# Patient Record
Sex: Male | Born: 1965
Health system: Southern US, Community
[De-identification: ages and names within clinical notes are randomized; demographics above are authoritative.]

## PROBLEM LIST (undated history)

## (undated) ENCOUNTER — Inpatient Hospital Stay: Admission: EM | Payer: Self-pay | Source: Home / Self Care

## (undated) ENCOUNTER — Emergency Department (HOSPITAL_COMMUNITY): Discharge status: Absent without leave | Payer: Medicaid Other

## (undated) DIAGNOSIS — E782 Mixed hyperlipidemia: Secondary | ICD-10-CM

## (undated) DIAGNOSIS — R233 Spontaneous ecchymoses: Secondary | ICD-10-CM

## (undated) DIAGNOSIS — E119 Type 2 diabetes mellitus without complications: Secondary | ICD-10-CM

## (undated) DIAGNOSIS — R202 Paresthesia of skin: Secondary | ICD-10-CM

## (undated) DIAGNOSIS — I5022 Chronic systolic (congestive) heart failure: Secondary | ICD-10-CM

## (undated) DIAGNOSIS — K602 Anal fissure, unspecified: Secondary | ICD-10-CM

## (undated) DIAGNOSIS — I219 Acute myocardial infarction, unspecified: Secondary | ICD-10-CM

## (undated) DIAGNOSIS — I739 Peripheral vascular disease, unspecified: Secondary | ICD-10-CM

## (undated) DIAGNOSIS — I10 Essential (primary) hypertension: Secondary | ICD-10-CM

## (undated) DIAGNOSIS — Z972 Presence of dental prosthetic device (complete) (partial): Secondary | ICD-10-CM

## (undated) DIAGNOSIS — I255 Ischemic cardiomyopathy: Secondary | ICD-10-CM

## (undated) DIAGNOSIS — I2510 Atherosclerotic heart disease of native coronary artery without angina pectoris: Secondary | ICD-10-CM

## (undated) DIAGNOSIS — K219 Gastro-esophageal reflux disease without esophagitis: Secondary | ICD-10-CM

## (undated) DIAGNOSIS — R200 Anesthesia of skin: Secondary | ICD-10-CM

## (undated) DIAGNOSIS — M1990 Unspecified osteoarthritis, unspecified site: Secondary | ICD-10-CM

## (undated) DIAGNOSIS — I429 Cardiomyopathy, unspecified: Secondary | ICD-10-CM

## (undated) DIAGNOSIS — Z9289 Personal history of other medical treatment: Secondary | ICD-10-CM

## (undated) DIAGNOSIS — K621 Rectal polyp: Secondary | ICD-10-CM

## (undated) DIAGNOSIS — I70213 Atherosclerosis of native arteries of extremities with intermittent claudication, bilateral legs: Secondary | ICD-10-CM

## (undated) DIAGNOSIS — I2109 ST elevation (STEMI) myocardial infarction involving other coronary artery of anterior wall: Secondary | ICD-10-CM

## (undated) DIAGNOSIS — I7092 Chronic total occlusion of artery of the extremities: Secondary | ICD-10-CM

## (undated) DIAGNOSIS — Z87442 Personal history of urinary calculi: Secondary | ICD-10-CM

## (undated) DIAGNOSIS — E1165 Type 2 diabetes mellitus with hyperglycemia: Secondary | ICD-10-CM

## (undated) DIAGNOSIS — R238 Other skin changes: Secondary | ICD-10-CM

## (undated) DIAGNOSIS — G458 Other transient cerebral ischemic attacks and related syndromes: Secondary | ICD-10-CM

## (undated) DIAGNOSIS — I6529 Occlusion and stenosis of unspecified carotid artery: Secondary | ICD-10-CM

## (undated) DIAGNOSIS — I200 Unstable angina: Secondary | ICD-10-CM

## (undated) HISTORY — DX: Chronic systolic (congestive) heart failure: I50.22

## (undated) HISTORY — DX: Atherosclerotic heart disease of native coronary artery without angina pectoris: I25.10

## (undated) HISTORY — DX: Essential (primary) hypertension: I10

## (undated) HISTORY — DX: Paresthesia of skin: R20.2

## (undated) HISTORY — DX: Rectal polyp: K62.1

## (undated) HISTORY — PX: APPENDECTOMY: SHX54

## (undated) HISTORY — DX: Acute myocardial infarction, unspecified: I21.9

## (undated) HISTORY — DX: Chronic total occlusion of artery of the extremities: I70.92

## (undated) HISTORY — DX: Mixed hyperlipidemia: E78.2

## (undated) HISTORY — DX: Cardiomyopathy, unspecified: I42.9

## (undated) HISTORY — DX: ST elevation (STEMI) myocardial infarction involving other coronary artery of anterior wall: I21.09

## (undated) HISTORY — DX: Anesthesia of skin: R20.0

## (undated) HISTORY — DX: Peripheral vascular disease, unspecified: I73.9

## (undated) HISTORY — PX: ANAL FISSURECTOMY: SUR608

## (undated) HISTORY — DX: Anal fissure, unspecified: K60.2

## (undated) HISTORY — PX: DIAGNOSTIC LAPAROSCOPY: SUR761

## (undated) HISTORY — DX: Occlusion and stenosis of unspecified carotid artery: I65.29

## (undated) HISTORY — DX: Atherosclerosis of native arteries of extremities with intermittent claudication, bilateral legs: I70.213

## (undated) HISTORY — PX: COLONOSCOPY: SHX174

## (undated) HISTORY — DX: Other transient cerebral ischemic attacks and related syndromes: G45.8

## (undated) HISTORY — PX: CORONARY ANGIOPLASTY: SHX604

## (undated) HISTORY — DX: Type 2 diabetes mellitus with hyperglycemia: E11.65

---

## 2002-02-19 ENCOUNTER — Emergency Department (HOSPITAL_COMMUNITY): Admission: EM | Admit: 2002-02-19 | Discharge: 2002-02-19 | Payer: Self-pay | Admitting: Internal Medicine

## 2003-06-21 ENCOUNTER — Emergency Department (HOSPITAL_COMMUNITY): Admission: EM | Admit: 2003-06-21 | Discharge: 2003-06-21 | Payer: Self-pay | Admitting: Emergency Medicine

## 2003-06-21 ENCOUNTER — Encounter: Payer: Self-pay | Admitting: Emergency Medicine

## 2007-01-06 ENCOUNTER — Emergency Department (HOSPITAL_COMMUNITY): Admission: EM | Admit: 2007-01-06 | Discharge: 2007-01-06 | Payer: Self-pay | Admitting: Emergency Medicine

## 2007-01-11 ENCOUNTER — Emergency Department (HOSPITAL_COMMUNITY): Admission: EM | Admit: 2007-01-11 | Discharge: 2007-01-12 | Payer: Self-pay | Admitting: Emergency Medicine

## 2007-01-20 ENCOUNTER — Emergency Department (HOSPITAL_COMMUNITY): Admission: EM | Admit: 2007-01-20 | Discharge: 2007-01-20 | Payer: Self-pay | Admitting: Emergency Medicine

## 2008-07-12 ENCOUNTER — Emergency Department (HOSPITAL_COMMUNITY): Admission: EM | Admit: 2008-07-12 | Discharge: 2008-07-13 | Payer: Self-pay | Admitting: Emergency Medicine

## 2008-10-06 ENCOUNTER — Emergency Department (HOSPITAL_COMMUNITY): Admission: EM | Admit: 2008-10-06 | Discharge: 2008-10-06 | Payer: Self-pay | Admitting: Emergency Medicine

## 2010-05-24 ENCOUNTER — Emergency Department (HOSPITAL_COMMUNITY): Admission: EM | Admit: 2010-05-24 | Discharge: 2010-05-24 | Payer: Self-pay | Admitting: Emergency Medicine

## 2010-07-08 ENCOUNTER — Emergency Department (HOSPITAL_COMMUNITY): Admission: EM | Admit: 2010-07-08 | Discharge: 2010-07-08 | Payer: Self-pay | Admitting: Emergency Medicine

## 2010-07-21 ENCOUNTER — Ambulatory Visit: Payer: Self-pay | Admitting: Internal Medicine

## 2010-07-22 ENCOUNTER — Encounter: Payer: Self-pay | Admitting: Internal Medicine

## 2010-07-25 ENCOUNTER — Ambulatory Visit (HOSPITAL_COMMUNITY): Admission: RE | Admit: 2010-07-25 | Discharge: 2010-07-25 | Payer: Self-pay | Admitting: Internal Medicine

## 2010-07-25 ENCOUNTER — Ambulatory Visit: Payer: Self-pay | Admitting: Internal Medicine

## 2010-07-27 ENCOUNTER — Telehealth (INDEPENDENT_AMBULATORY_CARE_PROVIDER_SITE_OTHER): Payer: Self-pay

## 2010-07-29 ENCOUNTER — Encounter: Payer: Self-pay | Admitting: Internal Medicine

## 2010-08-03 ENCOUNTER — Encounter: Payer: Self-pay | Admitting: Internal Medicine

## 2010-08-05 ENCOUNTER — Ambulatory Visit (HOSPITAL_COMMUNITY): Admission: RE | Admit: 2010-08-05 | Discharge: 2010-08-05 | Payer: Self-pay | Admitting: General Surgery

## 2010-08-24 ENCOUNTER — Encounter: Payer: Self-pay | Admitting: Internal Medicine

## 2011-01-03 NOTE — Letter (Signed)
Summary: HISTORY & PHYSICAL  HISTORY & PHYSICAL   Imported By: Rexene Alberts 08/03/2010 15:05:09  _____________________________________________________________________  External Attachment:    Type:   Image     Comment:   External Document

## 2011-01-03 NOTE — Assessment & Plan Note (Signed)
Summary: RECTAL BLEEDING & HEMORRHOIDS/LAW   Visit Type:  Initial Consult Referring Provider:  Dimas Aguas Primary Care Provider:  [facility]  Chief Complaint:  rectal bleeding/hemorrhoids.  History of Present Illness: Mr. Ryan [name] is a pleasant 45 year old Caucasian gentleman, who presents today at request of [facility]. [facility], for further evaluation of rectal bleeding and hemorrhoids.  C/O severe anorectal pain for 6 months. Starts daily after have BM. Pain lasts all day long. Hard to sit down. Been to ED. Tried several creams that don't help. Has taken stool softners. Never has constipation. BM every AM. C/O tenesmus. No abd pain. Pain so severe feels nauseated. No vomiting. No heartburn. No dysphagia. Appetite good. Has been on prednisone for back. On diclofenac for awhile. Was on vicodin for pain but now out. Tried lidocaine/hydrocortisone without improvement. Takes 3-4 Sitz baths daily without relief.  DRE at HD showed hemorrhoids.  Current Medications (verified): 1)  Prednisone .... As Directed 2)  Diclofenac Sodium 75 Mg Tbec (Diclofenac Sodium) .... Take 1 Tablet By Mouth Two Times A Day 3)  Tramadol Hcl 50 Mg Tabs (Tramadol Hcl) .... Two Tablets Two To Three Times A Day 4)  Zanaflex 4 Mg Tabs (Tizanidine Hcl) .... Take 1 Tablet By Mouth Two Times A Day  Allergies (verified): No Known Drug Allergies  Past History:  Past Medical History: Chronic back pain  Past Surgical History: Appendectomy  Family History: Father, age 63, deceased lung cancer Maternal uncle, colon cancer, in 102s No FH liver disease.  Social History: Divorced. One child. Unemployed. 3/4ppd. Occasional alcohol. No drugs.   Review of Systems General:  Denies fever, chills, sweats, anorexia, fatigue, weakness, and weight loss. Eyes:  Denies vision loss. ENT:  Denies nasal congestion, sore throat, hoarseness, and difficulty swallowing. CV:  Denies  chest pains, angina, palpitations, dyspnea on exertion, and peripheral edema. Resp:  Denies dyspnea at rest, dyspnea with exercise, cough, sputum, and wheezing. GI:  See HPI. GU:  Denies urinary burning and blood in urine. MS:  Complains of low back pain. Derm:  Denies rash and itching. Neuro:  Denies weakness, frequent headaches, memory loss, and confusion. Psych:  Denies depression and anxiety. Endo:  Denies unusual weight change. Heme:  Denies bruising and bleeding. Allergy:  Denies hives and rash.  Vital Signs:  Patient profile:   45 year old male Height:      69 inches Weight:      200 pounds BMI:     29.64 Temp:     98.8 degrees F oral Pulse rate:   72 / minute BP sitting:   122 / 80  (left arm) Cuff size:   regular  Vitals Entered By: Cloria Spring LPN (July 21, 2010 2:00 PM)  Physical Exam  General:  Well developed, well nourished, no acute distress. Head:  Normocephalic and atraumatic. Eyes:  Conjunctivae pink, no scleral icterus.  Mouth:  Oropharyngeal mucosa moist, pink.  No lesions, erythema or exudate.    Neck:  Supple; no masses or thyromegaly. Lungs:  Clear throughout to auscultation. Heart:  Regular rate and rhythm; no murmurs, rubs,  or bruits. Abdomen:  Bowel sounds normal.  Abdomen is soft, nontender, nondistended.  No rebound or guarding.  No hepatosplenomegaly, masses or hernias.  No abdominal bruits.  Rectal:  deferred until time of colonoscopy.   Extremities:  No clubbing, cyanosis, edema or deformities noted. Neurologic:  Alert and  oriented x4;  grossly normal neurologically. Skin:  Intact without significant  lesions or rashes. Cervical Nodes:  No significant cervical adenopathy. Psych:  Alert and cooperative. Normal mood and affect.  Impression & Recommendations:  Problem # 1:  RECTAL PAIN (UVO-536.64)  Rectal pain associated with hematachezia for last six months. Suspect anorectal fissure. Other etiologies need to be excluded. Colonoscopy to  be performed in near future.  Risks, alternatives, and benefits including but not limited to the risk of reaction to medication, bleeding, infection, and perforation were addressed.  Patient voiced understanding and provided verbal consent.   Orders: Consultation Level III (40347) I would like to thank Dr. [facility] for allowing [place] to take part in the care of this nice patient.

## 2011-01-03 NOTE — Letter (Signed)
Summary: OFFICE NOTES FROM [facility]  OFFICE NOTES FROM [facility]   Imported By: Rexene Alberts 08/24/2010 15:33:54  _____________________________________________________________________  External Attachment:    Type:   Image     Comment:   External Document

## 2011-01-03 NOTE — Letter (Signed)
Summary: Internal Other /Orders for TCS  Internal Other /Orders for TCS   Imported By: Cloria Spring LPN 98/10/9146 82:95:62  _____________________________________________________________________  External Attachment:    Type:   Image     Comment:   External Document

## 2011-01-03 NOTE — Letter (Signed)
Summary: Patient Notice, Colon Biopsy Results  [facility]  [address]   [place], [place] [address]   Phone: [phone]  Fax: [phone]       July 29, 2010   Ryan [name] [address] [place], [place]  [address] 01-Jun-1966    Dear [name],  I am pleased to inform you that the biopsies taken during your recent colonoscopy did not show any evidence of cancer upon pathologic examination.  Additional information/recommendations:  Continue with the treatment plan as outlined on the day of your exam.  You should have a repeat colonoscopy examination  in 10 years.  Please call us if you are having persistent problems or have questions about your condition that have not been fully answered at this time.  Sincerely,    R. Roetta Sessions MD, FACP [facility] Ph: [phone]    Fax: [phone]   Appended Document: Patient Notice, Colon Biopsy Results letter mailed to pt  Appended Document: Patient Notice, Colon Biopsy Results reminder in computer

## 2011-01-03 NOTE — Progress Notes (Signed)
Summary: change ana-mantle  Phone Note From Pharmacy   Caller: [facility] Summary of Call: pharmacy called- they are not making ana-mantle and they need an alternative. please advise Initial call taken by: Hendricks Limes LPN,  July 27, 2010 1:02 PM     Appended Document: change ana-mantle any generic lidocaine /hydrocortisone cream will be sufficient.  Appended Document: change ana-mantle left message for pharmacy

## 2011-02-17 LAB — CBC
HCT: 43.2 % (ref 39.0–52.0)
Hemoglobin: 14.8 g/dL (ref 13.0–17.0)
MCHC: 34.3 g/dL (ref 30.0–36.0)
MCV: 96.2 fL (ref 78.0–100.0)
RBC: 4.49 MIL/uL (ref 4.22–5.81)
WBC: 8.3 10*3/uL (ref 4.0–10.5)

## 2011-02-17 LAB — SURGICAL PCR SCREEN
MRSA, PCR: NEGATIVE
Staphylococcus aureus: NEGATIVE

## 2011-02-17 LAB — BASIC METABOLIC PANEL
GFR calc Af Amer: >60 mL/min (ref >60)
GFR calc non Af Amer: >60 mL/min (ref >60)
Glucose, Bld: 146 mg/dL — ABNORMAL HIGH (ref 70–99)
Potassium: 4 mEq/L (ref 3.5–5.1)
Sodium: 136 mEq/L (ref 135–145)

## 2011-12-12 ENCOUNTER — Inpatient Hospital Stay (HOSPITAL_COMMUNITY)
Admission: EM | Admit: 2011-12-12 | Discharge: 2011-12-16 | DRG: 246 | Disposition: A | Discharge status: Home / Self Care | Payer: Medicaid Other | Source: Ambulatory Visit | Attending: Cardiovascular Disease | Admitting: Cardiovascular Disease

## 2011-12-12 ENCOUNTER — Other Ambulatory Visit: Payer: Self-pay

## 2011-12-12 ENCOUNTER — Encounter (HOSPITAL_COMMUNITY)
Admission: EM | Disposition: A | Discharge status: Home / Self Care | Payer: Self-pay | Source: Ambulatory Visit | Attending: Cardiovascular Disease

## 2011-12-12 DIAGNOSIS — I214 Non-ST elevation (NSTEMI) myocardial infarction: Principal | ICD-10-CM | POA: Diagnosis present

## 2011-12-12 DIAGNOSIS — I4901 Ventricular fibrillation: Secondary | ICD-10-CM | POA: Diagnosis present

## 2011-12-12 DIAGNOSIS — I2510 Atherosclerotic heart disease of native coronary artery without angina pectoris: Secondary | ICD-10-CM | POA: Insufficient documentation

## 2011-12-12 DIAGNOSIS — Z720 Tobacco use: Secondary | ICD-10-CM | POA: Insufficient documentation

## 2011-12-12 DIAGNOSIS — I2589 Other forms of chronic ischemic heart disease: Secondary | ICD-10-CM | POA: Diagnosis present

## 2011-12-12 DIAGNOSIS — I472 Ventricular tachycardia, unspecified: Secondary | ICD-10-CM | POA: Diagnosis present

## 2011-12-12 DIAGNOSIS — F17200 Nicotine dependence, unspecified, uncomplicated: Secondary | ICD-10-CM | POA: Diagnosis present

## 2011-12-12 DIAGNOSIS — E782 Mixed hyperlipidemia: Secondary | ICD-10-CM

## 2011-12-12 DIAGNOSIS — I2582 Chronic total occlusion of coronary artery: Secondary | ICD-10-CM | POA: Diagnosis present

## 2011-12-12 DIAGNOSIS — I2109 ST elevation (STEMI) myocardial infarction involving other coronary artery of anterior wall: Secondary | ICD-10-CM

## 2011-12-12 DIAGNOSIS — E785 Hyperlipidemia, unspecified: Secondary | ICD-10-CM | POA: Diagnosis present

## 2011-12-12 DIAGNOSIS — E118 Type 2 diabetes mellitus with unspecified complications: Secondary | ICD-10-CM

## 2011-12-12 DIAGNOSIS — I4729 Other ventricular tachycardia: Secondary | ICD-10-CM | POA: Diagnosis present

## 2011-12-12 DIAGNOSIS — I469 Cardiac arrest, cause unspecified: Secondary | ICD-10-CM | POA: Diagnosis present

## 2011-12-12 DIAGNOSIS — E876 Hypokalemia: Secondary | ICD-10-CM | POA: Diagnosis present

## 2011-12-12 DIAGNOSIS — I213 ST elevation (STEMI) myocardial infarction of unspecified site: Secondary | ICD-10-CM

## 2011-12-12 DIAGNOSIS — E1169 Type 2 diabetes mellitus with other specified complication: Secondary | ICD-10-CM

## 2011-12-12 DIAGNOSIS — I519 Heart disease, unspecified: Secondary | ICD-10-CM

## 2011-12-12 HISTORY — PX: LEFT HEART CATHETERIZATION WITH CORONARY ANGIOGRAM: SHX5451

## 2011-12-12 HISTORY — DX: Atherosclerotic heart disease of native coronary artery without angina pectoris: I25.10

## 2011-12-12 HISTORY — DX: Gastro-esophageal reflux disease without esophagitis: K21.9

## 2011-12-12 HISTORY — DX: ST elevation (STEMI) myocardial infarction involving other coronary artery of anterior wall: I21.09

## 2011-12-12 HISTORY — PX: PERCUTANEOUS CORONARY STENT INTERVENTION (PCI-S): SHX5485

## 2011-12-12 LAB — CBC
MCH: 33.8 pg (ref 26.0–34.0)
MCHC: 35.5 g/dL (ref 30.0–36.0)
MCV: 95.2 fL (ref 78.0–100.0)
Platelets: 277 10*3/uL (ref 150–400)
RDW: 12.9 % (ref 11.5–15.5)

## 2011-12-12 LAB — CARDIAC PANEL(CRET KIN+CKTOT+MB+TROPI)
CK, MB: 156.9 ng/mL (ref 0.3–4.0)
Troponin I: >25 ng/mL (ref <0.30)

## 2011-12-12 LAB — POCT I-STAT, CHEM 8
Calcium, Ion: 1.21 mmol/L (ref 1.12–1.32)
HCT: 50 % (ref 39.0–52.0)
TCO2: 28 mmol/L (ref 0–100)

## 2011-12-12 LAB — TROPONIN I: Troponin I: <0.3 ng/mL (ref <0.30)

## 2011-12-12 LAB — BASIC METABOLIC PANEL
BUN: 13 mg/dL (ref 6–23)
CO2: 25 mEq/L (ref 19–32)
Calcium: 11.4 mg/dL — ABNORMAL HIGH (ref 8.4–10.5)
Chloride: 101 mEq/L (ref 96–112)
Creatinine, Ser: 1.08 mg/dL (ref 0.50–1.35)
GFR calc Af Amer: >90 mL/min (ref >90)
Glucose, Bld: 119 mg/dL — ABNORMAL HIGH (ref 70–99)
Potassium: 3.9 mEq/L (ref 3.5–5.1)
Sodium: 138 mEq/L (ref 135–145)

## 2011-12-12 LAB — DIFFERENTIAL
Eosinophils Absolute: 0.4 10*3/uL (ref 0.0–0.7)
Lymphs Abs: 4.5 10*3/uL — ABNORMAL HIGH (ref 0.7–4.0)
Monocytes Absolute: 0.8 10*3/uL (ref 0.1–1.0)
Neutrophils Relative %: 45 % (ref 43–77)

## 2011-12-12 LAB — POCT ACTIVATED CLOTTING TIME: Activated Clotting Time: 303 seconds

## 2011-12-12 LAB — POCT I-STAT TROPONIN I: Troponin i, poc: 0.06 ng/mL (ref 0.00–0.08)

## 2011-12-12 LAB — APTT: aPTT: 26 seconds (ref 24–37)

## 2011-12-12 LAB — GLUCOSE, CAPILLARY: Glucose-Capillary: 210 mg/dL — ABNORMAL HIGH (ref 70–99)

## 2011-12-12 LAB — MAGNESIUM: Magnesium: 1.7 mg/dL (ref 1.5–2.5)

## 2011-12-12 SURGERY — LEFT HEART CATHETERIZATION WITH CORONARY ANGIOGRAM
Anesthesia: LOCAL | Site: Hand | Laterality: Right

## 2011-12-12 MED ORDER — SODIUM CHLORIDE 0.9 % IV SOLN: INTRAVENOUS | Status: AC

## 2011-12-12 MED ORDER — PANTOPRAZOLE SODIUM 40 MG PO TBEC: 40.0000 mg | DELAYED_RELEASE_TABLET | Freq: Every day | ORAL | Status: DC

## 2011-12-12 MED ORDER — ONDANSETRON HCL 4 MG/2ML IJ SOLN
4.0000 mg | Freq: Four times a day (QID) | INTRAMUSCULAR | Status: DC | PRN
  Administered 2011-12-12 – 2011-12-13 (×2): 4 mg via INTRAVENOUS
  Filled 2011-12-12 (×2): qty 2

## 2011-12-12 MED ORDER — MORPHINE SULFATE 4 MG/ML IJ SOLN
INTRAMUSCULAR | Status: AC
  Administered 2011-12-12: 14:00:00
  Filled 2011-12-12: qty 1

## 2011-12-12 MED ORDER — ACETAMINOPHEN 325 MG PO TABS: 650.0000 mg | ORAL_TABLET | ORAL | Status: DC | PRN

## 2011-12-12 MED ORDER — EPINEPHRINE HCL 0.1 MG/ML IJ SOLN
INTRAMUSCULAR | Status: AC | PRN
  Administered 2011-12-12: 1 mg via INTRAVENOUS

## 2011-12-12 MED ORDER — NITROGLYCERIN 0.4 MG SL SUBL: 0.4000 mg | SUBLINGUAL_TABLET | SUBLINGUAL | Status: DC | PRN

## 2011-12-12 MED ORDER — POTASSIUM CHLORIDE CRYS ER 20 MEQ PO TBCR
40.0000 meq | EXTENDED_RELEASE_TABLET | Freq: Once | ORAL | Status: AC
  Administered 2011-12-12: 40 meq via ORAL
  Filled 2011-12-12: qty 2

## 2011-12-12 MED ORDER — METOPROLOL TARTRATE 25 MG PO TABS
25.0000 mg | ORAL_TABLET | Freq: Two times a day (BID) | ORAL | Status: DC
  Administered 2011-12-12: 25 mg via ORAL
  Filled 2011-12-12 (×3): qty 1

## 2011-12-12 MED ORDER — SODIUM CHLORIDE 0.9 % IV SOLN: 250.0000 mL | INTRAVENOUS | Status: DC | PRN

## 2011-12-12 MED ORDER — PANTOPRAZOLE SODIUM 40 MG PO TBEC
40.0000 mg | DELAYED_RELEASE_TABLET | Freq: Every day | ORAL | Status: AC
  Administered 2011-12-12: 40 mg via ORAL
  Filled 2011-12-12: qty 1

## 2011-12-12 MED ORDER — CARVEDILOL 3.125 MG PO TABS
3.1250 mg | ORAL_TABLET | Freq: Two times a day (BID) | ORAL | Status: DC
  Filled 2011-12-12: qty 1

## 2011-12-12 MED ORDER — PRASUGREL HCL 10 MG PO TABS
10.0000 mg | ORAL_TABLET | Freq: Every day | ORAL | Status: DC
  Administered 2011-12-13 – 2011-12-14 (×2): 10 mg via ORAL
  Filled 2011-12-12 (×2): qty 1

## 2011-12-12 MED ORDER — ACETAMINOPHEN 325 MG PO TABS
650.0000 mg | ORAL_TABLET | ORAL | Status: DC | PRN
  Administered 2011-12-15: 650 mg via ORAL
  Filled 2011-12-12: qty 2

## 2011-12-12 MED ORDER — ZOLPIDEM TARTRATE 5 MG PO TABS: 10.0000 mg | ORAL_TABLET | Freq: Every evening | ORAL | Status: DC | PRN

## 2011-12-12 MED ORDER — ZOLPIDEM TARTRATE 5 MG PO TABS
10.0000 mg | ORAL_TABLET | Freq: Every evening | ORAL | Status: DC | PRN
Start: 2011-12-12 — End: 2011-12-16

## 2011-12-12 MED ORDER — ASPIRIN 81 MG PO CHEW
CHEWABLE_TABLET | ORAL | Status: AC
  Administered 2011-12-12: 162 mg
  Filled 2011-12-12: qty 2

## 2011-12-12 MED ORDER — OXYCODONE-ACETAMINOPHEN 5-325 MG PO TABS
1.0000 | ORAL_TABLET | ORAL | Status: DC | PRN
  Administered 2011-12-12 – 2011-12-14 (×8): 2 via ORAL
  Filled 2011-12-12 (×8): qty 2

## 2011-12-12 MED ORDER — SODIUM CHLORIDE 0.9 % IJ SOLN: 3.0000 mL | INTRAMUSCULAR | Status: DC | PRN

## 2011-12-12 MED ORDER — CAPTOPRIL 6.25 MG HALF TABLET
6.2500 mg | ORAL_TABLET | Freq: Three times a day (TID) | ORAL | Status: DC
  Administered 2011-12-12: 6.25 mg via ORAL
  Filled 2011-12-12 (×4): qty 1

## 2011-12-12 MED ORDER — NITROGLYCERIN IN D5W 200-5 MCG/ML-% IV SOLN
INTRAVENOUS | Status: AC
  Administered 2011-12-12: 16:00:00
  Filled 2011-12-12: qty 250

## 2011-12-12 MED ORDER — ALPRAZOLAM 0.25 MG PO TABS
0.2500 mg | ORAL_TABLET | Freq: Two times a day (BID) | ORAL | Status: DC | PRN
  Administered 2011-12-13 (×2): 0.25 mg via ORAL
  Filled 2011-12-12 (×2): qty 1

## 2011-12-12 MED ORDER — DEXTROSE 5 % IV SOLN
200.0000 mg | INTRAVENOUS | Status: AC | PRN
  Administered 2011-12-12: 200 mg via INTRAVENOUS

## 2011-12-12 MED ORDER — SODIUM CHLORIDE 0.9 % IJ SOLN
3.0000 mL | Freq: Two times a day (BID) | INTRAMUSCULAR | Status: DC
  Administered 2011-12-12 – 2011-12-16 (×8): 3 mL via INTRAVENOUS

## 2011-12-12 MED ORDER — SODIUM CHLORIDE 0.9 % IV SOLN
0.2500 mg/kg/h | INTRAVENOUS | Status: AC
  Filled 2011-12-12: qty 250

## 2011-12-12 MED ORDER — ROSUVASTATIN CALCIUM 40 MG PO TABS
40.0000 mg | ORAL_TABLET | Freq: Every day | ORAL | Status: DC
  Administered 2011-12-13 – 2011-12-14 (×2): 40 mg via ORAL
  Filled 2011-12-12 (×2): qty 1

## 2011-12-12 MED ORDER — LISINOPRIL 5 MG PO TABS
5.0000 mg | ORAL_TABLET | Freq: Every day | ORAL | Status: DC
  Administered 2011-12-12: 5 mg via ORAL
  Filled 2011-12-12 (×2): qty 1

## 2011-12-12 MED ORDER — ASPIRIN 81 MG PO CHEW
81.0000 mg | CHEWABLE_TABLET | Freq: Every day | ORAL | Status: DC
  Administered 2011-12-12 – 2011-12-14 (×3): 81 mg via ORAL
  Filled 2011-12-12 (×3): qty 1

## 2011-12-12 MED ORDER — ASPIRIN EC 81 MG PO TBEC: 81.0000 mg | DELAYED_RELEASE_TABLET | Freq: Every day | ORAL | Status: DC

## 2011-12-12 MED ORDER — ROSUVASTATIN CALCIUM 40 MG PO TABS
40.0000 mg | ORAL_TABLET | Freq: Every day | ORAL | Status: DC
  Filled 2011-12-12: qty 1

## 2011-12-12 MED ORDER — ONDANSETRON HCL 4 MG/2ML IJ SOLN: 4.0000 mg | Freq: Four times a day (QID) | INTRAMUSCULAR | Status: DC | PRN

## 2011-12-12 NOTE — H&P (Signed)
Patient ID: Ryan [name] MRN: [id number], DOB/AGE: 46-Dec-1967   Admit date: 12/12/2011   Primary Cardiologist: New  Pt. Profile:   46 y/o male w/o prior cardiac hx who presented on transfer from [facility] with acute Ant STEMI.  Problem List: Past Medical History  Diagnosis Date  . CAD (coronary artery disease)     A. Acute Ant STEMI 12/12/2011  . Tobacco abuse     approx 25-50 pack years  . Torsade de pointes     A. 12/17/2011 in setting of Acute Ant Stemi     Allergies: No Known Allergies  HPI:   46 y/o w/o prior medical hx who over the past month has been experiencing exertional c/p, most days of the week, lasting 10-15 mins, and resolving with rest.  Pt works night shift and awoke from sleep this afternoon at 3pm with severe sscp and left arm pain with sob and diaphoresis.  His girlfriend took him to [facility] ER, where he was found to have Ant ST elevation.  Code STEMI was called and arrangements were made for transfer to [facility] for emergent cath.  While still in ER, pt had VT/Torsades, and was treated 200mg  of IV amiodarone and defibrillated, back into sinus rhythm.  Pt was then taken via carelink to the [facility] cath lab where he cont to have 10/10 c/p and sob.   Home Medications Medications Prior to Admission  Medication Dose Route Frequency Provider Last Rate Last Dose  . amiodarone (CORDARONE) 200 mg in dextrose 5 % 100 mL bolus  200 mg Intravenous PRN Flint Melter, MD   200 mg at 12/12/11 1543  . aspirin 81 MG chewable tablet        162 mg at 12/12/11 1601  . EPINEPHrine (ADRENALIN) 0.1 MG/ML injection   Intravenous PRN Flint Melter, MD   1 mg at 12/12/11 1540  . morphine 4 MG/ML injection           . nitroGLYCERIN 0.2 mg/mL in dextrose 5 % infusion            No current outpatient prescriptions on file as of 12/12/2011.     FH Unable to obtain complete family history 2/2 acuity.  History   Social History  . Marital Status: Divorced    Spouse Name: N/A    Number of  Children: N/A  . Years of Education: N/A   Occupational History  .  Other   Social History Main Topics  . Smoking status: Current Everyday Smoker -- 1.5 packs/day for 25 years    Types: Cigarettes  . Smokeless tobacco: Never Used  . Alcohol Use: Yes     drinks a few beers on weekends.  . Drug Use: No  . Sexually Active: Not on file   Other Topics Concern  . Not on file   Social History Narrative   [employer] - physical labor  - works night shift     Review of Systems: General: negative for chills, fever, night sweats or weight changes.  Cardiovascular+++ for chest pain, dyspnea on exertion, and diaphoresis.  No edema, orthopnea, palpitations, paroxysmal nocturnal dyspnea. Dermatological: negative for rash Respiratory: negative for cough or wheezing Urologic: negative for hematuria Abdominal: negative for nausea, vomiting, diarrhea, bright red blood per rectum, melena, or hematemesis Neurologic: negative for visual changes, syncope, or dizziness All other systems reviewed and are otherwise negative except as noted above.  Physical Exam: Blood pressure 197/102, pulse 27, temperature 98.6 F (37  C), temperature source Oral, resp. rate 15, SpO2 93.00%.  General: Well developed, well nourished, in no acute distress. Head: Normocephalic, atraumatic, sclera non-icteric, no xanthomas, nares are without discharge.  Neck: Supple without bruits or JVD. Lungs:  Resp regular and unlabored, CTA. Heart: RRR no s3, s4, or murmurs. Abdomen: Soft, non-tender, non-distended, BS + x 4.  Msk:  Strength and tone appears normal for age. Extremities: No clubbing, cyanosis or edema. DP/PT/Radials 1+ and equal bilaterally. Neuro: Alert and oriented X 3. Moves all extremities spontaneously. Psych: Normal affect.   Labs:   Results for orders placed during the hospital encounter of 12/12/11 (from the past 72 hour(s))  POCT I-STAT TROPONIN I     Status: Normal   Collection Time    12/12/11  3:48 PM      Component Value Range Comment   Troponin i, poc 0.06  0.00 - 0.08 (ng/mL)    Comment 3            CBC     Status: Abnormal   Collection Time   12/12/11  3:50 PM      Component Value Range Comment   WBC 10.6 (*) 4.0 - 10.5 (K/uL)    RBC 5.18  4.22 - 5.81 (MIL/uL)    Hemoglobin 17.5 (*) 13.0 - 17.0 (g/dL)    HCT 16.1  09.6 - 04.5 (%)    MCV 95.2  78.0 - 100.0 (fL)    MCH 33.8  26.0 - 34.0 (pg)    MCHC 35.5  30.0 - 36.0 (g/dL)    RDW 40.9  81.1 - 91.4 (%)    Platelets 277  150 - 400 (K/uL)   DIFFERENTIAL     Status: Normal (Preliminary result)   Collection Time   12/12/11  3:50 PM      Component Value Range Comment   Neutrophils Relative PENDING  43 - 77 (%)    Neutro Abs PENDING  1.7 - 7.7 (K/uL)    Band Neutrophils PENDING  0 - 10 (%)    Lymphocytes Relative PENDING  12 - 46 (%)    Lymphs Abs PENDING  0.7 - 4.0 (K/uL)    Monocytes Relative PENDING  3 - 12 (%)    Monocytes Absolute PENDING  0.1 - 1.0 (K/uL)    Eosinophils Relative PENDING  0 - 5 (%)    Eosinophils Absolute PENDING  0.0 - 0.7 (K/uL)    Basophils Relative PENDING  0 - 1 (%)    Basophils Absolute PENDING  0.0 - 0.1 (K/uL)    WBC Morphology PENDING      RBC Morphology PENDING      Smear Review PENDING      nRBC PENDING  0 (/100 WBC)    Metamyelocytes Relative PENDING      Myelocytes PENDING      Promyelocytes Absolute PENDING      Blasts PENDING     BASIC METABOLIC PANEL     Status: Abnormal   Collection Time   12/12/11  3:50 PM      Component Value Range Comment   Sodium 142  135 - 145 (mEq/L)    Potassium 3.3 (*) 3.5 - 5.1 (mEq/L)    Chloride 99  96 - 112 (mEq/L)    CO2 30  19 - 32 (mEq/L)    Glucose, Bld 136 (*) 70 - 99 (mg/dL)    BUN 13  6 - 23 (mg/dL)    Creatinine, Ser 7.82  0.50 - 1.35 (mg/dL)  Calcium 11.4 (*) 8.4 - 10.5 (mg/dL)    GFR calc non Af Amer 81 (*) >90 (mL/min)    GFR calc Af Amer >90  >90 (mL/min)   TROPONIN I     Status: Normal   Collection Time   12/12/11  3:50 PM       Component Value Range Comment   Troponin I <0.30  <0.30 (ng/mL)   PROTIME-INR     Status: Normal   Collection Time   12/12/11  3:50 PM      Component Value Range Comment   Prothrombin Time 12.4  11.6 - 15.2 (seconds)    INR 0.91  0.00 - 1.49    APTT     Status: Normal   Collection Time   12/12/11  3:50 PM      Component Value Range Comment   aPTT 26  24 - 37 (seconds)      Radiology/Studies: No results found.  EKG:  Sinus Tach with 3-77mm ant st elevation and inferolateral st depression.  ASSESSMENT AND PLAN:   1.  Acute Ant STEMI/CAD:  Emergent cath for ongoing chest pain and ST elevation.  Plan to add asa, bb, acei, p2y12 inhibitor, and high dose statin.  Eventual cardiac rehab.  2.  Tobacco Abuse:  Eventual Cessation counseling.  3.  Torsade [place]:  In setting of above.  Revascularization ongoing.  Pt hypokalemic - supp.  Check Mg.  4.  Hypokalemia:  Supp.  Check Mg.   Signed, Nicolasa Ducking, NP 12/12/2011, 4:56 PM  I have personally seen and examined this patient. I agree with the assessment and plan as outlined above. Pt presented as acute anterior STEMI. Emergent cath. Further plans to follow.   Sehaj Mcenroe 6:04 PM 12/12/2011

## 2011-12-12 NOTE — Progress Notes (Signed)
eLink Physician-Brief Progress Note Patient Name: Ryan [name] DOB: 10-12-1966 MRN: [id number]  Date of Service  12/12/2011   HPI/Events of Note     eICU Interventions  Protonix for indigestion , takes tums at home   Intervention Category Minor Interventions: Routine modifications to care plan (e.g. PRN medications for pain, fever)  Ryan [name] V. 12/12/2011, 9:37 PM

## 2011-12-12 NOTE — ED Provider Notes (Signed)
History     CSN: [id number]  Arrival date & time 12/12/11  1524   None     Chief Complaint  Patient presents with  . Chest Pain   Level 5 Caveat- Pt coded during the ED history   (Consider location/radiation/quality/duration/timing/severity/associated sxs/prior treatment) Patient is a 46 y.o. male presenting with chest pain. The history is provided by the patient.  Chest Pain    7:44 PM Pt doesn't have htn. No allergies. Has had pain since 3:00, pain is rated a 6 out of 10. Sharp pain in right arm and chest. Took 2 baby aspirin pta. Had intermittent pain for the past week. At this point pt became unresponsive and a code was done. Code STEMI called at the bedside Past Medical History  Diagnosis Date  . CAD (coronary artery disease)     A. Acute Ant STEMI 12/12/2011  . Tobacco abuse     approx 25-50 pack years  . Torsade de pointes     A. 12/17/2011 in setting of Acute Ant Stemi    History reviewed. No pertinent past surgical history.  No family history on file.  History  Substance Use Topics  . Smoking status: Current Everyday Smoker -- 1.5 packs/day for 25 years    Types: Cigarettes  . Smokeless tobacco: Never Used  . Alcohol Use: Yes     drinks a few beers on weekends.      Review of Systems  Unable to perform ROS Cardiovascular: Positive for chest pain.    Allergies  Review of patient's allergies indicates no known allergies.  Home Medications  No current outpatient prescriptions on file.  BP 197/102  Pulse 120  Temp(Src) 98.6 F (37 C) (Oral)  Resp 15  Wt 198 lb 6.6 oz (90 kg)  SpO2 93%  Physical Exam  Constitutional: He is oriented to person, place, and time. He appears well-developed and well-nourished.  HENT:  Head: Normocephalic and atraumatic.  Eyes: Conjunctivae and EOM are normal. Pupils are equal, round, and reactive to light.  Cardiovascular:       tachycardic  Pulmonary/Chest: Effort normal.  Abdominal: Soft. There is no tenderness.   Musculoskeletal: Normal range of motion.  Neurological: He is alert and oriented to person, place, and time. No cranial nerve deficit. Coordination normal.  Skin: Skin is warm and dry.  Psychiatric: He has a normal mood and affect. His behavior is normal. Judgment and thought content normal.    ED Course  Procedures (including critical care time) seized while giving pt history went into vtach CPR started 3:38 3:40 PM first shock 3:42 PM second shock needs intubation Sinus tach Care link here arranging transport to [facility] Cath Lab 3:58 PM CRITICAL CARE Performed by: Flint Melter   Total critical care time: 45 min  Critical care time was exclusive of separately billable procedures and treating other patients.  Critical care was necessary to treat or prevent imminent or life-threatening deterioration.  Critical care was time spent personally by me on the following activities: development of treatment plan with patient and/or surrogate as well as nursing, discussions with consultants, evaluation of patient's response to treatment, examination of patient, obtaining history from patient or surrogate, ordering and performing treatments and interventions, ordering and review of laboratory studies, ordering and review of radiographic studies, pulse oximetry and re-evaluation of patient's condition.  CODE BLUE: During history patient became unresponsive, and appeared to be seizing by drawing his extremities toward his face. He did not have a pulse at that  time. Cardiac monitor showed ventricular tachycardia, which, which degenerated into torsades. Chest compressions begun by me. Preparations made to give oxygen by face mask. External pacing./Shock pads applied. Patient given unsynchronized defibrillation. Subsequent to that he appeared to have a tachycardic response. Pulse could not be obtained but the patient began vomiting and controlled his airway with his eyes open. He then became unresponsive  again and was seen to be back in torsades. A second defibrillation was given. His IV was lost during the vomiting. A second IV was obtained and he was given amiodarone 200 mg IV push. After about 90 seconds the patient became alert and responsive and was noted to have normal mental status. Patient related that he had taken 2 aspirin before leaving home. He was given 2 more 81 mg aspirins to chew. During the code he also received epinephrine 1 mg. Code. STEMI was called because of chest pain with cardiac arrhythmia. Preparations made to send the patient to come cardiac catheterization lab. I discussed the case with the on-call cardiologist there.   Date: 12/12/2011  Rate: 112  Rhythm: sinus tachycardia  QRS Axis: normal  Intervals: normal  ST/T Wave abnormalities: ST elevations anteriorly and ST depressions inferiorly  Conduction Disutrbances:none  Narrative Interpretation:   Old EKG Reviewed: none available      Labs Reviewed  CBC - Abnormal; Notable for the following:    WBC 10.6 (*)    Hemoglobin 17.5 (*)    All other components within normal limits  DIFFERENTIAL - Abnormal; Notable for the following:    Lymphs Abs 4.5 (*)    All other components within normal limits  BASIC METABOLIC PANEL - Abnormal; Notable for the following:    Potassium 3.3 (*)    Glucose, Bld 136 (*)    Calcium 11.4 (*)    GFR calc non Af Amer 81 (*)    All other components within normal limits  POCT I-STAT, CHEM 8 - Abnormal; Notable for the following:    Glucose, Bld 195 (*)    All other components within normal limits  GLUCOSE, CAPILLARY - Abnormal; Notable for the following:    Glucose-Capillary 210 (*)    All other components within normal limits  POCT I-STAT TROPONIN I  TROPONIN I  PROTIME-INR  APTT  POCT ACTIVATED CLOTTING TIME   Prior to discharge , aspirin and an IV nitroglycerin drip were ordered.  1. STEMI (ST elevation myocardial infarction)   2. Cardiac arrest - ventricular  fibrillation   3. Chest pain       MDM  STEMI with cardiac arrest, and unstable ventricular rhythm. Patient treated urgently in the ED and stabilized for transfer to the catheterization lab. He left the emergency department, and hypertensive status, alert, and conversant.        Flint Melter, MD 12/12/11 1949

## 2011-12-12 NOTE — ED Notes (Addendum)
Pt reports central chest pain that began when he woke up this afternoon.  Pt reports mid-sternal cp that radiates to his rt arm.  Pt also reports n/v.

## 2011-12-12 NOTE — Procedures (Signed)
Cardiac Catheterization Operative Report  Ryan [name] [id number] 1/8/20135:40 PM No primary provider on file.  Procedure Performed:  1. Left Heart Catheterization 2. Selective Coronary Angiography 3. Left ventricular angiogram  Operator: [name], MD  Arterial access site:  Right radial artery. Failed attempt at access in right femoral artery.  Indication:   Anterior STEMI, cardiac arrest Ryan [name] ED.                                      Procedure Details: The risks, benefits, complications, treatment options, and expected outcomes were discussed with the patient. The patient and/or family concurred with the proposed plan, giving informed verbal emergency consent. The patient was brought to the cath lab via EMS. The patient was further sedated with Versed and Fentanyl. A 6 French sheath was placed in the right femoral artery but we could not pass the wire beyond the right iliac. The right wrist was assessed with an Allens test which was positive. The right wrist was prepped and draped in a sterile fashion. 1% lidocaine was used for local anesthesia. Using the modified Seldinger access technique, a 6 French sheath was placed in the right radial artery. 3 mg Verapamil was given through the sheath. The patient was given a bolus of Angiomax and a drip was started.  Standard diagnostic catheters were used to perform selective coronary angiography. I imaged the left system with a XB LAD 23.5 guiding catheter. The LAD was subtotally occluded in the mid segment. We proceeded to emergent PCI. The XB LAD 3.5 giuiding catheter was in place. A Cougar IC wire was advanced down the vessel after the ACT was >200. A 2.5 x 15 mm balloon was inflated twice in the mid LAD. A 2.75 x 32 mm Promus Element DES was deployed in the mid LAD. A 3.0 x 20 mm  balloon was inflated twice in the stent. There was excellent flow into the distal vessel.    A pigtail catheter was used to perform a left  ventricular angiogram. The sheath was removed from the right radial artery and a Terumo hemostasis band was applied at the arteriotomy site on the right wrist. There was a sheath present in the right femoral artery.    There were no immediate complications. The patient was taken to the recovery area in stable condition.   Hemodynamic Findings: Central aortic pressure: 135/91 Left ventricular pressure: 134/18/29  Angiographic Findings:  Left main:  NO disease.   Left Anterior Descending Artery: Large vessel that courses to the apex. The mid vessel beyond the diagonal has a 99% subtotal occlusion with TIMI 1 flow. The diagonal branch has no obstructive disease.   Circumflex Artery: Moderate sized vessel with two small OM branches. The third OM branch is totally occluded and fills from left to left collaterals. (chronic)  Right Coronary Artery: Large dominant vessel with mild plaque.   Left Ventricular Angiogram: LVEF 35%. Hypokinesis of the anterior wall and apex.    Impression: 1. Acute anterior STEMI secondary to subtotally occluded mid LAD 2. Successful PTCA with placement of a DES in the mid LAD 3. Chronically occluded third OM branch with collateral filling 4. Moderate LV systolic dysfunction 5. S/p cardiac arrest  Recommendations: ASA/Effient/beta blocker/statin/Ace-inh. Echo in am. He will be watched closely in the CCU tonight. If stable, he can be transferred out to telemetry tomorrow afternoon.  Complications:  None. The patient tolerated the procedure well.

## 2011-12-12 NOTE — ED Notes (Signed)
EKG completed and given to Dr Wentz.  

## 2011-12-13 ENCOUNTER — Encounter (HOSPITAL_COMMUNITY): Payer: Self-pay

## 2011-12-13 DIAGNOSIS — I219 Acute myocardial infarction, unspecified: Secondary | ICD-10-CM

## 2011-12-13 LAB — COMPREHENSIVE METABOLIC PANEL
ALT: 83 U/L — ABNORMAL HIGH (ref 0–53)
Alkaline Phosphatase: 76 U/L (ref 39–117)
CO2: 23 mEq/L (ref 19–32)
Chloride: 103 mEq/L (ref 96–112)
GFR calc Af Amer: >90 mL/min (ref >90)
GFR calc non Af Amer: >90 mL/min (ref >90)
Glucose, Bld: 132 mg/dL — ABNORMAL HIGH (ref 70–99)
Potassium: 3.9 mEq/L (ref 3.5–5.1)
Sodium: 138 mEq/L (ref 135–145)
Total Bilirubin: 0.5 mg/dL (ref 0.3–1.2)
Total Protein: 6.6 g/dL (ref 6.0–8.3)

## 2011-12-13 LAB — CBC
HCT: 45 % (ref 39.0–52.0)
MCH: 32.7 pg (ref 26.0–34.0)
MCHC: 33.6 g/dL (ref 30.0–36.0)
RDW: 13.3 % (ref 11.5–15.5)

## 2011-12-13 LAB — CARDIAC PANEL(CRET KIN+CKTOT+MB+TROPI)
CK, MB: 190.3 ng/mL (ref 0.3–4.0)
Total CK: 1773 U/L — ABNORMAL HIGH (ref 7–232)
Troponin I: >25 ng/mL (ref <0.30)

## 2011-12-13 LAB — LIPID PANEL
LDL Cholesterol: 141 mg/dL — ABNORMAL HIGH (ref 0–99)
Triglycerides: 329 mg/dL — ABNORMAL HIGH (ref <150)
VLDL: 66 mg/dL — ABNORMAL HIGH (ref 0–40)

## 2011-12-13 MED ORDER — PNEUMOCOCCAL VAC POLYVALENT 25 MCG/0.5ML IJ INJ
0.5000 mL | INJECTION | INTRAMUSCULAR | Status: AC
  Administered 2011-12-14: 0.5 mL via INTRAMUSCULAR
  Filled 2011-12-13: qty 0.5

## 2011-12-13 MED ORDER — INFLUENZA VIRUS VACC SPLIT PF IM SUSP
0.5000 mL | INTRAMUSCULAR | Status: AC
  Administered 2011-12-13: 0.5 mL via INTRAMUSCULAR
  Filled 2011-12-13: qty 0.5

## 2011-12-13 MED ORDER — SODIUM CHLORIDE 0.9 % IV SOLN
Freq: Once | INTRAVENOUS | Status: AC
  Administered 2011-12-13: 02:00:00 via INTRAVENOUS

## 2011-12-13 MED ORDER — SODIUM CHLORIDE 0.9 % IV SOLN
INTRAVENOUS | Status: AC
  Administered 2011-12-13: 1000 mL via INTRAVENOUS

## 2011-12-13 MED ORDER — NICOTINE 14 MG/24HR TD PT24
14.0000 mg | MEDICATED_PATCH | Freq: Every day | TRANSDERMAL | Status: DC
  Administered 2011-12-13 – 2011-12-16 (×4): 14 mg via TRANSDERMAL
  Filled 2011-12-13 (×5): qty 1

## 2011-12-13 MED ORDER — ALPRAZOLAM 0.25 MG PO TABS
0.2500 mg | ORAL_TABLET | Freq: Four times a day (QID) | ORAL | Status: AC | PRN
  Administered 2011-12-13 – 2011-12-15 (×6): 0.25 mg via ORAL
  Filled 2011-12-13 (×6): qty 1

## 2011-12-13 MED FILL — Fentanyl Citrate Inj 0.05 MG/ML: INTRAMUSCULAR | Qty: 2 | Status: AC

## 2011-12-13 MED FILL — Medication: Qty: 1 | Status: AC

## 2011-12-13 MED FILL — Verapamil HCl IV Soln 2.5 MG/ML: INTRAVENOUS | Qty: 2 | Status: AC

## 2011-12-13 MED FILL — Bivalirudin Trifluoroacetate For IV Soln 250 MG (Base Equiv): INTRAVENOUS | Qty: 250 | Status: AC

## 2011-12-13 MED FILL — Midazolam HCl Inj 2 MG/2ML (Base Equivalent): INTRAMUSCULAR | Qty: 2 | Status: AC

## 2011-12-13 MED FILL — Heparin Sodium (Porcine) 2 Unit/ML in Sodium Chloride 0.9%: INTRAMUSCULAR | Qty: 1000 | Status: AC

## 2011-12-13 MED FILL — Lidocaine HCl Local Preservative Free (PF) Inj 1%: INTRAMUSCULAR | Qty: 30 | Status: AC

## 2011-12-13 MED FILL — Nitroglycerin IV Soln 200 MCG/ML in D5W: INTRAVENOUS | Qty: 1 | Status: AC

## 2011-12-13 MED FILL — Prasugrel HCl Tab 10 MG (Base Equiv): ORAL | Qty: 6 | Status: AC

## 2011-12-13 MED FILL — Dextrose Inj 5%: INTRAVENOUS | Qty: 50 | Status: AC

## 2011-12-13 MED FILL — Metoprolol Tartrate IV Soln 5 MG/5ML: INTRAVENOUS | Qty: 5 | Status: AC

## 2011-12-13 NOTE — Consult Note (Signed)
Pt smokes 1 1/2 ppd and is in action stage eager to quit. Recommended 35 mg patch using 2 patches to start with. Discussed and wrote down patch use instructions for pt including dosage and duration. Referred to 1-800 quit now for f/u and support. Discussed oral fixation substitutes, second hand smoke and in home smoking policy. Reviewed and gave pt Written education/contact information.

## 2011-12-13 NOTE — Progress Notes (Signed)
Sheath pulled at 2155 without problems. Pressure held for 20 minutes and pressure dressing applied. Right femoral site is a level 0. Pedal pulse felt in right foot. Vital signs as high as 117/88 and as low as 72/40. IV nitro gtt turned off. BP 118/66 and metoprolol was given around 2240. BP dropped again in the early am and Dr. Shirlee Latch was notified.  0145  BP 77/60 (64) NS bolus of 250 given. Pt. Is alert and oriented x3.   0415 Update given to Dr. Shirlee Latch. BP 80/66. Order received to d/c metoprolol.

## 2011-12-13 NOTE — Progress Notes (Signed)
SUBJECTIVE: Mild chest soreness on right chest post CPR in [facility] ED. No SOB. Hypotensive overnight.   BP 90/69  Pulse 71  Temp(Src) 98.7 F (37.1 C) (Oral)  Resp 19  Ht 5\' 9"  (1.753 m)  Wt 204 lb 9.4 oz (92.8 kg)  BMI 30.21 kg/m2  SpO2 97%  Intake/Output Summary (Last 24 hours) at 12/13/11 0960 Last data filed at 12/13/11 0145  Gross per 24 hour  Intake   1139 ml  Output    600 ml  Net    539 ml    PHYSICAL EXAM General: Well developed, well nourished, in no acute distress. Alert and oriented x 3.  Psych:  Good affect, responds appropriately Neck: No JVD. No masses noted.  Lungs: Clear bilaterally with no wheezes or rhonci noted.  Heart: RRR with no murmurs noted. Abdomen: Bowel sounds are present. Soft, non-tender.  Extremities: No lower extremity edema. Right groin cath site and right wrist cath site ok. No hematoma.   LABS: Basic Metabolic Panel:  Basename 12/12/11 2220 12/12/11 1706 12/12/11 1550  NA 138 142 --  K 3.9 3.6 --  CL 101 104 --  CO2 25 -- 30  GLUCOSE 119* 195* --  BUN 14 15 --  CREATININE 0.99 1.10 --  CALCIUM 9.1 -- 11.4*  MG 1.7 -- --  PHOS -- -- --   CBC:  Basename 12/12/11 1706 12/12/11 1550  WBC -- 10.6*  NEUTROABS -- 4.8  HGB 17.0 17.5*  HCT 50.0 49.3  MCV -- 95.2  PLT -- 277   Cardiac Enzymes:  Basename 12/13/11 0005 12/12/11 2220 12/12/11 1550  CKTOTAL 1773* 1450* --  CKMB 190.3* 156.9* --  CKMBINDEX -- -- --  TROPONINI >25.00* >25.00* <0.30     Current Meds:    . sodium chloride   Intravenous Once  . aspirin      . aspirin  81 mg Oral Daily  . captopril  6.25 mg Oral TID  . influenza  inactive virus vaccine  0.5 mL Intramuscular Tomorrow-1000  . lisinopril  5 mg Oral Daily  . morphine      . nitroGLYCERIN      . pantoprazole  40 mg Oral Q1200  . pneumococcal 23 valent vaccine  0.5 mL Intramuscular Tomorrow-1000  . potassium chloride  40 mEq Oral Once  . prasugrel  10 mg Oral Daily  . rosuvastatin  40 mg Oral q1800    . sodium chloride  3 mL Intravenous Q12H  . DISCONTD: aspirin EC  81 mg Oral Daily  . DISCONTD: carvedilol  3.125 mg Oral BID WC  . DISCONTD: metoprolol tartrate  25 mg Oral BID  . DISCONTD: pantoprazole  40 mg Oral Q1200  . DISCONTD: rosuvastatin  40 mg Oral Daily     ASSESSMENT AND PLAN:  1. Acute anterior STEMI: Pt presented to [facility] with chest pain, occurring for over a week, worsened yesterday afternoon awakening from sleep. Emergent cath with subtotally occluded mid LAD. Now s/p DES x 1 LAD yesterday. Chronically occluded  OM3 with collaterals. Doing well. Hypotensive overnight. Will hold beta blocker and Ace-inhibitor this am. Continue statin, ASA, Effient. Check Echo today. LV function depressed on LV gram. Troponin greater than 25 so large area of myocardium was affected. Ambulate with cardiac rehab today.   2. Tobacco abuse:  Smoking cessation counseling today.   3. Dispo: Pt will need 24 more hours monitoring in CCU because of hypotension after large anterior wall MI. If BP stable in am,  could be transferred out to telemetry.   Johnda Billiot  1/9/20136:14 AM

## 2011-12-13 NOTE — Progress Notes (Signed)
   CARE MANAGEMENT NOTE 12/13/2011  Patient:  Ryan [name], Ryan [name]   Account Number:  [id number]  Date Initiated:  12/13/2011  Documentation initiated by:  GRAVES-BIGELOW,Kashari Chalmers  Subjective/Objective Assessment:   Pt admitted with cp. S/p cath. From home with girlfriend. Working part time no inusrance.  Plan for home on effient. CM will place effient forms in shadow chart in d/c section. Please fill out and give to pt at d/c.     Action/Plan:   CM discussed with pt that meds can be purchased cheap at [employer]. CM will make pt a f/u PCP appointment at [phone]) .   Anticipated DC Date:  12/15/2011   Anticipated DC Plan:  HOME/SELF CARE      DC Planning Services  CM consult  Medication Assistance      Choice offered to / List presented to:             Status of service:  Completed, signed off Medicare Important Message given?   (If response is "NO", the following Medicare IM given date fields will be blank) Date Medicare IM given:   Date Additional Medicare IM given:    Discharge Disposition:  HOME/SELF CARE  Per UR Regulation:    Comments:  12-13-11 [address] [name], RN,BSN [phone] CM made pt an appointment @ [facility] for Dec 18, 2011 at 10:00 am with NP [facility]. Pt to see Jerene Bears after f/u appointment for [facility] assistance. MD please write rx for effient 30 day free no refills and then original rx with refills. Thanks. No other needs assessed by CM at this time.

## 2011-12-13 NOTE — Progress Notes (Signed)
CARDIAC REHAB PHASE I   PRE:  Rate/Rhythm: 73 SR    BP: sitting 133/77    SaO2:   MODE:  Ambulation: 350 ft   POST:  Rate/Rhythm: 101 ST    BP: sitting 133/86     SaO2:   Tolerated well without c/o. Sts he sat in recliner this am. Now resting in bed. Began ed. Enforced need for restrictions, Effient, smoking cessation, diet change. Will f/u. [phone]  Harriet Masson CES, ACSM

## 2011-12-13 NOTE — Progress Notes (Signed)
  Echocardiogram 2D Echocardiogram has been performed.  [name] 12/13/2011, 5:36 PM

## 2011-12-14 ENCOUNTER — Other Ambulatory Visit: Payer: Self-pay

## 2011-12-14 DIAGNOSIS — I519 Heart disease, unspecified: Secondary | ICD-10-CM

## 2011-12-14 DIAGNOSIS — E1169 Type 2 diabetes mellitus with other specified complication: Secondary | ICD-10-CM

## 2011-12-14 DIAGNOSIS — E782 Mixed hyperlipidemia: Secondary | ICD-10-CM

## 2011-12-14 DIAGNOSIS — E118 Type 2 diabetes mellitus with unspecified complications: Secondary | ICD-10-CM

## 2011-12-14 LAB — BASIC METABOLIC PANEL
BUN: 12 mg/dL (ref 6–23)
GFR calc Af Amer: >90 mL/min (ref >90)
GFR calc non Af Amer: 89 mL/min — ABNORMAL LOW (ref >90)
GFR calc non Af Amer: 83 mL/min — ABNORMAL LOW (ref >90)
Glucose, Bld: 126 mg/dL — ABNORMAL HIGH (ref 70–99)
Potassium: 3.8 mEq/L (ref 3.5–5.1)
Potassium: 3.8 mEq/L (ref 3.5–5.1)
Sodium: 136 mEq/L (ref 135–145)
Sodium: 133 mEq/L — ABNORMAL LOW (ref 135–145)

## 2011-12-14 LAB — CBC
Hemoglobin: 15.1 g/dL (ref 13.0–17.0)
MCHC: 34 g/dL (ref 30.0–36.0)
Platelets: 212 10*3/uL (ref 150–400)
RBC: 4.57 MIL/uL (ref 4.22–5.81)

## 2011-12-14 MED ORDER — DIGOXIN 250 MCG PO TABS
0.2500 mg | ORAL_TABLET | Freq: Every day | ORAL | Status: DC
  Administered 2011-12-15: 0.25 mg via ORAL
  Filled 2011-12-14 (×2): qty 1

## 2011-12-14 MED ORDER — SPIRONOLACTONE 12.5 MG HALF TABLET
12.5000 mg | ORAL_TABLET | Freq: Every day | ORAL | Status: DC
  Administered 2011-12-15 – 2011-12-16 (×2): 12.5 mg via ORAL
  Filled 2011-12-14 (×2): qty 1

## 2011-12-14 MED ORDER — ENALAPRIL MALEATE 2.5 MG PO TABS
2.5000 mg | ORAL_TABLET | Freq: Two times a day (BID) | ORAL | Status: DC
  Administered 2011-12-14: 2.5 mg via ORAL
  Filled 2011-12-14 (×3): qty 1

## 2011-12-14 MED ORDER — CARVEDILOL 3.125 MG PO TABS
3.1250 mg | ORAL_TABLET | Freq: Two times a day (BID) | ORAL | Status: DC
  Filled 2011-12-14 (×3): qty 1

## 2011-12-14 NOTE — Progress Notes (Signed)
Pt walking independently all morning. Denies CP or SOB. Sts he feels great. Discussed ed with pt including watching carbs/sugar. Has a plan to quit smoking. Requests his name be sent to [facility]. Will send referral. [phone] Ethelda Chick CES, ACSM

## 2011-12-14 NOTE — Progress Notes (Signed)
Patient Name: Ryan [name] Date of Encounter: 12/14/2011  Principal Problem:  *ST elevation myocardial infarction (STEMI) of anterior wall    SUBJECTIVE:No CP/SOB. Has walked multiple laps around the CCU without any symptoms. BP is lower in left arm than right. PTA had frequent leg cramps but doesn't know if K+ was low then or not.   OBJECTIVE  Filed Vitals:   12/14/11 0800 12/14/11 1204 12/14/11 1600 12/14/11 1637  BP: 123/79 141/73 100/69   Pulse: 98 86  107  Temp: 97.3 F (36.3 C) 97.3 F (36.3 C)  98.5 F (36.9 C)  TempSrc: Oral Oral  Oral  Resp: 16  16   Height:      Weight:      SpO2: 97% 96%  96%    Intake/Output Summary (Last 24 hours) at 12/14/11 1727 Last data filed at 12/14/11 1300  Gross per 24 hour  Intake   1320 ml  Output      1 ml  Net   1319 ml   Weight change: 1 lb 15.8 oz (0.9 kg)  PHYSICAL EXAM  General: Well developed, well nourished, in no acute distress. Head: Normocephalic, atraumatic.  Neck: Supple without bruits, JVD normal. JVP 9-10 Lungs:  Resp regular and unlabored, CTA except for a few rales. Heart: RRR no s3, s4, or murmurs. Tachy regular Abdomen: Soft, non-tender, non-distended, BS present  Extremities: No clubbing, cyanosis, no edema.  Neuro: Alert and oriented X 3. Moves all extremities spontaneously. Psych: Normal affect.  LABS:  CBC: Basename 12/14/11 0537 12/13/11 0607 12/12/11 1550  WBC 11.8* 14.1* --  NEUTROABS -- -- 4.8  HGB 15.1 15.1 --  HCT 44.4 45.0 --  MCV 97.2 97.4 --  PLT 212 226 --   Basic Metabolic Panel: Basename 12/14/11 0537 12/13/11 0607 12/12/11 2220  NA 136 138 --  K 3.8 3.9 --  CL 100 103 --  CO2 27 23 --  GLUCOSE 135* 132* --  BUN 12 13 --  CREATININE 1.00 0.96 --  CALCIUM 9.0 9.1 --  MG -- -- 1.7  PHOS -- -- --   Liver Function Tests: [facility] 12/13/11 0607  AST 174*  ALT 83*  ALKPHOS 76  BILITOT 0.5  PROT 6.6  ALBUMIN 3.4*   Cardiac Enzymes: Basename 12/13/11 0607 12/13/11  0005 12/12/11 2220  CKTOTAL 1937* 1773* 1450*  CKMB 194.7* 190.3* 156.9*  CKMBINDEX -- -- --  TROPONINI >25.00* >25.00* >25.00*   Hemoglobin A1C: Basename 12/12/11 2220  HGBA1C 6.5*   Fasting Lipid Panel: Basename 12/13/11 0607  CHOL 237*  HDL 30*  LDLCALC 141*  TRIG 329*  CHOLHDL 7.9  LDLDIRECT --   TELE: SR/ST, </= 5 bt runs NSVT, none in over 12 hours.  Echo:Study Conclusions - Left ventricle: The cavity size was normal. Wall thickness was increased in a pattern of mild LVH. Systolic function was moderately reduced. The estimated ejection fraction was 35%. The mid to apical anteroseptal and anterior walls, the apical inferior wall, and the true apex were severely hypokinetic. Features are consistent with a pseudonormal left ventricular filling pattern, with concomitant abnormal relaxation and increased filling pressure (grade 2 diastolic dysfunction). - Aortic valve: There was no stenosis. - Mitral valve: No significant regurgitation. - Right ventricle: The cavity size was normal. Systolic function was normal. - Pulmonary arteries: No complete TR doppler jet so unable to estimate PA systolic pressure. - Inferior vena cava: The vessel was normal in size; the respirophasic diameter changes were in the  normal range (= 50%); findings are consistent with normal central venous pressure. Impressions: - Normal LV size with moderate systolic dysfunction, EF 35%. See above for wall motion abnormalities, pattern suggestive of LAD disease. Moderate diastolic dysfunction. Normal RV size and systolic function. No significant valvular dysfunction.   Radiology/Studies:  No results found.  Cath results:Left main: NO disease.  Left Anterior Descending Artery: Large vessel that courses to the apex. The mid vessel beyond the diagonal has a 99% subtotal occlusion with TIMI 1 flow. The diagonal branch has no obstructive disease.  Circumflex Artery: Moderate sized vessel with two  small OM branches. The third OM branch is totally occluded and fills from left to left collaterals. (chronic)  Right Coronary Artery: Large dominant vessel with mild plaque.  Left Ventricular Angiogram: LVEF 35%. Hypokinesis of the anterior wall and apex.     Current Medications:     . aspirin  81 mg Oral Daily  . nicotine  14 mg Transdermal Daily  . pneumococcal 23 valent vaccine  0.5 mL Intramuscular Tomorrow-1000  . prasugrel  10 mg Oral Daily  . rosuvastatin  40 mg Oral q1800  . sodium chloride  3 mL Intravenous Q12H    ASSESSMENT AND PLAN: Patient Active Problem List  Diagnoses  . Tobacco abuse - continue cessation and patch   . VT/Torsades arrest in ER - s/p 2 defib and CPR, K+ 3.9 today, will keep >4.0, no BB because of borderline BP  . ST elevation myocardial infarction (STEMI) of anterior wall - s/p 2.75 x 32 mm Promus Element DES in the mid LAD  - no ischemic symptoms, OK to transfer to telemetry.    LVD EF 35% at echo - No ACE/BB at this time because of low BP but would add BB first if BP will tolerate.  . Hyperlipidemia - continue statin   Plan: d/c when medically stable - discuss in am.   Signed, Theodore Demark , PA-C  ATTENDING. Pt seen and examined. Discussed with Theodore Demark, He is doing well post MI. Has significant LV dysfunction and remains tachycardic. He is at high risk for the development of significant HF and will need aggressive medical therapy. Will start digoxin, spironolactone, low dose ace-i and b-block. Ok to transfer to tele. Will need cardiac rehab. Given apical AK may need to cosnider short course of coumadin though would be at high risk for bleeding with triple therapy. Need for smoking cessation discussed.   Truman Hayward 7:44 PM

## 2011-12-15 ENCOUNTER — Inpatient Hospital Stay (HOSPITAL_COMMUNITY): Payer: Medicaid Other

## 2011-12-15 DIAGNOSIS — I2109 ST elevation (STEMI) myocardial infarction involving other coronary artery of anterior wall: Secondary | ICD-10-CM

## 2011-12-15 MED ORDER — CARVEDILOL 6.25 MG PO TABS
6.2500 mg | ORAL_TABLET | Freq: Two times a day (BID) | ORAL | Status: DC
  Administered 2011-12-15 – 2011-12-16 (×3): 6.25 mg via ORAL
  Filled 2011-12-15 (×5): qty 1

## 2011-12-15 MED ORDER — HYDROCODONE-ACETAMINOPHEN 5-325 MG PO TABS
1.0000 | ORAL_TABLET | ORAL | Status: DC | PRN
  Administered 2011-12-15 (×2): 2 via ORAL
  Filled 2011-12-15 (×2): qty 2

## 2011-12-15 MED ORDER — ENALAPRIL MALEATE 5 MG PO TABS
5.0000 mg | ORAL_TABLET | Freq: Two times a day (BID) | ORAL | Status: DC
  Administered 2011-12-15 – 2011-12-16 (×3): 5 mg via ORAL
  Filled 2011-12-15 (×4): qty 1

## 2011-12-15 MED ORDER — ALPRAZOLAM 0.25 MG PO TABS
0.2500 mg | ORAL_TABLET | Freq: Four times a day (QID) | ORAL | Status: DC | PRN
  Administered 2011-12-15 – 2011-12-16 (×2): 0.25 mg via ORAL
  Filled 2011-12-15 (×2): qty 1

## 2011-12-15 NOTE — Progress Notes (Signed)
Patient transferred to unit at approx 2100 hrs 12/14/11.  S/p STEMI with VT arrest and cardiac cath with stent placement.  He arrived to the unit alert and oriented x4, ambulatory, and in no apparent distress.  He denies shortness of breath but endorses chest pain, states "from where they did CPR."  Patient placed on telemetry and oriented to room and floor procedures.  Vital signs stable.  Reinforced falls precautions and the need for monitoring intake and output.  Pt and spouse voiced understanding.  Continue with current plan of care.  Staff will continue to monitor.  At present patient is resting comfortably. Bunnie Lederman T. Claudette Laws, RN

## 2011-12-15 NOTE — Progress Notes (Signed)
Subjective:  NO chest pain or shortness of breath. No complaints today.  Objective:  Vital Signs in the last 24 hours: Temp:  [97.3 F (36.3 C)-99.7 F (37.6 C)] 98.8 F (37.1 C) (01/11 0624) Pulse Rate:  [86-107] 104  (01/11 0443) Resp:  [16-18] 18  (01/11 0443) BP: (100-141)/(61-77) 122/61 mmHg (01/11 0443) SpO2:  [95 %-99 %] 99 % (01/11 0443) Weight:  [89.7 kg (197 lb 12 oz)-90.8 kg (200 lb 2.8 oz)] 89.7 kg (197 lb 12 oz) (01/11 0443)  Intake/Output from previous day: 01/10 0701 - 01/11 0700 In: 1720 [P.O.:1720] Out: 955 [Urine:955]  Physical Exam: Pt is alert and oriented, NAD HEENT: normal Neck: JVP - normal, carotids 2+= without bruits Lungs: CTA bilaterally CV: RRR without murmur or gallop Abd: soft, NT, Positive BS, no hepatomegaly Ext: no C/C/E, distal pulses intact and equal Skin: warm/dry no rash   Lab Results:  Basename 12/14/11 0537 12/13/11 0607  WBC 11.8* 14.1*  HGB 15.1 15.1  PLT 212 226    Basename 12/14/11 2117 12/14/11 0537  NA 133* 136  K 3.8 3.8  CL 99 100  CO2 25 27  GLUCOSE 126* 135*  BUN 13 12  CREATININE 1.06 1.00    Basename 12/13/11 0607 12/13/11 0005  TROPONINI >25.00* >25.00*    Cardiac Studies: 2D echo: Study Conclusions  - Left ventricle: The cavity size was normal. Wall thickness was increased in a pattern of mild LVH. Systolic function was moderately reduced. The estimated ejection fraction was 35%. The mid to apical anteroseptal and anterior walls, the apical inferior wall, and the true apex were severely hypokinetic. Features are consistent with a pseudonormal left ventricular filling pattern, with concomitant abnormal relaxation and increased filling pressure (grade 2 diastolic dysfunction).  Tele: Sinus rhythm, sinus tachycardia.  Assessment/Plan:  1. Anterolateral MI 2. Severe LV dysfunction with LVEF 35% post-infarct 3. Tobacco abuse 4. Dyslipidemia - LDL 141, trig 329  Pt will benefit from another day  of med titration. Heart rate is still hovering at 100 bpm. Recommend increase coreg to 6.25 mg bid and increase enalapril to 5 mg bid. Continue aldactone.  Tonny Bollman, M.D. 12/15/2011, 8:51 AM

## 2011-12-15 NOTE — Progress Notes (Signed)
   CARE MANAGEMENT NOTE 12/15/2011  Patient:  Ryan [name], Ryan [name]   Account Number:  [id number]  Date Initiated:  12/13/2011  Documentation initiated by:  GRAVES-BIGELOW,Sweet Jarvis  Subjective/Objective Assessment:   Pt admitted with cp. S/p cath. From home with girlfriend. Working part time no inusrance.  Plan for home on effient. CM will place effient forms in shadow chart in d/c section. Please fill out and give to pt at d/c.     Action/Plan:   CM discussed with pt that meds can be purchased cheap at [employer]. CM will make pt a f/u PCP appointment at [phone]) .   Anticipated DC Date:  12/15/2011   Anticipated DC Plan:  HOME/SELF CARE      DC Planning Services  CM consult  Medication Assistance      Choice offered to / List presented to:             Status of service:  Completed, signed off Medicare Important Message given?   (If response is "NO", the following Medicare IM given date fields will be blank) Date Medicare IM given:   Date Additional Medicare IM given:    Discharge Disposition:  HOME/SELF CARE  Per UR Regulation:    Comments:  12-15-11 1639 [name], RN,BSN [phone] F/U appointment rescheduled from Dec 18, 2011 at 10:00 am to Dec 27, 2011 at 0830 with NP [facility].  12-13-11 1551 [name], [place] [phone] CM made pt an appointment @ [facility] for Dec 18, 2011 at 10:00 am with NP [facility]. Pt to see Jerene Bears after f/u appointment for [facility] assistance. MD please write rx for effient 30 day free no refills and then original rx with refilss. Thanks. No other needs assessed by CM at this time.

## 2011-12-15 NOTE — Plan of Care (Signed)
Problem: Phase III Progression Outcomes Goal: Vascular site scale level 0 - I Vascular Site Scale Level 0: No bruising/bleeding/hematoma Level I (Mild): Bruising/Ecchymosis, minimal bleeding/ooozing, palpable hematoma < 3 cm Level II (Moderate): Bleeding not affecting hemodynamic parameters, pseudoaneurysm, palpable hematoma > 3 cm  Outcome: Completed/Met Date Met:  12/15/11 Right femoral and right radial sites are level 0.

## 2011-12-16 DIAGNOSIS — I2109 ST elevation (STEMI) myocardial infarction involving other coronary artery of anterior wall: Secondary | ICD-10-CM

## 2011-12-16 LAB — BASIC METABOLIC PANEL
BUN: 19 mg/dL (ref 6–23)
Chloride: 99 mEq/L (ref 96–112)
GFR calc Af Amer: >90 mL/min (ref >90)
Potassium: 3.9 mEq/L (ref 3.5–5.1)
Sodium: 134 mEq/L — ABNORMAL LOW (ref 135–145)

## 2011-12-16 MED ORDER — ALPRAZOLAM 0.25 MG PO TABS: 0.2500 mg | ORAL_TABLET | Freq: Four times a day (QID) | ORAL | Status: AC | PRN

## 2011-12-16 MED ORDER — HYDROCODONE-ACETAMINOPHEN 5-325 MG PO TABS: 1.0000 | ORAL_TABLET | ORAL | Status: AC | PRN

## 2011-12-16 MED ORDER — NITROGLYCERIN 0.4 MG SL SUBL: 0.4000 mg | SUBLINGUAL_TABLET | SUBLINGUAL | Status: DC | PRN

## 2011-12-16 MED ORDER — PRASUGREL HCL 10 MG PO TABS: 10.0000 mg | ORAL_TABLET | Freq: Every day | ORAL | Status: DC

## 2011-12-16 MED ORDER — ROSUVASTATIN CALCIUM 40 MG PO TABS
40.0000 mg | ORAL_TABLET | Freq: Every day | ORAL | Status: DC
  Administered 2011-12-16: 40 mg via ORAL
  Filled 2011-12-16: qty 1

## 2011-12-16 MED ORDER — SPIRONOLACTONE 12.5 MG HALF TABLET: 12.5000 mg | ORAL_TABLET | Freq: Every day | ORAL | Status: DC

## 2011-12-16 MED ORDER — ASPIRIN EC 81 MG PO TBEC
81.0000 mg | DELAYED_RELEASE_TABLET | Freq: Every day | ORAL | Status: DC
  Administered 2011-12-16: 81 mg via ORAL
  Filled 2011-12-16: qty 1

## 2011-12-16 MED ORDER — NICOTINE 14 MG/24HR TD PT24: 30.0000 | MEDICATED_PATCH | Freq: Every day | TRANSDERMAL | Status: AC

## 2011-12-16 MED ORDER — ENALAPRIL MALEATE 5 MG PO TABS: 5.0000 mg | ORAL_TABLET | Freq: Two times a day (BID) | ORAL | Status: DC

## 2011-12-16 MED ORDER — ROSUVASTATIN CALCIUM 40 MG PO TABS: 40.0000 mg | ORAL_TABLET | Freq: Every day | ORAL | Status: DC

## 2011-12-16 MED ORDER — PRASUGREL HCL 10 MG PO TABS
10.0000 mg | ORAL_TABLET | Freq: Every day | ORAL | Status: DC
  Administered 2011-12-16: 10 mg via ORAL
  Filled 2011-12-16: qty 1

## 2011-12-16 MED ORDER — NICOTINE 14 MG/24HR TD PT24: 30.0000 | MEDICATED_PATCH | Freq: Every day | TRANSDERMAL | Status: DC

## 2011-12-16 MED ORDER — CARVEDILOL 6.25 MG PO TABS: 6.2500 mg | ORAL_TABLET | Freq: Two times a day (BID) | ORAL | Status: DC

## 2011-12-16 NOTE — Progress Notes (Signed)
   CARE MANAGEMENT NOTE 12/16/2011  Patient:  Ryan [name], Ryan [name]   Account Number:  [id number]  Date Initiated:  12/13/2011  Documentation initiated by:  GRAVES-BIGELOW,BRENDA  Subjective/Objective Assessment:   Pt admitted with cp. S/p cath. From home with girlfriend. Working part time no inusrance.  Plan for home on effient. CM will place effient forms in shadow chart in d/c section. Please fill out and give to pt at d/c.     Action/Plan:   CM discussed with pt that meds can be purchased cheap at [employer]. CM will make pt a f/u PCP appointment at [phone]) .   Anticipated DC Date:  12/15/2011   Anticipated DC Plan:  HOME/SELF CARE      DC Planning Services  CM consult  Medication Assistance      Choice offered to / List presented to:             Status of service:  Completed, signed off Medicare Important Message given?   (If response is "NO", the following Medicare IM given date fields will be blank) Date Medicare IM given:   Date Additional Medicare IM given:    Discharge Disposition:  HOME/SELF CARE  Per UR Regulation:    Comments:  12/16/2011 1300 Provided pt with Effient copay card and 30 day free sample card. Friend states she has been online and found other programs that may assist with his meds. [name] RN CCM Case Mgmt phone [phone]  12-15-11 1639 [name], [place] [phone] F/U appointment rescheduled from Dec 18, 2011 at 10:00 am to Dec 27, 2011 at 0830 with NP [facility].  12-13-11 1551 [name], [place] [phone] CM made pt an appointment @ [facility] for Dec 18, 2011 at 10:00 am with NP [facility]. Pt to see Jerene Bears after f/u appointment for [facility] assistance. MD please write rx for effient 30 day free no refills and then original rx with refilss. Thanks. No other needs assessed by CM at this time.

## 2011-12-16 NOTE — Progress Notes (Signed)
Client verbalized understanding of discharge instructions.  Case manager was made aware of expiration date of 12/04/2011 on Effient med.  Client okayed to be released.   Thanks

## 2011-12-16 NOTE — Progress Notes (Addendum)
Subjective: No chest pain  Breathing is OK Objective: Filed Vitals:   12/15/11 1500 12/15/11 1814 12/15/11 2109 12/16/11 0500  BP: 122/60 119/63 110/69 109/73  Pulse: 88 83 78 82  Temp: 98 F (36.7 C)  98 F (36.7 C) 98.4 F (36.9 C)  TempSrc:   Oral Oral  Resp: 20  18 19   Height:      Weight:    197 lb 8.5 oz (89.6 kg)  SpO2: 99%  97% 98%   Weight change: -2 lb 10.3 oz (-1.2 kg)  Intake/Output Summary (Last 24 hours) at 12/16/11 0840 Last data filed at 12/16/11 0830  Gross per 24 hour  Intake   1213 ml  Output    700 ml  Net    513 ml    General: Alert, awake, oriented x3, in no acute distress Neck:  JVP is normal Heart: Regular rate and rhythm, without murmurs, rubs, gallops.  Lungs: Clear to auscultation.  No rales or wheezes. Exemities:  No edema.   Neuro: Grossly intact, nonfocal.   Lab Results: Results for orders placed during the hospital encounter of 12/12/11 (from the past 24 hour(s))  BASIC METABOLIC PANEL     Status: Abnormal   Collection Time   12/16/11  6:15 AM      Component Value Range   Sodium 134 (*) 135 - 145 (mEq/L)   Potassium 3.9  3.5 - 5.1 (mEq/L)   Chloride 99  96 - 112 (mEq/L)   CO2 23  19 - 32 (mEq/L)   Glucose, Bld 115 (*) 70 - 99 (mg/dL)   BUN 19  6 - 23 (mg/dL)   Creatinine, Ser 1.61  0.50 - 1.35 (mg/dL)   Calcium 9.4  8.4 - 09.6 (mg/dL)   GFR calc non Af Amer >90  >90 (mL/min)   GFR calc Af Amer >90  >90 (mL/min)    Studies/Results: Dg Chest 2 View  12/15/2011  *RADIOLOGY REPORT*  Clinical Data: STEMI. Post code with chest pain  CHEST - 2 VIEW  Comparison: None.  Findings: Heart and mediastinal contours are within normal limits. The lung fields appear clear with no signs of focal infiltrate or congestive failure.  No pleural fluid or significant peribronchial cuffing is seen.  Bony structures appear intact with the lateral view of the sternum demonstrate no focal bony abnormality.  IMPRESSION: Normal chest with no worrisome focal or  acute abnormality seen.  Original Report Authenticated By: Bertha Stakes, M.D.    Medications: I have reviewed the patient's current medications.   Patient Active Hospital Problem List: ST elevation myocardial infarction (STEMI) of anterior wall (12/12/2011)   Assessment: s/p PTCA/DES to mid LAD.  LV dysfunction with LVEF of 35%.  COntinue meds.  WIll see about life vest.  Contine coreg,   WIll D/C digoxin  Continue vasotec and aldactone.   Plan:  Hyperlipidemia (12/14/2011)   Assessment: crestor for now   Plan:  LV dysfunction (12/14/2011)   Assessment: Volume status looks good.   Continue meds.   Plan:  Tobacco:  Continue nicotine patch as he has on. Can take short course of Xanax at home   Will need LE artierial dopplers as outpatient.  LOS: 4 days   Ryan [name] 12/16/2011, 8:40 AM

## 2011-12-16 NOTE — Discharge Summary (Signed)
Physician Discharge Summary  Patient ID: Ryan [name] MRN: [id number] DOB/AGE: 05-Dec-1965 46 y.o.  Admit date: 12/30/2011 Discharge date: 12/16/2011  Primary Discharge Diagnosis: 1.STEMI-Anterior s/p Cardiac arrest.  Secondary Discharge Diagnosis: 1. CAD-s/p PTCA using DES to the mid LAD 12-30-2011 2. Hyperlipidemia 3. Ischemic CM with EF 35% 4. Tobacco Abuse   Significant Diagnostic Studies: Cardiac Catheterization 2010-12-29 Impression:  1. Acute anterior STEMI secondary to subtotally occluded mid LAD  2. Successful PTCA with placement of a DES in the mid LAD  3. Chronically occluded third OM branch with collateral filling  4. Moderate LV systolic dysfunction  5. S/p cardiac arrest  ECHO 12/13/2011 Left ventricle: The cavity size was normal. Wall thickness was increased in a pattern of mild LVH. Systolic function was moderately reduced. The estimated ejection fraction was 35%. The mid to apical anteroseptal and anterior walls, the apical inferior wall, and the true apex were severely hypokinetic. Features are consistent with a pseudonormal left ventricular filling pattern, with concomitant abnormal relaxation and increased filling pressure (grade 2 diastolic dysfunction). - Aortic valve: There was no stenosis. - Mitral valve: No significant regurgitation. - Right ventricle: The cavity size was normal. Systolic function was normal. - Pulmonary arteries: No complete TR doppler jet so unable to estimate PA systolic pressure. - Inferior vena cava: The vessel was normal in size; the respirophasic diameter changes were in the normal range (= 50%); findings are consistent with normal central venous pressure.   Hospital Course: Ryan [name] is a 46 year old patient who has no prior cardiac history who presented[facility] ER with acute anterior ST elevation MI, transferred emergently to [facility] tone where he had emergent cardiac catheterization per Dr. [name]. The patient had  been experiencing chest pain exertionally most days a week lasting 10-15 minutes at a time resolving with rest on day of admission he awoke from sleep around 3 PM with severe subscapular chest pain and left arm pain with associated shortness of breath and dizziness (the patient works nights and sleeps during the day) while in hospital the patient had VT and torsades and was treated with 20 mg of IV amiodarone defibrillated and brought back to normal sinus rhythm. He had a cardiac catheterization revealed revealing a 99% subtotal occlusion of the large LAD with no diagonal branch obstructive disease. The circumflex artery was found to be moderate with 2 small OM branches the third OM branch was totally occluded and fills from left to left collaterals. The right coronary artery was large dominant vessel with mild plaquing he was found to have systolic dysfunction with an LV EF of 35%. The LAD was treated with a drug-eluting stent he was placed on ACE inhibitor,Effient, beta blocker, statin and had a followup echocardiogram.  Echocardiogram as discussed above confirmed systolic dysfunction with mid to apical anterior septal and anterior wall apical inferior wall and the true apex were found to be severely hypokinetic he also had evidence of grade 2 diastolic dysfunction. Liver studies were completed he was found to have an LDL 141 with triglycerides of 329 and started on Crestor 40 mg by mouth daily. Coreg was also started at 6.25 mg twice a day Vasotec at 5 mg by mouth twice a day he was also started on small lactone 12.5 mg daily. Digoxin was started temporarily but this was discontinued. He was also given drug assistance forms and prescriptions for EFFIENT 30 days for free secondary to lack of insurance. Paperwork was filled out by me along with extra prescriptions to  allow for free dosing of FEN. There was also discussion to have a Life Vest placed with known history of V. tach prior to stenting. Secondary to  lack of insurance it was not able to be addressed over the weekend but should be addressed as an outpatient if high suspicion for recurrence of V. tach is warranted. He will have a quick followup with Dr. Diona Browner in the office in refill where he will be established in the practice for titration of Coreg, and monitoring of continued cardiac risk factors.  Discharge Exam: Blood pressure 109/73, pulse 82, temperature 98.4 F (36.9 C), temperature source Oral, resp. rate 19, height 5\' 9"  (1.753 m), weight 197 lb 8.5 oz (89.6 kg), SpO2 98.00%.    Labs:   Lab Results  Component Value Date   WBC 11.8* 12/14/2011   HGB 15.1 12/14/2011   HCT 44.4 12/14/2011   MCV 97.2 12/14/2011   PLT 212 12/14/2011    Lab 12/16/11 0615 12/13/11 0607  NA 134* --  K 3.9 --  CL 99 --  CO2 23 --  BUN 19 --  CREATININE 0.97 --  CALCIUM 9.4 --  PROT -- 6.6  BILITOT -- 0.5  ALKPHOS -- 76  ALT -- 83*  AST -- 174*  GLUCOSE 115* --   Lab Results  Component Value Date   CKTOTAL 1937* 12/13/2011   CKMB 194.7* 12/13/2011   TROPONINI >25.00* 12/13/2011    Lab Results  Component Value Date   CHOL 237* 12/13/2011   Lab Results  Component Value Date   HDL 30* 12/13/2011   Lab Results  Component Value Date   LDLCALC 141* 12/13/2011   Lab Results  Component Value Date   TRIG 329* 12/13/2011   Lab Results  Component Value Date   CHOLHDL 7.9 12/13/2011   No results found for this basename: LDLDIRECT      Radiology: Dg Chest 2 View  12/15/2011  *RADIOLOGY REPORT*  Clinical Data: STEMI. Post code with chest pain  CHEST - 2 VIEW  Comparison: None.  Findings: Heart and mediastinal contours are within normal limits. The lung fields appear clear with no signs of focal infiltrate or congestive failure.  No pleural fluid or significant peribronchial cuffing is seen.  Bony structures appear intact with the lateral view of the sternum demonstrate no focal bony abnormality.  IMPRESSION: Normal chest with no worrisome focal or  acute abnormality seen.  Original Report Authenticated By: Bertha Stakes, M.D.    EKG:NSR Anteroseptal inforct with marked lateral T-wave inversion. Rate of 77 bpm  FOLLOW UP PLANS AND APPOINTMENTS  Current Discharge Medication List    START taking these medications   Details  ALPRAZolam (XANAX) 0.25 MG tablet Take 1 tablet (0.25 mg total) by mouth 4 (four) times daily as needed for anxiety. Qty: 30 tablet, Refills: 1    carvedilol (COREG) 6.25 MG tablet Take 1 tablet (6.25 mg total) by mouth 2 (two) times daily with a meal. Qty: 60 tablet, Refills: 6    enalapril (VASOTEC) 5 MG tablet Take 1 tablet (5 mg total) by mouth 2 (two) times daily. Qty: 60 tablet, Refills: 6    HYDROcodone-acetaminophen (NORCO) 5-325 MG per tablet Take 1-2 tablets by mouth every 4 (four) hours as needed. Qty: 30 tablet, Refills: 1    nicotine (NICODERM CQ - DOSED IN MG/24 HOURS) 14 mg/24hr patch Place 30 patches onto the skin daily. Qty: 28 patch, Refills: 0    nitroGLYCERIN (NITROSTAT) 0.4 MG SL tablet  Place 1 tablet (0.4 mg total) under the tongue every 5 (five) minutes as needed for chest pain. Qty: 30 tablet, Refills: 6    prasugrel (EFFIENT) 10 MG TABS Take 1 tablet (10 mg total) by mouth daily. Qty: 30 tablet, Refills: 0    rosuvastatin (CRESTOR) 40 MG tablet Take 1 tablet (40 mg total) by mouth daily at 6 PM. Qty: 30 tablet, Refills: 6    spironolactone (ALDACTONE) 12.5 mg TABS Take 0.5 tablets (12.5 mg total) by mouth daily. Qty: 30 tablet, Refills: 6      CONTINUE these medications which have NOT CHANGED   Details  acetaminophen (TYLENOL) 500 MG tablet Take 1,000 mg by mouth every 6 (six) hours as needed. For pain     aspirin 81 MG tablet Take 160 mg by mouth daily.      ibuprofen (ADVIL,MOTRIN) 200 MG tablet Take 400 mg by mouth every 6 (six) hours as needed. For pain        Follow-up Information    Follow up on 12/16/2011.      Follow up with Nona Dell, MD. (Our  office will call for appointment)    Contact information:   [address] [place] [address] [phone]            Time spent with patient to include physician time:50 minutes Signed: Joni Reining 12/16/2011, 10:50 AM Co-Sign MD

## 2011-12-25 ENCOUNTER — Encounter: Payer: Self-pay | Admitting: Cardiology

## 2011-12-26 ENCOUNTER — Ambulatory Visit (INDEPENDENT_AMBULATORY_CARE_PROVIDER_SITE_OTHER): Payer: Self-pay | Admitting: Cardiology

## 2011-12-26 ENCOUNTER — Encounter: Payer: Self-pay | Admitting: Cardiology

## 2011-12-26 ENCOUNTER — Other Ambulatory Visit: Payer: Self-pay | Admitting: Cardiology

## 2011-12-26 VITALS — BP 126/77 | HR 76 | Ht 68.0 in | Wt 206.0 lb

## 2011-12-26 DIAGNOSIS — F17200 Nicotine dependence, unspecified, uncomplicated: Secondary | ICD-10-CM

## 2011-12-26 DIAGNOSIS — E782 Mixed hyperlipidemia: Secondary | ICD-10-CM

## 2011-12-26 DIAGNOSIS — I2109 ST elevation (STEMI) myocardial infarction involving other coronary artery of anterior wall: Secondary | ICD-10-CM

## 2011-12-26 DIAGNOSIS — I739 Peripheral vascular disease, unspecified: Secondary | ICD-10-CM | POA: Insufficient documentation

## 2011-12-26 DIAGNOSIS — I429 Cardiomyopathy, unspecified: Secondary | ICD-10-CM

## 2011-12-26 DIAGNOSIS — Z720 Tobacco use: Secondary | ICD-10-CM

## 2011-12-26 DIAGNOSIS — I2510 Atherosclerotic heart disease of native coronary artery without angina pectoris: Secondary | ICD-10-CM

## 2011-12-26 HISTORY — DX: Cardiomyopathy, unspecified: I42.9

## 2011-12-26 HISTORY — DX: Peripheral vascular disease, unspecified: I73.9

## 2011-12-26 NOTE — Assessment & Plan Note (Signed)
Importance of smoking cessation discussed. 

## 2011-12-26 NOTE — Assessment & Plan Note (Signed)
Continue medical therapy and observation. 

## 2011-12-26 NOTE — Assessment & Plan Note (Signed)
Patient symptomatically stable at this time. To continue present medical therapy. Importance of DAPT discussed. Encouraged walking regimen. Followup arranged.

## 2011-12-26 NOTE — Progress Notes (Signed)
Clinical Summary Ryan [name] is a 46 y.o.male presenting for post hosptial followup. This is my first meeting with him. History is reviewed below. He was managed by Dr. [name] with acute AMI, LVEF was 35% in acute setting, also treated for ventricular arrhythmia. He is now wearing a LifeVest.  He reports no chest pain, no palpitations, no shocks from device. [facility]   We reviewed his medications and their indications, discussed the importance of uninterrupted DAPT. He reports no bleeding problems.  He describes claudication symptoms bilaterally. States that he is working hard not to resume smoking.  No Known Allergies  Current Outpatient Prescriptions  Medication Sig Dispense Refill  . ALPRAZolam (XANAX) 0.25 MG tablet Take 1 tablet (0.25 mg total) by mouth 4 (four) times daily as needed for anxiety.  30 tablet  1  . aspirin 81 MG tablet Take 160 mg by mouth daily.        . carvedilol (COREG) 6.25 MG tablet Take 1 tablet (6.25 mg total) by mouth 2 (two) times daily with a meal.  60 tablet  6  . enalapril (VASOTEC) 5 MG tablet Take 1 tablet (5 mg total) by mouth 2 (two) times daily.  60 tablet  6  . HYDROcodone-acetaminophen (NORCO) 5-325 MG per tablet Take 1-2 tablets by mouth every 4 (four) hours as needed.  30 tablet  1  . ibuprofen (ADVIL,MOTRIN) 200 MG tablet Take 400 mg by mouth every 6 (six) hours as needed. For pain       . nicotine (NICODERM CQ - DOSED IN MG/24 HOURS) 14 mg/24hr patch Place 30 patches onto the skin daily.  28 patch  0  . nitroGLYCERIN (NITROSTAT) 0.4 MG SL tablet Place 1 tablet (0.4 mg total) under the tongue every 5 (five) minutes as needed for chest pain.  30 tablet  6  . prasugrel (EFFIENT) 10 MG TABS Take 1 tablet (10 mg total) by mouth daily.  30 tablet  0  . rosuvastatin (CRESTOR) 40 MG tablet Take 1 tablet (40 mg total) by mouth daily at 6 PM.  30 tablet  6  .  spironolactone (ALDACTONE) 12.5 mg TABS Take 0.5 tablets (12.5 mg total) by mouth daily.  30 tablet  6    Past Medical History  Diagnosis Date  . Coronary atherosclerosis of native coronary artery     DES LAD 1/13, LVEF 35%  . MI (myocardial infarction)     AMI 1/13 - complicated by VT/Tosades  . Chronic back pain   . Anal fissure   . Rectal polyp   . Mixed hyperlipidemia     Past Surgical History  Procedure Date  . Appendectomy   . Diagnostic laparoscopy   . Anal fissurectomy     Family History  Problem Relation Age of Onset  . Cancer Father     Lung  . Cancer Maternal Uncle     Colon    Social History Mr. Harrower reports that he has been smoking Cigarettes.  He has a 37.5 pack-year smoking history. He has never used smokeless tobacco. Mr. Los reports that he drinks alcohol.  Review of Systems Negative except as outlined above.  Physical Examination Filed Vitals:   12/26/11 0838  BP: 126/77  Pulse: 76   Overweight male in no acute distress. HEENT: Conjunctiva and lids normal, oropharynx clear. Neck: Supple, no elevated JVP or carotid bruits, no thyromegaly. Lungs: Clear to  auscultation, nonlabored breathing at rest. Cardiac: Regular rate and rhythm, no S3, no pericardial rub. Abdomen: Soft, nontender, bowel sounds present, no guarding or rebound. Extremities: No pitting edema, distal pulses 1+. Mild stasis. Skin: Warm and dry. Musculoskeletal: No kyphosis. Neuropsychiatric: Alert and oriented x3, affect grossly appropriate.    Problem List and Plan

## 2011-12-26 NOTE — Assessment & Plan Note (Signed)
Will arrange LE arterial Dopplers and ABI's. Walking regimen and smoking cessation discussed.

## 2011-12-26 NOTE — Patient Instructions (Signed)
**Note De-Identified  Obfuscation** Your physician has requested that you have an echocardiogram. Echocardiography is a painless test that uses sound waves to create images of your heart. It provides your doctor with information about the size and shape of your heart and how well your heart's chambers and valves are working. This procedure takes approximately one hour. There are no restrictions for this procedure.  Your physician has requested that you have a lower or upper extremity arterial duplex. This test is an ultrasound of the arteries in the legs or arms. It looks at arterial blood flow in the legs and arms. Allow one hour for Lower and Upper Arterial scans. There are no restrictions or special instructions  Your physician recommends that you return for lab work in: 5 weeks, just before next visit.  Your physician recommends that you schedule a follow-up appointment in: 6 weeks

## 2011-12-26 NOTE — Assessment & Plan Note (Signed)
LVEF was 35% in acute setting. Continue LifeVest (arranged by Dr. [name]) for now. Will reassess EF with echocardiogram in 6 weeks with visit.

## 2011-12-26 NOTE — Assessment & Plan Note (Signed)
Newly on statin. Recheck FLP and LFT in 6 weeks.

## 2011-12-28 ENCOUNTER — Ambulatory Visit (HOSPITAL_COMMUNITY)
Admission: RE | Admit: 2011-12-28 | Discharge: 2011-12-28 | Disposition: A | Discharge status: Home / Self Care | Payer: Medicaid Other | Source: Ambulatory Visit | Attending: Cardiology | Admitting: Cardiology

## 2011-12-28 DIAGNOSIS — I739 Peripheral vascular disease, unspecified: Secondary | ICD-10-CM | POA: Insufficient documentation

## 2011-12-28 DIAGNOSIS — E119 Type 2 diabetes mellitus without complications: Secondary | ICD-10-CM | POA: Insufficient documentation

## 2011-12-29 ENCOUNTER — Other Ambulatory Visit: Payer: Self-pay

## 2011-12-29 DIAGNOSIS — I739 Peripheral vascular disease, unspecified: Secondary | ICD-10-CM

## 2012-01-15 ENCOUNTER — Telehealth: Payer: Self-pay | Admitting: Cardiovascular Disease

## 2012-01-15 NOTE — Telephone Encounter (Signed)
New Msg: Pt calling wanting to know if he can get RX's switched over to generic so they are not so costly. Please return pt call to discuss further.

## 2012-01-15 NOTE — Telephone Encounter (Signed)
Call today to discuss 2 items. 1. Crestor too expensive.  Pharmacy suggested pravastatin, or simvastatin.     I will leave Crestor samples at desk.     Pt will discuss this with Dr. [name] at 01/24/12 appt.  2.  2 Cardiologists      Pt would like to see Dr. [name] for all cardiology needs. Instead of seeing 2. ( recently saw Dr. Diona Browner in [place])      Will discuss with Dr. [name] at office visit   Mylo Red RN

## 2012-01-15 NOTE — Telephone Encounter (Signed)
I will discuss at his f/u visit. cdm

## 2012-01-24 ENCOUNTER — Ambulatory Visit (INDEPENDENT_AMBULATORY_CARE_PROVIDER_SITE_OTHER): Payer: Self-pay | Admitting: Cardiovascular Disease

## 2012-01-24 ENCOUNTER — Encounter: Payer: Self-pay | Admitting: Cardiovascular Disease

## 2012-01-24 ENCOUNTER — Encounter: Payer: Self-pay | Admitting: *Deleted

## 2012-01-24 VITALS — BP 120/70 | HR 65 | Ht 68.0 in | Wt 207.0 lb

## 2012-01-24 DIAGNOSIS — E782 Mixed hyperlipidemia: Secondary | ICD-10-CM

## 2012-01-24 DIAGNOSIS — I255 Ischemic cardiomyopathy: Secondary | ICD-10-CM | POA: Insufficient documentation

## 2012-01-24 DIAGNOSIS — Z720 Tobacco use: Secondary | ICD-10-CM

## 2012-01-24 DIAGNOSIS — I2589 Other forms of chronic ischemic heart disease: Secondary | ICD-10-CM

## 2012-01-24 DIAGNOSIS — I739 Peripheral vascular disease, unspecified: Secondary | ICD-10-CM | POA: Insufficient documentation

## 2012-01-24 DIAGNOSIS — F17200 Nicotine dependence, unspecified, uncomplicated: Secondary | ICD-10-CM

## 2012-01-24 DIAGNOSIS — I779 Disorder of arteries and arterioles, unspecified: Secondary | ICD-10-CM

## 2012-01-24 DIAGNOSIS — I2510 Atherosclerotic heart disease of native coronary artery without angina pectoris: Secondary | ICD-10-CM

## 2012-01-24 LAB — CBC WITH DIFFERENTIAL/PLATELET
Basophils Relative: 0.7 % (ref 0.0–3.0)
Eosinophils Relative: 3 % (ref 0.0–5.0)
HCT: 42.7 % (ref 39.0–52.0)
Hemoglobin: 14.5 g/dL (ref 13.0–17.0)
Lymphs Abs: 3.2 10*3/uL (ref 0.7–4.0)
MCV: 95.4 fl (ref 78.0–100.0)
Monocytes Absolute: 0.8 10*3/uL (ref 0.1–1.0)
Neutrophils Relative %: 59.5 % (ref 43.0–77.0)
RBC: 4.48 Mil/uL (ref 4.22–5.81)
WBC: 11.1 10*3/uL — ABNORMAL HIGH (ref 4.5–10.5)

## 2012-01-24 LAB — BASIC METABOLIC PANEL
Chloride: 103 mEq/L (ref 96–112)
Potassium: 3.8 mEq/L (ref 3.5–5.1)
Sodium: 138 mEq/L (ref 135–145)

## 2012-01-24 LAB — PROTIME-INR: INR: 0.9 ratio (ref 0.8–1.0)

## 2012-01-24 MED ORDER — SIMVASTATIN 40 MG PO TABS: 40.0000 mg | ORAL_TABLET | Freq: Every evening | ORAL | Status: DC

## 2012-01-24 MED ORDER — ALPRAZOLAM 0.25 MG PO TABS: 0.2500 mg | ORAL_TABLET | Freq: Three times a day (TID) | ORAL | Status: DC | PRN

## 2012-01-24 NOTE — Assessment & Plan Note (Signed)
Stable. Continue current medical therapy. 

## 2012-01-24 NOTE — Assessment & Plan Note (Signed)
Stable. Continue Lifevest. Repeat echo 3 weeks. May need ICD if LvEF has not recovered. Continue current meds.

## 2012-01-24 NOTE — Assessment & Plan Note (Signed)
He likely has severe LE disease and possible left subclavian stenosis. Will arrange distal aortogram with bilateral LE runoff on 01/31/12 at [facility]. Risks and benefits reviewed with pt. He agrees to proceed. Pre cath labs today.

## 2012-01-24 NOTE — Progress Notes (Signed)
History of Present Illness: 46 yo WM with history of ongoing tobacco abuse, recent anterior STEMI January 2013 s/p DES LAD x 1, HLD here today for PV consult. He had presented to the [facility] ED on 12/12/11 with c/o chest pain. He was found to have anterior ST segment elevation and had cardiac arrest in the [facility] ED. He was transported emergently to [facility] where I met him in the cath lab. Cath showed subtotal occlusion of the mid LAD. This was treated with a PTCA and placement of a 2.75 x 32 mm Promus Element DES and post-dilated with a 3.0 mm balloon. The third OM branch was chronically occluded and the RCA had mild disease. His post MI LVEF was 35%. He did well post MI and was discharged home with a LifeVest. He has seen Dr. Diona Browner in our [place] office for post-hospital f/u and has been doing well from a cardiac standpoint.   In regards to his PV disease, he has had pain in both legs with walking. Non-invasive imaging with ABI of 0.53 on the right and 0.45 on the left with suggestion of aortoiliac inflow disease. There also appears to be left subclavian artery stenosis on non-invasive imaging. He tellls me that he has been feeling well with occasional chest pains. His breathing has been ok. He describes pain in both legs with walking. He can only walk 50 feet before his calf muscles cramp. This has been occurring for years. No skin changes on lower extremities or rest pain.   Primary Care Physician: None   Past Medical History  Diagnosis Date  . Coronary atherosclerosis of native coronary artery     DES LAD 1/13, LVEF 35%  . MI (myocardial infarction)     AMI 1/13 - complicated by VT/Tosades  . Chronic back pain   . Anal fissure   . Rectal polyp   . Mixed hyperlipidemia   . PAD (peripheral artery disease)     Past Surgical History  Procedure Date  . Appendectomy   . Diagnostic laparoscopy   . Anal fissurectomy     Current Outpatient Prescriptions  Medication Sig Dispense  Refill  . aspirin 81 MG tablet Take 160 mg by mouth daily.        . carvedilol (COREG) 6.25 MG tablet Take 1 tablet (6.25 mg total) by mouth 2 (two) times daily with a meal.  60 tablet  6  . enalapril (VASOTEC) 5 MG tablet Take 1 tablet (5 mg total) by mouth 2 (two) times daily.  60 tablet  6  . ibuprofen (ADVIL,MOTRIN) 200 MG tablet Take 400 mg by mouth every 6 (six) hours as needed. For pain       . nitroGLYCERIN (NITROSTAT) 0.4 MG SL tablet Place 1 tablet (0.4 mg total) under the tongue every 5 (five) minutes as needed for chest pain.  30 tablet  6  . prasugrel (EFFIENT) 10 MG TABS Take 1 tablet (10 mg total) by mouth daily.  30 tablet  0  . rosuvastatin (CRESTOR) 40 MG tablet Take 1 tablet (40 mg total) by mouth daily at 6 PM.  30 tablet  6  . spironolactone (ALDACTONE) 12.5 mg TABS Take 0.5 tablets (12.5 mg total) by mouth daily.  30 tablet  6    No Known Allergies  History   Social History  . Marital Status: Divorced    Spouse Name: N/A    Number of Children: N/A  . Years of Education: N/A   Occupational  History  .  Other   Social History Main Topics  . Smoking status: Current Everyday Smoker -- 1.5 packs/day for 25 years    Types: Cigarettes  . Smokeless tobacco: Never Used  . Alcohol Use: 0.0 oz/week     Weekends  . Drug Use: No  . Sexually Active: Not on file   Other Topics Concern  . Not on file   Social History Narrative   [employer] - physical labor  - works night shift    Family History  Problem Relation Age of Onset  . Cancer Father     Lung  . Cancer Maternal Uncle     Colon    Review of Systems:  As stated in the HPI and otherwise negative.   BP 120/70  Pulse 65  Ht 5\' 8"  (1.727 m)  Wt 207 lb (93.895 kg)  BMI 31.47 kg/m2  Physical Examination: General: Well developed, well nourished, NAD HEENT: OP clear, mucus membranes moist SKIN: warm, dry. No rashes. Neuro: No focal deficits Musculoskeletal: Muscle strength 5/5 all  ext Psychiatric: Mood and affect normal Neck: No JVD, no carotid bruits, no thyromegaly, no lymphadenopathy. Lungs:Clear bilaterally, no wheezes, rhonci, crackles Cardiovascular: Regular rate and rhythm. No murmurs, gallops or rubs. Abdomen:Soft. Bowel sounds present. Non-tender.  Extremities: No lower extremity edema. Pulses are non-palpable in the bilateral DP/PT.  EKG: NSR, rate 65 bpm. Septal infarct.   Cardiac Cath: 12/12/11:   Left main: NO disease.  Left Anterior Descending Artery: Large vessel that courses to the apex. The mid vessel beyond the diagonal has a 99% subtotal occlusion with TIMI 1 flow. The diagonal branch has no obstructive disease.  Circumflex Artery: Moderate sized vessel with two small OM branches. The third OM branch is totally occluded and fills from left to left collaterals. (chronic)  Right Coronary Artery: Large dominant vessel with mild plaque.  Left Ventricular Angiogram: LVEF 35%. Hypokinesis of the anterior wall and apex.  Impression:  1. Acute anterior STEMI secondary to subtotally occluded mid LAD  2. Successful PTCA with placement of a DES in the mid LAD  3. Chronically occluded third OM branch with collateral filling  4. Moderate LV systolic dysfunction  5. S/p cardiac arrest  Echo: 12/12/10:  Left ventricle: The cavity size was normal. Wall thickness was increased in a pattern of mild LVH. Systolic function was moderately reduced. The estimated ejection fraction was 35%. The mid to apical anteroseptal and anterior walls, the apical inferior wall, and the true apex were severely hypokinetic. Features are consistent with a pseudonormal left ventricular filling pattern, with concomitant abnormal relaxation and increased filling pressure (grade 2 diastolic dysfunction). - Aortic valve: There was no stenosis. - Mitral valve: No significant regurgitation. - Right ventricle: The cavity size was normal. Systolic function was normal. - Pulmonary  arteries: No complete TR doppler jet so unable to estimate PA systolic pressure. - Inferior vena cava: The vessel was normal in size; the respirophasic diameter changes were in the normal range (= 50%); findings are consistent with normal central venous pressure. Impressions:  - Normal LV size with moderate systolic dysfunction, EF 35%. See above for wall motion abnormalities, pattern suggestive of LAD disease. Moderate diastolic dysfunction. Normal RV size and systolic function. No significant valvular dysfunction.  Arterial dopplers 12/28/11:   Right ABI: 0.53  Left ABI: 0.45  Right Lower Extremity: Doppler wave forms are essentially monophasic throughout the right lower extremity at the femoral, superficial femoral, popliteal, posterior tibial and  dorsalis pedis levels. All segmental pressures are moderately depressed throughout the lower extremity. First toe pressure is 80 mmHg. Pulse volume curves are diffusely depressed.  Left Lower Extremity: Similar monophasic Doppler wave form pattern throughout the left lower extremity with moderately depressed segmental pressures throughout. First toe pressure could not be obtained. Pulse volume cores are diffusely depressed. Digital wave forms are also diffusely depressed.  Note was made of brachial artery pressure discrepancy with measured systolic pressure of 134 mmHg on the right and 79 mmHg on the left. This would imply proximal inflow disease to the left upper extremity. Correlation suggested with any symptoms suggestive of subclavian steal.  IMPRESSION:  1. Significantly depressed bilateral resting ankle brachial indices of 0.53 on the right and 0.45 on the left. Pattern of disease by segmental evaluation suggests primarily aorto-iliac inflow pattern of occlusive disease. 2. Significant blood pressure discrepancy in the upper extremities suggestive of left upper extremity arterial inflow disease, potentially at the  level of the left subclavian artery. Correlation is suggested with any clinical signs or symptoms of subclavian steal.

## 2012-01-24 NOTE — Assessment & Plan Note (Signed)
Smoking cessation encouraged. Will refill Xanax short term for assistance with agitation while stopping smoking.

## 2012-01-24 NOTE — Patient Instructions (Signed)
Your physician recommends that you schedule a follow-up appointment in: 1 month  Your physician has requested that you have an echocardiogram. Echocardiography is a painless test that uses sound waves to create images of your heart. It provides your doctor with information about the size and shape of your heart and how well your heart's chambers and valves are working. This procedure takes approximately one hour. There are no restrictions for this procedure. To be done in 4 weeks--few days prior to appt with Dr. [name]   Your physician recommends that you return for fasting lab work on day of appt with Dr. [name] in one month--Lipid and Liver profile.   Your physician has recommended you make the following change in your medication: Stop Crestor.  Start simvastatin 40 mg by mouth daily.   Your physician has requested that you have a peripheral vascular angiogram. This exam is performed at the hospital. During this exam IV contrast is used to look at arterial blood flow. Please review the information sheet given for details. Scheduled for Feb. 27, 2013.

## 2012-01-24 NOTE — Assessment & Plan Note (Signed)
He cannot afford Crestor. Will changed to simvastatin 40 mg po QHS. Recheck lipids and LFTs in 4 weeks.

## 2012-01-26 ENCOUNTER — Encounter (HOSPITAL_COMMUNITY): Payer: Self-pay | Admitting: Pharmacy Technician

## 2012-01-29 ENCOUNTER — Other Ambulatory Visit (HOSPITAL_COMMUNITY): Payer: Self-pay

## 2012-01-31 ENCOUNTER — Ambulatory Visit (HOSPITAL_COMMUNITY)
Admission: RE | Admit: 2012-01-31 | Discharge: 2012-01-31 | Disposition: A | Discharge status: Home / Self Care | Payer: Medicaid Other | Source: Ambulatory Visit | Attending: Cardiovascular Disease | Admitting: Cardiovascular Disease

## 2012-01-31 ENCOUNTER — Encounter (HOSPITAL_COMMUNITY)
Admission: RE | Disposition: A | Discharge status: Home / Self Care | Payer: Self-pay | Source: Ambulatory Visit | Attending: Cardiovascular Disease

## 2012-01-31 DIAGNOSIS — I70219 Atherosclerosis of native arteries of extremities with intermittent claudication, unspecified extremity: Secondary | ICD-10-CM

## 2012-01-31 DIAGNOSIS — I771 Stricture of artery: Secondary | ICD-10-CM | POA: Insufficient documentation

## 2012-01-31 DIAGNOSIS — I739 Peripheral vascular disease, unspecified: Secondary | ICD-10-CM

## 2012-01-31 DIAGNOSIS — I748 Embolism and thrombosis of other arteries: Secondary | ICD-10-CM | POA: Insufficient documentation

## 2012-01-31 DIAGNOSIS — I7409 Other arterial embolism and thrombosis of abdominal aorta: Secondary | ICD-10-CM | POA: Insufficient documentation

## 2012-01-31 DIAGNOSIS — I708 Atherosclerosis of other arteries: Secondary | ICD-10-CM

## 2012-01-31 HISTORY — PX: LOWER EXTREMITY ANGIOGRAM: SHX5508

## 2012-01-31 SURGERY — ANGIOGRAM, LOWER EXTREMITY
Anesthesia: LOCAL

## 2012-01-31 MED ORDER — SODIUM CHLORIDE 0.9 % IJ SOLN: 3.0000 mL | Freq: Two times a day (BID) | INTRAMUSCULAR | Status: DC

## 2012-01-31 MED ORDER — SODIUM CHLORIDE 0.9 % IV SOLN
INTRAVENOUS | Status: DC
  Administered 2012-01-31: 08:00:00 via INTRAVENOUS

## 2012-01-31 MED ORDER — FENTANYL CITRATE 0.05 MG/ML IJ SOLN
INTRAMUSCULAR | Status: AC
  Filled 2012-01-31: qty 2

## 2012-01-31 MED ORDER — SODIUM CHLORIDE 0.9 % IV SOLN: 250.0000 mL | INTRAVENOUS | Status: DC | PRN

## 2012-01-31 MED ORDER — HEPARIN (PORCINE) IN NACL 2-0.9 UNIT/ML-% IJ SOLN
INTRAMUSCULAR | Status: AC
  Filled 2012-01-31: qty 1000

## 2012-01-31 MED ORDER — MIDAZOLAM HCL 2 MG/2ML IJ SOLN
INTRAMUSCULAR | Status: AC
  Filled 2012-01-31: qty 2

## 2012-01-31 MED ORDER — LIDOCAINE HCL (PF) 1 % IJ SOLN
INTRAMUSCULAR | Status: AC
  Filled 2012-01-31: qty 30

## 2012-01-31 MED ORDER — ASPIRIN 81 MG PO CHEW
324.0000 mg | CHEWABLE_TABLET | ORAL | Status: AC
  Administered 2012-01-31: 324 mg via ORAL
  Filled 2012-01-31: qty 4

## 2012-01-31 MED ORDER — DIAZEPAM 5 MG PO TABS
5.0000 mg | ORAL_TABLET | ORAL | Status: AC
  Administered 2012-01-31: 5 mg via ORAL
  Filled 2012-01-31: qty 1

## 2012-01-31 MED ORDER — SODIUM CHLORIDE 0.9 % IJ SOLN: 3.0000 mL | INTRAMUSCULAR | Status: DC | PRN

## 2012-01-31 NOTE — Interval H&P Note (Signed)
History and Physical Interval Note:  01/31/2012 10:28 AM  Ryan [name]  has presented today for peripheral angiogram with distal aortogram with bilateral LE runoff as well as left subclavian artery stenosis  with the diagnosis of claudication  The various methods of treatment have been discussed with the patient and family. After consideration of risks, benefits and other options for treatment, the patient has consented to  Procedure(s) (LRB): LOWER EXTREMITY ANGIOGRAM (N/A) as a surgical intervention .  The patients' history has been reviewed, patient examined, no change in status, stable for surgery.  I have reviewed the patients' chart and labs.  Questions were answered to the patient's satisfaction.     [name]

## 2012-01-31 NOTE — Discharge Instructions (Signed)
Radial Site Care Refer to this sheet in the next few weeks. These instructions provide you with information on caring for yourself after your procedure. Your caregiver may also give you more specific instructions. Your treatment has been planned according to current medical practices, but problems sometimes occur. Call your caregiver if you have any problems or questions after your procedure. HOME CARE INSTRUCTIONS  You may shower the day after the procedure.Remove the bandage (dressing) and gently wash the site with plain soap and water.Gently pat the site dry.   Do not apply powder or lotion to the site.   Do not submerge the affected site in water for 3 to 5 days.   Inspect the site at least twice daily.   Do not flex or bend the affected arm for 24 hours.   No lifting over 5 pounds (2.3 kg) for 5 days after your procedure.   Do not drive home if you are discharged the same day of the procedure. Have someone else drive you.   You may drive 24 hours after the procedure unless otherwise instructed by your caregiver.  What to expect:  Any bruising will usually fade within 1 to 2 weeks.   Blood that collects in the tissue (hematoma) may be painful to the touch. It should usually decrease in size and tenderness within 1 to 2 weeks.  SEEK IMMEDIATE MEDICAL CARE IF:  You have unusual pain at the radial site.   You have redness, warmth, swelling, or pain at the radial site.   You have drainage (other than a small amount of blood on the dressing).   You have chills.   You have a fever or persistent symptoms for more than 72 hours.   You have a fever and your symptoms suddenly get worse.   Your arm becomes pale, cool, tingly, or numb.   You have heavy bleeding from the site. Hold pressure on the site.  Document Released: 12/23/2010 Document Revised: 08/02/2011 Document Reviewed: 12/23/2010 [employer] Patient Information 2012 [employer]. 

## 2012-01-31 NOTE — Op Note (Signed)
Cardiac Catheterization Operative Report  Ryan [name] [id number] 2/27/201311:20 AM No primary provider on file.  Procedure Performed:  1. Distal aortogram with visualization of the iliac arteries.  2. Aortic arch angiogram 3. Access right radial artery  Operator: [name], MD  Indication:  Severe claudication bilateral lower extremities with ABI moderately reduced bilateral lower extremities, severe pain and numbness left arm with possible left subclavian artery stenosis.                                       Procedure Details: The risks, benefits, complications, treatment options, and expected outcomes were discussed with the patient. The patient and/or family concurred with the proposed plan, giving informed consent. The patient was brought to the [facility] lab after IV hydration was begun and oral premedication was given. The patient was further sedated with Versed and Fentanyl. The patient had no pulses on the bilateral femoral arteries or the left radial artery. An Allens test was performed on the right radial artery and was positive. The right wrist was prepped and draped in the usual manner. 1% lidocaine was used for local anesthesia. Using the modified Seldinger access technique, a 5 French sheath was placed in the right radial artery. 3 mg IV Verapamil was given through the sheath. 5000 units IV heparin was given through the peripheral IV.  A pigtail catheter was used to perform a distal aortogram. The aorta was found to be totally occluded beyond the renal arteries. Another image was taken to visualize the iliac vessels which filled slowly from collateral flow and appeared to be patent. I then used a wire to pull the pigtail catheter back into the aortic arch and performed an aortic arch angiogram. The left subclavian artery was totally occluded. The catheter was removed. The sheath was removed. A terumo hemostasis band was applied to the right wrist arteriotomy site.   There  were no immediate complications. The patient was taken to the recovery area in stable condition.   Hemodynamic Findings: Central aortic pressure: 136/60  Angiographic Findings:  The bilateral renal arteries are patent.   The distal aorta is totally occluded just beyond the takeoff of the renal arteries. The iliac vessel are seen to fill slowly from collaterals. The legs were not imaged as there was not sufficient contrast flow to visualize.   Aortic arch angiogram demonstrating complete occlusion of the left subclavian artery proximally.   Impression: 1. Total occlusion of the distal aorta. 2. Total occlusion of the left subclavian artery. 3. Severe claudication in both legs and left arm.   Recommendations: Will continue current therapy. I have reviewed his films with one of my PV colleagues. He will need aorto-bifemoral bypass as well as subclavian artery bypass. We will have to delay this for now as he is recovering from his acute coronary syndrome and has a newly placed drug eluting stent in the LAD. He will need one year of dual antiplatelet therapy before it is interrupted. I am hopeful that we can manage his PAD conservatively for 10 more months. If he has ischemia in his legs/feet/arm then we may have to move toward surgery sooner. We will arrange follow up with Dr. Myra Gianotti in the [facility] office over the next month.        Complications:  None. The patient tolerated the procedure well.

## 2012-01-31 NOTE — H&P (View-Only) (Signed)
 History of Present Illness: 46 yo WM with history of ongoing tobacco abuse, recent anterior STEMI January 2013 s/p DES LAD x 1, HLD here today for PV consult. He had presented to the [facility] ED on 12/12/11 with c/o chest pain. He was found to have anterior ST segment elevation and had cardiac arrest in the [facility] ED. He was transported emergently to [place] where I met him in the cath lab. Cath showed subtotal occlusion of the mid LAD. This was treated with a PTCA and placement of a 2.75 x 32 mm Promus Element DES and post-dilated with a 3.0 mm balloon. The third OM branch was chronically occluded and the RCA had mild disease. His post MI LVEF was 35%. He did well post MI and was discharged home with a LifeVest. He has seen Dr. McDowell in our [place] office for post-hospital f/u and has been doing well from a cardiac standpoint.   In regards to his PV disease, he has had pain in both legs with walking. Non-invasive imaging with ABI of 0.53 on the right and 0.45 on the left with suggestion of aortoiliac inflow disease. There also appears to be left subclavian artery stenosis on non-invasive imaging. He tellls me that he has been feeling well with occasional chest pains. His breathing has been ok. He describes pain in both legs with walking. He can only walk 50 feet before his calf muscles cramp. This has been occurring for years. No skin changes on lower extremities or rest pain.   Primary Care Physician: None   Past Medical History  Diagnosis Date  . Coronary atherosclerosis of native coronary artery     DES LAD 1/13, LVEF 35%  . MI (myocardial infarction)     AMI 1/13 - complicated by VT/Tosades  . Chronic back pain   . Anal fissure   . Rectal polyp   . Mixed hyperlipidemia   . PAD (peripheral artery disease)     Past Surgical History  Procedure Date  . Appendectomy   . Diagnostic laparoscopy   . Anal fissurectomy     Current Outpatient Prescriptions  Medication Sig Dispense  Refill  . aspirin 81 MG tablet Take 160 mg by mouth daily.        . carvedilol (COREG) 6.25 MG tablet Take 1 tablet (6.25 mg total) by mouth 2 (two) times daily with a meal.  60 tablet  6  . enalapril (VASOTEC) 5 MG tablet Take 1 tablet (5 mg total) by mouth 2 (two) times daily.  60 tablet  6  . ibuprofen (ADVIL,MOTRIN) 200 MG tablet Take 400 mg by mouth every 6 (six) hours as needed. For pain       . nitroGLYCERIN (NITROSTAT) 0.4 MG SL tablet Place 1 tablet (0.4 mg total) under the tongue every 5 (five) minutes as needed for chest pain.  30 tablet  6  . prasugrel (EFFIENT) 10 MG TABS Take 1 tablet (10 mg total) by mouth daily.  30 tablet  0  . rosuvastatin (CRESTOR) 40 MG tablet Take 1 tablet (40 mg total) by mouth daily at 6 PM.  30 tablet  6  . spironolactone (ALDACTONE) 12.5 mg TABS Take 0.5 tablets (12.5 mg total) by mouth daily.  30 tablet  6    No Known Allergies  History   Social History  . Marital Status: Divorced    Spouse Name: N/A    Number of Children: N/A  . Years of Education: N/A   Occupational   History  .  Other   Social History Main Topics  . Smoking status: Current Everyday Smoker -- 1.5 packs/day for 25 years    Types: Cigarettes  . Smokeless tobacco: Never Used  . Alcohol Use: 0.0 oz/week     Weekends  . Drug Use: No  . Sexually Active: Not on file   Other Topics Concern  . Not on file   Social History Narrative   [employer] - physical labor  - works night shift    Family History  Problem Relation Age of Onset  . Cancer Father     Lung  . Cancer Maternal Uncle     Colon    Review of Systems:  As stated in the HPI and otherwise negative.   BP 120/70  Pulse 65  Ht 5' 8" (1.727 m)  Wt 207 lb (93.895 kg)  BMI 31.47 kg/m2  Physical Examination: General: Well developed, well nourished, NAD HEENT: OP clear, mucus membranes moist SKIN: warm, dry. No rashes. Neuro: No focal deficits Musculoskeletal: Muscle strength 5/5 all  ext Psychiatric: Mood and affect normal Neck: No JVD, no carotid bruits, no thyromegaly, no lymphadenopathy. Lungs:Clear bilaterally, no wheezes, rhonci, crackles Cardiovascular: Regular rate and rhythm. No murmurs, gallops or rubs. Abdomen:Soft. Bowel sounds present. Non-tender.  Extremities: No lower extremity edema. Pulses are non-palpable in the bilateral DP/PT.  EKG: NSR, rate 65 bpm. Septal infarct.   Cardiac Cath: 12/12/11:   Left main: NO disease.  Left Anterior Descending Artery: Large vessel that courses to the apex. The mid vessel beyond the diagonal has a 99% subtotal occlusion with TIMI 1 flow. The diagonal branch has no obstructive disease.  Circumflex Artery: Moderate sized vessel with two small OM branches. The third OM branch is totally occluded and fills from left to left collaterals. (chronic)  Right Coronary Artery: Large dominant vessel with mild plaque.  Left Ventricular Angiogram: LVEF 35%. Hypokinesis of the anterior wall and apex.  Impression:  1. Acute anterior STEMI secondary to subtotally occluded mid LAD  2. Successful PTCA with placement of a DES in the mid LAD  3. Chronically occluded third OM branch with collateral filling  4. Moderate LV systolic dysfunction  5. S/p cardiac arrest  Echo: 12/12/10:  Left ventricle: The cavity size was normal. Wall thickness was increased in a pattern of mild LVH. Systolic function was moderately reduced. The estimated ejection fraction was 35%. The mid to apical anteroseptal and anterior walls, the apical inferior wall, and the true apex were severely hypokinetic. Features are consistent with a pseudonormal left ventricular filling pattern, with concomitant abnormal relaxation and increased filling pressure (grade 2 diastolic dysfunction). - Aortic valve: There was no stenosis. - Mitral valve: No significant regurgitation. - Right ventricle: The cavity size was normal. Systolic function was normal. - Pulmonary  arteries: No complete TR doppler jet so unable to estimate PA systolic pressure. - Inferior vena cava: The vessel was normal in size; the respirophasic diameter changes were in the normal range (= 50%); findings are consistent with normal central venous pressure. Impressions:  - Normal LV size with moderate systolic dysfunction, EF 35%. See above for wall motion abnormalities, pattern suggestive of LAD disease. Moderate diastolic dysfunction. Normal RV size and systolic function. No significant valvular dysfunction.  Arterial dopplers 12/28/11:   Right ABI: 0.53  Left ABI: 0.45  Right Lower Extremity: Doppler wave forms are essentially monophasic throughout the right lower extremity at the femoral, superficial femoral, popliteal, posterior tibial and   dorsalis pedis levels. All segmental pressures are moderately depressed throughout the lower extremity. First toe pressure is 80 mmHg. Pulse volume curves are diffusely depressed.  Left Lower Extremity: Similar monophasic Doppler wave form pattern throughout the left lower extremity with moderately depressed segmental pressures throughout. First toe pressure could not be obtained. Pulse volume cores are diffusely depressed. Digital wave forms are also diffusely depressed.  Note was made of brachial artery pressure discrepancy with measured systolic pressure of 134 mmHg on the right and 79 mmHg on the left. This would imply proximal inflow disease to the left upper extremity. Correlation suggested with any symptoms suggestive of subclavian steal.  IMPRESSION:  1. Significantly depressed bilateral resting ankle brachial indices of 0.53 on the right and 0.45 on the left. Pattern of disease by segmental evaluation suggests primarily aorto-iliac inflow pattern of occlusive disease. 2. Significant blood pressure discrepancy in the upper extremities suggestive of left upper extremity arterial inflow disease, potentially at the  level of the left subclavian artery. Correlation is suggested with any clinical signs or symptoms of subclavian steal.        

## 2012-02-05 ENCOUNTER — Ambulatory Visit: Payer: Self-pay | Admitting: Cardiology

## 2012-02-15 ENCOUNTER — Encounter: Payer: Self-pay | Admitting: Cardiovascular Disease

## 2012-02-16 ENCOUNTER — Ambulatory Visit (HOSPITAL_COMMUNITY): Payer: Medicaid Other | Attending: Internal Medicine

## 2012-02-16 ENCOUNTER — Other Ambulatory Visit: Payer: Self-pay

## 2012-02-16 DIAGNOSIS — I2510 Atherosclerotic heart disease of native coronary artery without angina pectoris: Secondary | ICD-10-CM

## 2012-02-16 DIAGNOSIS — E785 Hyperlipidemia, unspecified: Secondary | ICD-10-CM | POA: Insufficient documentation

## 2012-02-16 DIAGNOSIS — I2589 Other forms of chronic ischemic heart disease: Secondary | ICD-10-CM | POA: Insufficient documentation

## 2012-02-20 ENCOUNTER — Other Ambulatory Visit: Payer: Self-pay

## 2012-02-20 ENCOUNTER — Ambulatory Visit (INDEPENDENT_AMBULATORY_CARE_PROVIDER_SITE_OTHER): Payer: Self-pay | Admitting: Cardiovascular Disease

## 2012-02-20 ENCOUNTER — Encounter: Payer: Self-pay | Admitting: Cardiovascular Disease

## 2012-02-20 VITALS — BP 125/74 | HR 85 | Ht 68.0 in | Wt 207.0 lb

## 2012-02-20 DIAGNOSIS — I779 Disorder of arteries and arterioles, unspecified: Secondary | ICD-10-CM

## 2012-02-20 DIAGNOSIS — I2510 Atherosclerotic heart disease of native coronary artery without angina pectoris: Secondary | ICD-10-CM

## 2012-02-20 DIAGNOSIS — I2589 Other forms of chronic ischemic heart disease: Secondary | ICD-10-CM

## 2012-02-20 DIAGNOSIS — I255 Ischemic cardiomyopathy: Secondary | ICD-10-CM

## 2012-02-20 DIAGNOSIS — I739 Peripheral vascular disease, unspecified: Secondary | ICD-10-CM

## 2012-02-20 MED ORDER — OXYCODONE-ACETAMINOPHEN 5-325 MG PO TABS: ORAL_TABLET | ORAL | Status: DC

## 2012-02-20 MED ORDER — CILOSTAZOL 100 MG PO TABS: 100.0000 mg | ORAL_TABLET | Freq: Two times a day (BID) | ORAL | Status: DC

## 2012-02-20 NOTE — Patient Instructions (Signed)
Your physician wants you to follow-up in :6 months.  You will receive a reminder letter in the mail two months in advance. If you don't receive a letter, please call our office to schedule the follow-up appointment.  Your physician has recommended you make the following change in your medication: Start Pletal 100 mg by mouth twice daily.  You may remove the life vest you are wearing.

## 2012-02-20 NOTE — Progress Notes (Addendum)
History of Present Illness: 46 yo WM with history of ongoing tobacco abuse, recent anterior STEMI January 2013 s/p DES LAD x 1, HLD, PAD here today for PV and cardiac f/u. He had presented to the [facility] ED on 12/12/11 with c/o chest pain. He was found to have anterior ST segment elevation and had cardiac arrest in the [facility] ED. He was transported emergently to [facility] where I met him in the cath lab. Cath showed subtotal occlusion of the mid LAD. This was treated with a PTCA and placement of a 2.75 x 32 mm Promus Element DES and post-dilated with a 3.0 mm balloon. The third OM branch was chronically occluded and the RCA had mild disease. His post MI LVEF was 35%. He did well post MI and was discharged home with a LifeVest. He has seen Dr. Diona Browner in our [place] office for post-hospital f/u and has been doing well from a cardiac standpoint. I saw him 01/24/12 for PV visit and he had c/o pain in both legs with walking. Non-invasive imaging with ABI of 0.53 on the right and 0.45 on the left with suggestion of aortoiliac inflow disease. There also appeared to be left subclavian artery stenosis on non-invasive imaging. He had no rest pain. I arranged a distal aortogram with bilateral lower ext runoff and left subclavian angiography on 01/31/12. His distal aorta was occluded just distal to the renals. His left subclavian is occluded at the ostium. I reviewed his films with vascular surgery but he is not a good candidate for aorto-bifemoral bypass at this time since he is on Effient. Recent echo with normal LV function. He is still wearing his Lifevest.   He has been doing well. He has had no chest pain or SOB. He has cut back to 5 cigarettes per day. His legs are hurting with minimal walking. He can walk twenty feet both legs and calf muscles ache. NO rest pain or ulcerations. His left arm aches with activity. Taking all meds.   Primary Care Physician: None  Last Lipid Profile: Total chol: 182   HDL 34    LDL 105. 02/15/12  Past Medical History  Diagnosis Date  . Coronary atherosclerosis of native coronary artery     DES LAD 1/13, LVEF 35%  . MI (myocardial infarction)     AMI 1/13 - complicated by VT/Tosades  . Chronic back pain   . Anal fissure   . Rectal polyp   . Mixed hyperlipidemia   . PAD (peripheral artery disease)     Past Surgical History  Procedure Date  . Appendectomy   . Diagnostic laparoscopy   . Anal fissurectomy     Current Outpatient Prescriptions  Medication Sig Dispense Refill  . ALPRAZolam (XANAX) 0.25 MG tablet Take 0.25 mg by mouth 4 (four) times daily as needed. For nerves      . aspirin 81 MG tablet Take 160 mg by mouth daily.       . carvedilol (COREG) 6.25 MG tablet Take 6.25 mg by mouth 2 (two) times daily with a meal.      . enalapril (VASOTEC) 5 MG tablet Take 5 mg by mouth 2 (two) times daily.      Marland Kitchen ibuprofen (ADVIL,MOTRIN) 200 MG tablet Take 200 mg by mouth every 6 (six) hours as needed. For pain      . nitroGLYCERIN (NITROSTAT) 0.4 MG SL tablet Place 0.4 mg under the tongue every 5 (five) minutes as needed. For chest pain      .  prasugrel (EFFIENT) 10 MG TABS Take 10 mg by mouth daily.      . simvastatin (ZOCOR) 40 MG tablet Take 40 mg by mouth every evening.      Marland Kitchen spironolactone (ALDACTONE) 25 MG tablet Take 6.25 mg by mouth daily.      . cilostazol (PLETAL) 100 MG tablet Take 1 tablet (100 mg total) by mouth 2 (two) times daily.  60 tablet  6  . oxyCODONE-acetaminophen (ROXICET) 5-325 MG per tablet Take 1-2 tablets by mouth every 6 hours as needed for pain  60 tablet  0    No Known Allergies  History   Social History  . Marital Status: Divorced    Spouse Name: N/A    Number of Children: N/A  . Years of Education: N/A   Occupational History  .  Other   Social History Main Topics  . Smoking status: Current Everyday Smoker -- 1.5 packs/day for 25 years    Types: Cigarettes  . Smokeless tobacco: Never Used  . Alcohol Use: 0.0 oz/week      Weekends  . Drug Use: No  . Sexually Active: Not on file   Other Topics Concern  . Not on file   Social History Narrative   [employer] - physical labor  - works night shift    Family History  Problem Relation Age of Onset  . Cancer Father     Lung  . Cancer Maternal Uncle     Colon    Review of Systems:  As stated in the HPI and otherwise negative.   BP 125/74  Pulse 85  Ht 5\' 8"  (1.727 m)  Wt 207 lb (93.895 kg)  BMI 31.47 kg/m2  Physical Examination: General: Well developed, well nourished, NAD HEENT: OP clear, mucus membranes moist SKIN: warm, dry. No rashes. Neuro: No focal deficits Musculoskeletal: Muscle strength 5/5 all ext Psychiatric: Mood and affect normal Neck: No JVD, no carotid bruits, no thyromegaly, no lymphadenopathy. Lungs:Clear bilaterally, no wheezes, rhonci, crackles Cardiovascular: Regular rate and rhythm. No murmurs, gallops or rubs. Abdomen:Soft. Bowel sounds present. Non-tender.  Extremities: No lower extremity edema. Pulses are non-palpable in the bilateral DP/PT.  EKG:  Distal aortogram 01/31/12:  Angiographic Findings:  The bilateral renal arteries are patent.  The distal aorta is totally occluded just beyond the takeoff of the renal arteries. The iliac vessel are seen to fill slowly from collaterals. The legs were not imaged as there was not sufficient contrast flow to visualize.  Aortic arch angiogram demonstrating complete occlusion of the left subclavian artery proximally.  Impression:  1. Total occlusion of the distal aorta.  2. Total occlusion of the left subclavian artery.  3. Severe claudication in both legs and left arm.   Echo: 02/16/12: Left ventricle: The cavity size was normal. Wall thickness was increased in a pattern of mild LVH. The estimated ejection fraction was 55%. - Left atrium: The atrium was mildly dilated. - Atrial septum: No defect or patent foramen ovale was identified.

## 2012-02-20 NOTE — Assessment & Plan Note (Addendum)
Stable post PCI with DES x 1 LAD. He is on good therapy. He will need at least one year of ASA and Effient. (samples given today). Continue beta blocker, Ace-inh, statin.

## 2012-02-20 NOTE — Assessment & Plan Note (Signed)
He has severe PAD with occluded distal aorta and occluded left subclavian artery. He will need aorto-bifemoral bypass and left subclavian bypass but this will have to be delayed until January  of 2014 unless he develops limb threatening ischemia in his extremities. Will start Pletal. Will give him Percocet for his leg pain to use prn. He will see Dr. Myra Gianotti next week for surgical opinion.

## 2012-02-20 NOTE — Assessment & Plan Note (Signed)
His LVEF is now normal. Will d/o LifeVest. He will not need an ICD at this time. Continue current therapy. Will d/c aldactone at next visit.

## 2012-02-23 ENCOUNTER — Encounter: Payer: Self-pay | Admitting: Surgery

## 2012-02-26 ENCOUNTER — Other Ambulatory Visit (INDEPENDENT_AMBULATORY_CARE_PROVIDER_SITE_OTHER): Payer: Self-pay | Admitting: *Deleted

## 2012-02-26 ENCOUNTER — Ambulatory Visit (INDEPENDENT_AMBULATORY_CARE_PROVIDER_SITE_OTHER): Payer: Self-pay | Admitting: Surgery

## 2012-02-26 ENCOUNTER — Encounter: Payer: Self-pay | Admitting: Surgery

## 2012-02-26 VITALS — BP 113/68 | HR 94 | Resp 18 | Ht 69.0 in | Wt 207.0 lb

## 2012-02-26 DIAGNOSIS — Z01810 Encounter for preprocedural cardiovascular examination: Secondary | ICD-10-CM

## 2012-02-26 DIAGNOSIS — I6529 Occlusion and stenosis of unspecified carotid artery: Secondary | ICD-10-CM

## 2012-02-26 DIAGNOSIS — I7092 Chronic total occlusion of artery of the extremities: Secondary | ICD-10-CM

## 2012-02-26 HISTORY — DX: Chronic total occlusion of artery of the extremities: I70.92

## 2012-02-26 NOTE — Progress Notes (Signed)
[facility] [place]   Patient name: Ryan [name] MRN: [id number] DOB: 08-Dec-1965 Sex: male   Referred by: Dr. [name]  Reason for referral:  Chief Complaint  Patient presents with  . PAD    REF-->> Dr. [name] ,    had Cardiac Cath in January.     HISTORY OF PRESENT ILLNESS: This is a very pleasant gentleman I am seeing for evaluation of peripheral vascular disease. He is status post anterior MI in January of 2013. He underwent drug-eluting stent placement to his LAD. From a cardiac perspective he has been doing well. The patient in February was complaining of bilateral lower extremity leg pain. Noninvasive imaging shows an ABI of 0.53 on the right and 0.45 on the left. There is also concerns of her left subclavian artery stenosis. He underwent abdominal aortogram and subclavian angiogram on 01/31/2012. This revealed aortic occlusion at the level of the renal arteries. He was also found to have a left subclavian artery occlusion at its origin. The patient denies having rest pain although he does have some numbness occasionally at night in his feet. He states that he can walk approximately 20-40 feet before his legs start affecting him and he has to stop. His symptoms are alleviated with rest. He did not have any ulceration. He does complain that his left arm won't go numb occasionally in his first 3 fingers. He does not have any open wounds on his left hand. He has no neurologic symptoms.  The patient is also medically managed for his hyperlipidemia. He continues to be a smoker but has significantly cut down and is trying to quit.  Past Medical History  Diagnosis Date  . Coronary atherosclerosis of native coronary artery     DES LAD 1/13, LVEF 35%  . MI (myocardial infarction)     AMI 1/13 - complicated by VT/Tosades  . Chronic back pain   . Anal fissure   . Rectal polyp   . Mixed hyperlipidemia   . PAD (peripheral artery disease)     Past Surgical History    Procedure Date  . Appendectomy   . Diagnostic laparoscopy   . Anal fissurectomy     History   Social History  . Marital Status: Divorced    Spouse Name: N/A    Number of Children: N/A  . Years of Education: N/A   Occupational History  .  Other   Social History Main Topics  . Smoking status: Current Everyday Smoker -- 0.2 packs/day for 25 years    Types: Cigarettes  . Smokeless tobacco: Never Used  . Alcohol Use: 0.0 oz/week     Weekends  . Drug Use: No  . Sexually Active: Not on file   Other Topics Concern  . Not on file   Social History Narrative   [employer] - physical labor  - works night shift    Family History  Problem Relation Age of Onset  . Cancer Father     Lung  . Cancer Maternal Uncle     Colon  . Diabetes Mother   . COPD Mother     Allergies as of 02/26/2012  . (No Known Allergies)    Current Outpatient Prescriptions on File Prior to Visit  Medication Sig Dispense Refill  . aspirin 81 MG tablet Take 160 mg by mouth daily.       . carvedilol (COREG) 6.25 MG tablet Take 6.25 mg by mouth 2 (two) times daily with a  meal.      . cilostazol (PLETAL) 100 MG tablet Take 1 tablet (100 mg total) by mouth 2 (two) times daily.  60 tablet  6  . enalapril (VASOTEC) 5 MG tablet Take 5 mg by mouth 2 (two) times daily.      Marland Kitchen ibuprofen (ADVIL,MOTRIN) 200 MG tablet Take 200 mg by mouth every 6 (six) hours as needed. For pain      . nitroGLYCERIN (NITROSTAT) 0.4 MG SL tablet Place 0.4 mg under the tongue every 5 (five) minutes as needed. For chest pain      . oxyCODONE-acetaminophen (ROXICET) 5-325 MG per tablet Take 1-2 tablets by mouth every 6 hours as needed for pain  60 tablet  0  . prasugrel (EFFIENT) 10 MG TABS Take 10 mg by mouth daily.      . simvastatin (ZOCOR) 40 MG tablet Take 40 mg by mouth every evening.      Marland Kitchen spironolactone (ALDACTONE) 25 MG tablet Take 6.25 mg by mouth daily.         REVIEW OF SYSTEMS: Cardiovascular: See history  of present illness Pulmonary: No productive cough, asthma or wheezing. Neurologic: No weakness, paresthesias, aphasia, or amaurosis. No dizziness. Hematologic: No bleeding problems or clotting disorders. Musculoskeletal: No joint pain or joint swelling. Gastrointestinal: No blood in stool or hematemesis Genitourinary: No dysuria or hematuria. Psychiatric:: No history of major depression. Integumentary: No rashes or ulcers. Constitutional: No fever or chills.  PHYSICAL EXAMINATION: General: The patient appears their stated age.  Vital signs are BP 113/68  Pulse 94  Resp 18  Ht 5\' 9"  (1.753 m)  Wt 207 lb (93.895 kg)  BMI 30.57 kg/m2  SpO2 99% HEENT:  No gross abnormalities Pulmonary: Respirations are non-labored Abdomen: Soft and non-tender  Musculoskeletal: There are no major deformities.  Appendectomy scar well healed Neurologic: No focal weakness or paresthesias are detected, Skin: There are no ulcer or rashes noted. Psychiatric: The patient has normal affect. Cardiovascular: There is a regular rate and rhythm without significant murmur appreciated. No carotid bruits. Femoral popliteal and pedal pulses are not palpable  Diagnostic Studies: Carotid Dopplers:    Assessment:  Bilateral claudication Plan: The patient suffers from lifestyle limiting claudication in both lower extremities. However, he is recently status post myocardial infarction with drug-eluting stent placement. He is maintained on anti-platelet therapy which cannot be stopped due to risk of stent thrombosis. For that reason we are left and manage the patient medically for his claudication until he gets to a point where his antiplatelet therapy can be stopped. At that time he will require aortobifemoral bypass grafting. The patient has been on Pletal for 1 week. He has not had any significant change in his symptoms. I have encouraged him to continue this for one month. If at that time he is not getting any benefit it  would be okay to stop this. He states that his symptoms in his legs far exceed the symptoms in his left arm therefore we would proceed with lower extremity revascularization first. Because of the patient's significant atherosclerotic disease I'm going to obtain carotid Dopplers today. I will plan on seeing the patient back in 6 months to update me on his progress. I have encouraged him to continue to try to stop smoking. We also discussed the advantage of 8 exercise program and continuing to person his activity level to improve his collateral circulation.     Jorge Ny, M.D. [facility] [place] Office: [phone]  Pager:  [phone]

## 2012-02-29 ENCOUNTER — Ambulatory Visit (HOSPITAL_COMMUNITY): Payer: Self-pay

## 2012-03-04 ENCOUNTER — Ambulatory Visit (HOSPITAL_COMMUNITY): Payer: Self-pay

## 2012-03-06 ENCOUNTER — Ambulatory Visit (HOSPITAL_COMMUNITY): Payer: Self-pay

## 2012-03-08 ENCOUNTER — Ambulatory Visit (HOSPITAL_COMMUNITY): Payer: Self-pay

## 2012-03-08 ENCOUNTER — Telehealth: Payer: Self-pay | Admitting: Cardiovascular Disease

## 2012-03-08 NOTE — Telephone Encounter (Signed)
New msg Pt called and had some questions about study he is doing? I didn't understand what he wanted. Please call him back

## 2012-03-08 NOTE — Telephone Encounter (Signed)
Called patient back and lmom for him to call us back

## 2012-03-11 ENCOUNTER — Ambulatory Visit (HOSPITAL_COMMUNITY): Payer: Self-pay

## 2012-03-11 NOTE — Procedures (Unsigned)
CAROTID DUPLEX EXAM  INDICATION:  Carotid stenosis  HISTORY: Diabetes:  No Cardiac:  Stent 12/12/2011, MI Hypertension:  No Smoking:  Yes Previous Surgery:  No CV History:  Currently asymptomatic Amaurosis Fugax No, Paresthesias No, Hemiparesis No                                      RIGHT             LEFT Brachial systolic pressure:         118               85 Brachial Doppler waveforms:         Within normal limits                Biphasic Vertebral direction of flow:        Antegrade         Antegrade DUPLEX VELOCITIES (cm/sec) CCA peak systolic                   116               120 ECA peak systolic                   127               147 ICA peak systolic                   95                69 ICA end diastolic                   36                35 PLAQUE MORPHOLOGY:                  Mixed             Heterogeneous PLAQUE AMOUNT:                      Minimal           Minimal PLAQUE LOCATION:                    ICA, bifurcation  ICA, bifurcation  IMPRESSION: 1. Bilateral internal carotid artery velocities suggest 1%-39%     stenosis. 2. Antegrade vertebral arteries bilaterally. 3. Brachial pressure difference noted.  ___________________________________________ V. Charlena Cross, MD  EM/MEDQ  D:  02/26/2012  T:  02/26/2012  Job:  [id number]

## 2012-03-12 NOTE — Telephone Encounter (Signed)
Spoke with Darl Pikes in [employer]. Pt is in accelerate study. Will route note to Cherrie Distance, RN to follow up with pt

## 2012-03-12 NOTE — Telephone Encounter (Signed)
Called and spoke with pt's significant other.

## 2012-03-13 ENCOUNTER — Ambulatory Visit (HOSPITAL_COMMUNITY): Payer: Self-pay

## 2012-03-15 ENCOUNTER — Ambulatory Visit (HOSPITAL_COMMUNITY): Payer: Self-pay

## 2012-03-18 ENCOUNTER — Ambulatory Visit (HOSPITAL_COMMUNITY): Payer: Self-pay

## 2012-03-18 ENCOUNTER — Telehealth: Payer: Self-pay | Admitting: Cardiovascular Disease

## 2012-03-18 DIAGNOSIS — I739 Peripheral vascular disease, unspecified: Secondary | ICD-10-CM

## 2012-03-18 MED ORDER — OXYCODONE-ACETAMINOPHEN 5-325 MG PO TABS: ORAL_TABLET | ORAL | Status: DC

## 2012-03-18 NOTE — Telephone Encounter (Signed)
Spoke with pt. He is taking 1-2 oxycodone tablets daily for leg pain and needs refill.  I reviewed with Dr. [name] and will refill for 60 tablets. Effient 10 mg samples - 21 tablets--Exp 09/13--Lot [id number] left at front desk. Pt is in process of getting Medicaid which he states will cover Effient cost. He will contact me about filling out assistance program paperwork for Effient if he is not able to get Medicaid or other assistance. Pt aware prescription and samples will be at front desk for pick up.

## 2012-03-18 NOTE — Telephone Encounter (Signed)
Follow-up:    Patient called back saying that the prescription would need to be written out and he needs to bring it in person.  Please call back once the prescription is ready for pickup.

## 2012-03-18 NOTE — Telephone Encounter (Signed)
Please return call to patient at [phone], concerning Hydrocodone Meds, and Effient samples

## 2012-03-20 ENCOUNTER — Ambulatory Visit (HOSPITAL_COMMUNITY): Payer: Self-pay

## 2012-03-22 ENCOUNTER — Ambulatory Visit (HOSPITAL_COMMUNITY): Payer: Self-pay

## 2012-03-25 ENCOUNTER — Ambulatory Visit (HOSPITAL_COMMUNITY): Payer: Self-pay

## 2012-03-27 ENCOUNTER — Ambulatory Visit (HOSPITAL_COMMUNITY): Payer: Self-pay

## 2012-03-29 ENCOUNTER — Ambulatory Visit (HOSPITAL_COMMUNITY): Payer: Self-pay

## 2012-04-01 ENCOUNTER — Ambulatory Visit (HOSPITAL_COMMUNITY): Payer: Self-pay

## 2012-04-03 ENCOUNTER — Ambulatory Visit (HOSPITAL_COMMUNITY): Payer: Self-pay

## 2012-04-05 ENCOUNTER — Ambulatory Visit (HOSPITAL_COMMUNITY): Payer: Self-pay

## 2012-04-08 ENCOUNTER — Ambulatory Visit (HOSPITAL_COMMUNITY): Payer: Self-pay

## 2012-04-10 ENCOUNTER — Ambulatory Visit (HOSPITAL_COMMUNITY): Payer: Self-pay

## 2012-04-12 ENCOUNTER — Ambulatory Visit (HOSPITAL_COMMUNITY): Payer: Self-pay

## 2012-04-15 ENCOUNTER — Telehealth: Payer: Self-pay | Admitting: Cardiovascular Disease

## 2012-04-15 ENCOUNTER — Ambulatory Visit (HOSPITAL_COMMUNITY): Payer: Self-pay

## 2012-04-15 DIAGNOSIS — I739 Peripheral vascular disease, unspecified: Secondary | ICD-10-CM

## 2012-04-15 NOTE — Telephone Encounter (Signed)
Pt requesting written rx for  percocet [phone]

## 2012-04-16 MED ORDER — OXYCODONE-ACETAMINOPHEN 5-325 MG PO TABS: ORAL_TABLET | ORAL | Status: DC

## 2012-04-16 NOTE — Telephone Encounter (Signed)
Reviewed with Dr. [name] and OK to refill percocet for 60 tablets. Pt notified prescription will be at front desk for him to pick up today.

## 2012-04-16 NOTE — Telephone Encounter (Signed)
Fu call Pt was calling about status of written rx

## 2012-04-17 ENCOUNTER — Ambulatory Visit (HOSPITAL_COMMUNITY): Payer: Self-pay

## 2012-04-19 ENCOUNTER — Ambulatory Visit (HOSPITAL_COMMUNITY): Payer: Self-pay

## 2012-04-22 ENCOUNTER — Ambulatory Visit (HOSPITAL_COMMUNITY): Payer: Self-pay

## 2012-04-24 ENCOUNTER — Ambulatory Visit (HOSPITAL_COMMUNITY): Payer: Self-pay

## 2012-04-26 ENCOUNTER — Ambulatory Visit (HOSPITAL_COMMUNITY): Payer: Self-pay

## 2012-05-01 ENCOUNTER — Ambulatory Visit (HOSPITAL_COMMUNITY): Payer: Self-pay

## 2012-05-03 ENCOUNTER — Ambulatory Visit (HOSPITAL_COMMUNITY): Payer: Self-pay

## 2012-05-06 ENCOUNTER — Ambulatory Visit (HOSPITAL_COMMUNITY): Payer: Self-pay

## 2012-05-08 ENCOUNTER — Telehealth: Payer: Self-pay | Admitting: Cardiovascular Disease

## 2012-05-08 ENCOUNTER — Ambulatory Visit (HOSPITAL_COMMUNITY): Payer: Self-pay

## 2012-05-08 DIAGNOSIS — I739 Peripheral vascular disease, unspecified: Secondary | ICD-10-CM

## 2012-05-08 NOTE — Telephone Encounter (Signed)
Patient called to see when he can stop taken the Effient to have a tooth extraction. Patient does not have an appointment to have the tooth pull nor he has a regular dentist. Patient also said he is running out of his pain medication Oxycodone ( a prescription was given 04/16/12 for 60 pills) and he would like to have a refill.

## 2012-05-08 NOTE — Telephone Encounter (Signed)
Pt needs tooth pulled and wants to know what to do re blood thinner? Also pt has been taking pain med for tooth and would like another rx for oxycodone?

## 2012-05-09 MED ORDER — OXYCODONE-ACETAMINOPHEN 5-325 MG PO TABS: ORAL_TABLET | ORAL | Status: DC

## 2012-05-09 NOTE — Telephone Encounter (Signed)
I spoke with the pt's wife and made her aware that the pt cannot hold Effient until January of 2014.  She also said that the pt is completely out of oxycodone at this time.  I will forward this information back to Dr [name] for further orders.

## 2012-05-09 NOTE — Telephone Encounter (Signed)
He is on ASA and Effient for a drug eluting stent placed in January 2013. He should not stop his Effient until after January of 2014 for a tooth extraction. We can refill his oxycodone but I am not in the office today to sign the prescription. Can we find out when he is going to run out of the oxycodone? He is given this by our office because of his severe PAD that cannot be surgically fixed until February 2014. Thanks, chris

## 2012-05-09 NOTE — Telephone Encounter (Signed)
Thanks

## 2012-05-09 NOTE — Telephone Encounter (Signed)
Lauren, I have no problem filling it for him. I am in the cath lab and may have time to run over this afternoon if you wouldn't mind printing it out. Thanks, chris

## 2012-05-09 NOTE — Telephone Encounter (Signed)
Prescription printed

## 2012-05-10 ENCOUNTER — Ambulatory Visit (HOSPITAL_COMMUNITY): Payer: Self-pay

## 2012-05-10 NOTE — Telephone Encounter (Signed)
I spoke with the pt and made him aware that he cannot stop Effient until January 2014.  I also made the pt aware that Dr [name] signed Rx for pain medication and this has been placed at the front desk for pick-up.

## 2012-05-13 ENCOUNTER — Ambulatory Visit (HOSPITAL_COMMUNITY): Payer: Self-pay

## 2012-05-15 ENCOUNTER — Ambulatory Visit (HOSPITAL_COMMUNITY): Payer: Self-pay

## 2012-05-17 ENCOUNTER — Ambulatory Visit (HOSPITAL_COMMUNITY): Payer: Self-pay

## 2012-05-20 ENCOUNTER — Ambulatory Visit (HOSPITAL_COMMUNITY): Payer: Self-pay

## 2012-05-21 ENCOUNTER — Ambulatory Visit (INDEPENDENT_AMBULATORY_CARE_PROVIDER_SITE_OTHER): Payer: Medicaid Other | Admitting: Cardiovascular Disease

## 2012-05-21 ENCOUNTER — Encounter: Payer: Self-pay | Admitting: Cardiovascular Disease

## 2012-05-21 VITALS — BP 117/75 | HR 80 | Ht 68.0 in | Wt 212.0 lb

## 2012-05-21 DIAGNOSIS — F17200 Nicotine dependence, unspecified, uncomplicated: Secondary | ICD-10-CM

## 2012-05-21 DIAGNOSIS — Z720 Tobacco use: Secondary | ICD-10-CM

## 2012-05-21 DIAGNOSIS — I739 Peripheral vascular disease, unspecified: Secondary | ICD-10-CM

## 2012-05-21 DIAGNOSIS — I2510 Atherosclerotic heart disease of native coronary artery without angina pectoris: Secondary | ICD-10-CM

## 2012-05-21 MED ORDER — OXYCODONE-ACETAMINOPHEN 5-325 MG PO TABS: ORAL_TABLET | ORAL | Status: DC

## 2012-05-21 NOTE — Progress Notes (Signed)
History of Present Illness: 46 yo WM with history of ongoing tobacco abuse, anterior STEMI January 2013 s/p DES LAD x 1, HLD, PAD here today for PV and cardiac f/u. He had presented to the [facility] ED on 12/12/11 with c/o chest pain. He was found to have anterior ST segment elevation and had cardiac arrest in the [facility] ED. He was transported emergently to [facility] where I met him in the cath lab. Cath showed subtotal occlusion of the mid LAD. This was treated with a PTCA and placement of a 2.75 x 32 mm Promus Element DES and post-dilated with a 3.0 mm balloon. The third OM branch was chronically occluded and the RCA had mild disease. His post MI LVEF was 35%. He did well post MI and was discharged home with a LifeVest.  I saw him 01/24/12 for follow up and he had c/o pain in both legs with walking. Non-invasive imaging with ABI of 0.53 on the right and 0.45 on the left with suggestion of aortoiliac inflow disease. There also appeared to be left subclavian artery stenosis on non-invasive imaging. He had no rest pain. I arranged a distal aortogram with bilateral lower ext runoff and left subclavian angiography on 01/31/12. His distal aorta was occluded just distal to the renals. His left subclavian is occluded at the ostium. I reviewed his films with vascular surgery but he is not a good candidate for aorto-bifemoral bypass at this time since he is on Effient. Follow up echo 02/16/12 with normal LV function. His Lifevest was d/c'ed. He was seen by Durene Cal in [facility] clinic 02/26/12 and plans were made to wait for aorto-bifemoral bypass until after January 2014.   He has been doing well. He has had no chest pain or SOB. He has cut back to 5 cigarettes per day. His legs are hurting with minimal walking. He can walk twenty feet both legs and calf muscles ache. NO rest pain or ulcerations. His left arm aches with activity. He stopped Pletal.    Primary Care Physician: None  Past Medical History  Diagnosis Date    . Coronary atherosclerosis of native coronary artery     DES LAD 1/13, LVEF 35%  . MI (myocardial infarction)     AMI 1/13 - complicated by VT/Tosades  . Chronic back pain   . Anal fissure   . Rectal polyp   . Mixed hyperlipidemia   . PAD (peripheral artery disease)     Past Surgical History  Procedure Date  . Appendectomy   . Diagnostic laparoscopy   . Anal fissurectomy     Current Outpatient Prescriptions  Medication Sig Dispense Refill  . aspirin 81 MG tablet Take 160 mg by mouth daily.       . carvedilol (COREG) 6.25 MG tablet Take 6.25 mg by mouth 2 (two) times daily with a meal.      . enalapril (VASOTEC) 5 MG tablet Take 5 mg by mouth 2 (two) times daily.      Marland Kitchen ibuprofen (ADVIL,MOTRIN) 200 MG tablet Take 200 mg by mouth every 6 (six) hours as needed. For pain      . nitroGLYCERIN (NITROSTAT) 0.4 MG SL tablet Place 0.4 mg under the tongue every 5 (five) minutes as needed. For chest pain      . oxyCODONE-acetaminophen (ROXICET) 5-325 MG per tablet Take 1-2 tablets by mouth every 6 hours as needed for pain  60 tablet  0  . prasugrel (EFFIENT) 10 MG TABS Take  10 mg by mouth daily.      . simvastatin (ZOCOR) 40 MG tablet Take 40 mg by mouth every evening.      Marland Kitchen spironolactone (ALDACTONE) 25 MG tablet Take 6.25 mg by mouth daily.      . cilostazol (PLETAL) 100 MG tablet Take 1 tablet (100 mg total) by mouth 2 (two) times daily.  60 tablet  6    No Known Allergies  History   Social History  . Marital Status: Divorced    Spouse Name: N/A    Number of Children: N/A  . Years of Education: N/A   Occupational History  .  Other   Social History Main Topics  . Smoking status: Current Everyday Smoker -- 0.2 packs/day for 25 years    Types: Cigarettes  . Smokeless tobacco: Never Used  . Alcohol Use: 0.0 oz/week     Weekends  . Drug Use: No  . Sexually Active: Not on file   Other Topics Concern  . Not on file   Social History Narrative   [employer] -  physical labor  - works night shift    Family History  Problem Relation Age of Onset  . Cancer Father     Lung  . Cancer Maternal Uncle     Colon  . Diabetes Mother   . COPD Mother     Review of Systems:  As stated in the HPI and otherwise negative.   BP 117/75  Pulse 80  Ht 5\' 8"  (1.727 m)  Wt 212 lb (96.163 kg)  BMI 32.23 kg/m2  Physical Examination: General: Well developed, well nourished, NAD HEENT: OP clear, mucus membranes moist SKIN: warm, dry. No rashes. Neuro: No focal deficits Musculoskeletal: Muscle strength 5/5 all ext Psychiatric: Mood and affect normal Neck: No JVD, no carotid bruits, no thyromegaly, no lymphadenopathy. Lungs:Clear bilaterally, no wheezes, rhonci, crackles Cardiovascular: Regular rate and rhythm. No murmurs, gallops or rubs. Abdomen:Soft. Bowel sounds present. Non-tender.  Extremities: No lower extremity edema. Pulses are non-palpable  in the bilateral DP/PT.

## 2012-05-21 NOTE — Patient Instructions (Signed)
Your physician wants you to follow-up in: 6 months.  You will receive a reminder letter in the mail two months in advance. If you don't receive a letter, please call our office to schedule the follow-up appointment.  Your physician has recommended you make the following change in your medication: Stop Pletal

## 2012-05-21 NOTE — Assessment & Plan Note (Signed)
Stable. He still has severe pain in legs with walking and pain in left arm but no rest pain or ulcerations. Will d/c Pletal. Will have f/u with Dr. Myra Gianotti in 3 months. Plans for lower ext revascularization and left arm revascularization after January 2014. Will refill narcotics today for pain control.

## 2012-05-21 NOTE — Assessment & Plan Note (Signed)
Complete cessation encouraged. 

## 2012-05-21 NOTE — Assessment & Plan Note (Signed)
Stable. Continue current therapy. He will need dual antiplatelet therapy with ASA and Effient until January 2014. He is given samples of Effient today.

## 2012-05-22 ENCOUNTER — Ambulatory Visit (HOSPITAL_COMMUNITY): Payer: Self-pay

## 2012-05-24 ENCOUNTER — Ambulatory Visit (HOSPITAL_COMMUNITY): Payer: Self-pay

## 2012-05-27 ENCOUNTER — Ambulatory Visit (HOSPITAL_COMMUNITY): Payer: Self-pay

## 2012-05-29 ENCOUNTER — Ambulatory Visit (HOSPITAL_COMMUNITY): Payer: Self-pay

## 2012-05-31 ENCOUNTER — Ambulatory Visit (HOSPITAL_COMMUNITY): Payer: Self-pay

## 2012-06-03 ENCOUNTER — Telehealth: Payer: Self-pay | Admitting: Cardiovascular Disease

## 2012-06-03 ENCOUNTER — Ambulatory Visit (HOSPITAL_COMMUNITY): Payer: Self-pay

## 2012-06-03 DIAGNOSIS — I739 Peripheral vascular disease, unspecified: Secondary | ICD-10-CM

## 2012-06-03 MED ORDER — PRASUGREL HCL 10 MG PO TABS: 10.0000 mg | ORAL_TABLET | Freq: Every day | ORAL | Status: DC

## 2012-06-03 NOTE — Telephone Encounter (Signed)
New Problem:    Patient called in needing refills of his prasugrel (EFFIENT) 10 MG TABS and oxyCODONE-acetaminophen (ROXICET) 5-325 MG per tablet to pick up form our office.  Please call once the orders have been placed.

## 2012-06-03 NOTE — Telephone Encounter (Signed)
Spoke with pt. He would like printed prescription for Effient.  He also would like printed prescription for oxycodone. He states he usually takes 2 tablets by mouth three times daily.  He is going out of town soon and would like new prescription prior to leaving. I told pt Dr. [name] would be back in office on June 05, 2012 and I would review with him and he would sign prescriptions at that time if indicated.

## 2012-06-03 NOTE — Telephone Encounter (Signed)
Pat, I am ok with this if you would print out and I will sign Wednesday. Thanks, chris

## 2012-06-05 ENCOUNTER — Ambulatory Visit (HOSPITAL_COMMUNITY): Payer: Self-pay

## 2012-06-05 MED ORDER — OXYCODONE-ACETAMINOPHEN 5-325 MG PO TABS: ORAL_TABLET | ORAL | Status: DC

## 2012-06-05 NOTE — Telephone Encounter (Signed)
Spoke with pt and told him prescriptions would be at front desk later this morning for him to pick up

## 2012-06-05 NOTE — Telephone Encounter (Signed)
Fu call Pt calling back about meds

## 2012-06-07 ENCOUNTER — Ambulatory Visit (HOSPITAL_COMMUNITY): Payer: Self-pay

## 2012-06-14 ENCOUNTER — Other Ambulatory Visit: Payer: Self-pay | Admitting: Cardiovascular Disease

## 2012-06-14 DIAGNOSIS — I739 Peripheral vascular disease, unspecified: Secondary | ICD-10-CM

## 2012-06-14 MED ORDER — CARVEDILOL 6.25 MG PO TABS: 6.2500 mg | ORAL_TABLET | Freq: Two times a day (BID) | ORAL | Status: DC

## 2012-06-14 MED ORDER — ENALAPRIL MALEATE 5 MG PO TABS: 5.0000 mg | ORAL_TABLET | Freq: Two times a day (BID) | ORAL | Status: DC

## 2012-06-14 MED ORDER — OXYCODONE-ACETAMINOPHEN 5-325 MG PO TABS: ORAL_TABLET | ORAL | Status: DC

## 2012-06-14 NOTE — Telephone Encounter (Signed)
Spoke with pt and told him prescription was refilled on June 05, 2012 and that it is too soon to refill at this time.  Pt would like to know when it can be refilled.  He states he takes 6 tablets daily.  I told him I would check with Dr. [name]

## 2012-06-14 NOTE — Telephone Encounter (Signed)
Patient would like hardcopy of his prescription for OXYCODONE left at the front desk. Please return call to discuss this matter [phone].

## 2012-06-14 NOTE — Telephone Encounter (Signed)
Spoke with Dr. [name] and prescription can be refilled today for 120 tablets. Prescription written and left at front desk for pt to pick up. Message left on number given below and on home phone for pt to call back

## 2012-06-18 NOTE — Telephone Encounter (Signed)
Pt aware.

## 2012-07-08 ENCOUNTER — Other Ambulatory Visit: Payer: Self-pay | Admitting: Cardiovascular Disease

## 2012-07-08 DIAGNOSIS — I739 Peripheral vascular disease, unspecified: Secondary | ICD-10-CM

## 2012-07-08 MED ORDER — SPIRONOLACTONE 25 MG PO TABS: 6.2500 mg | ORAL_TABLET | Freq: Every day | ORAL | Status: DC

## 2012-07-08 MED ORDER — OXYCODONE-ACETAMINOPHEN 5-325 MG PO TABS: ORAL_TABLET | ORAL | Status: DC

## 2012-07-08 MED ORDER — CARVEDILOL 6.25 MG PO TABS: 6.2500 mg | ORAL_TABLET | Freq: Two times a day (BID) | ORAL | Status: DC

## 2012-07-08 MED ORDER — ENALAPRIL MALEATE 5 MG PO TABS: 5.0000 mg | ORAL_TABLET | Freq: Two times a day (BID) | ORAL | Status: DC

## 2012-07-08 NOTE — Telephone Encounter (Signed)
Pt needs samples of effient and a new Rx for pain medicine

## 2012-07-09 ENCOUNTER — Telehealth: Payer: Self-pay | Admitting: Cardiovascular Disease

## 2012-07-09 MED ORDER — SIMVASTATIN 40 MG PO TABS: 40.0000 mg | ORAL_TABLET | Freq: Every evening | ORAL | Status: DC

## 2012-07-09 NOTE — Telephone Encounter (Signed)
Fax Received. Refill Completed. Mar Zettler Chowoe (R.M.A)   

## 2012-07-09 NOTE — Telephone Encounter (Signed)
New msg Pt wants samples of effient and rx for oxycodone. He said he called yesterday. Please let him know

## 2012-07-09 NOTE — Telephone Encounter (Signed)
Prescription for oxycodone refill signed and left at front desk for pt to pick up. He is aware. Pt told we do not have Effient samples in office at this time but that he should contact us later this week to see if we have received any. He is also going to contact Health Department as he has received samples from them in the past. He states his pharmacy is going to send [place] information about getting approval for Medicaid to cover Effient. I told pt I would watch for this paperwork

## 2012-07-11 ENCOUNTER — Telehealth: Payer: Self-pay | Admitting: *Deleted

## 2012-07-11 DIAGNOSIS — I739 Peripheral vascular disease, unspecified: Secondary | ICD-10-CM

## 2012-07-11 NOTE — Telephone Encounter (Signed)
Spoke with [facility] and they will fax prior auth form to our office today

## 2012-07-11 NOTE — Telephone Encounter (Signed)
Pt calling asking about samples of Effient.  I spoke with pt and told him I would leave samples at front desk for him to pick up.  Effient 10 mg--28 tablets--exp-7/14, Lot [id number] left at front desk.  Pt states pharmacy he took prescription to for Effient is [facility] in [place].  I told pt that we have not received prior authorization request but that I would contact pharmacy to request.

## 2012-07-19 NOTE — Telephone Encounter (Signed)
Prior auth form completed and faxed.

## 2012-07-25 NOTE — Telephone Encounter (Signed)
Dennie Bible, We can refill this for him with 120 pills. chris

## 2012-07-25 NOTE — Telephone Encounter (Signed)
Fu call Pt wants ask questions about effient and pain med. Please call

## 2012-07-25 NOTE — Telephone Encounter (Signed)
Pt is requesting refill on his Roxicet.  States he takes it for leg pain.  Dr. [name] gave him 120 pills on 07/08/2012.  Pt states he takes about 6 pills per day.  Will forward to Dr. [name] and Charlotte Crumb for review.  Pt states if approved he would like to p/u a written RX on Friday 07/26/2012.

## 2012-07-26 MED ORDER — OXYCODONE-ACETAMINOPHEN 5-325 MG PO TABS: ORAL_TABLET | ORAL | Status: DC

## 2012-07-26 NOTE — Telephone Encounter (Signed)
Spoke with pt and told him prescription would be at front desk.  I have also spoken with Medicaid ([place]) regarding prior authorization for Effient.  Information was faxed on July 19, 2012 and entered into [place] system today. I was told I could call back in 48 hours for status of prior authorization.  Number to call is [phone]. I told pt I would call him back with prior authorization status when available.

## 2012-08-02 NOTE — Telephone Encounter (Signed)
I spoke with Medicaid and Effient has been approved. Pt's pharmacy notified. I called to give pt this information. Left message to call back

## 2012-08-06 ENCOUNTER — Encounter: Payer: Self-pay | Admitting: Cardiovascular Disease

## 2012-08-06 ENCOUNTER — Ambulatory Visit (INDEPENDENT_AMBULATORY_CARE_PROVIDER_SITE_OTHER): Payer: Medicaid Other | Admitting: Cardiovascular Disease

## 2012-08-06 VITALS — BP 130/76 | HR 80 | Wt 215.0 lb

## 2012-08-06 DIAGNOSIS — Z720 Tobacco use: Secondary | ICD-10-CM

## 2012-08-06 DIAGNOSIS — I25810 Atherosclerosis of coronary artery bypass graft(s) without angina pectoris: Secondary | ICD-10-CM

## 2012-08-06 DIAGNOSIS — F17200 Nicotine dependence, unspecified, uncomplicated: Secondary | ICD-10-CM

## 2012-08-06 DIAGNOSIS — I739 Peripheral vascular disease, unspecified: Secondary | ICD-10-CM

## 2012-08-06 DIAGNOSIS — R079 Chest pain, unspecified: Secondary | ICD-10-CM

## 2012-08-06 MED ORDER — PRASUGREL HCL 10 MG PO TABS: 10.0000 mg | ORAL_TABLET | Freq: Every day | ORAL | Status: DC

## 2012-08-06 MED ORDER — OXYCODONE-ACETAMINOPHEN 5-325 MG PO TABS: ORAL_TABLET | ORAL | Status: DC

## 2012-08-06 NOTE — Patient Instructions (Signed)
Your physician recommends that you schedule a follow-up appointment in: 4 months.   Your physician has requested that you have a lexiscan myoview. For further information please visit [email or web address] Please follow instruction sheet, as given.

## 2012-08-06 NOTE — Progress Notes (Signed)
History of Present Illness: 46 yo WM with history of ongoing tobacco abuse, anterior STEMI January 2013 s/p DES LAD x 1, HLD, PAD here today for PV and cardiac f/u. He had presented to the [facility] ED on 12/12/11 with c/o chest pain. He was found to have anterior ST segment elevation and had cardiac arrest in [facility]. He was transported emergently to [facility] where I met him in the cath lab. Cath showed subtotal occlusion of the mid LAD. This was treated with a PTCA and placement of a 2.75 x 32 mm Promus Element DES and post-dilated with a 3.0 mm balloon. The third OM branch was chronically occluded and the RCA had mild disease. His post MI LVEF was 35%. He did well post MI and was discharged home with a LifeVest. I saw him 01/24/12 for follow up and he had c/o pain in both legs with walking. Non-invasive imaging with ABI of 0.53 on the right and 0.45 on the left with suggestion of aortoiliac inflow disease. There also appeared to be left subclavian artery stenosis on non-invasive imaging. I arranged a distal aortogram with bilateral lower ext runoff and left subclavian angiography on 01/31/12. His distal aorta was occluded just distal to the renals. His left subclavian is occluded at the ostium. I reviewed his films with vascular surgery but he is not a good candidate for aorto-bifemoral bypass at this time since he is on Effient. Follow up echo 02/16/12 with normal LV function. His Lifevest was d/c'ed. He was seen by Dr. Durene Cal in [facility] clinic 02/26/12 and plans were made to wait for aorto-bifemoral bypass until after January 2014.   He has been doing well. He has had no chest pain. He has cut back to 6 cigarettes per day. His legs are hurting with minimal walking. He can walk twenty feet both legs and calf muscles ache. No changes. No rest pain or ulcerations. His left arm aches with activity. He does have some pain in his right arm with exertion, resolves with rest. Also exertional SOB.   Primary  Care Physician: None  Last Lipid Profile:  Lipid Panel     Component Value Date/Time   CHOL 237* 12/13/2011 0607   TRIG 329* 12/13/2011 0607   HDL 30* 12/13/2011 0607   CHOLHDL 7.9 12/13/2011 0607   VLDL 66* 12/13/2011 0607   LDLCALC 141* 12/13/2011 1610     Past Medical History  Diagnosis Date  . Coronary atherosclerosis of native coronary artery     DES LAD 1/13, LVEF 35%  . MI (myocardial infarction)     AMI 1/13 - complicated by VT/Tosades  . Chronic back pain   . Anal fissure   . Rectal polyp   . Mixed hyperlipidemia   . PAD (peripheral artery disease)     Past Surgical History  Procedure Date  . Appendectomy   . Diagnostic laparoscopy   . Anal fissurectomy     Current Outpatient Prescriptions  Medication Sig Dispense Refill  . aspirin 81 MG tablet Take 160 mg by mouth daily.       . carvedilol (COREG) 6.25 MG tablet Take 1 tablet (6.25 mg total) by mouth 2 (two) times daily with a meal.  60 tablet  6  . enalapril (VASOTEC) 5 MG tablet Take 1 tablet (5 mg total) by mouth 2 (two) times daily.  60 tablet  6  . ibuprofen (ADVIL,MOTRIN) 200 MG tablet Take 200 mg by mouth every 6 (six) hours  as needed. For pain      . nitroGLYCERIN (NITROSTAT) 0.4 MG SL tablet Place 0.4 mg under the tongue every 5 (five) minutes as needed. For chest pain      . oxyCODONE-acetaminophen (ROXICET) 5-325 MG per tablet Take 1-2 tablets by mouth every 6 hours as needed for pain  120 tablet  0  . prasugrel (EFFIENT) 10 MG TABS Take 1 tablet (10 mg total) by mouth daily.  30 tablet  6  . simvastatin (ZOCOR) 40 MG tablet Take 1 tablet (40 mg total) by mouth every evening.  30 tablet  5  . spironolactone (ALDACTONE) 25 MG tablet Take 0.5 tablets (12.5 mg total) by mouth daily.  30 tablet  6    No Known Allergies  History   Social History  . Marital Status: Divorced    Spouse Name: N/A    Number of Children: N/A  . Years of Education: N/A   Occupational History  .  Other   Social History Main  Topics  . Smoking status: Current Everyday Smoker -- 0.2 packs/day for 25 years    Types: Cigarettes  . Smokeless tobacco: Never Used  . Alcohol Use: 0.0 oz/week     Weekends  . Drug Use: No  . Sexually Active: Not on file   Other Topics Concern  . Not on file   Social History Narrative   [employer] - physical labor  - works night shift    Family History  Problem Relation Age of Onset  . Cancer Father     Lung  . Cancer Maternal Uncle     Colon  . Diabetes Mother   . COPD Mother     Review of Systems:  As stated in the HPI and otherwise negative.   BP 130/76  Pulse 80  Wt 215 lb (97.523 kg)  Physical Examination: General: Well developed, well nourished, NAD HEENT: OP clear, mucus membranes moist SKIN: warm, dry. No rashes. Neuro: No focal deficits Musculoskeletal: Muscle strength 5/5 all ext Psychiatric: Mood and affect normal Neck: No JVD, no carotid bruits, no thyromegaly, no lymphadenopathy. Lungs:Clear bilaterally, no wheezes, rhonci, crackles Cardiovascular: Regular rate and rhythm. No murmurs, gallops or rubs. Abdomen:Soft. Bowel sounds present. Non-tender.  Extremities: No lower extremity edema. Pulses are trace to 1 + in the bilateral DP/PT.  Assessment and Plan:   1. PAD: Stable. He still has severe pain in legs with walking and pain in left arm but no rest pain or ulcerations. Will d/c Pletal. Will have f/u with Dr. Myra Gianotti in 3 months. Plans for lower ext revascularization and left arm revascularization after January 2014. Will refill narcotics today for pain control.   2. Coronary atherosclerosis of native coronary artery: Recent exertional right arm pain and SOB. Will arrange Steffanie Dunn to exclude ischemia He will need dual antiplatelet therapy with ASA and Effient until January 2014. Continue statin and beta blocker.   3. Tobacco abuse: Complete cessation encouraged.   4. Chest pain: See above.

## 2012-08-06 NOTE — Telephone Encounter (Signed)
Pt.notified

## 2012-08-15 ENCOUNTER — Ambulatory Visit (HOSPITAL_COMMUNITY): Payer: Medicaid Other | Attending: Internal Medicine | Admitting: Radiology

## 2012-08-15 VITALS — BP 101/65 | Ht 69.0 in | Wt 215.0 lb

## 2012-08-15 DIAGNOSIS — I999 Unspecified disorder of circulatory system: Secondary | ICD-10-CM | POA: Insufficient documentation

## 2012-08-15 DIAGNOSIS — I739 Peripheral vascular disease, unspecified: Secondary | ICD-10-CM | POA: Insufficient documentation

## 2012-08-15 DIAGNOSIS — R0989 Other specified symptoms and signs involving the circulatory and respiratory systems: Secondary | ICD-10-CM | POA: Insufficient documentation

## 2012-08-15 DIAGNOSIS — R0609 Other forms of dyspnea: Secondary | ICD-10-CM | POA: Insufficient documentation

## 2012-08-15 DIAGNOSIS — R079 Chest pain, unspecified: Secondary | ICD-10-CM | POA: Insufficient documentation

## 2012-08-15 DIAGNOSIS — I2510 Atherosclerotic heart disease of native coronary artery without angina pectoris: Secondary | ICD-10-CM

## 2012-08-15 DIAGNOSIS — F17200 Nicotine dependence, unspecified, uncomplicated: Secondary | ICD-10-CM | POA: Insufficient documentation

## 2012-08-15 DIAGNOSIS — I25810 Atherosclerosis of coronary artery bypass graft(s) without angina pectoris: Secondary | ICD-10-CM

## 2012-08-15 MED ORDER — TECHNETIUM TC 99M SESTAMIBI GENERIC - CARDIOLITE
30.0000 | Freq: Once | INTRAVENOUS | Status: AC | PRN
  Administered 2012-08-15: 30 via INTRAVENOUS

## 2012-08-15 MED ORDER — REGADENOSON 0.4 MG/5ML IV SOLN
0.4000 mg | Freq: Once | INTRAVENOUS | Status: AC
  Administered 2012-08-15: 0.4 mg via INTRAVENOUS

## 2012-08-15 MED ORDER — TECHNETIUM TC 99M SESTAMIBI GENERIC - CARDIOLITE
10.0000 | Freq: Once | INTRAVENOUS | Status: AC | PRN
  Administered 2012-08-15: 10 via INTRAVENOUS

## 2012-08-15 NOTE — Progress Notes (Signed)
[facility] SITE 3 NUCLEAR MED [address] [place] [address] [phone]  Cardiology Nuclear Med Study  Ryan [name] is a 46 y.o. male     MRN : [id number]     DOB: 09/23/1966  Procedure Date: 08/15/2012  Nuclear Med Background Indication for Stress Test:  Evaluation for Ischemia and Stent Patency History:  12/2011: MI-AWSTEMI with Cardiac Arrest, 12/2011 Heart Cath: LAD subtotal occlusion 3rd OM chronically occludded RCA  mild Dz EF: 35% 12/2011 Stents: LADx1 02/2012 ECHO: EF: 55%  Cardiac Risk Factors: Lipids, PVD and Smoker  Symptoms:  Chest Pain and DOE   Nuclear Pre-Procedure Caffeine/Decaff Intake:  None in 12 hours NPO After: 8:00pm   Lungs:  clear O2 Sat: 95% on room air. IV 0.9% NS with Angio Cath:  20g  IV Site: R Hand  IV Started by:  Doyne Keel, CNMT  Chest Size (in):  46in Cup Size: n/a  Height: 5\' 9"  (1.753 m)  Weight:  215 lb (97.523 kg)  BMI:  Body mass index is 31.75 kg/(m^2). Tech Comments:  Patient took all meds as directed    Nuclear Med Study 1 or 2 day study: 1 day  Stress Test Type:  Eugenie Birks  Reading MD: Dietrich Pates, MD  Order Authorizing Provider:  C. McAlhany, MD  Resting Radionuclide: Technetium 78m Sestamibi  Resting Radionuclide Dose: 11.0 mCi   Stress Radionuclide:  Technetium 110m Sestamibi  Stress Radionuclide Dose: 33.0 mCi           Stress Protocol Rest HR: 101 Stress HR: 101  Rest BP: 101/65 Stress BP: 108/60  Exercise Time (min): n/a METS: n/a   Predicted Max HR: 175 bpm % Max HR: 57.71 bpm Rate Pressure Product: 86578   Dose of Adenosine (mg):  n/a Dose of Lexiscan: 0.4 mg  Dose of Atropine (mg): n/a Dose of Dobutamine: n/a mcg/kg/min (at max HR)  Stress Test Technologist: Milana Na, EMT-P  Nuclear Technologist:  Domenic Polite, CNMT     Rest Procedure:  Myocardial perfusion imaging was performed at rest 45 minutes following the intravenous administration of Technetium 70m Sestamibi. Rest ECG: NSR  - Normal EKG  Stress Procedure:  The patient received IV Lexiscan 0.4 mg over 15-seconds.  Technetium 75m Tetrofosmin injected at 30-seconds.  There were no significant changes, sob, and nausea with Lexiscan.  Quantitative spect images were obtained after a 45 minute delay. Stress ECG: No significant change from baseline ECG  QPS Raw Data Images:  Images were motion corrected.  Soft tissue (diaphragm, subcutaneous fat) surround heart. Stress Images:  Small defect at apex  Otherwise normal perfusion. Rest Images:  Normal homogeneous uptake in all areas of the myocardium. Subtraction (SDS):  Small region of ischemia. Transient Ischemic Dilatation (Normal <1.22):  1.10 Lung/Heart Ratio (Normal <0.45):  0.42  Quantitative Gated Spect Images QGS EDV:  113 ml QGS ESV:  56 ml  Impression Exercise Capacity:  Lexiscan with no exercise. BP Response:  Normal blood pressure response. Clinical Symptoms:  No significant symptoms noted. ECG Impression:  No significant ST segment change suggestive of ischemia. Comparison with Prior Nuclear Study: no prior study  Overall Impression:  Small region of apical ischemia.  Otherwise normal perfusion.  Overall low risk scan  LV Ejection Fraction: 50%.  LV Wall Motion:  Apical hypokinesis   Dietrich Pates

## 2012-08-22 ENCOUNTER — Other Ambulatory Visit: Payer: Self-pay | Admitting: Cardiovascular Disease

## 2012-08-22 DIAGNOSIS — I739 Peripheral vascular disease, unspecified: Secondary | ICD-10-CM

## 2012-08-23 ENCOUNTER — Encounter: Payer: Self-pay | Admitting: Surgery

## 2012-08-23 NOTE — Telephone Encounter (Signed)
Spoke with pt and told him I would check with Dr.McAlhany regarding refill. Pt made aware Dr. [name] would be back in office next Wednesday and I would call him at that time

## 2012-08-26 ENCOUNTER — Encounter: Payer: Self-pay | Admitting: Surgery

## 2012-08-26 ENCOUNTER — Ambulatory Visit (INDEPENDENT_AMBULATORY_CARE_PROVIDER_SITE_OTHER): Payer: Medicaid Other | Admitting: Surgery

## 2012-08-26 VITALS — BP 84/43 | HR 77 | Resp 16 | Ht 69.0 in | Wt 217.5 lb

## 2012-08-26 DIAGNOSIS — I6529 Occlusion and stenosis of unspecified carotid artery: Secondary | ICD-10-CM | POA: Insufficient documentation

## 2012-08-26 DIAGNOSIS — I779 Disorder of arteries and arterioles, unspecified: Secondary | ICD-10-CM | POA: Insufficient documentation

## 2012-08-26 DIAGNOSIS — I7092 Chronic total occlusion of artery of the extremities: Secondary | ICD-10-CM

## 2012-08-26 DIAGNOSIS — Z01818 Encounter for other preprocedural examination: Secondary | ICD-10-CM

## 2012-08-26 HISTORY — DX: Occlusion and stenosis of unspecified carotid artery: I65.29

## 2012-08-26 NOTE — Addendum Note (Signed)
Addended by: Melodye Ped C on: 08/26/2012 10:44 AM   Modules accepted: Orders

## 2012-08-26 NOTE — Progress Notes (Signed)
[facility] [place]   Patient name: Ryan [name] MRN: [id number] DOB: March 14, 1966 Sex: male     Chief Complaint  Patient presents with  . Carotid    6 month f/u no labs  . PVD    6 month f/u    HISTORY OF PRESENT ILLNESS:  the patient is back today for followup. I initially met him in March for evaluation of peripheral vascular disease. In January of 2013. At that time he had a drug-eluting stent placed to his LAD on the right is 0.53 and 0.45 on the left. Abdominal aortogram revealed aortic occlusion at the level of the renal arteries. He also has a left subclavian artery occlusion. He states that he can walk approximately 30 feet before his legs started to bother him and he has to stop. His symptoms continue to be alleviated with rest. He denies rest pain. He denies nonhealing wounds.  The patient continues to be medically managed for his hyperlipidemia. He continues to be a smoker.  Past Medical History  Diagnosis Date  . Coronary atherosclerosis of native coronary artery     DES LAD 1/13, LVEF 35%  . MI (myocardial infarction)     AMI 1/13 - complicated by VT/Tosades  . Chronic back pain   . Anal fissure   . Rectal polyp   . Mixed hyperlipidemia   . PAD (peripheral artery disease)     Past Surgical History  Procedure Date  . Appendectomy   . Diagnostic laparoscopy   . Anal fissurectomy     History   Social History  . Marital Status: Divorced    Spouse Name: N/A    Number of Children: N/A  . Years of Education: N/A   Occupational History  .  Other   Social History Main Topics  . Smoking status: Current Every Day Smoker -- 0.5 packs/day for 25 years    Types: Cigarettes  . Smokeless tobacco: Never Used   Comment: pt states that he has cut back  . Alcohol Use: 0.0 oz/week     Weekends  . Drug Use: No  . Sexually Active: Not on file   Other Topics Concern  . Not on file   Social History Narrative   [employer] -  physical labor  - works night shift    Family History  Problem Relation Age of Onset  . Cancer Father     Lung  . Cancer Maternal Uncle     Colon  . Diabetes Mother   . COPD Mother     Allergies as of 08/26/2012  . (No Known Allergies)    Current Outpatient Prescriptions on File Prior to Visit  Medication Sig Dispense Refill  . aspirin 81 MG tablet Take 160 mg by mouth daily.       . carvedilol (COREG) 6.25 MG tablet Take 1 tablet (6.25 mg total) by mouth 2 (two) times daily with a meal.  60 tablet  6  . enalapril (VASOTEC) 5 MG tablet Take 1 tablet (5 mg total) by mouth 2 (two) times daily.  60 tablet  6  . nitroGLYCERIN (NITROSTAT) 0.4 MG SL tablet Place 0.4 mg under the tongue every 5 (five) minutes as needed. For chest pain      . oxyCODONE-acetaminophen (ROXICET) 5-325 MG per tablet Take 1-2 tablets by mouth every 6 hours as needed for pain  120 tablet  0  . prasugrel (EFFIENT) 10 MG TABS Take 1 tablet (10 mg  total) by mouth daily.  90 tablet  2  . simvastatin (ZOCOR) 40 MG tablet Take 1 tablet (40 mg total) by mouth every evening.  30 tablet  5  . spironolactone (ALDACTONE) 25 MG tablet Take 0.5 tablets (12.5 mg total) by mouth daily.  30 tablet  6     REVIEW OF SYSTEMS: No changes from prior visit  PHYSICAL EXAMINATION:   Vital signs are BP 84/43  Pulse 77  Resp 16  Ht 5\' 9"  (1.753 m)  Wt 217 lb 8 oz (98.657 kg)  BMI 32.12 kg/m2  SpO2 100% General: The patient appears their stated age. HEENT:  No gross abnormalities Pulmonary:  Non labored breathing Musculoskeletal: There are no major deformities. Neurologic: No focal weakness or paresthesias are detected, Skin: There are no ulcer or rashes noted. Psychiatric: The patient has normal affect. Cardiovascular: There is a regular rate and rhythm without significant murmur appreciated.   Diagnostic Studies Carotid ultrasound was ordered and reviewed today this shows 1-39% stenosis  bilaterally.  Assessment: Aortic occlusion Plan: The patient's symptoms have been stable for the past 6 months. We are awaiting the ability to discontinue his Plavix for 5 days in order to proceed with aortobifemoral bypass graft. Hopefully, this will be in January which will be the one-year anniversary of his stent placement. He is going to see me in January with a CT scan prior.  Jorge Ny, M.D. [facility] [place] Office: [phone] Pager:  [phone]

## 2012-08-28 MED ORDER — OXYCODONE-ACETAMINOPHEN 5-325 MG PO TABS: ORAL_TABLET | ORAL | Status: DC

## 2012-08-28 NOTE — Telephone Encounter (Signed)
F/U  Patient will be by the office today to pick up his written rx.

## 2012-08-28 NOTE — Telephone Encounter (Signed)
Spoke with pt and let him know prescription written today is for one month supply and that it will be at front desk for him to pick up.

## 2012-08-29 ENCOUNTER — Emergency Department (HOSPITAL_COMMUNITY): Payer: No Typology Code available for payment source

## 2012-08-29 ENCOUNTER — Encounter (HOSPITAL_COMMUNITY): Payer: Self-pay | Admitting: Emergency Medicine

## 2012-08-29 ENCOUNTER — Telehealth: Payer: Self-pay | Admitting: Cardiovascular Disease

## 2012-08-29 ENCOUNTER — Emergency Department (HOSPITAL_COMMUNITY)
Admission: EM | Admit: 2012-08-29 | Discharge: 2012-08-29 | Disposition: A | Discharge status: Home / Self Care | Payer: No Typology Code available for payment source | Attending: Emergency Medicine | Admitting: Emergency Medicine

## 2012-08-29 DIAGNOSIS — G8929 Other chronic pain: Secondary | ICD-10-CM | POA: Insufficient documentation

## 2012-08-29 DIAGNOSIS — E782 Mixed hyperlipidemia: Secondary | ICD-10-CM

## 2012-08-29 DIAGNOSIS — Y92410 Unspecified street and highway as the place of occurrence of the external cause: Secondary | ICD-10-CM | POA: Insufficient documentation

## 2012-08-29 DIAGNOSIS — I2510 Atherosclerotic heart disease of native coronary artery without angina pectoris: Secondary | ICD-10-CM

## 2012-08-29 DIAGNOSIS — Z9089 Acquired absence of other organs: Secondary | ICD-10-CM | POA: Insufficient documentation

## 2012-08-29 DIAGNOSIS — M79609 Pain in unspecified limb: Secondary | ICD-10-CM

## 2012-08-29 DIAGNOSIS — Z7982 Long term (current) use of aspirin: Secondary | ICD-10-CM | POA: Insufficient documentation

## 2012-08-29 DIAGNOSIS — I252 Old myocardial infarction: Secondary | ICD-10-CM | POA: Insufficient documentation

## 2012-08-29 DIAGNOSIS — I739 Peripheral vascular disease, unspecified: Secondary | ICD-10-CM | POA: Insufficient documentation

## 2012-08-29 DIAGNOSIS — M542 Cervicalgia: Secondary | ICD-10-CM | POA: Insufficient documentation

## 2012-08-29 LAB — CBC WITH DIFFERENTIAL/PLATELET
Basophils Relative: 1 % (ref 0–1)
Eosinophils Absolute: 0.3 10*3/uL (ref 0.0–0.7)
Eosinophils Relative: 2 % (ref 0–5)
Hemoglobin: 15.3 g/dL (ref 13.0–17.0)
Lymphocytes Relative: 27 % (ref 12–46)
Neutrophils Relative %: 64 % (ref 43–77)
Platelets: 214 10*3/uL (ref 150–400)
RBC: 4.55 MIL/uL (ref 4.22–5.81)

## 2012-08-29 LAB — COMPREHENSIVE METABOLIC PANEL
ALT: 74 U/L — ABNORMAL HIGH (ref 0–53)
AST: 39 U/L — ABNORMAL HIGH (ref 0–37)
Alkaline Phosphatase: 81 U/L (ref 39–117)
CO2: 22 mEq/L (ref 19–32)
Calcium: 9.6 mg/dL (ref 8.4–10.5)
GFR calc Af Amer: >90 mL/min (ref >90)
Glucose, Bld: 223 mg/dL — ABNORMAL HIGH (ref 70–99)
Potassium: 3.8 mEq/L (ref 3.5–5.1)
Sodium: 135 mEq/L (ref 135–145)
Total Protein: 7.3 g/dL (ref 6.0–8.3)

## 2012-08-29 MED ORDER — OXYCODONE-ACETAMINOPHEN 5-325 MG PO TABS
2.0000 | ORAL_TABLET | Freq: Once | ORAL | Status: AC
  Administered 2012-08-29: 2 via ORAL
  Filled 2012-08-29: qty 2

## 2012-08-29 MED ORDER — ASPIRIN 81 MG PO CHEW: 324.0000 mg | CHEWABLE_TABLET | Freq: Once | ORAL | Status: DC

## 2012-08-29 MED ORDER — CYCLOBENZAPRINE HCL 10 MG PO TABS: 10.0000 mg | ORAL_TABLET | Freq: Two times a day (BID) | ORAL | Status: DC | PRN

## 2012-08-29 NOTE — ED Notes (Signed)
Prescriptions given with discharge instructions.  

## 2012-08-29 NOTE — ED Provider Notes (Signed)
Medical screening examination/treatment/procedure(s) were conducted as a shared visit with non-physician practitioner(s) and myself.  I personally evaluated the patient during the encounter   Glynn Octave, MD 08/29/12 2003
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
## 2012-08-29 NOTE — Telephone Encounter (Signed)
Left messages on cell number listed above as call back number and home number for pt to call back.
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
## 2012-09-19 ENCOUNTER — Other Ambulatory Visit: Payer: Self-pay | Admitting: Cardiovascular Disease

## 2012-09-19 DIAGNOSIS — I739 Peripheral vascular disease, unspecified: Secondary | ICD-10-CM

## 2012-09-19 NOTE — Telephone Encounter (Signed)
plz return call to pt [phone] regarding oxycodone RX

## 2012-09-20 MED ORDER — OXYCODONE-ACETAMINOPHEN 5-325 MG PO TABS: ORAL_TABLET | ORAL | Status: DC

## 2012-09-20 NOTE — Telephone Encounter (Signed)
Reviewed with Dr. [name] and OK to refill --one month supply. Prescription left at front desk
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
## 2012-09-20 NOTE — Telephone Encounter (Signed)
F/u   Pt calling for status on oxy RX refill, he can be reached at hM#

## 2012-09-20 NOTE — Telephone Encounter (Signed)
Left message to call back  

## 2012-09-20 NOTE — Telephone Encounter (Signed)
PT RTN Ryan [name] PLS CALL [phone]

## 2012-10-15 ENCOUNTER — Telehealth: Payer: Self-pay | Admitting: Cardiovascular Disease

## 2012-10-15 DIAGNOSIS — I739 Peripheral vascular disease, unspecified: Secondary | ICD-10-CM

## 2012-10-15 MED ORDER — OXYCODONE-ACETAMINOPHEN 5-325 MG PO TABS: ORAL_TABLET | ORAL | Status: DC

## 2012-10-15 NOTE — Telephone Encounter (Signed)
Last refilled on October 18,2013--180 tablets.  Will check with Dr. [name] to see if OK to refill same amount at this time.

## 2012-10-15 NOTE — Telephone Encounter (Signed)
New Problem:    Patient called in needing a prescription for his oxyCODONE-acetaminophen (ROXICET) 5-325 MG per tablet written out so he can pick it up from our office.  Please call back once the prescription is ready.

## 2012-10-15 NOTE — Telephone Encounter (Signed)
Ok by me. cdm

## 2012-10-15 NOTE — Telephone Encounter (Signed)
Message left for pt that prescription will be at front desk to be picked up after noon today.

## 2012-11-05 ENCOUNTER — Telehealth: Payer: Self-pay | Admitting: Cardiovascular Disease

## 2012-11-05 DIAGNOSIS — I739 Peripheral vascular disease, unspecified: Secondary | ICD-10-CM

## 2012-11-05 NOTE — Telephone Encounter (Signed)
Sheila Oats, We can write and prescription and I can sign on Thursday when I am in the office. Dennie Bible, can you help with this? Thanks, chris

## 2012-11-05 NOTE — Telephone Encounter (Signed)
Patient is aware that we can write a prescription for Oxycodone, and  MD will sign the prescription  on Thursday. Pt  States will come Thursday morning to peak it up.

## 2012-11-05 NOTE — Telephone Encounter (Signed)
plz return call to pt at hm# [phone] regarding Oxycodone RX.  Pt will be leaving to go out of state.

## 2012-11-05 NOTE — Telephone Encounter (Signed)
Pt called to have Oxycodone prescription written. Pt  is going out of town to [place] this coming Friday; and he needs the prescription before leaving. Pt had a prescription for this medication #180 tablets on 10/15/12.

## 2012-11-07 MED ORDER — OXYCODONE-ACETAMINOPHEN 5-325 MG PO TABS: ORAL_TABLET | ORAL | Status: DC

## 2012-11-07 NOTE — Telephone Encounter (Signed)
Prescription written and left at front desk for pt to pick up.

## 2012-12-05 ENCOUNTER — Ambulatory Visit (INDEPENDENT_AMBULATORY_CARE_PROVIDER_SITE_OTHER): Payer: Medicaid Other | Admitting: Cardiovascular Disease

## 2012-12-05 ENCOUNTER — Encounter: Payer: Self-pay | Admitting: Cardiovascular Disease

## 2012-12-05 VITALS — BP 123/76 | HR 74 | Ht 69.0 in | Wt 213.0 lb

## 2012-12-05 DIAGNOSIS — I2510 Atherosclerotic heart disease of native coronary artery without angina pectoris: Secondary | ICD-10-CM

## 2012-12-05 DIAGNOSIS — I739 Peripheral vascular disease, unspecified: Secondary | ICD-10-CM

## 2012-12-05 DIAGNOSIS — Z720 Tobacco use: Secondary | ICD-10-CM

## 2012-12-05 DIAGNOSIS — F17200 Nicotine dependence, unspecified, uncomplicated: Secondary | ICD-10-CM

## 2012-12-05 MED ORDER — OXYCODONE-ACETAMINOPHEN 5-325 MG PO TABS: ORAL_TABLET | ORAL | Status: DC

## 2012-12-05 NOTE — Progress Notes (Signed)
History of Present Illness: 47 yo WM with history of ongoing tobacco abuse, anterior STEMI January 2013 s/p DES LAD x 1, HLD, PAD here today for PV and cardiac f/u. He had presented to the [facility] ED on 12/12/11 with c/o chest pain. He was found to have anterior ST segment elevation and had cardiac arrest in the [facility] ED. He was transported emergently to [facility] where I met him in the cath lab. Cath showed subtotal occlusion of the mid LAD. This was treated with a PTCA and placement of a 2.75 x 32 mm Promus Element DES and post-dilated with a 3.0 mm balloon. The third OM branch was chronically occluded and the RCA had mild disease. His post MI LVEF was 35%. He did well post MI and was discharged home with a LifeVest. I saw him 01/24/12 for follow up and he had c/o pain in both legs with walking. Non-invasive imaging with ABI of 0.53 on the right and 0.45 on the left with suggestion of aortoiliac inflow disease. There also appeared to be left subclavian artery stenosis on non-invasive imaging. I arranged a distal aortogram with bilateral lower ext runoff and left subclavian angiography on 01/31/12. His distal aorta was occluded just distal to the renals. His left subclavian is occluded at the ostium. I reviewed his films with vascular surgery but he was not a good candidate for aorto-bifemoral bypass at that time since he is on Effient. Follow up echo 02/16/12 with normal LV function. His Lifevest was d/c'ed. He was seen by Dr. Durene Cal in [facility] clinic 02/26/12 and most recently September 2013 and plans were made to wait for aorto-bifemoral bypass until after January 2014. At his last visit here in September 2013, he c/o SOB and left arm pain. Lexiscan myoview with normal LV function, low risk study without any large areas of ischemia.   He has been doing well. He has had no chest pain. No change in breathing. He is smoking 6 cigarettes per day. His legs are hurting with minimal walking. He can walk twenty  feet both legs and calf muscles ache. No changes. No rest pain or ulcerations. His left arm aches with activity.   Primary Care Physician: None  Last Lipid Profile:Lipid Panel     Component Value Date/Time   CHOL 237* 12/13/2011 0607   TRIG 329* 12/13/2011 0607   HDL 30* 12/13/2011 0607   CHOLHDL 7.9 12/13/2011 0607   VLDL 66* 12/13/2011 0607   LDLCALC 141* 12/13/2011 1610     Past Medical History  Diagnosis Date  . Coronary atherosclerosis of native coronary artery     DES LAD 1/13, LVEF 35%  . MI (myocardial infarction)     AMI 1/13 - complicated by VT/Tosades  . Chronic back pain   . Anal fissure   . Rectal polyp   . Mixed hyperlipidemia   . PAD (peripheral artery disease)     Past Surgical History  Procedure Date  . Appendectomy   . Diagnostic laparoscopy   . Anal fissurectomy     Current Outpatient Prescriptions  Medication Sig Dispense Refill  . aspirin 81 MG tablet Take 162 mg by mouth daily.       . carvedilol (COREG) 6.25 MG tablet Take 1 tablet (6.25 mg total) by mouth 2 (two) times daily with a meal.  60 tablet  6  . cyclobenzaprine (FLEXERIL) 10 MG tablet Take 1 tablet (10 mg total) by mouth 2 (two) times daily as needed for  muscle spasms.  20 tablet  0  . enalapril (VASOTEC) 5 MG tablet Take 1 tablet (5 mg total) by mouth 2 (two) times daily.  60 tablet  6  . nitroGLYCERIN (NITROSTAT) 0.4 MG SL tablet Place 0.4 mg under the tongue every 5 (five) minutes as needed. For chest pain      . oxyCODONE-acetaminophen (ROXICET) 5-325 MG per tablet Take 1-2 tablets by mouth every 6 hours as needed for pain  180 tablet  0  . prasugrel (EFFIENT) 10 MG TABS Take 1 tablet (10 mg total) by mouth daily.  90 tablet  2  . simvastatin (ZOCOR) 40 MG tablet Take 1 tablet (40 mg total) by mouth every evening.  30 tablet  5  . spironolactone (ALDACTONE) 25 MG tablet Take 0.5 tablets (12.5 mg total) by mouth daily.  30 tablet  6    Allergies  Allergen Reactions  . Hydrocodone Nausea And  Vomiting    History   Social History  . Marital Status: Divorced    Spouse Name: N/A    Number of Children: N/A  . Years of Education: N/A   Occupational History  .  Other   Social History Main Topics  . Smoking status: Current Every Day Smoker -- 0.5 packs/day for 25 years    Types: Cigarettes  . Smokeless tobacco: Never Used     Comment: pt states that he has cut back  . Alcohol Use: 0.0 oz/week     Comment: Weekends  . Drug Use: No  . Sexually Active: Not on file   Other Topics Concern  . Not on file   Social History Narrative   [employer] - physical labor  - works night shift    Family History  Problem Relation Age of Onset  . Cancer Father     Lung  . Cancer Maternal Uncle     Colon  . Diabetes Mother   . COPD Mother     Review of Systems:  As stated in the HPI and otherwise negative.   BP 123/76  Pulse 74  Ht 5\' 9"  (1.753 m)  Wt 213 lb (96.616 kg)  BMI 31.45 kg/m2  Physical Examination: General: Well developed, well nourished, NAD HEENT: OP clear, mucus membranes moist SKIN: warm, dry. No rashes. Neuro: No focal deficits Musculoskeletal: Muscle strength 5/5 all ext Psychiatric: Mood and affect normal Neck: No JVD, no carotid bruits, no thyromegaly, no lymphadenopathy. Lungs:Clear bilaterally, no wheezes, rhonci, crackles Cardiovascular: Regular rate and rhythm. No murmurs, gallops or rubs. Abdomen:Soft. Bowel sounds present. Non-tender.  Extremities: No lower extremity edema. Pulses are non-palpable in the bilateral DP/PT.  Lexiscan myoview 08/15/12: Stress Procedure: The patient received IV Lexiscan 0.4 mg over 15-seconds. Technetium 57m Tetrofosmin injected at 30-seconds. There were no significant changes, sob, and nausea with Lexiscan. Quantitative spect images were obtained after a 45 minute delay.  Stress ECG: No significant change from baseline ECG  QPS  Raw Data Images: Images were motion corrected. Soft tissue (diaphragm,  subcutaneous fat) surround heart.  Stress Images: Small defect at apex Otherwise normal perfusion.  Rest Images: Normal homogeneous uptake in all areas of the myocardium.  Subtraction (SDS): Small region of ischemia.  Transient Ischemic Dilatation (Normal <1.22): 1.10  Lung/Heart Ratio (Normal <0.45): 0.42  Quantitative Gated Spect Images  QGS EDV: 113 ml  QGS ESV: 56 ml  Impression  Exercise Capacity: Lexiscan with no exercise.  BP Response: Normal blood pressure response.  Clinical Symptoms: No significant  symptoms noted.  ECG Impression: No significant ST segment change suggestive of ischemia.  Comparison with Prior Nuclear Study: no prior study  Overall Impression: Small region of apical ischemia. Otherwise normal perfusion. Overall low risk scan  LV Ejection Fraction: 50%. LV Wall Motion: Apical hypokinesis    Assessment and Plan:   1. PAD: Stable. He still has severe pain in legs with walking and pain in left arm but no rest pain or ulcerations.  Follow up with Dr. Myra Gianotti. Plans for lower ext revascularization and left arm revascularization after 12/11/12. We will try to move his f/u in [facility] up to this month if possible with CTA aorta/bifemoral before his visit in [facility].  Will also need consideration for left subclavian artery bypass in future. Will refill narcotics today for pain control.   2. Coronary atherosclerosis of native coronary artery: Lexiscan myoview low risk with no large areas of ischemia September 2013. He has been on dual antiplatelet therapy with ASA and Effient. He can stop his Effient in one more week. Continue statin and beta blocker. He does not need further ischemic workup before his planned surgical procedure.   3. Tobacco abuse: Complete cessation encouraged. He is trying to stop.

## 2012-12-05 NOTE — Patient Instructions (Signed)
Your physician wants you to follow-up in:  6 months. You will receive a reminder letter in the mail two months in advance. If you don't receive a letter, please call our office to schedule the follow-up appointment.   

## 2012-12-12 ENCOUNTER — Other Ambulatory Visit: Payer: Self-pay | Admitting: *Deleted

## 2012-12-12 DIAGNOSIS — I739 Peripheral vascular disease, unspecified: Secondary | ICD-10-CM

## 2012-12-12 DIAGNOSIS — Z48812 Encounter for surgical aftercare following surgery on the circulatory system: Secondary | ICD-10-CM

## 2012-12-13 ENCOUNTER — Encounter: Payer: Self-pay | Admitting: Surgery

## 2012-12-16 ENCOUNTER — Encounter (INDEPENDENT_AMBULATORY_CARE_PROVIDER_SITE_OTHER): Payer: Medicaid Other | Admitting: *Deleted

## 2012-12-16 ENCOUNTER — Other Ambulatory Visit: Payer: Medicaid Other

## 2012-12-16 ENCOUNTER — Ambulatory Visit (INDEPENDENT_AMBULATORY_CARE_PROVIDER_SITE_OTHER): Payer: Medicaid Other | Admitting: Surgery

## 2012-12-16 ENCOUNTER — Ambulatory Visit: Payer: Medicaid Other | Admitting: Surgery

## 2012-12-16 ENCOUNTER — Encounter: Payer: Self-pay | Admitting: Surgery

## 2012-12-16 VITALS — BP 133/83 | HR 45 | Ht 69.0 in | Wt 212.0 lb

## 2012-12-16 DIAGNOSIS — I739 Peripheral vascular disease, unspecified: Secondary | ICD-10-CM

## 2012-12-16 DIAGNOSIS — Z48812 Encounter for surgical aftercare following surgery on the circulatory system: Secondary | ICD-10-CM

## 2012-12-16 DIAGNOSIS — Z01810 Encounter for preprocedural cardiovascular examination: Secondary | ICD-10-CM

## 2012-12-16 HISTORY — DX: Peripheral vascular disease, unspecified: I73.9

## 2012-12-16 NOTE — Progress Notes (Signed)
[facility] [place]     Patient name: Ryan [name]            MRN: [id number]        DOB: 11/15/1966        Sex: male      Chief Complaint   Patient presents with   .  PVD       4 month f/u  NO CTA due to medicaid        HISTORY OF PRESENT ILLNESS: The patient is back today for followup. I initially met him in March of 2013. He suffered a MI in January 2013, and at that time he had a drug-eluting stent placed to his LAD. During this process, an abdominal aortogram revealed aortic occlusion at the level of the renal arteries. He also has a left subclavian artery occlusion. Because of his drug-eluting stent, I have not been able to proceed with surgical revascularization, because he has not been able to come off of his antiplatelet medication. He most recently saw Dr. McAlhaney who stopped his Effient.  The patient reports left arm numbness and weakness with activity. He does not have any neurologic symptoms. He suffers left greater than right leg claudication at 20-30 feet. He denies nonhealing wounds or rest pain.   He continues to be medically managed for his hypertension. He is on a statin for his hypercholesterolemia. He continues to smoke but has cut back to 6 cigarettes per day.      Past Medical History   Diagnosis  Date   .  Coronary atherosclerosis of native coronary artery         DES LAD 1/13, LVEF 35%   .  MI (myocardial infarction)         AMI 1/13 - complicated by VT/Tosades   .  Chronic back pain     .  Anal fissure     .  Rectal polyp     .  Mixed hyperlipidemia     .  PAD (peripheral artery disease)           Past Surgical History   Procedure  Date   .  Appendectomy     .  Diagnostic laparoscopy     .  Anal fissurectomy           History       Social History   .  Marital Status:  Divorced       Spouse Name:  N/A       Number of Children:  N/A   .  Years of Education:  N/A       Occupational History   .    Other        Social History Main Topics   .  Smoking status:  Current Every Day Smoker -- 1.0 packs/day for 25 years       Types:  Cigarettes   .  Smokeless tobacco:  Never Used   .  Alcohol Use:  0.0 oz/week         Comment: Weekends   .  Drug Use:  No   .  Sexually Active:  Not on file       Other Topics  Concern   .  Not on file       Social History Narrative     [employer] - physical labor  - works night shift         Family History     Problem  Relation  Age of Onset   .  Cancer  Father         Lung   .  Cancer  Maternal Uncle         Colon   .  Diabetes  Mother     .  COPD  Mother           Allergies as of 12/16/2012 - Review Complete 12/16/2012   Allergen  Reaction  Noted   .  Hydrocodone  Nausea And Vomiting  08/29/2012         Current Outpatient Prescriptions on File Prior to Visit   Medication  Sig  Dispense  Refill   .  aspirin 81 MG tablet  Take 162 mg by mouth daily.          .  carvedilol (COREG) 6.25 MG tablet  Take 1 tablet (6.25 mg total) by mouth 2 (two) times daily with a meal.   60 tablet   6   .  cyclobenzaprine (FLEXERIL) 10 MG tablet  Take 1 tablet (10 mg total) by mouth 2 (two) times daily as needed for muscle spasms.   20 tablet   0   .  enalapril (VASOTEC) 5 MG tablet  Take 1 tablet (5 mg total) by mouth 2 (two) times daily.   60 tablet   6   .  nitroGLYCERIN (NITROSTAT) 0.4 MG SL tablet  Place 0.4 mg under the tongue every 5 (five) minutes as needed. For chest pain         .  oxyCODONE-acetaminophen (ROXICET) 5-325 MG per tablet  Take 1-2 tablets by mouth every 6 hours as needed for pain   180 tablet   0   .  simvastatin (ZOCOR) 40 MG tablet  Take 1 tablet (40 mg total) by mouth every evening.   30 tablet   5   .  spironolactone (ALDACTONE) 25 MG tablet  Take 0.5 tablets (12.5 mg total) by mouth daily.   30 tablet   6   .  prasugrel (EFFIENT) 10 MG TABS  Take 1 tablet (10 mg total) by mouth daily.   90 tablet   2           REVIEW OF SYSTEMS: Cardiovascular: No chest pain, chest pressure, palpitations. Positive for pain in legs when walking Pulmonary: No productive cough, asthma or wheezing. Neurologic: Positive for numbness and weakness in his left arm with activity. No dizziness. Hematologic: No bleeding problems or clotting disorders. Musculoskeletal: No joint pain or joint swelling. Gastrointestinal: No blood in stool or hematemesis Genitourinary: No dysuria or hematuria. Psychiatric:: No history of major depression. Integumentary: No rashes or ulcers. Constitutional: No fever or chills.   PHYSICAL EXAMINATION:   Vital signs are BP 133/83  Pulse 45  Ht 5' 9" (1.753 m)  Wt 212 lb (96.163 kg)  BMI 31.31 kg/m2  SpO2 98% General: The patient appears their stated age. HEENT:  No gross abnormalities Pulmonary:  Non labored breathing Abdomen: Soft and non-tender right lower quadrant appendectomy incision Musculoskeletal: There are no major deformities. Neurologic: No focal weakness or paresthesias are detected, Skin: There are no ulcer or rashes noted. Psychiatric: The patient has normal affect. Cardiovascular: There is a regular rate and rhythm without significant murmur appreciated. No carotid bruits. Femoral pulses are not palpable     Diagnostic Studies I have reviewed his old carotid Dopplers which show minimal carotid disease. He has asymmetric brachial pressures Duplex was obtained today which

## 2012-12-16 NOTE — Addendum Note (Signed)
Addended by: Dannielle Karvonen on: 12/16/2012 02:29 PM   Modules accepted: Orders

## 2012-12-17 ENCOUNTER — Other Ambulatory Visit: Payer: Self-pay

## 2012-12-25 ENCOUNTER — Telehealth: Payer: Self-pay | Admitting: Cardiology

## 2012-12-25 DIAGNOSIS — I739 Peripheral vascular disease, unspecified: Secondary | ICD-10-CM

## 2012-12-25 NOTE — Telephone Encounter (Signed)
This is Dr McAlhany's patient. 

## 2012-12-25 NOTE — Telephone Encounter (Signed)
Pt requesting refill written rx for oxycodone [phone] will pick up when/if ready pls call

## 2012-12-26 MED ORDER — OXYCODONE-ACETAMINOPHEN 5-325 MG PO TABS: ORAL_TABLET | ORAL | Status: DC

## 2012-12-26 NOTE — Telephone Encounter (Signed)
F/u   Patient stating Dr. [name] has been filling his oxyconde 5-325 mg .

## 2012-12-26 NOTE — Telephone Encounter (Addendum)
Pt calling back wanting to know about pain med, said to tell you he is having surgery next month so went off blood thinner and he is taking more pain med because his blood is thicker

## 2012-12-26 NOTE — Telephone Encounter (Signed)
Spoke with pt and told him prescription would be at front desk for him to pick up.

## 2012-12-26 NOTE — Telephone Encounter (Signed)
Last filled December 05, 2012. Will check with Dr. [name] as to timing of refill

## 2012-12-26 NOTE — Telephone Encounter (Signed)
We can refill for him. Thanks, chris

## 2013-01-03 ENCOUNTER — Encounter (HOSPITAL_COMMUNITY): Payer: Self-pay | Admitting: Pharmacy Technician

## 2013-01-06 ENCOUNTER — Ambulatory Visit
Admission: RE | Admit: 2013-01-06 | Discharge: 2013-01-06 | Disposition: A | Discharge status: Home / Self Care | Payer: Medicaid Other | Source: Ambulatory Visit | Attending: Surgery | Admitting: Surgery

## 2013-01-06 ENCOUNTER — Other Ambulatory Visit: Payer: Self-pay | Admitting: Adult Health

## 2013-01-06 DIAGNOSIS — Z01810 Encounter for preprocedural cardiovascular examination: Secondary | ICD-10-CM

## 2013-01-06 DIAGNOSIS — I739 Peripheral vascular disease, unspecified: Secondary | ICD-10-CM

## 2013-01-06 MED ORDER — IOHEXOL 350 MG/ML SOLN
165.0000 mL | Freq: Once | INTRAVENOUS | Status: AC | PRN
  Administered 2013-01-06: 165 mL via INTRAVENOUS

## 2013-01-08 ENCOUNTER — Encounter (HOSPITAL_COMMUNITY): Payer: Self-pay

## 2013-01-08 ENCOUNTER — Encounter (HOSPITAL_COMMUNITY)
Admission: RE | Admit: 2013-01-08 | Discharge: 2013-01-08 | Disposition: A | Discharge status: Home / Self Care | Payer: Medicaid Other | Source: Ambulatory Visit | Attending: Surgery | Admitting: Surgery

## 2013-01-08 HISTORY — DX: Essential (primary) hypertension: I10

## 2013-01-08 HISTORY — DX: Type 2 diabetes mellitus without complications: E11.9

## 2013-01-08 LAB — COMPREHENSIVE METABOLIC PANEL
ALT: 100 U/L — ABNORMAL HIGH (ref 0–53)
Calcium: 9.4 mg/dL (ref 8.4–10.5)
Creatinine, Ser: 0.76 mg/dL (ref 0.50–1.35)
GFR calc Af Amer: >90 mL/min (ref >90)
Glucose, Bld: 266 mg/dL — ABNORMAL HIGH (ref 70–99)
Sodium: 136 mEq/L (ref 135–145)
Total Protein: 7.3 g/dL (ref 6.0–8.3)

## 2013-01-08 LAB — CBC
Hemoglobin: 15.5 g/dL (ref 13.0–17.0)
MCH: 32.6 pg (ref 26.0–34.0)
MCHC: 35.5 g/dL (ref 30.0–36.0)

## 2013-01-08 LAB — URINALYSIS, ROUTINE W REFLEX MICROSCOPIC
Glucose, UA: 100 mg/dL — AB
Hgb urine dipstick: NEGATIVE
Ketones, ur: NEGATIVE mg/dL
Protein, ur: NEGATIVE mg/dL
pH: 6 (ref 5.0–8.0)

## 2013-01-08 LAB — BLOOD GAS, ARTERIAL
Bicarbonate: 24.5 mEq/L — ABNORMAL HIGH (ref 20.0–24.0)
Drawn by: 181601
FIO2: 0.21 %
O2 Saturation: 97.5 %
Patient temperature: 98.6

## 2013-01-08 LAB — APTT: aPTT: 25 seconds (ref 24–37)

## 2013-01-08 LAB — ABO/RH: ABO/RH(D): O POS

## 2013-01-08 LAB — PROTIME-INR: Prothrombin Time: 12.2 seconds (ref 11.6–15.2)

## 2013-01-08 LAB — SURGICAL PCR SCREEN: MRSA, PCR: NEGATIVE

## 2013-01-08 NOTE — Pre-Procedure Instructions (Signed)
Ryan [name]  01/08/2013   Your procedure is scheduled on:  Thursday January 16, 2013  Report to [facility] at 5:30 AM.  Call this number if you have problems the morning of surgery: [phone]   Remember:   Do not eat food or drink liquids after midnight.   Take these medicines the morning of surgery with A SIP OF WATER: carvedilol, nitroglycerin(if needed), oxycodone, aspirin (as instructed by doctors office)   Do not wear jewelry, make-up or nail polish.  Do not wear lotions, powders, or perfumes.  Do not shave 48 hours prior to surgery. Men may shave face and neck.  Do not bring valuables to the hospital.  Contacts, dentures or bridgework may not be worn into surgery.  Leave suitcase in the car. After surgery it may be brought to your room.     Patients discharged the day of surgery will not be allowed to drive home.  Name and phone number of your driver: family / friend  Special Instructions: Shower using CHG 2 nights before surgery and the night before surgery.  If you shower the day of surgery use CHG.  Use special wash - you have one bottle of CHG for all showers.  You should use approximately 1/3 of the bottle for each shower.   Please read over the following fact sheets that you were given: Pain Booklet, Coughing and Deep Breathing, Blood Transfusion Information, MRSA Information and Surgical Site Infection Prevention

## 2013-01-08 NOTE — Progress Notes (Addendum)
Fowarded chart to anesthesia to review cardiac records and abnormal labs.   Echo in epic 02/16/12 Stress test in epic 08/15/12 Cardiac cath 2013 Patient's cardiologist Dr. Sanjuana Kava Patient positive for sleep apnea tool, does not have a pcp.

## 2013-01-09 NOTE — Consult Note (Signed)
Anesthesia Chart Review:  Patient is a 47 year old male scheduled for AFBG on 01/16/13 by Dr. Myra Gianotti.  History includes PAD with aortic occlusion at the level of the renal arteries and left subclavian artery occlusion, smoking, obesity, CAD, anterior STEMI complicated by VT/cardiac arrest 12/2011 s/p DES LAD, HLD, HTN, GERD, nephrolithiasis, "borderline" DM2.  He could not undergo surgical revascularization until he had been on Effient for at least one year following his stent.  Dr. Myra Gianotti plans to address his left SCA occlusion [facility].  Cardiologist is Dr. [name], see his last note from 12/25/12.  He does not have a PCP.  Nuclear stress test on 08/15/12 showed: Small region of apical ischemia. Otherwise normal perfusion. Overall low risk scan LV Ejection Fraction: 50%. LV Wall Motion: Apical hypokinesis.    Echo on 02/16/12 showed: - Left ventricle: The cavity size was normal. Wall thickness was increased in a pattern of mild LVH. The estimated ejection fraction was 55%. - Left atrium: The atrium was mildly dilated. - Atrial septum: No defect or patent foramen ovale was identified. - Trivial mitral and pulmonic regurgitation.  Mild tricuspid regurgitation.    Cardiac cath on 12/11/10 showed: 1. Acute anterior STEMI secondary to subtotally occluded mid LAD.  2. Successful PTCA with placement of a DES in the mid LAD.  3. Chronically occluded third OM branch with collateral filling.  4. Moderate LV systolic dysfunction, EF 35%.  Hypokinesis of the anterior wall and apex.  5. S/p cardiac arrest.  EKG on 08/29/12 showed NSR, cannot rule out anterior infarct (age undetermined), inferolateral T wave abnormality, consider ischemia.  T wave abnormality is more evident when compared to his previous EKG on 01/24/12.  CXR on 08/29/12 showed no active disease.  Preoperative labs showed a non-fasting glucose of 266.  He reported history of borderline DM, but has not been started on a DM  medication regimen.  AST/ALT 47/100 (up from 39/74 on 08/29/12).  His PLT count and PT/PTT are WNL.  CMET results routed to Dr. Myra Gianotti and VVS nurses Darel Hong and Okey Regal noting elevated LFTs and glucose results.  Patient will need a CBG on arrival and treatment of hyperglycemia as needed during the perioperative period.      Patient is now s/p > 1 year following his MI.  He had a low risk stress test less than 6 months ago and has had close cardiology follow-up.  His cardiologist is aware of need for vascular surgery intervention.  Patient will be evaluated by his assigned anesthesiologist on the day of surgery.  If no new/acute CV symptoms then would anticipate he could proceed as planned.  Shonna Chock, PA-C 01/10/13 1306

## 2013-01-10 NOTE — Anesthesia Preprocedure Evaluation (Addendum)
Anesthesia Evaluation  Patient identified by MRN, date of birth, ID band Patient awake    Reviewed: Allergy & Precautions, H&P , NPO status , Patient's Chart, lab work & pertinent test results, reviewed documented beta blocker date and time   Airway Mallampati: II TM Distance: >3 FB Neck ROM: Full    Dental  (+) Partial Upper, Dental Advisory Given and Poor Dentition   Pulmonary Current Smoker,    Pulmonary exam normal       Cardiovascular hypertension, Pt. on medications and Pt. on home beta blockers + CAD, + Past MI, + Cardiac Stents and + Peripheral Vascular Disease  left subclavian artery occlusion   Neuro/Psych    GI/Hepatic GERD-  ,  Endo/Other  diabetes, Poorly Controlled, Type 2  Renal/GU      Musculoskeletal   Abdominal Normal abdominal exam  (+)   Peds  Hematology   Anesthesia Other Findings   Reproductive/Obstetrics                          Anesthesia Physical Anesthesia Plan  ASA: III  Anesthesia Plan: General   Post-op Pain Management:    Induction: Intravenous  Airway Management Planned: Oral ETT  Additional Equipment: Arterial line, CVP, PA Cath and Ultrasound Guidance Line Placement  Intra-op Plan:   Post-operative Plan: Extubation in OR  Informed Consent: I have reviewed the patients History and Physical, chart, labs and discussed the procedure including the risks, benefits and alternatives for the proposed anesthesia with the patient or authorized representative who has indicated his/her understanding and acceptance.   Dental advisory given  Plan Discussed with: CRNA, Anesthesiologist and Surgeon  Anesthesia Plan Comments: (No tylenol)       Anesthesia Quick Evaluation

## 2013-01-13 ENCOUNTER — Other Ambulatory Visit: Payer: Self-pay | Admitting: *Deleted

## 2013-01-14 ENCOUNTER — Other Ambulatory Visit: Payer: Self-pay | Admitting: *Deleted

## 2013-01-14 MED ORDER — NITROGLYCERIN 0.4 MG SL SUBL: 0.4000 mg | SUBLINGUAL_TABLET | SUBLINGUAL | Status: DC | PRN

## 2013-01-15 ENCOUNTER — Encounter: Payer: Self-pay | Admitting: Surgery

## 2013-01-15 MED ORDER — DEXTROSE 5 % IV SOLN
1.5000 g | INTRAVENOUS | Status: AC
  Administered 2013-01-16 (×2): 1.5 g via INTRAVENOUS
  Filled 2013-01-15: qty 1.5

## 2013-01-16 ENCOUNTER — Encounter (HOSPITAL_COMMUNITY): Payer: Self-pay | Admitting: Vascular Surgery

## 2013-01-16 ENCOUNTER — Inpatient Hospital Stay (HOSPITAL_COMMUNITY): Payer: Medicaid Other

## 2013-01-16 ENCOUNTER — Inpatient Hospital Stay (HOSPITAL_COMMUNITY)
Admission: RE | Admit: 2013-01-16 | Discharge: 2013-01-22 | DRG: 237 | Disposition: A | Discharge status: Home Health | Payer: Medicaid Other | Source: Ambulatory Visit | Attending: Surgery | Admitting: Surgery

## 2013-01-16 ENCOUNTER — Encounter (HOSPITAL_COMMUNITY)
Admission: RE | Disposition: A | Discharge status: Home / Self Care | Payer: Self-pay | Source: Ambulatory Visit | Attending: Surgery

## 2013-01-16 ENCOUNTER — Encounter (HOSPITAL_COMMUNITY): Payer: Self-pay | Admitting: *Deleted

## 2013-01-16 ENCOUNTER — Inpatient Hospital Stay (HOSPITAL_COMMUNITY): Payer: Medicaid Other | Admitting: Vascular Surgery

## 2013-01-16 DIAGNOSIS — I252 Old myocardial infarction: Secondary | ICD-10-CM

## 2013-01-16 DIAGNOSIS — I10 Essential (primary) hypertension: Secondary | ICD-10-CM | POA: Diagnosis present

## 2013-01-16 DIAGNOSIS — E7800 Pure hypercholesterolemia, unspecified: Secondary | ICD-10-CM | POA: Diagnosis present

## 2013-01-16 DIAGNOSIS — I2510 Atherosclerotic heart disease of native coronary artery without angina pectoris: Secondary | ICD-10-CM

## 2013-01-16 DIAGNOSIS — J95821 Acute postprocedural respiratory failure: Secondary | ICD-10-CM

## 2013-01-16 DIAGNOSIS — E782 Mixed hyperlipidemia: Secondary | ICD-10-CM

## 2013-01-16 DIAGNOSIS — G8929 Other chronic pain: Secondary | ICD-10-CM | POA: Diagnosis present

## 2013-01-16 DIAGNOSIS — R000 Tachycardia, unspecified: Secondary | ICD-10-CM | POA: Diagnosis not present

## 2013-01-16 DIAGNOSIS — M549 Dorsalgia, unspecified: Secondary | ICD-10-CM | POA: Diagnosis present

## 2013-01-16 DIAGNOSIS — G8918 Other acute postprocedural pain: Secondary | ICD-10-CM

## 2013-01-16 DIAGNOSIS — Y832 Surgical operation with anastomosis, bypass or graft as the cause of abnormal reaction of the patient, or of later complication, without mention of misadventure at the time of the procedure: Secondary | ICD-10-CM | POA: Diagnosis not present

## 2013-01-16 DIAGNOSIS — Z7902 Long term (current) use of antithrombotics/antiplatelets: Secondary | ICD-10-CM

## 2013-01-16 DIAGNOSIS — K929 Disease of digestive system, unspecified: Secondary | ICD-10-CM | POA: Diagnosis not present

## 2013-01-16 DIAGNOSIS — Z9861 Coronary angioplasty status: Secondary | ICD-10-CM

## 2013-01-16 DIAGNOSIS — K560 Paralytic ileus: Secondary | ICD-10-CM | POA: Diagnosis not present

## 2013-01-16 DIAGNOSIS — K219 Gastro-esophageal reflux disease without esophagitis: Secondary | ICD-10-CM | POA: Diagnosis present

## 2013-01-16 DIAGNOSIS — Z860100 Personal history of colon polyps, unspecified: Secondary | ICD-10-CM

## 2013-01-16 DIAGNOSIS — I9589 Other hypotension: Secondary | ICD-10-CM | POA: Diagnosis not present

## 2013-01-16 DIAGNOSIS — I959 Hypotension, unspecified: Secondary | ICD-10-CM

## 2013-01-16 DIAGNOSIS — I7409 Other arterial embolism and thrombosis of abdominal aorta: Principal | ICD-10-CM | POA: Diagnosis present

## 2013-01-16 DIAGNOSIS — Z7982 Long term (current) use of aspirin: Secondary | ICD-10-CM

## 2013-01-16 DIAGNOSIS — E119 Type 2 diabetes mellitus without complications: Secondary | ICD-10-CM

## 2013-01-16 DIAGNOSIS — I739 Peripheral vascular disease, unspecified: Secondary | ICD-10-CM

## 2013-01-16 DIAGNOSIS — I70219 Atherosclerosis of native arteries of extremities with intermittent claudication, unspecified extremity: Secondary | ICD-10-CM

## 2013-01-16 DIAGNOSIS — I771 Stricture of artery: Secondary | ICD-10-CM | POA: Diagnosis present

## 2013-01-16 DIAGNOSIS — F17200 Nicotine dependence, unspecified, uncomplicated: Secondary | ICD-10-CM | POA: Diagnosis present

## 2013-01-16 DIAGNOSIS — I700 Atherosclerosis of aorta: Secondary | ICD-10-CM | POA: Diagnosis present

## 2013-01-16 DIAGNOSIS — E876 Hypokalemia: Secondary | ICD-10-CM | POA: Diagnosis not present

## 2013-01-16 DIAGNOSIS — Z79899 Other long term (current) drug therapy: Secondary | ICD-10-CM

## 2013-01-16 HISTORY — PX: AORTA - BILATERAL FEMORAL ARTERY BYPASS GRAFT: SHX1175

## 2013-01-16 LAB — BASIC METABOLIC PANEL
BUN: 20 mg/dL (ref 6–23)
CO2: 21 mEq/L (ref 19–32)
Calcium: 6.6 mg/dL — ABNORMAL LOW (ref 8.4–10.5)
Chloride: 105 mEq/L (ref 96–112)
Creatinine, Ser: 1.45 mg/dL — ABNORMAL HIGH (ref 0.50–1.35)
GFR calc Af Amer: 65 mL/min — ABNORMAL LOW (ref >90)
GFR calc non Af Amer: 56 mL/min — ABNORMAL LOW (ref >90)
Glucose, Bld: 293 mg/dL — ABNORMAL HIGH (ref 70–99)
Potassium: 5.7 mEq/L — ABNORMAL HIGH (ref 3.5–5.1)
Sodium: 137 mEq/L (ref 135–145)

## 2013-01-16 LAB — POCT I-STAT 3, ART BLOOD GAS (G3+)
Acid-base deficit: 5 mmol/L — ABNORMAL HIGH (ref 0.0–2.0)
Bicarbonate: 22.9 mEq/L (ref 20.0–24.0)
Bicarbonate: 18.8 mEq/L — ABNORMAL LOW (ref 20.0–24.0)
O2 Saturation: 96 %
O2 Saturation: 98 %
Patient temperature: 37.3
Patient temperature: 38.1
TCO2: 24 mmol/L (ref 0–100)
TCO2: 20 mmol/L (ref 0–100)
pCO2 arterial: 52.8 mmHg — ABNORMAL HIGH (ref 35.0–45.0)
pCO2 arterial: 36.3 mmHg (ref 35.0–45.0)
pH, Arterial: 7.247 — ABNORMAL LOW (ref 7.350–7.450)
pH, Arterial: 7.328 — ABNORMAL LOW (ref 7.350–7.450)
pO2, Arterial: 94 mmHg (ref 80.0–100.0)

## 2013-01-16 LAB — POCT I-STAT 7, (LYTES, BLD GAS, ICA,H+H)
Calcium, Ion: 1.1 mmol/L — ABNORMAL LOW (ref 1.12–1.23)
Calcium, Ion: 0.99 mmol/L — ABNORMAL LOW (ref 1.12–1.23)
HCT: 39 % (ref 39.0–52.0)
Hemoglobin: 13.9 g/dL (ref 13.0–17.0)
Hemoglobin: 13.3 g/dL (ref 13.0–17.0)
O2 Saturation: 100 %
Potassium: 6.2 mEq/L — ABNORMAL HIGH (ref 3.5–5.1)
TCO2: 27 mmol/L (ref 0–100)
TCO2: 23 mmol/L (ref 0–100)
pCO2 arterial: 48.9 mmHg — ABNORMAL HIGH (ref 35.0–45.0)
pCO2 arterial: 40.8 mmHg (ref 35.0–45.0)
pH, Arterial: 7.337 — ABNORMAL LOW (ref 7.350–7.450)
pO2, Arterial: 349 mmHg — ABNORMAL HIGH (ref 80.0–100.0)

## 2013-01-16 LAB — POCT I-STAT 4, (NA,K, GLUC, HGB,HCT)
Glucose, Bld: 243 mg/dL — ABNORMAL HIGH (ref 70–99)
Glucose, Bld: 260 mg/dL — ABNORMAL HIGH (ref 70–99)
Glucose, Bld: 269 mg/dL — ABNORMAL HIGH (ref 70–99)
HCT: 41 % (ref 39.0–52.0)
HCT: 40 % (ref 39.0–52.0)
Hemoglobin: 12.9 g/dL — ABNORMAL LOW (ref 13.0–17.0)
Hemoglobin: 13.6 g/dL (ref 13.0–17.0)
Potassium: 4.7 mEq/L (ref 3.5–5.1)
Sodium: 135 mEq/L (ref 135–145)
Sodium: 139 mEq/L (ref 135–145)

## 2013-01-16 LAB — POCT I-STAT, CHEM 8
BUN: 21 mg/dL (ref 6–23)
Calcium, Ion: 0.95 mmol/L — ABNORMAL LOW (ref 1.12–1.23)
Chloride: 109 mEq/L (ref 96–112)
Creatinine, Ser: 1.5 mg/dL — ABNORMAL HIGH (ref 0.50–1.35)
TCO2: 24 mmol/L (ref 0–100)

## 2013-01-16 LAB — GLUCOSE, CAPILLARY
Glucose-Capillary: 253 mg/dL — ABNORMAL HIGH (ref 70–99)
Glucose-Capillary: 267 mg/dL — ABNORMAL HIGH (ref 70–99)

## 2013-01-16 LAB — PROTIME-INR: Prothrombin Time: 14.5 seconds (ref 11.6–15.2)

## 2013-01-16 LAB — CBC
HCT: 41.1 % (ref 39.0–52.0)
Hemoglobin: 14.2 g/dL (ref 13.0–17.0)
MCH: 32.4 pg (ref 26.0–34.0)
MCHC: 34.5 g/dL (ref 30.0–36.0)
MCV: 93.8 fL (ref 78.0–100.0)
Platelets: 130 10*3/uL — ABNORMAL LOW (ref 150–400)
RBC: 4.38 MIL/uL (ref 4.22–5.81)
RDW: 13.4 % (ref 11.5–15.5)
WBC: 25 10*3/uL — ABNORMAL HIGH (ref 4.0–10.5)

## 2013-01-16 LAB — MAGNESIUM: Magnesium: 1.4 mg/dL — ABNORMAL LOW (ref 1.5–2.5)

## 2013-01-16 LAB — APTT: aPTT: 26 seconds (ref 24–37)

## 2013-01-16 SURGERY — CREATION, BYPASS, ARTERIAL, AORTA TO FEMORAL, BILATERAL, USING GRAFT
Anesthesia: General | Site: Abdomen | Wound class: Clean

## 2013-01-16 MED ORDER — DEXMEDETOMIDINE HCL IN NACL 400 MCG/100ML IV SOLN
0.4000 ug/kg/h | Freq: Once | INTRAVENOUS | Status: DC
  Filled 2013-01-16: qty 100

## 2013-01-16 MED ORDER — PHENYLEPHRINE HCL 10 MG/ML IJ SOLN
10.0000 mg | INTRAMUSCULAR | Status: DC | PRN
  Administered 2013-01-16: 10 ug/min via INTRAVENOUS

## 2013-01-16 MED ORDER — DEXTROSE 5 % IV SOLN
1.5000 g | Freq: Two times a day (BID) | INTRAVENOUS | Status: AC
  Administered 2013-01-16 – 2013-01-17 (×2): 1.5 g via INTRAVENOUS
  Filled 2013-01-16 (×2): qty 1.5

## 2013-01-16 MED ORDER — PHENOL 1.4 % MT LIQD: 1.0000 | OROMUCOSAL | Status: DC | PRN

## 2013-01-16 MED ORDER — HYDRALAZINE HCL 20 MG/ML IJ SOLN
10.0000 mg | INTRAMUSCULAR | Status: DC | PRN
  Filled 2013-01-16: qty 0.5

## 2013-01-16 MED ORDER — FENTANYL CITRATE 0.05 MG/ML IJ SOLN
100.0000 ug | INTRAMUSCULAR | Status: DC | PRN
  Administered 2013-01-16: 100 ug via INTRAVENOUS
  Administered 2013-01-16: 50 ug via INTRAVENOUS
  Administered 2013-01-17 (×2): 100 ug via INTRAVENOUS
  Filled 2013-01-16 (×4): qty 2

## 2013-01-16 MED ORDER — CEFUROXIME SODIUM 750 MG IJ SOLR
INTRAMUSCULAR | Status: AC
  Filled 2013-01-16: qty 750

## 2013-01-16 MED ORDER — NITROGLYCERIN 0.4 MG SL SUBL: 0.4000 mg | SUBLINGUAL_TABLET | SUBLINGUAL | Status: DC | PRN

## 2013-01-16 MED ORDER — LIDOCAINE HCL (CARDIAC) 20 MG/ML IV SOLN
INTRAVENOUS | Status: DC | PRN
  Administered 2013-01-16: 80 mg via INTRAVENOUS

## 2013-01-16 MED ORDER — MIDAZOLAM HCL 5 MG/5ML IJ SOLN
INTRAMUSCULAR | Status: DC | PRN
  Administered 2013-01-16: 2 mg via INTRAVENOUS

## 2013-01-16 MED ORDER — SODIUM CHLORIDE 0.9 % IV SOLN: 500.0000 mL | Freq: Once | INTRAVENOUS | Status: AC | PRN

## 2013-01-16 MED ORDER — ACETAMINOPHEN 325 MG RE SUPP
325.0000 mg | RECTAL | Status: DC | PRN
  Filled 2013-01-16: qty 2

## 2013-01-16 MED ORDER — VECURONIUM BROMIDE 10 MG IV SOLR
INTRAVENOUS | Status: DC | PRN
  Administered 2013-01-16 (×2): 2 mg via INTRAVENOUS
  Administered 2013-01-16 (×2): 1 mg via INTRAVENOUS
  Administered 2013-01-16: 3 mg via INTRAVENOUS
  Administered 2013-01-16 (×2): 2 mg via INTRAVENOUS
  Administered 2013-01-16: 5 mg via INTRAVENOUS
  Administered 2013-01-16: 2 mg via INTRAVENOUS

## 2013-01-16 MED ORDER — HYDROMORPHONE HCL PF 1 MG/ML IJ SOLN: 0.5000 mg | INTRAMUSCULAR | Status: DC | PRN

## 2013-01-16 MED ORDER — LABETALOL HCL 5 MG/ML IV SOLN: 10.0000 mg | INTRAVENOUS | Status: DC | PRN

## 2013-01-16 MED ORDER — SODIUM CHLORIDE 0.9 % IV BOLUS (SEPSIS)
500.0000 mL | Freq: Once | INTRAVENOUS | Status: AC
Start: 2013-01-16 — End: 2013-01-16
  Administered 2013-01-16: 500 mL via INTRAVENOUS

## 2013-01-16 MED ORDER — EPHEDRINE SULFATE 50 MG/ML IJ SOLN
INTRAMUSCULAR | Status: DC | PRN
  Administered 2013-01-16: 5 mg via INTRAVENOUS

## 2013-01-16 MED ORDER — HEMOSTATIC AGENTS (NO CHARGE) OPTIME
TOPICAL | Status: DC | PRN
  Administered 2013-01-16 (×3): 1 via TOPICAL

## 2013-01-16 MED ORDER — MANNITOL 25 % IV SOLN
INTRAVENOUS | Status: DC | PRN
  Administered 2013-01-16: 25 g via INTRAVENOUS

## 2013-01-16 MED ORDER — DEXMEDETOMIDINE HCL IN NACL 200 MCG/50ML IV SOLN
INTRAVENOUS | Status: DC | PRN
  Administered 2013-01-16: 0.4 ug/kg/h via INTRAVENOUS

## 2013-01-16 MED ORDER — METOPROLOL TARTRATE 1 MG/ML IV SOLN
2.0000 mg | INTRAVENOUS | Status: DC | PRN
  Administered 2013-01-17: 2.5 mg via INTRAVENOUS

## 2013-01-16 MED ORDER — FENTANYL CITRATE 0.05 MG/ML IJ SOLN
INTRAMUSCULAR | Status: AC
  Filled 2013-01-16: qty 2

## 2013-01-16 MED ORDER — SODIUM CHLORIDE 0.9 % IV SOLN
INTRAVENOUS | Status: DC | PRN
  Administered 2013-01-16: 13:00:00 via INTRAVENOUS

## 2013-01-16 MED ORDER — SODIUM CHLORIDE 0.9 % IV SOLN
INTRAVENOUS | Status: DC
  Administered 2013-01-16: 2.1 [IU]/h via INTRAVENOUS
  Filled 2013-01-16 (×3): qty 1

## 2013-01-16 MED ORDER — 0.9 % SODIUM CHLORIDE (POUR BTL) OPTIME
TOPICAL | Status: DC | PRN
  Administered 2013-01-16: 2000 mL
  Administered 2013-01-16: 1000 mL
  Administered 2013-01-16: 2000 mL

## 2013-01-16 MED ORDER — PANTOPRAZOLE SODIUM 40 MG IV SOLR
40.0000 mg | Freq: Every day | INTRAVENOUS | Status: DC
  Administered 2013-01-16 – 2013-01-19 (×4): 40 mg via INTRAVENOUS
  Filled 2013-01-16 (×5): qty 40

## 2013-01-16 MED ORDER — DOPAMINE-DEXTROSE 3.2-5 MG/ML-% IV SOLN: 3.0000 ug/kg/min | INTRAVENOUS | Status: DC | PRN

## 2013-01-16 MED ORDER — INSULIN REGULAR HUMAN 100 UNIT/ML IJ SOLN
10.0000 [IU] | Freq: Once | INTRAMUSCULAR | Status: DC
  Filled 2013-01-16: qty 0.1

## 2013-01-16 MED ORDER — LIDOCAINE HCL 4 % MT SOLN
OROMUCOSAL | Status: DC | PRN
  Administered 2013-01-16: 4 mL via TOPICAL

## 2013-01-16 MED ORDER — METOPROLOL TARTRATE 1 MG/ML IV SOLN: 5.0000 mg | Freq: Four times a day (QID) | INTRAVENOUS | Status: DC

## 2013-01-16 MED ORDER — MAGNESIUM SULFATE 40 MG/ML IJ SOLN
2.0000 g | Freq: Every day | INTRAMUSCULAR | Status: DC | PRN
  Filled 2013-01-16: qty 50

## 2013-01-16 MED ORDER — DEXTROSE 50 % IV SOLN: 1.0000 | Freq: Once | INTRAVENOUS | Status: DC

## 2013-01-16 MED ORDER — FENTANYL CITRATE 0.05 MG/ML IJ SOLN
INTRAMUSCULAR | Status: DC | PRN
  Administered 2013-01-16 (×4): 50 ug via INTRAVENOUS
  Administered 2013-01-16 (×2): 100 ug via INTRAVENOUS
  Administered 2013-01-16 (×10): 50 ug via INTRAVENOUS
  Administered 2013-01-16: 100 ug via INTRAVENOUS

## 2013-01-16 MED ORDER — INSULIN ASPART 100 UNIT/ML IV SOLN
INTRAVENOUS | Status: DC | PRN
  Administered 2013-01-16: 10 [IU] via INTRAVENOUS

## 2013-01-16 MED ORDER — ALBUMIN HUMAN 5 % IV SOLN
INTRAVENOUS | Status: DC | PRN
  Administered 2013-01-16 (×4): via INTRAVENOUS

## 2013-01-16 MED ORDER — METOPROLOL TARTRATE 1 MG/ML IV SOLN
2.5000 mg | Freq: Four times a day (QID) | INTRAVENOUS | Status: DC
  Administered 2013-01-17: via INTRAVENOUS
  Administered 2013-01-17 – 2013-01-19 (×7): 2.5 mg via INTRAVENOUS
  Filled 2013-01-16 (×15): qty 5

## 2013-01-16 MED ORDER — PHENYLEPHRINE HCL 10 MG/ML IJ SOLN
30.0000 ug/min | Freq: Once | INTRAVENOUS | Status: DC
  Filled 2013-01-16: qty 1

## 2013-01-16 MED ORDER — KCL IN DEXTROSE-NACL 20-5-0.9 MEQ/L-%-% IV SOLN
INTRAVENOUS | Status: DC
  Filled 2013-01-16 (×2): qty 1000

## 2013-01-16 MED ORDER — ACETAMINOPHEN 325 MG PO TABS
325.0000 mg | ORAL_TABLET | ORAL | Status: DC | PRN
Start: 2013-01-16 — End: 2013-01-22

## 2013-01-16 MED ORDER — ALBUTEROL SULFATE HFA 108 (90 BASE) MCG/ACT IN AERS
INHALATION_SPRAY | RESPIRATORY_TRACT | Status: DC | PRN
  Administered 2013-01-16: 2 via RESPIRATORY_TRACT
  Administered 2013-01-16: 4 via RESPIRATORY_TRACT

## 2013-01-16 MED ORDER — NOREPINEPHRINE BITARTRATE 1 MG/ML IJ SOLN
2.0000 ug/min | INTRAVENOUS | Status: DC
  Administered 2013-01-16: 5 ug/min via INTRAVENOUS
  Filled 2013-01-16: qty 4

## 2013-01-16 MED ORDER — SODIUM CHLORIDE 0.9 % IV SOLN: INTRAVENOUS | Status: DC

## 2013-01-16 MED ORDER — ONDANSETRON HCL 4 MG/2ML IJ SOLN: 4.0000 mg | Freq: Four times a day (QID) | INTRAMUSCULAR | Status: DC | PRN

## 2013-01-16 MED ORDER — SODIUM BICARBONATE 8.4 % IV SOLN: 50.0000 meq | Freq: Once | INTRAVENOUS | Status: DC

## 2013-01-16 MED ORDER — LACTATED RINGERS IV SOLN
INTRAVENOUS | Status: DC | PRN
  Administered 2013-01-16 (×5): via INTRAVENOUS

## 2013-01-16 MED ORDER — POTASSIUM CHLORIDE CRYS ER 20 MEQ PO TBCR: 20.0000 meq | EXTENDED_RELEASE_TABLET | Freq: Every day | ORAL | Status: DC | PRN

## 2013-01-16 MED ORDER — ROCURONIUM BROMIDE 100 MG/10ML IV SOLN
INTRAVENOUS | Status: DC | PRN
  Administered 2013-01-16: 30 mg via INTRAVENOUS
  Administered 2013-01-16: 50 mg via INTRAVENOUS
  Administered 2013-01-16: 10 mg via INTRAVENOUS
  Administered 2013-01-16 (×2): 20 mg via INTRAVENOUS
  Administered 2013-01-16: 10 mg via INTRAVENOUS
  Administered 2013-01-16 (×3): 20 mg via INTRAVENOUS

## 2013-01-16 MED ORDER — SODIUM CHLORIDE 0.9 % IR SOLN
Status: DC | PRN
  Administered 2013-01-16: 08:00:00

## 2013-01-16 MED ORDER — PROPOFOL 10 MG/ML IV EMUL
INTRAVENOUS | Status: DC | PRN
  Administered 2013-01-16: 180 mg via INTRAVENOUS

## 2013-01-16 MED ORDER — MIDAZOLAM HCL 2 MG/2ML IJ SOLN
INTRAMUSCULAR | Status: AC
  Filled 2013-01-16: qty 2

## 2013-01-16 MED ORDER — SODIUM CHLORIDE 0.9 % IV SOLN
INTRAVENOUS | Status: DC | PRN
  Administered 2013-01-16 (×4): via INTRAVENOUS

## 2013-01-16 MED ORDER — SODIUM CHLORIDE 0.9 % IV SOLN
INTRAVENOUS | Status: DC
  Administered 2013-01-16 – 2013-01-18 (×5): via INTRAVENOUS
  Administered 2013-01-19: 100 mL/h via INTRAVENOUS
  Administered 2013-01-19: 05:00:00 via INTRAVENOUS

## 2013-01-16 MED ORDER — PROTAMINE SULFATE 10 MG/ML IV SOLN
INTRAVENOUS | Status: DC | PRN
  Administered 2013-01-16: 50 mg via INTRAVENOUS

## 2013-01-16 MED ORDER — HEPARIN SODIUM (PORCINE) 1000 UNIT/ML IJ SOLN
INTRAMUSCULAR | Status: DC | PRN
  Administered 2013-01-16: 2000 [IU] via INTRAVENOUS
  Administered 2013-01-16: 8000 [IU] via INTRAVENOUS
  Administered 2013-01-16 (×2): 2000 [IU] via INTRAVENOUS

## 2013-01-16 MED ORDER — SODIUM BICARBONATE 8.4 % IV SOLN
INTRAVENOUS | Status: DC | PRN
  Administered 2013-01-16 (×2): 50 meq via INTRAVENOUS

## 2013-01-16 MED ORDER — DEXTROSE 50 % IV SOLN
INTRAVENOUS | Status: DC | PRN
  Administered 2013-01-16: 25 g via INTRAVENOUS

## 2013-01-16 MED ORDER — DEXMEDETOMIDINE HCL IN NACL 200 MCG/50ML IV SOLN
0.2000 ug/kg/h | INTRAVENOUS | Status: DC
  Administered 2013-01-16 (×2): 0.7 ug/kg/h via INTRAVENOUS
  Filled 2013-01-16 (×3): qty 50

## 2013-01-16 SURGICAL SUPPLY — 65 items
CANISTER SUCTION 2500CC (MISCELLANEOUS) ×2 IMPLANT
CLIP TI MEDIUM 24 (CLIP) ×2 IMPLANT
CLIP TI WIDE RED SMALL 24 (CLIP) ×2 IMPLANT
CLOTH BEACON ORANGE TIMEOUT ST (SAFETY) ×2 IMPLANT
CONT SPEC 4OZ CLIKSEAL STRL BL (MISCELLANEOUS) ×2 IMPLANT
COVER MAYO STAND STRL (DRAPES) ×2 IMPLANT
COVER SURGICAL LIGHT HANDLE (MISCELLANEOUS) ×2 IMPLANT
DERMABOND ADVANCED (GAUZE/BANDAGES/DRESSINGS) ×5
DERMABOND ADVANCED .7 DNX12 (GAUZE/BANDAGES/DRESSINGS) ×5 IMPLANT
DRAPE WARM FLUID 44X44 (DRAPE) ×2 IMPLANT
ELECT BLADE 4.0 EZ CLEAN MEGAD (MISCELLANEOUS) ×4
ELECT BLADE 6.5 EXT (BLADE) IMPLANT
ELECT REM PT RETURN 9FT ADLT (ELECTROSURGICAL) ×4
ELECTRODE BLDE 4.0 EZ CLN MEGD (MISCELLANEOUS) ×2 IMPLANT
ELECTRODE REM PT RTRN 9FT ADLT (ELECTROSURGICAL) ×2 IMPLANT
GLOVE BIOGEL M 6.5 STRL (GLOVE) ×2 IMPLANT
GLOVE BIOGEL M 7.0 STRL (GLOVE) ×2 IMPLANT
GLOVE BIOGEL PI IND STRL 6.5 (GLOVE) ×2 IMPLANT
GLOVE BIOGEL PI IND STRL 7.0 (GLOVE) ×6 IMPLANT
GLOVE BIOGEL PI IND STRL 7.5 (GLOVE) ×2 IMPLANT
GLOVE BIOGEL PI INDICATOR 6.5 (GLOVE) ×2
GLOVE BIOGEL PI INDICATOR 7.0 (GLOVE) ×6
GLOVE BIOGEL PI INDICATOR 7.5 (GLOVE) ×2
GLOVE ECLIPSE 7.0 STRL STRAW (GLOVE) ×6 IMPLANT
GLOVE SURG SS PI 7.0 STRL IVOR (GLOVE) ×2 IMPLANT
GLOVE SURG SS PI 7.5 STRL IVOR (GLOVE) ×2 IMPLANT
GOWN PREVENTION PLUS XXLARGE (GOWN DISPOSABLE) ×2 IMPLANT
GOWN STRL NON-REIN LRG LVL3 (GOWN DISPOSABLE) ×16 IMPLANT
GOWN STRL REIN XL XLG (GOWN DISPOSABLE) ×2 IMPLANT
GRAFT HEMASHIELD 14X8MM (Vascular Products) ×2 IMPLANT
GRAFT HEMASHIELD 16X8MM (Vascular Products) ×2 IMPLANT
HEMOSTAT SNOW SURGICEL 2X4 (HEMOSTASIS) IMPLANT
HEMOSTAT SURGICEL 2X14 (HEMOSTASIS) IMPLANT
INSERT FOGARTY 61MM (MISCELLANEOUS) ×2 IMPLANT
INSERT FOGARTY SM (MISCELLANEOUS) ×4 IMPLANT
KIT BASIN OR (CUSTOM PROCEDURE TRAY) ×2 IMPLANT
KIT ROOM TURNOVER OR (KITS) ×2 IMPLANT
LOOP VESSEL MINI RED (MISCELLANEOUS) ×2 IMPLANT
NS IRRIG 1000ML POUR BTL (IV SOLUTION) ×10 IMPLANT
PACK AORTA (CUSTOM PROCEDURE TRAY) ×2 IMPLANT
PAD ARMBOARD 7.5X6 YLW CONV (MISCELLANEOUS) ×4 IMPLANT
RETAINER VISCERA MED (MISCELLANEOUS) ×2 IMPLANT
SPECIMEN JAR MEDIUM (MISCELLANEOUS) ×2 IMPLANT
SPONGE LAP 18X18 X RAY DECT (DISPOSABLE) ×4 IMPLANT
SUT ETHIBOND 5 LR DA (SUTURE) IMPLANT
SUT PDS AB 1 TP1 54 (SUTURE) ×4 IMPLANT
SUT PROLENE 3 0 SH 48 (SUTURE) ×16 IMPLANT
SUT PROLENE 3 0 SH1 36 (SUTURE) ×2 IMPLANT
SUT PROLENE 5 0 C 1 24 (SUTURE) IMPLANT
SUT PROLENE 5 0 C 1 36 (SUTURE) ×6 IMPLANT
SUT SILK 2 0 TIES 17X18 (SUTURE) ×1
SUT SILK 2 0SH CR/8 30 (SUTURE) ×2 IMPLANT
SUT SILK 2-0 18XBRD TIE BLK (SUTURE) ×1 IMPLANT
SUT SILK 3 0 TIES 17X18 (SUTURE) ×1
SUT SILK 3-0 18XBRD TIE BLK (SUTURE) ×1 IMPLANT
SUT VIC AB 2-0 CT1 27 (SUTURE) ×5
SUT VIC AB 2-0 CT1 TAPERPNT 27 (SUTURE) ×5 IMPLANT
SUT VIC AB 3-0 SH 27 (SUTURE) ×4
SUT VIC AB 3-0 SH 27X BRD (SUTURE) ×4 IMPLANT
SUT VICRYL 4-0 PS2 18IN ABS (SUTURE) ×6 IMPLANT
TOWEL BLUE STERILE X RAY DET (MISCELLANEOUS) ×4 IMPLANT
TOWEL OR 17X24 6PK STRL BLUE (TOWEL DISPOSABLE) ×4 IMPLANT
TOWEL OR 17X26 10 PK STRL BLUE (TOWEL DISPOSABLE) ×4 IMPLANT
TRAY FOLEY CATH 14FRSI W/METER (CATHETERS) ×2 IMPLANT
WATER STERILE IRR 1000ML POUR (IV SOLUTION) ×2 IMPLANT

## 2013-01-16 NOTE — Progress Notes (Signed)
Utilization review completed.  P.J. Secilia Apps,RN,BSN Case Manager [phone]  

## 2013-01-16 NOTE — Preoperative (Signed)
Beta Blockers   Reason not to administer Beta Blockers:Not Applicable 

## 2013-01-16 NOTE — Consult Note (Signed)
PULMONARY  / CRITICAL CARE MEDICINE  Name: Ryan [name] MRN: [id number] DOB: 05-28-1966    ADMISSION DATE:  01/16/2013 CONSULTATION DATE:  01/16/2013  REFERRING MD :  Myra Gianotti, Vascular  CHIEF COMPLAINT:  Post op vent management  BRIEF PATIENT DESCRIPTION: 47 y/o male with CAD and aortic occlusion underwent an aortobifemoral bypass graft on 2/13 and PCCM was consulted for post op vent management.   SIGNIFICANT EVENTS / STUDIES:  2/13 aorto-bifem  LINES / TUBES: 2/13 R IJ introducer and SGC >> 2/13 R radial arterial line >>  CULTURES:   ANTIBIOTICS: 2/13 cefuroxime (peri-op) >>  HISTORY OF PRESENT ILLNESS:  47 y/o male with CAD and aortic occlusion underwent an aortobifemoral bypass graft on 2/13 and PCCM was consulted for post op vent management. He underwent PCI to his LAD in Jan 2013 for an STEMI and had been followed by vascular and cardiology for claudication symptoms due to aortic stenosis.  He also has subclavian stenosis.  He was able to stop his anti-platelet therapy in January 2014 so that he could undergo an elective aorto-bifem on 2/13.  He underwent the procedure without complication.  Hs aorta was cross clamped above the renal arteries for approximately 35 minutes.  In the OR his EBL was 1700, he received 6.5 L crystalloid, 1 L albumi, 360 mL of cell saver, and 2 U PRBC.     PAST MEDICAL HISTORY :  Past Medical History  Diagnosis Date  . MI (myocardial infarction)     AMI 1/13 - complicated by VT/Tosades  . Chronic back pain   . Anal fissure   . Rectal polyp   . Mixed hyperlipidemia   . PAD (peripheral artery disease)   . Hypertension     "patient states does not have primary doctor"  . Kidney stones   . GERD (gastroesophageal reflux disease)     "takes tums"  . Chronic low back pain     hx of  . Coronary atherosclerosis of native coronary artery     DES LAD 1/13, LVEF 35%, sees Dr. Sanjuana Kava  . Diabetes mellitus without complication     "borderline  diabetic"   Past Surgical History  Procedure Laterality Date  . Appendectomy    . Diagnostic laparoscopy    . Anal fissurectomy    . Coronary angioplasty      stent placed Dec 12, 2011   Prior to Admission medications   Medication Sig Start Date End Date Taking? Authorizing Provider  aspirin 81 MG tablet Take 162 mg by mouth daily.    Yes Historical Provider, MD  carvedilol (COREG) 6.25 MG tablet Take 1 tablet (6.25 mg total) by mouth 2 (two) times daily with a meal. 07/08/12  Yes Kathleene Hazel, MD  cyclobenzaprine (FLEXERIL) 10 MG tablet Take 1 tablet (10 mg total) by mouth 2 (two) times daily as needed for muscle spasms. 08/29/12  Yes Tiffany Irine Seal, PA  enalapril (VASOTEC) 5 MG tablet Take 1 tablet (5 mg total) by mouth 2 (two) times daily. 07/08/12  Yes Kathleene Hazel, MD  oxyCODONE-acetaminophen (ROXICET) 5-325 MG per tablet Take 1-2 tablets by mouth every 6 hours as needed for pain 12/26/12  Yes Kathleene Hazel, MD  simvastatin (ZOCOR) 40 MG tablet Take 1 tablet (40 mg total) by mouth every evening. 07/09/12  Yes Kathleene Hazel, MD  spironolactone (ALDACTONE) 25 MG tablet Take 0.5 tablets (12.5 mg total) by mouth daily. 07/08/12  Yes Kathleene Hazel, MD  nitroGLYCERIN (  NITROSTAT) 0.4 MG SL tablet Place 1 tablet (0.4 mg total) under the tongue every 5 (five) minutes as needed. For chest pain 01/14/13   Jodelle Gross, NP   Allergies  Allergen Reactions  . Hydrocodone Nausea And Vomiting    FAMILY HISTORY:  Family History  Problem Relation Age of Onset  . Cancer Father     Lung  . Cancer Maternal Uncle     Colon  . Diabetes Mother   . COPD Mother    SOCIAL HISTORY:  reports that he has been smoking Cigarettes.  He has a 25 pack-year smoking history. He has never used smokeless tobacco. He reports that  drinks alcohol. He reports that he does not use illicit drugs.  REVIEW OF SYSTEMS:  Cannot obtain due to intubation  SUBJECTIVE:   VITAL  SIGNS: Temp:  [98.4 F (36.9 C)-99.7 F (37.6 C)] 99.5 F (37.5 C) (02/13 1730) Pulse Rate:  [85-106] 86 (02/13 1730) Resp:  [20-29] 22 (02/13 1730) BP: (109)/(75) 109/75 mmHg (02/13 0555) SpO2:  [99 %-100 %] 99 % (02/13 1730) Arterial Line BP: (81-126)/(50-72) 83/51 mmHg (02/13 1730) FiO2 (%):  [50 %] 50 % (02/13 1615) Weight:  [100.2 kg (220 lb 14.4 oz)-100.245 kg (221 lb)] 100.2 kg (220 lb 14.4 oz) (02/13 1630) HEMODYNAMICS: PAP: (24-31)/(14-17) 31/17 mmHg CO:  [7.9 L/min] 7.9 L/min CI:  [3.7 L/min/m2] 3.7 L/min/m2 VENTILATOR SETTINGS: Vent Mode:  [-] PRVC FiO2 (%):  [50 %] 50 % Set Rate:  [12 bmp-16 bmp] 16 bmp Vt Set:  [650 mL] 650 mL PEEP:  [5 cmH20] 5 cmH20 Pressure Support:  [10 cmH20] 10 cmH20 Plateau Pressure:  [26 cmH20] 26 cmH20 INTAKE / OUTPUT: Intake/Output     02/13 0701 - 02/14 0700   I.V. (mL/kg) 7026 (70.1)   Blood 1060   NG/GT 30   IV Piggyback 1000   Total Intake(mL/kg) 9116 (91)   Urine (mL/kg/hr) 535 (0.4)   Blood 1700 (1.3)   Total Output 2235   Net +6881       Urine Occurrence 160 x     PHYSICAL EXAMINATION: Gen: chronically ill appearing, sedated on vent HEENT: NCAT, PERRL, EOMi, ETT in place PULM: rhonchi R base otherwise clear CV: RRR, distant heart sounds, no mgr, no JVD AB: BS infrequent, midline scar without redness or drainage Ext: warm, no edema, no clubbing, no cyanosis Derm: no rash or skin breakdown, see abdomen Neuro: arouses easily, nods head to question, follows commands bilaterally   LABS:  Recent Labs Lab 01/16/13 0922  01/16/13 1119 01/16/13 1505 01/16/13 1605 01/16/13 1618 01/16/13 1634  HGB 13.9  < > 13.3 13.6 14.2  --  14.3  WBC  --   --   --   --  25.0*  --   --   PLT  --   --   --   --  130*  --   --   NA 134*  < > 131* 139 137  --  136  K 4.9  < > 6.2* 4.7 5.7*  --  5.8*  CL  --   --   --   --  105  --  109  CO2  --   --   --   --  21  --   --   GLUCOSE  --   < >  --  269* 293*  --  290*  BUN  --   --    --   --  20  --  21  CREATININE  --   --   --   --  1.45*  --  1.50*  CALCIUM  --   --   --   --  6.6*  --   --   MG  --   --   --   --  1.4*  --   --   APTT  --   --   --   --  26  --   --   INR  --   --   --   --  1.15  --   --   PHART 7.328*  --  7.337*  --   --  7.247*  --   PCO2ART 48.9*  --  40.8  --   --  52.8*  --   PO2ART 349.0*  --  161.0*  --   --  94.0  --   < > = values in this interval not displayed.  Recent Labs Lab 01/16/13 0557 01/16/13 1739 01/16/13 1857  GLUCAP 253* 267* 274*    2/13 CXR: PA cath in place, lungs clear, poor inspiration 2/13 EKG : NSR, peaked T's in V2-3 compared to older studies but otherwise no change  ASSESSMENT / PLAN:  PULMONARY A: Post op respiratory failure > ABG OK on PRVC P:   -full vent support overnight -likely extubation 2/14 AM  CARDIOVASCULAR A:  1) Aortic stenosis >> s/p aorto-bifem 2/13; no post op complications 2) CAD >> s/p PCI (DES) to LAD 12/2011, no evidence of ischemia 3) Mild hypotension post op, likely sedation related and volume (CVP 10) 4) Subclavian stenosis 5) Tobacco abuse P:  -500cc saline bolus now -levophed as needed for MAP > 65 -hold metoprolol while hypotensive, restart ASAP -otherwise per Cardiology/Vascular -counsel to quit smoking   RENAL A:  35 minutes of renal ischemic time during surgery, currently making good urine P:   -monitor UOP -BMET in AM  GASTROINTESTINAL A:  No acute issues P:   -npo overnight 2/13  HEMATOLOGIC A:  EBL 1700cc in OR, post op Hgb 14 P:  -monitor for bleeding -f/u AM cbc 2/14  INFECTIOUS A:  No active issues P:     ENDOCRINE A:  DM2 (was labeled as glucose intolerance)   P:   -add Hgb A1c 2/14 AM labs  NEUROLOGIC A:  Post op pain and vent synchrony P:   -precedex overnight -fentanyl as needed for pain  TODAY'S SUMMARY:   I have personally obtained a history, examined the patient, evaluated laboratory and imaging results, formulated the  assessment and plan and placed orders. CRITICAL CARE: The patient is critically ill with multiple organ systems failure and requires high complexity decision making for assessment and support, frequent evaluation and titration of therapies, application of advanced monitoring technologies and extensive interpretation of multiple databases. Critical Care Time devoted to patient care services described in this note is 45 minutes.   Fonnie Jarvis Pulmonary and Critical Care Medicine [facility] Pager: [phone]  01/16/2013, 7:40 PM

## 2013-01-16 NOTE — Op Note (Signed)
[facility] [place]  Patient name: Ryan [name] MRN: [id number] DOB: [birth year] Sex: male  01/16/2013 Pre-operative Diagnosis: bilateral claudication, aortic occlusion Post-operative diagnosis:  Same Surgeon:  Jorge Ny Assistants:  Toni Amend Procedure:   Aortobifemoral bypass graft with 14 x 7 dacryon Anesthesia:  Gen. Blood Loss:  See anesthesia record Specimens:  Aortic plaque  Findings:  Aortic occlusion and extended up to the top of the renal arteries, requiring suprarenal clamping. Renal ischemia was approximately 35 minutes  Indications:  The patient presented with an acute MI one year ago. A drug-eluting stent was placed. During that setting he was found to have aortic occlusion. He was not allowed to come off of his antiplatelet therapy for one year. He is now eclipsed that time. He comes in for his operation.preoperative CT scan shows aortic occlusion up to the level of the renal arteries.  Procedure:  The patient was identified in the holding area and taken to [facility] OR ROOM 11  The patient was then placed supine on the table. general anesthesia was administered.  The patient was prepped and draped in the usual sterile fashion.  A time out was called and antibiotics were administered.  I initially began by making a longitudinal femoral incisions. Cautery was used to divide the subcutaneous tissue down to the femoral sheath bilaterally. The femoral sheaths were opened bilaterally. The common femoral artery was exposed from the inguinal ligament down to the bifurcation. The profunda and superficial femoral arteries were each individually isolated. There was very minimal plaque within each femoral vessel. The crossing iliac veins were divided on each side under the inguinal ligament. Gauze was then placed back into each groin and attention was turned towards the abdomen.  A midline abdominal incision was made from the xiphoid down to below the  umbilicus. The subcutaneous tissue was divided down to the fascia which was then opened. The abdomen was entered sharply. The incision was then opened with cautery throughout it's length. There was an adhesion from the omentum down to the sigmoid colon which was taken down sharply. The small bowel was rather distended. There was no transition point to reveal etiology. The distended bowel made it very difficult to get adequate exposure. A Balfour was placed. A Omni track was then put into position. The transverse colon was reflected cephalad. The small bowel was mobilized to the patient's right. Had a very difficult time packing the small intestine out of the way. The ligament of Treitz was taken down sharply. The patient had a significant amount of periaortic fat which was divided with cautery. Ultimately I was able to identify the left renal vein. I did have to divide the gonadal and lumbar vein to reflect this left renal vein cephalad. The inferior mesenteric vein was not divided. There was a significant amount of inflammatory tissue around the aorta. I was able to obtain circumferential infrarenal aortic control. I then exposed the suprarenal aorta. Next, I created a tunnel between the aorta and each groin and passed the umbilical tape. 25 mg of mannitol and full heparinization was administered. Once the heparin had circulated, I placed a Zanger clamp on the suprarenal aorta. I transected the infrarenal aorta just above the inferior mesenteric artery. The artery was occluded. I removed the thrombus within the aorta. Once I was able to evacuate all of the laminated thrombus, I moved a aortic DeBakey clamp to below the renal arteries. I then oversewed the aortic stump  with 3-0 Prolene using a felt strip. This was done in 2 layers. Next, a 16 x 8 bifurcated dacryon graft was selected. The aortic clamp was difficult to get in the appropriate position making the posterior wall very difficult to visualize. I elected to  proceed. I placed 3 interrupted posterior back wall sutures. These were done in a horizontal mattress fashion using a felt strip. The lateral 2 sutures were run around the aorta and the graft, incorporating a felt strip. Once the anastomosis was completed the aortic clamp was removed. The posterior wall was not well incorporated and I could not repair it with pledgeted sutures. I therefore had Dr. Hart Rochester to assist. We obtained better exposure by extending the midline incision further inferiorly. I then replaced the suprarenal clamp. Both renal arteries were occluded. The previous anastomosis was taken down. This time with better exposure the aortic posterior wall was better visualized. A 14 x 7 graft was selected. The proximal anastomosis was sewn with a 3-0 Prolene and incorporating a felt strip. Total renal ischemic time was approximately 35 minutes. The proximal clamp was released. The anastomosis was hemostatic. Each limb of the graft was then brought through the respective groin. The graft was then flushed. Dr. Hart Rochester performed the right femoral anastomosis, and I performed a left. These were done bifemoral artery occlusion, proximally and distally. An arteriotomy was made bilaterally with a #11 blade in the distal common femoral artery. The graft limbs were then cut to the appropriate length. The distal anastomosis was sewn with 5-0 Prolene in a running fashion. Prior to completion the appropriate flushing maneuvers were performed. Sequential return of blood flow was performed to each leg. Hand-held Doppler was used to evaluate the signal in the femoral vessels. At multiphasic signals. I also confirmed that there was appropriate signals in each renal artery.  At this 0.50 mg of protamine was administered. Once I was satisfied with hemostasis, the retroperitoneum was reapproximated with a running 2-0 Vicryl. I then reinspected the entire small bowel. The entire bowel was without defect. It was somewhat  difficult placing the small intestines back into the abdomen however this was able to be done. I closed the abdomen with 2 running #1 PDS. There was minimal tension on the closure.the subcutaneous tissue was then closed with a layer of 20 and a layer of 3-0 Vicryl. Skin was closed with 4-0 Vicryl. Next, attention was turned to each groin. Once the groins were hemostatic, the femoral sheath was reapproximated with 2-0 Vicryl. The subcutaneous tissue was then reapproximated with multiple layers of 20 and 3-0 Vicryl. The skin was closed with 4-0 Vicryl. Dermabond placed on all incisions. The patient had brisk multiphasic posterior tibial artery signals bilaterally. Because of the duration of the operation it was felt that the patient should remain intubated and be taken to the intensive care unit for control extubation. He remained hemodynamically stable.   Disposition:  To PACU in stable condition.   Juleen China, M.D. [facility] [place] Office: [phone] Pager:  [phone]

## 2013-01-16 NOTE — Transfer of Care (Signed)
Immediate Anesthesia Transfer of Care Note  Patient: Ryan [name]  Procedure(s) Performed: Procedure(s): AORTA BIFEMORAL BYPASS GRAFT (N/A)  Patient Location: ICU  Anesthesia Type:General  Level of Consciousness: sedated and Patient remains intubated per anesthesia plan  Airway & Oxygen Therapy: Patient remains intubated per anesthesia plan and Patient placed on Ventilator (see vital sign flow sheet for setting)  Post-op Assessment: Report given to PACU RN  Post vital signs: Reviewed and stable  Complications: No apparent anesthesia complications

## 2013-01-16 NOTE — Progress Notes (Signed)
Dr. Marin Shutter notified of continued SBP in 80-90's, PAD 15. CI >2.0, CHEM 8 results and G3 results. Orders received and insulin drip ordered.

## 2013-01-16 NOTE — H&P (Signed)
[facility] [place]     Patient name: Ryan [name]            MRN: [id number]        DOB: 12-Jan-1966        Sex: male      Chief Complaint   Patient presents with   .  PVD       4 month f/u  NO CTA due to medicaid        HISTORY OF PRESENT ILLNESS: The patient is back today for followup. I initially met him in March of 2013. He suffered a MI in January 2013, and at that time he had a drug-eluting stent placed to his LAD. During this process, an abdominal aortogram revealed aortic occlusion at the level of the renal arteries. He also has a left subclavian artery occlusion. Because of his drug-eluting stent, I have not been able to proceed with surgical revascularization, because he has not been able to come off of his antiplatelet medication. He most recently saw Dr. Sanjuana Kava who stopped his Effient.  The patient reports left arm numbness and weakness with activity. He does not have any neurologic symptoms. He suffers left greater than right leg claudication at 20-30 feet. He denies nonhealing wounds or rest pain.   He continues to be medically managed for his hypertension. He is on a statin for his hypercholesterolemia. He continues to smoke but has cut back to 6 cigarettes per day.      Past Medical History   Diagnosis  Date   .  Coronary atherosclerosis of native coronary artery         DES LAD 1/13, LVEF 35%   .  MI (myocardial infarction)         AMI 1/13 - complicated by VT/Tosades   .  Chronic back pain     .  Anal fissure     .  Rectal polyp     .  Mixed hyperlipidemia     .  PAD (peripheral artery disease)           Past Surgical History   Procedure  Date   .  Appendectomy     .  Diagnostic laparoscopy     .  Anal fissurectomy           History       Social History   .  Marital Status:  Divorced       Spouse Name:  N/A       Number of Children:  N/A   .  Years of Education:  N/A       Occupational History   .    Other        Social History Main Topics   .  Smoking status:  Current Every Day Smoker -- 1.0 packs/day for 25 years       Types:  Cigarettes   .  Smokeless tobacco:  Never Used   .  Alcohol Use:  0.0 oz/week         Comment: Weekends   .  Drug Use:  No   .  Sexually Active:  Not on file       Other Topics  Concern   .  Not on file       Social History Narrative     [employer] - physical labor  - works night shift         Family History  Problem  Relation  Age of Onset   .  Cancer  Father         Lung   .  Cancer  Maternal Uncle         Colon   .  Diabetes  Mother     .  COPD  Mother           Allergies as of 12/16/2012 - Review Complete 12/16/2012   Allergen  Reaction  Noted   .  Hydrocodone  Nausea And Vomiting  08/29/2012         Current Outpatient Prescriptions on File Prior to Visit   Medication  Sig  Dispense  Refill   .  aspirin 81 MG tablet  Take 162 mg by mouth daily.          .  carvedilol (COREG) 6.25 MG tablet  Take 1 tablet (6.25 mg total) by mouth 2 (two) times daily with a meal.   60 tablet   6   .  cyclobenzaprine (FLEXERIL) 10 MG tablet  Take 1 tablet (10 mg total) by mouth 2 (two) times daily as needed for muscle spasms.   20 tablet   0   .  enalapril (VASOTEC) 5 MG tablet  Take 1 tablet (5 mg total) by mouth 2 (two) times daily.   60 tablet   6   .  nitroGLYCERIN (NITROSTAT) 0.4 MG SL tablet  Place 0.4 mg under the tongue every 5 (five) minutes as needed. For chest pain         .  oxyCODONE-acetaminophen (ROXICET) 5-325 MG per tablet  Take 1-2 tablets by mouth every 6 hours as needed for pain   180 tablet   0   .  simvastatin (ZOCOR) 40 MG tablet  Take 1 tablet (40 mg total) by mouth every evening.   30 tablet   5   .  spironolactone (ALDACTONE) 25 MG tablet  Take 0.5 tablets (12.5 mg total) by mouth daily.   30 tablet   6   .  prasugrel (EFFIENT) 10 MG TABS  Take 1 tablet (10 mg total) by mouth daily.   90 tablet   2           REVIEW OF SYSTEMS: Cardiovascular: No chest pain, chest pressure, palpitations. Positive for pain in legs when walking Pulmonary: No productive cough, asthma or wheezing. Neurologic: Positive for numbness and weakness in his left arm with activity. No dizziness. Hematologic: No bleeding problems or clotting disorders. Musculoskeletal: No joint pain or joint swelling. Gastrointestinal: No blood in stool or hematemesis Genitourinary: No dysuria or hematuria. Psychiatric:: No history of major depression. Integumentary: No rashes or ulcers. Constitutional: No fever or chills.   PHYSICAL EXAMINATION:   Vital signs are BP 133/83  Pulse 45  Ht 5\' 9"  (1.753 m)  Wt 212 lb (96.163 kg)  BMI 31.31 kg/m2  SpO2 98% General: The patient appears their stated age. HEENT:  No gross abnormalities Pulmonary:  Non labored breathing Abdomen: Soft and non-tender right lower quadrant appendectomy incision Musculoskeletal: There are no major deformities. Neurologic: No focal weakness or paresthesias are detected, Skin: There are no ulcer or rashes noted. Psychiatric: The patient has normal affect. Cardiovascular: There is a regular rate and rhythm without significant murmur appreciated. No carotid bruits. Femoral pulses are not palpable     Diagnostic Studies I have reviewed his old carotid Dopplers which show minimal carotid disease. He has asymmetric brachial pressures Duplex was obtained today which  shows an ABI of 0.65 on the right and 0.62 on the left femoral-popliteal disease was not identified.   Assessment: #1 aortic occlusion #2 left subclavian occlusion Plan: #1: The patient is eager to proceed with aortobifemoral bypass graft. I have described the operation in detail. He understands the risks and benefits which include but are not limited to death, cardiopulmonary complications, pneumonia, wound complications, hernia, intestinal problems. I have scheduled his operation for  Thursday, February 13. He has been cleared from a cardiology standpoint for this operation. I am going to order a CT angiogram of the abdomen and pelvis with runoff for preoperative planning   #2: I will address his left subclavian occlusion once he has recovered from his aortobifemoral bypass graft. He is without neurologic symptoms from this currently   V. Charlena Cross, M.D. [facility] [place] Office: [phone] Pager:  [phone]

## 2013-01-17 LAB — COMPREHENSIVE METABOLIC PANEL
Albumin: 2.7 g/dL — ABNORMAL LOW (ref 3.5–5.2)
BUN: 21 mg/dL (ref 6–23)
CO2: 20 mEq/L (ref 19–32)
Calcium: 6.7 mg/dL — ABNORMAL LOW (ref 8.4–10.5)
Chloride: 110 mEq/L (ref 96–112)
Creatinine, Ser: 1.59 mg/dL — ABNORMAL HIGH (ref 0.50–1.35)
GFR calc non Af Amer: 51 mL/min — ABNORMAL LOW (ref >90)
Total Bilirubin: 0.7 mg/dL (ref 0.3–1.2)

## 2013-01-17 LAB — TYPE AND SCREEN
ABO/RH(D): O POS
Antibody Screen: NEGATIVE
Unit division: 0
Unit division: 0
Unit division: 0

## 2013-01-17 LAB — POCT I-STAT 3, ART BLOOD GAS (G3+)
Acid-base deficit: 5 mmol/L — ABNORMAL HIGH (ref 0.0–2.0)
TCO2: 21 mmol/L (ref 0–100)
pCO2 arterial: 33.5 mmHg — ABNORMAL LOW (ref 35.0–45.0)
pCO2 arterial: 33.1 mmHg — ABNORMAL LOW (ref 35.0–45.0)
pH, Arterial: 7.372 (ref 7.350–7.450)
pH, Arterial: 7.399 (ref 7.350–7.450)
pO2, Arterial: 100 mmHg (ref 80.0–100.0)

## 2013-01-17 LAB — CBC
HCT: 37 % — ABNORMAL LOW (ref 39.0–52.0)
MCH: 31.5 pg (ref 26.0–34.0)
MCV: 92.5 fL (ref 78.0–100.0)
Platelets: 109 10*3/uL — ABNORMAL LOW (ref 150–400)
RDW: 13.6 % (ref 11.5–15.5)
WBC: 19 10*3/uL — ABNORMAL HIGH (ref 4.0–10.5)

## 2013-01-17 LAB — GLUCOSE, CAPILLARY
Glucose-Capillary: 186 mg/dL — ABNORMAL HIGH (ref 70–99)
Glucose-Capillary: 170 mg/dL — ABNORMAL HIGH (ref 70–99)
Glucose-Capillary: 141 mg/dL — ABNORMAL HIGH (ref 70–99)
Glucose-Capillary: 128 mg/dL — ABNORMAL HIGH (ref 70–99)
Glucose-Capillary: 122 mg/dL — ABNORMAL HIGH (ref 70–99)
Glucose-Capillary: 148 mg/dL — ABNORMAL HIGH (ref 70–99)
Glucose-Capillary: 162 mg/dL — ABNORMAL HIGH (ref 70–99)

## 2013-01-17 LAB — AMYLASE: Amylase: 42 U/L (ref 0–105)

## 2013-01-17 LAB — MAGNESIUM: Magnesium: 1.3 mg/dL — ABNORMAL LOW (ref 1.5–2.5)

## 2013-01-17 MED ORDER — NALOXONE HCL 0.4 MG/ML IJ SOLN: 0.4000 mg | INTRAMUSCULAR | Status: DC | PRN

## 2013-01-17 MED ORDER — MIDAZOLAM HCL 2 MG/2ML IJ SOLN
INTRAMUSCULAR | Status: AC
  Administered 2013-01-17: 2 mg
  Filled 2013-01-17: qty 2

## 2013-01-17 MED ORDER — BACITRACIN-NEOMYCIN-POLYMYXIN OINTMENT TUBE
TOPICAL_OINTMENT | Freq: Three times a day (TID) | CUTANEOUS | Status: DC
  Administered 2013-01-17 – 2013-01-21 (×15): via TOPICAL
  Filled 2013-01-17 (×3): qty 15

## 2013-01-17 MED ORDER — DEXMEDETOMIDINE HCL IN NACL 400 MCG/100ML IV SOLN
0.4000 ug/kg/h | INTRAVENOUS | Status: DC
  Administered 2013-01-17: 0.7 ug/kg/h via INTRAVENOUS
  Filled 2013-01-17 (×2): qty 100

## 2013-01-17 MED ORDER — MIDAZOLAM HCL 5 MG/ML IJ SOLN: 2.0000 mg | INTRAMUSCULAR | Status: DC | PRN

## 2013-01-17 MED ORDER — SODIUM CHLORIDE 0.9 % IJ SOLN: 9.0000 mL | INTRAMUSCULAR | Status: DC | PRN

## 2013-01-17 MED ORDER — DIPHENHYDRAMINE HCL 12.5 MG/5ML PO ELIX
12.5000 mg | ORAL_SOLUTION | Freq: Four times a day (QID) | ORAL | Status: DC | PRN
  Filled 2013-01-17: qty 5

## 2013-01-17 MED ORDER — DIPHENHYDRAMINE HCL 50 MG/ML IJ SOLN: 12.5000 mg | Freq: Four times a day (QID) | INTRAMUSCULAR | Status: DC | PRN

## 2013-01-17 MED ORDER — LORAZEPAM 2 MG/ML IJ SOLN
0.5000 mg | Freq: Once | INTRAMUSCULAR | Status: AC
  Administered 2013-01-17: 0.5 mg via INTRAVENOUS
  Filled 2013-01-17: qty 1

## 2013-01-17 MED ORDER — FENTANYL 10 MCG/ML IV SOLN
INTRAVENOUS | Status: DC
  Administered 2013-01-17: 147.5 ug via INTRAVENOUS
  Administered 2013-01-17: 09:00:00 via INTRAVENOUS
  Administered 2013-01-17: 265 ug via INTRAVENOUS
  Administered 2013-01-17: via INTRAVENOUS
  Administered 2013-01-17: 330 ug via INTRAVENOUS
  Administered 2013-01-17: 240 ug via INTRAVENOUS
  Administered 2013-01-18: 225 ug via INTRAVENOUS
  Administered 2013-01-18: 540 ug via INTRAVENOUS
  Administered 2013-01-18: 300 ug/h via INTRAVENOUS
  Administered 2013-01-18: 15:00:00 via INTRAVENOUS
  Administered 2013-01-18: 210 ug via INTRAVENOUS
  Administered 2013-01-18: 21:00:00 via INTRAVENOUS
  Administered 2013-01-18: 240 ug via INTRAVENOUS
  Administered 2013-01-19: 13:00:00 via INTRAVENOUS
  Administered 2013-01-19: 233.7 ug via INTRAVENOUS
  Administered 2013-01-19: 240 ug via INTRAVENOUS
  Administered 2013-01-19: 05:00:00 via INTRAVENOUS
  Administered 2013-01-19: 304 ug via INTRAVENOUS
  Administered 2013-01-19: 279 ug via INTRAVENOUS
  Administered 2013-01-20: 225 ug via INTRAVENOUS
  Administered 2013-01-20: 13:00:00 via INTRAVENOUS
  Administered 2013-01-20: 195 ug via INTRAVENOUS
  Administered 2013-01-21: 02:00:00 via INTRAVENOUS
  Filled 2013-01-17 (×14): qty 50

## 2013-01-17 NOTE — Progress Notes (Signed)
PULMONARY  / CRITICAL CARE MEDICINE  Name: Ryan [name] MRN: [id number] DOB: 1966-01-04    ADMISSION DATE:  01/16/2013 CONSULTATION DATE:  01/16/2013  REFERRING MD :  Myra Gianotti, Vascular  CHIEF COMPLAINT:  Post op vent management  BRIEF PATIENT DESCRIPTION: 47 y/o male with CAD and aortic occlusion underwent an aortobifemoral bypass graft on 2/13 and PCCM was consulted for post op vent management.   SIGNIFICANT EVENTS / STUDIES:  2/13 aorto-bifem  LINES / TUBES: 2/13 R IJ introducer and SGC >> 2/13 R radial arterial line >>  CULTURES:  ANTIBIOTICS: 2/13 cefuroxime (peri-op) >>  HISTORY OF PRESENT ILLNESS:  47 y/o male with CAD and aortic occlusion underwent an aortobifemoral bypass graft on 2/13 and PCCM was consulted for post op vent management. He underwent PCI to his LAD in Jan 2013 for an STEMI and had been followed by vascular and cardiology for claudication symptoms due to aortic stenosis.  He also has subclavian stenosis.  He was able to stop his anti-platelet therapy in January 2014 so that he could undergo an elective aorto-bifem on 2/13.  He underwent the procedure without complication.  Hs aorta was cross clamped above the renal arteries for approximately 35 minutes.  In the OR his EBL was 1700, he received 6.5 L crystalloid, 1 L albumi, 360 mL of cell saver, and 2 U PRBC.    SUBJECTIVE:  Extubated successfully 2/14 am  VITAL SIGNS: Temp:  [99.1 F (37.3 C)-100.8 F (38.2 C)] 99.9 F (37.7 C) (02/14 0900) Pulse Rate:  [80-106] 85 (02/14 0900) Resp:  [16-32] 23 (02/14 0900) BP: (88-154)/(51-77) 115/70 mmHg (02/14 0900) SpO2:  [96 %-100 %] 99 % (02/14 0900) Arterial Line BP: (81-153)/(50-111) 129/62 mmHg (02/14 0900) FiO2 (%):  [50 %] 50 % (02/14 0005) Weight:  [100.2 kg (220 lb 14.4 oz)-100.245 kg (221 lb)] 100.2 kg (220 lb 14.4 oz) (02/13 1630) HEMODYNAMICS: PAP: (24-37)/(14-24) 33/23 mmHg CVP:  [10 mmHg-13 mmHg] 12 mmHg CO:  [7.9 L/min] 7.9 L/min CI:  [3.7  L/min/m2] 3.7 L/min/m2 VENTILATOR SETTINGS: Vent Mode:  [-] CPAP FiO2 (%):  [50 %] 50 % Set Rate:  [12 bmp-16 bmp] 16 bmp Vt Set:  [650 mL] 650 mL PEEP:  [5 cmH20] 5 cmH20 Pressure Support:  [10 cmH20] 10 cmH20 Plateau Pressure:  [11 cmH20-26 cmH20] 11 cmH20 INTAKE / OUTPUT: Intake/Output     02/13 0701 - 02/14 0700 02/14 0701 - 02/15 0700   I.V. (mL/kg) 8362.1 (83.5) 215.1 (2.1)   Blood 1060    NG/GT 30    IV Piggyback 1050    Total Intake(mL/kg) 10502.1 (104.8) 215.1 (2.1)   Urine (mL/kg/hr) 1345 (0.6) 125 (0.4)   Blood 1700 (0.7)    Total Output 3045 125   Net +7457.1 +90.1        Urine Occurrence 325 x      PHYSICAL EXAMINATION: Gen: chronically ill appearing, awake in bed HEENT: NCAT, PERRL, EOMi, PULM: rhonchi R base otherwise clear CV: RRR, distant heart sounds, no mgr, no JVD AB: BS infrequent, midline scar without redness or drainage Ext: warm, no edema, no clubbing, no cyanosis Derm: no rash or skin breakdown, see abdomen Neuro: awake and oriented, follows commands bilaterally   LABS:  Recent Labs Lab 01/16/13 1605  01/16/13 1634 01/16/13 1949 01/17/13 0404 01/17/13 0410 01/17/13 0558  HGB 14.2  --  14.3  --   --  12.6*  --   WBC 25.0*  --   --   --   --  19.0*  --   PLT 130*  --   --   --   --  109*  --   NA 137  --  136  --   --  141  --   K 5.7*  --  5.8*  --   --  3.6  --   CL 105  --  109  --   --  110  --   CO2 21  --   --   --   --  20  --   GLUCOSE 293*  --  290*  --   --  140*  --   BUN 20  --  21  --   --  21  --   CREATININE 1.45*  --  1.50*  --   --  1.59*  --   CALCIUM 6.6*  --   --   --   --  6.7*  --   MG 1.4*  --   --   --   --  1.3*  --   AST  --   --   --   --   --  67*  --   ALT  --   --   --   --   --  57*  --   ALKPHOS  --   --   --   --   --  36*  --   BILITOT  --   --   --   --   --  0.7  --   PROT  --   --   --   --   --  4.8*  --   ALBUMIN  --   --   --   --   --  2.7*  --   APTT 26  --   --   --   --   --   --   INR  1.15  --   --   --   --   --   --   PHART  --   < >  --  7.328* 7.372  --  7.399  PCO2ART  --   < >  --  36.3 33.5*  --  33.1*  PO2ART  --   < >  --  120.0* 100.0  --  107.0*  < > = values in this interval not displayed.  Recent Labs Lab 01/16/13 0557 01/16/13 1739 01/16/13 1857  GLUCAP 253* 267* 274*    2/13 CXR: PA cath in place, lungs clear, poor inspiration 2/13 EKG : NSR, peaked T's in V2-3 compared to older studies but otherwise no change  ASSESSMENT / PLAN:  PULMONARY A: Post op respiratory failure > ABG OK on PRVC P:   -extubated successfully 2/14  CARDIOVASCULAR A:  1) Aortic stenosis >> s/p aorto-bifem 2/13; no post op complications 2) CAD >> s/p PCI (DES) to LAD 12/2011, no evidence of ischemia 3) Mild hypotension post op, likely sedation related and volume (CVP 10) 4) Subclavian stenosis 5) Tobacco abuse P:  -norepi weaned to off 2/14 am -hold metoprolol while hypotensive, restart ASAP -otherwise per Cardiology/Vascular -counsel to quit smoking   RENAL A:  35 minutes of renal ischemic time during surgery, currently making good urine, renal insufficiency last S Cr 1.59 P:   -monitor UOP -follow BMP  GASTROINTESTINAL A:  No acute issues P:   -npo overnight 2/13  HEMATOLOGIC A:  EBL 1700cc in OR, post op Hgb 14  P:  -monitor for bleeding -f/u CBC  INFECTIOUS A:  No active issues P:     ENDOCRINE A:  DM2 (was labeled as glucose intolerance)   P:   -Hgb A1c 2/14   NEUROLOGIC A:  Post op pain and vent synchrony, now off sedation P:   -fentanyl as needed for pain  TODAY'S SUMMARY:   I have personally obtained a history, examined the patient, evaluated laboratory and imaging results, formulated the assessment and plan and placed orders.  PCCM will sign off, please call if we can help  Levy Pupa, MD, PhD 01/17/2013, 10:18 AM [facility] [phone] or if no answer [phone]

## 2013-01-17 NOTE — Progress Notes (Signed)
OT Cancellation Note  Patient Details Name: Ryan [name] MRN: [id number] DOB: Oct 31, 1966   Cancelled Treatment:    Reason Eval/Treat Not Completed: Patient not medically ready (s/p surg aortobifemoral bypass graft today). Ot to evaluate patient at more appropriate date/ time. Ot appreciates order  Lucile Shutters Pager: [phone]  01/17/2013, 3:43 PM

## 2013-01-17 NOTE — Procedures (Signed)
Extubation Procedure Note  Patient Details:   Name: Ryan [name] DOB: 1966/03/31 MRN: [id number]   Airway Documentation:     Evaluation  O2 sats: stable throughout Complications: No apparent complications Patient did tolerate procedure well. Bilateral Breath Sounds: Diminished Suctioning: Airway Yes  Order to extubate, pt positive for cuff leak. Pt extubated to 4lpm Flushing, no strider or dyspnea noted after extubation. Pt resting more comfortably at this time, vitals are within normal limits. RT will continue to monitor.   Beatris Si 01/17/2013, 8:01 AM

## 2013-01-17 NOTE — Progress Notes (Signed)
    SUBJECTIVE: No chest pain this am. Extubated this am.   BP 115/70  Pulse 85  Temp(Src) 99.9 F (37.7 C) (Oral)  Resp 23  Ht 5\' 8"  (1.727 m)  Wt 220 lb 14.4 oz (100.2 kg)  BMI 33.6 kg/m2  SpO2 99%  Intake/Output Summary (Last 24 hours) at 01/17/13 1029 Last data filed at 01/17/13 0900  Gross per 24 hour  Intake 7217.2 ml  Output   2895 ml  Net 4322.2 ml    PHYSICAL EXAM General: Well developed, well nourished, in no acute distress. Alert and oriented x 3.  Psych:  Good affect, responds appropriately Neck: No JVD. No masses noted.  Lungs: Clear bilaterally with no wheezes or rhonci noted.  Heart: RRR with no murmurs noted. Extremities: No lower extremity edema.   LABS: Basic Metabolic Panel:  Recent Labs  16/10/96 1605 01/16/13 1634 01/17/13 0410  NA 137 136 141  K 5.7* 5.8* 3.6  CL 105 109 110  CO2 21  --  20  GLUCOSE 293* 290* 140*  BUN 20 21 21   CREATININE 1.45* 1.50* 1.59*  CALCIUM 6.6*  --  6.7*  MG 1.4*  --  1.3*   CBC:  Recent Labs  01/16/13 1605 01/16/13 1634 01/17/13 0410  WBC 25.0*  --  19.0*  HGB 14.2 14.3 12.6*  HCT 41.1 42.0 37.0*  MCV 93.8  --  92.5  PLT 130*  --  109*   Cardiac Enzymes: No results found for this basename: CKTOTAL, CKMB, CKMBINDEX, TROPONINI,  in the last 72 hours Fasting Lipid Panel: No results found for this basename: CHOL, HDL, LDLCALC, TRIG, CHOLHDL, LDLDIRECT,  in the last 72 hours  Current Meds: . cefUROXime (ZINACEF)  IV  1.5 g Intravenous Q12H  . fentaNYL   Intravenous Q4H  . metoprolol  2.5 mg Intravenous Q6H  . neomycin-bacitracin-polymyxin   Topical TID  . pantoprazole (PROTONIX) IV  40 mg Intravenous QHS     ASSESSMENT AND PLAN: Pt is s/p aortobifemoral bypass graft yesterday per Dr. Myra Gianotti. His CAD is followed in our office. He is stable this am post procedure. Anti-platelet therapy is on hold post procedure. Mild renal dysfunction post op. Follow renal function closely. Would resume cardiac  medications as tolerated.   Mairely Foxworth  2/14/201410:29 AM   .

## 2013-01-17 NOTE — Evaluation (Signed)
Physical Therapy Evaluation Patient Details Name: DREXEL IVEY MRN: [id number] DOB: 1966/06/06 Today's Date: 01/17/2013 Time: 4098-1191 PT Time Calculation (min): 36 min  PT Assessment / Plan / Recommendation Clinical Impression  Pt s/p aortobifemoral BPG and VDRF with decr mobility secondary to decr endurance, decr strength and decr balance.  Will benefit from PT to address endurance and balance.  May need a RW and HHPT f/u.      PT Assessment  Patient needs continued PT services    Follow Up Recommendations  Home health PT;Supervision/Assistance - 24 hour                Equipment Recommendations  Rolling walker with 5" wheels         Frequency Min 3X/week    Precautions / Restrictions Precautions Precautions: Fall Restrictions Weight Bearing Restrictions: No   Pertinent Vitals/Pain Ambulated on 4LO2 to keep sats >90%.  Some pain      Mobility  Bed Mobility Bed Mobility: Not assessed Transfers Transfers: Sit to Stand;Stand to Sit Sit to Stand: 1: +2 Total assist;With upper extremity assist;With armrests;From chair/3-in-1 Sit to Stand: Patient Percentage: 60% Stand to Sit: 1: +2 Total assist;With upper extremity assist;With armrests;To chair/3-in-1 Stand to Sit: Patient Percentage: 60% Details for Transfer Assistance: Pt needed cues for hand placement and mod assist to assist leaning forward secondary to abdominal pain.  Needed incr assist for sit to stand as well secondary to assist needed for anterior lean onto anterior feet to stand up. Ambulation/Gait Ambulation/Gait Assistance: 1: +2 Total assist (second person followed with chair for safety) Ambulation/Gait: Patient Percentage: 70% Ambulation Distance (Feet): 180 Feet Assistive device: Rolling walker Ambulation/Gait Assistance Details: Pt ambulated with RW with cues for upright posture as well as steadying assist at times for postural stability.  Pt with fatigue as he ambulated needing cues at times for  pursed lip breathing and standing rest breaks occasionally.  Noted pt also widened his BOS for stability.  Pt initially taking smaller steps but steps got better as he progressed distance.   Gait Pattern: Step-to pattern;Decreased stride length;Trunk flexed;Wide base of support Gait velocity: decreased Stairs: No Wheelchair Mobility Wheelchair Mobility: No         PT Diagnosis: Generalized weakness  PT Problem List: Decreased balance;Decreased mobility;Decreased activity tolerance;Decreased strength;Decreased safety awareness;Decreased knowledge of precautions;Decreased knowledge of use of DME;Pain PT Treatment Interventions: DME instruction;Gait training;Functional mobility training;Stair training;Therapeutic activities;Therapeutic exercise   PT Goals Acute Rehab PT Goals PT Goal Formulation: With patient Time For Goal Achievement: 01/24/13 Potential to Achieve Goals: Good Pt will go Supine/Side to Sit: Independently PT Goal: Supine/Side to Sit - Progress: Goal set today Pt will go Sit to Supine/Side: Independently PT Goal: Sit to Supine/Side - Progress: Goal set today Pt will go Sit to Stand: Independently PT Goal: Sit to Stand - Progress: Goal set today Pt will Ambulate: 51 - 150 feet;with least restrictive assistive device;with modified independence PT Goal: Ambulate - Progress: Goal set today Pt will Go Up / Down Stairs: 3-5 stairs;with modified independence;with least restrictive assistive device PT Goal: Up/Down Stairs - Progress: Goal set today  Visit Information  Last PT Received On: 01/17/13 Assistance Needed: +2 (lines)    Subjective Data  Subjective: "Have you ever been cut like this?" Patient Stated Goal: To go home   Prior Functioning  Home Living Lives With: Significant other Available Help at Discharge: Family;Friend(s);Available PRN/intermittently;Other (Comment) (girlfriend and family work) Type of Home: House Home Access: Stairs to enter Entrance  Stairs-Number of Steps: 3 Entrance Stairs-Rails: Can reach both Home Layout: One level Bathroom Shower/Tub: Engineer, manufacturing systems: Standard Home Adaptive Equipment: None Additional Comments: Family states they might be able to borrow a RW and cane. Prior Function Level of Independence: Independent Able to Take Stairs?: Yes Driving: No Vocation: On disability Communication Communication: No difficulties    Cognition  Cognition Overall Cognitive Status: Appears within functional limits for tasks assessed/performed Arousal/Alertness: Awake/alert Orientation Level: Appears intact for tasks assessed Behavior During Session: [facility] for tasks performed    Extremity/Trunk Assessment Right Upper Extremity Assessment RUE ROM/Strength/Tone: [facility] for tasks assessed Left Upper Extremity Assessment LUE ROM/Strength/Tone: WFL for tasks assessed Right Lower Extremity Assessment RLE ROM/Strength/Tone: Deficits RLE ROM/Strength/Tone Deficits: grossly 3/5 RLE Sensation: Deficits RLE Sensation Deficits: Numbness to LT entire LE with R>L Left Lower Extremity Assessment LLE ROM/Strength/Tone: Deficits LLE ROM/Strength/Tone Deficits: grossly 3/5 LLE Sensation: Deficits LLE Sensation Deficits: Numbness to LT entire LE   Balance Static Standing Balance Static Standing - Balance Support: Bilateral upper extremity supported;During functional activity Static Standing - Level of Assistance: 4: Min assist Static Standing - Comment/# of Minutes: Needs min assist static stance for stability.    End of Session PT - End of Session Equipment Utilized During Treatment: Gait belt;Oxygen Activity Tolerance: Patient limited by fatigue;Patient limited by pain Patient left: in chair;with call bell/phone within reach;with family/visitor present Nurse Communication: Mobility status       INGOLD,Yaniah Thiemann 01/17/2013, 3:50 PM [facility] Acute Rehabilitation [phone] (pager)

## 2013-01-17 NOTE — Anesthesia Postprocedure Evaluation (Signed)
  Anesthesia Post-op Note  Patient: Ryan [name]  Procedure(s) Performed: Procedure(s): AORTA BIFEMORAL BYPASS GRAFT (N/A)  Patient Location: SICU  Anesthesia Type:General  Level of Consciousness: awake, alert , oriented, patient cooperative and responds to stimulation  Airway and Oxygen Therapy: Patient Spontanous Breathing and Patient connected to nasal cannula oxygen  Post-op Pain: none  Post-op Assessment: Post-op Vital signs reviewed  Post-op Vital Signs: Reviewed and stable  Complications: No apparent anesthesia complications

## 2013-01-17 NOTE — Progress Notes (Signed)
[facility] [place]  Subjective  - POD #1. S/p ABF  Remained intubated overnight Awake and alert this am C/o ET tube being uncomfortable  Physical Exam:  GEN:  NAD PULM:  Intubated Neuro:  Alert, moves extremities x4 to comand Palpable PT pulses bilaterally Incisions clean and dry abd soft       Assessment/Plan:  POD #1  Pulm:  Will extubate now.  Aggressive pulm toilet  GI:  Keep NPO with NG until passing flatus Renal:  Cr. 1.59 today.  Cr 1.04 in september Required 35 min supra-renal clamp time.  Has good UOP currently.  Will                  keep hydrated and monitor.  Keep foley in x 1 more day.  Will  Not give Toradol Endocrine:  On insulin drip.  Will try to transition to SSI.  H/O elevated GLC pre-op Activity:  OOB walking or at least to chair today.  Keep in ICU today ID:  Peri-operative Abx only Cardiology:  LeBaur to see as courtesy visit toady.  Very sensitive to Levophed, will try to wean off today Acute blood loss anemia:  HCT 37 today.  Received blood in OR.  Will monitor for now.  No need for transfusion                          curently   Ryan [name] IV, V. WELLS 01/17/2013 7:36 AM --  Filed Vitals:   01/17/13 0630  BP: 111/66  Pulse: 84  Temp: 100 F (37.8 C)  Resp: 30    Intake/Output Summary (Last 24 hours) at 01/17/13 0736 Last data filed at 01/17/13 0600  Gross per 24 hour  Intake 10502.1 ml  Output   3045 ml  Net 7457.1 ml     Laboratory CBC    Component Value Date/Time   WBC 19.0* 01/17/2013 0410   HGB 12.6* 01/17/2013 0410   HCT 37.0* 01/17/2013 0410   PLT 109* 01/17/2013 0410    BMET    Component Value Date/Time   NA 141 01/17/2013 0410   K 3.6 01/17/2013 0410   CL 110 01/17/2013 0410   CO2 20 01/17/2013 0410   GLUCOSE 140* 01/17/2013 0410   BUN 21 01/17/2013 0410   CREATININE 1.59* 01/17/2013 0410   CALCIUM 6.7* 01/17/2013 0410   GFRNONAA 51* 01/17/2013 0410   GFRAA 59* 01/17/2013 0410    COAG Lab  Results  Component Value Date   INR 1.15 01/16/2013   INR 0.91 01/08/2013   INR 0.9 01/24/2012   No results found for this basename: PTT    Antibiotics Anti-infectives   Start     Dose/Rate Route Frequency Ordered Stop   01/17/13 0015  cefUROXime (ZINACEF) 1.5 g in dextrose 5 % 50 mL IVPB     1.5 g 100 mL/hr over 30 Minutes Intravenous Every 12 hours 01/16/13 1604 01/18/13 0014   01/15/13 1426  cefUROXime (ZINACEF) 1.5 g in dextrose 5 % 50 mL IVPB     1.5 g 100 mL/hr over 30 Minutes Intravenous 30 min pre-op 01/15/13 1426 01/16/13 1216       V. Charlena Cross, M.D. [facility] [place] Office: [phone] Pager:  [phone]

## 2013-01-18 LAB — BASIC METABOLIC PANEL
BUN: 17 mg/dL (ref 6–23)
CO2: 26 mEq/L (ref 19–32)
Calcium: 7.1 mg/dL — ABNORMAL LOW (ref 8.4–10.5)
Chloride: 109 mEq/L (ref 96–112)
Creatinine, Ser: 1.22 mg/dL (ref 0.50–1.35)
GFR calc Af Amer: 81 mL/min — ABNORMAL LOW (ref >90)
GFR calc non Af Amer: 70 mL/min — ABNORMAL LOW (ref >90)
Glucose, Bld: 173 mg/dL — ABNORMAL HIGH (ref 70–99)
Potassium: 3.4 mEq/L — ABNORMAL LOW (ref 3.5–5.1)
Sodium: 146 mEq/L — ABNORMAL HIGH (ref 135–145)

## 2013-01-18 LAB — POCT I-STAT 7, (LYTES, BLD GAS, ICA,H+H)
Acid-base deficit: 5 mmol/L — ABNORMAL HIGH (ref 0.0–2.0)
Bicarbonate: 21.4 mEq/L (ref 20.0–24.0)
Calcium, Ion: 0.97 mmol/L — ABNORMAL LOW (ref 1.12–1.23)
HCT: 41 % (ref 39.0–52.0)
HCT: 36 % — ABNORMAL LOW (ref 39.0–52.0)
Hemoglobin: 13.9 g/dL (ref 13.0–17.0)
Hemoglobin: 12.2 g/dL — ABNORMAL LOW (ref 13.0–17.0)
Potassium: 4.7 mEq/L (ref 3.5–5.1)
Sodium: 134 mEq/L — ABNORMAL LOW (ref 135–145)
TCO2: 22 mmol/L (ref 0–100)
pH, Arterial: 7.357 (ref 7.350–7.450)
pH, Arterial: 7.327 — ABNORMAL LOW (ref 7.350–7.450)
pO2, Arterial: 161 mmHg — ABNORMAL HIGH (ref 80.0–100.0)

## 2013-01-18 LAB — CBC
HCT: 29.7 % — ABNORMAL LOW (ref 39.0–52.0)
Hemoglobin: 10.2 g/dL — ABNORMAL LOW (ref 13.0–17.0)
MCH: 32 pg (ref 26.0–34.0)
RBC: 3.19 MIL/uL — ABNORMAL LOW (ref 4.22–5.81)

## 2013-01-18 LAB — GLUCOSE, CAPILLARY
Glucose-Capillary: 147 mg/dL — ABNORMAL HIGH (ref 70–99)
Glucose-Capillary: 117 mg/dL — ABNORMAL HIGH (ref 70–99)
Glucose-Capillary: 169 mg/dL — ABNORMAL HIGH (ref 70–99)
Glucose-Capillary: 168 mg/dL — ABNORMAL HIGH (ref 70–99)
Glucose-Capillary: 155 mg/dL — ABNORMAL HIGH (ref 70–99)
Glucose-Capillary: 146 mg/dL — ABNORMAL HIGH (ref 70–99)
Glucose-Capillary: 134 mg/dL — ABNORMAL HIGH (ref 70–99)
Glucose-Capillary: 148 mg/dL — ABNORMAL HIGH (ref 70–99)

## 2013-01-18 LAB — HEMOGLOBIN AND HEMATOCRIT, BLOOD: Hemoglobin: 9.2 g/dL — ABNORMAL LOW (ref 13.0–17.0)

## 2013-01-18 LAB — POCT I-STAT 4, (NA,K, GLUC, HGB,HCT)
Glucose, Bld: 302 mg/dL — ABNORMAL HIGH (ref 70–99)
Hemoglobin: 12.6 g/dL — ABNORMAL LOW (ref 13.0–17.0)

## 2013-01-18 MED ORDER — INSULIN GLARGINE 100 UNIT/ML ~~LOC~~ SOLN
12.0000 [IU] | Freq: Every day | SUBCUTANEOUS | Status: DC
  Administered 2013-01-18 – 2013-01-21 (×4): 12 [IU] via SUBCUTANEOUS

## 2013-01-18 MED ORDER — INSULIN ASPART 100 UNIT/ML ~~LOC~~ SOLN
0.0000 [IU] | SUBCUTANEOUS | Status: DC
  Administered 2013-01-18 (×2): 2 [IU] via SUBCUTANEOUS
  Administered 2013-01-18 – 2013-01-19 (×2): 3 [IU] via SUBCUTANEOUS
  Administered 2013-01-19 (×3): 2 [IU] via SUBCUTANEOUS
  Administered 2013-01-19: 3 [IU] via SUBCUTANEOUS
  Administered 2013-01-20 (×3): 2 [IU] via SUBCUTANEOUS

## 2013-01-18 MED ORDER — ENOXAPARIN SODIUM 40 MG/0.4ML ~~LOC~~ SOLN
40.0000 mg | SUBCUTANEOUS | Status: DC
  Administered 2013-01-18 – 2013-01-21 (×4): 40 mg via SUBCUTANEOUS
  Filled 2013-01-18 (×5): qty 0.4

## 2013-01-18 MED ORDER — INSULIN ASPART 100 UNIT/ML ~~LOC~~ SOLN: 0.0000 [IU] | Freq: Three times a day (TID) | SUBCUTANEOUS | Status: DC

## 2013-01-18 MED ORDER — INSULIN GLARGINE 100 UNIT/ML ~~LOC~~ SOLN
12.0000 [IU] | Freq: Once | SUBCUTANEOUS | Status: AC
  Administered 2013-01-18: 12 [IU] via SUBCUTANEOUS

## 2013-01-18 NOTE — Progress Notes (Signed)
[facility] [place]  Daily Progress Note  Assessment/Planning: POD #2 s/p ABF BPG   Still waiting return of bowel function  Continue NGT  Okay to give cup of ice chips  Cr improving and UOP good so D/C foley  IS x 10 q1 hrs  Still tachycardiac and 4 L/hr oxygen -> stay in ICU  Despite H/H drop, pt isn't behaving as if a significant bleed except for tachycardia.  Recheck later today.  Subjective  - 2 Days Post-Op  Tolerable pain  Objective Filed Vitals:   01/18/13 0700 01/18/13 0717 01/18/13 0734 01/18/13 0800  BP: 122/75   127/84  Pulse: 111   117  Temp:   97.5 F (36.4 C)   TempSrc:   Oral   Resp: 25 26  29   Height:      Weight:      SpO2: 94% 96%  97%    Intake/Output Summary (Last 24 hours) at 01/18/13 0847 Last data filed at 01/18/13 0800  Gross per 24 hour  Intake 2656.22 ml  Output   3230 ml  Net -573.78 ml    PULM  CTAB CV  RRR GI  soft, NTND VASC  Both feet warm, palpable PT  Laboratory CBC    Component Value Date/Time   WBC 17.1* 01/18/2013 0430   HGB 10.2* 01/18/2013 0430   HCT 29.7* 01/18/2013 0430   PLT 97* 01/18/2013 0430    BMET    Component Value Date/Time   NA 146* 01/18/2013 0430   K 3.4* 01/18/2013 0430   CL 109 01/18/2013 0430   CO2 26 01/18/2013 0430   GLUCOSE 173* 01/18/2013 0430   BUN 17 01/18/2013 0430   CREATININE 1.22 01/18/2013 0430   CALCIUM 7.1* 01/18/2013 0430   GFRNONAA 70* 01/18/2013 0430   GFRAA 81* 01/18/2013 0430    Leonides Sake, MD [facility] [place] Office: [phone] Pager: [phone]  01/18/2013, 8:47 AM

## 2013-01-18 NOTE — Progress Notes (Signed)
Notified Dr. Imogene Burn regarding pt's blood glucose levels and insulin gtt rate over the last several hours.  He will consult internist to transition pt off insulin gtt and for diabetes management.  Will continue on insulin gtt for now.

## 2013-01-18 NOTE — Consult Note (Signed)
[facility] Medical Consultation  Ryan [name]:[id number] DOB: [birth year] DOA: 01/16/2013 PCP: Ryan Carrow, MD   Requesting physician: Dr. Imogene Burn Date of consultation: 01/18/13 Reason for consultation: Assistance with diabetes management.  Impression/Recommendations 1-PVD: per primary team. S/P ABF BPG; improving. Denies CP, significant abdominal pain or SOB.  2-Diabetes: no taking any medications at home per review of his home meds. A1C 9.1 demonstrating poor control. At this moment has required insulin drip. Will start treatment with lantus and SSI; CBG's q4hrs while NPO. Further adjustments depending on sugar fluctuation.  3-HLD: continue statins.  4-GERD: continue PPI  5-post operative ileus: passing flatus and currently w/o nausea or vomiting. Continue NPO status and NGT.   6-Tachycardia: continue iv B-blocker; will check TSH   I will followup again tomorrow. Please contact me if I can be of assistance in the meanwhile. Thank you for this consultation.  Chief Complaint: left arm numbness, claudication and PVD symptoms. TRH consulted due to Diabetes.  HPI:  47 y/o male with PVD, and history also of HTN, CAD, DM, HLD. Admitted secondary to left arm numbness and weakness with activity and complaints of left greater than right leg claudication at 20-30 feet. Patient was admitted by vascular team for ABF BPG. TRH consulted to assist with management of his diabetes.   Review of Systems:  Negative except as mentioned on HPI.  Past Medical History  Diagnosis Date  . MI (myocardial infarction)     AMI 1/13 - complicated by VT/Tosades  . Chronic back pain   . Anal fissure   . Rectal polyp   . Mixed hyperlipidemia   . PAD (peripheral artery disease)   . Hypertension     "patient states does not have primary doctor"  . Kidney stones   . GERD (gastroesophageal reflux disease)     "takes tums"  . Chronic low back pain     hx of  . Coronary atherosclerosis of  native coronary artery     DES LAD 1/13, LVEF 35%, sees Dr. Sanjuana Kava  . Diabetes mellitus without complication     "borderline diabetic"   Past Surgical History  Procedure Laterality Date  . Appendectomy    . Diagnostic laparoscopy    . Anal fissurectomy    . Coronary angioplasty      stent placed Dec 12, 2011   Social History:  reports that he has been smoking Cigarettes.  He has a 25 pack-year smoking history. He has never used smokeless tobacco. He reports that  drinks alcohol. He reports that he does not use illicit drugs.  Allergies  Allergen Reactions  . Hydrocodone Nausea And Vomiting   Family History  Problem Relation Age of Onset  . Cancer Father     Lung  . Cancer Maternal Uncle     Colon  . Diabetes Mother   . COPD Mother     Prior to Admission medications   Medication Sig Start Date End Date Taking? Authorizing Provider  aspirin 81 MG tablet Take 162 mg by mouth daily.    Yes Historical Provider, MD  carvedilol (COREG) 6.25 MG tablet Take 1 tablet (6.25 mg total) by mouth 2 (two) times daily with a meal. 07/08/12  Yes Kathleene Hazel, MD  cyclobenzaprine (FLEXERIL) 10 MG tablet Take 1 tablet (10 mg total) by mouth 2 (two) times daily as needed for muscle spasms. 08/29/12  Yes Tiffany Irine Seal, PA  enalapril (VASOTEC) 5 MG tablet Take 1 tablet (5 mg total) by mouth 2 (  two) times daily. 07/08/12  Yes Kathleene Hazel, MD  oxyCODONE-acetaminophen (ROXICET) 5-325 MG per tablet Take 1-2 tablets by mouth every 6 hours as needed for pain 12/26/12  Yes Kathleene Hazel, MD  simvastatin (ZOCOR) 40 MG tablet Take 1 tablet (40 mg total) by mouth every evening. 07/09/12  Yes Kathleene Hazel, MD  spironolactone (ALDACTONE) 25 MG tablet Take 0.5 tablets (12.5 mg total) by mouth daily. 07/08/12  Yes Kathleene Hazel, MD  nitroGLYCERIN (NITROSTAT) 0.4 MG SL tablet Place 1 tablet (0.4 mg total) under the tongue every 5 (five) minutes as needed. For chest pain  01/14/13   Jodelle Gross, NP   Physical Exam: Blood pressure 153/72, pulse 130, temperature 97.4 F (36.3 C), temperature source Oral, resp. rate 29, height 5\' 8"  (1.727 m), weight 100.2 kg (220 lb 14.4 oz), SpO2 94.00%. Filed Vitals:   01/18/13 1500 01/18/13 1600 01/18/13 1608 01/18/13 1700  BP: 129/67 140/79  153/72  Pulse: 119 121  130  Temp:   97.4 F (36.3 C)   TempSrc:   Oral   Resp: 28 27  29   Height:      Weight:      SpO2: 95% 92%  94%     General:  Complaining of pain in his abdomen and tights; afebrile  Eyes: no icterus, no nystagmus, PERRL, EOMI  ENT: dry MM, ngt in place, no drainage out or ears or nostrils; no erythema or exudates  Neck: supple, no thyromegaly  Cardiovascular: tachycardic, no rubs or gallops  Respiratory: no wheezing or crackles  Abdomen: sore in the incision area; but wound is clean and w/o drainage; no BS, mild distension  Musculoskeletal: no joint swelling or erythema; positive edema on his LE; 2++  Psychiatric: no hallucinations; normal mood  Neurologic: no focal deficit.  Labs on Admission:  Basic Metabolic Panel:  Recent Labs Lab 01/16/13 1505 01/16/13 1605 01/16/13 1634 01/17/13 0410 01/18/13 0430  NA 139 137 136 141 146*  K 4.7 5.7* 5.8* 3.6 3.4*  CL  --  105 109 110 109  CO2  --  21  --  20 26  GLUCOSE 269* 293* 290* 140* 173*  BUN  --  20 21 21 17   CREATININE  --  1.45* 1.50* 1.59* 1.22  CALCIUM  --  6.6*  --  6.7* 7.1*  MG  --  1.4*  --  1.3*  --    Liver Function Tests:  Recent Labs Lab 01/17/13 0410  AST 67*  ALT 57*  ALKPHOS 36*  BILITOT 0.7  PROT 4.8*  ALBUMIN 2.7*    Recent Labs Lab 01/17/13 0410  AMYLASE 42   CBC:  Recent Labs Lab 01/16/13 1505 01/16/13 1605 01/16/13 1634 01/17/13 0410 01/18/13 0430  WBC  --  25.0*  --  19.0* 17.1*  HGB 13.6 14.2 14.3 12.6* 10.2*  HCT 40.0 41.1 42.0 37.0* 29.7*  MCV  --  93.8  --  92.5 93.1  PLT  --  130*  --  109* 97*   CBG:  Recent  Labs Lab 01/18/13 0121 01/18/13 0223 01/18/13 0315 01/18/13 0731 01/18/13 1606  GLUCAP 147* 155* 117* 184* 148*    Radiological Exams on Admission: No results found.  Time spent: >30 minutes  [name] [facility] Pager [phone]  If 7PM-7AM, please contact night-coverage [email or web address] Password [facility] 01/18/2013, 5:55 PM

## 2013-01-19 DIAGNOSIS — I739 Peripheral vascular disease, unspecified: Secondary | ICD-10-CM

## 2013-01-19 DIAGNOSIS — E782 Mixed hyperlipidemia: Secondary | ICD-10-CM

## 2013-01-19 DIAGNOSIS — E119 Type 2 diabetes mellitus without complications: Secondary | ICD-10-CM

## 2013-01-19 LAB — GLUCOSE, CAPILLARY
Glucose-Capillary: 144 mg/dL — ABNORMAL HIGH (ref 70–99)
Glucose-Capillary: 153 mg/dL — ABNORMAL HIGH (ref 70–99)
Glucose-Capillary: 186 mg/dL — ABNORMAL HIGH (ref 70–99)
Glucose-Capillary: 158 mg/dL — ABNORMAL HIGH (ref 70–99)
Glucose-Capillary: 158 mg/dL — ABNORMAL HIGH (ref 70–99)
Glucose-Capillary: 158 mg/dL — ABNORMAL HIGH (ref 70–99)
Glucose-Capillary: 160 mg/dL — ABNORMAL HIGH (ref 70–99)
Glucose-Capillary: 181 mg/dL — ABNORMAL HIGH (ref 70–99)
Glucose-Capillary: 194 mg/dL — ABNORMAL HIGH (ref 70–99)

## 2013-01-19 LAB — BASIC METABOLIC PANEL
BUN: 14 mg/dL (ref 6–23)
CO2: 23 mEq/L (ref 19–32)
Calcium: 7.2 mg/dL — ABNORMAL LOW (ref 8.4–10.5)
Creatinine, Ser: 1.01 mg/dL (ref 0.50–1.35)
GFR calc non Af Amer: 87 mL/min — ABNORMAL LOW (ref >90)
Glucose, Bld: 167 mg/dL — ABNORMAL HIGH (ref 70–99)
Sodium: 135 mEq/L (ref 135–145)

## 2013-01-19 LAB — CBC
Hemoglobin: 8.9 g/dL — ABNORMAL LOW (ref 13.0–17.0)
MCH: 32.1 pg (ref 26.0–34.0)
MCHC: 34 g/dL (ref 30.0–36.0)
MCV: 94.6 fL (ref 78.0–100.0)
RBC: 2.77 MIL/uL — ABNORMAL LOW (ref 4.22–5.81)

## 2013-01-19 LAB — T4, FREE: Free T4: 0.86 ng/dL (ref 0.80–1.80)

## 2013-01-19 MED ORDER — ASPIRIN 81 MG PO TABS: 81.0000 mg | ORAL_TABLET | Freq: Every day | ORAL | Status: DC

## 2013-01-19 MED ORDER — SIMVASTATIN 40 MG PO TABS
40.0000 mg | ORAL_TABLET | Freq: Every evening | ORAL | Status: DC
  Administered 2013-01-19 – 2013-01-21 (×3): 40 mg via ORAL
  Filled 2013-01-19 (×5): qty 1

## 2013-01-19 MED ORDER — ENALAPRIL MALEATE 5 MG PO TABS
5.0000 mg | ORAL_TABLET | Freq: Two times a day (BID) | ORAL | Status: DC
  Administered 2013-01-19 – 2013-01-22 (×6): 5 mg via ORAL
  Filled 2013-01-19 (×9): qty 1

## 2013-01-19 MED ORDER — SPIRONOLACTONE 5 MG/ML ORAL SUSPENSION
6.2500 mg | Freq: Every day | ORAL | Status: DC
  Administered 2013-01-20 – 2013-01-22 (×3): 6.5 mg via ORAL
  Filled 2013-01-19 (×4): qty 1.3

## 2013-01-19 MED ORDER — CARVEDILOL 6.25 MG PO TABS
6.2500 mg | ORAL_TABLET | Freq: Two times a day (BID) | ORAL | Status: DC
  Administered 2013-01-19 – 2013-01-22 (×6): 6.25 mg via ORAL
  Filled 2013-01-19 (×11): qty 1

## 2013-01-19 MED ORDER — POTASSIUM CHLORIDE 10 MEQ/50ML IV SOLN
10.0000 meq | INTRAVENOUS | Status: AC
  Administered 2013-01-19 (×4): 10 meq via INTRAVENOUS
  Filled 2013-01-19 (×3): qty 50

## 2013-01-19 MED ORDER — POTASSIUM CHLORIDE 10 MEQ/50ML IV SOLN
INTRAVENOUS | Status: AC
  Administered 2013-01-19: 10 meq via INTRAVENOUS
  Filled 2013-01-19: qty 50

## 2013-01-19 MED ORDER — ASPIRIN 81 MG PO CHEW
81.0000 mg | CHEWABLE_TABLET | Freq: Every day | ORAL | Status: DC
  Administered 2013-01-20 – 2013-01-22 (×3): 81 mg via ORAL
  Filled 2013-01-19 (×3): qty 1

## 2013-01-19 MED ORDER — SPIRONOLACTONE 25 MG PO TABS: 6.2500 mg | ORAL_TABLET | Freq: Every day | ORAL | Status: DC

## 2013-01-19 MED ORDER — SODIUM CHLORIDE 0.9 % IJ SOLN
INTRAMUSCULAR | Status: AC
  Administered 2013-01-19: 10 mL
  Filled 2013-01-19: qty 10

## 2013-01-19 NOTE — Progress Notes (Signed)
NG tube and RIJ sleeve d/c'd per order. Pt tolerated well. Will continue to monitor.

## 2013-01-19 NOTE — Progress Notes (Addendum)
[facility] [place]  Daily Progress Note  Assessment/Planning: POD #3 s/p ABF BPG   NGT clamp trials  Start clears and oral rx if NGT d/c  H/H stable.    Appreciate help from IM with DM mgmt   Ok to go to 3300  Subjective  - 3 Days Post-Op  +flatus, wants NGT out  Objective Filed Vitals:   01/19/13 0500 01/19/13 0600 01/19/13 0700 01/19/13 0814  BP: 136/78 117/71 111/73   Pulse: 113 105 107   Temp:    98 F (36.7 C)  TempSrc:    Oral  Resp: 23 28 28    Height:      Weight:      SpO2: 97% 96% 96%     Intake/Output Summary (Last 24 hours) at 01/19/13 0820 Last data filed at 01/19/13 0644  Gross per 24 hour  Intake 3181.5 ml  Output   2060 ml  Net 1121.5 ml    PULM  CTAB CV  RRR GI  soft, appropriately TTP, distended, -G/R VASC  Both feet warm and well perfused  Laboratory CBC    Component Value Date/Time   WBC 12.9* 01/19/2013 0450   HGB 8.9* 01/19/2013 0450   HCT 26.2* 01/19/2013 0450   PLT 99* 01/19/2013 0450    BMET    Component Value Date/Time   NA 135 01/19/2013 0450   K 3.1* 01/19/2013 0450   CL 102 01/19/2013 0450   CO2 23 01/19/2013 0450   GLUCOSE 167* 01/19/2013 0450   BUN 14 01/19/2013 0450   CREATININE 1.01 01/19/2013 0450   CALCIUM 7.2* 01/19/2013 0450   GFRNONAA 87* 01/19/2013 0450   GFRAA >90 01/19/2013 0450    Leonides Sake, MD [facility] [place] Office: [phone] Pager: [phone]  01/19/2013, 8:20 AM

## 2013-01-19 NOTE — Progress Notes (Signed)
[facility] Medical Consultation  JAMONE GARRIDO ZOX:[id number] DOB: [birth year] DOA: 01/16/2013 PCP: [name], MD   Requesting physician: Dr. Imogene Burn Date of consultation: 01/18/13 Reason for consultation: Assistance with diabetes management.  Impression/Recommendations 1-PVD: per primary team. S/P ABF BPG; improving. Denies CP, significant abdominal pain or SOB.  2-Diabetes: no taking any medications at home per review of his home meds. A1C 9.1 demonstrating poor control. Off insulin drip; good control with current lantus and SSI. Further adjustment when diet resume. Order for diabetes educator to provide teaching and any inputs/assistance for outpatient treatment.  3-HLD: continue statins. When able to tolerate PO  4-GERD: continue PPI  5-post operative ileus: passing flatus and currently w/o nausea or vomiting. Continue NPO status and NGT. Per surgery probably will advance to CLD later today.  6-Tachycardia: continue iv B-blocker; TSH WNL  I will followup again tomorrow. Please contact me if I can be of assistance in the meanwhile. Thank you for this consultation.  Scheduled Meds: . enoxaparin (LOVENOX) injection  40 mg Subcutaneous Q24H  . fentaNYL   Intravenous Q4H  . insulin aspart  0-15 Units Subcutaneous Q4H  . insulin glargine  12 Units Subcutaneous QHS  . metoprolol  2.5 mg Intravenous Q6H  . neomycin-bacitracin-polymyxin   Topical TID  . pantoprazole (PROTONIX) IV  40 mg Intravenous QHS   Continuous Infusions: . sodium chloride 100 mL/hr at 01/19/13 0457   PRN Meds:.acetaminophen, acetaminophen, diphenhydrAMINE, diphenhydrAMINE, hydrALAZINE, magnesium sulfate 1 - 4 g bolus IVPB, metoprolol, naloxone, nitroGLYCERIN, ondansetron, phenol, potassium chloride, sodium chloride    Physical Exam: Blood pressure 140/81, pulse 109, temperature 98 F (36.7 C), temperature source Oral, resp. rate 28, height 5\' 8"  (1.727 m), weight 100.2 kg (220 lb 14.4 oz), SpO2  98.00%. Filed Vitals:   01/19/13 0700 01/19/13 0800 01/19/13 0814 01/19/13 0900  BP: 111/73 125/70  140/81  Pulse: 107 111  109  Temp:   98 F (36.7 C)   TempSrc:   Oral   Resp: 28 22  28   Height:      Weight:      SpO2: 96% 98%  98%     General:  Complaining of pain in his abdomen and tights; afebrile; hungry and will like NGT out  Eyes: no icterus, no nystagmus, PERRL, EOMI  ENT: dry MM, ngt in place, no drainage out or ears or nostrils; no erythema or exudates  Neck: supple, no thyromegaly  Cardiovascular: tachycardic, no rubs or gallops  Respiratory: no wheezing or crackles  Abdomen: sore in the incision area; but wound is clean and w/o drainage; decrease but positive BS, mild distension; passing flatus  Musculoskeletal: no joint swelling or erythema; positive edema on his LE; 2++  Psychiatric: no hallucinations; normal mood  Neurologic: no focal deficit.  Labs on Admission:  Basic Metabolic Panel:  Recent Labs Lab 01/16/13 1605 01/16/13 1634 01/17/13 0410 01/18/13 0430 01/19/13 0450  NA 137 136 141 146* 135  K 5.7* 5.8* 3.6 3.4* 3.1*  CL 105 109 110 109 102  CO2 21  --  20 26 23   GLUCOSE 293* 290* 140* 173* 167*  BUN 20 21 21 17 14   CREATININE 1.45* 1.50* 1.59* 1.22 1.01  CALCIUM 6.6*  --  6.7* 7.1* 7.2*  MG 1.4*  --  1.3*  --   --    Liver Function Tests:  Recent Labs Lab 01/17/13 0410  AST 67*  ALT 57*  ALKPHOS 36*  BILITOT 0.7  PROT 4.8*  ALBUMIN 2.7*  Recent Labs Lab 01/17/13 0410  AMYLASE 42   CBC:  Recent Labs Lab 01/16/13 1605 01/16/13 1634 01/17/13 0410 01/18/13 0430 01/18/13 1800 01/19/13 0450  WBC 25.0*  --  19.0* 17.1*  --  12.9*  HGB 14.2 14.3 12.6* 10.2* 9.2* 8.9*  HCT 41.1 42.0 37.0* 29.7* 27.0* 26.2*  MCV 93.8  --  92.5 93.1  --  94.6  PLT 130*  --  109* 97*  --  99*   CBG:  Recent Labs Lab 01/18/13 1345 01/18/13 1457 01/18/13 1606 01/19/13 0323 01/19/13 0812  GLUCAP 134* 153* 148* 146* 144*     Radiological Exams on Admission: No results found.  Time spent: < 30 minutes  Benjie Ricketson [facility] Pager [phone]  If 7PM-7AM, please contact night-coverage [email or web address] Password [facility] 01/19/2013, 11:08 AM

## 2013-01-19 NOTE — Progress Notes (Signed)
Pt transferred to 3314 via wheelchair with RN. VSS. Receiving RN at bedside. Pt placed on monitor. Pt tolerated well

## 2013-01-20 ENCOUNTER — Telehealth: Payer: Self-pay | Admitting: Surgery

## 2013-01-20 ENCOUNTER — Other Ambulatory Visit: Payer: Self-pay | Admitting: *Deleted

## 2013-01-20 ENCOUNTER — Encounter (HOSPITAL_COMMUNITY): Payer: Self-pay | Admitting: Surgery

## 2013-01-20 DIAGNOSIS — I739 Peripheral vascular disease, unspecified: Secondary | ICD-10-CM

## 2013-01-20 DIAGNOSIS — Z48812 Encounter for surgical aftercare following surgery on the circulatory system: Secondary | ICD-10-CM

## 2013-01-20 DIAGNOSIS — G8918 Other acute postprocedural pain: Secondary | ICD-10-CM

## 2013-01-20 LAB — BASIC METABOLIC PANEL
BUN: 14 mg/dL (ref 6–23)
BUN: 13 mg/dL (ref 6–23)
CO2: 27 mEq/L (ref 19–32)
Calcium: 8 mg/dL — ABNORMAL LOW (ref 8.4–10.5)
Chloride: 103 mEq/L (ref 96–112)
Creatinine, Ser: 0.92 mg/dL (ref 0.50–1.35)
GFR calc Af Amer: >90 mL/min (ref >90)
GFR calc non Af Amer: >90 mL/min (ref >90)
Glucose, Bld: 144 mg/dL — ABNORMAL HIGH (ref 70–99)
Glucose, Bld: 128 mg/dL — ABNORMAL HIGH (ref 70–99)
Potassium: 3.1 mEq/L — ABNORMAL LOW (ref 3.5–5.1)
Potassium: 2.9 mEq/L — ABNORMAL LOW (ref 3.5–5.1)
Sodium: 136 mEq/L (ref 135–145)

## 2013-01-20 LAB — GLUCOSE, CAPILLARY

## 2013-01-20 MED ORDER — OXYCODONE HCL 5 MG PO TABS
10.0000 mg | ORAL_TABLET | Freq: Four times a day (QID) | ORAL | Status: DC | PRN
  Administered 2013-01-20 – 2013-01-22 (×6): 10 mg via ORAL
  Filled 2013-01-20 (×6): qty 2

## 2013-01-20 MED ORDER — FUROSEMIDE 10 MG/ML IJ SOLN
20.0000 mg | Freq: Once | INTRAMUSCULAR | Status: AC
  Administered 2013-01-20: 20 mg via INTRAVENOUS

## 2013-01-20 MED ORDER — PANTOPRAZOLE SODIUM 40 MG PO TBEC
40.0000 mg | DELAYED_RELEASE_TABLET | Freq: Every day | ORAL | Status: DC
  Administered 2013-01-20 – 2013-01-22 (×3): 40 mg via ORAL
  Filled 2013-01-20 (×4): qty 1

## 2013-01-20 MED ORDER — INSULIN ASPART 100 UNIT/ML ~~LOC~~ SOLN
0.0000 [IU] | Freq: Three times a day (TID) | SUBCUTANEOUS | Status: DC
  Administered 2013-01-20 (×2): 2 [IU] via SUBCUTANEOUS
  Administered 2013-01-21: 3 [IU] via SUBCUTANEOUS
  Administered 2013-01-21 (×2): 2 [IU] via SUBCUTANEOUS
  Administered 2013-01-22: 3 [IU] via SUBCUTANEOUS

## 2013-01-20 MED FILL — Heparin Sodium (Porcine) Inj 1000 Unit/ML: INTRAMUSCULAR | Qty: 30 | Status: AC

## 2013-01-20 MED FILL — Sodium Chloride IV Soln 0.9%: INTRAVENOUS | Qty: 1000 | Status: AC

## 2013-01-20 MED FILL — Sodium Chloride Irrigation Soln 0.9%: Qty: 3000 | Status: AC

## 2013-01-20 NOTE — Care Management Note (Unsigned)
    Page 1 of 1   01/21/2013     3:19:29 PM   CARE MANAGEMENT NOTE 01/21/2013  Patient:  Ryan [name], Ryan [name]   Account Number:  [id number]  Date Initiated:  01/20/2013  Documentation initiated by:  Latresha Yahr  Subjective/Objective Assessment:   PT S/P AORTOBIFEM BYPASS GRAFT ON 01/16/13.  PTA, PT RESIDES AT HOME WITH SIGNIFICANT OTHER.     Action/Plan:   WILL FOLLOW FOR HOME NEEDS AS PT PROGRESSES.  PT/OT RECOMMENDING HOME HEALTH FOLLOW UP.   Anticipated DC Date:  01/22/2013   Anticipated DC Plan:  HOME W HOME HEALTH SERVICES      DC Planning Services  CM consult      Choice offered to / List presented to:             Status of service:  In process, will continue to follow Medicare Important Message given?   (If response is "NO", the following Medicare IM given date fields will be blank) Date Medicare IM given:   Date Additional Medicare IM given:    Discharge Disposition:    Per UR Regulation:  Reviewed for med. necessity/level of care/duration of stay  If discussed at Long Length of Stay Meetings, dates discussed:    Comments:  01/21/13 Rosalita Chessman [phone] PT ALSO REQUESTING RW FOR HOME.  LEFT NOTE FOR HH AND DME ORDERS.  PT HAS NO PCP.Marland KitchenMarland KitchenAGREEABLE TO [facility] FOR FOLLOW UP.  APPT MADE FOR WEDNEDSDAY, MARCH 5, AT 11:00.  APPT ON DISCHARGE INFO.

## 2013-01-20 NOTE — Progress Notes (Signed)
[facility]   Subjective  - POD #4  Feels better this am Positive BM and flatus Walked yesterday Tolerated clears   Physical Exam:  Eschar to left nares abd distended but sof, incisions clean Palpable PT pulses bilaterally       Assessment/Plan:  POD #4  Acute renal insufficiency - resolved GU:  Will give lasix today to help with diuresis F/E/N:  Check lytes after lasix today and in am, saline lock IV Acute blood loss anemia:  Currently Asx, will repeat CBC in am Dispo:  Transfer to 2000 GI:  Advance diet to regular ACT:  OOB and walking Likely home in 1-2 days Start PO pain meds.  Keep PCA today and d/c tomorrow  [facility] IV, Lala Lund 01/20/2013 7:57 AM --  Filed Vitals:   01/20/13 0400  BP:   Pulse:   Temp:   Resp: 26    Intake/Output Summary (Last 24 hours) at 01/20/13 0757 Last data filed at 01/20/13 0317  Gross per 24 hour  Intake 1551.67 ml  Output   1650 ml  Net -98.33 ml     Laboratory CBC    Component Value Date/Time   WBC 12.9* 01/19/2013 0450   HGB 8.9* 01/19/2013 0450   HCT 26.2* 01/19/2013 0450   PLT 99* 01/19/2013 0450    BMET    Component Value Date/Time   NA 135 01/19/2013 0450   K 3.1* 01/19/2013 0450   CL 102 01/19/2013 0450   CO2 23 01/19/2013 0450   GLUCOSE 167* 01/19/2013 0450   BUN 14 01/19/2013 0450   CREATININE 1.01 01/19/2013 0450   CALCIUM 7.2* 01/19/2013 0450   GFRNONAA 87* 01/19/2013 0450   GFRAA >90 01/19/2013 0450    COAG Lab Results  Component Value Date   INR 1.15 01/16/2013   INR 0.91 01/08/2013   INR 0.9 01/24/2012   No results found for this basename: PTT    Antibiotics Anti-infectives   Start     Dose/Rate Route Frequency Ordered Stop   01/17/13 0015  cefUROXime (ZINACEF) 1.5 g in dextrose 5 % 50 mL IVPB     1.5 g 100 mL/hr over 30 Minutes Intravenous Every 12 hours 01/16/13 1604 01/17/13 1320   01/15/13 1426  cefUROXime (ZINACEF) 1.5 g in dextrose 5 % 50 mL IVPB     1.5  g 100 mL/hr over 30 Minutes Intravenous 30 min pre-op 01/15/13 1426 01/16/13 1216       V. Charlena Cross, M.D. [facility] [place] Office: [phone] Pager:  [phone]

## 2013-01-20 NOTE — Consult Note (Signed)
WOC consult Note Reason for Consult: Consult requested to assess nose for possible pressure ulceroccurrence.  Pt states wound occurred when nurse was removing tape and  his skin tore.  Site is on the bridge of his nose and not in an area consistent with pressure from NG tube.  Neosporin ointment has been ordered to promote moist healing.   Wound type: Full thickness Pressure Ulcer POA: This is NOT a pressure ulcer Measurement: .6X.3X.1cm Wound bed:dry red scabbed area Drainage (amount, consistency, odor) No odor or drainage Periwound: Intact skin surrounding Dressing procedure/placement/frequency: Primary team is following for assessment and plan of care. Will not plan to follow further unless re-consulted.  [address] RN, MSN, [employer]  [phone]

## 2013-01-20 NOTE — Progress Notes (Signed)
[facility] Medical Consultation  Ryan [name] YNW:[id number] DOB: November 28, 1966 DOA: 01/16/2013 PCP: [name], MD   Requesting physician: Dr. Imogene Burn Date of consultation: 01/18/13 Reason for consultation: Assistance with diabetes management.  Impression/Recommendations 1-PVD: per primary team. S/P ABF BPG; improving. Denies CP, significant abdominal pain or SOB.  2-Diabetes: no taking any medications at home per review of his home meds. A1C 9.1 demonstrating poor control. Off insulin drip; good control with lantus 12 units qhs; will now change SSI to moderate and check CBG's QAC/QHS. Follow diabetes coordinator rec's.  3-HLD: continue statins.   4-GERD: continue PPI  5-post operative ileus: passing flatus and now having BM's; currently w/o nausea or vomiting and tolerating diet.   6-Tachycardia: continue B-blocker; TSH WNL  I will followup again tomorrow. Please contact me if I can be of assistance in the meanwhile. Thank you for this consultation.  Scheduled Meds: . aspirin  81 mg Oral Daily  . carvedilol  6.25 mg Oral BID WC  . enalapril  5 mg Oral BID  . enoxaparin (LOVENOX) injection  40 mg Subcutaneous Q24H  . fentaNYL   Intravenous Q4H  . insulin aspart  0-15 Units Subcutaneous TID WC  . insulin glargine  12 Units Subcutaneous QHS  . neomycin-bacitracin-polymyxin   Topical TID  . pantoprazole  40 mg Oral Daily  . simvastatin  40 mg Oral QPM  . spironolactone  6.5 mg Oral Daily   Continuous Infusions:   PRN Meds:.acetaminophen, acetaminophen, diphenhydrAMINE, diphenhydrAMINE, hydrALAZINE, magnesium sulfate 1 - 4 g bolus IVPB, metoprolol, naloxone, nitroGLYCERIN, ondansetron, oxyCODONE, phenol, potassium chloride, sodium chloride   Physical Exam: Blood pressure 121/69, pulse 85, temperature 98.7 F (37.1 C), temperature source Oral, resp. rate 20, height 5\' 8"  (1.727 m), weight 100.2 kg (220 lb 14.4 oz), SpO2 98.00%. Filed Vitals:   01/20/13 1200  01/20/13 1416 01/20/13 2000 01/20/13 2103  BP:  118/61  121/69  Pulse:  103  85  Temp:  97.4 F (36.3 C)  98.7 F (37.1 C)  TempSrc:  Oral  Oral  Resp: 29 20 20 20   Height:      Weight:      SpO2:  93% 99% 98%     General:  Complaining of pain in his abdomen and tights; afebrile; ngt off and tolerating diet.  Eyes: no icterus, no nystagmus, PERRL, EOMI  ENT: dry MM, ngt in place, no drainage out or ears or nostrils; no erythema or exudates  Neck: supple, no thyromegaly  Cardiovascular: tachycardic, no rubs or gallops  Respiratory: no wheezing or crackles  Abdomen: sore in the incision area; but wound is clean and w/o drainage; decrease but positive BS, mild distension; passing flatus and had multiple BM's  Musculoskeletal: no joint swelling or erythema; positive edema on his LE; 2++  Psychiatric: no hallucinations; normal mood  Neurologic: no focal deficit.  Labs on Admission:  Basic Metabolic Panel:  Recent Labs Lab 01/16/13 1605  01/17/13 0410 01/18/13 0430 01/19/13 0450 01/20/13 0827 01/20/13 1304  NA 137  < > 141 146* 135 140 136  K 5.7*  < > 3.6 3.4* 3.1* 3.1* 2.9*  CL 105  < > 110 109 102 103 100  CO2 21  --  20 26 23 24 27   GLUCOSE 293*  < > 140* 173* 167* 144* 128*  BUN 20  < > 21 17 14 14 13   CREATININE 1.45*  < > 1.59* 1.22 1.01 0.92 0.93  CALCIUM 6.6*  --  6.7* 7.1* 7.2*  8.1* 8.0*  MG 1.4*  --  1.3*  --   --   --   --   < > = values in this interval not displayed. Liver Function Tests:  Recent Labs Lab 01/17/13 0410  AST 67*  ALT 57*  ALKPHOS 36*  BILITOT 0.7  PROT 4.8*  ALBUMIN 2.7*    Recent Labs Lab 01/17/13 0410  AMYLASE 42   CBC:  Recent Labs Lab 01/16/13 1605 01/16/13 1634 01/17/13 0410 01/18/13 0430 01/18/13 1800 01/19/13 0450  WBC 25.0*  --  19.0* 17.1*  --  12.9*  HGB 14.2 14.3 12.6* 10.2* 9.2* 8.9*  HCT 41.1 42.0 37.0* 29.7* 27.0* 26.2*  MCV 93.8  --  92.5 93.1  --  94.6  PLT 130*  --  109* 97*  --  99*    CBG:  Recent Labs Lab 01/20/13 0315 01/20/13 0842 01/20/13 1250 01/20/13 1700 01/20/13 2102  GLUCAP 131* 140* 135* 122* 155*    Radiological Exams on Admission: No results found.  Time spent: < 30 minutes  Leaira Fullam [facility] Pager [phone]  If 7PM-7AM, please contact night-coverage [email or web address] Password [facility] 01/20/2013, 9:38 PM

## 2013-01-20 NOTE — Progress Notes (Signed)
Physical Therapy Treatment Patient Details Name: Ryan [name] MRN: [id number] DOB: Jan 21, 1966 Today's Date: 01/20/2013 Time: 1034-1100 PT Time Calculation (min): 26 min  PT Assessment / Plan / Recommendation Comments on Treatment Session  47 y.o. male admitted to [facility] s/p aortobifemoral BPG and post op VDRF.  He presents today progressing well with mobility, increased gait distance and activity tolerance.  He still has some audible WOB with gait, but O2 sates remained stable on RA, HR increased from 80-114 bpm during gait.      Follow Up Recommendations  Home health PT;Supervision/Assistance - 24 hour     Does the patient have the potential to tolerate intense rehabilitation   NA  Barriers to Discharge  none      Equipment Recommendations  Rolling walker with 5" wheels    Recommendations for Other Services  none  Frequency Min 3X/week   Plan Discharge plan remains appropriate;Frequency remains appropriate    Precautions / Restrictions Precautions Precautions: Fall Restrictions Weight Bearing Restrictions: No   Pertinent Vitals/Pain 7/10 incisional pain, PCA encouraged, ambulation and repositioned.  O2 sats during gait on RA in the 90s.  DOE 2/4 with gait.  HR increased from 80 bpm to 114 bpm during gait.     Mobility  Bed Mobility Bed Mobility: Not assessed Transfers Sit to Stand: 4: Min guard;With upper extremity assist;With armrests;From chair/3-in-1 Stand to Sit: 4: Min guard;With upper extremity assist;With armrests;To chair/3-in-1 Details for Transfer Assistance: min guard assist for balance and safety more due to multiple lines than actual weakness.  Verbal cues for safe hand placement and slow speed to sit.   Ambulation/Gait Ambulation/Gait Assistance: 5: Supervision Ambulation Distance (Feet): 450 Feet Assistive device: Rolling walker Ambulation/Gait Assistance Details: supervision for safety, vitals monitoring and cues for safety and posture (after walk encouraged  pt to stand tall (gently) while walking so that he can stretch his incisions as they are healing so that they will not scar down and encourage flexed posture. Gait Pattern: Step-through pattern;Trunk flexed General Gait Details: heavy relinace on upper extremity support on walker likely due more to incisional pain than leg weakness.     Exercises General Exercises - Lower Extremity Ankle Circles/Pumps: AROM;Both;20 reps;Seated Quad Sets: AROM;Both;10 reps;Seated Long Arc Quad: AROM;Both;10 reps;Seated Heel Slides: AROM;Both;10 reps;Seated Straight Leg Raises: AROM;Both;10 reps;Seated Hip Flexion/Marching: AROM;Both;10 reps;Seated Toe Raises: AROM;Both;10 reps;Seated Heel Raises: AROM;Both;10 reps;Seated   PT Diagnosis:    PT Problem List:   PT Treatment Interventions:     PT Goals Acute Rehab PT Goals PT Goal: Sit to Stand - Progress: Progressing toward goal PT Goal: Ambulate - Progress: Progressing toward goal  Visit Information  Last PT Received On: 01/20/13 Assistance Needed: +1 PT/OT Co-Evaluation/Treatment: Yes    Subjective Data  Subjective: Pt reports scrotal swelling and incisional pain.     Cognition  Cognition Overall Cognitive Status: Appears within functional limits for tasks assessed/performed Arousal/Alertness: Awake/alert Orientation Level: Appears intact for tasks assessed Behavior During Session: [facility] for tasks performed Cognition - Other Comments: quick to move with multiple lines, but I think this is baseline behavior.      Balance  Balance Balance Assessed: Yes Static Standing Balance Static Standing - Balance Support: No upper extremity supported;During functional activity Static Standing - Level of Assistance: 5: Stand by assistance Static Standing - Comment/# of Minutes: during functional activity of washing hands and face at sink pt able to maintain balance with supervision for safety and line management for >2 mins.  End of Session PT - End of  Session Activity Tolerance: Patient limited by fatigue;Patient limited by pain Patient left: in chair;with call bell/phone within reach       [facility] B. Deunte Bledsoe, PT, DPT [phone]   01/20/2013, 11:14 AM

## 2013-01-20 NOTE — Telephone Encounter (Addendum)
Message copied by Fredrich Birks on Mon Jan 20, 2013 12:01 PM ------      Message from: Melene Plan      Created: Mon Jan 20, 2013 11:25 AM                   ----- Message -----         From: [place], [place]         Sent: 01/20/2013  10:37 AM           To: Melene Plan, RN            4 weeks F/U AOBF bypass - [facility] - needs ABI's ------  01/20/13- mailed letter and lm, dpm

## 2013-01-20 NOTE — Evaluation (Signed)
Occupational Therapy Evaluation Patient Details Name: Ryan [name] MRN: [id number] DOB: Jun 20, 1966 Today's Date: 01/20/2013 Time: 4098-1191 OT Time Calculation (min): 19 min  OT Assessment / Plan / Recommendation Clinical Impression  47 yo male s/p aortiobifemoral BGP and VDRF with hx of MI. OT to follow acutely. Recommend HHOT for d/c planning    OT Assessment  Patient needs continued OT Services    Follow Up Recommendations  Home health OT    Barriers to Discharge      Equipment Recommendations       Recommendations for Other Services    Frequency  Min 2X/week    Precautions / Restrictions Precautions Precautions: Fall Restrictions Weight Bearing Restrictions: No   Pertinent Vitals/Pain stable    ADL  Eating/Feeding: Modified independent Where Assessed - Eating/Feeding: Chair Grooming: Wash/dry face;Wash/dry hands;Modified independent Where Assessed - Grooming: Unsupported standing Lower Body Dressing: +1 Total assistance Where Assessed - Lower Body Dressing: Supported sitting Toilet Transfer: Supervision/safety Statistician Method: Sit to Barista: Raised toilet seat with arms (or 3-in-1 over toilet) Equipment Used: Rolling walker Transfers/Ambulation Related to ADLs: PT ambulated >100 ft with RW. Pt with dyspnea 2 out 4 with oxgyen RA 90% or better.  ADL Comments: Pt agreeable to ambulation. pt unsupported sitting to void 275 cc into urinal unassisted. pt completed sink level task with excellend static standing. Pt progressing quickly. Pt  LB dressing/ bathing limited by incision site entire length of abdomen with incision sites bil groining. Pt could benefit from LB AE education next session (scrotal edema- declines underwear for elevatoin)    OT Diagnosis: Generalized weakness;Acute pain  OT Problem List: Decreased strength;Decreased activity tolerance;Impaired balance (sitting and/or standing);Decreased safety awareness;Decreased  knowledge of use of DME or AE;Decreased knowledge of precautions;Pain OT Treatment Interventions: Self-care/ADL training;DME and/or AE instruction;Energy conservation;Therapeutic activities;Patient/family education;Balance training   OT Goals Acute Rehab OT Goals OT Goal Formulation: With patient Time For Goal Achievement: 02/03/13 Potential to Achieve Goals: Good ADL Goals Pt Will Perform Lower Body Dressing: with modified independence;Sit to stand from chair;Unsupported;with adaptive equipment ADL Goal: Lower Body Dressing - Progress: Goal set today Miscellaneous OT Goals Miscellaneous OT Goal #1: Pt will verbalize 3 EC techniques for adls  OT Goal: Miscellaneous Goal #1 - Progress: Goal set today  Visit Information  Last OT Received On: 01/20/13 Assistance Needed: +1 PT/OT Co-Evaluation/Treatment: Yes    Subjective Data  Subjective: "I am semi retired."-  Patient Stated Goal: to return to work if I can   Prior Functioning     Home Living Lives With: Significant other Available Help at Discharge: Family;Friend(s);Available PRN/intermittently;Other (Comment) (girlfriend and family work) Type of Home: House Home Access: Stairs to enter Secretary/administrator of Steps: 3 Entrance Stairs-Rails: Can reach both Home Layout: One level Bathroom Shower/Tub: Engineer, manufacturing systems: Standard Home Adaptive Equipment: None Additional Comments: Family states they might be able to borrow a RW and cane. Prior Function Level of Independence: Independent Able to Take Stairs?: Yes Driving: No Vocation: On disability Communication Communication: No difficulties         Vision/Perception Vision - History Baseline Vision: No visual deficits Patient Visual Report: No change from baseline   Cognition  Cognition Overall Cognitive Status: Appears within functional limits for tasks assessed/performed Arousal/Alertness: Awake/alert Orientation Level: Appears intact for  tasks assessed Behavior During Session: [facility] for tasks performed    Extremity/Trunk Assessment Right Upper Extremity Assessment RUE ROM/Strength/Tone: Correct Care Of  for tasks assessed RUE Coordination: WFL - gross/fine motor  Left Upper Extremity Assessment LUE ROM/Strength/Tone: WFL for tasks assessed LUE Coordination: WFL - gross/fine motor Trunk Assessment Trunk Assessment: Normal     Mobility Bed Mobility Bed Mobility: Not assessed Transfers Transfers: Sit to Stand;Stand to Sit Sit to Stand: With upper extremity assist;4: Min guard;From chair/3-in-1 Stand to Sit: 4: Min guard;With upper extremity assist;To chair/3-in-1 Details for Transfer Assistance: Pt requires extended time and completed transfer without physcial (A)     Exercise     Balance Balance Balance Assessed: Yes Static Standing Balance Static Standing - Balance Support: No upper extremity supported;During functional activity Static Standing - Level of Assistance: 6: Modified independent (Device/Increase time) Static Standing - Comment/# of Minutes: ~1 minute   End of Session OT - End of Session Activity Tolerance: Patient tolerated treatment well Patient left: in chair;with call bell/phone within reach Nurse Communication: Mobility status;Precautions  GO     Lucile Shutters 01/20/2013, 11:03 AM Pager: [phone]

## 2013-01-21 LAB — GLUCOSE, CAPILLARY
Glucose-Capillary: 129 mg/dL — ABNORMAL HIGH (ref 70–99)
Glucose-Capillary: 189 mg/dL — ABNORMAL HIGH (ref 70–99)
Glucose-Capillary: 138 mg/dL — ABNORMAL HIGH (ref 70–99)

## 2013-01-21 LAB — CBC
HCT: 26.5 % — ABNORMAL LOW (ref 39.0–52.0)
Hemoglobin: 9.2 g/dL — ABNORMAL LOW (ref 13.0–17.0)
MCH: 32.2 pg (ref 26.0–34.0)
MCHC: 34.7 g/dL (ref 30.0–36.0)
MCV: 92.7 fL (ref 78.0–100.0)
Platelets: 186 10*3/uL (ref 150–400)
RBC: 2.86 MIL/uL — ABNORMAL LOW (ref 4.22–5.81)
RDW: 13.2 % (ref 11.5–15.5)
WBC: 10 10*3/uL (ref 4.0–10.5)

## 2013-01-21 MED ORDER — FREESTYLE LANCETS MISC: Status: DC

## 2013-01-21 MED ORDER — FREESTYLE SYSTEM KIT: 1.0000 | PACK | Status: DC | PRN

## 2013-01-21 MED ORDER — INSULIN GLARGINE 100 UNIT/ML ~~LOC~~ SOLN: 15.0000 [IU] | Freq: Every day | SUBCUTANEOUS | Status: DC

## 2013-01-21 MED ORDER — CYCLOBENZAPRINE HCL 10 MG PO TABS
10.0000 mg | ORAL_TABLET | Freq: Two times a day (BID) | ORAL | Status: DC | PRN
  Administered 2013-01-21: 10 mg via ORAL
  Filled 2013-01-21: qty 1

## 2013-01-21 MED ORDER — POTASSIUM CHLORIDE CRYS ER 20 MEQ PO TBCR
40.0000 meq | EXTENDED_RELEASE_TABLET | Freq: Two times a day (BID) | ORAL | Status: DC
  Administered 2013-01-21 (×2): 40 meq via ORAL
  Filled 2013-01-21: qty 1
  Filled 2013-01-21 (×4): qty 2

## 2013-01-21 MED ORDER — INSULIN PEN NEEDLE 31G X 8 MM MISC: Status: DC

## 2013-01-21 MED ORDER — OXYCODONE-ACETAMINOPHEN 5-325 MG PO TABS
1.0000 | ORAL_TABLET | Freq: Four times a day (QID) | ORAL | Status: DC | PRN
  Administered 2013-01-22: 2 via ORAL
  Filled 2013-01-21: qty 2

## 2013-01-21 MED ORDER — GLUCOSE BLOOD VI STRP: ORAL_STRIP | Status: DC

## 2013-01-21 NOTE — Progress Notes (Signed)
Wasted of fentanyl with RN A. Idowu in the sink, from patient's full dose PCA pump.

## 2013-01-21 NOTE — Progress Notes (Signed)
Fent PCA d/c at this time; 18ml Fent wasted with 2 RN; Janine Ores, RN verified waste; will cont. To monitor.

## 2013-01-21 NOTE — Progress Notes (Signed)
Patient used of fentanyl at his 0400 documentation check. Will continue to monitor.

## 2013-01-21 NOTE — Progress Notes (Signed)
Occupational Therapy Treatment Patient Details Name: Ryan [name] MRN: [id number] DOB: Jun 25, 1966 Today's Date: 01/21/2013 Time: 4098-1191 OT Time Calculation (min): 24 min  OT Assessment / Plan / Recommendation Comments on Treatment Session Making good progress. Issued AE to assist with LB ADL. Used teach back method for education. All further OT to be addressed by [name]. Ready to D/C in am from OT standpoint.    Follow Up Recommendations  Home health OT    Barriers to Discharge       Equipment Recommendations  None recommended by OT    Recommendations for Other Services    Frequency Min 2X/week   Plan Discharge plan remains appropriate    Precautions / Restrictions Precautions Precautions: None   Pertinent Vitals/Pain 7. Abdomen. Premedicated     ADL  Lower Body Bathing: Supervision/safety Where Assessed - Lower Body Bathing: Unsupported sit to stand Lower Body Dressing: Supervision/safety;Set up Where Assessed - Lower Body Dressing: Unsupported sit to stand Toilet Transfer: Modified independent Toilet Transfer Method: Sit to stand Toilet Transfer Equipment: Bedside commode Toileting - Clothing Manipulation and Hygiene: Modified independent Where Assessed - Engineer, mining and Hygiene: Standing Equipment Used: Rolling walker;Reacher;Long-handled sponge;Sock aid Transfers/Ambulation Related to ADLs: mod ! ADL Comments: Increaed independence with AE. Issued AE due to financial constraints    OT Diagnosis:    OT Problem List:   OT Treatment Interventions:     OT Goals Acute Rehab OT Goals OT Goal Formulation: With patient Time For Goal Achievement: 02/03/13 Potential to Achieve Goals: Good ADL Goals Pt Will Perform Lower Body Dressing: with modified independence;Sit to stand from chair;Unsupported;with adaptive equipment ADL Goal: Lower Body Dressing - Progress: Progressing toward goals Miscellaneous OT Goals Miscellaneous OT Goal #1: Pt will  verbalize 3 EC techniques for adls  OT Goal: Miscellaneous Goal #1 - Progress: Progressing toward goals  Visit Information  Last OT Received On: 01/21/13 Assistance Needed: +1    Subjective Data      Prior Functioning       Cognition       Mobility  Bed Mobility Bed Mobility: Supine to Sit;Sitting - Scoot to Edge of Bed Supine to Sit: 6: Modified independent (Device/Increase time);HOB elevated Sitting - Scoot to Edge of Bed: 6: Modified independent (Device/Increase time);With rail Transfers Transfers: Sit to Stand;Stand to Sit Sit to Stand: 6: Modified independent (Device/Increase time);With upper extremity assist;From bed Stand to Sit: 6: Modified independent (Device/Increase time);To chair/3-in-1;With upper extremity assist Details for Transfer Assistance: good safety    Exercises      Balance     End of Session OT - End of Session Activity Tolerance: Patient tolerated treatment well Patient left: Other (comment) (on toilet) Nurse Communication: Mobility status;Precautions  GO     Ryan [name],Ryan [name] 01/21/2013, 5:21 PM [facility], OTR/L  [phone] 01/21/2013

## 2013-01-21 NOTE — Progress Notes (Addendum)
[facility] [place]  Post-op  Intra-abdominal Surgery note  Date of Surgery: 01/16/2013  Surgeon(s): Nada Libman, MD Fransisco Hertz, MD Pryor Ochoa, MD  5 Days Post-Op Procedure(s): AORTA BIFEMORAL BYPASS GRAFT  History of Present Illness  Ryan [name] is a 47 y.o. male who is  up s/p Procedure(s): AORTA BIFEMORAL BYPASS GRAFT Pt is doing well. complains of incisional pain; denies nausea/vomiting; denies diarrhea. has had flatus;has had BM  IMAGING: No results found.  Significant Diagnostic Studies: CBC Lab Results  Component Value Date   WBC 10.0 01/21/2013   HGB 9.2* 01/21/2013   HCT 26.5* 01/21/2013   MCV 92.7 01/21/2013   PLT 186 01/21/2013    BMET    Component Value Date/Time   NA 136 01/20/2013 1304   K 2.9* 01/20/2013 1304   CL 100 01/20/2013 1304   CO2 27 01/20/2013 1304   GLUCOSE 128* 01/20/2013 1304   BUN 13 01/20/2013 1304   CREATININE 0.93 01/20/2013 1304   CALCIUM 8.0* 01/20/2013 1304   GFRNONAA >90 01/20/2013 1304   GFRAA >90 01/20/2013 1304    COAG Lab Results  Component Value Date   INR 1.15 01/16/2013   INR 0.91 01/08/2013   INR 0.9 01/24/2012   No results found for this basename: PTT    I/O last 3 completed shifts: In: 1210 [P.O.:800; I.V.:410] Out: 3400 [Urine:3400]    Physical Examination BP Readings from Last 3 Encounters:  01/21/13 110/69  01/21/13 110/69  01/08/13 148/89   Temp Readings from Last 3 Encounters:  01/21/13 98.2 F (36.8 C) Oral  01/21/13 98.2 F (36.8 C) Oral  01/08/13 98.1 F (36.7 C)    SpO2 Readings from Last 3 Encounters:  01/21/13 95%  01/21/13 95%  01/08/13 96%   Pulse Readings from Last 3 Encounters:  01/21/13 89  01/21/13 89  01/08/13 72    General: A&O x 3, WDWN male in NAD Pulmonary: normal non-labored breathing , without Rales, rhonchi,  wheezing Cardiac: Heart rate : regular ,  Abdomen:abdomen soft Abdominal wound:clean, dry, intact  Neurologic: A&O X 3; Appropriate  Affect ; SENSATION: normal; MOTOR FUNCTION:  moving all extremities equally. Speech is fluent/normal Scrotal swelling Vascular Exam:BLE warm and well perfused Extremities without ischemic changes, no Gangrene, no cellulitis; no open wounds;        Assessment/Plan: Ryan [name] is a 47 y.o. male who is 5 Days Post-Op Procedure(s): AORTA BIFEMORAL BYPASS GRAFT Taking PO's well, now on PO pain medication. Scrotal swelling has decreased     Clinton Gallant [facility] [phone] 01/21/2013 8:27 AM     Agree with above Will replace K+ D/C in am   WElls Poonam Woehrle

## 2013-01-21 NOTE — Progress Notes (Signed)
[facility] Medical Consultation  Ryan [name] ZOX:[id number] DOB: [birth year] DOA: 01/16/2013 PCP: [name], MD   Requesting physician: Dr. Imogene Burn Date of consultation: 01/18/13 Reason for consultation: Assistance with diabetes management.  Impression/Recommendations 1-PVD: per primary team. S/P ABF BPG; improving. Denies CP, significant abdominal pain or SOB.  2-Diabetes: no taking any medications at home per review of his home meds. A1C 9.1 demonstrating poor control. Off insulin drip and with good control with lantus and SSI. In order to avoid SSI at home will discharge on lantus 15 units QHS and he will follow with PCP for further medication adjustments.  3-HLD: continue statins.   4-GERD: continue PPI  5-post operative ileus: passing flatus and having BM's; currently w/o nausea or vomiting and tolerating diet.   6-Tachycardia: continue B-blocker; TSH WNL  Thank you for this consultation. Will now sign off; please call with questions; prescriptions for glucometer, lancets, strips and lantus pen plance inside the chart.  Scheduled Meds: . aspirin  81 mg Oral Daily  . carvedilol  6.25 mg Oral BID WC  . enalapril  5 mg Oral BID  . enoxaparin (LOVENOX) injection  40 mg Subcutaneous Q24H  . insulin aspart  0-15 Units Subcutaneous TID WC  . insulin glargine  12 Units Subcutaneous QHS  . neomycin-bacitracin-polymyxin   Topical TID  . pantoprazole  40 mg Oral Daily  . potassium chloride  40 mEq Oral BID  . simvastatin  40 mg Oral QPM  . spironolactone  6.5 mg Oral Daily   Continuous Infusions:   PRN Meds:.acetaminophen, acetaminophen, cyclobenzaprine, diphenhydrAMINE, diphenhydrAMINE, hydrALAZINE, magnesium sulfate 1 - 4 g bolus IVPB, metoprolol, naloxone, nitroGLYCERIN, ondansetron, oxyCODONE, oxyCODONE-acetaminophen, phenol, potassium chloride, sodium chloride   Physical Exam: Blood pressure 141/76, pulse 93, temperature 98.2 F (36.8 C), temperature source  Oral, resp. rate 26, height 5\' 8"  (1.727 m), weight 100.2 kg (220 lb 14.4 oz), SpO2 95.00%. Filed Vitals:   01/21/13 0148 01/21/13 0352 01/21/13 0800 01/21/13 1017  BP:  110/69  141/76  Pulse:  89  93  Temp:  98.2 F (36.8 C)    TempSrc:  Oral    Resp: 13 16 26    Height:      Weight:      SpO2: 95% 97% 95%      General:  Complaining of pain in his abdomen and tights; afebrile; ngt off and tolerating diet.  Eyes: no icterus, no nystagmus, PERRL, EOMI  ENT: dry MM, ngt in place, no drainage out or ears or nostrils; no erythema or exudates  Neck: supple, no thyromegaly  Cardiovascular: tachycardic, no rubs or gallops  Respiratory: no wheezing or crackles  Abdomen: sore in the incision area; but wound is clean and w/o drainage; decrease but positive BS, mild distension; passing flatus and had multiple BM's  Musculoskeletal: no joint swelling or erythema; positive edema on his LE; 2++  Psychiatric: no hallucinations; normal mood  Neurologic: no focal deficit.  Labs on Admission:  Basic Metabolic Panel:  Recent Labs Lab 01/16/13 1605  01/17/13 0410 01/18/13 0430 01/19/13 0450 01/20/13 0827 01/20/13 1304  NA 137  < > 141 146* 135 140 136  K 5.7*  < > 3.6 3.4* 3.1* 3.1* 2.9*  CL 105  < > 110 109 102 103 100  CO2 21  --  20 26 23 24 27   GLUCOSE 293*  < > 140* 173* 167* 144* 128*  BUN 20  < > 21 17 14 14 13   CREATININE 1.45*  < >  1.59* 1.22 1.01 0.92 0.93  CALCIUM 6.6*  --  6.7* 7.1* 7.2* 8.1* 8.0*  MG 1.4*  --  1.3*  --   --   --   --   < > = values in this interval not displayed. Liver Function Tests:  Recent Labs Lab 01/17/13 0410  AST 67*  ALT 57*  ALKPHOS 36*  BILITOT 0.7  PROT 4.8*  ALBUMIN 2.7*    Recent Labs Lab 01/17/13 0410  AMYLASE 42   CBC:  Recent Labs Lab 01/16/13 1605  01/17/13 0410 01/18/13 0430 01/18/13 1800 01/19/13 0450 01/21/13 0620  WBC 25.0*  --  19.0* 17.1*  --  12.9* 10.0  HGB 14.2  < > 12.6* 10.2* 9.2* 8.9* 9.2*  HCT  41.1  < > 37.0* 29.7* 27.0* 26.2* 26.5*  MCV 93.8  --  92.5 93.1  --  94.6 92.7  PLT 130*  --  109* 97*  --  99* 186  < > = values in this interval not displayed. CBG:  Recent Labs Lab 01/20/13 1250 01/20/13 1700 01/20/13 2102 01/21/13 0627 01/21/13 1200  GLUCAP 135* 122* 155* 129* 189*    Radiological Exams on Admission: No results found.  Time spent: < 30 minutes  Allyne Hebert [facility] Pager [phone]  If 7PM-7AM, please contact night-coverage [email or web address] Password [facility] 01/21/2013, 12:50 PM

## 2013-01-22 LAB — GLUCOSE, CAPILLARY: Glucose-Capillary: 159 mg/dL — ABNORMAL HIGH (ref 70–99)

## 2013-01-22 LAB — BASIC METABOLIC PANEL
BUN: 10 mg/dL (ref 6–23)
Calcium: 8.1 mg/dL — ABNORMAL LOW (ref 8.4–10.5)
Chloride: 102 mEq/L (ref 96–112)
Creatinine, Ser: 0.95 mg/dL (ref 0.50–1.35)
GFR calc Af Amer: >90 mL/min (ref >90)

## 2013-01-22 MED ORDER — OXYCODONE-ACETAMINOPHEN 5-325 MG PO TABS: ORAL_TABLET | ORAL | Status: DC

## 2013-01-22 MED ORDER — POTASSIUM CHLORIDE CRYS ER 20 MEQ PO TBCR
40.0000 meq | EXTENDED_RELEASE_TABLET | Freq: Two times a day (BID) | ORAL | Status: DC
  Administered 2013-01-22: 40 meq via ORAL

## 2013-01-22 MED ORDER — POTASSIUM CHLORIDE CRYS ER 20 MEQ PO TBCR
40.0000 meq | EXTENDED_RELEASE_TABLET | Freq: Once | ORAL | Status: AC
  Administered 2013-01-22: 40 meq via ORAL
  Filled 2013-01-22: qty 2

## 2013-01-22 NOTE — Discharge Summary (Signed)
[facility] Discharge Summary  Ryan [name] 04-11-1966 47 y.o. male  [id number]  Admission Date: 01/16/2013  Discharge Date: 01/22/13  Physician: Nada Libman, MD  Admission Diagnosis: Peripheral Arterial Occlusive Disease   HPI:   This is a 46 y.o. male is back today for followup. I initially met him in March of 2013. He suffered a MI in January 2013, and at that time he had a drug-eluting stent placed to his LAD. During this process, an abdominal aortogram revealed aortic occlusion at the level of the renal arteries. He also has a left subclavian artery occlusion. Because of his drug-eluting stent, I have not been able to proceed with surgical revascularization, because he has not been able to come off of his antiplatelet medication. He most recently saw Dr. Sanjuana Kava who stopped his Effient. The patient reports left arm numbness and weakness with activity. He does not have any neurologic symptoms. He suffers left greater than right leg claudication at 20-30 feet. He denies nonhealing wounds or rest pain.  He continues to be medically managed for his hypertension. He is on a statin for his hypercholesterolemia. He continues to smoke but has cut back to 6 cigarettes per day.   Hospital Course:  The patient was admitted to the hospital and taken to the operating room on 01/16/2013 and underwent  Aortobifemoral bypass graft with 14 x 7 dacryon    The pt tolerated the procedure well and was transported to the PACU in good condition. He remained intubated overnight and was extubated POD 1.  He did require levophed, but this was weaned successfully.  His creatinine was improving by POD 2 and his foley was discontinued.  He did have acute surgical blood loss anemia, but was asymptomatic with exception of tachycardia.  He remained in ICU.  IM consult was obtained to help manage his DM.  He was transferred to 3300 on POD 3.  His H/H was stable.  Pt did have eschar to left  nares from tape from the NGT.   A WOC consult was obtained and it was recommended that he continue neosporin ointment to promote moist healing.  His acute renal insufficiency resolved.  He did have hypokalemia and this was treated.  The remainder of the hospital course consisted of increasing mobilization and increasing intake of solids without difficulty.  CBC    Component Value Date/Time   WBC 10.0 01/21/2013 0620   RBC 2.86* 01/21/2013 0620   HGB 9.2* 01/21/2013 0620   HCT 26.5* 01/21/2013 0620   PLT 186 01/21/2013 0620   MCV 92.7 01/21/2013 0620   MCH 32.2 01/21/2013 0620   MCHC 34.7 01/21/2013 0620   RDW 13.2 01/21/2013 0620   LYMPHSABS 4.5* 08/29/2012 1447   MONOABS 1.0 08/29/2012 1447   EOSABS 0.3 08/29/2012 1447   BASOSABS 0.2* 08/29/2012 1447    BMET    Component Value Date/Time   NA 137 01/22/2013 0520   K 3.2* 01/22/2013 0520   CL 102 01/22/2013 0520   CO2 26 01/22/2013 0520   GLUCOSE 148* 01/22/2013 0520   BUN 10 01/22/2013 0520   CREATININE 0.95 01/22/2013 0520   CALCIUM 8.1* 01/22/2013 0520   GFRNONAA >90 01/22/2013 0520   GFRAA >90 01/22/2013 0520     Discharge Instructions:   The patient is discharged to home with extensive instructions on wound care and progressive ambulation.  They are instructed not to drive or perform any heavy lifting until returning to see the physician in  his office.  Discharge Orders   Future Appointments Provider Department Dept Phone   02/17/2013 1:00 PM [facility] [phone]   02/17/2013 1:30 PM Nada Libman, MD [facility] [phone]   02/20/2013 9:00 AM [facility] [phone]   Future Orders Complete By Expires     ABDOMINAL PROCEDURE/ANEURYSM REPAIR/AORTO-BIFEMORAL BYPASS:  Call MD for increased abdominal pain; cramping diarrhea; nausea/vomiting  As directed     Call MD for:  redness, tenderness, or signs  of infection (pain, swelling, bleeding, redness, odor or green/yellow discharge around incision site)  As directed     Call MD for:  severe or increased pain, loss or decreased feeling  in affected limb(s)  As directed     Call MD for:  temperature >100.5  As directed     Driving Restrictions  As directed     Comments:      No driving for 4 weeks    Increase activity slowly  As directed     Comments:      Walk with assistance use walker or cane as needed    Lifting restrictions  As directed     Comments:      No lifting for 6 weeks    May shower   As directed     No dressing needed  As directed     Resume previous diet  As directed     may wash over wound with mild soap and water  As directed        Discharge Diagnosis:  Peripheral Arterial Occlusive Disease  Secondary Diagnosis: Patient Active Problem List  Diagnosis  . Tobacco abuse  . Coronary atherosclerosis of native coronary artery  . Acute myocardial infarction of other anterior wall, subsequent episode of care  . Mixed hyperlipidemia  . Cardiomyopathy secondary  . Claudication  . PAD (peripheral artery disease)  . Ischemic cardiomyopathy  . Chronic total occlusion of artery of the extremities  . Occlusion and stenosis of carotid artery without mention of cerebral infarction  . Peripheral vascular disease, unspecified   Past Medical History  Diagnosis Date  . MI (myocardial infarction)     AMI 1/13 - complicated by VT/Tosades  . Chronic back pain   . Anal fissure   . Rectal polyp   . Mixed hyperlipidemia   . PAD (peripheral artery disease)   . Hypertension     "patient states does not have primary doctor"  . Kidney stones   . GERD (gastroesophageal reflux disease)     "takes tums"  . Chronic low back pain     hx of  . Coronary atherosclerosis of native coronary artery     DES LAD 1/13, LVEF 35%, sees Dr. Sanjuana Kava  . Diabetes mellitus without complication     "borderline diabetic"      Medication  List    TAKE these medications       aspirin 81 MG tablet  Take 162 mg by mouth daily.     carvedilol 6.25 MG tablet  Commonly known as:  COREG  Take 1 tablet (6.25 mg total) by mouth 2 (two) times daily with a meal.     cyclobenzaprine 10 MG tablet  Commonly known as:  FLEXERIL  Take 1 tablet (10 mg total) by mouth 2 (two) times daily as needed for muscle spasms.     enalapril 5 MG tablet  Commonly known as:  VASOTEC  Take 1 tablet (5 mg total) by mouth 2 (two) times daily.     freestyle lancets  Use as instructed     glucose blood test strip  Use to check blood sugar three time a day (fasting, at lunch time and a bedtime; before injecting lantus)     glucose monitoring kit monitoring kit  1 each by Does not apply route as needed for other.     insulin glargine 100 UNIT/ML injection  Commonly known as:  LANTUS  Inject 15 Units into the skin at bedtime.     Insulin Pen Needle 31G X 8 MM Misc  Commonly known as:  1ST CHOICE PEN NEEDLES  Use to inject lantus at bedtime as instructed     nitroGLYCERIN 0.4 MG SL tablet  Commonly known as:  NITROSTAT  Place 1 tablet (0.4 mg total) under the tongue every 5 (five) minutes as needed. For chest pain     oxyCODONE-acetaminophen 5-325 MG per tablet  Commonly known as:  ROXICET  Take 1-2 tablets by mouth every 6 hours as needed for pain     simvastatin 40 MG tablet  Commonly known as:  ZOCOR  Take 1 tablet (40 mg total) by mouth every evening.     spironolactone 25 MG tablet  Commonly known as:  ALDACTONE  Take 0.5 tablets (12.5 mg total) by mouth daily.       #30 NR of roxicet given  Disposition: home with [facility] PT/OT  Patient's condition: is Good  Follow up: 1. Dr. Myra Gianotti in 2 weeks   Doreatha Massed, PA-C [facility] [phone] 01/22/2013  7:48 AM   Agree with above Ready for d/c   Durene Cal

## 2013-01-22 NOTE — Progress Notes (Addendum)
[facility] Progress Note  01/22/2013 7:40 AM POD 6  Subjective:  States he is sore  Tm 99 now afebrile VSS  94% RA  Filed Vitals:   01/22/13 0424  BP: 126/75  Pulse: 85  Temp: 97.2 F (36.2 C)  Resp: 18     Physical Exam: Cardiac:  regular Lungs:  CTAB Abdomen:  Soft; NT/ND +BS; +BM Incisions:  Bilateral groins and laparotomy incisions are c/d/i Extremities:  + palpable DP bilaterally; bilateral feet are warm and well perfused.  CBC    Component Value Date/Time   WBC 10.0 01/21/2013 0620   RBC 2.86* 01/21/2013 0620   HGB 9.2* 01/21/2013 0620   HCT 26.5* 01/21/2013 0620   PLT 186 01/21/2013 0620   MCV 92.7 01/21/2013 0620   MCH 32.2 01/21/2013 0620   MCHC 34.7 01/21/2013 0620   RDW 13.2 01/21/2013 0620   LYMPHSABS 4.5* 08/29/2012 1447   MONOABS 1.0 08/29/2012 1447   EOSABS 0.3 08/29/2012 1447   BASOSABS 0.2* 08/29/2012 1447    BMET    Component Value Date/Time   NA 137 01/22/2013 0520   K 3.2* 01/22/2013 0520   CL 102 01/22/2013 0520   CO2 26 01/22/2013 0520   GLUCOSE 148* 01/22/2013 0520   BUN 10 01/22/2013 0520   CREATININE 0.95 01/22/2013 0520   CALCIUM 8.1* 01/22/2013 0520   GFRNONAA >90 01/22/2013 0520   GFRAA >90 01/22/2013 0520    INR    Component Value Date/Time   INR 1.15 01/16/2013 1605     Intake/Output Summary (Last 24 hours) at 01/22/13 0740 Last data filed at 01/22/13 0643  Gross per 24 hour  Intake    840 ml  Output   1750 ml  Net   -910 ml     Assessment/Plan:  47 y.o. male is s/p Aortobifemoral bypass graft with 14 x 7 dacryon  POD 6  -hypokalemia slightly improved-will give dose of K+ this am -pt doing well -discharge home today   Doreatha Massed, PA-C [facility] [phone] 01/22/2013 7:40 AM  Agree with above Ready for d/c will replace K+ prior to d/c   Durene Cal

## 2013-01-23 ENCOUNTER — Other Ambulatory Visit: Payer: Self-pay | Admitting: *Deleted

## 2013-01-23 MED ORDER — PRASUGREL HCL 10 MG PO TABS: 10.0000 mg | ORAL_TABLET | Freq: Every day | ORAL | Status: DC

## 2013-01-23 NOTE — Telephone Encounter (Signed)
Per note on file, pt is on his Effient 10mg  qd. Had to add to med list. Sent to [facility] in [place], [place]. Fax Received. Refill Completed. Azazel Franze Chowoe (R.M.A)

## 2013-01-27 ENCOUNTER — Telehealth: Payer: Self-pay

## 2013-01-27 DIAGNOSIS — G8918 Other acute postprocedural pain: Secondary | ICD-10-CM

## 2013-01-27 DIAGNOSIS — I739 Peripheral vascular disease, unspecified: Secondary | ICD-10-CM

## 2013-01-27 MED ORDER — OXYCODONE-ACETAMINOPHEN 5-325 MG PO TABS: ORAL_TABLET | ORAL | Status: DC

## 2013-01-27 NOTE — Telephone Encounter (Signed)
Phone call from pt.  Requests a refill on his pain medication.  States his incisional area look good, and the swelling in his bilat. groin has improved.  States had a slight fever a few days ago, but it has gone down, and he is continuing to monitor it.  Rates pain at 8/10.  States with pain medication in system, his pain level improves to a 4/10.  Discussed w/ Dr. Myra Gianotti.  Rec'd v.o. to give Rx for Percocet 5/325 mg 1-2 tabs q 6 hrs/ prn pain; # 50; no refills.

## 2013-02-03 ENCOUNTER — Other Ambulatory Visit: Payer: Self-pay | Admitting: *Deleted

## 2013-02-03 ENCOUNTER — Telehealth: Payer: Self-pay | Admitting: Cardiovascular Disease

## 2013-02-03 DIAGNOSIS — Z48812 Encounter for surgical aftercare following surgery on the circulatory system: Secondary | ICD-10-CM

## 2013-02-03 DIAGNOSIS — G8918 Other acute postprocedural pain: Secondary | ICD-10-CM

## 2013-02-03 DIAGNOSIS — I739 Peripheral vascular disease, unspecified: Secondary | ICD-10-CM

## 2013-02-03 MED ORDER — OXYCODONE-ACETAMINOPHEN 5-325 MG PO TABS: 2.0000 | ORAL_TABLET | ORAL | Status: DC | PRN

## 2013-02-03 NOTE — Telephone Encounter (Signed)
Spoke with Joyce Gross at Dr. Estanislado Spire office and told her our office filled prescription prior to surgery for Percocet.  Pt has now had surgery and our office will no longer refill this medication.

## 2013-02-03 NOTE — Telephone Encounter (Signed)
New problem    Regarding his pain medication between both office

## 2013-02-05 ENCOUNTER — Encounter (HOSPITAL_COMMUNITY): Payer: Self-pay | Admitting: *Deleted

## 2013-02-05 ENCOUNTER — Emergency Department (HOSPITAL_COMMUNITY)
Admission: EM | Admit: 2013-02-05 | Discharge: 2013-02-05 | Disposition: A | Discharge status: Home / Self Care | Payer: Medicaid Other | Source: Home / Self Care

## 2013-02-05 DIAGNOSIS — IMO0001 Reserved for inherently not codable concepts without codable children: Secondary | ICD-10-CM

## 2013-02-05 MED ORDER — INSULIN LISPRO 100 UNIT/ML ~~LOC~~ SOLN: SUBCUTANEOUS | Status: DC

## 2013-02-05 MED ORDER — GLUCOSE BLOOD VI STRP: ORAL_STRIP | Status: DC

## 2013-02-05 MED ORDER — VARENICLINE TARTRATE 0.5 MG PO TABS: 0.5000 mg | ORAL_TABLET | Freq: Two times a day (BID) | ORAL | Status: DC

## 2013-02-05 MED ORDER — FREESTYLE LANCETS MISC: Status: DC

## 2013-02-05 MED ORDER — OXYCODONE-ACETAMINOPHEN 5-325 MG PO TABS: 1.0000 | ORAL_TABLET | Freq: Three times a day (TID) | ORAL | Status: DC | PRN

## 2013-02-05 MED ORDER — INSULIN GLARGINE 100 UNIT/ML ~~LOC~~ SOLN
20.0000 [IU] | Freq: Every day | SUBCUTANEOUS | Status: DC
Start: 2013-02-05 — End: 2013-02-19

## 2013-02-05 MED ORDER — INSULIN PEN NEEDLE 31G X 8 MM MISC: Status: DC

## 2013-02-05 NOTE — ED Notes (Signed)
Added lab work for later date per Dr. Doristine Church orders.

## 2013-02-05 NOTE — ED Notes (Signed)
Patient presents for follow up from hospital for abdomenal aneurism repair. Was discharged on Feb. 19th.

## 2013-02-05 NOTE — ED Provider Notes (Addendum)
Patient Demographics  Ryan [name], is a 47 y.o. male  ZOX:[id number]  WJX:[id number]  DOB - [birth year]  Chief Complaint  Patient presents with  . Follow-up        Subjective:   Ryan [name] today has, No headache, No chest pain, No abdominal pain - No Nausea, No new weakness tingling or numbness, No Cough - SOB.   Objective:    Filed Vitals:   02/05/13 1049  BP: 113/58  Pulse: 93  Temp: 98.6 F (37 C)  TempSrc: Oral  Resp: 16  SpO2: 100%     Exam  Awake Alert, Oriented X 3, No new F.N deficits, Normal affect [place].AT,PERRAL Supple Neck,No JVD, No cervical lymphadenopathy appriciated.  Symmetrical Chest wall movement, Good air movement bilaterally, CTAB RRR,No Gallops,Rubs or new Murmurs, No Parasternal Heave +ve B.Sounds, Abd Soft, Non tender, No organomegaly appriciated, No rebound - guarding or rigidity. Midline postop abdominal scar and bilateral inguinal scar healing well. No Cyanosis, Clubbing or edema, No new Rash or bruise ,    Data Review   CBC No results found for this basename: WBC, HGB, HCT, PLT, MCV, MCH, MCHC, RDW, NEUTRABS, LYMPHSABS, MONOABS, EOSABS, BASOSABS, BANDABS, BANDSABD,  in the last 168 hours  Chemistries   No results found for this basename: NA, K, CL, CO2, GLUCOSE, BUN, CREATININE, GFRCGP, CALCIUM, MG, AST, ALT, ALKPHOS, BILITOT,  in the last 168 hours ------------------------------------------------------------------------------------------------------------------ No results found for this basename: HGBA1C,  in the last 72 hours ------------------------------------------------------------------------------------------------------------------ No results found for this basename: CHOL, HDL, LDLCALC, TRIG, CHOLHDL, LDLDIRECT,  in the last 72 hours ------------------------------------------------------------------------------------------------------------------ No results found for this basename: TSH, T4TOTAL, FREET3, T3FREE, THYROIDAB,   in the last 72 hours ------------------------------------------------------------------------------------------------------------------ No results found for this basename: VITAMINB12, FOLATE, FERRITIN, TIBC, IRON, RETICCTPCT,  in the last 72 hours  Coagulation profile  No results found for this basename: INR, PROTIME,  in the last 168 hours     Prior to Admission medications   Medication Sig Start Date End Date Taking? Authorizing Provider  aspirin 81 MG tablet Take 162 mg by mouth daily.     Historical Provider, MD  carvedilol (COREG) 6.25 MG tablet Take 1 tablet (6.25 mg total) by mouth 2 (two) times daily with a meal. 07/08/12   Kathleene Hazel, MD  cyclobenzaprine (FLEXERIL) 10 MG tablet Take 1 tablet (10 mg total) by mouth 2 (two) times daily as needed for muscle spasms. 08/29/12   Tiffany Irine Seal, PA-C  enalapril (VASOTEC) 5 MG tablet Take 1 tablet (5 mg total) by mouth 2 (two) times daily. 07/08/12   Kathleene Hazel, MD  glucose blood test strip Use to check blood sugar three time a day (fasting, at lunch time and a bedtime; before injecting lantus) 02/05/13   Leroy Sea, MD  glucose monitoring kit (FREESTYLE) monitoring kit 1 each by Does not apply route as needed for other. 01/21/13   Vassie Loll, MD  insulin glargine (LANTUS) 100 UNIT/ML injection Inject 20 Units into the skin at bedtime. 02/05/13   Leroy Sea, MD  insulin lispro (HUMALOG) 100 UNIT/ML injection Before each meal 3 times a day, 140-199 - 2 units, 200-250 - 4 units, 251-299 - 6 units,  300-349 - 8 units,  350 or above 10 units.  Can dispense Humalog pen, if needed dispense syringes and needles. 02/05/13   Leroy Sea, MD  Insulin Pen Needle (1ST CHOICE PEN NEEDLES) 31G X 8 MM MISC Use to inject lantus at bedtime  as instructed 02/05/13   Leroy Sea, MD  Lancets (FREESTYLE) lancets Use as instructed 02/05/13   Leroy Sea, MD  nitroGLYCERIN (NITROSTAT) 0.4 MG SL tablet Place 1 tablet (0.4 mg  total) under the tongue every 5 (five) minutes as needed. For chest pain 01/14/13   Jodelle Gross, NP  oxyCODONE-acetaminophen (PERCOCET/ROXICET) 5-325 MG per tablet Take 1 tablet by mouth every 8 (eight) hours as needed for pain. 02/05/13   Leroy Sea, MD  oxyCODONE-acetaminophen (ROXICET) 5-325 MG per tablet Take 1-2 tablets by mouth every 6 hours as needed for pain 01/27/13   Nada Libman, MD  prasugrel (EFFIENT) 10 MG TABS Take 1 tablet (10 mg total) by mouth daily. 01/23/13   Kathleene Hazel, MD  simvastatin (ZOCOR) 40 MG tablet Take 1 tablet (40 mg total) by mouth every evening. 07/09/12   Kathleene Hazel, MD  spironolactone (ALDACTONE) 25 MG tablet Take 0.5 tablets (12.5 mg total) by mouth daily. 07/08/12   Kathleene Hazel, MD     Assessment & Plan   Patient here for postop hospital discharge followup, he was recently diagnosed with type 2 diabetes mellitus, at [facility] patient was admitted for Aortobifemoral bypass graft  surgery done by Dr. Myra Gianotti.  Diabetes mellitus type 2. Sugars running between 170 fasting and pre-meal between 150-160. This was a new diagnosis of diabetes mellitus type 2 his A1c was found to be 9.1.  Have increase his Lantus from 15-20 units, instructed him on q. a.c. at bedtime Accu-Cheks and to maintain a logbook of her next visit. Have also added low-dose Humalog sliding scale 3 meal insulin with instructions.   She will come back in am 2-3 week time frame for a one-hour history and physical. Note he will require referral to ophthalmology, GI for routine colonoscopy etc. at that time.  His been counseled again to quit smoking. He has been provided Chantix prescription for a month as he requested, instructed to stop taking it immediately if he gets depressed or has any suicidal thoughts or ideations. He denies any history of the same at this time.    Follow-up Information   Follow up with Your primary care provider in 2-3  weeks,. Schedule an appointment as soon as possible for a visit in 2 weeks. ( come back for one hour history and physical)        Leroy Sea M.D on 02/05/2013 at 11:05 AM  Leroy Sea, MD 02/05/13 1109  Leroy Sea, MD 02/05/13 1118

## 2013-02-14 ENCOUNTER — Encounter: Payer: Self-pay | Admitting: Surgery

## 2013-02-17 ENCOUNTER — Ambulatory Visit (INDEPENDENT_AMBULATORY_CARE_PROVIDER_SITE_OTHER): Payer: Medicaid Other | Admitting: Surgery

## 2013-02-17 ENCOUNTER — Encounter: Payer: Self-pay | Admitting: Surgery

## 2013-02-17 ENCOUNTER — Ambulatory Visit: Payer: Medicaid Other | Admitting: Surgery

## 2013-02-17 ENCOUNTER — Encounter (INDEPENDENT_AMBULATORY_CARE_PROVIDER_SITE_OTHER): Payer: Medicaid Other | Admitting: Vascular Surgery

## 2013-02-17 ENCOUNTER — Emergency Department (HOSPITAL_COMMUNITY)
Admission: EM | Admit: 2013-02-17 | Discharge: 2013-02-17 | Disposition: A | Discharge status: Home / Self Care | Payer: Medicaid Other | Source: Home / Self Care

## 2013-02-17 ENCOUNTER — Other Ambulatory Visit: Payer: Self-pay | Admitting: *Deleted

## 2013-02-17 ENCOUNTER — Other Ambulatory Visit: Payer: Self-pay

## 2013-02-17 VITALS — BP 118/60 | HR 80 | Ht 68.0 in | Wt 203.9 lb

## 2013-02-17 DIAGNOSIS — I739 Peripheral vascular disease, unspecified: Secondary | ICD-10-CM

## 2013-02-17 DIAGNOSIS — T814XXA Infection following a procedure, initial encounter: Secondary | ICD-10-CM

## 2013-02-17 DIAGNOSIS — Z48812 Encounter for surgical aftercare following surgery on the circulatory system: Secondary | ICD-10-CM

## 2013-02-17 DIAGNOSIS — G458 Other transient cerebral ischemic attacks and related syndromes: Secondary | ICD-10-CM

## 2013-02-17 LAB — BASIC METABOLIC PANEL
Calcium: 10 mg/dL (ref 8.4–10.5)
GFR calc non Af Amer: >90 mL/min (ref >90)
Glucose, Bld: 126 mg/dL — ABNORMAL HIGH (ref 70–99)
Sodium: 139 mEq/L (ref 135–145)

## 2013-02-17 MED ORDER — OXYCODONE-ACETAMINOPHEN 5-325 MG PO TABS: 1.0000 | ORAL_TABLET | Freq: Three times a day (TID) | ORAL | Status: DC | PRN

## 2013-02-17 MED ORDER — CEPHALEXIN 500 MG PO CAPS: 500.0000 mg | ORAL_CAPSULE | Freq: Three times a day (TID) | ORAL | Status: DC

## 2013-02-17 NOTE — Progress Notes (Signed)
The patient is back today for followup. He is status post aortobifemoral bypass graft using a 14 x 7 dacryon graft. This was performed on 01/16/2013. It was done for aortic occlusion with severe bilateral claudication. The patient is done very well since he has been home. He no longer has walking limitations.  His midline incision is well-healed. He has a palpable left posterior tibial artery and a triphasic right posterior tibial artery. Is a small amount of erythema over the left femoral incision. Both incisions are intact with the exception of one spot on the right in the midportion and one spot on the proximal left. Both of these open areas are less than 1 mm deep and approximately 2 mm in length.  I'm going to place the patient on 10 days' worth of Keflex. He will followup with me in 3 weeks. At that time we will consider left subclavian revascularization. I am ordering a CT angiogram of the neck to define his anatomy.

## 2013-02-17 NOTE — ED Notes (Signed)
Patient here for blood work  BMP/a1c

## 2013-02-18 LAB — HEMOGLOBIN A1C
Hgb A1c MFr Bld: 8 % — ABNORMAL HIGH (ref <5.7)
Mean Plasma Glucose: 183 mg/dL — ABNORMAL HIGH (ref <117)

## 2013-02-19 ENCOUNTER — Emergency Department (HOSPITAL_COMMUNITY)
Admission: EM | Admit: 2013-02-19 | Discharge: 2013-02-19 | Disposition: A | Discharge status: Home / Self Care | Payer: Medicaid Other | Source: Home / Self Care

## 2013-02-19 ENCOUNTER — Encounter (HOSPITAL_COMMUNITY): Payer: Self-pay

## 2013-02-19 ENCOUNTER — Telehealth: Payer: Self-pay | Admitting: Cardiovascular Disease

## 2013-02-19 DIAGNOSIS — I739 Peripheral vascular disease, unspecified: Secondary | ICD-10-CM

## 2013-02-19 MED ORDER — INSULIN GLARGINE 100 UNIT/ML ~~LOC~~ SOLN: 25.0000 [IU] | Freq: Every day | SUBCUTANEOUS | Status: DC

## 2013-02-19 NOTE — Telephone Encounter (Signed)
New Problem:    Patient called in returning your call regarding his recent lab results.  Please call back.

## 2013-02-19 NOTE — Telephone Encounter (Signed)
Spoke with pt and reviewed lab results with him. He is following up with [facility] for management of his diabetes and states he has appointment  there today.

## 2013-02-19 NOTE — ED Notes (Signed)
Follow up.

## 2013-02-19 NOTE — ED Notes (Signed)
Patient Demographics  Ryan [name], is a 47 y.o. male  ZOX:[id number]  [id number]  DOB - 02-08-1966  Chief Complaint  Patient presents with  . Follow-up        Subjective:   Ryan [name] today has, with history of is metastatic to, hypertension, smoking, PAD, recent bypass surgery done by Dr. Myra Gianotti, here for a routine followup visit,  Glycemic control blood pressure in good control is stable he maintains a logbook, he does have a postoperative on wound for which he is following with Dr. Myra Gianotti vascular surgeon. Denies any headache chest pain cough phlegm shortness of breath no fever chills.   Objective:    Filed Vitals:   02/19/13 1037  BP: 109/74  Pulse: 77  Temp: 98 F (36.7 C)  TempSrc: Oral  SpO2: 100%     Exam  Awake Alert, Oriented X 3, No new F.N deficits, Normal affect [place].AT,PERRAL Supple Neck,No JVD, No cervical lymphadenopathy appriciated.  Symmetrical Chest wall movement, Good air movement bilaterally, CTAB RRR,No Gallops,Rubs or new Murmurs, No Parasternal Heave +ve B.Sounds, Abd Soft, Non tender, No organomegaly appriciated, No rebound - guarding or rigidity. No Cyanosis, Clubbing or edema, No new Rash or bruise   Right inguinal post incision site has 2 small openings, mild cellulitis     Data Review   CBC No results found for this basename: WBC, HGB, HCT, PLT, MCV, MCH, MCHC, RDW, NEUTRABS, LYMPHSABS, MONOABS, EOSABS, BASOSABS, BANDABS, BANDSABD,  in the last 168 hours  Chemistries    Recent Labs Lab 02/17/13 1040  NA 139  K 4.1  CL 100  CO2 28  GLUCOSE 126*  BUN 11  CREATININE 0.91  CALCIUM 10.0   ------------------------------------------------------------------------------------------------------------------  Recent Labs  02/17/13 1040  HGBA1C 8.0*   ------------------------------------------------------------------------------------------------------------------ No results found for this basename: CHOL, HDL, LDLCALC,  TRIG, CHOLHDL, LDLDIRECT,  in the last 72 hours ------------------------------------------------------------------------------------------------------------------ No results found for this basename: TSH, T4TOTAL, FREET3, T3FREE, THYROIDAB,  in the last 72 hours ------------------------------------------------------------------------------------------------------------------ No results found for this basename: VITAMINB12, FOLATE, FERRITIN, TIBC, IRON, RETICCTPCT,  in the last 72 hours  Coagulation profile  No results found for this basename: INR, PROTIME,  in the last 168 hours     Prior to Admission medications   Medication Sig Start Date End Date Taking? Authorizing Provider  aspirin 81 MG tablet Take 162 mg by mouth daily.     Historical Provider, MD  carvedilol (COREG) 6.25 MG tablet Take 1 tablet (6.25 mg total) by mouth 2 (two) times daily with a meal. 07/08/12   Kathleene Hazel, MD  cephALEXin (KEFLEX) 500 MG capsule Take 1 capsule (500 mg total) by mouth 3 (three) times daily. 02/17/13   Nada Libman, MD  cyclobenzaprine (FLEXERIL) 10 MG tablet Take 1 tablet (10 mg total) by mouth 2 (two) times daily as needed for muscle spasms. 08/29/12   Tiffany Irine Seal, PA-C  enalapril (VASOTEC) 5 MG tablet Take 1 tablet (5 mg total) by mouth 2 (two) times daily. 07/08/12   Kathleene Hazel, MD  glucose blood test strip Use to check blood sugar three time a day (fasting, at lunch time and a bedtime; before injecting lantus) 02/05/13   Leroy Sea, MD  glucose monitoring kit (FREESTYLE) monitoring kit 1 each by Does not apply route as needed for other. 01/21/13   Vassie Loll, MD  insulin glargine (LANTUS) 100 UNIT/ML injection Inject 20 Units into the skin at bedtime. 02/05/13   Leroy Sea, MD  insulin lispro (HUMALOG) 100 UNIT/ML injection Before each meal 3 times a day, 140-199 - 2 units, 200-250 - 4 units, 251-299 - 6 units,  300-349 - 8 units,  350 or above 10 units.  Can  dispense Humalog pen, if needed dispense syringes and needles. 02/05/13   Leroy Sea, MD  Insulin Pen Needle (1ST CHOICE PEN NEEDLES) 31G X 8 MM MISC Use to inject lantus at bedtime as instructed 02/05/13   Leroy Sea, MD  Lancets (FREESTYLE) lancets Use as instructed 02/05/13   Leroy Sea, MD  nitroGLYCERIN (NITROSTAT) 0.4 MG SL tablet Place 1 tablet (0.4 mg total) under the tongue every 5 (five) minutes as needed. For chest pain 01/14/13   Jodelle Gross, NP  oxyCODONE-acetaminophen (PERCOCET/ROXICET) 5-325 MG per tablet Take 1 tablet by mouth every 8 (eight) hours as needed for pain. 02/17/13   Dorothea Ogle, MD  prasugrel (EFFIENT) 10 MG TABS Take 1 tablet (10 mg total) by mouth daily. 01/23/13   Kathleene Hazel, MD  simvastatin (ZOCOR) 40 MG tablet Take 1 tablet (40 mg total) by mouth every evening. 07/09/12   Kathleene Hazel, MD  spironolactone (ALDACTONE) 25 MG tablet Take 0.5 tablets (12.5 mg total) by mouth daily. 07/08/12   Kathleene Hazel, MD  varenicline (CHANTIX) 0.5 MG tablet Take 1 tablet (0.5 mg total) by mouth 2 (two) times daily. 02/05/13   Leroy Sea, MD     Assessment & Plan   1.diabetes mellitus type 2. A1c is 8. Glycemic control is slightly elevated at times on his logbook advice to take low carb diet, we'll increase his Lantus to 25 units. Continue sliding scale insulin and maintaining logbook. he will come back in 4 weeks with his logbook.    2.hypertension stable in good control continue present medications    3. Smoking is on Chantix, he continues to smoke, recounseled to quit   4. Recent femoral surgery done by vascular surgeon Dr. Noralyn Pick inguinal postop wound, following with him, advice to follow with him in one week and continue to take his oral antibiotics given by vascular surgeon, advised to keep wound clean and dry.   5. Patient is history of colonic polyps have requested him to follow with the GI physician in  [place] in 2 weeks and check about his followup colonoscopy.   6. Have referred him outpatient  EYE physician for routine ophthalmologic exam since he is diabetic.    Follow-up Information   Schedule an appointment as soon as possible for a visit in 1 month to follow up.      Follow up with MCALHANY,CHRISTOPHER, MD.   Contact information:   [address] [place] [address] [phone]       Follow up with Basilio Cairo, Lala Lund, MD. Schedule an appointment as soon as possible for a visit in 1 week.   Contact information:   [address] [place] [address] [phone]       Follow up with this clinic . Schedule an appointment as soon as possible for a visit in 4 weeks.      Follow up with Your GI MD in 2 weeks for polyps.      Follow up with Lamarr Lulas, MD. Schedule an appointment as soon as possible for a visit in 1 month. (eye exam)    Contact information:   [address] [place] [address] [phone]  Leroy Sea M.D on 02/19/2013 at 10:52 AM   Leroy Sea, MD 02/19/13 1053

## 2013-02-20 ENCOUNTER — Encounter (INDEPENDENT_AMBULATORY_CARE_PROVIDER_SITE_OTHER): Payer: Medicaid Other

## 2013-02-20 DIAGNOSIS — R0989 Other specified symptoms and signs involving the circulatory and respiratory systems: Secondary | ICD-10-CM

## 2013-02-24 ENCOUNTER — Ambulatory Visit: Payer: Medicaid Other | Admitting: Surgery

## 2013-02-24 ENCOUNTER — Other Ambulatory Visit: Payer: Medicaid Other

## 2013-02-26 ENCOUNTER — Encounter (HOSPITAL_COMMUNITY): Payer: Self-pay | Admitting: *Deleted

## 2013-02-26 ENCOUNTER — Emergency Department (INDEPENDENT_AMBULATORY_CARE_PROVIDER_SITE_OTHER)
Admission: EM | Admit: 2013-02-26 | Discharge: 2013-02-26 | Disposition: A | Discharge status: Home / Self Care | Payer: Medicaid Other | Source: Home / Self Care

## 2013-02-26 DIAGNOSIS — G8929 Other chronic pain: Secondary | ICD-10-CM

## 2013-02-26 DIAGNOSIS — M549 Dorsalgia, unspecified: Secondary | ICD-10-CM

## 2013-02-26 DIAGNOSIS — I739 Peripheral vascular disease, unspecified: Secondary | ICD-10-CM

## 2013-02-26 MED ORDER — OXYCODONE-ACETAMINOPHEN 5-325 MG PO TABS: 1.0000 | ORAL_TABLET | Freq: Three times a day (TID) | ORAL | Status: DC | PRN

## 2013-02-26 NOTE — ED Provider Notes (Signed)
History     CSN: [id number]  Arrival date & time 02/26/13  1158   First MD Initiated Contact with Patient 02/26/13 1219      Chief Complaint  Patient presents with  . Medication Refill    (Consider location/radiation/quality/duration/timing/severity/associated sxs/prior treatment) HPI Patient is 47 year old male who presents for regular followup and needs refill on pain medicine. He explains he has extensive history of surgeries noted below and also has chronic back pain. He describes pain as dull and sharp, unpredictable in nature and located mostly in the lower back area, intermittent and 7/10 in severity when present. He describes that he experiences radiation of the pain from lower back to the bilateral lower extremities and has associated difficulty with ambulation. He also explains he cannot drive due to pain and requires assistance from family members. He denies fevers and chills, no other systemic symptoms, no numbness or tingling in lower extremities, no specific focal neurological weakness, no incontinence. Past Medical History  Diagnosis Date  . MI (myocardial infarction)     AMI 1/13 - complicated by VT/Tosades  . Chronic back pain   . Anal fissure   . Rectal polyp   . Mixed hyperlipidemia   . PAD (peripheral artery disease)   . Hypertension     "patient states does not have primary doctor"  . Kidney stones   . GERD (gastroesophageal reflux disease)     "takes tums"  . Chronic low back pain     hx of  . Coronary atherosclerosis of native coronary artery     DES LAD 1/13, LVEF 35%, sees Dr. Sanjuana Kava  . Diabetes mellitus without complication     "borderline diabetic"    Past Surgical History  Procedure Laterality Date  . Appendectomy    . Diagnostic laparoscopy    . Anal fissurectomy    . Coronary angioplasty      stent placed Dec 12, 2011  . Aorta - bilateral femoral artery bypass graft N/A 01/16/2013    Procedure: AORTA BIFEMORAL BYPASS GRAFT;  Surgeon: Nada Libman, MD;  Location: [facility] OR;  Service: Vascular;  Laterality: N/A;    Family History  Problem Relation Age of Onset  . Cancer Father     Lung  . Cancer Maternal Uncle     Colon  . Diabetes Mother   . COPD Mother     History  Substance Use Topics  . Smoking status: Current Every Day Smoker -- 0.50 packs/day for 25 years    Types: Cigarettes  . Smokeless tobacco: Never Used     Comment: pt states he is taking chantix  . Alcohol Use: 0.0 oz/week     Comment: Weekends      Review of Systems  Constitutional: Negative for fever, chills, diaphoresis, activity change, appetite change and fatigue.  HENT: Negative for ear pain, nosebleeds, congestion, facial swelling, rhinorrhea, neck pain, neck stiffness and ear discharge.   Eyes: Negative for pain, discharge, redness, itching and visual disturbance.  Respiratory: Negative for cough, choking, chest tightness, shortness of breath, wheezing and stridor.   Cardiovascular: Negative for chest pain, palpitations and leg swelling.  Gastrointestinal: Negative for abdominal distention.  Genitourinary: Negative for dysuria, urgency, frequency, hematuria, flank pain, decreased urine volume, difficulty urinating and dyspareunia.  Musculoskeletal: per HPI Neurological: Negative for dizziness, tremors, seizures, syncope, facial asymmetry, speech difficulty, weakness, light-headedness, numbness and headaches.  Hematological: Negative for adenopathy. Does not bruise/bleed easily.  Psychiatric/Behavioral: Negative for hallucinations, behavioral problems, confusion, dysphoric mood,  decreased concentration and agitation.    Allergies  Hydrocodone  Home Medications   Current Outpatient Rx  Name  Route  Sig  Dispense  Refill  . aspirin 81 MG tablet   Oral   Take 162 mg by mouth daily.          . carvedilol (COREG) 6.25 MG tablet   Oral   Take 1 tablet (6.25 mg total) by mouth 2 (two) times daily with a meal.   60 tablet   6   .  cephALEXin (KEFLEX) 500 MG capsule   Oral   Take 1 capsule (500 mg total) by mouth 3 (three) times daily.   30 capsule   0   . cyclobenzaprine (FLEXERIL) 10 MG tablet   Oral   Take 1 tablet (10 mg total) by mouth 2 (two) times daily as needed for muscle spasms.   20 tablet   0   . enalapril (VASOTEC) 5 MG tablet   Oral   Take 1 tablet (5 mg total) by mouth 2 (two) times daily.   60 tablet   6   . glucose blood test strip      Use to check blood sugar three time a day (fasting, at lunch time and a bedtime; before injecting lantus)   100 each   12   . glucose monitoring kit (FREESTYLE) monitoring kit   Does not apply   1 each by Does not apply route as needed for other.   1 each   0   . insulin glargine (LANTUS) 100 UNIT/ML injection   Subcutaneous   Inject 0.25 mLs (25 Units total) into the skin at bedtime.   3 mL   3   . insulin lispro (HUMALOG) 100 UNIT/ML injection      Before each meal 3 times a day, 140-199 - 2 units, 200-250 - 4 units, 251-299 - 6 units,  300-349 - 8 units,  350 or above 10 units.  Can dispense Humalog pen, if needed dispense syringes and needles.   10 mL   2   . Insulin Pen Needle (1ST CHOICE PEN NEEDLES) 31G X 8 MM MISC      Use to inject lantus at bedtime as instructed   100 each   1   . Lancets (FREESTYLE) lancets      Use as instructed   100 each   12   . nitroGLYCERIN (NITROSTAT) 0.4 MG SL tablet   Sublingual   Place 1 tablet (0.4 mg total) under the tongue every 5 (five) minutes as needed. For chest pain   25 tablet   3   . oxyCODONE-acetaminophen (PERCOCET/ROXICET) 5-325 MG per tablet   Oral   Take 1 tablet by mouth every 8 (eight) hours as needed for pain.   120 tablet   0   . prasugrel (EFFIENT) 10 MG TABS   Oral   Take 1 tablet (10 mg total) by mouth daily.   30 tablet   6   . simvastatin (ZOCOR) 40 MG tablet   Oral   Take 1 tablet (40 mg total) by mouth every evening.   30 tablet   5   . spironolactone  (ALDACTONE) 25 MG tablet   Oral   Take 0.5 tablets (12.5 mg total) by mouth daily.   30 tablet   6   . varenicline (CHANTIX) 0.5 MG tablet   Oral   Take 1 tablet (0.5 mg total) by mouth 2 (two) times daily.  30 tablet   0     BP 110/76  Pulse 98  Temp(Src) 97.7 F (36.5 C) (Oral)  SpO2 99%  Physical Exam  Constitutional: Appears well-developed and well-nourished. No distress.  HENT: Normocephalic. External right and left ear normal. Oropharynx is clear and moist.  Eyes: Conjunctivae and EOM are normal. PERRLA, no scleral icterus.  Neck: Normal ROM. Neck supple. No JVD. No tracheal deviation. No thyromegaly.  CVS: RRR, S1/S2 +, no murmurs, no gallops, no carotid bruit.  Pulmonary: Effort and breath sounds normal, no stridor, rhonchi, wheezes, rales.  Abdominal: Soft. BS +,  no distension, tenderness, rebound or guarding.  Musculoskeletal: Normal range of motion. Paraspinal tenderness in lumbar area noted, difficulty with extension and flexion of the back. Lymphadenopathy: No lymphadenopathy noted, cervical, inguinal. Neuro: Alert. Normal reflexes, muscle tone coordination. No cranial nerve deficit. Skin: Skin is warm and dry. No rash noted. Not diaphoretic. No erythema. No pallor.  Psychiatric: Normal mood and affect. Behavior, judgment, thought content normal.    ED Course  Procedures (including critical care time)  Labs Reviewed - No data to display No results found.   1. Claudication  - secondary to peripheral artery disease, likely combined sciatica, continue analgesia   2. PAD (peripheral artery disease) - continue aspirin, blood pressure and diabetes control, discussed smoking cessation in detail   3. Chronic back pain - symptoms consistent with spinal stenosis and potential sciatica, continue analgesia as needed       MDM  Chronic back pain, refill on pain medicine provided        Dorothea Ogle, MD 02/26/13 1229

## 2013-02-26 NOTE — ED Notes (Signed)
Pt would like refill for percocet 5/325.

## 2013-03-14 NOTE — Progress Notes (Signed)
The patient showed up at the front desk asking for another oxycodone prescription.  I looked at his chart and noted that he has just seen Dr. Lenise Arena on 3/26 and received 120 tablets of Percocet.  Instructions were to take one tablet every 8 hours when necessary pain.  That would've been a two-month supply of medication for him.  The patient reports that he's not taking it that way.  He reports that he's taking 2 tablets every 8 hours.  I explained to him that this is not a chronic pain management clinic.  He has a primary care provider.  He sees the [facility].    the patient has only been seen at this clinic twice.  he was seen here initially as a hospital followup.  I asked the patient who had been prescribing him these large doses of narcotic medications.  He says that his specialist had been doing that.  I told him that he needed to followup with the doctor who had been prescribing that medication.  I also offered to refer him to a pain management specialist.  The patient became upset and walked out.  Rodney Langton, MD, CDE, FAAFP [facility] [place], [place]

## 2013-03-14 NOTE — ED Notes (Signed)
Patient showed up in office for a refill of oxycodone- was recently prescribed to him on 3/26 take one tab every 8 hours- patientv verbalized he takes two pills every 8 hours- not following the prescribed orders.  Informed him he would need to follow up with pain management and he got up and walked out of facility

## 2013-03-24 ENCOUNTER — Emergency Department (INDEPENDENT_AMBULATORY_CARE_PROVIDER_SITE_OTHER)
Admission: EM | Admit: 2013-03-24 | Discharge: 2013-03-24 | Disposition: A | Discharge status: Home Health | Payer: Medicaid Other | Source: Home / Self Care

## 2013-03-24 ENCOUNTER — Encounter (HOSPITAL_COMMUNITY): Payer: Self-pay | Admitting: *Deleted

## 2013-03-24 DIAGNOSIS — I739 Peripheral vascular disease, unspecified: Secondary | ICD-10-CM

## 2013-03-24 DIAGNOSIS — F17200 Nicotine dependence, unspecified, uncomplicated: Secondary | ICD-10-CM

## 2013-03-24 DIAGNOSIS — Z720 Tobacco use: Secondary | ICD-10-CM

## 2013-03-24 MED ORDER — OXYCODONE-ACETAMINOPHEN 5-325 MG PO TABS: 1.0000 | ORAL_TABLET | Freq: Four times a day (QID) | ORAL | Status: DC | PRN

## 2013-03-24 NOTE — ED Provider Notes (Addendum)
History     CSN: [id number]  Arrival date & time 03/24/13  1229   First MD Initiated Contact with Patient 03/24/13 1309      Chief Complaint  Patient presents with  . Follow-up     HPI 47 year old male with history of MI in June 2013 s/p stenting, chronic back pain, diabetes mellitus with last A1c of 8, on insulin, hypertension, GERD, aortic occlusion and severe bilateral claudication status post aortofemoral bypass in 02/14. He also has a left subclavian artery occlusion for  which he is followed by Dr. Myra Gianotti and plan for CT angiogram of the neck next week. He presents today with symptoms of left ear pain and pain over his left palm with claudications that have been ongoing. He is out of his pain medications. He informs his lateral lower extremity pain that would have markedly improved but has some needles and pins sensation over his left foot. He denies any chest pain, palpitations, shortness of breath, abdominal pain, nausea, vomiting, headache, dizziness, bowel or urinary symptoms. He does complain of some blurry near vision, which seems to have resolved after using reading glasses. Denies any eye pain. Regarding his left ear pain patient denies any fever or discharge from his left ear. Denies any trauma. He continues to smoke half a pack per day and has not had much success with quitting smoking.  Past Medical History  Diagnosis Date  . MI (myocardial infarction)     AMI 1/13 - complicated by VT/Tosades  . Chronic back pain   . Anal fissure   . Rectal polyp   . Mixed hyperlipidemia   . PAD (peripheral artery disease)   . Hypertension     "patient states does not have primary doctor"  . Kidney stones   . GERD (gastroesophageal reflux disease)     "takes tums"  . Chronic low back pain     hx of  . Coronary atherosclerosis of native coronary artery     DES LAD 1/13, LVEF 35%, sees Dr. Sanjuana Kava  . Diabetes mellitus without complication     "borderline diabetic"    Past  Surgical History  Procedure Laterality Date  . Appendectomy    . Diagnostic laparoscopy    . Anal fissurectomy    . Coronary angioplasty      stent placed Dec 12, 2011  . Aorta - bilateral femoral artery bypass graft N/A 01/16/2013    Procedure: AORTA BIFEMORAL BYPASS GRAFT;  Surgeon: Nada Libman, MD;  Location: [facility] OR;  Service: Vascular;  Laterality: N/A;    Family History  Problem Relation Age of Onset  . Cancer Father     Lung  . Cancer Maternal Uncle     Colon  . Diabetes Mother   . COPD Mother     History  Substance Use Topics  . Smoking status: Current Every Day Smoker -- 0.50 packs/day for 25 years    Types: Cigarettes  . Smokeless tobacco: Never Used     Comment: pt states he is taking chantix  . Alcohol Use: 0.0 oz/week     Comment: Weekends      Review of Systems  Allergies  Hydrocodone  Home Medications   Current Outpatient Rx  Name  Route  Sig  Dispense  Refill  . aspirin 81 MG tablet   Oral   Take 162 mg by mouth daily.          . carvedilol (COREG) 6.25 MG tablet   Oral  Take 1 tablet (6.25 mg total) by mouth 2 (two) times daily with a meal.   60 tablet   6   . cephALEXin (KEFLEX) 500 MG capsule   Oral   Take 1 capsule (500 mg total) by mouth 3 (three) times daily.   30 capsule   0   . cyclobenzaprine (FLEXERIL) 10 MG tablet   Oral   Take 1 tablet (10 mg total) by mouth 2 (two) times daily as needed for muscle spasms.   20 tablet   0   . enalapril (VASOTEC) 5 MG tablet   Oral   Take 1 tablet (5 mg total) by mouth 2 (two) times daily.   60 tablet   6   . glucose blood test strip      Use to check blood sugar three time a day (fasting, at lunch time and a bedtime; before injecting lantus)   100 each   12   . glucose monitoring kit (FREESTYLE) monitoring kit   Does not apply   1 each by Does not apply route as needed for other.   1 each   0   . insulin glargine (LANTUS) 100 UNIT/ML injection   Subcutaneous   Inject  0.25 mLs (25 Units total) into the skin at bedtime.   3 mL   3   . insulin lispro (HUMALOG) 100 UNIT/ML injection      Before each meal 3 times a day, 140-199 - 2 units, 200-250 - 4 units, 251-299 - 6 units,  300-349 - 8 units,  350 or above 10 units.  Can dispense Humalog pen, if needed dispense syringes and needles.   10 mL   2   . Insulin Pen Needle (1ST CHOICE PEN NEEDLES) 31G X 8 MM MISC      Use to inject lantus at bedtime as instructed   100 each   1   . Lancets (FREESTYLE) lancets      Use as instructed   100 each   12   . nitroGLYCERIN (NITROSTAT) 0.4 MG SL tablet   Sublingual   Place 1 tablet (0.4 mg total) under the tongue every 5 (five) minutes as needed. For chest pain   25 tablet   3   . oxyCODONE-acetaminophen (PERCOCET/ROXICET) 5-325 MG per tablet   Oral   Take 1 tablet by mouth every 8 (eight) hours as needed for pain.   120 tablet   0   . prasugrel (EFFIENT) 10 MG TABS   Oral   Take 1 tablet (10 mg total) by mouth daily.   30 tablet   6   . simvastatin (ZOCOR) 40 MG tablet   Oral   Take 1 tablet (40 mg total) by mouth every evening.   30 tablet   5   . spironolactone (ALDACTONE) 25 MG tablet   Oral   Take 0.5 tablets (12.5 mg total) by mouth daily.   30 tablet   6   . varenicline (CHANTIX) 0.5 MG tablet   Oral   Take 1 tablet (0.5 mg total) by mouth 2 (two) times daily.   30 tablet   0     BP 115/73  Pulse 77  Temp(Src) 98 F (36.7 C) (Oral)  Resp 18  SpO2 100%  Physical Exam Middle aged male in no acute distress HEENT: No pallor, muscular mucosa, mild left tympanic membrane  Bulging without any discharge or perforation. Chest: Clear to auscultation bilaterally CVS: Normal S1 and S2, no murmurs Abdomen:  Soft, nontender, nondistended, recent midline scar stable Extremities warm, no edema, normal distal pulsations, normal sensations bilaterally CNS: AAOX 3   ED Course  Procedures (including critical care time)  Labs  reviewed Last hemoglobin A1c done one month back of 8. Lantus dose was  Increased.  Assessment/ plan: Chronic pain with claudication over her left arm. Patient was given narcotic prescription 1 month back but has run out of it. It appears he was getting percocet  for his left arm pain and b/l claudications for past several months prescribed by his cardiologist and vascular surgeon. His leg pain have markedly resolved.  I prescribed him a course of oxycodone for one month. He is scheduled for ct angiogram of neck next week following which Dr Myra Gianotti will decide on surgery of  left subclavian artery occlusion.  Left ear pain Mild bulging of tympanic membrane without any discharge. Continue BP medications for now which relieved his symptoms. No signs of infection requiring antibiotics.  Diabetes mellitus Hemoglobin A1c recently checked and was 8. lantus dose  Was already  increased to 20 units twice a day. He informs his fingersticks to be stable between 120-160. Will provide routine referral to opthalmology.    Remaining medical issues are stable and he will continue to take his medications. I would make a referral to ophthalmology. He informs having some bloody region which have improved after using reading glasses.  Counseled strongly on smoking cessation  No diagnosis found.    MDM  Followup in one month        Amylynn Fano, MD 03/24/13 1345  Pattiann Solanki, MD 03/24/13 1347

## 2013-03-24 NOTE — ED Notes (Signed)
Feel like pins and needles are sticking in his feet. Back Pain.

## 2013-03-24 NOTE — ED Notes (Addendum)
Patient presents for follow up on pain medication and refills.

## 2013-03-28 ENCOUNTER — Encounter: Payer: Self-pay | Admitting: Surgery

## 2013-03-31 ENCOUNTER — Ambulatory Visit (INDEPENDENT_AMBULATORY_CARE_PROVIDER_SITE_OTHER): Payer: Medicaid Other | Admitting: Surgery

## 2013-03-31 ENCOUNTER — Encounter: Payer: Self-pay | Admitting: Surgery

## 2013-03-31 ENCOUNTER — Ambulatory Visit
Admission: RE | Admit: 2013-03-31 | Discharge: 2013-03-31 | Disposition: A | Discharge status: Home / Self Care | Payer: Medicaid Other | Source: Ambulatory Visit | Attending: Surgery | Admitting: Surgery

## 2013-03-31 ENCOUNTER — Other Ambulatory Visit: Payer: Self-pay

## 2013-03-31 VITALS — BP 133/90 | HR 65 | Resp 16 | Ht 68.5 in | Wt 203.0 lb

## 2013-03-31 DIAGNOSIS — M25522 Pain in left elbow: Secondary | ICD-10-CM

## 2013-03-31 DIAGNOSIS — M25529 Pain in unspecified elbow: Secondary | ICD-10-CM | POA: Insufficient documentation

## 2013-03-31 DIAGNOSIS — R202 Paresthesia of skin: Secondary | ICD-10-CM

## 2013-03-31 DIAGNOSIS — R209 Unspecified disturbances of skin sensation: Secondary | ICD-10-CM

## 2013-03-31 DIAGNOSIS — I739 Peripheral vascular disease, unspecified: Secondary | ICD-10-CM

## 2013-03-31 DIAGNOSIS — R200 Anesthesia of skin: Secondary | ICD-10-CM

## 2013-03-31 DIAGNOSIS — G458 Other transient cerebral ischemic attacks and related syndromes: Secondary | ICD-10-CM

## 2013-03-31 HISTORY — DX: Peripheral vascular disease, unspecified: I73.9

## 2013-03-31 HISTORY — DX: Paresthesia of skin: R20.0

## 2013-03-31 HISTORY — DX: Paresthesia of skin: R20.2

## 2013-03-31 MED ORDER — IOHEXOL 350 MG/ML SOLN
100.0000 mL | Freq: Once | INTRAVENOUS | Status: AC | PRN
  Administered 2013-03-31: 100 mL via INTRAVENOUS

## 2013-03-31 NOTE — Progress Notes (Signed)
[facility] [place]   Patient name: Ryan [name] MRN: [id number] DOB: 09/29/1966 Sex: male     Chief Complaint  Patient presents with  . PAD    F/up with cta today at [place]. Img   C/O Left arm weakness, numbness and hurting,duration1 plus year.  . PVD    HISTORY OF PRESENT ILLNESS: The patient is back today for followup. I initially met him in March of 2013. He suffered a MI in January 2013, and at that time he had a drug-eluting stent placed to his LAD. During this process, an abdominal aortogram revealed aortic occlusion at the level of the renal arteries. He also has a left subclavian artery occlusion. Because of his drug-eluting stent, I have not been able to proceed with surgical revascularization, because he has not been able to come off of his antiplatelet medication. He underwent aortobifemoral bypass grafting and has nearly recovered from his operation. He has a known occluded left subclavian artery which causes him significant left arm symptoms. He states that with minimal activity his left arm gets out. He will get numbness in his fingertips. He did not have any neurologic symptoms. He has been off of his Effient for a few days because he ran out. He did not restart it, hoping that he get his operation done.   Past Medical History  Diagnosis Date  . MI (myocardial infarction)     AMI 1/13 - complicated by VT/Tosades  . Chronic back pain   . Anal fissure   . Rectal polyp   . Mixed hyperlipidemia   . PAD (peripheral artery disease)   . Hypertension     "patient states does not have primary doctor"  . Kidney stones   . GERD (gastroesophageal reflux disease)     "takes tums"  . Chronic low back pain     hx of  . Coronary atherosclerosis of native coronary artery     DES LAD 1/13, LVEF 35%, sees Dr. McAlhaney  . Diabetes mellitus without complication     "borderline diabetic"    Past Surgical History  Procedure Laterality Date  . Appendectomy    .  Diagnostic laparoscopy    . Anal fissurectomy    . Coronary angioplasty      stent placed Dec 12, 2011  . Aorta - bilateral femoral artery bypass graft N/A 01/16/2013    Procedure: AORTA BIFEMORAL BYPASS GRAFT;  Surgeon: Aggie Douse W Conan Mcmanaway, MD;  Location: [facility] OR;  Service: Vascular;  Laterality: N/A;    History   Social History  . Marital Status: Divorced    Spouse Name: N/A    Number of Children: N/A  . Years of Education: N/A   Occupational History  .  Other   Social History Main Topics  . Smoking status: Current Every Day Smoker -- 0.50 packs/day for 25 years    Types: Cigarettes  . Smokeless tobacco: Never Used     Comment: pt states he is taking chantix  . Alcohol Use: 0.0 oz/week     Comment: Weekends  . Drug Use: No  . Sexually Active: Not on file   Other Topics Concern  . Not on file   Social History Narrative   [employer] - physical labor  - works night shift          Family History  Problem Relation Age of Onset  . Cancer Father     Lung  . Cancer Maternal Uncle       Colon  . Diabetes Mother   . COPD Mother     Allergies as of 03/31/2013 - Review Complete 03/31/2013  Allergen Reaction Noted  . Hydrocodone Nausea And Vomiting 08/29/2012    Current Outpatient Prescriptions on File Prior to Visit  Medication Sig Dispense Refill  . aspirin 81 MG tablet Take 162 mg by mouth daily.       . carvedilol (COREG) 6.25 MG tablet Take 1 tablet (6.25 mg total) by mouth 2 (two) times daily with a meal.  60 tablet  6  . cephALEXin (KEFLEX) 500 MG capsule Take 1 capsule (500 mg total) by mouth 3 (three) times daily.  30 capsule  0  . cyclobenzaprine (FLEXERIL) 10 MG tablet Take 1 tablet (10 mg total) by mouth 2 (two) times daily as needed for muscle spasms.  20 tablet  0  . enalapril (VASOTEC) 5 MG tablet Take 1 tablet (5 mg total) by mouth 2 (two) times daily.  60 tablet  6  . glucose blood test strip Use to check blood sugar three time a day (fasting, at  lunch time and a bedtime; before injecting lantus)  100 each  12  . glucose monitoring kit (FREESTYLE) monitoring kit 1 each by Does not apply route as needed for other.  1 each  0  . insulin glargine (LANTUS) 100 UNIT/ML injection Inject 0.25 mLs (25 Units total) into the skin at bedtime.  3 mL  3  . insulin lispro (HUMALOG) 100 UNIT/ML injection Before each meal 3 times a day, 140-199 - 2 units, 200-250 - 4 units, 251-299 - 6 units,  300-349 - 8 units,  350 or above 10 units.  Can dispense Humalog pen, if needed dispense syringes and needles.  10 mL  2  . Insulin Pen Needle (1ST CHOICE PEN NEEDLES) 31G X 8 MM MISC Use to inject lantus at bedtime as instructed  100 each  1  . Lancets (FREESTYLE) lancets Use as instructed  100 each  12  . nitroGLYCERIN (NITROSTAT) 0.4 MG SL tablet Place 1 tablet (0.4 mg total) under the tongue every 5 (five) minutes as needed. For chest pain  25 tablet  3  . oxyCODONE-acetaminophen (PERCOCET/ROXICET) 5-325 MG per tablet Take 1 tablet by mouth every 6 (six) hours as needed for pain.  120 tablet  0  . prasugrel (EFFIENT) 10 MG TABS Take 1 tablet (10 mg total) by mouth daily.  30 tablet  6  . simvastatin (ZOCOR) 40 MG tablet Take 1 tablet (40 mg total) by mouth every evening.  30 tablet  5  . spironolactone (ALDACTONE) 25 MG tablet Take 0.5 tablets (12.5 mg total) by mouth daily.  30 tablet  6  . varenicline (CHANTIX) 0.5 MG tablet Take 1 tablet (0.5 mg total) by mouth 2 (two) times daily.  30 tablet  0   No current facility-administered medications on file prior to visit.     REVIEW OF SYSTEMS: Please see history of present illness, otherwise all systems are negative as documented by the patient in the encounter form  PHYSICAL EXAMINATION:   Vital signs are BP 133/90  Pulse 65  Resp 16  Ht 5' 8.5" (1.74 m)  Wt 203 lb (92.08 kg)  BMI 30.41 kg/m2  SpO2 100% General: The patient appears their stated age. HEENT:  No gross abnormalities Pulmonary:  Non  labored breathing Abdomen: Soft and non-tender midline incision well healed. No hernia Musculoskeletal: There are no major deformities. Neurologic: No focal weakness or   paresthesias are detected, Skin: There are no ulcer or rashes noted. Psychiatric: The patient has normal affect. Cardiovascular: There is a regular rate and rhythm without significant murmur appreciated. No carotid bruits   Diagnostic Studies I have reviewed his CT angiogram of the head and neck. This shows left subclavian artery occlusion  Assessment: Left subclavian steal  Plan: The patient will be scheduled for a left carotid to subclavian bypass graft this Thursday. He has been off of his Effient for several days. We discussed the risks benefits of the operation including injury to the vagus or phrenic nerve. We also discussed the possibility of lymphatic leak.  V. Wells Falesha Schommer IV, M.D. [facility] [place] Office: [phone] Pager:  [phone]    

## 2013-04-01 ENCOUNTER — Encounter (HOSPITAL_COMMUNITY): Payer: Self-pay | Admitting: Pharmacist

## 2013-04-01 NOTE — ED Notes (Signed)
Referral faxed to [facility] 04/08/13

## 2013-04-02 ENCOUNTER — Encounter (HOSPITAL_COMMUNITY): Payer: Self-pay

## 2013-04-02 ENCOUNTER — Encounter (HOSPITAL_COMMUNITY)
Admission: RE | Admit: 2013-04-02 | Discharge: 2013-04-02 | Disposition: A | Discharge status: Home / Self Care | Payer: Medicaid Other | Source: Ambulatory Visit | Attending: Surgery | Admitting: Surgery

## 2013-04-02 HISTORY — DX: Spontaneous ecchymoses: R23.3

## 2013-04-02 HISTORY — DX: Personal history of urinary calculi: Z87.442

## 2013-04-02 HISTORY — DX: Personal history of other medical treatment: Z92.89

## 2013-04-02 HISTORY — DX: Other skin changes: R23.8

## 2013-04-02 LAB — COMPREHENSIVE METABOLIC PANEL
AST: 22 U/L (ref 0–37)
Albumin: 3.7 g/dL (ref 3.5–5.2)
Alkaline Phosphatase: 77 U/L (ref 39–117)
BUN: 10 mg/dL (ref 6–23)
Chloride: 102 mEq/L (ref 96–112)
Potassium: 3.8 mEq/L (ref 3.5–5.1)
Sodium: 139 mEq/L (ref 135–145)
Total Protein: 7.3 g/dL (ref 6.0–8.3)

## 2013-04-02 LAB — PROTIME-INR: Prothrombin Time: 12.2 seconds (ref 11.6–15.2)

## 2013-04-02 LAB — URINALYSIS, ROUTINE W REFLEX MICROSCOPIC
Bilirubin Urine: NEGATIVE
Ketones, ur: NEGATIVE mg/dL
Leukocytes, UA: NEGATIVE
Nitrite: NEGATIVE
Specific Gravity, Urine: 1.015 (ref 1.005–1.030)
Urobilinogen, UA: 0.2 mg/dL (ref 0.0–1.0)

## 2013-04-02 LAB — CBC
HCT: 42.1 % (ref 39.0–52.0)
MCHC: 34.4 g/dL (ref 30.0–36.0)
Platelets: 255 10*3/uL (ref 150–400)
RDW: 14.2 % (ref 11.5–15.5)
WBC: 10.4 10*3/uL (ref 4.0–10.5)

## 2013-04-02 LAB — SURGICAL PCR SCREEN
MRSA, PCR: NEGATIVE
Staphylococcus aureus: NEGATIVE

## 2013-04-02 LAB — APTT: aPTT: 28 seconds (ref 24–37)

## 2013-04-02 LAB — TYPE AND SCREEN: Antibody Screen: NEGATIVE

## 2013-04-02 MED ORDER — CEFUROXIME SODIUM 1.5 G IJ SOLR
1.5000 g | INTRAMUSCULAR | Status: AC
  Administered 2013-04-03: 1.5 g via INTRAVENOUS
  Filled 2013-04-02: qty 1.5

## 2013-04-02 NOTE — Progress Notes (Signed)
Dr.McAlhaney is cardiologist and last visit was 6months ago-visit in epic   Echo/stress test/heart cath reports in epic from 12/2011  Goes to Urgent Care as Medical Md usage  CXR in epic from 08/29/13  EKG in epic from 01/06/13

## 2013-04-02 NOTE — Pre-Procedure Instructions (Signed)
Ryan [name]  04/02/2013   Your procedure is scheduled on:  Thurs, May 1 @ 7:30 AM  Report to [facility] at 5:30 AM.  Call this number if you have problems the morning of surgery: [phone]   Remember:   Do not eat food or drink liquids after midnight.   Take these medicines the morning of surgery with A SIP OF WATER: Carvedilol(Coreg) and Pain Pill(if needed)   Do not wear jewelry  Do not wear lotions, powders, or colognes. You may wear deodorant.  Men may shave face and neck.  Do not bring valuables to the hospital.  Contacts, dentures or bridgework may not be worn into surgery.  Leave suitcase in the car. After surgery it may be brought to your room.  For patients admitted to the hospital, checkout time is 11:00 AM the day of  discharge.   Patients discharged the day of surgery will not be allowed to drive  home.  Special Instructions: Shower using CHG 2 nights before surgery and the night before surgery.  If you shower the day of surgery use CHG.  Use special wash - you have one bottle of CHG for all showers.  You should use approximately 1/3 of the bottle for each shower.   Please read over the following fact sheets that you were given: Pain Booklet, Coughing and Deep Breathing, Blood Transfusion Information, MRSA Information and Surgical Site Infection Prevention

## 2013-04-03 ENCOUNTER — Encounter (HOSPITAL_COMMUNITY): Payer: Self-pay | Admitting: *Deleted

## 2013-04-03 ENCOUNTER — Inpatient Hospital Stay (HOSPITAL_COMMUNITY): Payer: Medicaid Other | Admitting: Certified Registered"

## 2013-04-03 ENCOUNTER — Encounter (HOSPITAL_COMMUNITY)
Admission: RE | Disposition: A | Discharge status: Home / Self Care | Payer: Self-pay | Source: Ambulatory Visit | Attending: Surgery

## 2013-04-03 ENCOUNTER — Inpatient Hospital Stay (HOSPITAL_COMMUNITY)
Admission: RE | Admit: 2013-04-03 | Discharge: 2013-04-05 | DRG: 238 | Disposition: A | Discharge status: Home / Self Care | Payer: Medicaid Other | Source: Ambulatory Visit | Attending: Surgery | Admitting: Surgery

## 2013-04-03 ENCOUNTER — Encounter (HOSPITAL_COMMUNITY): Payer: Self-pay | Admitting: Certified Registered"

## 2013-04-03 DIAGNOSIS — Z01812 Encounter for preprocedural laboratory examination: Secondary | ICD-10-CM

## 2013-04-03 DIAGNOSIS — I7092 Chronic total occlusion of artery of the extremities: Secondary | ICD-10-CM

## 2013-04-03 DIAGNOSIS — M545 Low back pain, unspecified: Secondary | ICD-10-CM | POA: Diagnosis present

## 2013-04-03 DIAGNOSIS — I252 Old myocardial infarction: Secondary | ICD-10-CM

## 2013-04-03 DIAGNOSIS — E119 Type 2 diabetes mellitus without complications: Secondary | ICD-10-CM | POA: Diagnosis present

## 2013-04-03 DIAGNOSIS — Z9861 Coronary angioplasty status: Secondary | ICD-10-CM

## 2013-04-03 DIAGNOSIS — K219 Gastro-esophageal reflux disease without esophagitis: Secondary | ICD-10-CM | POA: Diagnosis present

## 2013-04-03 DIAGNOSIS — I708 Atherosclerosis of other arteries: Secondary | ICD-10-CM

## 2013-04-03 DIAGNOSIS — I70219 Atherosclerosis of native arteries of extremities with intermittent claudication, unspecified extremity: Secondary | ICD-10-CM

## 2013-04-03 DIAGNOSIS — I10 Essential (primary) hypertension: Secondary | ICD-10-CM | POA: Diagnosis present

## 2013-04-03 DIAGNOSIS — Z79899 Other long term (current) drug therapy: Secondary | ICD-10-CM

## 2013-04-03 DIAGNOSIS — I2510 Atherosclerotic heart disease of native coronary artery without angina pectoris: Secondary | ICD-10-CM | POA: Diagnosis present

## 2013-04-03 DIAGNOSIS — Z7982 Long term (current) use of aspirin: Secondary | ICD-10-CM

## 2013-04-03 DIAGNOSIS — G8929 Other chronic pain: Secondary | ICD-10-CM | POA: Diagnosis present

## 2013-04-03 DIAGNOSIS — Z794 Long term (current) use of insulin: Secondary | ICD-10-CM

## 2013-04-03 DIAGNOSIS — F17200 Nicotine dependence, unspecified, uncomplicated: Secondary | ICD-10-CM | POA: Diagnosis present

## 2013-04-03 DIAGNOSIS — E782 Mixed hyperlipidemia: Secondary | ICD-10-CM | POA: Diagnosis present

## 2013-04-03 HISTORY — PX: CAROTID-SUBCLAVIAN BYPASS GRAFT: SHX910

## 2013-04-03 LAB — GLUCOSE, CAPILLARY
Glucose-Capillary: 125 mg/dL — ABNORMAL HIGH (ref 70–99)
Glucose-Capillary: 141 mg/dL — ABNORMAL HIGH (ref 70–99)

## 2013-04-03 SURGERY — CREATION, BYPASS, ARTERIAL, SUBCLAVIAN TO CAROTID, USING GRAFT
Anesthesia: General | Site: Neck | Laterality: Left | Wound class: Clean

## 2013-04-03 MED ORDER — LIDOCAINE HCL 4 % MT SOLN
OROMUCOSAL | Status: DC | PRN
  Administered 2013-04-03: 4 mL via TOPICAL

## 2013-04-03 MED ORDER — NITROGLYCERIN 0.4 MG SL SUBL: 0.4000 mg | SUBLINGUAL_TABLET | SUBLINGUAL | Status: DC | PRN

## 2013-04-03 MED ORDER — ONDANSETRON HCL 4 MG/2ML IJ SOLN
INTRAMUSCULAR | Status: DC | PRN
  Administered 2013-04-03: 4 mg via INTRAVENOUS

## 2013-04-03 MED ORDER — BISACODYL 10 MG RE SUPP: 10.0000 mg | Freq: Every day | RECTAL | Status: DC | PRN

## 2013-04-03 MED ORDER — METOPROLOL TARTRATE 1 MG/ML IV SOLN
2.0000 mg | INTRAVENOUS | Status: DC | PRN
  Administered 2013-04-03: 5 mg via INTRAVENOUS
  Filled 2013-04-03: qty 5

## 2013-04-03 MED ORDER — SPIRONOLACTONE 5 MG/ML ORAL SUSPENSION
6.2500 mg | Freq: Every day | ORAL | Status: DC
Start: 2013-04-03 — End: 2013-04-05
  Administered 2013-04-03 – 2013-04-05 (×3): 6.5 mg via ORAL
  Filled 2013-04-03 (×5): qty 1.3

## 2013-04-03 MED ORDER — HYDROMORPHONE HCL PF 1 MG/ML IJ SOLN
0.2500 mg | INTRAMUSCULAR | Status: DC | PRN
  Administered 2013-04-03 (×2): 0.5 mg via INTRAVENOUS

## 2013-04-03 MED ORDER — DOPAMINE-DEXTROSE 3.2-5 MG/ML-% IV SOLN: 3.0000 ug/kg/min | INTRAVENOUS | Status: DC

## 2013-04-03 MED ORDER — ACETAMINOPHEN 325 MG PO TABS: 325.0000 mg | ORAL_TABLET | ORAL | Status: DC | PRN

## 2013-04-03 MED ORDER — GUAIFENESIN-DM 100-10 MG/5ML PO SYRP: 15.0000 mL | ORAL_SOLUTION | ORAL | Status: DC | PRN

## 2013-04-03 MED ORDER — ONDANSETRON HCL 4 MG/2ML IJ SOLN: 4.0000 mg | Freq: Once | INTRAMUSCULAR | Status: DC | PRN

## 2013-04-03 MED ORDER — ASPIRIN EC 325 MG PO TBEC
325.0000 mg | DELAYED_RELEASE_TABLET | Freq: Every day | ORAL | Status: DC
  Administered 2013-04-03 – 2013-04-05 (×3): 325 mg via ORAL
  Filled 2013-04-03 (×3): qty 1

## 2013-04-03 MED ORDER — PANTOPRAZOLE SODIUM 40 MG PO TBEC
40.0000 mg | DELAYED_RELEASE_TABLET | Freq: Every day | ORAL | Status: DC
  Administered 2013-04-03 – 2013-04-05 (×3): 40 mg via ORAL
  Filled 2013-04-03 (×3): qty 1

## 2013-04-03 MED ORDER — ARTIFICIAL TEARS OP OINT
TOPICAL_OINTMENT | OPHTHALMIC | Status: DC | PRN
  Administered 2013-04-03: 1 via OPHTHALMIC

## 2013-04-03 MED ORDER — POTASSIUM CHLORIDE CRYS ER 20 MEQ PO TBCR: 20.0000 meq | EXTENDED_RELEASE_TABLET | Freq: Once | ORAL | Status: AC | PRN

## 2013-04-03 MED ORDER — MAGNESIUM SULFATE 40 MG/ML IJ SOLN
2.0000 g | Freq: Once | INTRAMUSCULAR | Status: AC | PRN
  Filled 2013-04-03: qty 50

## 2013-04-03 MED ORDER — MIDAZOLAM HCL 5 MG/5ML IJ SOLN
INTRAMUSCULAR | Status: DC | PRN
  Administered 2013-04-03: 2 mg via INTRAVENOUS

## 2013-04-03 MED ORDER — OXYCODONE-ACETAMINOPHEN 5-325 MG PO TABS
1.0000 | ORAL_TABLET | ORAL | Status: DC | PRN
  Administered 2013-04-03 – 2013-04-05 (×9): 2 via ORAL
  Filled 2013-04-03 (×9): qty 2

## 2013-04-03 MED ORDER — SODIUM CHLORIDE 0.9 % IR SOLN
Status: DC | PRN
  Administered 2013-04-03: 08:00:00

## 2013-04-03 MED ORDER — INSULIN GLARGINE 100 UNIT/ML ~~LOC~~ SOLN
20.0000 [IU] | Freq: Every day | SUBCUTANEOUS | Status: DC
  Administered 2013-04-03 – 2013-04-04 (×2): 20 [IU] via SUBCUTANEOUS
  Filled 2013-04-03 (×3): qty 0.2

## 2013-04-03 MED ORDER — FENTANYL CITRATE 0.05 MG/ML IJ SOLN
INTRAMUSCULAR | Status: DC | PRN
  Administered 2013-04-03 (×4): 50 ug via INTRAVENOUS
  Administered 2013-04-03: 100 ug via INTRAVENOUS
  Administered 2013-04-03 (×4): 50 ug via INTRAVENOUS

## 2013-04-03 MED ORDER — ACETAMINOPHEN 10 MG/ML IV SOLN: 1000.0000 mg | Freq: Once | INTRAVENOUS | Status: DC | PRN

## 2013-04-03 MED ORDER — LACTATED RINGERS IV SOLN
INTRAVENOUS | Status: DC | PRN
  Administered 2013-04-03: 07:00:00 via INTRAVENOUS

## 2013-04-03 MED ORDER — ROCURONIUM BROMIDE 100 MG/10ML IV SOLN
INTRAVENOUS | Status: DC | PRN
  Administered 2013-04-03: 50 mg via INTRAVENOUS

## 2013-04-03 MED ORDER — NEOSTIGMINE METHYLSULFATE 1 MG/ML IJ SOLN
INTRAMUSCULAR | Status: DC | PRN
  Administered 2013-04-03: 1 mg via INTRAVENOUS

## 2013-04-03 MED ORDER — SODIUM CHLORIDE 0.9 % IV SOLN: INTRAVENOUS | Status: DC

## 2013-04-03 MED ORDER — DEXTROSE 5 % IV SOLN
1.5000 g | Freq: Two times a day (BID) | INTRAVENOUS | Status: AC
  Administered 2013-04-03 – 2013-04-04 (×2): 1.5 g via INTRAVENOUS
  Filled 2013-04-03 (×2): qty 1.5

## 2013-04-03 MED ORDER — CARVEDILOL 6.25 MG PO TABS
6.2500 mg | ORAL_TABLET | Freq: Two times a day (BID) | ORAL | Status: DC
  Administered 2013-04-03 – 2013-04-05 (×4): 6.25 mg via ORAL
  Filled 2013-04-03 (×6): qty 1

## 2013-04-03 MED ORDER — VECURONIUM BROMIDE 10 MG IV SOLR
INTRAVENOUS | Status: DC | PRN
  Administered 2013-04-03: 1 mg via INTRAVENOUS
  Administered 2013-04-03: 2 mg via INTRAVENOUS
  Administered 2013-04-03: 1 mg via INTRAVENOUS
  Administered 2013-04-03: 2 mg via INTRAVENOUS
  Administered 2013-04-03: 1 mg via INTRAVENOUS

## 2013-04-03 MED ORDER — PHENOL 1.4 % MT LIQD: 1.0000 | OROMUCOSAL | Status: DC | PRN

## 2013-04-03 MED ORDER — LACTATED RINGERS IV SOLN
INTRAVENOUS | Status: DC | PRN
  Administered 2013-04-03 (×2): via INTRAVENOUS

## 2013-04-03 MED ORDER — SENNOSIDES-DOCUSATE SODIUM 8.6-50 MG PO TABS
1.0000 | ORAL_TABLET | Freq: Every evening | ORAL | Status: DC | PRN
  Filled 2013-04-03: qty 1

## 2013-04-03 MED ORDER — PROPOFOL 10 MG/ML IV BOLUS
INTRAVENOUS | Status: DC | PRN
  Administered 2013-04-03: 10 mg via INTRAVENOUS
  Administered 2013-04-03: 30 mg via INTRAVENOUS
  Administered 2013-04-03: 10 mg via INTRAVENOUS
  Administered 2013-04-03: 20 mg via INTRAVENOUS
  Administered 2013-04-03: 150 mg via INTRAVENOUS
  Administered 2013-04-03: 20 mg via INTRAVENOUS

## 2013-04-03 MED ORDER — CARVEDILOL 6.25 MG PO TABS
6.2500 mg | ORAL_TABLET | Freq: Two times a day (BID) | ORAL | Status: DC
  Administered 2013-04-03: 6.25 mg via ORAL
  Filled 2013-04-03 (×3): qty 1

## 2013-04-03 MED ORDER — DOCUSATE SODIUM 100 MG PO CAPS
100.0000 mg | ORAL_CAPSULE | Freq: Every day | ORAL | Status: DC
  Administered 2013-04-04 – 2013-04-05 (×2): 100 mg via ORAL
  Filled 2013-04-03 (×2): qty 1

## 2013-04-03 MED ORDER — HYDRALAZINE HCL 20 MG/ML IJ SOLN: 10.0000 mg | INTRAMUSCULAR | Status: DC | PRN

## 2013-04-03 MED ORDER — GLYCOPYRROLATE 0.2 MG/ML IJ SOLN
INTRAMUSCULAR | Status: DC | PRN
  Administered 2013-04-03: 0.2 mg via INTRAVENOUS

## 2013-04-03 MED ORDER — 0.9 % SODIUM CHLORIDE (POUR BTL) OPTIME
TOPICAL | Status: DC | PRN
  Administered 2013-04-03: 2000 mL

## 2013-04-03 MED ORDER — INSULIN ASPART 100 UNIT/ML ~~LOC~~ SOLN
0.0000 [IU] | SUBCUTANEOUS | Status: DC
  Administered 2013-04-03 (×2): 2 [IU] via SUBCUTANEOUS
  Administered 2013-04-04: 3 [IU] via SUBCUTANEOUS
  Administered 2013-04-04: 2 [IU] via SUBCUTANEOUS
  Administered 2013-04-04: 3 [IU] via SUBCUTANEOUS
  Administered 2013-04-04: 2 [IU] via SUBCUTANEOUS

## 2013-04-03 MED ORDER — LIDOCAINE HCL (CARDIAC) 20 MG/ML IV SOLN
INTRAVENOUS | Status: DC | PRN
  Administered 2013-04-03: 50 mg via INTRAVENOUS

## 2013-04-03 MED ORDER — LIDOCAINE HCL (PF) 1 % IJ SOLN
INTRAMUSCULAR | Status: AC
  Filled 2013-04-03: qty 30

## 2013-04-03 MED ORDER — LABETALOL HCL 5 MG/ML IV SOLN: 10.0000 mg | INTRAVENOUS | Status: DC | PRN

## 2013-04-03 MED ORDER — ESMOLOL HCL 10 MG/ML IV SOLN
INTRAVENOUS | Status: DC | PRN
  Administered 2013-04-03 (×2): 10 mg via INTRAVENOUS

## 2013-04-03 MED ORDER — ENALAPRIL MALEATE 5 MG PO TABS
5.0000 mg | ORAL_TABLET | Freq: Two times a day (BID) | ORAL | Status: DC
  Administered 2013-04-03 – 2013-04-05 (×5): 5 mg via ORAL
  Filled 2013-04-03 (×6): qty 1

## 2013-04-03 MED ORDER — MORPHINE SULFATE 2 MG/ML IJ SOLN
2.0000 mg | INTRAMUSCULAR | Status: DC | PRN
  Administered 2013-04-03 – 2013-04-04 (×5): 4 mg via INTRAVENOUS
  Filled 2013-04-03 (×6): qty 2

## 2013-04-03 MED ORDER — ACETAMINOPHEN 650 MG RE SUPP: 325.0000 mg | RECTAL | Status: DC | PRN

## 2013-04-03 MED ORDER — SIMVASTATIN 40 MG PO TABS
40.0000 mg | ORAL_TABLET | Freq: Every evening | ORAL | Status: DC
  Administered 2013-04-03 – 2013-04-04 (×2): 40 mg via ORAL
  Filled 2013-04-03 (×3): qty 1

## 2013-04-03 MED ORDER — SODIUM CHLORIDE 0.9 % IV SOLN
INTRAVENOUS | Status: DC
  Administered 2013-04-03 – 2013-04-04 (×2): via INTRAVENOUS

## 2013-04-03 MED ORDER — HYDROMORPHONE HCL PF 1 MG/ML IJ SOLN
INTRAMUSCULAR | Status: AC
  Filled 2013-04-03: qty 1

## 2013-04-03 MED ORDER — HEPARIN SODIUM (PORCINE) 1000 UNIT/ML IJ SOLN
INTRAMUSCULAR | Status: DC | PRN
  Administered 2013-04-03: 3000 [IU] via INTRAVENOUS
  Administered 2013-04-03: 8000 [IU] via INTRAVENOUS

## 2013-04-03 MED ORDER — HEMOSTATIC AGENTS (NO CHARGE) OPTIME
TOPICAL | Status: DC | PRN
  Administered 2013-04-03 (×2): 1 via TOPICAL

## 2013-04-03 MED ORDER — ASPIRIN 81 MG PO TABS: 162.0000 mg | ORAL_TABLET | Freq: Every day | ORAL | Status: DC

## 2013-04-03 MED ORDER — PHENYLEPHRINE HCL 10 MG/ML IJ SOLN
10.0000 mg | INTRAVENOUS | Status: DC | PRN
  Administered 2013-04-03: 25 ug/min via INTRAVENOUS
  Administered 2013-04-03: 12:00:00 via INTRAVENOUS

## 2013-04-03 MED ORDER — ONDANSETRON HCL 4 MG/2ML IJ SOLN
4.0000 mg | Freq: Four times a day (QID) | INTRAMUSCULAR | Status: DC | PRN
  Administered 2013-04-04: 4 mg via INTRAVENOUS
  Filled 2013-04-03: qty 2

## 2013-04-03 MED ORDER — SPIRONOLACTONE 25 MG PO TABS: 6.2500 mg | ORAL_TABLET | Freq: Every day | ORAL | Status: DC

## 2013-04-03 MED ORDER — SODIUM CHLORIDE 0.9 % IV SOLN: 500.0000 mL | Freq: Once | INTRAVENOUS | Status: AC | PRN

## 2013-04-03 MED ORDER — PROTAMINE SULFATE 10 MG/ML IV SOLN
INTRAVENOUS | Status: DC | PRN
  Administered 2013-04-03 (×2): 20 mg via INTRAVENOUS
  Administered 2013-04-03: 10 mg via INTRAVENOUS

## 2013-04-03 SURGICAL SUPPLY — 62 items
BAG DECANTER FOR FLEXI CONT (MISCELLANEOUS) ×2 IMPLANT
CANISTER SUCTION 2500CC (MISCELLANEOUS) ×2 IMPLANT
CATH ROBINSON RED A/P 18FR (CATHETERS) ×2 IMPLANT
CATH SUCT 10FR WHISTLE TIP (CATHETERS) IMPLANT
CLIP TI MEDIUM 24 (CLIP) ×2 IMPLANT
CLIP TI WIDE RED SMALL 24 (CLIP) ×2 IMPLANT
CLOTH BEACON ORANGE TIMEOUT ST (SAFETY) ×2 IMPLANT
COVER SURGICAL LIGHT HANDLE (MISCELLANEOUS) ×2 IMPLANT
CRADLE DONUT ADULT HEAD (MISCELLANEOUS) ×2 IMPLANT
DERMABOND ADVANCED (GAUZE/BANDAGES/DRESSINGS) ×1
DERMABOND ADVANCED .7 DNX12 (GAUZE/BANDAGES/DRESSINGS) ×1 IMPLANT
DRAIN CHANNEL 15F RND FF W/TCR (WOUND CARE) IMPLANT
DRAPE WARM FLUID 44X44 (DRAPE) ×2 IMPLANT
ELECT BLADE 4.0 EZ CLEAN MEGAD (MISCELLANEOUS) ×2
ELECT REM PT RETURN 9FT ADLT (ELECTROSURGICAL) ×2
ELECTRODE BLDE 4.0 EZ CLN MEGD (MISCELLANEOUS) ×1 IMPLANT
ELECTRODE REM PT RTRN 9FT ADLT (ELECTROSURGICAL) ×1 IMPLANT
EVACUATOR SILICONE 100CC (DRAIN) IMPLANT
GLOVE BIO SURGEON STRL SZ7.5 (GLOVE) ×4 IMPLANT
GLOVE BIOGEL PI IND STRL 6.5 (GLOVE) ×5 IMPLANT
GLOVE BIOGEL PI IND STRL 7.0 (GLOVE) ×2 IMPLANT
GLOVE BIOGEL PI IND STRL 7.5 (GLOVE) ×3 IMPLANT
GLOVE BIOGEL PI INDICATOR 6.5 (GLOVE) ×5
GLOVE BIOGEL PI INDICATOR 7.0 (GLOVE) ×2
GLOVE BIOGEL PI INDICATOR 7.5 (GLOVE) ×3
GLOVE SS BIOGEL STRL SZ 6.5 (GLOVE) ×2 IMPLANT
GLOVE SUPERSENSE BIOGEL SZ 6.5 (GLOVE) ×2
GLOVE SURG SS PI 6.5 STRL IVOR (GLOVE) ×8 IMPLANT
GLOVE SURG SS PI 7.5 STRL IVOR (GLOVE) ×4 IMPLANT
GOWN PREVENTION PLUS XXLARGE (GOWN DISPOSABLE) ×4 IMPLANT
GOWN STRL NON-REIN LRG LVL3 (GOWN DISPOSABLE) ×14 IMPLANT
GRAFT HEMASHIELD 8MM (Vascular Products) ×1 IMPLANT
GRAFT VASC STRG 30X8KNIT (Vascular Products) ×1 IMPLANT
HEMOSTAT SNOW SURGICEL 2X4 (HEMOSTASIS) ×2 IMPLANT
HEMOSTAT SURGICEL 2X14 (HEMOSTASIS) IMPLANT
KIT BASIN OR (CUSTOM PROCEDURE TRAY) ×2 IMPLANT
KIT ROOM TURNOVER OR (KITS) ×2 IMPLANT
NS IRRIG 1000ML POUR BTL (IV SOLUTION) ×4 IMPLANT
PACK CAROTID (CUSTOM PROCEDURE TRAY) ×2 IMPLANT
PAD ARMBOARD 7.5X6 YLW CONV (MISCELLANEOUS) ×4 IMPLANT
PUNCH AORTIC ROTATE 5MM 8IN (MISCELLANEOUS) ×2 IMPLANT
SEALANT SURG COSEAL 8ML (VASCULAR PRODUCTS) ×2 IMPLANT
SHUNT CAROTID BYPASS 10 (VASCULAR PRODUCTS) IMPLANT
SHUNT CAROTID BYPASS 12 (VASCULAR PRODUCTS) IMPLANT
SPECIMEN JAR SMALL (MISCELLANEOUS) IMPLANT
SUT ETHILON 3 0 PS 1 (SUTURE) IMPLANT
SUT PROLENE 5 0 CC1 (SUTURE) ×2 IMPLANT
SUT PROLENE 5 0 RB 1 DA (SUTURE) ×2 IMPLANT
SUT PROLENE 5 0 RB 2 (SUTURE) ×2 IMPLANT
SUT PROLENE 6 0 BV (SUTURE) ×6 IMPLANT
SUT PROLENE 7 0 BV 1 (SUTURE) IMPLANT
SUT SILK 3 0 (SUTURE) ×1
SUT SILK 3-0 18XBRD TIE 12 (SUTURE) ×1 IMPLANT
SUT VIC AB 2-0 CT1 27 (SUTURE) ×3
SUT VIC AB 2-0 CT1 TAPERPNT 27 (SUTURE) ×3 IMPLANT
SUT VIC AB 3-0 SH 27 (SUTURE) ×2
SUT VIC AB 3-0 SH 27X BRD (SUTURE) ×2 IMPLANT
SUT VICRYL 4-0 PS2 18IN ABS (SUTURE) ×2 IMPLANT
TOWEL OR 17X24 6PK STRL BLUE (TOWEL DISPOSABLE) ×4 IMPLANT
TOWEL OR 17X26 10 PK STRL BLUE (TOWEL DISPOSABLE) ×2 IMPLANT
TRAY FOLEY CATH 14FRSI W/METER (CATHETERS) ×2 IMPLANT
WATER STERILE IRR 1000ML POUR (IV SOLUTION) ×2 IMPLANT

## 2013-04-03 NOTE — Anesthesia Procedure Notes (Signed)
Procedure Name: Intubation Date/Time: 04/03/2013 7:52 AM Performed by: Elon Alas Pre-anesthesia Checklist: Patient identified, Emergency Drugs available, Suction available, Patient being monitored and Timeout performed Patient Re-evaluated:Patient Re-evaluated prior to inductionOxygen Delivery Method: Circle system utilized Preoxygenation: Pre-oxygenation with 100% oxygen Intubation Type: IV induction Ventilation: Mask ventilation without difficulty Laryngoscope Size: Mac and 4 Grade View: Grade II Tube type: Oral Tube size: 7.5 mm Number of attempts: 1 Airway Equipment and Method: Stylet and LTA kit utilized Placement Confirmation: breath sounds checked- equal and bilateral,  positive ETCO2 and ETT inserted through vocal cords under direct vision Secured at: 23 cm Tube secured with: Tape Dental Injury: Teeth and Oropharynx as per pre-operative assessment

## 2013-04-03 NOTE — Preoperative (Signed)
Beta Blockers   Reason not to administer Beta Blockers:Not Applicable 

## 2013-04-03 NOTE — Op Note (Signed)
[facility] [place]  Patient name: Ryan [name] MRN: [id number] DOB: 05-26-1966 Sex: male  04/03/2013 Pre-operative Diagnosis: Left arm claudication with left subclavian artery occlusion Post-operative diagnosis:  Same Surgeon:  Jorge Ny Assistants:  Della Goo Procedure:   Left carotid subclavian bypass with 8 mm dacryon Anesthesia:  Gen. Blood Loss:  See anesthesia record Specimens:  None  Findings:  Very difficult exposure due to 2 the depth of the wound.  Indications:  The patient was emergently treated for an acute MI approximately one year ago. During his hospital stay he was found to have an occluded aorta. He also was found to have an occluded left subclavian artery. He was very symptomatic from both. He was maintained on July antiplatelet therapy because of his drug-eluting stent. This had to be done for one year. Once that one year. Past, he underwent aortobifemoral bypass grafting. He has recovered from that and continues to complain about early fatigue in his left arm which affects him on daily basis. He is here today for left carotid subclavian bypass  Procedure:  The patient was identified in the holding area and taken to [facility] OR ROOM 16  The patient was then placed supine on the table. general anesthesia was administered.  The patient was prepped and draped in the usual sterile fashion.  A time out was called and antibiotics were administered.  A supraclavicular incision was made 1 fingerbreadth above the clavicle. Cautery was used to divide the subcutaneous tissue and the platysma muscle. Subplatysmal flaps were created. I then encountered the sternal and clavicular heads of the sternocleidomastoid. In order to obtain adequate exposure I had to divide the clavicular head of the sternocleidomastoid muscle. Next, I identified the internal jugular vein. This was dissected free. I also identified the vagus nerve which was anterior and medial. This  was dissected out and fully protected. The common carotid artery was then exposed and encircled with a vessel loop. This was a very soft artery. It was on the small side from a diameter perspective.  At this point attention was turned towards the dissection of the subclavian artery. I proceeded on the lateral side of the internal jugular vein. The fat pad was reflected laterally. I used 3-0 silk ties predominantly to mobilize the fat pad. The patient had a very prominent thoracic duct which was divided between 2-0 silk ties. Ultimately, I was able to visualize the anterior scalene muscle. The phrenic nerve coursed over the anterior surface of this. I felt it was best to reflect the nerve medially. I then proceeded with cautery division of the anterior scalene muscle. Once the muscle was fully divided I had some difficulty identifying the subclavian artery. The brachial plexus was visualized, however it was minimally manipulated. The subclavian artery was a identified deep within the wound, well below the clavicle. It was somewhat difficult to expose. I was able to expose it distally. I identified the thyrocervical trunk as well as the cost the cervical trunk. This was the best level for the subclavian anastomosis. At this point the patient was fully heparinized. After the heparin circulated, the subclavian artery was occluded as were its side branches. A #11 blade was used to make an arteriotomy which was opened longitudinally with Potts scissors. I selected an 8 mm dacryon graft. This was sewn end to side with a running 5-0 Prolene. One repair stitch with a pledget was required for hemostasis otherwise, this anastomosis went very well. The graft  was then brought posterior to the jugular vein. A site was selected on the posterior lateral side of the common carotid artery. The common carotid artery was occluded. A #11 blade was used to make an arteriotomy. A #5 punch was used to open the arteriotomy. The graft was  cut to the appropriate length, and a hand to side anastomosis was created with running 5-0 Prolene. After the appropriate flushing maneuvers were performed, the anastomosis was completed. Sequential at the clamping was performed. The last clamp to be released was on the distal common carotid artery. Hand-held Doppler was used to evaluate the signal within the carotid artery proximal and distal to the anastomosis as well as the subclavian artery distal to the anastomosis. All had excellent signals. There was an copiously irrigated. 50 mg of protamine were administered. Once hemostasis was satisfactory, I placed CoSeal within the wound. I then reapproximated the divided portion of the clavicular head of the sternocleidomastoid. This was done with 2-0 Vicryl. The platysma muscle was reapproximated with 3-0 Vicryl the skin was closed with 4-0 Vicryl. Dermabond placed on the skin. There were no complications. The patient was extubated and found to be neurologically intact.   Disposition:  To PACU in stable condition.   Juleen China, M.D. [facility] [place] Office: [phone] Pager:  [phone]

## 2013-04-03 NOTE — Anesthesia Postprocedure Evaluation (Signed)
  Anesthesia Post-op Note  Patient: Ryan [name]  Procedure(s) Performed: Procedure(s): BYPASS GRAFT CAROTID-SUBCLAVIAN (Left)  Patient Location: PACU  Anesthesia Type:General  Level of Consciousness: awake, alert  and oriented  Airway and Oxygen Therapy: Patient Spontanous Breathing and Patient connected to nasal cannula oxygen  Post-op Pain: none  Post-op Assessment: Post-op Vital signs reviewed, Patient's Cardiovascular Status Stable, Respiratory Function Stable, Patent Airway and Pain level controlled  Post-op Vital Signs: stable  Complications: No apparent anesthesia complications

## 2013-04-03 NOTE — Interval H&P Note (Signed)
History and Physical Interval Note:  04/03/2013 7:22 AM  Ryan [name]  has presented today for surgery, with the diagnosis of Left subclavian stenosis    The various methods of treatment have been discussed with the patient and family. After consideration of risks, benefits and other options for treatment, the patient has consented to  Procedure(s): BYPASS GRAFT CAROTID-SUBCLAVIAN (Left) as a surgical intervention .  The patient's history has been reviewed, patient examined, no change in status, stable for surgery.  I have reviewed the patient's chart and labs.  Questions were answered to the patient's satisfaction.     Sherrol Vicars IV, V. WELLS

## 2013-04-03 NOTE — Transfer of Care (Signed)
Immediate Anesthesia Transfer of Care Note  Patient: Ryan [name]  Procedure(s) Performed: Procedure(s): BYPASS GRAFT CAROTID-SUBCLAVIAN (Left)  Patient Location: PACU  Anesthesia Type:General  Level of Consciousness: awake, alert  and oriented  Airway & Oxygen Therapy: Patient Spontanous Breathing and Patient connected to nasal cannula oxygen  Post-op Assessment: Report given to PACU RN, Post -op Vital signs reviewed and stable, Patient moving all extremities X 4 and Patient able to stick tongue midline  Post vital signs: Reviewed and stable  Complications: No apparent anesthesia complications

## 2013-04-03 NOTE — Plan of Care (Signed)
Problem: Consults Goal: Diagnosis CEA/CES/AAA Stent Carotid Endarterectomy (CEA)     

## 2013-04-03 NOTE — Progress Notes (Signed)
Utilization review completed.  

## 2013-04-03 NOTE — H&P (View-Only) (Signed)
[facility] of    Patient name: Ryan [name] MRN: [id number] DOB: 1966-05-18 Sex: male     Chief Complaint  Patient presents with  . PAD    F/up with cta today at [facility]. Img   C/O Left arm weakness, numbness and hurting,duration1 plus year.  Marland Kitchen PVD    HISTORY OF PRESENT ILLNESS: The patient is back today for followup. I initially met him in March of 2013. He suffered a MI in January 2013, and at that time he had a drug-eluting stent placed to his LAD. During this process, an abdominal aortogram revealed aortic occlusion at the level of the renal arteries. He also has a left subclavian artery occlusion. Because of his drug-eluting stent, I have not been able to proceed with surgical revascularization, because he has not been able to come off of his antiplatelet medication. He underwent aortobifemoral bypass grafting and has nearly recovered from his operation. He has a known occluded left subclavian artery which causes him significant left arm symptoms. He states that with minimal activity his left arm gets out. He will get numbness in his fingertips. He did not have any neurologic symptoms. He has been off of his Effient for a few days because he ran out. He did not restart it, hoping that he get his operation done.   Past Medical History  Diagnosis Date  . MI (myocardial infarction)     AMI 1/13 - complicated by VT/Tosades  . Chronic back pain   . Anal fissure   . Rectal polyp   . Mixed hyperlipidemia   . PAD (peripheral artery disease)   . Hypertension     "patient states does not have primary doctor"  . Kidney stones   . GERD (gastroesophageal reflux disease)     "takes tums"  . Chronic low back pain     hx of  . Coronary atherosclerosis of native coronary artery     DES LAD 1/13, LVEF 35%, sees Dr. Sanjuana Kava  . Diabetes mellitus without complication     "borderline diabetic"    Past Surgical History  Procedure Laterality Date  . Appendectomy    .  Diagnostic laparoscopy    . Anal fissurectomy    . Coronary angioplasty      stent placed Dec 12, 2011  . Aorta - bilateral femoral artery bypass graft N/A 01/16/2013    Procedure: AORTA BIFEMORAL BYPASS GRAFT;  Surgeon: Nada Libman, MD;  Location: [facility] OR;  Service: Vascular;  Laterality: N/A;    History   Social History  . Marital Status: Divorced    Spouse Name: N/A    Number of Children: N/A  . Years of Education: N/A   Occupational History  .  Other   Social History Main Topics  . Smoking status: Current Every Day Smoker -- 0.50 packs/day for 25 years    Types: Cigarettes  . Smokeless tobacco: Never Used     Comment: pt states he is taking chantix  . Alcohol Use: 0.0 oz/week     Comment: Weekends  . Drug Use: No  . Sexually Active: Not on file   Other Topics Concern  . Not on file   Social History Narrative   [employer] - physical labor  - works night shift          Family History  Problem Relation Age of Onset  . Cancer Father     Lung  . Cancer Maternal Uncle  Colon  . Diabetes Mother   . COPD Mother     Allergies as of 03/31/2013 - Review Complete 03/31/2013  Allergen Reaction Noted  . Hydrocodone Nausea And Vomiting 08/29/2012    Current Outpatient Prescriptions on File Prior to Visit  Medication Sig Dispense Refill  . aspirin 81 MG tablet Take 162 mg by mouth daily.       . carvedilol (COREG) 6.25 MG tablet Take 1 tablet (6.25 mg total) by mouth 2 (two) times daily with a meal.  60 tablet  6  . cephALEXin (KEFLEX) 500 MG capsule Take 1 capsule (500 mg total) by mouth 3 (three) times daily.  30 capsule  0  . cyclobenzaprine (FLEXERIL) 10 MG tablet Take 1 tablet (10 mg total) by mouth 2 (two) times daily as needed for muscle spasms.  20 tablet  0  . enalapril (VASOTEC) 5 MG tablet Take 1 tablet (5 mg total) by mouth 2 (two) times daily.  60 tablet  6  . glucose blood test strip Use to check blood sugar three time a day (fasting, at  lunch time and a bedtime; before injecting lantus)  100 each  12  . glucose monitoring kit (FREESTYLE) monitoring kit 1 each by Does not apply route as needed for other.  1 each  0  . insulin glargine (LANTUS) 100 UNIT/ML injection Inject 0.25 mLs (25 Units total) into the skin at bedtime.  3 mL  3  . insulin lispro (HUMALOG) 100 UNIT/ML injection Before each meal 3 times a day, 140-199 - 2 units, 200-250 - 4 units, 251-299 - 6 units,  300-349 - 8 units,  350 or above 10 units.  Can dispense Humalog pen, if needed dispense syringes and needles.  10 mL  2  . Insulin Pen Needle (1ST CHOICE PEN NEEDLES) 31G X 8 MM MISC Use to inject lantus at bedtime as instructed  100 each  1  . Lancets (FREESTYLE) lancets Use as instructed  100 each  12  . nitroGLYCERIN (NITROSTAT) 0.4 MG SL tablet Place 1 tablet (0.4 mg total) under the tongue every 5 (five) minutes as needed. For chest pain  25 tablet  3  . oxyCODONE-acetaminophen (PERCOCET/ROXICET) 5-325 MG per tablet Take 1 tablet by mouth every 6 (six) hours as needed for pain.  120 tablet  0  . prasugrel (EFFIENT) 10 MG TABS Take 1 tablet (10 mg total) by mouth daily.  30 tablet  6  . simvastatin (ZOCOR) 40 MG tablet Take 1 tablet (40 mg total) by mouth every evening.  30 tablet  5  . spironolactone (ALDACTONE) 25 MG tablet Take 0.5 tablets (12.5 mg total) by mouth daily.  30 tablet  6  . varenicline (CHANTIX) 0.5 MG tablet Take 1 tablet (0.5 mg total) by mouth 2 (two) times daily.  30 tablet  0   No current facility-administered medications on file prior to visit.     REVIEW OF SYSTEMS: Please see history of present illness, otherwise all systems are negative as documented by the patient in the encounter form  PHYSICAL EXAMINATION:   Vital signs are BP 133/90  Pulse 65  Resp 16  Ht 5' 8.5" (1.74 m)  Wt 203 lb (92.08 kg)  BMI 30.41 kg/m2  SpO2 100% General: The patient appears their stated age. HEENT:  No gross abnormalities Pulmonary:  Non  labored breathing Abdomen: Soft and non-tender midline incision well healed. No hernia Musculoskeletal: There are no major deformities. Neurologic: No focal weakness or  paresthesias are detected, Skin: There are no ulcer or rashes noted. Psychiatric: The patient has normal affect. Cardiovascular: There is a regular rate and rhythm without significant murmur appreciated. No carotid bruits   Diagnostic Studies I have reviewed his CT angiogram of the head and neck. This shows left subclavian artery occlusion  Assessment: Left subclavian steal  Plan: The patient will be scheduled for a left carotid to subclavian bypass graft this Thursday. He has been off of his Effient for several days. We discussed the risks benefits of the operation including injury to the vagus or phrenic nerve. We also discussed the possibility of lymphatic leak.  Jorge Ny, M.D. [facility] [place] Office: [phone] Pager:  [phone]

## 2013-04-03 NOTE — OR Nursing (Signed)
Family updated at 1132, via the volunteer.

## 2013-04-03 NOTE — Anesthesia Preprocedure Evaluation (Addendum)
Anesthesia Evaluation  Patient identified by MRN, date of birth, ID band Patient awake    Reviewed: Allergy & Precautions, H&P , NPO status , Patient's Chart, lab work & pertinent test results, reviewed documented beta blocker date and time   Airway Mallampati: II TM Distance: >3 FB Neck ROM: Full    Dental  (+) Dental Advisory Given, Edentulous Upper and Poor Dentition   Pulmonary former smoker,  breath sounds clear to auscultation        Cardiovascular hypertension, Pt. on home beta blockers + CAD, + Past MI and + Peripheral Vascular Disease Rhythm:Regular Rate:Normal  Left subclavian stenosis   Neuro/Psych    GI/Hepatic GERD-  ,  Endo/Other  diabetes, Insulin Dependent  Renal/GU      Musculoskeletal   Abdominal   Peds  Hematology   Anesthesia Other Findings   Reproductive/Obstetrics                         Anesthesia Physical Anesthesia Plan  ASA: III  Anesthesia Plan: General   Post-op Pain Management:    Induction: Intravenous  Airway Management Planned: Oral ETT  Additional Equipment: Arterial line  Intra-op Plan:   Post-operative Plan: Extubation in OR  Informed Consent: I have reviewed the patients History and Physical, chart, labs and discussed the procedure including the risks, benefits and alternatives for the proposed anesthesia with the patient or authorized representative who has indicated his/her understanding and acceptance.   Dental advisory given  Plan Discussed with: Anesthesiologist, Surgeon and CRNA  Anesthesia Plan Comments: (L. Subclavian stenosis with steal Type 2 DM glucose 135  CAD, acute STEMI with torsades,  S/P PTCA with stent LAD No angina EF 55% mild apical ischemia by [facility] 9/13)     Anesthesia Quick Evaluation

## 2013-04-04 ENCOUNTER — Telehealth: Payer: Self-pay | Admitting: Surgery

## 2013-04-04 LAB — URINALYSIS, ROUTINE W REFLEX MICROSCOPIC
Ketones, ur: NEGATIVE mg/dL
Nitrite: NEGATIVE
Protein, ur: NEGATIVE mg/dL
Urobilinogen, UA: 0.2 mg/dL (ref 0.0–1.0)
pH: 6.5 (ref 5.0–8.0)

## 2013-04-04 LAB — CBC
HCT: 37.3 % — ABNORMAL LOW (ref 39.0–52.0)
MCHC: 34.9 g/dL (ref 30.0–36.0)
MCV: 85.6 fL (ref 78.0–100.0)
RDW: 14.3 % (ref 11.5–15.5)
WBC: 19.9 10*3/uL — ABNORMAL HIGH (ref 4.0–10.5)

## 2013-04-04 LAB — BASIC METABOLIC PANEL
BUN: 8 mg/dL (ref 6–23)
Chloride: 98 mEq/L (ref 96–112)
Creatinine, Ser: 0.9 mg/dL (ref 0.50–1.35)
GFR calc Af Amer: >90 mL/min (ref >90)

## 2013-04-04 LAB — GLUCOSE, CAPILLARY
Glucose-Capillary: 122 mg/dL — ABNORMAL HIGH (ref 70–99)
Glucose-Capillary: 149 mg/dL — ABNORMAL HIGH (ref 70–99)
Glucose-Capillary: 171 mg/dL — ABNORMAL HIGH (ref 70–99)
Glucose-Capillary: 121 mg/dL — ABNORMAL HIGH (ref 70–99)

## 2013-04-04 LAB — URINE MICROSCOPIC-ADD ON

## 2013-04-04 MED ORDER — OXYCODONE-ACETAMINOPHEN 5-325 MG PO TABS: 1.0000 | ORAL_TABLET | ORAL | Status: DC | PRN

## 2013-04-04 MED ORDER — INSULIN ASPART 100 UNIT/ML ~~LOC~~ SOLN
0.0000 [IU] | Freq: Three times a day (TID) | SUBCUTANEOUS | Status: DC
  Administered 2013-04-04: 2 [IU] via SUBCUTANEOUS

## 2013-04-04 MED ORDER — CEPHALEXIN 500 MG PO CAPS
500.0000 mg | ORAL_CAPSULE | Freq: Four times a day (QID) | ORAL | Status: DC
  Administered 2013-04-04 – 2013-04-05 (×2): 500 mg via ORAL
  Filled 2013-04-04 (×5): qty 1

## 2013-04-04 MED ORDER — CALCIUM CARBONATE ANTACID 500 MG PO CHEW
1.0000 | CHEWABLE_TABLET | ORAL | Status: DC | PRN
Start: 2013-04-04 — End: 2013-04-05
  Administered 2013-04-04: 200 mg via ORAL
  Filled 2013-04-04: qty 1

## 2013-04-04 MED ORDER — PNEUMOCOCCAL VAC POLYVALENT 25 MCG/0.5ML IJ INJ
0.5000 mL | INJECTION | INTRAMUSCULAR | Status: DC
  Filled 2013-04-04: qty 0.5

## 2013-04-04 NOTE — Telephone Encounter (Signed)
LVM, sent letter - kf °

## 2013-04-04 NOTE — Progress Notes (Signed)
VVS Progress Note: Pain Mgmnt.  Saw pt this am. Had not given pt pain meds for home as home meds RX is for 120 tabs of percocet 1 tab Q6 hours for pain with no refills.  Pt has a pain contract and receives 120 tabs of percocet per month.  Pt states this am " My pain meds are at my daughters house in [place]".   D/W Dr. Myra Gianotti - we will give pt 20(twenty) percocet to go home with today as prescribed.  We will have further pain meds Managed by MD with which he has a pain contract.

## 2013-04-04 NOTE — Progress Notes (Signed)
Patient with temp of 102.1 MD paged and made aware orders received will monitor Pt closely.

## 2013-04-04 NOTE — Telephone Encounter (Signed)
Message copied by Margaretmary Eddy on Fri Apr 04, 2013 11:25 AM ------      Message from: Marlowe Shores      Created: Fri Apr 04, 2013  9:04 AM       2 week F/U Carotid SCA bypass- [place] ------

## 2013-04-04 NOTE — Progress Notes (Addendum)
[facility] Progress Note  04/04/2013 7:21 AM 1 Day Post-Op  Subjective:  Pre op symptoms improved.  C/o soreness.  Has not ambulated or been OOB.  Won't ambulate until foley out.  Tm 102.7 now afebrile HR 80's-120's 100's-160's systolic 97% 2LO2NC  Filed Vitals:   04/04/13 0400  BP: 103/71  Pulse: 109  Temp: 98.1 F (36.7 C)  Resp: 26    Physical Exam: Incisions:  C/d/i-minimal swelling around incision Extremities:  + palpable radial and ulnar pulse left  CBC    Component Value Date/Time   WBC 19.9* 04/04/2013 0358   RBC 4.36 04/04/2013 0358   HGB 13.0 04/04/2013 0358   HCT 37.3* 04/04/2013 0358   PLT 210 04/04/2013 0358   MCV 85.6 04/04/2013 0358   MCH 29.8 04/04/2013 0358   MCHC 34.9 04/04/2013 0358   RDW 14.3 04/04/2013 0358   LYMPHSABS 4.5* 08/29/2012 1447   MONOABS 1.0 08/29/2012 1447   EOSABS 0.3 08/29/2012 1447   BASOSABS 0.2* 08/29/2012 1447    BMET    Component Value Date/Time   NA 134* 04/04/2013 0358   K 3.4* 04/04/2013 0358   CL 98 04/04/2013 0358   CO2 26 04/04/2013 0358   GLUCOSE 128* 04/04/2013 0358   BUN 8 04/04/2013 0358   CREATININE 0.90 04/04/2013 0358   CALCIUM 8.6 04/04/2013 0358   GFRNONAA >90 04/04/2013 0358   GFRAA >90 04/04/2013 0358    INR    Component Value Date/Time   INR 0.91 04/02/2013 0817     Intake/Output Summary (Last 24 hours) at 04/04/13 0721 Last data filed at 04/04/13 0400  Gross per 24 hour  Intake   2800 ml  Output   4100 ml  Net  -1300 ml     Assessment/Plan:  47 y.o. male is s/p:  Left carotid subclavian bypass with 8 mm dacryon   1 Day Post-Op  -pre op symptoms improved-pt has palpable left radial and ulnar pulse. -Fever and increased WBC-U/A negative.  Blood cultures?  Will d/w Dr. Myra Gianotti.  Possibly atelectasis-will get IS to room. -pt needs to mobilize-OOB to chair -d/c foley -d/c IVF -possibly home later today.   Doreatha Massed, PA-C [facility] [phone] 04/04/2013 7:21 AM

## 2013-04-05 LAB — GLUCOSE, CAPILLARY

## 2013-04-05 MED ORDER — CEPHALEXIN 500 MG PO CAPS: 500.0000 mg | ORAL_CAPSULE | Freq: Four times a day (QID) | ORAL | Status: DC

## 2013-04-05 NOTE — Progress Notes (Signed)
D/c instructions reviewed with pt and family member at his request. Copy of instructions and scripts given to pt. Pt was d/c'd with belongings with family, pt declined wheelchair; pt has been walking independently with steady gait on the unit this am without any difficulty, and wanted to walk verses ride out in wheelchair.

## 2013-04-05 NOTE — Progress Notes (Signed)
I agree with the above. The patient has a palpable left radial pulse. His incision is intact. The swelling in the left neck has decreased as has the erythema. I will send him home on one week of Keflex.  Durene Cal

## 2013-04-05 NOTE — Progress Notes (Signed)
[facility] Progress Note  04/05/2013 7:25 AM 2 Days Post-Op  Subjective:  C/o of soreness.  Pre-op symptoms improved.  Tm 99.3 (now) HR 60's-100 100-160 systolic 95% RA  Filed Vitals:   04/05/13 0322  BP: 161/89  Pulse: 106  Temp: 99.3 F (37.4 C)  Resp: 20    Physical Exam: Incisions:  C/d/i.  There is some swelling and erythema around incision. Extremities:  + palpable left radial and ulnar pulse.  CBC    Component Value Date/Time   WBC 19.9* 04/04/2013 0358   RBC 4.36 04/04/2013 0358   HGB 13.0 04/04/2013 0358   HCT 37.3* 04/04/2013 0358   PLT 210 04/04/2013 0358   MCV 85.6 04/04/2013 0358   MCH 29.8 04/04/2013 0358   MCHC 34.9 04/04/2013 0358   RDW 14.3 04/04/2013 0358   LYMPHSABS 4.5* 08/29/2012 1447   MONOABS 1.0 08/29/2012 1447   EOSABS 0.3 08/29/2012 1447   BASOSABS 0.2* 08/29/2012 1447    BMET    Component Value Date/Time   NA 134* 04/04/2013 0358   K 3.4* 04/04/2013 0358   CL 98 04/04/2013 0358   CO2 26 04/04/2013 0358   GLUCOSE 128* 04/04/2013 0358   BUN 8 04/04/2013 0358   CREATININE 0.90 04/04/2013 0358   CALCIUM 8.6 04/04/2013 0358   GFRNONAA >90 04/04/2013 0358   GFRAA >90 04/04/2013 0358    INR    Component Value Date/Time   INR 0.91 04/02/2013 0817     Intake/Output Summary (Last 24 hours) at 04/05/13 0725 Last data filed at 04/05/13 0530  Gross per 24 hour  Intake    890 ml  Output   2700 ml  Net  -1810 ml     Assessment/Plan:  47 y.o. male is s/p:  Left carotid subclavian bypass with 8 mm dacryon   2 Days Post-Op  -pt now on Keflex for possible infection at incision - will continue 500 mg qid x 7 days after discharge. Rx given. -pt has ambulated and voided -possibly home today per Dr. Vista Lawman, PA-C [facility] [phone] 04/05/2013 7:25 AM

## 2013-04-05 NOTE — Progress Notes (Signed)
I agree with the above, the patient is stable for discharge. He'll be continued on antibiotics for erythema around his incision.  Durene Cal

## 2013-04-07 ENCOUNTER — Encounter (HOSPITAL_COMMUNITY): Payer: Self-pay | Admitting: Surgery

## 2013-04-07 ENCOUNTER — Encounter: Payer: Self-pay | Admitting: *Deleted

## 2013-04-07 LAB — GLUCOSE, CAPILLARY: Glucose-Capillary: 100 mg/dL — ABNORMAL HIGH (ref 70–99)

## 2013-04-07 NOTE — Progress Notes (Signed)
Follow up on weekend answering service messages;  Dr. Myra Gianotti said he spoke to Mr. Ryan [name] regarding his drainage while he was at the hospital yesterday 04-06-13. His questions were answered.

## 2013-04-07 NOTE — Discharge Summary (Signed)
[facility] Discharge Summary  Ryan [name] March 01, 1966 47 y.o. male  [id number]  Admission Date: 04/03/2013  Discharge Date: 04/06/13  Physician: No att. providers found  Admission Diagnosis: Left subclavian stenosis     HPI:   This is a 47 y.o. male is back today for followup. I initially met him in March of 2013. He suffered a MI in January 2013, and at that time he had a drug-eluting stent placed to his LAD. During this process, an abdominal aortogram revealed aortic occlusion at the level of the renal arteries. He also has a left subclavian artery occlusion. Because of his drug-eluting stent, I have not been able to proceed with surgical revascularization, because he has not been able to come off of his antiplatelet medication. He underwent aortobifemoral bypass grafting and has nearly recovered from his operation. He has a known occluded left subclavian artery which causes him significant left arm symptoms. He states that with minimal activity his left arm gets out. He will get numbness in his fingertips. He did not have any neurologic symptoms. He has been off of his Effient for a few days because he ran out. He did not restart it, hoping that he get his operation done.     Hospital Course:  The patient was admitted to the hospital and taken to the operating room on 04/03/2013 and underwent: Left carotid subclavian bypass with 8 mm dacryon Findings during the procedure was very difficult to expose due to the depth of the wound.    The pt tolerated the procedure well and was transported to the PACU in good condition.   By POD 1, he c/o soreness.  His pre-op symptoms had improved.  He did have a Tm of 102.7 and his U/A was negative.  He was given an IS and more mobilization.  He was started on prophylactic ABx for redness around his incision.  His foley was removed and he was able to void without difficulty.  Pt Had not given pt pain meds for home as home meds RX is  for 120 tabs of percocet 1 tab Q6 hours for pain with no refills.  Pt has a pain contract and receives 120 tabs of percocet per month.  Pt states this am " My pain meds are at my daughters house in [place]".  D/W Dr. Myra Gianotti - we will give pt 20(twenty) percocet to go home with today as prescribed. We will have further pain meds Managed by MD with which he has a pain contract.  The remainder of the hospital course consisted of increasing mobilization and increasing intake of solids without difficulty.  CBC    Component Value Date/Time   WBC 19.9* 04/04/2013 0358   RBC 4.36 04/04/2013 0358   HGB 13.0 04/04/2013 0358   HCT 37.3* 04/04/2013 0358   PLT 210 04/04/2013 0358   MCV 85.6 04/04/2013 0358   MCH 29.8 04/04/2013 0358   MCHC 34.9 04/04/2013 0358   RDW 14.3 04/04/2013 0358   LYMPHSABS 4.5* 08/29/2012 1447   MONOABS 1.0 08/29/2012 1447   EOSABS 0.3 08/29/2012 1447   BASOSABS 0.2* 08/29/2012 1447    BMET    Component Value Date/Time   NA 134* 04/04/2013 0358   K 3.4* 04/04/2013 0358   CL 98 04/04/2013 0358   CO2 26 04/04/2013 0358   GLUCOSE 128* 04/04/2013 0358   BUN 8 04/04/2013 0358   CREATININE 0.90 04/04/2013 0358   CALCIUM 8.6 04/04/2013 0358   GFRNONAA >90 04/04/2013  4098   GFRAA >90 04/04/2013 0358     Discharge Instructions:   The patient is discharged to home with extensive instructions on wound care and progressive ambulation.  They are instructed not to drive or perform any heavy lifting until returning to see the physician in his office.  Discharge Orders   Future Appointments Provider Department Dept Phone   04/21/2013 2:15 PM Nada Libman, MD [facility] [phone]   Future Orders Complete By Expires     CAROTID Sugery: Call MD for difficulty swallowing or speaking; weakness in arms or legs that is a new symtom; severe headache.  If you have increased swelling in the neck and/or  are having difficulty breathing, CALL 911  As directed     Call MD for:  redness,  tenderness, or signs of infection (pain, swelling, bleeding, redness, odor or green/yellow discharge around incision site)  As directed     Call MD for:  severe or increased pain, loss or decreased feeling  in affected limb(s)  As directed     Call MD for:  temperature >100.5  As directed     Driving Restrictions  As directed     Comments:      No driving for 2 weeks    Increase activity slowly  As directed     Comments:      Walk with assistance use walker or cane as needed    Lifting restrictions  As directed     Comments:      No lifting for 4 weeks    May shower   As directed     Scheduling Instructions:      Saturday    No dressing needed  As directed     Resume previous diet  As directed     may wash over wound with mild soap and water  As directed        Discharge Diagnosis:  Left subclavian stenosis    Secondary Diagnosis: Patient Active Problem List   Diagnosis Date Noted  . PVD (peripheral vascular disease) 03/31/2013  . Numbness and tingling of right arm 03/31/2013  . Pain in joint, upper arm 03/31/2013  . Peripheral vascular disease, unspecified 12/16/2012  . Occlusion and stenosis of carotid artery without mention of cerebral infarction 08/26/2012  . Chronic total occlusion of artery of the extremities 02/26/2012  . PAD (peripheral artery disease) 01/24/2012  . Ischemic cardiomyopathy 01/24/2012  . Cardiomyopathy secondary 12/26/2011  . Claudication 12/26/2011  . Mixed hyperlipidemia 12/14/2011  . Acute myocardial infarction of other anterior wall, subsequent episode of care 12/12/2011  . Tobacco abuse   . Coronary atherosclerosis of native coronary artery    Past Medical History  Diagnosis Date  . Anal fissure   . Rectal polyp   . Mixed hyperlipidemia     takes Simvastatin daily  . PAD (peripheral artery disease)   . GERD (gastroesophageal reflux disease)     "takes tums"  . Hypertension     takes Enalapril and Coreg daily   . Coronary  atherosclerosis of native coronary artery     takes Effient but on hold for surgery  . MI (myocardial infarction)     AMI 1/13 - complicated by VT/Tosades  . Bruises easily     d/t being on Effient  . History of kidney stones   . Diabetes mellitus without complication     takes Lantus daily  . History of blood transfusion  no abnormal reaction noted       Medication List    TAKE these medications       aspirin 81 MG tablet  Take 162 mg by mouth daily.     carvedilol 6.25 MG tablet  Commonly known as:  COREG  Take 1 tablet (6.25 mg total) by mouth 2 (two) times daily with a meal.     cephALEXin 500 MG capsule  Commonly known as:  KEFLEX  Take 1 capsule (500 mg total) by mouth 4 (four) times daily.     enalapril 5 MG tablet  Commonly known as:  VASOTEC  Take 1 tablet (5 mg total) by mouth 2 (two) times daily.     freestyle lancets  Use as instructed     glucose blood test strip  Use to check blood sugar three time a day (fasting, at lunch time and a bedtime; before injecting lantus)     glucose monitoring kit monitoring kit  1 each by Does not apply route as needed for other.     insulin glargine 100 UNIT/ML injection  Commonly known as:  LANTUS  Inject 20 Units into the skin at bedtime.     Insulin Pen Needle 31G X 8 MM Misc  Commonly known as:  1ST CHOICE PEN NEEDLES  Use to inject lantus at bedtime as instructed     nitroGLYCERIN 0.4 MG SL tablet  Commonly known as:  NITROSTAT  Place 0.4 mg under the tongue every 5 (five) minutes as needed for chest pain.     oxyCODONE-acetaminophen 5-325 MG per tablet  Commonly known as:  PERCOCET/ROXICET  Take 1-2 tablets by mouth every 4 (four) hours as needed.     prasugrel 10 MG Tabs  Commonly known as:  EFFIENT  Take 1 tablet (10 mg total) by mouth daily.     simvastatin 40 MG tablet  Commonly known as:  ZOCOR  Take 1 tablet (40 mg total) by mouth every evening.     spironolactone 25 MG tablet  Commonly  known as:  ALDACTONE  Take 0.5 tablets (12.5 mg total) by mouth daily.        Percocet #20 No Refill  Disposition: home  Patient's condition: is Good  Follow up: 1. Dr. Myra Gianotti in 2 weeks   Doreatha Massed, PA-C [facility] [phone] 04/07/2013  1:47 PM

## 2013-04-08 ENCOUNTER — Telehealth: Payer: Self-pay

## 2013-04-08 NOTE — Telephone Encounter (Signed)
Phone call ret'd to pt. to discuss symptoms.  States he continues to have a "thin, almost clear drainage" from the left neck/upper chest incision; reports having to change his dressing about every 3-4 hrs., and when he wakes-up in the morning, the bandage is soaked and very heavy with drainage.  Denies any opening in the incision.  Denies any fever/chills.  Encouraged to monitor incision, and to keep area clean and dry, changing the bandage as often as necessary; advised to report any redness/ inflammation, opening of incision, and fever/chills.  Requesting pain medication refill.  Noted in the discharge summary, the Percocet # 20 was all that pt. Was to receive initially from the vascular surgeon, due to pt. being in a contract with pain management.  Pt. Denies that he is in a contract with pain management, and states that ended about a month ago.  States he is waiting on a call from Dr. Albertina Parr office for an appt.  Encour. Pt. To call that office and to try to schedule appt.  Presently has appt. To f/u w/ Dr. Myra Gianotti on 04/21/13.  Advised will make Dr. Myra Gianotti aware of pt's complaints.

## 2013-04-09 MED ORDER — OXYCODONE-ACETAMINOPHEN 5-325 MG PO TABS: ORAL_TABLET | ORAL | Status: DC

## 2013-04-09 NOTE — Telephone Encounter (Signed)
Rec'd return call from Dr. Myra Gianotti at 5:00 PM 04/08/13.  Advised may order Percocet 5/325mg , 1-2 tablets q 6 hrs prn; # 20; no refills.  Notified pt. of the order for Percocet; advised that this is the last Rx that we can give for his post-operative pain; per Dr. Myra Gianotti future prescriptions will need to come from his medical doctor.  Pt. verb. understanding.  Pt. knows to come to office to pick up prescription.

## 2013-04-09 NOTE — Addendum Note (Signed)
Addended by: Phillips Odor on: 04/09/2013 11:03 AM   Modules accepted: Orders

## 2013-04-10 NOTE — Discharge Summary (Signed)
I agree with the above  Wells Brooklee Michelin 

## 2013-04-11 ENCOUNTER — Emergency Department (HOSPITAL_COMMUNITY)
Admission: EM | Admit: 2013-04-11 | Discharge: 2013-04-11 | Disposition: A | Discharge status: Home / Self Care | Payer: Medicaid Other | Attending: Emergency Medicine | Admitting: Emergency Medicine

## 2013-04-11 ENCOUNTER — Telehealth: Payer: Self-pay

## 2013-04-11 ENCOUNTER — Encounter (HOSPITAL_COMMUNITY): Payer: Self-pay | Admitting: Emergency Medicine

## 2013-04-11 DIAGNOSIS — Z7902 Long term (current) use of antithrombotics/antiplatelets: Secondary | ICD-10-CM | POA: Insufficient documentation

## 2013-04-11 DIAGNOSIS — Z7982 Long term (current) use of aspirin: Secondary | ICD-10-CM | POA: Insufficient documentation

## 2013-04-11 DIAGNOSIS — M542 Cervicalgia: Secondary | ICD-10-CM

## 2013-04-11 DIAGNOSIS — E7800 Pure hypercholesterolemia, unspecified: Secondary | ICD-10-CM | POA: Insufficient documentation

## 2013-04-11 DIAGNOSIS — Z79899 Other long term (current) drug therapy: Secondary | ICD-10-CM | POA: Insufficient documentation

## 2013-04-11 DIAGNOSIS — I2510 Atherosclerotic heart disease of native coronary artery without angina pectoris: Secondary | ICD-10-CM | POA: Insufficient documentation

## 2013-04-11 DIAGNOSIS — I252 Old myocardial infarction: Secondary | ICD-10-CM | POA: Insufficient documentation

## 2013-04-11 DIAGNOSIS — Z8679 Personal history of other diseases of the circulatory system: Secondary | ICD-10-CM | POA: Insufficient documentation

## 2013-04-11 DIAGNOSIS — Z87442 Personal history of urinary calculi: Secondary | ICD-10-CM | POA: Insufficient documentation

## 2013-04-11 DIAGNOSIS — Z794 Long term (current) use of insulin: Secondary | ICD-10-CM | POA: Insufficient documentation

## 2013-04-11 DIAGNOSIS — K219 Gastro-esophageal reflux disease without esophagitis: Secondary | ICD-10-CM | POA: Insufficient documentation

## 2013-04-11 DIAGNOSIS — Z8719 Personal history of other diseases of the digestive system: Secondary | ICD-10-CM | POA: Insufficient documentation

## 2013-04-11 DIAGNOSIS — I10 Essential (primary) hypertension: Secondary | ICD-10-CM | POA: Insufficient documentation

## 2013-04-11 DIAGNOSIS — F17200 Nicotine dependence, unspecified, uncomplicated: Secondary | ICD-10-CM | POA: Insufficient documentation

## 2013-04-11 DIAGNOSIS — E119 Type 2 diabetes mellitus without complications: Secondary | ICD-10-CM | POA: Insufficient documentation

## 2013-04-11 NOTE — Progress Notes (Signed)
Pt presents to ED today with c/o pressure over where he had his surgery.  He underwent left carotid to subclavian bypass with 8 mm Dacron graft on 04/03/13.    He states that it started draining yellow, clear fluid the day after he got home.  He states that this did not look like pus.  He says that this stopped draining 1-2 days ago and now it is swollen at incision with pressure.  He denies any trouble swallowing or breathing.  His pre operative symptoms continue to be resolved.   His incision is healing nicely.  The area around the incision is swollen and is somewhat fluctuant.   He has a palpable left radial pulse.  Dr. Imogene Burn will be in to see the pt shortly.  May need I&D of area.  Will d/w Dr. Imogene Burn.  Doreatha Massed, PA-C 04/11/2013 2:25 PM  Addendum  I have independently interviewed and examined the patient, and I agree with the physician assistant's findings.  Patient has had no fever and chills and drainage has ceased from the left supraclavicular incision.  On exam, there is a slightly erythematous ballotable fluid collection beneath the supraclavicular incision.  There is no significant tenderness to palpation or streaking from this site.  I am reluctant to drainage this fluid collection given the presence of a Dacron carotid-subclavian bypass graft beneath due to risk of seeding the graft.  As precaution, I'm adding Cipro 500 mg 1 po BID x 10 day to his regimen.  He is complaining of some pain due to the pressure of the fluid collection so we're issuing him an additional Rx of Oxycodone 1-2 PO q4-6 hr #30.  The pt will follow up on Monday with Dr. Myra Gianotti.   Leonides Sake, MD [facility]: [phone] Pager: [phone]  04/11/2013, 2:53 PM

## 2013-04-11 NOTE — ED Notes (Signed)
Patient is resting comfortably. 

## 2013-04-11 NOTE — ED Provider Notes (Signed)
History     CSN: [id number]  Arrival date & time 04/11/13  1307   First MD Initiated Contact with Patient 04/11/13 1315      Chief Complaint  Patient presents with  . Neck Pain    (Consider location/radiation/quality/duration/timing/severity/associated sxs/prior treatment) Patient is a 47 y.o. male presenting with neck pain. The history is provided by the patient (the pt started with left side neck swelling yesterday.  he had neck surgery last week.). No language interpreter was used.  Neck Pain Pain location:  L side Quality:  Aching Pain radiates to:  Does not radiate Pain severity:  Mild Pain is:  Same all the time Onset quality:  Gradual Timing:  Constant Progression:  Waxing and waning Chronicity:  New Associated symptoms: no chest pain and no headaches     Past Medical History  Diagnosis Date  . Anal fissure   . Rectal polyp   . Mixed hyperlipidemia     takes Simvastatin daily  . PAD (peripheral artery disease)   . GERD (gastroesophageal reflux disease)     "takes tums"  . Hypertension     takes Enalapril and Coreg daily   . Coronary atherosclerosis of native coronary artery     takes Effient but on hold for surgery  . MI (myocardial infarction)     AMI 1/13 - complicated by VT/Tosades  . Bruises easily     d/t being on Effient  . History of kidney stones   . Diabetes mellitus without complication     takes Lantus daily  . History of blood transfusion     no abnormal reaction noted    Past Surgical History  Procedure Laterality Date  . Appendectomy    . Diagnostic laparoscopy    . Anal fissurectomy    . Coronary angioplasty      stent placed Dec 12, 2011  . Aorta - bilateral femoral artery bypass graft N/A 01/16/2013    Procedure: AORTA BIFEMORAL BYPASS GRAFT;  Surgeon: Nada Libman, MD;  Location: [facility] OR;  Service: Vascular;  Laterality: N/A;  . Colonoscopy    . Carotid-subclavian bypass graft Left 04/03/2013    Procedure: BYPASS GRAFT  CAROTID-SUBCLAVIAN;  Surgeon: Nada Libman, MD;  Location: [facility];  Service: Vascular;  Laterality: Left;    Family History  Problem Relation Age of Onset  . Cancer Father     Lung  . Cancer Maternal Uncle     Colon  . Diabetes Mother   . COPD Mother     History  Substance Use Topics  . Smoking status: Current Every Day Smoker -- 0.25 packs/day for 25 years    Types: Cigarettes  . Smokeless tobacco: Never Used  . Alcohol Use: No      Review of Systems  Constitutional: Negative for appetite change and fatigue.  HENT: Positive for neck pain. Negative for congestion, sinus pressure and ear discharge.   Eyes: Negative for discharge.  Respiratory: Negative for cough.   Cardiovascular: Negative for chest pain.  Gastrointestinal: Negative for abdominal pain and diarrhea.  Genitourinary: Negative for frequency and hematuria.  Musculoskeletal: Negative for back pain.  Skin: Negative for rash.  Neurological: Negative for seizures and headaches.  Psychiatric/Behavioral: Negative for hallucinations.    Allergies  Hydrocodone  Home Medications   Current Outpatient Rx  Name  Route  Sig  Dispense  Refill  . aspirin 81 MG tablet   Oral   Take 162 mg by mouth daily.          Marland Kitchen  carvedilol (COREG) 6.25 MG tablet   Oral   Take 1 tablet (6.25 mg total) by mouth 2 (two) times daily with a meal.   60 tablet   6   . cephALEXin (KEFLEX) 500 MG capsule   Oral   Take 500 mg by mouth 4 (four) times daily. Started on 04/05/13         . enalapril (VASOTEC) 5 MG tablet   Oral   Take 1 tablet (5 mg total) by mouth 2 (two) times daily.   60 tablet   6   . insulin glargine (LANTUS) 100 UNIT/ML injection   Subcutaneous   Inject 20 Units into the skin at bedtime.         . nitroGLYCERIN (NITROSTAT) 0.4 MG SL tablet   Sublingual   Place 0.4 mg under the tongue every 5 (five) minutes as needed for chest pain.         Marland Kitchen oxyCODONE-acetaminophen (PERCOCET/ROXICET) 5-325 MG per  tablet   Oral   Take 1-2 tablets by mouth every 6 (six) hours as needed for pain.         . prasugrel (EFFIENT) 10 MG TABS   Oral   Take 1 tablet (10 mg total) by mouth daily.   30 tablet   6   . simvastatin (ZOCOR) 40 MG tablet   Oral   Take 1 tablet (40 mg total) by mouth every evening.   30 tablet   5   . spironolactone (ALDACTONE) 25 MG tablet   Oral   Take 0.5 tablets (12.5 mg total) by mouth daily.   30 tablet   6   . glucose blood test strip      Use to check blood sugar three time a day (fasting, at lunch time and a bedtime; before injecting lantus)   100 each   12   . glucose monitoring kit (FREESTYLE) monitoring kit   Does not apply   1 each by Does not apply route as needed for other.   1 each   0   . Insulin Pen Needle (1ST CHOICE PEN NEEDLES) 31G X 8 MM MISC      Use to inject lantus at bedtime as instructed   100 each   1   . Lancets (FREESTYLE) lancets      Use as instructed   100 each   12     BP 125/80  Pulse 84  Temp(Src) 98 F (36.7 C) (Oral)  Resp 20  SpO2 97%  Physical Exam  Constitutional: He is oriented to person, place, and time. He appears well-developed.  HENT:  Head: Normocephalic.  Eyes: Conjunctivae and EOM are normal. No scleral icterus.  Neck: Neck supple. No thyromegaly present.  Swelling left side of neck with tenderness  Cardiovascular: Normal rate and regular rhythm.  Exam reveals no gallop and no friction rub.   No murmur heard. Pulmonary/Chest: No stridor. He has no wheezes. He has no rales. He exhibits no tenderness.  Abdominal: He exhibits no distension. There is no tenderness. There is no rebound.  Musculoskeletal: Normal range of motion. He exhibits no edema.  Lymphadenopathy:    He has no cervical adenopathy.  Neurological: He is oriented to person, place, and time. Coordination normal.  Skin: No rash noted. No erythema.  Psychiatric: He has a normal mood and affect. His behavior is normal.    ED  Course  Procedures (including critical care time)  Labs Reviewed - No data to display No results  found.   1. Neck pain       MDM  Pt seen by surgeon today.  Plan to put pt on cipro and follow up monday        Benny Lennert, MD 04/11/13 [phone]

## 2013-04-11 NOTE — Telephone Encounter (Signed)
Phone call from pt. To report that left upper chest incision has stopped draining, and has a "tight, swollen, warm, and red appearance."   States the drainage that was continuous stopped about Wednesday PM. (04/09/13)  Denies fever or chills.  States that the area is tender.  Discussed w/ Dr. Imogene Burn.  Advised to go to the ER, since there are no appts. after 12:00 PM today.  Phone call back to pt.  Left voice message to go to the ER for eval. Of incision.

## 2013-04-11 NOTE — ED Notes (Signed)
Swelling in neck since having surgery last week for valve in neck

## 2013-04-12 ENCOUNTER — Telehealth: Payer: Self-pay | Admitting: Vascular Surgery

## 2013-04-12 NOTE — Telephone Encounter (Signed)
Phone call was placed by the patient's pharmacy in regards to multiple recent prescriptions for oxycodone.  I felt that the medication was indicated for his post-surgical pain.  Based on the documentation per the pharmacists, this patient may potential have a chronic narcotic dependence.

## 2013-04-14 ENCOUNTER — Other Ambulatory Visit: Payer: Self-pay

## 2013-04-14 ENCOUNTER — Encounter: Payer: Self-pay | Admitting: Surgery

## 2013-04-14 ENCOUNTER — Ambulatory Visit (INDEPENDENT_AMBULATORY_CARE_PROVIDER_SITE_OTHER): Payer: Medicaid Other | Admitting: Surgery

## 2013-04-14 VITALS — BP 98/61 | HR 92 | Temp 97.6°F | Ht 68.9 in | Wt 199.0 lb

## 2013-04-14 DIAGNOSIS — I739 Peripheral vascular disease, unspecified: Secondary | ICD-10-CM

## 2013-04-14 NOTE — Progress Notes (Signed)
[facility] [place] HISTORY AND PHYSICAL   CC:  Swelling in neck at surgery site  [name], [name], MD  HPI: This is a 46 y.o. male  Who is s/p left carotid to subclavian bypass with 8 mm dacron graft on 04/03/13.  He presented to the emergency department on 04/11/13 with c/o swelling at his surgery site.  He stated that the incision had been draining yellow, clear fluid.  When this stopped draining, the incision site started swelling.  He states it feels like a lot of pressure and he does c/o some pain.  His pre operative symptoms continue to be resolved.  He was given a Rx for Cipro 500 mg bid x 10 days.  He is following up today with Dr. Brabham.  Past Medical History  Diagnosis Date  . Anal fissure   . Rectal polyp   . Mixed hyperlipidemia     takes Simvastatin daily  . PAD (peripheral artery disease)   . GERD (gastroesophageal reflux disease)     "takes tums"  . Hypertension     takes Enalapril and Coreg daily   . Coronary atherosclerosis of native coronary artery     takes Effient but on hold for surgery  . MI (myocardial infarction)     AMI 1/13 - complicated by VT/Tosades  . Bruises easily     d/t being on Effient  . History of kidney stones   . Diabetes mellitus without complication     takes Lantus daily  . History of blood transfusion     no abnormal reaction noted   Past Surgical History  Procedure Laterality Date  . Appendectomy    . Diagnostic laparoscopy    . Anal fissurectomy    . Coronary angioplasty      stent placed Dec 12, 2011  . Aorta - bilateral femoral artery bypass graft N/A 01/16/2013    Procedure: AORTA BIFEMORAL BYPASS GRAFT;  Surgeon: Ryan [name] Brabham, MD;  Location: [facility] OR;  Service: Vascular;  Laterality: N/A;  . Colonoscopy    . Carotid-subclavian bypass graft Left 04/03/2013    Procedure: BYPASS GRAFT CAROTID-SUBCLAVIAN;  Surgeon: Ryan [name] Brabham, MD;  Location: [facility] OR;  Service: Vascular;  Laterality: Left;    Allergies   Allergen Reactions  . Hydrocodone Nausea And Vomiting    Current Outpatient Prescriptions  Medication Sig Dispense Refill  . aspirin 81 MG tablet Take 162 mg by mouth daily.       . carvedilol (COREG) 6.25 MG tablet Take 1 tablet (6.25 mg total) by mouth 2 (two) times daily with a meal.  60 tablet  6  . cephALEXin (KEFLEX) 500 MG capsule Take 500 mg by mouth 4 (four) times daily. Started on 04/05/13      . enalapril (VASOTEC) 5 MG tablet Take 1 tablet (5 mg total) by mouth 2 (two) times daily.  60 tablet  6  . glucose blood test strip Use to check blood sugar three time a day (fasting, at lunch time and a bedtime; before injecting lantus)  100 each  12  . glucose monitoring kit (FREESTYLE) monitoring kit 1 each by Does not apply route as needed for other.  1 each  0  . insulin glargine (LANTUS) 100 UNIT/ML injection Inject 20 Units into the skin at bedtime.      . Insulin Pen Needle (1ST CHOICE PEN NEEDLES) 31G X 8 MM MISC Use to inject lantus at bedtime as instructed  100 each    1  . Lancets (FREESTYLE) lancets Use as instructed  100 each  12  . nitroGLYCERIN (NITROSTAT) 0.4 MG SL tablet Place 0.4 mg under the tongue every 5 (five) minutes as needed for chest pain.      . oxyCODONE-acetaminophen (PERCOCET/ROXICET) 5-325 MG per tablet Take 1-2 tablets by mouth every 6 (six) hours as needed for pain.      . prasugrel (EFFIENT) 10 MG TABS Take 1 tablet (10 mg total) by mouth daily.  30 tablet  6  . simvastatin (ZOCOR) 40 MG tablet Take 1 tablet (40 mg total) by mouth every evening.  30 tablet  5  . spironolactone (ALDACTONE) 25 MG tablet Take 0.5 tablets (12.5 mg total) by mouth daily.  30 tablet  6   No current facility-administered medications for this visit.    Family History  Problem Relation Age of Onset  . Cancer Father     Lung  . Cancer Maternal Uncle     Colon  . Diabetes Mother   . COPD Mother     History   Social History  . Marital Status: Divorced    Spouse Name: N/A     Number of Children: N/A  . Years of Education: N/A   Occupational History  .  Other   Social History Main Topics  . Smoking status: Current Every Day Smoker -- 0.50 packs/day for 25 years    Types: Cigarettes  . Smokeless tobacco: Never Used  . Alcohol Use: No  . Drug Use: No  . Sexually Active: Yes   Other Topics Concern  . Not on file   Social History Narrative   [employer] - physical labor  - works night shift           ROS: [x] Positive   [ ] Negative   [ ] All sytems reviewed and are negative  General: [ ] Weight loss, [ ] Fever, [ ] chills Neurologic: [ ] Dizziness, [ ] Blackouts, [ ] Seizure [ ] Stroke, [ ] "Mini stroke", [ ] Slurred speech, [ ] Temporary blindness; [ ] weakness in arms or legs, [ ] Hoarseness Cardiac: [ ] Chest pain/pressure, [ ] Shortness of breath at rest [ ] Shortness of breath with exertion, [ ] Atrial fibrillation or irregular heartbeat Vascular: [ ] Pain in legs with walking, [ ] Pain in legs at rest, [ ] Pain in legs at night, [x] swelling at surgical site  [ ] Non-healing ulcer, [ ] Blood clot in vein/DVT,   Pulmonary: [ ] Home oxygen, [ ] Productive cough, [ ] Coughing up blood, [ ] Asthma,  [ ] Wheezing Musculoskeletal:  [ ] Arthritis, [ ] Low back pain, [ ] Joint pain Hematologic: [ ] Easy Bruising, [ ] Anemia; [ ] Hepatitis Gastrointestinal: [ ] Blood in stool, [ ] Gastroesophageal Reflux/heartburn, [ ] Trouble swallowing Urinary: [ ] chronic Kidney disease, [ ] on HD - [ ] MWF or [ ] TTHS, [ ] Burning with urination, [ ] Difficulty urinating Endocrine: [ ] hx of diabetes, [ ] hx of thyroid disease Skin: [ ] Rashes, [ ] Wounds Psychological: [ ] Anxiety, [ ] Depression    PHYSICAL EXAMINATION:  Filed Vitals:   04/14/13 1546  BP: 98/61  Pulse: 92  Temp: 97.6 F (36.4 C)   Body mass index is 29.47 kg/(m^2).  General:  WDWN in NAD Gait: Normal HENT: WNL Eyes: PERRL Pulmonary: normal non-labored breathing ,  without   Rales, rhonchi,  wheezing Cardiac: RRR, without  Murmurs, rubs or gallops; No carotid bruits Abdomen: soft, NT, no masses Skin: no rashes, ulcers noted Vascular Exam/Pulses: erythema and swelling around incision; this is somewhat fluctuant.  Extremities: without ischemic changes, no Gangrene , no cellulitis; no open wounds;  Musculoskeletal: no muscle wasting or atrophy  Neurologic: A&O X 3; Appropriate Affect ; SENSATION: normal; MOTOR FUNCTION:  moving all extremities equally. Speech is fluent/normal   Non-Invasive Vascular Imaging:  none  CBC    Component Value Date/Time   WBC 19.9* 04/04/2013 0358   RBC 4.36 04/04/2013 0358   HGB 13.0 04/04/2013 0358   HCT 37.3* 04/04/2013 0358   PLT 210 04/04/2013 0358   MCV 85.6 04/04/2013 0358   MCH 29.8 04/04/2013 0358   MCHC 34.9 04/04/2013 0358   RDW 14.3 04/04/2013 0358   LYMPHSABS 4.5* 08/29/2012 1447   MONOABS 1.0 08/29/2012 1447   EOSABS 0.3 08/29/2012 1447   BASOSABS 0.2* 08/29/2012 1447    BMET    Component Value Date/Time   NA 134* 04/04/2013 0358   K 3.4* 04/04/2013 0358   CL 98 04/04/2013 0358   CO2 26 04/04/2013 0358   GLUCOSE 128* 04/04/2013 0358   BUN 8 04/04/2013 0358   CREATININE 0.90 04/04/2013 0358   CALCIUM 8.6 04/04/2013 0358   GFRNONAA >90 04/04/2013 0358   GFRAA >90 04/04/2013 0358     ASSESSMENT/PLAN: 46 y.o. male s/p left carotid to subclavian bypass 04/03/13 who presents with swelling at surgical site.  -will plan to take pt to the OR tomorrow, 04/15/13 to I&D wound and wash out.  Pt understands and is in agreement.   Samantha Rhyne, PA-C [facility] [phone]  Clinic MD:  Pt seen and examined in conjunction with Dr. Brabham  I agree with the above. The patient has been seen and examined. He no longer has drainage from the left supraclavicular incision, however he does have a fluctuant area with erythema. I suspect this is a lymphocele. I have recommended going ahead and getting this drained surgically. The  patient is in agreement with this. I will schedule this to be done tomorrow.  Ryan [name] 

## 2013-04-17 ENCOUNTER — Encounter (HOSPITAL_COMMUNITY): Payer: Self-pay | Admitting: *Deleted

## 2013-04-17 MED ORDER — DEXTROSE 5 % IV SOLN
1.5000 g | INTRAVENOUS | Status: AC
  Administered 2013-04-18: 1.5 g via INTRAVENOUS
  Filled 2013-04-17: qty 1.5

## 2013-04-17 NOTE — Progress Notes (Signed)
Pt denies SOB and chest pain; pt states that his cardiologist is Dr. [name] at [place].

## 2013-04-18 ENCOUNTER — Encounter (HOSPITAL_COMMUNITY): Payer: Self-pay | Admitting: *Deleted

## 2013-04-18 ENCOUNTER — Inpatient Hospital Stay (HOSPITAL_COMMUNITY)
Admission: RE | Admit: 2013-04-18 | Discharge: 2013-04-20 | DRG: 858 | Disposition: A | Discharge status: Home / Self Care | Payer: Medicaid Other | Source: Ambulatory Visit | Attending: Surgery | Admitting: Surgery

## 2013-04-18 ENCOUNTER — Encounter (HOSPITAL_COMMUNITY)
Admission: RE | Disposition: A | Discharge status: Home / Self Care | Payer: Self-pay | Source: Ambulatory Visit | Attending: Surgery

## 2013-04-18 ENCOUNTER — Ambulatory Visit (HOSPITAL_COMMUNITY): Payer: Medicaid Other | Admitting: Anesthesiology

## 2013-04-18 ENCOUNTER — Encounter (HOSPITAL_COMMUNITY): Payer: Self-pay | Admitting: Anesthesiology

## 2013-04-18 DIAGNOSIS — E782 Mixed hyperlipidemia: Secondary | ICD-10-CM | POA: Diagnosis present

## 2013-04-18 DIAGNOSIS — I10 Essential (primary) hypertension: Secondary | ICD-10-CM | POA: Diagnosis present

## 2013-04-18 DIAGNOSIS — K219 Gastro-esophageal reflux disease without esophagitis: Secondary | ICD-10-CM | POA: Diagnosis present

## 2013-04-18 DIAGNOSIS — Z794 Long term (current) use of insulin: Secondary | ICD-10-CM

## 2013-04-18 DIAGNOSIS — Z7982 Long term (current) use of aspirin: Secondary | ICD-10-CM

## 2013-04-18 DIAGNOSIS — F17200 Nicotine dependence, unspecified, uncomplicated: Secondary | ICD-10-CM | POA: Diagnosis present

## 2013-04-18 DIAGNOSIS — I739 Peripheral vascular disease, unspecified: Secondary | ICD-10-CM | POA: Diagnosis present

## 2013-04-18 DIAGNOSIS — I2510 Atherosclerotic heart disease of native coronary artery without angina pectoris: Secondary | ICD-10-CM | POA: Diagnosis present

## 2013-04-18 DIAGNOSIS — I898 Other specified noninfective disorders of lymphatic vessels and lymph nodes: Secondary | ICD-10-CM

## 2013-04-18 DIAGNOSIS — E119 Type 2 diabetes mellitus without complications: Secondary | ICD-10-CM | POA: Diagnosis present

## 2013-04-18 DIAGNOSIS — Y838 Other surgical procedures as the cause of abnormal reaction of the patient, or of later complication, without mention of misadventure at the time of the procedure: Secondary | ICD-10-CM | POA: Diagnosis present

## 2013-04-18 DIAGNOSIS — I252 Old myocardial infarction: Secondary | ICD-10-CM

## 2013-04-18 DIAGNOSIS — T8140XA Infection following a procedure, unspecified, initial encounter: Principal | ICD-10-CM | POA: Diagnosis present

## 2013-04-18 HISTORY — PX: I & D EXTREMITY: SHX5045

## 2013-04-18 LAB — POCT I-STAT 4, (NA,K, GLUC, HGB,HCT)
Glucose, Bld: 153 mg/dL — ABNORMAL HIGH (ref 70–99)
HCT: 44 % (ref 39.0–52.0)
Potassium: 4.1 mEq/L (ref 3.5–5.1)
Sodium: 138 mEq/L (ref 135–145)

## 2013-04-18 LAB — GLUCOSE, CAPILLARY: Glucose-Capillary: 107 mg/dL — ABNORMAL HIGH (ref 70–99)

## 2013-04-18 LAB — PROTIME-INR: INR: 0.93 (ref 0.00–1.49)

## 2013-04-18 LAB — HEMOGLOBIN A1C
Hgb A1c MFr Bld: 6.7 % — ABNORMAL HIGH (ref <5.7)
Mean Plasma Glucose: 146 mg/dL — ABNORMAL HIGH (ref <117)

## 2013-04-18 SURGERY — IRRIGATION AND DEBRIDEMENT EXTREMITY
Anesthesia: Choice | Site: Neck | Laterality: Left | Wound class: Clean

## 2013-04-18 MED ORDER — HYDROMORPHONE HCL PF 1 MG/ML IJ SOLN
INTRAMUSCULAR | Status: AC
  Filled 2013-04-18: qty 1

## 2013-04-18 MED ORDER — OXYCODONE HCL 5 MG PO TABS: 5.0000 mg | ORAL_TABLET | Freq: Once | ORAL | Status: DC | PRN

## 2013-04-18 MED ORDER — INSULIN ASPART 100 UNIT/ML ~~LOC~~ SOLN: 0.0000 [IU] | Freq: Every day | SUBCUTANEOUS | Status: DC

## 2013-04-18 MED ORDER — SODIUM CHLORIDE 0.9 % IV SOLN: 500.0000 mL | Freq: Once | INTRAVENOUS | Status: AC | PRN

## 2013-04-18 MED ORDER — OXYCODONE HCL 5 MG/5ML PO SOLN: 5.0000 mg | Freq: Once | ORAL | Status: DC | PRN

## 2013-04-18 MED ORDER — ONDANSETRON HCL 4 MG/2ML IJ SOLN
4.0000 mg | Freq: Four times a day (QID) | INTRAMUSCULAR | Status: DC | PRN
  Administered 2013-04-18: 4 mg via INTRAVENOUS
  Filled 2013-04-18: qty 2

## 2013-04-18 MED ORDER — PHENOL 1.4 % MT LIQD
1.0000 | OROMUCOSAL | Status: DC | PRN
  Filled 2013-04-18: qty 177

## 2013-04-18 MED ORDER — POTASSIUM CHLORIDE CRYS ER 20 MEQ PO TBCR: 20.0000 meq | EXTENDED_RELEASE_TABLET | Freq: Once | ORAL | Status: AC | PRN

## 2013-04-18 MED ORDER — METOPROLOL TARTRATE 1 MG/ML IV SOLN: 2.0000 mg | INTRAVENOUS | Status: DC | PRN

## 2013-04-18 MED ORDER — ASPIRIN 81 MG PO TABS: 162.0000 mg | ORAL_TABLET | Freq: Every day | ORAL | Status: DC

## 2013-04-18 MED ORDER — MIDAZOLAM HCL 5 MG/5ML IJ SOLN
INTRAMUSCULAR | Status: DC | PRN
  Administered 2013-04-18 (×2): 1 mg via INTRAVENOUS

## 2013-04-18 MED ORDER — HYDRALAZINE HCL 20 MG/ML IJ SOLN: 10.0000 mg | INTRAMUSCULAR | Status: DC | PRN

## 2013-04-18 MED ORDER — GLYCOPYRROLATE 0.2 MG/ML IJ SOLN
INTRAMUSCULAR | Status: DC | PRN
  Administered 2013-04-18: 0.4 mg via INTRAVENOUS

## 2013-04-18 MED ORDER — DOCUSATE SODIUM 100 MG PO CAPS
100.0000 mg | ORAL_CAPSULE | Freq: Every day | ORAL | Status: DC
  Administered 2013-04-19: 100 mg via ORAL
  Filled 2013-04-18 (×2): qty 1

## 2013-04-18 MED ORDER — SUCCINYLCHOLINE CHLORIDE 20 MG/ML IJ SOLN
INTRAMUSCULAR | Status: DC | PRN
  Administered 2013-04-18: 120 mg via INTRAVENOUS

## 2013-04-18 MED ORDER — LACTATED RINGERS IV SOLN
INTRAVENOUS | Status: DC | PRN
  Administered 2013-04-18 (×2): via INTRAVENOUS

## 2013-04-18 MED ORDER — CARVEDILOL 6.25 MG PO TABS
6.2500 mg | ORAL_TABLET | Freq: Two times a day (BID) | ORAL | Status: DC
  Administered 2013-04-18 – 2013-04-19 (×3): 6.25 mg via ORAL
  Filled 2013-04-18 (×6): qty 1

## 2013-04-18 MED ORDER — ASPIRIN EC 81 MG PO TBEC
162.0000 mg | DELAYED_RELEASE_TABLET | Freq: Every day | ORAL | Status: DC
  Administered 2013-04-19: 162 mg via ORAL
  Filled 2013-04-18 (×2): qty 2

## 2013-04-18 MED ORDER — PROPOFOL 10 MG/ML IV BOLUS
INTRAVENOUS | Status: DC | PRN
  Administered 2013-04-18: 20 mg via INTRAVENOUS
  Administered 2013-04-18: 160 mg via INTRAVENOUS

## 2013-04-18 MED ORDER — SODIUM CHLORIDE 0.9 % IV SOLN: INTRAVENOUS | Status: DC

## 2013-04-18 MED ORDER — FENTANYL CITRATE 0.05 MG/ML IJ SOLN
INTRAMUSCULAR | Status: DC | PRN
  Administered 2013-04-18: 50 ug via INTRAVENOUS
  Administered 2013-04-18: 100 ug via INTRAVENOUS
  Administered 2013-04-18 (×2): 50 ug via INTRAVENOUS

## 2013-04-18 MED ORDER — PANTOPRAZOLE SODIUM 40 MG PO TBEC
40.0000 mg | DELAYED_RELEASE_TABLET | Freq: Every day | ORAL | Status: DC
  Administered 2013-04-18 – 2013-04-19 (×2): 40 mg via ORAL
  Filled 2013-04-18: qty 1

## 2013-04-18 MED ORDER — 0.9 % SODIUM CHLORIDE (POUR BTL) OPTIME
TOPICAL | Status: DC | PRN
  Administered 2013-04-18: 1000 mL

## 2013-04-18 MED ORDER — INSULIN GLARGINE 100 UNIT/ML ~~LOC~~ SOLN
20.0000 [IU] | Freq: Every day | SUBCUTANEOUS | Status: DC
  Administered 2013-04-18 – 2013-04-19 (×2): 20 [IU] via SUBCUTANEOUS
  Filled 2013-04-18 (×3): qty 0.2

## 2013-04-18 MED ORDER — LIDOCAINE HCL (CARDIAC) 20 MG/ML IV SOLN
INTRAVENOUS | Status: DC | PRN
  Administered 2013-04-18: 80 mg via INTRAVENOUS

## 2013-04-18 MED ORDER — ONDANSETRON HCL 4 MG/2ML IJ SOLN
INTRAMUSCULAR | Status: DC | PRN
  Administered 2013-04-18: 4 mg via INTRAVENOUS

## 2013-04-18 MED ORDER — ONDANSETRON HCL 4 MG/2ML IJ SOLN: 4.0000 mg | Freq: Four times a day (QID) | INTRAMUSCULAR | Status: DC | PRN

## 2013-04-18 MED ORDER — DEXTROSE 5 % IV SOLN
INTRAVENOUS | Status: DC | PRN
  Administered 2013-04-18: 12:00:00 via INTRAVENOUS

## 2013-04-18 MED ORDER — INSULIN ASPART 100 UNIT/ML ~~LOC~~ SOLN
0.0000 [IU] | Freq: Three times a day (TID) | SUBCUTANEOUS | Status: DC
  Administered 2013-04-19 – 2013-04-20 (×2): 2 [IU] via SUBCUTANEOUS

## 2013-04-18 MED ORDER — SIMVASTATIN 40 MG PO TABS
40.0000 mg | ORAL_TABLET | Freq: Every evening | ORAL | Status: DC
  Administered 2013-04-18 – 2013-04-19 (×2): 40 mg via ORAL
  Filled 2013-04-18 (×3): qty 1

## 2013-04-18 MED ORDER — ENALAPRIL MALEATE 5 MG PO TABS
5.0000 mg | ORAL_TABLET | Freq: Two times a day (BID) | ORAL | Status: DC
  Administered 2013-04-18 – 2013-04-19 (×3): 5 mg via ORAL
  Filled 2013-04-18 (×5): qty 1

## 2013-04-18 MED ORDER — HYDROMORPHONE HCL PF 1 MG/ML IJ SOLN
0.2500 mg | INTRAMUSCULAR | Status: DC | PRN
  Administered 2013-04-18 (×4): 0.5 mg via INTRAVENOUS

## 2013-04-18 MED ORDER — GUAIFENESIN-DM 100-10 MG/5ML PO SYRP: 15.0000 mL | ORAL_SOLUTION | ORAL | Status: DC | PRN

## 2013-04-18 MED ORDER — ACETAMINOPHEN 650 MG RE SUPP: 325.0000 mg | RECTAL | Status: DC | PRN

## 2013-04-18 MED ORDER — ARTIFICIAL TEARS OP OINT
TOPICAL_OINTMENT | OPHTHALMIC | Status: DC | PRN
  Administered 2013-04-18: 1 via OPHTHALMIC

## 2013-04-18 MED ORDER — LACTATED RINGERS IV SOLN: INTRAVENOUS | Status: DC

## 2013-04-18 MED ORDER — HEMOSTATIC AGENTS (NO CHARGE) OPTIME
TOPICAL | Status: DC | PRN
  Administered 2013-04-18: 1 via TOPICAL

## 2013-04-18 MED ORDER — NEOSTIGMINE METHYLSULFATE 1 MG/ML IJ SOLN
INTRAMUSCULAR | Status: DC | PRN
Start: 2013-04-18 — End: 2013-04-18
  Administered 2013-04-18: 3 mg via INTRAVENOUS

## 2013-04-18 MED ORDER — ROCURONIUM BROMIDE 100 MG/10ML IV SOLN
INTRAVENOUS | Status: DC | PRN
  Administered 2013-04-18: 20 mg via INTRAVENOUS

## 2013-04-18 MED ORDER — MORPHINE SULFATE 2 MG/ML IJ SOLN: 2.0000 mg | INTRAMUSCULAR | Status: DC | PRN

## 2013-04-18 MED ORDER — ACETAMINOPHEN 325 MG PO TABS: 325.0000 mg | ORAL_TABLET | ORAL | Status: DC | PRN

## 2013-04-18 MED ORDER — OXYCODONE-ACETAMINOPHEN 5-325 MG PO TABS
1.0000 | ORAL_TABLET | Freq: Four times a day (QID) | ORAL | Status: DC | PRN
  Administered 2013-04-18 – 2013-04-19 (×3): 2 via ORAL
  Administered 2013-04-19: 1 via ORAL
  Administered 2013-04-19 – 2013-04-20 (×3): 2 via ORAL
  Filled 2013-04-18 (×7): qty 2

## 2013-04-18 MED ORDER — CEPHALEXIN 500 MG PO CAPS
500.0000 mg | ORAL_CAPSULE | Freq: Four times a day (QID) | ORAL | Status: DC
  Administered 2013-04-18 – 2013-04-19 (×6): 500 mg via ORAL
  Filled 2013-04-18 (×11): qty 1

## 2013-04-18 MED ORDER — HYDROMORPHONE HCL PF 1 MG/ML IJ SOLN
INTRAMUSCULAR | Status: AC
  Filled 2013-04-18: qty 2

## 2013-04-18 MED ORDER — ALUM & MAG HYDROXIDE-SIMETH 200-200-20 MG/5ML PO SUSP
15.0000 mL | ORAL | Status: DC | PRN
  Administered 2013-04-18: 30 mL via ORAL
  Filled 2013-04-18: qty 30

## 2013-04-18 MED ORDER — LABETALOL HCL 5 MG/ML IV SOLN
10.0000 mg | INTRAVENOUS | Status: DC | PRN
  Filled 2013-04-18: qty 4

## 2013-04-18 MED ORDER — NITROGLYCERIN 0.4 MG SL SUBL: 0.4000 mg | SUBLINGUAL_TABLET | SUBLINGUAL | Status: DC | PRN

## 2013-04-18 SURGICAL SUPPLY — 47 items
BANDAGE ELASTIC 4 VELCRO ST LF (GAUZE/BANDAGES/DRESSINGS) IMPLANT
BANDAGE ELASTIC 6 VELCRO ST LF (GAUZE/BANDAGES/DRESSINGS) IMPLANT
BANDAGE GAUZE ELAST BULKY 4 IN (GAUZE/BANDAGES/DRESSINGS) IMPLANT
CANISTER SUCTION 2500CC (MISCELLANEOUS) ×2 IMPLANT
CLIP TI MEDIUM 6 (CLIP) ×2 IMPLANT
CLIP TI WIDE RED SMALL 6 (CLIP) ×2 IMPLANT
CLOTH BEACON ORANGE TIMEOUT ST (SAFETY) ×2 IMPLANT
COVER SURGICAL LIGHT HANDLE (MISCELLANEOUS) ×2 IMPLANT
DERMABOND ADHESIVE PROPEN (GAUZE/BANDAGES/DRESSINGS) ×1
DERMABOND ADVANCED .7 DNX6 (GAUZE/BANDAGES/DRESSINGS) ×1 IMPLANT
DRAIN CHANNEL 15F RND FF W/TCR (WOUND CARE) ×2 IMPLANT
DRAPE EXTREMITY BILATERAL (DRAPE) IMPLANT
DRAPE EXTREMITY T 121X128X90 (DRAPE) IMPLANT
DRAPE U-SHAPE 76X120 STRL (DRAPES) IMPLANT
ELECT REM PT RETURN 9FT ADLT (ELECTROSURGICAL) ×2
ELECTRODE REM PT RTRN 9FT ADLT (ELECTROSURGICAL) ×1 IMPLANT
EVACUATOR SILICONE 100CC (DRAIN) ×2 IMPLANT
GAUZE XEROFORM 5X9 LF (GAUZE/BANDAGES/DRESSINGS) IMPLANT
GLOVE BIO SURGEON STRL SZ 6.5 (GLOVE) ×2 IMPLANT
GLOVE BIOGEL PI IND STRL 6.5 (GLOVE) ×5 IMPLANT
GLOVE BIOGEL PI IND STRL 7.0 (GLOVE) ×1 IMPLANT
GLOVE BIOGEL PI IND STRL 7.5 (GLOVE) ×1 IMPLANT
GLOVE BIOGEL PI INDICATOR 6.5 (GLOVE) ×5
GLOVE BIOGEL PI INDICATOR 7.0 (GLOVE) ×1
GLOVE BIOGEL PI INDICATOR 7.5 (GLOVE) ×1
GLOVE SURG SS PI 7.5 STRL IVOR (GLOVE) ×2 IMPLANT
GOWN PREVENTION PLUS XXLARGE (GOWN DISPOSABLE) ×2 IMPLANT
GOWN STRL NON-REIN LRG LVL3 (GOWN DISPOSABLE) ×4 IMPLANT
HEMOSTAT SNOW SURGICEL 2X4 (HEMOSTASIS) ×2 IMPLANT
KIT BASIN OR (CUSTOM PROCEDURE TRAY) ×2 IMPLANT
KIT ROOM TURNOVER OR (KITS) ×2 IMPLANT
NS IRRIG 1000ML POUR BTL (IV SOLUTION) ×2 IMPLANT
PACK CAROTID (CUSTOM PROCEDURE TRAY) ×2 IMPLANT
PACK CV ACCESS (CUSTOM PROCEDURE TRAY) IMPLANT
PACK GENERAL/GYN (CUSTOM PROCEDURE TRAY) IMPLANT
PACK UNIVERSAL I (CUSTOM PROCEDURE TRAY) IMPLANT
PAD ARMBOARD 7.5X6 YLW CONV (MISCELLANEOUS) ×4 IMPLANT
SPONGE GAUZE 4X4 12PLY (GAUZE/BANDAGES/DRESSINGS) ×2 IMPLANT
STAPLER VISISTAT 35W (STAPLE) IMPLANT
SUT ETHILON 3 0 PS 1 (SUTURE) ×2 IMPLANT
SUT VIC AB 2-0 CTX 36 (SUTURE) IMPLANT
SUT VIC AB 3-0 SH 27 (SUTURE) ×2
SUT VIC AB 3-0 SH 27X BRD (SUTURE) ×2 IMPLANT
SUT VICRYL 4-0 PS2 18IN ABS (SUTURE) ×2 IMPLANT
TOWEL OR 17X24 6PK STRL BLUE (TOWEL DISPOSABLE) ×2 IMPLANT
TOWEL OR 17X26 10 PK STRL BLUE (TOWEL DISPOSABLE) ×2 IMPLANT
WATER STERILE IRR 1000ML POUR (IV SOLUTION) ×2 IMPLANT

## 2013-04-18 NOTE — Anesthesia Postprocedure Evaluation (Signed)
Anesthesia Post Note  Patient: Ryan [name]  Procedure(s) Performed: Procedure(s) (LRB): debridement of left neck lymphocele (Left)  Anesthesia type: General  Patient location: PACU  Post pain: Pain level controlled and Adequate analgesia  Post assessment: Post-op Vital signs reviewed, Patient's Cardiovascular Status Stable, Respiratory Function Stable, Patent Airway and Pain level controlled  Last Vitals:  Filed Vitals:   04/18/13 1330  BP:   Pulse: 65  Temp:   Resp: 13    Post vital signs: Reviewed and stable  Level of consciousness: awake, alert  and oriented  Complications: No apparent anesthesia complications

## 2013-04-18 NOTE — Preoperative (Signed)
Beta Blockers   Reason not to administer Beta Blockers:Coreg 0700 today

## 2013-04-18 NOTE — Interval H&P Note (Signed)
History and Physical Interval Note:  04/18/2013 11:11 AM  Ryan [name]  has presented today for surgery, with the diagnosis of left neck wound infection  The various methods of treatment have been discussed with the patient and family. After consideration of risks, benefits and other options for treatment, the patient has consented to  Procedure(s) with comments: IRRIGATION AND DEBRIDEMENT EXTREMITY (Left) - I and D of left neck as a surgical intervention .  The patient's history has been reviewed, patient examined, no change in status, stable for surgery.  I have reviewed the patient's chart and labs.  Questions were answered to the patient's satisfaction.     BRABHAM IV, V. WELLS

## 2013-04-18 NOTE — Op Note (Signed)
[facility] [place]  Patient name: Ryan [name] MRN: [id number] DOB: Feb 16, 1966 Sex: male  04/18/2013 Pre-operative Diagnosis: Left neck swelling  Post-operative diagnosis:  Same Surgeon:  Jorge Ny Assistants:  Della Goo Procedure:   Redo left supraclavicular incision    #2: I&D left neck lymphocele   Anesthesia:  General  Blood Loss:  See anesthesia record Specimens:  Cultures were obtained both aerobic and anaerobic   Findings:  Carotid subclavian bypass graft remains widely patent by Doppler ultrasound. The predominant amount of fluid was in the superior lateral portion of the incision. No obvious source was identified. A 15 [place] Blake drain was placed. There was no evidence of infection.   Indications:  The patient recently underwent left carotid subclavian bypass graft. He has done well from the operation standpoint, however he developed swelling in his supraclavicular incision as well as erythema. He had some drainage. Ultimately the drainage has stopped but the swelling persisted. He has had decreased of erythema with antibiotics. I'm concerned about it lymphatic leak and therefore have recommended reexploration as he has failed nonoperative treatment.   Procedure:  The patient was identified in the holding area and taken to [facility] OR ROOM 12  The patient was then placed supine on the table. general anesthesia was administered.  The patient was prepped and draped in the usual sterile fashion.  A time out was called and antibiotics were administered.  The patient's previous left supraclavicular incision was opened with a 10 blade. The platysma muscle was divided with Metzenbaum scissors. Anaerobic and aerobic cultures were sent. The predominance of the fluid was in the superior lateral portion of the incision. This was extensively irrigated. No obvious source of drainage was identified. Doppler was used to confirm that the carotid subclavian bypass graft  was patent. There was a Doppler signal. I observed the wound for approximately 15 minutes and did not see any obvious source of leak. I placed multiple sutures in the superior left side of the wound where the dominance of the fluid had collected. I elected to place a 15 Blake drain which was brought out through a separate stab incision. The drain was sewn into place with a 3-0 nylon. The wound is irrigated again. The platysma muscles closed with 3-0 Vicryl and the skin was closed with 4-0 Vicryl. Dermabond was applied. There were no complications.   Disposition:  To PACU in stable condition.   Juleen China, M.D. [facility] [place] Office: [phone] Pager:  [phone]

## 2013-04-18 NOTE — Transfer of Care (Signed)
Immediate Anesthesia Transfer of Care Note  Patient: Ryan [name]  Procedure(s) Performed: Procedure(s) with comments: debridement of left neck lymphocele (Left) - I and D of left neck  Patient Location: PACU  Anesthesia Type:General  Level of Consciousness: awake, alert , oriented and patient cooperative  Airway & Oxygen Therapy: Patient Spontanous Breathing and Patient connected to nasal cannula oxygen  Post-op Assessment: Report given to PACU RN, Post -op Vital signs reviewed and stable and Patient moving all extremities  Post vital signs: Reviewed and stable  Complications: No apparent anesthesia complications

## 2013-04-18 NOTE — H&P (View-Only) (Signed)
[facility] [place] HISTORY AND PHYSICAL   CC:  Swelling in neck at surgery site  Nada Libman, MD  HPI: This is a 47 y.o. male  Who is s/p left carotid to subclavian bypass with 8 mm dacron graft on 04/03/13.  He presented to the emergency department on 04/11/13 with c/o swelling at his surgery site.  He stated that the incision had been draining yellow, clear fluid.  When this stopped draining, the incision site started swelling.  He states it feels like a lot of pressure and he does c/o some pain.  His pre operative symptoms continue to be resolved.  He was given a Rx for Cipro 500 mg bid x 10 days.  He is following up today with Dr. Myra Gianotti.  Past Medical History  Diagnosis Date  . Anal fissure   . Rectal polyp   . Mixed hyperlipidemia     takes Simvastatin daily  . PAD (peripheral artery disease)   . GERD (gastroesophageal reflux disease)     "takes tums"  . Hypertension     takes Enalapril and Coreg daily   . Coronary atherosclerosis of native coronary artery     takes Effient but on hold for surgery  . MI (myocardial infarction)     AMI 1/13 - complicated by VT/Tosades  . Bruises easily     d/t being on Effient  . History of kidney stones   . Diabetes mellitus without complication     takes Lantus daily  . History of blood transfusion     no abnormal reaction noted   Past Surgical History  Procedure Laterality Date  . Appendectomy    . Diagnostic laparoscopy    . Anal fissurectomy    . Coronary angioplasty      stent placed Dec 12, 2011  . Aorta - bilateral femoral artery bypass graft N/A 01/16/2013    Procedure: AORTA BIFEMORAL BYPASS GRAFT;  Surgeon: Nada Libman, MD;  Location: [facility] OR;  Service: Vascular;  Laterality: N/A;  . Colonoscopy    . Carotid-subclavian bypass graft Left 04/03/2013    Procedure: BYPASS GRAFT CAROTID-SUBCLAVIAN;  Surgeon: Nada Libman, MD;  [facility];  Service: Vascular;  Laterality: Left;    Allergies   Allergen Reactions  . Hydrocodone Nausea And Vomiting    Current Outpatient Prescriptions  Medication Sig Dispense Refill  . aspirin 81 MG tablet Take 162 mg by mouth daily.       . carvedilol (COREG) 6.25 MG tablet Take 1 tablet (6.25 mg total) by mouth 2 (two) times daily with a meal.  60 tablet  6  . cephALEXin (KEFLEX) 500 MG capsule Take 500 mg by mouth 4 (four) times daily. Started on 04/05/13      . enalapril (VASOTEC) 5 MG tablet Take 1 tablet (5 mg total) by mouth 2 (two) times daily.  60 tablet  6  . glucose blood test strip Use to check blood sugar three time a day (fasting, at lunch time and a bedtime; before injecting lantus)  100 each  12  . glucose monitoring kit (FREESTYLE) monitoring kit 1 each by Does not apply route as needed for other.  1 each  0  . insulin glargine (LANTUS) 100 UNIT/ML injection Inject 20 Units into the skin at bedtime.      . Insulin Pen Needle (1ST CHOICE PEN NEEDLES) 31G X 8 MM MISC Use to inject lantus at bedtime as instructed  100 each  1  . Lancets (FREESTYLE) lancets Use as instructed  100 each  12  . nitroGLYCERIN (NITROSTAT) 0.4 MG SL tablet Place 0.4 mg under the tongue every 5 (five) minutes as needed for chest pain.      Marland Kitchen oxyCODONE-acetaminophen (PERCOCET/ROXICET) 5-325 MG per tablet Take 1-2 tablets by mouth every 6 (six) hours as needed for pain.      . prasugrel (EFFIENT) 10 MG TABS Take 1 tablet (10 mg total) by mouth daily.  30 tablet  6  . simvastatin (ZOCOR) 40 MG tablet Take 1 tablet (40 mg total) by mouth every evening.  30 tablet  5  . spironolactone (ALDACTONE) 25 MG tablet Take 0.5 tablets (12.5 mg total) by mouth daily.  30 tablet  6   No current facility-administered medications for this visit.    Family History  Problem Relation Age of Onset  . Cancer Father     Lung  . Cancer Maternal Uncle     Colon  . Diabetes Mother   . COPD Mother     History   Social History  . Marital Status: Divorced    Spouse Name: N/A     Number of Children: N/A  . Years of Education: N/A   Occupational History  .  Other   Social History Main Topics  . Smoking status: Current Every Day Smoker -- 0.50 packs/day for 25 years    Types: Cigarettes  . Smokeless tobacco: Never Used  . Alcohol Use: No  . Drug Use: No  . Sexually Active: Yes   Other Topics Concern  . Not on file   Social History Narrative   [employer] - physical labor  - works night shift           ROS: [x]  Positive   [ ]  Negative   [ ]  All sytems reviewed and are negative  General: [ ]  Weight loss, [ ]  Fever, [ ]  chills Neurologic: [ ]  Dizziness, [ ]  Blackouts, [ ]  Seizure [ ]  Stroke, [ ]  "Mini stroke", [ ]  Slurred speech, [ ]  Temporary blindness; [ ]  weakness in arms or legs, [ ]  Hoarseness Cardiac: [ ]  Chest pain/pressure, [ ]  Shortness of breath at rest [ ]  Shortness of breath with exertion, [ ]  Atrial fibrillation or irregular heartbeat Vascular: [ ]  Pain in legs with walking, [ ]  Pain in legs at rest, [ ]  Pain in legs at night, [x]  swelling at surgical site  [ ]  Non-healing ulcer, [ ]  Blood clot in vein/DVT,   Pulmonary: [ ]  Home oxygen, [ ]  Productive cough, [ ]  Coughing up blood, [ ]  Asthma,  [ ]  Wheezing Musculoskeletal:  [ ]  Arthritis, [ ]  Low back pain, [ ]  Joint pain Hematologic: [ ]  Easy Bruising, [ ]  Anemia; [ ]  Hepatitis Gastrointestinal: [ ]  Blood in stool, [ ]  Gastroesophageal Reflux/heartburn, [ ]  Trouble swallowing Urinary: [ ]  chronic Kidney disease, [ ]  on HD - [ ]  MWF or [ ]  TTHS, [ ]  Burning with urination, [ ]  Difficulty urinating Endocrine: [ ]  hx of diabetes, [ ]  hx of thyroid disease Skin: [ ]  Rashes, [ ]  Wounds Psychological: [ ]  Anxiety, [ ]  Depression    PHYSICAL EXAMINATION:  Filed Vitals:   04/14/13 1546  BP: 98/61  Pulse: 92  Temp: 97.6 F (36.4 C)   Body mass index is 29.47 kg/(m^2).  General:  WDWN in NAD Gait: Normal HENT: WNL Eyes: PERRL Pulmonary: normal non-labored breathing ,  without  Rales, rhonchi,  wheezing Cardiac: RRR, without  Murmurs, rubs or gallops; No carotid bruits Abdomen: soft, NT, no masses Skin: no rashes, ulcers noted Vascular Exam/Pulses: erythema and swelling around incision; this is somewhat fluctuant.  Extremities: without ischemic changes, no Gangrene , no cellulitis; no open wounds;  Musculoskeletal: no muscle wasting or atrophy  Neurologic: A&O X 3; Appropriate Affect ; SENSATION: normal; MOTOR FUNCTION:  moving all extremities equally. Speech is fluent/normal   Non-Invasive Vascular Imaging:  none  CBC    Component Value Date/Time   WBC 19.9* 04/04/2013 0358   RBC 4.36 04/04/2013 0358   HGB 13.0 04/04/2013 0358   HCT 37.3* 04/04/2013 0358   PLT 210 04/04/2013 0358   MCV 85.6 04/04/2013 0358   MCH 29.8 04/04/2013 0358   MCHC 34.9 04/04/2013 0358   RDW 14.3 04/04/2013 0358   LYMPHSABS 4.5* 08/29/2012 1447   MONOABS 1.0 08/29/2012 1447   EOSABS 0.3 08/29/2012 1447   BASOSABS 0.2* 08/29/2012 1447    BMET    Component Value Date/Time   NA 134* 04/04/2013 0358   K 3.4* 04/04/2013 0358   CL 98 04/04/2013 0358   CO2 26 04/04/2013 0358   GLUCOSE 128* 04/04/2013 0358   BUN 8 04/04/2013 0358   CREATININE 0.90 04/04/2013 0358   CALCIUM 8.6 04/04/2013 0358   GFRNONAA >90 04/04/2013 0358   GFRAA >90 04/04/2013 0358     ASSESSMENT/PLAN: 47 y.o. male s/p left carotid to subclavian bypass 04/03/13 who presents with swelling at surgical site.  -will plan to take pt to the OR tomorrow, 04/15/13 to I&D wound and wash out.  Pt understands and is in agreement.   Doreatha Massed, PA-C [facility] [phone]  Clinic MD:  Pt seen and examined in conjunction with Dr. Myra Gianotti  I agree with the above. The patient has been seen and examined. He no longer has drainage from the left supraclavicular incision, however he does have a fluctuant area with erythema. I suspect this is a lymphocele. I have recommended going ahead and getting this drained surgically. The  patient is in agreement with this. I will schedule this to be done tomorrow.  Durene Cal

## 2013-04-18 NOTE — Progress Notes (Signed)
We plan to re-start his Effient at discharge.  Whitman Meinhardt MAUREEN PA-C

## 2013-04-18 NOTE — Anesthesia Preprocedure Evaluation (Addendum)
Anesthesia Evaluation  Patient identified by MRN, date of birth, ID band Patient awake    Reviewed: Allergy & Precautions, H&P , NPO status , Patient's Chart, lab work & pertinent test results, reviewed documented beta blocker date and time   Airway Mallampati: II TM Distance: >3 FB Neck ROM: full    Dental  (+) Edentulous Upper, Dental Advisory Given and Teeth Intact   Pulmonary Current Smoker,          Cardiovascular hypertension, Pt. on home beta blockers + CAD, + Past MI, + Cardiac Stents and + Peripheral Vascular Disease  Pt describes MI/ stenting in Jan 2013 and doing well since   Neuro/Psych    GI/Hepatic GERD-  Medicated,Prn med for reflux   Endo/Other  diabetes, Well Controlled, Type 2Diabetes discovered only a few months ago.  Lantus 1/2 dose at bedtime last night  Renal/GU      Musculoskeletal   Abdominal   Peds  Hematology   Anesthesia Other Findings Full beard  Reproductive/Obstetrics                         Anesthesia Physical Anesthesia Plan  ASA: III  Anesthesia Plan: General   Post-op Pain Management:    Induction: Intravenous  Airway Management Planned: LMA  Additional Equipment:   Intra-op Plan:   Post-operative Plan:   Informed Consent: I have reviewed the patients History and Physical, chart, labs and discussed the procedure including the risks, benefits and alternatives for the proposed anesthesia with the patient or authorized representative who has indicated his/her understanding and acceptance.     Plan Discussed with: CRNA, Anesthesiologist and Surgeon  Anesthesia Plan Comments:         Anesthesia Quick Evaluation

## 2013-04-19 LAB — CBC
Hemoglobin: 12.5 g/dL — ABNORMAL LOW (ref 13.0–17.0)
MCH: 29.1 pg (ref 26.0–34.0)
MCHC: 33.2 g/dL (ref 30.0–36.0)
MCV: 87.6 fL (ref 78.0–100.0)

## 2013-04-19 LAB — GLUCOSE, CAPILLARY
Glucose-Capillary: 86 mg/dL (ref 70–99)
Glucose-Capillary: 114 mg/dL — ABNORMAL HIGH (ref 70–99)
Glucose-Capillary: 131 mg/dL — ABNORMAL HIGH (ref 70–99)

## 2013-04-19 LAB — BASIC METABOLIC PANEL
BUN: 13 mg/dL (ref 6–23)
Calcium: 8.8 mg/dL (ref 8.4–10.5)
Creatinine, Ser: 0.97 mg/dL (ref 0.50–1.35)
GFR calc non Af Amer: >90 mL/min (ref >90)
Glucose, Bld: 178 mg/dL — ABNORMAL HIGH (ref 70–99)
Sodium: 138 mEq/L (ref 135–145)

## 2013-04-19 NOTE — Progress Notes (Signed)
Patient stated that he had some indigeston.  Patient was given 30ml maalox po.  Patient stated that it was effective. Will continue to monitor.

## 2013-04-19 NOTE — Progress Notes (Signed)
VASCULAR PROGRESS NOTE  SUBJECTIVE: Incision is a little sore. No other complaints.  PHYSICAL EXAM: Filed Vitals:   04/18/13 1514 04/18/13 1537 04/18/13 1932 04/19/13 0433  BP: 133/75 143/82 125/67 109/67  Pulse: 65 63 61 70  Temp:   97.5 F (36.4 C) 98.3 F (36.8 C)  TempSrc:   Oral Oral  Resp: 18 16 16 18   Height:      Weight:      SpO2: 99% 100% 98% 98%   Left supraclavicular incision looks good. 10 cc from drain in the left supraclavicular incision.  LABS: Lab Results  Component Value Date   WBC 8.7 04/19/2013   HGB 12.5* 04/19/2013   HCT 37.6* 04/19/2013   MCV 87.6 04/19/2013   PLT 383 04/19/2013   Lab Results  Component Value Date   CREATININE 0.97 04/19/2013   Lab Results  Component Value Date   INR 0.93 04/18/2013   CBG (last 3)   Recent Labs  04/18/13 1625 04/18/13 2108 04/19/13 0612  GLUCAP 84 153* 150*   ASSESSMENT AND PLAN: 1. 1 Day Post-Op s/p: I&D of left neck lymphocele. 2. If no increase in drainage from JP drain, discontinued drain in the a.m. And plan for discharge in a.m.  Cari Caraway Beeper: [phone] 04/19/2013

## 2013-04-20 LAB — WOUND CULTURE: Gram Stain: NONE SEEN

## 2013-04-20 MED ORDER — OXYCODONE-ACETAMINOPHEN 5-325 MG PO TABS: 1.0000 | ORAL_TABLET | Freq: Four times a day (QID) | ORAL | Status: DC | PRN

## 2013-04-20 NOTE — Progress Notes (Signed)
Patient has done well with managing his pain and ambulating in the room and in the hall over the past two days. Will continue to monitor.

## 2013-04-20 NOTE — Progress Notes (Addendum)
[facility] [place]  Subjective  - " I'm ready to go home."  No swallowing difficulty.  Objective 113/66 58 98.1 F (36.7 C) (Oral) 20 98%  Intake/Output Summary (Last 24 hours) at 04/20/13 0753 Last data filed at 04/20/13 1610  Gross per 24 hour  Intake    480 ml  Output     10 ml  Net    470 ml   Incison clean and dry.  No drainage.  No erythema, min. edema JP D/C'd patient tolerated well.  10 cc output total last 24 hours Palpable radial pulses bilateral.  Assessment/Planning: POD #2  Redo left supraclavicular incision  #2: I&D left neck lymphocele  Cultures no growth Follow up in 1 week with Dr. Diamond Nickel, EMMA [facility] 04/20/2013 7:53 AM  Agree with above. Culture negative so far. Gram stain: no organisms.  For D/C today.  Waverly Ferrari, MD, FACS Beeper [phone] 04/20/2013

## 2013-04-20 NOTE — Discharge Summary (Signed)
[facility] Discharge Summary   Patient ID:  Ryan [name] MRN: [id number] DOB/AGE: [birth year] 47 y.o.  Admit date: 04/18/2013 Discharge date: 04/20/2013 Date of Surgery: 04/18/2013 Surgeon: Surgeon(s): Nada Libman, MD  Admission Diagnosis: left neck wound infection  Discharge Diagnoses:  left neck wound infection  Secondary Diagnoses: Past Medical History  Diagnosis Date  . Anal fissure   . Rectal polyp   . Mixed hyperlipidemia     takes Simvastatin daily  . PAD (peripheral artery disease)   . GERD (gastroesophageal reflux disease)     "takes tums"  . Hypertension     takes Enalapril and Coreg daily   . Coronary atherosclerosis of native coronary artery     takes Effient but on hold for surgery  . MI (myocardial infarction)     AMI 1/13 - complicated by VT/Tosades  . Bruises easily     d/t being on Effient  . History of kidney stones   . Diabetes mellitus without complication     takes Lantus daily  . History of blood transfusion     no abnormal reaction noted    Procedure(s): debridement of left neck lymphocele  Discharged Condition: good  HPI: This is a 47 y.o. male Who is s/p left carotid to subclavian bypass with 8 mm dacron graft on 04/03/13. He presented to the emergency department on 04/11/13 with c/o swelling at his surgery site. He stated that the incision had been draining yellow, clear fluid. When this stopped draining, the incision site started swelling. He states it feels like a lot of pressure and he does c/o some pain. His pre operative symptoms continue to be resolved. He was given a Rx for Cipro 500 mg bid x 10 days. He is following up today with Dr. Myra Gianotti.  He was taken to the OR 04/18/2013 for I&D left neck lymphocele and redo incision S/P Carotid subclavian bypass graft.  A JP drain was placed in the incision and cultures were sent to micro.  The JP output was 10 cc for 24 hours.  JP D/C'd 04/20/2013.  He will be discharge today and  follow up in 1 week with Dr. Myra Gianotti.     Hospital Course:  Ryan [name] is a 47 y.o. male is S/P Left Procedure(s): debridement of left neck lymphocele Extubated: POD # 0 Physical exam: Incison clean and dry. No drainage. No erythema, min. edema  JP D/C'd patient tolerated well. 10 cc output total last 24 hours  Palpable radial pulses bilateral.  No facial asymmetry.   Post-op wounds clean, dry, intact or healing well with min. Edema. Pt. Ambulating, voiding and taking PO diet without difficulty. Pt pain controlled with PO pain meds. Labs as below Complications:none  Consults:     Significant Diagnostic Studies: CBC Lab Results  Component Value Date   WBC 8.7 04/19/2013   HGB 12.5* 04/19/2013   HCT 37.6* 04/19/2013   MCV 87.6 04/19/2013   PLT 383 04/19/2013    BMET    Component Value Date/Time   NA 138 04/19/2013 0355   K 3.7 04/19/2013 0355   CL 101 04/19/2013 0355   CO2 29 04/19/2013 0355   GLUCOSE 178* 04/19/2013 0355   BUN 13 04/19/2013 0355   CREATININE 0.97 04/19/2013 0355   CALCIUM 8.8 04/19/2013 0355   GFRNONAA >90 04/19/2013 0355   GFRAA >90 04/19/2013 0355   COAG Lab Results  Component Value Date   INR 0.93 04/18/2013   INR 0.91 04/02/2013  INR 1.15 01/16/2013     Disposition:  Discharge to :Home Discharge Orders   Future Orders Complete By Expires     Call MD for:  redness, tenderness, or signs of infection (pain, swelling, bleeding, redness, odor or green/yellow discharge around incision site)  As directed     Call MD for:  severe or increased pain, loss or decreased feeling  in affected limb(s)  As directed     Call MD for:  temperature >100.5  As directed     Driving Restrictions  As directed     Comments:      No driving for 2 weeks    Increase activity slowly  As directed     Comments:      Walk with assistance use walker or cane as needed    Lifting restrictions  As directed     Comments:      No lifting for 12 weeks    May shower   As  directed     Scheduling Instructions:      Remove dry dressing and shower tomorrow with soap and water.    Resume previous diet  As directed         Med List    Warning      Cannot display patient medications because the patient has not yet arrived.        Verbal and written Discharge instructions given to the patient. Wound care per Discharge AVS   Signed: [facility] 04/20/2013, 8:01 AM  --- For VQI Registry use --- Instructions: Press F2 to tab through selections.  Delete question if not applicable.   Modified Rankin score at D/C (0-6): Rankin Score=0  IV medication needed for:  1. Hypertension: No 2. Hypotension: No  Post-op Complications: No  1. Post-op CVA or TIA: No  If yes: Event classification (right eye, left eye, right cortical, left cortical, verterobasilar, other):   If yes: Timing of event (intra-op, <6 hrs post-op, >=6 hrs post-op, unknown):   2. CN injury: No  If yes: CN  injuried   3. Myocardial infarction: No  If yes: Dx by (EKG or clinical, Troponin):   4.  CHF: No  5.  Dysrhythmia (new): No  6. Wound infection: No  7. Reperfusion symptoms: No  8. Return to OR: No  If yes: return to OR for (bleeding, neurologic, other CEA incision, other):   Discharge medications: Statin use:  Yes ASA use:  Yes Beta blocker use:  Yes ACE-Inhibitor use:  Yes P2Y12 Antagonist use: [x ] None, [ ]  Plavix, [ ]  Plasugrel, [ ]  Ticlopinine, [ ]  Ticagrelor, [ ]  Other, [ ]  No for medical reason, [ ]  Non-compliant, [ ]  Not-indicated Anti-coagulant use:  [x ] None, [ ]  Warfarin, [ ]  Rivaroxaban, [ ]  Dabigatran, [ ]  Other, [ ]  No for medical reason, [ ]  Non-compliant, [ ]  Not-indicated  Agree with plans for D/C today. He knows to stay on his Effient.   Waverly Ferrari, MD, FACS Beeper [phone] 04/20/2013

## 2013-04-21 ENCOUNTER — Ambulatory Visit: Payer: Medicaid Other | Admitting: Surgery

## 2013-04-21 ENCOUNTER — Encounter (HOSPITAL_COMMUNITY): Payer: Self-pay | Admitting: Surgery

## 2013-04-23 LAB — ANAEROBIC CULTURE

## 2013-04-25 ENCOUNTER — Telehealth: Payer: Self-pay | Admitting: Family Medicine

## 2013-04-25 ENCOUNTER — Other Ambulatory Visit: Payer: Self-pay | Admitting: *Deleted

## 2013-04-25 MED ORDER — OXYCODONE-ACETAMINOPHEN 5-325 MG PO TABS: 1.0000 | ORAL_TABLET | Freq: Four times a day (QID) | ORAL | Status: DC | PRN

## 2013-04-25 NOTE — Telephone Encounter (Signed)
Patient called with pain rated at 7 with movement of his neck (s/p I&D neck lymphocele by Dr. Myra Gianotti on 04-18-13) Dr. Myra Gianotti has OK'd this script but there will be NO more Rx's for pain medicine for this patient. Patient was informed of this and will come in today to pick this final Rx up.

## 2013-04-25 NOTE — Telephone Encounter (Signed)
I Called Dr [facility] to check if the pt show to his appt 04-24-13 @ 9:45pm . PT NOS to his appt

## 2013-06-27 ENCOUNTER — Encounter: Payer: Self-pay | Admitting: Surgery

## 2013-06-30 ENCOUNTER — Ambulatory Visit: Payer: Medicaid Other | Admitting: Surgery

## 2013-07-03 ENCOUNTER — Other Ambulatory Visit: Payer: Self-pay | Admitting: Cardiovascular Disease

## 2013-07-11 ENCOUNTER — Other Ambulatory Visit: Payer: Self-pay | Admitting: Cardiovascular Disease

## 2013-07-28 ENCOUNTER — Telehealth: Payer: Self-pay | Admitting: Cardiovascular Disease

## 2013-07-28 NOTE — Telephone Encounter (Signed)
Returned call to patient he stated his insurance will not cover effient.Effient 10 mg samples left at 3rd floor front desk.Patient's pharmacy called pharmacist stated effient requires a prior authorization.[facility] called [phone].Patient's ID # [id number] L.Prior Authorization in process will take 24 Hrs for a decision.PA # [id number].

## 2013-07-28 NOTE — Telephone Encounter (Signed)
New prob  Pt states that his insurance will not cover the Effient.

## 2013-07-30 NOTE — Telephone Encounter (Signed)
Spoke with medicaid and Effient has been approved for coverage through 07/23/2014. I called [facility] and gave them this information

## 2013-07-31 ENCOUNTER — Other Ambulatory Visit: Payer: Self-pay | Admitting: Cardiovascular Disease

## 2013-08-07 ENCOUNTER — Ambulatory Visit (INDEPENDENT_AMBULATORY_CARE_PROVIDER_SITE_OTHER): Payer: Medicaid Other | Admitting: Cardiovascular Disease

## 2013-08-07 ENCOUNTER — Other Ambulatory Visit: Payer: Self-pay | Admitting: Cardiovascular Disease

## 2013-08-07 ENCOUNTER — Encounter: Payer: Self-pay | Admitting: Cardiovascular Disease

## 2013-08-07 VITALS — BP 93/62 | HR 80 | Ht 69.0 in | Wt 210.0 lb

## 2013-08-07 DIAGNOSIS — I2510 Atherosclerotic heart disease of native coronary artery without angina pectoris: Secondary | ICD-10-CM

## 2013-08-07 DIAGNOSIS — I739 Peripheral vascular disease, unspecified: Secondary | ICD-10-CM

## 2013-08-07 DIAGNOSIS — Z720 Tobacco use: Secondary | ICD-10-CM

## 2013-08-07 DIAGNOSIS — F17200 Nicotine dependence, unspecified, uncomplicated: Secondary | ICD-10-CM

## 2013-08-07 MED ORDER — CARVEDILOL 3.125 MG PO TABS: 3.1250 mg | ORAL_TABLET | Freq: Two times a day (BID) | ORAL | Status: DC

## 2013-08-07 MED ORDER — CLOPIDOGREL BISULFATE 75 MG PO TABS: 75.0000 mg | ORAL_TABLET | Freq: Every day | ORAL | Status: AC

## 2013-08-07 MED ORDER — ISOSORBIDE MONONITRATE ER 30 MG PO TB24: 30.0000 mg | ORAL_TABLET | Freq: Every day | ORAL | Status: DC

## 2013-08-07 NOTE — Progress Notes (Signed)
History of Present Illness: 47 yo WM with history of ongoing tobacco abuse, anterior STEMI January 2013 s/p DES LAD x 1, HLD, PAD here today for PV and cardiac f/u. He had presented to the [facility] ED on 12/12/11 with c/o chest pain. He was found to have anterior ST segment elevation and had cardiac arrest in the [facility] ED. He was transported emergently to [facility] where I met him in the cath lab. Cath showed subtotal occlusion of the mid LAD. This was treated with a PTCA and placement of a 2.75 x 32 mm Promus Element DES and post-dilated with a 3.0 mm balloon. The third OM branch was chronically occluded and the RCA had mild disease. His post MI LVEF was 35%. He did well post MI and was discharged home with a LifeVest. I saw him 01/24/12 for follow up and he had c/o pain in both legs with walking. Non-invasive imaging with ABI of 0.53 on the right and 0.45 on the left with suggestion of aortoiliac inflow disease. There also appeared to be left subclavian artery stenosis on non-invasive imaging. I arranged a distal aortogram with bilateral lower ext runoff and left subclavian angiography on 01/31/12. His distal aorta was occluded just distal to the renals. His left subclavian was occluded at the ostium. I reviewed his films with vascular surgery but he was not a good candidate for aorto-bifemoral bypass at that time since he is on Effient. Follow up echo 02/16/12 with normal LV function. His Lifevest was d/c'ed. He was seen by Dr. Durene Cal in [facility] clinic and has had aorto-bifemoral bypass in February 2014 and left carotid to subclavian artery bypass May 2014. Lexiscan myoview 2013 with normal LV function, low risk study without any large areas of ischemia.   He has been doing well. He has stable chest pain. Occurs once per week. No change in breathing. He is smoking 6 cigarettes per day. His legs feel much better. Some right arm pain with exertion.    Primary Care Physician: None  Last Lipid Profile:Lipid  Panel     Component Value Date/Time   CHOL 237* 12/13/2011 0607   TRIG 329* 12/13/2011 0607   HDL 30* 12/13/2011 0607   CHOLHDL 7.9 12/13/2011 0607   VLDL 66* 12/13/2011 0607   LDLCALC 141* 12/13/2011 1610     Past Medical History  Diagnosis Date  . Anal fissure   . Rectal polyp   . Mixed hyperlipidemia     takes Simvastatin daily  . PAD (peripheral artery disease)   . GERD (gastroesophageal reflux disease)     "takes tums"  . Hypertension     takes Enalapril and Coreg daily   . Coronary atherosclerosis of native coronary artery     takes Effient but on hold for surgery  . MI (myocardial infarction)     AMI 1/13 - complicated by VT/Tosades  . Bruises easily     d/t being on Effient  . History of kidney stones   . Diabetes mellitus without complication     takes Lantus daily  . History of blood transfusion     no abnormal reaction noted    Past Surgical History  Procedure Laterality Date  . Appendectomy    . Diagnostic laparoscopy    . Anal fissurectomy    . Coronary angioplasty      stent placed Dec 12, 2011  . Aorta - bilateral femoral artery bypass graft N/A 01/16/2013    Procedure: AORTA BIFEMORAL BYPASS  GRAFT;  Surgeon: Nada Libman, MD;  Location: [facility] OR;  Service: Vascular;  Laterality: N/A;  . Colonoscopy    . Carotid-subclavian bypass graft Left 04/03/2013    Procedure: BYPASS GRAFT CAROTID-SUBCLAVIAN;  Surgeon: Nada Libman, MD;  Location: [facility] OR;  Service: Vascular;  Laterality: Left;  . I&d extremity Left 04/18/2013    Procedure: debridement of left neck lymphocele;  Surgeon: Nada Libman, MD;  Location: [facility] OR;  Service: Vascular;  Laterality: Left;  I and D of left neck    Current Outpatient Prescriptions  Medication Sig Dispense Refill  . aspirin 81 MG tablet Take 162 mg by mouth daily.       . carvedilol (COREG) 6.25 MG tablet TAKE ONE TABLET BY MOUTH TWICE DAILY WITH MEALS  60 tablet  0  . diazepam (VALIUM) 5 MG tablet Take 5 mg by mouth every 6 (six)  hours as needed for anxiety.      . enalapril (VASOTEC) 5 MG tablet TAKE ONE TABLET BY MOUTH TWICE DAILY  60 tablet  0  . glucose blood test strip Use to check blood sugar three time a day (fasting, at lunch time and a bedtime; before injecting lantus)  100 each  12  . glucose monitoring kit (FREESTYLE) monitoring kit 1 each by Does not apply route as needed for other.  1 each  0  . insulin glargine (LANTUS) 100 UNIT/ML injection Inject 30 Units into the skin at bedtime.       . Insulin Pen Needle (1ST CHOICE PEN NEEDLES) 31G X 8 MM MISC Use to inject lantus at bedtime as instructed  100 each  1  . Lancets (FREESTYLE) lancets Use as instructed  100 each  12  . nitroGLYCERIN (NITROSTAT) 0.4 MG SL tablet Place 0.4 mg under the tongue every 5 (five) minutes as needed for chest pain.      Marland Kitchen oxyCODONE-acetaminophen (PERCOCET/ROXICET) 5-325 MG per tablet Take 1-2 tablets by mouth every 6 (six) hours as needed.  30 tablet  0  . prasugrel (EFFIENT) 10 MG TABS Take 1 tablet (10 mg total) by mouth daily.  30 tablet  6  . simvastatin (ZOCOR) 40 MG tablet TAKE ONE TABLET BY MOUTH IN THE EVENING  30 tablet  6  . spironolactone (ALDACTONE) 25 MG tablet TAKE ONE-HALF TABLET BY MOUTH EVERY DAY  30 tablet  0  . traZODone (DESYREL) 50 MG tablet Take 50 mg by mouth at bedtime.      . varenicline (CHANTIX PAK) 0.5 MG X 11 & 1 MG X 42 tablet Take by mouth 2 (two) times daily. Take one 0.5 mg tablet by mouth once daily for 3 days, then increase to one 0.5 mg tablet twice daily for 4 days, then increase to one 1 mg tablet twice daily  HAS NOT STARTED YET 08/07/2013       No current facility-administered medications for this visit.    Allergies  Allergen Reactions  . Hydrocodone Nausea And Vomiting    History   Social History  . Marital Status: Divorced    Spouse Name: N/A    Number of Children: N/A  . Years of Education: N/A   Occupational History  .  Other   Social History Main Topics  . Smoking status:  Current Every Day Smoker -- 0.25 packs/day for 25 years    Types: Cigarettes  . Smokeless tobacco: Never Used  . Alcohol Use: 0.0 oz/week     Comment: occasional   .  Drug Use: No  . Sexual Activity: Yes   Other Topics Concern  . Not on file   Social History Narrative   [employer] - physical labor  - works night shift          Family History  Problem Relation Age of Onset  . Cancer Father     Lung  . Cancer Maternal Uncle     Colon  . Diabetes Mother   . COPD Mother     Review of Systems:  As stated in the HPI and otherwise negative.   BP 93/62  Pulse 80  Ht 5\' 9"  (1.753 m)  Wt 210 lb (95.255 kg)  BMI 31 kg/m2  Physical Examination: General: Well developed, well nourished, NAD HEENT: OP clear, mucus membranes moist SKIN: warm, dry. No rashes. Neuro: No focal deficits Musculoskeletal: Muscle strength 5/5 all ext Psychiatric: Mood and affect normal Neck: No JVD, no carotid bruits, no thyromegaly, no lymphadenopathy. Lungs:Clear bilaterally, no wheezes, rhonci, crackles Cardiovascular: Regular rate and rhythm. No murmurs, gallops or rubs. Abdomen:Soft. Bowel sounds present. Non-tender.  Extremities: No lower extremity edema. Pulses are 2 + in the bilateral DP/PT.  Assessment and Plan:   1. PAD: Stable. S/p aorto-bifemoral bypass and left carotid to subclavian artery bypass. Followed in [facility]. He has plans to see Dr. Myra Gianotti next week.   2. Coronary atherosclerosis of native coronary artery: Lexiscan myoview low risk with no large areas of ischemia September 2013. He has been on dual antiplatelet therapy with ASA and Effient. He can stop his Effient but will change to Plavix and continue both ASA and Plavix long term with his extensive coronary artery disease and PAD. Continue statin and beta blocker. Will lower Coreg to 3.125 mg po BID with hypotension. Will add Imdur 30 mg po Qdaily with occasional chest pains, likely small vessel disease.   3. Tobacco  abuse: Complete cessation encouraged. He is trying to stop.

## 2013-08-07 NOTE — Patient Instructions (Addendum)
Your physician wants you to follow-up in: 6 months.  You will receive a reminder letter in the mail two months in advance. If you don't receive a letter, please call our office to schedule the follow-up appointment.  Your physician has recommended you make the following change in your medication:  Stop Effient. Start Clopidogrel 75 mg by mouth daily. Decrease Coreg to 3.125 mg by mouth twice daily. Start Imdur (isosorbide mononitrate) 30 mg by mouth daily

## 2013-08-08 ENCOUNTER — Encounter: Payer: Self-pay | Admitting: Surgery

## 2013-08-11 ENCOUNTER — Encounter: Payer: Self-pay | Admitting: Surgery

## 2013-08-11 ENCOUNTER — Ambulatory Visit (INDEPENDENT_AMBULATORY_CARE_PROVIDER_SITE_OTHER): Payer: Medicaid Other | Admitting: Surgery

## 2013-08-11 VITALS — BP 118/73 | HR 79 | Ht 69.0 in | Wt 217.0 lb

## 2013-08-11 DIAGNOSIS — G458 Other transient cerebral ischemic attacks and related syndromes: Secondary | ICD-10-CM

## 2013-08-11 HISTORY — DX: Other transient cerebral ischemic attacks and related syndromes: G45.8

## 2013-08-11 NOTE — Progress Notes (Signed)
[facility] [place]   Patient name: Ryan [name] MRN: [id number] DOB: [birth year] Sex: male     Chief Complaint  Patient presents with  . Re-evaluation    2 wk f/u s/p redo left supraclavicular incision, I&D left neck lymphocele    HISTORY OF PRESENT ILLNESS: The patient is back today for followup. I initially met him in March of 2013. He suffered a MI in January 2013, and at that time he had a drug-eluting stent placed to his LAD. During this process, an abdominal aortogram revealed aortic occlusion at the level of the renal arteries. He also has a left subclavian artery occlusion. Because of his drug-eluting stent, I could not  proceed with surgical revascularization. He underwent aortobifemoral bypass grafting on 01/16/2013. He recovered nicely from this and on 04/03/2013 he underwent left carotid subclavian bypass graft. He had to go back to the operating room on 04/18/2013 for drainage of a lymphocele. He is now recovered from his operations. He has new complaints today for right arm pain which is worse with activity as well as pain in his left hamstring region which improves with activity.   Past Medical History  Diagnosis Date  . Anal fissure   . Rectal polyp   . Mixed hyperlipidemia     takes Simvastatin daily  . PAD (peripheral artery disease)   . GERD (gastroesophageal reflux disease)     "takes tums"  . Hypertension     takes Enalapril and Coreg daily   . Coronary atherosclerosis of native coronary artery     takes Effient but on hold for surgery  . MI (myocardial infarction)     AMI 1/13 - complicated by VT/Tosades  . Bruises easily     d/t being on Effient  . History of kidney stones   . Diabetes mellitus without complication     takes Lantus daily  . History of blood transfusion     no abnormal reaction noted    Past Surgical History  Procedure Laterality Date  . Appendectomy    . Diagnostic laparoscopy    . Anal fissurectomy    .  Coronary angioplasty      stent placed Dec 12, 2011  . Aorta - bilateral femoral artery bypass graft N/A 01/16/2013    Procedure: AORTA BIFEMORAL BYPASS GRAFT;  Surgeon: Nada Libman, MD;  Location: [facility] OR;  Service: Vascular;  Laterality: N/A;  . Colonoscopy    . Carotid-subclavian bypass graft Left 04/03/2013    Procedure: BYPASS GRAFT CAROTID-SUBCLAVIAN;  Surgeon: Nada Libman, MD;  Location: [facility] OR;  Service: Vascular;  Laterality: Left;  . I&d extremity Left 04/18/2013    Procedure: debridement of left neck lymphocele;  Surgeon: Nada Libman, MD;  Location: [facility] OR;  Service: Vascular;  Laterality: Left;  I and D of left neck    History   Social History  . Marital Status: Divorced    Spouse Name: N/A    Number of Children: N/A  . Years of Education: N/A   Occupational History  .  Other   Social History Main Topics  . Smoking status: Current Every Day Smoker -- 0.25 packs/day for 25 years    Types: Cigarettes  . Smokeless tobacco: Never Used  . Alcohol Use: 0.0 oz/week     Comment: occasional   . Drug Use: No  . Sexual Activity: Yes   Other Topics Concern  . Not on file   Social History Narrative   [employer] - physical labor  - works night shift          Family History  Problem Relation Age of Onset  . Cancer Father     Lung  . Cancer Maternal Uncle     Colon  . Diabetes Mother   . COPD Mother     Allergies as of 08/11/2013 - Review Complete 08/11/2013  Allergen Reaction Noted  . Hydrocodone Nausea And Vomiting 08/29/2012    Current Outpatient Prescriptions on File Prior to Visit  Medication Sig Dispense Refill  . aspirin 81 MG tablet Take 162 mg by mouth daily.       . carvedilol (COREG) 3.125 MG tablet Take 1 tablet (3.125 mg total) by mouth 2 (two) times daily with a meal.  60 tablet  11  . clopidogrel (PLAVIX) 75 MG tablet Take 1 tablet (75 mg total) by mouth daily.  30 tablet  11  . diazepam (VALIUM) 5 MG tablet Take 5 mg by mouth  every 6 (six) hours as needed for anxiety.      . enalapril (VASOTEC) 5 MG tablet TAKE ONE TABLET BY MOUTH TWICE DAILY  180 tablet  3  . glucose blood test strip Use to check blood sugar three time a day (fasting, at lunch time and a bedtime; before injecting lantus)  100 each  12  . glucose monitoring kit (FREESTYLE) monitoring kit 1 each by Does not apply route as needed for other.  1 each  0  . insulin glargine (LANTUS) 100 UNIT/ML injection Inject 30 Units into the skin at bedtime.       . Insulin Pen Needle (1ST CHOICE PEN NEEDLES) 31G X 8 MM MISC Use to inject lantus at bedtime as instructed  100 each  1  . isosorbide mononitrate (IMDUR) 30 MG 24 hr tablet Take 1 tablet (30 mg total) by mouth daily.  30 tablet  11  . Lancets (FREESTYLE) lancets Use as instructed  100 each  12  . nitroGLYCERIN (NITROSTAT) 0.4 MG SL tablet Place 0.4 mg under the tongue every 5 (five) minutes as needed for chest pain.      Marland Kitchen oxyCODONE-acetaminophen (PERCOCET/ROXICET) 5-325 MG per tablet Take 1-2 tablets by mouth every 6 (six) hours as needed.  30 tablet  0  . simvastatin (ZOCOR) 40 MG tablet TAKE ONE TABLET BY MOUTH IN THE EVENING  30 tablet  6  . spironolactone (ALDACTONE) 25 MG tablet TAKE ONE-HALF TABLET BY MOUTH EVERY DAY  30 tablet  0  . traZODone (DESYREL) 50 MG tablet Take 50 mg by mouth at bedtime.      . varenicline (CHANTIX PAK) 0.5 MG X 11 & 1 MG X 42 tablet Take by mouth 2 (two) times daily. Take one 0.5 mg tablet by mouth once daily for 3 days, then increase to one 0.5 mg tablet twice daily for 4 days, then increase to one 1 mg tablet twice daily  HAS NOT STARTED YET 08/07/2013       No current facility-administered medications on file prior to visit.     REVIEW OF SYSTEMS: Please see history of present illness, otherwise negative  PHYSICAL EXAMINATION:   Vital signs are BP 118/73  Pulse 79  Ht 5\' 9"  (1.753 m)  Wt 217 lb (98.431 kg)  BMI 32.03 kg/m2  SpO2 100% General: The patient appears  their stated age. HEENT:  No gross abnormalities Pulmonary:  Non labored breathing Abdomen: Soft and non-tender midline incision is healed  without evidence of hernia Musculoskeletal: There are no major deformities. Neurologic: No focal weakness or paresthesias are detected, Skin: There are no ulcer or rashes noted. Psychiatric: The patient has normal affect. Cardiovascular: There is a regular rate and rhythm without significant murmur appreciated. Palpable bilateral radial pulses and bilateral pedal pulses   Diagnostic Studies None  Assessment: Status post left carotid subclavian bypass Status post aortobifemoral bypass graft Plan: By physical exam the patient's left carotid subclavian bypass graft is widely patent. In addition he has a faintly palpable pedal pulses. I have discussed with the patient that I do not feel that his complaints today and his right arm as well as his left leg are related to blood flow issues. They appear to be musculoskeletal. I will plan on seeing him back in 6 months for followup carotid ultrasound to evaluate his carotid subclavian bypass graft, as well as ABIs. I have encouraged him to meet with his primary care physician to discuss his complaints in the right arm and left leg.  V. Durene Cal IV, M.D. [facility] [place] Office: [phone] Pager:  [phone]

## 2013-08-14 ENCOUNTER — Other Ambulatory Visit: Payer: Self-pay | Admitting: *Deleted

## 2013-08-14 DIAGNOSIS — Z48812 Encounter for surgical aftercare following surgery on the circulatory system: Secondary | ICD-10-CM

## 2013-08-14 DIAGNOSIS — I739 Peripheral vascular disease, unspecified: Secondary | ICD-10-CM

## 2013-08-14 DIAGNOSIS — G458 Other transient cerebral ischemic attacks and related syndromes: Secondary | ICD-10-CM

## 2013-09-01 ENCOUNTER — Telehealth: Payer: Self-pay | Admitting: Cardiovascular Disease

## 2013-09-01 NOTE — Telephone Encounter (Signed)
Returned call to patient he stated he is scheduled to have some teeth pulled Tuesday 09/09/13, wants to know if ok to hold plavix and aspirin.Message sent to Dr.McAlhany.

## 2013-09-01 NOTE — Telephone Encounter (Signed)
New Problem:  Pt states he is having some teeth pulled and he needs a form stating it's ok him to come off of blood thinner for a few days. Pt states he needs documentation faxed to [phone]. Pt states his dental procedure appt is on 10/7. Please call pt back

## 2013-09-02 NOTE — Telephone Encounter (Signed)
OK to hold ASA and Plavix for dental extraction for one week and resume after. Thanks, chris

## 2013-09-02 NOTE — Telephone Encounter (Signed)
Pt notified. Will fax this note to [facility] at number below

## 2013-10-09 ENCOUNTER — Other Ambulatory Visit: Payer: Self-pay

## 2013-10-14 ENCOUNTER — Other Ambulatory Visit: Payer: Self-pay | Admitting: Cardiovascular Disease

## 2014-01-02 ENCOUNTER — Other Ambulatory Visit: Payer: Self-pay | Admitting: Surgery

## 2014-01-02 DIAGNOSIS — I739 Peripheral vascular disease, unspecified: Secondary | ICD-10-CM

## 2014-01-02 DIAGNOSIS — Z48812 Encounter for surgical aftercare following surgery on the circulatory system: Secondary | ICD-10-CM

## 2014-01-15 ENCOUNTER — Other Ambulatory Visit: Payer: Self-pay

## 2014-01-15 ENCOUNTER — Telehealth: Payer: Self-pay

## 2014-01-15 MED ORDER — NITROGLYCERIN 0.4 MG SL SUBL: 0.4000 mg | SUBLINGUAL_TABLET | SUBLINGUAL | Status: DC | PRN

## 2014-01-15 NOTE — Telephone Encounter (Signed)
Ok to refill 

## 2014-01-15 NOTE — Telephone Encounter (Signed)
done

## 2014-01-15 NOTE — Telephone Encounter (Signed)
Patient is requesting refill on nitro is it ok to give

## 2014-02-04 ENCOUNTER — Ambulatory Visit: Payer: Medicaid Other | Admitting: Cardiovascular Disease

## 2014-02-05 ENCOUNTER — Other Ambulatory Visit: Payer: Self-pay

## 2014-02-05 ENCOUNTER — Emergency Department (INDEPENDENT_AMBULATORY_CARE_PROVIDER_SITE_OTHER)
Admission: EM | Admit: 2014-02-05 | Discharge: 2014-02-05 | Disposition: A | Discharge status: Home / Self Care | Payer: Self-pay | Source: Home / Self Care | Attending: Family Medicine | Admitting: Family Medicine

## 2014-02-05 ENCOUNTER — Encounter (HOSPITAL_COMMUNITY): Payer: Self-pay | Admitting: Emergency Medicine

## 2014-02-05 DIAGNOSIS — M549 Dorsalgia, unspecified: Secondary | ICD-10-CM

## 2014-02-05 DIAGNOSIS — G8929 Other chronic pain: Secondary | ICD-10-CM

## 2014-02-05 MED ORDER — NITROGLYCERIN 0.4 MG SL SUBL: 0.4000 mg | SUBLINGUAL_TABLET | SUBLINGUAL | Status: DC | PRN

## 2014-02-05 NOTE — ED Notes (Signed)
C/o lower back and left leg pain.  Hx of nerve damage and blocked arteries.  Having pain.  No relief with otc meds.  Denies injury and urinary symptoms.

## 2014-02-05 NOTE — ED Provider Notes (Signed)
Skanda Doub is a 48 y.o. male who presents to Urgent Care today for left leg pain. Patient has a history of chronic left leg pain secondary to peripheral arterial disease and radicular component. His pain has previously been controlled via his primary care provider and [facility]. He typically takes 90 tablets of oxycodone 10 per month. He ran out yesterday. He has not been back to the pain clinic nor his primary care provider yet. He denies any changes pain fevers chills nausea vomiting or diarrhea. He's tried over-the-counter medications which have not helped much. He states that it has been around a month since he was seen at [facility]   Past Medical History  Diagnosis Date  . Anal fissure   . Rectal polyp   . Mixed hyperlipidemia     takes Simvastatin daily  . PAD (peripheral artery disease)   . GERD (gastroesophageal reflux disease)     "takes tums"  . Hypertension     takes Enalapril and Coreg daily   . Coronary atherosclerosis of native coronary artery     takes Effient but on hold for surgery  . MI (myocardial infarction)     AMI 1/13 - complicated by VT/Tosades  . Bruises easily     d/t being on Effient  . History of kidney stones   . Diabetes mellitus without complication     takes Lantus daily  . History of blood transfusion     no abnormal reaction noted   History  Substance Use Topics  . Smoking status: Current Every Day Smoker -- 0.25 packs/day for 25 years    Types: Cigarettes  . Smokeless tobacco: Never Used  . Alcohol Use: 0.0 oz/week     Comment: occasional    ROS as above Medications:   Exam:  BP 126/80  Pulse 79  Temp(Src) 98.6 F (37 C) (Oral)  Resp 20  SpO2 100% Gen: Well NAD BACK: Nontender to spinal midline Lower extremity strength and sensation are intact. Reflexes are equal bilateral knees and ankle Normal non-antalgic gait. Sensation is intact throughout   I called the [facility]. Patient was  dismissed to clinic this morning because he abruptly left prior to a urine drug screen. He was not forthcoming with information in the initial history and physical.   Assessment and Plan: 48 y.o. male with chronic back and leg pain. Patient was not honest with me. I explained that I cannot prescribe pain medication in the setting and that he should followup with his primary care provider. I offered NSAIDs.  Patient declined and abruptly left AMA  Discussed warning signs or symptoms. Please see discharge instructions. Patient expresses understanding.    Rodolph Bong, MD 02/05/14 [phone]

## 2014-02-05 NOTE — ED Notes (Signed)
Accessed record for security

## 2014-02-06 ENCOUNTER — Ambulatory Visit: Payer: Medicaid Other | Admitting: Cardiovascular Disease

## 2014-02-06 ENCOUNTER — Encounter: Payer: Self-pay | Admitting: Family

## 2014-02-09 ENCOUNTER — Inpatient Hospital Stay (HOSPITAL_COMMUNITY): Admission: RE | Admit: 2014-02-09 | Payer: Medicaid Other | Source: Ambulatory Visit

## 2014-02-09 ENCOUNTER — Ambulatory Visit: Payer: Medicaid Other | Admitting: Surgery

## 2014-02-09 ENCOUNTER — Ambulatory Visit: Payer: Medicaid Other | Admitting: Family

## 2014-02-09 ENCOUNTER — Encounter (HOSPITAL_COMMUNITY): Payer: Medicaid Other

## 2014-02-09 ENCOUNTER — Other Ambulatory Visit (HOSPITAL_COMMUNITY): Payer: Medicaid Other

## 2014-03-03 ENCOUNTER — Encounter: Payer: Self-pay | Admitting: Vascular Surgery

## 2014-03-04 ENCOUNTER — Inpatient Hospital Stay (HOSPITAL_COMMUNITY): Admission: RE | Admit: 2014-03-04 | Payer: Self-pay | Source: Ambulatory Visit

## 2014-03-04 ENCOUNTER — Ambulatory Visit: Payer: Self-pay | Admitting: Family

## 2014-03-12 ENCOUNTER — Encounter: Payer: Self-pay | Admitting: Cardiology

## 2014-03-16 ENCOUNTER — Encounter: Payer: Self-pay | Admitting: Cardiology

## 2014-03-26 ENCOUNTER — Ambulatory Visit (INDEPENDENT_AMBULATORY_CARE_PROVIDER_SITE_OTHER): Payer: Medicaid Other | Admitting: Cardiovascular Disease

## 2014-03-26 ENCOUNTER — Encounter: Payer: Self-pay | Admitting: Cardiovascular Disease

## 2014-03-26 VITALS — BP 120/86 | HR 96 | Ht 69.0 in | Wt 201.0 lb

## 2014-03-26 DIAGNOSIS — M79606 Pain in leg, unspecified: Secondary | ICD-10-CM

## 2014-03-26 DIAGNOSIS — I2510 Atherosclerotic heart disease of native coronary artery without angina pectoris: Secondary | ICD-10-CM

## 2014-03-26 DIAGNOSIS — M79609 Pain in unspecified limb: Secondary | ICD-10-CM

## 2014-03-26 DIAGNOSIS — I739 Peripheral vascular disease, unspecified: Secondary | ICD-10-CM

## 2014-03-26 DIAGNOSIS — F17200 Nicotine dependence, unspecified, uncomplicated: Secondary | ICD-10-CM

## 2014-03-26 DIAGNOSIS — Z720 Tobacco use: Secondary | ICD-10-CM

## 2014-03-26 MED ORDER — OXYCODONE-ACETAMINOPHEN 5-325 MG PO TABS: 1.0000 | ORAL_TABLET | Freq: Four times a day (QID) | ORAL | Status: DC | PRN

## 2014-03-26 MED ORDER — NITROGLYCERIN 0.4 MG SL SUBL: 0.4000 mg | SUBLINGUAL_TABLET | SUBLINGUAL | Status: DC | PRN

## 2014-03-26 MED ORDER — SIMVASTATIN 40 MG PO TABS: ORAL_TABLET | ORAL | Status: DC

## 2014-03-26 NOTE — Progress Notes (Signed)
History of Present Illness: 48 yo WM with history of ongoing tobacco abuse, anterior STEMI January 2013 s/p DES LAD x 1, HLD, PAD here today for cardiac f/u. He had presented to the [facility] ED on 12/12/11 with c/o chest pain. He was found to have anterior ST segment elevation and had cardiac arrest in the [facility] ED. He was transported emergently to [facility] where I met him in the cath lab. Cath showed subtotal occlusion of the mid LAD. This was treated with a PTCA and placement of a 2.75 x 32 mm Promus Element DES and post-dilated with a 3.0 mm balloon. The third OM branch was chronically occluded and the RCA had mild disease. His post MI LVEF was 35%. He did well post MI and was discharged home with a LifeVest. I saw him 01/24/12 for follow up and he had c/o pain in both legs with walking. Non-invasive imaging with ABI of 0.53 on the right and 0.45 on the left with suggestion of aortoiliac inflow disease. There also appeared to be left subclavian artery stenosis on non-invasive imaging. I arranged a distal aortogram with bilateral lower ext runoff and left subclavian angiography on 01/31/12. His distal aorta was occluded just distal to the renals. His left subclavian was occluded at the ostium. I reviewed his films with vascular surgery but he was not a good candidate for aorto-bifemoral bypass at that time since he is on Effient. Follow up echo 02/16/12 with normal LV function. His Lifevest was d/c'ed. He was seen by Dr. Annamarie Major in [facility] clinic and has had aorto-bifemoral bypass in February 2014 and left carotid to subclavian artery bypass May 2014. He is still followed in [facility] by Dr. Trula Slade. Carlton Adam myoview 2013 with normal LV function, low risk study without any large areas of ischemia.   He is here today for follow up. He has been having more frequent chest pains with exertion. He has no energy. No change in breathing. He is smoking 10 cigarettes per day. Chronic left leg pain and numbness. He has  been dismissed from the pain clinic due to refusing to take drug screen.   Primary Care Physician: Annamarie Major  Last Lipid Profile:Lipid Panel     Component Value Date/Time   CHOL 237* 12/13/2011 0607   TRIG 329* 12/13/2011 0607   HDL 30* 12/13/2011 0607   CHOLHDL 7.9 12/13/2011 0607   VLDL 66* 12/13/2011 0607   LDLCALC 141* 12/13/2011 9741     Past Medical History  Diagnosis Date  . Anal fissure   . Rectal polyp   . Mixed hyperlipidemia     takes Simvastatin daily  . PAD (peripheral artery disease)   . GERD (gastroesophageal reflux disease)     "takes tums"  . Hypertension     takes Enalapril and Coreg daily   . Coronary atherosclerosis of native coronary artery     takes Effient but on hold for surgery  . MI (myocardial infarction)     AMI 6/38 - complicated by VT/Tosades  . Bruises easily     d/t being on Effient  . History of kidney stones   . Diabetes mellitus without complication     takes Lantus daily  . History of blood transfusion     no abnormal reaction noted    Past Surgical History  Procedure Laterality Date  . Appendectomy    . Diagnostic laparoscopy    . Anal fissurectomy    . Coronary angioplasty  stent placed Dec 12, 2011  . Aorta - bilateral femoral artery bypass graft N/A 01/16/2013    Procedure: AORTA BIFEMORAL BYPASS GRAFT;  Surgeon: Serafina Mitchell, MD;  Location: [place];  Service: Vascular;  Laterality: N/A;  . Colonoscopy    . Carotid-subclavian bypass graft Left 04/03/2013    Procedure: BYPASS GRAFT CAROTID-SUBCLAVIAN;  Surgeon: Serafina Mitchell, MD;  Location: [facility] OR;  Service: Vascular;  Laterality: Left;  . I&d extremity Left 04/18/2013    Procedure: debridement of left neck lymphocele;  Surgeon: Serafina Mitchell, MD;  Location: [facility] OR;  Service: Vascular;  Laterality: Left;  I and D of left neck    Current Outpatient Prescriptions  Medication Sig Dispense Refill  . aspirin 81 MG tablet Take 162 mg by mouth daily.       . carvedilol (COREG) 3.125 MG  tablet Take 1 tablet (3.125 mg total) by mouth 2 (two) times daily with a meal.  60 tablet  11  . clopidogrel (PLAVIX) 75 MG tablet Take 1 tablet (75 mg total) by mouth daily.  30 tablet  11  . diazepam (VALIUM) 5 MG tablet Take 5 mg by mouth every 6 (six) hours as needed for anxiety.      . enalapril (VASOTEC) 5 MG tablet TAKE ONE TABLET BY MOUTH TWICE DAILY  180 tablet  3  . glucose blood test strip Use to check blood sugar three time a day (fasting, at lunch time and a bedtime; before injecting lantus)  100 each  12  . glucose monitoring kit (FREESTYLE) monitoring kit 1 each by Does not apply route as needed for other.  1 each  0  . insulin glargine (LANTUS) 100 UNIT/ML injection Inject 30 Units into the skin at bedtime.       . Insulin Pen Needle (1ST CHOICE PEN NEEDLES) 31G X 8 MM MISC Use to inject lantus at bedtime as instructed  100 each  1  . isosorbide mononitrate (IMDUR) 30 MG 24 hr tablet Take 1 tablet (30 mg total) by mouth daily.  30 tablet  11  . Lancets (FREESTYLE) lancets Use as instructed  100 each  12  . nitroGLYCERIN (NITROSTAT) 0.4 MG SL tablet Place 1 tablet (0.4 mg total) under the tongue every 5 (five) minutes as needed for chest pain.  25 tablet  3  . oxyCODONE-acetaminophen (PERCOCET/ROXICET) 5-325 MG per tablet Take 1-2 tablets by mouth every 6 (six) hours as needed.  30 tablet  0  . simvastatin (ZOCOR) 40 MG tablet TAKE ONE TABLET BY MOUTH IN THE EVENING  30 tablet  6  . spironolactone (ALDACTONE) 25 MG tablet TAKE ONE-HALF TABLET BY MOUTH ONCE DAILYPATIENT NEEDS TO SCHEDULE AN APPOINTMENT  15 tablet  5  . traZODone (DESYREL) 50 MG tablet Take 50 mg by mouth at bedtime.      . varenicline (CHANTIX PAK) 0.5 MG X 11 & 1 MG X 42 tablet Take by mouth 2 (two) times daily. Take one 0.5 mg tablet by mouth once daily for 3 days, then increase to one 0.5 mg tablet twice daily for 4 days, then increase to one 1 mg tablet twice daily  HAS NOT STARTED YET 08/07/2013       No current  facility-administered medications for this visit.    Allergies  Allergen Reactions  . Hydrocodone Nausea And Vomiting    History   Social History  . Marital Status: Divorced    Spouse Name: N/A    Number  of Children: N/A  . Years of Education: N/A   Occupational History  .  Other   Social History Main Topics  . Smoking status: Current Every Day Smoker -- 0.25 packs/day for 25 years    Types: Cigarettes  . Smokeless tobacco: Never Used  . Alcohol Use: 0.0 oz/week     Comment: occasional   . Drug Use: No  . Sexual Activity: Yes   Other Topics Concern  . Not on file   Social History Narrative   [employer] - physical labor  - works night shift          Family History  Problem Relation Age of Onset  . Cancer Father     Lung  . Cancer Maternal Uncle     Colon  . Diabetes Mother   . COPD Mother     Review of Systems:  As stated in the HPI and otherwise negative.   BP 120/86  Pulse 96  Ht _0  (1.753 m)  Wt 201 lb (91.173 kg)  BMI 29.67 kg/m2  Physical Examination: General: Well developed, well nourished, NAD HEENT: OP clear, mucus membranes moist SKIN: warm, dry. No rashes. Neuro: No focal deficits Musculoskeletal: Muscle strength 5/5 all ext Psychiatric: Mood and affect normal Neck: No JVD, no carotid bruits, no thyromegaly, no lymphadenopathy. Lungs:Clear bilaterally, no wheezes, rhonci, crackles Cardiovascular: Regular rate and rhythm. No murmurs, gallops or rubs. Abdomen:Soft. Bowel sounds present. Non-tender.  Extremities: No lower extremity edema. Pulses are 2 + in the bilateral DP/PT.  EKG: NSR, rate 96 bpm. Prior septal infarct.   Assessment and Plan:   1. PAD: Stable. S/p aorto-bifemoral bypass and left carotid to subclavian artery bypass. Followed in [facility]. Left leg going numb but distal pulses are intact. He has an appt with Brabham next month. I told him to schedule sooner if he has more issues with his leg.   2. CAD: s/p  anterior STEMI January 2013 with occluded LAD treated with bare metal stent. He has been having more chest pains and fatigue. Will arrange Lexiscan myoview to exclude ischemia.  He has been on dual antiplatelet therapy with ASA and Plavix. Continue statin and beta blocker.   3. Tobacco abuse: Complete cessation encouraged. He is trying to stop.   4. Chronic leg pain: Distal pulses are ok today. I suspect his left leg pain/numbness is referred neurological pain. He had been followed in pain clinic but was dismissed. Seen in Urgent Care 02/05/14 but left AMA when he was told he would not be given narcotics. I believe his pain is real but I cannot offer any further workup. Will give him 30 pills of Percocet today since he cannot get into primary care for several weeks but I will not write for narcotics again. He understands this.   Burnell Blanks 03/27/2014 12:48 PM

## 2014-03-26 NOTE — Patient Instructions (Signed)
Your physician wants you to follow-up in: 6 months.   You will receive a reminder letter in the mail two months in advance. If you don't receive a letter, please call our office to schedule the follow-up appointment.  Your physician has requested that you have a lexiscan myoview. For further information please visit [email or web address]. Please follow instruction sheet, as given.   

## 2014-03-29 ENCOUNTER — Emergency Department (HOSPITAL_COMMUNITY)
Admission: EM | Admit: 2014-03-29 | Discharge: 2014-03-29 | Disposition: A | Discharge status: Home / Self Care | Payer: Medicaid Other | Attending: Emergency Medicine | Admitting: Emergency Medicine

## 2014-03-29 ENCOUNTER — Encounter (HOSPITAL_COMMUNITY): Payer: Self-pay | Admitting: Emergency Medicine

## 2014-03-29 DIAGNOSIS — I252 Old myocardial infarction: Secondary | ICD-10-CM | POA: Insufficient documentation

## 2014-03-29 DIAGNOSIS — Z7902 Long term (current) use of antithrombotics/antiplatelets: Secondary | ICD-10-CM | POA: Insufficient documentation

## 2014-03-29 DIAGNOSIS — G8929 Other chronic pain: Secondary | ICD-10-CM

## 2014-03-29 DIAGNOSIS — I10 Essential (primary) hypertension: Secondary | ICD-10-CM | POA: Insufficient documentation

## 2014-03-29 DIAGNOSIS — I2510 Atherosclerotic heart disease of native coronary artery without angina pectoris: Secondary | ICD-10-CM | POA: Insufficient documentation

## 2014-03-29 DIAGNOSIS — Z87442 Personal history of urinary calculi: Secondary | ICD-10-CM | POA: Insufficient documentation

## 2014-03-29 DIAGNOSIS — F17200 Nicotine dependence, unspecified, uncomplicated: Secondary | ICD-10-CM | POA: Insufficient documentation

## 2014-03-29 DIAGNOSIS — E119 Type 2 diabetes mellitus without complications: Secondary | ICD-10-CM | POA: Insufficient documentation

## 2014-03-29 DIAGNOSIS — Z951 Presence of aortocoronary bypass graft: Secondary | ICD-10-CM | POA: Insufficient documentation

## 2014-03-29 DIAGNOSIS — Z794 Long term (current) use of insulin: Secondary | ICD-10-CM | POA: Insufficient documentation

## 2014-03-29 DIAGNOSIS — R209 Unspecified disturbances of skin sensation: Secondary | ICD-10-CM | POA: Insufficient documentation

## 2014-03-29 DIAGNOSIS — K219 Gastro-esophageal reflux disease without esophagitis: Secondary | ICD-10-CM | POA: Insufficient documentation

## 2014-03-29 DIAGNOSIS — E782 Mixed hyperlipidemia: Secondary | ICD-10-CM | POA: Insufficient documentation

## 2014-03-29 DIAGNOSIS — Z7982 Long term (current) use of aspirin: Secondary | ICD-10-CM | POA: Insufficient documentation

## 2014-03-29 DIAGNOSIS — Z9861 Coronary angioplasty status: Secondary | ICD-10-CM | POA: Insufficient documentation

## 2014-03-29 DIAGNOSIS — Z79899 Other long term (current) drug therapy: Secondary | ICD-10-CM | POA: Insufficient documentation

## 2014-03-29 MED ORDER — OXYCODONE-ACETAMINOPHEN 5-325 MG PO TABS
1.0000 | ORAL_TABLET | Freq: Once | ORAL | Status: AC
  Administered 2014-03-29: 1 via ORAL
  Filled 2014-03-29: qty 1

## 2014-03-29 MED ORDER — OXYCODONE-ACETAMINOPHEN 5-325 MG PO TABS: 1.0000 | ORAL_TABLET | ORAL | Status: DC | PRN

## 2014-03-29 NOTE — ED Notes (Addendum)
Pt reports left leg pain and intermittent left leg numbness x2 days. Pt reports history of artificial vein graphs into BLE. Pt reports not able to bear weight to left leg. Pt denies any known fevers. Pt denies any changes in vision,weakness, fevers,sob or chest pain. Pt reports has appointment with vein specialist this week. Cns intact. Full ROM noted to LLE. Pt reports LLE "gives out" when i try and stand on it. Equal warmth noted to BLE.

## 2014-03-29 NOTE — ED Provider Notes (Signed)
CSN: [id number]     Arrival date & time 03/29/14  1735 History   First MD Initiated Contact with Patient 03/29/14 1807     Chief Complaint  Patient presents with  . Leg Pain     (Consider location/radiation/quality/duration/timing/severity/associated sxs/prior Treatment) Patient is a 48 y.o. male presenting with leg pain. The history is provided by the patient.  Leg Pain  He complains of pain and numbness in his left leg. The discomfort is ongoing. He saw his cardiologist about it 3 days ago and was prescribed Percocet. He has previously been in pain management for this by did not comply with regulations, so was discharged. He denies fever, chills, nausea, vomiting, weakness, or dizziness. The pain in his leg is worse with walking. He is taking his usual medications, without relief. There are no other known modifying factors.  Past Medical History  Diagnosis Date  . Anal fissure   . Rectal polyp   . Mixed hyperlipidemia     takes Simvastatin daily  . PAD (peripheral artery disease)   . GERD (gastroesophageal reflux disease)     "takes tums"  . Hypertension     takes Enalapril and Coreg daily   . Coronary atherosclerosis of native coronary artery     takes Effient but on hold for surgery  . MI (myocardial infarction)     AMI 1/13 - complicated by VT/Tosades  . Bruises easily     d/t being on Effient  . History of kidney stones   . Diabetes mellitus without complication     takes Lantus daily  . History of blood transfusion     no abnormal reaction noted   Past Surgical History  Procedure Laterality Date  . Appendectomy    . Diagnostic laparoscopy    . Anal fissurectomy    . Coronary angioplasty      stent placed Dec 12, 2011  . Aorta - bilateral femoral artery bypass graft N/A 01/16/2013    Procedure: AORTA BIFEMORAL BYPASS GRAFT;  Surgeon: Nada LibmanVance W Brabham, MD;  Location: [facility] OR;  Service: Vascular;  Laterality: N/A;  . Colonoscopy    . Carotid-subclavian bypass graft Left  04/03/2013    Procedure: BYPASS GRAFT CAROTID-SUBCLAVIAN;  Surgeon: Nada LibmanVance W Brabham, MD;  [facility];  Service: Vascular;  Laterality: Left;  . I&d extremity Left 04/18/2013    Procedure: debridement of left neck lymphocele;  Surgeon: Nada LibmanVance W Brabham, MD;  [facility];  Service: Vascular;  Laterality: Left;  I and D of left neck   Family History  Problem Relation Age of Onset  . Cancer Father     Lung  . Cancer Maternal Uncle     Colon  . Diabetes Mother   . COPD Mother    History  Substance Use Topics  . Smoking status: Current Every Day Smoker -- 0.25 packs/day for 25 years    Types: Cigarettes  . Smokeless tobacco: Never Used  . Alcohol Use: 0.0 oz/week     Comment: occasional     Review of Systems  All other systems reviewed and are negative.     Allergies  Hydrocodone  Home Medications   Prior to Admission medications   Medication Sig Start Date End Date Taking? Authorizing Provider  aspirin 81 MG tablet Take 162 mg by mouth daily.    Yes Historical Provider, MD  carvedilol (COREG) 3.125 MG tablet Take 3.125 mg by mouth 2 (two) times daily with a meal. 08/07/13  Yes Nile Dearhristopher D  McAlhany, MD  clopidogrel (PLAVIX) 75 MG tablet Take 1 tablet (75 mg total) by mouth daily. 08/07/13 08/07/14 Yes Kathleene Hazel, MD  enalapril (VASOTEC) 5 MG tablet Take 5 mg by mouth 2 (two) times daily.   Yes Historical Provider, MD  insulin glargine (LANTUS) 100 UNIT/ML injection Inject 35 Units into the skin at bedtime.  02/19/13  Yes Leroy Sea, MD  isosorbide mononitrate (IMDUR) 30 MG 24 hr tablet Take 1 tablet (30 mg total) by mouth daily. 08/07/13 08/07/14 Yes Kathleene Hazel, MD  simvastatin (ZOCOR) 40 MG tablet Take 40 mg by mouth at bedtime.   Yes Historical Provider, MD  spironolactone (ALDACTONE) 25 MG tablet Take 12.5 mg by mouth daily.   Yes Historical Provider, MD  nitroGLYCERIN (NITROSTAT) 0.4 MG SL tablet Place 1 tablet (0.4 mg total) under the tongue every  5 (five) minutes as needed for chest pain. 03/26/14   Kathleene Hazel, MD   BP 146/80  Pulse 92  Temp(Src) 98.4 F (36.9 C) (Oral)  Resp 16  Ht 5\' 8"  (1.727 m)  Wt 201 lb (91.173 kg)  BMI 30.57 kg/m2  SpO2 99% Physical Exam  Nursing note and vitals reviewed. Constitutional: He is oriented to person, place, and time. He appears well-developed and well-nourished. No distress.  HENT:  Head: Normocephalic and atraumatic.  Right Ear: External ear normal.  Left Ear: External ear normal.  Eyes: Conjunctivae and EOM are normal. Pupils are equal, round, and reactive to light.  Neck: Normal range of motion and phonation normal. Neck supple.  Cardiovascular: Normal rate and intact distal pulses.   Pulmonary/Chest: Effort normal. He exhibits no bony tenderness.  Abdominal: Soft. There is no tenderness.  Musculoskeletal: Normal range of motion. He exhibits tenderness (left posterior thigh, mild. Left foot is warm, and has normal capillary refill.). He exhibits no edema.  No swelling or deformity of the lower legs   Neurological: He is alert and oriented to person, place, and time. No cranial nerve deficit or sensory deficit. He exhibits normal muscle tone. Coordination normal.  Skin: Skin is warm, dry and intact.  Psychiatric: He has a normal mood and affect. His behavior is normal. Judgment and thought content normal.    ED Course  Procedures (including critical care time)  Medications  oxyCODONE-acetaminophen (PERCOCET/ROXICET) 5-325 MG per tablet 1 tablet (1 tablet Oral Given 03/29/14 1838)    Patient Vitals for the past 24 hrs:  BP Temp Temp src Pulse Resp SpO2 Height Weight  03/29/14 1804 146/80 mmHg 98.4 F (36.9 C) Oral 92 16 99 % - -  03/29/14 1802 - - - - - - 5\' 8"  (1.727 m) 201 lb (91.173 kg)     Labs Review Labs Reviewed - No data to display    MDM   Final diagnoses:  Chronic pain    Evaluation today, is consistent with chronic left leg pain. There is no  sign of arterial or venous occlusions  Nursing Notes Reviewed/ Care Coordinated Applicable Imaging Reviewed Interpretation of Laboratory Data incorporated into ED treatment  The patient appears reasonably screened and/or stabilized for discharge and I doubt any other medical condition or other [facility] requiring further screening, evaluation, or treatment in the ED at this time prior to discharge.  Plan: Home Medications- Percocet; Home Treatments- rest; return here if the recommended treatment, does not improve the symptoms; Recommended follow up- PCP asap. Referral to Pain Management/Neurology- [place]    Flint Melter, MD 03/29/14 [phone]

## 2014-04-02 ENCOUNTER — Encounter: Payer: Self-pay | Admitting: Family

## 2014-04-03 ENCOUNTER — Ambulatory Visit: Payer: Self-pay | Admitting: Family

## 2014-04-03 ENCOUNTER — Inpatient Hospital Stay (HOSPITAL_COMMUNITY): Admission: RE | Admit: 2014-04-03 | Payer: Self-pay | Source: Ambulatory Visit

## 2014-04-03 ENCOUNTER — Encounter (HOSPITAL_COMMUNITY): Payer: Self-pay

## 2014-04-09 ENCOUNTER — Encounter (HOSPITAL_COMMUNITY): Payer: Medicaid Other

## 2014-04-30 ENCOUNTER — Encounter: Payer: Self-pay | Admitting: Family

## 2014-05-01 ENCOUNTER — Encounter (HOSPITAL_COMMUNITY): Payer: Self-pay

## 2014-05-01 ENCOUNTER — Other Ambulatory Visit (HOSPITAL_COMMUNITY): Payer: Self-pay

## 2014-05-01 ENCOUNTER — Ambulatory Visit: Payer: Self-pay | Admitting: Family

## 2014-05-25 ENCOUNTER — Encounter: Payer: Self-pay | Admitting: Vascular Surgery

## 2014-05-26 ENCOUNTER — Ambulatory Visit: Payer: Self-pay | Admitting: Family

## 2014-05-26 ENCOUNTER — Other Ambulatory Visit (HOSPITAL_COMMUNITY): Payer: Self-pay

## 2014-05-26 ENCOUNTER — Encounter (HOSPITAL_COMMUNITY): Payer: Self-pay

## 2014-06-15 ENCOUNTER — Encounter: Payer: Self-pay | Admitting: Family

## 2014-06-16 ENCOUNTER — Ambulatory Visit: Payer: Self-pay | Admitting: Family

## 2014-06-16 ENCOUNTER — Encounter (HOSPITAL_COMMUNITY): Payer: Self-pay

## 2014-06-16 ENCOUNTER — Other Ambulatory Visit (HOSPITAL_COMMUNITY): Payer: Self-pay

## 2014-06-16 IMAGING — CT CT ANGIO AOBIFEM WO/W CM
1 of 10 series · 9 of 33 positions shown · IV contrast ([ID] OMNI 350)
Comparison: None.

CLINICAL DATA: Bilateral leg pain, history of failed vascular
stent placement

CT ANGIOGRAPHY OF ABDOMINAL AORTA WITH ILIOFEMORAL RUNOFF
TECHNIQUE: Multidetector CT imaging of the abdomen, pelvis and
lower extremities was performed using the standard protocol during
bolus administration of intravenous contrast.  Multiplanar CT image
reconstructions including MIPs were obtained to evaluate the
vascular anatomy.
Contrast: 165mL OMNIPAQUE IOHEXOL 350 MG/ML SOLN

[Series 5: runoff · axial · 0.86mm/px · z∈[-1146,-71]mm · 9 of 519 slices shown]
[im 52/519  soft-tissue]
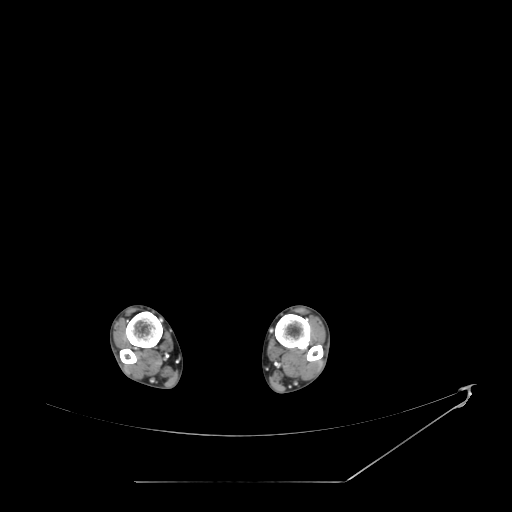
[im 104/519  bone]
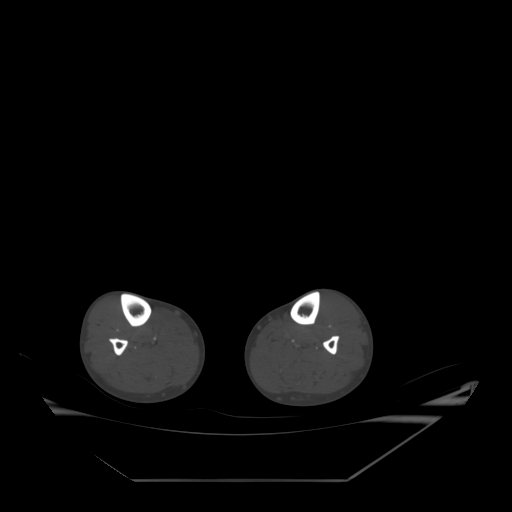
[im 156/519  soft-tissue]
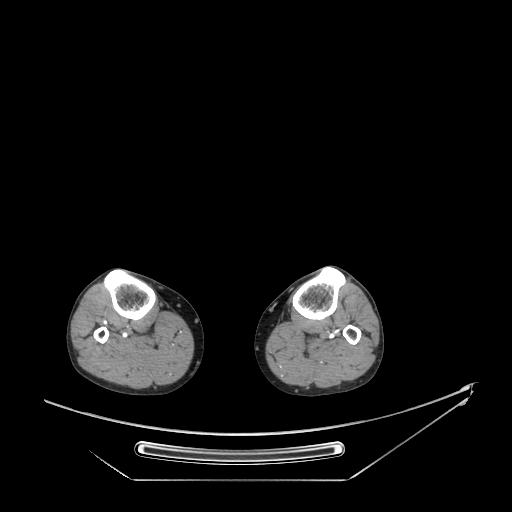
[im 208/519  bone]
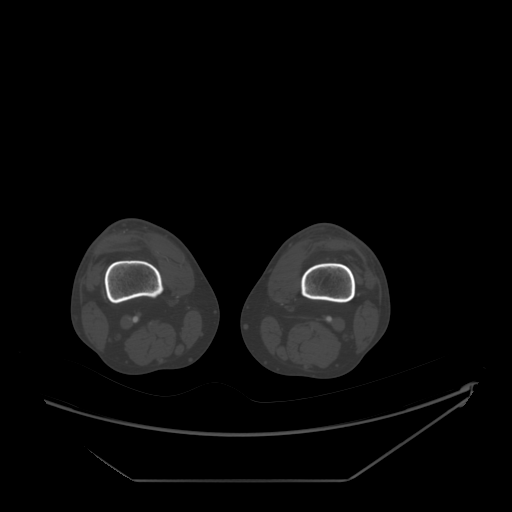
[im 260/519  soft-tissue]
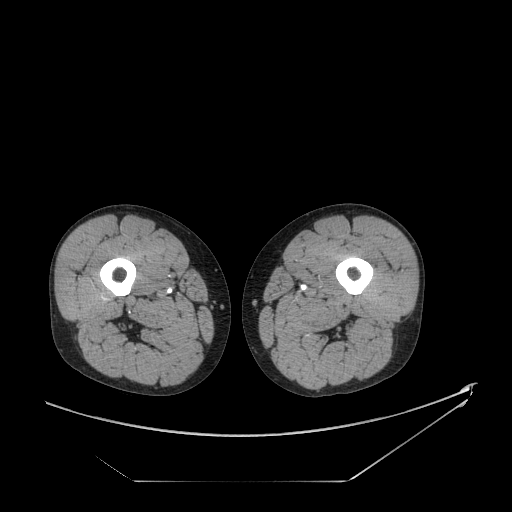
[im 311/519  bone]
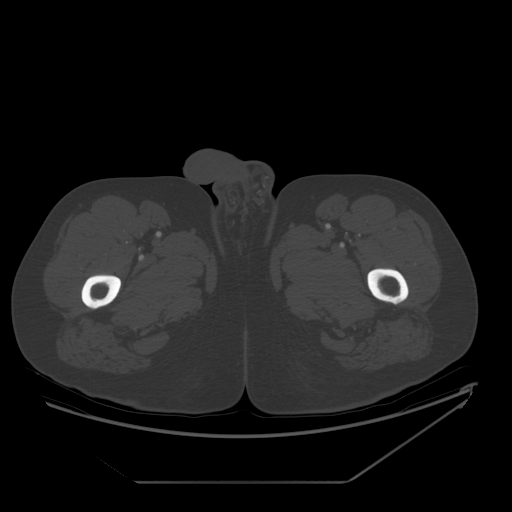
[im 363/519  soft-tissue]
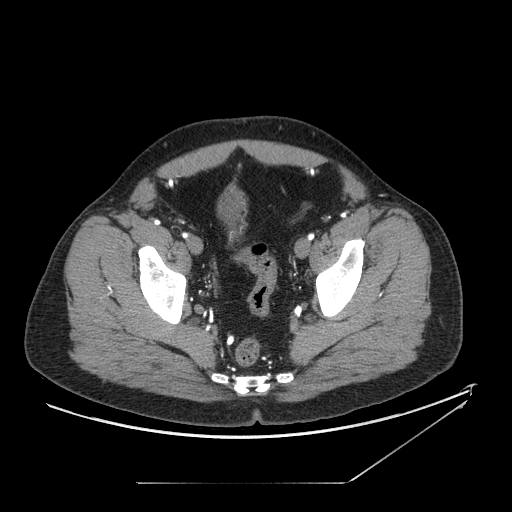
[im 415/519  bone]
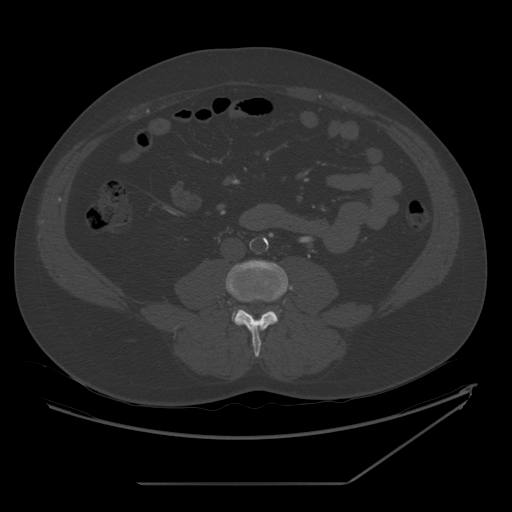
[im 467/519  soft-tissue]
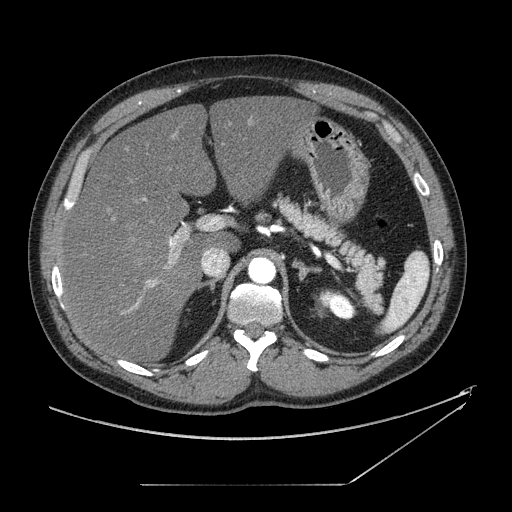

[9 of 33 positions shown; findings below may reference images not displayed]

FINDINGS: Aorta:  The celiac axis and superior mesenteric artery are well
visualized without significant narrowing.  Dual renal arteries are
noted on the left with a single renal artery on the right.  Just
below the origin of the renal arteries bilaterally there is
occlusion of the native aorta with peripheral calcification.  This
extends distally to the distal common iliac artery on the right and
to the level of the iliac bifurcation on the left.  The inferior
mesenteric artery is occluded at its origin but repeat instituted
via of multiple collaterals from the superior mesenteric artery.
Distal collaterals from the inferior mesenteric artery help to
reconstitute the internal iliac arteries bilaterally.
Additionally, mildly hypertrophied inferior epigastric arteries are
noted bilaterally.  Collaterals to the deep circumflex iliac
arteries are noted bilaterally as well.

Right Lower Extremity:  On the right, there is reconstitution of
the distal aspect of the common iliac artery with opacification of
both the external and internal iliac arteries. They are diminutive
in size due to the proximal aortic occlusion.  The common femoral
bifurcation is patent and the superficial femoral artery
demonstrates mild atherosclerotic change without focal
hemodynamically significant stenosis.  Popliteal artery and
popliteal trifurcation are patent with three-vessel runoff to the
level of the right ankle.

Left Lower Extremity:  On the left, there is reconstitution of the
iliac bifurcation.  Again the internal and external iliac arteries
are somewhat diminutive to into the proximal occlusion.  The common
femoral artery and superficial femoral artery are patent.  No focal
stenosis is seen.  Mild atherosclerotic changes are noted similar
to that seen on the right.  Popliteal artery and popliteal
trifurcation are patent with single vessel runoff to the level of
the left ankle.

The lung bases are free of acute infiltrate or sizable effusion.
The liver is diffusely fatty infiltrated.  The gallbladder, adrenal
glands, spleen and pancreas are all normal on the CT appearance.
The kidneys show a normal enhancement pattern.  There are changes
consistent with renal calculi on the left.  No other acute
abnormality is noted within the abdomen and pelvis.  The bony
structures are within normal limits.

 Review of the MIP images confirms the above findings.
IMPRESSION: Aortic occlusion just below the level of the renal arteries as
described.  There is reconstitution distally at the level of the
iliac bifurcations predominately via collaterals from the inferior
mesenteric artery as well as the apex circumflex iliac arteries
bilaterally.  Collateral flow from the inferior epigastric artery
is also noted bilaterally.

Very mild atherosclerotic changes noted below the level of the
common femoral arteries bilaterally.  Three-vessel runoff is noted
bilaterally.

Findings consistent with left renal calculi which are
nonobstructive in nature.

## 2014-07-07 ENCOUNTER — Encounter (HOSPITAL_COMMUNITY): Payer: Self-pay | Admitting: Emergency Medicine

## 2014-07-07 ENCOUNTER — Emergency Department (HOSPITAL_COMMUNITY)
Admission: EM | Admit: 2014-07-07 | Discharge: 2014-07-07 | Disposition: A | Discharge status: Home / Self Care | Payer: Medicaid Other | Attending: Emergency Medicine | Admitting: Emergency Medicine

## 2014-07-07 DIAGNOSIS — Z9861 Coronary angioplasty status: Secondary | ICD-10-CM | POA: Insufficient documentation

## 2014-07-07 DIAGNOSIS — Z7982 Long term (current) use of aspirin: Secondary | ICD-10-CM | POA: Diagnosis not present

## 2014-07-07 DIAGNOSIS — Z8719 Personal history of other diseases of the digestive system: Secondary | ICD-10-CM | POA: Diagnosis not present

## 2014-07-07 DIAGNOSIS — I252 Old myocardial infarction: Secondary | ICD-10-CM | POA: Diagnosis not present

## 2014-07-07 DIAGNOSIS — Z87442 Personal history of urinary calculi: Secondary | ICD-10-CM | POA: Insufficient documentation

## 2014-07-07 DIAGNOSIS — M5416 Radiculopathy, lumbar region: Secondary | ICD-10-CM

## 2014-07-07 DIAGNOSIS — Z794 Long term (current) use of insulin: Secondary | ICD-10-CM | POA: Diagnosis not present

## 2014-07-07 DIAGNOSIS — I10 Essential (primary) hypertension: Secondary | ICD-10-CM | POA: Insufficient documentation

## 2014-07-07 DIAGNOSIS — G8929 Other chronic pain: Secondary | ICD-10-CM | POA: Diagnosis not present

## 2014-07-07 DIAGNOSIS — E119 Type 2 diabetes mellitus without complications: Secondary | ICD-10-CM | POA: Insufficient documentation

## 2014-07-07 DIAGNOSIS — M79602 Pain in left arm: Secondary | ICD-10-CM

## 2014-07-07 DIAGNOSIS — M79605 Pain in left leg: Secondary | ICD-10-CM

## 2014-07-07 DIAGNOSIS — F17200 Nicotine dependence, unspecified, uncomplicated: Secondary | ICD-10-CM | POA: Diagnosis not present

## 2014-07-07 DIAGNOSIS — E782 Mixed hyperlipidemia: Secondary | ICD-10-CM | POA: Diagnosis not present

## 2014-07-07 DIAGNOSIS — M79609 Pain in unspecified limb: Secondary | ICD-10-CM | POA: Diagnosis not present

## 2014-07-07 DIAGNOSIS — Z79899 Other long term (current) drug therapy: Secondary | ICD-10-CM | POA: Diagnosis not present

## 2014-07-07 DIAGNOSIS — M549 Dorsalgia, unspecified: Secondary | ICD-10-CM | POA: Diagnosis present

## 2014-07-07 DIAGNOSIS — Z7902 Long term (current) use of antithrombotics/antiplatelets: Secondary | ICD-10-CM | POA: Insufficient documentation

## 2014-07-07 DIAGNOSIS — IMO0002 Reserved for concepts with insufficient information to code with codable children: Secondary | ICD-10-CM | POA: Insufficient documentation

## 2014-07-07 DIAGNOSIS — I2510 Atherosclerotic heart disease of native coronary artery without angina pectoris: Secondary | ICD-10-CM | POA: Diagnosis not present

## 2014-07-07 MED ORDER — CYCLOBENZAPRINE HCL 10 MG PO TABS: 10.0000 mg | ORAL_TABLET | Freq: Three times a day (TID) | ORAL | Status: DC | PRN

## 2014-07-07 MED ORDER — OXYCODONE-ACETAMINOPHEN 5-325 MG PO TABS: 1.0000 | ORAL_TABLET | ORAL | Status: DC | PRN

## 2014-07-07 MED ORDER — OXYCODONE-ACETAMINOPHEN 5-325 MG PO TABS
1.0000 | ORAL_TABLET | Freq: Once | ORAL | Status: AC
  Administered 2014-07-07: 1 via ORAL
  Filled 2014-07-07: qty 1

## 2014-07-07 MED ORDER — CYCLOBENZAPRINE HCL 10 MG PO TABS
10.0000 mg | ORAL_TABLET | Freq: Once | ORAL | Status: AC
  Administered 2014-07-07: 10 mg via ORAL
  Filled 2014-07-07: qty 1

## 2014-07-07 NOTE — ED Notes (Signed)
Low back pain  Chronic, but worse for last few days.No known injury

## 2014-07-07 NOTE — Discharge Instructions (Signed)
Chronic Back Pain  When back pain lasts longer than 3 months, it is called chronic back pain.People with chronic back pain often go through certain periods that are more intense (flare-ups).  CAUSES Chronic back pain can be caused by wear and tear (degeneration) on different structures in your back. These structures include:  The bones of your spine (vertebrae) and the joints surrounding your spinal cord and nerve roots (facets).  The strong, fibrous tissues that connect your vertebrae (ligaments). Degeneration of these structures may result in pressure on your nerves. This can lead to constant pain. HOME CARE INSTRUCTIONS  Avoid bending, heavy lifting, prolonged sitting, and activities which make the problem worse.  Take brief periods of rest throughout the day to reduce your pain. Lying down or standing usually is better than sitting while you are resting.  Take over-the-counter or prescription medicines only as directed by your caregiver. SEEK IMMEDIATE MEDICAL CARE IF:   You have weakness or numbness in one of your legs or feet.  You have trouble controlling your bladder or bowels.  You have nausea, vomiting, abdominal pain, shortness of breath, or fainting. Document Released: 12/28/2004 Document Revised: 02/12/2012 Document Reviewed: 11/04/2011 [facility] Patient Information 2015 [place], [place]. This information is not intended to replace advice given to you by your health care provider. Make sure you discuss any questions you have with your health care provider.  Lumbosacral Radiculopathy Lumbosacral radiculopathy is a pinched nerve or nerves in the low back (lumbosacral area). When this happens you may have weakness in your legs and may not be able to stand on your toes. You may have pain going down into your legs. There may be difficulties with walking normally. There are many causes of this problem. Sometimes this may happen from an injury, or simply from arthritis or boney  problems. It may also be caused by other illnesses such as diabetes. If there is no improvement after treatment, further studies may be done to find the exact cause. DIAGNOSIS  X-rays may be needed if the problems become long standing. Electromyograms may be done. This study is one in which the working of nerves and muscles is studied. HOME CARE INSTRUCTIONS   Applications of ice packs may be helpful. Ice can be used in a plastic bag with a towel around it to prevent frostbite to skin. This may be used every 2 hours for 20 to 30 minutes, or as needed, while awake, or as directed by your caregiver.  Only take over-the-counter or prescription medicines for pain, discomfort, or fever as directed by your caregiver.  If physical therapy was prescribed, follow your caregiver's directions. SEEK IMMEDIATE MEDICAL CARE IF:   You have pain not controlled with medications.  You seem to be getting worse rather than better.  You develop increasing weakness in your legs.  You develop loss of bowel or bladder control.  You have difficulty with walking or balance, or develop clumsiness in the use of your legs.  You have a fever. MAKE SURE YOU:   Understand these instructions.  Will watch your condition.  Will get help right away if you are not doing well or get worse. Document Released: 11/20/2005 Document Revised: 02/12/2012 Document Reviewed: 07/10/2008 [facility] Patient Information 2015 [place], [place]. This information is not intended to replace advice given to you by your health care provider. Make sure you discuss any questions you have with your health care provider.

## 2014-07-07 NOTE — ED Provider Notes (Signed)
Medical screening examination/treatment/procedure(s) were performed by non-physician practitioner and as supervising physician I was immediately available for consultation/collaboration.   EKG Interpretation None        Benny Lennert, MD 07/07/14 [phone]

## 2014-07-07 NOTE — ED Provider Notes (Signed)
CSN: [id number]     Arrival date & time 07/07/14  1205 History   None    Chief Complaint  Patient presents with  . Back Pain   Ryan [name] is a 48 y.o. male who presents to the Emergency Department complaining of left sided low back pain.  Pt reports hx chronic back and left leg pain, but reports pain has increased for several days.  Pain radiates to the left knee.  He reports left LE numbness which is similar to previous since having bilateral femoral artery bypass last year.  Has been in pain management for this in the past but was has been discharged.   (Consider location/radiation/quality/duration/timing/severity/associated sxs/prior Treatment) Patient is a 48 y.o. male presenting with back pain. The history is provided by the patient.  Back Pain Location:  Lumbar spine Quality:  Shooting (sharp) Radiates to:  L thigh, L knee and L posterior upper leg Pain is:  Same all the time Onset quality:  Gradual Duration:  3 days Timing:  Constant Progression:  Unchanged Chronicity:  Recurrent Context: twisting   Context comment:  Possible injury from lifting, but pt is unsure Relieved by:  Bed rest Worsened by:  Bending, twisting, standing and sitting Ineffective treatments:  None tried Associated symptoms: leg pain   Associated symptoms: no abdominal pain, no abdominal swelling, no bladder incontinence, no bowel incontinence, no chest pain, no dysuria, no fever, no headaches, no numbness, no paresthesias, no pelvic pain, no perianal numbness, no tingling and no weakness     Past Medical History  Diagnosis Date  . Anal fissure   . Rectal polyp   . Mixed hyperlipidemia     takes Simvastatin daily  . PAD (peripheral artery disease)   . GERD (gastroesophageal reflux disease)     "takes tums"  . Hypertension     takes Enalapril and Coreg daily   . Coronary atherosclerosis of native coronary artery     takes Effient but on hold for surgery  . MI (myocardial infarction)     AMI 1/13  - complicated by VT/Tosades  . Bruises easily     d/t being on Effient  . History of kidney stones   . Diabetes mellitus without complication     takes Lantus daily  . History of blood transfusion     no abnormal reaction noted   Past Surgical History  Procedure Laterality Date  . Appendectomy    . Diagnostic laparoscopy    . Anal fissurectomy    . Coronary angioplasty      stent placed Dec 12, 2011  . Aorta - bilateral femoral artery bypass graft N/A 01/16/2013    Procedure: AORTA BIFEMORAL BYPASS GRAFT;  Surgeon: Nada Libman, MD;  Location: [facility] OR;  Service: Vascular;  Laterality: N/A;  . Colonoscopy    . Carotid-subclavian bypass graft Left 04/03/2013    Procedure: BYPASS GRAFT CAROTID-SUBCLAVIAN;  Surgeon: Nada Libman, MD;  Location: [facility] OR;  Service: Vascular;  Laterality: Left;  . I&d extremity Left 04/18/2013    Procedure: debridement of left neck lymphocele;  Surgeon: Nada Libman, MD;  Location: [facility] OR;  Service: Vascular;  Laterality: Left;  I and D of left neck   Family History  Problem Relation Age of Onset  . Cancer Father     Lung  . Cancer Maternal Uncle     Colon  . Diabetes Mother   . COPD Mother    History  Substance Use Topics  . Smoking  status: Current Every Day Smoker -- 0.25 packs/day for 25 years    Types: Cigarettes  . Smokeless tobacco: Never Used  . Alcohol Use: 0.0 oz/week     Comment: occasional     Review of Systems  Constitutional: Negative for fever.  Respiratory: Negative for shortness of breath.   Cardiovascular: Negative for chest pain.  Gastrointestinal: Negative for vomiting, abdominal pain, constipation and bowel incontinence.  Genitourinary: Negative for bladder incontinence, dysuria, hematuria, flank pain, decreased urine volume, difficulty urinating and pelvic pain.       No perineal numbness or incontinence of urine or feces  Musculoskeletal: Positive for back pain. Negative for joint swelling.  Skin: Negative for rash.   Neurological: Negative for tingling, weakness, numbness, headaches and paresthesias.  All other systems reviewed and are negative.     Allergies  Hydrocodone  Home Medications   Prior to Admission medications   Medication Sig Start Date End Date Taking? Authorizing Provider  aspirin 81 MG tablet Take 162 mg by mouth daily.     Historical Provider, MD  carvedilol (COREG) 3.125 MG tablet Take 3.125 mg by mouth 2 (two) times daily with a meal. 08/07/13   Kathleene Hazelhristopher D McAlhany, MD  clopidogrel (PLAVIX) 75 MG tablet Take 1 tablet (75 mg total) by mouth daily. 08/07/13 08/07/14  Kathleene Hazelhristopher D McAlhany, MD  enalapril (VASOTEC) 5 MG tablet Take 5 mg by mouth 2 (two) times daily.    Historical Provider, MD  insulin glargine (LANTUS) 100 UNIT/ML injection Inject 35 Units into the skin at bedtime.  02/19/13   Leroy SeaPrashant K Singh, MD  isosorbide mononitrate (IMDUR) 30 MG 24 hr tablet Take 1 tablet (30 mg total) by mouth daily. 08/07/13 08/07/14  Kathleene Hazelhristopher D McAlhany, MD  nitroGLYCERIN (NITROSTAT) 0.4 MG SL tablet Place 1 tablet (0.4 mg total) under the tongue every 5 (five) minutes as needed for chest pain. 03/26/14   Kathleene Hazelhristopher D McAlhany, MD  oxyCODONE-acetaminophen (PERCOCET) 5-325 MG per tablet Take 1 tablet by mouth every 4 (four) hours as needed for severe pain. 03/29/14   Flint MelterElliott L Wentz, MD  simvastatin (ZOCOR) 40 MG tablet Take 40 mg by mouth at bedtime.    Historical Provider, MD  spironolactone (ALDACTONE) 25 MG tablet Take 12.5 mg by mouth daily.    Historical Provider, MD   BP 124/82  Pulse 102  Temp(Src) 98.3 F (36.8 C) (Oral)  Resp 16  Ht 5\' 9"  (1.753 m)  Wt 200 lb (90.719 kg)  BMI 29.52 kg/m2  SpO2 100% Physical Exam  Nursing note and vitals reviewed. Constitutional: He is oriented to person, place, and time. He appears well-developed and well-nourished. No distress.  HENT:  Head: Normocephalic and atraumatic.  Neck: Normal range of motion. Neck supple.  Cardiovascular: Normal rate,  regular rhythm, normal heart sounds and intact distal pulses.   No murmur heard. Pulmonary/Chest: Effort normal and breath sounds normal. No respiratory distress.  Abdominal: Soft. He exhibits no distension. There is no tenderness. There is no rebound and no guarding.  Musculoskeletal: He exhibits tenderness. He exhibits no edema.       Lumbar back: He exhibits tenderness and pain. He exhibits normal range of motion, no swelling, no deformity, no laceration and normal pulse.  ttp of the left lumbar paraspinal muscles and SI joint.  No spinal tenderness.  DP pulses are brisk and symmetrical.  Distal sensation intact.  No LE edema or discoloration.  Hip Flexors/Extensors are intact.  Pt has 5/5 strength against resistance of  bilateral lower extremities.     Neurological: He is alert and oriented to person, place, and time. He has normal strength. No sensory deficit. He exhibits normal muscle tone. Coordination and gait normal.  Reflex Scores:      Patellar reflexes are 2+ on the right side and 2+ on the left side.      Achilles reflexes are 2+ on the right side and 2+ on the left side. Skin: Skin is warm and dry. No rash noted.    ED Course  Procedures (including critical care time) Labs Review Labs Reviewed - No data to display  Imaging Review No results found.   EKG Interpretation None      MDM   Final diagnoses:  Lumbar radicular pain  Chronic leg pain, left    Patient reviewed on the [place] narcotic database.  No recent narcotics on file.  Likely sciatica.  Pt has appt with his PMD next week.  No concerning sx's for emergent neurological or infectious process.  Ambulates with a steady gait.  LLE numbness reported by pt is chronic and documented on previous ED visit in April of last year.  LE's appear well perfused.  Agrees to close f/u with his PMD or to return here if sx's worsen.  Pt is feeling better after medications and appears stable for d/c  Zaydyn Havey L. Markos Theil, PA-C 07/07/14  1302

## 2014-07-16 ENCOUNTER — Encounter (HOSPITAL_COMMUNITY): Payer: Self-pay | Admitting: Emergency Medicine

## 2014-07-16 ENCOUNTER — Emergency Department (HOSPITAL_COMMUNITY)
Admission: EM | Admit: 2014-07-16 | Discharge: 2014-07-16 | Disposition: A | Discharge status: Home / Self Care | Payer: Medicaid Other | Attending: Emergency Medicine | Admitting: Emergency Medicine

## 2014-07-16 DIAGNOSIS — Z8719 Personal history of other diseases of the digestive system: Secondary | ICD-10-CM | POA: Diagnosis not present

## 2014-07-16 DIAGNOSIS — G8929 Other chronic pain: Secondary | ICD-10-CM | POA: Diagnosis not present

## 2014-07-16 DIAGNOSIS — Z7902 Long term (current) use of antithrombotics/antiplatelets: Secondary | ICD-10-CM | POA: Diagnosis not present

## 2014-07-16 DIAGNOSIS — I2510 Atherosclerotic heart disease of native coronary artery without angina pectoris: Secondary | ICD-10-CM | POA: Insufficient documentation

## 2014-07-16 DIAGNOSIS — Z7982 Long term (current) use of aspirin: Secondary | ICD-10-CM | POA: Insufficient documentation

## 2014-07-16 DIAGNOSIS — Z9861 Coronary angioplasty status: Secondary | ICD-10-CM | POA: Insufficient documentation

## 2014-07-16 DIAGNOSIS — Z79899 Other long term (current) drug therapy: Secondary | ICD-10-CM | POA: Insufficient documentation

## 2014-07-16 DIAGNOSIS — M545 Low back pain, unspecified: Secondary | ICD-10-CM | POA: Insufficient documentation

## 2014-07-16 DIAGNOSIS — F17200 Nicotine dependence, unspecified, uncomplicated: Secondary | ICD-10-CM | POA: Diagnosis not present

## 2014-07-16 DIAGNOSIS — Z794 Long term (current) use of insulin: Secondary | ICD-10-CM | POA: Diagnosis not present

## 2014-07-16 DIAGNOSIS — M549 Dorsalgia, unspecified: Secondary | ICD-10-CM | POA: Insufficient documentation

## 2014-07-16 DIAGNOSIS — E782 Mixed hyperlipidemia: Secondary | ICD-10-CM | POA: Diagnosis not present

## 2014-07-16 DIAGNOSIS — I252 Old myocardial infarction: Secondary | ICD-10-CM | POA: Insufficient documentation

## 2014-07-16 DIAGNOSIS — E119 Type 2 diabetes mellitus without complications: Secondary | ICD-10-CM | POA: Diagnosis not present

## 2014-07-16 DIAGNOSIS — Z87442 Personal history of urinary calculi: Secondary | ICD-10-CM | POA: Diagnosis not present

## 2014-07-16 DIAGNOSIS — I10 Essential (primary) hypertension: Secondary | ICD-10-CM | POA: Insufficient documentation

## 2014-07-16 MED ORDER — ONDANSETRON HCL 4 MG PO TABS
4.0000 mg | ORAL_TABLET | Freq: Once | ORAL | Status: AC
  Administered 2014-07-16: 4 mg via ORAL
  Filled 2014-07-16: qty 1

## 2014-07-16 MED ORDER — CYCLOBENZAPRINE HCL 10 MG PO TABS
10.0000 mg | ORAL_TABLET | Freq: Once | ORAL | Status: AC
  Administered 2014-07-16: 10 mg via ORAL
  Filled 2014-07-16: qty 1

## 2014-07-16 MED ORDER — ACETAMINOPHEN-CODEINE #3 300-30 MG PO TABS
2.0000 | ORAL_TABLET | Freq: Once | ORAL | Status: DC
  Filled 2014-07-16: qty 2

## 2014-07-16 MED ORDER — OXYCODONE-ACETAMINOPHEN 5-325 MG PO TABS
1.0000 | ORAL_TABLET | Freq: Once | ORAL | Status: AC
  Administered 2014-07-16: 1 via ORAL
  Filled 2014-07-16: qty 1

## 2014-07-16 NOTE — ED Provider Notes (Signed)
CSN: [id number]     Arrival date & time 07/16/14  [id number] History   First MD Initiated Contact with Patient 07/16/14 1045     Chief Complaint  Patient presents with  . Back Pain     (Consider location/radiation/quality/duration/timing/severity/associated sxs/prior Treatment) HPI Comments: The patient is a 48 year old male home who presents to the emergency department with a complaint of back pain. Patient has a history of chronic back pain. He had states that he had major vascular surgery done several years ago and since that time he's been having some" nerve damage" involving his lower legs. The patient states he has back problems from time to time, but recently in the last few weeks it seems as though the pain is getting somewhat worse. The patient was evaluated here in the emergency department last week no acute changes were found. The patient was [facility]. The patient states that he saw his primary doctor, was given pain medication, but has run out. He states he is here in the emergency department because he would like to have additional pain medication.   Patient is a 48 y.o. male presenting with back pain. The history is provided by the patient.  Back Pain Associated symptoms: no abdominal pain, no chest pain and no dysuria     Past Medical History  Diagnosis Date  . Anal fissure   . Rectal polyp   . Mixed hyperlipidemia     takes Simvastatin daily  . PAD (peripheral artery disease)   . GERD (gastroesophageal reflux disease)     "takes tums"  . Hypertension     takes Enalapril and Coreg daily   . Coronary atherosclerosis of native coronary artery     takes Effient but on hold for surgery  . MI (myocardial infarction)     AMI 1/13 - complicated by VT/Tosades  . Bruises easily     d/t being on Effient  . History of kidney stones   . Diabetes mellitus without complication     takes Lantus daily  . History of blood  transfusion     no abnormal reaction noted   Past Surgical History  Procedure Laterality Date  . Appendectomy    . Diagnostic laparoscopy    . Anal fissurectomy    . Coronary angioplasty      stent placed Dec 12, 2011  . Aorta - bilateral femoral artery bypass graft N/A 01/16/2013    Procedure: AORTA BIFEMORAL BYPASS GRAFT;  Surgeon: Nada LibmanVance W Brabham, MD;  Location: [facility] OR;  Service: Vascular;  Laterality: N/A;  . Colonoscopy    . Carotid-subclavian bypass graft Left 04/03/2013    Procedure: BYPASS GRAFT CAROTID-SUBCLAVIAN;  Surgeon: Nada LibmanVance W Brabham, MD;  [facility];  Service: Vascular;  Laterality: Left;  . I&d extremity Left 04/18/2013    Procedure: debridement of left neck lymphocele;  Surgeon: Nada LibmanVance W Brabham, MD;  [facility];  Service: Vascular;  Laterality: Left;  I and D of left neck   Family History  Problem Relation Age of Onset  . Cancer Father     Lung  . Cancer Maternal Uncle     Colon  . Diabetes Mother   . COPD Mother    History  Substance Use Topics  . Smoking status: Current Every Day Smoker -- 0.25 packs/day for 25 years    Types: Cigarettes  . Smokeless tobacco: Never Used  . Alcohol Use: 0.0 oz/week  Comment: occasional     Review of Systems  Constitutional: Negative for activity change.       All ROS Neg except as noted in HPI  HENT: Negative for nosebleeds.   Eyes: Negative for photophobia and discharge.  Respiratory: Negative for cough, shortness of breath and wheezing.   Cardiovascular: Negative for chest pain and palpitations.  Gastrointestinal: Negative for abdominal pain and blood in stool.  Genitourinary: Negative for dysuria, frequency and hematuria.  Musculoskeletal: Positive for back pain. Negative for arthralgias and neck pain.  Skin: Negative.   Neurological: Negative for dizziness, seizures and speech difficulty.  Hematological: Bruises/bleeds easily.  Psychiatric/Behavioral: Negative for hallucinations and confusion.       Allergies  Hydrocodone  Home Medications   Prior to Admission medications   Medication Sig Start Date End Date Taking? Authorizing Provider  aspirin EC 81 MG tablet Take 162 mg by mouth daily.   Yes Historical Provider, MD  carvedilol (COREG) 3.125 MG tablet Take 3.125 mg by mouth 2 (two) times daily with a meal. 08/07/13  Yes Kathleene Hazel, MD  clopidogrel (PLAVIX) 75 MG tablet Take 1 tablet (75 mg total) by mouth daily. 08/07/13 08/07/14 Yes Kathleene Hazel, MD  cyclobenzaprine (FLEXERIL) 10 MG tablet Take 10 mg by mouth 3 (three) times daily as needed for muscle spasms.   Yes Historical Provider, MD  enalapril (VASOTEC) 5 MG tablet Take 5 mg by mouth 2 (two) times daily.   Yes Historical Provider, MD  Insulin Glargine (LANTUS SOLOSTAR) 100 UNIT/ML Solostar Pen Inject 35 Units into the skin at bedtime.   Yes Historical Provider, MD  isosorbide mononitrate (IMDUR) 30 MG 24 hr tablet Take 1 tablet (30 mg total) by mouth daily. 08/07/13 08/07/14 Yes Kathleene Hazel, MD  simvastatin (ZOCOR) 40 MG tablet Take 40 mg by mouth at bedtime.   Yes Historical Provider, MD  spironolactone (ALDACTONE) 25 MG tablet Take 12.5 mg by mouth daily.   Yes Historical Provider, MD  nitroGLYCERIN (NITROSTAT) 0.4 MG SL tablet Place 1 tablet (0.4 mg total) under the tongue every 5 (five) minutes as needed for chest pain. 03/26/14   Kathleene Hazel, MD   BP 158/103  Pulse 105  Temp(Src) 98.5 F (36.9 C) (Oral)  Resp 18  Ht 5\' 9"  (1.753 m)  Wt 201 lb (91.173 kg)  BMI 29.67 kg/m2  SpO2 100% Physical Exam  Nursing note and vitals reviewed. Constitutional: He is oriented to person, place, and time. He appears well-developed and well-nourished.  Non-toxic appearance.  HENT:  Head: Normocephalic.  Right Ear: Tympanic membrane and external ear normal.  Left Ear: Tympanic membrane and external ear normal.  Eyes: EOM and lids are normal. Pupils are equal, round, and reactive to light.   Neck: Normal range of motion. Neck supple. Carotid bruit is not present.  Cardiovascular: Normal rate, regular rhythm, normal heart sounds, intact distal pulses and normal pulses.   Pulmonary/Chest: Breath sounds normal. No respiratory distress.  Abdominal: Soft. Bowel sounds are normal. There is no tenderness. There is no guarding.  Musculoskeletal:       Lumbar back: He exhibits decreased range of motion, tenderness and pain.       Back:  Lymphadenopathy:       Head (right side): No submandibular adenopathy present.       Head (left side): No submandibular adenopathy present.    He has no cervical adenopathy.  Neurological: He is alert and oriented to person, place, and time. He  has normal strength. No cranial nerve deficit or sensory deficit.  Gait is steady but slow. No evidence of foot drop.  Skin: Skin is warm and dry.  Psychiatric: He has a normal mood and affect. His speech is normal.    ED Course  Procedures (including critical care time) Labs Review Labs Reviewed - No data to display  Imaging Review No results found.   EKG Interpretation None      MDM  Patient has history of chronic back pain. He was recently seen in the emergency department for this problem.  I spoke with Dr. Barbette Hair office. The patient was seen on August 7. The patient received 30 tablets of pain medication at that time. The patient has been referred to the pain management Center in [facility] clinic). The consultant has not contacted her primary physician with a appointment date at this point.  I've advised the patient to see his primary physician for pain management. I discussed with the patient that the emergency department is happy to assist with any and all emergent situations, but we are not set up for chronic pain issues. Patient knowledge is understanding of these discharge instructions.    Final diagnoses:  None    **I have reviewed nursing notes, vital signs, and all  appropriate lab and imaging results for this patient.Kathie Dike, PA-C 07/16/14 1149

## 2014-07-16 NOTE — ED Notes (Signed)
Pt reports lower left back pain that radiates down left leg for last 2 weeks.  Pt has hx of artificial arteries that were put in after MI in 2013 in lower abdomen and run into legs.

## 2014-07-16 NOTE — ED Notes (Signed)
Pt states left lower back pain with pain radiating down left leg. States pain has gotten worse over past couple of weeks.

## 2014-07-16 NOTE — Discharge Instructions (Signed)
Your vital signs are stable. No gross neurologic deficit appreciated on today's examination. Please see Dr.Avbuere in [facility] for management of your pain until you're seen by the pain management specialist. You were treated in the emergency department with a muscle relaxer and narcotic pain medication. Please use caution getting around. Chronic Pain Chronic pain can be defined as pain that is off and on and lasts for 3-6 months or longer. Many things cause chronic pain, which can make it difficult to make a diagnosis. There are many treatment options available for chronic pain. However, finding a treatment that works well for you may require trying various approaches until the right one is found. Many people benefit from a combination of two or more types of treatment to control their pain. SYMPTOMS  Chronic pain can occur anywhere in the body and can range from mild to very severe. Some types of chronic pain include:  Headache.  Low back pain.  Cancer pain.  Arthritis pain.  Neurogenic pain. This is pain resulting from damage to nerves. People with chronic pain may also have other symptoms such as:  Depression.  Anger.  Insomnia.  Anxiety. DIAGNOSIS  Your health care provider will help diagnose your condition over time. In many cases, the initial focus will be on excluding possible conditions that could be causing the pain. Depending on your symptoms, your health care provider may order tests to diagnose your condition. Some of these tests may include:   Blood tests.   CT scan.   MRI.   X-rays.   Ultrasounds.   Nerve conduction studies.  You may need to see a specialist.  TREATMENT  Finding treatment that works well may take time. You may be referred to a pain specialist. He or she may prescribe medicine or therapies, such as:   Mindful meditation or yoga.  Shots (injections) of numbing or pain-relieving medicines into the spine or area of pain.  Local  electrical stimulation.  Acupuncture.   Massage therapy.   Aroma, color, light, or sound therapy.   Biofeedback.   Working with a physical therapist to keep from getting stiff.   Regular, gentle exercise.   Cognitive or behavioral therapy.   Group support.  Sometimes, surgery may be recommended.  HOME CARE INSTRUCTIONS   Take all medicines as directed by your health care provider.   Lessen stress in your life by relaxing and doing things such as listening to calming music.   Exercise or be active as directed by your health care provider.   Eat a healthy diet and include things such as vegetables, fruits, fish, and lean meats in your diet.   Keep all follow-up appointments with your health care provider.   Attend a support group with others suffering from chronic pain. SEEK MEDICAL CARE IF:   Your pain gets worse.   You develop a new pain that was not there before.   You cannot tolerate medicines given to you by your health care provider.   You have new symptoms since your last visit with your health care provider.  SEEK IMMEDIATE MEDICAL CARE IF:   You feel weak.   You have decreased sensation or numbness.   You lose control of bowel or bladder function.   Your pain suddenly gets much worse.   You develop shaking.  You develop chills.  You develop confusion.  You develop chest pain.  You develop shortness of breath.  MAKE SURE YOU:  Understand these instructions.  Will watch your condition.  Will get help right away if you are not doing well or get worse. Document Released: 08/12/2002 Document Revised: 07/23/2013 Document Reviewed: 05/16/2013 [facility] Patient Information 2015 [place], [place]. This information is not intended to replace advice given to you by your health care provider. Make sure you discuss any questions you have with your health care provider.

## 2014-07-16 NOTE — ED Provider Notes (Signed)
Medical screening examination/treatment/procedure(s) were performed by non-physician practitioner and as supervising physician I was immediately available for consultation/collaboration.   EKG Interpretation None       Donnetta Hutching, MD 07/16/14 1528

## 2014-08-24 ENCOUNTER — Telehealth: Payer: Self-pay | Admitting: *Deleted

## 2014-08-24 DIAGNOSIS — E785 Hyperlipidemia, unspecified: Secondary | ICD-10-CM

## 2014-08-24 NOTE — Telephone Encounter (Signed)
Received lab results from research study pt in. LDL has gone up to 170. Was previously 143. Per Dr. [name] pt should stop Zocor and change to Atorvastatin 80 mg by mouth daily. Check lipid and liver profiles 12 weeks after change.  If he has not been taking Zocor he should resume and check lab work in 12 weeks. I placed call to pt to give him this information. Left message to call back.

## 2014-08-26 ENCOUNTER — Telehealth: Payer: Self-pay | Admitting: *Deleted

## 2014-08-26 NOTE — Telephone Encounter (Signed)
thanks

## 2014-08-26 NOTE — Telephone Encounter (Signed)
I called patient to let him know he had an abnormal lab that needed to be repeated from research labs drawn on 08/18/14. I asked patient to call me and schedule lab re-draw.

## 2014-08-27 ENCOUNTER — Telehealth: Payer: Self-pay | Admitting: *Deleted

## 2014-08-27 NOTE — Telephone Encounter (Signed)
Thanks. cdm 

## 2014-08-27 NOTE — Telephone Encounter (Signed)
I spoke to patient about GGT at 102U/L. He will come in on 08/28/14 at 9 am and we will redraw labs. I asked patient if he was taking Simvastatin and he said he had been out of medicine . His LDL was 170. I asked him to please start Simvastatin again. He agreed to do that and we can recheck his lipid in 12 weeks. Patient will follow-up with primary doctor for elevated blood sugar.I will give him copy of labs to take to primary doctor's office.

## 2014-08-31 NOTE — Telephone Encounter (Signed)
Research has spoken with pt and he has not been taking Simvastatin. He was instructed to resume and have fasting labs checked in 12 weeks. I placed call to pt to schedule lab appt. Left message to call back

## 2014-09-02 ENCOUNTER — Other Ambulatory Visit: Payer: Self-pay | Admitting: Cardiovascular Disease

## 2014-09-07 ENCOUNTER — Encounter: Payer: Self-pay | Admitting: Family

## 2014-09-07 NOTE — Telephone Encounter (Signed)
Spoke with pt (new phone number [phone]) and arranged fasting blood work for November 17, 2014.

## 2014-09-08 ENCOUNTER — Other Ambulatory Visit (HOSPITAL_COMMUNITY): Payer: Self-pay

## 2014-09-08 ENCOUNTER — Encounter (HOSPITAL_COMMUNITY): Payer: Self-pay

## 2014-09-08 ENCOUNTER — Ambulatory Visit: Payer: Self-pay | Admitting: Family

## 2014-09-08 ENCOUNTER — Telehealth: Payer: Self-pay | Admitting: *Deleted

## 2014-09-08 NOTE — Telephone Encounter (Signed)
Done in error.

## 2014-09-08 NOTE — Telephone Encounter (Signed)
Message copied by Maury Dus on Tue Sep 08, 2014  4:59 PM ------      Message from: Barbaraann Share      Created: Tue Sep 08, 2014  3:35 PM       I do not follow this patient.            ----- Message -----         From: Ennis Forts, RN         Sent: 09/08/2014  11:21 AM           To: Barbaraann Share, MD            Dr, Shelle Iron,  I follow your patient Ryan [name] in [facility]. I have scanned labs into Epic. Dr. Riley Kill asked you be notified of patient's glucose at 193.       ------

## 2014-09-10 ENCOUNTER — Encounter: Payer: Self-pay | Admitting: Family

## 2014-09-11 ENCOUNTER — Ambulatory Visit: Payer: Medicaid Other | Admitting: Family

## 2014-09-11 ENCOUNTER — Other Ambulatory Visit (HOSPITAL_COMMUNITY): Payer: Medicaid Other

## 2014-09-11 ENCOUNTER — Encounter (HOSPITAL_COMMUNITY): Payer: Medicaid Other

## 2014-10-22 ENCOUNTER — Ambulatory Visit: Payer: Medicaid Other | Admitting: Cardiovascular Disease

## 2014-10-22 ENCOUNTER — Telehealth: Payer: Self-pay | Admitting: Cardiovascular Disease

## 2014-10-22 NOTE — Telephone Encounter (Signed)
Pt did not show for appt today. cdm

## 2014-11-01 ENCOUNTER — Other Ambulatory Visit: Payer: Self-pay | Admitting: Cardiovascular Disease

## 2014-11-12 ENCOUNTER — Telehealth: Payer: Self-pay | Admitting: Cardiovascular Disease

## 2014-11-12 ENCOUNTER — Encounter (HOSPITAL_COMMUNITY): Payer: Self-pay | Admitting: Cardiovascular Disease

## 2014-11-12 NOTE — Telephone Encounter (Signed)
Request for surgical clearance from Dr. Amanda Pea received in office.  Pt will need office visit prior to being cleared. Message has been left for pt to call back

## 2014-11-12 NOTE — Telephone Encounter (Signed)
New Msg    Pt states [facility] needs to know when he can stop Coumadin because he is going to have surgery on hand. Dr. Dionne Ano. Amanda Pea, MD will be performing surgery. Please contact pt at [phone] if need to, pt states daughter may answer but its ok to leave msg with her.

## 2014-11-12 NOTE — Telephone Encounter (Signed)
Left message for pt to call back  °

## 2014-11-13 NOTE — Telephone Encounter (Signed)
Mobile number listed for pt is incorrect. Placed another call to number listed below ([phone]) and left message to call office.

## 2014-11-16 NOTE — Telephone Encounter (Signed)
Reviewed with Dr. [name] and pt will need office visit prior to being cleared. I spoke with Cordelia Pen at Dr. Carlos Levering office and gave her this information.  Appt will need to be arranged through [place] at [place] jail.  Phone number is [phone]. I spoke with Tollie Eth at [facility] jail. Pt is being transferred to [facility] jail tonight or tomorrow morning. Appt cannot be made prior to transfer. Kathie Rhodes will put note in pt's personal belongings to call our office to schedule appt.  I placed call to [facility] at Dr. Carlos Levering office to give her this information and left message to call back

## 2014-11-16 NOTE — Telephone Encounter (Signed)
Follow up Ryan [name] with Dr. Carlos Levering office calling asking not to call daughter, pt in jail and we cannot give any dates out to him or his family, surgery was rescheduled to 11-21-14 Saturday, but they still need the surgical clearance, pls call [phone]

## 2014-11-16 NOTE — Telephone Encounter (Signed)
Spoke with Cordelia Pen at Dr. Carlos Levering office. Pt is currently in [facility] and surgery has been rescheduled to November 21, 2014. Pt will be taken to hospital for surgery by sheriff's department and then taken back to detention center.  Pt has told Dr.Gramig he is taking Coumadin. Office is requesting surgical clearance and also to stop Coumadin prior to procedure.  Cordelia Pen states pt has laceration and unless surgery is done soon he could lose movement.  I told Cordelia Pen our records indicate pt is on Clopidogrel and ASA but he is not on Coumadin.  I also told her Dr. [name] wanted pt to have office visit prior to being cleared.  As this is an urgent situation Dr. Amanda Pea will contact Dr. [name] to discuss.  I gave [facility] Dr. Gibson Ramp pager number.

## 2014-11-16 NOTE — Telephone Encounter (Signed)
Follow up     Talk to the nurse.  Pt is due to have surgery tomorrow.  Clearance is needed ASAP.  Nurse has been talking to pts daughter--Macey at [phone].  Please call nurse ASAP regarding clearance.  Pt needs laceration to tenden repaired ASAP.

## 2014-11-16 NOTE — Telephone Encounter (Signed)
If he needs clearance, could we add him on to the flex schedule? Ryan [name]

## 2014-11-17 ENCOUNTER — Other Ambulatory Visit: Payer: Medicaid Other

## 2014-11-17 NOTE — Telephone Encounter (Signed)
New Message      Ryan [name] from Dr Carlos Levering office called and she was aware that Ryan [name] was not here and to not expect her call (Ryan [name]'s call) until tomorrow 11/18/14. TM

## 2014-11-18 ENCOUNTER — Encounter: Payer: Self-pay | Admitting: Physician Assistant

## 2014-11-18 ENCOUNTER — Ambulatory Visit (INDEPENDENT_AMBULATORY_CARE_PROVIDER_SITE_OTHER): Payer: Medicaid Other | Admitting: Physician Assistant

## 2014-11-18 VITALS — BP 122/62 | HR 72 | Ht 68.5 in | Wt 195.0 lb

## 2014-11-18 DIAGNOSIS — I25812 Atherosclerosis of bypass graft of coronary artery of transplanted heart without angina pectoris: Secondary | ICD-10-CM

## 2014-11-18 NOTE — Patient Instructions (Signed)
Your physician recommends that you continue on your current medications as directed. Please refer to the Current Medication list given to you today.  Your physician recommends that you return for lab work in:  TODAY BMET    Your physician has requested that you have a lexiscan myoview. For further information please visit [email or web address] Please follow instruction sheet, as given.   Your physician recommends that you schedule a follow-up appointment in:  WITH DR MCALHANY IN 6 TO 8 WEEKS

## 2014-11-18 NOTE — Progress Notes (Addendum)
Cardiology Office Note    Date:  11/18/2014   ID:  Ryan [name], DOB May 09, 1966, MRN [id number]  PCP:  Dorrene German, MD  Cardiologist:  Dr .Sanjuana Kava    History of Present Illness: Ryan [name] is a 48 y.o. male with a history of ongoing tobacco abuse, anterior STEMI January 2013 s/p DES LAD x 1, HLD, PAD and non compliance who was added on the FLEX clinic schedule today for pre-operative clearance.   He initially presented to the [facility] ED on 12/2011 with ant STEMI and had cardiac arrest. He was transported emergently to [facility] where he received PCTA/PCI with BMS to mLAD. The third OM branch was chronically occluded and the RCA had mild disease. His post MI LVEF was 35%. He did well post MI and was discharged home with a LifeVest. Seen in 01/24/12 for follow up and he had c/o pain in both legs with walking. Non-invasive imaging with ABI of 0.53 on the right and 0.45 on the left with suggestion of aortoiliac inflow disease. There also appeared to be left subclavian artery stenosis on non-invasive imaging. He subsequently underwent a distal aortogram with bilateral lower ext runoff and left subclavian angiography on 01/31/12. His distal aorta was occluded just distal to the renals. His left subclavian was occluded at the ostium. It was felt that he was not a good candidate for aorto-bifemoral bypass at that time since he was on Effient. Follow up echo 02/16/12 with normal LV function. His Lifevest was d/c'ed. He was seen by Dr. Durene Cal in [facility] and has had aorto-bifemoral bypass in 01/2013 and left carotid to subclavian artery bypass 04/2013. He is still followed in [facility] by Dr. Myra Gianotti. Eugenie Birks myoview 2013 with normal LV function, low risk study without any large areas of ischemia.   He was seen in 03/2014 by Dr. Sanjuana Kava for cardiology follow up and complained of exertional chest pain and fatigue as well as chronic left leg pain and numbness. He has been dismissed from the pain clinic  due to refusing to take drug screen. He was set up for a lexiscan myoview which he did not show up for.  Dr. Weldon Picking nurse has had multiple conversations with Cordelia Pen at Dr. Carlos Levering office. Pt was at [facility] and his long needed hand surgery has been rescheduled to November 21, 2014. Pt will be taken to hospital for surgery by sheriff's department and then taken back to detention center. Pt has told Dr.Gramig he is taking Coumadin. Office called [place] to request surgical clearance and also to stop Coumadin prior to procedure. Apprarantly the patient has a laceration and unless surgery is done soon he could lose movement.Their office was notified that he was on Clopidogrel and ASA but he is not on Coumadin and that we would see him for pre op clearance on 11/18/14.   He presents today for surgical clearance. He has no real complaints today. He had chest pain about 1 week ago but not sure if it was indigestion. No SOB, orthopnea, PND or LE edema. He says he feels very good and is very concerned about his hand.     Recent Labs/Images:  No results for input(s): NA, K, BUN, CREATININE, ALT, HGB, TSH, LDLCALC, LDLDIRECT, HDL, BNP, PROBNP in the last 8760 hours.  Invalid input(s): LDL   No results found.   Wt Readings from Last 3 Encounters:  11/18/14 195 lb (88.451 kg)  07/16/14 201 lb (91.173 kg)  07/07/14 200 lb (90.719  kg)     Past Medical History  Diagnosis Date  . Anal fissure   . Rectal polyp   . Mixed hyperlipidemia     takes Simvastatin daily  . PAD (peripheral artery disease)   . GERD (gastroesophageal reflux disease)     "takes tums"  . Hypertension     takes Enalapril and Coreg daily   . Coronary atherosclerosis of native coronary artery     takes Effient but on hold for surgery  . MI (myocardial infarction)     AMI 1/13 - complicated by VT/Tosades  . Bruises easily     d/t being on Effient  . History of kidney stones   . Diabetes mellitus  without complication     takes Lantus daily  . History of blood transfusion     no abnormal reaction noted    Current Outpatient Prescriptions  Medication Sig Dispense Refill  . aspirin EC 81 MG tablet Take 162 mg by mouth daily.    . carvedilol (COREG) 3.125 MG tablet TAKE ONE TABLET BY MOUTH TWICE A DAY WITH MEALS 30 tablet 0  . clopidogrel (PLAVIX) 75 MG tablet TAKE ONE TABLET BY MOUTH ONE TIME DAILY 15 tablet 0  . cyclobenzaprine (FLEXERIL) 10 MG tablet Take 10 mg by mouth 3 (three) times daily as needed for muscle spasms.    . enalapril (VASOTEC) 5 MG tablet TAKE ONE TABLET BY MOUTH TWICE DAILY 30 tablet 0  . Insulin Glargine (LANTUS SOLOSTAR) 100 UNIT/ML Solostar Pen Inject 35 Units into the skin at bedtime.    . isosorbide mononitrate (IMDUR) 30 MG 24 hr tablet TAKE ONE TABLET BY MOUTH ONE TIME DAILY 15 tablet 0  . nitroGLYCERIN (NITROSTAT) 0.4 MG SL tablet Place 1 tablet (0.4 mg total) under the tongue every 5 (five) minutes as needed for chest pain. 25 tablet 6  . simvastatin (ZOCOR) 40 MG tablet Take 40 mg by mouth at bedtime.    Marland Kitchen. spironolactone (ALDACTONE) 25 MG tablet Take 12.5 mg by mouth daily.     No current facility-administered medications for this visit.     Allergies:   Hydrocodone   Social History:  The patient  reports that he has been smoking Cigarettes.  He has a 6.25 pack-year smoking history. He has never used smokeless tobacco. He reports that he drinks alcohol. He reports that he does not use illicit drugs.   Family History:  The patient's family history includes COPD in his mother; Cancer in his father and maternal uncle; Diabetes in his mother.   ROS:  Please see the history of present illness. All other systems reviewed and negative.    PHYSICAL EXAM: VS:  BP 122/62 mmHg  Pulse 72  Ht 5' 8.5" (1.74 m)  Wt 195 lb (88.451 kg)  BMI 29.21 kg/m2 Well nourished, well developed, in no acute distress HEENT: normal Neck: no JVD Cardiac:  normal S1, S2;  RRR; no murmur Lungs:  clear to auscultation bilaterally, no wheezing, rhonchi or rales Abd: soft, nontender, no hepatomegaly Ext: no edema Skin: warm and dry Neuro:  CNs 2-12 intact, no focal abnormalities noted  EKG:  NSR, RAD, TW abnormality inferiorly. HR 72.   ASSESSMENT AND PLAN:  Ryan [name] is a 48 y.o. male with a history of ongoing tobacco abuse, anterior STEMI January 2013 s/p DES LAD x 1, HLD, PAD and non compliance who was added on the FLEX clinic schedule today for pre-operative clearance for urgent hand surgery next week.  CAD: s/p anterior STEMI January 2013 with occluded LAD treated with BMS.  -- Had some chest pain last week. He needs cardiac clearance for hand surgery next week. Steffanie Dunn has been arranged for tomorrow AM to rule out ischemia. This has been expedited as his surgery is planned for next Tuesday (apparantly if he waits longer he will loose function in his hand). -- He has discontinued Plavix but continues on ASA, BB and statin.   PAD: Stable. S/p aorto-bifemoral bypass and left carotid to subclavian artery bypass. Followed in [facility].  Tobacco abuse: Complete cessation encouraged. He is trying to stop.    Disposition:  FU with Dr. Sanjuana Kava in 6-8 weeks or sooner if necessary.   Signed, Eustace Pen, PA-C, MHS 11/18/2014 2:22 PM    [facility] [address] [place], [place], [place]  [address] Phone: [phone]; Fax: [phone]

## 2014-11-18 NOTE — Telephone Encounter (Signed)
Pt called back and spoke with operator. He cannot be here today until the afternoon.  Appt made for 2:20 today with K.Thompson,PA

## 2014-11-18 NOTE — Telephone Encounter (Signed)
Received staff message that pt walked into office prior to 8 AM this morning to request appt with Dr. [name]. Pt no longer in office. He left phone number [phone] as call back number.  Pt can be seen in office today by K.Janee Morn, [place].  I placed call to pt at number he gave and left message to call office as soon as possible.

## 2014-11-18 NOTE — Telephone Encounter (Signed)
Message left for Sherri at Dr. Carlos Levering office that clearance would be determined based on results of Lexiscan myoview scheduled for tomorrow.

## 2014-11-18 NOTE — Telephone Encounter (Signed)
Spoke with pt and appt made for him to see K. Janee Morn, [place] today in office at 9 AM

## 2014-11-19 ENCOUNTER — Ambulatory Visit (HOSPITAL_COMMUNITY): Payer: Medicaid Other | Attending: Cardiology | Admitting: Radiology

## 2014-11-19 DIAGNOSIS — Z6828 Body mass index (BMI) 28.0-28.9, adult: Secondary | ICD-10-CM | POA: Insufficient documentation

## 2014-11-19 DIAGNOSIS — R0789 Other chest pain: Secondary | ICD-10-CM | POA: Diagnosis not present

## 2014-11-19 DIAGNOSIS — I25812 Atherosclerosis of bypass graft of coronary artery of transplanted heart without angina pectoris: Secondary | ICD-10-CM

## 2014-11-19 DIAGNOSIS — I10 Essential (primary) hypertension: Secondary | ICD-10-CM | POA: Insufficient documentation

## 2014-11-19 DIAGNOSIS — Z959 Presence of cardiac and vascular implant and graft, unspecified: Secondary | ICD-10-CM | POA: Insufficient documentation

## 2014-11-19 DIAGNOSIS — Z794 Long term (current) use of insulin: Secondary | ICD-10-CM | POA: Insufficient documentation

## 2014-11-19 DIAGNOSIS — R0600 Dyspnea, unspecified: Secondary | ICD-10-CM | POA: Diagnosis not present

## 2014-11-19 DIAGNOSIS — Z01818 Encounter for other preprocedural examination: Secondary | ICD-10-CM | POA: Diagnosis present

## 2014-11-19 DIAGNOSIS — I2510 Atherosclerotic heart disease of native coronary artery without angina pectoris: Secondary | ICD-10-CM | POA: Diagnosis not present

## 2014-11-19 DIAGNOSIS — R079 Chest pain, unspecified: Secondary | ICD-10-CM | POA: Insufficient documentation

## 2014-11-19 DIAGNOSIS — E119 Type 2 diabetes mellitus without complications: Secondary | ICD-10-CM | POA: Insufficient documentation

## 2014-11-19 MED ORDER — TECHNETIUM TC 99M SESTAMIBI GENERIC - CARDIOLITE
30.0000 | Freq: Once | INTRAVENOUS | Status: AC | PRN
  Administered 2014-11-19: 30 via INTRAVENOUS

## 2014-11-19 MED ORDER — REGADENOSON 0.4 MG/5ML IV SOLN
0.4000 mg | Freq: Once | INTRAVENOUS | Status: AC
  Administered 2014-11-19: 0.4 mg via INTRAVENOUS

## 2014-11-19 MED ORDER — TECHNETIUM TC 99M SESTAMIBI GENERIC - CARDIOLITE
10.0000 | Freq: Once | INTRAVENOUS | Status: AC | PRN
  Administered 2014-11-19: 10 via INTRAVENOUS

## 2014-11-19 NOTE — Progress Notes (Signed)
[facility] SITE 3 NUCLEAR MED [address] [place], [place] [address] [phone]    Cardiology Nuclear Med Study  Ryan [name] is a 48 y.o. male     MRN : [id number]     DOB: 1966/11/13  Procedure Date: 11/19/2014  Nuclear Med Background Indication for Stress Test:  Evaluation for Ischemia, Stent Patency, and Surgical Clearance for (R)Hand surgery- Dominica Severin, MD on 11-24-2014 History:  CAD, Stent,and 08-15-2012 Myocardial Perfusion Imaging-Small Apical Ischemia,EF=50%, Low risk Cardiac Risk Factors: Carotid Disease, Hypertension and IDDM   Symptoms: Chest Pain with/without exertion (last occurrence couple months ago), DOE   Nuclear Pre-Procedure Caffeine/Decaff Intake:  Decaff. Coffee at 4 am NPO After: 8:00pm   Lungs:  clear O2 Sat: 98% on room air. IV 0.9% NS with Angio Cath:  22g  IV Site: L Hand  IV Started by:  Bonnita Levan, RN  Chest Size (in):  46 Cup Size: n/a  Height: 5\' 9"  (1.753 m)  Weight:  196 lb (88.905 kg)  BMI:  Body mass index is 28.93 kg/(m^2). Tech Comments:  CBG @4  am =130, Coreg held x 24 hrs    Nuclear Med Study 1 or 2 day study: 1 day  Stress Test Type:  Eugenie Birks  Reading MD: N/A  Order Authorizing Provider:  [name], MD, Cline Crock, PAC  Resting Radionuclide: Technetium 53m Sestamibi  Resting Radionuclide Dose: 11.0 mCi   Stress Radionuclide:  Technetium 42m Sestamibi  Stress Radionuclide Dose: 33.0 mCi           Stress Protocol Rest HR: 54 Stress HR: 83  Rest BP: 102/58 Stress BP: 113/64  Exercise Time (min): n/a METS: n/a   Predicted Max HR: 172 bpm % Max HR: 48.26 bpm Rate Pressure Product: 9379   Dose of Adenosine (mg):  n/a Dose of Lexiscan: 0.4 mg  Dose of Atropine (mg): n/a Dose of Dobutamine: n/a mcg/kg/min (at max HR)  Stress Test Technologist: Irean Hong, RN  Nuclear Technologist:  Kerby Nora, CNMT     Rest Procedure:  Myocardial perfusion imaging was performed at rest 45 minutes  following the intravenous administration of Technetium 13m Sestamibi. Rest ECG: NSR - Normal EKG and NSR with non-specific ST-T wave changes  Stress Procedure:  The patient received IV Lexiscan 0.4 mg over 15-seconds.  Technetium 64m Sestamibi injected at 30-seconds. The patient complained of burning in chest and lightheadedness with Lexiscan. The patient had a hypotensive response, BP 82/53, to Lexiscan that subsided after trendelenburg position. Quantitative spect images were obtained after a 45 minute delay. Stress ECG: There are scattered PVCs.  QPS Raw Data Images:  Normal; no motion artifact; normal heart/lung ratio. Stress Images:  There is decreased uptake in the apex. Rest Images:  There is decreased uptake in the apex. Subtraction (SDS):  Fixed apical perfusion defect Transient Ischemic Dilatation (Normal <1.22):  0.93 Lung/Heart Ratio (Normal <0.45):  0.36  Quantitative Gated Spect Images QGS EDV:  140 ml QGS ESV:  59 ml  Impression Exercise Capacity:  Lexiscan with no exercise. BP Response:  Normal blood pressure response. Clinical Symptoms:  Atypical chest pain. ECG Impression:  There are scattered PVCs. Comparison with Prior Nuclear Study: No significant change from previous study  Overall Impression:  Low risk stress nuclear study with small sized, moderarte intensity fixed apical defect, which was previously reported. This is likely artifact. .  LV Ejection Fraction: 58%.  LV Wall Motion:  NL LV Function; NL Wall Motion   Chrystie Nose,  MD, [facility] Specialty HospitalFACC Board Certified in Nuclear Cardiology Attending Cardiologist [facility]

## 2014-11-20 ENCOUNTER — Encounter: Payer: Self-pay | Admitting: Cardiovascular Disease

## 2014-11-20 NOTE — Telephone Encounter (Signed)
Sherri calling stating Dr. Amanda Pea needed to know clearance today regarding stress test done yesterday.  Advised that study has not been reviewed and will forward to Dr. Sanjuana Kava for recommendations. Surgery is scheduled for tomorrow 12/19.

## 2014-11-20 NOTE — Telephone Encounter (Signed)
I have reviewed the stress test earlier this am. It is ok. I sent this report to the Triage folder since Ryan Bible is off today. Can we call Ryan [name] and let him know it is ok. I also wrote a letter in epic and forwarded to Gramig but we can print it out and send to his office if they need the letter. Thanks, chris

## 2014-11-20 NOTE — Telephone Encounter (Signed)
New  Message  Sherri from Dr. Carlos Levering office requests to speak with Rn about surgical clearance. Please call back and discuss.

## 2014-11-20 NOTE — Telephone Encounter (Signed)
Lm w/Sherri in Dr. Amanda Pea office that Dr. Sanjuana Kava has reviewed his stress test and he is cleared for surgery.  He also put letter in epic to Dr. Amanda Pea that he is cleared.

## 2014-11-21 ENCOUNTER — Ambulatory Visit (HOSPITAL_COMMUNITY): Payer: Medicaid Other | Admitting: Certified Registered Nurse Anesthetist

## 2014-11-21 ENCOUNTER — Encounter (HOSPITAL_COMMUNITY)
Admission: RE | Disposition: A | Discharge status: Home / Self Care | Payer: Self-pay | Source: Ambulatory Visit | Attending: Orthopedic Surgery

## 2014-11-21 ENCOUNTER — Ambulatory Visit (HOSPITAL_COMMUNITY)
Admission: RE | Admit: 2014-11-21 | Discharge: 2014-11-21 | Disposition: A | Discharge status: Home / Self Care | Payer: Medicaid Other | Source: Ambulatory Visit | Attending: Orthopedic Surgery | Admitting: Orthopedic Surgery

## 2014-11-21 ENCOUNTER — Encounter (HOSPITAL_COMMUNITY): Payer: Self-pay | Admitting: *Deleted

## 2014-11-21 DIAGNOSIS — Z7982 Long term (current) use of aspirin: Secondary | ICD-10-CM | POA: Diagnosis not present

## 2014-11-21 DIAGNOSIS — F17210 Nicotine dependence, cigarettes, uncomplicated: Secondary | ICD-10-CM | POA: Diagnosis not present

## 2014-11-21 DIAGNOSIS — I252 Old myocardial infarction: Secondary | ICD-10-CM | POA: Insufficient documentation

## 2014-11-21 DIAGNOSIS — S61411A Laceration without foreign body of right hand, initial encounter: Secondary | ICD-10-CM | POA: Diagnosis present

## 2014-11-21 DIAGNOSIS — I10 Essential (primary) hypertension: Secondary | ICD-10-CM | POA: Diagnosis not present

## 2014-11-21 DIAGNOSIS — I255 Ischemic cardiomyopathy: Secondary | ICD-10-CM | POA: Diagnosis not present

## 2014-11-21 DIAGNOSIS — Z794 Long term (current) use of insulin: Secondary | ICD-10-CM | POA: Diagnosis not present

## 2014-11-21 DIAGNOSIS — S61511A Laceration without foreign body of right wrist, initial encounter: Secondary | ICD-10-CM | POA: Insufficient documentation

## 2014-11-21 DIAGNOSIS — K219 Gastro-esophageal reflux disease without esophagitis: Secondary | ICD-10-CM | POA: Diagnosis not present

## 2014-11-21 DIAGNOSIS — I2510 Atherosclerotic heart disease of native coronary artery without angina pectoris: Secondary | ICD-10-CM | POA: Diagnosis not present

## 2014-11-21 DIAGNOSIS — I6529 Occlusion and stenosis of unspecified carotid artery: Secondary | ICD-10-CM | POA: Diagnosis not present

## 2014-11-21 DIAGNOSIS — E782 Mixed hyperlipidemia: Secondary | ICD-10-CM | POA: Diagnosis not present

## 2014-11-21 DIAGNOSIS — E119 Type 2 diabetes mellitus without complications: Secondary | ICD-10-CM | POA: Diagnosis not present

## 2014-11-21 DIAGNOSIS — G458 Other transient cerebral ischemic attacks and related syndromes: Secondary | ICD-10-CM | POA: Insufficient documentation

## 2014-11-21 HISTORY — PX: I & D EXTREMITY: SHX5045

## 2014-11-21 HISTORY — PX: REPAIR EXTENSOR TENDON: SHX5382

## 2014-11-21 LAB — BASIC METABOLIC PANEL
Anion gap: 14 (ref 5–15)
BUN: 13 mg/dL (ref 6–23)
CO2: 24 mEq/L (ref 19–32)
Calcium: 8.9 mg/dL (ref 8.4–10.5)
Chloride: 100 mEq/L (ref 96–112)
Creatinine, Ser: 0.96 mg/dL (ref 0.50–1.35)
GFR calc Af Amer: >90 mL/min (ref >90)
GLUCOSE: 110 mg/dL — AB (ref 70–99)
Potassium: 4 mEq/L (ref 3.7–5.3)
Sodium: 138 mEq/L (ref 137–147)

## 2014-11-21 LAB — CBC
HCT: 41.9 % (ref 39.0–52.0)
HEMOGLOBIN: 14.4 g/dL (ref 13.0–17.0)
MCH: 31.6 pg (ref 26.0–34.0)
MCHC: 34.4 g/dL (ref 30.0–36.0)
MCV: 92.1 fL (ref 78.0–100.0)
Platelets: 216 10*3/uL (ref 150–400)
RBC: 4.55 MIL/uL (ref 4.22–5.81)
RDW: 13.6 % (ref 11.5–15.5)
WBC: 7.3 10*3/uL (ref 4.0–10.5)

## 2014-11-21 LAB — GLUCOSE, CAPILLARY
GLUCOSE-CAPILLARY: 136 mg/dL — AB (ref 70–99)
Glucose-Capillary: 106 mg/dL — ABNORMAL HIGH (ref 70–99)

## 2014-11-21 SURGERY — IRRIGATION AND DEBRIDEMENT EXTREMITY
Anesthesia: General | Site: Hand | Laterality: Right

## 2014-11-21 MED ORDER — PROPOFOL 10 MG/ML IV BOLUS
INTRAVENOUS | Status: AC
  Filled 2014-11-21: qty 20

## 2014-11-21 MED ORDER — MIDAZOLAM HCL 5 MG/5ML IJ SOLN
INTRAMUSCULAR | Status: DC | PRN
  Administered 2014-11-21: 2 mg via INTRAVENOUS

## 2014-11-21 MED ORDER — HYDROMORPHONE HCL 1 MG/ML IJ SOLN
0.2500 mg | INTRAMUSCULAR | Status: DC | PRN
  Administered 2014-11-21 (×2): 0.5 mg via INTRAVENOUS

## 2014-11-21 MED ORDER — CEPHALEXIN 500 MG PO CAPS: 500.0000 mg | ORAL_CAPSULE | Freq: Four times a day (QID) | ORAL | Status: DC

## 2014-11-21 MED ORDER — LIDOCAINE HCL (CARDIAC) 20 MG/ML IV SOLN
INTRAVENOUS | Status: AC
Start: 2014-11-21 — End: 2014-11-21
  Filled 2014-11-21: qty 5

## 2014-11-21 MED ORDER — MIDAZOLAM HCL 2 MG/2ML IJ SOLN
INTRAMUSCULAR | Status: AC
  Filled 2014-11-21: qty 2

## 2014-11-21 MED ORDER — ONDANSETRON HCL 4 MG/2ML IJ SOLN
INTRAMUSCULAR | Status: DC | PRN
  Administered 2014-11-21: 4 mg via INTRAVENOUS

## 2014-11-21 MED ORDER — OXYCODONE HCL 5 MG PO TABS: 5.0000 mg | ORAL_TABLET | ORAL | Status: DC | PRN

## 2014-11-21 MED ORDER — HYDROMORPHONE HCL 1 MG/ML IJ SOLN
INTRAMUSCULAR | Status: AC
  Administered 2014-11-21: 0.5 mg via INTRAVENOUS
  Filled 2014-11-21: qty 1

## 2014-11-21 MED ORDER — FENTANYL CITRATE 0.05 MG/ML IJ SOLN
INTRAMUSCULAR | Status: DC | PRN
  Administered 2014-11-21 (×4): 50 ug via INTRAVENOUS

## 2014-11-21 MED ORDER — EPHEDRINE SULFATE 50 MG/ML IJ SOLN
INTRAMUSCULAR | Status: DC | PRN
  Administered 2014-11-21: 10 mg via INTRAVENOUS

## 2014-11-21 MED ORDER — ONDANSETRON HCL 4 MG/2ML IJ SOLN
INTRAMUSCULAR | Status: AC
  Filled 2014-11-21: qty 2

## 2014-11-21 MED ORDER — FENTANYL CITRATE 0.05 MG/ML IJ SOLN
INTRAMUSCULAR | Status: AC
  Filled 2014-11-21: qty 5

## 2014-11-21 MED ORDER — CARVEDILOL 3.125 MG PO TABS
3.1250 mg | ORAL_TABLET | Freq: Once | ORAL | Status: AC
  Administered 2014-11-21: 3.125 mg via ORAL

## 2014-11-21 MED ORDER — SODIUM CHLORIDE 0.9 % IR SOLN
Status: DC | PRN
  Administered 2014-11-21: 1000 mL

## 2014-11-21 MED ORDER — CARVEDILOL 3.125 MG PO TABS
ORAL_TABLET | ORAL | Status: AC
  Administered 2014-11-21: 3.125 mg via ORAL
  Filled 2014-11-21: qty 1

## 2014-11-21 MED ORDER — OXYCODONE HCL 5 MG PO TABS
ORAL_TABLET | ORAL | Status: AC
  Filled 2014-11-21: qty 1

## 2014-11-21 MED ORDER — CEFAZOLIN SODIUM-DEXTROSE 2-3 GM-% IV SOLR
INTRAVENOUS | Status: DC | PRN
  Administered 2014-11-21: 2 g via INTRAVENOUS

## 2014-11-21 MED ORDER — OXYCODONE HCL 5 MG PO TABS
5.0000 mg | ORAL_TABLET | ORAL | Status: DC | PRN
  Administered 2014-11-21: 5 mg via ORAL

## 2014-11-21 MED ORDER — PHENYLEPHRINE HCL 10 MG/ML IJ SOLN
INTRAMUSCULAR | Status: DC | PRN
  Administered 2014-11-21: 80 ug via INTRAVENOUS

## 2014-11-21 MED ORDER — ONDANSETRON HCL 4 MG/2ML IJ SOLN: INTRAMUSCULAR | Status: DC | PRN

## 2014-11-21 MED ORDER — LACTATED RINGERS IV SOLN
INTRAVENOUS | Status: DC | PRN
  Administered 2014-11-21 (×2): via INTRAVENOUS

## 2014-11-21 MED ORDER — PROMETHAZINE HCL 25 MG/ML IJ SOLN: 6.2500 mg | INTRAMUSCULAR | Status: DC | PRN

## 2014-11-21 SURGICAL SUPPLY — 51 items
BANDAGE ELASTIC 3 VELCRO ST LF (GAUZE/BANDAGES/DRESSINGS) ×2 IMPLANT
BANDAGE ELASTIC 4 VELCRO ST LF (GAUZE/BANDAGES/DRESSINGS) ×2 IMPLANT
BNDG CONFORM 2 STRL LF (GAUZE/BANDAGES/DRESSINGS) IMPLANT
BNDG GAUZE ELAST 4 BULKY (GAUZE/BANDAGES/DRESSINGS) ×2 IMPLANT
CORDS BIPOLAR (ELECTRODE) ×2 IMPLANT
CUFF TOURNIQUET SINGLE 18IN (TOURNIQUET CUFF) ×2 IMPLANT
CUFF TOURNIQUET SINGLE 24IN (TOURNIQUET CUFF) IMPLANT
DRSG ADAPTIC 3X8 NADH LF (GAUZE/BANDAGES/DRESSINGS) ×2 IMPLANT
ELECT REM PT RETURN 9FT ADLT (ELECTROSURGICAL)
ELECTRODE REM PT RTRN 9FT ADLT (ELECTROSURGICAL) IMPLANT
GAUZE SPONGE 4X4 12PLY STRL (GAUZE/BANDAGES/DRESSINGS) ×2 IMPLANT
GAUZE XEROFORM 1X8 LF (GAUZE/BANDAGES/DRESSINGS) ×2 IMPLANT
GLOVE BIO SURGEON STRL SZ7.5 (GLOVE) ×2 IMPLANT
GLOVE BIOGEL M STRL SZ7.5 (GLOVE) ×2 IMPLANT
GLOVE BIOGEL PI IND STRL 6.5 (GLOVE) ×2 IMPLANT
GLOVE BIOGEL PI INDICATOR 6.5 (GLOVE) ×2
GLOVE ECLIPSE 6.5 STRL STRAW (GLOVE) ×4 IMPLANT
GLOVE SS BIOGEL STRL SZ 8 (GLOVE) ×1 IMPLANT
GLOVE SUPERSENSE BIOGEL SZ 8 (GLOVE) ×1
GOWN STRL REUS W/ TWL LRG LVL3 (GOWN DISPOSABLE) ×3 IMPLANT
GOWN STRL REUS W/ TWL XL LVL3 (GOWN DISPOSABLE) ×3 IMPLANT
GOWN STRL REUS W/TWL LRG LVL3 (GOWN DISPOSABLE) ×3
GOWN STRL REUS W/TWL XL LVL3 (GOWN DISPOSABLE) ×3
HANDPIECE INTERPULSE COAX TIP (DISPOSABLE)
KIT BASIN OR (CUSTOM PROCEDURE TRAY) ×2 IMPLANT
KIT ROOM TURNOVER OR (KITS) ×2 IMPLANT
MANIFOLD NEPTUNE II (INSTRUMENTS) ×2 IMPLANT
NEEDLE HYPO 25GX1X1/2 BEV (NEEDLE) IMPLANT
NS IRRIG 1000ML POUR BTL (IV SOLUTION) ×2 IMPLANT
PACK ORTHO EXTREMITY (CUSTOM PROCEDURE TRAY) ×2 IMPLANT
PAD ARMBOARD 7.5X6 YLW CONV (MISCELLANEOUS) ×4 IMPLANT
PAD CAST 3X4 CTTN HI CHSV (CAST SUPPLIES) ×1 IMPLANT
PAD CAST 4YDX4 CTTN HI CHSV (CAST SUPPLIES) ×1 IMPLANT
PADDING CAST COTTON 3X4 STRL (CAST SUPPLIES) ×1
PADDING CAST COTTON 4X4 STRL (CAST SUPPLIES) ×1
SCOTCHCAST PLUS 2X4 WHITE (CAST SUPPLIES) ×2 IMPLANT
SCOTCHCAST PLUS 3X4 WHITE (CAST SUPPLIES) ×2 IMPLANT
SET HNDPC FAN SPRY TIP SCT (DISPOSABLE) IMPLANT
SET IRRIG Y TYPE TUR BLADDER L (SET/KITS/TRAYS/PACK) ×2 IMPLANT
SPONGE GAUZE 4X4 12PLY STER LF (GAUZE/BANDAGES/DRESSINGS) ×2 IMPLANT
SPONGE LAP 18X18 X RAY DECT (DISPOSABLE) ×2 IMPLANT
SPONGE LAP 4X18 X RAY DECT (DISPOSABLE) ×2 IMPLANT
STOCKINETTE SYNTHETIC 3 UNSTER (CAST SUPPLIES) ×2 IMPLANT
SUT PROLENE 4 0 P 3 18 (SUTURE) ×2 IMPLANT
SYR CONTROL 10ML LL (SYRINGE) IMPLANT
TOWEL OR 17X24 6PK STRL BLUE (TOWEL DISPOSABLE) ×2 IMPLANT
TOWEL OR 17X26 10 PK STRL BLUE (TOWEL DISPOSABLE) ×2 IMPLANT
TUBE ANAEROBIC SPECIMEN COL (MISCELLANEOUS) IMPLANT
TUBE CONNECTING 12X1/4 (SUCTIONS) ×2 IMPLANT
WATER STERILE IRR 1000ML POUR (IV SOLUTION) ×2 IMPLANT
YANKAUER SUCT BULB TIP NO VENT (SUCTIONS) ×2 IMPLANT

## 2014-11-21 NOTE — Discharge Instructions (Signed)
Keep bandage clean and dry.  Call for any problems.  No smoking.  Criteria for driving a car: you should be off your pain medicine for 7-8 hours, able to drive one handed(confident), thinking clearly and feeling able in your judgement to drive. °Continue elevation as it will decrease swelling.  If instructed by MD move your fingers within the confines of the bandage/splint.  Use ice if instructed by your MD. Call immediately for any sudden loss of feeling in your hand/arm or change in functional abilities of the extremity. ° ° °We recommend that you to take vitamin C 1000 mg a day to promote healing we also recommend that if you require her pain medicine that he take a stool softener to prevent constipation as most pain medicines will have constipation side effects. We recommend either Peri-Colace or Senokot and recommend that you also consider adding MiraLAX to prevent the constipation affects from pain medicine if you are required to use them. These medicines are over the counter and maybe purchased at a local pharmacy. ° °

## 2014-11-21 NOTE — H&P (Signed)
Ryan [name] is an 48 y.o. male.   Chief Complaint: laceration right hand with loss of extensor tendon function HPI: The patient is a 48 year old gentleman who presented to our office setting over 10 days ago at which time he had sustained a laceration to the dorsal aspect of his right hand with loss of tenderness function about the extensor tendon of the ring finger and questionable partial laceration to the middle finger. We discussed with him our concerns and need for surgical intervention. Unfortunately his surgical endeavors been delayed secondary to incarceration. He presents today for surgical intervention. The patient has been cleared medically and from a cardiac standpoint given his past medical history. All questions are encouraged and answered.  Past Medical History  Diagnosis Date  . Anal fissure   . Rectal polyp   . Mixed hyperlipidemia     takes Simvastatin daily  . PAD (peripheral artery disease)   . GERD (gastroesophageal reflux disease)     "takes tums"  . Hypertension     takes Enalapril and Coreg daily   . Coronary atherosclerosis of native coronary artery     takes Effient but on hold for surgery  . MI (myocardial infarction)     AMI 4/09 - complicated by VT/Tosades  . Bruises easily     d/t being on Effient  . History of kidney stones   . Diabetes mellitus without complication     takes Lantus daily  . History of blood transfusion     no abnormal reaction noted    Past Surgical History  Procedure Laterality Date  . Appendectomy    . Diagnostic laparoscopy    . Anal fissurectomy    . Coronary angioplasty      stent placed Dec 12, 2011  . Aorta - bilateral femoral artery bypass graft N/A 01/16/2013    Procedure: AORTA BIFEMORAL BYPASS GRAFT;  Surgeon: Serafina Mitchell, MD;  Location: [facility];  Service: Vascular;  Laterality: N/A;  . Colonoscopy    . Carotid-subclavian bypass graft Left 04/03/2013    Procedure: BYPASS GRAFT CAROTID-SUBCLAVIAN;  Surgeon: Serafina Mitchell, MD;  Location: [facility] OR;  Service: Vascular;  Laterality: Left;  . I&d extremity Left 04/18/2013    Procedure: debridement of left neck lymphocele;  Surgeon: Serafina Mitchell, MD;  Location: [facility] OR;  Service: Vascular;  Laterality: Left;  I and D of left neck  . Left heart catheterization with coronary angiogram N/A 12/12/2011    Procedure: LEFT HEART CATHETERIZATION WITH CORONARY ANGIOGRAM;  Surgeon: Burnell Blanks, MD;  Location: [facility] CATH LAB;  Service: Cardiovascular;  Laterality: N/A;  . Percutaneous coronary stent intervention (pci-s) Right 12/12/2011    Procedure: PERCUTANEOUS CORONARY STENT INTERVENTION (PCI-S);  Surgeon: Burnell Blanks, MD;  Location: [facility] CATH LAB;  Service: Cardiovascular;  Laterality: Right;  . Lower extremity angiogram N/A 01/31/2012    Procedure: LOWER EXTREMITY ANGIOGRAM;  Surgeon: Burnell Blanks, MD;  Location: [facility] CATH LAB;  Service: Cardiovascular;  Laterality: N/A;    Family History  Problem Relation Age of Onset  . Cancer Father     Lung  . Cancer Maternal Uncle     Colon  . Diabetes Mother   . COPD Mother    Social History:  reports that he has been smoking Cigarettes.  He has a 6.25 pack-year smoking history. He has never used smokeless tobacco. He reports that he drinks alcohol. He reports that he does not use illicit drugs.  Allergies:  Allergies  Allergen Reactions  . Hydrocodone Nausea And Vomiting    Medications Prior to Admission  Medication Sig Dispense Refill  . isosorbide mononitrate (IMDUR) 30 MG 24 hr tablet TAKE ONE TABLET BY MOUTH ONE TIME DAILY 15 tablet 0  . simvastatin (ZOCOR) 40 MG tablet Take 40 mg by mouth at bedtime.    Marland Kitchen spironolactone (ALDACTONE) 25 MG tablet Take 12.5 mg by mouth daily.    Marland Kitchen aspirin EC 81 MG tablet Take 162 mg by mouth daily.    . carvedilol (COREG) 3.125 MG tablet TAKE ONE TABLET BY MOUTH TWICE A DAY WITH MEALS 30 tablet 0  . clopidogrel (PLAVIX) 75 MG tablet TAKE ONE TABLET BY MOUTH  ONE TIME DAILY 15 tablet 0  . cyclobenzaprine (FLEXERIL) 10 MG tablet Take 10 mg by mouth 3 (three) times daily as needed for muscle spasms.    . enalapril (VASOTEC) 5 MG tablet TAKE ONE TABLET BY MOUTH TWICE DAILY 30 tablet 0  . Insulin Glargine (LANTUS SOLOSTAR) 100 UNIT/ML Solostar Pen Inject 35 Units into the skin at bedtime.    . nitroGLYCERIN (NITROSTAT) 0.4 MG SL tablet Place 1 tablet (0.4 mg total) under the tongue every 5 (five) minutes as needed for chest pain. 25 tablet 6    Results for orders placed or performed during the hospital encounter of 11/21/14 (from the past 48 hour(s))  Basic metabolic panel     Status: Abnormal   Collection Time: 11/21/14  6:21 AM  Result Value Ref Range   Sodium 138 137 - 147 mEq/L   Potassium 4.0 3.7 - 5.3 mEq/L   Chloride 100 96 - 112 mEq/L   CO2 24 19 - 32 mEq/L   Glucose, Bld 110 (H) 70 - 99 mg/dL   BUN 13 6 - 23 mg/dL   Creatinine, Ser 0.96 0.50 - 1.35 mg/dL   Calcium 8.9 8.4 - 10.5 mg/dL   GFR calc non Af Amer >90 >90 mL/min   GFR calc Af Amer >90 >90 mL/min    Comment: (NOTE) The eGFR has been calculated using the CKD EPI equation. This calculation has not been validated in all clinical situations. eGFR's persistently <90 mL/min signify possible Chronic Kidney Disease.    Anion gap 14 5 - 15  CBC     Status: None   Collection Time: 11/21/14  6:21 AM  Result Value Ref Range   WBC 7.3 4.0 - 10.5 K/uL   RBC 4.55 4.22 - 5.81 MIL/uL   Hemoglobin 14.4 13.0 - 17.0 g/dL   HCT 41.9 39.0 - 52.0 %   MCV 92.1 78.0 - 100.0 fL   MCH 31.6 26.0 - 34.0 pg   MCHC 34.4 30.0 - 36.0 g/dL   RDW 13.6 11.5 - 15.5 %   Platelets 216 150 - 400 K/uL  Glucose, capillary     Status: Abnormal   Collection Time: 11/21/14  6:38 AM  Result Value Ref Range   Glucose-Capillary 106 (H) 70 - 99 mg/dL   No results found.  Review of Systems  Constitutional: Negative.   HENT: Negative.   Eyes: Negative.   Respiratory: Negative.   Cardiovascular:       See  HPI  Gastrointestinal: Negative.   Musculoskeletal:       See HPI  Skin: Negative.   Neurological: Negative.   Endo/Heme/Allergies: Negative.     Blood pressure 135/72, pulse 70, temperature 98.7 F (37.1 C), temperature source Oral, resp. rate 16, height 5' 9"  (1.753 m), weight 88.905 kg (  196 lb), SpO2 99 %. Physical Exam  Evaluation of the right upper extremity shows that he is placed into a splint has loss of ring finger extension and weakness with middle finger extension testing to resisted testing. Patient Ruthell Rummage refill are intact. Marland Kitchen.The patient is alert and oriented in no acute distress the patient complains of pain in the affected upper extremity.  The patient is noted to have a normal HEENT exam.  Lung fields show equal chest expansion and no shortness of breath  abdomen exam is nontender without distention.  Lower extremity examination does not show any fracture dislocation or blood clot symptoms.  Pelvis is stable neck and back are stable and nontender Assessment/Plan Right hand laceration with loss of extensor function to the ring finger and probable partial laceration to the middle finger extensor mechanism. Patient Active Problem List   Diagnosis Date Noted  . Subclavian steal syndrome 08/11/2013  . PVD (peripheral vascular disease) 03/31/2013  . Numbness and tingling of right arm 03/31/2013  . Pain in joint, upper arm 03/31/2013  . Peripheral vascular disease, unspecified 12/16/2012  . Occlusion and stenosis of carotid artery without mention of cerebral infarction 08/26/2012  . Chronic total occlusion of artery of the extremities 02/26/2012  . PAD (peripheral artery disease) 01/24/2012  . Ischemic cardiomyopathy 01/24/2012  . Cardiomyopathy secondary 12/26/2011  . Claudication 12/26/2011  . Mixed hyperlipidemia 12/14/2011  . Acute myocardial infarction of other anterior wall, subsequent episode of care 12/12/2011  . Tobacco abuse   . Coronary atherosclerosis of  native coronary artery   ... Marland Kitchen.We are planning surgery for your upper extremity. The risk and benefits of surgery include risk of bleeding infection anesthesia damage to normal structures and failure of the surgery to accomplish its intended goals of relieving symptoms and restoring function with this in mind we'll going to proceed. I have specifically discussed with the patient the pre-and postoperative regime and the does and don'ts and risk and benefits in great detail. Risk and benefits of surgery also include risk of dystrophy chronic nerve pain failure of the healing process to go onto completion and other inherent risks of surgery The relavent the pathophysiology of the disease/injury process, as well as the alternatives for treatment and postoperative course of action has been discussed in great detail with the patient who desires to proceed.  We will do everything in our power to help you (the patient) restore function to the upper extremity. Is a pleasure to see this patient today.  Conrad Zajkowski L 11/21/2014, 7:48 AM

## 2014-11-21 NOTE — Transfer of Care (Signed)
Immediate Anesthesia Transfer of Care Note  Patient: Ryan [name]  Procedure(s) Performed: Procedure(s): IRRIGATION AND DEBRIDEMENT RIGHT HAND (Right) WITH REPAIR/RECONSTRUCTION OF EXTENSOR TENDONS AS NEEDED (Right)  Patient Location: PACU  Anesthesia Type:General  Level of Consciousness: awake, alert  and oriented  Airway & Oxygen Therapy: Patient Spontanous Breathing  Post-op Assessment: Report given to PACU RN  Post vital signs: Reviewed and stable  Complications: No apparent anesthesia complications

## 2014-11-21 NOTE — Anesthesia Postprocedure Evaluation (Signed)
Anesthesia Post Note  Patient: Ryan [name]  Procedure(s) Performed: Procedure(s) (LRB): IRRIGATION AND DEBRIDEMENT RIGHT HAND (Right) WITH REPAIR/RECONSTRUCTION OF EXTENSOR TENDONS AS NEEDED (Right)  Anesthesia type: general  Patient location: PACU  Post pain: Pain level controlled  Post assessment: Patient's Cardiovascular Status Stable  Last Vitals:  Filed Vitals:   11/21/14 0915  BP: 147/88  Pulse: 74  Temp:   Resp: 21    Post vital signs: Reviewed and stable  Level of consciousness: sedated  Complications: No apparent anesthesia complications

## 2014-11-21 NOTE — Op Note (Signed)
See PRFFMBWGY#[id number] Amanda Pea MD

## 2014-11-21 NOTE — Anesthesia Preprocedure Evaluation (Addendum)
Anesthesia Evaluation  Patient identified by MRN, date of birth, ID band Patient awake    Reviewed: Allergy & Precautions, H&P , Patient's Chart, lab work & pertinent test results, reviewed documented beta blocker date and time   Airway Mallampati: II  TM Distance: >3 FB Neck ROM: Full    Dental  (+) Edentulous Upper, Dental Advisory Given   Pulmonary COPDCurrent Smoker,    Pulmonary exam normal       Cardiovascular hypertension, + CAD, + Past MI and + Peripheral Vascular Disease  11/19/2014  Nuclear Med Background Indication for Stress Test: Evaluation for Ischemia, Stent Patency, and Surgical Clearance for (R)Hand surgery- Dominica Severin, MD on 11-24-2014 History: CAD, Stent,and 08-15-2012 Myocardial Perfusion Imaging-Small Apical Ischemia,EF=50%, Low risk   Neuro/Psych negative neurological ROS  negative psych ROS   GI/Hepatic Neg liver ROS, GERD-  ,  Endo/Other  diabetes  Renal/GU negative Renal ROS     Musculoskeletal   Abdominal   Peds  Hematology   Anesthesia Other Findings   Reproductive/Obstetrics                            Anesthesia Physical Anesthesia Plan  ASA: III  Anesthesia Plan: General   Post-op Pain Management:    Induction: Intravenous  Airway Management Planned: LMA  Additional Equipment:   Intra-op Plan:   Post-operative Plan: Extubation in OR  Informed Consent: I have reviewed the patients History and Physical, chart, labs and discussed the procedure including the risks, benefits and alternatives for the proposed anesthesia with the patient or authorized representative who has indicated his/her understanding and acceptance.   Dental advisory given  Plan Discussed with: CRNA, Anesthesiologist and Surgeon  Anesthesia Plan Comments:         Anesthesia Quick Evaluation

## 2014-11-23 NOTE — Op Note (Signed)
NAMEMD, GORY NO.:  [id number]  MEDICAL RECORD NO.:  [id number]  LOCATION:                                 FACILITY:  PHYSICIAN:  Dionne Ano. Lyah Millirons, M.D.DATE OF BIRTH:  1966/11/08  DATE OF PROCEDURE:  11/21/2014 DATE OF DISCHARGE:  11/21/2014                              OPERATIVE REPORT   PREOPERATIVE DIAGNOSIS:  Laceration, dorsal aspect, right hand and wrist.  POSTOPERATIVE DIAGNOSIS:  Laceration, dorsal aspect, right hand and wrist.  PROCEDURE: 1. Irrigation and debridement of skin, subcutaneous tissue, tendon,     periosteal tissue, and bone, this was an excisional debridement. 2. Repair extensor digitorum communis to the ring finger with a     FiberWire 4-strand technique. 3. Repair extensor digitorum communis to the small finger with a 4-     strand FiberWire technique. 4. Dorsal sensory nerve evaluation and neurolysis.  SURGEON:  Dionne Ano. Amanda Pea, MD  ASSISTANT:  Karie Chimera, PA-C  COMPLICATION:  None.  ANESTHESIA:  General, preoperative block.  TOURNIQUET TIME:  Less than 30 minutes.  INDICATIONS:  This patient is a 48 year old male who has obtained verbal clearance from a cardiac status by myself from his position.  Dr. Krista Blue has provided a block, he has been off his anticoagulants and stable for surgical evaluation in the operative theater.  I have discussed with him risks and benefits, do's and don'ts.  He has a laceration with loss of function in terms of extension about his fingers and desires to proceed the above-mentioned operative intervention, exploration, and reconstruction.  OPERATION IN DETAIL:  The patient was seen by myself and Anesthesia, taken to the operative suite, underwent smooth induction of general anesthesia, laid supine, fully padded, prepped and draped in a sterile fashion with Betadine scrub and paint.  Following this, time-out was called.  Pre and postop check was complete.  Preoperative  antibiotics were given and the patient then underwent an elliptical incision removing 2-3 mm of skin on both sides of his prior laceration which had been sutured in an outside facility/ER.  I dissected down, very carefully, excised the skin and subcu.  Following this, we then performed irrigation of the area.  Once the irrigation was accomplished and we performed debridement of tendon, periosteum, and bone.  We then placed 3 L through and through the area.  This was an excisional debridement with 3 L placed in the wound and aggressive I and D performed.  Patient tolerated this well.  There were no complicating features.  Following this, the patient then underwent continued very careful and cautious debridement of the tissue followed by a dorsal sensory nerve branch neurolysis.  I looked at the dorsal sensory nerve branch freed it from scar tissue and performed a limited neurolysis.  Following this, I then identified the extensor digiti minimi to the small finger which was intact.  Following this, we then performed a 4 strand repair of the extensor digitorum communis to the small finger.  We were able to coapt the ends nicely.  The tendon was of good quality.  Following this, we then performed a 4 strand extensor digitorum communis repair to the ring finger EDC  with 4-strand FiberWire technique.  The tendon quality was good.  Following this, we deflated the tourniquet, irrigated additionally and closed the wound with Prolene.  The wound closed nicely, refill was excellent.  Compartments were soft.  He was placed in a sterile dressing and a fiberglass cast, keeping the wrist and fingers in slight extension, of course.  We will see him back in 12 to 14 days, continue casting, remove his sutures; at 4 weeks, we will begin active range of motion 50%; and at 8 weeks, full range of motion attempts with strengthening.  Thus immobilization x4 weeks, between weeks 4 and 6, 50% motion;  and between 6 and 8, try to give full motion; after 8 weeks strengthening.  I have discussed with him these issues at length and the do's and don'ts, etc.  Should any problems arise, we will be immediately available.  I do have some concerns in regard to his pain management and other issues.  We will watch this carefully.  I have discussed with him the relevancy of being very attention to detail during the postop.  It is pleasure to see him today and participate in his care.  We look forward participating in his postop recovery.     Dionne AnoWilliam M. Amanda PeaGramig, M.D.     [facility]derWMG/MEDQ  D:  11/21/2014  T:  11/21/2014  Job:  [id number]

## 2014-11-24 ENCOUNTER — Encounter (HOSPITAL_COMMUNITY): Payer: Self-pay | Admitting: Orthopedic Surgery

## 2014-11-30 ENCOUNTER — Other Ambulatory Visit: Payer: Self-pay | Admitting: Cardiovascular Disease

## 2014-12-03 ENCOUNTER — Telehealth: Payer: Self-pay | Admitting: *Deleted

## 2014-12-03 NOTE — Telephone Encounter (Signed)
Received lab results from research. Lipids not well controlled. Per Dr. Rosanna Randy and see if pt taking Zocor. If he is will need to change to Lipitor  80 mg daily. I placed call to number listed for pt and left message to call back

## 2014-12-11 NOTE — Telephone Encounter (Signed)
Left another message to call office

## 2014-12-14 ENCOUNTER — Encounter: Payer: Self-pay | Admitting: *Deleted

## 2014-12-14 NOTE — Telephone Encounter (Signed)
Letter sent to pt asking him to call office to discuss recent lab results.

## 2014-12-21 ENCOUNTER — Encounter: Payer: Self-pay | Admitting: Cardiovascular Disease

## 2014-12-22 ENCOUNTER — Ambulatory Visit (HOSPITAL_COMMUNITY)
Admission: RE | Admit: 2014-12-22 | Discharge: 2014-12-22 | Disposition: A | Discharge status: Home / Self Care | Payer: Medicaid Other | Source: Ambulatory Visit | Attending: Orthopedic Surgery | Admitting: Orthopedic Surgery

## 2014-12-22 ENCOUNTER — Encounter (HOSPITAL_COMMUNITY): Payer: Self-pay | Admitting: Specialist

## 2014-12-22 DIAGNOSIS — M25631 Stiffness of right wrist, not elsewhere classified: Secondary | ICD-10-CM | POA: Insufficient documentation

## 2014-12-22 DIAGNOSIS — S66821S Laceration of other specified muscles, fascia and tendons at wrist and hand level, right hand, sequela: Secondary | ICD-10-CM

## 2014-12-22 DIAGNOSIS — R531 Weakness: Secondary | ICD-10-CM | POA: Diagnosis not present

## 2014-12-22 DIAGNOSIS — S61411S Laceration without foreign body of right hand, sequela: Secondary | ICD-10-CM | POA: Diagnosis not present

## 2014-12-22 DIAGNOSIS — M25531 Pain in right wrist: Secondary | ICD-10-CM

## 2014-12-22 DIAGNOSIS — S61401S Unspecified open wound of right hand, sequela: Secondary | ICD-10-CM

## 2014-12-22 DIAGNOSIS — M25639 Stiffness of unspecified wrist, not elsewhere classified: Secondary | ICD-10-CM

## 2014-12-22 NOTE — Patient Instructions (Signed)
Your Splint This splint should initially be fitted by a healthcare practitioner.  The healthcare practitioner is responsible for providing wearing instructions and precautions to the patient, other healthcare practitioners and care provider involved in the patient's care.  This splint was custom made for you. Please read the following instructions to learn about wearing and caring for your splint.  Precautions Should your splint cause any of the following problems, remove the splint immediately and contact your therapist/physician.  Swelling  Severe Pain  Pressure Areas  Stiffness  Numbness  Do not wear your splint while operating machinery unless it has been fabricated for that purpose.  When To Wear Your Splint Where your splint according to your therapist/physician instructions. All the time removing for HEP 3 times per day.  Care and Cleaning of Your Splint 1. Keep your splint away from open flames. 2. Your splint will lose its shape in temperatures over 135 degrees Farenheit, ( in car windows, near radiators, ovens or in hot water).  Never make any adjustments to your splint, if the splint needs adjusting remove it and make an appointment to see your therapist. 3. Your splint, including the cushion liner may be cleaned with soap and lukewarm water.  Do not immerse in hot water over 135 degrees Farenheit. 4. Straps may be washed with soap and water, but do not moisten the self-adhesive portion. 5. For ink or hard to remove spots use a scouring cleanser which contains chlorine.  Rinse the splint thoroughly after using chlorine cleanser. AROM: DIP Flexion / Extension   Pinch middle knuckle of ________ finger of right hand to prevent bending. Bend end knuckle until stretch is felt. Hold ____ seconds. Relax. Straighten finger as far as possible. Repeat ____ times per set. Do ____ sets per session. Do ____ sessions per day.  Copyright  VHI. All rights reserved.    AROM: PIP  Flexion / Extension   Pinch bottom knuckle of ________ finger of right hand to prevent bending. Actively bend middle knuckle until stretch is felt. Hold ____ seconds. Relax. Straighten finger as far as possible. Repeat ____ times per set. Do ____ sets per session. Do ____ sessions per day.  Copyright  VHI. All rights reserved.   AROM: Finger Flexion / Extension   Actively bend fingers of right hand. Start with knuckles furthest from palm, and slowly make a fist. Hold ____ seconds. Relax. Then straighten fingers as far as possible. Repeat ____ times per set. Do ____ sets per session. Do ____ sessions per day.  Copyright  VHI. All rights reserved.

## 2014-12-22 NOTE — Addendum Note (Signed)
Encounter addended by: Jacqualine Code, OTR on: 12/22/2014  5:13 PM<BR>     Documentation filed: Flowsheet VN

## 2014-12-22 NOTE — Therapy (Addendum)
[place], [place], [address] Phone: [phone]   Fax:  [phone]  Occupational Therapy Evaluation  Patient Details  Name: Ryan [name] MRN: [id number] Date of Birth: Jan 07, 1966 Referring Provider:  Roseanne Kaufman, MD  Encounter Date: 12/22/2014      OT End of Session - 12/22/14 1641    Visit Number 1   Number of Visits 4   Date for OT Re-Evaluation 02/20/15   Authorization Type Medicaid, eval plus 3 visits   Authorization Time Period through 02/20/2015   Authorization - Visit Number 1   Authorization - Number of Visits 4   OT Start Time 3244   OT Stop Time 1435   OT Time Calculation (min) 40 min   Activity Tolerance Patient tolerated treatment well   Behavior During Therapy [facility] for tasks assessed/performed      Past Medical History  Diagnosis Date  . Anal fissure   . Rectal polyp   . Mixed hyperlipidemia     takes Simvastatin daily  . PAD (peripheral artery disease)   . GERD (gastroesophageal reflux disease)     "takes tums"  . Hypertension     takes Enalapril and Coreg daily   . Coronary atherosclerosis of native coronary artery     takes Effient but on hold for surgery  . MI (myocardial infarction)     AMI 0/10 - complicated by VT/Tosades  . Bruises easily     d/t being on Effient  . History of kidney stones   . Diabetes mellitus without complication     takes Lantus daily  . History of blood transfusion     no abnormal reaction noted    Past Surgical History  Procedure Laterality Date  . Appendectomy    . Diagnostic laparoscopy    . Anal fissurectomy    . Coronary angioplasty      stent placed Dec 12, 2011  . Aorta - bilateral femoral artery bypass graft N/A 01/16/2013    Procedure: AORTA BIFEMORAL BYPASS GRAFT;  Surgeon: Serafina Mitchell, MD;  Location: [place];  Service: Vascular;  Laterality: N/A;  . Colonoscopy    . Carotid-subclavian bypass graft Left 04/03/2013    Procedure:  BYPASS GRAFT CAROTID-SUBCLAVIAN;  Surgeon: Serafina Mitchell, MD;  Location: [facility] OR;  Service: Vascular;  Laterality: Left;  . I&d extremity Left 04/18/2013    Procedure: debridement of left neck lymphocele;  Surgeon: Serafina Mitchell, MD;  Location: [facility] OR;  Service: Vascular;  Laterality: Left;  I and D of left neck  . Left heart catheterization with coronary angiogram N/A 12/12/2011    Procedure: LEFT HEART CATHETERIZATION WITH CORONARY ANGIOGRAM;  Surgeon: Burnell Blanks, MD;  Location: [facility] CATH LAB;  Service: Cardiovascular;  Laterality: N/A;  . Percutaneous coronary stent intervention (pci-s) Right 12/12/2011    Procedure: PERCUTANEOUS CORONARY STENT INTERVENTION (PCI-S);  Surgeon: Burnell Blanks, MD;  Location: [facility] CATH LAB;  Service: Cardiovascular;  Laterality: Right;  . Lower extremity angiogram N/A 01/31/2012    Procedure: LOWER EXTREMITY ANGIOGRAM;  Surgeon: Burnell Blanks, MD;  Location: [facility] CATH LAB;  Service: Cardiovascular;  Laterality: N/A;  . I&d extremity Right 11/21/2014    Procedure: IRRIGATION AND DEBRIDEMENT RIGHT HAND;  Surgeon: Roseanne Kaufman, MD;  Location: [place];  Service: Orthopedics;  Laterality: Right;  . Repair extensor tendon Right 11/21/2014    Procedure: WITH REPAIR/RECONSTRUCTION OF EXTENSOR TENDONS AS NEEDED;  Surgeon: Roseanne Kaufman, MD;  Location: [facility]  OR;  Service: Orthopedics;  Laterality: Right;    There were no vitals taken for this visit.  Visit Diagnosis:  Extensor tendon laceration of hand with open wound, right, sequela  Wrist pain, acute, right  Decreased range of motion of wrist      Subjective Assessment - 12/22/14 1625    Symptoms S:  I cut my hand while working on my brakes.   Pertinent History Approximately 6 weeks ago, patient lacerated his right hand dorsal surface.  He later consulted with Dr. Amedeo Plenty and was diagnosed with a lacerated extensor digitorum communis of the right 4th and 5th digit.  He had surgery to repair the  tendons approximately 4 weeks ago.  He was placed in a cast and then upgraded to a 1/2 cast yesterday.  He has been referred to occupational therapy for evaluation and treatment and splint fabrication.     Limitations Splint and ROM for 2 weeks (01/05/15) and then remove splint 8 times per day for 15 minutes at a time and complete AROM.     Patient Stated Goals I want to get the bend back in my fingers.   Currently in Pain? Yes   Pain Score 7    Pain Location Hand   Pain Orientation Right   Pain Descriptors / Indicators Constant   Pain Type Acute pain   Pain Onset More than a month ago   Pain Frequency Intermittent          [facility] OT Assessment - 12/22/14 1628    Assessment   Diagnosis S/P Repair of Lacerated Right Extensor Digitorum Communis Tendon to 4th and 5th Digit   Onset Date 11/04/15   Prior Therapy n/a   Precautions   Precautions Other (comment)  splint at all times for 2 weeks, AROM 3 times per day    Required Braces or Orthoses Other Brace/Splint  right resting hand splint.   Restrictions   Weight Bearing Restrictions No   Balance Screen   Has the patient fallen in the past 6 months No   Home  Environment   Family/patient expects to be discharged to: Private residence   Lives With Family   Prior Function   Level of Independence --  Independent with B/IADLs   Vocation On disability   Leisure enjoys fishing   ADL   ADL comments unable to use right hand with ADLs, using left hand as dominant.  unable to complete bilateral hand activities.   Written Expression   Dominant Hand Right   Vision - History   Baseline Vision No visual deficits   Cognition   Overall Cognitive Status Within Functional Limits for tasks assessed   Observation/Other Assessments   Skin Integrity surgical incisions healed with minimal scar restrictions   Outcome Measures clinical judgement RUE use severely limited   Sensation   Light Touch Appears Intact   Coordination   Gross Motor Movements  are Fluid and Coordinated Yes   Fine Motor Movements are Fluid and Coordinated No   Palpation   Palpation moderate stiffness   AROM   Overall AROM Comments assessed in seated    Right Wrist Extension 44 Degrees   Right Composite Finger Extension --  extension is WFL   Right Composite Finger Flexion 50%   Hand Function   Comments Right MCPJ flexion:  thumb WFL, Index 62, long 60, ring 60, small 52 PIPJ flexion thumb WFL, index, long, ring 70, small 60 DIPJ index 40, long and ring 60, small 61  OT Treatments/Exercises (OP) - 12/22/14 1637    Splinting   Splinting fabricated volar based resting hand splint positioning wrist in 10 degrees of extension and MCPJ at 0, PIPJ and DIPJ free.  Patient was educated on donning and doffing the splint, to wear at all times removing to complete AROM HEP 3 -5 times per day.  Patient voiced understanding.               OT Education - 12/22/14 1640    Education provided Yes   Education Details splint wear and care, hand and wrist AROM gentle, within available range.    Person(s) Educated Patient   Methods Explanation;Demonstration;Handout   Comprehension Verbalized understanding;Returned demonstration          OT Short Term Goals - 12/22/14 1646    OT SHORT TERM GOAL #1   Title Patient will be educated on a HEP.   Baseline Educated on HEP this date for wrist and hand AROM and splint wear and care.   Time 4   Period Weeks   Status New   OT SHORT TERM GOAL #2   Title Patient will improve AROM in right wrist and hand to [facility] for use of right hand as dominant with B/IADLs.   Time 4   Period Weeks   Status New   OT SHORT TERM GOAL #3   Title Patient will have WFL grip and pinch strength in his right hand in order to complete daily activities.    Time 4   Period Weeks   Status New   OT SHORT TERM GOAL #4   Title Patient will decrease pain and fascial restrictions to minimal in his right hand and wrist.   Time 4    Period Weeks   Status New                  Plan - 12/22/14 1642    Clinical Impression Statement A:  Patient presents s/p repair of lacerated Right extensor digitorum communis tendon of 4th and 5th digit.  Patient was evaluated and a resting hand splint was fabricated per MD order.  Patient will benefit from skilled OT intervention in order to improve on the following deficits:  decreased AROM, PROM, strength, coordination, and functional use of his right hand, as well as increased tightness and pain.     Rehab Potential Good   OT Frequency 1x / week   OT Duration 4 weeks   OT Treatment/Interventions Self-care/ADL training;Neuromuscular education;Passive range of motion;Scar mobilization;Manual Therapy;Splinting;Therapeutic exercises;Therapeutic activities;Patient/family education   Plan P:  Follow up on HEP, begin AAROM, avoid PROM, begin MFR.     Consulted and Agree with Plan of Care Patient        Problem List Patient Active Problem List   Diagnosis Date Noted  . Subclavian steal syndrome 08/11/2013  . PVD (peripheral vascular disease) 03/31/2013  . Numbness and tingling of right arm 03/31/2013  . Pain in joint, upper arm 03/31/2013  . Peripheral vascular disease, unspecified 12/16/2012  . Occlusion and stenosis of carotid artery without mention of cerebral infarction 08/26/2012  . Chronic total occlusion of artery of the extremities 02/26/2012  . PAD (peripheral artery disease) 01/24/2012  . Ischemic cardiomyopathy 01/24/2012  . Cardiomyopathy secondary 12/26/2011  . Claudication 12/26/2011  . Mixed hyperlipidemia 12/14/2011  . Acute myocardial infarction of other anterior wall, subsequent episode of care 12/12/2011  . Tobacco abuse   . Coronary atherosclerosis of native coronary artery     [facility]  Ola Spurr, OTR/L [phone]  12/22/2014, 4:51 PM  [place] [address] [place], [place], [address] Phone:  [phone]   Fax:  [phone]    OCCUPATIONAL THERAPY DISCHARGE SUMMARY  Visits from Start of Care: 1  Current functional level related to goals / functional outcomes: unknown   Remaining deficits: unknown   Education / Equipment: unknown  Plan: Patient agrees to discharge.  Patient goals were not met. Patient is being discharged due to not returning since the last visit.  ?????       Vangie Bicker, OTR/L [phone]

## 2014-12-28 ENCOUNTER — Ambulatory Visit (HOSPITAL_COMMUNITY): Payer: Medicaid Other | Admitting: Specialist

## 2015-01-04 ENCOUNTER — Ambulatory Visit (HOSPITAL_COMMUNITY): Payer: Medicaid Other | Attending: Orthopedic Surgery | Admitting: Specialist

## 2015-01-12 ENCOUNTER — Ambulatory Visit (HOSPITAL_COMMUNITY): Payer: Medicaid Other | Admitting: Specialist

## 2015-01-15 NOTE — Telephone Encounter (Signed)
Pt has not returned call or responded to letter. He has office visit scheduled with Dr.McAlhany on February 03, 2015.  Will close this phone note and address at office visit

## 2015-02-03 ENCOUNTER — Other Ambulatory Visit: Payer: Medicaid Other

## 2015-02-03 ENCOUNTER — Ambulatory Visit: Payer: Medicaid Other | Admitting: Cardiovascular Disease

## 2015-09-20 ENCOUNTER — Ambulatory Visit: Payer: Medicaid Other | Admitting: Cardiovascular Disease

## 2015-09-24 ENCOUNTER — Telehealth: Payer: Self-pay

## 2015-09-24 ENCOUNTER — Encounter: Payer: Self-pay | Admitting: Cardiovascular Disease

## 2015-09-24 ENCOUNTER — Ambulatory Visit (INDEPENDENT_AMBULATORY_CARE_PROVIDER_SITE_OTHER): Payer: Medicaid Other | Admitting: Cardiovascular Disease

## 2015-09-24 VITALS — BP 96/60 | HR 84 | Ht 69.0 in | Wt 213.0 lb

## 2015-09-24 DIAGNOSIS — I25812 Atherosclerosis of bypass graft of coronary artery of transplanted heart without angina pectoris: Secondary | ICD-10-CM | POA: Diagnosis not present

## 2015-09-24 DIAGNOSIS — I739 Peripheral vascular disease, unspecified: Secondary | ICD-10-CM

## 2015-09-24 DIAGNOSIS — I2510 Atherosclerotic heart disease of native coronary artery without angina pectoris: Secondary | ICD-10-CM

## 2015-09-24 DIAGNOSIS — Z720 Tobacco use: Secondary | ICD-10-CM

## 2015-09-24 MED ORDER — VARENICLINE TARTRATE 0.5 MG X 11 & 1 MG X 42 PO MISC: ORAL | Status: DC

## 2015-09-24 MED ORDER — SPIRONOLACTONE 25 MG PO TABS: 12.5000 mg | ORAL_TABLET | Freq: Every day | ORAL | Status: DC

## 2015-09-24 MED ORDER — NITROGLYCERIN 0.4 MG SL SUBL: 0.4000 mg | SUBLINGUAL_TABLET | SUBLINGUAL | Status: DC | PRN

## 2015-09-24 MED ORDER — VARENICLINE TARTRATE 1 MG PO TABS: ORAL_TABLET | ORAL | Status: DC

## 2015-09-24 NOTE — Patient Instructions (Signed)
Medication Instructions:  Your physician has recommended you make the following change in your medication: Start Chantix.  Follow label instructions.    Labwork: none  Testing/Procedures: none  Follow-Up: Your physician wants you to follow-up in: 12 months.  You will receive a reminder letter in the mail two months in advance. If you don't receive a letter, please call our office to schedule the follow-up appointment.

## 2015-09-24 NOTE — Addendum Note (Signed)
Addended by: Dossie Arbour on: 09/24/2015 10:40 AM   Modules accepted: Orders

## 2015-09-24 NOTE — Progress Notes (Signed)
Chief Complaint  Patient presents with  . Follow-up    CAD      History of Present Illness: 49 yo WM with history of ongoing tobacco abuse, CAD with anterior STEMI January 2013 s/p DES LAD x 1, HLD, PAD here today for cardiac f/u. He presented to the [facility] ED on 12/12/11 with c/o chest pain. He was found to have anterior ST segment elevation and had cardiac arrest in the [facility] ED. He was transported emergently to [facility] where I met him in the cath lab. Cath showed subtotal occlusion of the mid LAD. This was treated with a PTCA and placement of a 2.75 x 32 mm Promus Element DES and post-dilated with a 3.0 mm balloon. The third OM branch was chronically occluded and the RCA had mild disease. His post MI LVEF was 35%. He did well post MI and was discharged home with a LifeVest. I saw him 01/24/12 for follow up and he had c/o pain in both legs with walking. Non-invasive imaging with ABI of 0.53 on the right and 0.45 on the left with suggestion of aortoiliac inflow disease. There also appeared to be left subclavian artery stenosis on non-invasive imaging. I arranged a distal aortogram with bilateral lower ext runoff and left subclavian angiography on 01/31/12. His distal aorta was occluded just distal to the renals. His left subclavian was occluded at the ostium. I reviewed his films with vascular surgery but he was not a good candidate for aorto-bifemoral bypass at that time since he is on Effient. Follow up echo 02/16/12 with normal LV function. His Lifevest was d/c'ed. He was seen by Dr. Annamarie Major in [facility] clinic and had aorto-bifemoral bypass surgery in February 2014 and left carotid to subclavian artery bypass May 2014. He is still followed in [facility] by Dr. Azzie Almas [facility] December 2015 with no ischemia, prior to hand surgery.    He is here today for follow up. He has been having more frequent chest pains with exertion. He has no energy. No change in breathing. He is smoking 10 cigarettes  per day. Chronic left leg pain and numbness. He has been dismissed from the pain clinic due to refusing to take drug screen.   Primary Care Physician: Annamarie Major   Past Medical History  Diagnosis Date  . Anal fissure   . Rectal polyp   . Mixed hyperlipidemia     takes Simvastatin daily  . PAD (peripheral artery disease) ([place])   . GERD (gastroesophageal reflux disease)     "takes tums"  . Hypertension     takes Enalapril and Coreg daily   . Coronary atherosclerosis of native coronary artery     takes Effient but on hold for surgery  . MI (myocardial infarction) ([place])     AMI 5/09 - complicated by VT/Tosades  . Bruises easily     d/t being on Effient  . History of kidney stones   . Diabetes mellitus without complication ([place])     takes Lantus daily  . History of blood transfusion     no abnormal reaction noted    Past Surgical History  Procedure Laterality Date  . Appendectomy    . Diagnostic laparoscopy    . Anal fissurectomy    . Coronary angioplasty      stent placed Dec 12, 2011  . Aorta - bilateral femoral artery bypass graft N/A 01/16/2013    Procedure: AORTA BIFEMORAL BYPASS GRAFT;  Surgeon: Serafina Mitchell, MD;  [facility] OR;  Service: Vascular;  Laterality: N/A;  . Colonoscopy    . Carotid-subclavian bypass graft Left 04/03/2013    Procedure: BYPASS GRAFT CAROTID-SUBCLAVIAN;  Surgeon: Serafina Mitchell, MD;  Location: [facility] OR;  Service: Vascular;  Laterality: Left;  . I&d extremity Left 04/18/2013    Procedure: debridement of left neck lymphocele;  Surgeon: Serafina Mitchell, MD;  Location: [facility] OR;  Service: Vascular;  Laterality: Left;  I and D of left neck  . Left heart catheterization with coronary angiogram N/A 12/12/2011    Procedure: LEFT HEART CATHETERIZATION WITH CORONARY ANGIOGRAM;  Surgeon: Burnell Blanks, MD;  Location: [facility] CATH LAB;  Service: Cardiovascular;  Laterality: N/A;  . Percutaneous coronary stent intervention (pci-s) Right 12/12/2011    Procedure:  PERCUTANEOUS CORONARY STENT INTERVENTION (PCI-S);  Surgeon: Burnell Blanks, MD;  Location: [facility] CATH LAB;  Service: Cardiovascular;  Laterality: Right;  . Lower extremity angiogram N/A 01/31/2012    Procedure: LOWER EXTREMITY ANGIOGRAM;  Surgeon: Burnell Blanks, MD;  Location: [facility] CATH LAB;  Service: Cardiovascular;  Laterality: N/A;  . I&d extremity Right 11/21/2014    Procedure: IRRIGATION AND DEBRIDEMENT RIGHT HAND;  Surgeon: Roseanne Kaufman, MD;  Location: [place];  Service: Orthopedics;  Laterality: Right;  . Repair extensor tendon Right 11/21/2014    Procedure: WITH REPAIR/RECONSTRUCTION OF EXTENSOR TENDONS AS NEEDED;  Surgeon: Roseanne Kaufman, MD;  Location: [place];  Service: Orthopedics;  Laterality: Right;    Current Outpatient Prescriptions  Medication Sig Dispense Refill  . aspirin EC 81 MG tablet Take 162 mg by mouth daily.    . carvedilol (COREG) 3.125 MG tablet TAKE ONE TABLET BY MOUTH TWICE A DAY WITH MEALS 60 tablet 2  . clopidogrel (PLAVIX) 75 MG tablet TAKE ONE TABLET BY MOUTH ONE TIME DAILY 30 tablet 2  . enalapril (VASOTEC) 5 MG tablet TAKE ONE TABLET BY MOUTH TWICE DAILY 60 tablet 2  . Insulin Glargine (LANTUS SOLOSTAR) 100 UNIT/ML Solostar Pen Inject 35 Units into the skin at bedtime.    . isosorbide mononitrate (IMDUR) 30 MG 24 hr tablet TAKE ONE TABLET BY MOUTH ONE TIME DAILY 30 tablet 2  . nitroGLYCERIN (NITROSTAT) 0.4 MG SL tablet Place 1 tablet (0.4 mg total) under the tongue every 5 (five) minutes as needed for chest pain. 25 tablet 6  . oxyCODONE (ROXICODONE) 5 MG immediate release tablet Take 1 tablet (5 mg total) by mouth every 4 (four) hours as needed for severe pain. 40 tablet 0  . simvastatin (ZOCOR) 40 MG tablet Take 40 mg by mouth at bedtime.    Marland Kitchen spironolactone (ALDACTONE) 25 MG tablet Take 12.5 mg by mouth daily.     No current facility-administered medications for this visit.    Allergies  Allergen Reactions  . Hydrocodone Nausea And Vomiting      Social History   Social History  . Marital Status: Divorced    Spouse Name: N/A  . Number of Children: N/A  . Years of Education: N/A   Occupational History  .  Other   Social History Main Topics  . Smoking status: Current Every Day Smoker -- 0.25 packs/day for 25 years    Types: Cigarettes  . Smokeless tobacco: Never Used  . Alcohol Use: 0.0 oz/week     Comment: occasional   . Drug Use: No  . Sexual Activity: Yes   Other Topics Concern  . Not on file   Social History Narrative   [employer] - physical  labor  - works night shift          Family History  Problem Relation Age of Onset  . Cancer Father     Lung  . Cancer Maternal Uncle     Colon  . Diabetes Mother   . COPD Mother     Review of Systems:  As stated in the HPI and otherwise negative.   BP 96/60 mmHg  Pulse 84  Ht 5' 9"  (1.753 m)  Wt 213 lb (96.616 kg)  BMI 31.44 kg/m2  SpO2 98%  Physical Examination: General: Well developed, well nourished, NAD HEENT: OP clear, mucus membranes moist SKIN: warm, dry. No rashes. Neuro: No focal deficits Musculoskeletal: Muscle strength 5/5 all ext Psychiatric: Mood and affect normal Neck: No JVD, no carotid bruits, no thyromegaly, no lymphadenopathy. Lungs:Clear bilaterally, no wheezes, rhonci, crackles Cardiovascular: Regular rate and rhythm. No murmurs, gallops or rubs. Abdomen:Soft. Bowel sounds present. Non-tender.  Extremities: No lower extremity edema. Pulses are 2 + in the bilateral DP/PT.  EKG:  EKG is ordered today. The ekg ordered today demonstrates NSR, rate 84 bpm. Inferior and anterolateral T wave inversions. Unchanged.   Recent Labs: 11/21/2014: BUN 13; Creatinine, Ser 0.96; Hemoglobin 14.4; Platelets 216; Potassium 4.0; Sodium 138   Lipid Panel    Component Value Date/Time   CHOL 237* 12/13/2011 0607   TRIG 329* 12/13/2011 0607   HDL 30* 12/13/2011 0607   CHOLHDL 7.9 12/13/2011 0607   VLDL 66* 12/13/2011 0607    LDLCALC 141* 12/13/2011 0607     Wt Readings from Last 3 Encounters:  09/24/15 213 lb (96.616 kg)  11/21/14 196 lb (88.905 kg)  11/19/14 196 lb (88.905 kg)     Other studies Reviewed: Additional studies/ records that were reviewed today include: . Review of the above records demonstrates:    Assessment and Plan:   1. PAD: Stable. S/p aorto-bifemoral bypass and left carotid to subclavian artery bypass. He needs to keep f/u with Dr. Trula Slade. He will call this week.   2. CAD: Stable. He is s/p anterior STEMI January 2013 with occluded LAD treated with bare metal stent. Stress myoview December 2015 without ischemia. He is having no chest pain. He has been on dual antiplatelet therapy with ASA and Plavix. Continue statin and beta blocker. Lipids are followed in primary care and per pt were checked this week. He has not heard the results yet.   3. Tobacco abuse: Complete cessation encouraged. He is trying to stop. I have spent ten minutes on counseling to stop. Will prescribe Chantix.   Current medicines are reviewed at length with the patient today.  The patient does not have concerns regarding medicines.  The following changes have been made:  no change  Labs/ tests ordered today include:  No orders of the defined types were placed in this encounter.    Disposition:   FU with me in 12  months  Signed, [name], MD 09/24/2015 10:03 AM    [facility] [place], [place], [place]  [address] Phone: [phone]; Fax: [phone]

## 2015-09-27 NOTE — Telephone Encounter (Signed)
Prior auth requested for patient's Chantix. I spoke with [facility] pharmacist, who said this should not be a requirement. She gave me different NDC codes to use for running this through his Medicaid.

## 2015-10-12 ENCOUNTER — Telehealth: Payer: Self-pay | Admitting: Cardiovascular Disease

## 2015-10-12 MED ORDER — CLOPIDOGREL BISULFATE 75 MG PO TABS: 75.0000 mg | ORAL_TABLET | Freq: Every day | ORAL | Status: DC

## 2015-10-12 MED ORDER — ISOSORBIDE MONONITRATE ER 30 MG PO TB24: 30.0000 mg | ORAL_TABLET | Freq: Every day | ORAL | Status: DC

## 2015-10-12 NOTE — Telephone Encounter (Signed)
Pt's Rx sent to pt's pharmacy.

## 2015-10-12 NOTE — Telephone Encounter (Signed)
New message       *STAT* If patient is at the pharmacy, call can be transferred to refill team.   1. Which medications need to be refilled? (please list name of each medication and dose if known) isosorbide 30mg  and generic plavix 75mg  2. Which pharmacy/location (including street and city if local pharmacy) is medication to be sent to? [facility] in [place]  3. Do they need a 30 day or 90 day supply? 90 day

## 2015-12-08 ENCOUNTER — Ambulatory Visit (INDEPENDENT_AMBULATORY_CARE_PROVIDER_SITE_OTHER): Payer: Medicaid Other | Admitting: Urology

## 2015-12-08 DIAGNOSIS — N476 Balanoposthitis: Secondary | ICD-10-CM | POA: Diagnosis not present

## 2015-12-08 DIAGNOSIS — N471 Phimosis: Secondary | ICD-10-CM

## 2015-12-10 ENCOUNTER — Encounter: Payer: Self-pay | Admitting: Family

## 2015-12-13 ENCOUNTER — Other Ambulatory Visit: Payer: Self-pay | Admitting: Urology

## 2015-12-13 NOTE — Patient Instructions (Addendum)
Ryan [name]  12/13/2015     @   Your procedure is scheduled on  12/15/2015   Report to [name] at  1115 A.M.  Call this number if you have problems the morning of surgery:  [phone]   Remember:  Do not eat food or drink liquids after midnight.  Take these medicines the morning of surgery with A SIP OF WATER  Coreg, vasotec, oxycodone. Take 1/2 of your usual insulin dosage the night before your surgery. DO NOT take any medicine for diabetes the morning of surgery.   Do not wear jewelry, make-up or nail polish.  Do not wear lotions, powders, or perfumes.  You may wear deodorant.  Do not shave 48 hours prior to surgery.  Men may shave face and neck.  Do not bring valuables to the hospital.  [facility] is not responsible for any belongings or valuables.  Contacts, dentures or bridgework may not be worn into surgery.  Leave your suitcase in the car.  After surgery it may be brought to your room.  For patients admitted to the hospital, discharge time will be determined by your treatment team.  Patients discharged the day of surgery will not be allowed to drive home.   Name and phone number of your driver:   family Special instructions:  none  Please read over the following fact sheets that you were given. Pain Booklet, Coughing and Deep Breathing, Surgical Site Infection Prevention, Anesthesia Post-op Instructions and Care and Recovery After Surgery      Circumcision, Adult Circumcision is a surgery to remove the foreskin of the penis or to cut the foreskin so the opening is larger. When only the foreskin is cut, it is called a dorsal (on top of the foreskin) incision. This procedure leaves the entire foreskin but makes the end of the foreskin looser so it can be pulled back over the head of the penis.  LET [facility] CARE PROVIDER KNOW ABOUT:  Any allergies you have.  All medicines you are taking, including vitamins, herbs, eye  drops, creams, and over the counter medicines.  Previous problems you or members of your family have had with the use of anesthetics.  Any blood disorders you have.  Previous surgeries you have had.  Medical conditions you have. RISKS AND COMPLICATIONS  Generally, circumcision is a safe procedure. However, as with any procedure, problems can occur. Possible problems include:  Bleeding.  Infection.  Pain.  Urethral injury. The urethra is the opening on the end of the penis that carries your urine out.  Breaking open of the surgical wound can occur from an unwanted erection after surgery. BEFORE THE PROCEDURE   Do not take aspirin or blood thinners for a week prior to surgery, or as your health care provider suggests.  Do not eat or drink anything after midnight the night before surgery, or as instructed.  Let your health care provider know if you develop a cold or other infection before surgery.  You should be present 1 hour before the procedure, or as directed.  Before the procedure, you will be washed and given a local anesthetic to make sure you feel no pain. PROCEDURE  An anesthetic will be given. When an anesthetic that just numbs the area of the procedure (local anesthetic) is used, the anesthetic will be injected with a needle into the skin of your penis to numb the nerves of  your foreskin. Your surgeon will remove the excess foreskin with a surgical knife. Absorbable sutures will be used to close the incision after the foreskin has been removed. The sutures will not need to be removed.  AFTER THE PROCEDURE A sterile dressing will be applied to the incision site. It may be difficult to keep the dressing in place. It is alright if the dressing comes off, especially at night. Air will help a scab to form which will eliminate the need for dressings during the day.   This information is not intended to replace advice given to you by your health care provider. Make sure you  discuss any questions you have with your health care provider.   Document Released: 12/10/2007 Document Revised: 12/11/2014 Document Reviewed: 04/22/2013 Elsevier Interactive Patient Education 2016 [employer]. Circumcision, Adult, Care After These instructions give you information on caring for yourself after your procedure. Your doctor may also give you more specific instructions. Call your doctor if you have any problems or questions after your procedure. HOME CARE  Only take medicine as told by your doctor.  Any bandages (dressings) should stay on for at least 24 hours.  You may take the bandages off at night to let air get to the area where your doctor made a cut (incision site). Once a scab forms over the cut, you will not need to use bandages.  Carefully remove the bandage if it gets dirty. Apply medicated cream to the cut. Carefully put a new bandage on if a scab has not formed.  Do not have sex until your doctor says it is okay.  Do not get your cut wet for 24 hours or as told by your doctor.  You may take a sponge bath. Clean around the cut gently with mild soap and water.  You may take a shower after 24 hours or as told by your doctor. Do not take a tub bath. After you shower, gently pat your cut dry. Do not rub it.  Avoid heavy lifting.  Avoid contact sports, biking, or swimming until you have healed. This usually takes 10-14 days. GET HELP IF:  You have pain that does not go away after you take medicine for it.  You have puffiness (swelling) or redness that is unexpected.  You have a fever. GET HELP RIGHT AWAY IF:   You cannot pee (urinate).  You have pain when you pee.  Your pain is not helped by medicines.  There is redness, puffiness, and soreness spreading up the shaft of your penis, your thighs, or your lower belly (abdomen).  There is yellowish-white fluid (pus) coming from your cut.  You have bleeding that does not stop when you press on it. MAKE  SURE YOU:  Understand these instructions.  Will watch your condition.  Will get help right away if you are not doing well or get worse.   This information is not intended to replace advice given to you by your health care provider. Make sure you discuss any questions you have with your health care provider.   Document Released: 05/08/2008 Document Revised: 12/11/2014 Document Reviewed: 07/17/2013 Elsevier Interactive Patient Education 2016 [employer]  PATIENT INSTRUCTIONS POST-ANESTHESIA  IMMEDIATELY FOLLOWING SURGERY:  Do not drive or operate machinery for the first twenty four hours after surgery.  Do not make any important decisions for twenty four hours after surgery or while taking narcotic pain medications or sedatives.  If you develop intractable nausea and vomiting or a severe headache please notify  your doctor immediately.  FOLLOW-UP:  Please make an appointment with your surgeon as instructed. You do not need to follow up with anesthesia unless specifically instructed to do so.  WOUND CARE INSTRUCTIONS (if applicable):  Keep a dry clean dressing on the anesthesia/puncture wound site if there is drainage.  Once the wound has quit draining you may leave it open to air.  Generally you should leave the bandage intact for twenty four hours unless there is drainage.  If the epidural site drains for more than 36-48 hours please call the anesthesia department.  QUESTIONS?:  Please feel free to call your physician or the hospital operator if you have any questions, and they will be happy to assist you.

## 2015-12-14 ENCOUNTER — Encounter (HOSPITAL_COMMUNITY): Admission: RE | Admit: 2015-12-14 | Payer: Medicaid Other | Source: Ambulatory Visit

## 2015-12-14 ENCOUNTER — Encounter (HOSPITAL_COMMUNITY)
Admission: RE | Admit: 2015-12-14 | Discharge: 2015-12-14 | Disposition: A | Discharge status: Home / Self Care | Payer: Medicaid Other | Source: Ambulatory Visit | Attending: Urology | Admitting: Urology

## 2015-12-14 ENCOUNTER — Encounter (HOSPITAL_COMMUNITY): Payer: Self-pay

## 2015-12-14 DIAGNOSIS — N476 Balanoposthitis: Secondary | ICD-10-CM | POA: Diagnosis not present

## 2015-12-14 DIAGNOSIS — N471 Phimosis: Secondary | ICD-10-CM | POA: Insufficient documentation

## 2015-12-14 DIAGNOSIS — Z01812 Encounter for preprocedural laboratory examination: Secondary | ICD-10-CM | POA: Insufficient documentation

## 2015-12-14 LAB — BASIC METABOLIC PANEL
ANION GAP: 10 (ref 5–15)
BUN: 15 mg/dL (ref 6–20)
CO2: 24 mmol/L (ref 22–32)
Calcium: 9 mg/dL (ref 8.9–10.3)
Chloride: 101 mmol/L (ref 101–111)
Creatinine, Ser: 0.94 mg/dL (ref 0.61–1.24)
GFR calc Af Amer: >60 mL/min (ref >60)
Glucose, Bld: 308 mg/dL — ABNORMAL HIGH (ref 65–99)
POTASSIUM: 4.2 mmol/L (ref 3.5–5.1)
SODIUM: 135 mmol/L (ref 135–145)

## 2015-12-14 LAB — CBC WITH DIFFERENTIAL/PLATELET
BASOS ABS: 0.1 10*3/uL (ref 0.0–0.1)
Basophils Relative: 1 %
EOS ABS: 0.3 10*3/uL (ref 0.0–0.7)
EOS PCT: 3 %
HCT: 49.1 % (ref 39.0–52.0)
Hemoglobin: 17.2 g/dL — ABNORMAL HIGH (ref 13.0–17.0)
LYMPHS PCT: 28 %
Lymphs Abs: 2.8 10*3/uL (ref 0.7–4.0)
MCH: 31.6 pg (ref 26.0–34.0)
MCHC: 35 g/dL (ref 30.0–36.0)
MCV: 90.1 fL (ref 78.0–100.0)
Monocytes Absolute: 0.8 10*3/uL (ref 0.1–1.0)
Monocytes Relative: 8 %
Neutro Abs: 6 10*3/uL (ref 1.7–7.7)
Neutrophils Relative %: 60 %
PLATELETS: 183 10*3/uL (ref 150–400)
RBC: 5.45 MIL/uL (ref 4.22–5.81)
RDW: 13.4 % (ref 11.5–15.5)
WBC: 10 10*3/uL (ref 4.0–10.5)

## 2015-12-14 NOTE — Pre-Procedure Instructions (Signed)
Patient given information to sign up for my chart at home. 

## 2015-12-14 NOTE — OR Nursing (Signed)
Dr.  Jayme Cloud aware of glucose of 308, orders given.

## 2015-12-15 ENCOUNTER — Ambulatory Visit (HOSPITAL_COMMUNITY)
Admission: RE | Admit: 2015-12-15 | Discharge: 2015-12-15 | Disposition: A | Discharge status: Home / Self Care | Payer: Medicaid Other | Source: Ambulatory Visit | Attending: Urology | Admitting: Urology

## 2015-12-15 ENCOUNTER — Ambulatory Visit (HOSPITAL_COMMUNITY): Payer: Medicaid Other | Admitting: Anesthesiology

## 2015-12-15 ENCOUNTER — Encounter (HOSPITAL_COMMUNITY)
Admission: RE | Disposition: A | Discharge status: Home / Self Care | Payer: Self-pay | Source: Ambulatory Visit | Attending: Urology

## 2015-12-15 ENCOUNTER — Encounter (HOSPITAL_COMMUNITY): Payer: Self-pay | Admitting: Anesthesiology

## 2015-12-15 DIAGNOSIS — I739 Peripheral vascular disease, unspecified: Secondary | ICD-10-CM | POA: Diagnosis not present

## 2015-12-15 DIAGNOSIS — E782 Mixed hyperlipidemia: Secondary | ICD-10-CM | POA: Diagnosis not present

## 2015-12-15 DIAGNOSIS — I252 Old myocardial infarction: Secondary | ICD-10-CM | POA: Diagnosis not present

## 2015-12-15 DIAGNOSIS — I2510 Atherosclerotic heart disease of native coronary artery without angina pectoris: Secondary | ICD-10-CM | POA: Diagnosis not present

## 2015-12-15 DIAGNOSIS — E119 Type 2 diabetes mellitus without complications: Secondary | ICD-10-CM | POA: Diagnosis not present

## 2015-12-15 DIAGNOSIS — N471 Phimosis: Secondary | ICD-10-CM | POA: Diagnosis not present

## 2015-12-15 DIAGNOSIS — I10 Essential (primary) hypertension: Secondary | ICD-10-CM | POA: Diagnosis not present

## 2015-12-15 DIAGNOSIS — Z79899 Other long term (current) drug therapy: Secondary | ICD-10-CM | POA: Insufficient documentation

## 2015-12-15 DIAGNOSIS — N476 Balanoposthitis: Secondary | ICD-10-CM | POA: Insufficient documentation

## 2015-12-15 HISTORY — PX: CIRCUMCISION: SHX1350

## 2015-12-15 LAB — GLUCOSE, CAPILLARY
GLUCOSE-CAPILLARY: 286 mg/dL — AB (ref 65–99)
Glucose-Capillary: 211 mg/dL — ABNORMAL HIGH (ref 65–99)

## 2015-12-15 SURGERY — CIRCUMCISION, ADULT
Anesthesia: General

## 2015-12-15 MED ORDER — LACTATED RINGERS IV SOLN
INTRAVENOUS | Status: DC
  Administered 2015-12-15: 12:00:00 via INTRAVENOUS

## 2015-12-15 MED ORDER — GLYCOPYRROLATE 0.2 MG/ML IJ SOLN
INTRAMUSCULAR | Status: DC | PRN
  Administered 2015-12-15: 0.4 mg via INTRAVENOUS

## 2015-12-15 MED ORDER — OXYCODONE HCL 15 MG PO TABS: 15.0000 mg | ORAL_TABLET | ORAL | Status: DC | PRN

## 2015-12-15 MED ORDER — ONDANSETRON HCL 4 MG/2ML IJ SOLN: 4.0000 mg | Freq: Once | INTRAMUSCULAR | Status: DC | PRN

## 2015-12-15 MED ORDER — BACITRACIN 500 UNIT/GM EX OINT
TOPICAL_OINTMENT | Freq: Once | CUTANEOUS | Status: AC
  Administered 2015-12-15: 1 via TOPICAL
  Filled 2015-12-15 (×2): qty 1

## 2015-12-15 MED ORDER — MIDAZOLAM HCL 2 MG/2ML IJ SOLN
1.0000 mg | INTRAMUSCULAR | Status: DC | PRN
Start: 2015-12-15 — End: 2015-12-15
  Administered 2015-12-15: 2 mg via INTRAVENOUS
  Filled 2015-12-15: qty 2

## 2015-12-15 MED ORDER — PROPOFOL 10 MG/ML IV BOLUS
INTRAVENOUS | Status: AC
  Filled 2015-12-15: qty 20

## 2015-12-15 MED ORDER — LIDOCAINE HCL (PF) 1 % IJ SOLN
INTRAMUSCULAR | Status: AC
  Filled 2015-12-15: qty 5

## 2015-12-15 MED ORDER — FENTANYL CITRATE (PF) 100 MCG/2ML IJ SOLN: 25.0000 ug | INTRAMUSCULAR | Status: DC | PRN

## 2015-12-15 MED ORDER — CEFAZOLIN SODIUM 1-5 GM-% IV SOLN
1.0000 g | INTRAVENOUS | Status: AC
  Administered 2015-12-15: 1 g via INTRAVENOUS
  Filled 2015-12-15: qty 50

## 2015-12-15 MED ORDER — BUPIVACAINE HCL (PF) 0.25 % IJ SOLN
INTRAMUSCULAR | Status: DC | PRN
  Administered 2015-12-15: 10 mL

## 2015-12-15 MED ORDER — BUPIVACAINE HCL (PF) 0.25 % IJ SOLN
INTRAMUSCULAR | Status: AC
  Filled 2015-12-15: qty 30

## 2015-12-15 MED ORDER — ONDANSETRON HCL 4 MG/2ML IJ SOLN
4.0000 mg | Freq: Once | INTRAMUSCULAR | Status: AC
  Administered 2015-12-15: 4 mg via INTRAVENOUS
  Filled 2015-12-15: qty 2

## 2015-12-15 MED ORDER — LIDOCAINE HCL 1 % IJ SOLN
INTRAMUSCULAR | Status: DC | PRN
  Administered 2015-12-15: 30 mg via INTRADERMAL

## 2015-12-15 MED ORDER — SUCCINYLCHOLINE CHLORIDE 20 MG/ML IJ SOLN
INTRAMUSCULAR | Status: AC
  Filled 2015-12-15: qty 1

## 2015-12-15 MED ORDER — ETOMIDATE 2 MG/ML IV SOLN
INTRAVENOUS | Status: DC | PRN
  Administered 2015-12-15: 14 mg via INTRAVENOUS

## 2015-12-15 MED ORDER — GLYCOPYRROLATE 0.2 MG/ML IJ SOLN
INTRAMUSCULAR | Status: AC
  Filled 2015-12-15: qty 2

## 2015-12-15 MED ORDER — SUCCINYLCHOLINE CHLORIDE 20 MG/ML IJ SOLN
INTRAMUSCULAR | Status: DC | PRN
  Administered 2015-12-15: 160 mg via INTRAVENOUS

## 2015-12-15 MED ORDER — ROCURONIUM BROMIDE 50 MG/5ML IV SOLN
INTRAVENOUS | Status: AC
  Filled 2015-12-15: qty 1

## 2015-12-15 MED ORDER — MIDAZOLAM HCL 5 MG/5ML IJ SOLN
INTRAMUSCULAR | Status: DC | PRN
  Administered 2015-12-15: 2 mg via INTRAVENOUS

## 2015-12-15 MED ORDER — SULFAMETHOXAZOLE-TRIMETHOPRIM 800-160 MG PO TABS: 1.0000 | ORAL_TABLET | Freq: Two times a day (BID) | ORAL | Status: DC

## 2015-12-15 MED ORDER — 0.9 % SODIUM CHLORIDE (POUR BTL) OPTIME
TOPICAL | Status: DC | PRN
  Administered 2015-12-15: 1000 mL

## 2015-12-15 MED ORDER — LIDOCAINE HCL (PF) 1 % IJ SOLN
INTRAMUSCULAR | Status: AC
Start: 2015-12-15 — End: 2015-12-15
  Filled 2015-12-15: qty 30

## 2015-12-15 MED ORDER — FENTANYL CITRATE (PF) 250 MCG/5ML IJ SOLN
INTRAMUSCULAR | Status: AC
  Filled 2015-12-15: qty 5

## 2015-12-15 MED ORDER — FENTANYL CITRATE (PF) 100 MCG/2ML IJ SOLN
INTRAMUSCULAR | Status: DC | PRN
  Administered 2015-12-15 (×4): 50 ug via INTRAVENOUS

## 2015-12-15 MED ORDER — NEOSTIGMINE METHYLSULFATE 10 MG/10ML IV SOLN
INTRAVENOUS | Status: DC | PRN
  Administered 2015-12-15: 3 mg via INTRAVENOUS

## 2015-12-15 MED ORDER — ROCURONIUM BROMIDE 100 MG/10ML IV SOLN
INTRAVENOUS | Status: DC | PRN
  Administered 2015-12-15: 10 mg via INTRAVENOUS
  Administered 2015-12-15: 20 mg via INTRAVENOUS

## 2015-12-15 MED ORDER — MIDAZOLAM HCL 2 MG/2ML IJ SOLN
INTRAMUSCULAR | Status: AC
  Filled 2015-12-15: qty 2

## 2015-12-15 MED ORDER — DIAZEPAM 5 MG PO TABS
5.0000 mg | ORAL_TABLET | Freq: Every day | ORAL | Status: DC
Start: 2015-12-15 — End: 2016-10-11

## 2015-12-15 SURGICAL SUPPLY — 29 items
BAG HAMPER (MISCELLANEOUS) ×2 IMPLANT
BANDAGE COBAN STERILE 2 (GAUZE/BANDAGES/DRESSINGS) ×2 IMPLANT
BNDG CONFORM 2 STRL LF (GAUZE/BANDAGES/DRESSINGS) ×2 IMPLANT
DECANTER SPIKE VIAL GLASS SM (MISCELLANEOUS) IMPLANT
DRSG XEROFORM 1X8 (GAUZE/BANDAGES/DRESSINGS) ×2 IMPLANT
ELECT NEEDLE TIP 2.8 STRL (NEEDLE) IMPLANT
ELECT REM PT RETURN 9FT ADLT (ELECTROSURGICAL) ×2
ELECTRODE REM PT RTRN 9FT ADLT (ELECTROSURGICAL) ×1 IMPLANT
GAUZE KERLIX 2  STERILE LF (GAUZE/BANDAGES/DRESSINGS) ×2 IMPLANT
GAUZE SPONGE 4X4 12PLY STRL (GAUZE/BANDAGES/DRESSINGS) IMPLANT
GAUZE SPONGE 4X4 16PLY XRAY LF (GAUZE/BANDAGES/DRESSINGS) IMPLANT
GLOVE BIO SURGEON STRL SZ8 (GLOVE) ×2 IMPLANT
GLOVE BIOGEL PI IND STRL 7.0 (GLOVE) ×1 IMPLANT
GLOVE BIOGEL PI INDICATOR 7.0 (GLOVE) ×1
GLOVE ECLIPSE 7.0 STRL STRAW (GLOVE) ×2 IMPLANT
GOWN STRL REUS W/ TWL XL LVL3 (GOWN DISPOSABLE) ×1 IMPLANT
GOWN STRL REUS W/TWL XL LVL3 (GOWN DISPOSABLE) ×1
KIT ROOM TURNOVER APOR (KITS) ×4 IMPLANT
NEEDLE HYPO 25X1 1.5 SAFETY (NEEDLE) IMPLANT
NS IRRIG 1000ML POUR BTL (IV SOLUTION) ×2 IMPLANT
NS IRRIG 500ML POUR BTL (IV SOLUTION) IMPLANT
PACK MINOR (CUSTOM PROCEDURE TRAY) ×2 IMPLANT
PAD ARMBOARD 7.5X6 YLW CONV (MISCELLANEOUS) ×2 IMPLANT
SET BASIN LINEN APH (SET/KITS/TRAYS/PACK) ×2 IMPLANT
SUT VIC AB 4-0 PS2 27 (SUTURE) ×4 IMPLANT
SUT VICRYL 4-0 PS2 18IN ABS (SUTURE) ×4 IMPLANT
SYR CONTROL 10ML LL (SYRINGE) IMPLANT
TOWEL OR 17X26 10 PK STRL BLUE (TOWEL DISPOSABLE) ×2 IMPLANT
WATER STERILE IRR 500ML POUR (IV SOLUTION) IMPLANT

## 2015-12-15 NOTE — Anesthesia Procedure Notes (Signed)
Procedure Name: Intubation Date/Time: 12/15/2015 12:52 PM Performed by: Despina Hidden Pre-anesthesia Checklist: Patient identified, Emergency Drugs available, Suction available and Patient being monitored Patient Re-evaluated:Patient Re-evaluated prior to inductionOxygen Delivery Method: Circle system utilized Preoxygenation: Pre-oxygenation with 100% oxygen Intubation Type: IV induction, Rapid sequence and Cricoid Pressure applied Ventilation: Mask ventilation without difficulty Laryngoscope Size: Mac and 3 Grade View: Grade II Tube type: Oral Tube size: 8.0 mm Number of attempts: 1 Airway Equipment and Method: Stylet and Oral airway Placement Confirmation: ETT inserted through vocal cords under direct vision,  positive ETCO2 and breath sounds checked- equal and bilateral Secured at: 22 cm Tube secured with: Tape Dental Injury: Teeth and Oropharynx as per pre-operative assessment

## 2015-12-15 NOTE — Anesthesia Postprocedure Evaluation (Signed)
Anesthesia Post Note  Patient: Ryan [name]  Procedure(s) Performed: Procedure(s) (LRB): CIRCUMCISION ADULT (N/A)  Patient location during evaluation: PACU Anesthesia Type: General Level of consciousness: awake and alert and patient cooperative Pain management: pain level controlled Vital Signs Assessment: post-procedure vital signs reviewed and stable Respiratory status: spontaneous breathing and patient connected to face mask oxygen Cardiovascular status: blood pressure returned to baseline Postop Assessment: no signs of nausea or vomiting Anesthetic complications: no    Last Vitals:  Filed Vitals:   12/15/15 1215 12/15/15 1338  BP: 108/77 168/96  Pulse:  99  Temp:  36.6 C  Resp: 21 20    Last Pain: There were no vitals filed for this visit.               Waldemar Siegel J

## 2015-12-15 NOTE — H&P (Signed)
Urology Admission H&P  Chief Complaint: penile pain  History of Present Illness: Mr Ryan [name] is a 50yo with a hx of phimosis that has been treated twice in the past 3 months with balanoposthitis. He just finished a course of antibiotics and antifungal cream. He has urgency and frequency but no other LUTS. He does get spontaneous erections  Past Medical History  Diagnosis Date  . Anal fissure   . Rectal polyp   . Mixed hyperlipidemia     takes Simvastatin daily  . PAD (peripheral artery disease) (HCC)   . GERD (gastroesophageal reflux disease)     "takes tums"  . Hypertension     takes Enalapril and Coreg daily   . Coronary atherosclerosis of native coronary artery     takes Effient but on hold for surgery  . MI (myocardial infarction) (HCC)     AMI 1/13 - complicated by VT/Tosades  . Bruises easily     d/t being on Effient  . History of kidney stones   . Diabetes mellitus without complication (HCC)     takes Lantus daily  . History of blood transfusion     no abnormal reaction noted   Past Surgical History  Procedure Laterality Date  . Appendectomy    . Diagnostic laparoscopy    . Anal fissurectomy    . Coronary angioplasty      stent placed Dec 12, 2011  . Aorta - bilateral femoral artery bypass graft N/A 01/16/2013    Procedure: AORTA BIFEMORAL BYPASS GRAFT;  Surgeon: Nada Libman, MD;  Location: [facility] OR;  Service: Vascular;  Laterality: N/A;  . Colonoscopy    . Carotid-subclavian bypass graft Left 04/03/2013    Procedure: BYPASS GRAFT CAROTID-SUBCLAVIAN;  Surgeon: Nada Libman, MD;  Location: [facility] OR;  Service: Vascular;  Laterality: Left;  . I&d extremity Left 04/18/2013    Procedure: debridement of left neck lymphocele;  Surgeon: Nada Libman, MD;  Location: [facility] OR;  Service: Vascular;  Laterality: Left;  I and D of left neck  . Left heart catheterization with coronary angiogram N/A 12/12/2011    Procedure: LEFT HEART CATHETERIZATION WITH CORONARY ANGIOGRAM;  Surgeon:  Kathleene Hazel, MD;  Location: [facility] CATH LAB;  Service: Cardiovascular;  Laterality: N/A;  . Percutaneous coronary stent intervention (pci-s) Right 12/12/2011    Procedure: PERCUTANEOUS CORONARY STENT INTERVENTION (PCI-S);  Surgeon: Kathleene Hazel, MD;  Location: [facility] CATH LAB;  Service: Cardiovascular;  Laterality: Right;  . Lower extremity angiogram N/A 01/31/2012    Procedure: LOWER EXTREMITY ANGIOGRAM;  Surgeon: Kathleene Hazel, MD;  Location: [facility] CATH LAB;  Service: Cardiovascular;  Laterality: N/A;  . I&d extremity Right 11/21/2014    Procedure: IRRIGATION AND DEBRIDEMENT RIGHT HAND;  Surgeon: Dominica Severin, MD;  Location: [facility] OR;  Service: Orthopedics;  Laterality: Right;  . Repair extensor tendon Right 11/21/2014    Procedure: WITH REPAIR/RECONSTRUCTION OF EXTENSOR TENDONS AS NEEDED;  Surgeon: Dominica Severin, MD;  Location: [facility] OR;  Service: Orthopedics;  Laterality: Right;    Home Medications:  Prescriptions prior to admission  Medication Sig Dispense Refill Last Dose  . carvedilol (COREG) 25 MG tablet Take 25 mg by mouth 2 (two) times daily with a meal.   12/15/2015 at 0700  . enalapril (VASOTEC) 5 MG tablet TAKE ONE TABLET BY MOUTH TWICE DAILY 60 tablet 2 12/15/2015 at 0700  . Insulin Glargine (LANTUS SOLOSTAR) 100 UNIT/ML Solostar Pen Inject 45 Units into the skin at bedtime.    12/14/2015  at Unknown time  . isosorbide mononitrate (IMDUR) 30 MG 24 hr tablet Take 1 tablet (30 mg total) by mouth daily. 90 tablet 3 12/15/2015 at Unknown time  . oxyCODONE (ROXICODONE) 5 MG immediate release tablet Take 1 tablet (5 mg total) by mouth every 4 (four) hours as needed for severe pain. 40 tablet 0 Past Week at Unknown time  . simvastatin (ZOCOR) 40 MG tablet Take 40 mg by mouth at bedtime.   Past Week at Unknown time  . spironolactone (ALDACTONE) 25 MG tablet Take 0.5 tablets (12.5 mg total) by mouth daily. 15 tablet 11 Past Week at Unknown time  . varenicline (CHANTIX CONTINUING  MONTH PAK) 1 MG tablet Take as directed 60 tablet 1 Past Month at Unknown time  . aspirin EC 81 MG tablet Take 162 mg by mouth daily.   Unknown at Unknown time  . carvedilol (COREG) 3.125 MG tablet TAKE ONE TABLET BY MOUTH TWICE A DAY WITH MEALS (Patient not taking: Reported on 12/13/2015) 60 tablet 2 Not Taking at Unknown time  . clopidogrel (PLAVIX) 75 MG tablet Take 1 tablet (75 mg total) by mouth daily. 90 tablet 3 Unknown at Unknown time  . nitroGLYCERIN (NITROSTAT) 0.4 MG SL tablet Place 1 tablet (0.4 mg total) under the tongue every 5 (five) minutes as needed for chest pain. 25 tablet 6 Unknown at Unknown time  . varenicline (CHANTIX STARTING MONTH PAK) 0.5 MG X 11 & 1 MG X 42 tablet Take as directed (Patient not taking: Reported on 12/13/2015) 53 tablet 0 Not Taking at Unknown time   Allergies:  Allergies  Allergen Reactions  . Hydrocodone Nausea And Vomiting    Family History  Problem Relation Age of Onset  . Cancer Father     Lung  . Cancer Maternal Uncle     Colon  . Diabetes Mother   . COPD Mother    Social History:  reports that he has been smoking Cigarettes.  He has a 6.25 pack-year smoking history. He has never used smokeless tobacco. He reports that he does not drink alcohol or use illicit drugs.  Review of Systems  Genitourinary: Positive for urgency and frequency.  All other systems reviewed and are negative.   Physical Exam:  Vital signs in last 24 hours: Temp:  [98 F (36.7 C)] 98 F (36.7 C) (01/11 1109) Pulse Rate:  [78] 78 (01/11 1109) Resp:  [13-31] 21 (01/11 1215) BP: (106-118)/(70-77) 108/77 mmHg (01/11 1215) SpO2:  [100 %] 100 % (01/11 1109) Weight:  [96.616 kg (213 lb)] 96.616 kg (213 lb) (01/11 1109) Physical Exam  Constitutional: He is oriented to person, place, and time. He appears well-developed and well-nourished.  HENT:  Head: Normocephalic and atraumatic.  Eyes: EOM are normal. Pupils are equal, round, and reactive to light.  Neck: Normal  range of motion. No thyromegaly present.  Cardiovascular: Normal rate and regular rhythm.   Respiratory: Effort normal. No respiratory distress.  GI: Soft. He exhibits no distension. There is no tenderness. Hernia confirmed negative in the right inguinal area and confirmed negative in the left inguinal area.  Genitourinary: Testes normal. Phimosis present.  Musculoskeletal: Normal range of motion.  Neurological: He is alert and oriented to person, place, and time.  Skin: Skin is warm and dry.  Psychiatric: He has a normal mood and affect. His behavior is normal. Judgment and thought content normal.    Laboratory Data:  Results for orders placed or performed during the hospital encounter of 12/15/15 (from  the past 24 hour(s))  Glucose, capillary     Status: Abnormal   Collection Time: 12/15/15 11:04 AM  Result Value Ref Range   Glucose-Capillary 286 (H) 65 - 99 mg/dL   No results found for this or any previous visit (from the past 240 hour(s)). Creatinine:  Recent Labs  12/14/15 0915  CREATININE 0.94   Baseline Creatinine: unknown  Impression/Assessment:  49yo with phimosis and balanitis  Plan:  The risks/benefits/alternatives to circumcision was explained to the patient and he understands and wishes to proceed with surgery  Jena Tegeler L 12/15/2015, 12:34 PM

## 2015-12-15 NOTE — Anesthesia Preprocedure Evaluation (Signed)
Anesthesia Evaluation  Patient identified by MRN, date of birth, ID band Patient awake    Reviewed: Allergy & Precautions, H&P , Patient's Chart, lab work & pertinent test results, reviewed documented beta blocker date and time   Airway Mallampati: II  TM Distance: >3 FB Neck ROM: Full    Dental  (+) Edentulous Upper, Dental Advisory Given   Pulmonary COPD, Current Smoker,    Pulmonary exam normal        Cardiovascular hypertension, + CAD, + Past MI and + Peripheral Vascular Disease  Normal cardiovascular exam Rhythm:Regular Rate:Normal  11/19/2014  Nuclear Med Background Indication for Stress Test: Evaluation for Ischemia, Stent Patency, and Surgical Clearance for (R)Hand surgery- Dominica Severin, MD on 11-24-2014 History: CAD, Stent,and 08-15-2012 Myocardial Perfusion Imaging-Small Apical Ischemia,EF=50%, Low risk   Neuro/Psych negative neurological ROS  negative psych ROS   GI/Hepatic Neg liver ROS, GERD  Poorly Controlled,  Endo/Other  diabetes, Poorly Controlled, Type 2  Renal/GU negative Renal ROS     Musculoskeletal   Abdominal   Peds  Hematology   Anesthesia Other Findings   Reproductive/Obstetrics                             Anesthesia Physical Anesthesia Plan  ASA: III  Anesthesia Plan: General   Post-op Pain Management:    Induction: Intravenous, Rapid sequence and Cricoid pressure planned  Airway Management Planned: Oral ETT  Additional Equipment:   Intra-op Plan:   Post-operative Plan: Extubation in OR  Informed Consent: I have reviewed the patients History and Physical, chart, labs and discussed the procedure including the risks, benefits and alternatives for the proposed anesthesia with the patient or authorized representative who has indicated his/her understanding and acceptance.     Plan Discussed with:   Anesthesia Plan Comments:          Anesthesia Quick Evaluation

## 2015-12-15 NOTE — Brief Op Note (Signed)
12/15/2015  1:32 PM  PATIENT:  Ryan [name]  50 y.o. male  PRE-OPERATIVE DIAGNOSIS:  phimosis, recurrent balanoposthitis  POST-OPERATIVE DIAGNOSIS:  phimosis, recurrent balanoposthitis  PROCEDURE:  Procedure(s): CIRCUMCISION ADULT (N/A)  SURGEON:  Surgeon(s) and Role:    * Malen Gauze, MD - Primary  PHYSICIAN ASSISTANT:   ASSISTANTS: none   ANESTHESIA:   local and general  EBL:  Total I/O In: 200 [I.V.:200] Out: -   BLOOD ADMINISTERED:none  DRAINS: none   LOCAL MEDICATIONS USED:  MARCAINE     SPECIMEN:  Source of Specimen:  foreskin  DISPOSITION OF SPECIMEN:  PATHOLOGY  COUNTS:  YES  TOURNIQUET:  * No tourniquets in log *  DICTATION: .Note written in EPIC  PLAN OF CARE: Discharge to home after PACU  PATIENT DISPOSITION:  PACU - hemodynamically stable.   Delay start of Pharmacological VTE agent (>24hrs) due to surgical blood loss or risk of bleeding: yes

## 2015-12-15 NOTE — Op Note (Signed)
Preoperative diagnosis: phimosis, recurrent balanoposthitis  Postoperative diagnosis: Same  Procedure: 1. Circumcision 2. Penile block  Attending: Wilkie Aye, MD  Anesthesia: General. local  History of blood loss: Minimal  Antibiotics: ancef  Drains: none  Specimens: foreskin  Findings: dense phimotic ring. No masses/lesions on glans or foreskin.  Indications: Patient is a 50 year old male with a history of phimosis, difficulty urinating, and recurrent balanoprothitis  After discussing treatment options he decided to proceed with circumcision   Procedure in detail: Prior to procedure consent was obtained.  Patient was brought to the operating room and a brief timeout was done to ensure correct patient, correct procedure, correct site.  General anesthesia was administered and patient was placed in supine position.  His genitalia and abdomen was then prepped and draped in usual sterile fashion.  The phimotic ring was incised sharply and the foreskin was then retracted. An circumferential incision was made 1.5cm below the coronal ridge. We then pulled the foreskin over the glans and made a separate circumferential incision at the level of the coronal ridge. We then sharply incised the foreskin and then freed it from the dartos using electrocautery. We then sent the foreskin for pathology. Individual bleeders were then cauterized and good hemostasis was noted. We then reapproximated the penile skin to the subcoronal incision. We used interrupted 5-0 vicryl to close the incision. We then injected 10cc of 0.25% marciane between the pubic bone and the corpora for the penile block. Once this was complete we applied a gauze bandage and this then concluded the procedure which was well tolerated by the patient.  Complications: None  Condition: Stable, extubated, transferred to PACU.  Plan: Patient is to be discharged home.  He is to followup in 2 weeks for a wound check

## 2015-12-15 NOTE — Discharge Instructions (Signed)
Circumcision, Adult, Care After    Refer to this sheet in the next few weeks. These instructions provide you with information on caring for yourself after your procedure. Your health care provider may also give you more specific instructions. Your treatment has been planned according to current medical practices, but problems sometimes occur. Call your health care provider if you have any problems or questions after your procedure.  WHAT TO EXPECT AFTER THE PROCEDURE  After your procedure, it is typical to have the following sensations:  · Redness at the incision site.  · Soreness at the incision site.  · Slight swelling around the incision.  HOME CARE INSTRUCTIONS  · Take all medicines as directed by your health care provider.  · Any dressings should stay on for at least 24 hours. You may take the dressing off at night to let air circulate around the incision. Once a scab has formed over the incision, you no longer need to cover your incision with a dressing.  · Carefully remove the bandage if it gets dirty. Apply medicated cream to the incision. Carefully put a new bandage on if a scab has not formed.  · Do not have sex until your health care provider says it is okay.  · Do not get your incision site wet for 24 hours or as told by your health care provider.  · You may take a sponge bath. Clean around the incision site gently with mild soap and water.  · You may take a shower after 24 hours or as told by your health care provider. Do not take a tub bath. After you shower, gently pat dry the incision site. Do not rub it.  · Avoid heavy lifting.  · Avoid contact sports, biking, or swimming until you have healed. This will usually be between 10 days and 14 days after the procedure.  SEEK MEDICAL CARE IF:  · You are experiencing pain that is not relieved by your medicine.  · You have swelling or redness that is unexpected.  · You develop a fever.  SEEK IMMEDIATE MEDICAL CARE IF:  · You cannot urinate.  · You have pain  when you urinate.  · Your pain is not helped by medicines.  · There is redness, swelling, and pain (inflammation) spreading up the shaft of your penis, thighs, or lower abdomen.  · There is pus coming from your incision.  · You have bleeding that does not stop when you press on it.     This information is not intended to replace advice given to you by your health care provider. Make sure you discuss any questions you have with your health care provider.     Document Released: 09/10/2013 Document Revised: 12/11/2014 Document Reviewed: 09/10/2013  Elsevier Interactive Patient Education ©2016 [employer]   

## 2015-12-15 NOTE — Transfer of Care (Signed)
Immediate Anesthesia Transfer of Care Note  Patient: Ryan [name]  Procedure(s) Performed: Procedure(s): CIRCUMCISION ADULT (N/A)  Patient Location: PACU  Anesthesia Type:General  Level of Consciousness: awake and patient cooperative  Airway & Oxygen Therapy: Patient Spontanous Breathing and Patient connected to face mask oxygen  Post-op Assessment: Report given to RN, Post -op Vital signs reviewed and stable and Patient moving all extremities  Post vital signs: Reviewed and stable  Last Vitals:  Filed Vitals:   12/15/15 1210 12/15/15 1215  BP: 118/76 108/77  Pulse:    Temp:    Resp: 29 21    Complications: No apparent anesthesia complications

## 2015-12-16 ENCOUNTER — Encounter (HOSPITAL_COMMUNITY): Payer: Medicaid Other

## 2015-12-16 ENCOUNTER — Encounter (HOSPITAL_COMMUNITY): Payer: Self-pay | Admitting: Urology

## 2015-12-16 ENCOUNTER — Ambulatory Visit: Payer: Medicaid Other | Admitting: Family

## 2015-12-28 ENCOUNTER — Ambulatory Visit (INDEPENDENT_AMBULATORY_CARE_PROVIDER_SITE_OTHER): Payer: Self-pay | Admitting: Urology

## 2015-12-28 DIAGNOSIS — N471 Phimosis: Secondary | ICD-10-CM

## 2016-01-05 ENCOUNTER — Ambulatory Visit: Payer: Medicaid Other | Admitting: Urology

## 2016-01-21 ENCOUNTER — Encounter (HOSPITAL_COMMUNITY): Payer: Medicaid Other

## 2016-01-21 ENCOUNTER — Ambulatory Visit: Payer: Medicaid Other | Admitting: Family

## 2016-02-07 ENCOUNTER — Encounter: Payer: Self-pay | Admitting: Family

## 2016-02-10 ENCOUNTER — Other Ambulatory Visit: Payer: Self-pay

## 2016-02-10 DIAGNOSIS — Z48812 Encounter for surgical aftercare following surgery on the circulatory system: Secondary | ICD-10-CM

## 2016-02-10 DIAGNOSIS — I771 Stricture of artery: Secondary | ICD-10-CM

## 2016-02-10 DIAGNOSIS — I70211 Atherosclerosis of native arteries of extremities with intermittent claudication, right leg: Secondary | ICD-10-CM

## 2016-02-10 DIAGNOSIS — Z95828 Presence of other vascular implants and grafts: Secondary | ICD-10-CM

## 2016-02-11 ENCOUNTER — Encounter (HOSPITAL_COMMUNITY): Payer: Medicaid Other

## 2016-02-11 ENCOUNTER — Ambulatory Visit: Payer: Medicaid Other | Admitting: Family

## 2016-06-19 ENCOUNTER — Ambulatory Visit (HOSPITAL_COMMUNITY)
Admission: RE | Admit: 2016-06-19 | Discharge: 2016-06-19 | Disposition: A | Discharge status: Home / Self Care | Payer: Medicaid Other | Source: Ambulatory Visit | Attending: Surgery | Admitting: Surgery

## 2016-06-19 ENCOUNTER — Other Ambulatory Visit: Payer: Self-pay | Admitting: Surgery

## 2016-06-19 ENCOUNTER — Ambulatory Visit (INDEPENDENT_AMBULATORY_CARE_PROVIDER_SITE_OTHER)
Admission: RE | Admit: 2016-06-19 | Discharge: 2016-06-19 | Disposition: A | Discharge status: Home / Self Care | Payer: Medicaid Other | Source: Ambulatory Visit | Attending: Surgery | Admitting: Surgery

## 2016-06-19 DIAGNOSIS — I771 Stricture of artery: Secondary | ICD-10-CM | POA: Insufficient documentation

## 2016-06-19 DIAGNOSIS — I70211 Atherosclerosis of native arteries of extremities with intermittent claudication, right leg: Secondary | ICD-10-CM | POA: Diagnosis not present

## 2016-06-19 DIAGNOSIS — I2510 Atherosclerotic heart disease of native coronary artery without angina pectoris: Secondary | ICD-10-CM | POA: Diagnosis not present

## 2016-06-19 DIAGNOSIS — I739 Peripheral vascular disease, unspecified: Secondary | ICD-10-CM | POA: Insufficient documentation

## 2016-06-19 DIAGNOSIS — E782 Mixed hyperlipidemia: Secondary | ICD-10-CM | POA: Insufficient documentation

## 2016-06-19 DIAGNOSIS — I10 Essential (primary) hypertension: Secondary | ICD-10-CM | POA: Insufficient documentation

## 2016-06-19 DIAGNOSIS — Z48812 Encounter for surgical aftercare following surgery on the circulatory system: Secondary | ICD-10-CM | POA: Insufficient documentation

## 2016-06-19 DIAGNOSIS — E119 Type 2 diabetes mellitus without complications: Secondary | ICD-10-CM | POA: Diagnosis not present

## 2016-06-19 DIAGNOSIS — K219 Gastro-esophageal reflux disease without esophagitis: Secondary | ICD-10-CM | POA: Diagnosis not present

## 2016-06-19 DIAGNOSIS — Z95828 Presence of other vascular implants and grafts: Secondary | ICD-10-CM | POA: Diagnosis not present

## 2016-06-22 ENCOUNTER — Encounter: Payer: Self-pay | Admitting: Family

## 2016-06-26 ENCOUNTER — Ambulatory Visit: Payer: Medicaid Other | Admitting: Family

## 2016-06-30 ENCOUNTER — Encounter: Payer: Self-pay | Admitting: Family

## 2016-07-04 ENCOUNTER — Ambulatory Visit: Payer: Medicaid Other | Admitting: Family

## 2016-07-06 ENCOUNTER — Encounter: Payer: Self-pay | Admitting: Family

## 2016-07-07 ENCOUNTER — Encounter: Payer: Self-pay | Admitting: Family

## 2016-07-07 ENCOUNTER — Ambulatory Visit (INDEPENDENT_AMBULATORY_CARE_PROVIDER_SITE_OTHER): Payer: Medicaid Other | Admitting: Family

## 2016-07-07 DIAGNOSIS — Z720 Tobacco use: Secondary | ICD-10-CM | POA: Diagnosis not present

## 2016-07-07 DIAGNOSIS — IMO0002 Reserved for concepts with insufficient information to code with codable children: Secondary | ICD-10-CM

## 2016-07-07 DIAGNOSIS — I708 Atherosclerosis of other arteries: Secondary | ICD-10-CM | POA: Diagnosis not present

## 2016-07-07 DIAGNOSIS — E1151 Type 2 diabetes mellitus with diabetic peripheral angiopathy without gangrene: Secondary | ICD-10-CM | POA: Diagnosis not present

## 2016-07-07 DIAGNOSIS — E1165 Type 2 diabetes mellitus with hyperglycemia: Secondary | ICD-10-CM

## 2016-07-07 DIAGNOSIS — I7409 Other arterial embolism and thrombosis of abdominal aorta: Secondary | ICD-10-CM | POA: Diagnosis not present

## 2016-07-07 DIAGNOSIS — I771 Stricture of artery: Secondary | ICD-10-CM

## 2016-07-07 DIAGNOSIS — F17200 Nicotine dependence, unspecified, uncomplicated: Secondary | ICD-10-CM

## 2016-07-07 NOTE — Progress Notes (Signed)
[facility] [place] HISTORY AND PHYSICAL   MRN : [id number]  History of Present Illness:   Ryan [name] is a 50 y.o. male patient of whom Dr. Myra Gianotti initially evaluated in March of 2013. Pt has missed multiple appointments and was last evaluated by Dr. Myra Gianotti on 08/11/13.  At that time by physical exam the patient's left carotid subclavian bypass graft was widely patent. In addition he had faintly palpable pedal pulses. Dr. Myra Gianotti discussed with the patient that he did not feel that his complaints that day in his right arm as well as his left leg were related to blood flow issues. They appeared to be musculoskeletal. Dr. Myra Gianotti planned on seeing him back in 6 months for followup carotid ultrasound to evaluate his carotid subclavian bypass graft, as well as ABIs. Dr. Myra Gianotti encouraged him to meet with his primary care physician to discuss his complaints in the right arm and left leg.  He suffered a MI in January 2013, and at that time he had a drug-eluting stent placed to his LAD. During this process, an abdominal aortogram revealed aortic occlusion at the level of the renal arteries. He also has a left subclavian artery occlusion. Because of his drug-eluting stent, Dr. Myra Gianotti could not  proceed with surgical revascularization. He underwent aortobifemoral bypass grafting on 01/16/2013. He recovered nicely from this and on 04/03/2013 he underwent left carotid subclavian bypass graft. He had to go back to the operating room on 04/18/2013 for drainage of a lymphocele. He is now recovered from his operations.  His right calf hurts after walking about 40 feet, resolves after several minutes of rest. No problems with the left leg. He denies non healing wounds.   Pt Diabetic: Yes, states his last A1C was 11.? Pt smoker: smoker  (1/2 ppd, started at age 78)  Pt meds include: Statin :Yes Betablocker: Yes ASA: Yes Other anticoagulants/antiplatelets: Plavix   Current  Outpatient Prescriptions  Medication Sig Dispense Refill  . aspirin EC 81 MG tablet Take 162 mg by mouth daily.    . carvedilol (COREG) 25 MG tablet Take 25 mg by mouth 2 (two) times daily with a meal.    . carvedilol (COREG) 3.125 MG tablet TAKE ONE TABLET BY MOUTH TWICE A DAY WITH MEALS 60 tablet 2  . clopidogrel (PLAVIX) 75 MG tablet Take 1 tablet (75 mg total) by mouth daily. 90 tablet 3  . enalapril (VASOTEC) 5 MG tablet TAKE ONE TABLET BY MOUTH TWICE DAILY 60 tablet 2  . Insulin Glargine (LANTUS SOLOSTAR) 100 UNIT/ML Solostar Pen Inject 45 Units into the skin at bedtime.     . isosorbide mononitrate (IMDUR) 30 MG 24 hr tablet Take 1 tablet (30 mg total) by mouth daily. 90 tablet 3  . nitroGLYCERIN (NITROSTAT) 0.4 MG SL tablet Place 1 tablet (0.4 mg total) under the tongue every 5 (five) minutes as needed for chest pain. 25 tablet 6  . oxyCODONE (ROXICODONE) 15 MG immediate release tablet Take 1 tablet (15 mg total) by mouth every 4 (four) hours as needed for severe pain. 30 tablet 0  . simvastatin (ZOCOR) 40 MG tablet Take 40 mg by mouth at bedtime.    Marland Kitchen spironolactone (ALDACTONE) 25 MG tablet Take 0.5 tablets (12.5 mg total) by mouth daily. 15 tablet 11  . diazepam (VALIUM) 5 MG tablet Take 1 tablet (5 mg total) by mouth at bedtime. (Patient not taking: Reported on 07/07/2016) 14 tablet 0  . sulfamethoxazole-trimethoprim (BACTRIM DS,SEPTRA DS) 800-160 MG  tablet Take 1 tablet by mouth 2 (two) times daily. (Patient not taking: Reported on 07/07/2016) 14 tablet 0  . varenicline (CHANTIX CONTINUING MONTH PAK) 1 MG tablet Take as directed (Patient not taking: Reported on 07/07/2016) 60 tablet 1  . varenicline (CHANTIX STARTING MONTH PAK) 0.5 MG X 11 & 1 MG X 42 tablet Take as directed (Patient not taking: Reported on 07/07/2016) 53 tablet 0   No current facility-administered medications for this visit.     Past Medical History:  Diagnosis Date  . Anal fissure   . Bruises easily    d/t being on  Effient  . Coronary atherosclerosis of native coronary artery    takes Effient but on hold for surgery  . Diabetes mellitus without complication (HCC)    takes Lantus daily  . GERD (gastroesophageal reflux disease)    "takes tums"  . History of blood transfusion    no abnormal reaction noted  . History of kidney stones   . Hypertension    takes Enalapril and Coreg daily   . MI (myocardial infarction) (HCC)    AMI 1/13 - complicated by VT/Tosades  . Mixed hyperlipidemia    takes Simvastatin daily  . PAD (peripheral artery disease) (HCC)   . Rectal polyp     Social History Social History  Substance Use Topics  . Smoking status: Current Every Day Smoker    Packs/day: 0.25    Years: 25.00    Types: Cigarettes  . Smokeless tobacco: Never Used  . Alcohol use No    Family History Family History  Problem Relation Age of Onset  . Cancer Father     Lung  . Cancer Maternal Uncle     Colon  . Diabetes Mother   . COPD Mother     Surgical History Past Surgical History:  Procedure Laterality Date  . ANAL FISSURECTOMY    . AORTA - BILATERAL FEMORAL ARTERY BYPASS GRAFT N/A 01/16/2013   Procedure: AORTA BIFEMORAL BYPASS GRAFT;  Surgeon: Nada Libman, MD;  Location: [facility] OR;  Service: Vascular;  Laterality: N/A;  . APPENDECTOMY    . CAROTID-SUBCLAVIAN BYPASS GRAFT Left 04/03/2013   Procedure: BYPASS GRAFT CAROTID-SUBCLAVIAN;  Surgeon: Nada Libman, MD;  Location: [facility] OR;  Service: Vascular;  Laterality: Left;  . CIRCUMCISION N/A 12/15/2015   Procedure: CIRCUMCISION ADULT;  Surgeon: Malen Gauze, MD;  Location: [facility] ORS;  Service: Urology;  Laterality: N/A;  . COLONOSCOPY    . CORONARY ANGIOPLASTY     stent placed Dec 12, 2011  . DIAGNOSTIC LAPAROSCOPY    . I&D EXTREMITY Left 04/18/2013   Procedure: debridement of left neck lymphocele;  Surgeon: Nada Libman, MD;  Location: [facility] OR;  Service: Vascular;  Laterality: Left;  I and D of left neck  . I&D EXTREMITY Right 11/21/2014    Procedure: IRRIGATION AND DEBRIDEMENT RIGHT HAND;  Surgeon: Dominica Severin, MD;  Location: [facility] OR;  Service: Orthopedics;  Laterality: Right;  . LEFT HEART CATHETERIZATION WITH CORONARY ANGIOGRAM N/A 12/12/2011   Procedure: LEFT HEART CATHETERIZATION WITH CORONARY ANGIOGRAM;  Surgeon: Kathleene Hazel, MD;  Location: [facility] CATH LAB;  Service: Cardiovascular;  Laterality: N/A;  . LOWER EXTREMITY ANGIOGRAM N/A 01/31/2012   Procedure: LOWER EXTREMITY ANGIOGRAM;  Surgeon: Kathleene Hazel, MD;  Location: [facility] CATH LAB;  Service: Cardiovascular;  Laterality: N/A;  . PERCUTANEOUS CORONARY STENT INTERVENTION (PCI-S) Right 12/12/2011   Procedure: PERCUTANEOUS CORONARY STENT INTERVENTION (PCI-S);  Surgeon: Kathleene Hazel, MD;  Location: [facility]  CATH LAB;  Service: Cardiovascular;  Laterality: Right;  . REPAIR EXTENSOR TENDON Right 11/21/2014   Procedure: WITH REPAIR/RECONSTRUCTION OF EXTENSOR TENDONS AS NEEDED;  Surgeon: Dominica Severin, MD;  Location: [facility] OR;  Service: Orthopedics;  Laterality: Right;    Allergies  Allergen Reactions  . Hydrocodone Nausea And Vomiting    Current Outpatient Prescriptions  Medication Sig Dispense Refill  . aspirin EC 81 MG tablet Take 162 mg by mouth daily.    . carvedilol (COREG) 25 MG tablet Take 25 mg by mouth 2 (two) times daily with a meal.    . carvedilol (COREG) 3.125 MG tablet TAKE ONE TABLET BY MOUTH TWICE A DAY WITH MEALS 60 tablet 2  . clopidogrel (PLAVIX) 75 MG tablet Take 1 tablet (75 mg total) by mouth daily. 90 tablet 3  . enalapril (VASOTEC) 5 MG tablet TAKE ONE TABLET BY MOUTH TWICE DAILY 60 tablet 2  . Insulin Glargine (LANTUS SOLOSTAR) 100 UNIT/ML Solostar Pen Inject 45 Units into the skin at bedtime.     . isosorbide mononitrate (IMDUR) 30 MG 24 hr tablet Take 1 tablet (30 mg total) by mouth daily. 90 tablet 3  . nitroGLYCERIN (NITROSTAT) 0.4 MG SL tablet Place 1 tablet (0.4 mg total) under the tongue every 5 (five) minutes as needed for chest  pain. 25 tablet 6  . oxyCODONE (ROXICODONE) 15 MG immediate release tablet Take 1 tablet (15 mg total) by mouth every 4 (four) hours as needed for severe pain. 30 tablet 0  . simvastatin (ZOCOR) 40 MG tablet Take 40 mg by mouth at bedtime.    Marland Kitchen spironolactone (ALDACTONE) 25 MG tablet Take 0.5 tablets (12.5 mg total) by mouth daily. 15 tablet 11  . diazepam (VALIUM) 5 MG tablet Take 1 tablet (5 mg total) by mouth at bedtime. (Patient not taking: Reported on 07/07/2016) 14 tablet 0  . sulfamethoxazole-trimethoprim (BACTRIM DS,SEPTRA DS) 800-160 MG tablet Take 1 tablet by mouth 2 (two) times daily. (Patient not taking: Reported on 07/07/2016) 14 tablet 0  . varenicline (CHANTIX CONTINUING MONTH PAK) 1 MG tablet Take as directed (Patient not taking: Reported on 07/07/2016) 60 tablet 1  . varenicline (CHANTIX STARTING MONTH PAK) 0.5 MG X 11 & 1 MG X 42 tablet Take as directed (Patient not taking: Reported on 07/07/2016) 53 tablet 0   No current facility-administered medications for this visit.      REVIEW OF SYSTEMS: See HPI for pertinent positives and negatives.  Physical Examination Vitals:   07/07/16 0936 07/07/16 0938  BP: 111/74 124/83  Pulse: 82   Resp: 16   Temp: 97.8 F (36.6 C)   TempSrc: Oral   SpO2: 93%   Weight: 206 lb (93.4 kg)   Height: 5\' 10"  (1.778 m)    Body mass index is 29.56 kg/m.  General:  WDWN in NAD Gait: slight limp HENT: WNL Eyes: Pupils equal Pulmonary: normal non-labored breathing, distant breath sounds in posterior fields, no rales or rhonchi,   + transient wheezing, + occasional moist cough.  Cardiac: RRR, no murmur detected  Abdomen: soft, NT, + small reducible ventral hernia.  Skin: no rashes, no ulcers;  no cellulitis.   VASCULAR EXAM  Carotid Bruits Right Left   Negative positive      Radial pulses are 2+ palpable bilaterally.  Aorta is not palpable.                       VASCULAR EXAM: Extremities without ischemic changes, without Gangrene;  without open wounds.                                                                                                          LE Pulses Right Left       FEMORAL  not palpable  1+ palpable        POPLITEAL  not palpable   not palpable       POSTERIOR TIBIAL  not palpable   not palpable        DORSALIS PEDIS      ANTERIOR TIBIAL not palpable  not palpable     Musculoskeletal: no muscle wasting or atrophy; no peripheral edema  Neurologic: A&O X 3; Appropriate Affect ;  SENSATION: normal; MOTOR FUNCTION: 5/5 Symmetric, CN 2-12 intact Speech is fluent/normal     Non-Invasive Vascular Imaging (06/19/16):   CEREBROVASCULAR DUPLEX EVALUATION    INDICATION: Carotid disease    PREVIOUS INTERVENTION(S): Left carotid-subclavian artery bypass graft on 04/03/13    DUPLEX EXAM:     RIGHT  LEFT  Peak Systolic Velocities (cm/s) End Diastolic Velocities (cm/s) Plaque LOCATION Peak Systolic Velocities (cm/s) End Diastolic Velocities (cm/s) Plaque  165 32  CCA PROXIMAL 170 26   94 27  CCA MID 85 22 HM  93 30  CCA DISTAL 80 26   135 25  ECA 134 28   109 34 HT ICA PROXIMAL 77 34 HT  94 39  ICA MID 77 39   77 34  ICA DISTAL 59 29     Not Calculated ICA / CCA Ratio (PSV) Not Calculated  Antegrade Vertebral Flow Antegrade   Brachial Systolic Pressure (mmHg)   Multiphasic (subclavian artery) Brachial Artery Waveforms Multiphasic (subclavian artery)    Plaque Morphology:  HM = Homogeneous, HT = Heterogeneous, CP = Calcific Plaque, SP = Smooth Plaque, IP = Irregular Plaque     ADDITIONAL FINDINGS: No significant stenosis of the bilateral external or common carotid arteries. Patent left common carotid to subclavian artery bypass graft noted.    IMPRESSION: Doppler velocities suggest 1-39% bilateral proximal internal carotid artery stenoses.    Compared to the previous exam:  No significant change noted when compared to the previous exam on 02/26/12.        LOWER EXTREMITY ARTERIAL  DUPLEX EVALUATION    INDICATION: Right leg claudication    PREVIOUS INTERVENTION(S): Aorto-bi-femoral bypass graft on 01/16/13    DUPLEX EXAM: Limited    RIGHT  LEFT   Peak Systolic Velocity (cm/s) Ratio (if abnormal) Waveform  Peak Systolic Velocity (cm/s) Ratio (if abnormal) Waveform  Dis.BPG=111 Dis.Anast.=251 CFA=239  2.3 T T B Common Femoral Artery     Not Obtained   Deep Femoral Artery     48  B Superficial Femoral Artery Proximal     31137 4.4 B Superficial Femoral Artery Mid     35  M Superficial Femoral Artery Distal     28  M Popliteal Artery     27  M Posterior Tibial Artery Dist     11  M Anterior  Tibial Artery Distal     13  M Peroneal Artery Distal     0.73 Today's ABI / TBI 1.02  1.16 Previous ABI / TBI (02/17/13 ) 1.18    Waveform:    M - Monophasic       B - Biphasic       T - Triphasic  If Ankle Brachial Index (ABI) or Toe Brachial Index (TBI) performed, please see complete report     ADDITIONAL FINDINGS: . Near-occlusive plaque noted throughout the right proximal/mid to mid/distal superficial femoral artery with collaterals visualized. . Elevated velocity noted at the distal anastamosis of the right aorto-femoral bypass graft which may be due to homogenous plaque and angle of vessel takeoff. . Triphasic flow noted in the left distal aorta-femoral bypass graft.     IMPRESSION: 1. Doppler velocities suggest a 50-70% stenosis of the right aorta-femoral bypass graft distal anastomosis and a greater than 50% stenoses of the right proximal/mid to mid/distal superficial femoral artery. 2. Comparison to the previous exam is noted on the second page of this report.       Compared to the previous exam:  Progression of disease noted in the right superficial femoral artery when compared to the previous exam on 12/16/12 with no previous duplex of the right aorto-bifemoral bypass graft available.       ASSESSMENT:  Ryan [name] is a 50 y.o. male who is s/p left  carotid-subclavian artery bypass graft on 04/03/13 and s/p Aorto-bi-femoral bypass graft on 01/16/13.  He has moderate/severe claudication in his right calf, no rest pain, no signs of ischemia in his feet/legs, no claudication symptoms in his left leg. He has no steal sx's in his left upper extremity. His atherosclerotic risk factors include uncontrolled DM and active smoking.   DATA: Carotid duplex suggests a patent left common carotid to subclavian artery bypass graft and 1-39% bilateral proximal internal carotid artery stenoses. No significant change noted when compared to the previous exam on 02/26/12.   Right LE arterial duplex indicates  50-70% stenosis of the right aorta-femoral bypass graft distal anastomosis and a greater than 50% stenoses of the right proximal/mid to mid/distal superficial femoral artery. Progression of disease noted in the right superficial femoral artery when compared to the previous exam on 12/16/12.  Face to face time with patient was 25 minutes. Over 50% of this time was spent on counseling and coordination of care.   PLAN:   The patient was counseled re smoking cessation and given several free resources re smoking cessation.   Based on today's exam and non-invasive vascular lab results, and after discussing with Dr. Imogene Burn, the patient will follow up in 3 months with the following tests: right LE arterial duplex, right aortoiliac duplex, and ABI's; see me on a day that Dr. Myra Gianotti is in the office. 1 year for carotid duplex. I discussed in depth with the patient the nature of atherosclerosis, and emphasized the importance of maximal medical management including strict control of blood pressure, blood glucose, and lipid levels, obtaining regular exercise, and cessation of smoking.  The patient is aware that without maximal medical management the underlying atherosclerotic disease process will progress, limiting the benefit of any interventions.  The patient was given  information about stroke prevention and what symptoms should prompt the patient to seek immediate medical care.  The patient was given information about PAD including signs, symptoms, treatment, what symptoms should prompt the patient to seek immediate medical care, and risk reduction measures to  take. Thank you for allowing [place] to participate in this patient's care.  Charisse March, RN, MSN, FNP-C [facility] Office: [phone]  Clinic MD: Imogene Burn 07/07/2016 9:54 AM

## 2016-07-07 NOTE — Patient Instructions (Signed)
Stroke Prevention Some medical conditions and behaviors are associated with an increased chance of having a stroke. You may prevent a stroke by making healthy choices and managing medical conditions. HOW CAN I REDUCE MY RISK OF HAVING A STROKE?   Stay physically active. Get at least 30 minutes of activity on most or all days.  Do not smoke. It may also be helpful to avoid exposure to secondhand smoke.  Limit alcohol use. Moderate alcohol use is considered to be:  No more than 2 drinks per day for men.  No more than 1 drink per day for nonpregnant women.  Eat healthy foods. This involves:  Eating 5 or more servings of fruits and vegetables a day.  Making dietary changes that address high blood pressure (hypertension), high cholesterol, diabetes, or obesity.  Manage your cholesterol levels.  Making food choices that are high in fiber and low in saturated fat, trans fat, and cholesterol may control cholesterol levels.  Take any prescribed medicines to control cholesterol as directed by your health care provider.  Manage your diabetes.  Controlling your carbohydrate and sugar intake is recommended to manage diabetes.  Take any prescribed medicines to control diabetes as directed by your health care provider.  Control your hypertension.  Making food choices that are low in salt (sodium), saturated fat, trans fat, and cholesterol is recommended to manage hypertension.  Ask your health care provider if you need treatment to lower your blood pressure. Take any prescribed medicines to control hypertension as directed by your health care provider.  If you are 18-39 years of age, have your blood pressure checked every 3-5 years. If you are 40 years of age or older, have your blood pressure checked every year.  Maintain a healthy weight.  Reducing calorie intake and making food choices that are low in sodium, saturated fat, trans fat, and cholesterol are recommended to manage  weight.  Stop drug abuse.  Avoid taking birth control pills.  Talk to your health care provider about the risks of taking birth control pills if you are over 35 years old, smoke, get migraines, or have ever had a blood clot.  Get evaluated for sleep disorders (sleep apnea).  Talk to your health care provider about getting a sleep evaluation if you snore a lot or have excessive sleepiness.  Take medicines only as directed by your health care provider.  For some people, aspirin or blood thinners (anticoagulants) are helpful in reducing the risk of forming abnormal blood clots that can lead to stroke. If you have the irregular heart rhythm of atrial fibrillation, you should be on a blood thinner unless there is a good reason you cannot take them.  Understand all your medicine instructions.  Make sure that other conditions (such as anemia or atherosclerosis) are addressed. SEEK IMMEDIATE MEDICAL CARE IF:   You have sudden weakness or numbness of the face, arm, or leg, especially on one side of the body.  Your face or eyelid droops to one side.  You have sudden confusion.  You have trouble speaking (aphasia) or understanding.  You have sudden trouble seeing in one or both eyes.  You have sudden trouble walking.  You have dizziness.  You have a loss of balance or coordination.  You have a sudden, severe headache with no known cause.  You have new chest pain or an irregular heartbeat. Any of these symptoms may represent a serious problem that is an emergency. Do not wait to see if the symptoms will   go away. Get medical help at once. Call your local emergency services (911 in [place] Do not drive yourself to the hospital.   This information is not intended to replace advice given to you by your health care provider. Make sure you discuss any questions you have with your health care provider.   Document Released: 12/28/2004 Document Revised: 12/11/2014 Document Reviewed:  05/23/2013 Elsevier Interactive Patient Education 2016 [employer]    Peripheral Vascular Disease Peripheral vascular disease (PVD) is a disease of the blood vessels that are not part of your heart and brain. A simple term for PVD is poor circulation. In most cases, PVD narrows the blood vessels that carry blood from your heart to the rest of your body. This can result in a decreased supply of blood to your arms, legs, and internal organs, like your stomach or kidneys. However, it most often affects a person's lower legs and feet. There are two types of PVD.  Organic PVD. This is the more common type. It is caused by damage to the structure of blood vessels.  Functional PVD. This is caused by conditions that make blood vessels contract and tighten (spasm). Without treatment, PVD tends to get worse over time. PVD can also lead to acute ischemic limb. This is when an arm or limb suddenly has trouble getting enough blood. This is a medical emergency. CAUSES Each type of PVD has many different causes. The most common cause of PVD is buildup of a fatty material (plaque) inside of your arteries (atherosclerosis). Small amounts of plaque can break off from the walls of the blood vessels and become lodged in a smaller artery. This blocks blood flow and can cause acute ischemic limb. Other common causes of PVD include:  Blood clots that form inside of blood vessels.  Injuries to blood vessels.  Diseases that cause inflammation of blood vessels or cause blood vessel spasms.  Health behaviors and health history that increase your risk of developing PVD. RISK FACTORS  You may have a greater risk of PVD if you:  Have a family history of PVD.  Have certain medical conditions, including:  High cholesterol.  Diabetes.  High blood pressure (hypertension).  Coronary heart disease.  Past problems with blood clots.  Past injury, such as burns or a broken bone. These may have damaged blood  vessels in your limbs.  Buerger disease. This is caused by inflamed blood vessels in your hands and feet.  Some forms of arthritis.  Rare birth defects that affect the arteries in your legs.  Use tobacco.  Do not get enough exercise.  Are obese.  Are age 50 or older. SIGNS AND SYMPTOMS  PVD may cause many different symptoms. Your symptoms depend on what part of your body is not getting enough blood. Some common signs and symptoms include:  Cramps in your lower legs. This may be a symptom of poor leg circulation (claudication).  Pain and weakness in your legs while you are physically active that goes away when you rest (intermittent claudication).  Leg pain when at rest.  Leg numbness, tingling, or weakness.  Coldness in a leg or foot, especially when compared with the other leg.  Skin or hair changes. These can include:  Hair loss.  Shiny skin.  Pale or bluish skin.  Thick toenails.  Inability to get or maintain an erection (erectile dysfunction). People with PVD are more prone to developing ulcers and sores on their toes, feet, or legs. These may take longer than   normal to heal. DIAGNOSIS Your health care provider may diagnose PVD from your signs and symptoms. The health care provider will also do a physical exam. You may have tests to find out what is causing your PVD and determine its severity. Tests may include:  Blood pressure recordings from your arms and legs and measurements of the strength of your pulses (pulse volume recordings).  Imaging studies using sound waves to take pictures of the blood flow through your blood vessels (Doppler ultrasound).  Injecting a dye into your blood vessels before having imaging studies using:  X-rays (angiogram or arteriogram).  Computer-generated X-rays (CT angiogram).  A powerful electromagnetic field and a computer (magnetic resonance angiogram or MRA). TREATMENT Treatment for PVD depends on the cause of your condition  and the severity of your symptoms. It also depends on your age. Underlying causes need to be treated and controlled. These include long-lasting (chronic) conditions, such as diabetes, high cholesterol, and high blood pressure. You may need to first try making lifestyle changes and taking medicines. Surgery may be needed if these do not work. Lifestyle changes may include:  Quitting smoking.  Exercising regularly.  Following a low-fat, low-cholesterol diet. Medicines may include:  Blood thinners to prevent blood clots.  Medicines to improve blood flow.  Medicines to improve your blood cholesterol levels. Surgical procedures may include:  A procedure that uses an inflated balloon to open a blocked artery and improve blood flow (angioplasty).  A procedure to put in a tube (stent) to keep a blocked artery open (stent implant).  Surgery to reroute blood flow around a blocked artery (peripheral bypass surgery).  Surgery to remove dead tissue from an infected wound on the affected limb.  Amputation. This is surgical removal of the affected limb. This may be necessary in cases of acute ischemic limb that are not improved through medical or surgical treatments. HOME CARE INSTRUCTIONS  Take medicines only as directed by your health care provider.  Do not use any tobacco products, including cigarettes, chewing tobacco, or electronic cigarettes. If you need help quitting, ask your health care provider.  Lose weight if you are overweight, and maintain a healthy weight as directed by your health care provider.  Eat a diet that is low in fat and cholesterol. If you need help, ask your health care provider.  Exercise regularly. Ask your health care provider to suggest some good activities for you.  Use compression stockings or other mechanical devices as directed by your health care provider.  Take good care of your feet.  Wear comfortable shoes that fit well.  Check your feet often for  any cuts or sores. SEEK MEDICAL CARE IF:  You have cramps in your legs while walking.  You have leg pain when you are at rest.  You have coldness in a leg or foot.  Your skin changes.  You have erectile dysfunction.  You have cuts or sores on your feet that are not healing. SEEK IMMEDIATE MEDICAL CARE IF:  Your arm or leg turns cold and blue.  Your arms or legs become red, warm, swollen, painful, or numb.  You have chest pain or trouble breathing.  You suddenly have weakness in your face, arm, or leg.  You become very confused or lose the ability to speak.  You suddenly have a very bad headache or lose your vision.   This information is not intended to replace advice given to you by your health care provider. Make sure you discuss any questions   you have with your health care provider.   Document Released: 12/28/2004 Document Revised: 12/11/2014 Document Reviewed: 04/30/2014 Elsevier Interactive Patient Education 2016 [employer]     Steps to Quit Smoking  Smoking tobacco can be harmful to your health and can affect almost every organ in your body. Smoking puts you, and those around you, at risk for developing many serious chronic diseases. Quitting smoking is difficult, but it is one of the best things that you can do for your health. It is never too late to quit. WHAT ARE THE BENEFITS OF QUITTING SMOKING? When you quit smoking, you lower your risk of developing serious diseases and conditions, such as:  Lung cancer or lung disease, such as COPD.  Heart disease.  Stroke.  Heart attack.  Infertility.  Osteoporosis and bone fractures. Additionally, symptoms such as coughing, wheezing, and shortness of breath may get better when you quit. You may also find that you get sick less often because your body is stronger at fighting off colds and infections. If you are pregnant, quitting smoking can help to reduce your chances of having a baby of low birth weight. HOW DO  I GET READY TO QUIT? When you decide to quit smoking, create a plan to make sure that you are successful. Before you quit:  Pick a date to quit. Set a date within the next two weeks to give you time to prepare.  Write down the reasons why you are quitting. Keep this list in places where you will see it often, such as on your bathroom mirror or in your car or wallet.  Identify the people, places, things, and activities that make you want to smoke (triggers) and avoid them. Make sure to take these actions:  Throw away all cigarettes at home, at work, and in your car.  Throw away smoking accessories, such as ashtrays and lighters.  Clean your car and make sure to empty the ashtray.  Clean your home, including curtains and carpets.  Tell your family, friends, and coworkers that you are quitting. Support from your loved ones can make quitting easier.  Talk with your health care provider about your options for quitting smoking.  Find out what treatment options are covered by your health insurance. WHAT STRATEGIES CAN I USE TO QUIT SMOKING?  Talk with your healthcare provider about different strategies to quit smoking. Some strategies include:  Quitting smoking altogether instead of gradually lessening how much you smoke over a period of time. Research shows that quitting "cold turkey" is more successful than gradually quitting.  Attending in-person counseling to help you build problem-solving skills. You are more likely to have success in quitting if you attend several counseling sessions. Even short sessions of 10 minutes can be effective.  Finding resources and support systems that can help you to quit smoking and remain smoke-free after you quit. These resources are most helpful when you use them often. They can include:  Online chats with a counselor.  Telephone quitlines.  Printed self-help materials.  Support groups or group counseling.  Text messaging programs.  Mobile phone  applications.  Taking medicines to help you quit smoking. (If you are pregnant or breastfeeding, talk with your health care provider first.) Some medicines contain nicotine and some do not. Both types of medicines help with cravings, but the medicines that include nicotine help to relieve withdrawal symptoms. Your health care provider may recommend:  Nicotine patches, gum, or lozenges.  Nicotine inhalers or sprays.  Non-nicotine medicine that   is taken by mouth. Talk with your health care provider about combining strategies, such as taking medicines while you are also receiving in-person counseling. Using these two strategies together makes you more likely to succeed in quitting than if you used either strategy on its own. If you are pregnant or breastfeeding, talk with your health care provider about finding counseling or other support strategies to quit smoking. Do not take medicine to help you quit smoking unless told to do so by your health care provider. WHAT THINGS CAN I DO TO MAKE IT EASIER TO QUIT? Quitting smoking might feel overwhelming at first, but there is a lot that you can do to make it easier. Take these important actions:  Reach out to your family and friends and ask that they support and encourage you during this time. Call telephone quitlines, reach out to support groups, or work with a counselor for support.  Ask people who smoke to avoid smoking around you.  Avoid places that trigger you to smoke, such as bars, parties, or smoke-break areas at work.  Spend time around people who do not smoke.  Lessen stress in your life, because stress can be a smoking trigger for some people. To lessen stress, try:  Exercising regularly.  Deep-breathing exercises.  Yoga.  Meditating.  Performing a body scan. This involves closing your eyes, scanning your body from head to toe, and noticing which parts of your body are particularly tense. Purposefully relax the muscles in those  areas.  Download or purchase mobile phone or tablet apps (applications) that can help you stick to your quit plan by providing reminders, tips, and encouragement. There are many free apps, such as QuitGuide from the CDC (Centers for Disease Control and Prevention). You can find other support for quitting smoking (smoking cessation) through smokefree.gov and other websites. HOW WILL I FEEL WHEN I QUIT SMOKING? Within the first 24 hours of quitting smoking, you may start to feel some withdrawal symptoms. These symptoms are usually most noticeable 2-3 days after quitting, but they usually do not last beyond 2-3 weeks. Changes or symptoms that you might experience include:  Mood swings.  Restlessness, anxiety, or irritation.  Difficulty concentrating.  Dizziness.  Strong cravings for sugary foods in addition to nicotine.  Mild weight gain.  Constipation.  Nausea.  Coughing or a sore throat.  Changes in how your medicines work in your body.  A depressed mood.  Difficulty sleeping (insomnia). After the first 2-3 weeks of quitting, you may start to notice more positive results, such as:  Improved sense of smell and taste.  Decreased coughing and sore throat.  Slower heart rate.  Lower blood pressure.  Clearer skin.  The ability to breathe more easily.  Fewer sick days. Quitting smoking is very challenging for most people. Do not get discouraged if you are not successful the first time. Some people need to make many attempts to quit before they achieve long-term success. Do your best to stick to your quit plan, and talk with your health care provider if you have any questions or concerns.   This information is not intended to replace advice given to you by your health care provider. Make sure you discuss any questions you have with your health care provider.   Document Released: 11/14/2001 Document Revised: 04/06/2015 Document Reviewed: 04/06/2015 Elsevier Interactive Patient  Education 2016 [employer]  

## 2016-07-21 NOTE — Addendum Note (Signed)
Addended by: Dannielle Karvonen on: 07/21/2016 01:36 PM   Modules accepted: Orders

## 2016-08-24 NOTE — Progress Notes (Deleted)
No chief complaint on file.     History of Present Illness: 50 yo WM with history of ongoing tobacco abuse, CAD with anterior STEMI January 2013 s/p DES LAD x 1, HLD, PAD here today for cardiac f/u. He presented to the [facility] ED on 12/12/11 with c/o chest pain. He was found to have anterior ST segment elevation and had cardiac arrest in the [facility] ED. He was transported emergently to [facility] where I met him in the cath lab. Cath showed subtotal occlusion of the mid LAD. This was treated with a PTCA and placement of a 2.75 x 32 mm Promus Element DES and post-dilated with a 3.0 mm balloon. The third OM branch was chronically occluded and the RCA had mild disease. His post MI LVEF was 35%. He did well post MI and was discharged home with a LifeVest. I saw him 01/24/12 for follow up and he had c/o pain in both legs with walking. He was found to have occlusion of the distal aorta. His left subclavian was occluded at the ostium. He underwent aorto-bifemoral bypass surgery in February 2014 and left carotid to subclavian artery bypass May 2014. Echo March 2013 with normal LV function.  He is still followed in [facility] by Dr. Azzie Almas [facility] December 2015 with no ischemia, prior to hand surgery.    He is here today for follow up.  Primary Care Physician: Philis Fendt, MD  Past Medical History:  Diagnosis Date  . Anal fissure   . Bruises easily    d/t being on Effient  . Coronary atherosclerosis of native coronary artery    takes Effient but on hold for surgery  . Diabetes mellitus without complication ([place])    takes Lantus daily  . GERD (gastroesophageal reflux disease)    "takes tums"  . History of blood transfusion    no abnormal reaction noted  . History of kidney stones   . Hypertension    takes Enalapril and Coreg daily   . MI (myocardial infarction) ([place])    AMI 6/80 - complicated by VT/Tosades  . Mixed hyperlipidemia    takes Simvastatin daily  . PAD (peripheral artery  disease) ([place])   . Rectal polyp     Past Surgical History:  Procedure Laterality Date  . ANAL FISSURECTOMY    . AORTA - BILATERAL FEMORAL ARTERY BYPASS GRAFT N/A 01/16/2013   Procedure: AORTA BIFEMORAL BYPASS GRAFT;  Surgeon: Serafina Mitchell, MD;  Location: [place];  Service: Vascular;  Laterality: N/A;  . APPENDECTOMY    . CAROTID-SUBCLAVIAN BYPASS GRAFT Left 04/03/2013   Procedure: BYPASS GRAFT CAROTID-SUBCLAVIAN;  Surgeon: Serafina Mitchell, MD;  Location: [place];  Service: Vascular;  Laterality: Left;  . CIRCUMCISION N/A 12/15/2015   Procedure: CIRCUMCISION ADULT;  Surgeon: Cleon Gustin, MD;  Location: [facility];  Service: Urology;  Laterality: N/A;  . COLONOSCOPY    . CORONARY ANGIOPLASTY     stent placed Dec 12, 2011  . DIAGNOSTIC LAPAROSCOPY    . I&D EXTREMITY Left 04/18/2013   Procedure: debridement of left neck lymphocele;  Surgeon: Serafina Mitchell, MD;  Location: [place];  Service: Vascular;  Laterality: Left;  I and D of left neck  . I&D EXTREMITY Right 11/21/2014   Procedure: IRRIGATION AND DEBRIDEMENT RIGHT HAND;  Surgeon: Roseanne Kaufman, MD;  Location: [place];  Service: Orthopedics;  Laterality: Right;  . LEFT HEART CATHETERIZATION WITH CORONARY ANGIOGRAM N/A 12/12/2011   Procedure: LEFT HEART CATHETERIZATION WITH  CORONARY ANGIOGRAM;  Surgeon: Burnell Blanks, MD;  Location: [facility] CATH LAB;  Service: Cardiovascular;  Laterality: N/A;  . LOWER EXTREMITY ANGIOGRAM N/A 01/31/2012   Procedure: LOWER EXTREMITY ANGIOGRAM;  Surgeon: Burnell Blanks, MD;  Location: [facility] CATH LAB;  Service: Cardiovascular;  Laterality: N/A;  . PERCUTANEOUS CORONARY STENT INTERVENTION (PCI-S) Right 12/12/2011   Procedure: PERCUTANEOUS CORONARY STENT INTERVENTION (PCI-S);  Surgeon: Burnell Blanks, MD;  Location: [facility] CATH LAB;  Service: Cardiovascular;  Laterality: Right;  . REPAIR EXTENSOR TENDON Right 11/21/2014   Procedure: WITH REPAIR/RECONSTRUCTION OF EXTENSOR TENDONS AS NEEDED;  Surgeon: Roseanne Kaufman, MD;  Location: [place];  Service: Orthopedics;  Laterality: Right;    Current Outpatient Prescriptions  Medication Sig Dispense Refill  . aspirin EC 81 MG tablet Take 162 mg by mouth daily.    . carvedilol (COREG) 25 MG tablet Take 25 mg by mouth 2 (two) times daily with a meal.    . carvedilol (COREG) 3.125 MG tablet TAKE ONE TABLET BY MOUTH TWICE A DAY WITH MEALS 60 tablet 2  . clopidogrel (PLAVIX) 75 MG tablet Take 1 tablet (75 mg total) by mouth daily. 90 tablet 3  . diazepam (VALIUM) 5 MG tablet Take 1 tablet (5 mg total) by mouth at bedtime. (Patient not taking: Reported on 07/07/2016) 14 tablet 0  . enalapril (VASOTEC) 5 MG tablet TAKE ONE TABLET BY MOUTH TWICE DAILY 60 tablet 2  . Insulin Glargine (LANTUS SOLOSTAR) 100 UNIT/ML Solostar Pen Inject 45 Units into the skin at bedtime.     . isosorbide mononitrate (IMDUR) 30 MG 24 hr tablet Take 1 tablet (30 mg total) by mouth daily. 90 tablet 3  . nitroGLYCERIN (NITROSTAT) 0.4 MG SL tablet Place 1 tablet (0.4 mg total) under the tongue every 5 (five) minutes as needed for chest pain. 25 tablet 6  . oxyCODONE (ROXICODONE) 15 MG immediate release tablet Take 1 tablet (15 mg total) by mouth every 4 (four) hours as needed for severe pain. 30 tablet 0  . simvastatin (ZOCOR) 40 MG tablet Take 40 mg by mouth at bedtime.    Marland Kitchen spironolactone (ALDACTONE) 25 MG tablet Take 0.5 tablets (12.5 mg total) by mouth daily. 15 tablet 11  . sulfamethoxazole-trimethoprim (BACTRIM DS,SEPTRA DS) 800-160 MG tablet Take 1 tablet by mouth 2 (two) times daily. (Patient not taking: Reported on 07/07/2016) 14 tablet 0  . varenicline (CHANTIX CONTINUING MONTH PAK) 1 MG tablet Take as directed (Patient not taking: Reported on 07/07/2016) 60 tablet 1  . varenicline (CHANTIX STARTING MONTH PAK) 0.5 MG X 11 & 1 MG X 42 tablet Take as directed (Patient not taking: Reported on 07/07/2016) 53 tablet 0   No current facility-administered medications for this visit.     Allergies   Allergen Reactions  . Hydrocodone Nausea And Vomiting    Social History   Social History  . Marital status: Divorced    Spouse name: N/A  . Number of children: N/A  . Years of education: N/A   Occupational History  .  Other   Social History Main Topics  . Smoking status: Current Every Day Smoker    Packs/day: 0.25    Years: 25.00    Types: Cigarettes  . Smokeless tobacco: Never Used  . Alcohol use No  . Drug use: No  . Sexual activity: Yes   Other Topics Concern  . Not on file   Social History Narrative   [employer] - physical labor  - works  night shift          Family History  Problem Relation Age of Onset  . Cancer Father     Lung  . Cancer Maternal Uncle     Colon  . Diabetes Mother   . COPD Mother     Review of Systems:  As stated in the HPI and otherwise negative.   There were no vitals taken for this visit.  Physical Examination: General: Well developed, well nourished, NAD  HEENT: OP clear, mucus membranes moist  SKIN: warm, dry. No rashes. Neuro: No focal deficits  Musculoskeletal: Muscle strength 5/5 all ext  Psychiatric: Mood and affect normal  Neck: No JVD, no carotid bruits, no thyromegaly, no lymphadenopathy.  Lungs:Clear bilaterally, no wheezes, rhonci, crackles Cardiovascular: Regular rate and rhythm. No murmurs, gallops or rubs. Abdomen:Soft. Bowel sounds present. Non-tender.  Extremities: No lower extremity edema. Pulses are 2 + in the bilateral DP/PT.  EKG:  EKG is *** ordered today. The ekg ordered today demonstrates    Recent Labs: 12/14/2015: BUN 15; Creatinine, Ser 0.94; Hemoglobin 17.2; Platelets 183; Potassium 4.2; Sodium 135   Lipid Panel    Component Value Date/Time   CHOL 237 (H) 12/13/2011 0607   TRIG 329 (H) 12/13/2011 0607   HDL 30 (L) 12/13/2011 0607   CHOLHDL 7.9 12/13/2011 0607   VLDL 66 (H) 12/13/2011 0607   LDLCALC 141 (H) 12/13/2011 0607     Wt Readings from Last 3 Encounters:  07/07/16  93.4 kg (206 lb)  12/15/15 96.6 kg (213 lb)  12/14/15 96.6 kg (213 lb)     Other studies Reviewed: Additional studies/ records that were reviewed today include: . Review of the above records demonstrates:    Assessment and Plan:   1. PAD: Stable. S/p aorto-bifemoral bypass and left carotid to subclavian artery bypass. Recent visit in [facility] August 2017. Surveillance studies were performed.    2. CAD: Stable. He is s/p anterior STEMI January 2013 with occluded LAD treated with bare metal stent. Stress myoview December 2015 without ischemia. He is having no chest pain. He has been on dual antiplatelet therapy with ASA and Plavix. Continue statin and beta blocker. Lipids are followed in primary care   3. Tobacco abuse: Complete cessation encouraged. He is trying to stop. I have spent ten minutes on counseling to stop. *** Will prescribe Chantix.   Current medicines are reviewed at length with the patient today.  The patient does not have concerns regarding medicines.  The following changes have been made:  no change  Labs/ tests ordered today include:  No orders of the defined types were placed in this encounter.   Disposition:   FU with me in 12  months  Signed, [name], MD 08/24/2016 9:56 PM    [facility] [place], [place], [place]  [address] Phone: [phone]; Fax: [phone]

## 2016-08-25 ENCOUNTER — Telehealth: Payer: Self-pay | Admitting: Cardiovascular Disease

## 2016-08-25 ENCOUNTER — Ambulatory Visit: Payer: Medicaid Other | Admitting: Cardiovascular Disease

## 2016-08-25 NOTE — Telephone Encounter (Signed)
No show for appt today. cdm 

## 2016-10-10 NOTE — Progress Notes (Signed)
Chief Complaint  Patient presents with  . Chest Pain     History of Present Illness: 50 yo WM with history of ongoing tobacco abuse, CAD, HLD, PAD here today for cardiac f/u. He presented to the [facility] ED on 12/12/11 with c/o chest pain. He was found to have anterior ST segment elevation and had cardiac arrest in the [facility] ED. He was transported emergently to [facility] where I met him in the cath lab. Cath showed subtotal occlusion of the mid LAD. This was treated with a 2.75 x 32 mm Promus Element DES. The third OM branch was chronically occluded and the RCA had mild disease. His post MI LVEF was 35%. He did well post MI and was discharged home with a LifeVest. I saw him 01/24/12 for follow up and he had c/o pain in both legs with walking. Non-invasive testing with aortoiliac inflow disease. There also appeared to be left subclavian artery stenosis on non-invasive imaging. I arranged a distal aortogram with bilateral lower ext runoff and left subclavian angiography on 01/31/12. His distal aorta was occluded just distal to the renals. His left subclavian was occluded at the ostium. Follow up echo 02/16/12 with normal LV function. His Lifevest was d/c'ed. He was seen by Dr. Annamarie Major in [facility] clinic and had aorto-bifemoral bypass surgery in February 2014 and left carotid to subclavian artery bypass May 2014. He is still followed in [facility] by Dr. Azzie Almas [facility] December 2015 with no ischemia, prior to hand surgery.    He is here today for follow up. He describes exertional chest pains with dyspnea. His baseline dyspnea is worsened over last few months. No energy. He is smoking 10 cigarettes per day. Right leg pain is increasing. He is seeing [facility] soon for f/u.    Primary Care Physician: Philis Fendt, MD   Past Medical History:  Diagnosis Date  . Anal fissure   . Bruises easily    d/t being on Effient  . Coronary atherosclerosis of native coronary artery    takes Effient but on hold for  surgery  . Diabetes mellitus without complication ([place])    takes Lantus daily  . GERD (gastroesophageal reflux disease)    "takes tums"  . History of blood transfusion    no abnormal reaction noted  . History of kidney stones   . Hypertension    takes Enalapril and Coreg daily   . MI (myocardial infarction)    AMI 6/23 - complicated by VT/Tosades  . Mixed hyperlipidemia    takes Simvastatin daily  . PAD (peripheral artery disease) ([place])   . Rectal polyp     Past Surgical History:  Procedure Laterality Date  . ANAL FISSURECTOMY    . AORTA - BILATERAL FEMORAL ARTERY BYPASS GRAFT N/A 01/16/2013   Procedure: AORTA BIFEMORAL BYPASS GRAFT;  Surgeon: Serafina Mitchell, MD;  Location: [place];  Service: Vascular;  Laterality: N/A;  . APPENDECTOMY    . CAROTID-SUBCLAVIAN BYPASS GRAFT Left 04/03/2013   Procedure: BYPASS GRAFT CAROTID-SUBCLAVIAN;  Surgeon: Serafina Mitchell, MD;  Location: [place];  Service: Vascular;  Laterality: Left;  . CIRCUMCISION N/A 12/15/2015   Procedure: CIRCUMCISION ADULT;  Surgeon: Cleon Gustin, MD;  Location: [facility];  Service: Urology;  Laterality: N/A;  . COLONOSCOPY    . CORONARY ANGIOPLASTY     stent placed Dec 12, 2011  . DIAGNOSTIC LAPAROSCOPY    . I&D EXTREMITY Left 04/18/2013   Procedure: debridement of left neck  lymphocele;  Surgeon: Serafina Mitchell, MD;  Location: [place];  Service: Vascular;  Laterality: Left;  I and D of left neck  . I&D EXTREMITY Right 11/21/2014   Procedure: IRRIGATION AND DEBRIDEMENT RIGHT HAND;  Surgeon: Roseanne Kaufman, MD;  Location: [place];  Service: Orthopedics;  Laterality: Right;  . LEFT HEART CATHETERIZATION WITH CORONARY ANGIOGRAM N/A 12/12/2011   Procedure: LEFT HEART CATHETERIZATION WITH CORONARY ANGIOGRAM;  Surgeon: Burnell Blanks, MD;  Location: [facility] CATH LAB;  Service: Cardiovascular;  Laterality: N/A;  . LOWER EXTREMITY ANGIOGRAM N/A 01/31/2012   Procedure: LOWER EXTREMITY ANGIOGRAM;  Surgeon: Burnell Blanks, MD;   Location: [facility] CATH LAB;  Service: Cardiovascular;  Laterality: N/A;  . PERCUTANEOUS CORONARY STENT INTERVENTION (PCI-S) Right 12/12/2011   Procedure: PERCUTANEOUS CORONARY STENT INTERVENTION (PCI-S);  Surgeon: Burnell Blanks, MD;  Location: [facility] CATH LAB;  Service: Cardiovascular;  Laterality: Right;  . REPAIR EXTENSOR TENDON Right 11/21/2014   Procedure: WITH REPAIR/RECONSTRUCTION OF EXTENSOR TENDONS AS NEEDED;  Surgeon: Roseanne Kaufman, MD;  Location: [place];  Service: Orthopedics;  Laterality: Right;    Current Outpatient Prescriptions  Medication Sig Dispense Refill  . aspirin EC 81 MG tablet Take 162 mg by mouth daily.    . carvedilol (COREG) 25 MG tablet Take 25 mg by mouth 2 (two) times daily with a meal.    . clopidogrel (PLAVIX) 75 MG tablet Take 1 tablet (75 mg total) by mouth daily. 90 tablet 3  . enalapril (VASOTEC) 5 MG tablet TAKE ONE TABLET BY MOUTH TWICE DAILY 60 tablet 2  . Insulin Glargine (LANTUS SOLOSTAR) 100 UNIT/ML Solostar Pen Inject 45 Units into the skin at bedtime.     . isosorbide mononitrate (IMDUR) 30 MG 24 hr tablet Take 1 tablet (30 mg total) by mouth daily. 90 tablet 3  . nitroGLYCERIN (NITROSTAT) 0.4 MG SL tablet Place 1 tablet (0.4 mg total) under the tongue every 5 (five) minutes as needed for chest pain. 25 tablet 6  . oxyCODONE (ROXICODONE) 15 MG immediate release tablet Take 1 tablet (15 mg total) by mouth every 4 (four) hours as needed for severe pain. 30 tablet 0  . simvastatin (ZOCOR) 40 MG tablet Take 40 mg by mouth at bedtime.    Marland Kitchen spironolactone (ALDACTONE) 25 MG tablet Take 0.5 tablets (12.5 mg total) by mouth daily. 15 tablet 11  . carvedilol (COREG) 3.125 MG tablet TAKE ONE TABLET BY MOUTH TWICE A DAY WITH MEALS 60 tablet 2   No current facility-administered medications for this visit.     Allergies  Allergen Reactions  . Hydrocodone Nausea And Vomiting    Social History   Social History  . Marital status: Divorced    Spouse name: N/A    . Number of children: N/A  . Years of education: N/A   Occupational History  .  Other   Social History Main Topics  . Smoking status: Current Every Day Smoker    Packs/day: 0.25    Years: 25.00    Types: Cigarettes  . Smokeless tobacco: Never Used  . Alcohol use No  . Drug use: No  . Sexual activity: Yes   Other Topics Concern  . Not on file   Social History Narrative   [employer] - physical labor  - works night shift          Family History  Problem Relation Age of Onset  . Cancer Father     Lung  . Cancer Maternal Uncle  Colon  . Diabetes Mother   . COPD Mother     Review of Systems:  As stated in the HPI and otherwise negative.   BP 100/80   Pulse 76   Ht _0  (1.753 m)   Wt 211 lb (95.7 kg)   BMI 31.16 kg/m   Physical Examination: General: Well developed, well nourished, NAD  HEENT: OP clear, mucus membranes moist  SKIN: warm, dry. No rashes. Neuro: No focal deficits  Musculoskeletal: Muscle strength 5/5 all ext  Psychiatric: Mood and affect normal  Neck: No JVD, no carotid bruits, no thyromegaly, no lymphadenopathy.  Lungs:Clear bilaterally, no wheezes, rhonci, crackles Cardiovascular: Regular rate and rhythm. No murmurs, gallops or rubs. Abdomen:Soft. Bowel sounds present. Non-tender.  Extremities: No lower extremity edema. Pulses non-palpable right PT/DP and trace in the DP/PT.  EKG:  EKG is  ordered today. The ekg ordered today demonstrates NSR, rate 76 bpm. T wave abnormality, unchanged.   Recent Labs: 12/14/2015: BUN 15; Creatinine, Ser 0.94; Hemoglobin 17.2; Platelets 183; Potassium 4.2; Sodium 135   Lipid Panel Followed in primary care   Wt Readings from Last 3 Encounters:  10/11/16 211 lb (95.7 kg)  07/07/16 206 lb (93.4 kg)  12/15/15 213 lb (96.6 kg)     Other studies Reviewed: Additional studies/ records that were reviewed today include: . Review of the above records demonstrates:    Assessment and Plan:    1. PAD: He has had recent change in symptoms in right leg. Follow up soon in [facility]. He is s/p  Aortobifemoral bypass and left carotid to subclavian artery bypass. He is followed in [facility].   2. CAD with unstable angina: He has chest pain c/w unstable angina. He is s/p anterior STEMI January 2013 with occluded LAD treated with DES. Chronically occluded OM3. He has been on dual antiplatelet therapy with ASA and Plavix. Continue statin and beta blocker. Plan cath at [facility] 10/23/16 at 10am. Risks and benefits reviewed with pt. Pre-cath labs today.   3. Tobacco abuse: Complete cessation encouraged. He is trying to stop. I have spent ten minutes on counseling to stop. He failed Chantix.   Current medicines are reviewed at length with the patient today.  The patient does not have concerns regarding medicines.  The following changes have been made:  no change  Labs/ tests ordered today include:   Orders Placed This Encounter  Procedures  . CBC  . Basic Metabolic Panel (BMET)  . INR/PT  . EKG 12-Lead    Disposition:   FU with me in 12  months  Signed, [name], MD 10/11/2016 9:40 AM    [facility] [place], [place], [place]  [address] Phone: [phone]; Fax: [phone]

## 2016-10-11 ENCOUNTER — Encounter: Payer: Self-pay | Admitting: *Deleted

## 2016-10-11 ENCOUNTER — Encounter: Payer: Self-pay | Admitting: Cardiovascular Disease

## 2016-10-11 ENCOUNTER — Ambulatory Visit (INDEPENDENT_AMBULATORY_CARE_PROVIDER_SITE_OTHER): Payer: Medicaid Other | Admitting: Cardiovascular Disease

## 2016-10-11 VITALS — BP 100/80 | HR 76 | Ht 69.0 in | Wt 211.0 lb

## 2016-10-11 DIAGNOSIS — I739 Peripheral vascular disease, unspecified: Secondary | ICD-10-CM | POA: Diagnosis not present

## 2016-10-11 DIAGNOSIS — Z720 Tobacco use: Secondary | ICD-10-CM

## 2016-10-11 DIAGNOSIS — I25110 Atherosclerotic heart disease of native coronary artery with unstable angina pectoris: Secondary | ICD-10-CM | POA: Diagnosis not present

## 2016-10-11 LAB — CBC
HEMATOCRIT: 47.6 % (ref 38.5–50.0)
HEMOGLOBIN: 16.1 g/dL (ref 13.2–17.1)
MCH: 31.7 pg (ref 27.0–33.0)
MCHC: 33.8 g/dL (ref 32.0–36.0)
MCV: 93.7 fL (ref 80.0–100.0)
MPV: 11.3 fL (ref 7.5–12.5)
Platelets: 224 10*3/uL (ref 140–400)
RBC: 5.08 MIL/uL (ref 4.20–5.80)
RDW: 13.7 % (ref 11.0–15.0)
WBC: 10.5 10*3/uL (ref 3.8–10.8)

## 2016-10-11 LAB — BASIC METABOLIC PANEL
BUN: 14 mg/dL (ref 7–25)
CHLORIDE: 99 mmol/L (ref 98–110)
CO2: 24 mmol/L (ref 20–31)
Calcium: 9.1 mg/dL (ref 8.6–10.3)
Creat: 0.98 mg/dL (ref 0.60–1.35)
GLUCOSE: 384 mg/dL — AB (ref 65–99)
POTASSIUM: 4.1 mmol/L (ref 3.5–5.3)
SODIUM: 135 mmol/L (ref 135–146)

## 2016-10-11 LAB — PROTIME-INR
INR: 1
PROTHROMBIN TIME: 11.1 s (ref 9.0–11.5)

## 2016-10-11 NOTE — Patient Instructions (Signed)
Medication Instructions:  Your physician recommends that you continue on your current medications as directed. Please refer to the Current Medication list given to you today.   Labwork: Lab work to be done [employer], PT  Testing/Procedures: Your physician has requested that you have a cardiac catheterization. Cardiac catheterization is used to diagnose and/or treat various heart conditions. Doctors may recommend this procedure for a number of different reasons. The most common reason is to evaluate chest pain. Chest pain can be a symptom of coronary artery disease (CAD), and cardiac catheterization can show whether plaque is narrowing or blocking your heart's arteries. This procedure is also used to evaluate the valves, as well as measure the blood flow and oxygen levels in different parts of your heart. For further information please visit [email or web address] Please follow instruction sheet, as given.    Follow-Up: Your physician recommends that you schedule a follow-up appointment in:  About 4 weeks with PA or NP.    Any Other Special Instructions Will Be Listed Below (If Applicable).  Please call our office with dose of Coreg you have been taking   If you need a refill on your cardiac medications before your next appointment, please call your pharmacy.

## 2016-10-12 ENCOUNTER — Telehealth: Payer: Self-pay | Admitting: Cardiovascular Disease

## 2016-10-12 NOTE — Telephone Encounter (Signed)
I dc'd Coreg 25mg  dose from medication list.  According to current med list, pt is taking 3.125 BID.  Pt reports taking 6.25 daily. I LMTCB in regards to QD vs. BID frequency.

## 2016-10-12 NOTE — Telephone Encounter (Signed)
Pt is calling to let Dr.McAlhany know he is taking COREG 6.2

## 2016-10-16 NOTE — Addendum Note (Signed)
Addended by: Lear Ng on: 10/16/2016 12:00 PM   Modules accepted: Orders

## 2016-10-16 NOTE — Telephone Encounter (Signed)
LMTCB

## 2016-10-16 NOTE — Telephone Encounter (Signed)
Thanks. cdm 

## 2016-10-16 NOTE — Telephone Encounter (Signed)
Pt cb.  He reports taking Coreg 6.25 BID. I updated medication list.

## 2016-10-18 ENCOUNTER — Other Ambulatory Visit: Payer: Self-pay | Admitting: *Deleted

## 2016-10-18 DIAGNOSIS — I25812 Atherosclerosis of bypass graft of coronary artery of transplanted heart without angina pectoris: Secondary | ICD-10-CM

## 2016-10-18 MED ORDER — ISOSORBIDE MONONITRATE ER 30 MG PO TB24: 30.0000 mg | ORAL_TABLET | Freq: Every day | ORAL | 3 refills | Status: DC

## 2016-10-18 MED ORDER — CLOPIDOGREL BISULFATE 75 MG PO TABS: 75.0000 mg | ORAL_TABLET | Freq: Every day | ORAL | 3 refills | Status: DC

## 2016-10-18 MED ORDER — SPIRONOLACTONE 25 MG PO TABS: 12.5000 mg | ORAL_TABLET | Freq: Every day | ORAL | 3 refills | Status: DC

## 2016-10-19 MED ORDER — SPIRONOLACTONE 25 MG PO TABS: 12.5000 mg | ORAL_TABLET | Freq: Every day | ORAL | 3 refills | Status: DC

## 2016-10-19 NOTE — Addendum Note (Signed)
Addended by: Demetrios Loll on: 10/19/2016 08:14 AM   Modules accepted: Orders

## 2016-10-23 ENCOUNTER — Encounter (HOSPITAL_COMMUNITY): Payer: Self-pay | Admitting: Physician Assistant

## 2016-10-23 ENCOUNTER — Encounter (HOSPITAL_COMMUNITY)
Admission: RE | Disposition: A | Discharge status: Home / Self Care | Payer: Self-pay | Source: Ambulatory Visit | Attending: Cardiovascular Disease

## 2016-10-23 ENCOUNTER — Ambulatory Visit (HOSPITAL_COMMUNITY)
Admission: RE | Admit: 2016-10-23 | Discharge: 2016-10-23 | Disposition: A | Discharge status: Home / Self Care | Payer: Medicaid Other | Source: Ambulatory Visit | Attending: Cardiovascular Disease | Admitting: Cardiovascular Disease

## 2016-10-23 DIAGNOSIS — Y831 Surgical operation with implant of artificial internal device as the cause of abnormal reaction of the patient, or of later complication, without mention of misadventure at the time of the procedure: Secondary | ICD-10-CM | POA: Diagnosis not present

## 2016-10-23 DIAGNOSIS — Z720 Tobacco use: Secondary | ICD-10-CM | POA: Diagnosis present

## 2016-10-23 DIAGNOSIS — I10 Essential (primary) hypertension: Secondary | ICD-10-CM

## 2016-10-23 DIAGNOSIS — Z9861 Coronary angioplasty status: Secondary | ICD-10-CM

## 2016-10-23 DIAGNOSIS — IMO0002 Reserved for concepts with insufficient information to code with codable children: Secondary | ICD-10-CM

## 2016-10-23 DIAGNOSIS — I255 Ischemic cardiomyopathy: Secondary | ICD-10-CM | POA: Diagnosis present

## 2016-10-23 DIAGNOSIS — E782 Mixed hyperlipidemia: Secondary | ICD-10-CM | POA: Insufficient documentation

## 2016-10-23 DIAGNOSIS — I2582 Chronic total occlusion of coronary artery: Secondary | ICD-10-CM | POA: Diagnosis not present

## 2016-10-23 DIAGNOSIS — I739 Peripheral vascular disease, unspecified: Secondary | ICD-10-CM | POA: Diagnosis present

## 2016-10-23 DIAGNOSIS — I25110 Atherosclerotic heart disease of native coronary artery with unstable angina pectoris: Secondary | ICD-10-CM | POA: Diagnosis not present

## 2016-10-23 DIAGNOSIS — Z955 Presence of coronary angioplasty implant and graft: Secondary | ICD-10-CM | POA: Insufficient documentation

## 2016-10-23 DIAGNOSIS — Z7982 Long term (current) use of aspirin: Secondary | ICD-10-CM | POA: Insufficient documentation

## 2016-10-23 DIAGNOSIS — E1151 Type 2 diabetes mellitus with diabetic peripheral angiopathy without gangrene: Secondary | ICD-10-CM | POA: Insufficient documentation

## 2016-10-23 DIAGNOSIS — F17210 Nicotine dependence, cigarettes, uncomplicated: Secondary | ICD-10-CM | POA: Insufficient documentation

## 2016-10-23 DIAGNOSIS — I25812 Atherosclerosis of bypass graft of coronary artery of transplanted heart without angina pectoris: Secondary | ICD-10-CM

## 2016-10-23 DIAGNOSIS — E1169 Type 2 diabetes mellitus with other specified complication: Secondary | ICD-10-CM | POA: Diagnosis present

## 2016-10-23 DIAGNOSIS — Z79899 Other long term (current) drug therapy: Secondary | ICD-10-CM | POA: Diagnosis not present

## 2016-10-23 DIAGNOSIS — T82855A Stenosis of coronary artery stent, initial encounter: Secondary | ICD-10-CM | POA: Diagnosis not present

## 2016-10-23 DIAGNOSIS — K219 Gastro-esophageal reflux disease without esophagitis: Secondary | ICD-10-CM | POA: Diagnosis not present

## 2016-10-23 DIAGNOSIS — I252 Old myocardial infarction: Secondary | ICD-10-CM | POA: Diagnosis not present

## 2016-10-23 DIAGNOSIS — I2510 Atherosclerotic heart disease of native coronary artery without angina pectoris: Secondary | ICD-10-CM | POA: Diagnosis present

## 2016-10-23 DIAGNOSIS — E1165 Type 2 diabetes mellitus with hyperglycemia: Secondary | ICD-10-CM

## 2016-10-23 DIAGNOSIS — Z8674 Personal history of sudden cardiac arrest: Secondary | ICD-10-CM | POA: Insufficient documentation

## 2016-10-23 DIAGNOSIS — Z794 Long term (current) use of insulin: Secondary | ICD-10-CM | POA: Diagnosis not present

## 2016-10-23 DIAGNOSIS — Z7902 Long term (current) use of antithrombotics/antiplatelets: Secondary | ICD-10-CM | POA: Insufficient documentation

## 2016-10-23 DIAGNOSIS — E118 Type 2 diabetes mellitus with unspecified complications: Secondary | ICD-10-CM | POA: Diagnosis present

## 2016-10-23 HISTORY — PX: CARDIAC CATHETERIZATION: SHX172

## 2016-10-23 HISTORY — DX: Reserved for concepts with insufficient information to code with codable children: IMO0002

## 2016-10-23 HISTORY — DX: Ischemic cardiomyopathy: I25.5

## 2016-10-23 HISTORY — DX: Type 2 diabetes mellitus with hyperglycemia: E11.65

## 2016-10-23 HISTORY — DX: Essential (primary) hypertension: I10

## 2016-10-23 LAB — GLUCOSE, CAPILLARY
GLUCOSE-CAPILLARY: 329 mg/dL — AB (ref 65–99)
GLUCOSE-CAPILLARY: 340 mg/dL — AB (ref 65–99)
Glucose-Capillary: 295 mg/dL — ABNORMAL HIGH (ref 65–99)

## 2016-10-23 LAB — POCT ACTIVATED CLOTTING TIME: ACTIVATED CLOTTING TIME: 274 s

## 2016-10-23 SURGERY — LEFT HEART CATH AND CORONARY ANGIOGRAPHY

## 2016-10-23 MED ORDER — ANGIOPLASTY BOOK
Freq: Once | Status: DC
  Filled 2016-10-23 (×2): qty 1

## 2016-10-23 MED ORDER — MIDAZOLAM HCL 2 MG/2ML IJ SOLN
INTRAMUSCULAR | Status: DC | PRN
  Administered 2016-10-23: 2 mg via INTRAVENOUS

## 2016-10-23 MED ORDER — ASPIRIN EC 81 MG PO TBEC: 162.0000 mg | DELAYED_RELEASE_TABLET | Freq: Every day | ORAL | Status: DC

## 2016-10-23 MED ORDER — SODIUM CHLORIDE 0.9 % IV SOLN: 250.0000 mL | INTRAVENOUS | Status: DC | PRN

## 2016-10-23 MED ORDER — ENALAPRIL MALEATE 5 MG PO TABS
5.0000 mg | ORAL_TABLET | Freq: Two times a day (BID) | ORAL | Status: DC
  Filled 2016-10-23: qty 1

## 2016-10-23 MED ORDER — LIDOCAINE HCL (PF) 1 % IJ SOLN
INTRAMUSCULAR | Status: DC | PRN
  Administered 2016-10-23: 2 mL

## 2016-10-23 MED ORDER — IOPAMIDOL (ISOVUE-370) INJECTION 76%
INTRAVENOUS | Status: DC | PRN
  Administered 2016-10-23: 110 mL via INTRA_ARTERIAL

## 2016-10-23 MED ORDER — ACETAMINOPHEN 325 MG PO TABS: 650.0000 mg | ORAL_TABLET | ORAL | Status: DC | PRN

## 2016-10-23 MED ORDER — HEPARIN (PORCINE) IN NACL 2-0.9 UNIT/ML-% IJ SOLN
INTRAMUSCULAR | Status: DC | PRN
Start: 2016-10-23 — End: 2016-10-23
  Administered 2016-10-23: 1000 mL

## 2016-10-23 MED ORDER — SODIUM CHLORIDE 0.9% FLUSH: 3.0000 mL | INTRAVENOUS | Status: DC | PRN

## 2016-10-23 MED ORDER — MIDAZOLAM HCL 2 MG/2ML IJ SOLN
INTRAMUSCULAR | Status: AC
  Filled 2016-10-23: qty 2

## 2016-10-23 MED ORDER — CLOPIDOGREL BISULFATE 75 MG PO TABS: 75.0000 mg | ORAL_TABLET | Freq: Every day | ORAL | Status: DC

## 2016-10-23 MED ORDER — INSULIN ASPART 100 UNIT/ML ~~LOC~~ SOLN
0.0000 [IU] | Freq: Three times a day (TID) | SUBCUTANEOUS | Status: DC
  Administered 2016-10-23: 5 [IU] via SUBCUTANEOUS
  Administered 2016-10-23: 7 [IU] via SUBCUTANEOUS
  Filled 2016-10-23: qty 0.09

## 2016-10-23 MED ORDER — HEPARIN (PORCINE) IN NACL 2-0.9 UNIT/ML-% IJ SOLN
INTRAMUSCULAR | Status: AC
  Filled 2016-10-23: qty 500

## 2016-10-23 MED ORDER — HEPARIN SODIUM (PORCINE) 1000 UNIT/ML IJ SOLN
INTRAMUSCULAR | Status: AC
  Filled 2016-10-23: qty 1

## 2016-10-23 MED ORDER — NITROGLYCERIN 0.4 MG SL SUBL: 0.4000 mg | SUBLINGUAL_TABLET | SUBLINGUAL | 3 refills | Status: DC | PRN

## 2016-10-23 MED ORDER — LIDOCAINE HCL (PF) 1 % IJ SOLN
INTRAMUSCULAR | Status: AC
  Filled 2016-10-23: qty 30

## 2016-10-23 MED ORDER — ASPIRIN EC 81 MG PO TBEC: 81.0000 mg | DELAYED_RELEASE_TABLET | Freq: Every day | ORAL | 3 refills | Status: DC

## 2016-10-23 MED ORDER — ASPIRIN 81 MG PO CHEW: 81.0000 mg | CHEWABLE_TABLET | Freq: Every day | ORAL | Status: DC

## 2016-10-23 MED ORDER — ASPIRIN 81 MG PO CHEW
81.0000 mg | CHEWABLE_TABLET | ORAL | Status: AC
  Administered 2016-10-23: 81 mg via ORAL

## 2016-10-23 MED ORDER — INSULIN GLARGINE 100 UNIT/ML ~~LOC~~ SOLN
45.0000 [IU] | Freq: Every day | SUBCUTANEOUS | Status: DC
  Filled 2016-10-23: qty 0.45

## 2016-10-23 MED ORDER — SPIRONOLACTONE 12.5 MG HALF TABLET
12.5000 mg | ORAL_TABLET | Freq: Every day | ORAL | Status: DC
  Filled 2016-10-23: qty 1

## 2016-10-23 MED ORDER — SODIUM CHLORIDE 0.9% FLUSH: 3.0000 mL | Freq: Two times a day (BID) | INTRAVENOUS | Status: DC

## 2016-10-23 MED ORDER — SODIUM CHLORIDE 0.9 % IV SOLN: INTRAVENOUS | Status: AC

## 2016-10-23 MED ORDER — CLOPIDOGREL BISULFATE 300 MG PO TABS
ORAL_TABLET | ORAL | Status: DC | PRN
  Administered 2016-10-23: 300 mg via ORAL

## 2016-10-23 MED ORDER — INSULIN ASPART 100 UNIT/ML ~~LOC~~ SOLN
SUBCUTANEOUS | Status: AC
  Filled 2016-10-23: qty 1

## 2016-10-23 MED ORDER — SODIUM CHLORIDE 0.9 % IV SOLN
INTRAVENOUS | Status: AC
  Administered 2016-10-23: 09:00:00 via INTRAVENOUS

## 2016-10-23 MED ORDER — IOPAMIDOL (ISOVUE-370) INJECTION 76%
INTRAVENOUS | Status: AC
  Filled 2016-10-23: qty 50

## 2016-10-23 MED ORDER — HEPARIN SODIUM (PORCINE) 1000 UNIT/ML IJ SOLN
INTRAMUSCULAR | Status: DC | PRN
  Administered 2016-10-23 (×2): 5000 [IU] via INTRAVENOUS
  Administered 2016-10-23: 2000 [IU] via INTRAVENOUS

## 2016-10-23 MED ORDER — CLOPIDOGREL BISULFATE 300 MG PO TABS
ORAL_TABLET | ORAL | Status: AC
  Filled 2016-10-23: qty 1

## 2016-10-23 MED ORDER — CARVEDILOL 3.125 MG PO TABS: 6.2500 mg | ORAL_TABLET | Freq: Two times a day (BID) | ORAL | Status: DC

## 2016-10-23 MED ORDER — ISOSORBIDE MONONITRATE ER 30 MG PO TB24
30.0000 mg | ORAL_TABLET | Freq: Every day | ORAL | Status: DC
  Filled 2016-10-23: qty 1

## 2016-10-23 MED ORDER — NITROGLYCERIN 0.4 MG SL SUBL: 0.4000 mg | SUBLINGUAL_TABLET | SUBLINGUAL | Status: DC | PRN

## 2016-10-23 MED ORDER — SIMVASTATIN 40 MG PO TABS
40.0000 mg | ORAL_TABLET | Freq: Every day | ORAL | Status: DC
  Filled 2016-10-23: qty 1

## 2016-10-23 MED ORDER — VERAPAMIL HCL 2.5 MG/ML IV SOLN
INTRAVENOUS | Status: DC | PRN
  Administered 2016-10-23: 10 mL via INTRA_ARTERIAL

## 2016-10-23 MED ORDER — FENTANYL CITRATE (PF) 100 MCG/2ML IJ SOLN
INTRAMUSCULAR | Status: DC | PRN
  Administered 2016-10-23: 50 ug via INTRAVENOUS

## 2016-10-23 MED ORDER — ONDANSETRON HCL 4 MG/2ML IJ SOLN: 4.0000 mg | Freq: Four times a day (QID) | INTRAMUSCULAR | Status: DC | PRN

## 2016-10-23 MED ORDER — FENTANYL CITRATE (PF) 100 MCG/2ML IJ SOLN
INTRAMUSCULAR | Status: AC
  Filled 2016-10-23: qty 2

## 2016-10-23 MED ORDER — VERAPAMIL HCL 2.5 MG/ML IV SOLN
INTRAVENOUS | Status: AC
  Filled 2016-10-23: qty 2

## 2016-10-23 MED ORDER — ASPIRIN 81 MG PO CHEW
CHEWABLE_TABLET | ORAL | Status: AC
  Filled 2016-10-23: qty 1

## 2016-10-23 MED ORDER — INSULIN ASPART 100 UNIT/ML ~~LOC~~ SOLN
SUBCUTANEOUS | Status: AC
  Administered 2016-10-23: 5 [IU] via SUBCUTANEOUS
  Filled 2016-10-23: qty 1

## 2016-10-23 MED ORDER — IOPAMIDOL (ISOVUE-370) INJECTION 76%
INTRAVENOUS | Status: AC
  Filled 2016-10-23: qty 100

## 2016-10-23 MED ORDER — OXYCODONE HCL 5 MG PO TABS: 15.0000 mg | ORAL_TABLET | ORAL | Status: DC | PRN

## 2016-10-23 SURGICAL SUPPLY — 19 items
BALLN ANGIOSCULPT RX 3.0X10 (BALLOONS) ×2
BALLN TREK RX 2.5X12 (BALLOONS) ×2
BALLOON ANGIOSCULPT RX 3.0X10 (BALLOONS) ×1 IMPLANT
BALLOON TREK RX 2.5X12 (BALLOONS) ×1 IMPLANT
CATH INFINITI 5 FR JL3.5 (CATHETERS) ×2 IMPLANT
CATH INFINITI 5FR ANG PIGTAIL (CATHETERS) ×2 IMPLANT
CATH INFINITI JR4 5F (CATHETERS) ×2 IMPLANT
CATH VISTA GUIDE 6FR XBLAD3.5 (CATHETERS) ×2 IMPLANT
DEVICE RAD COMP TR BAND LRG (VASCULAR PRODUCTS) ×2 IMPLANT
GLIDESHEATH SLEND SS 6F .021 (SHEATH) ×2 IMPLANT
GUIDEWIRE INQWIRE 1.5J.035X260 (WIRE) ×1 IMPLANT
INQWIRE 1.5J .035X260CM (WIRE) ×2
KIT ENCORE 26 ADVANTAGE (KITS) ×2 IMPLANT
KIT HEART LEFT (KITS) ×2 IMPLANT
PACK CARDIAC CATHETERIZATION (CUSTOM PROCEDURE TRAY) ×2 IMPLANT
SYR MEDRAD MARK V 150ML (SYRINGE) ×2 IMPLANT
TRANSDUCER W/STOPCOCK (MISCELLANEOUS) ×2 IMPLANT
TUBING CIL FLEX 10 FLL-RA (TUBING) ×2 IMPLANT
WIRE COUGAR XT STRL 190CM (WIRE) ×2 IMPLANT

## 2016-10-23 NOTE — Progress Notes (Signed)
[id number] Education completed with pt and significant other who voiced understanding. Stressed importance of meds especially NTG use for angina and plavix. Gave pt and SO a fake cigarette as they both are smoking. She stated she wants to quit. He stated he would try to quit but not sure if he will be able to do so. Gave smoking cessation handout. Discussed modified ex ed due to limitation in mobility. Discussed CRP 2 and will refer to [place]. Discussed carb counting and healthy food choices. Handouts given for reference. Luetta Nutting RN BSN 10/23/2016 2:06 PM

## 2016-10-23 NOTE — Discharge Summary (Signed)
Discharge Summary    Patient ID: Ryan [name],  MRN: [id number], DOB/AGE: 05-12-1966 50 y.o.  Admit date: 10/23/2016 Discharge date: 10/23/2016  Primary Care Provider: Dorrene German Primary Cardiologist: Dr. [name]  Discharge Diagnoses    Principal Problem:   Coronary artery disease involving native coronary artery of native heart with unstable angina pectoris [facility]) Active Problems:   Tobacco abuse   Mixed hyperlipidemia   PAD (peripheral artery disease) (HCC)   Ischemic cardiomyopathy   Uncontrolled diabetes mellitus (HCC)   Essential hypertension    Diagnostic Studies/Procedures    As below _____________     History of Present Illness     Ryan [name] is a 50 y.o. male with history of ongoing tobacco abuse, CAD (ant STEMI with cardiac arrest 2013 s/p DES to mLAD), HTN, HLD, PAD (followed by Vascular - aortobifem bypass 01/2013 & left carotid-subclavian artery bypass 04/2013) who presented to [facility] for planend cardiac cath.   Hospital Course    To recap history, he presented to the [facility] ED on 12/12/11 with c/o chest pain. He was found to have anterior ST segment elevation and had cardiac arrest in the [facility] ED. He was transported emergently to [facility] where showed subtotal occlusion of the mid LAD. This was treated with a 2.75 x 32 mm Promus Element DES. The third OM branch was chronically occluded and the RCA had mild disease. His post MI LVEF was 35%. He did well post MI and was discharged home with a LifeVest. Follow up echo 02/16/12 with normal LV function so LifeVest was discontinued. He recently presented back to the office complaining of exertional chest pain with dyspnea and fatigue. He was also reporting increasing right leg pain and has follow-up with [facility] soon. He was brought in for cardiac cath today showing:  1. Double vessel CAD 2. The LAD is patent with a patent stent in the mid segment. The stented segment has a focal 80% stenosis in the mid  segment of the stent.  3. The Circumflex is patent with known total occlusion of the third obtuse marginal branch which fills from left to left collaterals.  4. The RCA is a large dominant vessel with mild mid stenosis.  5. Normal LV systolic function 55-60%  Dr. [name] performed PTCA with Angiosculpt cutting balloon to the mid-LAD. He will continue DAPT for lifetime with ASA and Plavix. He was on these prior to admission, although aspirin dose was reduced from 162 to 81mg  daily since he is also on Plavix. He said he does not need any Plavix refills but needs SL NTG refilled, thus it was sent in. He was seen by cardiac rehab for education regarding tobacco cessation, healthy lifestyle changes. He was advised to f/u with his PCP regarding his diabetes as recent blood sugars here and on pre-cath labs were in the 300 range. He has post-hospital follow-up arranged with me on 11/01/16 at 11am. Radial site was unremarkable post-cath. He was observed for 6 hours post-procedure without complication. Dr. [name] has seen and examined the patient today and feels he is stable for same-day PCI discharge.  _____________  Discharge Vitals Vital Signs. BP 112/73   Pulse 75   Temp 98.3 F (36.8 C)   Resp (!) 23   Ht 5\' 9"  (1.753 m)   Wt 216 lb (98 kg)   SpO2 98%   BMI 31.90 kg/m  General: Well developed, well nourished obese M in no acute distress. Head: Normocephalic, atraumatic, sclera non-icteric,  no xanthomas, nares are without discharge. Neck: Negative for carotid bruits. JVP not elevated. Lungs: Clear bilaterally to auscultation without wheezes, rales, or rhonchi. Breathing is unlabored. Heart: RRR S1 S2 without murmurs, rubs, or gallops.  Abdomen: Soft, non-tender, non-distended with normoactive bowel sounds. No rebound/guarding. Extremities: No clubbing or cyanosis. No edema. Distal pedal pulses are 2+ and equal bilaterally. Right radial cath site without hematoma or ecchymosis; good  pulse. Neuro: Alert and oriented X 3. Moves all extremities spontaneously. Psych:  Responds to questions appropriately with a normal affect Filed Weights   10/23/16 0809  Weight: 216 lb (98 kg)    Labs & Radiologic Studies    N/A _____________  No results found. Disposition   Pt is being discharged home today in good condition.  Follow-up Plans & Appointments    Follow-up Information    Dayna N Dunn, PA-C Follow up.   Specialties:  Cardiology, Radiology Why:  Please follow-up with Ronie Spies PA-C on 11/01/16 at 11am. Dayna is one of the PAs that works with Dr. [name]. Contact information: [address] [place] [address] [phone]        Dorrene German, MD Follow up.   Specialty:  Internal Medicine Why:  Your blood sugar looks terribly uncontrolled by recent labwork. Please discuss your blood sugars with your primary care provider. Contact information: [address] [place] [address] [phone]          Discharge Instructions    Amb Referral to Cardiac Rehabilitation    Complete by:  As directed    Referring to [place] Phase 2   Diagnosis:  PTCA   Diet - low sodium heart healthy    Complete by:  As directed    Increase activity slowly    Complete by:  As directed    No driving for 2 days. No lifting over 5 lbs for 1 week. No sexual activity for 1 week. Keep procedure site clean & dry. If you notice increased pain, swelling, bleeding or pus, call/return!  You may shower, but no soaking baths/hot tubs/pools for 1 week.      Discharge Medications     Medication List    TAKE these medications   aspirin EC 81 MG tablet Take 1 tablet (81 mg total) by mouth daily. What changed:  how much to take   carvedilol 6.25 MG tablet Commonly known as:  COREG Take 1 tablet (6.25 mg total) by mouth 2 (two) times daily.   clopidogrel 75 MG tablet Commonly known as:  PLAVIX Take 1 tablet (75 mg total) by mouth daily.    enalapril 5 MG tablet Commonly known as:  VASOTEC TAKE ONE TABLET BY MOUTH TWICE DAILY   isosorbide mononitrate 30 MG 24 hr tablet Commonly known as:  IMDUR Take 1 tablet (30 mg total) by mouth daily.   LANTUS SOLOSTAR 100 UNIT/ML Solostar Pen Generic drug:  Insulin Glargine Inject 45 Units into the skin at bedtime.   nitroGLYCERIN 0.4 MG SL tablet Commonly known as:  NITROSTAT Place 1 tablet (0.4 mg total) under the tongue every 5 (five) minutes as needed for chest pain (up to 3 doses). What changed:  reasons to take this   oxyCODONE 15 MG immediate release tablet Commonly known as:  ROXICODONE Take 1 tablet (15 mg total) by mouth every 4 (four) hours as needed for severe pain.   simvastatin 40 MG tablet Commonly known as:  ZOCOR Take 40 mg by mouth at bedtime.  spironolactone 25 MG tablet Commonly known as:  ALDACTONE Take 0.5 tablets (12.5 mg total) by mouth daily.        Allergies:  Allergies  Allergen Reactions  . Hydrocodone Nausea And Vomiting      Outstanding Labs/Studies   NA  Duration of Discharge Encounter   Greater than 30 minutes including physician time.  Signed, Tacey Ruizayna N Dunn PA-C 10/23/2016, 3:55 PM   I have personally seen and examined this patient. I agree with the assessment and plan as outlined above.   Verne Carrowhristopher Marthe Dant

## 2016-10-23 NOTE — Progress Notes (Signed)
Client watched cardiac cath/PCI video and given angioplasty book

## 2016-10-23 NOTE — Interval H&P Note (Signed)
History and Physical Interval Note:  10/23/2016 10:51 AM  Ryan [name]  has presented today for surgery, with the diagnosis of unstable angina. The various methods of treatment have been discussed with the patient and family. After consideration of risks, benefits and other options for treatment, the patient has consented to  Procedure(s): Left Heart Cath and Coronary Angiography (N/A) as a surgical intervention .  The patient's history has been reviewed, patient examined, no change in status, stable for surgery.  I have reviewed the patient's chart and labs.  Questions were answered to the patient's satisfaction.    Cath Lab Visit (complete for each Cath Lab visit)  Clinical Evaluation Leading to the Procedure:   ACS: No.  Non-ACS:    Anginal Classification: CCS III   Anti-ischemic medical therapy: Maximal Therapy (2 or more classes of medications)  Non-Invasive Test Results: No non-invasive testing performed  Prior CABG: No previous CABG         [name]

## 2016-10-23 NOTE — Discharge Instructions (Signed)

## 2016-10-23 NOTE — Progress Notes (Signed)
Erin PA in and ok to discharge client home

## 2016-10-23 NOTE — H&P (View-Only) (Signed)
Chief Complaint  Patient presents with  . Chest Pain     History of Present Illness: 50 yo WM with history of ongoing tobacco abuse, CAD, HLD, PAD here today for cardiac f/u. He presented to the [facility] ED on 12/12/11 with c/o chest pain. He was found to have anterior ST segment elevation and had cardiac arrest in the [facility] ED. He was transported emergently to [facility] where I met him in the cath lab. Cath showed subtotal occlusion of the mid LAD. This was treated with a 2.75 x 32 mm Promus Element DES. The third OM branch was chronically occluded and the RCA had mild disease. His post MI LVEF was 35%. He did well post MI and was discharged home with a LifeVest. I saw him 01/24/12 for follow up and he had c/o pain in both legs with walking. Non-invasive testing with aortoiliac inflow disease. There also appeared to be left subclavian artery stenosis on non-invasive imaging. I arranged a distal aortogram with bilateral lower ext runoff and left subclavian angiography on 01/31/12. His distal aorta was occluded just distal to the renals. His left subclavian was occluded at the ostium. Follow up echo 02/16/12 with normal LV function. His Lifevest was d/c'ed. He was seen by Dr. Annamarie Major in [facility] clinic and had aorto-bifemoral bypass surgery in February 2014 and left carotid to subclavian artery bypass May 2014. He is still followed in [facility] by Dr. Azzie Almas [facility] December 2015 with no ischemia, prior to hand surgery.    He is here today for follow up. He describes exertional chest pains with dyspnea. His baseline dyspnea is worsened over last few months. No energy. He is smoking 10 cigarettes per day. Right leg pain is increasing. He is seeing [facility] soon for f/u.    Primary Care Physician: Philis Fendt, MD   Past Medical History:  Diagnosis Date  . Anal fissure   . Bruises easily    d/t being on Effient  . Coronary atherosclerosis of native coronary artery    takes Effient but on hold for  surgery  . Diabetes mellitus without complication ([place])    takes Lantus daily  . GERD (gastroesophageal reflux disease)    "takes tums"  . History of blood transfusion    no abnormal reaction noted  . History of kidney stones   . Hypertension    takes Enalapril and Coreg daily   . MI (myocardial infarction)    AMI 6/23 - complicated by VT/Tosades  . Mixed hyperlipidemia    takes Simvastatin daily  . PAD (peripheral artery disease) ([place])   . Rectal polyp     Past Surgical History:  Procedure Laterality Date  . ANAL FISSURECTOMY    . AORTA - BILATERAL FEMORAL ARTERY BYPASS GRAFT N/A 01/16/2013   Procedure: AORTA BIFEMORAL BYPASS GRAFT;  Surgeon: Serafina Mitchell, MD;  Location: [place];  Service: Vascular;  Laterality: N/A;  . APPENDECTOMY    . CAROTID-SUBCLAVIAN BYPASS GRAFT Left 04/03/2013   Procedure: BYPASS GRAFT CAROTID-SUBCLAVIAN;  Surgeon: Serafina Mitchell, MD;  Location: [place];  Service: Vascular;  Laterality: Left;  . CIRCUMCISION N/A 12/15/2015   Procedure: CIRCUMCISION ADULT;  Surgeon: Cleon Gustin, MD;  Location: [facility];  Service: Urology;  Laterality: N/A;  . COLONOSCOPY    . CORONARY ANGIOPLASTY     stent placed Dec 12, 2011  . DIAGNOSTIC LAPAROSCOPY    . I&D EXTREMITY Left 04/18/2013   Procedure: debridement of left neck  lymphocele;  Surgeon: Serafina Mitchell, MD;  Location: [place];  Service: Vascular;  Laterality: Left;  I and D of left neck  . I&D EXTREMITY Right 11/21/2014   Procedure: IRRIGATION AND DEBRIDEMENT RIGHT HAND;  Surgeon: Roseanne Kaufman, MD;  Location: [place];  Service: Orthopedics;  Laterality: Right;  . LEFT HEART CATHETERIZATION WITH CORONARY ANGIOGRAM N/A 12/12/2011   Procedure: LEFT HEART CATHETERIZATION WITH CORONARY ANGIOGRAM;  Surgeon: Burnell Blanks, MD;  Location: [facility] CATH LAB;  Service: Cardiovascular;  Laterality: N/A;  . LOWER EXTREMITY ANGIOGRAM N/A 01/31/2012   Procedure: LOWER EXTREMITY ANGIOGRAM;  Surgeon: Burnell Blanks, MD;   Location: [facility] CATH LAB;  Service: Cardiovascular;  Laterality: N/A;  . PERCUTANEOUS CORONARY STENT INTERVENTION (PCI-S) Right 12/12/2011   Procedure: PERCUTANEOUS CORONARY STENT INTERVENTION (PCI-S);  Surgeon: Burnell Blanks, MD;  Location: [facility] CATH LAB;  Service: Cardiovascular;  Laterality: Right;  . REPAIR EXTENSOR TENDON Right 11/21/2014   Procedure: WITH REPAIR/RECONSTRUCTION OF EXTENSOR TENDONS AS NEEDED;  Surgeon: Roseanne Kaufman, MD;  Location: [place];  Service: Orthopedics;  Laterality: Right;    Current Outpatient Prescriptions  Medication Sig Dispense Refill  . aspirin EC 81 MG tablet Take 162 mg by mouth daily.    . carvedilol (COREG) 25 MG tablet Take 25 mg by mouth 2 (two) times daily with a meal.    . clopidogrel (PLAVIX) 75 MG tablet Take 1 tablet (75 mg total) by mouth daily. 90 tablet 3  . enalapril (VASOTEC) 5 MG tablet TAKE ONE TABLET BY MOUTH TWICE DAILY 60 tablet 2  . Insulin Glargine (LANTUS SOLOSTAR) 100 UNIT/ML Solostar Pen Inject 45 Units into the skin at bedtime.     . isosorbide mononitrate (IMDUR) 30 MG 24 hr tablet Take 1 tablet (30 mg total) by mouth daily. 90 tablet 3  . nitroGLYCERIN (NITROSTAT) 0.4 MG SL tablet Place 1 tablet (0.4 mg total) under the tongue every 5 (five) minutes as needed for chest pain. 25 tablet 6  . oxyCODONE (ROXICODONE) 15 MG immediate release tablet Take 1 tablet (15 mg total) by mouth every 4 (four) hours as needed for severe pain. 30 tablet 0  . simvastatin (ZOCOR) 40 MG tablet Take 40 mg by mouth at bedtime.    Marland Kitchen spironolactone (ALDACTONE) 25 MG tablet Take 0.5 tablets (12.5 mg total) by mouth daily. 15 tablet 11  . carvedilol (COREG) 3.125 MG tablet TAKE ONE TABLET BY MOUTH TWICE A DAY WITH MEALS 60 tablet 2   No current facility-administered medications for this visit.     Allergies  Allergen Reactions  . Hydrocodone Nausea And Vomiting    Social History   Social History  . Marital status: Divorced    Spouse name: N/A    . Number of children: N/A  . Years of education: N/A   Occupational History  .  Other   Social History Main Topics  . Smoking status: Current Every Day Smoker    Packs/day: 0.25    Years: 25.00    Types: Cigarettes  . Smokeless tobacco: Never Used  . Alcohol use No  . Drug use: No  . Sexual activity: Yes   Other Topics Concern  . Not on file   Social History Narrative   [employer] - physical labor  - works night shift          Family History  Problem Relation Age of Onset  . Cancer Father     Lung  . Cancer Maternal Uncle  Colon  . Diabetes Mother   . COPD Mother     Review of Systems:  As stated in the HPI and otherwise negative.   BP 100/80   Pulse 76   Ht _0  (1.753 m)   Wt 211 lb (95.7 kg)   BMI 31.16 kg/m   Physical Examination: General: Well developed, well nourished, NAD  HEENT: OP clear, mucus membranes moist  SKIN: warm, dry. No rashes. Neuro: No focal deficits  Musculoskeletal: Muscle strength 5/5 all ext  Psychiatric: Mood and affect normal  Neck: No JVD, no carotid bruits, no thyromegaly, no lymphadenopathy.  Lungs:Clear bilaterally, no wheezes, rhonci, crackles Cardiovascular: Regular rate and rhythm. No murmurs, gallops or rubs. Abdomen:Soft. Bowel sounds present. Non-tender.  Extremities: No lower extremity edema. Pulses non-palpable right PT/DP and trace in the DP/PT.  EKG:  EKG is  ordered today. The ekg ordered today demonstrates NSR, rate 76 bpm. T wave abnormality, unchanged.   Recent Labs: 12/14/2015: BUN 15; Creatinine, Ser 0.94; Hemoglobin 17.2; Platelets 183; Potassium 4.2; Sodium 135   Lipid Panel Followed in primary care   Wt Readings from Last 3 Encounters:  10/11/16 211 lb (95.7 kg)  07/07/16 206 lb (93.4 kg)  12/15/15 213 lb (96.6 kg)     Other studies Reviewed: Additional studies/ records that were reviewed today include: . Review of the above records demonstrates:    Assessment and Plan:    1. PAD: He has had recent change in symptoms in right leg. Follow up soon in [facility]. He is s/p  Aortobifemoral bypass and left carotid to subclavian artery bypass. He is followed in [facility].   2. CAD with unstable angina: He has chest pain c/w unstable angina. He is s/p anterior STEMI January 2013 with occluded LAD treated with DES. Chronically occluded OM3. He has been on dual antiplatelet therapy with ASA and Plavix. Continue statin and beta blocker. Plan cath at [facility] 10/23/16 at 10am. Risks and benefits reviewed with pt. Pre-cath labs today.   3. Tobacco abuse: Complete cessation encouraged. He is trying to stop. I have spent ten minutes on counseling to stop. He failed Chantix.   Current medicines are reviewed at length with the patient today.  The patient does not have concerns regarding medicines.  The following changes have been made:  no change  Labs/ tests ordered today include:   Orders Placed This Encounter  Procedures  . CBC  . Basic Metabolic Panel (BMET)  . INR/PT  . EKG 12-Lead    Disposition:   FU with me in 12  months  Signed, [name], MD 10/11/2016 9:40 AM    [facility] [place], [place], [place]  [address] Phone: [phone]; Fax: [phone]

## 2016-10-24 ENCOUNTER — Encounter (HOSPITAL_COMMUNITY): Payer: Self-pay | Admitting: Cardiovascular Disease

## 2016-10-24 NOTE — Progress Notes (Signed)
Same Day PCI D/C Phone Call   []   1st call         [x]   Answer.         []   No Answer    []   2nd call        []   Answer       []   No Answer   Is there bleeding or other abnormality from access site?  []   Yes    [x]   No  Has there been any chest pain, nausea or vomiting?    [x]   No  []   Yes chest pain   []   Yes nausea or vomiting  Have you sought any medical attention since discharge?    []   Yes  [x]   No  Remind Patient about their follow up appointment, review discharge & medication instructions.   [x]   Yes  []   No  Are you continuing to take you daily ASA & antiplatlet inhibitor(Plavix, Effient, Brilinitia)?  [x]   Yes  []   No  Would you say you were satisfied with you Same Day Discharge?  (Disagree, Neutral, Agree)   []   Disagree, why  []   Neutral  [x]   Agree

## 2016-10-25 ENCOUNTER — Encounter: Payer: Self-pay | Admitting: Family

## 2016-10-25 MED ORDER — ISOSORBIDE MONONITRATE ER 30 MG PO TB24: 30.0000 mg | ORAL_TABLET | Freq: Every day | ORAL | 3 refills | Status: DC

## 2016-10-25 MED ORDER — CLOPIDOGREL BISULFATE 75 MG PO TABS: 75.0000 mg | ORAL_TABLET | Freq: Every day | ORAL | 3 refills | Status: DC

## 2016-10-25 NOTE — Addendum Note (Signed)
Addended by: Demetrios Loll on: 10/25/2016 09:11 AM   Modules accepted: Orders

## 2016-10-30 ENCOUNTER — Inpatient Hospital Stay (HOSPITAL_COMMUNITY): Admission: RE | Admit: 2016-10-30 | Payer: Medicaid Other | Source: Ambulatory Visit

## 2016-10-30 ENCOUNTER — Encounter (HOSPITAL_COMMUNITY): Payer: Medicaid Other

## 2016-10-30 ENCOUNTER — Ambulatory Visit: Payer: Medicaid Other | Admitting: Family

## 2016-10-31 NOTE — Progress Notes (Deleted)
Cardiology Office Note    Date:  10/31/2016  ID:  Ryan [name], DOB [birth year], MRN [id number] PCP:  Dorrene German, MD  Cardiologist:  Dr. [name]   Chief Complaint: f/u cath  History of Present Illness:  Ryan [name] is a 50 y.o. male with history of tobacco abuse, CAD (ant STEMI with cardiac arrest 2013 s/p DES to mLAD, recent PTCA to mLAD 10/23/16), HTN, HLD, PAD (followed by Vascular - aortobifem bypass 01/2013 & left carotid-subclavian artery bypass 04/2013) who presents for post-cath follow-up.  To recap prior history, he presented to the [facility] ED on 12/12/11 with anterior ST segment elevation and had cardiac arrest in the [facility] ED. He was transported emergently to [facility] where showed subtotal occlusion of the mid LAD. This was treated with a 2.75 x 32 mm Promus Element DES. The third OM branch was chronically occluded and the RCA had mild disease. His post MI LVEF was 35%. He did well post MI and was discharged home with a LifeVest. Follow up echo 02/16/12 with normal LV function so LifeVest was discontinued.   He recently presented back to the office 10/2016 complaining of exertional chest pain with dyspnea and fatigue. He was also reporting increasing right leg pain and has follow-up with VVS soon. He was brought in for cardiac cath with 80% stenosis in mLAD, known total occ of OM3 with L-L collaterals, mild mRCA stenosis, EF 55-60% -> tx with Angiosculpt cutting balloon to the mid-LAD. Lifelong DAPT recommended. Labs 10/2016 with BMET OK except glucose 384, CBC OK, no recent lipids or LFTs available.   statin? cath site tob    Past Medical History:  Diagnosis Date  . Anal fissure   . Bruises easily    d/t being on Effient  . Coronary atherosclerosis of native coronary artery    a. ant STEMI with cardiac arrest 2013 s/p DES to mLAD. b. [place] 10/2016 s/p PTCA to mLAD.  . Diabetes mellitus without complication (HCC)   . GERD (gastroesophageal reflux disease)    "takes  tums"  . History of blood transfusion    no abnormal reaction noted  . History of kidney stones   . Hypertension   . Ischemic cardiomyopathy    a. EF 35% in 02/2012 at time of acute MI, improved to normal on subsequent imaging.  . MI (myocardial infarction)    AMI 1/13 - complicated by VT/Tosades  . Mixed hyperlipidemia   . PAD (peripheral artery disease) (HCC)    followed by Vascular - aortobifem bypass 01/2013 & left carotid-subclavian artery bypass 04/2013  . Rectal polyp     Past Surgical History:  Procedure Laterality Date  . ANAL FISSURECTOMY    . AORTA - BILATERAL FEMORAL ARTERY BYPASS GRAFT N/A 01/16/2013   Procedure: AORTA BIFEMORAL BYPASS GRAFT;  Surgeon: Nada Libman, MD;  Location: [facility] OR;  Service: Vascular;  Laterality: N/A;  . APPENDECTOMY    . CARDIAC CATHETERIZATION N/A 10/23/2016   Procedure: Left Heart Cath and Coronary Angiography;  Surgeon: Kathleene Hazel, MD;  Location: [facility] INVASIVE CV LAB;  Service: Cardiovascular;  Laterality: N/A;  . CARDIAC CATHETERIZATION N/A 10/23/2016   Procedure: Coronary Balloon Angioplasty;  Surgeon: Kathleene Hazel, MD;  Location: [facility] INVASIVE CV LAB;  Service: Cardiovascular;  Laterality: N/A;  . CAROTID-SUBCLAVIAN BYPASS GRAFT Left 04/03/2013   Procedure: BYPASS GRAFT CAROTID-SUBCLAVIAN;  Surgeon: Nada Libman, MD;  Location: [facility] OR;  Service: Vascular;  Laterality: Left;  . CIRCUMCISION N/A 12/15/2015  Procedure: CIRCUMCISION ADULT;  Surgeon: Malen GauzePatrick L McKenzie, MD;  Location: [facility] ORS;  Service: Urology;  Laterality: N/A;  . COLONOSCOPY    . CORONARY ANGIOPLASTY     stent placed Dec 12, 2011  . DIAGNOSTIC LAPAROSCOPY    . I&D EXTREMITY Left 04/18/2013   Procedure: debridement of left neck lymphocele;  Surgeon: Nada LibmanVance W Brabham, MD;  [facility];  Service: Vascular;  Laterality: Left;  I and D of left neck  . I&D EXTREMITY Right 11/21/2014   Procedure: IRRIGATION AND DEBRIDEMENT RIGHT HAND;  Surgeon: Dominica SeverinWilliam Gramig,  MD;  [facility];  Service: Orthopedics;  Laterality: Right;  . LEFT HEART CATHETERIZATION WITH CORONARY ANGIOGRAM N/A 12/12/2011   Procedure: LEFT HEART CATHETERIZATION WITH CORONARY ANGIOGRAM;  Surgeon: Kathleene Hazelhristopher D McAlhany, MD;  [facility];  Service: Cardiovascular;  Laterality: N/A;  . LOWER EXTREMITY ANGIOGRAM N/A 01/31/2012   Procedure: LOWER EXTREMITY ANGIOGRAM;  Surgeon: Kathleene Hazelhristopher D McAlhany, MD;  [facility];  Service: Cardiovascular;  Laterality: N/A;  . PERCUTANEOUS CORONARY STENT INTERVENTION (PCI-S) Right 12/12/2011   Procedure: PERCUTANEOUS CORONARY STENT INTERVENTION (PCI-S);  Surgeon: Kathleene Hazelhristopher D McAlhany, MD;  [facility];  Service: Cardiovascular;  Laterality: Right;  . REPAIR EXTENSOR TENDON Right 11/21/2014   Procedure: WITH REPAIR/RECONSTRUCTION OF EXTENSOR TENDONS AS NEEDED;  Surgeon: Dominica SeverinWilliam Gramig, MD;  Location: [facility] OR;  Service: Orthopedics;  Laterality: Right;    Current Medications: Current Outpatient Prescriptions  Medication Sig Dispense Refill  . aspirin EC 81 MG tablet Take 1 tablet (81 mg total) by mouth daily. 90 tablet 3  . carvedilol (COREG) 6.25 MG tablet Take 1 tablet (6.25 mg total) by mouth 2 (two) times daily. 180 tablet 3  . clopidogrel (PLAVIX) 75 MG tablet Take 1 tablet (75 mg total) by mouth daily. 90 tablet 3  . enalapril (VASOTEC) 5 MG tablet TAKE ONE TABLET BY MOUTH TWICE DAILY 60 tablet 2  . Insulin Glargine (LANTUS SOLOSTAR) 100 UNIT/ML Solostar Pen Inject 45 Units into the skin at bedtime.     . isosorbide mononitrate (IMDUR) 30 MG 24 hr tablet Take 1 tablet (30 mg total) by mouth daily. 90 tablet 3  . nitroGLYCERIN (NITROSTAT) 0.4 MG SL tablet Place 1 tablet (0.4 mg total) under the tongue every 5 (five) minutes as needed for chest pain (up to 3 doses). 25 tablet 3  . oxyCODONE (ROXICODONE) 15 MG immediate release tablet Take 1 tablet (15 mg total) by mouth every 4 (four) hours as needed for severe pain. 30  tablet 0  . simvastatin (ZOCOR) 40 MG tablet Take 40 mg by mouth at bedtime.    Marland Kitchen. spironolactone (ALDACTONE) 25 MG tablet Take 0.5 tablets (12.5 mg total) by mouth daily. 45 tablet 3   No current facility-administered medications for this visit.      Allergies:   Hydrocodone   Social History   Social History  . Marital status: Divorced    Spouse name: N/A  . Number of children: N/A  . Years of education: N/A   Occupational History  .  Other   Social History Main Topics  . Smoking status: Current Every Day Smoker    Packs/day: 0.25    Years: 25.00    Types: Cigarettes  . Smokeless tobacco: Never Used  . Alcohol use No  . Drug use: No  . Sexual activity: Yes   Other Topics Concern  . Not on file   Social History Narrative   Disabled - no  longer works        Family History:  The patient's family history includes COPD in his mother; Cancer in his father and maternal uncle; Diabetes in his mother. ***  ROS:   Please see the history of present illness. Otherwise, review of systems is positive for ***.  All other systems are reviewed and otherwise negative.    PHYSICAL EXAM:   VS:  There were no vitals taken for this visit.  BMI: There is no height or weight on file to calculate BMI. GEN: Well nourished, well developed, in no acute distress  HEENT: normocephalic, atraumatic Neck: no JVD, carotid bruits, or masses Cardiac: ***RRR; no murmurs, rubs, or gallops, no edema  Respiratory:  clear to auscultation bilaterally, normal work of breathing GI: soft, nontender, nondistended, + BS MS: no deformity or atrophy  Skin: warm and dry, no rash Neuro:  Alert and Oriented x 3, Strength and sensation are intact, follows commands Psych: euthymic mood, full affect  Wt Readings from Last 3 Encounters:  10/23/16 216 lb (98 kg)  10/11/16 211 lb (95.7 kg)  07/07/16 206 lb (93.4 kg)      Studies/Labs Reviewed:   EKG:  EKG was ordered today and personally reviewed by me and  demonstrates *** EKG was not ordered today.***  Recent Labs: 10/11/2016: BUN 14; Creat 0.98; Hemoglobin 16.1; Platelets 224; Potassium 4.1; Sodium 135   Lipid Panel    Component Value Date/Time   CHOL 237 (H) 12/13/2011 0607   TRIG 329 (H) 12/13/2011 0607   HDL 30 (L) 12/13/2011 0607   CHOLHDL 7.9 12/13/2011 0607   VLDL 66 (H) 12/13/2011 0607   LDLCALC 141 (H) 12/13/2011 2863    Additional studies/ records that were reviewed today include: Summarized above.***    ASSESSMENT & PLAN:   1. CAD s/p PCI as above 2. Essential HTN 3. Hyperlipidemia 4. Diabetes mellitus uncontrolled 5. Tobacco abuse  Disposition: F/u with ***   Medication Adjustments/Labs and Tests Ordered: Current medicines are reviewed at length with the patient today.  Concerns regarding medicines are outlined above. Medication changes, Labs and Tests ordered today are summarized above and listed in the Patient Instructions accessible in Encounters.   Thomasene Mohair PA-C  10/31/2016 10:27 AM    [facility] [address] [place], [place], [place]  [address] Phone: [phone]; Fax: [phone]

## 2016-11-01 ENCOUNTER — Encounter: Payer: Medicaid Other | Admitting: Physician Assistant

## 2016-11-06 ENCOUNTER — Encounter: Payer: Self-pay | Admitting: Physician Assistant

## 2016-11-09 ENCOUNTER — Ambulatory Visit: Payer: Medicaid Other | Admitting: Physician Assistant

## 2016-11-13 ENCOUNTER — Ambulatory Visit: Payer: Medicaid Other | Admitting: Physician Assistant

## 2016-11-14 ENCOUNTER — Encounter: Payer: Self-pay | Admitting: Physician Assistant

## 2016-11-16 ENCOUNTER — Encounter: Payer: Self-pay | Admitting: Family

## 2016-11-24 ENCOUNTER — Encounter (HOSPITAL_COMMUNITY): Payer: Medicaid Other

## 2016-11-24 ENCOUNTER — Other Ambulatory Visit (HOSPITAL_COMMUNITY): Payer: Medicaid Other

## 2016-11-24 ENCOUNTER — Ambulatory Visit: Payer: Medicaid Other | Admitting: Family

## 2017-05-24 ENCOUNTER — Encounter: Payer: Self-pay | Admitting: Family

## 2017-06-01 ENCOUNTER — Other Ambulatory Visit: Payer: Self-pay

## 2017-06-01 DIAGNOSIS — I70209 Unspecified atherosclerosis of native arteries of extremities, unspecified extremity: Secondary | ICD-10-CM

## 2017-06-04 ENCOUNTER — Ambulatory Visit (HOSPITAL_COMMUNITY)
Admission: RE | Admit: 2017-06-04 | Discharge: 2017-06-04 | Disposition: A | Discharge status: Home / Self Care | Payer: Medicaid Other | Source: Ambulatory Visit | Attending: Surgery | Admitting: Surgery

## 2017-06-04 ENCOUNTER — Encounter: Payer: Self-pay | Admitting: Family

## 2017-06-04 ENCOUNTER — Ambulatory Visit (HOSPITAL_COMMUNITY)
Admission: RE | Admit: 2017-06-04 | Discharge: 2017-06-04 | Disposition: A | Discharge status: Home / Self Care | Payer: Medicaid Other | Source: Ambulatory Visit | Attending: Family | Admitting: Family

## 2017-06-04 ENCOUNTER — Ambulatory Visit (INDEPENDENT_AMBULATORY_CARE_PROVIDER_SITE_OTHER): Payer: Medicaid Other | Admitting: Family

## 2017-06-04 VITALS — BP 109/70 | HR 62 | Temp 98.4°F | Resp 16 | Ht 69.0 in | Wt 202.0 lb

## 2017-06-04 DIAGNOSIS — E1151 Type 2 diabetes mellitus with diabetic peripheral angiopathy without gangrene: Secondary | ICD-10-CM | POA: Diagnosis not present

## 2017-06-04 DIAGNOSIS — Z48812 Encounter for surgical aftercare following surgery on the circulatory system: Secondary | ICD-10-CM | POA: Insufficient documentation

## 2017-06-04 DIAGNOSIS — E1165 Type 2 diabetes mellitus with hyperglycemia: Secondary | ICD-10-CM

## 2017-06-04 DIAGNOSIS — F17200 Nicotine dependence, unspecified, uncomplicated: Secondary | ICD-10-CM | POA: Diagnosis not present

## 2017-06-04 DIAGNOSIS — I7409 Other arterial embolism and thrombosis of abdominal aorta: Secondary | ICD-10-CM

## 2017-06-04 DIAGNOSIS — I70209 Unspecified atherosclerosis of native arteries of extremities, unspecified extremity: Secondary | ICD-10-CM | POA: Diagnosis present

## 2017-06-04 DIAGNOSIS — I771 Stricture of artery: Secondary | ICD-10-CM

## 2017-06-04 DIAGNOSIS — I70203 Unspecified atherosclerosis of native arteries of extremities, bilateral legs: Secondary | ICD-10-CM | POA: Insufficient documentation

## 2017-06-04 DIAGNOSIS — Z95828 Presence of other vascular implants and grafts: Secondary | ICD-10-CM | POA: Diagnosis not present

## 2017-06-04 DIAGNOSIS — I708 Atherosclerosis of other arteries: Secondary | ICD-10-CM | POA: Insufficient documentation

## 2017-06-04 DIAGNOSIS — IMO0002 Reserved for concepts with insufficient information to code with codable children: Secondary | ICD-10-CM

## 2017-06-04 LAB — VAS US LOWER EXTREMITY BYPASS GRAFT DUPLEX
RIGHT POST TIB DIST SYS: 16 cm/s
Right super femoral dist sys PSV: -40 cm/s
Right super femoral mid sys PSV: -32 cm/s
Right super femoral prox sys PSV: 62 cm/s

## 2017-06-04 NOTE — Patient Instructions (Signed)
Steps to Quit Smoking Smoking tobacco can be bad for your health. It can also affect almost every organ in your body. Smoking puts you and people around you at risk for many serious long-lasting (chronic) diseases. Quitting smoking is hard, but it is one of the best things that you can do for your health. It is never too late to quit. What are the benefits of quitting smoking? When you quit smoking, you lower your risk for getting serious diseases and conditions. They can include:  Lung cancer or lung disease.  Heart disease.  Stroke.  Heart attack.  Not being able to have children (infertility).  Weak bones (osteoporosis) and broken bones (fractures).  If you have coughing, wheezing, and shortness of breath, those symptoms may get better when you quit. You may also get sick less often. If you are pregnant, quitting smoking can help to lower your chances of having a baby of low birth weight. What can I do to help me quit smoking? Talk with your doctor about what can help you quit smoking. Some things you can do (strategies) include:  Quitting smoking totally, instead of slowly cutting back how much you smoke over a period of time.  Going to in-person counseling. You are more likely to quit if you go to many counseling sessions.  Using resources and support systems, such as: ? Online chats with a counselor. ? Phone quitlines. ? Printed self-help materials. ? Support groups or group counseling. ? Text messaging programs. ? Mobile phone apps or applications.  Taking medicines. Some of these medicines may have nicotine in them. If you are pregnant or breastfeeding, do not take any medicines to quit smoking unless your doctor says it is okay. Talk with your doctor about counseling or other things that can help you.  Talk with your doctor about using more than one strategy at the same time, such as taking medicines while you are also going to in-person counseling. This can help make  quitting easier. What things can I do to make it easier to quit? Quitting smoking might feel very hard at first, but there is a lot that you can do to make it easier. Take these steps:  Talk to your family and friends. Ask them to support and encourage you.  Call phone quitlines, reach out to support groups, or work with a counselor.  Ask people who smoke to not smoke around you.  Avoid places that make you want (trigger) to smoke, such as: ? Bars. ? Parties. ? Smoke-break areas at work.  Spend time with people who do not smoke.  Lower the stress in your life. Stress can make you want to smoke. Try these things to help your stress: ? Getting regular exercise. ? Deep-breathing exercises. ? Yoga. ? Meditating. ? Doing a body scan. To do this, close your eyes, focus on one area of your body at a time from head to toe, and notice which parts of your body are tense. Try to relax the muscles in those areas.  Download or buy apps on your mobile phone or tablet that can help you stick to your quit plan. There are many free apps, such as QuitGuide from the CDC (Centers for Disease Control and Prevention). You can find more support from smokefree.gov and other websites.  This information is not intended to replace advice given to you by your health care provider. Make sure you discuss any questions you have with your health care provider. Document Released: 09/16/2009 Document   Revised: 07/18/2016 Document Reviewed: 04/06/2015 Elsevier Interactive Patient Education  2018 Elsevier Inc.     Peripheral Vascular Disease Peripheral vascular disease (PVD) is a disease of the blood vessels that are not part of your heart and brain. A simple term for PVD is poor circulation. In most cases, PVD narrows the blood vessels that carry blood from your heart to the rest of your body. This can result in a decreased supply of blood to your arms, legs, and internal organs, like your stomach or kidneys.  However, it most often affects a person's lower legs and feet. There are two types of PVD.  Organic PVD. This is the more common type. It is caused by damage to the structure of blood vessels.  Functional PVD. This is caused by conditions that make blood vessels contract and tighten (spasm).  Without treatment, PVD tends to get worse over time. PVD can also lead to acute ischemic limb. This is when an arm or limb suddenly has trouble getting enough blood. This is a medical emergency. Follow these instructions at home:  Take medicines only as told by your doctor.  Do not use any tobacco products, including cigarettes, chewing tobacco, or electronic cigarettes. If you need help quitting, ask your doctor.  Lose weight if you are overweight, and maintain a healthy weight as told by your doctor.  Eat a diet that is low in fat and cholesterol. If you need help, ask your doctor.  Exercise regularly. Ask your doctor for some good activities for you.  Take good care of your feet. ? Wear comfortable shoes that fit well. ? Check your feet often for any cuts or sores. Contact a doctor if:  You have cramps in your legs while walking.  You have leg pain when you are at rest.  You have coldness in a leg or foot.  Your skin changes.  You are unable to get or have an erection (erectile dysfunction).  You have cuts or sores on your feet that are not healing. Get help right away if:  Your arm or leg turns cold and blue.  Your arms or legs become red, warm, swollen, painful, or numb.  You have chest pain or trouble breathing.  You suddenly have weakness in your face, arm, or leg.  You become very confused or you cannot speak.  You suddenly have a very bad headache.  You suddenly cannot see. This information is not intended to replace advice given to you by your health care provider. Make sure you discuss any questions you have with your health care provider. Document Released:  02/14/2010 Document Revised: 04/27/2016 Document Reviewed: 04/30/2014 Elsevier Interactive Patient Education  2017 Elsevier Inc.  

## 2017-06-04 NOTE — Progress Notes (Signed)
[facility] [place]   CC: Follow up peripheral artery occlusive disease  History of Present Illness Ryan [name] is a 50 y.o. male patient of whom Dr. Brabham initially evaluated in March of 2013. Pt has missed multiple appointments and was last evaluated by Dr. Brabham on 08/11/13.  At that time by physical exam the patient's left carotid subclavian bypass graft was widely patent. In addition he had faintly palpable pedal pulses. Dr. Brabham discussed with the patient that he did not feel that his complaints that day in his right arm as well as his left leg were related to blood flow issues. They appeared to be musculoskeletal. Dr. Brabham planned on seeing him back in 6 months for followup carotid ultrasound to evaluate his carotid subclavian bypass graft, as well as ABIs. Dr. Brabham encouraged him to meet with his primary care physician to discuss his complaints in the right arm and left leg.  I last evaluated him in August 2017 and advised him to return in 3 months.   He suffered a MI in January 2013, and at that time he had a drug-eluting stent placed to his LAD. During this process, an abdominal aortogram revealed aortic occlusion at the level of the renal arteries. He also has a left subclavian artery occlusion. Because of his drug-eluting stent, Dr. Brabham could not proceed with surgical revascularization. He underwent aortobifemoral bypass grafting on 01/16/2013. He recovered nicely from this and on 04/03/2013 he underwent left carotid subclavian bypass graft. He had to go back to the operating room on 04/18/2013 for drainage of a lymphocele. He is now recovered from his operations.  He had a coronary angioplasty in November, 2017, found on routine exam by his cardiologist, sx was dyspnea, no chest pain.   His right calf and foot hurts after walking about 80 feet, resolves after several minutes of rest, he feels this has worsened. No problems with the left leg. He  denies non healing wounds.   Pt Diabetic: Yes, states his last A1C was 13, previous was 11, severely uncontrolled DM.  Pt smoker: smoker  (1/2 ppd, started at age 16)  Pt meds include: Statin :Yes Betablocker: Yes ASA: Yes Other anticoagulants/antiplatelets: Plavix    Past Medical History:  Diagnosis Date  . Anal fissure   . Bruises easily    d/t being on Effient  . Coronary atherosclerosis of native coronary artery    a. ant STEMI with cardiac arrest 2013 s/p DES to mLAD. b. [place] 10/2016 s/p PTCA to mLAD.  . Diabetes mellitus without complication (HCC)   . GERD (gastroesophageal reflux disease)    "takes tums"  . History of blood transfusion    no abnormal reaction noted  . History of kidney stones   . Hypertension   . Ischemic cardiomyopathy    a. EF 35% in 02/2012 at time of acute MI, improved to normal on subsequent imaging.  . MI (myocardial infarction) (HCC)    AMI 1/13 - complicated by VT/Tosades  . Mixed hyperlipidemia   . PAD (peripheral artery disease) (HCC)    followed by Vascular - aortobifem bypass 01/2013 & left carotid-subclavian artery bypass 04/2013  . Rectal polyp     Social History Social History  Substance Use Topics  . Smoking status: Current Every Day Smoker    Packs/day: 0.25    Years: 25.00    Types: Cigarettes  . Smokeless tobacco: Never Used  . Alcohol use No    Family History Family History    Problem Relation Age of Onset  . Cancer Father        Lung  . Diabetes Mother   . COPD Mother   . Cancer Maternal Uncle        Colon    Past Surgical History:  Procedure Laterality Date  . ANAL FISSURECTOMY    . AORTA - BILATERAL FEMORAL ARTERY BYPASS GRAFT N/A 01/16/2013   Procedure: AORTA BIFEMORAL BYPASS GRAFT;  Surgeon: Vance W Brabham, MD;  Location: [facility];  Service: Vascular;  Laterality: N/A;  . APPENDECTOMY    . CARDIAC CATHETERIZATION N/A 10/23/2016   Procedure: Left Heart Cath and Coronary Angiography;  Surgeon: Christopher D  McAlhany, MD;  Location: [facility] INVASIVE CV LAB;  Service: Cardiovascular;  Laterality: N/A;  . CARDIAC CATHETERIZATION N/A 10/23/2016   Procedure: Coronary Balloon Angioplasty;  Surgeon: Christopher D McAlhany, MD;  Location: [facility] INVAS[facility] CV LAB;  Service: Cardiovascular;  Laterality: N/A;  . CAROTID-SUBCLAVIAN BYPASS GRAFT Left 04/03/2013   Procedure: BYPASS GRAFT CAROTID-SUBCLAVIAN;  Surgeon: Vance W Brabham, MD;  Location: [facility] OR;  Service: Vascular;  Laterality: Left;  . CIRCUMCISION N/A 12/15/2015   Procedure: CIRCUMCISION ADULT;  Surgeon: Patrick L McKenzie, MD;  Location: [facility] ORS;  Service: Urology;  Laterality: N/A;  . COLONOSCOPY    . CORONARY ANGIOPLASTY     stent placed Dec 12, 2011  . DIAGNOSTIC LAPAROSCOPY    . I&D EXTREMITY Left 04/18/2013   Procedure: debridement of left neck lymphocele;  Surgeon: Vance W Brabham, MD;  Location: [facility];  Service: Vascular;  Laterality: Left;  I and D of left neck  . I&D EXTREMITY Right 11/21/2014   Procedure: IRRIGATION AND DEBRIDEMENT RIGHT HAND;  Surgeon: William Gramig, MD;  Location: [facility];  Service: Orthopedics;  Laterality: Right;  . LEFT HEART CATHETERIZATION WITH CORONARY ANGIOGRAM N/A 12/12/2011   Procedure: LEFT HEART CATHETERIZATION WITH CORONARY ANGIOGRAM;  Surgeon: Christopher D McAlhany, MD;  [facility] CATH [facility];  Service: Cardiovascular;  Laterality: N/A;  . LOWER EXTREMITY ANGIOGRAM N/A 01/31/2012   Procedure: LOWER EXTREMITY ANGIOGRAM;  Surgeon: Christopher D McAlhany, MD;  [facility];  Service: Cardiovascular;  Laterality: N/A;  . PERCUTANEOUS CORONARY STENT INTERVENTION (PCI-S) Right 12/12/2011   Procedure: PERCUTANEOUS CORONARY STENT INTERVENTION (PCI-S);  Surgeon: Christopher D McAlhany, MD;  [facility];  Service: Cardiovascular;  Laterality: Right;  . REPAIR EXTENSOR TENDON Right 11/21/2014   Procedure: WITH REPAIR/RECONSTRUCTION OF EXTENSOR TENDONS AS NEEDED;  Surgeon: William Gramig, MD;  [facility]: [facility];  Service:  Orthopedics;  Laterality: Right;    Allergies  Allergen Reactions  . Hydrocodone Nausea And Vomiting and Hives    Current Outpatient Prescriptions  Medication Sig Dispense Refill  . aspirin EC 81 MG tablet Take 1 tablet (81 mg total) by mouth daily. 90 tablet 3  . carvedilol (COREG) 6.25 MG tablet Take 1 tablet (6.25 mg total) by mouth 2 (two) times daily. 180 tablet 3  . clopidogrel (PLAVIX) 75 MG tablet Take 1 tablet (75 mg total) by mouth daily. 90 tablet 3  . enalapril (VASOTEC) 5 MG tablet TAKE ONE TABLET BY MOUTH TWICE DAILY 60 tablet 2  . Insulin Glargine (LANTUS SOLOSTAR) 100 UNIT/ML Solostar Pen Inject 45 Units into the skin at bedtime.     . isosorbide mononitrate (IMDUR) 30 MG 24 hr tablet Take 1 tablet (30 mg total) by mouth daily. 90 tablet 3  . nitroGLYCERIN (NITROSTAT) 0.4 MG SL tablet Place 1 tablet (0.4 mg total) under the tongue   every 5 (five) minutes as needed for chest pain (up to 3 doses). 25 tablet 3  . simvastatin (ZOCOR) 40 MG tablet Take 40 mg by mouth at bedtime.    . spironolactone (ALDACTONE) 25 MG tablet Take 0.5 tablets (12.5 mg total) by mouth daily. 45 tablet 3  . oxyCODONE (ROXICODONE) 15 MG immediate release tablet Take 1 tablet (15 mg total) by mouth every 4 (four) hours as needed for severe pain. (Patient not taking: Reported on 06/04/2017) 30 tablet 0   No current facility-administered medications for this visit.     ROS: See HPI for pertinent positives and negatives.   Physical Examination  Vitals:   06/04/17 1008  BP: 109/70  Pulse: 62  Resp: 16  Temp: 98.4 F (36.9 C)  SpO2: 98%  Weight: 202 lb (91.6 kg)  Height: 5' 9" (1.753 m)   Body mass index is 29.83 kg/m.  General: WDWN in NAD Gait: slight limp HENT: WNL Eyes: Pupils equal Pulmonary: normal non-labored breathing, limited air movement in all fields, no rales, rhonchi, or wheezes + occasional dry cough.  Cardiac: RRR, no murmur detected  Abdomen: soft, NT, + moderate sized  reducible ventral hernia.  Skin: no rashes, no ulcers;  no cellulitis.   VASCULAR EXAM         Carotid Bruits Right Left   Negative positive       Radial pulses are 2+ palpable bilaterally.  Aorta is not palpable.                       VASCULAR EXAM: Extremities without ischemic changes, without Gangrene; without open wounds.                                                                                                                                                             LE Pulses Right Left       FEMORAL  2+palpable  1+ palpable         POPLITEAL  not palpable   not palpable        POSTERIOR TIBIAL  not palpable   2+palpable         DORSALIS PEDIS      ANTERIOR TIBIAL not palpable  not palpable      Musculoskeletal: no muscle wasting or atrophy; no peripheral edema         Neurologic: A&O X 3; Appropriate Affect ;  SENSATION: normal; MOTOR FUNCTION: 5/5 Symmetric, CN 2-12 intact Speech is fluent/normal     ASSESSMENT: Ryan [name] is a 50 y.o. male who is s/p left carotid-subclavian artery bypass graft on 04/03/13 and s/p Aorto-bi-femoral bypass graft on 01/16/13.  He has moderate/severe claudication in his right calf, no rest pain, no signs of ischemia in his feet/legs, no claudication   symptoms in his left leg. He has no steal sx's in his left upper extremity. His atherosclerotic risk factors include uncontrolled DM and active smoking. He is taking a statin, ASA, and Plavix.    DATA:   Right LE arterial duplex (06-04-17): Technically difficult exam due to extensive calcific plaque. Elevated velocity at the distal anastomosis of the right aorto-femoral bypass graft which may be due to homogenous plaque and angle of take off.  50-70% stenosis of the right aorta-femoral bypass graft distal anastomosis (dist. bypass graft: 309 cm/s, distal anastomosis: 299 cm/s, right CFA: 250 cm/s)   Aorto-bifemoral bypass graft Duplex (06/04/17): Decreased  visualization of the abdominal vasculature due to overlying bowel gas and patient body habitus. Elevated velocity at the distal anastomosis of the right aorto-femoral bypass graft (407 cm/s) which may be due to plaque and angle of take off.  Elevated velocity at the distal anastomosis of the left aorto-femoral bypass graft (317 cm/s), which may be due to plaque or the angle of take off.  No prior exam for comparison.     ABI (Date: 06/04/2017)  R:   ABI: 0.76 (was 0.73 on 06-19-16),   PT: mono  DP: mono  TBI:  0.79  L:   ABI: 0.92 (was 1.02),   PT: bi  DP: bi  TBI: 0.92 Stable on the right with moderate arterial occlusive disease, monophasic waveforms; slight decline in the left ABI to mild arterial disease, biphasic waveforms.    Carotid duplex (10-20-16) : Patent left common carotid to subclavian artery bypass graft and 1-39% bilateral proximal internal carotid artery stenoses. No significant change noted when compared to the previous exam on 02/26/12.     PLAN:   The patient was counseled re smoking cessation and given several free resources re smoking cessation   Based on the patient's vascular studies and examination, and after discussing with Dr. Brabham, pt will be scheduled for an arteriogram with bilateral run off, possible intervention bilaterally, on July 24th, access left arm, by Dr. Brabham.   I discussed in depth with the patient the nature of atherosclerosis, and emphasized the importance of maximal medical management including strict control of blood pressure, blood glucose, and lipid levels, obtaining regular exercise, and cessation of smoking.  The patient is aware that without maximal medical management the underlying atherosclerotic disease process will progress, limiting the benefit of any interventions.  The patient was given information about PAD including signs, symptoms, treatment, what symptoms should prompt the patient to seek immediate medical  care, and risk reduction measures to take.  Lorey Pallett, RN, MSN, FNP-C [facility] [place] Office Phone: [phone]  Clinic MD: Brabham  06/04/17 10:42 AM   

## 2017-06-07 ENCOUNTER — Other Ambulatory Visit: Payer: Self-pay

## 2017-06-26 ENCOUNTER — Encounter (HOSPITAL_COMMUNITY)
Admission: RE | Disposition: A | Discharge status: Home / Self Care | Payer: Self-pay | Source: Ambulatory Visit | Attending: Surgery

## 2017-06-26 ENCOUNTER — Encounter: Payer: Self-pay | Admitting: Family

## 2017-06-26 ENCOUNTER — Encounter (HOSPITAL_COMMUNITY): Payer: Self-pay | Admitting: Surgery

## 2017-06-26 ENCOUNTER — Ambulatory Visit (HOSPITAL_COMMUNITY)
Admission: RE | Admit: 2017-06-26 | Discharge: 2017-06-26 | Disposition: A | Discharge status: Home / Self Care | Payer: Medicaid Other | Source: Ambulatory Visit | Attending: Surgery | Admitting: Surgery

## 2017-06-26 DIAGNOSIS — E1151 Type 2 diabetes mellitus with diabetic peripheral angiopathy without gangrene: Secondary | ICD-10-CM | POA: Insufficient documentation

## 2017-06-26 DIAGNOSIS — I10 Essential (primary) hypertension: Secondary | ICD-10-CM | POA: Insufficient documentation

## 2017-06-26 DIAGNOSIS — I70211 Atherosclerosis of native arteries of extremities with intermittent claudication, right leg: Secondary | ICD-10-CM | POA: Insufficient documentation

## 2017-06-26 DIAGNOSIS — I255 Ischemic cardiomyopathy: Secondary | ICD-10-CM | POA: Insufficient documentation

## 2017-06-26 DIAGNOSIS — E782 Mixed hyperlipidemia: Secondary | ICD-10-CM | POA: Insufficient documentation

## 2017-06-26 DIAGNOSIS — Z7902 Long term (current) use of antithrombotics/antiplatelets: Secondary | ICD-10-CM | POA: Insufficient documentation

## 2017-06-26 DIAGNOSIS — F17210 Nicotine dependence, cigarettes, uncomplicated: Secondary | ICD-10-CM | POA: Insufficient documentation

## 2017-06-26 DIAGNOSIS — K219 Gastro-esophageal reflux disease without esophagitis: Secondary | ICD-10-CM | POA: Diagnosis not present

## 2017-06-26 DIAGNOSIS — Z794 Long term (current) use of insulin: Secondary | ICD-10-CM | POA: Insufficient documentation

## 2017-06-26 DIAGNOSIS — I2510 Atherosclerotic heart disease of native coronary artery without angina pectoris: Secondary | ICD-10-CM | POA: Insufficient documentation

## 2017-06-26 DIAGNOSIS — I739 Peripheral vascular disease, unspecified: Secondary | ICD-10-CM | POA: Diagnosis present

## 2017-06-26 DIAGNOSIS — I252 Old myocardial infarction: Secondary | ICD-10-CM | POA: Insufficient documentation

## 2017-06-26 DIAGNOSIS — Z955 Presence of coronary angioplasty implant and graft: Secondary | ICD-10-CM | POA: Insufficient documentation

## 2017-06-26 DIAGNOSIS — Z7982 Long term (current) use of aspirin: Secondary | ICD-10-CM | POA: Diagnosis not present

## 2017-06-26 HISTORY — PX: ABDOMINAL AORTOGRAM W/LOWER EXTREMITY: CATH118223

## 2017-06-26 LAB — POCT I-STAT, CHEM 8
BUN: 18 mg/dL (ref 6–20)
CREATININE: 1 mg/dL (ref 0.61–1.24)
Calcium, Ion: 1.14 mmol/L — ABNORMAL LOW (ref 1.15–1.40)
Chloride: 98 mmol/L — ABNORMAL LOW (ref 101–111)
GLUCOSE: 249 mg/dL — AB (ref 65–99)
HCT: 49 % (ref 39.0–52.0)
HEMOGLOBIN: 16.7 g/dL (ref 13.0–17.0)
Potassium: 4.1 mmol/L (ref 3.5–5.1)
Sodium: 135 mmol/L (ref 135–145)
TCO2: 26 mmol/L (ref 0–100)

## 2017-06-26 LAB — POCT ACTIVATED CLOTTING TIME
ACTIVATED CLOTTING TIME: 219 s
Activated Clotting Time: 213 seconds
Activated Clotting Time: 164 seconds

## 2017-06-26 LAB — GLUCOSE, CAPILLARY
GLUCOSE-CAPILLARY: 220 mg/dL — AB (ref 65–99)
Glucose-Capillary: 269 mg/dL — ABNORMAL HIGH (ref 65–99)
Glucose-Capillary: 224 mg/dL — ABNORMAL HIGH (ref 65–99)

## 2017-06-26 SURGERY — ABDOMINAL AORTOGRAM W/LOWER EXTREMITY
Anesthesia: LOCAL

## 2017-06-26 MED ORDER — HEPARIN (PORCINE) IN NACL 2-0.9 UNIT/ML-% IJ SOLN
INTRAMUSCULAR | Status: AC
  Filled 2017-06-26: qty 1000

## 2017-06-26 MED ORDER — FENTANYL CITRATE (PF) 100 MCG/2ML IJ SOLN
INTRAMUSCULAR | Status: AC
  Filled 2017-06-26: qty 2

## 2017-06-26 MED ORDER — ALUM & MAG HYDROXIDE-SIMETH 200-200-20 MG/5ML PO SUSP: 15.0000 mL | ORAL | Status: DC | PRN

## 2017-06-26 MED ORDER — DOCUSATE SODIUM 100 MG PO CAPS: 100.0000 mg | ORAL_CAPSULE | Freq: Every day | ORAL | Status: DC

## 2017-06-26 MED ORDER — SODIUM CHLORIDE 0.9 % IV SOLN
INTRAVENOUS | Status: DC
  Administered 2017-06-26: 06:00:00 via INTRAVENOUS

## 2017-06-26 MED ORDER — LABETALOL HCL 5 MG/ML IV SOLN: 10.0000 mg | INTRAVENOUS | Status: DC | PRN

## 2017-06-26 MED ORDER — SODIUM CHLORIDE 0.9 % IV SOLN: 500.0000 mL | Freq: Once | INTRAVENOUS | Status: DC | PRN

## 2017-06-26 MED ORDER — GUAIFENESIN-DM 100-10 MG/5ML PO SYRP
15.0000 mL | ORAL_SOLUTION | ORAL | Status: DC | PRN
Start: 2017-06-26 — End: 2017-06-26

## 2017-06-26 MED ORDER — HEPARIN SODIUM (PORCINE) 1000 UNIT/ML IJ SOLN
INTRAMUSCULAR | Status: AC
  Filled 2017-06-26: qty 1

## 2017-06-26 MED ORDER — HEPARIN SODIUM (PORCINE) 1000 UNIT/ML IJ SOLN
INTRAMUSCULAR | Status: DC | PRN
  Administered 2017-06-26: 2000 [IU] via INTRAVENOUS
  Administered 2017-06-26: 8000 [IU] via INTRAVENOUS

## 2017-06-26 MED ORDER — ACETAMINOPHEN 325 MG PO TABS: 325.0000 mg | ORAL_TABLET | ORAL | Status: DC | PRN

## 2017-06-26 MED ORDER — FENTANYL CITRATE (PF) 100 MCG/2ML IJ SOLN
INTRAMUSCULAR | Status: DC | PRN
  Administered 2017-06-26: 50 ug via INTRAVENOUS

## 2017-06-26 MED ORDER — MIDAZOLAM HCL 2 MG/2ML IJ SOLN
INTRAMUSCULAR | Status: DC | PRN
  Administered 2017-06-26: 2 mg via INTRAVENOUS

## 2017-06-26 MED ORDER — LIDOCAINE HCL (PF) 1 % IJ SOLN
INTRAMUSCULAR | Status: AC
  Filled 2017-06-26: qty 30

## 2017-06-26 MED ORDER — SODIUM CHLORIDE 0.9 % IV SOLN: 1.0000 mL/kg/h | INTRAVENOUS | Status: DC

## 2017-06-26 MED ORDER — ONDANSETRON HCL 4 MG/2ML IJ SOLN: 4.0000 mg | Freq: Four times a day (QID) | INTRAMUSCULAR | Status: DC | PRN

## 2017-06-26 MED ORDER — METOPROLOL TARTRATE 5 MG/5ML IV SOLN: 2.0000 mg | INTRAVENOUS | Status: DC | PRN

## 2017-06-26 MED ORDER — HEPARIN (PORCINE) IN NACL 2-0.9 UNIT/ML-% IJ SOLN
INTRAMUSCULAR | Status: AC | PRN
  Administered 2017-06-26: 1000 mL

## 2017-06-26 MED ORDER — PHENOL 1.4 % MT LIQD: 1.0000 | OROMUCOSAL | Status: DC | PRN

## 2017-06-26 MED ORDER — ACETAMINOPHEN 325 MG RE SUPP: 325.0000 mg | RECTAL | Status: DC | PRN

## 2017-06-26 MED ORDER — MIDAZOLAM HCL 2 MG/2ML IJ SOLN
INTRAMUSCULAR | Status: AC
  Filled 2017-06-26: qty 2

## 2017-06-26 MED ORDER — LIDOCAINE HCL (PF) 1 % IJ SOLN
INTRAMUSCULAR | Status: DC | PRN
  Administered 2017-06-26: 5 mL via INTRADERMAL

## 2017-06-26 MED ORDER — NITROGLYCERIN 1 MG/10 ML FOR IR/CATH LAB
INTRA_ARTERIAL | Status: DC | PRN
  Administered 2017-06-26: 200 ug via INTRA_ARTERIAL

## 2017-06-26 MED ORDER — HYDRALAZINE HCL 20 MG/ML IJ SOLN: 5.0000 mg | INTRAMUSCULAR | Status: DC | PRN

## 2017-06-26 MED ORDER — NITROGLYCERIN 1 MG/10 ML FOR IR/CATH LAB
INTRA_ARTERIAL | Status: AC
  Filled 2017-06-26: qty 10

## 2017-06-26 MED ORDER — IODIXANOL 320 MG/ML IV SOLN
INTRAVENOUS | Status: DC | PRN
  Administered 2017-06-26: 140 mL via INTRA_ARTERIAL

## 2017-06-26 MED ORDER — MORPHINE SULFATE (PF) 10 MG/ML IV SOLN: 2.0000 mg | INTRAVENOUS | Status: DC | PRN

## 2017-06-26 SURGICAL SUPPLY — 23 items
BALLN ARMADA 6X20X135 (BALLOONS) ×2
BALLN STERLING OTW 8X20X135 (BALLOONS) ×2
BALLOON ARMADA 6X20X135 (BALLOONS) ×1 IMPLANT
BALLOON STERLING OTW 8X20X135 (BALLOONS) ×1 IMPLANT
CATH ANGIO 5F BER 100CM (CATHETERS) ×2 IMPLANT
CATH ANGIO 5F PIGTAIL 100CM (CATHETERS) ×2 IMPLANT
CATH OMNI FLUSH 5F 65CM (CATHETERS) ×2 IMPLANT
DEVICE CONTINUOUS FLUSH (MISCELLANEOUS) ×2 IMPLANT
KIT ENCORE 26 ADVANTAGE (KITS) ×2 IMPLANT
KIT MICROINTRODUCER STIFF 5F (SHEATH) ×2 IMPLANT
KIT PV (KITS) ×2 IMPLANT
SHEATH PINNACLE 5F 10CM (SHEATH) ×2 IMPLANT
SHEATH SHUTTLE 5F/110 (SHEATH) ×2 IMPLANT
SHIELD RADPAD SCOOP 12X17 (MISCELLANEOUS) ×2 IMPLANT
STOPCOCK MORSE 400PSI 3WAY (MISCELLANEOUS) ×2 IMPLANT
SYR MEDRAD MARK V 150ML (SYRINGE) ×2 IMPLANT
TAPE RADIOPAQUE TURBO (MISCELLANEOUS) ×4 IMPLANT
TRANSDUCER W/STOPCOCK (MISCELLANEOUS) ×2 IMPLANT
TRAY PV CATH (CUSTOM PROCEDURE TRAY) ×2 IMPLANT
TUBING HIGH PRESSURE 120CM (CONNECTOR) ×2 IMPLANT
WIRE BENTSON .035X145CM (WIRE) ×4 IMPLANT
WIRE HI TORQ VERSACORE J 260CM (WIRE) ×2 IMPLANT
WIRE SPARTACORE .014X300CM (WIRE) ×2 IMPLANT

## 2017-06-26 NOTE — H&P (View-Only) (Signed)
[facility] [place]   CC: Follow up peripheral artery occlusive disease  History of Present Illness Ryan [name] is a 51 y.o. male patient of whom Dr. Myra Gianotti initially evaluated in March of 2013. Pt has missed multiple appointments and was last evaluated by Dr. Myra Gianotti on 08/11/13.  At that time by physical exam the patient's left carotid subclavian bypass graft was widely patent. In addition he had faintly palpable pedal pulses. Dr. Myra Gianotti discussed with the patient that he did not feel that his complaints that day in his right arm as well as his left leg were related to blood flow issues. They appeared to be musculoskeletal. Dr. Myra Gianotti planned on seeing him back in 6 months for followup carotid ultrasound to evaluate his carotid subclavian bypass graft, as well as ABIs. Dr. Myra Gianotti encouraged him to meet with his primary care physician to discuss his complaints in the right arm and left leg.  I last evaluated him in August 2017 and advised him to return in 3 months.   He suffered a MI in January 2013, and at that time he had a drug-eluting stent placed to his LAD. During this process, an abdominal aortogram revealed aortic occlusion at the level of the renal arteries. He also has a left subclavian artery occlusion. Because of his drug-eluting stent, Dr. Myra Gianotti could not proceed with surgical revascularization. He underwent aortobifemoral bypass grafting on 01/16/2013. He recovered nicely from this and on 04/03/2013 he underwent left carotid subclavian bypass graft. He had to go back to the operating room on 04/18/2013 for drainage of a lymphocele. He is now recovered from his operations.  He had a coronary angioplasty in November, 2017, found on routine exam by his cardiologist, sx was dyspnea, no chest pain.   His right calf and foot hurts after walking about 80 feet, resolves after several minutes of rest, he feels this has worsened. No problems with the left leg. He  denies non healing wounds.   Pt Diabetic: Yes, states his last A1C was 13, previous was 11, severely uncontrolled DM.  Pt smoker: smoker  (1/2 ppd, started at age 26)  Pt meds include: Statin :Yes Betablocker: Yes ASA: Yes Other anticoagulants/antiplatelets: Plavix    Past Medical History:  Diagnosis Date  . Anal fissure   . Bruises easily    d/t being on Effient  . Coronary atherosclerosis of native coronary artery    a. ant STEMI with cardiac arrest 2013 s/p DES to mLAD. b. [place] 10/2016 s/p PTCA to mLAD.  . Diabetes mellitus without complication (HCC)   . GERD (gastroesophageal reflux disease)    "takes tums"  . History of blood transfusion    no abnormal reaction noted  . History of kidney stones   . Hypertension   . Ischemic cardiomyopathy    a. EF 35% in 02/2012 at time of acute MI, improved to normal on subsequent imaging.  . MI (myocardial infarction) (HCC)    AMI 1/13 - complicated by VT/Tosades  . Mixed hyperlipidemia   . PAD (peripheral artery disease) (HCC)    followed by Vascular - aortobifem bypass 01/2013 & left carotid-subclavian artery bypass 04/2013  . Rectal polyp     Social History Social History  Substance Use Topics  . Smoking status: Current Every Day Smoker    Packs/day: 0.25    Years: 25.00    Types: Cigarettes  . Smokeless tobacco: Never Used  . Alcohol use No    Family History Family History  Problem Relation Age of Onset  . Cancer Father        Lung  . Diabetes Mother   . COPD Mother   . Cancer Maternal Uncle        Colon    Past Surgical History:  Procedure Laterality Date  . ANAL FISSURECTOMY    . AORTA - BILATERAL FEMORAL ARTERY BYPASS GRAFT N/A 01/16/2013   Procedure: AORTA BIFEMORAL BYPASS GRAFT;  Surgeon: Nada Libman, MD;  [facility];  Service: Vascular;  Laterality: N/A;  . APPENDECTOMY    . CARDIAC CATHETERIZATION N/A 10/23/2016   Procedure: Left Heart Cath and Coronary Angiography;  Surgeon: Kathleene Hazel, MD;  Location: [facility] INVASIVE CV LAB;  Service: Cardiovascular;  Laterality: N/A;  . CARDIAC CATHETERIZATION N/A 10/23/2016   Procedure: Coronary Balloon Angioplasty;  Surgeon: Kathleene Hazel, MD;  Location: [facility] INVASIVE CV LAB;  Service: Cardiovascular;  Laterality: N/A;  . CAROTID-SUBCLAVIAN BYPASS GRAFT Left 04/03/2013   Procedure: BYPASS GRAFT CAROTID-SUBCLAVIAN;  Surgeon: Nada Libman, MD;  Location: [facility] OR;  Service: Vascular;  Laterality: Left;  . CIRCUMCISION N/A 12/15/2015   Procedure: CIRCUMCISION ADULT;  Surgeon: Malen Gauze, MD;  Location: [facility] ORS;  Service: Urology;  Laterality: N/A;  . COLONOSCOPY    . CORONARY ANGIOPLASTY     stent placed Dec 12, 2011  . DIAGNOSTIC LAPAROSCOPY    . I&D EXTREMITY Left 04/18/2013   Procedure: debridement of left neck lymphocele;  Surgeon: Nada Libman, MD;  Location: [facility] OR;  Service: Vascular;  Laterality: Left;  I and D of left neck  . I&D EXTREMITY Right 11/21/2014   Procedure: IRRIGATION AND DEBRIDEMENT RIGHT HAND;  Surgeon: Dominica Severin, MD;  Location: [facility] OR;  Service: Orthopedics;  Laterality: Right;  . LEFT HEART CATHETERIZATION WITH CORONARY ANGIOGRAM N/A 12/12/2011   Procedure: LEFT HEART CATHETERIZATION WITH CORONARY ANGIOGRAM;  Surgeon: Kathleene Hazel, MD;  Location: [facility] CATH LAB;  Service: Cardiovascular;  Laterality: N/A;  . LOWER EXTREMITY ANGIOGRAM N/A 01/31/2012   Procedure: LOWER EXTREMITY ANGIOGRAM;  Surgeon: Kathleene Hazel, MD;  Location: [facility] CATH LAB;  Service: Cardiovascular;  Laterality: N/A;  . PERCUTANEOUS CORONARY STENT INTERVENTION (PCI-S) Right 12/12/2011   Procedure: PERCUTANEOUS CORONARY STENT INTERVENTION (PCI-S);  Surgeon: Kathleene Hazel, MD;  Location: [facility] CATH LAB;  Service: Cardiovascular;  Laterality: Right;  . REPAIR EXTENSOR TENDON Right 11/21/2014   Procedure: WITH REPAIR/RECONSTRUCTION OF EXTENSOR TENDONS AS NEEDED;  Surgeon: Dominica Severin, MD;  Location: [facility] OR;  Service:  Orthopedics;  Laterality: Right;    Allergies  Allergen Reactions  . Hydrocodone Nausea And Vomiting and Hives    Current Outpatient Prescriptions  Medication Sig Dispense Refill  . aspirin EC 81 MG tablet Take 1 tablet (81 mg total) by mouth daily. 90 tablet 3  . carvedilol (COREG) 6.25 MG tablet Take 1 tablet (6.25 mg total) by mouth 2 (two) times daily. 180 tablet 3  . clopidogrel (PLAVIX) 75 MG tablet Take 1 tablet (75 mg total) by mouth daily. 90 tablet 3  . enalapril (VASOTEC) 5 MG tablet TAKE ONE TABLET BY MOUTH TWICE DAILY 60 tablet 2  . Insulin Glargine (LANTUS SOLOSTAR) 100 UNIT/ML Solostar Pen Inject 45 Units into the skin at bedtime.     . isosorbide mononitrate (IMDUR) 30 MG 24 hr tablet Take 1 tablet (30 mg total) by mouth daily. 90 tablet 3  . nitroGLYCERIN (NITROSTAT) 0.4 MG SL tablet Place 1 tablet (0.4 mg total) under the tongue  every 5 (five) minutes as needed for chest pain (up to 3 doses). 25 tablet 3  . simvastatin (ZOCOR) 40 MG tablet Take 40 mg by mouth at bedtime.    Marland Kitchen spironolactone (ALDACTONE) 25 MG tablet Take 0.5 tablets (12.5 mg total) by mouth daily. 45 tablet 3  . oxyCODONE (ROXICODONE) 15 MG immediate release tablet Take 1 tablet (15 mg total) by mouth every 4 (four) hours as needed for severe pain. (Patient not taking: Reported on 06/04/2017) 30 tablet 0   No current facility-administered medications for this visit.     ROS: See HPI for pertinent positives and negatives.   Physical Examination  Vitals:   06/04/17 1008  BP: 109/70  Pulse: 62  Resp: 16  Temp: 98.4 F (36.9 C)  SpO2: 98%  Weight: 202 lb (91.6 kg)  Height: 5\' 9"  (1.753 m)   Body mass index is 29.83 kg/m.  General: WDWN in NAD Gait: slight limp HENT: WNL Eyes: Pupils equal Pulmonary: normal non-labored breathing, limited air movement in all fields, no rales, rhonchi, or wheezes + occasional dry cough.  Cardiac: RRR, no murmur detected  Abdomen: soft, NT, + moderate sized  reducible ventral hernia.  Skin: no rashes, no ulcers;  no cellulitis.   VASCULAR EXAM         Carotid Bruits Right Left   Negative positive       Radial pulses are 2+ palpable bilaterally.  Aorta is not palpable.                       VASCULAR EXAM: Extremities without ischemic changes, without Gangrene; without open wounds.                                                                                                                                                             LE Pulses Right Left       FEMORAL  2+palpable  1+ palpable         POPLITEAL  not palpable   not palpable        POSTERIOR TIBIAL  not palpable   2+palpable         DORSALIS PEDIS      ANTERIOR TIBIAL not palpable  not palpable      Musculoskeletal: no muscle wasting or atrophy; no peripheral edema         Neurologic: A&O X 3; Appropriate Affect ;  SENSATION: normal; MOTOR FUNCTION: 5/5 Symmetric, CN 2-12 intact Speech is fluent/normal     ASSESSMENT: Deleon Passe is a 51 y.o. male who is s/p left carotid-subclavian artery bypass graft on 04/03/13 and s/p Aorto-bi-femoral bypass graft on 01/16/13.  He has moderate/severe claudication in his right calf, no rest pain, no signs of ischemia in his feet/legs, no claudication  symptoms in his left leg. He has no steal sx's in his left upper extremity. His atherosclerotic risk factors include uncontrolled DM and active smoking. He is taking a statin, ASA, and Plavix.    DATA:   Right LE arterial duplex (06-04-17): Technically difficult exam due to extensive calcific plaque. Elevated velocity at the distal anastomosis of the right aorto-femoral bypass graft which may be due to homogenous plaque and angle of take off.  50-70% stenosis of the right aorta-femoral bypass graft distal anastomosis (dist. bypass graft: 309 cm/s, distal anastomosis: 299 cm/s, right CFA: 250 cm/s)   Aorto-bifemoral bypass graft Duplex (06/04/17): Decreased  visualization of the abdominal vasculature due to overlying bowel gas and patient body habitus. Elevated velocity at the distal anastomosis of the right aorto-femoral bypass graft (407 cm/s) which may be due to plaque and angle of take off.  Elevated velocity at the distal anastomosis of the left aorto-femoral bypass graft (317 cm/s), which may be due to plaque or the angle of take off.  No prior exam for comparison.     ABI (Date: 06/04/2017)  R:   ABI: 0.76 (was 0.73 on 06-19-16),   PT: mono  DP: mono  TBI:  0.79  L:   ABI: 0.92 (was 1.02),   PT: bi  DP: bi  TBI: 0.92 Stable on the right with moderate arterial occlusive disease, monophasic waveforms; slight decline in the left ABI to mild arterial disease, biphasic waveforms.    Carotid duplex (10-20-16) : Patent left common carotid to subclavian artery bypass graft and 1-39% bilateral proximal internal carotid artery stenoses. No significant change noted when compared to the previous exam on 02/26/12.     PLAN:   The patient was counseled re smoking cessation and given several free resources re smoking cessation   Based on the patient's vascular studies and examination, and after discussing with Dr. Myra Gianotti, pt will be scheduled for an arteriogram with bilateral run off, possible intervention bilaterally, on July 24th, access left arm, by Dr. Myra Gianotti.   I discussed in depth with the patient the nature of atherosclerosis, and emphasized the importance of maximal medical management including strict control of blood pressure, blood glucose, and lipid levels, obtaining regular exercise, and cessation of smoking.  The patient is aware that without maximal medical management the underlying atherosclerotic disease process will progress, limiting the benefit of any interventions.  The patient was given information about PAD including signs, symptoms, treatment, what symptoms should prompt the patient to seek immediate medical  care, and risk reduction measures to take.  Charisse March, RN, MSN, FNP-C [facility] [place] Phone: [phone]  Clinic MD: Myra Gianotti  06/04/17 10:42 AM

## 2017-06-26 NOTE — Progress Notes (Signed)
Inpatient Diabetes Program Recommendations  AACE/ADA: New Consensus Statement on Inpatient Glycemic Control (2015)  Target Ranges:  Prepandial:   less than 140 mg/dL      Peak postprandial:   less than 180 mg/dL (1-2 hours)      Critically ill patients:  140 - 180 mg/dL   Results for Ryan [name], THORESEN (MRN [id number]) as of 06/26/2017 10:28  Ref. Range 06/26/2017 05:40 06/26/2017 10:07  Glucose-Capillary Latest Ref Range: 65 - 99 mg/dL 381 (H) 771 (H)   Review of Glycemic Control  Diabetes history: DM 2 Outpatient Diabetes medications: Lantus 55 units QHS Current orders for Inpatient glycemic control: None  Inpatient Diabetes Program Recommendations:    Glucose 200's, Last A1c was 13% per NP note. Consider placing patient on at least Novolog Moderate Correction 0-15 units Q4 hours for glucose control while here.  Thanks,  Christena Deem RN, MSN, [facility] Inpatient Diabetes Coordinator Team Pager [phone] (8a-5p)

## 2017-06-26 NOTE — Progress Notes (Signed)
35fr sheath aspirated and removed from left brachial artery. Manual pressure applied for 30 minutes. Site level 0, slight bruising present. Spo2 95% as measured in left thumb. Left radial pulse easily palpable. Tegaderm dressing applied, bedrest instructions given. Arm board in place to keep left arm straight.   Bedrest begins at 11:05:00

## 2017-06-26 NOTE — Progress Notes (Signed)
Patient stated at approximately 1335 that he was leaving. Patient had no complaints when asked and he did not have any issues with the care he received.  He said he needed to go smoke a cigarette and that he was fine but he just needed to go.  He was informed that his doctor ordered him to rest until 15:05 while being monitored and he was informed and educated of the reasonable risk of complications and death associated leaving against medical advice.  AMA form, discharge and self care instructions given with his friend as a witness.  Patient's IV removed.  Patient taken to the main entrance via wheel chair in stable condition with no complaints.

## 2017-06-26 NOTE — Op Note (Signed)
Patient name: Ryan [name] MRN: [id number] DOB: 05-Jan-1966 Sex: male  06/26/2017 Pre-operative Diagnosis: Right leg claudication Post-operative diagnosis:  Same Surgeon:  Durene Cal Procedure Performed:  1.  Ultrasound-guided access, left brachial artery  2.  Abdominal aortogram  3.  Right lower extremity runoff  4.  Third order catheterization  5.  Angioplasty, right common femoral artery  6.  Conscious sedation (84 minutes)     Indications:  The patient has a history of an aortobifemoral bypass graft.  He has developed progressive right leg claudication of the course of the past year.  He comes in today for angiographic evaluation is ultrasound indicated elevated velocities within the right femoral anastomosis.  Procedure:  The patient was identified in the holding area and taken to room 8.  The patient was then placed supine on the table and prepped and draped in the usual sterile fashion.  A time out was called.  Conscious sedation was administered with the use of IV fentanyl and Versed in a continuous physician and nurse monitoring.  Heart rate, blood pressure, and oxygen saturation continuously monitored.  Ultrasound was used to evaluate the left brachial artery.  It was patent .  A digital ultrasound image was acquired.  A micropuncture needle was used to access the left brachial artery under ultrasound guidance.  An 018 wire was advanced without resistance and a micropuncture sheath was placed.  The 018 wire was removed and a benson wire was placed.  The micropuncture sheath was exchanged for a 5 french sheath.  A pigtail, omniflush, and Berenstein catheter were used to gain access into the descending thoracic aorta.  Once this was accomplished, a pigtail catheter was placed at the level of L1 and an abdominal aortogram was performed.  Next, the Berenstein catheter was used to select the right limb of the graft and a right lower extremity runoff was performed. Findings:    Aortogram:  No significant renal artery stenosis.  A aortic graft is visualized.  The proximal anastomosis is widely patent.  Both limbs of the graft are widely patent.  Right Lower Extremity:  A 75% stenosis is noted at the distal end of the right limb of the graft.  The common femoral profunda femoral artery are widely patent.  The superficial femoral artery occludes just beyond its origin and reconstitutes in the adductor canal with two-vessel runoff via the posterior tibial and peroneal artery  Left Lower Extremity:  Not evaluated  Intervention:  After the above images were acquired the decision was made to proceed with intervention.  The patient was fully heparinized A 5 French 110 cm sheath was advanced into the right limb of the graft.  I initially selected a 6 x 20 balloon and perform balloon angioplasty.  This gave a improved results within the common femoral artery after taking the balloon to nominal pressure for 30 seconds.  I wanted to up size to a 8 mm balloon in order to do so I did a wire exchange to place a [place] core wire.  I then used an 8 x 20 Sterling balloon and repeated balloon angioplasty in the distal limb of the graft and the common femoral artery.  This was taken to 1 minute for nominal pressure.  Follow-up imaging revealed significant improvement in the residual stenosis with stenosis less than 10%.  I was satisfied with these results.  There was some haziness which I felt was more related to the neointima that was disturbed so I did not  elect to repeat balloon angioplasty.  At this point catheters and wires were removed.  The long sheath was exchanged out for a 5 [place] short sheath.  The patient taken the holding area for sheath pull once his granulation profile corrects  Impression:  #1  75% stenosis within the distal right limb of the aortobifemoral graft.  This was successfully dilated with a 8 x 20 Sterling balloon with residual stenosis less than 10%  #2  occluded right  superficial femoral artery  #3  if the patient's symptoms are not improved, consideration for right femoral to above-knee popliteal artery bypass graft or possibly percutaneous treatment however I feel the treatment distance is at least 150 cm, therefore appropriate devices would need to be acquired to make sure that we could reach the distal end of the lesion.   Juleen China, M.D. [facility] [place] Office: [phone] Pager:  [phone]

## 2017-06-26 NOTE — Discharge Instructions (Signed)
Brachial Site Care °Refer to this sheet in the next few weeks. These instructions provide you with information about caring for yourself after your procedure. Your health care provider may also give you more specific instructions. Your treatment has been planned according to current medical practices, but problems sometimes occur. Call your health care provider if you have any problems or questions after your procedure. °What can I expect after the procedure? °After your procedure, it is typical to have the following: °· Bruising at the radial site that usually fades within 1-2 weeks. °· Blood collecting in the tissue (hematoma) that may be painful to the touch. It should usually decrease in size and tenderness within 1-2 weeks. ° °Follow these instructions at home: °· Take medicines only as directed by your health care provider. °· You may shower 24-48 hours after the procedure or as directed by your health care provider. Remove the bandage (dressing) and gently wash the site with plain soap and water. Pat the area dry with a clean towel. Do not rub the site, because this may cause bleeding. °· Do not take baths, swim, or use a hot tub until your health care provider approves. °· Check your insertion site every day for redness, swelling, or drainage. °· Do not apply powder or lotion to the site. °· Do not flex or bend the affected arm for 24 hours or as directed by your health care provider. °· Do not push or pull heavy objects with the affected arm for 24 hours or as directed by your health care provider. °· Do not lift over 10 lb (4.5 kg) for 5 days after your procedure or as directed by your health care provider. °· Ask your health care provider when it is okay to: °? Return to work or school. °? Resume usual physical activities or sports. °? Resume sexual activity. °· Do not drive home if you are discharged the same day as the procedure. Have someone else drive you. °· You may drive 24 hours after the procedure  unless otherwise instructed by your health care provider. °· Do not operate machinery or power tools for 24 hours after the procedure. °· If your procedure was done as an outpatient procedure, which means that you went home the same day as your procedure, a responsible adult should be with you for the first 24 hours after you arrive home. °· Keep all follow-up visits as directed by your health care provider. This is important. °Contact a health care provider if: °· You have a fever. °· You have chills. °· You have increased bleeding from the radial site. Hold pressure on the site. °Get help right away if: °· You have unusual pain at the radial site. °· You have redness, warmth, or swelling at the radial site. °· You have drainage (other than a small amount of blood on the dressing) from the radial site. °· The radial site is bleeding, and the bleeding does not stop after 30 minutes of holding steady pressure on the site. °· Your arm or hand becomes pale, cool, tingly, or numb. °This information is not intended to replace advice given to you by your health care provider. Make sure you discuss any questions you have with your health care provider. °Document Released: 12/23/2010 Document Revised: 04/27/2016 Document Reviewed: 06/08/2014 °Elsevier Interactive Patient Education © 2018 Elsevier Inc. ° °

## 2017-06-26 NOTE — Interval H&P Note (Signed)
History and Physical Interval Note:  06/26/2017 7:17 AM  Ryan [name]  has presented today for surgery, with the diagnosis of pvd  The various methods of treatment have been discussed with the patient and family. After consideration of risks, benefits and other options for treatment, the patient has consented to  Procedure(s): Abdominal Aortogram w/Lower Extremity (N/A) as a surgical intervention .  The patient's history has been reviewed, patient examined, no change in status, stable for surgery.  I have reviewed the patient's chart and labs.  Questions were answered to the patient's satisfaction.     Ryan [name], [place]  Right leg claudication, s/p ABF with elevated right femoral velocities.  Plan left brachial access.  WB

## 2017-06-27 ENCOUNTER — Telehealth: Payer: Self-pay | Admitting: Surgery

## 2017-06-27 NOTE — Telephone Encounter (Signed)
-----   Message from Sharee Pimple, RN sent at 06/26/2017 12:07 PM EDT ----- Regarding: 4 weeks with 3 labs   ----- Message ----- From: Nada Libman, MD Sent: 06/26/2017   9:11 AM To: Vvs Charge Pool  06/26/2017  Surgeon:  Durene Cal Procedure Performed:  1.  Ultrasound-guided access, left brachial artery  2.  Abdominal aortogram  3.  Right lower extremity runoff  4.  Third order catheterization  5.  Angioplasty, right common femoral artery  6.  Conscious sedation (84 minutes)  Please schedule the patient is seen back in the office in one month with ABIs and duplex as well as lower extremity vein mapping

## 2017-06-27 NOTE — Telephone Encounter (Signed)
Pt already had a 1 yr carotid + NP f/u sched on 07/16/17. Cxl'd NP appt and consolidated carotid study.   B/c of insurance, we can not schedule a venous study and an arterial study on the same day.   Sched carotid and ABI 07/17/17 at 10:00. Sched GSV mapping 07/18/17 at 4:00. And sched LE Art + MD 07/23/17; lab at 10:00 and MD at 10:45.  Lm on hm#.

## 2017-07-04 NOTE — Addendum Note (Signed)
Addended by: Burton Apley A on: 07/04/2017 02:11 PM   Modules accepted: Orders

## 2017-07-09 ENCOUNTER — Ambulatory Visit: Payer: Medicaid Other | Admitting: Family

## 2017-07-09 ENCOUNTER — Encounter (HOSPITAL_COMMUNITY): Payer: Medicaid Other

## 2017-07-11 ENCOUNTER — Encounter: Payer: Self-pay | Admitting: Surgery

## 2017-07-16 ENCOUNTER — Ambulatory Visit: Payer: Medicaid Other | Admitting: Family

## 2017-07-16 ENCOUNTER — Encounter (HOSPITAL_COMMUNITY): Payer: Medicaid Other

## 2017-07-17 ENCOUNTER — Ambulatory Visit (HOSPITAL_COMMUNITY)
Admission: RE | Admit: 2017-07-17 | Discharge: 2017-07-17 | Disposition: A | Discharge status: Home / Self Care | Payer: Medicaid Other | Source: Ambulatory Visit | Attending: Vascular Surgery | Admitting: Vascular Surgery

## 2017-07-17 ENCOUNTER — Ambulatory Visit (HOSPITAL_COMMUNITY): Payer: Medicaid Other

## 2017-07-17 DIAGNOSIS — IMO0002 Reserved for concepts with insufficient information to code with codable children: Secondary | ICD-10-CM

## 2017-07-17 DIAGNOSIS — I7409 Other arterial embolism and thrombosis of abdominal aorta: Secondary | ICD-10-CM | POA: Insufficient documentation

## 2017-07-17 DIAGNOSIS — E1151 Type 2 diabetes mellitus with diabetic peripheral angiopathy without gangrene: Secondary | ICD-10-CM | POA: Insufficient documentation

## 2017-07-17 DIAGNOSIS — E1165 Type 2 diabetes mellitus with hyperglycemia: Secondary | ICD-10-CM | POA: Diagnosis present

## 2017-07-17 DIAGNOSIS — F17200 Nicotine dependence, unspecified, uncomplicated: Secondary | ICD-10-CM | POA: Diagnosis not present

## 2017-07-18 ENCOUNTER — Ambulatory Visit (HOSPITAL_COMMUNITY): Payer: Medicaid Other

## 2017-07-23 ENCOUNTER — Ambulatory Visit (INDEPENDENT_AMBULATORY_CARE_PROVIDER_SITE_OTHER): Payer: Medicaid Other | Admitting: Surgery

## 2017-07-23 ENCOUNTER — Telehealth: Payer: Self-pay | Admitting: Cardiovascular Disease

## 2017-07-23 ENCOUNTER — Ambulatory Visit (HOSPITAL_COMMUNITY)
Admission: RE | Admit: 2017-07-23 | Discharge: 2017-07-23 | Disposition: A | Discharge status: Home / Self Care | Payer: Medicaid Other | Source: Ambulatory Visit | Attending: Family | Admitting: Family

## 2017-07-23 VITALS — BP 108/60 | HR 69 | Ht 69.0 in | Wt 205.0 lb

## 2017-07-23 DIAGNOSIS — F17200 Nicotine dependence, unspecified, uncomplicated: Secondary | ICD-10-CM | POA: Insufficient documentation

## 2017-07-23 DIAGNOSIS — I70211 Atherosclerosis of native arteries of extremities with intermittent claudication, right leg: Secondary | ICD-10-CM

## 2017-07-23 DIAGNOSIS — Z48812 Encounter for surgical aftercare following surgery on the circulatory system: Secondary | ICD-10-CM | POA: Insufficient documentation

## 2017-07-23 DIAGNOSIS — Z95828 Presence of other vascular implants and grafts: Secondary | ICD-10-CM | POA: Diagnosis not present

## 2017-07-23 DIAGNOSIS — I771 Stricture of artery: Secondary | ICD-10-CM | POA: Insufficient documentation

## 2017-07-23 NOTE — Telephone Encounter (Signed)
Patient informed. 

## 2017-07-23 NOTE — Telephone Encounter (Signed)
Ryan [name] was seen in the Vascular surgery office today by Dr. Myra Gianotti and surgery is being planned on his lower extremity. He will need cardiac clearance. Can we arrange for him to come into the office this week and see me? I could see him at 2pm on Wednesday or Thursday. Thanks, chris

## 2017-07-23 NOTE — Progress Notes (Signed)
[facility] [place]  Patient name: Ryan [name] MRN: [id number] DOB: 07-24-1966 Sex: male   REASON FOR VISIT:    Follow up claudication  HISOTRY OF PRESENT ILLNESS:    Ryan [name] is a 51 y.o. male who have known for a while with a history of claudication.  He enters went aortobifemoral bypass graft using a 14 x 7 bifurcated graft on 01/16/2013.  He went on to develop subclavian steal with left arm claudication and on 04/03/2013 he had a left carotid subclavian bypass graft with 8 mm dacryon.  He had to go back to the operating room 516 for drainage of a lymphocele.  At his most recent ultrasound there were elevated velocities within the right groin at the distal anastomosis.  The patient is having significant symptoms of claudication which is significantly altering his quality of life.  At angiography, I was able to perform balloon angioplasty of the right common femoral artery.  Unfortunately he has not had significant improvement in his symptoms.  He is here today to discuss surgical intervention.  The patient has a history of coronary artery disease and has undergone multiple percutaneous interventions.  He takes a statin for hypercholesterolemia.  He is on aspirin and Plavix for antiplatelet therapy.   PAST MEDICAL HISTORY:   Past Medical History:  Diagnosis Date  . Anal fissure   . Bruises easily    d/t being on Effient  . Coronary atherosclerosis of native coronary artery    a. ant STEMI with cardiac arrest 2013 s/p DES to mLAD. b. [place] 10/2016 s/p PTCA to mLAD.  . Diabetes mellitus without complication (HCC)   . GERD (gastroesophageal reflux disease)    "takes tums"  . History of blood transfusion    no abnormal reaction noted  . History of kidney stones   . Hypertension   . Ischemic cardiomyopathy    a. EF 35% in 02/2012 at time of acute MI, improved to normal on subsequent imaging.  . MI (myocardial infarction)  (HCC)    AMI 1/13 - complicated by VT/Tosades  . Mixed hyperlipidemia   . PAD (peripheral artery disease) (HCC)    followed by Vascular - aortobifem bypass 01/2013 & left carotid-subclavian artery bypass 04/2013  . Rectal polyp      FAMILY HISTORY:   Family History  Problem Relation Age of Onset  . Cancer Father        Lung  . Diabetes Mother   . COPD Mother   . Cancer Maternal Uncle        Colon    SOCIAL HISTORY:   Social History  Substance Use Topics  . Smoking status: Current Every Day Smoker    Packs/day: 0.25    Years: 25.00    Types: Cigarettes  . Smokeless tobacco: Never Used  . Alcohol use No     ALLERGIES:   Allergies  Allergen Reactions  . Hydrocodone Nausea And Vomiting and Hives     CURRENT MEDICATIONS:   Current Outpatient Prescriptions  Medication Sig Dispense Refill  . aspirin EC 81 MG tablet Take 1 tablet (81 mg total) by mouth daily. 90 tablet 3  . calcium carbonate (TUMS - DOSED IN MG ELEMENTAL CALCIUM) 500 MG chewable tablet Chew 1 tablet by mouth daily as needed for indigestion or heartburn.    . carvedilol (COREG) 6.25 MG tablet Take 1 tablet (6.25 mg total) by mouth 2 (two) times daily. 180 tablet 3  . clopidogrel (PLAVIX) 75 MG  tablet Take 1 tablet (75 mg total) by mouth daily. 90 tablet 3  . enalapril (VASOTEC) 5 MG tablet TAKE ONE TABLET BY MOUTH TWICE DAILY 60 tablet 2  . Insulin Glargine (LANTUS SOLOSTAR) 100 UNIT/ML Solostar Pen Inject 65 Units into the skin at bedtime.     . isosorbide mononitrate (IMDUR) 30 MG 24 hr tablet Take 1 tablet (30 mg total) by mouth daily. 90 tablet 3  . simvastatin (ZOCOR) 40 MG tablet Take 40 mg by mouth at bedtime.    Marland Kitchen spironolactone (ALDACTONE) 25 MG tablet Take 0.5 tablets (12.5 mg total) by mouth daily. 45 tablet 3  . traZODone (DESYREL) 100 MG tablet Take 100 mg by mouth at bedtime as needed for sleep.     No current facility-administered medications for this visit.     REVIEW OF SYSTEMS:    [X]  denotes positive finding, [ ]  denotes negative finding Cardiac  Comments:  Chest pain or chest pressure:    Shortness of breath upon exertion:    Short of breath when lying flat:    Irregular heart rhythm:        Vascular    Pain in calf, thigh, or hip brought on by ambulation: x   Pain in feet at night that wakes you up from your sleep:     Blood clot in your veins:    Leg swelling:         Pulmonary    Oxygen at home:    Productive cough:     Wheezing:         Neurologic    Sudden weakness in arms or legs:     Sudden numbness in arms or legs:     Sudden onset of difficulty speaking or slurred speech:    Temporary loss of vision in one eye:     Problems with dizziness:         Gastrointestinal    Blood in stool:     Vomited blood:         Genitourinary    Burning when urinating:     Blood in urine:        Psychiatric    Major depression:         Hematologic    Bleeding problems:    Problems with blood clotting too easily:        Skin    Rashes or ulcers:        Constitutional    Fever or chills:      PHYSICAL EXAM:   Vitals:   07/23/17 1011 07/23/17 1012  BP: 106/63 108/60  Pulse: 85 69  SpO2: 97%   Weight: 205 lb (93 kg)   Height: 5\' 9"  (1.753 m)     GENERAL: The patient is a well-nourished male, in no acute distress. The vital signs are documented above. CARDIAC: There is a regular rate and rhythm.  VASCULAR: Palpable femoral pulses PULMONARY: Non-labored respirations ABDOMEN: Soft and non-tender with normal pitched bowel sounds.  MUSCULOSKELETAL: There are no major deformities or cyanosis. NEUROLOGIC: No focal weakness or paresthesias are detected. SKIN: There are no ulcers or rashes noted. PSYCHIATRIC: The patient has a normal affect.  STUDIES:   ABI on the right is 0.76.  The left is 0.9 to.  Essentially unchanged  Vein mapping was denied by insurance.  I looked at the saphenous vein with sono site.  It was difficult to  visualize  MEDICAL ISSUES:   Right leg claudication: The patient's symptoms  are lifestyle limiting and he wants this addressed.  We have agreed to proceed with a right femoral to above-knee popliteal artery bypass graft.  I will use saphenous vein if it appears adequate.  If not I will use Gore-Tex.  I will also further evaluate the right groin to see if he needs endarterectomy given the findings on his recent arteriogram.  As this scheduled for Friday, August 31.  I will stop his Plavix 1 week prior to the operation.  I will get cardiac clearance from Dr. Melony Overly, MD [facility] [phone] Pager [phone]

## 2017-07-23 NOTE — Telephone Encounter (Signed)
New message    Pt is calling back. He missed a call from nurse. Please call.

## 2017-07-23 NOTE — Telephone Encounter (Signed)
See previous phone note for details 

## 2017-07-24 ENCOUNTER — Telehealth: Payer: Self-pay | Admitting: Surgery

## 2017-07-24 NOTE — Telephone Encounter (Signed)
This patient is scheduled to see Dr.McAlhany for pre-op cardiac clearance on 07/26/17 at 2pm. And he is aware of the appointment. awt

## 2017-07-25 ENCOUNTER — Encounter: Payer: Self-pay | Admitting: Cardiovascular Disease

## 2017-07-26 ENCOUNTER — Encounter: Payer: Self-pay | Admitting: Cardiovascular Disease

## 2017-07-26 ENCOUNTER — Ambulatory Visit (INDEPENDENT_AMBULATORY_CARE_PROVIDER_SITE_OTHER): Payer: Medicaid Other | Admitting: Cardiovascular Disease

## 2017-07-26 VITALS — BP 90/60 | HR 72 | Ht 69.0 in | Wt 207.0 lb

## 2017-07-26 DIAGNOSIS — I739 Peripheral vascular disease, unspecified: Secondary | ICD-10-CM

## 2017-07-26 DIAGNOSIS — I25812 Atherosclerosis of bypass graft of coronary artery of transplanted heart without angina pectoris: Secondary | ICD-10-CM | POA: Diagnosis not present

## 2017-07-26 DIAGNOSIS — Z720 Tobacco use: Secondary | ICD-10-CM

## 2017-07-26 DIAGNOSIS — Z01810 Encounter for preprocedural cardiovascular examination: Secondary | ICD-10-CM | POA: Diagnosis not present

## 2017-07-26 NOTE — Patient Instructions (Signed)

## 2017-07-26 NOTE — Progress Notes (Signed)
Chief Complaint  Patient presents with  . Follow-up    CAD    History of Present Illness: 51 yo male with history of ongoing tobacco abuse, CAD, HLD, PAD who is here today for cardiac follow up. He had an anterior STEMI in January 2013 with cardiac arrest and found to have an occluded mid LAD treated with a drug eluting stent. The third OM branch was chronically occluded and the RCA had mild disease. His post MI LVEF was 35% but this improved and last echo in March 2013 with normal LV systolic function. Cardiac cath in November 2017 with severe stent restenosis in the LAD, treated with a cutting balloon angioplasty. He did not keep his f/u appt in our office following his PCI. His PAD is followed in [facility] by DR. Brabham. In 2013 he was found to have occlusion of his distal aorta and left subclavian artery. He underwent aortobifemoral bypass in February 2014 and left carotid to subclavian bypass in May 2014. He has had worsened right leg claudication and found to have a 75% stenosis in the distal limb of the graft treated with balloon angioplasty but very little change in symptoms. Dr. Myra Gianotti is planning right femoral to above knee bypass. He continues to smoke.   He is here today for follow up. The patient denies any chest pain, dyspnea, palpitations, lower extremity edema, orthopnea, PND, dizziness, near syncope or syncope. Only c/o right leg pain with walking.     Primary Care Physician: Fleet Contras, MD   Past Medical History:  Diagnosis Date  . Anal fissure   . Bruises easily    d/t being on Effient  . Coronary atherosclerosis of native coronary artery    a. ant STEMI with cardiac arrest 2013 s/p DES to mLAD. b. [place] 10/2016 s/p PTCA to mLAD.  . Diabetes mellitus without complication (HCC)   . GERD (gastroesophageal reflux disease)    "takes tums"  . History of blood transfusion    no abnormal reaction noted  . History of kidney stones   . Hypertension   . Ischemic cardiomyopathy      a. EF 35% in 02/2012 at time of acute MI, improved to normal on subsequent imaging.  . MI (myocardial infarction) (HCC)    AMI 1/13 - complicated by VT/Tosades  . Mixed hyperlipidemia   . PAD (peripheral artery disease) (HCC)    followed by [facility] - aortobifem bypass 01/2013 & left carotid-subclavian artery bypass 04/2013  . Rectal polyp     Past Surgical History:  Procedure Laterality Date  . ABDOMINAL AORTOGRAM W/LOWER EXTREMITY N/A 06/26/2017   Procedure: Abdominal Aortogram w/Lower Extremity;  Surgeon: Nada Libman, MD;  Location: [facility]IVE [facility];  Service: Cardiovascular;  Laterality: N/A;  . ANAL FISSURECTOMY    . AORTA - BILATERAL FEMORAL ARTERY BYPASS GRAFT N/A 01/16/2013   Procedure: AORTA BIFEMORAL BYPASS GRAFT;  Surgeon: Nada Libman, MD;  Location: [facility] OR;  Service: Vascular;  Laterality: N/A;  . APPENDECTOMY    . CARDIAC CATHETERIZATION N/A 10/23/2016   Procedure: Left Heart Cath and Coronary Angiography;  Surgeon: Kathleene Hazel, MD;  Location: [facility] INVASIVE CV LAB;  Service: Cardiovascular;  Laterality: N/A;  . CARDIAC CATHETERIZATION N/A 10/23/2016   Procedure: Coronary Balloon Angioplasty;  Surgeon: Kathleene Hazel, MD;  Location: [facility] INVASIVE CV LAB;  Service: Cardiovascular;  Laterality: N/A;  . CAROTID-SUBCLAVIAN BYPASS GRAFT Left 04/03/2013   Procedure: BYPASS GRAFT CAROTID-SUBCLAVIAN;  Surgeon: Nada Libman, MD;  Location: [facility] OR;  Service: Vascular;  Laterality: Left;  . CIRCUMCISION N/A 12/15/2015   Procedure: CIRCUMCISION ADULT;  Surgeon: Malen Gauze, MD;  Location: [facility];  Service: Urology;  Laterality: N/A;  . COLONOSCOPY    . CORONARY ANGIOPLASTY     stent placed Dec 12, 2011  . DIAGNOSTIC LAPAROSCOPY    . I&D EXTREMITY Left 04/18/2013   Procedure: debridement of left neck lymphocele;  Surgeon: Nada Libman, MD;  Location: [facility] OR;  Service: Vascular;  Laterality: Left;  I and D of left neck  . I&D EXTREMITY Right 11/21/2014    Procedure: IRRIGATION AND DEBRIDEMENT RIGHT HAND;  Surgeon: Dominica Severin, MD;  Location: [facility] OR;  Service: Orthopedics;  Laterality: Right;  . LEFT HEART CATHETERIZATION WITH CORONARY ANGIOGRAM N/A 12/12/2011   Procedure: LEFT HEART CATHETERIZATION WITH CORONARY ANGIOGRAM;  Surgeon: Kathleene Hazel, MD;  Location: [facility] CATH LAB;  Service: Cardiovascular;  Laterality: N/A;  . LOWER EXTREMITY ANGIOGRAM N/A 01/31/2012   Procedure: LOWER EXTREMITY ANGIOGRAM;  Surgeon: Kathleene Hazel, MD;  Location: [facility] CATH LAB;  Service: Cardiovascular;  Laterality: N/A;  . PERCUTANEOUS CORONARY STENT INTERVENTION (PCI-S) Right 12/12/2011   Procedure: PERCUTANEOUS CORONARY STENT INTERVENTION (PCI-S);  Surgeon: Kathleene Hazel, MD;  Location: [facility] CATH LAB;  Service: Cardiovascular;  Laterality: Right;  . REPAIR EXTENSOR TENDON Right 11/21/2014   Procedure: WITH REPAIR/RECONSTRUCTION OF EXTENSOR TENDONS AS NEEDED;  Surgeon: Dominica Severin, MD;  Location: [facility] OR;  Service: Orthopedics;  Laterality: Right;    Current Outpatient Prescriptions  Medication Sig Dispense Refill  . aspirin EC 81 MG tablet Take 1 tablet (81 mg total) by mouth daily. 90 tablet 3  . calcium carbonate (TUMS - DOSED IN MG ELEMENTAL CALCIUM) 500 MG chewable tablet Chew 1 tablet by mouth daily as needed for indigestion or heartburn.    . carvedilol (COREG) 6.25 MG tablet Take 1 tablet (6.25 mg total) by mouth 2 (two) times daily. 180 tablet 3  . clopidogrel (PLAVIX) 75 MG tablet Take 1 tablet (75 mg total) by mouth daily. 90 tablet 3  . enalapril (VASOTEC) 5 MG tablet TAKE ONE TABLET BY MOUTH TWICE DAILY 60 tablet 2  . Insulin Glargine (LANTUS SOLOSTAR) 100 UNIT/ML Solostar Pen Inject 65 Units into the skin at bedtime.     . isosorbide mononitrate (IMDUR) 30 MG 24 hr tablet Take 1 tablet (30 mg total) by mouth daily. 90 tablet 3  . simvastatin (ZOCOR) 40 MG tablet Take 40 mg by mouth at bedtime.    Marland Kitchen spironolactone (ALDACTONE) 25 MG  tablet Take 0.5 tablets (12.5 mg total) by mouth daily. 45 tablet 3  . traZODone (DESYREL) 100 MG tablet Take 100 mg by mouth at bedtime as needed for sleep.     No current facility-administered medications for this visit.     Allergies  Allergen Reactions  . Hydrocodone Nausea And Vomiting and Hives    Social History   Social History  . Marital status: Divorced    Spouse name: N/A  . Number of children: N/A  . Years of education: N/A   Occupational History  .  Other   Social History Main Topics  . Smoking status: Current Every Day Smoker    Packs/day: 0.25    Years: 25.00    Types: Cigarettes  . Smokeless tobacco: Never Used  . Alcohol use No  . Drug use: No  . Sexual activity: Yes   Other Topics Concern  . Not on file  Social History Narrative   Disabled - no longer works       Family History  Problem Relation Age of Onset  . Cancer Father        Lung  . Diabetes Mother   . COPD Mother   . Cancer Maternal Uncle        Colon    Review of Systems:  As stated in the HPI and otherwise negative.   BP 90/60   Pulse 72   Ht 5\' 9"  (1.753 m)   Wt 207 lb (93.9 kg)   SpO2 97%   BMI 30.57 kg/m   Physical Examination:  General: Well developed, well nourished, NAD  HEENT: OP clear, mucus membranes moist  SKIN: warm, dry. No rashes. Neuro: No focal deficits  Musculoskeletal: Muscle strength 5/5 all ext  Psychiatric: Mood and affect normal  Neck: No JVD  Lungs:Clear bilaterally, no wheezes, rhonci, crackles Cardiovascular: Regular rate and rhythm. No murmurs, gallops or rubs. Abdomen:Soft. Bowel sounds present. Non-tender.  Extremities: No lower extremity edema.   EKG:  EKG is ordered today. The ekg ordered today demonstrates NSR, rate 74 bpm. Anterolateral and lateral T wave inversion, unchanged from EKG in November 2017.  Recent Labs: 10/11/2016: Platelets 224 06/26/2017: BUN 18; Creatinine, Ser 1.00; Hemoglobin 16.7; Potassium 4.1; Sodium 135    Lipid Panel Followed in primary care   Wt Readings from Last 3 Encounters:  07/26/17 207 lb (93.9 kg)  07/23/17 205 lb (93 kg)  06/26/17 202 lb (91.6 kg)     Other studies Reviewed: Additional studies/ records that were reviewed today include: . Review of the above records demonstrates:    Assessment and Plan:   1. CAD without angina: He has no recent chest pain concerning for unstable angina. He has undergone balloon angioplasty of severe mid LAD stent restenosis in November 2017. Chronic occlusion of OM3. Will continue ASA, Plavix, statin and beta blocker.  -He can proceed with his planned surgical procedure. He will hold Plavix 5 days before his surgery.  (see below)  2. PAD: Upcoming right fem/pop bypass per Dr. Myra Gianotti.   3. Tobacco abuse: Cessation encouraged.   4. Pre-operative cardiovascular risk assessment:  His CAD seems to be stable. He is mostly limited by right leg pain when walking more than 200 feet. No recent chest pain or dyspnea worrisome for angina. EKG is unchanged. BP is controlled. He can proceed with his planned surgical procedure without further cardiac workup.   Current medicines are reviewed at length with the patient today.  The patient does not have concerns regarding medicines.  The following changes have been made:  no change  Labs/ tests ordered today include:   Orders Placed This Encounter  Procedures  . EKG 12-Lead    Disposition:   FU with me in 12  months  Signed, [name], MD 07/26/2017 2:50 PM    [facility] [address] [place], [place], [place]  [address] Phone: [phone]; Fax: [phone]

## 2017-07-27 ENCOUNTER — Other Ambulatory Visit: Payer: Self-pay

## 2017-07-31 ENCOUNTER — Telehealth: Payer: Self-pay | Admitting: Surgery

## 2017-07-31 DIAGNOSIS — J432 Centrilobular emphysema: Secondary | ICD-10-CM | POA: Insufficient documentation

## 2017-07-31 DIAGNOSIS — I2102 ST elevation (STEMI) myocardial infarction involving left anterior descending coronary artery: Secondary | ICD-10-CM | POA: Insufficient documentation

## 2017-07-31 DIAGNOSIS — J449 Chronic obstructive pulmonary disease, unspecified: Secondary | ICD-10-CM | POA: Insufficient documentation

## 2017-07-31 DIAGNOSIS — E871 Hypo-osmolality and hyponatremia: Secondary | ICD-10-CM | POA: Insufficient documentation

## 2017-07-31 DIAGNOSIS — R7401 Elevation of levels of liver transaminase levels: Secondary | ICD-10-CM | POA: Insufficient documentation

## 2017-07-31 DIAGNOSIS — Z91199 Patient's noncompliance with other medical treatment and regimen due to unspecified reason: Secondary | ICD-10-CM | POA: Insufficient documentation

## 2017-07-31 DIAGNOSIS — Z9119 Patient's noncompliance with other medical treatment and regimen: Secondary | ICD-10-CM | POA: Insufficient documentation

## 2017-07-31 HISTORY — DX: ST elevation (STEMI) myocardial infarction involving left anterior descending coronary artery: I21.02

## 2017-07-31 NOTE — Telephone Encounter (Addendum)
I called Ryan [name] to advise him of his procedure (Fem-Pop (R), BP with vein, Femoral Endarterectomy) time on 08/03/17.  He told me that he will need to cancel due to a recent heart attack.  He says his Cardiologist says he will need to wait at least 6 mths before having this procedure. I advised patient that we will notate his records.  Yen Wandell E., LPN.

## 2017-07-31 NOTE — Pre-Procedure Instructions (Signed)
Ryan [name]  07/31/2017      [facility] [address], [place] - [address] [place] [address] Phone: [phone] Fax: [phone]  [facility] - [place], [place] - [address] [place] [address] Phone: [phone] Fax: [phone]  [facility] [address], [place] - [place] [address] [place] [address] Phone: [phone] Fax: [phone]  [id number] - [place], [place] - [phone] [place] [address] Phone: [phone] Fax: [phone]  [facility] [address] [place] - [address] [place] [address] Phone: [phone] Fax: [phone]    Your procedure is scheduled on August 31  Report to [facility] Admitting at 0530 A.M.  Call this number if you have problems the morning of surgery:  [phone]   Remember:  Do not eat food or drink liquids after midnight.  Continue all other medications as directed by your physician except follow these instructions about you medications   Take these medicines the morning of surgery with A SIP OF WATER  carvedilol (COREG)  isosorbide mononitrate (IMDUR) oxyCODONE-acetaminophen (PERCOCET/ROXICET) if needed   7 days prior to surgery STOP taking any Aleve, Naproxen, Ibuprofen, Motrin, Advil, Goody's, BC's, all herbal medications, fish oil, and all vitamins  Follow your doctors instructions regarding your Aspirin.  If no instructions were given by the doctor you will need to call the office to get instructions.  Your pre admission RN will also call for those instructions  Follow Physicians instructions about Plavix   WHAT DO I DO ABOUT MY DIABETES MEDICATION?   Marland Kitchen Do not take oral diabetes medicines (pills) the morning of surgery.  . THE NIGHT BEFORE SURGERY, take ______45_____ units of __Insulin Glargine (LANTUS SOLOSTAR)_________insulin.   How to Manage Your Diabetes Before  and After Surgery  Why is it important to control my blood sugar before and after surgery? . Improving blood sugar levels before and after surgery helps healing and can limit problems. . A way of improving blood sugar control is eating a healthy diet by: o  Eating less sugar and carbohydrates o  Increasing activity/exercise o  Talking with your doctor about reaching your blood sugar goals . High blood sugars (greater than 180 mg/dL) can raise your risk of infections and slow your recovery, so you will need to focus on controlling your diabetes during the weeks before surgery. . Make sure that the doctor who takes care of your diabetes knows about your planned surgery including the date and location.  How do I manage my blood sugar before surgery? . Check your blood sugar at least 4 times a day, starting 2 days before surgery, to make sure that the level is not too high or low. o Check your blood sugar the morning of your surgery when you wake up and every 2 hours until you get to the Short Stay unit. . If your blood sugar is less than 70 mg/dL, you will need to treat for low blood sugar: o Do not take insulin. o Treat a low blood sugar (less than 70 mg/dL) with  cup of clear juice (cranberry or apple), 4 glucose tablets, OR glucose gel. o Recheck blood sugar in 15 minutes after treatment (to make sure it is greater than 70 mg/dL). If your blood sugar is not greater than 70 mg/dL on recheck, call [phone] for  further instructions. . Report your blood sugar to the short stay nurse when you get to Short Stay.  . If you are admitted to the hospital after surgery: o Your blood sugar will be checked by the staff and you will probably be given insulin after surgery (instead of oral diabetes medicines) to make sure you have good blood sugar levels. o The goal for blood sugar control after surgery is 80-180 mg/d          Do not wear jewelry  Do not wear lotions, powders, or perfumes, or  deoderant.  Men may shave face and neck.  Do not bring valuables to the hospital.  [facility] is not responsible for any belongings or valuables.  Contacts, dentures or bridgework may not be worn into surgery.  Leave your suitcase in the car.  After surgery it may be brought to your room.  For patients admitted to the hospital, discharge time will be determined by your treatment team.  Patients discharged the day of surgery will not be allowed to drive home.    Special instructions:   [facility]- Preparing For Surgery  Before surgery, you can play an important role. Because skin is not sterile, your skin needs to be as free of germs as possible. You can reduce the number of germs on your skin by washing with CHG (chlorahexidine gluconate) Soap before surgery.  CHG is an antiseptic cleaner which kills germs and bonds with the skin to continue killing germs even after washing.  Please do not use if you have an allergy to CHG or antibacterial soaps. If your skin becomes reddened/irritated stop using the CHG.  Do not shave (including legs and underarms) for at least 48 hours prior to first CHG shower. It is OK to shave your face.  Please follow these instructions carefully.   1. Shower the NIGHT BEFORE SURGERY and the MORNING OF SURGERY with CHG.   2. If you chose to wash your hair, wash your hair first as usual with your normal shampoo.  3. After you shampoo, rinse your hair and body thoroughly to remove the shampoo.  4. Use CHG as you would any other liquid soap. You can apply CHG directly to the skin and wash gently with a scrungie or a clean washcloth.   5. Apply the CHG Soap to your body ONLY FROM THE NECK DOWN.  Do not use on open wounds or open sores. Avoid contact with your eyes, ears, mouth and genitals (private parts). Wash genitals (private parts) with your normal soap.  6. Wash thoroughly, paying special attention to the area where your surgery will be  performed.  7. Thoroughly rinse your body with warm water from the neck down.  8. DO NOT shower/wash with your normal soap after using and rinsing off the CHG Soap.  9. Pat yourself dry with a CLEAN TOWEL.   10. Wear CLEAN PAJAMAS   11. Place CLEAN SHEETS on your bed the night of your first shower and DO NOT SLEEP WITH PETS.    Day of Surgery: Do not apply any deodorants/lotions. Please wear clean clothes to the hospital/surgery center.       Please read over the following fact sheets that you were given.

## 2017-08-01 ENCOUNTER — Inpatient Hospital Stay (HOSPITAL_COMMUNITY)
Admission: RE | Admit: 2017-08-01 | Discharge: 2017-08-01 | Disposition: A | Discharge status: Home / Self Care | Payer: Medicaid Other | Source: Ambulatory Visit

## 2017-08-03 ENCOUNTER — Encounter (HOSPITAL_COMMUNITY): Admission: RE | Payer: Self-pay | Source: Ambulatory Visit

## 2017-08-03 ENCOUNTER — Inpatient Hospital Stay (HOSPITAL_COMMUNITY): Admission: RE | Admit: 2017-08-03 | Payer: Medicaid Other | Source: Ambulatory Visit | Admitting: Surgery

## 2017-08-03 SURGERY — BYPASS GRAFT FEMORAL-POPLITEAL ARTERY
Anesthesia: General | Site: Leg Upper | Laterality: Right

## 2017-08-20 ENCOUNTER — Ambulatory Visit: Payer: Self-pay | Admitting: Physician Assistant

## 2017-08-20 NOTE — Progress Notes (Deleted)
Cardiology Office Note    Date:  08/20/2017   ID:  Ryan [name], DOB May 04, 1966, MRN [id number]  PCP:  Nolene Ebbs, MD  Cardiologist: Dr. Angelena Form  No chief complaint on file.   History of Present Illness:  Ryan [name] is a 51 y.o. male with history of CAD status post anterior STEMI 12/2011 with cardiac arrest treated with DES to the mid LAD, third OM chronically occluded and RCA had mild disease. EF initially 35% improved to normal. Severe restenosis of the LAD stent in 2017 treated with cutting balloon angioplasty. He did not keep his follow-up appointment following his PCI. He last saw Dr.McAlhany for cardiac clearance 07/26/17  to have right femoral to above-the-knee bypass . He says he wasn't having any symptoms and he was cleared for surgery.  Patient also continues to smoke, has HLD and PAD with previous aorto bifemoral bypass 01/2013 and left carotid subclavian bypass 04/2013.  Patient was admitted to [facility] 07/31/17 with a STEMI secondary to acute occlusion of the proximal LAD, proximal to his old stents treated with DES to the proximal LAD overlapping distally to prior LAD stents, successful POBA to  ostial diagonal through the stent strut  Past Medical History:  Diagnosis Date  . Anal fissure   . Bruises easily    d/t being on Effient  . Coronary atherosclerosis of native coronary artery    a. ant STEMI with cardiac arrest 2013 s/p DES to mLAD. b. [place] 10/2016 s/p PTCA to mLAD.  . Diabetes mellitus without complication ([place])   . GERD (gastroesophageal reflux disease)    "takes tums"  . History of blood transfusion    no abnormal reaction noted  . History of kidney stones   . Hypertension   . Ischemic cardiomyopathy    a. EF 35% in 02/2012 at time of acute MI, improved to normal on subsequent imaging.  . MI (myocardial infarction) ([place])    AMI 8/58 - complicated by VT/Tosades  . Mixed hyperlipidemia   . PAD (peripheral artery disease) ([place])    followed by  Vascular - aortobifem bypass 01/2013 & left carotid-subclavian artery bypass 04/2013  . Rectal polyp     Past Surgical History:  Procedure Laterality Date  . ABDOMINAL AORTOGRAM W/LOWER EXTREMITY N/A 06/26/2017   Procedure: Abdominal Aortogram w/Lower Extremity;  Surgeon: Serafina Mitchell, MD;  Location: [place] CV LAB;  Service: Cardiovascular;  Laterality: N/A;  . ANAL FISSURECTOMY    . AORTA - BILATERAL FEMORAL ARTERY BYPASS GRAFT N/A 01/16/2013   Procedure: AORTA BIFEMORAL BYPASS GRAFT;  Surgeon: Serafina Mitchell, MD;  Location: [place];  Service: Vascular;  Laterality: N/A;  . APPENDECTOMY    . CARDIAC CATHETERIZATION N/A 10/23/2016   Procedure: Left Heart Cath and Coronary Angiography;  Surgeon: Burnell Blanks, MD;  Location: [place] CV LAB;  Service: Cardiovascular;  Laterality: N/A;  . CARDIAC CATHETERIZATION N/A 10/23/2016   Procedure: Coronary Balloon Angioplasty;  Surgeon: Burnell Blanks, MD;  Location: [place] CV LAB;  Service: Cardiovascular;  Laterality: N/A;  . CAROTID-SUBCLAVIAN BYPASS GRAFT Left 04/03/2013   Procedure: BYPASS GRAFT CAROTID-SUBCLAVIAN;  Surgeon: Serafina Mitchell, MD;  Location: [place];  Service: Vascular;  Laterality: Left;  . CIRCUMCISION N/A 12/15/2015   Procedure: CIRCUMCISION ADULT;  Surgeon: Cleon Gustin, MD;  Location: [facility];  Service: Urology;  Laterality: N/A;  . COLONOSCOPY    . CORONARY ANGIOPLASTY     stent placed Dec 12, 2011  . DIAGNOSTIC  LAPAROSCOPY    . I&D EXTREMITY Left 04/18/2013   Procedure: debridement of left neck lymphocele;  Surgeon: Serafina Mitchell, MD;  Location: [place];  Service: Vascular;  Laterality: Left;  I and D of left neck  . I&D EXTREMITY Right 11/21/2014   Procedure: IRRIGATION AND DEBRIDEMENT RIGHT HAND;  Surgeon: Roseanne Kaufman, MD;  Location: [place];  Service: Orthopedics;  Laterality: Right;  . LEFT HEART CATHETERIZATION WITH CORONARY ANGIOGRAM N/A 12/12/2011   Procedure: LEFT HEART CATHETERIZATION  WITH CORONARY ANGIOGRAM;  Surgeon: Burnell Blanks, MD;  Location: [facility] CATH LAB;  Service: Cardiovascular;  Laterality: N/A;  . LOWER EXTREMITY ANGIOGRAM N/A 01/31/2012   Procedure: LOWER EXTREMITY ANGIOGRAM;  Surgeon: Burnell Blanks, MD;  Location: [facility] CATH LAB;  Service: Cardiovascular;  Laterality: N/A;  . PERCUTANEOUS CORONARY STENT INTERVENTION (PCI-S) Right 12/12/2011   Procedure: PERCUTANEOUS CORONARY STENT INTERVENTION (PCI-S);  Surgeon: Burnell Blanks, MD;  Location: [facility] CATH LAB;  Service: Cardiovascular;  Laterality: Right;  . REPAIR EXTENSOR TENDON Right 11/21/2014   Procedure: WITH REPAIR/RECONSTRUCTION OF EXTENSOR TENDONS AS NEEDED;  Surgeon: Roseanne Kaufman, MD;  Location: [place];  Service: Orthopedics;  Laterality: Right;    Current Medications: No outpatient prescriptions have been marked as taking for the 08/20/17 encounter (Appointment) with Imogene Burn, PA-C.     Allergies:   Hydrocodone   Social History   Social History  . Marital status: Divorced    Spouse name: N/A  . Number of children: N/A  . Years of education: N/A   Occupational History  .  Other   Social History Main Topics  . Smoking status: Current Every Day Smoker    Packs/day: 0.25    Years: 25.00    Types: Cigarettes  . Smokeless tobacco: Never Used  . Alcohol use No  . Drug use: No  . Sexual activity: Yes   Other Topics Concern  . Not on file   Social History Narrative   Disabled - no longer works        Family History:  The patient's ***family history includes COPD in his mother; Cancer in his father and maternal uncle; Diabetes in his mother.   ROS:   Please see the history of present illness.    ROS All other systems reviewed and are negative.   PHYSICAL EXAM:   VS:  There were no vitals taken for this visit.  Physical Exam  GEN: Well nourished, well developed, in no acute distress  HEENT: normal  Neck: no JVD, carotid bruits, or masses Cardiac:RRR; no  murmurs, rubs, or gallops  Respiratory:  clear to auscultation bilaterally, normal work of breathing GI: soft, nontender, nondistended, + BS Ext: without cyanosis, clubbing, or edema, Good distal pulses bilaterally MS: no deformity or atrophy  Skin: warm and dry, no rash Neuro:  Alert and Oriented x 3, Strength and sensation are intact Psych: euthymic mood, full affect  Wt Readings from Last 3 Encounters:  07/26/17 207 lb (93.9 kg)  07/23/17 205 lb (93 kg)  06/26/17 202 lb (91.6 kg)      Studies/Labs Reviewed:   EKG:  EKG is*** ordered today.  The ekg ordered today demonstrates ***  Recent Labs: 10/11/2016: Platelets 224 06/26/2017: BUN 18; Creatinine, Ser 1.00; Hemoglobin 16.7; Potassium 4.1; Sodium 135   Lipid Panel    Component Value Date/Time   CHOL 237 (H) 12/13/2011 0607   TRIG 329 (H) 12/13/2011 0607   HDL 30 (L) 12/13/2011 0607   CHOLHDL 7.9  12/13/2011 0607   VLDL 66 (H) 12/13/2011 0607   LDLCALC 141 (H) 12/13/2011 7782    Additional studies/ records that were reviewed today include:  Mr Moates was taken to the Cath lab and cath was performed in his right radial artery with the following results: Diagnostic procedure: Left Heart Cath , Left Ventricular Injection, Selective Coronaries PCI procedure: PTCA , PTCA Additional Vessel, Stent DES Complications: No Complications. Conclusions Diagnostic Summary 1. Acute occlusion of Prox LAD, just proxomial to old stents. (STEMI) 2. Mild disease elsewhere 3. Anteroapical/apical wall hypokinesis, LVEF 40% Diagnostic Recommendations PCI to Prox LAD Interventional Summary Successful PCI / Xience Drug Eluting Stent of the proximal Left Anterior Descending Coronary Artery, overlapping distally to prior LAD stents. Successful POBA to ostial Diag thru stent strut. Interventional Recommendations Dual anti-platelet therapy with Ticagrelor and Aspirin 81 mg for at least 12 months is recommended. Angiographic Findings Cardiac  Arteries and Lesion Findings LMCA: 0%. LAD: Lesion on Prox LAD: Proximal subsection.100% stenosis 10 mm length reduced to 0%. Pre procedure TIMI 0 flow was noted. Post Procedure TIMI III flow was present. Poor run off was present. The lesion was diagnosed as High Risk (C). Devices used - Abbott 0.014" x 190cm BMW J-Tip PTCI Guidewire. Number of passes: 1. - 2.5x12 Euphora. 5 inflation(s) to a max pressure of: 14 atm. - 3.5 mm x 12 mm Xience Sierra DES Stent. 1 inflation(s) to a max pressure of: 12 atm. Lesion on 1st Diag: Ostial.80% stenosis 4 mm length reduced to 0%. Pre procedure TIMI 0 flow was noted. Post Procedure TIMI III flow was present. Poor run off was present. The lesion was diagnosed as High Risk (C). Bifurcation lesion. Devices used - Abbott 0.014" x 190cm BMW J-Tip PTCI Guidewire. Number of passes: 1. - 2.5x12 Euphora. 2 inflation(s) to a max pressure of: 6 atm. LCx: 0%. RCA: 0%. Procedure Data Procedure Date Date: 07/31/2017 Start: 12:40 Entry Locations - Retrograde Percutaneous access was performed through the Right Radial artery. A 6 Fr sheath was inserted. Diagnostic Catheters - 5 Fr5F JR 4.0 IMPULSE Diagnostic Catheter was used for Right coronary angiography. - 5 Fr5F PIG IMPULSE Diagnostic Catheter was used for Left ventriculography. Contrast Material - Omnipaque175 ml Fluoroscopy Time: PCI: 8:07 minutes. Total: 8:07 minutes. Coronary Tree Dominance: Right VA LV function assessed UM:PNTIRWER. Ejection Fraction - Method: LV gram. EF%: 40.  Post procedure he was transferred to the Coronary Care Unit and denied any further complaints of chest pain. He was counseled to stop smoking. Medication adjustments were made throughout his hospitalization. His right wrist remained stable. He ambulated in the halls without any complaints and will be discharged home today. He is to follow up with his Cardiologist- Dr Angelena Form in [place] in one week.   Coronary Artery  Disease Management Module:   1. Coronary artery disease. The patient presented with STEMI.. The patient was treated with Drug eluting stent. LV systolic function was moderately reduced, with EF 40%.   Aspirin The patient will be treated with aspirin 81 mg daily  Dual Anti-Platelet Therapy (P2Y12) In patients with MI or coronary stenting, dual anti-platelet therapy with P2Y12 ihibition in addition to aspirin has been proven to reduce recurrent events and improve mortality. The patient has been started on Brilinta (ticagrelor) 90 mg twice a day.  Statin For lipid lowering and CVD risk reduction, the patient will be treated with Lipitor (atorvastatin)  Beta Blocker For CVD risk reduction, the patient will be treated with carvedilol..   ACE/ARB  For CVD risk reduction, the patient will be treated with Lisinopril (Zestril/Prinivil).   Smoking The patient is a current smoker. I spoke with the patientt regarding the importance of smoking cessation. I explained the benefits, including immediate reduction in myocardial infarction risk as well as a significant increase in duration of patency for any coronary or vascular intervention. I explained the increased risk of smoking, including MI, Stroke, PAD with limb loss, cancer, respiratory failure, and early death     ASSESSMENT:    No diagnosis found.   PLAN:  In order of problems listed above:      Medication Adjustments/Labs and Tests Ordered: Current medicines are reviewed at length with the patient today.  Concerns regarding medicines are outlined above.  Medication changes, Labs and Tests ordered today are listed in the Patient Instructions below. There are no Patient Instructions on file for this visit.   Sumner Boast, PA-C  08/20/2017 10:07 AM    [facility] [address], [place], [place]  [address] Phone: [phone]; Fax: [phone]

## 2017-08-21 ENCOUNTER — Ambulatory Visit: Payer: Self-pay | Admitting: Cardiology

## 2017-09-05 ENCOUNTER — Encounter: Payer: Self-pay | Admitting: Cardiology

## 2017-09-05 ENCOUNTER — Ambulatory Visit (INDEPENDENT_AMBULATORY_CARE_PROVIDER_SITE_OTHER): Payer: Medicaid Other | Admitting: Cardiology

## 2017-09-05 VITALS — BP 98/60 | HR 82 | Ht 69.0 in | Wt 199.0 lb

## 2017-09-05 DIAGNOSIS — I2510 Atherosclerotic heart disease of native coronary artery without angina pectoris: Secondary | ICD-10-CM

## 2017-09-05 DIAGNOSIS — I10 Essential (primary) hypertension: Secondary | ICD-10-CM

## 2017-09-05 DIAGNOSIS — E782 Mixed hyperlipidemia: Secondary | ICD-10-CM | POA: Diagnosis not present

## 2017-09-05 DIAGNOSIS — Z9861 Coronary angioplasty status: Secondary | ICD-10-CM | POA: Diagnosis not present

## 2017-09-05 NOTE — Progress Notes (Signed)
09/05/2017 [name]   1966-03-13  [id number]  Primary Physician Fleet Contras, MD Primary Cardiologist: Dr. [name]   Reason for Visit/CC: [facility] F/u, S/p Acute Anterior Wall STEMI  HPI:  Ryan [name] is a 51 y.o. male who is being seen today post hospital f/u after recently being admitted by [facility] for management of ACS.   He has been followed here by Dr. [name]. He had an anterior STEMI in January 2013 with cardiac arrest and found to have an occluded mid LAD treated with a drug eluting stent. The third OM branch was chronically occluded and the RCA had mild disease. His post MI LVEF was 35% but this improved and last echo in March 2013 with normal LV systolic function. Cardiac cath in November 2017 with severe stent restenosis in the LAD, treated with a cutting balloon angioplasty.  Most recently, he had anterior wall STEMI, treated at [facility]) 07/31/17. Per records, he presented with complaint of chest pain with radiation to right arm, c/w previous MI pain. EKG showed STEMI anterior wall. Code STEMI was activated.  He was take to the cath lab. Procedure was performed by Dr. Rhona Leavens at [facility]. He was found to have acute occlusion of the prox LAD, just proximal to old stents. This was sucessfully treated with PCI/ Xience Drug Eluting stent. He also underwent POBA to the ostial diag thru stent strut. EF was 40%. Troponin peaked at 10. He was placed on DAPT w/ ASA + Brilinta. LDL was elevated at 135. Simvastatin discontinued and Lipitor 80 added.    He also has significant PVD, followed by Dr. Myra Gianotti. In 2013 he was found to have occlusion of his distal aorta and left subclavian artery. He underwent aortobifemoral bypass in February 2014 and left carotid to subclavian bypass in May 2014. He has had worsened right leg claudication and found to have a 75% stenosis in the distal limb of the graft treated with balloon angioplasty but very little  change in symptoms. He was initially scheduled to undergo right fem/pop bypass per Dr. Myra Gianotti, 08/03/17, however given his recent STEMI, this was canceled.   He presents to clinic today for post hospital f/u. He denies any recurrent CP and no dyspnea. His right radial cath site is stable. He reports full med compliance. BP is soft but stable and denies dizziness and fatigue. Unfortunately, he continues to smoke but notes that he has cut back. His main concern is his leg and he is eager to have his surgery. He noted claudication only with activity. No resting leg pain.    Current Meds  Medication Sig  . aspirin EC 81 MG tablet Take 1 tablet (81 mg total) by mouth daily.  Marland Kitchen atorvastatin (LIPITOR) 80 MG tablet Take 80 mg by mouth.  . calcium carbonate (TUMS - DOSED IN MG ELEMENTAL CALCIUM) 500 MG chewable tablet Chew 1 tablet by mouth daily as needed for indigestion or heartburn.  . carvedilol (COREG) 6.25 MG tablet Take 1 tablet (6.25 mg total) by mouth 2 (two) times daily.  . enalapril (VASOTEC) 5 MG tablet TAKE ONE TABLET BY MOUTH TWICE DAILY  . Insulin Glargine (LANTUS SOLOSTAR) 100 UNIT/ML Solostar Pen Inject 65 Units into the skin at bedtime.   . isosorbide mononitrate (IMDUR) 30 MG 24 hr tablet Take 1 tablet (30 mg total) by mouth daily.  Marland Kitchen lisinopril (PRINIVIL,ZESTRIL) 2.5 MG tablet Take 2.5 mg by mouth.  . nitroGLYCERIN (NITROSTAT) 0.4 MG SL tablet  Place 0.4 mg under the tongue.  . pantoprazole (PROTONIX) 40 MG tablet Take 40 mg by mouth.  . simvastatin (ZOCOR) 40 MG tablet Take 40 mg by mouth at bedtime.  Marland Kitchen spironolactone (ALDACTONE) 25 MG tablet Take 0.5 tablets (12.5 mg total) by mouth daily.  . ticagrelor (BRILINTA) 90 MG TABS tablet Take 90 mg by mouth.  . traZODone (DESYREL) 100 MG tablet Take 100 mg by mouth at bedtime as needed for sleep.   Allergies  Allergen Reactions  . Hydrocodone Nausea And Vomiting and Hives   Past Medical History:  Diagnosis Date  . Anal fissure   .  Bruises easily    d/t being on Effient  . Coronary atherosclerosis of native coronary artery    a. ant STEMI with cardiac arrest 2013 s/p DES to mLAD. b. [place] 10/2016 s/p PTCA to mLAD.  . Diabetes mellitus without complication ([facility])   . GERD (gastroesophageal reflux disease)    "takes tums"  . History of blood transfusion    no abnormal reaction noted  . History of kidney stones   . Hypertension   . Ischemic cardiomyopathy    a. EF 35% in 02/2012 at time of acute MI, improved to normal on subsequent imaging.  . MI (myocardial infarction) ([facility])    AMI 1/13 - complicated by VT/Tosades  . Mixed hyperlipidemia   . PAD (peripheral artery disease) ([facility])    followed by Vascular - aortobifem bypass 01/2013 & left carotid-subclavian artery bypass 04/2013  . Rectal polyp    Family History  Problem Relation Age of Onset  . Cancer Father        Lung  . Diabetes Mother   . COPD Mother   . Cancer Maternal Uncle        Colon   Past Surgical History:  Procedure Laterality Date  . ABDOMINAL AORTOGRAM W/LOWER EXTREMITY N/A 06/26/2017   Procedure: Abdominal Aortogram w/Lower Extremity;  Surgeon: Nada Libman, MD;  [facility]: [facility] INV[facility]IVE CV LAB;  Service: Cardiovascular;  Laterality: N/A;  . ANAL FISSURECTOMY    . AORTA - BILATERAL FEMORAL ARTERY BYPASS GRAFT N/A 01/16/2013   Procedure: AORTA BIFEMORAL BYPASS GRAFT;  Surgeon: Nada Libman, MD;  Location: [facility] OR;  Service: Vascular;  Laterality: N/A;  . APPENDECTOMY    . CARDIAC CATHETERIZATION N/A 10/23/2016   Procedure: Left Heart Cath and Coronary Angiography;  Surgeon: Kathleene Hazel, MD;  Location: [facility] INVASIVE CV LAB;  Service: Cardiovascular;  Laterality: N/A;  . CARDIAC CATHETERIZATION N/A 10/23/2016   Procedure: Coronary Balloon Angioplasty;  Surgeon: Kathleene Hazel, MD;  Location: [facility] INVASIVE CV LAB;  Service: Cardiovascular;  Laterality: N/A;  . CAROTID-SUBCLAVIAN BYPASS GRAFT Left 04/03/2013   Procedure: BYPASS GRAFT  CAROTID-SUBCLAVIAN;  Surgeon: Nada Libman, MD;  Location: [facility] OR;  Service: Vascular;  Laterality: Left;  . CIRCUMCISION N/A 12/15/2015   Procedure: CIRCUMCISION ADULT;  Surgeon: Malen Gauze, MD;  Location: [facility];  Service: Urology;  Laterality: N/A;  . COLONOSCOPY    . CORONARY ANGIOPLASTY     stent placed Dec 12, 2011  . DIAGNOSTIC LAPAROSCOPY    . I&D EXTREMITY Left 04/18/2013   Procedure: debridement of left neck lymphocele;  Surgeon: Nada Libman, MD;  Location[facility];  Service: Vascular;  Laterality: Left;  I and D of left neck  . I&D EXTREMITY Right 11/21/2014   Procedure: IRRIGATION AND DEBRIDEMENT RIGHT HAND;  Surgeon: Dominica Severin, MD;  Location: [facility] OR;  Service: Orthopedics;  Laterality: Right;  . LEFT HEART CATHETERIZATION WITH CORONARY ANGIOGRAM N/A 12/12/2011   Procedure: LEFT HEART CATHETERIZATION WITH CORONARY ANGIOGRAM;  Surgeon: Kathleene Hazel, MD;  Location: [facility] CATH LAB;  Service: Cardiovascular;  Laterality: N/A;  . LOWER EXTREMITY ANGIOGRAM N/A 01/31/2012   Procedure: LOWER EXTREMITY ANGIOGRAM;  Surgeon: Kathleene Hazel, MD;  Location: [facility] CATH LAB;  Service: Cardiovascular;  Laterality: N/A;  . PERCUTANEOUS CORONARY STENT INTERVENTION (PCI-S) Right 12/12/2011   Procedure: PERCUTANEOUS CORONARY STENT INTERVENTION (PCI-S);  Surgeon: Kathleene Hazel, MD;  Location: [facility] CATH LAB;  Service: Cardiovascular;  Laterality: Right;  . REPAIR EXTENSOR TENDON Right 11/21/2014   Procedure: WITH REPAIR/RECONSTRUCTION OF EXTENSOR TENDONS AS NEEDED;  Surgeon: Dominica Severin, MD;  Location: [facility] OR;  Service: Orthopedics;  Laterality: Right;   Social History   Social History  . Marital status: Divorced    Spouse name: N/A  . Number of children: N/A  . Years of education: N/A   Occupational History  .  Other   Social History Main Topics  . Smoking status: Current Every Day Smoker    Packs/day: 0.25    Years: 25.00    Types: Cigarettes  . Smokeless  tobacco: Never Used  . Alcohol use No  . Drug use: No  . Sexual activity: Yes   Other Topics Concern  . Not on file   Social History Narrative   Disabled - no longer works        Review of Systems: General: negative for chills, fever, night sweats or weight changes.  Cardiovascular: negative for chest pain, dyspnea on exertion, edema, orthopnea, palpitations, paroxysmal nocturnal dyspnea or shortness of breath Dermatological: negative for rash Respiratory: negative for cough or wheezing Urologic: negative for hematuria Abdominal: negative for nausea, vomiting, diarrhea, bright red blood per rectum, melena, or hematemesis Neurologic: negative for visual changes, syncope, or dizziness All other systems reviewed and are otherwise negative except as noted above.   Physical Exam:  Blood pressure 98/60, pulse 82, height  (1.753 m), weight 199 lb (90.3 kg), SpO2 97 %.  General appearance: alert, cooperative and no distress Neck: no carotid bruit and no JVD Lungs: clear to auscultation bilaterally Heart: regular rate and rhythm, S1, S2 normal, no murmur, click, rub or gallop Extremities: extremities normal, atraumatic, no cyanosis or edema Pulses: decreased distal pulses Skin: Skin color, texture, turgor normal. No rashes or lesions Neurologic: Grossly normal  EKG NSR with anterior TW abnormality -- personally reviewed    Recent Emergent LHC 07/31/17 [facility])  Conclusions Diagnostic Summary 1. Acute occlusion of Prox LAD, just proxomial to old stents. (STEMI) 2. Mild disease elsewhere 3. Anteroapical/apical wall hypokinesis, LVEF 40% Diagnostic Recommendations PCI to Prox LAD Interventional Summary Successful PCI / Xience Drug Eluting Stent of the proximal Left Anterior Descending Coronary Artery, overlapping distally to prior LAD stents. Successful POBA to ostial Diag thru stent strut. Interventional Recommendations Dual anti-platelet therapy with  Ticagrelor and Aspirin 81 mg for at least 12 months is recommended.      ASSESSMENT AND PLAN:   1. CAD: recent anterior MI treated at [facility] w/ PCI + DES to proximal LAD (see cath report above), EF 40%. He is stable w/o recurrent CP. Continue DAPT w/ ASA and Brilinta. Duration of DAPT to be determined by primary cardiologist. Preferably DAPT would need to be continued x 12 months, however pt with significant LE PVD needing right fem/pop bypass. Will defer to Dr. [name] if DAPT can be held  after 3-6 months to allow for surgery. Continue statin and BB. Smoking cessation advised.   2. Systolic HF: EF post MI 40%. Pt is on BB, ACE-I, spironolactone and long acting nitrate. BP is soft but stable. No room in BP for further titration of meds at this time. He is euvolemic. No dyspnea. Continue medical therapy.   3. PVD: pt was scheduled for right fem/pop bypass w/ Dr. Myra Gianotti on 08/03/17, however this was canceled given recent MI. Pt informed that he will need to continue to delay surgery until he has completed recommended therapy w/ DAPT given recent DES. Will defer timing to Dr. [name].   4. Tobacco Abuse: pt continues to smoke. Smoking cessation strongly advised.  5. HLD: recent LDL elevated at 135. Simvastatin has been discontinued and he is now on high dose Lipitor, 80 mg nightly. We will recheck FLP and HFTs in 8 weeks. Goal LDL is <70 mg/dL. If not at goal in 8 weeks, we will add Zetia.   Follow-Up w/ Dr. [name] in 2-3 months.   [place] Monds Delmer Islam, MHS [facility] 09/05/2017 12:38 PM

## 2017-09-05 NOTE — Patient Instructions (Addendum)
Medication Instructions:  Your physician recommends that you continue on your current medications as directed. Please refer to the Current Medication list given to you today.  Labwork: Your physician recommends that you return for lab work in: 8 weeks for fasting lipid and liver panel.  Testing/Procedures: NONE  Follow-Up: We will call you with a follow up appointment after Dr. [name] has reviewed notes from your visit today.  If you need a refill on your cardiac medications before your next appointment, please call your pharmacy.

## 2017-09-06 ENCOUNTER — Telehealth: Payer: Self-pay | Admitting: *Deleted

## 2017-09-06 NOTE — Telephone Encounter (Signed)
Per Dr. [name] pt will need to wait 6 months post STEMI to have PV surgery. Pt needs office visit in 2 months with Dr. [name] or APP.  I placed call to pt to schedule this appointment.  Left message to call back.

## 2017-09-07 NOTE — Telephone Encounter (Signed)
New Message ° ° pt verbalized that he is returning call for rn  °

## 2017-09-07 NOTE — Telephone Encounter (Signed)
Called patient back and informed patient, per Dr. [name], he would need to wait 6 months post STEMI to have PV surgery. Informed patient that his nurse will call him back next week to schedule him for an appointment. Patient verbalized understanding.

## 2017-09-11 NOTE — Telephone Encounter (Signed)
I spoke with Ryan [name] and scheduled him to see Dr. [name] on 11/09/17 at 10:40

## 2017-09-25 ENCOUNTER — Encounter (HOSPITAL_COMMUNITY): Payer: Self-pay | Admitting: *Deleted

## 2017-10-31 ENCOUNTER — Other Ambulatory Visit: Payer: Medicaid Other

## 2017-11-01 ENCOUNTER — Ambulatory Visit: Payer: Medicaid Other | Admitting: Cardiovascular Disease

## 2017-11-01 ENCOUNTER — Other Ambulatory Visit: Payer: Self-pay | Admitting: Cardiovascular Disease

## 2017-11-09 ENCOUNTER — Ambulatory Visit: Payer: Medicaid Other | Admitting: Cardiovascular Disease

## 2017-11-09 ENCOUNTER — Encounter: Payer: Self-pay | Admitting: Cardiovascular Disease

## 2017-11-09 VITALS — BP 90/68 | HR 80 | Ht 69.0 in | Wt 206.6 lb

## 2017-11-09 DIAGNOSIS — E7849 Other hyperlipidemia: Secondary | ICD-10-CM

## 2017-11-09 DIAGNOSIS — Z720 Tobacco use: Secondary | ICD-10-CM

## 2017-11-09 DIAGNOSIS — I739 Peripheral vascular disease, unspecified: Secondary | ICD-10-CM

## 2017-11-09 DIAGNOSIS — I25812 Atherosclerosis of bypass graft of coronary artery of transplanted heart without angina pectoris: Secondary | ICD-10-CM

## 2017-11-09 LAB — HEPATIC FUNCTION PANEL
ALT: 27 IU/L (ref 0–44)
AST: 19 IU/L (ref 0–40)
Albumin: 4.5 g/dL (ref 3.5–5.5)
Alkaline Phosphatase: 100 IU/L (ref 39–117)
BILIRUBIN TOTAL: 0.4 mg/dL (ref 0.0–1.2)
BILIRUBIN, DIRECT: 0.1 mg/dL (ref 0.00–0.40)
Total Protein: 7 g/dL (ref 6.0–8.5)

## 2017-11-09 LAB — LIPID PANEL
CHOL/HDL RATIO: 5.8 ratio — AB (ref 0.0–5.0)
CHOLESTEROL TOTAL: 180 mg/dL (ref 100–199)
HDL: 31 mg/dL — AB (ref >39)
LDL Calculated: 89 mg/dL (ref 0–99)
TRIGLYCERIDES: 299 mg/dL — AB (ref 0–149)
VLDL Cholesterol Cal: 60 mg/dL — ABNORMAL HIGH (ref 5–40)

## 2017-11-09 NOTE — Progress Notes (Signed)
Chief Complaint  Patient presents with  . Follow-up    CAD    History of Present Illness: 51 yo male with history of ongoing tobacco abuse, CAD, HLD, PAD who is here today for cardiac follow up. He had an anterior STEMI in January 2013 with cardiac arrest and found to have an occluded mid LAD treated with a drug eluting stent. The third OM branch was chronically occluded and the RCA had mild disease. His post MI LVEF was 35% but this was found to be normal on follow up echo. Cardiac cath in November 2017 with severe stent restenosis in the LAD, treated with a cutting balloon angioplasty. He did not keep his f/u appt in our office following his PCI. He has been non-compliant with follow up, medications and continues to smoke. Admitted to [facility] in August 2018 with an anterior STEMI. His LAD was occluded proximal to the old stent and this was treated with a drug eluting stent. Angioplasty performed on the ostial Diagonal branch. LVEF was 40% following the MI.   His PAD is followed in [facility] by Dr. Myra Gianotti. In 2013 he was found to have occlusion of his distal aorta and left subclavian artery. He underwent aortobifemoral bypass in February 2014 and left carotid to subclavian bypass in May 2014. He has had worsened right leg claudication and found to have a 75% stenosis in the distal limb of the graft treated with balloon angioplasty but very little change in symptoms. Dr. Myra Gianotti is planning right femoral to above knee bypass but this has been delayed due to his STEMI in August 2018.    He is here today for follow up. The patient denies any chest pain, dyspnea, palpitations, lower extremity edema, orthopnea, PND, dizziness, near syncope or syncope. He is feeling well. He is hoping to stop his Brilinta soon so he can plan his lower extremity bypass. Still smoking 1/2 ppd.     Primary Care Physician: Fleet Contras, MD   Past Medical History:  Diagnosis Date  . Anal fissure   .  Bruises easily    d/t being on Effient  . Coronary atherosclerosis of native coronary artery    a. ant STEMI with cardiac arrest 2013 s/p DES to mLAD. b. [place] 10/2016 s/p PTCA to mLAD.  . Diabetes mellitus without complication (HCC)   . GERD (gastroesophageal reflux disease)    "takes tums"  . History of blood transfusion    no abnormal reaction noted  . History of kidney stones   . Hypertension   . Ischemic cardiomyopathy    a. EF 35% in 02/2012 at time of acute MI, improved to normal on subsequent imaging.  . MI (myocardial infarction) (HCC)    AMI 1/13 - complicated by VT/Tosades  . Mixed hyperlipidemia   . PAD (peripheral artery disease) (HCC)    followed by [facility] - aortobifem bypass 01/2013 & left carotid-subclavian artery bypass 04/2013  . Rectal polyp     Past Surgical History:  Procedure Laterality Date  . ABDOMINAL AORTOGRAM W/LOWER EXTREMITY N/A 06/26/2017   Procedure: Abdominal Aortogram w/Lower Extremity;  Surgeon: Nada Libman, MD;  Location: [facility] INVASIVE CV LAB;  Service: Cardiovascular;  Laterality: N/A;  . ANAL FISSURECTOMY    . AORTA - BILATERAL FEMORAL ARTERY BYPASS GRAFT N/A 01/16/2013   Procedure: AORTA BIFEMORAL BYPASS GRAFT;  Surgeon: Nada Libman, MD;  Location: [facility] OR;  Service: Vascular;  Laterality: N/A;  . APPENDECTOMY    . CARDIAC  CATHETERIZATION N/A 10/23/2016   Procedure: Left Heart Cath and Coronary Angiography;  Surgeon: Kathleene Hazel, MD;  Location: [facility] INVASIVE CV LAB;  Service: Cardiovascular;  Laterality: N/A;  . CARDIAC CATHETERIZATION N/A 10/23/2016   Procedure: Coronary Balloon Angioplasty;  Surgeon: Kathleene Hazel, MD;  Location: [facility] INVASIVE CV LAB;  Service: Cardiovascular;  Laterality: N/A;  . CAROTID-SUBCLAVIAN BYPASS GRAFT Left 04/03/2013   Procedure: BYPASS GRAFT CAROTID-SUBCLAVIAN;  Surgeon: Nada Libman, MD;  Location: [facility] OR;  Service: Vascular;  Laterality: Left;  . CIRCUMCISION N/A 12/15/2015   Procedure:  CIRCUMCISION ADULT;  Surgeon: Malen Gauze, MD;  Location: [facility] ORS;  Service: Urology;  Laterality: N/A;  . COLONOSCOPY    . CORONARY ANGIOPLASTY     stent placed Dec 12, 2011  . DIAGNOSTIC LAPAROSCOPY    . I&D EXTREMITY Left 04/18/2013   Procedure: debridement of left neck lymphocele;  Surgeon: Nada Libman, MD;  Location: [facility] OR;  Service: Vascular;  Laterality: Left;  I and D of left neck  . I&D EXTREMITY Right 11/21/2014   Procedure: IRRIGATION AND DEBRIDEMENT RIGHT HAND;  Surgeon: Dominica Severin, MD;  Location: [facility] OR;  Service: Orthopedics;  Laterality: Right;  . LEFT HEART CATHETERIZATION WITH CORONARY ANGIOGRAM N/A 12/12/2011   Procedure: LEFT HEART CATHETERIZATION WITH CORONARY ANGIOGRAM;  Surgeon: Kathleene Hazel, MD;  Location: [facility] CATH LAB;  Service: Cardiovascular;  Laterality: N/A;  . LOWER EXTREMITY ANGIOGRAM N/A 01/31/2012   Procedure: LOWER EXTREMITY ANGIOGRAM;  Surgeon: Kathleene Hazel, MD;  Location: [facility] CATH LAB;  Service: Cardiovascular;  Laterality: N/A;  . PERCUTANEOUS CORONARY STENT INTERVENTION (PCI-S) Right 12/12/2011   Procedure: PERCUTANEOUS CORONARY STENT INTERVENTION (PCI-S);  Surgeon: Kathleene Hazel, MD;  Location: [facility] CATH LAB;  Service: Cardiovascular;  Laterality: Right;  . REPAIR EXTENSOR TENDON Right 11/21/2014   Procedure: WITH REPAIR/RECONSTRUCTION OF EXTENSOR TENDONS AS NEEDED;  Surgeon: Dominica Severin, MD;  Location: [facility] OR;  Service: Orthopedics;  Laterality: Right;    Current Outpatient Medications  Medication Sig Dispense Refill  . aspirin EC 81 MG tablet Take 1 tablet (81 mg total) by mouth daily. 90 tablet 3  . calcium carbonate (TUMS - DOSED IN MG ELEMENTAL CALCIUM) 500 MG chewable tablet Chew 1 tablet by mouth daily as needed for indigestion or heartburn.    . carvedilol (COREG) 6.25 MG tablet Take 1 tablet (6.25 mg total) by mouth 2 (two) times daily. 180 tablet 3  . cyclobenzaprine (FLEXERIL) 10 MG tablet Take 10 mg by mouth 3  (three) times daily as needed for muscle spasms.    . empagliflozin (JARDIANCE) 25 MG TABS tablet Take 25 mg by mouth daily.    . enalapril (VASOTEC) 5 MG tablet TAKE ONE TABLET BY MOUTH TWICE DAILY 60 tablet 2  . Insulin Glargine (LANTUS SOLOSTAR) 100 UNIT/ML Solostar Pen Inject 65 Units into the skin at bedtime.     . isosorbide mononitrate (IMDUR) 30 MG 24 hr tablet TAKE 1 TABLET BY MOUTH EVERY DAY 90 tablet 2  . nitroGLYCERIN (NITROSTAT) 0.4 MG SL tablet Place 0.4 mg under the tongue.    . pantoprazole (PROTONIX) 40 MG tablet Take 40 mg by mouth.    . simvastatin (ZOCOR) 80 MG tablet Take 80 mg by mouth at bedtime.    Marland Kitchen spironolactone (ALDACTONE) 25 MG tablet Take 0.5 tablets (12.5 mg total) by mouth daily. 45 tablet 3  . ticagrelor (BRILINTA) 90 MG TABS tablet Take 90 mg by mouth.    . traZODone (  DESYREL) 100 MG tablet Take 100 mg by mouth at bedtime as needed for sleep.     No current facility-administered medications for this visit.     Allergies  Allergen Reactions  . Hydrocodone Nausea And Vomiting and Hives    Social History   Socioeconomic History  . Marital status: Divorced    Spouse name: Not on file  . Number of children: Not on file  . Years of education: Not on file  . Highest education level: Not on file  Social Needs  . Financial resource strain: Not on file  . Food insecurity - worry: Not on file  . Food insecurity - inability: Not on file  . Transportation needs - medical: Not on file  . Transportation needs - non-medical: Not on file  Occupational History    Employer: OTHER  Tobacco Use  . Smoking status: Current Every Day Smoker    Packs/day: 0.25    Years: 25.00    Pack years: 6.25    Types: Cigarettes  . Smokeless tobacco: Never Used  Substance and Sexual Activity  . Alcohol use: No    Alcohol/week: 0.0 oz  . Drug use: No  . Sexual activity: Yes  Other Topics Concern  . Not on file  Social History Narrative   Disabled - no longer works     Family History  Problem Relation Age of Onset  . Cancer Father        Lung  . Diabetes Mother   . COPD Mother   . Cancer Maternal Uncle        Colon    Review of Systems:  As stated in the HPI and otherwise negative.   BP 90/68   Pulse 80   Ht 5\' 9"  (1.753 m)   Wt 206 lb 9.6 oz (93.7 kg)   SpO2 96%   BMI 30.51 kg/m   Physical Examination:  General: Well developed, well nourished, NAD  HEENT: OP clear, mucus membranes moist  SKIN: warm, dry. No rashes. Neuro: No focal deficits  Musculoskeletal: Muscle strength 5/5 all ext  Psychiatric: Mood and affect normal  Neck: No JVD, no carotid bruits, no thyromegaly, no lymphadenopathy.  Lungs:Clear bilaterally, no wheezes, rhonci, crackles Cardiovascular: Regular rate and rhythm. No murmurs, gallops or rubs. Abdomen:Soft. Bowel sounds present. Non-tender.  Extremities: No lower extremity edema.   EKG:  EKG is not ordered today. The ekg ordered today demonstrates   Recent Labs: 06/26/2017: BUN 18; Creatinine, Ser 1.00; Hemoglobin 16.7; Potassium 4.1; Sodium 135   Lipid Panel Followed in primary care   Wt Readings from Last 3 Encounters:  11/09/17 206 lb 9.6 oz (93.7 kg)  09/05/17 199 lb (90.3 kg)  07/26/17 207 lb (93.9 kg)     Other studies Reviewed: Additional studies/ records that were reviewed today include: . Review of the above records demonstrates:    Assessment and Plan:   1. CAD without angina: He has no chest pain suggestive of angina at this time. Anterior STEMI secondary to occluded proximal LAD in August 2018. He is on good medical therapy. He has undergone balloon angioplasty of severe mid LAD stent restenosis in November 2017. Chronic occlusion of OM3.  Will continue ASA, Brilinta, statin, Ace-inh, Imdur and beta blocker.  Will check lipid and LFTs today.   2. PAD: Followed in [facility]. He is asking when he can arrange his right leg bypass which had been planned prior to his MI in August 2018. I think it  would be  best to continue dual anti-platelet therapy with ASA and Brilinta for at least 6 months before stopping the Brilitna for the surgery. He can discuss arranging the LE bypass in mid February. I will send this message to Dr. Myra GianottiBrabham.   3. Tobacco abuse: Tobacco cessation is encouraged. He is cutting back.   4. Hyperlipidemia: Will continue statin. Check lipids and LFTs today  Current medicines are reviewed at length with the patient today.  The patient does not have concerns regarding medicines.  The following changes have been made:  no change  Labs/ tests ordered today include:   Orders Placed This Encounter  Procedures  . Lipid Profile  . Hepatic function panel    Disposition:   FU with me in 6  months  Signed, Verne Carrowhristopher Jayleene Glaeser, MD 11/09/2017 11:03 AM    [facility] [address], [place], [place]  [address] Phone: [phone]; Fax: [phone]

## 2017-11-09 NOTE — Patient Instructions (Addendum)
Medication Instructions:  Your physician has recommended you make the following change in your medication:  Stop lisinopril  Labwork:  Lab work to be done today--Lipid and Liver profiles Testing/Procedures:   None. Follow-Up:  Your physician recommends that you schedule a follow-up appointment in: 6 months.    Any Other Special Instructions Will Be Listed Below (If Applicable).     If you need a refill on your cardiac medications before your next appointment, please call your pharmacy.

## 2017-12-21 ENCOUNTER — Telehealth: Payer: Self-pay | Admitting: *Deleted

## 2017-12-21 ENCOUNTER — Other Ambulatory Visit: Payer: Self-pay | Admitting: *Deleted

## 2017-12-21 NOTE — Telephone Encounter (Signed)
-----   Message from Kathleene Hazel, MD sent at 12/21/2017 12:40 PM EST ----- Regarding: RE: cardiac clearance and Brilenta He can proceed with his planned surgery in February and can hold Brilinta 7 days before the surgery.  [name]  ----- Message ----- From: Retta Mac, RN Sent: 12/20/2017   9:51 AM To: Kathleene Hazel, MD Subject: cardiac clearance and Brilenta                 Surgery for right fem-pop is scheduled for 01/23/18 with Dr. Myra Gianotti. I have seen your notes. Is he cleared to proceed with surgery on thi date and how long can stop Brilenta  7 days pre-op? Thank you for your assistance. Becky RN

## 2017-12-21 NOTE — Telephone Encounter (Signed)
Phone call to patient to hold Brillinta for 7 days pre-op per Dr. Sanjuana Kava.  To be at [facility] Admitting at 6:30 am 01/23/2018 for surgery. NPO past MN night prior and to follow the detailed instructions he receives from the hospital pre-admission testing department about this surgery.

## 2017-12-31 ENCOUNTER — Telehealth: Payer: Self-pay | Admitting: *Deleted

## 2017-12-31 NOTE — Telephone Encounter (Signed)
PATIENT NOTIFIED OR TIME CHANGE FOR SURGERY TO BE AT HOSPITAL AT 9AM ON 01/23/18

## 2018-01-18 NOTE — Pre-Procedure Instructions (Addendum)
Samory Izer  01/18/2018      [id number] - [place], [place] - [address] [place] [address] Phone: [phone] Fax: [phone]  [facility] - [place], [place] - [address] [place] [address] Phone: [phone] Fax: [phone]    Your procedure is scheduled on Feb 20  Report to [facility] Admitting at 915 A.M.  Call this number if you have problems the morning of surgery:  [phone]   Remember:  Do not eat food or drink liquids after midnight.  Take these medicines the morning of surgery with A SIP OF WATER Carvedilol (Coreg),Cyclobenzaprine (Flexeril), isosorbide mononitrate (Imdur), Nitrostat if needed  Stop taking aspirin  And Brilinta as directed by your Dr.   Stop taking  BC's, Goody's, Herbal medications , Fish Oil, Ibuprofen, Advil, Motrin, Aleve, Vitamins    How to Manage Your Diabetes Before and After Surgery  Why is it important to control my blood sugar before and after surgery? . Improving blood sugar levels before and after surgery helps healing and can limit problems. . A way of improving blood sugar control is eating a healthy diet by: o  Eating less sugar and carbohydrates o  Increasing activity/exercise o  Talking with your doctor about reaching your blood sugar goals . High blood sugars (greater than 180 mg/dL) can raise your risk of infections and slow your recovery, so you will need to focus on controlling your diabetes during the weeks before surgery. . Make sure that the doctor who takes care of your diabetes knows about your planned surgery including the date and location.  How do I manage my blood sugar before surgery? . Check your blood sugar at least 4 times a day, starting 2 days before surgery, to make sure that the level is not too high or low. o Check your blood sugar the morning of your surgery when you wake up and every 2 hours until you get to the Short Stay  unit. . If your blood sugar is less than 70 mg/dL, you will need to treat for low blood sugar: o Do not take insulin. o Treat a low blood sugar (less than 70 mg/dL) with  cup of clear juice (cranberry or apple), 4 glucose tablets, OR glucose gel. Recheck blood sugar in 15 minutes after treatment (to make sure it is greater than 70 mg/dL). If your blood sugar is not greater than 70 mg/dL on recheck, call [phone] o  for further instructions. . Report your blood sugar to the short stay nurse when you get to Short Stay.  . If you are admitted to the hospital after surgery: o Your blood sugar will be checked by the staff and you will probably be given insulin after surgery (instead of oral diabetes medicines) to make sure you have good blood sugar levels. o The goal for blood sugar control after surgery is 80-180 mg/dL.              WHAT DO I DO ABOUT MY DIABETES MEDICATION?   Marland Kitchen Do not take oral diabetes medicines (pills) the morning of surgery.  . THE NIGHT BEFORE SURGERY, take 27 units of  Lantus insulin.       . THE MORNING OF SURGERY, take _____________ units of __________insulin.  . The day of surgery, do not take other diabetes injectables, including Byetta (exenatide), Bydureon (exenatide ER), Victoza (liraglutide), or Trulicity (dulaglutide).  . If your  CBG is greater than 220 mg/dL, you may take  of your sliding scale (correction) dose of insulin.  Other Instructions:          Patient Signature:  Date:   Nurse Signature:  Date:   Reviewed and Endorsed by [facility] Patient Education Committee, August 2015  Do not wear jewelry, make-up or nail polish.  Do not wear lotions, powders, or perfumes, or deodorant.  Do not shave 48 hours prior to surgery.  Men may shave face and neck.  Do not bring valuables to the hospital.  [facility] is not responsible for any belongings or valuables.  Contacts, dentures or bridgework may not be worn into surgery.  Leave  your suitcase in the car.  After surgery it may be brought to your room.  For patients admitted to the hospital, discharge time will be determined by your treatment team.  Patients discharged the day of surgery will not be allowed to drive home.   Special instructions:  [facility] - Preparing for Surgery  Before surgery, you can play an important role.  Because skin is not sterile, your skin needs to be as free of germs as possible.  You can reduce the number of germs on you skin by washing with CHG (chlorahexidine gluconate) soap before surgery.  CHG is an antiseptic cleaner which kills germs and bonds with the skin to continue killing germs even after washing.  Please DO NOT use if you have an allergy to CHG or antibacterial soaps.  If your skin becomes reddened/irritated stop using the CHG and inform your nurse when you arrive at Short Stay.  Do not shave (including legs and underarms) for at least 48 hours prior to the first CHG shower.  You may shave your face.  Please follow these instructions carefully:   1.  Shower with CHG Soap the night before surgery and the   morning of Surgery.  2.  If you choose to wash your hair, wash your hair first as usual with your normal shampoo.  3.  After you shampoo, rinse your hair and body thoroughly to remove the Shampoo.  4.  Use CHG as you would any other liquid soap.  You can apply chg directly  to the skin and wash gently with scrungie or a clean washcloth.  5.  Apply the CHG Soap to your body ONLY FROM THE NECK DOWN.   Do not use on open wounds or open sores.  Avoid contact with your eyes, ears, mouth and genitals (private parts).  Wash genitals (private parts)   with your normal soap.  6.  Wash thoroughly, paying special attention to the area where your surgery will be performed.  7.  Thoroughly rinse your body with warm water from the neck down.  8.  DO NOT shower/wash with your normal soap after using and rinsing off the CHG Soap.  9.  Pat  yourself dry with a clean towel.            10.  Wear clean pajamas.            11.  Place clean sheets on your bed the night of your first shower and do not sleep with pets.  Day of Surgery  Do not apply any lotions/deoderants the morning of surgery.  Please wear clean clothes to the hospital/surgery center.     Please read over the following fact sheets that you were given. Pain Booklet, Coughing and Deep Breathing, MRSA Information and Surgical  Site Infection Prevention

## 2018-01-21 ENCOUNTER — Encounter (HOSPITAL_COMMUNITY)
Admission: RE | Admit: 2018-01-21 | Discharge: 2018-01-21 | Disposition: A | Discharge status: Home / Self Care | Payer: Medicaid Other | Source: Ambulatory Visit | Attending: Surgery | Admitting: Surgery

## 2018-01-21 ENCOUNTER — Encounter (HOSPITAL_COMMUNITY): Payer: Self-pay | Admitting: Emergency Medicine

## 2018-01-21 ENCOUNTER — Encounter (HOSPITAL_COMMUNITY): Payer: Self-pay

## 2018-01-21 ENCOUNTER — Other Ambulatory Visit: Payer: Self-pay

## 2018-01-21 DIAGNOSIS — I213 ST elevation (STEMI) myocardial infarction of unspecified site: Secondary | ICD-10-CM | POA: Diagnosis not present

## 2018-01-21 DIAGNOSIS — Z794 Long term (current) use of insulin: Secondary | ICD-10-CM | POA: Diagnosis not present

## 2018-01-21 DIAGNOSIS — E119 Type 2 diabetes mellitus without complications: Secondary | ICD-10-CM | POA: Diagnosis not present

## 2018-01-21 DIAGNOSIS — I255 Ischemic cardiomyopathy: Secondary | ICD-10-CM | POA: Diagnosis not present

## 2018-01-21 DIAGNOSIS — Z8674 Personal history of sudden cardiac arrest: Secondary | ICD-10-CM | POA: Insufficient documentation

## 2018-01-21 DIAGNOSIS — Z9889 Other specified postprocedural states: Secondary | ICD-10-CM | POA: Insufficient documentation

## 2018-01-21 DIAGNOSIS — Z79899 Other long term (current) drug therapy: Secondary | ICD-10-CM | POA: Diagnosis not present

## 2018-01-21 DIAGNOSIS — I2510 Atherosclerotic heart disease of native coronary artery without angina pectoris: Secondary | ICD-10-CM | POA: Insufficient documentation

## 2018-01-21 DIAGNOSIS — Z7982 Long term (current) use of aspirin: Secondary | ICD-10-CM | POA: Insufficient documentation

## 2018-01-21 DIAGNOSIS — F17200 Nicotine dependence, unspecified, uncomplicated: Secondary | ICD-10-CM | POA: Insufficient documentation

## 2018-01-21 DIAGNOSIS — Z01818 Encounter for other preprocedural examination: Secondary | ICD-10-CM | POA: Insufficient documentation

## 2018-01-21 DIAGNOSIS — E785 Hyperlipidemia, unspecified: Secondary | ICD-10-CM | POA: Diagnosis not present

## 2018-01-21 DIAGNOSIS — I10 Essential (primary) hypertension: Secondary | ICD-10-CM | POA: Diagnosis not present

## 2018-01-21 DIAGNOSIS — Z955 Presence of coronary angioplasty implant and graft: Secondary | ICD-10-CM | POA: Insufficient documentation

## 2018-01-21 LAB — URINALYSIS, ROUTINE W REFLEX MICROSCOPIC
BILIRUBIN URINE: NEGATIVE
GLUCOSE, UA: NEGATIVE mg/dL
HGB URINE DIPSTICK: NEGATIVE
Ketones, ur: NEGATIVE mg/dL
Leukocytes, UA: NEGATIVE
Nitrite: NEGATIVE
Protein, ur: NEGATIVE mg/dL
SPECIFIC GRAVITY, URINE: 1.004 — AB (ref 1.005–1.030)
pH: 7 (ref 5.0–8.0)

## 2018-01-21 LAB — TYPE AND SCREEN
ABO/RH(D): O POS
ANTIBODY SCREEN: NEGATIVE

## 2018-01-21 LAB — CBC
HCT: 46.4 % (ref 39.0–52.0)
Hemoglobin: 15.8 g/dL (ref 13.0–17.0)
MCH: 31.7 pg (ref 26.0–34.0)
MCHC: 34.1 g/dL (ref 30.0–36.0)
MCV: 93 fL (ref 78.0–100.0)
Platelets: 252 10*3/uL (ref 150–400)
RBC: 4.99 MIL/uL (ref 4.22–5.81)
RDW: 13.3 % (ref 11.5–15.5)
WBC: 10.7 10*3/uL — ABNORMAL HIGH (ref 4.0–10.5)

## 2018-01-21 LAB — COMPREHENSIVE METABOLIC PANEL
ALBUMIN: 3.8 g/dL (ref 3.5–5.0)
ALT: 50 U/L (ref 17–63)
ANION GAP: 11 (ref 5–15)
AST: 33 U/L (ref 15–41)
Alkaline Phosphatase: 92 U/L (ref 38–126)
BUN: 9 mg/dL (ref 6–20)
CO2: 27 mmol/L (ref 22–32)
Calcium: 9.3 mg/dL (ref 8.9–10.3)
Chloride: 98 mmol/L — ABNORMAL LOW (ref 101–111)
Creatinine, Ser: 1.19 mg/dL (ref 0.61–1.24)
GFR calc Af Amer: >60 mL/min (ref >60)
GFR calc non Af Amer: >60 mL/min (ref >60)
GLUCOSE: 164 mg/dL — AB (ref 65–99)
POTASSIUM: 4.3 mmol/L (ref 3.5–5.1)
SODIUM: 136 mmol/L (ref 135–145)
TOTAL PROTEIN: 7.2 g/dL (ref 6.5–8.1)
Total Bilirubin: 1.3 mg/dL — ABNORMAL HIGH (ref 0.3–1.2)

## 2018-01-21 LAB — HEMOGLOBIN A1C
HEMOGLOBIN A1C: 11.3 % — AB (ref 4.8–5.6)
Mean Plasma Glucose: 277.61 mg/dL

## 2018-01-21 LAB — SURGICAL PCR SCREEN
MRSA, PCR: NEGATIVE
STAPHYLOCOCCUS AUREUS: NEGATIVE

## 2018-01-21 LAB — APTT: APTT: 25 s (ref 24–36)

## 2018-01-21 LAB — GLUCOSE, CAPILLARY: Glucose-Capillary: 252 mg/dL — ABNORMAL HIGH (ref 65–99)

## 2018-01-21 LAB — PROTIME-INR
INR: 0.99
Prothrombin Time: 13 seconds (ref 11.4–15.2)

## 2018-01-21 NOTE — Progress Notes (Signed)
PCP Is Dr. [name] manages his Dm Cardiologist is Dr [name] States he had an MI 6 months ago with stent placed. Denies chest pain, cough, or fever. Echo 07-31-17 Stress test 11-20-14 Card cath 07-31-17 Cbg today 252 reports fasting Cbg's run in the 180's States last Brilinta 01-14-18 States he was told to continue taking Aspirin.

## 2018-01-22 ENCOUNTER — Telehealth: Payer: Self-pay | Admitting: *Deleted

## 2018-01-22 ENCOUNTER — Telehealth: Payer: Self-pay | Admitting: Surgery

## 2018-01-22 NOTE — Telephone Encounter (Signed)
-----   Message from Retta Mac, RN sent at 01/22/2018 12:49 PM EST ----- Regarding: sched. office appointment Patient will need office appointment with Dr. Myra Gianotti. Surgery canceled due to uncontrolled diabetes. Is to see PCP to get under control then see Dr. Myra Gianotti in 5-6 weeks. Thanks B

## 2018-01-22 NOTE — Telephone Encounter (Signed)
Surgery for 01/23/18 canceled by Dr. Myra Gianotti due to A1C of >11 and average CBG's of 270. Called patient to inform him and instructed to follow up with his physician that monitors his diabetes ASAP.Explained risk of surgery with diabetes uncontrolled.  Informed patient that will schedule follow up with dr. Myra Gianotti in 4-6 weeks. Verbalized understanding.

## 2018-01-22 NOTE — Progress Notes (Addendum)
Anesthesia Chart Review:  Pt is a 52 year old male scheduled for R fem-above the knee popliteal artery bypass graft on 01/23/2018 with Coral Else, MD  - PCP is Fleet Contras, MD - Cardiologist is [name], MD who cleared pt for surgery and gave ok to hold Brilinta (see telephone encounter 12/21/17 by Anette Riedel, RN). Last office visit 11/09/17  PMH includes:  CAD (MI/cardiac arrest -> DES to LAD 2013; severe stent restenosis treated with cutting balloon angioplasty 10/2016; STEMI -> DES to LAD proximal to old stent, POBA to ostial diag thru stent strut 07/31/17), ischemic cardiomyopathy, PAD (s/p L carotid-subclavian bypass graft 04/03/13; s/p AFBG 01/16/13), HTN, DM, hyperlipidemia, GERD. Current smoker. BMI 31  Medications: ASA 81mg , carvedilol, enalapril, Lantus, Imdur, simvastatin, Brilinta. Pt to hold Brilinta 7 days before surgery.  BP 95/64   Pulse 76   Temp 36.7 C   Resp 20   Ht 5\' 9"  (1.753 m)   Wt 211 lb 11.2 oz (96 kg)   SpO2 98%   BMI 31.26 kg/m   Preoperative labs reviewed.   - HbA1c 11.3, glucose 164 - I notified Becky in Dr. Estanislado Spire office of uncontrolled DM   EKG 09/05/17: NSR.  Septal infarct, age undetermined.  Lateral infarct, age undetermined.  T wave abnormality, consider anterior ischemia.  Cardiac cath 07/31/17 (care everywhere): 1. Acute occlusion of Prox LAD, just proxomial to old stents. (STEMI). S/p DES to LAD. POBA ostial Diag thru stent strut 2. Mild disease elsewhere 3. Anteroapical/apical wall hypokinesis, LVEF 40%  Echo 07/31/17 (care everywhere):  - Distal anterior, Anteroapical/apical wall hypokinesis - LVEF 35-40% - No significant valvular abnormalites noted  If glucose acceptable day of surgery, I anticipate pt can proceed with surgery as scheduled.   Rica Mast, FNP-BC [facility] Stay Surgical [facility]/Anesthesiology Phone: [phone] 01/22/2018 9:53 AM

## 2018-01-22 NOTE — Telephone Encounter (Signed)
Spoke to pt to sch appt 02/25/18 at 1:15. Pt is seeing PCP today 01/22/18.

## 2018-01-23 ENCOUNTER — Inpatient Hospital Stay (HOSPITAL_COMMUNITY): Admission: RE | Admit: 2018-01-23 | Payer: Medicaid Other | Source: Ambulatory Visit | Admitting: Surgery

## 2018-01-23 ENCOUNTER — Encounter (HOSPITAL_COMMUNITY): Admission: RE | Payer: Self-pay | Source: Ambulatory Visit

## 2018-01-23 SURGERY — BYPASS GRAFT FEMORAL-POPLITEAL ARTERY
Anesthesia: Choice | Laterality: Right

## 2018-02-25 ENCOUNTER — Ambulatory Visit (INDEPENDENT_AMBULATORY_CARE_PROVIDER_SITE_OTHER): Payer: Medicaid Other | Admitting: Surgery

## 2018-02-25 ENCOUNTER — Other Ambulatory Visit: Payer: Self-pay | Admitting: *Deleted

## 2018-02-25 ENCOUNTER — Encounter: Payer: Self-pay | Admitting: Surgery

## 2018-02-25 ENCOUNTER — Telehealth: Payer: Self-pay | Admitting: *Deleted

## 2018-02-25 ENCOUNTER — Encounter: Payer: Self-pay | Admitting: *Deleted

## 2018-02-25 VITALS — BP 100/72 | HR 91 | Resp 20 | Ht 69.0 in | Wt 209.4 lb

## 2018-02-25 DIAGNOSIS — I70211 Atherosclerosis of native arteries of extremities with intermittent claudication, right leg: Secondary | ICD-10-CM | POA: Diagnosis not present

## 2018-02-25 NOTE — H&P (View-Only) (Signed)
[facility] [place]  Patient name: Ryan [name] MRN: [id number] DOB: [birth year] Sex: male   REASON FOR VISIT:    Follow up  HISOTRY OF PRESENT ILLNESS:    Ryan [name] is a 52 y.o. male who have known for a while with a history of claudication.  He enters went aortobifemoral bypass graft using a 14 x 7 bifurcated graft on 01/16/2013.  He went on to develop subclavian steal with left arm claudication and on 04/03/2013 he had a left carotid subclavian bypass graft with 8 mm dacryon.  He had to go back to the operating room 516 for drainage of a lymphocele.  At his most recent ultrasound there were elevated velocities within the right groin at the distal anastomosis.  The patient is having significant symptoms of claudication which is significantly altering his quality of life.  At angiography, I was able to perform balloon angioplasty of the right common femoral artery.  Unfortunately he has not had significant improvement in his symptoms.  He was scheduled for right femoral endarterectomy and right femoral above-knee popliteal bypass graft, however this was canceled because of a very high hemoglobin A1c.  And an MI which was treated in [facility].  He is back today to discuss surgical options and bypass.  He has been cleared to proceed with surgery by cardiology.  He states that his left leg is bothering him now just as much as the right.  His hemoglobin A1c is down to 8.  He is now taking his insulin which she was not taking before.  He is cut back on smoking.  He now smokes 4-5 cigarettes/day.   PAST MEDICAL HISTORY:   Past Medical History:  Diagnosis Date  . Anal fissure   . Bruises easily    d/t being on Effient  . Coronary atherosclerosis of native coronary artery    a. ant STEMI with cardiac arrest 2013 s/p DES to mLAD. b. [place] 10/2016 s/p PTCA to mLAD.  . Diabetes mellitus without complication (HCC)   . GERD  (gastroesophageal reflux disease)    "takes tums"  . History of blood transfusion    no abnormal reaction noted  . History of kidney stones   . Hypertension   . Ischemic cardiomyopathy    a. EF 35% in 02/2012 at time of acute MI, improved to normal on subsequent imaging.  . MI (myocardial infarction) (HCC)    AMI 1/13 - complicated by VT/Tosades  . Mixed hyperlipidemia   . PAD (peripheral artery disease) (HCC)    followed by Vascular - aortobifem bypass 01/2013 & left carotid-subclavian artery bypass 04/2013  . Rectal polyp      FAMILY HISTORY:   Family History  Problem Relation Age of Onset  . Cancer Father        Lung  . Diabetes Mother   . COPD Mother   . Cancer Maternal Uncle        Colon    SOCIAL HISTORY:   Social History   Tobacco Use  . Smoking status: Current Every Day Smoker    Packs/day: 0.25    Years: 25.00    Pack years: 6.25    Types: Cigarettes  . Smokeless tobacco: Never Used  Substance Use Topics  . Alcohol use: No    Alcohol/week: 0.0 oz     ALLERGIES:   Allergies  Allergen Reactions  . Hydrocodone Nausea And Vomiting and Hives     CURRENT MEDICATIONS:   Current Outpatient  Medications  Medication Sig Dispense Refill  . aspirin EC 81 MG tablet Take 1 tablet (81 mg total) by mouth daily. 90 tablet 3  . calcium carbonate (TUMS - DOSED IN MG ELEMENTAL CALCIUM) 500 MG chewable tablet Chew 1 tablet by mouth daily as needed for indigestion or heartburn.    . carvedilol (COREG) 6.25 MG tablet Take 1 tablet (6.25 mg total) by mouth 2 (two) times daily. 180 tablet 3  . cyclobenzaprine (FLEXERIL) 10 MG tablet Take 10 mg by mouth daily.     . enalapril (VASOTEC) 5 MG tablet TAKE ONE TABLET BY MOUTH TWICE DAILY 60 tablet 2  . Insulin Glargine (LANTUS SOLOSTAR) 100 UNIT/ML Solostar Pen Inject 55 Units into the skin at bedtime.     . isosorbide mononitrate (IMDUR) 30 MG 24 hr tablet TAKE 1 TABLET BY MOUTH EVERY DAY 90 tablet 2  . nitroGLYCERIN  (NITROSTAT) 0.4 MG SL tablet Place 0.4 mg under the tongue every 5 (five) minutes as needed for chest pain.     . simvastatin (ZOCOR) 80 MG tablet Take 80 mg by mouth at bedtime.    . ticagrelor (BRILINTA) 90 MG TABS tablet Take 90 mg by mouth 2 (two) times daily.     . traZODone (DESYREL) 100 MG tablet Take 100 mg by mouth at bedtime as needed for sleep.    Marland Kitchen spironolactone (ALDACTONE) 25 MG tablet Take 0.5 tablets (12.5 mg total) by mouth daily. (Patient not taking: Reported on 01/11/2018) 45 tablet 3   No current facility-administered medications for this visit.     REVIEW OF SYSTEMS:   [X]  denotes positive finding, [ ]  denotes negative finding Cardiac  Comments:  Chest pain or chest pressure:    Shortness of breath upon exertion:    Short of breath when lying flat:    Irregular heart rhythm:        Vascular    Pain in calf, thigh, or hip brought on by ambulation:  Palpable femoral pulsesx   Pain in feet at night that wakes you up from your sleep:  x   Blood clot in your veins:    Leg swelling:         Pulmonary    Oxygen at home:    Productive cough:     Wheezing:         Neurologic    Sudden weakness in arms or legs:     Sudden numbness in arms or legs:     Sudden onset of difficulty speaking or slurred speech:    Temporary loss of vision in one eye:     Problems with dizziness:         Gastrointestinal    Blood in stool:     Vomited blood:         Genitourinary    Burning when urinating:     Blood in urine:        Psychiatric    Major depression:         Hematologic    Bleeding problems:    Problems with blood clotting too easily:        Skin    Rashes or ulcers:        Constitutional    Fever or chills:      PHYSICAL EXAM:   Vitals:   02/25/18 0900  BP: 100/72  Pulse: 91  Resp: 20  SpO2: 98%  Weight: 209 lb 6.4 oz (95 kg)  Height: 5\' 9"  (1.753 m)  GENERAL: The patient is a well-nourished male, in no acute distress. The vital signs are  documented above. CARDIAC: There is a regular rate and rhythm.  VASCULAR: palpable femoral pulses PULMONARY: Non-labored respirations ABDOMEN: Soft and non-tender with normal pitched bowel sounds.  MUSCULOSKELETAL: There are no major deformities or cyanosis. NEUROLOGIC: No focal weakness or paresthesias are detected. SKIN: There are no ulcers or rashes noted. PSYCHIATRIC: The patient has a normal affect.  STUDIES:   CT angiogram was ordered  MEDICAL ISSUES:   Bilateral claudication, right greater than left: The patient has been cleared to proceed with right femoral-popliteal bypass graft.  I am also going to evaluate the distal limb of his aortobifemoral graft.  He will most likely require right femoral endarterectomy in addition to a femoral to above-knee popliteal bypass graft.  I will evaluate his vein in the operating room and performed his bypass with either Gore-Tex or saphenous vein.  Because it has been approximately 8 months since the patient has been imaged, and also because he is having new left leg symptoms, I am ordering a CT angiogram to confirm our operative plan and to evaluate options for the left leg.  I have scheduled his right femoral endarterectomy and right femoral to above-knee popliteal artery bypass graft for Wednesday, April 10.  He will stop his Brilinta prior to surgery.  We discussed the risks and benefits of the operation including the long-term patency, as well as other potential complications.  All of his questions were answered.    Durene Cal, MD [facility] [phone] Pager [phone]

## 2018-02-25 NOTE — Telephone Encounter (Signed)
Patient called with time change for 03/13/18 procedure. To be at admitting at [facility] at 8:30 am . Verbalized understanding .

## 2018-02-25 NOTE — Progress Notes (Signed)
[facility] and Vein Specialist of [place]  Patient name: Ryan [name] MRN: [id number] DOB: [birth year] Sex: male   REASON FOR VISIT:    Follow up  HISOTRY OF PRESENT ILLNESS:    Ryan [name] is a 52 y.o. male who have known for a while with a history of claudication.  He enters went aortobifemoral bypass graft using a 14 x 7 bifurcated graft on 01/16/2013.  He went on to develop subclavian steal with left arm claudication and on 04/03/2013 he had a left carotid subclavian bypass graft with 8 mm dacryon.  He had to go back to the operating room 516 for drainage of a lymphocele.  At his most recent ultrasound there were elevated velocities within the right groin at the distal anastomosis.  The patient is having significant symptoms of claudication which is significantly altering his quality of life.  At angiography, I was able to perform balloon angioplasty of the right common femoral artery.  Unfortunately he has not had significant improvement in his symptoms.  He was scheduled for right femoral endarterectomy and right femoral above-knee popliteal bypass graft, however this was canceled because of a very high hemoglobin A1c.  And an MI which was treated in [facility].  He is back today to discuss surgical options and bypass.  He has been cleared to proceed with surgery by cardiology.  He states that his left leg is bothering him now just as much as the right.  His hemoglobin A1c is down to 8.  He is now taking his insulin which she was not taking before.  He is cut back on smoking.  He now smokes 4-5 cigarettes/day.   PAST MEDICAL HISTORY:   Past Medical History:  Diagnosis Date  . Anal fissure   . Bruises easily    d/t being on Effient  . Coronary atherosclerosis of native coronary artery    a. ant STEMI with cardiac arrest 2013 s/p DES to mLAD. b. [place] 10/2016 s/p PTCA to mLAD.  . Diabetes mellitus without complication (HCC)   . GERD  (gastroesophageal reflux disease)    "takes tums"  . History of blood transfusion    no abnormal reaction noted  . History of kidney stones   . Hypertension   . Ischemic cardiomyopathy    a. EF 35% in 02/2012 at time of acute MI, improved to normal on subsequent imaging.  . MI (myocardial infarction) (HCC)    AMI 1/13 - complicated by VT/Tosades  . Mixed hyperlipidemia   . PAD (peripheral artery disease) (HCC)    followed by Vascular - aortobifem bypass 01/2013 & left carotid-subclavian artery bypass 04/2013  . Rectal polyp      FAMILY HISTORY:   Family History  Problem Relation Age of Onset  . Cancer Father        Lung  . Diabetes Mother   . COPD Mother   . Cancer Maternal Uncle        Colon    SOCIAL HISTORY:   Social History   Tobacco Use  . Smoking status: Current Every Day Smoker    Packs/day: 0.25    Years: 25.00    Pack years: 6.25    Types: Cigarettes  . Smokeless tobacco: Never Used  Substance Use Topics  . Alcohol use: No    Alcohol/week: 0.0 oz     ALLERGIES:   Allergies  Allergen Reactions  . Hydrocodone Nausea And Vomiting and Hives     CURRENT MEDICATIONS:   Current Outpatient  Medications  Medication Sig Dispense Refill  . aspirin EC 81 MG tablet Take 1 tablet (81 mg total) by mouth daily. 90 tablet 3  . calcium carbonate (TUMS - DOSED IN MG ELEMENTAL CALCIUM) 500 MG chewable tablet Chew 1 tablet by mouth daily as needed for indigestion or heartburn.    . carvedilol (COREG) 6.25 MG tablet Take 1 tablet (6.25 mg total) by mouth 2 (two) times daily. 180 tablet 3  . cyclobenzaprine (FLEXERIL) 10 MG tablet Take 10 mg by mouth daily.     . enalapril (VASOTEC) 5 MG tablet TAKE ONE TABLET BY MOUTH TWICE DAILY 60 tablet 2  . Insulin Glargine (LANTUS SOLOSTAR) 100 UNIT/ML Solostar Pen Inject 55 Units into the skin at bedtime.     . isosorbide mononitrate (IMDUR) 30 MG 24 hr tablet TAKE 1 TABLET BY MOUTH EVERY DAY 90 tablet 2  . nitroGLYCERIN  (NITROSTAT) 0.4 MG SL tablet Place 0.4 mg under the tongue every 5 (five) minutes as needed for chest pain.     . simvastatin (ZOCOR) 80 MG tablet Take 80 mg by mouth at bedtime.    . ticagrelor (BRILINTA) 90 MG TABS tablet Take 90 mg by mouth 2 (two) times daily.     . traZODone (DESYREL) 100 MG tablet Take 100 mg by mouth at bedtime as needed for sleep.    Marland Kitchen spironolactone (ALDACTONE) 25 MG tablet Take 0.5 tablets (12.5 mg total) by mouth daily. (Patient not taking: Reported on 01/11/2018) 45 tablet 3   No current facility-administered medications for this visit.     REVIEW OF SYSTEMS:   [X]  denotes positive finding, [ ]  denotes negative finding Cardiac  Comments:  Chest pain or chest pressure:    Shortness of breath upon exertion:    Short of breath when lying flat:    Irregular heart rhythm:        Vascular    Pain in calf, thigh, or hip brought on by ambulation:  Palpable femoral pulsesx   Pain in feet at night that wakes you up from your sleep:  x   Blood clot in your veins:    Leg swelling:         Pulmonary    Oxygen at home:    Productive cough:     Wheezing:         Neurologic    Sudden weakness in arms or legs:     Sudden numbness in arms or legs:     Sudden onset of difficulty speaking or slurred speech:    Temporary loss of vision in one eye:     Problems with dizziness:         Gastrointestinal    Blood in stool:     Vomited blood:         Genitourinary    Burning when urinating:     Blood in urine:        Psychiatric    Major depression:         Hematologic    Bleeding problems:    Problems with blood clotting too easily:        Skin    Rashes or ulcers:        Constitutional    Fever or chills:      PHYSICAL EXAM:   Vitals:   02/25/18 0900  BP: 100/72  Pulse: 91  Resp: 20  SpO2: 98%  Weight: 209 lb 6.4 oz (95 kg)  Height: 5\' 9"  (1.753 m)  GENERAL: The patient is a well-nourished male, in no acute distress. The vital signs are  documented above. CARDIAC: There is a regular rate and rhythm.  VASCULAR: palpable femoral pulses PULMONARY: Non-labored respirations ABDOMEN: Soft and non-tender with normal pitched bowel sounds.  MUSCULOSKELETAL: There are no major deformities or cyanosis. NEUROLOGIC: No focal weakness or paresthesias are detected. SKIN: There are no ulcers or rashes noted. PSYCHIATRIC: The patient has a normal affect.  STUDIES:   CT angiogram was ordered  MEDICAL ISSUES:   Bilateral claudication, right greater than left: The patient has been cleared to proceed with right femoral-popliteal bypass graft.  I am also going to evaluate the distal limb of his aortobifemoral graft.  He will most likely require right femoral endarterectomy in addition to a femoral to above-knee popliteal bypass graft.  I will evaluate his vein in the operating room and performed his bypass with either Gore-Tex or saphenous vein.  Because it has been approximately 8 months since the patient has been imaged, and also because he is having new left leg symptoms, I am ordering a CT angiogram to confirm our operative plan and to evaluate options for the left leg.  I have scheduled his right femoral endarterectomy and right femoral to above-knee popliteal artery bypass graft for Wednesday, April 10.  He will stop his Brilinta prior to surgery.  We discussed the risks and benefits of the operation including the long-term patency, as well as other potential complications.  All of his questions were answered.    Durene Cal, MD [facility] [phone] Pager [phone]

## 2018-02-27 ENCOUNTER — Other Ambulatory Visit: Payer: Self-pay

## 2018-02-27 DIAGNOSIS — I70211 Atherosclerosis of native arteries of extremities with intermittent claudication, right leg: Secondary | ICD-10-CM

## 2018-02-27 DIAGNOSIS — I739 Peripheral vascular disease, unspecified: Secondary | ICD-10-CM

## 2018-02-28 ENCOUNTER — Telehealth: Payer: Self-pay | Admitting: Surgery

## 2018-02-28 NOTE — Telephone Encounter (Signed)
Sched CTA same day as pre admission testing 03/06/18 at [facility] at 10:30. Spoke to pt to inform them of cta appt and instructions. Sent precert stf mssg.

## 2018-03-05 NOTE — Pre-Procedure Instructions (Signed)
Ryan [name]  03/05/2018      [id number] - [place], [place] - [address] [place] [address] Phone: [phone] Fax: [phone]  [facility] - [place], [place] - [address] [place] [address] Phone: [phone] Fax: [phone]    Your procedure is scheduled on March 13, 2018.  Report to [facility] Admitting at 830 AM.  Call this number if you have problems the morning of surgery:  [phone]   Remember:  Do not eat food or drink liquids after midnight.  Take these medicines the morning of surgery with A SIP OF WATER aspirin, carvedilol (coreg), cyclobenzaprine (flexeril), isosorbide mononitrate (imdur).  Stop taking Brilinta 7 days prior to surgery and continue taking aspirin as instructed by your surgeon.  Beginning now, STOP taking any Aleve, Naproxen, Ibuprofen, Motrin, Advil, Goody's, BC's, all herbal medications, fish oil, and all vitamins  Continue all other medications as instructed by your physician except follow the above medication instructions before surgery   WHAT DO I DO ABOUT MY DIABETES MEDICATION?   Marland Kitchen Do not take oral diabetes medicines (pills) the morning of surgery empagliflozin (jardiance), .   . THE NIGHT BEFORE SURGERY, take 37  units of lantus insulin (1/2 of  Your normal dose).      . The day of surgery, do not take other diabetes injectables, including Byetta (exenatide), Bydureon (exenatide ER), Victoza (liraglutide), or Trulicity (dulaglutide).  . If your CBG is greater than 220 mg/dL, you may take  of your sliding scale (correction) dose of insulin.   Reviewed and Endorsed by [facility] Patient Education Committee, August 2015  How to Manage Your Diabetes Before and After Surgery  Why is it important to control my blood sugar before and after surgery? . Improving blood sugar levels before and after surgery helps healing and can limit problems. . A way of  improving blood sugar control is eating a healthy diet by: o  Eating less sugar and carbohydrates o  Increasing activity/exercise o  Talking with your doctor about reaching your blood sugar goals . High blood sugars (greater than 180 mg/dL) can raise your risk of infections and slow your recovery, so you will need to focus on controlling your diabetes during the weeks before surgery. . Make sure that the doctor who takes care of your diabetes knows about your planned surgery including the date and location.  How do I manage my blood sugar before surgery? . Check your blood sugar at least 4 times a day, starting 2 days before surgery, to make sure that the level is not too high or low. o Check your blood sugar the morning of your surgery when you wake up and every 2 hours until you get to the Short Stay unit. . If your blood sugar is less than 70 mg/dL, you will need to treat for low blood sugar: o Do not take insulin. o Treat a low blood sugar (less than 70 mg/dL) with  cup of clear juice (cranberry or apple), 4 glucose tablets, OR glucose gel. Recheck blood sugar in 15 minutes after treatment (to make sure it is greater than 70 mg/dL). If your blood sugar is not greater than 70 mg/dL on recheck, call [phone] o  for further instructions. . Report your blood sugar to the short stay nurse when you get to Short Stay.  . If you are admitted to the hospital after surgery: o  Your blood sugar will be checked by the staff and you will probably be given insulin after surgery (instead of oral diabetes medicines) to make sure you have good blood sugar levels. o The goal for blood sugar control after surgery is 80-180 mg/dL.    Do not wear jewelry.  Do not wear lotions, powders, or colognes, or deodorant.  Men may shave face and neck.  Do not bring valuables to the hospital.  [facility] is not responsible for any belongings or valuables.  Contacts, dentures or bridgework may not be worn into  surgery.  Leave your suitcase in the car.  After surgery it may be brought to your room.  For patients admitted to the hospital, discharge time will be determined by your treatment team.  Patients discharged the day of surgery will not be allowed to drive home.    - Preparing For Surgery  Before surgery, you can play an important role. Because skin is not sterile, your skin needs to be as free of germs as possible. You can reduce the number of germs on your skin by washing with CHG (chlorahexidine gluconate) Soap before surgery.  CHG is an antiseptic cleaner which kills germs and bonds with the skin to continue killing germs even after washing.  Please do not use if you have an allergy to CHG or antibacterial soaps. If your skin becomes reddened/irritated stop using the CHG.  Do not shave (including legs and underarms) for at least 48 hours prior to first CHG shower. It is OK to shave your face.  Please follow these instructions carefully.   1. Shower the NIGHT BEFORE SURGERY and the MORNING OF SURGERY with CHG.   2. If you chose to wash your hair, wash your hair first as usual with your normal shampoo.  3. After you shampoo, rinse your hair and body thoroughly to remove the shampoo.  4. Use CHG as you would any other liquid soap. You can apply CHG directly to the skin and wash gently with a scrungie or a clean washcloth.   5. Apply the CHG Soap to your body ONLY FROM THE NECK DOWN.  Do not use on open wounds or open sores. Avoid contact with your eyes, ears, mouth and genitals (private parts). Wash Face and genitals (private parts)  with your normal soap.  6. Wash thoroughly, paying special attention to the area where your surgery will be performed.  7. Thoroughly rinse your body with warm water from the neck down.  8. DO NOT shower/wash with your normal soap after using and rinsing off the CHG Soap.  9. Pat yourself dry with a CLEAN TOWEL.  10. Wear CLEAN PAJAMAS to bed  the night before surgery, wear comfortable clothes the morning of surgery  11. Place CLEAN SHEETS on your bed the night of your first shower and DO NOT SLEEP WITH PETS.  Day of Surgery: Do not apply any deodorants/lotions. Please wear clean clothes to the hospital/surgery center.    Please read over the following fact sheets that you were given. Pain Booklet, Coughing and Deep Breathing, MRSA Information and Surgical Site Infection Prevention

## 2018-03-06 ENCOUNTER — Other Ambulatory Visit: Payer: Self-pay

## 2018-03-06 ENCOUNTER — Encounter (HOSPITAL_COMMUNITY): Payer: Self-pay

## 2018-03-06 ENCOUNTER — Ambulatory Visit (HOSPITAL_COMMUNITY)
Admission: RE | Admit: 2018-03-06 | Discharge: 2018-03-06 | Disposition: A | Discharge status: Home / Self Care | Payer: Medicaid Other | Source: Ambulatory Visit | Attending: Surgery | Admitting: Surgery

## 2018-03-06 ENCOUNTER — Encounter (HOSPITAL_COMMUNITY)
Admission: RE | Admit: 2018-03-06 | Discharge: 2018-03-06 | Disposition: A | Discharge status: Home / Self Care | Payer: Medicaid Other | Source: Ambulatory Visit | Attending: Surgery | Admitting: Surgery

## 2018-03-06 DIAGNOSIS — Z7982 Long term (current) use of aspirin: Secondary | ICD-10-CM | POA: Insufficient documentation

## 2018-03-06 DIAGNOSIS — I739 Peripheral vascular disease, unspecified: Secondary | ICD-10-CM

## 2018-03-06 DIAGNOSIS — I10 Essential (primary) hypertension: Secondary | ICD-10-CM | POA: Diagnosis not present

## 2018-03-06 DIAGNOSIS — Z955 Presence of coronary angioplasty implant and graft: Secondary | ICD-10-CM | POA: Insufficient documentation

## 2018-03-06 DIAGNOSIS — Z95828 Presence of other vascular implants and grafts: Secondary | ICD-10-CM | POA: Diagnosis not present

## 2018-03-06 DIAGNOSIS — Z7902 Long term (current) use of antithrombotics/antiplatelets: Secondary | ICD-10-CM | POA: Insufficient documentation

## 2018-03-06 DIAGNOSIS — F17200 Nicotine dependence, unspecified, uncomplicated: Secondary | ICD-10-CM | POA: Insufficient documentation

## 2018-03-06 DIAGNOSIS — I252 Old myocardial infarction: Secondary | ICD-10-CM | POA: Insufficient documentation

## 2018-03-06 DIAGNOSIS — Z01818 Encounter for other preprocedural examination: Secondary | ICD-10-CM | POA: Diagnosis present

## 2018-03-06 DIAGNOSIS — Z01812 Encounter for preprocedural laboratory examination: Secondary | ICD-10-CM | POA: Diagnosis not present

## 2018-03-06 DIAGNOSIS — E1151 Type 2 diabetes mellitus with diabetic peripheral angiopathy without gangrene: Secondary | ICD-10-CM | POA: Diagnosis not present

## 2018-03-06 DIAGNOSIS — E785 Hyperlipidemia, unspecified: Secondary | ICD-10-CM | POA: Diagnosis not present

## 2018-03-06 DIAGNOSIS — Z8674 Personal history of sudden cardiac arrest: Secondary | ICD-10-CM | POA: Diagnosis not present

## 2018-03-06 DIAGNOSIS — Z79899 Other long term (current) drug therapy: Secondary | ICD-10-CM | POA: Diagnosis not present

## 2018-03-06 DIAGNOSIS — I2510 Atherosclerotic heart disease of native coronary artery without angina pectoris: Secondary | ICD-10-CM | POA: Diagnosis not present

## 2018-03-06 DIAGNOSIS — I70211 Atherosclerosis of native arteries of extremities with intermittent claudication, right leg: Secondary | ICD-10-CM

## 2018-03-06 DIAGNOSIS — Z0183 Encounter for blood typing: Secondary | ICD-10-CM | POA: Diagnosis not present

## 2018-03-06 DIAGNOSIS — K219 Gastro-esophageal reflux disease without esophagitis: Secondary | ICD-10-CM | POA: Diagnosis not present

## 2018-03-06 DIAGNOSIS — Z794 Long term (current) use of insulin: Secondary | ICD-10-CM | POA: Insufficient documentation

## 2018-03-06 LAB — TYPE AND SCREEN
ABO/RH(D): O POS
ANTIBODY SCREEN: NEGATIVE

## 2018-03-06 LAB — CBC
HEMATOCRIT: 49.4 % (ref 39.0–52.0)
Hemoglobin: 16.6 g/dL (ref 13.0–17.0)
MCH: 32.3 pg (ref 26.0–34.0)
MCHC: 33.6 g/dL (ref 30.0–36.0)
MCV: 96.1 fL (ref 78.0–100.0)
Platelets: 238 10*3/uL (ref 150–400)
RBC: 5.14 MIL/uL (ref 4.22–5.81)
RDW: 13.9 % (ref 11.5–15.5)
WBC: 9.9 10*3/uL (ref 4.0–10.5)

## 2018-03-06 LAB — URINALYSIS, ROUTINE W REFLEX MICROSCOPIC
BACTERIA UA: NONE SEEN
BILIRUBIN URINE: NEGATIVE
Glucose, UA: >=500 mg/dL — AB
Hgb urine dipstick: NEGATIVE
Ketones, ur: NEGATIVE mg/dL
Leukocytes, UA: NEGATIVE
NITRITE: NEGATIVE
PH: 6 (ref 5.0–8.0)
Protein, ur: NEGATIVE mg/dL
SPECIFIC GRAVITY, URINE: 1 — AB (ref 1.005–1.030)
Squamous Epithelial / LPF: NONE SEEN
WBC, UA: NONE SEEN WBC/hpf (ref 0–5)

## 2018-03-06 LAB — COMPREHENSIVE METABOLIC PANEL
ALT: 34 U/L (ref 17–63)
AST: 28 U/L (ref 15–41)
Albumin: 4.1 g/dL (ref 3.5–5.0)
Alkaline Phosphatase: 72 U/L (ref 38–126)
Anion gap: 9 (ref 5–15)
BILIRUBIN TOTAL: 0.8 mg/dL (ref 0.3–1.2)
BUN: 16 mg/dL (ref 6–20)
CO2: 22 mmol/L (ref 22–32)
Calcium: 8.8 mg/dL — ABNORMAL LOW (ref 8.9–10.3)
Chloride: 104 mmol/L (ref 101–111)
Creatinine, Ser: 1.15 mg/dL (ref 0.61–1.24)
Glucose, Bld: 97 mg/dL (ref 65–99)
POTASSIUM: 4 mmol/L (ref 3.5–5.1)
Sodium: 135 mmol/L (ref 135–145)
TOTAL PROTEIN: 7.2 g/dL (ref 6.5–8.1)

## 2018-03-06 LAB — GLUCOSE, CAPILLARY: GLUCOSE-CAPILLARY: 102 mg/dL — AB (ref 65–99)

## 2018-03-06 LAB — PROTIME-INR
INR: 1.01
PROTHROMBIN TIME: 13.2 s (ref 11.4–15.2)

## 2018-03-06 LAB — APTT: aPTT: 30 seconds (ref 24–36)

## 2018-03-06 LAB — SURGICAL PCR SCREEN
MRSA, PCR: NEGATIVE
STAPHYLOCOCCUS AUREUS: NEGATIVE

## 2018-03-06 MED ORDER — IOPAMIDOL (ISOVUE-370) INJECTION 76%
INTRAVENOUS | Status: AC
  Administered 2018-03-06: 100 mL via INTRAVENOUS
  Filled 2018-03-06: qty 100

## 2018-03-06 MED ORDER — IOPAMIDOL (ISOVUE-370) INJECTION 76%
INTRAVENOUS | Status: AC
  Filled 2018-03-06: qty 100

## 2018-03-06 NOTE — Progress Notes (Addendum)
PCP - Dr. Fleet Contras  Cardiologist - Dr. [name]  Chest x-ray - Denies  EKG - 09/05/17 (E)  Stress Test - 11/20/14 (E)  ECHO - 02/16/12 (E)  Cardiac Cath - 10/23/16 (E)  Sleep Study - Denies- Positive Stop Bang-sent to PCP CPAP - None  LABS- 03/06/18: CBC, CMP, PT, PTT, T/S UA  HA1C- Requested- Pt sts it is down to 8 Fasting Blood Sugar - 100, Today 102 Checks Blood Sugar ___2__ times a day  Pt sts last dose of Brillinta was 4/2.  Anesthesia- Yes- Previous consult  Pt denies having chest pain, sob, or fever at this time. All instructions explained to the pt, with a verbal understanding of the material. Pt agrees to go over the instructions while at home for a better understanding. The opportunity to ask questions was provided.

## 2018-03-06 NOTE — Progress Notes (Signed)
   03/06/18 0928  OBSTRUCTIVE SLEEP APNEA  Have you ever been diagnosed with sleep apnea through a sleep study? No  Do you snore loudly (loud enough to be heard through closed doors)?  1  Do you often feel tired, fatigued, or sleepy during the daytime (such as falling asleep during driving or talking to someone)? 0  Has anyone observed you stop breathing during your sleep? 0  Do you have, or are you being treated for high blood pressure? 1  BMI more than 35 kg/m2? 0  Age > 50 (1-yes) 1  Neck circumference greater than:Male 16 inches or larger, Male 17inches or larger? 1  Male Gender (Yes=1) 1  Obstructive Sleep Apnea Score 5  Score 5 or greater  Results sent to PCP

## 2018-03-08 NOTE — Progress Notes (Signed)
Anesthesia Chart Review:  Pt is a 52 year old male scheduled for R fem-above the knee popliteal artery bypass graft on 01/23/2018 with Coral Else, MD  - Procedure was originally scheduled for 01/23/18 but was postponed due to uncontrolled DM (HbA1c 11.3)  - PCP is Fleet Contras, MD. Last office visit 02/21/18 - Cardiologist is [name], MD who cleared pt for surgery and gave ok to hold Brilinta (see telephone encounter 12/21/17 by Anette Riedel, RN). Last office visit 11/09/17  PMH includes:  CAD (MI/cardiac arrest -> DES to LAD 2013; severe stent restenosis treated with cutting balloon angioplasty 10/2016; STEMI -> DES to LAD proximal to old stent, POBA to ostial diag thru stent strut 07/31/17), ischemic cardiomyopathy, PAD (s/p L carotid-subclavian bypass graft 04/03/13; s/p AFBG 01/16/13), HTN, DM, hyperlipidemia, GERD. Current smoker. BMI 31  Medications: ASA 81mg , carvedilol, enalapril, Lantus, Imdur, simvastatin, Brilinta. Pt to hold Brilinta 7 days before surgery.  BP 96/63   Pulse 78   Temp 36.8 C (Oral)   Resp 18   Ht 5\' 9"  (1.753 m)   Wt 209 lb 8 oz (95 kg)   SpO2 98%   BMI 30.94 kg/m    Preoperative labs reviewed.   - glucose 197. HbA1c was 8.0 at PCP's office 02/21/18  EKG 09/05/17: NSR.  Septal infarct, age undetermined.  Lateral infarct, age undetermined.  T wave abnormality, consider anterior ischemia.  Cardiac cath 07/31/17 (care everywhere): 1. Acute occlusion of Prox LAD, just proxomial to old stents. (STEMI). S/p DES to LAD. POBA ostial Diag thru stent strut 2. Mild disease elsewhere 3. Anteroapical/apical wall hypokinesis, LVEF 40%  Echo 07/31/17 (care everywhere):  - Distal anterior, Anteroapical/apical wall hypokinesis - LVEF 35-40% - No significant valvular abnormalites noted  If no changes, I anticipate pt can proceed with surgery as scheduled.   Rica Mast, FNP-BC [facility]/Anesthesiology Phone:  [phone] 03/08/2018 12:18 PM

## 2018-03-13 ENCOUNTER — Encounter (HOSPITAL_COMMUNITY)
Admission: RE | Disposition: A | Discharge status: Home / Self Care | Payer: Self-pay | Source: Ambulatory Visit | Attending: Surgery

## 2018-03-13 ENCOUNTER — Inpatient Hospital Stay (HOSPITAL_COMMUNITY): Payer: Medicaid Other | Admitting: Emergency Medicine

## 2018-03-13 ENCOUNTER — Encounter (HOSPITAL_COMMUNITY): Payer: Self-pay | Admitting: *Deleted

## 2018-03-13 ENCOUNTER — Other Ambulatory Visit: Payer: Self-pay

## 2018-03-13 ENCOUNTER — Inpatient Hospital Stay (HOSPITAL_COMMUNITY): Payer: Medicaid Other | Admitting: Anesthesiology

## 2018-03-13 ENCOUNTER — Inpatient Hospital Stay (HOSPITAL_COMMUNITY)
Admission: RE | Admit: 2018-03-13 | Discharge: 2018-03-15 | DRG: 254 | Disposition: A | Discharge status: Home / Self Care | Payer: Medicaid Other | Source: Ambulatory Visit | Attending: Surgery | Admitting: Surgery

## 2018-03-13 DIAGNOSIS — Z800 Family history of malignant neoplasm of digestive organs: Secondary | ICD-10-CM | POA: Diagnosis not present

## 2018-03-13 DIAGNOSIS — Z7902 Long term (current) use of antithrombotics/antiplatelets: Secondary | ICD-10-CM | POA: Diagnosis not present

## 2018-03-13 DIAGNOSIS — J449 Chronic obstructive pulmonary disease, unspecified: Secondary | ICD-10-CM | POA: Diagnosis present

## 2018-03-13 DIAGNOSIS — I10 Essential (primary) hypertension: Secondary | ICD-10-CM | POA: Diagnosis present

## 2018-03-13 DIAGNOSIS — I70211 Atherosclerosis of native arteries of extremities with intermittent claudication, right leg: Secondary | ICD-10-CM | POA: Diagnosis not present

## 2018-03-13 DIAGNOSIS — Z833 Family history of diabetes mellitus: Secondary | ICD-10-CM | POA: Diagnosis not present

## 2018-03-13 DIAGNOSIS — Z885 Allergy status to narcotic agent status: Secondary | ICD-10-CM

## 2018-03-13 DIAGNOSIS — Z794 Long term (current) use of insulin: Secondary | ICD-10-CM | POA: Diagnosis not present

## 2018-03-13 DIAGNOSIS — I255 Ischemic cardiomyopathy: Secondary | ICD-10-CM | POA: Diagnosis present

## 2018-03-13 DIAGNOSIS — E1165 Type 2 diabetes mellitus with hyperglycemia: Secondary | ICD-10-CM | POA: Diagnosis present

## 2018-03-13 DIAGNOSIS — F17210 Nicotine dependence, cigarettes, uncomplicated: Secondary | ICD-10-CM | POA: Diagnosis present

## 2018-03-13 DIAGNOSIS — K219 Gastro-esophageal reflux disease without esophagitis: Secondary | ICD-10-CM | POA: Diagnosis present

## 2018-03-13 DIAGNOSIS — Z801 Family history of malignant neoplasm of trachea, bronchus and lung: Secondary | ICD-10-CM | POA: Diagnosis not present

## 2018-03-13 DIAGNOSIS — E782 Mixed hyperlipidemia: Secondary | ICD-10-CM | POA: Diagnosis present

## 2018-03-13 DIAGNOSIS — Z8674 Personal history of sudden cardiac arrest: Secondary | ICD-10-CM

## 2018-03-13 DIAGNOSIS — E1151 Type 2 diabetes mellitus with diabetic peripheral angiopathy without gangrene: Principal | ICD-10-CM | POA: Diagnosis present

## 2018-03-13 DIAGNOSIS — I2510 Atherosclerotic heart disease of native coronary artery without angina pectoris: Secondary | ICD-10-CM | POA: Diagnosis present

## 2018-03-13 DIAGNOSIS — Z955 Presence of coronary angioplasty implant and graft: Secondary | ICD-10-CM

## 2018-03-13 DIAGNOSIS — I252 Old myocardial infarction: Secondary | ICD-10-CM

## 2018-03-13 DIAGNOSIS — Z7982 Long term (current) use of aspirin: Secondary | ICD-10-CM | POA: Diagnosis not present

## 2018-03-13 DIAGNOSIS — I739 Peripheral vascular disease, unspecified: Secondary | ICD-10-CM | POA: Diagnosis present

## 2018-03-13 DIAGNOSIS — Z9889 Other specified postprocedural states: Secondary | ICD-10-CM | POA: Diagnosis not present

## 2018-03-13 HISTORY — PX: GROIN DISSECTION: SHX5250

## 2018-03-13 HISTORY — PX: FEMORAL-POPLITEAL BYPASS GRAFT: SHX937

## 2018-03-13 HISTORY — PX: ENDARTERECTOMY FEMORAL: SHX5804

## 2018-03-13 LAB — GLUCOSE, CAPILLARY
GLUCOSE-CAPILLARY: 102 mg/dL — AB (ref 65–99)
GLUCOSE-CAPILLARY: 110 mg/dL — AB (ref 65–99)
Glucose-Capillary: 155 mg/dL — ABNORMAL HIGH (ref 65–99)
Glucose-Capillary: 121 mg/dL — ABNORMAL HIGH (ref 65–99)
Glucose-Capillary: 127 mg/dL — ABNORMAL HIGH (ref 65–99)
Glucose-Capillary: 176 mg/dL — ABNORMAL HIGH (ref 65–99)

## 2018-03-13 LAB — CBC
HCT: 43.9 % (ref 39.0–52.0)
Hemoglobin: 14.5 g/dL (ref 13.0–17.0)
MCH: 32.1 pg (ref 26.0–34.0)
MCHC: 33 g/dL (ref 30.0–36.0)
MCV: 97.1 fL (ref 78.0–100.0)
PLATELETS: 179 10*3/uL (ref 150–400)
RBC: 4.52 MIL/uL (ref 4.22–5.81)
RDW: 14.2 % (ref 11.5–15.5)
WBC: 12 10*3/uL — AB (ref 4.0–10.5)

## 2018-03-13 LAB — POCT I-STAT 4, (NA,K, GLUC, HGB,HCT)
GLUCOSE: 119 mg/dL — AB (ref 65–99)
HEMATOCRIT: 41 % (ref 39.0–52.0)
HEMOGLOBIN: 13.9 g/dL (ref 13.0–17.0)
Potassium: 4.2 mmol/L (ref 3.5–5.1)
Sodium: 140 mmol/L (ref 135–145)

## 2018-03-13 LAB — CREATININE, SERUM
Creatinine, Ser: 1.04 mg/dL (ref 0.61–1.24)
GFR calc non Af Amer: >60 mL/min (ref >60)

## 2018-03-13 SURGERY — ENDARTERECTOMY, FEMORAL
Anesthesia: General | Site: Knee | Laterality: Right

## 2018-03-13 MED ORDER — TRAZODONE HCL 100 MG PO TABS: 100.0000 mg | ORAL_TABLET | Freq: Every evening | ORAL | Status: DC | PRN

## 2018-03-13 MED ORDER — SUGAMMADEX SODIUM 200 MG/2ML IV SOLN
INTRAVENOUS | Status: AC
  Filled 2018-03-13: qty 2

## 2018-03-13 MED ORDER — ROCURONIUM BROMIDE 100 MG/10ML IV SOLN
INTRAVENOUS | Status: DC | PRN
  Administered 2018-03-13: 50 mg via INTRAVENOUS
  Administered 2018-03-13: 10 mg via INTRAVENOUS
  Administered 2018-03-13 (×2): 20 mg via INTRAVENOUS
  Administered 2018-03-13: 10 mg via INTRAVENOUS
  Administered 2018-03-13 (×2): 20 mg via INTRAVENOUS

## 2018-03-13 MED ORDER — ONDANSETRON HCL 4 MG/2ML IJ SOLN
INTRAMUSCULAR | Status: DC | PRN
  Administered 2018-03-13: 4 mg via INTRAVENOUS

## 2018-03-13 MED ORDER — BISACODYL 10 MG RE SUPP: 10.0000 mg | Freq: Every day | RECTAL | Status: DC | PRN

## 2018-03-13 MED ORDER — FENTANYL CITRATE (PF) 100 MCG/2ML IJ SOLN
INTRAMUSCULAR | Status: AC
  Filled 2018-03-13: qty 2

## 2018-03-13 MED ORDER — ROCURONIUM BROMIDE 10 MG/ML (PF) SYRINGE
PREFILLED_SYRINGE | INTRAVENOUS | Status: AC
  Filled 2018-03-13: qty 15

## 2018-03-13 MED ORDER — POLYETHYLENE GLYCOL 3350 17 G PO PACK: 17.0000 g | PACK | Freq: Every day | ORAL | Status: DC | PRN

## 2018-03-13 MED ORDER — FENTANYL CITRATE (PF) 250 MCG/5ML IJ SOLN
INTRAMUSCULAR | Status: AC
  Filled 2018-03-13: qty 5

## 2018-03-13 MED ORDER — HEPARIN SODIUM (PORCINE) 1000 UNIT/ML IJ SOLN
INTRAMUSCULAR | Status: DC | PRN
  Administered 2018-03-13: 9000 [IU] via INTRAVENOUS
  Administered 2018-03-13: 1000 [IU] via INTRAVENOUS

## 2018-03-13 MED ORDER — SUGAMMADEX SODIUM 200 MG/2ML IV SOLN
INTRAVENOUS | Status: DC | PRN
  Administered 2018-03-13: 200 mg via INTRAVENOUS

## 2018-03-13 MED ORDER — DEXTROSE 5 % IV SOLN
INTRAVENOUS | Status: DC | PRN
  Administered 2018-03-13: 50 ug/min via INTRAVENOUS

## 2018-03-13 MED ORDER — SODIUM CHLORIDE 0.9 % IV SOLN: INTRAVENOUS | Status: DC

## 2018-03-13 MED ORDER — OXYCODONE HCL 5 MG PO TABS
5.0000 mg | ORAL_TABLET | Freq: Once | ORAL | Status: AC | PRN
  Administered 2018-03-13: 5 mg via ORAL

## 2018-03-13 MED ORDER — PHENOL 1.4 % MT LIQD: 1.0000 | OROMUCOSAL | Status: DC | PRN

## 2018-03-13 MED ORDER — CEFAZOLIN SODIUM-DEXTROSE 2-4 GM/100ML-% IV SOLN
INTRAVENOUS | Status: AC
  Filled 2018-03-13: qty 100

## 2018-03-13 MED ORDER — CHLORHEXIDINE GLUCONATE 4 % EX LIQD: 60.0000 mL | Freq: Once | CUTANEOUS | Status: DC

## 2018-03-13 MED ORDER — PHENYLEPHRINE 40 MCG/ML (10ML) SYRINGE FOR IV PUSH (FOR BLOOD PRESSURE SUPPORT)
PREFILLED_SYRINGE | INTRAVENOUS | Status: AC
  Filled 2018-03-13: qty 20

## 2018-03-13 MED ORDER — PROTAMINE SULFATE 10 MG/ML IV SOLN
INTRAVENOUS | Status: DC | PRN
  Administered 2018-03-13: 50 mg via INTRAVENOUS

## 2018-03-13 MED ORDER — CEFAZOLIN SODIUM-DEXTROSE 2-4 GM/100ML-% IV SOLN
2.0000 g | Freq: Three times a day (TID) | INTRAVENOUS | Status: AC
  Administered 2018-03-14 (×2): 2 g via INTRAVENOUS
  Filled 2018-03-13 (×2): qty 100

## 2018-03-13 MED ORDER — NITROGLYCERIN 0.4 MG SL SUBL: 0.4000 mg | SUBLINGUAL_TABLET | SUBLINGUAL | Status: DC | PRN

## 2018-03-13 MED ORDER — METOPROLOL TARTRATE 5 MG/5ML IV SOLN: 2.0000 mg | INTRAVENOUS | Status: DC | PRN

## 2018-03-13 MED ORDER — GUAIFENESIN-DM 100-10 MG/5ML PO SYRP: 15.0000 mL | ORAL_SOLUTION | ORAL | Status: DC | PRN

## 2018-03-13 MED ORDER — MORPHINE SULFATE (PF) 2 MG/ML IV SOLN
2.0000 mg | INTRAVENOUS | Status: DC | PRN
  Administered 2018-03-15: 2 mg via INTRAVENOUS
  Filled 2018-03-13: qty 1

## 2018-03-13 MED ORDER — ACETAMINOPHEN 325 MG PO TABS: 325.0000 mg | ORAL_TABLET | ORAL | Status: DC | PRN

## 2018-03-13 MED ORDER — ENALAPRIL MALEATE 10 MG PO TABS
5.0000 mg | ORAL_TABLET | Freq: Two times a day (BID) | ORAL | Status: DC
  Administered 2018-03-13 – 2018-03-15 (×4): 5 mg via ORAL
  Filled 2018-03-13 (×4): qty 1

## 2018-03-13 MED ORDER — 0.9 % SODIUM CHLORIDE (POUR BTL) OPTIME
TOPICAL | Status: DC | PRN
  Administered 2018-03-13: 2000 mL

## 2018-03-13 MED ORDER — MAGNESIUM SULFATE 2 GM/50ML IV SOLN
2.0000 g | Freq: Every day | INTRAVENOUS | Status: DC | PRN
  Filled 2018-03-13: qty 50

## 2018-03-13 MED ORDER — ISOSORBIDE MONONITRATE ER 30 MG PO TB24
30.0000 mg | ORAL_TABLET | Freq: Every day | ORAL | Status: DC
  Administered 2018-03-14 – 2018-03-15 (×2): 30 mg via ORAL
  Filled 2018-03-13 (×2): qty 1

## 2018-03-13 MED ORDER — OXYCODONE HCL 5 MG PO TABS
ORAL_TABLET | ORAL | Status: AC
  Filled 2018-03-13: qty 1

## 2018-03-13 MED ORDER — HEPARIN SODIUM (PORCINE) 5000 UNIT/ML IJ SOLN
5000.0000 [IU] | Freq: Three times a day (TID) | INTRAMUSCULAR | Status: DC
  Administered 2018-03-14 – 2018-03-15 (×3): 5000 [IU] via SUBCUTANEOUS
  Filled 2018-03-13 (×3): qty 1

## 2018-03-13 MED ORDER — FENTANYL CITRATE (PF) 100 MCG/2ML IJ SOLN
INTRAMUSCULAR | Status: DC | PRN
  Administered 2018-03-13: 100 ug via INTRAVENOUS
  Administered 2018-03-13 (×3): 50 ug via INTRAVENOUS
  Administered 2018-03-13 (×2): 100 ug via INTRAVENOUS
  Administered 2018-03-13: 50 ug via INTRAVENOUS

## 2018-03-13 MED ORDER — ONDANSETRON HCL 4 MG/2ML IJ SOLN
4.0000 mg | Freq: Four times a day (QID) | INTRAMUSCULAR | Status: DC | PRN
  Administered 2018-03-13: 4 mg via INTRAVENOUS
  Filled 2018-03-13: qty 2

## 2018-03-13 MED ORDER — IODIXANOL 320 MG/ML IV SOLN
INTRAVENOUS | Status: DC | PRN
Start: 2018-03-13 — End: 2018-03-13
  Administered 2018-03-13: 100 mL via INTRAVENOUS

## 2018-03-13 MED ORDER — PROMETHAZINE HCL 25 MG/ML IJ SOLN
INTRAMUSCULAR | Status: AC
  Filled 2018-03-13: qty 1

## 2018-03-13 MED ORDER — PANTOPRAZOLE SODIUM 40 MG PO TBEC
40.0000 mg | DELAYED_RELEASE_TABLET | Freq: Every day | ORAL | Status: DC
  Administered 2018-03-13 – 2018-03-15 (×3): 40 mg via ORAL
  Filled 2018-03-13 (×3): qty 1

## 2018-03-13 MED ORDER — ATORVASTATIN CALCIUM 40 MG PO TABS
40.0000 mg | ORAL_TABLET | Freq: Every day | ORAL | Status: DC
Start: 2018-03-14 — End: 2018-03-15
  Administered 2018-03-14: 40 mg via ORAL
  Filled 2018-03-13: qty 1

## 2018-03-13 MED ORDER — HYDRALAZINE HCL 20 MG/ML IJ SOLN: 5.0000 mg | INTRAMUSCULAR | Status: DC | PRN

## 2018-03-13 MED ORDER — MIDAZOLAM HCL 5 MG/5ML IJ SOLN
INTRAMUSCULAR | Status: DC | PRN
  Administered 2018-03-13: 2 mg via INTRAVENOUS

## 2018-03-13 MED ORDER — SODIUM CHLORIDE 0.9 % IV SOLN
INTRAVENOUS | Status: DC | PRN
  Administered 2018-03-13: 500 mL

## 2018-03-13 MED ORDER — INSULIN ASPART 100 UNIT/ML ~~LOC~~ SOLN
0.0000 [IU] | Freq: Three times a day (TID) | SUBCUTANEOUS | Status: DC
  Administered 2018-03-14 (×3): 3 [IU] via SUBCUTANEOUS
  Administered 2018-03-15: 2 [IU] via SUBCUTANEOUS
  Administered 2018-03-15: 3 [IU] via SUBCUTANEOUS

## 2018-03-13 MED ORDER — OXYCODONE-ACETAMINOPHEN 5-325 MG PO TABS
1.0000 | ORAL_TABLET | ORAL | Status: DC | PRN
  Administered 2018-03-13 – 2018-03-14 (×6): 2 via ORAL
  Administered 2018-03-15: 1 via ORAL
  Administered 2018-03-15 (×2): 2 via ORAL
  Filled 2018-03-13 (×2): qty 2
  Filled 2018-03-13: qty 1
  Filled 2018-03-13 (×6): qty 2

## 2018-03-13 MED ORDER — LACTATED RINGERS IV SOLN
INTRAVENOUS | Status: DC | PRN
  Administered 2018-03-13 (×3): via INTRAVENOUS

## 2018-03-13 MED ORDER — FENTANYL CITRATE (PF) 100 MCG/2ML IJ SOLN
INTRAMUSCULAR | Status: AC
  Administered 2018-03-13: 50 ug via INTRAVENOUS
  Filled 2018-03-13: qty 2

## 2018-03-13 MED ORDER — LIDOCAINE 2% (20 MG/ML) 5 ML SYRINGE
INTRAMUSCULAR | Status: AC
  Filled 2018-03-13: qty 10

## 2018-03-13 MED ORDER — CYCLOBENZAPRINE HCL 10 MG PO TABS
10.0000 mg | ORAL_TABLET | Freq: Two times a day (BID) | ORAL | Status: DC | PRN
Start: 2018-03-13 — End: 2018-03-15

## 2018-03-13 MED ORDER — PROMETHAZINE HCL 25 MG/ML IJ SOLN
6.2500 mg | INTRAMUSCULAR | Status: DC | PRN
  Administered 2018-03-13: 6.25 mg via INTRAVENOUS

## 2018-03-13 MED ORDER — ONDANSETRON HCL 4 MG/2ML IJ SOLN
INTRAMUSCULAR | Status: AC
  Filled 2018-03-13: qty 2

## 2018-03-13 MED ORDER — ACETAMINOPHEN 325 MG RE SUPP: 325.0000 mg | RECTAL | Status: DC | PRN

## 2018-03-13 MED ORDER — DOCUSATE SODIUM 100 MG PO CAPS
100.0000 mg | ORAL_CAPSULE | Freq: Every day | ORAL | Status: DC
  Administered 2018-03-14 – 2018-03-15 (×2): 100 mg via ORAL
  Filled 2018-03-13 (×2): qty 1

## 2018-03-13 MED ORDER — FENTANYL CITRATE (PF) 100 MCG/2ML IJ SOLN
25.0000 ug | INTRAMUSCULAR | Status: DC | PRN
  Administered 2018-03-13 (×3): 50 ug via INTRAVENOUS

## 2018-03-13 MED ORDER — ASPIRIN EC 81 MG PO TBEC
81.0000 mg | DELAYED_RELEASE_TABLET | Freq: Every day | ORAL | Status: DC
  Administered 2018-03-14 – 2018-03-15 (×2): 81 mg via ORAL
  Filled 2018-03-13 (×2): qty 1

## 2018-03-13 MED ORDER — ALBUMIN HUMAN 5 % IV SOLN
INTRAVENOUS | Status: DC | PRN
  Administered 2018-03-13: 17:00:00 via INTRAVENOUS

## 2018-03-13 MED ORDER — CEFAZOLIN SODIUM-DEXTROSE 2-4 GM/100ML-% IV SOLN
2.0000 g | INTRAVENOUS | Status: AC
  Administered 2018-03-13 (×2): 2 g via INTRAVENOUS

## 2018-03-13 MED ORDER — SODIUM CHLORIDE 0.9 % IV SOLN
INTRAVENOUS | Status: DC
  Administered 2018-03-13 – 2018-03-14 (×2): via INTRAVENOUS

## 2018-03-13 MED ORDER — CALCIUM CARBONATE ANTACID 500 MG PO CHEW
2.0000 | CHEWABLE_TABLET | Freq: Every day | ORAL | Status: DC | PRN
  Administered 2018-03-13 – 2018-03-14 (×2): 400 mg via ORAL
  Filled 2018-03-13 (×2): qty 2

## 2018-03-13 MED ORDER — PROPOFOL 10 MG/ML IV BOLUS
INTRAVENOUS | Status: DC | PRN
  Administered 2018-03-13: 160 mg via INTRAVENOUS

## 2018-03-13 MED ORDER — LABETALOL HCL 5 MG/ML IV SOLN
INTRAVENOUS | Status: AC
  Filled 2018-03-13: qty 4

## 2018-03-13 MED ORDER — LABETALOL HCL 5 MG/ML IV SOLN
10.0000 mg | INTRAVENOUS | Status: DC | PRN
  Administered 2018-03-13 (×2): 10 mg via INTRAVENOUS

## 2018-03-13 MED ORDER — OXYCODONE HCL 5 MG/5ML PO SOLN: 5.0000 mg | Freq: Once | ORAL | Status: AC | PRN

## 2018-03-13 MED ORDER — HEMOSTATIC AGENTS (NO CHARGE) OPTIME
TOPICAL | Status: DC | PRN
  Administered 2018-03-13: 1 via TOPICAL

## 2018-03-13 MED ORDER — POTASSIUM CHLORIDE CRYS ER 20 MEQ PO TBCR: 20.0000 meq | EXTENDED_RELEASE_TABLET | Freq: Every day | ORAL | Status: DC | PRN

## 2018-03-13 MED ORDER — CARVEDILOL 6.25 MG PO TABS
6.2500 mg | ORAL_TABLET | Freq: Two times a day (BID) | ORAL | Status: DC
  Administered 2018-03-13 – 2018-03-15 (×4): 6.25 mg via ORAL
  Filled 2018-03-13 (×4): qty 1

## 2018-03-13 MED ORDER — SODIUM CHLORIDE 0.9 % IV SOLN: 500.0000 mL | Freq: Once | INTRAVENOUS | Status: DC | PRN

## 2018-03-13 MED ORDER — LIDOCAINE HCL (CARDIAC) 20 MG/ML IV SOLN
INTRAVENOUS | Status: DC | PRN
  Administered 2018-03-13: 30 mg via INTRAVENOUS

## 2018-03-13 MED ORDER — LACTATED RINGERS IV SOLN
INTRAVENOUS | Status: DC
  Administered 2018-03-13: 08:00:00 via INTRAVENOUS

## 2018-03-13 MED ORDER — MIDAZOLAM HCL 2 MG/2ML IJ SOLN
INTRAMUSCULAR | Status: AC
  Filled 2018-03-13: qty 2

## 2018-03-13 MED ORDER — ALUM & MAG HYDROXIDE-SIMETH 200-200-20 MG/5ML PO SUSP: 15.0000 mL | ORAL | Status: DC | PRN

## 2018-03-13 SURGICAL SUPPLY — 66 items
BANDAGE ESMARK 6X9 LF (GAUZE/BANDAGES/DRESSINGS) ×2 IMPLANT
BNDG ESMARK 6X9 LF (GAUZE/BANDAGES/DRESSINGS) ×3
CANISTER SUCT 3000ML PPV (MISCELLANEOUS) ×3 IMPLANT
CANNULA VESSEL 3MM 2 BLNT TIP (CANNULA) ×3 IMPLANT
CLIP LIGATING EXTRA MED SLVR (CLIP) ×3 IMPLANT
CLIP LIGATING EXTRA SM BLUE (MISCELLANEOUS) ×3 IMPLANT
CLIP VESOCCLUDE MED 24/CT (CLIP) ×3 IMPLANT
CLIP VESOCCLUDE SM WIDE 24/CT (CLIP) ×3 IMPLANT
CUFF TOURNIQUET SINGLE 24IN (TOURNIQUET CUFF) IMPLANT
CUFF TOURNIQUET SINGLE 34IN LL (TOURNIQUET CUFF) ×3 IMPLANT
CUFF TOURNIQUET SINGLE 44IN (TOURNIQUET CUFF) IMPLANT
DERMABOND ADVANCED (GAUZE/BANDAGES/DRESSINGS) ×3
DERMABOND ADVANCED .7 DNX12 (GAUZE/BANDAGES/DRESSINGS) ×6 IMPLANT
DRAIN CHANNEL 15F RND FF W/TCR (WOUND CARE) IMPLANT
DRAPE HALF SHEET 40X57 (DRAPES) IMPLANT
DRAPE X-RAY CASS 24X20 (DRAPES) IMPLANT
DRESSING PREVENA PLUS CUSTOM (GAUZE/BANDAGES/DRESSINGS) ×2 IMPLANT
DRSG PREVENA PLUS CUSTOM (GAUZE/BANDAGES/DRESSINGS) ×3
ELECT CAUTERY BLADE 6.4 (BLADE) ×3 IMPLANT
ELECT REM PT RETURN 9FT ADLT (ELECTROSURGICAL) ×3
ELECTRODE REM PT RTRN 9FT ADLT (ELECTROSURGICAL) ×2 IMPLANT
EVACUATOR SILICONE 100CC (DRAIN) IMPLANT
GLOVE BIO SURGEON STRL SZ 6.5 (GLOVE) ×18 IMPLANT
GLOVE BIO SURGEON STRL SZ7.5 (GLOVE) ×3 IMPLANT
GLOVE BIOGEL PI IND STRL 7.5 (GLOVE) ×2 IMPLANT
GLOVE BIOGEL PI INDICATOR 7.5 (GLOVE) ×1
GLOVE SS BIOGEL STRL SZ 7.5 (GLOVE) ×2 IMPLANT
GLOVE SUPERSENSE BIOGEL SZ 7.5 (GLOVE) ×1
GLOVE SURG SS PI 7.5 STRL IVOR (GLOVE) ×3 IMPLANT
GOWN STRL REUS W/ TWL LRG LVL3 (GOWN DISPOSABLE) ×10 IMPLANT
GOWN STRL REUS W/ TWL XL LVL3 (GOWN DISPOSABLE) ×4 IMPLANT
GOWN STRL REUS W/TWL LRG LVL3 (GOWN DISPOSABLE) ×5
GOWN STRL REUS W/TWL XL LVL3 (GOWN DISPOSABLE) ×2
HEMOSTAT SNOW SURGICEL 2X4 (HEMOSTASIS) IMPLANT
INSERT FOGARTY SM (MISCELLANEOUS) IMPLANT
KIT BASIN OR (CUSTOM PROCEDURE TRAY) ×3 IMPLANT
KIT TURNOVER KIT B (KITS) ×3 IMPLANT
MARKER GRAFT CORONARY BYPASS (MISCELLANEOUS) IMPLANT
NS IRRIG 1000ML POUR BTL (IV SOLUTION) ×6 IMPLANT
PACK PERIPHERAL VASCULAR (CUSTOM PROCEDURE TRAY) ×3 IMPLANT
PAD ARMBOARD 7.5X6 YLW CONV (MISCELLANEOUS) ×6 IMPLANT
PENCIL BUTTON HOLSTER BLD 10FT (ELECTRODE) ×3 IMPLANT
SET COLLECT BLD 21X3/4 12 (NEEDLE) IMPLANT
SPONGE LAP 18X18 X RAY DECT (DISPOSABLE) ×9 IMPLANT
STOPCOCK 4 WAY LG BORE MALE ST (IV SETS) IMPLANT
SUT ETHILON 3 0 PS 1 (SUTURE) IMPLANT
SUT GORETEX 6.0 TT13 (SUTURE) IMPLANT
SUT GORETEX 6.0 TT9 (SUTURE) IMPLANT
SUT PROLENE 5 0 C 1 24 (SUTURE) ×18 IMPLANT
SUT PROLENE 6 0 BV (SUTURE) ×9 IMPLANT
SUT PROLENE 7 0 BV 1 (SUTURE) IMPLANT
SUT PROLENE 7 0 BV1 MDA (SUTURE) ×3 IMPLANT
SUT SILK 2 0 SH (SUTURE) ×3 IMPLANT
SUT SILK 3 0 (SUTURE) ×1
SUT SILK 3-0 18XBRD TIE 12 (SUTURE) ×2 IMPLANT
SUT VIC AB 2-0 CT1 27 (SUTURE) ×2
SUT VIC AB 2-0 CT1 TAPERPNT 27 (SUTURE) ×4 IMPLANT
SUT VIC AB 3-0 SH 27 (SUTURE) ×3
SUT VIC AB 3-0 SH 27X BRD (SUTURE) ×6 IMPLANT
SUT VIC AB 4-0 PS2 27 (SUTURE) ×3 IMPLANT
SUT VICRYL 4-0 PS2 18IN ABS (SUTURE) ×6 IMPLANT
TOWEL GREEN STERILE (TOWEL DISPOSABLE) ×3 IMPLANT
TRAY FOLEY MTR SLVR 16FR STAT (SET/KITS/TRAYS/PACK) ×3 IMPLANT
TUBING EXTENTION W/L.L. (IV SETS) IMPLANT
UNDERPAD 30X30 (UNDERPADS AND DIAPERS) ×3 IMPLANT
WATER STERILE IRR 1000ML POUR (IV SOLUTION) ×3 IMPLANT

## 2018-03-13 NOTE — Anesthesia Procedure Notes (Signed)
Procedure Name: Intubation Date/Time: 03/13/2018 2:57 PM Performed by: Eligha Bridegroom, CRNA Pre-anesthesia Checklist: Patient identified, Emergency Drugs available, Suction available, Patient being monitored and Timeout performed Patient Re-evaluated:Patient Re-evaluated prior to induction Oxygen Delivery Method: Circle system utilized Preoxygenation: Pre-oxygenation with 100% oxygen Ventilation: Mask ventilation without difficulty Laryngoscope Size: Mac and 3 Grade View: Grade I Tube type: Oral Tube size: 7.5 mm Airway Equipment and Method: Stylet Secured at: 21 cm Tube secured with: Tape Dental Injury: Teeth and Oropharynx as per pre-operative assessment

## 2018-03-13 NOTE — Anesthesia Procedure Notes (Signed)
Arterial Line Insertion Performed by: Gwenyth Allegra, CRNA, CRNA  Preanesthetic checklist: patient identified, IV checked, site marked, risks and benefits discussed, surgical consent, monitors and equipment checked, pre-op evaluation and timeout performed radial was placed Catheter size: 20 G Hand hygiene performed  and maximum sterile barriers used   Attempts: 1 Procedure performed without using ultrasound guided technique. Following insertion, Biopatch and dressing applied. Post procedure assessment: normal  Patient tolerated the procedure well with no immediate complications.

## 2018-03-13 NOTE — Progress Notes (Signed)
Report received from Maria,RN

## 2018-03-13 NOTE — Op Note (Signed)
Patient name: Ryan [name] MRN: [id number] DOB: Aug 28, 1966 Sex: male  03/13/2018 Pre-operative Diagnosis: Right leg claudication Post-operative diagnosis:  Same Surgeon:  Durene Cal Assistants:  Gretta Began, Ma Samantha Rhinett Eveland,  Procedure:   #1: Right femoral to above-knee popliteal artery bypass graft with ipsilateral non-reversed translocated saphenous vein   #2: Redo right common femoral artery exposure   #3: Right femoral endarterectomy   Placement of right groin Praveena wound VAC Anesthesia: General Blood Loss:  200 Specimens: None  Findings:  neointima removed from common femoral artery and distal Aortobifemoral bypass graft.  The proximal anastomosis begins at the distal fluid of the aortobifemoral graft and goes down onto the proximal profundofemoral artery.  The vein at its distal extent measured approximately 3 mm.  The above-knee popliteal artery was soft and relatively disease-free.  He had a brisk graft dependent Doppler signal in the posterior tibial artery which was the strongest artery.  There is also a graft dependent signal in the anterior tibial artery.  Indications: The patient has previously undergone an aortobifemoral bypass graft.  He is now having severe claudication in his right leg.  His surgery was originally scheduled 6-8 months ago however it was canceled because of poorly controlled diabetes.  The patient went on to have a MI.  He has now been cleared to proceed with bypass surgery.  I got a CT angiogram in the preoperative period to make sure there were no significant changes in his vascular disease.  He has been off his antiplatelet medication for a week.  Procedure:  The patient was identified in the holding area and taken to [facility] OR ROOM 16  The patient was then placed supine on the table. general anesthesia was administered.  The patient was prepped and draped in the usual sterile fashion.  A time out was called and antibiotics were administered.   The patient's previous longitudinal right groin incision was opened with a #10 blade.  Cautery was used about subcutaneous tissue.  I identified the right limb of the aortobifemoral graft and dissected this out.  I then dissected free the common femoral artery, profundofemoral and superficial femoral artery.  The common femoral artery distal to the aortobifemoral graft was soft and relatively disease-free.  The saphenous vein was then harvested through several skip incisions in the right leg.  In the distal incision of the vein harvest, I also exposed the above-knee popliteal artery which was soft and relatively disease-free.  The vein was then ligated distally with 2-0 silk tie.  The saphenofemoral junction was then ligated with 5-0 Prolene in 2 layers.  A subsartorial tunnel was then created with a curved Gore tunneler.  The vein was then prepared on the back table.  It distended nicely.  It was approximately 5 x 6 mm proximally and tapered down to approximately 3 mm at the knee.  It was marked for orientation.  The patient was then fully heparinized.  A Henley clamp was used to occlude both the aortobifemoral right limb graft and common femoral artery.  A Henley clamp was used to occlude the profundofemoral artery.  I made a longitudinal arteriotomy in the common femoral artery distal to the right limb of the aortobifemoral graft.  I extended this up onto the anterior surface of the right limb of the graft.  There was a focal piece of neointima creating approximately a 50% stenosis in the distal aspect of the aortobifemoral graft.  This was removed.  The vein was then  brought onto the table.  It was spatulated to fit the size of the arteriotomy and a running anastomosis was created with 5-0 Prolene.  Once this was completed the clamps were released.  A valvulotome was then used to lyse the valves.  2 passes were made.  There was excellent pulsatile flow through the graft.  The graft was then brought through the  previously created tunnel.  I made sure to maintain proper orientation.  A web roll was placed in the upper thigh followed by tourniquet.  An Esmarch was used to exsanguinate the leg and the tourniquet was taken to 250 mm of pressure.  An arteriotomy was made in the above-knee popliteal artery which was extended longitudinally with Potts scissors.  The artery was healthy without significant plaque.  The vein was then pulled to the appropriate length and then spatulated to fit the size of the arteriotomy.  A running anastomosis was created with 6-0 Prolene.  Prior to completion, the tourniquet was let down and the appropriate flushing maneuvers were performed.  The anastomosis was then completed.  There was an excellent pulse within the bypass graft as well as brisk Doppler signals at the anastomosis and a excellent graft dependent Doppler signal in the posterior tibial artery.  There is a dampened graft dependent signal in the anterior tibial artery.  These were consistent with the patient's arteriogram.  I elected not to perform an arteriogram.  50 mg of protamine was administered.  Once hemostasis was satisfactory, the vein harvest incisions were closed with 2 layers of 3-0 Vicryl followed by Dermabond.  The above-knee arterial incision was closed by reapproximating the fascia with 2-0 Vicryl.  The subtendinous tissue was closed with 3-0 Vicryl followed by 4-0 Vicryl and skin.  The groin incision was then irrigated.  The femoral sheath was reapproximated with 2-0 Vicryl.  The subtendinous tissue was then closed with multiple layers of 3-0 Vicryl followed by 4-0 Vicryl and skin.  Dermabond was placed on the above-knee incision and a Praveena wound VAC was placed on the groin incision.  The patient was successfully extubated and taken to the recovery room in stable condition.   Disposition: To PACU stable   V. Durene Cal, M.D. [facility] [place] Office: [phone] Pager:   [phone]

## 2018-03-13 NOTE — Discharge Instructions (Signed)
 [facility] [place]  Discharge instructions  Lower Extremity Bypass Surgery  Please refer to the following instruction for your post-procedure care. Your surgeon or physician assistant will discuss any changes with you.  Activity  You are encouraged to walk as much as you can. You can slowly return to normal activities during the month after your surgery. Avoid strenuous activity and heavy lifting until your doctor tells you it's OK. Avoid activities such as vacuuming or swinging a golf club. Do not drive until your doctor give the OK and you are no longer taking prescription pain medications. It is also normal to have difficulty with sleep habits, eating and bowel movement after surgery. These will go away with time.  Bathing/Showering  Shower daily after you go home. Do not soak in a bathtub, hot tub, or swim until the incision heals completely.  Incision Care  Clean your incision with mild soap and water. Shower every day. Pat the area dry with a clean towel. You do not need a bandage unless otherwise instructed. Do not apply any ointments or creams to your incision. If you have open wounds you will be instructed how to care for them or a visiting nurse may be arranged for you. If you have staples or sutures along your incision they will be removed at your post-op appointment. You may have skin glue on your incision. Do not peel it off. It will come off on its own in about one week.  Wash the groin wound with soap and water daily and pat dry. (No tub bath-only shower)  Then put a dry gauze or washcloth in the groin to keep this area dry to help prevent wound infection.  Do this daily and as needed.  Do not use Vaseline or neosporin on your incisions.  Only use soap and water on your incisions and then protect and keep dry.  Diet  Resume your normal diet. There are no special food restrictions following this procedure. A low fat/ low cholesterol diet is  recommended for all patients with vascular disease. In order to heal from your surgery, it is CRITICAL to get adequate nutrition. Your body requires vitamins, minerals, and protein. Vegetables are the best source of vitamins and minerals. Vegetables also provide the perfect balance of protein. Processed food has little nutritional value, so try to avoid this.  Medications  Resume taking all your medications unless your doctor or physician assistant tells you not to. If your incision is causing pain, you may take over-the-counter pain relievers such as acetaminophen (Tylenol). If you were prescribed a stronger pain medication, please aware these medication can cause nausea and constipation. Prevent nausea by taking the medication with a snack or meal. Avoid constipation by drinking plenty of fluids and eating foods with high amount of fiber, such as fruits, vegetables, and grains. Take Colace 100 mg (an over-the-counter stool softener) twice a day as needed for constipation.  Do not take Tylenol if you are taking prescription pain medications.  Follow Up  Our office will schedule a follow up appointment 2-3 weeks following discharge.  Please call us immediately for any of the following conditions  Severe or worsening pain in your legs or feet while at rest or while walking Increase pain, redness, warmth, or drainage (pus) from your incision site(s) Fever of 101 degree or higher The swelling in your leg with the bypass suddenly worsens and becomes more painful than when you were in the hospital If you have   been instructed to feel your graft pulse then you should do so every day. If you can no longer feel this pulse, call the office immediately. Not all patients are given this instruction.  Leg swelling is common after leg bypass surgery.  The swelling should improve over a few months following surgery. To improve the swelling, you may elevate your legs above the level of your heart while you are  sitting or resting. Your surgeon or physician assistant may ask you to apply an ACE wrap or wear compression (TED) stockings to help to reduce swelling.  Reduce your risk of vascular disease  Stop smoking. If you would like help call [facility] at 1-800-QUIT-NOW (1-800-784-8669) or [place] at [phone].  Manage your cholesterol Maintain a desired weight Control your diabetes weight Control your diabetes Keep your blood pressure down  If you have any questions, please call the office at [phone]  

## 2018-03-13 NOTE — Transfer of Care (Signed)
Immediate Anesthesia Transfer of Care Note  Patient: Ryan [name]  Procedure(s) Performed: FEMORAL ENDARTERECTOMY RIGHT (Right Groin) FEMORAL-POPLITEAL ARTERY BYPASS WITH NON-REVERSE VEIN RIGHT (Right Knee) RE-DO COMMON FEMORAL ARTERY EXPOSURE (Right Groin)  Patient Location: PACU  Anesthesia Type:General  Level of Consciousness: awake, alert  and oriented  Airway & Oxygen Therapy: Patient Spontanous Breathing and Patient connected to nasal cannula oxygen  Post-op Assessment: Report given to RN and Post -op Vital signs reviewed and stable  Post vital signs: Reviewed and stable  Last Vitals:  Vitals Value Taken Time  BP 140/92 03/13/2018  6:25 PM  Temp    Pulse 108 03/13/2018  6:31 PM  Resp 25 03/13/2018  6:31 PM  SpO2 91 % 03/13/2018  6:31 PM  Vitals shown include unvalidated device data.  Last Pain:  Vitals:   03/13/18 0750  TempSrc: Oral         Complications: No apparent anesthesia complications

## 2018-03-13 NOTE — Progress Notes (Signed)
Foley removed and pt asking for a urinal.  Urinal placed at bedside.  Pt attempting to use it.

## 2018-03-13 NOTE — Interval H&P Note (Signed)
History and Physical Interval Note:  03/13/2018 8:38 AM  Ryan [name]  has presented today for surgery, with the diagnosis of PERIPHERAL ARTERY DISEASE  The various methods of treatment have been discussed with the patient and family. After consideration of risks, benefits and other options for treatment, the patient has consented to  Procedure(s): REDO FEMORAL ENDARTERECTOMY RIGHT (Right) BYPASS GRAFT FEMORAL-POPLITEAL ARTERY RIGHT (Right) as a surgical intervention .  The patient's history has been reviewed, patient examined, no change in status, stable for surgery.  I have reviewed the patient's chart and labs.  Questions were answered to the patient's satisfaction.     Durene Cal

## 2018-03-13 NOTE — Anesthesia Preprocedure Evaluation (Addendum)
Anesthesia Evaluation  Patient identified by MRN, date of birth, ID band Patient awake    Reviewed: Allergy & Precautions, H&P , NPO status , Patient's Chart, lab work & pertinent test results, reviewed documented beta blocker date and time   Airway Mallampati: II  TM Distance: >3 FB Neck ROM: Full    Dental  (+) Edentulous Upper, Dental Advisory Given   Pulmonary COPD, Current Smoker,    Pulmonary exam normal breath sounds clear to auscultation       Cardiovascular hypertension, Pt. on home beta blockers and Pt. on medications + CAD, + Past MI, + Cardiac Stents and + Peripheral Vascular Disease  Normal cardiovascular exam Rhythm:Regular Rate:Normal  '18 Cath (care everywhere) - 1. Acute occlusion of Prox LAD, just proxomial to old stents. (STEMI) 2. Mild disease elsewhere 3. Anteroapical/apical wall hypokinesis, LVEF 40% Successful PCI / Xience Drug Eluting Stent of the proximal Left Anterior Descending Coronary Artery, overlapping distally to prior LAD stents. Successful POBA to ostial Diag thru stent strut.  18 TTE (care everywhere) - Distal anterior, Anteroapical / apical wall hypokinesis. LVEF 35-40%. No significant valvular abnormalites noted  '18 EKG - NSR. Septal and lateral infarcts, age undetermined. T wave abnormality, consider anterior ischemia.     Neuro/Psych negative neurological ROS  negative psych ROS   GI/Hepatic Neg liver ROS, GERD  Medicated and Controlled,  Endo/Other  diabetes, Poorly Controlled, Type 2, Insulin DependentObesity  Renal/GU Hx nephrolithiasis     Musculoskeletal   Abdominal (+) + obese,   Peds  Hematology   Anesthesia Other Findings   Reproductive/Obstetrics                            Anesthesia Physical  Anesthesia Plan  ASA: III  Anesthesia Plan: General   Post-op Pain Management:    Induction: Intravenous  PONV Risk Score and Plan: 3 and  Treatment may vary due to age or medical condition, Ondansetron, Dexamethasone, Midazolam and Scopolamine patch - Pre-op  Airway Management Planned: Oral ETT  Additional Equipment: Arterial line  Intra-op Plan:   Post-operative Plan: Extubation in OR  Informed Consent: I have reviewed the patients History and Physical, chart, labs and discussed the procedure including the risks, benefits and alternatives for the proposed anesthesia with the patient or authorized representative who has indicated his/her understanding and acceptance.   Dental advisory given  Plan Discussed with: CRNA and Anesthesiologist  Anesthesia Plan Comments:         Anesthesia Quick Evaluation

## 2018-03-14 ENCOUNTER — Telehealth: Payer: Self-pay | Admitting: Surgery

## 2018-03-14 ENCOUNTER — Encounter (HOSPITAL_COMMUNITY): Payer: Self-pay | Admitting: Surgery

## 2018-03-14 ENCOUNTER — Inpatient Hospital Stay (HOSPITAL_COMMUNITY): Payer: Medicaid Other

## 2018-03-14 DIAGNOSIS — Z9889 Other specified postprocedural states: Secondary | ICD-10-CM

## 2018-03-14 LAB — BASIC METABOLIC PANEL
Anion gap: 10 (ref 5–15)
BUN: 15 mg/dL (ref 6–20)
CALCIUM: 8.6 mg/dL — AB (ref 8.9–10.3)
CO2: 26 mmol/L (ref 22–32)
CREATININE: 1.34 mg/dL — AB (ref 0.61–1.24)
Chloride: 101 mmol/L (ref 101–111)
GFR calc Af Amer: >60 mL/min (ref >60)
GFR, EST NON AFRICAN AMERICAN: 60 mL/min — AB (ref >60)
GLUCOSE: 222 mg/dL — AB (ref 65–99)
Potassium: 4.4 mmol/L (ref 3.5–5.1)
Sodium: 137 mmol/L (ref 135–145)

## 2018-03-14 LAB — CBC
HEMATOCRIT: 44.4 % (ref 39.0–52.0)
Hemoglobin: 14.9 g/dL (ref 13.0–17.0)
MCH: 32.7 pg (ref 26.0–34.0)
MCHC: 33.6 g/dL (ref 30.0–36.0)
MCV: 97.4 fL (ref 78.0–100.0)
PLATELETS: 190 10*3/uL (ref 150–400)
RBC: 4.56 MIL/uL (ref 4.22–5.81)
RDW: 14.5 % (ref 11.5–15.5)
WBC: 14.5 10*3/uL — AB (ref 4.0–10.5)

## 2018-03-14 LAB — GLUCOSE, CAPILLARY
GLUCOSE-CAPILLARY: 172 mg/dL — AB (ref 65–99)
Glucose-Capillary: 169 mg/dL — ABNORMAL HIGH (ref 65–99)
Glucose-Capillary: 155 mg/dL — ABNORMAL HIGH (ref 65–99)
Glucose-Capillary: 151 mg/dL — ABNORMAL HIGH (ref 65–99)

## 2018-03-14 NOTE — Evaluation (Signed)
Physical Therapy Evaluation Patient Details Name: Ryan [name] MRN: [id number] DOB: 04-27-1966 Today's Date: 03/14/2018   History of Present Illness  Pt is a 52 y/o male s/p R fem-pop. PMH including but not limited to PAD, HTN, DM and hx of MI.  Clinical Impression  Pt standing in room with RW and significant other present. Prior to admission, pt reported that he was independent with all functional mobility and ADLs. Pt currently able to perform transfers with modified independence and ambulate in hallway with RW and supervision for safety. Pt with reports of being "sore" in right groin area but does not limit his functional mobility at this time. No further acute PT needs identified. PT signing off.      Follow Up Recommendations No PT follow up    Equipment Recommendations  Rolling walker with 5" wheels    Recommendations for Other Services       Precautions / Restrictions Precautions Precautions: Fall Restrictions Weight Bearing Restrictions: No      Mobility  Bed Mobility               General bed mobility comments: pt presenting standing EOB with RW and significant other present  Transfers Overall transfer level: Modified independent Equipment used: Rolling walker (2 wheeled)                Ambulation/Gait Ambulation/Gait assistance: Supervision Ambulation Distance (Feet): 500 Feet Assistive device: Rolling walker (2 wheeled) Gait Pattern/deviations: Step-through pattern Gait velocity: could vary speed Gait velocity interpretation: at or above normal speed for age/gender General Gait Details: pt a bit impulsive and unsafe with RW at times, but no LOB or need for physical assistance  Stairs            Wheelchair Mobility    Modified Rankin (Stroke Patients Only)       Balance Overall balance assessment: Needs assistance Sitting-balance support: Feet supported Sitting balance-Leahy Scale: Good     Standing balance support: During  functional activity;No upper extremity supported Standing balance-Leahy Scale: Fair                               Pertinent Vitals/Pain Pain Assessment: Faces Faces Pain Scale: Hurts little more Pain Location: R groin Pain Descriptors / Indicators: Sore Pain Intervention(s): Monitored during session;Repositioned    Home Living Family/patient expects to be discharged to:: Private residence Living Arrangements: Spouse/significant other Available Help at Discharge: Family;Friend(s) Type of Home: House Home Access: Stairs to enter   Secretary/administrator of Steps: 2 Home Layout: One level Home Equipment: Shower seat      Prior Function Level of Independence: Independent               Hand Dominance        Extremity/Trunk Assessment   Upper Extremity Assessment Upper Extremity Assessment: Overall WFL for tasks assessed    Lower Extremity Assessment Lower Extremity Assessment: Overall WFL for tasks assessed    Cervical / Trunk Assessment Cervical / Trunk Assessment: Normal  Communication   Communication: No difficulties  Cognition Arousal/Alertness: Awake/alert Behavior During Therapy: WFL for tasks assessed/performed(frustrated) Overall Cognitive Status: Within Functional Limits for tasks assessed                                        General Comments      Exercises  Assessment/Plan    PT Assessment Patent does not need any further PT services  PT Problem List         PT Treatment Interventions      PT Goals (Current goals can be found in the Care Plan section)  Acute Rehab PT Goals Patient Stated Goal: return home ASAP    Frequency     Barriers to discharge        Co-evaluation               AM-PAC PT "6 Clicks" Daily Activity  Outcome Measure Difficulty turning over in bed (including adjusting bedclothes, sheets and blankets)?: None Difficulty moving from lying on back to sitting on the side of  the bed? : None Difficulty sitting down on and standing up from a chair with arms (e.g., wheelchair, bedside commode, etc,.)?: None Help needed moving to and from a bed to chair (including a wheelchair)?: None Help needed walking in hospital room?: None Help needed climbing 3-5 steps with a railing? : A Little 6 Click Score: 23    End of Session   Activity Tolerance: Patient tolerated treatment well Patient left: in chair;with call bell/phone within reach;with family/visitor present Nurse Communication: Mobility status PT Visit Diagnosis: Other abnormalities of gait and mobility (R26.89)    Time: 1610-9604 PT Time Calculation (min) (ACUTE ONLY): 19 min   Charges:   PT Evaluation $PT Eval Moderate Complexity: 1 Mod     PT G Codes:        [place], PT, DPT [phone]   Alessandra Bevels Cohan Stipes 03/14/2018, 11:18 AM

## 2018-03-14 NOTE — Telephone Encounter (Signed)
-----   Message from Sharee Pimple, RN sent at 03/14/2018 10:56 AM EDT ----- Regarding: 2-3 weeks    ----- Message ----- From: Garth Schlatter Sent: 03/13/2018   6:08 PM To: [facility] Charge Pool  S/p right fem pop bypass 03/13/18.  F/u with Dr. Myra Gianotti in 2-3 weeks.  Thanks

## 2018-03-14 NOTE — Evaluation (Addendum)
Occupational Therapy Evaluation Patient Details Name: Ryan [name] MRN: [id number] DOB: 1965-12-26 Today's Date: 03/14/2018    History of Present Illness Pt is a 52 y/o male s/p R fem-pop. PMH including but not limited to PAD, HTN, DM and hx of MI.   Clinical Impression   Pt is at Mod I level with ADLs and ADL mobility and will have assist at home prn. All education completed and no further acute OT services indicated at this time    Follow Up Recommendations  No OT follow up;Supervision - Intermittent    Equipment Recommendations    none   Recommendations for Other Services       Precautions / Restrictions Precautions Precautions: Fall Restrictions Weight Bearing Restrictions: No      Mobility Bed Mobility               General bed mobility comments: pt up in recliner upon arrival  Transfers Overall transfer level: Modified independent Equipment used: Rolling walker (2 wheeled) Transfers: Sit to/from Stand                Balance Overall balance assessment: Needs assistance Sitting-balance support: Feet supported Sitting balance-Leahy Scale: Good     Standing balance support: During functional activity;No upper extremity supported Standing balance-Leahy Scale: Fair                             ADL either performed or assessed with clinical judgement   ADL Overall ADL's : Modified independent                                             Vision Baseline Vision/History: Wears glasses Wears Glasses: Reading only Patient Visual Report: No change from baseline       Perception     Praxis      Pertinent Vitals/Pain Pain Assessment: 0-10 Pain Score: 4  Faces Pain Scale: Hurts little more Pain Location: R groin Pain Descriptors / Indicators: Sore Pain Intervention(s): Limited activity within patient's tolerance;Repositioned     Hand Dominance Right   Extremity/Trunk Assessment Upper Extremity  Assessment Upper Extremity Assessment: Overall WFL for tasks assessed   Lower Extremity Assessment Lower Extremity Assessment: Defer to PT evaluation   Cervical / Trunk Assessment Cervical / Trunk Assessment: Normal   Communication Communication Communication: No difficulties   Cognition Arousal/Alertness: Awake/alert Behavior During Therapy: WFL for tasks assessed/performed Overall Cognitive Status: Within Functional Limits for tasks assessed                                     General Comments       Exercises     Shoulder Instructions      Home Living Family/patient expects to be discharged to:: Private residence Living Arrangements: Spouse/significant other Available Help at Discharge: Family;Friend(s) Type of Home: House Home Access: Stairs to enter;Ramped entrance Entrance Stairs-Number of Steps: 2   Home Layout: One level     Bathroom Shower/Tub: Producer, television/film/video: Standard     Home Equipment: Shower seat          Prior Functioning/Environment Level of Independence: Independent                 OT Problem List: Pain;Impaired balance (  sitting and/or standing)      OT Treatment/Interventions:      OT Goals(Current goals can be found in the care plan section) Acute Rehab OT Goals Patient Stated Goal: return home ASAP OT Goal Formulation: With patient/family Potential to Achieve Goals: Good  OT Frequency:     Barriers to D/C:    no barriers       Co-evaluation              AM-PAC PT "6 Clicks" Daily Activity     Outcome Measure Help from another person eating meals?: None Help from another person taking care of personal grooming?: None Help from another person toileting, which includes using toliet, bedpan, or urinal?: None Help from another person bathing (including washing, rinsing, drying)?: None Help from another person to put on and taking off regular upper body clothing?: None Help from another  person to put on and taking off regular lower body clothing?: None 6 Click Score: 24   End of Session Equipment Utilized During Treatment: Rolling walker  Activity Tolerance: Patient tolerated treatment well Patient left: in chair  OT Visit Diagnosis: Pain;Other abnormalities of gait and mobility (R26.89) Pain - Right/Left: Left Pain - part of body: Leg                Time: 7322-0254 OT Time Calculation (min): 24 min Charges:  OT General Charges $OT Visit: 1 Visit OT Evaluation $OT Eval Moderate Complexity: 1 Mod OT Treatments $Therapeutic Activity: 8-22 mins G-Codes: OT G-codes **NOT FOR INPATIENT CLASS** Functional Assessment Tool Used: AM-PAC 6 Clicks Daily Activity     Galen Manila 03/14/2018, 12:39 PM

## 2018-03-14 NOTE — Progress Notes (Signed)
VASCULAR LAB PRELIMINARY  ARTERIAL  ABI completed:  ABIs indicate mild reduction in arterial flow at rest.  Right great toe pressure is WNL. Left great toe pressure is abnormal.     RIGHT    LEFT    PRESSURE WAVEFORM  PRESSURE WAVEFORM  BRACHIAL 110 T BRACHIAL 105 T  DP 91 M DP 96 M  AT   AT    PT 78 M PT 86 M  PER   PER    GREAT TOE 90 NA GREAT TOE 73 NA    RIGHT LEFT  ABI 0.83 0.87     Casin Federici, RVT 03/14/2018, 9:35 AM

## 2018-03-14 NOTE — Progress Notes (Signed)
    Subjective  - POD #1  Right leg is sore but feels better Has already ambulated and cleared PT Wants to go home tomorrow   Physical Exam:  Incisions clean, minimal drainage from distal incision Palpable right PT pulse       Assessment/Plan:  POD #1  Likely d/c tomorrow Restart Brilinta tomorrow  Durene Cal 03/14/2018 5:33 PM --  Vitals:   03/14/18 1302 03/14/18 1731  BP: 123/75 (!) 151/85  Pulse: 90 97  Resp: 18 20  Temp: 97.7 F (36.5 C) 98.5 F (36.9 C)  SpO2: 95% 96%    Intake/Output Summary (Last 24 hours) at 03/14/2018 1733 Last data filed at 03/14/2018 1603 Gross per 24 hour  Intake 3140 ml  Output 2070 ml  Net 1070 ml     Laboratory CBC    Component Value Date/Time   WBC 14.5 (H) 03/14/2018 0330   HGB 14.9 03/14/2018 0330   HCT 44.4 03/14/2018 0330   PLT 190 03/14/2018 0330    BMET    Component Value Date/Time   NA 137 03/14/2018 0330   K 4.4 03/14/2018 0330   CL 101 03/14/2018 0330   CO2 26 03/14/2018 0330   GLUCOSE 222 (H) 03/14/2018 0330   BUN 15 03/14/2018 0330   CREATININE 1.34 (H) 03/14/2018 0330   CREATININE 0.98 10/11/2016 0941   CALCIUM 8.6 (L) 03/14/2018 0330   GFRNONAA 60 (L) 03/14/2018 0330   GFRAA >60 03/14/2018 0330    COAG Lab Results  Component Value Date   INR 1.01 03/06/2018   INR 0.99 01/21/2018   INR 1.0 10/11/2016   No results found for: PTT  Antibiotics Anti-infectives (From admission, onward)   Start     Dose/Rate Route Frequency Ordered Stop   03/14/18 0200  ceFAZolin (ANCEF) IVPB 2g/100 mL premix     2 g 200 mL/hr over 30 Minutes Intravenous Every 8 hours 03/13/18 2116 03/14/18 1210   03/13/18 0803  ceFAZolin (ANCEF) 2-4 GM/100ML-% IVPB    Note to Pharmacy:  Marrianne Mood   : cabinet override      03/13/18 0803 03/13/18 1400   03/13/18 0759  ceFAZolin (ANCEF) IVPB 2g/100 mL premix     2 g 200 mL/hr over 30 Minutes Intravenous 30 min pre-op 03/13/18 0759 03/13/18 1800       V. Charlena Cross, M.D. [facility] [place] Office: [phone] Pager:  [phone]

## 2018-03-14 NOTE — Anesthesia Postprocedure Evaluation (Signed)
Anesthesia Post Note  Patient: Ryan [name]  Procedure(s) Performed: FEMORAL ENDARTERECTOMY RIGHT (Right Groin) FEMORAL-POPLITEAL ARTERY BYPASS WITH NON-REVERSE VEIN RIGHT (Right Knee) RE-DO COMMON FEMORAL ARTERY EXPOSURE (Right Groin)     Patient location during evaluation: PACU Anesthesia Type: General Level of consciousness: awake and alert Pain management: pain level controlled Vital Signs Assessment: post-procedure vital signs reviewed and stable Respiratory status: spontaneous breathing, nonlabored ventilation, respiratory function stable and patient connected to nasal cannula oxygen Cardiovascular status: blood pressure returned to baseline and stable Postop Assessment: no apparent nausea or vomiting Anesthetic complications: no    Last Vitals:  Vitals:   03/14/18 0005 03/14/18 0434  BP: (!) 123/96 (!) 145/84  Pulse: 88 78  Resp: 18 20  Temp: 37 C 37 C  SpO2: 98% 97%    Last Pain:  Vitals:   03/14/18 0434  TempSrc: Oral  PainSc:                  Makila Colombe COKER

## 2018-03-14 NOTE — Telephone Encounter (Signed)
Spoke to pt for appt on 4/29  Mld lttr

## 2018-03-14 NOTE — Progress Notes (Signed)
Inpatient Diabetes Program Recommendations  AACE/ADA: New Consensus Statement on Inpatient Glycemic Control (2015)  Target Ranges:  Prepandial:   less than 140 mg/dL      Peak postprandial:   less than 180 mg/dL (1-2 hours)      Critically ill patients:  140 - 180 mg/dL   Lab Results  Component Value Date   GLUCAP 172 (H) 03/14/2018   HGBA1C 11.3 (H) 01/21/2018    Review of Glycemic Control  Diabetes history: DM2 Outpatient Diabetes medications: Lantus 75 units qd + Jardiance 25 qd Current orders for Inpatient glycemic control: Novolog moderate correction tid  Inpatient Diabetes Program Recommendations:   Spoke with patient and reviewed home medications. Patient states he recently had his A1c repeated @ his PCP office and had decreased to 8.0. Patient is checking his CBGs 2-3 times daily and taking log of CBGs to PCP office for review. Patient states he is watching his carbohydrates and sugar content of food. Received consult regarding discharged medications, so would discharge on same home medications if eating well. Patient has not required much insulin while in the hospital, so may decrease Lantus by 50% until followup appointment and eating better.  Thank you, Ryan [name]. Benz Vandenberghe, RN, MSN, CDE  Diabetes Coordinator Inpatient Glycemic Control Team Team Pager [phone] (8am-5pm) 03/14/2018 11:50 AM

## 2018-03-14 NOTE — Care Management Note (Signed)
Case Management Note Donn Pierini RN, BSN Unit 4E-Case Manager-- [phone] coverage [phone]  Patient Details  Name: Ryan [name] MRN: [id number] Date of Birth: 08/18/1966  Subjective/Objective:   Pt admitted s/p fem pop bypass                 Action/Plan: PTA pt lived at home- independent- per PT eval no f/u recommendations for [facility] needs- pt may need RW for discharge- notified by Tiffany with [facility] pt has pre-op referral with them- they will f/u with pt post discharge.   Expected Discharge Date:                  Expected Discharge Plan:  Home w Home Health Services  In-House Referral:     Discharge planning Services  CM Consult  Post Acute Care Choice:  Durable Medical Equipment, Home Health Choice offered to:     DME Arranged:    DME Agency:     HH Arranged:    HH Agency:  [facility]  Status of Service:  In process, will continue to follow  If discussed at Long Length of Stay Meetings, dates discussed:    Discharge Disposition: home/ home health   Additional Comments:  Darrold Span, RN 03/14/2018, 11:59 AM

## 2018-03-15 LAB — GLUCOSE, CAPILLARY
GLUCOSE-CAPILLARY: 137 mg/dL — AB (ref 65–99)
Glucose-Capillary: 177 mg/dL — ABNORMAL HIGH (ref 65–99)

## 2018-03-15 MED ORDER — OXYCODONE-ACETAMINOPHEN 5-325 MG PO TABS: 1.0000 | ORAL_TABLET | Freq: Four times a day (QID) | ORAL | 0 refills | Status: DC | PRN

## 2018-03-15 NOTE — Progress Notes (Signed)
    Subjective  - POD #2  Feels well this morning Ambulating without symptoms of claudication   Physical Exam:  Incisions are healing appropriately. I cannot palpate a posterior tibial pulse on the right today however there is a brisk biphasic Doppler signal.       Assessment/Plan:  POD #2  DC home today Restart Brilinta today Discharged with a Praveena wound VAC  Ryan [name] 03/15/2018 9:57 AM --  Vitals:   03/15/18 0459 03/15/18 0733  BP: (!) 113/54 117/78  Pulse: 92 92  Resp: 20 (!) 24  Temp: 98.6 F (37 C) 98.4 F (36.9 C)  SpO2: 92% 97%    Intake/Output Summary (Last 24 hours) at 03/15/2018 0957 Last data filed at 03/15/2018 0500 Gross per 24 hour  Intake 720 ml  Output 1450 ml  Net -730 ml     Laboratory CBC    Component Value Date/Time   WBC 14.5 (H) 03/14/2018 0330   HGB 14.9 03/14/2018 0330   HCT 44.4 03/14/2018 0330   PLT 190 03/14/2018 0330    BMET    Component Value Date/Time   NA 137 03/14/2018 0330   K 4.4 03/14/2018 0330   CL 101 03/14/2018 0330   CO2 26 03/14/2018 0330   GLUCOSE 222 (H) 03/14/2018 0330   BUN 15 03/14/2018 0330   CREATININE 1.34 (H) 03/14/2018 0330   CREATININE 0.98 10/11/2016 0941   CALCIUM 8.6 (L) 03/14/2018 0330   GFRNONAA 60 (L) 03/14/2018 0330   GFRAA >60 03/14/2018 0330    COAG Lab Results  Component Value Date   INR 1.01 03/06/2018   INR 0.99 01/21/2018   INR 1.0 10/11/2016   No results found for: PTT  Antibiotics Anti-infectives (From admission, onward)   Start     Dose/Rate Route Frequency Ordered Stop   03/14/18 0200  ceFAZolin (ANCEF) IVPB 2g/100 mL premix     2 g 200 mL/hr over 30 Minutes Intravenous Every 8 hours 03/13/18 2116 03/14/18 1210   03/13/18 0803  ceFAZolin (ANCEF) 2-4 GM/100ML-% IVPB    Note to Pharmacy:  Marrianne Mood   : cabinet override      03/13/18 0803 03/13/18 1400   03/13/18 0759  ceFAZolin (ANCEF) IVPB 2g/100 mL premix     2 g 200 mL/hr over 30 Minutes  Intravenous 30 min pre-op 03/13/18 0759 03/13/18 1800       V. Charlena Cross, M.D. [facility] [place] Office: [phone] Pager:  [phone]

## 2018-03-18 NOTE — Discharge Summary (Signed)
Physician Discharge Summary   Patient ID: Ryan [name] [id number] 51 y.o. Jun 29, 1966  Admit date: 03/13/2018  Discharge date and time: 03/15/2018  1:12 PM   Admitting Physician: Ryan Libman, MD   Discharge Physician: same  Admission Diagnoses: PERIPHERAL ARTERY DISEASE  Discharge Diagnoses: same  Admission Condition: fair  Discharged Condition: fair  Indication for Admission: right leg claudication  Hospital Course: Mr. Ryan [name] is a 51y.o. Male who came in as an outpatient for R femoral to AK popliteal bypass with saphenous vein and right femoral endarterectomy by Dr. Myra [name] on 03/13/18 due to debilitating claudication.  POD#1 was unremarkable.  Therapy teams were consulted to evaluate the patient, and no continued recommendations were made.  POD#2 patient was ambulating without difficulty and feeling fit for discharge home.  He will follow up in office in about 2 weeks.  He was prescribed 2-3 days of narcotic pain medication for continued post operative pain control.  Discharge instructions were reviewed with the patient and he voiced his understanding.  He was discharged in stable condition.  Consults: None  Treatments: surgery: R femoral to AK popliteal bypass with saphenous vein and right femoral endarterectomy  Discharge Exam: see progress note 03/15/18 Vitals:   03/15/18 0733 03/15/18 1146  BP: 117/78 101/75  Pulse: 92 88  Resp: (!) 24 17  Temp: 98.4 F (36.9 C) 98.3 F (36.8 C)  SpO2: 97% 100%     Disposition: home  - For VQI Registry use ---  Post-op:  Wound infection: No  Graft infection: No  Transfusion: No  If yes,  units given New Arrhythmia: No Ipsilateral amputation: [x ] no, [ ]  Minor, [ ]  BKA, [ ]  AKA Discharge patency: [x ] Primary, [ ]  Primary assisted, [ ]  Secondary, [ ]  Occluded Patency judged by: [ x] Dopper only, [ ]  Palpable graft pulse, [ ]  Palpable distal pulse, [ ]  ABI inc. > 0.15, [ ]  Duplex Discharge ABI: R 0.83, L 0.87 D/C  Ambulatory Status: Ambulatory  Complications: MI: [x ] No, [ ]  Troponin only, [ ]  EKG or Clinical CHF: No Resp failure: [x ] none, [ ]  Pneumonia, [ ]  Ventilator Chg in renal function: [x ] none, [ ]  Inc. Cr > 0.5, [ ]  Temp. Dialysis, [ ]  Permanent dialysis Stroke: [x ] None, [ ]  Minor, [ ]  Major Return to OR: No  Reason for return to OR: [ ]  Bleeding, [ ]  Infection, [ ]  Thrombosis, [ ]  Revision  Discharge medications: Statin use:  Yes ASA use:  Yes Plavix use:  No  for medical reason no indication Beta blocker use: Yes Coumadin use: No  for medical reason no indication    Patient Instructions:  Allergies as of 03/15/2018      Reactions   Hydrocodone Hives, Nausea And Vomiting      Medication List    TAKE these medications   aspirin EC 81 MG tablet Take 1 tablet (81 mg total) by mouth daily.   calcium carbonate 500 MG chewable tablet Commonly known as:  TUMS - dosed in mg elemental calcium Chew 2 tablets by mouth daily as needed for indigestion or heartburn.   carvedilol 6.25 MG tablet Commonly known as:  COREG Take 1 tablet (6.25 mg total) by mouth 2 (two) times daily.   cyclobenzaprine 10 MG tablet Commonly known as:  FLEXERIL Take 10 mg by mouth 2 (two) times daily as needed for muscle spasms.   enalapril 5 MG tablet Commonly known as:  VASOTEC TAKE  ONE TABLET BY MOUTH TWICE DAILY   isosorbide mononitrate 30 MG 24 hr tablet Commonly known as:  IMDUR TAKE 1 TABLET BY MOUTH EVERY DAY   JARDIANCE 25 MG Tabs tablet Generic drug:  empagliflozin Take 25 mg by mouth daily.   LANTUS SOLOSTAR 100 UNIT/ML Solostar Pen Generic drug:  Insulin Glargine Inject 75 Units into the skin at bedtime.   nitroGLYCERIN 0.4 MG SL tablet Commonly known as:  NITROSTAT Place 0.4 mg under the tongue every 5 (five) minutes as needed for chest pain.   oxyCODONE-acetaminophen 5-325 MG tablet Commonly known as:  PERCOCET/ROXICET Take 1-2 tablets by mouth every 6 (six) hours as  needed for moderate pain.   simvastatin 80 MG tablet Commonly known as:  ZOCOR Take 80 mg by mouth at bedtime.   ticagrelor 90 MG Tabs tablet Commonly known as:  BRILINTA Take 90 mg by mouth 2 (two) times daily.   traZODone 100 MG tablet Commonly known as:  DESYREL Take 100 mg by mouth at bedtime as needed for sleep.      Activity: activity as tolerated Diet: regular diet Wound Care: none needed  Follow-up with Dr. Myra [name] in 2 weeks.  SignedEmilie [name] 03/18/2018 10:48 AM

## 2018-04-01 ENCOUNTER — Encounter: Payer: Self-pay | Admitting: *Deleted

## 2018-04-01 ENCOUNTER — Ambulatory Visit (INDEPENDENT_AMBULATORY_CARE_PROVIDER_SITE_OTHER): Payer: Medicaid Other | Admitting: Surgery

## 2018-04-01 ENCOUNTER — Encounter: Payer: Self-pay | Admitting: Surgery

## 2018-04-01 ENCOUNTER — Other Ambulatory Visit: Payer: Self-pay | Admitting: *Deleted

## 2018-04-01 VITALS — BP 98/64 | HR 104 | Temp 98.6°F | Resp 17 | Ht 69.0 in | Wt 207.7 lb

## 2018-04-01 DIAGNOSIS — I70213 Atherosclerosis of native arteries of extremities with intermittent claudication, bilateral legs: Secondary | ICD-10-CM | POA: Diagnosis not present

## 2018-04-01 DIAGNOSIS — I739 Peripheral vascular disease, unspecified: Secondary | ICD-10-CM | POA: Diagnosis not present

## 2018-04-01 DIAGNOSIS — Z720 Tobacco use: Secondary | ICD-10-CM

## 2018-04-01 HISTORY — DX: Atherosclerosis of native arteries of extremities with intermittent claudication, bilateral legs: I70.213

## 2018-04-01 NOTE — H&P (View-Only) (Signed)
Established Intermittent Claudication   History of Present Illness   Ryan [name] is a 52 y.o. (Mar 07, 1966) male who returns to clinic status post right femoral to above-the-knee popliteal artery bypass with ipsilateral non-reversed greater saphenous vein with right femoral endarterectomy via redo right common femoral artery exposure by Dr. Myra Gianotti on 03/13/2018.  Surgical history also significant for aortobifemoral bypass 01/2013 as well as left carotid subclavian bypass 04/2013.  Postoperatively patient states that his claudication symptoms of right lower extremity have resolved.  He also denies any rest pain or active tissue ischemia of right foot.  Patient states his right leg does swell more than usual especially after being on his feet during the day however this has also been improving.  He is taking his Brilinta, aspirin, and statin daily.  Patient states he is smoking 1 to 2 cigarettes a day.  Patient is also here in office today to discuss debilitating claudication symptoms of left lower extremity that have become more apparent especially after the resolution of right lower externally claudication.  He states this is affecting his day-to-day life and would like to address his left leg as soon as possible.  He denies rest pain or tissue loss left lower extremity.    The patient's PMH, PSH, SH, and FamHx were reviewed today are unchanged from prior office visit.  Current Outpatient Medications  Medication Sig Dispense Refill  . aspirin EC 81 MG tablet Take 1 tablet (81 mg total) by mouth daily. 90 tablet 3  . calcium carbonate (TUMS - DOSED IN MG ELEMENTAL CALCIUM) 500 MG chewable tablet Chew 2 tablets by mouth daily as needed for indigestion or heartburn.     . carvedilol (COREG) 6.25 MG tablet Take 1 tablet (6.25 mg total) by mouth 2 (two) times daily. 180 tablet 3  . cyclobenzaprine (FLEXERIL) 10 MG tablet Take 10 mg by mouth 2 (two) times daily as needed for muscle spasms.     .  empagliflozin (JARDIANCE) 25 MG TABS tablet Take 25 mg by mouth daily.    . enalapril (VASOTEC) 5 MG tablet TAKE ONE TABLET BY MOUTH TWICE DAILY 60 tablet 2  . Insulin Glargine (LANTUS SOLOSTAR) 100 UNIT/ML Solostar Pen Inject 75 Units into the skin at bedtime.     . isosorbide mononitrate (IMDUR) 30 MG 24 hr tablet TAKE 1 TABLET BY MOUTH EVERY DAY 90 tablet 2  . nitroGLYCERIN (NITROSTAT) 0.4 MG SL tablet Place 0.4 mg under the tongue every 5 (five) minutes as needed for chest pain.     Marland Kitchen oxyCODONE-acetaminophen (PERCOCET/ROXICET) 5-325 MG tablet Take 1-2 tablets by mouth every 6 (six) hours as needed for moderate pain. 10 tablet 0  . simvastatin (ZOCOR) 80 MG tablet Take 80 mg by mouth at bedtime.    . ticagrelor (BRILINTA) 90 MG TABS tablet Take 90 mg by mouth 2 (two) times daily.     . traZODone (DESYREL) 100 MG tablet Take 100 mg by mouth at bedtime as needed for sleep.     No current facility-administered medications for this visit.     On ROS today: 10 system ROS is negative unless otherwise noted in HPI   Physical Examination   Vitals:   04/01/18 1314  BP: 98/64  Pulse: (!) 104  Resp: 17  Temp: 98.6 F (37 C)  TempSrc: Oral  SpO2: 99%  Weight: 207 lb 11.2 oz (94.2 kg)  Height: 5\' 9"  (1.753 m)   Body mass index is 30.67 kg/m.  General Alert,  O x 3, WD, NAD  Pulmonary Sym exp, good B air movt, CTA B  Cardiac RRR, Nl S1, S2,   Vascular Vessel Right Left  Radial Palpable Palpable  Brachial not examined not examined  Carotid Palpable, No Bruit Palpable, No Bruit  Aorta Not palpable N/A  Femoral Palpable Palpable  Popliteal Not palpable Not palpable  PT Palpable Not palpable  DP Not palpable Not palpable    Gastro- intestinal soft, non-distended, non-tender to palpation,   Musculo- skeletal  moving all extremities well; mild to moderate edema generalized right lower extremity compared to left; vein harvest incision of right medial thigh with dry eschar healing  without sign of infection; right groin incision healed; right popliteal incision healed; no active tissue ischemia left foot  Neurologic Pain and light touch intact in extremities , Motor exam as listed above    Non-Invasive Vascular imaging   ABI (03/14/18)  R:   ABI: 0.83   PT: mono  DP: mono  TBI:  0.82  L:   ABI: 0.87   PT: mono  DP: mono  TBI: 0.66   CTA Ao w BLE runoff IMPRESSION: VASCULAR  Interval aorto bi femoral bypass graft is patent.  New right superficial femoral artery occlusion. The right popliteal artery reconstitutes.  Significant narrowing in the distal left superficial femoral artery this has progressed.   Medical Decision Making   Ryan [name] is a 52 y.o. male who presents s/p R redo R CFA exposure with R CFA endarterectomy and  femoral to AK pop bypass with vein; also would like to discuss surgery for debilitating claudication of LLE   Patent right femoral to above-knee popliteal bypass given palpable right pedal pulses  Claudication symptoms of right lower extremity have resolved postoperatively  Patient now experiencing debilitating claudication of left lower extremity with likely short segment occlusion of left distal SFA based on CTA  Plan will be for possible antegrade approach left lower extremity arteriogram with possible revascularization by Dr. Myra Gianotti on 04/23/2018  Patient is agreeable to this plan and would like to proceed  He was asked to continue his aspirin and Brilinta  Smoking cessation was encouraged   Emilie Rutter, PA-C [facility] [place] Office: [phone]   Patient is having severe claudication symptoms in his left leg.  His right leg now feels good.  I did not evaluate his left leg at his original arteriogram however he did have a CT angiogram which suggested adductor canal stenosis.  He potentially could be a candidate for percutaneous intervention.  Unfortunately I do not  think that left arm access is possible because of his carotid subclavian bypass.  Therefore I will need to proceed with antegrade access through his aortobifemoral graft on the left.  Durene Cal

## 2018-04-01 NOTE — Progress Notes (Addendum)
Established Intermittent Claudication   History of Present Illness   Ryan [name] is a 52 y.o. (Mar 07, 1966) male who returns to clinic status post right femoral to above-the-knee popliteal artery bypass with ipsilateral non-reversed greater saphenous vein with right femoral endarterectomy via redo right common femoral artery exposure by Dr. Myra Gianotti on 03/13/2018.  Surgical history also significant for aortobifemoral bypass 01/2013 as well as left carotid subclavian bypass 04/2013.  Postoperatively patient states that his claudication symptoms of right lower extremity have resolved.  He also denies any rest pain or active tissue ischemia of right foot.  Patient states his right leg does swell more than usual especially after being on his feet during the day however this has also been improving.  He is taking his Brilinta, aspirin, and statin daily.  Patient states he is smoking 1 to 2 cigarettes a day.  Patient is also here in office today to discuss debilitating claudication symptoms of left lower extremity that have become more apparent especially after the resolution of right lower externally claudication.  He states this is affecting his day-to-day life and would like to address his left leg as soon as possible.  He denies rest pain or tissue loss left lower extremity.    The patient's PMH, PSH, SH, and FamHx were reviewed today are unchanged from prior office visit.  Current Outpatient Medications  Medication Sig Dispense Refill  . aspirin EC 81 MG tablet Take 1 tablet (81 mg total) by mouth daily. 90 tablet 3  . calcium carbonate (TUMS - DOSED IN MG ELEMENTAL CALCIUM) 500 MG chewable tablet Chew 2 tablets by mouth daily as needed for indigestion or heartburn.     . carvedilol (COREG) 6.25 MG tablet Take 1 tablet (6.25 mg total) by mouth 2 (two) times daily. 180 tablet 3  . cyclobenzaprine (FLEXERIL) 10 MG tablet Take 10 mg by mouth 2 (two) times daily as needed for muscle spasms.     .  empagliflozin (JARDIANCE) 25 MG TABS tablet Take 25 mg by mouth daily.    . enalapril (VASOTEC) 5 MG tablet TAKE ONE TABLET BY MOUTH TWICE DAILY 60 tablet 2  . Insulin Glargine (LANTUS SOLOSTAR) 100 UNIT/ML Solostar Pen Inject 75 Units into the skin at bedtime.     . isosorbide mononitrate (IMDUR) 30 MG 24 hr tablet TAKE 1 TABLET BY MOUTH EVERY DAY 90 tablet 2  . nitroGLYCERIN (NITROSTAT) 0.4 MG SL tablet Place 0.4 mg under the tongue every 5 (five) minutes as needed for chest pain.     Marland Kitchen oxyCODONE-acetaminophen (PERCOCET/ROXICET) 5-325 MG tablet Take 1-2 tablets by mouth every 6 (six) hours as needed for moderate pain. 10 tablet 0  . simvastatin (ZOCOR) 80 MG tablet Take 80 mg by mouth at bedtime.    . ticagrelor (BRILINTA) 90 MG TABS tablet Take 90 mg by mouth 2 (two) times daily.     . traZODone (DESYREL) 100 MG tablet Take 100 mg by mouth at bedtime as needed for sleep.     No current facility-administered medications for this visit.     On ROS today: 10 system ROS is negative unless otherwise noted in HPI   Physical Examination   Vitals:   04/01/18 1314  BP: 98/64  Pulse: (!) 104  Resp: 17  Temp: 98.6 F (37 C)  TempSrc: Oral  SpO2: 99%  Weight: 207 lb 11.2 oz (94.2 kg)  Height: 5\' 9"  (1.753 m)   Body mass index is 30.67 kg/m.  General Alert,  O x 3, WD, NAD  Pulmonary Sym exp, good B air movt, CTA B  Cardiac RRR, Nl S1, S2,   Vascular Vessel Right Left  Radial Palpable Palpable  Brachial not examined not examined  Carotid Palpable, No Bruit Palpable, No Bruit  Aorta Not palpable N/A  Femoral Palpable Palpable  Popliteal Not palpable Not palpable  PT Palpable Not palpable  DP Not palpable Not palpable    Gastro- intestinal soft, non-distended, non-tender to palpation,   Musculo- skeletal  moving all extremities well; mild to moderate edema generalized right lower extremity compared to left; vein harvest incision of right medial thigh with dry eschar healing  without sign of infection; right groin incision healed; right popliteal incision healed; no active tissue ischemia left foot  Neurologic Pain and light touch intact in extremities , Motor exam as listed above    Non-Invasive Vascular imaging   ABI (03/14/18)  R:   ABI: 0.83   PT: mono  DP: mono  TBI:  0.82  L:   ABI: 0.87   PT: mono  DP: mono  TBI: 0.66   CTA Ao w BLE runoff IMPRESSION: VASCULAR  Interval aorto bi femoral bypass graft is patent.  New right superficial femoral artery occlusion. The right popliteal artery reconstitutes.  Significant narrowing in the distal left superficial femoral artery this has progressed.   Medical Decision Making   Ryan [name] is a 52 y.o. male who presents s/p R redo R CFA exposure with R CFA endarterectomy and  femoral to AK pop bypass with vein; also would like to discuss surgery for debilitating claudication of LLE   Patent right femoral to above-knee popliteal bypass given palpable right pedal pulses  Claudication symptoms of right lower extremity have resolved postoperatively  Patient now experiencing debilitating claudication of left lower extremity with likely short segment occlusion of left distal SFA based on CTA  Plan will be for possible antegrade approach left lower extremity arteriogram with possible revascularization by Dr. Myra Gianotti on 04/23/2018  Patient is agreeable to this plan and would like to proceed  He was asked to continue his aspirin and Brilinta  Smoking cessation was encouraged   Emilie Rutter, PA-C [facility] Office: [phone]   Patient is having severe claudication symptoms in his left leg.  His right leg now feels good.  I did not evaluate his left leg at his original arteriogram however he did have a CT angiogram which suggested adductor canal stenosis.  He potentially could be a candidate for percutaneous intervention.  Unfortunately I do not  think that left arm access is possible because of his carotid subclavian bypass.  Therefore I will need to proceed with antegrade access through his aortobifemoral graft on the left.  Durene Cal

## 2018-04-02 ENCOUNTER — Encounter: Payer: Self-pay | Admitting: *Deleted

## 2018-04-02 NOTE — Progress Notes (Signed)
Per Dr. Myra Gianotti patient instructed to stay on Brillinta. Procedure scheduled for 04/23/2018. Patient verbalized understanding.

## 2018-04-23 ENCOUNTER — Ambulatory Visit (HOSPITAL_COMMUNITY)
Admission: RE | Admit: 2018-04-23 | Discharge: 2018-04-23 | Disposition: A | Discharge status: Home / Self Care | Payer: Medicaid Other | Source: Ambulatory Visit | Attending: Surgery | Admitting: Surgery

## 2018-04-23 ENCOUNTER — Ambulatory Visit (HOSPITAL_COMMUNITY)
Admission: RE | Disposition: A | Discharge status: Home / Self Care | Payer: Self-pay | Source: Ambulatory Visit | Attending: Surgery

## 2018-04-23 DIAGNOSIS — F17210 Nicotine dependence, cigarettes, uncomplicated: Secondary | ICD-10-CM | POA: Diagnosis not present

## 2018-04-23 DIAGNOSIS — Z9889 Other specified postprocedural states: Secondary | ICD-10-CM | POA: Diagnosis not present

## 2018-04-23 DIAGNOSIS — Z7982 Long term (current) use of aspirin: Secondary | ICD-10-CM | POA: Insufficient documentation

## 2018-04-23 DIAGNOSIS — Z79899 Other long term (current) drug therapy: Secondary | ICD-10-CM | POA: Diagnosis not present

## 2018-04-23 DIAGNOSIS — Z794 Long term (current) use of insulin: Secondary | ICD-10-CM | POA: Diagnosis not present

## 2018-04-23 DIAGNOSIS — M7989 Other specified soft tissue disorders: Secondary | ICD-10-CM | POA: Insufficient documentation

## 2018-04-23 DIAGNOSIS — I70212 Atherosclerosis of native arteries of extremities with intermittent claudication, left leg: Secondary | ICD-10-CM | POA: Diagnosis not present

## 2018-04-23 DIAGNOSIS — I70201 Unspecified atherosclerosis of native arteries of extremities, right leg: Secondary | ICD-10-CM | POA: Diagnosis not present

## 2018-04-23 HISTORY — PX: LOWER EXTREMITY ANGIOGRAPHY: CATH118251

## 2018-04-23 HISTORY — PX: PERIPHERAL VASCULAR INTERVENTION: CATH118257

## 2018-04-23 LAB — POCT I-STAT, CHEM 8
BUN: 12 mg/dL (ref 6–20)
Calcium, Ion: 1.13 mmol/L — ABNORMAL LOW (ref 1.15–1.40)
Chloride: 104 mmol/L (ref 101–111)
Creatinine, Ser: 1 mg/dL (ref 0.61–1.24)
Glucose, Bld: 122 mg/dL — ABNORMAL HIGH (ref 65–99)
HCT: 46 % (ref 39.0–52.0)
Hemoglobin: 15.6 g/dL (ref 13.0–17.0)
Potassium: 3.8 mmol/L (ref 3.5–5.1)
Sodium: 139 mmol/L (ref 135–145)
TCO2: 23 mmol/L (ref 22–32)

## 2018-04-23 LAB — GLUCOSE, CAPILLARY
GLUCOSE-CAPILLARY: 148 mg/dL — AB (ref 65–99)
GLUCOSE-CAPILLARY: 145 mg/dL — AB (ref 65–99)

## 2018-04-23 LAB — POCT ACTIVATED CLOTTING TIME
ACTIVATED CLOTTING TIME: 158 s
Activated Clotting Time: 224 seconds

## 2018-04-23 SURGERY — LOWER EXTREMITY ANGIOGRAPHY
Anesthesia: LOCAL

## 2018-04-23 MED ORDER — HYDRALAZINE HCL 20 MG/ML IJ SOLN: 5.0000 mg | INTRAMUSCULAR | Status: DC | PRN

## 2018-04-23 MED ORDER — ACETAMINOPHEN 325 MG PO TABS: 650.0000 mg | ORAL_TABLET | ORAL | Status: DC | PRN

## 2018-04-23 MED ORDER — LIDOCAINE HCL (PF) 1 % IJ SOLN
INTRAMUSCULAR | Status: AC
  Filled 2018-04-23: qty 30

## 2018-04-23 MED ORDER — NITROGLYCERIN 1 MG/10 ML FOR IR/CATH LAB
INTRA_ARTERIAL | Status: AC
  Filled 2018-04-23: qty 10

## 2018-04-23 MED ORDER — LIDOCAINE HCL (PF) 1 % IJ SOLN
INTRAMUSCULAR | Status: DC | PRN
  Administered 2018-04-23: 15 mL

## 2018-04-23 MED ORDER — SODIUM CHLORIDE 0.9 % IV SOLN
INTRAVENOUS | Status: DC
  Administered 2018-04-23: 06:00:00 via INTRAVENOUS

## 2018-04-23 MED ORDER — HEPARIN (PORCINE) IN NACL 2-0.9 UNITS/ML
INTRAMUSCULAR | Status: AC | PRN
  Administered 2018-04-23 (×2): 500 mL via INTRA_ARTERIAL

## 2018-04-23 MED ORDER — MIDAZOLAM HCL 2 MG/2ML IJ SOLN
INTRAMUSCULAR | Status: AC
  Filled 2018-04-23: qty 2

## 2018-04-23 MED ORDER — NITROGLYCERIN 1 MG/10 ML FOR IR/CATH LAB
INTRA_ARTERIAL | Status: DC | PRN
  Administered 2018-04-23: 800 ug via INTRA_ARTERIAL

## 2018-04-23 MED ORDER — HEPARIN SODIUM (PORCINE) 1000 UNIT/ML IJ SOLN
INTRAMUSCULAR | Status: AC
  Filled 2018-04-23: qty 1

## 2018-04-23 MED ORDER — MIDAZOLAM HCL 2 MG/2ML IJ SOLN
INTRAMUSCULAR | Status: DC | PRN
  Administered 2018-04-23 (×2): 1 mg via INTRAVENOUS
  Administered 2018-04-23: 2 mg via INTRAVENOUS

## 2018-04-23 MED ORDER — HEPARIN SODIUM (PORCINE) 1000 UNIT/ML IJ SOLN
INTRAMUSCULAR | Status: DC | PRN
  Administered 2018-04-23: 9000 [IU] via INTRAVENOUS

## 2018-04-23 MED ORDER — HEPARIN (PORCINE) IN NACL 1000-0.9 UT/500ML-% IV SOLN
INTRAVENOUS | Status: AC
  Filled 2018-04-23: qty 1000

## 2018-04-23 MED ORDER — ONDANSETRON HCL 4 MG/2ML IJ SOLN: 4.0000 mg | Freq: Four times a day (QID) | INTRAMUSCULAR | Status: DC | PRN

## 2018-04-23 MED ORDER — SODIUM CHLORIDE 0.9% FLUSH: 3.0000 mL | Freq: Two times a day (BID) | INTRAVENOUS | Status: DC

## 2018-04-23 MED ORDER — LABETALOL HCL 5 MG/ML IV SOLN: 10.0000 mg | INTRAVENOUS | Status: DC | PRN

## 2018-04-23 MED ORDER — SODIUM CHLORIDE 0.9 % WEIGHT BASED INFUSION: 1.0000 mL/kg/h | INTRAVENOUS | Status: DC

## 2018-04-23 MED ORDER — SODIUM CHLORIDE 0.9% FLUSH: 3.0000 mL | INTRAVENOUS | Status: DC | PRN

## 2018-04-23 MED ORDER — SODIUM CHLORIDE 0.9 % IV SOLN: 250.0000 mL | INTRAVENOUS | Status: DC | PRN

## 2018-04-23 MED ORDER — IODIXANOL 320 MG/ML IV SOLN
INTRAVENOUS | Status: DC | PRN
  Administered 2018-04-23: 175 mL via INTRA_ARTERIAL

## 2018-04-23 MED ORDER — FENTANYL CITRATE (PF) 100 MCG/2ML IJ SOLN
INTRAMUSCULAR | Status: DC | PRN
  Administered 2018-04-23: 25 ug via INTRAVENOUS
  Administered 2018-04-23: 50 ug via INTRAVENOUS
  Administered 2018-04-23: 25 ug via INTRAVENOUS

## 2018-04-23 MED ORDER — HYDROMORPHONE HCL 1 MG/ML IJ SOLN: 0.5000 mg | INTRAMUSCULAR | Status: DC | PRN

## 2018-04-23 MED ORDER — FENTANYL CITRATE (PF) 100 MCG/2ML IJ SOLN
INTRAMUSCULAR | Status: AC
  Filled 2018-04-23: qty 2

## 2018-04-23 SURGICAL SUPPLY — 25 items
BAG SNAP BAND KOVER 36X36 (MISCELLANEOUS) ×3 IMPLANT
BALLN MUSTANG 6X100X135 (BALLOONS) ×3
BALLOON MUSTANG 6X100X135 (BALLOONS) ×2 IMPLANT
CATH ANGIO 5F BER2 65CM (CATHETERS) ×3 IMPLANT
COVER DOME SNAP 22 D (MISCELLANEOUS) ×3 IMPLANT
COVER PRB 48X5XTLSCP FOLD TPE (BAG) ×4 IMPLANT
COVER PROBE 5X48 (BAG) ×2
DEVICE CONTINUOUS FLUSH (MISCELLANEOUS) ×3 IMPLANT
DEVICE TORQUE H2O (MISCELLANEOUS) ×3 IMPLANT
DRAPE ZERO GRAVITY STERILE (DRAPES) ×3 IMPLANT
GUIDEWIRE ANGLED .035X150CM (WIRE) ×3 IMPLANT
KIT MICROPUNCTURE NIT STIFF (SHEATH) ×3 IMPLANT
KIT PV (KITS) ×3 IMPLANT
SHEATH PINNACLE 5F 10CM (SHEATH) ×3 IMPLANT
SHEATH PINNACLE 6F 10CM (SHEATH) ×3 IMPLANT
SHIELD RADPAD SCOOP 12X17 (MISCELLANEOUS) ×3 IMPLANT
STENT ELUVIA 7X100X130 (Permanent Stent) ×3 IMPLANT
STENT ELUVIA 7X120X130 (Permanent Stent) ×3 IMPLANT
SYR MEDRAD MARK V 150ML (SYRINGE) ×3 IMPLANT
TAPE VIPERTRACK RADIOPAQ (MISCELLANEOUS) ×2 IMPLANT
TAPE VIPERTRACK RADIOPAQUE (MISCELLANEOUS) ×1
TRANSDUCER W/STOPCOCK (MISCELLANEOUS) ×3 IMPLANT
TRAY PV CATH (CUSTOM PROCEDURE TRAY) ×3 IMPLANT
WIRE BENTSON .035X145CM (WIRE) ×3 IMPLANT
WIRE ROSEN-J .035X260CM (WIRE) ×3 IMPLANT

## 2018-04-23 NOTE — Discharge Instructions (Signed)
**Note -identified via Obfuscation** Femoral Site Care °Refer to this sheet in the next few weeks. These instructions provide you with information about caring for yourself after your procedure. Your health care provider may also give you more specific instructions. Your treatment has been planned according to current medical practices, but problems sometimes occur. Call your health care provider if you have any problems or questions after your procedure. °What can I expect after the procedure? °After your procedure, it is typical to have the following: °· Bruising at the site that usually fades within 1-2 weeks. °· Blood collecting in the tissue (hematoma) that may be painful to the touch. It should usually decrease in size and tenderness within 1-2 weeks. ° °Follow these instructions at home: °· Take medicines only as directed by your health care provider. °· You may shower 24-48 hours after the procedure or as directed by your health care provider. Remove the bandage (dressing) and gently wash the site with plain soap and water. Pat the area dry with a clean towel. Do not rub the site, because this may cause bleeding. °· Do not take baths, swim, or use a hot tub until your health care provider approves. °· Check your insertion site every day for redness, swelling, or drainage. °· Do not apply powder or lotion to the site. °· Limit use of stairs to twice a day for the first 2-3 days or as directed by your health care provider. °· Do not squat for the first 2-3 days or as directed by your health care provider. °· Do not lift over 10 lb (4.5 kg) for 5 days after your procedure or as directed by your health care provider. °· Ask your health care provider when it is okay to: °? Return to work or school. °? Resume usual physical activities or sports. °? Resume sexual activity. °· Do not drive home if you are discharged the same day as the procedure. Have someone else drive you. °· You may drive 24 hours after the procedure unless otherwise instructed by  your health care provider. °· Do not operate machinery or power tools for 24 hours after the procedure or as directed by your health care provider. °· If your procedure was done as an outpatient procedure, which means that you went home the same day as your procedure, a responsible adult should be with you for the first 24 hours after you arrive home. °· Keep all follow-up visits as directed by your health care provider. This is important. °Contact a health care provider if: °· You have a fever. °· You have chills. °· You have increased bleeding from the site. Hold pressure on the site. °Get help right away if: °· You have unusual pain at the site. °· You have redness, warmth, or swelling at the site. °· You have drainage (other than a small amount of blood on the dressing) from the site. °· The site is bleeding, and the bleeding does not stop after 30 minutes of holding steady pressure on the site. °· Your leg or foot becomes pale, cool, tingly, or numb. °This information is not intended to replace advice given to you by your health care provider. Make sure you discuss any questions you have with your health care provider. °Document Released: 07/24/2014 Document Revised: 04/27/2016 Document Reviewed: 06/09/2014 °Elsevier Interactive Patient Education © 2018 [employer] ° °

## 2018-04-23 NOTE — Progress Notes (Signed)
Patient with left femoral sheath in place. Patient rolled onto his right side. I had him move back to his back. Offered comfort measures, patient angry , wants to go home. Stated he will " Pull the tube out and just leave". I explainded the nature of his bedrest and bedrest post sheath removal. He is very angry about not being able to just go home immediately. Groin site level zero despite him moving around.   Dr. Myra Gianotti notified.

## 2018-04-23 NOTE — Progress Notes (Signed)
Site area: Left groin a 6 french arterial sheath was removed  Site Prior to Removal:  Level 0  Pressure Applied For 20 MINUTES    Minutes Beginning at 1100am  Manual:   Yes.    Patient Status During Pull:  Stable Post Pull Groin Site:  Level 0  Post Pull Instructions Given:  Yes.    Post Pull Pulses Present:  Yes.    Dressing Applied:  Yes.    Comments:  VS remain stable

## 2018-04-23 NOTE — Interval H&P Note (Signed)
History and Physical Interval Note:  04/23/2018 10:40 PM  Ryan [name]  has presented today for surgery, with the diagnosis of pvd  The various methods of treatment have been discussed with the patient and family. After consideration of risks, benefits and other options for treatment, the patient has consented to  Procedure(s) with comments: LOWER EXTREMITY ANGIOGRAPHY (N/A) PERIPHERAL VASCULAR INTERVENTION (Left) - superficial femoral as a surgical intervention .  The patient's history has been reviewed, patient examined, no change in status, stable for surgery.  I have reviewed the patient's chart and labs.  Questions were answered to the patient's satisfaction.     Durene Cal

## 2018-04-23 NOTE — Op Note (Signed)
    Patient name: Ryan [name] MRN: [id number] DOB: Dec 10, 1965 Sex: male  04/23/2018 Pre-operative Diagnosis: Left leg claudication Post-operative diagnosis:  Same Surgeon:  Durene Cal Procedure Performed:  1.  Ultrasound-guided access, left femoral artery (antegrade)  2.  Left lower extremity runoff  3.  Stent, left superficial femoral artery  4.  Conscious sedation (75 minutes)  5.  Intra-arterial administration of IV nitroglycerin.    Indications: Patient has a history of an aortobifemoral bypass graft as well as a right femoral-popliteal bypass graft and a carotid subclavian bypass.  He is having worsening symptoms in his left leg.  He comes in today for further evaluation.  Antegrade access was required given his prior operations.  Procedure:  The patient was identified in the holding area and taken to room 8.  The patient was then placed supine on the table and prepped and draped in the usual sterile fashion.  A time out was called.  Conscious sedation was administered with use of IV fentanyl and Versed under continuous physician and nurse monitoring.  Heart rate, blood pressure, and oxygen saturations were continuously monitored.  Ultrasound was used to evaluate the left common femoral artery.  It was patent .  A digital ultrasound image was acquired.  A micropuncture needle was used to access the left common femoral artery under ultrasound guidance.  An 018 wire was advanced without resistance and a micropuncture sheath was placed.  Contrast injections were performed through the micropuncture sheath which confirmed that the wire was in the profundofemoral artery.  Over a Bentson wire, a 5 French sheath was placed.  I then inserted a Berenstein 2 catheter over the Bentson wire and pulled back the sheath and catheter until I could navigate a Glidewire into the superficial femoral artery.  The 5 French sheath was then advanced into the superficial femoral artery.  Left lower extremity runoff  was performed at this time Findings:   Aortogram: Not evaluated  Right Lower Extremity: Not evaluated  Left Lower Extremity: The profundofemoral and visualized portions of the common femoral artery are widely patent.  The superficial femoral artery is patent however there are tandem, greater than 80% lesions in the midportion.  Popliteal artery is patent throughout its course.  The dominant runoff is the posterior tibial artery.  Intervention: After the above images were acquired the decision was made to proceed with intervention.  The patient was fully heparinized.  Using a Berenstein 2 catheter and a Glidewire, and the lesions within the superficial femoral artery were successfully crossed.  A contrast injection was performed with the catheter tip in the popliteal artery, confirming successful crossing of the lesion.  A Rosen wire was in place.  I elected to primarily stent the lesion.  A drug-eluting Elluvia 7x120 and 7x100 stents were placed in overlapping fashion and molded using a 6 mm balloon.  Completion imaging revealed resolution of the stenosis within the superficial femoral artery.  I ended up giving intra-arterial nitroglycerin which identified runoff that was improved from the preoperative imaging with the anterior tibial artery now being patent across the ankle.  Impression:  #1 tandem 80% left superficial femoral artery stenosis successfully treated with overlapping 7 x 120 and 7 x 100 drug-eluting Elluvia stents.   Juleen China, M.D. [facility] Office: [phone] Pager:  [phone]

## 2018-04-24 ENCOUNTER — Encounter (HOSPITAL_COMMUNITY): Payer: Self-pay | Admitting: Surgery

## 2018-04-24 ENCOUNTER — Telehealth: Payer: Self-pay | Admitting: *Deleted

## 2018-04-24 ENCOUNTER — Telehealth: Payer: Self-pay | Admitting: Surgery

## 2018-04-24 MED FILL — Heparin Sod (Porcine)-NaCl IV Soln 1000 Unit/500ML-0.9%: INTRAVENOUS | Qty: 1000 | Status: AC

## 2018-04-24 NOTE — Telephone Encounter (Signed)
Patient called c/o left leg pain. 8/10 pain scale that will decrease to 4/10 after medication. Patient states that extremity is warm, pink and good sensation. Instructed patient to report to [facility] for evaluation of his leg. Patient stated he wanted to give it a day or so and will report to the ER if no improvement.

## 2018-04-24 NOTE — Telephone Encounter (Signed)
sch appt 8am ABI 9am LE Art 915 F/u NP s/p stent

## 2018-04-24 NOTE — Telephone Encounter (Signed)
-----   Message from Westley Hummer, RN sent at 04/23/2018  9:36 AM EDT ----- Regarding: Appointment   ----- Message ----- From: Nada Libman, MD Sent: 04/23/2018   9:12 AM To: [facility] Charge Pool  04/23/2018:  Surgeon:  Durene Cal Procedure Performed:  1.  Ultrasound-guided access, left femoral artery (antegrade)  2.  Left lower extremity runoff  3.  Stent, left superficial femoral artery  4.  Conscious sedation (75 minutes)  5.  Intra-arterial administration of IV nitroglycerin.   Follow-up 1 month with ABIs and duplex to see Rosalita Chessman

## 2018-05-01 ENCOUNTER — Other Ambulatory Visit: Payer: Self-pay

## 2018-05-01 DIAGNOSIS — Z48812 Encounter for surgical aftercare following surgery on the circulatory system: Secondary | ICD-10-CM

## 2018-05-01 DIAGNOSIS — I70213 Atherosclerosis of native arteries of extremities with intermittent claudication, bilateral legs: Secondary | ICD-10-CM

## 2018-05-01 DIAGNOSIS — I739 Peripheral vascular disease, unspecified: Secondary | ICD-10-CM

## 2018-05-24 ENCOUNTER — Encounter (HOSPITAL_COMMUNITY): Payer: Medicaid Other

## 2018-05-24 ENCOUNTER — Ambulatory Visit: Payer: Medicaid Other | Admitting: Family

## 2018-05-28 ENCOUNTER — Ambulatory Visit: Payer: Medicaid Other | Admitting: Physician Assistant

## 2018-05-29 ENCOUNTER — Encounter: Payer: Self-pay | Admitting: Physician Assistant

## 2018-06-27 ENCOUNTER — Other Ambulatory Visit: Payer: Self-pay | Admitting: Cardiovascular Disease

## 2018-07-01 ENCOUNTER — Other Ambulatory Visit: Payer: Self-pay

## 2018-07-01 DIAGNOSIS — I739 Peripheral vascular disease, unspecified: Secondary | ICD-10-CM

## 2018-07-01 DIAGNOSIS — I70209 Unspecified atherosclerosis of native arteries of extremities, unspecified extremity: Secondary | ICD-10-CM

## 2018-07-01 DIAGNOSIS — I70213 Atherosclerosis of native arteries of extremities with intermittent claudication, bilateral legs: Secondary | ICD-10-CM

## 2018-07-01 DIAGNOSIS — I7409 Other arterial embolism and thrombosis of abdominal aorta: Secondary | ICD-10-CM

## 2018-07-01 DIAGNOSIS — Z48812 Encounter for surgical aftercare following surgery on the circulatory system: Secondary | ICD-10-CM

## 2018-07-10 ENCOUNTER — Encounter: Payer: Self-pay | Admitting: Physician Assistant

## 2018-07-17 ENCOUNTER — Ambulatory Visit (HOSPITAL_COMMUNITY)
Admission: RE | Admit: 2018-07-17 | Discharge: 2018-07-17 | Disposition: A | Discharge status: Home / Self Care | Payer: Medicaid Other | Source: Ambulatory Visit | Attending: Surgery | Admitting: Surgery

## 2018-07-17 ENCOUNTER — Ambulatory Visit (INDEPENDENT_AMBULATORY_CARE_PROVIDER_SITE_OTHER)
Admission: RE | Admit: 2018-07-17 | Discharge: 2018-07-17 | Disposition: A | Discharge status: Home / Self Care | Payer: Medicaid Other | Source: Ambulatory Visit | Attending: Surgery | Admitting: Surgery

## 2018-07-17 DIAGNOSIS — I70213 Atherosclerosis of native arteries of extremities with intermittent claudication, bilateral legs: Secondary | ICD-10-CM | POA: Insufficient documentation

## 2018-07-17 DIAGNOSIS — Z95828 Presence of other vascular implants and grafts: Secondary | ICD-10-CM | POA: Insufficient documentation

## 2018-07-17 DIAGNOSIS — I70209 Unspecified atherosclerosis of native arteries of extremities, unspecified extremity: Secondary | ICD-10-CM | POA: Diagnosis not present

## 2018-07-17 DIAGNOSIS — I7409 Other arterial embolism and thrombosis of abdominal aorta: Secondary | ICD-10-CM

## 2018-07-17 DIAGNOSIS — I252 Old myocardial infarction: Secondary | ICD-10-CM | POA: Diagnosis not present

## 2018-07-17 DIAGNOSIS — I739 Peripheral vascular disease, unspecified: Secondary | ICD-10-CM | POA: Diagnosis present

## 2018-07-17 DIAGNOSIS — I10 Essential (primary) hypertension: Secondary | ICD-10-CM | POA: Insufficient documentation

## 2018-07-17 DIAGNOSIS — I2510 Atherosclerotic heart disease of native coronary artery without angina pectoris: Secondary | ICD-10-CM | POA: Insufficient documentation

## 2018-07-17 DIAGNOSIS — E785 Hyperlipidemia, unspecified: Secondary | ICD-10-CM | POA: Insufficient documentation

## 2018-07-17 DIAGNOSIS — F17200 Nicotine dependence, unspecified, uncomplicated: Secondary | ICD-10-CM | POA: Insufficient documentation

## 2018-07-18 ENCOUNTER — Other Ambulatory Visit: Payer: Self-pay

## 2018-07-18 ENCOUNTER — Encounter: Payer: Self-pay | Admitting: Family

## 2018-07-18 ENCOUNTER — Ambulatory Visit (INDEPENDENT_AMBULATORY_CARE_PROVIDER_SITE_OTHER): Payer: Medicaid Other | Admitting: Family

## 2018-07-18 VITALS — BP 111/74 | HR 92 | Temp 98.8°F | Resp 20 | Ht 69.0 in | Wt 208.0 lb

## 2018-07-18 DIAGNOSIS — I739 Peripheral vascular disease, unspecified: Secondary | ICD-10-CM

## 2018-07-18 DIAGNOSIS — I7409 Other arterial embolism and thrombosis of abdominal aorta: Secondary | ICD-10-CM

## 2018-07-18 DIAGNOSIS — Z95820 Peripheral vascular angioplasty status with implants and grafts: Secondary | ICD-10-CM | POA: Diagnosis not present

## 2018-07-18 DIAGNOSIS — F17200 Nicotine dependence, unspecified, uncomplicated: Secondary | ICD-10-CM

## 2018-07-18 NOTE — Progress Notes (Signed)
Postoperative Visit   History of Present Illness  Ryan [name] is a 52 y.o. male who is s/p stenting of left SFA on 04-23-18 by Dr. Myra Gianotti for left leg claudication.  Patient has a history of an aortobifemoral bypass graft as well as a right femoral-popliteal bypass graft and a carotid subclavian bypass.  He was having worsening symptoms in his left leg.  Antegrade access was required given his prior operations.  He no longer has claudication symptoms with walking as far as he wants.  He has no ulcers or wounds.   He suffered a MI in January 2013, and at that time he had a drug-eluting stent placed to his LAD. During this process, an abdominal aortogram revealed aortic occlusion at the level of the renal arteries. He also has a left subclavian artery occlusion. Because of his drug-eluting stent, Dr. Myra Gianotti could not proceed with surgical revascularization. He underwent aortobifemoral bypass grafting on 01/16/2013. He recovered nicely from this and on 04/03/2013 he underwent left carotid subclavian bypass graft. He had to go back to the operating room on 04/18/2013 for drainage of a lymphocele. He is now recovered from his operations.  He had a coronary angioplasty in November, 2017, found on routine exam by his cardiologist, sx was dyspnea, no chest pain.   Diabetic: Yes, last A1C result on file was 11.3 on 01-21-18, severely uncontrolled DM, however, pt states his last A1C was about 8, vastly improved, he is working closely with his PCP to get his DM under as good control as possible.  Tobacco use smoker (1/2ppd, started at age 41)  Pt meds include: Statin :Yes Betablocker: Yes ASA: Yes Other anticoagulants/antiplatelets: Plavix     For VQI Use Only  PRE-ADM LIVING: Home  AMB STATUS: Ambulatory    Past Medical History:  Diagnosis Date  . Acute myocardial infarction of other anterior wall, subsequent episode of care 12/12/2011  . Anal fissure   . Atherosclerosis of  native artery of both lower extremities with intermittent claudication (HCC) 04/01/2018  . Bruises easily    d/t being on Effient  . CAD (coronary artery disease)    A. Acute Ant STEMI 12/12/2011   . Cardiomyopathy secondary 12/26/2011  . Chronic total occlusion of artery of the extremities (HCC) 02/26/2012  . Claudication (HCC) 12/26/2011  . Coronary atherosclerosis of native coronary artery    a. ant STEMI with cardiac arrest 2013 s/p DES to mLAD. b. [place] 10/2016 s/p PTCA to mLAD.  . Diabetes mellitus without complication (HCC)   . Essential hypertension 10/23/2016  . GERD (gastroesophageal reflux disease)    "takes tums"  . History of blood transfusion    no abnormal reaction noted  . History of kidney stones   . Hypertension   . Ischemic cardiomyopathy    a. EF 35% in 02/2012 at time of acute MI, improved to normal on subsequent imaging.  . MI (myocardial infarction) (HCC)    AMI 1/13 - complicated by VT/Tosades  . Mixed hyperlipidemia   . Numbness and tingling of right arm 03/31/2013  . Occlusion and stenosis of carotid artery without mention of cerebral infarction 08/26/2012  . PAD (peripheral artery disease) (HCC)    followed by Vascular - aortobifem bypass 01/2013 & left carotid-subclavian artery bypass 04/2013  . Peripheral vascular disease, unspecified (HCC) 12/16/2012  . PVD (peripheral vascular disease) (HCC) 03/31/2013  . Rectal polyp   . Subclavian steal syndrome 08/11/2013  . Uncontrolled diabetes mellitus (HCC) 10/23/2016  Past Surgical History:  Procedure Laterality Date  . ABDOMINAL AORTOGRAM W/LOWER EXTREMITY N/A 06/26/2017   Procedure: Abdominal Aortogram w/Lower Extremity;  Surgeon: Nada Libman, MD;  Location: [facility]IVE [facility];  Service: Cardiovascular;  Laterality: N/A;  . ANAL FISSURECTOMY    . AORTA - BILATERAL FEMORAL ARTERY BYPASS GRAFT N/A 01/16/2013   Procedure: AORTA BIFEMORAL BYPASS GRAFT;  Surgeon: Nada Libman, MD;  Location: [facility] OR;  Service:  Vascular;  Laterality: N/A;  . APPENDECTOMY    . CARDIAC CATHETERIZATION N/A 10/23/2016   Procedure: Left Heart Cath and Coronary Angiography;  Surgeon: Kathleene Hazel, MD;  Location: [facility] INVASIVE CV LAB;  Service: Cardiovascular;  Laterality: N/A;  . CARDIAC CATHETERIZATION N/A 10/23/2016   Procedure: Coronary Balloon Angioplasty;  Surgeon: Kathleene Hazel, MD;  Location: [facility] IN[facility];  Service: Cardiovascular;  Laterality: N/A;  . CAROTID-SUBCLAVIAN BYPASS GRAFT Left 04/03/2013   Procedure: BYPASS GRAFT CAROTID-SUBCLAVIAN;  Surgeon: Nada Libman, MD;  Location: [facility] OR;  Service: Vascular;  Laterality: Left;  . CIRCUMCISION N/A 12/15/2015   Procedure: CIRCUMCISION ADULT;  Surgeon: Malen Gauze, MD;  Location: [facility]S;  Service: Urology;  Laterality: N/A;  . COLONOSCOPY    . CORONARY ANGIOPLASTY     stent placed Dec 12, 2011 and 2018  . DIAGNOSTIC LAPAROSCOPY    . ENDARTERECTOMY FEMORAL Right 03/13/2018   Procedure: FEMORAL ENDARTERECTOMY RIGHT;  Surgeon: Nada Libman, MD;  Location: [facility] OR;  Service: Vascular;  Laterality: Right;  . FEMORAL-POPLITEAL BYPASS GRAFT Right 03/13/2018   Procedure: FEMORAL-POPLITEAL ARTERY BYPASS WITH NON-REVERSE VEIN RIGHT;  Surgeon: Nada Libman, MD;  Location: [facility] OR;  Service: Vascular;  Laterality: Right;  . GROIN DISSECTION Right 03/13/2018   Procedure: RE-DO COMMON FEMORAL ARTERY EXPOSURE;  Surgeon: Nada Libman, MD;  Location: [facility] OR;  Service: Vascular;  Laterality: Right;  . I&D EXTREMITY Left 04/18/2013   Procedure: debridement of left neck lymphocele;  Surgeon: Nada Libman, MD;  Location: [facility] OR;  Service: Vascular;  Laterality: Left;  I and D of left neck  . I&D EXTREMITY Right 11/21/2014   Procedure: IRRIGATION AND DEBRIDEMENT RIGHT HAND;  Surgeon: Dominica Severin, MD;  Location: [facility] OR;  Service: Orthopedics;  Laterality: Right;  . LEFT HEART CATHETERIZATION WITH CORONARY ANGIOGRAM N/A 12/12/2011   Procedure: LEFT HEART  CATHETERIZATION WITH CORONARY ANGIOGRAM;  Surgeon: Kathleene Hazel, MD;  Location: [facility] CATH LAB;  Service: Cardiovascular;  Laterality: N/A;  . LOWER EXTREMITY ANGIOGRAM N/A 01/31/2012   Procedure: LOWER EXTREMITY ANGIOGRAM;  Surgeon: Kathleene Hazel, MD;  Location: [facility] CATH LAB;  Service: Cardiovascular;  Laterality: N/A;  . LOWER EXTREMITY ANGIOGRAPHY N/A 04/23/2018   Procedure: LOWER EXTREMITY ANGIOGRAPHY;  Surgeon: Nada Libman, MD;  Location: [facility];  Service: Cardiovascular;  Laterality: N/A;  . PERCUTANEOUS CORONARY STENT INTERVENTION (PCI-S) Right 12/12/2011   Procedure: PERCUTANEOUS CORONARY STENT INTERVENTION (PCI-S);  Surgeon: Kathleene Hazel, MD;  Location: [facility] CATH LAB;  Service: Cardiovascular;  Laterality: Right;  . PERIPHERAL VASCULAR INTERVENTION Left 04/23/2018   Procedure: PERIPHERAL VASCULAR INTERVENTION;  Surgeon: Nada Libman, MD;  Location: [facility];  Service: Cardiovascular;  Laterality: Left;  superficial femoral  . REPAIR EXTENSOR TENDON Right 11/21/2014   Procedure: WITH REPAIR/RECONSTRUCTION OF EXTENSOR TENDONS AS NEEDED;  Surgeon: Dominica Severin, MD;  Location: [facility] OR;  Service: Orthopedics;  Laterality: Right;    Family History  Problem Relation Age of Onset  . Cancer Father  Lung  . Diabetes Mother   . COPD Mother   . Cancer Maternal Uncle        Colon    Social History   Socioeconomic History  . Marital status: Divorced    Spouse name: Not on file  . Number of children: Not on file  . Years of education: Not on file  . Highest education level: Not on file  Occupational History    Employer: OTHER  Social Needs  . Financial resource strain: Not on file  . Food insecurity:    Worry: Not on file    Inability: Not on file  . Transportation needs:    Medical: Not on file    Non-medical: Not on file  Tobacco Use  . Smoking status: Current Every Day Smoker    Packs/day: 0.25    Years: 25.00    Pack  years: 6.25    Types: Cigarettes  . Smokeless tobacco: Never Used  . Tobacco comment: 1-2 cigs a day  Substance and Sexual Activity  . Alcohol use: No  . Drug use: No  . Sexual activity: Yes  Lifestyle  . Physical activity:    Days per week: Not on file    Minutes per session: Not on file  . Stress: Not on file  Relationships  . Social connections:    Talks on phone: Not on file    Gets together: Not on file    Attends religious service: Not on file    Active member of club or organization: Not on file    Attends meetings of clubs or organizations: Not on file    Relationship status: Not on file  . Intimate partner violence:    Fear of current or ex partner: Not on file    Emotionally abused: Not on file    Physically abused: Not on file    Forced sexual activity: Not on file  Other Topics Concern  . Not on file  Social History Narrative   Disabled - no longer works    Allergies  Allergen Reactions  . Hydrocodone Hives and Nausea And Vomiting    Current Outpatient Medications on File Prior to Visit  Medication Sig Dispense Refill  . aspirin EC 81 MG tablet Take 1 tablet (81 mg total) by mouth daily. 90 tablet 3  . calcium carbonate (TUMS - DOSED IN MG ELEMENTAL CALCIUM) 500 MG chewable tablet Chew 2 tablets by mouth daily as needed for indigestion or heartburn.     . carvedilol (COREG) 6.25 MG tablet Take 1 tablet (6.25 mg total) by mouth 2 (two) times daily. 180 tablet 3  . cyclobenzaprine (FLEXERIL) 10 MG tablet Take 10 mg by mouth 2 (two) times daily as needed for muscle spasms.     . empagliflozin (JARDIANCE) 25 MG TABS tablet Take 25 mg by mouth daily.    . enalapril (VASOTEC) 5 MG tablet TAKE ONE TABLET BY MOUTH TWICE DAILY 60 tablet 2  . Insulin Glargine (LANTUS SOLOSTAR) 100 UNIT/ML Solostar Pen Inject 75 Units into the skin at bedtime.     . isosorbide mononitrate (IMDUR) 30 MG 24 hr tablet Take 1 tablet (30 mg total) by mouth daily. Please keep upcoming appt in  August for future refills. Thank you 90 tablet 1  . nitroGLYCERIN (NITROSTAT) 0.4 MG SL tablet Place 0.4 mg under the tongue every 5 (five) minutes as needed for chest pain.     Marland Kitchen oxyCODONE-acetaminophen (PERCOCET) 10-325 MG tablet Take 1 tablet by mouth  every 4 (four) hours as needed for pain.    . simvastatin (ZOCOR) 80 MG tablet Take 80 mg by mouth at bedtime.    . ticagrelor (BRILINTA) 90 MG TABS tablet Take 90 mg by mouth 2 (two) times daily.     . traZODone (DESYREL) 100 MG tablet Take 100 mg by mouth at bedtime as needed for sleep.     No current facility-administered medications on file prior to visit.       Physical Examination  Vitals:   07/18/18 1111 07/18/18 1113  BP: 113/71 111/74  Pulse: 92   Resp: 20   Temp: 98.8 F (37.1 C)   TempSrc: Oral   SpO2: 97%   Weight: 208 lb (94.3 kg)   Height: 5\' 9"  (1.753 m)    Body mass index is 30.72 kg/m.  PHYSICAL EXAMINATION: General: The patient appears his stated age.   HEENT:  No gross abnormalities Pulmonary: Respirations are non-labored, CTAB with good air movement in all fields.  Abdomen: Soft and non-tender with normal bowel sounds Musculoskeletal: There are no major deformities.   Neurologic: No focal weakness or paresthesias are detected, Skin: There are no ulcer or rashes noted. Psychiatric: The patient has normal affect. Cardiovascular: There is a regular rate and rhythm without significant murmur appreciated.   Vascular: Vessel Right Left  Radial 2+Palpable 2+Palpable  Brachial Palpable Palpable  Carotid Palpable, without bruit Palpable, without bruit  Aorta Not palpable N/A  Femoral 3+Palpable 2+Palpable  Popliteal 1+ palpable Not palpable  PT 2+ Palpable not Palpable  DP faintly Palpable faintly Palpable     DATA  Right EIA and LE Arterial Duplex (07-17-18): Right distal EIA: 88 cm/s PSV, triphasic waveforms Right LE highest velocity is 181 cm/s, biphasic waveforms at the inflow and proximal  anastomosis, triphasic distal to this. Left LE highest velocity is 237 cm/s at the mid CFA, mostly triphasic, some biphasic waveforms.  No stenosis in the left SFA stent.   ABI (Date: 07-17-18:  R:   ABI: 1.15 (was 0.83 on 03-14-18),   PT: tri  DP: tri  TBI:  0.97 (was 0.82)  L:   ABI: 1.19 (was 0.87),   PT: tri  DP: tri  TBI: 0.92 (was 0.66)  Bilateral ABI and TBI improved to normal with all triphasic waveforms.     Medical Decision Making  Dmario Russom is a 52 y.o. male who is s/p stenting of left SFA on 04-23-18 by Dr. Myra Gianotti for left leg claudication.  Patient has a history of an aortobifemoral bypass graft as well as a right femoral-popliteal bypass graft and a carotid subclavian bypass.  He was having worsening symptoms in his left leg.  Antegrade access was required given his prior operations.  He no longer has claudication symptoms with walking as far as he wants.  He has no ulcers or wounds.   Based on yesterday's non invasive vascular exams, this patient needs: right EIA and right LE arterial duplex and bilateral ABI in 3 months, see me afterward. Will check carotid duplex in 6 months.  Over 3 minutes was spent counseling patient re smoking cessation, and patient was given several free resources re smoking cessation. Continue working closely with his PCP for continued improvement in glycemic control.   I discussed in depth with the patient the nature of atherosclerosis, and emphasized the importance of maximal medical management including strict control of blood pressure, blood glucose, and lipid levels, obtaining regular exercise, and cessation of smoking.  The patient  is aware that without maximal medical management the underlying atherosclerotic disease process will progress, limiting the benefit of any interventions.  He is taking Brilinta, 81 mg ASA, and a statin daily.   Thank you for allowing [place] to participate in this patient's care.  Charisse March, RN,  MSN, FNP-C [facility] Office: [phone]  07/18/2018, 11:35 AM  Clinic MD: Darrick Penna

## 2018-07-18 NOTE — Patient Instructions (Addendum)
Steps to Quit Smoking Smoking tobacco can be bad for your health. It can also affect almost every organ in your body. Smoking puts you and people around you at risk for many serious long-lasting (chronic) diseases. Quitting smoking is hard, but it is one of the best things that you can do for your health. It is never too late to quit. What are the benefits of quitting smoking? When you quit smoking, you lower your risk for getting serious diseases and conditions. They can include:  Lung cancer or lung disease.  Heart disease.  Stroke.  Heart attack.  Not being able to have children (infertility).  Weak bones (osteoporosis) and broken bones (fractures).  If you have coughing, wheezing, and shortness of breath, those symptoms may get better when you quit. You may also get sick less often. If you are pregnant, quitting smoking can help to lower your chances of having a baby of low birth weight. What can I do to help me quit smoking? Talk with your doctor about what can help you quit smoking. Some things you can do (strategies) include:  Quitting smoking totally, instead of slowly cutting back how much you smoke over a period of time.  Going to in-person counseling. You are more likely to quit if you go to many counseling sessions.  Using resources and support systems, such as: ? Online chats with a counselor. ? Phone quitlines. ? Printed self-help materials. ? Support groups or group counseling. ? Text messaging programs. ? Mobile phone apps or applications.  Taking medicines. Some of these medicines may have nicotine in them. If you are pregnant or breastfeeding, do not take any medicines to quit smoking unless your doctor says it is okay. Talk with your doctor about counseling or other things that can help you.  Talk with your doctor about using more than one strategy at the same time, such as taking medicines while you are also going to in-person counseling. This can help make  quitting easier. What things can I do to make it easier to quit? Quitting smoking might feel very hard at first, but there is a lot that you can do to make it easier. Take these steps:  Talk to your family and friends. Ask them to support and encourage you.  Call phone quitlines, reach out to support groups, or work with a counselor.  Ask people who smoke to not smoke around you.  Avoid places that make you want (trigger) to smoke, such as: ? Bars. ? Parties. ? Smoke-break areas at work.  Spend time with people who do not smoke.  Lower the stress in your life. Stress can make you want to smoke. Try these things to help your stress: ? Getting regular exercise. ? Deep-breathing exercises. ? Yoga. ? Meditating. ? Doing a body scan. To do this, close your eyes, focus on one area of your body at a time from head to toe, and notice which parts of your body are tense. Try to relax the muscles in those areas.  Download or buy apps on your mobile phone or tablet that can help you stick to your quit plan. There are many free apps, such as QuitGuide from the CDC (Centers for Disease Control and Prevention). You can find more support from smokefree.gov and other websites.  This information is not intended to replace advice given to you by your health care provider. Make sure you discuss any questions you have with your health care provider. Document Released: 09/16/2009 Document   Revised: 07/18/2016 Document Reviewed: 04/06/2015 Elsevier Interactive Patient Education  2018 Elsevier Inc.     Peripheral Vascular Disease Peripheral vascular disease (PVD) is a disease of the blood vessels that are not part of your heart and brain. A simple term for PVD is poor circulation. In most cases, PVD narrows the blood vessels that carry blood from your heart to the rest of your body. This can result in a decreased supply of blood to your arms, legs, and internal organs, like your stomach or kidneys.  However, it most often affects a person's lower legs and feet. There are two types of PVD.  Organic PVD. This is the more common type. It is caused by damage to the structure of blood vessels.  Functional PVD. This is caused by conditions that make blood vessels contract and tighten (spasm).  Without treatment, PVD tends to get worse over time. PVD can also lead to acute ischemic limb. This is when an arm or limb suddenly has trouble getting enough blood. This is a medical emergency. Follow these instructions at home:  Take medicines only as told by your doctor.  Do not use any tobacco products, including cigarettes, chewing tobacco, or electronic cigarettes. If you need help quitting, ask your doctor.  Lose weight if you are overweight, and maintain a healthy weight as told by your doctor.  Eat a diet that is low in fat and cholesterol. If you need help, ask your doctor.  Exercise regularly. Ask your doctor for some good activities for you.  Take good care of your feet. ? Wear comfortable shoes that fit well. ? Check your feet often for any cuts or sores. Contact a doctor if:  You have cramps in your legs while walking.  You have leg pain when you are at rest.  You have coldness in a leg or foot.  Your skin changes.  You are unable to get or have an erection (erectile dysfunction).  You have cuts or sores on your feet that are not healing. Get help right away if:  Your arm or leg turns cold and blue.  Your arms or legs become red, warm, swollen, painful, or numb.  You have chest pain or trouble breathing.  You suddenly have weakness in your face, arm, or leg.  You become very confused or you cannot speak.  You suddenly have a very bad headache.  You suddenly cannot see. This information is not intended to replace advice given to you by your health care provider. Make sure you discuss any questions you have with your health care provider. Document Released:  02/14/2010 Document Revised: 04/27/2016 Document Reviewed: 04/30/2014 Elsevier Interactive Patient Education  2017 Elsevier Inc.  

## 2018-07-22 ENCOUNTER — Other Ambulatory Visit: Payer: Self-pay

## 2018-07-22 DIAGNOSIS — Z95820 Peripheral vascular angioplasty status with implants and grafts: Secondary | ICD-10-CM

## 2018-07-22 DIAGNOSIS — I739 Peripheral vascular disease, unspecified: Secondary | ICD-10-CM

## 2018-07-26 ENCOUNTER — Ambulatory Visit: Payer: Medicaid Other | Admitting: Family

## 2018-07-26 ENCOUNTER — Encounter (HOSPITAL_COMMUNITY): Payer: Medicaid Other

## 2018-07-30 ENCOUNTER — Ambulatory Visit: Payer: Medicaid Other | Admitting: Cardiology

## 2018-09-02 ENCOUNTER — Encounter: Payer: Self-pay | Admitting: Cardiology

## 2018-09-02 ENCOUNTER — Ambulatory Visit (INDEPENDENT_AMBULATORY_CARE_PROVIDER_SITE_OTHER): Payer: Medicaid Other | Admitting: Cardiology

## 2018-09-02 VITALS — BP 102/62 | HR 78 | Ht 69.0 in | Wt 210.8 lb

## 2018-09-02 DIAGNOSIS — I255 Ischemic cardiomyopathy: Secondary | ICD-10-CM

## 2018-09-02 DIAGNOSIS — I10 Essential (primary) hypertension: Secondary | ICD-10-CM

## 2018-09-02 DIAGNOSIS — E782 Mixed hyperlipidemia: Secondary | ICD-10-CM

## 2018-09-02 DIAGNOSIS — I2510 Atherosclerotic heart disease of native coronary artery without angina pectoris: Secondary | ICD-10-CM | POA: Diagnosis not present

## 2018-09-02 DIAGNOSIS — I739 Peripheral vascular disease, unspecified: Secondary | ICD-10-CM

## 2018-09-02 DIAGNOSIS — Z720 Tobacco use: Secondary | ICD-10-CM

## 2018-09-02 MED ORDER — TICAGRELOR 60 MG PO TABS: 60.0000 mg | ORAL_TABLET | Freq: Two times a day (BID) | ORAL | 3 refills | Status: DC

## 2018-09-02 NOTE — Progress Notes (Signed)
09/02/2018 Ryan [name]   1966/04/12  [id number]  Primary Physician Fleet Contras, MD Primary Cardiologist: Dr. [name]   Reason for Visit/CC: F/u for CAD   HPI: Ryan [name] is a 52 year old male with a history of CAD, peripheral vascular disease, poorly controlled diabetes mellitus, hypertension, hyperlipidemia and tobacco use presenting to clinic today for routine follow-up given history of CAD. He has been followed here by Dr. [name]. He had an anterior STEMI in January 2013 with cardiac arrest and found to have an occluded mid LAD treated with a drug eluting stent. The third OM branch was chronically occluded and the RCA had mild disease. His post MI LVEF was 35% but this improved and last echo in March 2013 with normal LV systolic function. Cardiac cath in November 2017 with severe stent restenosis in the LAD, treated with a cutting balloon angioplasty.  Over a year ago July 31, 2017 he had an anterior wall STEMI which was treated at [facility]). Per records, he presented with complaint of chest pain with radiation to right arm, c/w previous MI pain. EKG showed STEMI anterior wall. Code STEMI was activated.  He was take to the cath lab. Procedure was performed by Dr. Rhona Leavens at [facility]. He was found to have acute occlusion of the prox LAD, just proximal to old stents. This was sucessfully treated with PCI/ Xience Drug Eluting stent. He also underwent POBA to the ostial diag thru stent strut. EF was 40%. Troponin peaked at 10. He was placed on DAPT w/ ASA + Brilinta (Previously on Plavix, discontinued).  Recently, in April of this year, he underwent vascular surgery by Dr. Myra Gianotti.  He underwent right femoral to AK popliteal bypass with saphenous vein and right femoral endarterectomy.   He presents to clinic for routine cardiac follow-up.  He reports that he has done well both from a cardiac and vascular standpoint.  He denies any exertional chest pain and no right  heart arm pain which is his anginal equivalent.  He also denies dyspnea.  No claudication.  Unfortunately he continues to smoke cigarettes but is trying to wean down.  He is now down to 4 to 5 cigarettes a day.  His blood pressure is well controlled on current medical regimen at 102/62.  Heart rate is in the 70s.  His diabetes however has recently been poorly controlled.  His last hemoglobin A1c on file from February 2019 was 11.3.  His PCP tried prescribing Jardiance however the patient reports that he was unable to take as this as it is not covered by his insurance and he is unable to afford.  He notes that his insulin regimen has been adjusted.  He also states that he had recent blood work done by his PCP at [facility] and believes that a lipid panel was obtained however I do not have these results on file for my review.   Current Meds  Medication Sig  . aspirin EC 81 MG tablet Take 1 tablet (81 mg total) by mouth daily.  . calcium carbonate (TUMS - DOSED IN MG ELEMENTAL CALCIUM) 500 MG chewable tablet Chew 2 tablets by mouth daily as needed for indigestion or heartburn.   . carvedilol (COREG) 6.25 MG tablet Take 1 tablet (6.25 mg total) by mouth 2 (two) times daily.  . cyclobenzaprine (FLEXERIL) 10 MG tablet Take 10 mg by mouth 2 (two) times daily as needed for muscle spasms.   . enalapril (VASOTEC) 5 MG tablet TAKE ONE  TABLET BY MOUTH TWICE DAILY  . Insulin Glargine (LANTUS SOLOSTAR) 100 UNIT/ML Solostar Pen Inject 75 Units into the skin at bedtime.   . isosorbide mononitrate (IMDUR) 30 MG 24 hr tablet Take 1 tablet (30 mg total) by mouth daily. Please keep upcoming appt in August for future refills. Thank you  . oxyCODONE-acetaminophen (PERCOCET) 10-325 MG tablet Take 1 tablet by mouth every 4 (four) hours as needed for pain.  . simvastatin (ZOCOR) 80 MG tablet Take 80 mg by mouth at bedtime.  . ticagrelor (BRILINTA) 90 MG TABS tablet Take 90 mg by mouth 2 (two) times daily.   . traZODone  (DESYREL) 100 MG tablet Take 100 mg by mouth at bedtime as needed for sleep.   Allergies  Allergen Reactions  . Hydrocodone Hives and Nausea And Vomiting   Past Medical History:  Diagnosis Date  . Acute myocardial infarction of other anterior wall, subsequent episode of care 12/12/2011  . Anal fissure   . Atherosclerosis of native artery of both lower extremities with intermittent claudication (HCC) 04/01/2018  . Bruises easily    d/t being on Effient  . CAD (coronary artery disease)    A. Acute Ant STEMI 12/12/2011   . Cardiomyopathy secondary 12/26/2011  . Chronic total occlusion of artery of the extremities (HCC) 02/26/2012  . Claudication (HCC) 12/26/2011  . Coronary atherosclerosis of native coronary artery    a. ant STEMI with cardiac arrest 2013 s/p DES to mLAD. b. [place] 10/2016 s/p PTCA to mLAD.  . Diabetes mellitus without complication (HCC)   . Essential hypertension 10/23/2016  . GERD (gastroesophageal reflux disease)    "takes tums"  . History of blood transfusion    no abnormal reaction noted  . History of kidney stones   . Hypertension   . Ischemic cardiomyopathy    a. EF 35% in 02/2012 at time of acute MI, improved to normal on subsequent imaging.  . MI (myocardial infarction) (HCC)    AMI 1/13 - complicated by VT/Tosades  . Mixed hyperlipidemia   . Numbness and tingling of right arm 03/31/2013  . Occlusion and stenosis of carotid artery without mention of cerebral infarction 08/26/2012  . PAD (peripheral artery disease) (HCC)    followed by Vascular - aortobifem bypass 01/2013 & left carotid-subclavian artery bypass 04/2013  . Peripheral vascular disease, unspecified (HCC) 12/16/2012  . PVD (peripheral vascular disease) (HCC) 03/31/2013  . Rectal polyp   . Subclavian steal syndrome 08/11/2013  . Uncontrolled diabetes mellitus (HCC) 10/23/2016   Family History  Problem Relation Age of Onset  . Cancer Father        Lung  . Diabetes Mother   . COPD Mother   . Cancer  Maternal Uncle        Colon   Past Surgical History:  Procedure Laterality Date  . ABDOMINAL AORTOGRAM W/LOWER EXTREMITY N/A 06/26/2017   Procedure: Abdominal Aortogram w/Lower Extremity;  Surgeon: Nada Libman, MD;  Location: [facility] INVASIVE CV LAB;  Service: Cardiovascular;  Laterality: N/A;  . ANAL FISSURECTOMY    . AORTA - BILATERAL FEMORAL ARTERY BYPASS GRAFT N/A 01/16/2013   Procedure: AORTA BIFEMORAL BYPASS GRAFT;  Surgeon: Nada Libman, MD;  Location: [facility] OR;  Service: Vascular;  Laterality: N/A;  . APPENDECTOMY    . CARDIAC CATHETERIZATION N/A 10/23/2016   Procedure: Left Heart Cath and Coronary Angiography;  Surgeon: Kathleene Hazel, MD;  Location: [facility] INVASIVE CV LAB;  Service: Cardiovascular;  Laterality: N/A;  . CARDIAC CATHETERIZATION N/A  10/23/2016   Procedure: Coronary Balloon Angioplasty;  Surgeon: Kathleene Hazel, MD;  Location: [facility] INVASIVE CV LAB;  Service: Cardiovascular;  Laterality: N/A;  . CAROTID-SUBCLAVIAN BYPASS GRAFT Left 04/03/2013   Procedure: BYPASS GRAFT CAROTID-SUBCLAVIAN;  Surgeon: Nada Libman, MD;  Location: [facility] OR;  Service: Vascular;  Laterality: Left;  . CIRCUMCISION N/A 12/15/2015   Procedure: CIRCUMCISION ADULT;  Surgeon: Malen Gauze, MD;  Location: [facility] ORS;  Service: Urology;  Laterality: N/A;  . COLONOSCOPY    . CORONARY ANGIOPLASTY     stent placed Dec 12, 2011 and 2018  . DIAGNOSTIC LAPAROSCOPY    . ENDARTERECTOMY FEMORAL Right 03/13/2018   Procedure: FEMORAL ENDARTERECTOMY RIGHT;  Surgeon: Nada Libman, MD;  Location: [facility] OR;  Service: Vascular;  Laterality: Right;  . FEMORAL-POPLITEAL BYPASS GRAFT Right 03/13/2018   Procedure: FEMORAL-POPLITEAL ARTERY BYPASS WITH NON-REVERSE VEIN RIGHT;  Surgeon: Nada Libman, MD;  Location: [facility] OR;  Service: Vascular;  Laterality: Right;  . GROIN DISSECTION Right 03/13/2018   Procedure: RE-DO COMMON FEMORAL ARTERY EXPOSURE;  Surgeon: Nada Libman, MD;  Location: [facility] OR;  Service:  Vascular;  Laterality: Right;  . I&D EXTREMITY Left 04/18/2013   Procedure: debridement of left neck lymphocele;  Surgeon: Nada Libman, MD;  Location: [facility] OR;  Service: Vascular;  Laterality: Left;  I and D of left neck  . I&D EXTREMITY Right 11/21/2014   Procedure: IRRIGATION AND DEBRIDEMENT RIGHT HAND;  Surgeon: Dominica Severin, MD;  Location: [facility] OR;  Service: Orthopedics;  Laterality: Right;  . LEFT HEART CATHETERIZATION WITH CORONARY ANGIOGRAM N/A 12/12/2011   Procedure: LEFT HEART CATHETERIZATION WITH CORONARY ANGIOGRAM;  Surgeon: Kathleene Hazel, MD;  Location: [facility] CATH LAB;  Service: Cardiovascular;  Laterality: N/A;  . LOWER EXTREMITY ANGIOGRAM N/A 01/31/2012   Procedure: LOWER EXTREMITY ANGIOGRAM;  Surgeon: Kathleene Hazel, MD;  Location: [facility] CATH LAB;  Service: Cardiovascular;  Laterality: N/A;  . LOWER EXTREMITY ANGIOGRAPHY N/A 04/23/2018   Procedure: LOWER EXTREMITY ANGIOGRAPHY;  Surgeon: Nada Libman, MD;  Location: [facility] INVASIVE CV LAB;  Service: Cardiovascular;  Laterality: N/A;  . PERCUTANEOUS CORONARY STENT INTERVENTION (PCI-S) Right 12/12/2011   Procedure: PERCUTANEOUS CORONARY STENT INTERVENTION (PCI-S);  Surgeon: Kathleene Hazel, MD;  Location: [facility] CATH LAB;  Service: Cardiovascular;  Laterality: Right;  . PERIPHERAL VASCULAR INTERVENTION Left 04/23/2018   Procedure: PERIPHERAL VASCULAR INTERVENTION;  Surgeon: Nada Libman, MD;  Location: [facility] INVASIVE CV LAB;  Service: Cardiovascular;  Laterality: Left;  superficial femoral  . REPAIR EXTENSOR TENDON Right 11/21/2014   Procedure: WITH REPAIR/RECONSTRUCTION OF EXTENSOR TENDONS AS NEEDED;  Surgeon: Dominica Severin, MD;  Location: [facility] OR;  Service: Orthopedics;  Laterality: Right;   Social History   Socioeconomic History  . Marital status: Divorced    Spouse name: Not on file  . Number of children: Not on file  . Years of education: Not on file  . Highest education level: Not on file  Occupational History     Employer: OTHER  Social Needs  . Financial resource strain: Not on file  . Food insecurity:    Worry: Not on file    Inability: Not on file  . Transportation needs:    Medical: Not on file    Non-medical: Not on file  Tobacco Use  . Smoking status: Current Every Day Smoker    Packs/day: 0.25    Years: 25.00    Pack years: 6.25    Types: Cigarettes  . Smokeless tobacco: Never Used  .  Tobacco comment: 1-2 cigs a day  Substance and Sexual Activity  . Alcohol use: No  . Drug use: No  . Sexual activity: Yes  Lifestyle  . Physical activity:    Days per week: Not on file    Minutes per session: Not on file  . Stress: Not on file  Relationships  . Social connections:    Talks on phone: Not on file    Gets together: Not on file    Attends religious service: Not on file    Active member of club or organization: Not on file    Attends meetings of clubs or organizations: Not on file    Relationship status: Not on file  . Intimate partner violence:    Fear of current or ex partner: Not on file    Emotionally abused: Not on file    Physically abused: Not on file    Forced sexual activity: Not on file  Other Topics Concern  . Not on file  Social History Narrative   Disabled - no longer works     Review of Systems: General: negative for chills, fever, night sweats or weight changes.  Cardiovascular: negative for chest pain, dyspnea on exertion, edema, orthopnea, palpitations, paroxysmal nocturnal dyspnea or shortness of breath Dermatological: negative for rash Respiratory: negative for cough or wheezing Urologic: negative for hematuria Abdominal: negative for nausea, vomiting, diarrhea, bright red blood per rectum, melena, or hematemesis Neurologic: negative for visual changes, syncope, or dizziness All other systems reviewed and are otherwise negative except as noted above.   Physical Exam:  Blood pressure 102/62, pulse 78, height 5\' 9"  (1.753 m), weight 210 lb 12.8 oz (95.6  kg), SpO2 98 %.  General appearance: alert, cooperative and no distress Neck: no carotid bruit and no JVD Lungs: clear to auscultation bilaterally Heart: regular rate and rhythm, S1, S2 normal, no murmur, click, rub or gallop Extremities: extremities normal, atraumatic, no cyanosis or edema Pulses: 2+ and symmetric Skin: Skin color, texture, turgor normal. No rashes or lesions Neurologic: Grossly normal  EKG not performed  -- personally reviewed   ASSESSMENT AND PLAN:   1. CAD: full details outlined above.  History of multiple MIs and PCIs.  Last intervention was July 31, 2017 for treatment of anterior STEMI at [facility].  PCI/ Xience Drug Eluting stent to proximal LAD. He also underwent POBA to the ostial diag thru stent strut.  He denies any anginal symptomatology including no exertional chest pain and no right arm pain which is his anginal equivalent.  We will continue on medical therapy w/ dual antiplatelet therapy along with beta-blocker statin, long-acting nitrate and ACE inhibitor.  Now that he is status post 1 year drug-eluting stent placement, we discussed transition from Brilinta to Plavix however the patient reports that he would prefer to continue on Brilinta as he was taking Plavix at the time of his last MI.  Will reduce dose down from 90 mg twice daily to 60 mg twice daily.  Continue aspirin.  LDL goal is less than 70 mg/dL.  He needs better control of his diabetes.  Patient also encouraged to stop smoking.  2.  PVD: Followed by Dr. Myra Gianotti.  Status post recent R femoral to AK popliteal bypass with saphenous vein and right femoral endarterectomy 03/2018.  He denies claudication.  Smoking cessation strongly advised.  3. IDDM: managed by PCP.   4. HTN: controlled on current regimen.   5. Tobacco Abuse: smoking cessation strongly advised.  6. HLD: LDL goal <70 mg/dL. Pt reports full compliance with simvastatin and reports PCP recently checked labs. We will request  recent labs be faxed to [place] for review.   7. Ischemic CM: EF at time of anterior MI 2018 was 40%. He denies dyspnea. Euvolemic on exam. Continue BB, ACEi and LA nitrate.   Follow-Up w/ Dr. [name] in 6 months.    Adalid Beckmann Delmer Islam, MHS [facility] 09/02/2018 9:04 AM

## 2018-09-02 NOTE — Addendum Note (Signed)
Addended by: Burnetta Sabin on: 09/02/2018 09:47 AM   Modules accepted: Orders

## 2018-09-02 NOTE — Patient Instructions (Addendum)
Medication Instructions:  Your physician has recommended you make the following change in your medication:  1.  DECREASE the Brilinta to 60 mg taking 1 tablet twice a day 2.  CONTINUE the Aspirin   Labwork: None ordered  Testing/Procedures: None ordered  Follow-Up: Your physician wants you to follow-up in: 6 MONTHS WITH DR. [name]  You will receive a reminder letter in the mail two months in advance. If you don't receive a letter, please call our office to schedule the follow-up appointment.   Any Other Special Instructions Will Be Listed Below (If Applicable). If you develop any Angina Pain, please call and let us know.    If you need a refill on your cardiac medications before your next appointment, please call your pharmacy.

## 2018-10-18 ENCOUNTER — Other Ambulatory Visit: Payer: Medicaid Other

## 2018-10-18 ENCOUNTER — Encounter (HOSPITAL_COMMUNITY): Payer: Medicaid Other

## 2018-10-18 ENCOUNTER — Ambulatory Visit: Payer: Medicaid Other | Admitting: Family

## 2018-12-12 ENCOUNTER — Encounter (HOSPITAL_COMMUNITY): Payer: Medicaid Other

## 2018-12-12 ENCOUNTER — Ambulatory Visit: Payer: Medicaid Other | Admitting: Family

## 2018-12-31 ENCOUNTER — Other Ambulatory Visit: Payer: Self-pay | Admitting: Cardiovascular Disease

## 2019-01-21 ENCOUNTER — Ambulatory Visit: Payer: Medicaid Other | Admitting: Family

## 2019-01-21 ENCOUNTER — Encounter (HOSPITAL_COMMUNITY): Payer: Medicaid Other

## 2019-02-18 ENCOUNTER — Inpatient Hospital Stay (HOSPITAL_COMMUNITY): Admission: RE | Admit: 2019-02-18 | Payer: Medicaid Other | Source: Ambulatory Visit

## 2019-02-18 ENCOUNTER — Encounter (HOSPITAL_COMMUNITY): Payer: Medicaid Other

## 2019-02-18 ENCOUNTER — Ambulatory Visit: Payer: Medicaid Other | Admitting: Family

## 2019-02-25 ENCOUNTER — Telehealth: Payer: Self-pay

## 2019-02-25 NOTE — Telephone Encounter (Signed)
   Cardiac Questionnaire:    Since your last visit or hospitalization:    1. Have you been having new or worsening chest pain? NO   2. Have you been having new or worsening shortness of breath? NO 3. Have you been having new or worsening leg swelling, wt gain, or increase in abdominal girth (pants fitting more tightly)? NO   4. Have you had any passing out spells? NO    *A YES to any of these questions would result in the appointment being kept. *If all the answers to these questions are NO, we should indicate that given the current situation regarding the worldwide coronarvirus pandemic, at the recommendation of the CDC, we are looking to limit gatherings in our waiting area, and thus will reschedule their appointment beyond four weeks from today.   _____________   COVID-19 Pre-Screening Questions:          

## 2019-02-25 NOTE — Telephone Encounter (Signed)
-----   Message from Allayne Butcher, [place] sent at 02/25/2019  3:49 PM EDT ----- Regarding: reschedule Chart reviewed. Appt is for his 6 month f/u. Please call to reschedule visit. If he has been stable w/o active cardiac symptoms, can reschedule as a level 3 priority. If he has had issues or concerns that need to be addressed, lets arrange as a WebEx or telephone visit.

## 2019-03-03 ENCOUNTER — Ambulatory Visit: Payer: Medicaid Other | Admitting: Cardiology

## 2019-03-07 ENCOUNTER — Telehealth: Payer: Self-pay

## 2019-03-07 NOTE — Telephone Encounter (Signed)
   Cardiac Questionnaire:    Since your last visit or hospitalization:    1. Have you been having new or worsening chest pain? no   2. Have you been having new or worsening shortness of breath? no 3. Have you been having new or worsening leg swelling, wt gain, or increase in abdominal girth (pants fitting more tightly)? no   4. Have you had any passing out spells? no    *A YES to any of these questions would result in the appointment being kept. *If all the answers to these questions are NO, we should indicate that given the current situation regarding the worldwide coronarvirus pandemic, at the recommendation of the CDC, we are looking to limit gatherings in our waiting area, and thus will reschedule their appointment beyond four weeks from today.   _____________     Patient Consented for Virtual Visit by Phone scheduled 4/6 @ 9:00 with Robbie Lis, PA

## 2019-03-10 ENCOUNTER — Other Ambulatory Visit: Payer: Self-pay

## 2019-03-10 ENCOUNTER — Encounter: Payer: Self-pay | Admitting: Cardiology

## 2019-03-10 ENCOUNTER — Telehealth (INDEPENDENT_AMBULATORY_CARE_PROVIDER_SITE_OTHER): Payer: Medicaid Other | Admitting: Cardiology

## 2019-03-10 VITALS — Ht 69.0 in | Wt 216.0 lb

## 2019-03-10 DIAGNOSIS — F17210 Nicotine dependence, cigarettes, uncomplicated: Secondary | ICD-10-CM | POA: Diagnosis not present

## 2019-03-10 DIAGNOSIS — E1159 Type 2 diabetes mellitus with other circulatory complications: Secondary | ICD-10-CM

## 2019-03-10 DIAGNOSIS — I10 Essential (primary) hypertension: Secondary | ICD-10-CM | POA: Diagnosis not present

## 2019-03-10 DIAGNOSIS — I25118 Atherosclerotic heart disease of native coronary artery with other forms of angina pectoris: Secondary | ICD-10-CM

## 2019-03-10 DIAGNOSIS — Z794 Long term (current) use of insulin: Secondary | ICD-10-CM

## 2019-03-10 NOTE — Patient Instructions (Signed)
Medication Instructions:  none If you need a refill on your cardiac medications before your next appointment, please call your pharmacy.   Lab work:August at office visit wit Dr [name] Fasting Lipid Panel Hepatic Function Test If you have labs (blood work) drawn today and your tests are completely normal, you will receive your results only by: Marland Kitchen MyChart Message (if you have MyChart) OR . A paper copy in the mail If you have any lab test that is abnormal or we need to change your treatment, we will call you to review the results.  Testing/Procedures: none  Follow-Up: August with Dr [name] At [facility], you and your health needs are our priority.  As part of our continuing mission to provide you with exceptional heart care, we have created designated Provider Care Teams.  These Care Teams include your primary Cardiologist (physician) and Advanced Practice Providers (APPs -  Physician Assistants and Nurse Practitioners) who all work together to provide you with the care you need, when you need it.  Any Other Special Instructions Will Be Listed Below (If Applicable).

## 2019-03-10 NOTE — Progress Notes (Signed)
Virtual Visit via Telephone Note   This visit type was conducted due to national recommendations for restrictions regarding the COVID-19 Pandemic (e.g. social distancing) in an effort to limit this patient's exposure and mitigate transmission in our community.  Due to his co-morbid illnesses, this patient is at least at moderate risk for complications without adequate follow up.  This format is felt to be most appropriate for this patient at this time.  The patient did not have access to video technology/had technical difficulties with video requiring transitioning to audio format only (telephone).  All issues noted in this document were discussed and addressed.  No physical exam could be performed with this format.  Please refer to the patient's chart for his  consent to telehealth for [facility].   Evaluation Performed:  Follow-up visit  Date:  03/10/2019   ID:  Ryan [name], DOB 1966-04-10, MRN [id number]  Patient Location: Home  Provider Location: Office  PCP:  Nolene Ebbs, MD  Cardiologist:  [name], MD  Electrophysiologist:  None   Chief Complaint: Follow-up for coronary artery disease  History of Present Illness:    Ryan [name] is a 53 y.o. male who presents via audio/video conferencing for a telehealth visit today.    The patient does not have symptoms concerning for COVID-19 infection (fever, chills, cough, or new shortness of breath).   He has a history of CAD, peripheral vascular disease, poorly controlled insulin-dependent diabetes mellitus, hypertension, hyperlipidemia and tobacco. He has been followed here by Dr. Angelena Form.He had an anterior STEMI in January 2013 with cardiac arrest and found to have an occluded mid LAD treated with a drug eluting stent. The third OM branch was chronically occluded and the RCA had mild disease. His post MI LVEF was 35% but this improved and last echo in March 2013 with normal LV systolic function. Cardiac cath in  November 2017 with severe stent restenosis in the LAD, treated with a cutting balloon angioplasty.    On July 31, 2017, he had an anterior wall STEMI which was treated at [facility]). Per records, he presented with complaint of chest pain with radiation to right arm, c/w previous MI pain. EKG showed STEMI anterior wall. Code STEMI was activated.He was take to the cath lab. Procedure was performed by Dr. Wyline Copas at [facility]. He was found to have acute occlusion of the prox LAD, just proximal to old stents. This was sucessfully treated with PCI/ Xience Drug Eluting stent. He also underwent POBA to the ostial diag thru stent strut. EF was 40%. Troponin peaked at 10. He was placed on DAPT w/ ASA + Brilinta (Previously on Plavix, discontinued).  He is also required surgical revascularization for peripheral vascular disease.  In April 2019, he underwent vascular surgery by Dr. Trula Slade.  He underwent right femoral to AK popliteal bypass with saphenous vein and right femoral endarterectomy.   Today in follow-up he reports that he has done fairly well from a cardiovascular standpoint.  He denies any anterior/substernal chest pain but does on occasion have some mild right arm discomfort with moderate to heavy physical activity that immediately resolves after 1 sublingual nitroglycerin.  This does not happen often.  No resting symptoms and no symptoms with light activities/ADLs.  He denies any associated dyspnea.  No exertional symptoms.  Also denies palpitations, lightheadedness, dizziness, syncope/near syncope.  He reports full medication compliance.  He is currently chest pain-free.  He remains on dual antiplatelet therapy with aspirin and Brilinta  along with long-acting nitrate, Imdur 30 mg daily, plus beta-blocker, statin and ACE inhibitor.  He reports full compliance with Imdur.  Unfortunately he does not home have a home blood pressure cuff at home thus we do not have vital signs  readings on him today.  He understands that if he has more frequent arm pain or if he develops any chest pain and is having to take more nitroglycerin to notify our office.  From a peripheral vascular disease standpoint, he reports that he has not had any significant limitations.  He denies any significant claudication.  Unfortunately he continues to smoke about 1 to 2 cigarettes a day.  His diabetes in the past has been poorly controlled.  He reports full compliance with his insulin regimen.   Past Medical History:  Diagnosis Date   Acute myocardial infarction of other anterior wall, subsequent episode of care 12/12/2011   Anal fissure    Atherosclerosis of native artery of both lower extremities with intermittent claudication ([place]) 04/01/2018   Bruises easily    d/t being on Effient   CAD (coronary artery disease)    A. Acute Ant STEMI 12/12/2011    Cardiomyopathy secondary 12/26/2011   Chronic total occlusion of artery of the extremities ([place]) 02/26/2012   Claudication ([place]) 12/26/2011   Coronary atherosclerosis of native coronary artery    a. ant STEMI with cardiac arrest 2013 s/p DES to mLAD. b. [place] 10/2016 s/p PTCA to mLAD.   Diabetes mellitus without complication ([place])    Essential hypertension 10/23/2016   GERD (gastroesophageal reflux disease)    "takes tums"   History of blood transfusion    no abnormal reaction noted   History of kidney stones    Hypertension    Ischemic cardiomyopathy    a. EF 35% in 02/2012 at time of acute MI, improved to normal on subsequent imaging.   MI (myocardial infarction) ([place])    AMI 3/61 - complicated by VT/Tosades   Mixed hyperlipidemia    Numbness and tingling of right arm 03/31/2013   Occlusion and stenosis of carotid artery without mention of cerebral infarction 08/26/2012   PAD (peripheral artery disease) ([place])    followed by Vascular - aortobifem bypass 01/2013 & left carotid-subclavian artery bypass 04/2013   Peripheral  vascular disease, unspecified ([place]) 12/16/2012   PVD (peripheral vascular disease) ([place]) 03/31/2013   Rectal polyp    Subclavian steal syndrome 08/11/2013   Uncontrolled diabetes mellitus ([place]) 10/23/2016   Past Surgical History:  Procedure Laterality Date   ABDOMINAL AORTOGRAM W/LOWER EXTREMITY N/A 06/26/2017   Procedure: Abdominal Aortogram w/Lower Extremity;  Surgeon: Serafina Mitchell, MD;  Location: [place] [facility];  Service: Cardiovascular;  Laterality: N/A;   ANAL FISSURECTOMY     AORTA - BILATERAL FEMORAL ARTERY BYPASS GRAFT N/A 01/16/2013   Procedure: AORTA BIFEMORAL BYPASS GRAFT;  Surgeon: Serafina Mitchell, MD;  Location: [place];  Service: Vascular;  Laterality: N/A;   APPENDECTOMY     CARDIAC CATHETERIZATION N/A 10/23/2016   Procedure: Left Heart Cath and Coronary Angiography;  Surgeon: Burnell Blanks, MD;  Location: [place] CV LAB;  Service: Cardiovascular;  Laterality: N/A;   CARDIAC CATHETERIZATION N/A 10/23/2016   Procedure: Coronary Balloon Angioplasty;  Surgeon: Burnell Blanks, MD;  Location: [place] CV [facility];  Service: Cardiovascular;  Laterality: N/A;   CAROTID-SUBCLAVIAN BYPASS GRAFT Left 04/03/2013   Procedure: BYPASS GRAFT CAROTID-SUBCLAVIAN;  Surgeon: Serafina Mitchell, MD;  Location: [place];  Service: Vascular;  Laterality:  Left;   CIRCUMCISION N/A 12/15/2015   Procedure: CIRCUMCISION ADULT;  Surgeon: Cleon Gustin, MD;  Location: [facility];  Service: Urology;  Laterality: N/A;   COLONOSCOPY     CORONARY ANGIOPLASTY     stent placed Dec 12, 2011 and 2018   DIAGNOSTIC LAPAROSCOPY     ENDARTERECTOMY FEMORAL Right 03/13/2018   Procedure: FEMORAL ENDARTERECTOMY RIGHT;  Surgeon: Serafina Mitchell, MD;  Location: [facility] OR;  Service: Vascular;  Laterality: Right;   FEMORAL-POPLITEAL BYPASS GRAFT Right 03/13/2018   Procedure: FEMORAL-POPLITEAL ARTERY BYPASS WITH NON-REVERSE VEIN RIGHT;  Surgeon: Serafina Mitchell, MD;  Location: [facility] OR;  Service:  Vascular;  Laterality: Right;   GROIN DISSECTION Right 03/13/2018   Procedure: RE-DO COMMON FEMORAL ARTERY EXPOSURE;  Surgeon: Serafina Mitchell, MD;  Location: [facility] OR;  Service: Vascular;  Laterality: Right;   I&D EXTREMITY Left 04/18/2013   Procedure: debridement of left neck lymphocele;  Surgeon: Serafina Mitchell, MD;  Location: [place];  Service: Vascular;  Laterality: Left;  I and D of left neck   I&D EXTREMITY Right 11/21/2014   Procedure: IRRIGATION AND DEBRIDEMENT RIGHT HAND;  Surgeon: Roseanne Kaufman, MD;  Location: [place];  Service: Orthopedics;  Laterality: Right;   LEFT HEART CATHETERIZATION WITH CORONARY ANGIOGRAM N/A 12/12/2011   Procedure: LEFT HEART CATHETERIZATION WITH CORONARY ANGIOGRAM;  Surgeon: Burnell Blanks, MD;  Location: [facility] CATH LAB;  Service: Cardiovascular;  Laterality: N/A;   LOWER EXTREMITY ANGIOGRAM N/A 01/31/2012   Procedure: LOWER EXTREMITY ANGIOGRAM;  Surgeon: Burnell Blanks, MD;  Location: [facility] CATH LAB;  Service: Cardiovascular;  Laterality: N/A;   LOWER EXTREMITY ANGIOGRAPHY N/A 04/23/2018   Procedure: LOWER EXTREMITY ANGIOGRAPHY;  Surgeon: Serafina Mitchell, MD;  Location: [place] CV LAB;  Service: Cardiovascular;  Laterality: N/A;   PERCUTANEOUS CORONARY STENT INTERVENTION (PCI-S) Right 12/12/2011   Procedure: PERCUTANEOUS CORONARY STENT INTERVENTION (PCI-S);  Surgeon: Burnell Blanks, MD;  Location: [facility] CATH LAB;  Service: Cardiovascular;  Laterality: Right;   PERIPHERAL VASCULAR INTERVENTION Left 04/23/2018   Procedure: PERIPHERAL VASCULAR INTERVENTION;  Surgeon: Serafina Mitchell, MD;  Location: [place] CV LAB;  Service: Cardiovascular;  Laterality: Left;  superficial femoral   REPAIR EXTENSOR TENDON Right 11/21/2014   Procedure: WITH REPAIR/RECONSTRUCTION OF EXTENSOR TENDONS AS NEEDED;  Surgeon: Roseanne Kaufman, MD;  Location: [place];  Service: Orthopedics;  Laterality: Right;     Current Meds  Medication Sig   aspirin EC 81 MG tablet  Take 1 tablet (81 mg total) by mouth daily.   carvedilol (COREG) 6.25 MG tablet Take 1 tablet (6.25 mg total) by mouth 2 (two) times daily.   cyclobenzaprine (FLEXERIL) 10 MG tablet Take 10 mg by mouth 2 (two) times daily as needed for muscle spasms.    enalapril (VASOTEC) 5 MG tablet TAKE ONE TABLET BY MOUTH TWICE DAILY   Insulin Glargine (LANTUS SOLOSTAR) 100 UNIT/ML Solostar Pen Inject 75 Units into the skin at bedtime.    isosorbide mononitrate (IMDUR) 30 MG 24 hr tablet TAKE 1 TABLET BY MOUTH EVERY DAY   oxyCODONE-acetaminophen (PERCOCET) 10-325 MG tablet Take 1 tablet by mouth every 4 (four) hours as needed for pain.   pantoprazole (PROTONIX) 40 MG tablet Take 40 mg by mouth daily.   simvastatin (ZOCOR) 80 MG tablet Take 80 mg by mouth at bedtime.   ticagrelor (BRILINTA) 60 MG TABS tablet Take 1 tablet (60 mg total) by mouth 2 (two) times daily.   traZODone (DESYREL) 100 MG tablet Take  100 mg by mouth at bedtime as needed for sleep.     Allergies:   Hydrocodone   Social History   Tobacco Use   Smoking status: Current Every Day Smoker    Packs/day: 0.25    Years: 25.00    Pack years: 6.25    Types: Cigarettes   Smokeless tobacco: Never Used   Tobacco comment: 1-2 cigs a day  Substance Use Topics   Alcohol use: No   Drug use: No     Family Hx: The patient's family history includes COPD in his mother; Cancer in his father and maternal uncle; Diabetes in his mother.  ROS:   Please see the history of present illness.     All other systems reviewed and are negative.   Prior CV studies:   The following studies were reviewed today:  Cardiac catheterization at [facility] 07/31/2017  Electronically signed by Rozann Lesches, MD(Interventional Physician)  on 07/31/2017 16:30  Angiographic Findings  Cardiac Arteries and Lesion Findings LMCA: 0%. LAD: Lesion on Prox LAD: Proximal subsection.100% stenosis 10 mm length reduced to 0%. Pre procedure TIMI 0  flow was noted. Post Procedure TIMI III flow was present. Poor run off was present. The lesion was diagnosed as High Risk (C). Devices used - Abbott 0.014" x 190cm BMW J-Tip PTCI Guidewire. Number of passes: 1. - 2.5x12 Euphora. 5 inflation(s) to a max pressure of: 14 atm. - 3.5 mm x 12 mm Xience Sierra DES Stent. 1 inflation(s) to a max pressure of: 12 atm. Lesion on 1st Diag: Ostial.80% stenosis 4 mm length reduced to 0%. Pre procedure TIMI 0 flow was noted. Post Procedure TIMI III flow was present. Poor run off was present. The lesion was diagnosed as High Risk (C). Bifurcation lesion. Devices used - Abbott 0.014" x 190cm BMW J-Tip PTCI Guidewire. Number of passes: 1. - 2.5x12 Euphora. 2 inflation(s) to a max pressure of: 6 atm. LCx: 0%. RCA: 0%.  Labs/Other Tests and Data Reviewed:    EKG:  An ECG dated 09/05/17 was personally reviewed today and demonstrated: Normal sinus rhythm with old septal and lateral infarct  Recent Labs: 03/14/2018: Platelets 190 04/23/2018: BUN 12; Creatinine, Ser 1.00; Hemoglobin 15.6; Potassium 3.8; Sodium 139   Recent Lipid Panel Lab Results  Component Value Date/Time   CHOL 180 11/09/2017 10:50 AM   TRIG 299 (H) 11/09/2017 10:50 AM   HDL 31 (L) 11/09/2017 10:50 AM   CHOLHDL 5.8 (H) 11/09/2017 10:50 AM   CHOLHDL 7.9 12/13/2011 06:07 AM   LDLCALC 89 11/09/2017 10:50 AM    Wt Readings from Last 3 Encounters:  03/10/19 216 lb (98 kg)  09/02/18 210 lb 12.8 oz (95.6 kg)  07/18/18 208 lb (94.3 kg)     Objective:    Vital Signs:  Ht 5' 9"  (1.753 m)    Wt 216 lb (98 kg)    BMI 31.90 kg/m    Well sounding male in no acute distress. Speaking in clear complete sentences.  Speech unlabored. Breathing unlabored.  ASSESSMENT & PLAN:    1.  Coronary artery disease w/ stable angina: History of myocardial infarction x2 with coronary stenting as outlined above in HPI. He denies any anterior/substernal chest pain but does on  occasion have some mild right arm discomfort with moderate to heavy physical activity that immediately resolves after 1 sublingual nitroglycerin.  This does not happen often.  No resting symptoms and no symptoms with light activities/ADLs.  He understands that if he has more  frequent arm pain or if he develops any chest pain and is having to take more nitroglycerin, to notify our office. Since he does not have a home blood pressure cuff and we do not have a blood pressure reading on him, I would be hesitant to instruct him to increase Imdur to 60 mg at this time.  If his symptoms worsen/become more unstable, he will notify us.  We can consider stress test versus repeat cardiac catheterization at that time.  For now, continue medical therapy w/ aspirin, Brilinta, beta-blocker, statin, ACE inhibitor and long-acting nitrate.  2.  Peripheral vascular disease: In April 2019, he underwent vascular surgery by Dr. Trula Slade.  He underwent right femoral to AK popliteal bypass with saphenous vein and right femoral endarterectomy.  Stable without claudication.  Continue antiplatelet therapy along with statin and antihypertensives for BP control.  Smoking cessation advised.   3.  Hyperlipidemia: He reports full compliance with statin therapy.  He is on simvastatin.  No recent lipid panel on file.  When he returns for his 59-monthfollow-up, we will obtain a fasting lipid panel at that time.  LDL goal in the setting of CAD and peripheral vascular disease is less than 70 mg/dL.  4.  Insulin-dependent diabetes: He reports full compliance with insulin regimen.  This is managed by his PCP.  5.  Hypertension: Patient reports full compliance with beta-blocker, ACE inhibitor and long-acting nitrate.  Unfortunately he does not have a home blood pressure cuff at home, thus we do not have a BP reading on him today.  He denies any chest pain, dyspnea, no frequent headaches, dizziness, lightheadedness, syncope/near syncope.  6.   Tobacco abuse: Patient continues to smoke cigarettes.  He reports 1-2 a day.  The importance of full smoking cessation was discussed and encouraged.    COVID-19 Education: The signs and symptoms of COVID-19 were discussed with the patient and how to seek care for testing (follow up with PCP or arrange E-visit).  The importance of social distancing was discussed today.  Time:   Today, I have spent 15 minutes with the patient with telehealth technology discussing the above problems.     Medication Adjustments/Labs and Tests Ordered: Current medicines are reviewed at length with the patient today.  Concerns regarding medicines are outlined above.  Tests Ordered: No orders of the defined types were placed in this encounter.  Medication Changes: No orders of the defined types were placed in this encounter.   Disposition:  Follow up in 3 months with Dr. MAngelena Form Signed, BLyda Jester PA-C  03/10/2019 9:16 AM    [place]

## 2019-03-24 ENCOUNTER — Encounter (HOSPITAL_COMMUNITY): Payer: Medicaid Other

## 2019-03-24 ENCOUNTER — Ambulatory Visit: Payer: Medicaid Other | Admitting: Family

## 2019-03-24 ENCOUNTER — Telehealth: Payer: Self-pay | Admitting: Cardiovascular Disease

## 2019-03-24 NOTE — Telephone Encounter (Signed)
° ° ° °*  STAT* If patient is at the pharmacy, call can be transferred to refill team.   1. Which medications need to be refilled? (please list name of each medication and dose if known) cyclobenzaprine (FLEXERIL) 10 MG tablet  2. Which pharmacy/location (including street and city if local pharmacy) is medication to be sent to? [facility] - [place], [place] - [address]  3. Do they need a 30 day or 90 day supply? 90

## 2019-03-25 NOTE — Telephone Encounter (Signed)
Called pt and left message informing pt that he would have to contact his PCP to request a refill on Flexeril 10 mg tablet, because his PCP has been refilling this medication and also prescribed it as well and if he has any other problems, questions or concerns to call our office.

## 2019-05-20 ENCOUNTER — Telehealth: Payer: Self-pay | Admitting: Cardiovascular Disease

## 2019-05-20 NOTE — Telephone Encounter (Signed)
Thanks

## 2019-05-20 NOTE — Telephone Encounter (Signed)
    COVID-19 Pre-Screening Questions:  . In the past 7 to 10 days have you had a cough,  shortness of breath, headache, congestion, fever (100 or greater) body aches, chills, sore throat, or sudden loss of taste or sense of smell? no . Have you been around anyone with known Covid 19. no . Have you been around anyone who is awaiting Covid 19 test results in the past 7 to 10 days? no . Have you been around anyone who has been exposed to Covid 19, or has mentioned symptoms of Covid 19 within the past 7 to 10 days? no  If you have any concerns/questions about symptoms patients report during screening (either on the phone or at threshold). Contact the provider seeing the patient or DOD for further guidance.  If neither are available contact a member of the leadership team.   Called patient about his message. Patient complaining of having right arm pain that is relieved with nirto and was the same symptom he had with his heart attack. Patient stated the last time he had right arm pain was yesterday and today he has not had an episode. Informed patient if this happens again and nitro does not relieve his pain to go to the ED. Patient stated he has been trying to avoid the ED or even calling us, but he is concerned. Made patient an appointment tomorrow with Ellen Henri PA, so he can be evaluated and get an EKG. Informed patient to make sure he wears a mask and have no visitors with him at his appointment. Patient verbalized understanding and agreed to plan.

## 2019-05-20 NOTE — Telephone Encounter (Signed)
  Patient is c/o right arm pain that has become more frequent. He states he takes a couple of nitro pills and the pain will stop. His arm hurt when he had his heart attack so he knows it is coming from his heart. He states it is not hurting today but he was told to call for an appointment if it kept happening. He would like to come into the office to be seen.

## 2019-05-21 ENCOUNTER — Other Ambulatory Visit: Payer: Self-pay

## 2019-05-21 ENCOUNTER — Ambulatory Visit (INDEPENDENT_AMBULATORY_CARE_PROVIDER_SITE_OTHER): Payer: Medicaid Other | Admitting: Cardiology

## 2019-05-21 ENCOUNTER — Encounter: Payer: Self-pay | Admitting: Cardiology

## 2019-05-21 VITALS — BP 130/82 | HR 92 | Ht 69.0 in | Wt 214.1 lb

## 2019-05-21 DIAGNOSIS — Z01810 Encounter for preprocedural cardiovascular examination: Secondary | ICD-10-CM

## 2019-05-21 DIAGNOSIS — I25110 Atherosclerotic heart disease of native coronary artery with unstable angina pectoris: Secondary | ICD-10-CM

## 2019-05-21 MED ORDER — CARVEDILOL 6.25 MG PO TABS: 12.5000 mg | ORAL_TABLET | Freq: Two times a day (BID) | ORAL | 3 refills | Status: DC

## 2019-05-21 MED ORDER — NITROGLYCERIN 0.4 MG SL SUBL: 0.4000 mg | SUBLINGUAL_TABLET | SUBLINGUAL | 3 refills | Status: DC | PRN

## 2019-05-21 MED ORDER — CYCLOBENZAPRINE HCL 10 MG PO TABS: 10.0000 mg | ORAL_TABLET | Freq: Two times a day (BID) | ORAL | 0 refills | Status: DC | PRN

## 2019-05-21 NOTE — Patient Instructions (Signed)
Medication Instructions:  INCREASE CARVEDILOL to 12.5 mg TWICE per DAY Nitroglycerin 0.4 mg AS NEEDED for Chest Pain every 5 minutes, up to 3 PILLS   If you need a refill on your cardiac medications before your next appointment, please call your pharmacy.   Lab work:TODAY CBC BMET If you have labs (blood work) drawn today and your tests are completely normal, you will receive your results only by: Marland Kitchen MyChart Message (if you have MyChart) OR . A paper copy in the mail If you have any lab test that is abnormal or we need to change your treatment, we will call you to review the results.  Testing/Procedures: Your physician has requested that you have a cardiac catheterization. Cardiac catheterization is used to diagnose and/or treat various heart conditions. Doctors may recommend this procedure for a number of different reasons. The most common reason is to evaluate chest pain. Chest pain can be a symptom of coronary artery disease (CAD), and cardiac catheterization can show whether plaque is narrowing or blocking your heart's arteries. This procedure is also used to evaluate the valves, as well as measure the blood flow and oxygen levels in different parts of your heart. For further information please visit [email or web address]. Please follow instruction sheet, as given.     [place] OFFICE [place], [place] [address] Dept: [phone] Loc: [phone]  Ryan [name]  05/21/2019  You are scheduled for a Cardiac Catheterization on Friday, June 19 with Dr. [name].  1. Please arrive at the [facility] (Main Entrance A) at [facility]: [address] [place], [place] [address] at 9:00 AM (This time is three hours before your procedure to ensure your preparation). Free valet parking service is available.   Special note: Every effort is made to have your procedure done on  time. Please understand that emergencies sometimes delay scheduled procedures.  2. Diet: Do not eat solid foods after midnight.  The patient may have clear liquids until 5am upon the day of the procedure.  3. Labs:DONE ON 6/17 4. Medication instructions in preparation for your procedure:     TAKE ALL MEDICATIONS EXCEPT:       HOLD:  Vasotec (enalapril) MORNING OF PROCEDURE (Friday)                     INSULIN MORNING OF PROCEDURE    On the morning of your procedure, take your Aspirin 81 mg and any morning medicines NOT listed above.  You may use sips of water.  5. Plan for one night stay--bring personal belongings. 6. Bring a current list of your medications and current insurance cards. 7. You MUST have a responsible person to drive you home. 8. Someone MUST be with you the first 24 hours after you arrive home or your discharge will be delayed. 9. Please wear clothes that are easy to get on and off and wear slip-on shoes.  Thank you for allowing [place] to care for you!   -- [place] [facility]   Follow-Up: 2 weeks post CATH 6/19 Dr Angelena Form or Lyda Jester, [place], you and your health needs are our priority.  As part of our continuing mission to provide you with exceptional heart care, we have created designated Provider Care Teams.  These Care Teams include your primary Cardiologist (physician) and Advanced Practice Providers (APPs -  Physician Assistants and Nurse Practitioners) who all  work together to provide you with the care you need, when you need it. .   Any Other Special Instructions Will Be Listed Below (If Applicable).  If PAIN GETS WORSE BEFORE Friday, GO TO THE Emergency Department

## 2019-05-21 NOTE — Progress Notes (Signed)
05/21/2019 [name]   09/13/1966  [id number]  Primary Physician Nolene Ebbs, MD Primary Cardiologist: [name], MD  Electrophysiologist: None   Reason for Visit/CC: Rt Arm Pain (anginal equivalent)  HPI:  Ryan [name] is a 53 y.o. male who is being seen today for the evaluation of right arm pain, which has historically been his anginal equivalent.  He has a long history of CAD, peripheral vascular disease, poorly controlled insulin-dependent diabetes mellitus, tobacco abuse, hypertension and hyperlipidemia.  He has been followed here by Dr. Angelena Form.He had an anterior STEMI in January 2013 with cardiac arrest and found to have an occluded mid LAD treated with a drug eluting stent. The third OM branch was chronically occluded and the RCA had mild disease. His post MI LVEF was 35% but this improved and last echo in March 2013 with normal LV systolic function. Cardiac cath in November 2017 with severe stent restenosis in the LAD, treated with a cutting balloon angioplasty.  On July 31, 2017, he had ananterior wall STEMI which was treated at [facility]).Per records, he presented with complaint of chest pain with radiation to right arm, c/w previous MI pain. EKG showed STEMI anterior wall. Code STEMI was activated.He was take to the cath lab. Procedure was performed by Dr. Wyline Copas at [facility]. He was found to have acute occlusion of the prox LAD, just proximal to old stents. This was sucessfully treated with PCI/ Xience Drug Eluting stent. He also underwent POBA to the ostial diag thru stent strut. EF was 40%. Troponin peaked at 10. He was placed on DAPT w/ ASA + Brilinta(Previouslyon Plavix, discontinued).  He has also required surgical revascularization for peripheral vascular disease.  InApril2019,he underwent vascular surgery by Dr. Trula Slade. He underwent right femoral to AK popliteal bypass with saphenous vein and right femoral  endarterectomy.   He contacted the office yesterday given concerns of recurrent right arm pain which has been his anginal equivalent in the past.  This has required increasing use of sublingual nitroglycerin. Episodes have become more frequent and severe and now requires more SL NTG, 2 instead of 1 to resolve. He is currently pain free. No anterior chest or arm pain. EKG shows NSR w/ old anterior infarct but no acute ST changes. 92 bpm. BP is 130/82. He reports full med compliance. Continues to smoke 2-3 cigs a day. Denies LE claudication.    Cardiac Studies   Cardiac catheterization at [facility] 07/31/2017  Electronically signed by Rozann Lesches, MD(Interventional Physician)  on 07/31/2017 16:30  Angiographic Findings  Cardiac Arteries and Lesion Findings LMCA: 0%. LAD: Lesion on Prox LAD: Proximal subsection.100% stenosis 10 mm length reduced to 0%. Pre procedure TIMI 0 flow was noted. Post Procedure TIMI III flow was present. Poor run off was present. The lesion was diagnosed as High Risk (C). Devices used - Abbott 0.014" x 190cm BMW J-Tip PTCI Guidewire. Number of passes: 1. - 2.5x12 Euphora. 5 inflation(s) to a max pressure of: 14 atm. - 3.5 mm x 12 mm Xience Sierra DES Stent. 1 inflation(s) to a max pressure of: 12 atm. Lesion on 1st Diag: Ostial.80% stenosis 4 mm length reduced to 0%. Pre procedure TIMI 0 flow was noted. Post Procedure TIMI III flow was present. Poor run off was present. The lesion was diagnosed as High Risk (C). Bifurcation lesion. Devices used - Abbott 0.014" x 190cm BMW J-Tip PTCI Guidewire. Number of passes: 1. - 2.5x12 Euphora. 2 inflation(s) to a max pressure  of: 6 atm. LCx: 0%. RCA: 0%.   Current Meds  Medication Sig  . aspirin EC 81 MG tablet Take 1 tablet (81 mg total) by mouth daily.  . cyclobenzaprine (FLEXERIL) 10 MG tablet Take 1 tablet (10 mg total) by mouth 2 (two) times daily as needed for muscle  spasms.  . enalapril (VASOTEC) 5 MG tablet TAKE ONE TABLET BY MOUTH TWICE DAILY  . Insulin Glargine (LANTUS SOLOSTAR) 100 UNIT/ML Solostar Pen Inject 75 Units into the skin at bedtime.   . isosorbide mononitrate (IMDUR) 30 MG 24 hr tablet TAKE 1 TABLET BY MOUTH EVERY DAY  . oxyCODONE-acetaminophen (PERCOCET) 10-325 MG tablet Take 1 tablet by mouth every 4 (four) hours as needed for pain.  . pantoprazole (PROTONIX) 40 MG tablet Take 40 mg by mouth daily.  . simvastatin (ZOCOR) 80 MG tablet Take 80 mg by mouth at bedtime.  . ticagrelor (BRILINTA) 60 MG TABS tablet Take 1 tablet (60 mg total) by mouth 2 (two) times daily.  . traZODone (DESYREL) 100 MG tablet Take 100 mg by mouth at bedtime as needed for sleep.  . [DISCONTINUED] carvedilol (COREG) 6.25 MG tablet Take 1 tablet (6.25 mg total) by mouth 2 (two) times daily.  . [DISCONTINUED] cyclobenzaprine (FLEXERIL) 10 MG tablet Take 10 mg by mouth 2 (two) times daily as needed for muscle spasms.    Allergies  Allergen Reactions  . Hydrocodone Hives and Nausea And Vomiting   Past Medical History:  Diagnosis Date  . Acute myocardial infarction of other anterior wall, subsequent episode of care 12/12/2011  . Anal fissure   . Atherosclerosis of native artery of both lower extremities with intermittent claudication ([place]) 04/01/2018  . Bruises easily    d/t being on Effient  . CAD (coronary artery disease)    A. Acute Ant STEMI 12/12/2011   . Cardiomyopathy secondary 12/26/2011  . Chronic total occlusion of artery of the extremities ([place]) 02/26/2012  . Claudication ([place]) 12/26/2011  . Coronary atherosclerosis of native coronary artery    a. ant STEMI with cardiac arrest 2013 s/p DES to mLAD. b. [place] 10/2016 s/p PTCA to mLAD.  . Diabetes mellitus without complication ([place])   . Essential hypertension 10/23/2016  . GERD (gastroesophageal reflux disease)    "takes tums"  . History of blood transfusion    no abnormal reaction noted  . History of kidney  stones   . Hypertension   . Ischemic cardiomyopathy    a. EF 35% in 02/2012 at time of acute MI, improved to normal on subsequent imaging.  . MI (myocardial infarction) ([place])    AMI 0/16 - complicated by VT/[place]  . Mixed hyperlipidemia   . Numbness and tingling of right arm 03/31/2013  . Occlusion and stenosis of carotid artery without mention of cerebral infarction 08/26/2012  . PAD (peripheral artery disease) ([place])    followed by Vascular - aortobifem bypass 01/2013 & left carotid-subclavian artery bypass 04/2013  . Peripheral vascular disease, unspecified ([place]) 12/16/2012  . PVD (peripheral vascular disease) ([place]) 03/31/2013  . Rectal polyp   . Subclavian steal syndrome 08/11/2013  . Uncontrolled diabetes mellitus ([place]) 10/23/2016   Family History  Problem Relation Age of Onset  . Cancer Father        Lung  . Diabetes Mother   . COPD Mother   . Cancer Maternal Uncle        Colon   Past Surgical History:  Procedure Laterality Date  . ABDOMINAL AORTOGRAM W/LOWER EXTREMITY N/A 06/26/2017  Procedure: Abdominal Aortogram w/Lower Extremity;  Surgeon: Serafina Mitchell, MD;  Location: [place] [facility];  Service: Cardiovascular;  Laterality: N/A;  . ANAL FISSURECTOMY    . AORTA - BILATERAL FEMORAL ARTERY BYPASS GRAFT N/A 01/16/2013   Procedure: AORTA BIFEMORAL BYPASS GRAFT;  Surgeon: Serafina Mitchell, MD;  Location: [place];  Service: Vascular;  Laterality: N/A;  . APPENDECTOMY    . CARDIAC CATHETERIZATION N/A 10/23/2016   Procedure: Left Heart Cath and Coronary Angiography;  Surgeon: Burnell Blanks, MD;  Location: [place] [facility];  Service: Cardiovascular;  Laterality: N/A;  . CARDIAC CATHETERIZATION N/A 10/23/2016   Procedure: Coronary Balloon Angioplasty;  Surgeon: Burnell Blanks, MD;  Location: [place] [facility];  Service: Cardiovascular;  Laterality: N/A;  . CAROTID-SUBCLAVIAN BYPASS GRAFT Left 04/03/2013   Procedure: BYPASS GRAFT CAROTID-SUBCLAVIAN;  Surgeon: Serafina Mitchell, MD;  Location: [place];  Service: Vascular;  Laterality: Left;  . CIRCUMCISION N/A 12/15/2015   Procedure: CIRCUMCISION ADULT;  Surgeon: Cleon Gustin, MD;  Location: [facility];  Service: Urology;  Laterality: N/A;  . COLONOSCOPY    . CORONARY ANGIOPLASTY     stent placed Dec 12, 2011 and 2018  . DIAGNOSTIC LAPAROSCOPY    . ENDARTERECTOMY FEMORAL Right 03/13/2018   Procedure: FEMORAL ENDARTERECTOMY RIGHT;  Surgeon: Serafina Mitchell, MD;  Location: [place];  Service: Vascular;  Laterality: Right;  . FEMORAL-POPLITEAL BYPASS GRAFT Right 03/13/2018   Procedure: FEMORAL-POPLITEAL ARTERY BYPASS WITH NON-REVERSE VEIN RIGHT;  Surgeon: Serafina Mitchell, MD;  Location: [place];  Service: Vascular;  Laterality: Right;  . GROIN DISSECTION Right 03/13/2018   Procedure: RE-DO COMMON FEMORAL ARTERY EXPOSURE;  Surgeon: Serafina Mitchell, MD;  Location: [facility] OR;  Service: Vascular;  Laterality: Right;  . I&D EXTREMITY Left 04/18/2013   Procedure: debridement of left neck lymphocele;  Surgeon: Serafina Mitchell, MD;  Location: [facility] OR;  Service: Vascular;  Laterality: Left;  I and D of left neck  . I&D EXTREMITY Right 11/21/2014   Procedure: IRRIGATION AND DEBRIDEMENT RIGHT HAND;  Surgeon: Roseanne Kaufman, MD;  Location: [place];  Service: Orthopedics;  Laterality: Right;  . LEFT HEART CATHETERIZATION WITH CORONARY ANGIOGRAM N/A 12/12/2011   Procedure: LEFT HEART CATHETERIZATION WITH CORONARY ANGIOGRAM;  Surgeon: Burnell Blanks, MD;  Location: [facility] CATH LAB;  Service: Cardiovascular;  Laterality: N/A;  . LOWER EXTREMITY ANGIOGRAM N/A 01/31/2012   Procedure: LOWER EXTREMITY ANGIOGRAM;  Surgeon: Burnell Blanks, MD;  Location: [facility];  Service: Cardiovascular;  Laterality: N/A;  . LOWER EXTREMITY ANGIOGRAPHY N/A 04/23/2018   Procedure: LOWER EXTREMITY ANGIOGRAPHY;  Surgeon: Serafina Mitchell, MD;  Location: [place] [facility];  Service: Cardiovascular;  Laterality: N/A;  . PERCUTANEOUS CORONARY STENT  INTERVENTION (PCI-S) Right 12/12/2011   Procedure: PERCUTANEOUS CORONARY STENT INTERVENTION (PCI-S);  Surgeon: Burnell Blanks, MD;  Location: [facility];  Service: Cardiovascular;  Laterality: Right;  . PERIPHERAL VASCULAR INTERVENTION Left 04/23/2018   Procedure: PERIPHERAL VASCULAR INTERVENTION;  Surgeon: Serafina Mitchell, MD;  Location: [place] [facility];  Service: Cardiovascular;  Laterality: Left;  superficial femoral  . REPAIR EXTENSOR TENDON Right 11/21/2014   Procedure: WITH REPAIR/RECONSTRUCTION OF EXTENSOR TENDONS AS NEEDED;  Surgeon: Roseanne Kaufman, MD;  Location: [place];  Service: Orthopedics;  Laterality: Right;   Social History   Socioeconomic History  . Marital status: Divorced    Spouse name: Not on file  . Number of children: Not on file  . Years  of education: Not on file  . Highest education level: Not on file  Occupational History    Employer: OTHER  Social Needs  . Financial resource strain: Not on file  . Food insecurity    Worry: Not on file    Inability: Not on file  . Transportation needs    Medical: Not on file    Non-medical: Not on file  Tobacco Use  . Smoking status: Current Every Day Smoker    Packs/day: 0.25    Years: 25.00    Pack years: 6.25    Types: Cigarettes  . Smokeless tobacco: Never Used  . Tobacco comment: 1-2 cigs a day  Substance and Sexual Activity  . Alcohol use: No  . Drug use: No  . Sexual activity: Yes  Lifestyle  . Physical activity    Days per week: Not on file    Minutes per session: Not on file  . Stress: Not on file  Relationships  . Social Herbalist on phone: Not on file    Gets together: Not on file    Attends religious service: Not on file    Active member of club or organization: Not on file    Attends meetings of clubs or organizations: Not on file    Relationship status: Not on file  . Intimate partner violence    Fear of current or ex partner: Not on file    Emotionally abused: Not on file     Physically abused: Not on file    Forced sexual activity: Not on file  Other Topics Concern  . Not on file  Social History Narrative   Disabled - no longer works     Lipid Panel     Component Value Date/Time   CHOL 180 11/09/2017 1050   TRIG 299 (H) 11/09/2017 1050   HDL 31 (L) 11/09/2017 1050   CHOLHDL 5.8 (H) 11/09/2017 1050   CHOLHDL 7.9 12/13/2011 0607   VLDL 66 (H) 12/13/2011 0607   LDLCALC 89 11/09/2017 1050    Review of Systems: General: negative for chills, fever, night sweats or weight changes.  Cardiovascular: negative for chest pain, dyspnea on exertion, edema, orthopnea, palpitations, paroxysmal nocturnal dyspnea or shortness of breath Dermatological: negative for rash Respiratory: negative for cough or wheezing Urologic: negative for hematuria Abdominal: negative for nausea, vomiting, diarrhea, bright red blood per rectum, melena, or hematemesis Neurologic: negative for visual changes, syncope, or dizziness All other systems reviewed and are otherwise negative except as noted above.   Physical Exam:  Blood pressure 130/82, pulse 92, height 5' 9"  (1.753 m), weight 214 lb 1.9 oz (97.1 kg), SpO2 98 %.  General appearance: alert, cooperative and no distress Neck: no carotid bruit and no JVD Lungs: clear to auscultation bilaterally Heart: regular rate and rhythm, S1, S2 normal, no murmur, click, rub or gallop Extremities: extremities normal, atraumatic, no cyanosis or edema Pulses: 2+ and symmetric Skin: Skin color, texture, turgor normal. No rashes or lesions Neurologic: Grossly normal  EKG NSR 92 bpm, old anterior infarct, no acute ST changes -- personally reviewed   ASSESSMENT AND PLAN:   1. Known CAD w/ Unstable Angina: History of myocardial infarction x2 with coronary stenting as outlined above in HPI. He has been having recurrent symptoms c/w prior angina w/ more frequent attacks and use of SL NTG. Currently pain free in clinic and EKG w/o acute ST  changes. Given his symptoms and other cardiac risk factors, including ongoing tobacco abuse,  poorly controlled DM, HTN and HLD, I recommend repeat cardiac cath w/ Dr. Angelena Form this coming Friday. In the meantime, we will further titrate his antianginals. His BP is 130/82 and resting pulse rate is 92 bpm. We will increase Coreg to 12.5 mg BID. Continue on Imdur, ASA, Brilinta and statin. Will order SL NTG.  Pt instructed to seek emergency medical attention if worsening symptoms, unrelieved w/ SL NTG prior to cath. Check pre cath labs today, CBC and BMP. Hold ACEi and insulin morning of cath. COVID screening prior to cath. I have reviewed the risks, indications, and alternatives to cardiac catheterization and possible angioplasty/stenting with the patient. Risks include but are not limited to bleeding, infection, vascular injury, stroke, myocardial infection, arrhythmia, kidney injury, radiation-related injury in the case of prolonged fluoroscopy use, emergency cardiac surgery, and death. The patient understands the risks of serious complication is low (<7%).    2.  Peripheral vascular disease: InApril2019,he underwent vascular surgery by Dr. Trula Slade. He underwent right femoral to AK popliteal bypass with saphenous vein and right femoral endarterectomy.  Stable without claudication.  Continue antiplatelet therapy along with statin and antihypertensives for BP control.  Smoking cessation advised.   3.  Hyperlipidemia: He reports full compliance with statin therapy.  He is on simvastatin.  No recent lipid panel on file.  Will order FLP and HFTs.  LDL goal in the setting of CAD and peripheral vascular disease is less than 70 mg/dL. If not at goal on updated labs, will change to atorvastatin or rosuvastatin.   4.  Insulin-dependent diabetes: He reports full compliance with insulin regimen.  This is managed by his PCP.  5.  Hypertension: Patient reports full compliance with beta-blocker, ACE inhibitor and  long-acting nitrate.  Controlled but will further titrate Coreg due to angina to 12.5 mg BID.   6.  Tobacco abuse: Patient continues to smoke cigarettes.  He reports 1-2 a day.  The importance of full smoking cessation was discussed and encouraged.   Follow-Up in 2 weeks, post cardiac cath.   Lilygrace Rodick Ladoris Gene, MHS [facility] 05/21/2019 4:23 PM

## 2019-05-21 NOTE — H&P (View-Only) (Signed)
05/21/2019 Ryan [name]   01/05/1966  [id number]  Primary Physician Nolene Ebbs, MD Primary Cardiologist: [name], MD  Electrophysiologist: None   Reason for Visit/CC: Rt Arm Pain (anginal equivalent)  HPI:  Ryan [name] is a 53 y.o. male who is being seen today for the evaluation of right arm pain, which has historically been his anginal equivalent.  He has a long history of CAD, peripheral vascular disease, poorly controlled insulin-dependent diabetes mellitus, tobacco abuse, hypertension and hyperlipidemia.  He has been followed here by Dr. Angelena Form.He had an anterior STEMI in January 2013 with cardiac arrest and found to have an occluded mid LAD treated with a drug eluting stent. The third OM branch was chronically occluded and the RCA had mild disease. His post MI LVEF was 35% but this improved and last echo in March 2013 with normal LV systolic function. Cardiac cath in November 2017 with severe stent restenosis in the LAD, treated with a cutting balloon angioplasty.  On July 31, 2017, he had ananterior wall STEMI which was treated at [facility]).Per records, he presented with complaint of chest pain with radiation to right arm, c/w previous MI pain. EKG showed STEMI anterior wall. Code STEMI was activated.He was take to the cath lab. Procedure was performed by Dr. Wyline Copas at [facility]. He was found to have acute occlusion of the prox LAD, just proximal to old stents. This was sucessfully treated with PCI/ Xience Drug Eluting stent. He also underwent POBA to the ostial diag thru stent strut. EF was 40%. Troponin peaked at 10. He was placed on DAPT w/ ASA + Brilinta(Previouslyon Plavix, discontinued).  He has also required surgical revascularization for peripheral vascular disease.  InApril2019,he underwent vascular surgery by Dr. Trula Slade. He underwent right femoral to AK popliteal bypass with saphenous vein and right femoral  endarterectomy.   He contacted the office yesterday given concerns of recurrent right arm pain which has been his anginal equivalent in the past.  This has required increasing use of sublingual nitroglycerin. Episodes have become more frequent and severe and now requires more SL NTG, 2 instead of 1 to resolve. He is currently pain free. No anterior chest or arm pain. EKG shows NSR w/ old anterior infarct but no acute ST changes. 92 bpm. BP is 130/82. He reports full med compliance. Continues to smoke 2-3 cigs a day. Denies LE claudication.    Cardiac Studies   Cardiac catheterization at [facility] 07/31/2017  Electronically signed by Rozann Lesches, MD(Interventional Physician)  on 07/31/2017 16:30  Angiographic Findings  Cardiac Arteries and Lesion Findings LMCA: 0%. LAD: Lesion on Prox LAD: Proximal subsection.100% stenosis 10 mm length reduced to 0%. Pre procedure TIMI 0 flow was noted. Post Procedure TIMI III flow was present. Poor run off was present. The lesion was diagnosed as High Risk (C). Devices used - Abbott 0.014" x 190cm BMW J-Tip PTCI Guidewire. Number of passes: 1. - 2.5x12 Euphora. 5 inflation(s) to a max pressure of: 14 atm. - 3.5 mm x 12 mm Xience Sierra DES Stent. 1 inflation(s) to a max pressure of: 12 atm. Lesion on 1st Diag: Ostial.80% stenosis 4 mm length reduced to 0%. Pre procedure TIMI 0 flow was noted. Post Procedure TIMI III flow was present. Poor run off was present. The lesion was diagnosed as High Risk (C). Bifurcation lesion. Devices used - Abbott 0.014" x 190cm BMW J-Tip PTCI Guidewire. Number of passes: 1. - 2.5x12 Euphora. 2 inflation(s) to a max pressure  of: 6 atm. LCx: 0%. RCA: 0%.   Current Meds  Medication Sig  . aspirin EC 81 MG tablet Take 1 tablet (81 mg total) by mouth daily.  . cyclobenzaprine (FLEXERIL) 10 MG tablet Take 1 tablet (10 mg total) by mouth 2 (two) times daily as needed for muscle  spasms.  . enalapril (VASOTEC) 5 MG tablet TAKE ONE TABLET BY MOUTH TWICE DAILY  . Insulin Glargine (LANTUS SOLOSTAR) 100 UNIT/ML Solostar Pen Inject 75 Units into the skin at bedtime.   . isosorbide mononitrate (IMDUR) 30 MG 24 hr tablet TAKE 1 TABLET BY MOUTH EVERY DAY  . oxyCODONE-acetaminophen (PERCOCET) 10-325 MG tablet Take 1 tablet by mouth every 4 (four) hours as needed for pain.  . pantoprazole (PROTONIX) 40 MG tablet Take 40 mg by mouth daily.  . simvastatin (ZOCOR) 80 MG tablet Take 80 mg by mouth at bedtime.  . ticagrelor (BRILINTA) 60 MG TABS tablet Take 1 tablet (60 mg total) by mouth 2 (two) times daily.  . traZODone (DESYREL) 100 MG tablet Take 100 mg by mouth at bedtime as needed for sleep.  . [DISCONTINUED] carvedilol (COREG) 6.25 MG tablet Take 1 tablet (6.25 mg total) by mouth 2 (two) times daily.  . [DISCONTINUED] cyclobenzaprine (FLEXERIL) 10 MG tablet Take 10 mg by mouth 2 (two) times daily as needed for muscle spasms.    Allergies  Allergen Reactions  . Hydrocodone Hives and Nausea And Vomiting   Past Medical History:  Diagnosis Date  . Acute myocardial infarction of other anterior wall, subsequent episode of care 12/12/2011  . Anal fissure   . Atherosclerosis of native artery of both lower extremities with intermittent claudication ([place]) 04/01/2018  . Bruises easily    d/t being on Effient  . CAD (coronary artery disease)    A. Acute Ant STEMI 12/12/2011   . Cardiomyopathy secondary 12/26/2011  . Chronic total occlusion of artery of the extremities ([place]) 02/26/2012  . Claudication ([place]) 12/26/2011  . Coronary atherosclerosis of native coronary artery    a. ant STEMI with cardiac arrest 2013 s/p DES to mLAD. b. [place] 10/2016 s/p PTCA to mLAD.  . Diabetes mellitus without complication ([place])   . Essential hypertension 10/23/2016  . GERD (gastroesophageal reflux disease)    "takes tums"  . History of blood transfusion    no abnormal reaction noted  . History of kidney  stones   . Hypertension   . Ischemic cardiomyopathy    a. EF 35% in 02/2012 at time of acute MI, improved to normal on subsequent imaging.  . MI (myocardial infarction) ([place])    AMI 9/67 - complicated by VT/Tosades  . Mixed hyperlipidemia   . Numbness and tingling of right arm 03/31/2013  . Occlusion and stenosis of carotid artery without mention of cerebral infarction 08/26/2012  . PAD (peripheral artery disease) ([place])    followed by Vascular - aortobifem bypass 01/2013 & left carotid-subclavian artery bypass 04/2013  . Peripheral vascular disease, unspecified ([place]) 12/16/2012  . PVD (peripheral vascular disease) ([place]) 03/31/2013  . Rectal polyp   . Subclavian steal syndrome 08/11/2013  . Uncontrolled diabetes mellitus ([place]) 10/23/2016   Family History  Problem Relation Age of Onset  . Cancer Father        Lung  . Diabetes Mother   . COPD Mother   . Cancer Maternal Uncle        Colon   Past Surgical History:  Procedure Laterality Date  . ABDOMINAL AORTOGRAM W/LOWER EXTREMITY N/A 06/26/2017  Procedure: Abdominal Aortogram w/Lower Extremity;  Surgeon: Serafina Mitchell, MD;  Location: [place] [facility];  Service: Cardiovascular;  Laterality: N/A;  . ANAL FISSURECTOMY    . AORTA - BILATERAL FEMORAL ARTERY BYPASS GRAFT N/A 01/16/2013   Procedure: AORTA BIFEMORAL BYPASS GRAFT;  Surgeon: Serafina Mitchell, MD;  Location: [place];  Service: Vascular;  Laterality: N/A;  . APPENDECTOMY    . CARDIAC CATHETERIZATION N/A 10/23/2016   Procedure: Left Heart Cath and Coronary Angiography;  Surgeon: Burnell Blanks, MD;  Location: [place] [facility];  Service: Cardiovascular;  Laterality: N/A;  . CARDIAC CATHETERIZATION N/A 10/23/2016   Procedure: Coronary Balloon Angioplasty;  Surgeon: Burnell Blanks, MD;  Location: [place] [facility];  Service: Cardiovascular;  Laterality: N/A;  . CAROTID-SUBCLAVIAN BYPASS GRAFT Left 04/03/2013   Procedure: BYPASS GRAFT CAROTID-SUBCLAVIAN;  Surgeon: Serafina Mitchell, MD;  Location: [place];  Service: Vascular;  Laterality: Left;  . CIRCUMCISION N/A 12/15/2015   Procedure: CIRCUMCISION ADULT;  Surgeon: Cleon Gustin, MD;  Location: [facility];  Service: Urology;  Laterality: N/A;  . COLONOSCOPY    . CORONARY ANGIOPLASTY     stent placed Dec 12, 2011 and 2018  . DIAGNOSTIC LAPAROSCOPY    . ENDARTERECTOMY FEMORAL Right 03/13/2018   Procedure: FEMORAL ENDARTERECTOMY RIGHT;  Surgeon: Serafina Mitchell, MD;  Location: [place];  Service: Vascular;  Laterality: Right;  . FEMORAL-POPLITEAL BYPASS GRAFT Right 03/13/2018   Procedure: FEMORAL-POPLITEAL ARTERY BYPASS WITH NON-REVERSE VEIN RIGHT;  Surgeon: Serafina Mitchell, MD;  Location: [place];  Service: Vascular;  Laterality: Right;  . GROIN DISSECTION Right 03/13/2018   Procedure: RE-DO COMMON FEMORAL ARTERY EXPOSURE;  Surgeon: Serafina Mitchell, MD;  Location[facility];  Service: Vascular;  Laterality: Right;  . I&D EXTREMITY Left 04/18/2013   Procedure: debridement of left neck lymphocele;  Surgeon: Serafina Mitchell, MD;  Location: [facility] OR;  Service: Vascular;  Laterality: Left;  I and D of left neck  . I&D EXTREMITY Right 11/21/2014   Procedure: IRRIGATION AND DEBRIDEMENT RIGHT HAND;  Surgeon: Roseanne Kaufman, MD;  Location: [place];  Service: Orthopedics;  Laterality: Right;  . LEFT HEART CATHETERIZATION WITH CORONARY ANGIOGRAM N/A 12/12/2011   Procedure: LEFT HEART CATHETERIZATION WITH CORONARY ANGIOGRAM;  Surgeon: Burnell Blanks, MD;  Location: [facility] CATH LAB;  Service: Cardiovascular;  Laterality: N/A;  . LOWER EXTREMITY ANGIOGRAM N/A 01/31/2012   Procedure: LOWER EXTREMITY ANGIOGRAM;  Surgeon: Burnell Blanks, MD;  Location: [facility] CATH LAB;  Service: Cardiovascular;  Laterality: N/A;  . LOWER EXTREMITY ANGIOGRAPHY N/A 04/23/2018   Procedure: LOWER EXTREMITY ANGIOGRAPHY;  Surgeon: Serafina Mitchell, MD;  Location: [place] [facility];  Service: Cardiovascular;  Laterality: N/A;  . PERCUTANEOUS CORONARY STENT  INTERVENTION (PCI-S) Right 12/12/2011   Procedure: PERCUTANEOUS CORONARY STENT INTERVENTION (PCI-S);  Surgeon: Burnell Blanks, MD;  Location: [facility] CATH LAB;  Service: Cardiovascular;  Laterality: Right;  . PERIPHERAL VASCULAR INTERVENTION Left 04/23/2018   Procedure: PERIPHERAL VASCULAR INTERVENTION;  Surgeon: Serafina Mitchell, MD;  Location: [place] [facility];  Service: Cardiovascular;  Laterality: Left;  superficial femoral  . REPAIR EXTENSOR TENDON Right 11/21/2014   Procedure: WITH REPAIR/RECONSTRUCTION OF EXTENSOR TENDONS AS NEEDED;  Surgeon: Roseanne Kaufman, MD;  Location: [place];  Service: Orthopedics;  Laterality: Right;   Social History   Socioeconomic History  . Marital status: Divorced    Spouse name: Not on file  . Number of children: Not on file  . Years  of education: Not on file  . Highest education level: Not on file  Occupational History    Employer: OTHER  Social Needs  . Financial resource strain: Not on file  . Food insecurity    Worry: Not on file    Inability: Not on file  . Transportation needs    Medical: Not on file    Non-medical: Not on file  Tobacco Use  . Smoking status: Current Every Day Smoker    Packs/day: 0.25    Years: 25.00    Pack years: 6.25    Types: Cigarettes  . Smokeless tobacco: Never Used  . Tobacco comment: 1-2 cigs a day  Substance and Sexual Activity  . Alcohol use: No  . Drug use: No  . Sexual activity: Yes  Lifestyle  . Physical activity    Days per week: Not on file    Minutes per session: Not on file  . Stress: Not on file  Relationships  . Social Herbalist on phone: Not on file    Gets together: Not on file    Attends religious service: Not on file    Active member of club or organization: Not on file    Attends meetings of clubs or organizations: Not on file    Relationship status: Not on file  . Intimate partner violence    Fear of current or ex partner: Not on file    Emotionally abused: Not on file     Physically abused: Not on file    Forced sexual activity: Not on file  Other Topics Concern  . Not on file  Social History Narrative   Disabled - no longer works     Lipid Panel     Component Value Date/Time   CHOL 180 11/09/2017 1050   TRIG 299 (H) 11/09/2017 1050   HDL 31 (L) 11/09/2017 1050   CHOLHDL 5.8 (H) 11/09/2017 1050   CHOLHDL 7.9 12/13/2011 0607   VLDL 66 (H) 12/13/2011 0607   LDLCALC 89 11/09/2017 1050    Review of Systems: General: negative for chills, fever, night sweats or weight changes.  Cardiovascular: negative for chest pain, dyspnea on exertion, edema, orthopnea, palpitations, paroxysmal nocturnal dyspnea or shortness of breath Dermatological: negative for rash Respiratory: negative for cough or wheezing Urologic: negative for hematuria Abdominal: negative for nausea, vomiting, diarrhea, bright red blood per rectum, melena, or hematemesis Neurologic: negative for visual changes, syncope, or dizziness All other systems reviewed and are otherwise negative except as noted above.   Physical Exam:  Blood pressure 130/82, pulse 92, height 5' 9"  (1.753 m), weight 214 lb 1.9 oz (97.1 kg), SpO2 98 %.  General appearance: alert, cooperative and no distress Neck: no carotid bruit and no JVD Lungs: clear to auscultation bilaterally Heart: regular rate and rhythm, S1, S2 normal, no murmur, click, rub or gallop Extremities: extremities normal, atraumatic, no cyanosis or edema Pulses: 2+ and symmetric Skin: Skin color, texture, turgor normal. No rashes or lesions Neurologic: Grossly normal  EKG NSR 92 bpm, old anterior infarct, no acute ST changes -- personally reviewed   ASSESSMENT AND PLAN:   1. Known CAD w/ Unstable Angina: History of myocardial infarction x2 with coronary stenting as outlined above in HPI. He has been having recurrent symptoms c/w prior angina w/ more frequent attacks and use of SL NTG. Currently pain free in clinic and EKG w/o acute ST  changes. Given his symptoms and other cardiac risk factors, including ongoing tobacco abuse,  poorly controlled DM, HTN and HLD, I recommend repeat cardiac cath w/ Dr. Angelena Form this coming Friday. In the meantime, we will further titrate his antianginals. His BP is 130/82 and resting pulse rate is 92 bpm. We will increase Coreg to 12.5 mg BID. Continue on Imdur, ASA, Brilinta and statin. Will order SL NTG.  Pt instructed to seek emergency medical attention if worsening symptoms, unrelieved w/ SL NTG prior to cath. Check pre cath labs today, CBC and BMP. Hold ACEi and insulin morning of cath. COVID screening prior to cath. I have reviewed the risks, indications, and alternatives to cardiac catheterization and possible angioplasty/stenting with the patient. Risks include but are not limited to bleeding, infection, vascular injury, stroke, myocardial infection, arrhythmia, kidney injury, radiation-related injury in the case of prolonged fluoroscopy use, emergency cardiac surgery, and death. The patient understands the risks of serious complication is low (<1%).    2.  Peripheral vascular disease: InApril2019,he underwent vascular surgery by Dr. Trula Slade. He underwent right femoral to AK popliteal bypass with saphenous vein and right femoral endarterectomy.  Stable without claudication.  Continue antiplatelet therapy along with statin and antihypertensives for BP control.  Smoking cessation advised.   3.  Hyperlipidemia: He reports full compliance with statin therapy.  He is on simvastatin.  No recent lipid panel on file.  Will order FLP and HFTs.  LDL goal in the setting of CAD and peripheral vascular disease is less than 70 mg/dL. If not at goal on updated labs, will change to atorvastatin or rosuvastatin.   4.  Insulin-dependent diabetes: He reports full compliance with insulin regimen.  This is managed by his PCP.  5.  Hypertension: Patient reports full compliance with beta-blocker, ACE inhibitor and  long-acting nitrate.  Controlled but will further titrate Coreg due to angina to 12.5 mg BID.   6.  Tobacco abuse: Patient continues to smoke cigarettes.  He reports 1-2 a day.  The importance of full smoking cessation was discussed and encouraged.   Follow-Up in 2 weeks, post cardiac cath.    Ladoris Gene, MHS [facility] 05/21/2019 4:23 PM

## 2019-05-22 ENCOUNTER — Other Ambulatory Visit (HOSPITAL_COMMUNITY)
Admission: RE | Admit: 2019-05-22 | Discharge: 2019-05-22 | Disposition: A | Discharge status: Home / Self Care | Payer: Medicaid Other | Source: Ambulatory Visit | Attending: Cardiovascular Disease | Admitting: Cardiovascular Disease

## 2019-05-22 ENCOUNTER — Telehealth: Payer: Self-pay | Admitting: *Deleted

## 2019-05-22 DIAGNOSIS — Z1159 Encounter for screening for other viral diseases: Secondary | ICD-10-CM | POA: Insufficient documentation

## 2019-05-22 LAB — SARS CORONAVIRUS 2 (TAT 6-24 HRS): SARS Coronavirus 2: NEGATIVE

## 2019-05-22 LAB — CBC
Hematocrit: 47.1 % (ref 37.5–51.0)
Hemoglobin: 15.5 g/dL (ref 13.0–17.7)
MCH: 31.7 pg (ref 26.6–33.0)
MCHC: 32.9 g/dL (ref 31.5–35.7)
MCV: 96 fL (ref 79–97)
Platelets: 194 10*3/uL (ref 150–450)
RBC: 4.89 x10E6/uL (ref 4.14–5.80)
RDW: 13.1 % (ref 11.6–15.4)
WBC: 10.6 10*3/uL (ref 3.4–10.8)

## 2019-05-22 LAB — BASIC METABOLIC PANEL
BUN/Creatinine Ratio: 10 (ref 9–20)
BUN: 12 mg/dL (ref 6–24)
CO2: 24 mmol/L (ref 20–29)
Calcium: 8.9 mg/dL (ref 8.7–10.2)
Chloride: 102 mmol/L (ref 96–106)
Creatinine, Ser: 1.16 mg/dL (ref 0.76–1.27)
GFR calc Af Amer: 83 mL/min/{1.73_m2} (ref >59)
GFR calc non Af Amer: 72 mL/min/{1.73_m2} (ref >59)
Glucose: 329 mg/dL — ABNORMAL HIGH (ref 65–99)
Potassium: 4.4 mmol/L (ref 3.5–5.2)
Sodium: 139 mmol/L (ref 134–144)

## 2019-05-22 NOTE — Telephone Encounter (Signed)
Pt contacted pre-catheterization scheduled at [facility] for: Friday May 23, 2019 12 Noon Verified arrival time and place: [facility] Main Entrance A at:10 AM  Covid-19 test date: 05/22/19 [facility]-Cepheid/Jessica at [facility] test site.  Your Pre-procedure COVID-19 Testing will be done on 05/22/19 11:55 AM at [facility] at [address] [place], [place] [address] After your swab you will be given a mask to wear and instructed to go home and quarantine/no visitors until after your procedure. If you test positive you will be notified and your procedure will be cancelled.    No solid food after midnight prior to cath, clear liquids until 5 AM day of procedure. Contrast allergy: no  Hold: Enalapril-AM of procedure Insulin PM prior to procedure-1/2 usual dose.  Except hold medicationsAM meds can be  taken pre-cath with sip of water including: ASA 81 mg Brilinta 60 mg  Confirmed patient has responsible person to drive home post procedure and observe 24 hours after arriving home: yes  Due to Covid-19 pandemic no visitors are allowed in the hospital (unless cognitive impairment).  Their designated party will be called when their procedure is over for an update and to arrange pick up.  Patients are required to wear a mask when they enter the hospital.      COVID-19 Pre-Screening Questions:  . In the past 7 to 10 days have you had a cough,  shortness of breath, headache, congestion, fever (100 or greater) body aches, chills, sore throat, or sudden loss of taste or sense of smell? no . Have you been around anyone with known Covid 19? no . Have you been around anyone who is awaiting Covid 19 test results in the past 7 to 10 days? no . Have you been around anyone who has been exposed to Covid 19, or has mentioned symptoms of Covid 19 within the past 7 to 10 days? no  I reviewed procedure instructions/Covid-19 test instructions/mask/visitor/Covid-19  screening questions with patient, he verbalized understanding.

## 2019-05-23 ENCOUNTER — Other Ambulatory Visit: Payer: Self-pay

## 2019-05-23 ENCOUNTER — Encounter (HOSPITAL_COMMUNITY)
Admission: RE | Disposition: A | Discharge status: Home / Self Care | Payer: Medicaid Other | Source: Home / Self Care | Attending: Cardiovascular Disease

## 2019-05-23 ENCOUNTER — Ambulatory Visit (HOSPITAL_COMMUNITY)
Admission: RE | Admit: 2019-05-23 | Discharge: 2019-05-23 | Disposition: A | Discharge status: Home / Self Care | Payer: Medicaid Other | Attending: Cardiovascular Disease | Admitting: Cardiovascular Disease

## 2019-05-23 DIAGNOSIS — F17210 Nicotine dependence, cigarettes, uncomplicated: Secondary | ICD-10-CM | POA: Insufficient documentation

## 2019-05-23 DIAGNOSIS — E1165 Type 2 diabetes mellitus with hyperglycemia: Secondary | ICD-10-CM | POA: Insufficient documentation

## 2019-05-23 DIAGNOSIS — I10 Essential (primary) hypertension: Secondary | ICD-10-CM | POA: Insufficient documentation

## 2019-05-23 DIAGNOSIS — Z79899 Other long term (current) drug therapy: Secondary | ICD-10-CM | POA: Insufficient documentation

## 2019-05-23 DIAGNOSIS — Z7982 Long term (current) use of aspirin: Secondary | ICD-10-CM | POA: Diagnosis not present

## 2019-05-23 DIAGNOSIS — I200 Unstable angina: Secondary | ICD-10-CM | POA: Diagnosis present

## 2019-05-23 DIAGNOSIS — E785 Hyperlipidemia, unspecified: Secondary | ICD-10-CM | POA: Diagnosis not present

## 2019-05-23 DIAGNOSIS — Z833 Family history of diabetes mellitus: Secondary | ICD-10-CM | POA: Insufficient documentation

## 2019-05-23 DIAGNOSIS — Z794 Long term (current) use of insulin: Secondary | ICD-10-CM | POA: Insufficient documentation

## 2019-05-23 DIAGNOSIS — Y831 Surgical operation with implant of artificial internal device as the cause of abnormal reaction of the patient, or of later complication, without mention of misadventure at the time of the procedure: Secondary | ICD-10-CM | POA: Diagnosis not present

## 2019-05-23 DIAGNOSIS — I25110 Atherosclerotic heart disease of native coronary artery with unstable angina pectoris: Secondary | ICD-10-CM | POA: Diagnosis not present

## 2019-05-23 DIAGNOSIS — Z7902 Long term (current) use of antithrombotics/antiplatelets: Secondary | ICD-10-CM | POA: Diagnosis not present

## 2019-05-23 DIAGNOSIS — I739 Peripheral vascular disease, unspecified: Secondary | ICD-10-CM | POA: Insufficient documentation

## 2019-05-23 DIAGNOSIS — I255 Ischemic cardiomyopathy: Secondary | ICD-10-CM | POA: Insufficient documentation

## 2019-05-23 DIAGNOSIS — Z955 Presence of coronary angioplasty implant and graft: Secondary | ICD-10-CM | POA: Diagnosis not present

## 2019-05-23 DIAGNOSIS — K219 Gastro-esophageal reflux disease without esophagitis: Secondary | ICD-10-CM | POA: Diagnosis not present

## 2019-05-23 DIAGNOSIS — T82858A Stenosis of vascular prosthetic devices, implants and grafts, initial encounter: Secondary | ICD-10-CM | POA: Insufficient documentation

## 2019-05-23 DIAGNOSIS — Z9861 Coronary angioplasty status: Secondary | ICD-10-CM

## 2019-05-23 DIAGNOSIS — Z885 Allergy status to narcotic agent status: Secondary | ICD-10-CM | POA: Diagnosis not present

## 2019-05-23 DIAGNOSIS — I252 Old myocardial infarction: Secondary | ICD-10-CM | POA: Insufficient documentation

## 2019-05-23 HISTORY — PX: CORONARY BALLOON ANGIOPLASTY: CATH118233

## 2019-05-23 HISTORY — PX: LEFT HEART CATH AND CORONARY ANGIOGRAPHY: CATH118249

## 2019-05-23 LAB — GLUCOSE, CAPILLARY
Glucose-Capillary: 372 mg/dL — ABNORMAL HIGH (ref 70–99)
Glucose-Capillary: 291 mg/dL — ABNORMAL HIGH (ref 70–99)

## 2019-05-23 LAB — POCT ACTIVATED CLOTTING TIME
Activated Clotting Time: 263 seconds
Activated Clotting Time: 274 seconds

## 2019-05-23 SURGERY — LEFT HEART CATH AND CORONARY ANGIOGRAPHY
Anesthesia: LOCAL

## 2019-05-23 MED ORDER — SODIUM CHLORIDE 0.9 % WEIGHT BASED INFUSION
3.0000 mL/kg/h | INTRAVENOUS | Status: AC
  Administered 2019-05-23: 3 mL/kg/h via INTRAVENOUS

## 2019-05-23 MED ORDER — ACETAMINOPHEN 325 MG PO TABS: 650.0000 mg | ORAL_TABLET | ORAL | Status: DC | PRN

## 2019-05-23 MED ORDER — SODIUM CHLORIDE 0.9 % IV SOLN: 250.0000 mL | INTRAVENOUS | Status: DC | PRN

## 2019-05-23 MED ORDER — ASPIRIN 81 MG PO CHEW: 81.0000 mg | CHEWABLE_TABLET | ORAL | Status: DC

## 2019-05-23 MED ORDER — INSULIN GLARGINE 100 UNIT/ML SOLOSTAR PEN: 80.0000 [IU] | PEN_INJECTOR | Freq: Every day | SUBCUTANEOUS | Status: DC

## 2019-05-23 MED ORDER — SODIUM CHLORIDE 0.9 % IV SOLN: INTRAVENOUS | Status: AC

## 2019-05-23 MED ORDER — IOHEXOL 350 MG/ML SOLN
INTRAVENOUS | Status: DC | PRN
  Administered 2019-05-23: 100 mL via INTRAVENOUS

## 2019-05-23 MED ORDER — ENALAPRIL MALEATE 5 MG PO TABS: 5.0000 mg | ORAL_TABLET | Freq: Two times a day (BID) | ORAL | Status: DC

## 2019-05-23 MED ORDER — MIDAZOLAM HCL 2 MG/2ML IJ SOLN
INTRAMUSCULAR | Status: AC
  Filled 2019-05-23: qty 2

## 2019-05-23 MED ORDER — NITROGLYCERIN 0.4 MG SL SUBL: 0.4000 mg | SUBLINGUAL_TABLET | SUBLINGUAL | Status: DC | PRN

## 2019-05-23 MED ORDER — FENTANYL CITRATE (PF) 100 MCG/2ML IJ SOLN
INTRAMUSCULAR | Status: AC
  Filled 2019-05-23: qty 2

## 2019-05-23 MED ORDER — VERAPAMIL HCL 2.5 MG/ML IV SOLN
INTRAVENOUS | Status: AC
  Filled 2019-05-23: qty 2

## 2019-05-23 MED ORDER — VERAPAMIL HCL 2.5 MG/ML IV SOLN
INTRAVENOUS | Status: DC | PRN
  Administered 2019-05-23: 10 mL via INTRA_ARTERIAL

## 2019-05-23 MED ORDER — TICAGRELOR 90 MG PO TABS: 90.0000 mg | ORAL_TABLET | Freq: Two times a day (BID) | ORAL | 6 refills | Status: DC

## 2019-05-23 MED ORDER — SODIUM CHLORIDE 0.9% FLUSH: 3.0000 mL | Freq: Two times a day (BID) | INTRAVENOUS | Status: DC

## 2019-05-23 MED ORDER — SODIUM CHLORIDE 0.9% FLUSH: 3.0000 mL | INTRAVENOUS | Status: DC | PRN

## 2019-05-23 MED ORDER — TICAGRELOR 90 MG PO TABS: 90.0000 mg | ORAL_TABLET | Freq: Two times a day (BID) | ORAL | Status: DC

## 2019-05-23 MED ORDER — HYDRALAZINE HCL 20 MG/ML IJ SOLN: 10.0000 mg | INTRAMUSCULAR | Status: DC | PRN

## 2019-05-23 MED ORDER — SODIUM CHLORIDE 0.9 % WEIGHT BASED INFUSION: 1.0000 mL/kg/h | INTRAVENOUS | Status: DC

## 2019-05-23 MED ORDER — LABETALOL HCL 5 MG/ML IV SOLN: 10.0000 mg | INTRAVENOUS | Status: DC | PRN

## 2019-05-23 MED ORDER — ASPIRIN EC 81 MG PO TBEC: 81.0000 mg | DELAYED_RELEASE_TABLET | Freq: Every day | ORAL | Status: DC

## 2019-05-23 MED ORDER — HEPARIN SODIUM (PORCINE) 1000 UNIT/ML IJ SOLN
INTRAMUSCULAR | Status: DC | PRN
  Administered 2019-05-23 (×2): 5000 [IU] via INTRAVENOUS
  Administered 2019-05-23: 2000 [IU] via INTRAVENOUS

## 2019-05-23 MED ORDER — TRAZODONE HCL 100 MG PO TABS: 100.0000 mg | ORAL_TABLET | Freq: Every evening | ORAL | Status: DC | PRN

## 2019-05-23 MED ORDER — OXYCODONE-ACETAMINOPHEN 10-325 MG PO TABS: 1.0000 | ORAL_TABLET | ORAL | Status: DC | PRN

## 2019-05-23 MED ORDER — LIDOCAINE HCL (PF) 1 % IJ SOLN
INTRAMUSCULAR | Status: AC
  Filled 2019-05-23: qty 30

## 2019-05-23 MED ORDER — PANTOPRAZOLE SODIUM 40 MG PO TBEC: 40.0000 mg | DELAYED_RELEASE_TABLET | Freq: Every day | ORAL | Status: DC

## 2019-05-23 MED ORDER — HEPARIN SODIUM (PORCINE) 1000 UNIT/ML IJ SOLN
INTRAMUSCULAR | Status: AC
  Filled 2019-05-23: qty 1

## 2019-05-23 MED ORDER — MIDAZOLAM HCL 2 MG/2ML IJ SOLN
INTRAMUSCULAR | Status: DC | PRN
  Administered 2019-05-23: 2 mg via INTRAVENOUS
  Administered 2019-05-23: 1 mg via INTRAVENOUS

## 2019-05-23 MED ORDER — CARVEDILOL 12.5 MG PO TABS: 12.5000 mg | ORAL_TABLET | Freq: Two times a day (BID) | ORAL | Status: DC

## 2019-05-23 MED ORDER — FENTANYL CITRATE (PF) 100 MCG/2ML IJ SOLN
INTRAMUSCULAR | Status: DC | PRN
  Administered 2019-05-23: 50 ug via INTRAVENOUS
  Administered 2019-05-23: 25 ug via INTRAVENOUS

## 2019-05-23 MED ORDER — ATORVASTATIN CALCIUM 40 MG PO TABS: 40.0000 mg | ORAL_TABLET | Freq: Every day | ORAL | 6 refills | Status: DC

## 2019-05-23 MED ORDER — LIDOCAINE HCL (PF) 1 % IJ SOLN
INTRAMUSCULAR | Status: DC | PRN
  Administered 2019-05-23: 2 mL

## 2019-05-23 MED ORDER — ATORVASTATIN CALCIUM 40 MG PO TABS: 40.0000 mg | ORAL_TABLET | Freq: Every day | ORAL | Status: DC

## 2019-05-23 MED ORDER — ONDANSETRON HCL 4 MG/2ML IJ SOLN: 4.0000 mg | Freq: Four times a day (QID) | INTRAMUSCULAR | Status: DC | PRN

## 2019-05-23 MED ORDER — TICAGRELOR 90 MG PO TABS
ORAL_TABLET | ORAL | Status: DC | PRN
  Administered 2019-05-23: 180 mg via ORAL

## 2019-05-23 MED ORDER — ISOSORBIDE MONONITRATE ER 30 MG PO TB24: 30.0000 mg | ORAL_TABLET | Freq: Every day | ORAL | Status: DC

## 2019-05-23 MED ORDER — HEPARIN (PORCINE) IN NACL 1000-0.9 UT/500ML-% IV SOLN
INTRAVENOUS | Status: DC | PRN
  Administered 2019-05-23 (×2): 500 mL

## 2019-05-23 MED ORDER — HEPARIN (PORCINE) IN NACL 1000-0.9 UT/500ML-% IV SOLN
INTRAVENOUS | Status: AC
  Filled 2019-05-23: qty 1000

## 2019-05-23 MED FILL — BRILINTA 90 MG TABLET: 90 | 30 days supply | Qty: 60 | Fill #0

## 2019-05-23 SURGICAL SUPPLY — 17 items
BAG SNAP BAND KOVER 36X36 (MISCELLANEOUS) ×2 IMPLANT
BALLN WOLVERINE 3.00X10 (BALLOONS) ×2
BALLOON WOLVERINE 3.00X10 (BALLOONS) ×1 IMPLANT
CATH 5FR JL3.5 JR4 ANG PIG MP (CATHETERS) ×2 IMPLANT
CATH VISTA GUIDE 6FR XBLAD3.5 (CATHETERS) ×2 IMPLANT
COVER DOME SNAP 22 D (MISCELLANEOUS) ×2 IMPLANT
DEVICE RAD COMP TR BAND LRG (VASCULAR PRODUCTS) ×2 IMPLANT
GLIDESHEATH SLEND SS 6F .021 (SHEATH) ×2 IMPLANT
GUIDEWIRE INQWIRE 1.5J.035X260 (WIRE) ×1 IMPLANT
INQWIRE 1.5J .035X260CM (WIRE) ×2
KIT ENCORE 26 ADVANTAGE (KITS) ×2 IMPLANT
KIT HEART LEFT (KITS) ×2 IMPLANT
PACK CARDIAC CATHETERIZATION (CUSTOM PROCEDURE TRAY) ×2 IMPLANT
SYR MEDRAD MARK 7 150ML (SYRINGE) ×2 IMPLANT
TRANSDUCER W/STOPCOCK (MISCELLANEOUS) ×2 IMPLANT
TUBING CIL FLEX 10 FLL-RA (TUBING) ×2 IMPLANT
WIRE COUGAR XT STRL 190CM (WIRE) ×2 IMPLANT

## 2019-05-23 NOTE — Discharge Summary (Addendum)
Discharge Summary    Patient ID: Ryan [name] MRN: [id number]; DOB: 09-22-1966  Admit date: 05/23/2019 Discharge date: 05/23/2019  Primary Care Provider: Nolene Ebbs, MD  Primary Cardiologist: [name], MD ]  Discharge Diagnoses    Active Problems:   Unstable angina ([place])   Allergies Allergies  Allergen Reactions  . Hydrocodone Hives and Nausea And Vomiting    Diagnostic Studies/Procedures    CORONARY BALLOON ANGIOPLASTY  LEFT HEART CATH AND CORONARY ANGIOGRAPHY  Conclusion    Ost 3rd Mrg to 3rd Mrg lesion is 100% stenosed.  Mid RCA lesion is 40% stenosed.  Prox LAD to Mid LAD lesion is 80% stenosed.  Scoring balloon angioplasty was performed using a BALLOON WOLVERINE 3.00X10.  Post intervention, there is a 10% residual stenosis.  There is mild to moderate left ventricular systolic dysfunction.  The left ventricular ejection fraction is 35-45% by visual estimate.  There is no mitral valve regurgitation.   1. Severe restenosis in the mid LAD stented segment. 2. Successful scoring balloon angioplasty mid LAD stented segment 3. Ostial stenosis Diagonal branch (jailed by LAD stents) 4. Mild to moderate non-obstructive disease in the mid RCA 5. Moderate LV systolic dysfunction with anteroapical hypokinesis (c/w findings on echo in 2018 at [facility]). LVEF=35-45%.   Recommendations: Continue ASA and Brilinta indefinitely. He had been on Brilinta 60 mg twice daily at home. Will increase to Brilinta 90 mg po BID. Continue other medical therapy. Same day PCI discharge.    Diagnostic Dominance: Right  Intervention       History of Present Illness     Ryan [name] is a 53 y.o. male with hx of CAD, peripheral vascular disease, poorly controlled insulin-dependent diabetes mellitus, tobacco abuse, hypertension and hyperlipidemia presented for outpatient cath.   He had an anterior STEMI in January 2013 with cardiac arrest and found to have  an occluded mid LAD treated with a drug eluting stent. The third OM branch was chronically occluded and the RCA had mild disease. His post MI LVEF was 35% but this improved and last echo in March 2013 with normal LV systolic function. Cardiac cath in November 2017 with severe stent restenosis in the LAD, treated with a cutting balloon angioplasty.  OnAugust 28, 2018,he had ananterior wall STEMI which was treated at [facility]).Per records, he presented with complaint of chest pain with radiation to right arm, c/w previous MI pain. EKG showed STEMI anterior wall. Code STEMI was activated.He was take to the cath lab. Procedure was performed by Dr. Wyline Copas at [facility]. He was found to have acute occlusion of the prox LAD, just proximal to old stents. This was sucessfully treated with PCI/ Xience Drug Eluting stent. He also underwent POBA to the ostial diag thru stent strut. EF was 40%. Troponin peaked at 10. He was placed on DAPT w/ ASA + Brilinta(Previouslyon Plavix, discontinued).  He has also required surgical revascularization for peripheral vascular disease.InApril2019,he underwent vascular surgery by Dr. Trula Slade. He underwent right femoral to AK popliteal bypass with saphenous vein and right femoral endarterectomy.  Seen by APP for evaluation right arm pain, which has historically been his anginal equivalent and required SL nitro. Increased Coreg to 12.5 mg BID  and scheduled for cath.   Hospital Course     Consultants: None  Cath showed severe restenosis in the mid LAD stented segment s/p successful scoring balloon angioplasty mid LAD stented segment. Detailed report as above. Moderate LV systolic dysfunction with anteroapical hypokinesis (c/w  findings on echo in 2018 at [facility]). LVEF=35-45%. No complications. Continue ASA and Brilinta indefinitely. Increased Brilinta to 90 mg po BID. Felt good candidate for same day PCI. Consider lipid panel as  outpatient and change to high intensity.    The patient been seen by Dr. [name] today and deemed ready for discharge home. All follow-up appointments have been scheduled. Discharge medications are listed below.    Discharge Vitals Blood pressure 118/81, pulse 79, temperature 97.6 F (36.4 C), temperature source Skin, resp. rate 20, height 5\' 9"  (1.753 m), weight 97.1 kg, SpO2 96 %.  Filed Weights   05/23/19 0950  Weight: 97.1 kg    Physical Exam  Constitutional: He is oriented to person, place, and time and well-developed, well-nourished, and in no distress.  HENT:  Head: Normocephalic and atraumatic.  Eyes: Pupils are equal, round, and reactive to light. EOM are normal.  Neck: Normal range of motion. Neck supple.  Cardiovascular: Normal rate and regular rhythm.  TR band in place  Pulmonary/Chest: Effort normal and breath sounds normal.  Abdominal: Soft.  Musculoskeletal: Normal range of motion.  Neurological: He is alert and oriented to person, place, and time.  Skin: Skin is warm and dry.  Psychiatric: Affect normal.    Labs & Radiologic Studies    CBC Recent Labs    05/21/19 1614  WBC 10.6  HGB 15.5  HCT 47.1  MCV 96  PLT 194   Basic Metabolic Panel Recent Labs    63/84/53 1614  NA 139  K 4.4  CL 102  CO2 24  GLUCOSE 329*  BUN 12  CREATININE 1.16  CALCIUM 8.9    Disposition   Pt is being discharged home today in good condition.  Follow-up Plans & Appointments    Follow-up Information    Allayne Butcher, PA-C Follow up on 06/13/2019.   Specialties: Cardiology, Radiology Why: @10 :00 am for hospital follow up Contact information: [address] [place] [address] [phone]          Discharge Instructions    Amb Referral to Cardiac Rehabilitation   Complete by: As directed    Diagnosis: PTCA   After initial evaluation and assessments completed: Virtual Based Care may be provided alone or in conjunction with Phase 2  Cardiac Rehab based on patient barriers.: Yes   Diet - low sodium heart healthy   Complete by: As directed    Discharge instructions   Complete by: As directed    No driving for 48. No lifting over 5 lbs for 1 week. No sexual activity for 1 week. You may return to work on 05/28/2019. Keep procedure site clean & dry. If you notice increased pain, swelling, bleeding or pus, call/return!  You may shower, but no soaking baths/hot tubs/pools for 1 week.   Increase activity slowly   Complete by: As directed       Discharge Medications   Allergies as of 05/23/2019      Reactions   Hydrocodone Hives, Nausea And Vomiting      Medication List    TAKE these medications   aspirin EC 81 MG tablet Take 1 tablet (81 mg total) by mouth daily. Notes to patient: Next due 6/20   carvedilol 6.25 MG tablet Commonly known as: COREG Take 2 tablets (12.5 mg total) by mouth 2 (two) times daily. What changed: how much to take   cyclobenzaprine 10 MG tablet Commonly known as: FLEXERIL Take 1 tablet (10 mg  total) by mouth 2 (two) times daily as needed for muscle spasms.   enalapril 5 MG tablet Commonly known as: VASOTEC TAKE ONE TABLET BY MOUTH TWICE DAILY   isosorbide mononitrate 30 MG 24 hr tablet Commonly known as: IMDUR TAKE 1 TABLET BY MOUTH EVERY DAY   Lantus SoloStar 100 UNIT/ML Solostar Pen Generic drug: Insulin Glargine Inject 80 Units into the skin at bedtime.   nitroGLYCERIN 0.4 MG SL tablet Commonly known as: NITROSTAT Place 1 tablet (0.4 mg total) under the tongue every 5 (five) minutes as needed for chest pain.   oxyCODONE-acetaminophen 10-325 MG tablet Commonly known as: PERCOCET Take 1 tablet by mouth every 4 (four) hours as needed for pain.   pantoprazole 40 MG tablet Commonly known as: PROTONIX Take 40 mg by mouth daily.   simvastatin 80 MG tablet Commonly known as: ZOCOR Take 80 mg by mouth at bedtime.   ticagrelor 90 MG Tabs tablet Commonly known as: Brilinta  Take 1 tablet (90 mg total) by mouth 2 (two) times daily. What changed:   medication strength  how much to take Notes to patient: Take this evenings dose 6/19   traZODone 100 MG tablet Commonly known as: DESYREL Take 100 mg by mouth at bedtime as needed for sleep.        Acute coronary syndrome (MI, NSTEMI, STEMI, etc) this admission?: No.    Outstanding Labs/Studies   Lipid panel as outpatient   Duration of Discharge Encounter   Greater than 30 minutes including physician time.  Lorelei PontSigned, Naithen Rivenburg, PA 05/23/2019, 3:26 PM

## 2019-05-23 NOTE — Progress Notes (Signed)
1315-1400 Discussed exercise instructions, heart healthy diabetic and smoking cessation with the patient. Given 1-800-quit- now plus handouts. Ryan [name] says he does not think he is ready to stop smoking yet. Reiterated the importance of quitting. Ryan [name] says he is not interested in either virtual or phase 2 will  Refer to [facility] in case he changes his mind.Barnet Pall, RN,BSN 05/23/2019 2:10 PM

## 2019-05-23 NOTE — Discharge Instructions (Signed)
Radial Site Care ° °This sheet gives you information about how to care for yourself after your procedure. Your health care provider may also give you more specific instructions. If you have problems or questions, contact your health care provider. °What can I expect after the procedure? °After the procedure, it is common to have: °· Bruising and tenderness at the catheter insertion area. °Follow these instructions at home: °Medicines °· Take over-the-counter and prescription medicines only as told by your health care provider. °Insertion site care °· Follow instructions from your health care provider about how to take care of your insertion site. Make sure you: °? Wash your hands with soap and water before you change your bandage (dressing). If soap and water are not available, use hand sanitizer. °? Change your dressing as told by your health care provider. °? Leave stitches (sutures), skin glue, or adhesive strips in place. These skin closures may need to stay in place for 2 weeks or longer. If adhesive strip edges start to loosen and curl up, you may trim the loose edges. Do not remove adhesive strips completely unless your health care provider tells you to do that. °· Check your insertion site every day for signs of infection. Check for: °? Redness, swelling, or pain. °? Fluid or blood. °? Pus or a bad smell. °? Warmth. °· Do not take baths, swim, or use a hot tub until your health care provider approves. °· You may shower 24-48 hours after the procedure, or as directed by your health care provider. °? Remove the dressing and gently wash the site with plain soap and water. °? Pat the area dry with a clean towel. °? Do not rub the site. That could cause bleeding. °· Do not apply powder or lotion to the site. °Activity ° °· For 24 hours after the procedure, or as directed by your health care provider: °? Do not flex or bend the affected arm. °? Do not push or pull heavy objects with the affected arm. °? Do not  drive yourself home from the hospital or clinic. You may drive 24 hours after the procedure unless your health care provider tells you not to. °? Do not operate machinery or power tools. °· Do not lift anything that is heavier than 10 lb (4.5 kg), or the limit that you are told, until your health care provider says that it is safe. °· Ask your health care provider when it is okay to: °? Return to work or school. °? Resume usual physical activities or sports. °? Resume sexual activity. °General instructions °· If the catheter site starts to bleed, raise your arm and put firm pressure on the site. If the bleeding does not stop, get help right away. This is a medical emergency. °· If you went home on the same day as your procedure, a responsible adult should be with you for the first 24 hours after you arrive home. °· Keep all follow-up visits as told by your health care provider. This is important. °Contact a health care provider if: °· You have a fever. °· You have redness, swelling, or yellow drainage around your insertion site. °Get help right away if: °· You have unusual pain at the radial site. °· The catheter insertion area swells very fast. °· The insertion area is bleeding, and the bleeding does not stop when you hold steady pressure on the area. °· Your arm or hand becomes pale, cool, tingly, or numb. °These symptoms may represent a serious problem   that is an emergency. Do not wait to see if the symptoms will go away. Get medical help right away. Call your local emergency services (911 in the [place] Do not drive yourself to the hospital. °Summary °· After the procedure, it is common to have bruising and tenderness at the site. °· Follow instructions from your health care provider about how to take care of your radial site wound. Check the wound every day for signs of infection. °· Do not lift anything that is heavier than 10 lb (4.5 kg), or the limit that you are told, until your health care provider says  that it is safe. °This information is not intended to replace advice given to you by your health care provider. Make sure you discuss any questions you have with your health care provider. °Document Released: 12/23/2010 Document Revised: 12/26/2017 Document Reviewed: 12/26/2017 °Elsevier Interactive Patient Education © 2019 [employer] ° °

## 2019-05-23 NOTE — Interval H&P Note (Signed)
History and Physical Interval Note:  05/23/2019 10:53 AM  Ryan [name]  has presented today for cardiac cath with the diagnosis of unstable angina - cad - chest pain.  The various methods of treatment have been discussed with the patient and family. After consideration of risks, benefits and other options for treatment, the patient has consented to  Procedure(s): LEFT HEART CATH AND CORONARY ANGIOGRAPHY (N/A) as a surgical intervention.  The patient's history has been reviewed, patient examined, no change in status, stable for surgery.  I have reviewed the patient's chart and labs.  Questions were answered to the patient's satisfaction.    Cath Lab Visit (complete for each Cath Lab visit)  Clinical Evaluation Leading to the Procedure:   ACS: No.  Non-ACS:    Anginal Classification: CCS III  Anti-ischemic medical therapy: Maximal Therapy (2 or more classes of medications)  Non-Invasive Test Results: No non-invasive testing performed  Prior CABG: No previous CABG         Ryan [name]

## 2019-05-26 ENCOUNTER — Encounter (HOSPITAL_COMMUNITY): Payer: Self-pay | Admitting: Cardiovascular Disease

## 2019-06-12 ENCOUNTER — Telehealth: Payer: Self-pay | Admitting: Cardiology

## 2019-06-12 NOTE — Telephone Encounter (Signed)

## 2019-06-13 ENCOUNTER — Encounter: Payer: Self-pay | Admitting: Cardiology

## 2019-06-13 ENCOUNTER — Ambulatory Visit (INDEPENDENT_AMBULATORY_CARE_PROVIDER_SITE_OTHER): Payer: Medicaid Other | Admitting: Cardiology

## 2019-06-13 ENCOUNTER — Other Ambulatory Visit: Payer: Self-pay

## 2019-06-13 VITALS — BP 118/80 | HR 87 | Ht 69.0 in | Wt 219.8 lb

## 2019-06-13 DIAGNOSIS — I2510 Atherosclerotic heart disease of native coronary artery without angina pectoris: Secondary | ICD-10-CM | POA: Diagnosis not present

## 2019-06-13 DIAGNOSIS — Z9861 Coronary angioplasty status: Secondary | ICD-10-CM | POA: Diagnosis not present

## 2019-06-13 MED ORDER — CHANTIX STARTING MONTH PAK 0.5 MG X 11 & 1 MG X 42 PO TABS: ORAL_TABLET | ORAL | 0 refills | Status: DC

## 2019-06-13 NOTE — Progress Notes (Signed)
06/13/2019 Ryan [name]   07/18/1966  [id number]  Primary Physician Fleet Contras, MD Primary Cardiologist: [name], MD  Electrophysiologist: None   Reason for Visit/CC: Post Procedural F/u, S/p Cath w/ PCI for CAD   HPI:  Ryan [name] is a 53 y.o. male who is being seen today for post procedural f/u. He had recent cardiac cath for unstable angina and required PCI.   He has extensive cardiac history and PVD, poorly controlled insulin-dependent diabetes mellitus, tobacco abuse, hypertension, hyperlipidemia and ischemic cardiomyopathy.   He has been followed here by Dr. [name].He had an anterior STEMI in January 2013 with cardiac arrest and found to have an occluded mid LAD treated with a drug eluting stent. The third OM branch was chronically occluded and the RCA had mild disease. His post MI LVEF was 35% but this improved and last echo in March 2013 with normal LV systolic function. Cardiac cath in November 2017 with severe stent restenosis in the LAD, treated with a cutting balloon angioplasty.  OnAugust 28, 2018,he had ananterior wall STEMI which was treated at [facility]).Per records, he presented with complaint of chest pain with radiation to right arm, c/w previous MI pain. EKG showed STEMI anterior wall. Code STEMI was activated.He was take to the cath lab. Procedure was performed by Dr. Rhona Leavens at [facility]. He was found to have acute occlusion of the prox LAD, just proximal to old stents. This was sucessfully treated with PCI/ Xience Drug Eluting stent. He also underwent POBA to the ostial diag thru stent strut. EF was 40%. Troponin peaked at 10. He was placed on DAPT w/ ASA + Brilinta(Previouslyon Plavix, discontinued).  He has also required surgical revascularization for peripheral vascular disease.InApril2019,he underwent vascular surgery by Dr. Myra Gianotti. He underwent right femoral to AK popliteal bypass with saphenous vein and  right femoral endarterectomy.  I evaluated him in clinic 05/21/19. He endorsed CP c/w unstable angina and I set him up for cath. Cath was done 6/19 and showed severe restenosis in the mid LAD stented segment. This was treated w/ successful scoring balloon angioplasty mid LAD stented segment. He also had ostial stenosis of the diagonal branch (jailed by LAD stents), mild to moderate non-obstructive disease in the mid RCA and moderate LV systolic dysfunction with anteroapical hypokinesis (c/w findings on echo in 2018 at [facility]). LVEF=35-45%. He was placed on DAPT w/ ASA and Brilinta and continued on statin,  blocker, ACEi and LA nitrate.  Today, he reports he has done well. No recurrent CP. Reports full med compliance. Tolerating well. Continues to smoke but interested in quitting. Asking about chantix. Radical cath site is ok. Had breakfast today but needs f/u FLP. He has appt w/ PCP next week for DM management and would like to get labs done then. BP today is well controlled at 118/80. HR 87 bpm.   Cardiac Studies  CORONARY BALLOON ANGIOPLASTY 05/23/19  LEFT HEART CATH AND CORONARY ANGIOGRAPHY  Conclusion    Ost 3rd Mrg to 3rd Mrg lesion is 100% stenosed.  Mid RCA lesion is 40% stenosed.  Prox LAD to Mid LAD lesion is 80% stenosed.  Scoring balloon angioplasty was performed using a BALLOON WOLVERINE 3.00X10.  Post intervention, there is a 10% residual stenosis.  There is mild to moderate left ventricular systolic dysfunction.  The left ventricular ejection fraction is 35-45% by visual estimate.  There is no mitral valve regurgitation.  1. Severe restenosis in the mid LAD stented segment. 2.  Successful scoring balloon angioplasty mid LAD stented segment 3. Ostial stenosis Diagonal branch (jailed by LAD stents) 4. Mild to moderate non-obstructive disease in the mid RCA 5. Moderate LV systolic dysfunction with anteroapical hypokinesis (c/w findings on echo in 2018 [facility] LVEF=35-45%.   Recommendations: Continue ASA and Brilinta indefinitely. He had been on Brilinta 60 mg twice daily at home. Will increase to Brilinta 90 mg po BID. Continue other medical therapy. Same day PCI discharge.    Diagnostic Dominance: Right  Intervention      Current Meds  Medication Sig  . aspirin EC 81 MG tablet Take 1 tablet (81 mg total) by mouth daily.  . carvedilol (COREG) 6.25 MG tablet Take 2 tablets (12.5 mg total) by mouth 2 (two) times daily.  . cyclobenzaprine (FLEXERIL) 10 MG tablet Take 1 tablet (10 mg total) by mouth 2 (two) times daily as needed for muscle spasms.  . enalapril (VASOTEC) 5 MG tablet TAKE ONE TABLET BY MOUTH TWICE DAILY  . Insulin Glargine (LANTUS SOLOSTAR) 100 UNIT/ML Solostar Pen Inject 80 Units into the skin at bedtime.   . isosorbide mononitrate (IMDUR) 30 MG 24 hr tablet TAKE 1 TABLET BY MOUTH EVERY DAY  . nitroGLYCERIN (NITROSTAT) 0.4 MG SL tablet Place 1 tablet (0.4 mg total) under the tongue every 5 (five) minutes as needed for chest pain.  Marland Kitchen. oxyCODONE-acetaminophen (PERCOCET) 10-325 MG tablet Take 1 tablet by mouth every 4 (four) hours as needed for pain.  . pantoprazole (PROTONIX) 40 MG tablet Take 40 mg by mouth daily.  . simvastatin (ZOCOR) 80 MG tablet Take 80 mg by mouth at bedtime.  . ticagrelor (BRILINTA) 90 MG TABS tablet Take 1 tablet (90 mg total) by mouth 2 (two) times daily.  . traZODone (DESYREL) 100 MG tablet Take 100 mg by mouth at bedtime as needed for sleep.   Allergies  Allergen Reactions  . Hydrocodone Hives and Nausea And Vomiting   Past Medical History:  Diagnosis Date  . Acute myocardial infarction of other anterior wall, subsequent episode of care 12/12/2011  . Anal fissure   . Atherosclerosis of native artery of both lower extremities with intermittent claudication (HCC) 04/01/2018  . Bruises easily    d/t being on Effient  . CAD (coronary artery disease)    A. Acute Ant STEMI 12/12/2011   .  Cardiomyopathy secondary 12/26/2011  . Chronic total occlusion of artery of the extremities (HCC) 02/26/2012  . Claudication (HCC) 12/26/2011  . Coronary atherosclerosis of native coronary artery    a. ant STEMI with cardiac arrest 2013 s/p DES to mLAD. b. [place] 10/2016 s/p PTCA to mLAD.  . Diabetes mellitus without complication (HCC)   . Essential hypertension 10/23/2016  . GERD (gastroesophageal reflux disease)    "takes tums"  . History of blood transfusion    no abnormal reaction noted  . History of kidney stones   . Hypertension   . Ischemic cardiomyopathy    a. EF 35% in 02/2012 at time of acute MI, improved to normal on subsequent imaging.  . MI (myocardial infarction) (HCC)    AMI 1/13 - complicated by VT/Tosades  . Mixed hyperlipidemia   . Numbness and tingling of right arm 03/31/2013  . Occlusion and stenosis of carotid artery without mention of cerebral infarction 08/26/2012  . PAD (peripheral artery disease) (HCC)    followed by Vascular - aortobifem bypass 01/2013 & left carotid-subclavian artery bypass 04/2013  . Peripheral vascular disease, unspecified (HCC) 12/16/2012  .  PVD (peripheral vascular disease) (HCC) 03/31/2013  . Rectal polyp   . Subclavian steal syndrome 08/11/2013  . Uncontrolled diabetes mellitus (HCC) 10/23/2016   Family History  Problem Relation Age of Onset  . Cancer Father        Lung  . Diabetes Mother   . COPD Mother   . Cancer Maternal Uncle        Colon   Past Surgical History:  Procedure Laterality Date  . ABDOMINAL AORTOGRAM W/LOWER EXTREMITY N/A 06/26/2017   Procedure: Abdominal Aortogram w/Lower Extremity;  Surgeon: Nada Libman, MD;  Location: [facility] INVASIVE CV LAB;  Service: Cardiovascular;  Laterality: N/A;  . ANAL FISSURECTOMY    . AORTA - BILATERAL FEMORAL ARTERY BYPASS GRAFT N/A 01/16/2013   Procedure: AORTA BIFEMORAL BYPASS GRAFT;  Surgeon: Nada Libman, MD;  Location: [facility] OR;  Service: Vascular;  Laterality: N/A;  . APPENDECTOMY    .  CARDIAC CATHETERIZATION N/A 10/23/2016   Procedure: Left Heart Cath and Coronary Angiography;  Surgeon: Kathleene Hazel, MD;  Location: [facility] INVASIVE CV LAB;  Service: Cardiovascular;  Laterality: N/A;  . CARDIAC CATHETERIZATION N/A 10/23/2016   Procedure: Coronary Balloon Angioplasty;  Surgeon: Kathleene Hazel, MD;  Location: [facility] INVASIVE CV LAB;  Service: Cardiovascular;  Laterality: N/A;  . CAROTID-SUBCLAVIAN BYPASS GRAFT Left 04/03/2013   Procedure: BYPASS GRAFT CAROTID-SUBCLAVIAN;  Surgeon: Nada Libman, MD;  Location: [facility] OR;  Service: Vascular;  Laterality: Left;  . CIRCUMCISION N/A 12/15/2015   Procedure: CIRCUMCISION ADULT;  Surgeon: Malen Gauze, MD;  Location: [facility] ORS;  Service: Urology;  Laterality: N/A;  . COLONOSCOPY    . CORONARY ANGIOPLASTY     stent placed Dec 12, 2011 and 2018  . CORONARY BALLOON ANGIOPLASTY N/A 05/23/2019   Procedure: CORONARY BALLOON ANGIOPLASTY;  Surgeon: Kathleene Hazel, MD;  Location: [facility];  Service: Cardiovascular;  Laterality: N/A;  . DIAGNOSTIC LAPAROSCOPY    . ENDARTERECTOMY FEMORAL Right 03/13/2018   Procedure: FEMORAL ENDARTERECTOMY RIGHT;  Surgeon: Nada Libman, MD;  Location: [facility] OR;  Service: Vascular;  Laterality: Right;  . FEMORAL-POPLITEAL BYPASS GRAFT Right 03/13/2018   Procedure: FEMORAL-POPLITEAL ARTERY BYPASS WITH NON-REVERSE VEIN RIGHT;  Surgeon: Nada Libman, MD;  Location: [facility] OR;  Service: Vascular;  Laterality: Right;  . GROIN DISSECTION Right 03/13/2018   Procedure: RE-DO COMMON FEMORAL ARTERY EXPOSURE;  Surgeon: Nada Libman, MD;  Location: [facility] OR;  Service: Vascular;  Laterality: Right;  . I&D EXTREMITY Left 04/18/2013   Procedure: debridement of left neck lymphocele;  Surgeon: Nada Libman, MD;  Location: [facility] OR;  Service: Vascular;  Laterality: Left;  I and D of left neck  . I&D EXTREMITY Right 11/21/2014   Procedure: IRRIGATION AND DEBRIDEMENT RIGHT HAND;  Surgeon: Dominica Severin, MD;   Location: [facility] OR;  Service: Orthopedics;  Laterality: Right;  . LEFT HEART CATH AND CORONARY ANGIOGRAPHY N/A 05/23/2019   Procedure: LEFT HEART CATH AND CORONARY ANGIOGRAPHY;  Surgeon: Kathleene Hazel, MD;  Location: [facility];  Service: Cardiovascular;  Laterality: N/A;  . LEFT HEART CATHETERIZATION WITH CORONARY ANGIOGRAM N/A 12/12/2011   Procedure: LEFT HEART CATHETERIZATION WITH CORONARY ANGIOGRAM;  Surgeon: Kathleene Hazel, MD;  Location: [facility] CATH LAB;  Service: Cardiovascular;  Laterality: N/A;  . LOWER EXTREMITY ANGIOGRAM N/A 01/31/2012   Procedure: LOWER EXTREMITY ANGIOGRAM;  Surgeon: Kathleene Hazel, MD;  Location: [facility] CATH LAB;  Service: Cardiovascular;  Laterality: N/A;  . LOWER EXTREMITY ANGIOGRAPHY N/A 04/23/2018  Procedure: LOWER EXTREMITY ANGIOGRAPHY;  Surgeon: Serafina Mitchell, MD;  Location: [facility];  Service: Cardiovascular;  Laterality: N/A;  . PERCUTANEOUS CORONARY STENT INTERVENTION (PCI-S) Right 12/12/2011   Procedure: PERCUTANEOUS CORONARY STENT INTERVENTION (PCI-S);  Surgeon: Burnell Blanks, MD;  Location: [facility] CATH LAB;  Service: Cardiovascular;  Laterality: Right;  . PERIPHERAL VASCULAR INTERVENTION Left 04/23/2018   Procedure: PERIPHERAL VASCULAR INTERVENTION;  Surgeon: Serafina Mitchell, MD;  Location: [place] CV LAB;  Service: Cardiovascular;  Laterality: Left;  superficial femoral  . REPAIR EXTENSOR TENDON Right 11/21/2014   Procedure: WITH REPAIR/RECONSTRUCTION OF EXTENSOR TENDONS AS NEEDED;  Surgeon: Roseanne Kaufman, MD;  Location: [place];  Service: Orthopedics;  Laterality: Right;   Social History   Socioeconomic History  . Marital status: Divorced    Spouse name: Not on file  . Number of children: Not on file  . Years of education: Not on file  . Highest education level: Not on file  Occupational History    Employer: OTHER  Social Needs  . Financial resource strain: Not on file  . Food insecurity    Worry: Not on  file    Inability: Not on file  . Transportation needs    Medical: Not on file    Non-medical: Not on file  Tobacco Use  . Smoking status: Current Every Day Smoker    Packs/day: 0.25    Years: 25.00    Pack years: 6.25    Types: Cigarettes  . Smokeless tobacco: Never Used  . Tobacco comment: 1-2 cigs a day  Substance and Sexual Activity  . Alcohol use: No  . Drug use: No  . Sexual activity: Yes  Lifestyle  . Physical activity    Days per week: Not on file    Minutes per session: Not on file  . Stress: Not on file  Relationships  . Social Herbalist on phone: Not on file    Gets together: Not on file    Attends religious service: Not on file    Active member of club or organization: Not on file    Attends meetings of clubs or organizations: Not on file    Relationship status: Not on file  . Intimate partner violence    Fear of current or ex partner: Not on file    Emotionally abused: Not on file    Physically abused: Not on file    Forced sexual activity: Not on file  Other Topics Concern  . Not on file  Social History Narrative   Disabled - no longer works     Lipid Panel     Component Value Date/Time   CHOL 180 11/09/2017 1050   TRIG 299 (H) 11/09/2017 1050   HDL 31 (L) 11/09/2017 1050   CHOLHDL 5.8 (H) 11/09/2017 1050   CHOLHDL 7.9 12/13/2011 0607   VLDL 66 (H) 12/13/2011 0607   LDLCALC 89 11/09/2017 1050    Review of Systems: General: negative for chills, fever, night sweats or weight changes.  Cardiovascular: negative for chest pain, dyspnea on exertion, edema, orthopnea, palpitations, paroxysmal nocturnal dyspnea or shortness of breath Dermatological: negative for rash Respiratory: negative for cough or wheezing Urologic: negative for hematuria Abdominal: negative for nausea, vomiting, diarrhea, bright red blood per rectum, melena, or hematemesis Neurologic: negative for visual changes, syncope, or dizziness All other systems reviewed and  are otherwise negative except as noted above.   Physical Exam:  Blood pressure 118/80, pulse 87, height 5'  9" (1.753 m), weight 219 lb 12.8 oz (99.7 kg), SpO2 97 %.  General appearance: alert, cooperative and no distress Neck: no carotid bruit and no JVD Lungs: clear to auscultation bilaterally Heart: regular rate and rhythm, S1, S2 normal, no murmur, click, rub or gallop Extremities: extremities normal, atraumatic, no cyanosis or edema Pulses: 2+ and symmetric Skin: Skin color, texture, turgor normal. No rashes or lesions Neurologic: Grossly normal  EKG not performed -- personally reviewed   ASSESSMENT AND PLAN:   1. CAD: History of myocardial infarction x2 with coronary stenting as outlined above in HPI. Recent cath 6/19 for unstable angina showed severe restenosis in the mid LAD stented segment. This was treated w/ successful scoring balloon angioplasty mid LAD stented segment. Also had ostial stenosis of the diagonal branch (jailed by LAD stents), mild to moderate non-obstructive disease in the mid RCA and moderate LV systolic dysfunction with anteroapical hypokinesis (c/w findings on [facility]). LVEF=35-45%. He is doing well with no recurrent CP. BP and HR controlled. Plan DAPT w/ ASA and Brilinta indefinately, along with statin,  blocker and ARB.   2. Ischemic CM/ Chronic Systolic HF: EF 35-40% on recent cath, c/w with prior studies. He is euvolemic on exam. No dyspnea. Continue ACEi and  blocker.   3. HTN: controlled on current regimen.   4. HLD: He reports full compliance with statin therapy. He is on simvastatin. No recent lipid panel on file.LDL goal in the setting of CAD and peripheral vascular disease is less than 70 mg/dL. Unfortunately, he is not fasting today but has f/u with his PCP next week and would like to get labs done there. He will ask for FLP to be faxed to our office. I will also send notification to PCP to see if they can check.  5. T2DM:  poorly controlled. He states he has f/u with his PCP next week for diabetes management.   6. Tobacco Abuse: discussed the importance of smoking cessation given his CVD. He is interested in quitting and had some success with chantix in the past but unfortunately started back. He would like to retry. Will order chantix starter pack.   7. PVD: InApril2019,he underwent vascular surgery by Dr. Myra GianottiBrabham. He underwent right femoral to popliteal bypass with saphenous vein and right femoral endarterectomy.Stable without claudication. Continue antiplatelet therapy along with statin and antihypertensives for BP control. Smoking cessation advised. Vascular surgery continues to follow him.   Follow-Up w/ Dr. Clifton JamesMcAlhany in 3 months.   Marcellius Montagna Delmer IslamSimmons PA-C, [facility] 06/13/2019 10:30 AM

## 2019-06-13 NOTE — Patient Instructions (Signed)
Medication Instructions:   Your physician recommends that you continue on your current medications as directed. Please refer to the Current Medication list given to you today.'  If you need a refill on your cardiac medications before your next appointment, please call your pharmacy.   Lab work: MAKE SURE YOU REQUEST FOR LIPIDS AND LIVER PANEL FROM PCP   If you have labs (blood work) drawn today and your tests are completely normal, you will receive your results only by: Marland Kitchen MyChart Message (if you have MyChart) OR . A paper copy in the mail If you have any lab test that is abnormal or we need to change your treatment, we will call you to review the results.  Testing/Procedures: NONE ORDERED  TODAY   Follow-Up: At [facility], you and your health needs are our priority.  As part of our continuing mission to provide you with exceptional heart care, we have created designated Provider Care Teams.  These Care Teams include your primary Cardiologist (physician) and Advanced Practice Providers (APPs -  Physician Assistants and Nurse Practitioners) who all work together to provide you with the care you need, when you need it. You will need a follow up appointment in 3  months.  Please call our office 2 months in advance to schedule this appointment.  You may see [name], MD or one of the following Advanced Practice Providers on your designated Care Team:   [place], PA-C3 Melina Copa, PA-C . Ermalinda Barrios, PA-C  Any Other Special Instructions Will Be Listed Below (If Applicable).

## 2019-06-19 ENCOUNTER — Other Ambulatory Visit: Payer: Self-pay | Admitting: Cardiovascular Disease

## 2019-08-06 ENCOUNTER — Other Ambulatory Visit: Payer: Self-pay | Admitting: Cardiology

## 2019-08-06 NOTE — Telephone Encounter (Signed)
This was filled by Ryan [name] in July, does she want to refill or does patient need to contact PCP for refill.

## 2019-09-11 ENCOUNTER — Other Ambulatory Visit: Payer: Self-pay

## 2019-09-11 ENCOUNTER — Encounter: Payer: Self-pay | Admitting: Cardiovascular Disease

## 2019-09-11 ENCOUNTER — Ambulatory Visit (INDEPENDENT_AMBULATORY_CARE_PROVIDER_SITE_OTHER): Payer: Medicaid Other | Admitting: Cardiovascular Disease

## 2019-09-11 VITALS — BP 98/60 | HR 82 | Ht 69.0 in | Wt 218.0 lb

## 2019-09-11 DIAGNOSIS — I255 Ischemic cardiomyopathy: Secondary | ICD-10-CM | POA: Diagnosis not present

## 2019-09-11 DIAGNOSIS — E782 Mixed hyperlipidemia: Secondary | ICD-10-CM

## 2019-09-11 DIAGNOSIS — I25118 Atherosclerotic heart disease of native coronary artery with other forms of angina pectoris: Secondary | ICD-10-CM

## 2019-09-11 DIAGNOSIS — I739 Peripheral vascular disease, unspecified: Secondary | ICD-10-CM

## 2019-09-11 MED ORDER — ISOSORBIDE MONONITRATE ER 60 MG PO TB24: 60.0000 mg | ORAL_TABLET | Freq: Every day | ORAL | 3 refills | Status: DC

## 2019-09-11 NOTE — Patient Instructions (Signed)
Medication Instructions:  Your physician has recommended you make the following change in your medication:  1.) increase isosorbide (IMDUR) to 60 mg daily  If you need a refill on your cardiac medications before your next appointment, please call your pharmacy.   Lab work: none If you have labs (blood work) drawn today and your tests are completely normal, you will receive your results only by: Marland Kitchen MyChart Message (if you have MyChart) OR . A paper copy in the mail If you have any lab test that is abnormal or we need to change your treatment, we will call you to review the results.  Testing/Procedures: none Follow-Up: At [facility], you and your health needs are our priority.  As part of our continuing mission to provide you with exceptional heart care, we have created designated Provider Care Teams.  These Care Teams include your primary Cardiologist (physician) and Advanced Practice Providers (APPs -  Physician Assistants and Nurse Practitioners) who all work together to provide you with the care you need, when you need it. You will need a follow up appointment in 6 months.  Please call our office 2 months in advance to schedule this appointment.  You may see [name], MD or one of the following Advanced Practice Providers on your designated Care Team:   [place], PA-C Melina Copa, PA-C . Ermalinda Barrios, PA-C  Any Other Special Instructions Will Be Listed Below (If Applicable).

## 2019-09-11 NOTE — Progress Notes (Signed)
Chief Complaint  Patient presents with  . Follow-up    CAD    History of Present Illness: 53 yo male with history of ongoing tobacco abuse, CAD, HLD, PAD, HLD, ischemic cardiomyopathy and insulin dependent diabetes mellitus who is here today for cardiac follow up. He had an anterior STEMI in January 2013 with cardiac arrest and found to have an occluded mid LAD treated with a drug eluting stent. The third OM branch was chronically occluded and the RCA had mild disease. His post MI LVEF was 35% but this was found to be normal on follow up echo. Cardiac cath in November 2017 with severe stent restenosis in the LAD, treated with a cutting balloon angioplasty. He did not keep his f/u appt in our office following his PCI. He has been non-compliant with follow up, medications over the past few years and continues to smoke. Admitted to [facility] in August 2018 with an anterior STEMI. His LAD was occluded proximal to the old stent and this was treated with a drug eluting stent. Angioplasty performed on the ostial Diagonal branch. LVEF was 40% following the MI. He had unstable angina in June 2020 and repeat cardiac cath showed severe restenosis in the mid LAD stented segment which was treated with cutting balloon angioplasty. He also had ostial stenosis of the diagonal branch (jailed by LAD stents), mild to moderate non-obstructive disease in the mid RCA and moderate LV systolic dysfunction with anteroapical hypokinesis (c/w findings on echo in 2018 at [facility]). LVEF=35-45%. He was placed on DAPT w/ ASA and Brilinta and continued on statin, ? blocker, ACEi and long acting nitrate  His PAD is followed in [facility] by Dr. Myra Gianotti. In 2013 he was found to have occlusion of his distal aorta and left subclavian artery. He underwent aortobifemoral bypass in February 2014 and left carotid to subclavian bypass in May 2014.  InApril2019,he underwent right femoral to AK popliteal bypass with saphenous  vein and right femoral endarterectomy  He is here today for follow up. The patient denies any dyspnea, palpitations, lower extremity edema, orthopnea, PND, dizziness, near syncope or syncope. He has chest pain several times per week that is responsive to NTG.      Primary Care Physician: Fleet Contras, MD   Past Medical History:  Diagnosis Date  . Acute myocardial infarction of other anterior wall, subsequent episode of care 12/12/2011  . Anal fissure   . Atherosclerosis of native artery of both lower extremities with intermittent claudication (HCC) 04/01/2018  . Bruises easily    d/t being on Effient  . CAD (coronary artery disease)    A. Acute Ant STEMI 12/12/2011   . Cardiomyopathy secondary 12/26/2011  . Chronic total occlusion of artery of the extremities (HCC) 02/26/2012  . Claudication (HCC) 12/26/2011  . Coronary atherosclerosis of native coronary artery    a. ant STEMI with cardiac arrest 2013 s/p DES to mLAD. b. [place] 10/2016 s/p PTCA to mLAD.  . Diabetes mellitus without complication (HCC)   . Essential hypertension 10/23/2016  . GERD (gastroesophageal reflux disease)    "takes tums"  . History of blood transfusion    no abnormal reaction noted  . History of kidney stones   . Hypertension   . Ischemic cardiomyopathy    a. EF 35% in 02/2012 at time of acute MI, improved to normal on subsequent imaging.  . MI (myocardial infarction) (HCC)    AMI 1/13 - complicated by VT/Tosades  . Mixed hyperlipidemia   .  Numbness and tingling of right arm 03/31/2013  . Occlusion and stenosis of carotid artery without mention of cerebral infarction 08/26/2012  . PAD (peripheral artery disease) (HCC)    followed by [facility] - aortobifem bypass 01/2013 & left carotid-subclavian artery bypass 04/2013  . Peripheral vascular disease, unspecified (HCC) 12/16/2012  . PVD (peripheral vascular disease) (HCC) 03/31/2013  . Rectal polyp   . Subclavian steal syndrome 08/11/2013  . Uncontrolled diabetes mellitus  (HCC) 10/23/2016    Past Surgical History:  Procedure Laterality Date  . ABDOMINAL AORTOGRAM W/LOWER EXTREMITY N/A 06/26/2017   Procedure: Abdominal Aortogram w/Lower Extremity;  Surgeon: Nada Libman, MD;  Location: [facility] INVASIVE CV LAB;  Service: Cardiovascular;  Laterality: N/A;  . ANAL FISSURECTOMY    . AORTA - BILATERAL FEMORAL ARTERY BYPASS GRAFT N/A 01/16/2013   Procedure: AORTA BIFEMORAL BYPASS GRAFT;  Surgeon: Nada Libman, MD;  Location: [facility] OR;  Service: Vascular;  Laterality: N/A;  . APPENDECTOMY    . CARDIAC CATHETERIZATION N/A 10/23/2016   Procedure: Left Heart Cath and Coronary Angiography;  Surgeon: Kathleene Hazel, MD;  Location: [facility] INVASIVE CV LAB;  Service: Cardiovascular;  Laterality: N/A;  . CARDIAC CATHETERIZATION N/A 10/23/2016   Procedure: Coronary Balloon Angioplasty;  Surgeon: Kathleene Hazel, MD;  Location: [facility] INVASIVE CV LAB;  Service: Cardiovascular;  Laterality: N/A;  . CAROTID-SUBCLAVIAN BYPASS GRAFT Left 04/03/2013   Procedure: BYPASS GRAFT CAROTID-SUBCLAVIAN;  Surgeon: Nada Libman, MD;  Location: [facility] OR;  Service: Vascular;  Laterality: Left;  . CIRCUMCISION N/A 12/15/2015   Procedure: CIRCUMCISION ADULT;  Surgeon: Malen Gauze, MD;  Location: [facility] ORS;  Service: Urology;  Laterality: N/A;  . COLONOSCOPY    . CORONARY ANGIOPLASTY     stent placed Dec 12, 2011 and 2018  . CORONARY BALLOON ANGIOPLASTY N/A 05/23/2019   Procedure: CORONARY BALLOON ANGIOPLASTY;  Surgeon: Kathleene Hazel, MD;  Location: [facility] INVAS[facility] CV LAB;  Service: Cardiovascular;  Laterality: N/A;  . DIAGNOSTIC LAPAROSCOPY    . ENDARTERECTOMY FEMORAL Right 03/13/2018   Procedure: FEMORAL ENDARTERECTOMY RIGHT;  Surgeon: Nada Libman, MD;  Location: [facility] OR;  Service: Vascular;  Laterality: Right;  . FEMORAL-POPLITEAL BYPASS GRAFT Right 03/13/2018   Procedure: FEMORAL-POPLITEAL ARTERY BYPASS WITH NON-REVERSE VEIN RIGHT;  Surgeon: Nada Libman, MD;  Location: [facility] OR;   Service: Vascular;  Laterality: Right;  . GROIN DISSECTION Right 03/13/2018   Procedure: RE-DO COMMON FEMORAL ARTERY EXPOSURE;  Surgeon: Nada Libman, MD;  Location: [facility] OR;  Service: Vascular;  Laterality: Right;  . I&D EXTREMITY Left 04/18/2013   Procedure: debridement of left neck lymphocele;  Surgeon: Nada Libman, MD;  Location: [facility] OR;  Service: Vascular;  Laterality: Left;  I and D of left neck  . I&D EXTREMITY Right 11/21/2014   Procedure: IRRIGATION AND DEBRIDEMENT RIGHT HAND;  Surgeon: Dominica Severin, MD;  Location: [facility] OR;  Service: Orthopedics;  Laterality: Right;  . LEFT HEART CATH AND CORONARY ANGIOGRAPHY N/A 05/23/2019   Procedure: LEFT HEART CATH AND CORONARY ANGIOGRAPHY;  Surgeon: Kathleene Hazel, MD;  [facility]: [facility];  Service: Cardiovascular;  Laterality: N/A;  . LEFT HEART CATHETERIZATION WITH CORONARY ANGIOGRAM N/A 12/12/2011   Procedure: LEFT HEART CATHETERIZATION WITH CORONARY ANGIOGRAM;  Surgeon: Kathleene Hazel, MD;  Location: [facility] CATH LAB;  Service: Cardiovascular;  Laterality: N/A;  . LOWER EXTREMITY ANGIOGRAM N/A 01/31/2012   Procedure: LOWER EXTREMITY ANGIOGRAM;  Surgeon: Kathleene Hazel, MD;  Location: [facility] CATH LAB;  Service: Cardiovascular;  Laterality: N/A;  . LOWER EXTREMITY ANGIOGRAPHY N/A 04/23/2018   Procedure: LOWER EXTREMITY ANGIOGRAPHY;  Surgeon: Nada LibmanBrabham, Vance W, MD;  Location: [facility] INVASIVE CV [facility];  Service: Cardiovascular;  Laterality: N/A;  . PERCUTANEOUS CORONARY STENT INTERVENTION (PCI-S) Right 12/12/2011   Procedure: PERCUTANEOUS CORONARY STENT INTERVENTION (PCI-S);  Surgeon: Kathleene Hazelhristopher D Sahas Sluka, MD;  Location: [facility] CATH LAB;  Service: Cardiovascular;  Laterality: Right;  . PERIPHERAL VASCULAR INTERVENTION Left 04/23/2018   Procedure: PERIPHERAL VASCULAR INTERVENTION;  Surgeon: Nada LibmanBrabham, Vance W, MD;  Location: [facility] INVASIVE CV LAB;  Service: Cardiovascular;  Laterality: Left;  superficial femoral  . REPAIR EXTENSOR TENDON Right  11/21/2014   Procedure: WITH REPAIR/RECONSTRUCTION OF EXTENSOR TENDONS AS NEEDED;  Surgeon: Dominica SeverinWilliam Gramig, MD;  Location: [facility] OR;  Service: Orthopedics;  Laterality: Right;    Current Outpatient Medications  Medication Sig Dispense Refill  . aspirin EC 81 MG tablet Take 1 tablet (81 mg total) by mouth daily. 90 tablet 3  . carvedilol (COREG) 6.25 MG tablet Take 2 tablets (12.5 mg total) by mouth 2 (two) times daily. 180 tablet 3  . cyclobenzaprine (FLEXERIL) 10 MG tablet Take 1 tablet (10 mg total) by mouth 2 (two) times daily as needed for muscle spasms. 30 tablet 0  . enalapril (VASOTEC) 5 MG tablet TAKE ONE TABLET BY MOUTH TWICE DAILY 60 tablet 2  . Insulin Glargine (LANTUS SOLOSTAR) 100 UNIT/ML Solostar Pen Inject 80 Units into the skin at bedtime.     Marland Kitchen. oxyCODONE-acetaminophen (PERCOCET) 10-325 MG tablet Take 1 tablet by mouth every 4 (four) hours as needed for pain.    . pantoprazole (PROTONIX) 40 MG tablet Take 40 mg by mouth daily.    . simvastatin (ZOCOR) 80 MG tablet Take 80 mg by mouth at bedtime.    . ticagrelor (BRILINTA) 90 MG TABS tablet Take 1 tablet (90 mg total) by mouth 2 (two) times daily. 60 tablet 6  . traZODone (DESYREL) 100 MG tablet Take 100 mg by mouth at bedtime as needed for sleep.    . isosorbide mononitrate (IMDUR) 60 MG 24 hr tablet Take 1 tablet (60 mg total) by mouth daily. 90 tablet 3  . nitroGLYCERIN (NITROSTAT) 0.4 MG SL tablet Place 1 tablet (0.4 mg total) under the tongue every 5 (five) minutes as needed for chest pain. 25 tablet 3   No current facility-administered medications for this visit.     Allergies  Allergen Reactions  . Hydrocodone Hives and Nausea And Vomiting    Social History   Socioeconomic History  . Marital status: Divorced    Spouse name: Not on file  . Number of children: Not on file  . Years of education: Not on file  . Highest education level: Not on file  Occupational History    Employer: OTHER  Social Needs  . Financial  resource strain: Not on file  . Food insecurity    Worry: Not on file    Inability: Not on file  . Transportation needs    Medical: Not on file    Non-medical: Not on file  Tobacco Use  . Smoking status: Current Every Day Smoker    Packs/day: 0.25    Years: 25.00    Pack years: 6.25    Types: Cigarettes  . Smokeless tobacco: Never Used  . Tobacco comment: 1-2 cigs a day  Substance and Sexual Activity  . Alcohol use: No  . Drug use: No  . Sexual activity: Yes  Lifestyle  . Physical activity  Days per week: Not on file    Minutes per session: Not on file  . Stress: Not on file  Relationships  . Social Musicianconnections    Talks on phone: Not on file    Gets together: Not on file    Attends religious service: Not on file    Active member of club or organization: Not on file    Attends meetings of clubs or organizations: Not on file    Relationship status: Not on file  . Intimate partner violence    Fear of current or ex partner: Not on file    Emotionally abused: Not on file    Physically abused: Not on file    Forced sexual activity: Not on file  Other Topics Concern  . Not on file  Social History Narrative   Disabled - no longer works    Family History  Problem Relation Age of Onset  . Cancer Father        Lung  . Diabetes Mother   . COPD Mother   . Cancer Maternal Uncle        Colon    Review of Systems:  As stated in the HPI and otherwise negative.   BP 98/60   Pulse 82   Ht 5\' 9"  (1.753 m)   Wt 218 lb (98.9 kg)   SpO2 98%   BMI 32.19 kg/m   Physical Examination:  General: Well developed, well nourished, NAD  HEENT: OP clear, mucus membranes moist  SKIN: warm, dry. No rashes. Neuro: No focal deficits  Musculoskeletal: Muscle strength 5/5 all ext  Psychiatric: Mood and affect normal  Neck: No JVD, no carotid bruits, no thyromegaly, no lymphadenopathy.  Lungs:Clear bilaterally, no wheezes, rhonci, crackles Cardiovascular: Regular rate and rhythm. No  murmurs, gallops or rubs. Abdomen:Soft. Bowel sounds present. Non-tender.  Extremities: No lower extremity edema. Pulses are 2 + in the bilateral DP/PT.  EKG:  EKG is not ordered today. The ekg ordered today demonstrates   Recent Labs: 05/21/2019: BUN 12; Creatinine, Ser 1.16; Hemoglobin 15.5; Platelets 194; Potassium 4.4; Sodium 139   Lipid Panel Lipid Panel     Component Value Date/Time   CHOL 180 11/09/2017 1050   TRIG 299 (H) 11/09/2017 1050   HDL 31 (L) 11/09/2017 1050   CHOLHDL 5.8 (H) 11/09/2017 1050   CHOLHDL 7.9 12/13/2011 0607   VLDL 66 (H) 12/13/2011 0607   LDLCALC 89 11/09/2017 1050   LABVLDL 60 (H) 11/09/2017 1050     Wt Readings from Last 3 Encounters:  09/11/19 218 lb (98.9 kg)  06/13/19 219 lb 12.8 oz (99.7 kg)  05/23/19 214 lb (97.1 kg)     Other studies Reviewed: Additional studies/ records that were reviewed today include: . Review of the above records demonstrates:    Assessment and Plan:   1. CAD with stable angina: Still having some episodes of chest pain that is responsive to NTG. I will increase his Imdur to 60 mg daily. Will continue ASA, Brilinta, statin, Imdur and beta blocker.    2. PAD: Followed in [facility].   3. Tobacco abuse: I have asked him to stop smoking. He is now down to 3 cigarettes.   4. Hyperlipidemia: Lipids followed in primary care. Pt tells me that his cholesterol has been checked within the last month. Continue statin  5. Ischemic cardiomyopathy: LVEF 35-45% by LV gram June 2020. Will continue Coreg and Ace-inh.   Current medicines are reviewed at length with the patient today.  The patient does not have concerns regarding medicines.  The following changes have been made:  no change  Labs/ tests ordered today include:   No orders of the defined types were placed in this encounter.   Disposition:   FU with me in 6  months  Signed, [name], MD 09/11/2019 1:18 PM    [facility] [address], [place], [place]  [address] Phone: [phone]; Fax: [phone]

## 2019-10-03 ENCOUNTER — Telehealth: Payer: Self-pay

## 2019-10-03 NOTE — Telephone Encounter (Signed)
I spoke with patient. He read the isosorbide bottle wrong. Since he was seen on 10/8 and his dose was increased from 30 mg to 60 mg--he has been taking TWO of the 60 mg tablets daily instead of ONE.  Realized this today and understands to check the bottle and only take 60 mg daily.  His chest pain is better during the day but daily -late evening or early morning he still needs ntg. It is the same pain he has been experiencing, but less frequently.  It is always relieved by the ntg.  Pt aware I am forwarding to Dr. Angelena Form to review and will call him back with any new recommendations.

## 2019-10-03 NOTE — Telephone Encounter (Signed)
Patient called and stated that the pharmacy did not give him the 60MG  tablets, they gave him 30MG , and only 30 tablets. I called patients pharmacy and the pharmacist advised me that they filled the 60MG  tablet but gave him a 30 day supply. I called patient to tell him this and he stated that he took a nitroglycerin pill everyday last week. Patient is still having chest pain during the day. Please call to discuss.

## 2019-10-06 MED ORDER — ISOSORBIDE MONONITRATE ER 60 MG PO TB24: 60.0000 mg | ORAL_TABLET | Freq: Two times a day (BID) | ORAL | 11 refills | Status: DC

## 2019-10-06 NOTE — Telephone Encounter (Signed)
Can we have him take Imdur 60 mg po BID? Thanks, chris

## 2019-10-06 NOTE — Telephone Encounter (Signed)
Called patient and instructed to go ahead and take isosorbide 60 mg twice daily - try to take about 12 hours apart.  Pt verbalizes understanding with this plan and was able to repeat instructions back.

## 2019-11-27 ENCOUNTER — Other Ambulatory Visit: Payer: Self-pay | Admitting: Physician Assistant

## 2020-04-05 ENCOUNTER — Encounter: Payer: Self-pay | Admitting: Cardiovascular Disease

## 2020-04-05 ENCOUNTER — Ambulatory Visit (INDEPENDENT_AMBULATORY_CARE_PROVIDER_SITE_OTHER): Payer: Medicaid Other | Admitting: Cardiovascular Disease

## 2020-04-05 ENCOUNTER — Encounter: Payer: Self-pay | Admitting: *Deleted

## 2020-04-05 ENCOUNTER — Other Ambulatory Visit: Payer: Self-pay

## 2020-04-05 VITALS — BP 110/60 | HR 91 | Ht 69.0 in | Wt 214.0 lb

## 2020-04-05 DIAGNOSIS — Z01812 Encounter for preprocedural laboratory examination: Secondary | ICD-10-CM | POA: Diagnosis not present

## 2020-04-05 DIAGNOSIS — I25110 Atherosclerotic heart disease of native coronary artery with unstable angina pectoris: Secondary | ICD-10-CM

## 2020-04-05 NOTE — Progress Notes (Signed)
Chief Complaint  Patient presents with  . Follow-up    CAD   History of Present Illness: 54 yo male with history of ongoing tobacco abuse, CAD, HLD, PAD, HLD, ischemic cardiomyopathy and insulin dependent diabetes mellitus who is here today for cardiac follow up. He had an anterior STEMI in January 2013 with cardiac arrest and found to have an occluded mid LAD treated with a drug eluting stent. The third OM branch was chronically occluded and the RCA had mild disease. His post MI LVEF was 35% but this was found to be normal on follow up echo. Cardiac cath in November 2017 with severe stent restenosis in the LAD, treated with a cutting balloon angioplasty. He did not keep his f/u appt in our office following his PCI. He has been non-compliant with follow up, medications over the past few years and continues to smoke. Admitted to [facility] in August 2018 with an anterior STEMI. His LAD was occluded proximal to the old stent and this was treated with a drug eluting stent. Angioplasty performed on the ostial Diagonal branch. LVEF was 40% following the MI. He had unstable angina in June 2020 and repeat cardiac cath showed severe restenosis in the mid LAD stented segment which was treated with cutting balloon angioplasty. He also had ostial stenosis of the diagonal branch (jailed by LAD stents), mild to moderate non-obstructive disease in the mid RCA and moderate LV systolic dysfunction with anteroapical hypokinesis (c/w findings on echo in 2018 at [facility] LVEF=35-45%. He was placed on DAPT w/ ASA and Brilinta and continued on statin, ? blocker, ACEi and long acting nitrate  His PAD is followed in [facility] by Dr. Myra Gianotti. In 2013 he was found to have occlusion of his distal aorta and left subclavian artery. He underwent aortobifemoral bypass in February 2014 and left carotid to subclavian bypass in May 2014.  InApril2019,he underwent right femoral to AK popliteal bypass with saphenous  vein and right femoral endarterectomy  He is here today for follow up. The patient denies any dyspnea, palpitations, lower extremity edema, orthopnea, PND, dizziness, near syncope or syncope. He is having an increase in his arm pain which is his anginal equivalent with progressive dyspnea.     Primary Care Physician: Fleet Contras, MD  Past Medical History:  Diagnosis Date  . Acute myocardial infarction of other anterior wall, subsequent episode of care 12/12/2011  . Anal fissure   . Atherosclerosis of native artery of both lower extremities with intermittent claudication (HCC) 04/01/2018  . Bruises easily    d/t being on Effient  . CAD (coronary artery disease)    A. Acute Ant STEMI 12/12/2011   . Cardiomyopathy secondary 12/26/2011  . Chronic total occlusion of artery of the extremities (HCC) 02/26/2012  . Claudication (HCC) 12/26/2011  . Coronary atherosclerosis of native coronary artery    a. ant STEMI with cardiac arrest 2013 s/p DES to mLAD. b. [place] 10/2016 s/p PTCA to mLAD.  . Diabetes mellitus without complication (HCC)   . Essential hypertension 10/23/2016  . GERD (gastroesophageal reflux disease)    "takes tums"  . History of blood transfusion    no abnormal reaction noted  . History of kidney stones   . Hypertension   . Ischemic cardiomyopathy    a. EF 35% in 02/2012 at time of acute MI, improved to normal on subsequent imaging.  . MI (myocardial infarction) (HCC)    AMI 1/13 - complicated by VT/[place]  . Mixed  hyperlipidemia   . Numbness and tingling of right arm 03/31/2013  . Occlusion and stenosis of carotid artery without mention of cerebral infarction 08/26/2012  . PAD (peripheral artery disease) ([place])    followed by [facility] - aortobifem bypass 01/2013 & left carotid-subclavian artery bypass 04/2013  . Peripheral vascular disease, unspecified ([place]) 12/16/2012  . PVD (peripheral vascular disease) ([place]) 03/31/2013  . Rectal polyp   . Subclavian steal syndrome 08/11/2013  .  Uncontrolled diabetes mellitus ([place]) 10/23/2016    Past Surgical History:  Procedure Laterality Date  . ABDOMINAL AORTOGRAM W/LOWER EXTREMITY N/A 06/26/2017   Procedure: Abdominal Aortogram w/Lower Extremity;  Surgeon: Serafina Mitchell, MD;  Location: [place] [facility];  Service: Cardiovascular;  Laterality: N/A;  . ANAL FISSURECTOMY    . AORTA - BILATERAL FEMORAL ARTERY BYPASS GRAFT N/A 01/16/2013   Procedure: AORTA BIFEMORAL BYPASS GRAFT;  Surgeon: Serafina Mitchell, MD;  Location: [place];  Service: Vascular;  Laterality: N/A;  . APPENDECTOMY    . CARDIAC CATHETERIZATION N/A 10/23/2016   Procedure: Left Heart Cath and Coronary Angiography;  Surgeon: Burnell Blanks, MD;  Location: [place] [facility];  Service: Cardiovascular;  Laterality: N/A;  . CARDIAC CATHETERIZATION N/A 10/23/2016   Procedure: Coronary Balloon Angioplasty;  Surgeon: Burnell Blanks, MD;  Location: [place] [facility];  Service: Cardiovascular;  Laterality: N/A;  . CAROTID-SUBCLAVIAN BYPASS GRAFT Left 04/03/2013   Procedure: BYPASS GRAFT CAROTID-SUBCLAVIAN;  Surgeon: Serafina Mitchell, MD;  Location: [place];  Service: Vascular;  Laterality: Left;  . CIRCUMCISION N/A 12/15/2015   Procedure: CIRCUMCISION ADULT;  Surgeon: Cleon Gustin, MD;  Location: [facility];  Service: Urology;  Laterality: N/A;  . COLONOSCOPY    . CORONARY ANGIOPLASTY     stent placed Dec 12, 2011 and 2018  . CORONARY BALLOON ANGIOPLASTY N/A 05/23/2019   Procedure: CORONARY BALLOON ANGIOPLASTY;  Surgeon: Burnell Blanks, MD;  Location: [place] [facility];  Service: Cardiovascular;  Laterality: N/A;  . DIAGNOSTIC LAPAROSCOPY    . ENDARTERECTOMY FEMORAL Right 03/13/2018   Procedure: FEMORAL ENDARTERECTOMY RIGHT;  Surgeon: Serafina Mitchell, MD;  Location: [place];  Service: Vascular;  Laterality: Right;  . FEMORAL-POPLITEAL BYPASS GRAFT Right 03/13/2018   Procedure: FEMORAL-POPLITEAL ARTERY BYPASS WITH NON-REVERSE VEIN RIGHT;  Surgeon: Serafina Mitchell, MD;  Location: [place];  Service: Vascular;  Laterality: Right;  . GROIN DISSECTION Right 03/13/2018   Procedure: RE-DO COMMON FEMORAL ARTERY EXPOSURE;  Surgeon: Serafina Mitchell, MD;  Location: [place];  Service: Vascular;  Laterality: Right;  . I & D EXTREMITY Left 04/18/2013   Procedure: debridement of left neck lymphocele;  Surgeon: Serafina Mitchell, MD;  Location: [place];  Service: Vascular;  Laterality: Left;  I and D of left neck  . I & D EXTREMITY Right 11/21/2014   Procedure: IRRIGATION AND DEBRIDEMENT RIGHT HAND;  Surgeon: Roseanne Kaufman, MD;  Location: [place];  Service: Orthopedics;  Laterality: Right;  . LEFT HEART CATH AND CORONARY ANGIOGRAPHY N/A 05/23/2019   Procedure: LEFT HEART CATH AND CORONARY ANGIOGRAPHY;  Surgeon: Burnell Blanks, MD;  Location: [place] [facility];  Service: Cardiovascular;  Laterality: N/A;  . LEFT HEART CATHETERIZATION WITH CORONARY ANGIOGRAM N/A 12/12/2011   Procedure: LEFT HEART CATHETERIZATION WITH CORONARY ANGIOGRAM;  Surgeon: Burnell Blanks, MD;  Location: [facility];  Service: Cardiovascular;  Laterality: N/A;  . LOWER EXTREMITY ANGIOGRAM N/A 01/31/2012   Procedure: LOWER EXTREMITY ANGIOGRAM;  Surgeon: Burnell Blanks, MD;  Location: [facility];  Service: Cardiovascular;  Laterality: N/A;  . LOWER EXTREMITY ANGIOGRAPHY N/A 04/23/2018   Procedure: LOWER EXTREMITY ANGIOGRAPHY;  Surgeon: Brabham, Vance W, MD;  Location: [facility];  Service: Cardiovascular;  Laterality: N/A;  . PERCUTANEOUS CORONARY STENT INTERVENTION (PCI-S) Right 12/12/2011   Procedure: PERCUTANEOUS CORONARY STENT INTERVENTION (PCI-S);  Surgeon: Shanna Un D Lashe Oliveira, MD;  Location: [facility];  Service: Cardiovascular;  Laterality: Right;  . PERIPHERAL VASCULAR INTERVENTION Left 04/23/2018   Procedure: PERIPHERAL VASCULAR INTERVENTION;  Surgeon: Brabham, Vance W, MD;  Location: [facility];  Service: Cardiovascular;  Laterality: Left;  superficial  femoral  . REPAIR EXTENSOR TENDON Right 11/21/2014   Procedure: WITH REPAIR/RECONSTRUCTION OF EXTENSOR TENDONS AS NEEDED;  Surgeon: William Gramig, MD;  Location: [facility];  Service: Orthopedics;  Laterality: Right;    Current Outpatient Medications  Medication Sig Dispense Refill  . aspirin EC 81 MG tablet Take 1 tablet (81 mg total) by mouth daily. 90 tablet 3  . BRILINTA 90 MG TABS tablet TAKE 1 TABLET BY MOUTH 2 TIMES A DAY 60 tablet 6  . carvedilol (COREG) 6.25 MG tablet Take 2 tablets (12.5 mg total) by mouth 2 (two) times daily. 180 tablet 3  . cyclobenzaprine (FLEXERIL) 10 MG tablet Take 1 tablet (10 mg total) by mouth 2 (two) times daily as needed for muscle spasms. 30 tablet 0  . enalapril (VASOTEC) 5 MG tablet TAKE ONE TABLET BY MOUTH TWICE DAILY 60 tablet 2  . gabapentin (NEURONTIN) 300 MG capsule Take 300 mg by mouth 3 (three) times daily.    . IBU 800 MG tablet Take 800 mg by mouth every 8 (eight) hours as needed for pain.    . Insulin Glargine (LANTUS SOLOSTAR) 100 UNIT/ML Solostar Pen Inject 80 Units into the skin at bedtime.     . isosorbide mononitrate (IMDUR) 60 MG 24 hr tablet Take 1 tablet (60 mg total) by mouth every 12 (twelve) hours. 60 tablet 11  . oxyCODONE-acetaminophen (PERCOCET) 10-325 MG tablet Take 1 tablet by mouth every 4 (four) hours as needed for pain.    . pantoprazole (PROTONIX) 40 MG tablet Take 40 mg by mouth daily.    . simvastatin (ZOCOR) 80 MG tablet Take 80 mg by mouth at bedtime.    . traZODone (DESYREL) 100 MG tablet Take 100 mg by mouth at bedtime as needed for sleep.    . Vitamin D, Ergocalciferol, (DRISDOL) 1.25 MG (50000 UNIT) CAPS capsule Take 50,000 Units by mouth once a week.    . nitroGLYCERIN (NITROSTAT) 0.4 MG SL tablet Place 1 tablet (0.4 mg total) under the tongue every 5 (five) minutes as needed for chest pain. 25 tablet 3   No current facility-administered medications for this visit.    Allergies  Allergen Reactions  . Hydrocodone  Hives and Nausea And Vomiting    Social History   Socioeconomic History  . Marital status: Divorced    Spouse name: Not on file  . Number of children: Not on file  . Years of education: Not on file  . Highest education level: Not on file  Occupational History    Employer: OTHER  Tobacco Use  . Smoking status: Current Every Day Smoker    Packs/day: 0.25    Years: 25.00    Pack years: 6.25    Types: Cigarettes  . Smokeless tobacco: Never Used  . Tobacco comment: 1-2 cigs a day  Substance and Sexual Activity  . Alcohol use:   No  . Drug use: No  . Sexual activity: Yes  Other Topics Concern  . Not on file  Social History Narrative   Disabled - no longer works   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 Strain:   . Difficulty of Paying Living Expenses:   Food Insecurity:   . Worried About Programme researcher, broadcasting/film/video in the Last Year:   . Barista in the Last Year:   Transportation Needs:   . Freight forwarder (Medical):   Marland Kitchen Lack of Transportation (Non-Medical):   Physical Activity:   . Days of Exercise per Week:   . Minutes of Exercise per Session:   Stress:   . Feeling of Stress :   Social Connections:   . Frequency of Communication with Friends and Family:   . Frequency of Social Gatherings with Friends and Family:   . Attends Religious Services:   . Active Member of Clubs or Organizations:   . Attends Banker Meetings:   Marland Kitchen Marital Status:   Intimate Partner Violence:   . Fear of Current or Ex-Partner:   . Emotionally Abused:   Marland Kitchen Physically Abused:   . Sexually Abused:     Family History  Problem Relation Age of Onset  . Cancer Father        Lung  . Diabetes Mother   . COPD Mother   . Cancer Maternal Uncle        Colon    Review of Systems:  As stated in the HPI and otherwise negative.   BP 110/60   Pulse 91   Ht 5\' 9"  (1.753 m)   Wt 214 lb (97.1 kg)   SpO2 94%   BMI 31.60 kg/m   Physical Examination:  General: Well  developed, well nourished, NAD  HEENT: OP clear, mucus membranes moist  SKIN: warm, dry. No rashes. Neuro: No focal deficits  Musculoskeletal: Muscle strength 5/5 all ext  Psychiatric: Mood and affect normal  Neck: No JVD, no carotid bruits, no thyromegaly, no lymphadenopathy.  Lungs:Clear bilaterally, no wheezes, rhonci, crackles Cardiovascular: Regular rate and rhythm. No murmurs, gallops or rubs. Abdomen:Soft. Bowel sounds present. Non-tender.  Extremities: No lower extremity edema. Pulses are 2 + in the bilateral DP/PT.  EKG:  EKG is ordered today. The ekg ordered today demonstrates NSR, rate 90 bpm. Poor R wave progression. TWI diffusely unchanged  Recent Labs: 05/21/2019: BUN 12; Creatinine, Ser 1.16; Hemoglobin 15.5; Platelets 194; Potassium 4.4; Sodium 139   Lipid Panel Lipid Panel     Component Value Date/Time   CHOL 180 11/09/2017 1050   TRIG 299 (H) 11/09/2017 1050   HDL 31 (L) 11/09/2017 1050   CHOLHDL 5.8 (H) 11/09/2017 1050   CHOLHDL 7.9 12/13/2011 0607   VLDL 66 (H) 12/13/2011 0607   LDLCALC 89 11/09/2017 1050   LABVLDL 60 (H) 11/09/2017 1050     Wt Readings from Last 3 Encounters:  04/05/20 214 lb (97.1 kg)  09/11/19 218 lb (98.9 kg)  06/13/19 219 lb 12.8 oz (99.7 kg)     Other studies Reviewed: Additional studies/ records that were reviewed today include: . Review of the above records demonstrates:    Assessment and Plan:   1. CAD with unstable angina: He is having worsened arm pain which is his anginal equivalent and is now taking NTG SL 3-4 times per week. Will arrange a cardiac cath at [facility] 04/22/20.  I have reviewed the risks, indications, and alternatives to cardiac  catheterization, possible angioplasty, and stenting with the patient. Risks include but are not limited to bleeding, infection, vascular injury, stroke, myocardial infection, arrhythmia, kidney injury, radiation-related injury in the case of prolonged fluoroscopy use, emergency cardiac  surgery, and death. The patient understands the risks of serious complication is 1-2 in 1000 with diagnostic cardiac cath and 1-2% or less with angioplasty/stenting..  -Continue ASA, Brilinta, Imdur, statin and beta blocker.    2. PAD: Followed in [facility].   3. Tobacco abuse: Smoking cessation advised  4. Hyperlipidemia: Lipids followed in primary care. Continue statin  5. Ischemic cardiomyopathy: LVEF 35-45% by LV gram June 2020. Continue beta blocker and Ace-inh.   Current medicines are reviewed at length with the patient today.  The patient does not have concerns regarding medicines.  The following changes have been made:  no change  Labs/ tests ordered today include:   Orders Placed This Encounter  Procedures  . CBC  . Basic metabolic panel  . EKG 12-Lead    Disposition:   FU with me in 12  months  Signed, [name], MD 04/05/2020 4:36 PM    [facility] [address] [place], [place], [place]  [address] Phone: [phone]; Fax: [phone]

## 2020-04-05 NOTE — H&P (View-Only) (Signed)
Chief Complaint  Patient presents with  . Follow-up    CAD   History of Present Illness: 54 yo male with history of ongoing tobacco abuse, CAD, HLD, PAD, HLD, ischemic cardiomyopathy and insulin dependent diabetes mellitus who is here today for cardiac follow up. He had an anterior STEMI in January 2013 with cardiac arrest and found to have an occluded mid LAD treated with a drug eluting stent. The third OM branch was chronically occluded and the RCA had mild disease. His post MI LVEF was 35% but this was found to be normal on follow up echo. Cardiac cath in November 2017 with severe stent restenosis in the LAD, treated with a cutting balloon angioplasty. He did not keep his f/u appt in our office following his PCI. He has been non-compliant with follow up, medications over the past few years and continues to smoke. Admitted to [facility] in August 2018 with an anterior STEMI. His LAD was occluded proximal to the old stent and this was treated with a drug eluting stent. Angioplasty performed on the ostial Diagonal branch. LVEF was 40% following the MI. He had unstable angina in June 2020 and repeat cardiac cath showed severe restenosis in the mid LAD stented segment which was treated with cutting balloon angioplasty. He also had ostial stenosis of the diagonal branch (jailed by LAD stents), mild to moderate non-obstructive disease in the mid RCA and moderate LV systolic dysfunction with anteroapical hypokinesis (c/w findings on echo in 2018 at [facility] LVEF=35-45%. He was placed on DAPT w/ ASA and Brilinta and continued on statin, ? blocker, ACEi and long acting nitrate  His PAD is followed in [facility] by Dr. Myra Gianotti. In 2013 he was found to have occlusion of his distal aorta and left subclavian artery. He underwent aortobifemoral bypass in February 2014 and left carotid to subclavian bypass in May 2014.  InApril2019,he underwent right femoral to AK popliteal bypass with saphenous  vein and right femoral endarterectomy  He is here today for follow up. The patient denies any dyspnea, palpitations, lower extremity edema, orthopnea, PND, dizziness, near syncope or syncope. He is having an increase in his arm pain which is his anginal equivalent with progressive dyspnea.     Primary Care Physician: Fleet Contras, MD  Past Medical History:  Diagnosis Date  . Acute myocardial infarction of other anterior wall, subsequent episode of care 12/12/2011  . Anal fissure   . Atherosclerosis of native artery of both lower extremities with intermittent claudication (HCC) 04/01/2018  . Bruises easily    d/t being on Effient  . CAD (coronary artery disease)    A. Acute Ant STEMI 12/12/2011   . Cardiomyopathy secondary 12/26/2011  . Chronic total occlusion of artery of the extremities (HCC) 02/26/2012  . Claudication (HCC) 12/26/2011  . Coronary atherosclerosis of native coronary artery    a. ant STEMI with cardiac arrest 2013 s/p DES to mLAD. b. [place] 10/2016 s/p PTCA to mLAD.  . Diabetes mellitus without complication (HCC)   . Essential hypertension 10/23/2016  . GERD (gastroesophageal reflux disease)    "takes tums"  . History of blood transfusion    no abnormal reaction noted  . History of kidney stones   . Hypertension   . Ischemic cardiomyopathy    a. EF 35% in 02/2012 at time of acute MI, improved to normal on subsequent imaging.  . MI (myocardial infarction) (HCC)    AMI 1/13 - complicated by VT/Tosades  . Mixed  hyperlipidemia   . Numbness and tingling of right arm 03/31/2013  . Occlusion and stenosis of carotid artery without mention of cerebral infarction 08/26/2012  . PAD (peripheral artery disease) ([place])    followed by [facility] - aortobifem bypass 01/2013 & left carotid-subclavian artery bypass 04/2013  . Peripheral vascular disease, unspecified ([place]) 12/16/2012  . PVD (peripheral vascular disease) ([place]) 03/31/2013  . Rectal polyp   . Subclavian steal syndrome 08/11/2013  .  Uncontrolled diabetes mellitus ([place]) 10/23/2016    Past Surgical History:  Procedure Laterality Date  . ABDOMINAL AORTOGRAM W/LOWER EXTREMITY N/A 06/26/2017   Procedure: Abdominal Aortogram w/Lower Extremity;  Surgeon: Serafina Mitchell, MD;  Location: [place] [facility];  Service: Cardiovascular;  Laterality: N/A;  . ANAL FISSURECTOMY    . AORTA - BILATERAL FEMORAL ARTERY BYPASS GRAFT N/A 01/16/2013   Procedure: AORTA BIFEMORAL BYPASS GRAFT;  Surgeon: Serafina Mitchell, MD;  Location: [place];  Service: Vascular;  Laterality: N/A;  . APPENDECTOMY    . CARDIAC CATHETERIZATION N/A 10/23/2016   Procedure: Left Heart Cath and Coronary Angiography;  Surgeon: Burnell Blanks, MD;  Location: [place] [facility];  Service: Cardiovascular;  Laterality: N/A;  . CARDIAC CATHETERIZATION N/A 10/23/2016   Procedure: Coronary Balloon Angioplasty;  Surgeon: Burnell Blanks, MD;  Location: [place] [facility];  Service: Cardiovascular;  Laterality: N/A;  . CAROTID-SUBCLAVIAN BYPASS GRAFT Left 04/03/2013   Procedure: BYPASS GRAFT CAROTID-SUBCLAVIAN;  Surgeon: Serafina Mitchell, MD;  Location: [place];  Service: Vascular;  Laterality: Left;  . CIRCUMCISION N/A 12/15/2015   Procedure: CIRCUMCISION ADULT;  Surgeon: Cleon Gustin, MD;  Location: [facility];  Service: Urology;  Laterality: N/A;  . COLONOSCOPY    . CORONARY ANGIOPLASTY     stent placed Dec 12, 2011 and 2018  . CORONARY BALLOON ANGIOPLASTY N/A 05/23/2019   Procedure: CORONARY BALLOON ANGIOPLASTY;  Surgeon: Burnell Blanks, MD;  Location: [place] [facility];  Service: Cardiovascular;  Laterality: N/A;  . DIAGNOSTIC LAPAROSCOPY    . ENDARTERECTOMY FEMORAL Right 03/13/2018   Procedure: FEMORAL ENDARTERECTOMY RIGHT;  Surgeon: Serafina Mitchell, MD;  Location: [place];  Service: Vascular;  Laterality: Right;  . FEMORAL-POPLITEAL BYPASS GRAFT Right 03/13/2018   Procedure: FEMORAL-POPLITEAL ARTERY BYPASS WITH NON-REVERSE VEIN RIGHT;  Surgeon: Serafina Mitchell, MD;  Location: [place];  Service: Vascular;  Laterality: Right;  . GROIN DISSECTION Right 03/13/2018   Procedure: RE-DO COMMON FEMORAL ARTERY EXPOSURE;  Surgeon: Serafina Mitchell, MD;  Location: [place];  Service: Vascular;  Laterality: Right;  . I & D EXTREMITY Left 04/18/2013   Procedure: debridement of left neck lymphocele;  Surgeon: Serafina Mitchell, MD;  Location: [place];  Service: Vascular;  Laterality: Left;  I and D of left neck  . I & D EXTREMITY Right 11/21/2014   Procedure: IRRIGATION AND DEBRIDEMENT RIGHT HAND;  Surgeon: Roseanne Kaufman, MD;  Location: [place];  Service: Orthopedics;  Laterality: Right;  . LEFT HEART CATH AND CORONARY ANGIOGRAPHY N/A 05/23/2019   Procedure: LEFT HEART CATH AND CORONARY ANGIOGRAPHY;  Surgeon: Burnell Blanks, MD;  Location: [place] [facility];  Service: Cardiovascular;  Laterality: N/A;  . LEFT HEART CATHETERIZATION WITH CORONARY ANGIOGRAM N/A 12/12/2011   Procedure: LEFT HEART CATHETERIZATION WITH CORONARY ANGIOGRAM;  Surgeon: Burnell Blanks, MD;  Location: [facility] C[facility] LAB;  Service: Cardiovascular;  Laterality: N/A;  . LOWER EXTREMITY ANGIOGRAM N/A 01/31/2012   Procedure: LOWER EXTREMITY ANGIOGRAM;  Surgeon: Burnell Blanks, MD;  Location: [facility];  Service: Cardiovascular;  Laterality: N/A;  . LOWER EXTREMITY ANGIOGRAPHY N/A 04/23/2018   Procedure: LOWER EXTREMITY ANGIOGRAPHY;  Surgeon: Nada Libman, MD;  Location: [facility];  Service: Cardiovascular;  Laterality: N/A;  . PERCUTANEOUS CORONARY STENT INTERVENTION (PCI-S) Right 12/12/2011   Procedure: PERCUTANEOUS CORONARY STENT INTERVENTION (PCI-S);  Surgeon: Kathleene Hazel, MD;  Location: [facility] C[facility] LAB;  Service: Cardiovascular;  Laterality: Right;  . PERIPHERAL VASCULAR INTERVENTION Left 04/23/2018   Procedure: PERIPHERAL VASCULAR INTERVENTION;  Surgeon: Nada Libman, MD;  Location[facility];  Service: Cardiovascular;  Laterality: Left;  superficial  femoral  . REPAIR EXTENSOR TENDON Right 11/21/2014   Procedure: WITH REPAIR/RECONSTRUCTION OF EXTENSOR TENDONS AS NEEDED;  Surgeon: Dominica Severin, MD;  Location: [facility];  Service: Orthopedics;  Laterality: Right;    Current Outpatient Medications  Medication Sig Dispense Refill  . aspirin EC 81 MG tablet Take 1 tablet (81 mg total) by mouth daily. 90 tablet 3  . BRILINTA 90 MG TABS tablet TAKE 1 TABLET BY MOUTH 2 TIMES A DAY 60 tablet 6  . carvedilol (COREG) 6.25 MG tablet Take 2 tablets (12.5 mg total) by mouth 2 (two) times daily. 180 tablet 3  . cyclobenzaprine (FLEXERIL) 10 MG tablet Take 1 tablet (10 mg total) by mouth 2 (two) times daily as needed for muscle spasms. 30 tablet 0  . enalapril (VASOTEC) 5 MG tablet TAKE ONE TABLET BY MOUTH TWICE DAILY 60 tablet 2  . gabapentin (NEURONTIN) 300 MG capsule Take 300 mg by mouth 3 (three) times daily.    . IBU 800 MG tablet Take 800 mg by mouth every 8 (eight) hours as needed for pain.    . Insulin Glargine (LANTUS SOLOSTAR) 100 UNIT/ML Solostar Pen Inject 80 Units into the skin at bedtime.     . isosorbide mononitrate (IMDUR) 60 MG 24 hr tablet Take 1 tablet (60 mg total) by mouth every 12 (twelve) hours. 60 tablet 11  . oxyCODONE-acetaminophen (PERCOCET) 10-325 MG tablet Take 1 tablet by mouth every 4 (four) hours as needed for pain.    . pantoprazole (PROTONIX) 40 MG tablet Take 40 mg by mouth daily.    . simvastatin (ZOCOR) 80 MG tablet Take 80 mg by mouth at bedtime.    . traZODone (DESYREL) 100 MG tablet Take 100 mg by mouth at bedtime as needed for sleep.    . Vitamin D, Ergocalciferol, (DRISDOL) 1.25 MG (50000 UNIT) CAPS capsule Take 50,000 Units by mouth once a week.    . nitroGLYCERIN (NITROSTAT) 0.4 MG SL tablet Place 1 tablet (0.4 mg total) under the tongue every 5 (five) minutes as needed for chest pain. 25 tablet 3   No current facility-administered medications for this visit.    Allergies  Allergen Reactions  . Hydrocodone  Hives and Nausea And Vomiting    Social History   Socioeconomic History  . Marital status: Divorced    Spouse name: Not on file  . Number of children: Not on file  . Years of education: Not on file  . Highest education level: Not on file  Occupational History    Employer: OTHER  Tobacco Use  . Smoking status: Current Every Day Smoker    Packs/day: 0.25    Years: 25.00    Pack years: 6.25    Types: Cigarettes  . Smokeless tobacco: Never Used  . Tobacco comment: 1-2 cigs a day  Substance and Sexual Activity  . Alcohol use:  No  . Drug use: No  . Sexual activity: Yes  Other Topics Concern  . Not on file  Social History Narrative   Disabled - no longer works   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 Strain:   . Difficulty of Paying Living Expenses:   Food Insecurity:   . Worried About Programme researcher, broadcasting/film/video in the Last Year:   . Barista in the Last Year:   Transportation Needs:   . Freight forwarder (Medical):   Marland Kitchen Lack of Transportation (Non-Medical):   Physical Activity:   . Days of Exercise per Week:   . Minutes of Exercise per Session:   Stress:   . Feeling of Stress :   Social Connections:   . Frequency of Communication with Friends and Family:   . Frequency of Social Gatherings with Friends and Family:   . Attends Religious Services:   . Active Member of Clubs or Organizations:   . Attends Banker Meetings:   Marland Kitchen Marital Status:   Intimate Partner Violence:   . Fear of Current or Ex-Partner:   . Emotionally Abused:   Marland Kitchen Physically Abused:   . Sexually Abused:     Family History  Problem Relation Age of Onset  . Cancer Father        Lung  . Diabetes Mother   . COPD Mother   . Cancer Maternal Uncle        Colon    Review of Systems:  As stated in the HPI and otherwise negative.   BP 110/60   Pulse 91   Ht 5\' 9"  (1.753 m)   Wt 214 lb (97.1 kg)   SpO2 94%   BMI 31.60 kg/m   Physical Examination:  General: Well  developed, well nourished, NAD  HEENT: OP clear, mucus membranes moist  SKIN: warm, dry. No rashes. Neuro: No focal deficits  Musculoskeletal: Muscle strength 5/5 all ext  Psychiatric: Mood and affect normal  Neck: No JVD, no carotid bruits, no thyromegaly, no lymphadenopathy.  Lungs:Clear bilaterally, no wheezes, rhonci, crackles Cardiovascular: Regular rate and rhythm. No murmurs, gallops or rubs. Abdomen:Soft. Bowel sounds present. Non-tender.  Extremities: No lower extremity edema. Pulses are 2 + in the bilateral DP/PT.  EKG:  EKG is ordered today. The ekg ordered today demonstrates NSR, rate 90 bpm. Poor R wave progression. TWI diffusely unchanged  Recent Labs: 05/21/2019: BUN 12; Creatinine, Ser 1.16; Hemoglobin 15.5; Platelets 194; Potassium 4.4; Sodium 139   Lipid Panel Lipid Panel     Component Value Date/Time   CHOL 180 11/09/2017 1050   TRIG 299 (H) 11/09/2017 1050   HDL 31 (L) 11/09/2017 1050   CHOLHDL 5.8 (H) 11/09/2017 1050   CHOLHDL 7.9 12/13/2011 0607   VLDL 66 (H) 12/13/2011 0607   LDLCALC 89 11/09/2017 1050   LABVLDL 60 (H) 11/09/2017 1050     Wt Readings from Last 3 Encounters:  04/05/20 214 lb (97.1 kg)  09/11/19 218 lb (98.9 kg)  06/13/19 219 lb 12.8 oz (99.7 kg)     Other studies Reviewed: Additional studies/ records that were reviewed today include: . Review of the above records demonstrates:    Assessment and Plan:   1. CAD with unstable angina: He is having worsened arm pain which is his anginal equivalent and is now taking NTG SL 3-4 times per week. Will arrange a cardiac cath at [facility] 04/22/20.  I have reviewed the risks, indications, and alternatives to cardiac  catheterization, possible angioplasty, and stenting with the patient. Risks include but are not limited to bleeding, infection, vascular injury, stroke, myocardial infection, arrhythmia, kidney injury, radiation-related injury in the case of prolonged fluoroscopy use, emergency cardiac  surgery, and death. The patient understands the risks of serious complication is 1-2 in 1000 with diagnostic cardiac cath and 1-2% or less with angioplasty/stenting..  -Continue ASA, Brilinta, Imdur, statin and beta blocker.    2. PAD: Followed in [facility].   3. Tobacco abuse: Smoking cessation advised  4. Hyperlipidemia: Lipids followed in primary care. Continue statin  5. Ischemic cardiomyopathy: LVEF 35-45% by LV gram June 2020. Continue beta blocker and Ace-inh.   Current medicines are reviewed at length with the patient today.  The patient does not have concerns regarding medicines.  The following changes have been made:  no change  Labs/ tests ordered today include:   Orders Placed This Encounter  Procedures  . CBC  . Basic metabolic panel  . EKG 12-Lead    Disposition:   FU with me in 12  months  Signed, [name], MD 04/05/2020 4:36 PM    [facility] [address] [place], [place], [place]  [address] Phone: [phone]; Fax: [phone]

## 2020-04-05 NOTE — Patient Instructions (Signed)
Medication Instructions:  No changes *If you need a refill on your cardiac medications before your next appointment, please call your pharmacy*   Lab Work: Today: bmet, cbc If you have labs (blood work) drawn today and your tests are completely normal, you will receive your results only by: Marland Kitchen MyChart Message (if you have MyChart) OR . A paper copy in the mail If you have any lab test that is abnormal or we need to change your treatment, we will call you to review the results.   Testing/Procedures: Your physician has requested that you have a cardiac catheterization. Cardiac catheterization is used to diagnose and/or treat various heart conditions. Doctors may recommend this procedure for a number of different reasons. The most common reason is to evaluate chest pain. Chest pain can be a symptom of coronary artery disease (CAD), and cardiac catheterization can show whether plaque is narrowing or blocking your heart's arteries. This procedure is also used to evaluate the valves, as well as measure the blood flow and oxygen levels in different parts of your heart. For further information please visit [email or web address] Please follow instruction sheet, as given.   Follow-Up: At [facility], you and your health needs are our priority.  As part of our continuing mission to provide you with exceptional heart care, we have created designated Provider Care Teams.  These Care Teams include your primary Cardiologist (physician) and Advanced Practice Providers (APPs -  Physician Assistants and Nurse Practitioners) who all work together to provide you with the care you need, when you need it.  Your next appointment:   4 week(s)  The format for your next appointment:   In Person  Provider:   You may see [name], MD or one of the following Advanced Practice Providers on your designated Care Team:    Ronie Spies, PA-C  Jacolyn Reedy, PA-C    Other Instructions

## 2020-04-06 LAB — BASIC METABOLIC PANEL
BUN/Creatinine Ratio: 12 (ref 9–20)
BUN: 15 mg/dL (ref 6–24)
CO2: 23 mmol/L (ref 20–29)
Calcium: 9.4 mg/dL (ref 8.7–10.2)
Chloride: 96 mmol/L (ref 96–106)
Creatinine, Ser: 1.23 mg/dL (ref 0.76–1.27)
GFR calc Af Amer: 77 mL/min/{1.73_m2} (ref >59)
GFR calc non Af Amer: 67 mL/min/{1.73_m2} (ref >59)
Glucose: 368 mg/dL — ABNORMAL HIGH (ref 65–99)
Potassium: 4.4 mmol/L (ref 3.5–5.2)
Sodium: 135 mmol/L (ref 134–144)

## 2020-04-06 LAB — CBC
Hematocrit: 48.9 % (ref 37.5–51.0)
Hemoglobin: 16.2 g/dL (ref 13.0–17.7)
MCH: 31.7 pg (ref 26.6–33.0)
MCHC: 33.1 g/dL (ref 31.5–35.7)
MCV: 96 fL (ref 79–97)
Platelets: 186 10*3/uL (ref 150–450)
RBC: 5.11 x10E6/uL (ref 4.14–5.80)
RDW: 12.6 % (ref 11.6–15.4)
WBC: 8.7 10*3/uL (ref 3.4–10.8)

## 2020-04-20 ENCOUNTER — Other Ambulatory Visit (HOSPITAL_COMMUNITY)
Admission: RE | Admit: 2020-04-20 | Discharge: 2020-04-20 | Disposition: A | Discharge status: Home / Self Care | Payer: Medicaid Other | Source: Ambulatory Visit | Attending: Cardiovascular Disease | Admitting: Cardiovascular Disease

## 2020-04-20 ENCOUNTER — Telehealth: Payer: Self-pay | Admitting: *Deleted

## 2020-04-20 DIAGNOSIS — Z01812 Encounter for preprocedural laboratory examination: Secondary | ICD-10-CM | POA: Insufficient documentation

## 2020-04-20 DIAGNOSIS — Z20822 Contact with and (suspected) exposure to covid-19: Secondary | ICD-10-CM | POA: Diagnosis not present

## 2020-04-20 LAB — SARS CORONAVIRUS 2 (TAT 6-24 HRS): SARS Coronavirus 2: NEGATIVE

## 2020-04-20 NOTE — Telephone Encounter (Addendum)
Pt contacted pre-catheterization scheduled at [facility] for: Thursday Apr 22, 2020 7:30 AM Verified arrival time and place: [facility] Main Entrance A [facility]) at: 5:30 AM   No solid food after midnight prior to cath, clear liquids until 5 AM day of procedure.  Hold: Insulin-1/2 usual dose HS prior to procedure  AM meds can be  taken pre-cath with sip of water including: ASA 81 mg Brilinta 90 mg  Confirmed patient has responsible adult to drive home post procedure and observe 24 hours after arriving home: yes  You are allowed ONE visitor in the waiting room during your procedure. Both you and your visitor must wear masks.      COVID-19 Pre-Screening Questions:  . In the past 7 to 10 days have you had a cough,  shortness of breath, headache, congestion, fever (100 or greater) body aches, chills, sore throat, or sudden loss of taste or sense of smell? no . Have you been around anyone with known Covid 19 in the past 7 to 10 days? no . Have you been around anyone who is awaiting Covid 19 test results in the past 7 to 10 days? no . Have you been around anyone who has mentioned symptoms of Covid 19 within the past 7 to 10 days? no   Reviewed procedure/mask/visitor instructions, COVID-19 questions with patient

## 2020-04-21 NOTE — Progress Notes (Deleted)
Cardiology Office Note    Date:  04/21/2020   ID:  Ryan [name], DOB [birth year], MRN [id number]  PCP:  Fleet Contras, MD  Cardiologist: [name], MD EPS: None  No chief complaint on file.   History of Present Illness:  Ryan [name] is a 54 y.o. male with history of CAD status post anterior STEMI 2013 with cardiac arrest treated with DES to the LAD.  Third OM branch was chronically occluded and RCA had mild disease.  Ejection fraction 35% but normalized on follow-up echo.  Cardiac cath 10/2016 severe in-stent restenosis of the LAD treated with cutting balloon angioplasty.  Was not compliant with medications continue to smoke and did not follow-up with [place].  He had an anterior STEMI 07/2017 admitted to [facility] and his LAD was occluded proximal to the old stent treated with a DES.  Angioplasty was performed on ostial diagonal branch and LVEF was 40%.   He had unstable angina in June 2020 and repeat cardiac cath showed severe restenosis in the mid LAD stented segment which was treated with cutting balloon angioplasty. He also had ostial stenosis of the diagonal branch (jailed by LAD stents), mild to moderate non-obstructive disease in the mid RCA and moderate LV systolic dysfunction with anteroapical hypokinesis (c/w findings on echo in 2018 at [facility]). LVEF=35-45%. He was placed on DAPT w/ ASA and Brilinta and continued on statin, ? blocker, ACEi and long acting nitrate   Has history of aorto bifemoral bypass graft as well as right femoropopliteal bypass grafting carotid subclavian bypass.   had worsening symptoms of claudication and patient underwent stenting to the left superficial femoral artery 04/23/2018.  Patient saw Dr. [name] 04/05/2020 with worsening arm pain that is his anginal equivalent and was taking nitroglycerin 3-4 times per week.  Cardiac cath 04/22/2020     Past Medical History:  Diagnosis Date  . Acute myocardial infarction of other  anterior wall, subsequent episode of care 12/12/2011  . Anal fissure   . Atherosclerosis of native artery of both lower extremities with intermittent claudication (HCC) 04/01/2018  . Bruises easily    d/t being on Effient  . CAD (coronary artery disease)    A. Acute Ant STEMI 12/12/2011   . Cardiomyopathy secondary 12/26/2011  . Chronic total occlusion of artery of the extremities (HCC) 02/26/2012  . Claudication (HCC) 12/26/2011  . Coronary atherosclerosis of native coronary artery    a. ant STEMI with cardiac arrest 2013 s/p DES to mLAD. b. [place] 10/2016 s/p PTCA to mLAD.  . Diabetes mellitus without complication (HCC)   . Essential hypertension 10/23/2016  . GERD (gastroesophageal reflux disease)    "takes tums"  . History of blood transfusion    no abnormal reaction noted  . History of kidney stones   . Hypertension   . Ischemic cardiomyopathy    a. EF 35% in 02/2012 at time of acute MI, improved to normal on subsequent imaging.  . MI (myocardial infarction) (HCC)    AMI 1/13 - complicated by VT/[place]  . Mixed hyperlipidemia   . Numbness and tingling of right arm 03/31/2013  . Occlusion and stenosis of carotid artery without mention of cerebral infarction 08/26/2012  . PAD (peripheral artery disease) (HCC)    followed by Vascular - aortobifem bypass 01/2013 & left carotid-subclavian artery bypass 04/2013  . Peripheral vascular disease, unspecified (HCC) 12/16/2012  . PVD (peripheral vascular disease) (HCC) 03/31/2013  . Rectal polyp   . Subclavian steal syndrome  08/11/2013  . Uncontrolled diabetes mellitus ([place]) 10/23/2016    Past Surgical History:  Procedure Laterality Date  . ABDOMINAL AORTOGRAM W/LOWER EXTREMITY N/A 06/26/2017   Procedure: Abdominal Aortogram w/Lower Extremity;  Surgeon: Serafina Mitchell, MD;  Location: [place] [facility];  Service: Cardiovascular;  Laterality: N/A;  . ANAL FISSURECTOMY    . AORTA - BILATERAL FEMORAL ARTERY BYPASS GRAFT N/A 01/16/2013   Procedure: AORTA  BIFEMORAL BYPASS GRAFT;  Surgeon: Serafina Mitchell, MD;  Location: [place];  Service: Vascular;  Laterality: N/A;  . APPENDECTOMY    . CARDIAC CATHETERIZATION N/A 10/23/2016   Procedure: Left Heart Cath and Coronary Angiography;  Surgeon: Burnell Blanks, MD;  Location: [place] [facility];  Service: Cardiovascular;  Laterality: N/A;  . CARDIAC CATHETERIZATION N/A 10/23/2016   Procedure: Coronary Balloon Angioplasty;  Surgeon: Burnell Blanks, MD;  Location: [place] [facility];  Service: Cardiovascular;  Laterality: N/A;  . CAROTID-SUBCLAVIAN BYPASS GRAFT Left 04/03/2013   Procedure: BYPASS GRAFT CAROTID-SUBCLAVIAN;  Surgeon: Serafina Mitchell, MD;  Location: [place];  Service: Vascular;  Laterality: Left;  . CIRCUMCISION N/A 12/15/2015   Procedure: CIRCUMCISION ADULT;  Surgeon: Cleon Gustin, MD;  Location: [facility];  Service: Urology;  Laterality: N/A;  . COLONOSCOPY    . CORONARY ANGIOPLASTY     stent placed Dec 12, 2011 and 2018  . CORONARY BALLOON ANGIOPLASTY N/A 05/23/2019   Procedure: CORONARY BALLOON ANGIOPLASTY;  Surgeon: Burnell Blanks, MD;  Location: [place] [facility];  Service: Cardiovascular;  Laterality: N/A;  . DIAGNOSTIC LAPAROSCOPY    . ENDARTERECTOMY FEMORAL Right 03/13/2018   Procedure: FEMORAL ENDARTERECTOMY RIGHT;  Surgeon: Serafina Mitchell, MD;  Location: [place];  Service: Vascular;  Laterality: Right;  . FEMORAL-POPLITEAL BYPASS GRAFT Right 03/13/2018   Procedure: FEMORAL-POPLITEAL ARTERY BYPASS WITH NON-REVERSE VEIN RIGHT;  Surgeon: Serafina Mitchell, MD;  Location: [place];  Service: Vascular;  Laterality: Right;  . GROIN DISSECTION Right 03/13/2018   Procedure: RE-DO COMMON FEMORAL ARTERY EXPOSURE;  Surgeon: Serafina Mitchell, MD;  Location: [place];  Service: Vascular;  Laterality: Right;  . I & D EXTREMITY Left 04/18/2013   Procedure: debridement of left neck lymphocele;  Surgeon: Serafina Mitchell, MD;  Location: [place];  Service: Vascular;  Laterality: Left;  I  and D of left neck  . I & D EXTREMITY Right 11/21/2014   Procedure: IRRIGATION AND DEBRIDEMENT RIGHT HAND;  Surgeon: Roseanne Kaufman, MD;  Location: [place];  Service: Orthopedics;  Laterality: Right;  . LEFT HEART CATH AND CORONARY ANGIOGRAPHY N/A 05/23/2019   Procedure: LEFT HEART CATH AND CORONARY ANGIOGRAPHY;  Surgeon: Burnell Blanks, MD;  Location: [place] [facility];  Service: Cardiovascular;  Laterality: N/A;  . LEFT HEART CATHETERIZATION WITH CORONARY ANGIOGRAM N/A 12/12/2011   Procedure: LEFT HEART CATHETERIZATION WITH CORONARY ANGIOGRAM;  Surgeon: Burnell Blanks, MD;  Location[facility];  Service: Cardiovascular;  Laterality: N/A;  . LOWER EXTREMITY ANGIOGRAM N/A 01/31/2012   Procedure: LOWER EXTREMITY ANGIOGRAM;  Surgeon: Burnell Blanks, MD;  Location: [facility] C[facility] LAB;  Service: Cardiovascular;  Laterality: N/A;  . LOWER EXTREMITY ANGIOGRAPHY N/A 04/23/2018   Procedure: LOWER EXTREMITY ANGIOGRAPHY;  Surgeon: Serafina Mitchell, MD;  Location: [place] [facility];  Service: Cardiovascular;  Laterality: N/A;  . PERCUTANEOUS CORONARY STENT INTERVENTION (PCI-S) Right 12/12/2011   Procedure: PERCUTANEOUS CORONARY STENT INTERVENTION (PCI-S);  Surgeon: Burnell Blanks, MD;  Location: [facility] CATH LAB;  Service: Cardiovascular;  Laterality: Right;  . PERIPHERAL VASCULAR INTERVENTION Left 04/23/2018   Procedure: PERIPHERAL VASCULAR INTERVENTION;  Surgeon: Nada Libman, MD;  Location: [facility];  Service: Cardiovascular;  Laterality: Left;  superficial femoral  . REPAIR EXTENSOR TENDON Right 11/21/2014   Procedure: WITH REPAIR/RECONSTRUCTION OF EXTENSOR TENDONS AS NEEDED;  Surgeon: Dominica Severin, MD;  Location: [facility];  Service: Orthopedics;  Laterality: Right;    Current Medications: No outpatient medications have been marked as taking for the 04/28/20 encounter (Appointment) with Dyann Kief, PA-C.     Allergies:   Hydrocodone   Social History    Socioeconomic History  . Marital status: Divorced    Spouse name: Not on file  . Number of children: Not on file  . Years of education: Not on file  . Highest education level: Not on file  Occupational History    Employer: OTHER  Tobacco Use  . Smoking status: Current Every Day Smoker    Packs/day: 0.25    Years: 25.00    Pack years: 6.25    Types: Cigarettes  . Smokeless tobacco: Never Used  . Tobacco comment: 1-2 cigs a day  Substance and Sexual Activity  . Alcohol use: No  . Drug use: No  . Sexual activity: Yes  Other Topics Concern  . Not on file  Social History Narrative   Disabled - no longer works   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 Strain:   . Difficulty of Paying Living Expenses:   Food Insecurity:   . Worried About Programme researcher, broadcasting/film/video in the Last Year:   . Barista in the Last Year:   Transportation Needs:   . Freight forwarder (Medical):   Marland Kitchen Lack of Transportation (Non-Medical):   Physical Activity:   . Days of Exercise per Week:   . Minutes of Exercise per Session:   Stress:   . Feeling of Stress :   Social Connections:   . Frequency of Communication with Friends and Family:   . Frequency of Social Gatherings with Friends and Family:   . Attends Religious Services:   . Active Member of Clubs or Organizations:   . Attends Banker Meetings:   Marland Kitchen Marital Status:      Family History:  The patient's ***family history includes COPD in his mother; Cancer in his father and maternal uncle; Diabetes in his mother.   ROS:   Please see the history of present illness.    ROS All other systems reviewed and are negative.   PHYSICAL EXAM:   VS:  There were no vitals taken for this visit.  Physical Exam  GEN: Well nourished, well developed, in no acute distress  HEENT: normal  Neck: no JVD, carotid bruits, or masses Cardiac:RRR; no murmurs, rubs, or gallops  Respiratory:  clear to auscultation bilaterally, normal  work of breathing GI: soft, nontender, nondistended, + BS Ext: without cyanosis, clubbing, or edema, Good distal pulses bilaterally MS: no deformity or atrophy  Skin: warm and dry, no rash Neuro:  Alert and Oriented x 3, Strength and sensation are intact Psych: euthymic mood, full affect  Wt Readings from Last 3 Encounters:  04/05/20 214 lb (97.1 kg)  09/11/19 218 lb (98.9 kg)  06/13/19 219 lb 12.8 oz (99.7 kg)      Studies/Labs Reviewed:   EKG:  EKG is*** ordered today.  The ekg ordered today demonstrates ***  Recent Labs: 04/05/2020: BUN 15; Creatinine, Ser 1.23; Hemoglobin  16.2; Platelets 186; Potassium 4.4; Sodium 135   Lipid Panel    Component Value Date/Time   CHOL 180 11/09/2017 1050   TRIG 299 (H) 11/09/2017 1050   HDL 31 (L) 11/09/2017 1050   CHOLHDL 5.8 (H) 11/09/2017 1050   CHOLHDL 7.9 12/13/2011 0607   VLDL 66 (H) 12/13/2011 0607   LDLCALC 89 11/09/2017 1050    Additional studies/ records that were reviewed today include:  Cardiac cath 04/22/2020       Cardiac cath 05/23/2019  Ost 3rd Mrg to 3rd Mrg lesion is 100% stenosed.  Mid RCA lesion is 40% stenosed.  Prox LAD to Mid LAD lesion is 80% stenosed.  Scoring balloon angioplasty was performed using a BALLOON WOLVERINE 3.00X10.  Post intervention, there is a 10% residual stenosis.  There is mild to moderate left ventricular systolic dysfunction.  The left ventricular ejection fraction is 35-45% by visual estimate.  There is no mitral valve regurgitation.   1. Severe restenosis in the mid LAD stented segment. 2. Successful scoring balloon angioplasty mid LAD stented segment 3. Ostial stenosis Diagonal branch (jailed by LAD stents) 4. Mild to moderate non-obstructive disease in the mid RCA 5. Moderate LV systolic dysfunction with anteroapical hypokinesis (c/w findings on echo in 2018 at [facility]). LVEF=35-45%.    Recommendations: Continue ASA and Brilinta indefinitely. He had been on  Brilinta 60 mg twice daily at home. Will increase to Brilinta 90 mg po BID. Continue other medical therapy. Same day PCI discharge.        ASSESSMENT:    No diagnosis found.   PLAN:  In order of problems listed above:  CAD as listed above.  Most recent cath 04/22/2020  Ischemic cardiomyopathy ejection fraction 35 to 45% on cath 2020 and echo at [facility] 2018 on beta-blocker and ACE inhibitor  Hyperlipidemia followed by PCP on statin  PAD status post aortobifem bypass graft and right femoropopliteal bypass graft, carotid subclavian bypass, stenting of the left superficial femoral artery 04/23/2018.  Followed by [facility]  Tobacco abuse    Medication Adjustments/Labs and Tests Ordered: Current medicines are reviewed at length with the patient today.  Concerns regarding medicines are outlined above.  Medication changes, Labs and Tests ordered today are listed in the Patient Instructions below. There are no Patient Instructions on file for this visit.   Elson Clan, PA-C  04/21/2020 10:59 AM    [facility] [address] [place], [place], [place]  [address] Phone: [phone]; Fax: [phone]

## 2020-04-22 ENCOUNTER — Encounter (HOSPITAL_COMMUNITY)
Admission: RE | Disposition: A | Discharge status: Home / Self Care | Payer: Self-pay | Source: Home / Self Care | Attending: Cardiovascular Disease

## 2020-04-22 ENCOUNTER — Other Ambulatory Visit: Payer: Self-pay

## 2020-04-22 ENCOUNTER — Ambulatory Visit (HOSPITAL_COMMUNITY)
Admission: RE | Admit: 2020-04-22 | Discharge: 2020-04-22 | Disposition: A | Discharge status: Home / Self Care | Payer: Medicaid Other | Attending: Cardiovascular Disease | Admitting: Cardiovascular Disease

## 2020-04-22 DIAGNOSIS — I25110 Atherosclerotic heart disease of native coronary artery with unstable angina pectoris: Secondary | ICD-10-CM | POA: Insufficient documentation

## 2020-04-22 DIAGNOSIS — Z794 Long term (current) use of insulin: Secondary | ICD-10-CM | POA: Insufficient documentation

## 2020-04-22 DIAGNOSIS — I10 Essential (primary) hypertension: Secondary | ICD-10-CM | POA: Insufficient documentation

## 2020-04-22 DIAGNOSIS — F17210 Nicotine dependence, cigarettes, uncomplicated: Secondary | ICD-10-CM | POA: Insufficient documentation

## 2020-04-22 DIAGNOSIS — E782 Mixed hyperlipidemia: Secondary | ICD-10-CM | POA: Insufficient documentation

## 2020-04-22 DIAGNOSIS — I255 Ischemic cardiomyopathy: Secondary | ICD-10-CM | POA: Insufficient documentation

## 2020-04-22 DIAGNOSIS — E1151 Type 2 diabetes mellitus with diabetic peripheral angiopathy without gangrene: Secondary | ICD-10-CM | POA: Insufficient documentation

## 2020-04-22 DIAGNOSIS — Z7982 Long term (current) use of aspirin: Secondary | ICD-10-CM | POA: Diagnosis not present

## 2020-04-22 DIAGNOSIS — Z79899 Other long term (current) drug therapy: Secondary | ICD-10-CM | POA: Insufficient documentation

## 2020-04-22 DIAGNOSIS — I252 Old myocardial infarction: Secondary | ICD-10-CM | POA: Insufficient documentation

## 2020-04-22 DIAGNOSIS — K219 Gastro-esophageal reflux disease without esophagitis: Secondary | ICD-10-CM | POA: Insufficient documentation

## 2020-04-22 HISTORY — PX: CORONARY PRESSURE/FFR STUDY: CATH118243

## 2020-04-22 HISTORY — PX: LEFT HEART CATH AND CORONARY ANGIOGRAPHY: CATH118249

## 2020-04-22 HISTORY — PX: INTRAVASCULAR PRESSURE WIRE/FFR STUDY: CATH118243

## 2020-04-22 LAB — GLUCOSE, CAPILLARY
Glucose-Capillary: 516 mg/dL (ref 70–99)
Glucose-Capillary: 323 mg/dL — ABNORMAL HIGH (ref 70–99)

## 2020-04-22 LAB — POCT ACTIVATED CLOTTING TIME: Activated Clotting Time: 268 seconds

## 2020-04-22 SURGERY — LEFT HEART CATH AND CORONARY ANGIOGRAPHY
Anesthesia: LOCAL

## 2020-04-22 MED ORDER — MIDAZOLAM HCL 2 MG/2ML IJ SOLN
INTRAMUSCULAR | Status: AC
  Filled 2020-04-22: qty 2

## 2020-04-22 MED ORDER — SODIUM CHLORIDE 0.9% FLUSH: 3.0000 mL | Freq: Two times a day (BID) | INTRAVENOUS | Status: DC

## 2020-04-22 MED ORDER — ONDANSETRON HCL 4 MG/2ML IJ SOLN: 4.0000 mg | Freq: Four times a day (QID) | INTRAMUSCULAR | Status: DC | PRN

## 2020-04-22 MED ORDER — HEPARIN (PORCINE) IN NACL 1000-0.9 UT/500ML-% IV SOLN
INTRAVENOUS | Status: DC | PRN
  Administered 2020-04-22 (×2): 500 mL

## 2020-04-22 MED ORDER — MIDAZOLAM HCL 2 MG/2ML IJ SOLN
INTRAMUSCULAR | Status: DC | PRN
  Administered 2020-04-22: 2 mg via INTRAVENOUS
  Administered 2020-04-22: 1 mg via INTRAVENOUS

## 2020-04-22 MED ORDER — SODIUM CHLORIDE 0.9% FLUSH: 3.0000 mL | INTRAVENOUS | Status: DC | PRN

## 2020-04-22 MED ORDER — ASPIRIN 81 MG PO CHEW: 81.0000 mg | CHEWABLE_TABLET | ORAL | Status: DC

## 2020-04-22 MED ORDER — HEPARIN (PORCINE) IN NACL 1000-0.9 UT/500ML-% IV SOLN
INTRAVENOUS | Status: AC
  Filled 2020-04-22: qty 1000

## 2020-04-22 MED ORDER — SODIUM CHLORIDE 0.9 % WEIGHT BASED INFUSION
3.0000 mL/kg/h | INTRAVENOUS | Status: AC
  Administered 2020-04-22: 3 mL/kg/h via INTRAVENOUS

## 2020-04-22 MED ORDER — HEPARIN SODIUM (PORCINE) 1000 UNIT/ML IJ SOLN
INTRAMUSCULAR | Status: DC | PRN
  Administered 2020-04-22 (×2): 5000 [IU] via INTRAVENOUS

## 2020-04-22 MED ORDER — SODIUM CHLORIDE 0.9 % IV SOLN: 250.0000 mL | INTRAVENOUS | Status: DC | PRN

## 2020-04-22 MED ORDER — VERAPAMIL HCL 2.5 MG/ML IV SOLN
INTRAVENOUS | Status: AC
  Filled 2020-04-22: qty 2

## 2020-04-22 MED ORDER — VERAPAMIL HCL 2.5 MG/ML IV SOLN
INTRAVENOUS | Status: DC | PRN
  Administered 2020-04-22: 10 mL via INTRA_ARTERIAL

## 2020-04-22 MED ORDER — HYDRALAZINE HCL 20 MG/ML IJ SOLN: 10.0000 mg | INTRAMUSCULAR | Status: DC | PRN

## 2020-04-22 MED ORDER — SODIUM CHLORIDE 0.9 % WEIGHT BASED INFUSION: 1.0000 mL/kg/h | INTRAVENOUS | Status: DC

## 2020-04-22 MED ORDER — HEPARIN SODIUM (PORCINE) 1000 UNIT/ML IJ SOLN
INTRAMUSCULAR | Status: AC
  Filled 2020-04-22: qty 1

## 2020-04-22 MED ORDER — FENTANYL CITRATE (PF) 100 MCG/2ML IJ SOLN
INTRAMUSCULAR | Status: AC
  Filled 2020-04-22: qty 2

## 2020-04-22 MED ORDER — LABETALOL HCL 5 MG/ML IV SOLN: 10.0000 mg | INTRAVENOUS | Status: DC | PRN

## 2020-04-22 MED ORDER — INSULIN ASPART 100 UNIT/ML ~~LOC~~ SOLN
15.0000 [IU] | Freq: Once | SUBCUTANEOUS | Status: AC
  Administered 2020-04-22: 15 [IU] via SUBCUTANEOUS

## 2020-04-22 MED ORDER — FENTANYL CITRATE (PF) 100 MCG/2ML IJ SOLN
INTRAMUSCULAR | Status: DC | PRN
  Administered 2020-04-22: 25 ug via INTRAVENOUS
  Administered 2020-04-22: 50 ug via INTRAVENOUS

## 2020-04-22 MED ORDER — LIDOCAINE HCL (PF) 1 % IJ SOLN
INTRAMUSCULAR | Status: AC
  Filled 2020-04-22: qty 30

## 2020-04-22 MED ORDER — SODIUM CHLORIDE 0.9 % IV SOLN: INTRAVENOUS | Status: AC

## 2020-04-22 MED ORDER — ACETAMINOPHEN 325 MG PO TABS: 650.0000 mg | ORAL_TABLET | ORAL | Status: DC | PRN

## 2020-04-22 MED ORDER — INSULIN ASPART 100 UNIT/ML ~~LOC~~ SOLN
0.0000 [IU] | Freq: Three times a day (TID) | SUBCUTANEOUS | Status: DC
  Filled 2020-04-22: qty 1
  Filled 2020-04-22: qty 0.15

## 2020-04-22 MED ORDER — LIDOCAINE HCL (PF) 1 % IJ SOLN
INTRAMUSCULAR | Status: DC | PRN
  Administered 2020-04-22: 3 mL

## 2020-04-22 MED ORDER — IOHEXOL 350 MG/ML SOLN
INTRAVENOUS | Status: DC | PRN
  Administered 2020-04-22: 95 mL

## 2020-04-22 SURGICAL SUPPLY — 13 items
CATH 5FR JL3.5 JR4 ANG PIG MP (CATHETERS) ×2 IMPLANT
CATH VISTA GUIDE 6FR XBLAD3.5 (CATHETERS) ×2 IMPLANT
DEVICE RAD COMP TR BAND LRG (VASCULAR PRODUCTS) ×2 IMPLANT
GLIDESHEATH SLEND SS 6F .021 (SHEATH) ×2 IMPLANT
GUIDEWIRE INQWIRE 1.5J.035X260 (WIRE) ×1 IMPLANT
GUIDEWIRE PRESSURE X 175 (WIRE) ×2 IMPLANT
INQWIRE 1.5J .035X260CM (WIRE) ×2
KIT ESSENTIALS PG (KITS) ×2 IMPLANT
KIT HEART LEFT (KITS) ×2 IMPLANT
PACK CARDIAC CATHETERIZATION (CUSTOM PROCEDURE TRAY) ×2 IMPLANT
SYR MEDRAD MARK 7 150ML (SYRINGE) ×2 IMPLANT
TRANSDUCER W/STOPCOCK (MISCELLANEOUS) ×2 IMPLANT
TUBING CIL FLEX 10 FLL-RA (TUBING) ×2 IMPLANT

## 2020-04-22 NOTE — Discharge Instructions (Signed)
DRINK PLENTY OF FLUIDS FOR THE NEXT 2-3 DAYS.  KEEP ARM ELEVATED THE REMAINDER OF THE DAY.  Radial Site Care  This sheet gives you information about how to care for yourself after your procedure. Your health care provider may also give you more specific instructions. If you have problems or questions, contact your health care provider. What can I expect after the procedure? After the procedure, it is common to have:  Bruising and tenderness at the catheter insertion area. Follow these instructions at home: Medicines  Take over-the-counter and prescription medicines only as told by your health care provider. Insertion site care 1. Follow instructions from your health care provider about how to take care of your insertion site. Make sure you: ? Wash your hands with soap and water before you change your bandage (dressing). If soap and water are not available, use hand sanitizer. ? Change your dressing as told by your health care provider. 2. Check your insertion site every day for signs of infection. Check for: ? Redness, swelling, or pain. ? Fluid or blood. ? Pus or a bad smell. ? Warmth. 3. Do not take baths, swim, or use a hot tub for 5 days. 4. You may shower 24-48 hours after the procedure. ? Remove the dressing and gently wash the site with plain soap and water. ? Pat the area dry with a clean towel. ? Do not rub the site. That could cause bleeding. 5. Do not apply powder or lotion to the site. Activity  1. For 24 hours after the procedure, or as directed by your health care provider: ? Do not flex or bend the affected arm. ? Do not push or pull heavy objects with the affected arm. ? Do not drive yourself home from the hospital or clinic. You may drive 24 hours after the procedure. ? Do not operate machinery or power tools. 2. Do not push, pull or lift anything that is heavier than 10 lb for 5 days. 3. Ask your health care provider when it is okay to: ? Return to work or  school. ? Resume usual physical activities or sports. ? Resume sexual activity. General instructions  If the catheter site starts to bleed, raise your arm and put firm pressure on the site. If the bleeding does not stop, get help right away. This is a medical emergency.  If you went home on the same day as your procedure, a responsible adult should be with you for the first 24 hours after you arrive home.  Keep all follow-up visits as told by your health care provider. This is important. Contact a health care provider if:  You have a fever.  You have redness, swelling, or yellow drainage around your insertion site. Get help right away if:  You have unusual pain at the radial site.  The catheter insertion area swells very fast.  The insertion area is bleeding, and the bleeding does not stop when you hold steady pressure on the area.  Your arm or hand becomes pale, cool, tingly, or numb. These symptoms may represent a serious problem that is an emergency. Do not wait to see if the symptoms will go away. Get medical help right away. Call your local emergency services (911 in the [place] Do not drive yourself to the hospital. Summary  After the procedure, it is common to have bruising and tenderness at the site.  Follow instructions from your health care provider about how to take care of your radial site wound. Check   the wound every day for signs of infection.  Do not push, pull or lift anything that is heavier than 10 lb for 5 days.  This information is not intended to replace advice given to you by your health care provider. Make sure you discuss any questions you have with your health care provider. Document Revised: 12/26/2017 Document Reviewed: 12/26/2017 Elsevier Patient Education  2020 Elsevier Inc. 

## 2020-04-22 NOTE — Interval H&P Note (Signed)
History and Physical Interval Note:  04/22/2020 7:27 AM  Ryan [name]  has presented today for surgery, with the diagnosis of angina.  The various methods of treatment have been discussed with the patient and family. After consideration of risks, benefits and other options for treatment, the patient has consented to  Procedure(s): LEFT HEART CATH AND CORONARY ANGIOGRAPHY (N/A) as a surgical intervention.  The patient's history has been reviewed, patient examined, no change in status, stable for surgery.  I have reviewed the patient's chart and labs.  Questions were answered to the patient's satisfaction.    Cath Lab Visit (complete for each Cath Lab visit)  Clinical Evaluation Leading to the Procedure:   ACS: No.  Non-ACS:    Anginal Classification: CCS III  Anti-ischemic medical therapy: Maximal Therapy (2 or more classes of medications)  Non-Invasive Test Results: No non-invasive testing performed  Prior CABG: No previous CABG        [name]

## 2020-04-28 ENCOUNTER — Ambulatory Visit: Payer: Medicaid Other | Admitting: Physician Assistant

## 2020-04-28 NOTE — Progress Notes (Signed)
[address]       [place] [address]             [phone]                    Ryan [name] [facility] Medical Record #[id number] Date of Birth: 07-Jun-1966  Referring: Ryan [name]* Primary Care: Ryan Contras, MD Primary Cardiologist: Ryan Carrow, MD  Chief Complaint:    Chief Complaint  Patient presents with  . Coronary Artery Disease    Surgical eval Cardaic Cardiac 04/22/20    History of Present Illness:    Ryan [name] 54 y.o. male is seen in the office  today for consideration of CABG. the patient is currently on Brilinta and aspirin and Imdur.  Because of recurrent anginal symptoms and need for as needed nitroglycerin several times a week he underwent repeat cardiac catheterization as an outpatient last week.  He comes into the office today referred for consideration of coronary artery bypass grafting,    He has a history of on going tobacco abuse, CAD, HLD, PAD, HLD, ischemic cardiomyopathy and insulin dependent diabetes mellitus  Previous history includes an anterior STEMI in January 2013 with cardiac arrest and found to have an occluded mid LAD treated with a drug eluting stent. The third OM branch was chronically occluded and the RCA had mild disease. His post MI LVEF was 35% but this was found to be normal on follow up echo.   Cardiac cath in November 2017 with severe stent restenosis in the LAD, treated with a cutting balloon angioplasty.    Admitted to [facility] in August 2018 with an anterior STEMI. His LAD was occluded proximal to the old stent and this was treated with a drug eluting stent. Angioplasty performed on the ostial Diagonal branch. LVEF was 40% following the MI.   He had unstable angina in June 2020 and repeat cardiac cath showed severe restenosis in the mid LAD stented segment which was treated with cutting balloon angioplasty. He also had ostialstenosisof the diagonal branch (jailed by LAD  stents), mild to moderate non-obstructive disease in the mid RCAand moderate LV systolic dysfunction with anteroapical hypokinesis. LVEF=35-45%.He was placed on DAPT w/ ASA andBrilintaand continued on statin, ?blocker, ACEi and long acting nitrate   He has a history of severe peripheral vascular and cerebrovascular disease is followed  by Dr. Myra [name]. In 2013 he was found to have occlusion of his distal aorta and left subclavian artery. He underwent aortobifemoral bypass in February 2014 and left carotid to subclavian bypass in May 2014.  InApril2019,he underwent right femoral to AK popliteal bypass with saphenous vein and right femoral endarterectomy   Current Activity/ Functional Status:  Patient is independent with mobility/ambulation, transfers, ADL's, IADL's.   Zubrod Score: At the time of surgery this patient's most appropriate activity status/level should be described as: []     0    Normal activity, no symptoms [x]     1    Restricted in physical strenuous activity but ambulatory, able to do out light work []     2    Ambulatory and capable of self care, unable to do work activities, up and about               >50 % of waking hours                              []   3    Only limited self care, in bed greater than 50% of waking hours []     4    Completely disabled, no self care, confined to bed or chair []     5    Moribund   Past Medical History:  Diagnosis Date  . Acute myocardial infarction of other anterior wall, subsequent episode of care 12/12/2011  . Anal fissure   . Atherosclerosis of native artery of both lower extremities with intermittent claudication (HCC) 04/01/2018  . Bruises easily    d/t being on Effient  . CAD (coronary artery disease)    A. Acute Ant STEMI 12/12/2011   . Cardiomyopathy secondary 12/26/2011  . Chronic total occlusion of artery of the extremities (HCC) 02/26/2012  . Claudication (HCC) 12/26/2011  . Coronary atherosclerosis of native coronary  artery    a. ant STEMI with cardiac arrest 2013 s/p DES to mLAD. b. 12/28/2011 10/2016 s/p PTCA to mLAD.  . Diabetes mellitus without complication (HCC)   . Essential hypertension 10/23/2016  . GERD (gastroesophageal reflux disease)    "takes tums"  . History of blood transfusion    no abnormal reaction noted  . History of kidney stones   . Hypertension   . Ischemic cardiomyopathy    a. EF 35% in 02/2012 at time of acute MI, improved to normal on subsequent imaging.  . MI (myocardial infarction) (HCC)    AMI 1/13 - complicated by VT/Tosades  . Mixed hyperlipidemia   . Numbness and tingling of right arm 03/31/2013  . Occlusion and stenosis of carotid artery without mention of cerebral infarction 08/26/2012  . PAD (peripheral artery disease) (HCC)    followed by Vascular - aortobifem bypass 01/2013 & left carotid-subclavian artery bypass 04/2013  . Peripheral vascular disease, unspecified (HCC) 12/16/2012  . PVD (peripheral vascular disease) (HCC) 03/31/2013  . Rectal polyp   . Subclavian steal syndrome 08/11/2013  . Uncontrolled diabetes mellitus (HCC) 10/23/2016    Past Surgical History:  Procedure Laterality Date  . ABDOMINAL AORTOGRAM W/LOWER EXTREMITY N/A 06/26/2017   Procedure: Abdominal Aortogram w/Lower Extremity;  Surgeon: 10/25/2016, MD;  [facility]: [facility] INVASIVE CV LAB;  Service: Cardiovascular;  Laterality: N/A;  . ANAL FISSURECTOMY    . AORTA - BILATERAL FEMORAL ARTERY BYPASS GRAFT N/A 01/16/2013   Procedure: AORTA BIFEMORAL BYPASS GRAFT;  Surgeon: Nada Libman, MD;  Location: [facility] OR;  Service: Vascular;  Laterality: N/A;  . APPENDECTOMY    . CARDIAC CATHETERIZATION N/A 10/23/2016   Procedure: Left Heart Cath and Coronary Angiography;  Surgeon: Nada Libman, MD;  Location: [facility] INVASIVE CV LAB;  Service: Cardiovascular;  Laterality: N/A;  . CARDIAC CATHETERIZATION N/A 10/23/2016   Procedure: Coronary Balloon Angioplasty;  Surgeon: [facility], MD;  Location: [facility]  INVASIVE CV LAB;  Service: Cardiovascular;  Laterality: N/A;  . CAROTID-SUBCLAVIAN BYPASS GRAFT Left 04/03/2013   Procedure: BYPASS GRAFT CAROTID-SUBCLAVIAN;  Surgeon: Ryan Hazel, MD;  Location: [facility] OR;  Service: Vascular;  Laterality: Left;  . CIRCUMCISION N/A 12/15/2015   Procedure: CIRCUMCISION ADULT;  Surgeon: Nada Libman, MD;  Location: [facility] ORS;  Service: Urology;  Laterality: N/A;  . COLONOSCOPY    . CORONARY ANGIOPLASTY     stent placed Dec 12, 2011 and 2018  . CORONARY BALLOON ANGIOPLASTY N/A 05/23/2019   Procedure: CORONARY BALLOON ANGIOPLASTY;  Surgeon: 2019, MD;  Location: [facility] INVASIVE CV LAB;  Service: Cardiovascular;  Laterality: N/A;  . DIAGNOSTIC LAPAROSCOPY    .  ENDARTERECTOMY FEMORAL Right 03/13/2018   Procedure: FEMORAL ENDARTERECTOMY RIGHT;  Surgeon: Nada Libman, MD;  Location: [facility] OR;  Service: Vascular;  Laterality: Right;  . FEMORAL-POPLITEAL BYPASS GRAFT Right 03/13/2018   Procedure: FEMORAL-POPLITEAL ARTERY BYPASS WITH NON-REVERSE VEIN RIGHT;  Surgeon: Nada Libman, MD;  Location: [facility] OR;  Service: Vascular;  Laterality: Right;  . GROIN DISSECTION Right 03/13/2018   Procedure: RE-DO COMMON FEMORAL ARTERY EXPOSURE;  Surgeon: Nada Libman, MD;  Location: [facility] OR;  Service: Vascular;  Laterality: Right;  . I & D EXTREMITY Left 04/18/2013   Procedure: debridement of left neck lymphocele;  Surgeon: Nada Libman, MD;  Location: [facility] OR;  Service: Vascular;  Laterality: Left;  I and D of left neck  . I & D EXTREMITY Right 11/21/2014   Procedure: IRRIGATION AND DEBRIDEMENT RIGHT HAND;  Surgeon: Dominica Severin, MD;  Location: [facility] OR;  Service: Orthopedics;  Laterality: Right;  . INTRAVASCULAR PRESSURE WIRE/FFR STUDY N/A 04/22/2020   Procedure: INTRAVASCULAR PRESSURE WIRE/FFR STUDY;  Surgeon: Ryan Hazel, MD;  [facility]: [facility] INV[facility] CV [facility];  Service: Cardiovascular;  Laterality: N/A;  . LEFT HEART CATH AND CORONARY ANGIOGRAPHY N/A 05/23/2019    Procedure: LEFT HEART CATH AND CORONARY ANGIOGRAPHY;  Surgeon: Ryan Hazel, MD;  [facility]: [facility] INVASIVE CV LAB;  Service: Cardiovascular;  Laterality: N/A;  . LEFT HEART CATH AND CORONARY ANGIOGRAPHY N/A 04/22/2020   Procedure: LEFT HEART CATH AND CORONARY ANGIOGRAPHY;  Surgeon: Ryan Hazel, MD;  [facility]: [facility] INVAS[facility] CV LAB;  Service: Cardiovascular;  Laterality: N/A;  . LEFT HEART CATHETERIZATION WITH CORONARY ANGIOGRAM N/A 12/12/2011   Procedure: LEFT HEART CATHETERIZATION WITH CORONARY ANGIOGRAM;  Surgeon: Ryan Hazel, MD;  Location: [facility] CATH LAB;  Service: Cardiovascular;  Laterality: N/A;  . LOWER EXTREMITY ANGIOGRAM N/A 01/31/2012   Procedure: LOWER EXTREMITY ANGIOGRAM;  Surgeon: Ryan Hazel, MD;  Location: [facility] CATH LAB;  Service: Cardiovascular;  Laterality: N/A;  . LOWER EXTREMITY ANGIOGRAPHY N/A 04/23/2018   Procedure: LOWER EXTREMITY ANGIOGRAPHY;  Surgeon: Nada Libman, MD;  [facility]: [facility] INVASIVE CV LAB;  Service: Cardiovascular;  Laterality: N/A;  . PERCUTANEOUS CORONARY STENT INTERVENTION (PCI-S) Right 12/12/2011   Procedure: PERCUTANEOUS CORONARY STENT INTERVENTION (PCI-S);  Surgeon: Ryan Hazel, MD;  Location: [facility] CATH LAB;  Service: Cardiovascular;  Laterality: Right;  . PERIPHERAL VASCULAR INTERVENTION Left 04/23/2018   Procedure: PERIPHERAL VASCULAR INTERVENTION;  Surgeon: Nada Libman, MD;  Location: [facility] INVASIVE CV LAB;  Service: Cardiovascular;  Laterality: Left;  superficial femoral  . REPAIR EXTENSOR TENDON Right 11/21/2014   Procedure: WITH REPAIR/RECONSTRUCTION OF EXTENSOR TENDONS AS NEEDED;  Surgeon: Dominica Severin, MD;  Location: [facility] OR;  Service: Orthopedics;  Laterality: Right;    Family History  Problem Relation Age of Onset  . Cancer Father        Lung  . Diabetes Mother   . COPD Mother   . Cancer Maternal Uncle        Colon     Social History   Tobacco Use  Smoking Status Current Every Day Smoker  .  Packs/day: 0.25  . Years: 25.00  . Pack years: 6.25  . Types: Cigarettes  Smokeless Tobacco Never Used  Tobacco Comment   1-2 cigs a day    Social History   Substance and Sexual Activity  Alcohol Use No     Allergies  Allergen Reactions  . Hydrocodone Hives and Nausea And Vomiting    Current Outpatient Medications  Medication Sig Dispense  Refill  . aspirin EC 81 MG tablet Take 1 tablet (81 mg total) by mouth daily. 90 tablet 3  . BRILINTA 90 MG TABS tablet TAKE 1 TABLET BY MOUTH 2 TIMES A DAY (Patient taking differently: Take 90 mg by mouth 2 (two) times daily. ) 60 tablet 6  . carvedilol (COREG) 6.25 MG tablet Take 2 tablets (12.5 mg total) by mouth 2 (two) times daily. (Patient taking differently: Take 6.25 mg by mouth 2 (two) times daily. ) 180 tablet 3  . cyclobenzaprine (FLEXERIL) 10 MG tablet Take 1 tablet (10 mg total) by mouth 2 (two) times daily as needed for muscle spasms. 30 tablet 0  . enalapril (VASOTEC) 5 MG tablet TAKE ONE TABLET BY MOUTH TWICE DAILY (Patient taking differently: Take 5 mg by mouth 2 (two) times daily. ) 60 tablet 2  . gabapentin (NEURONTIN) 300 MG capsule Take 300 mg by mouth 3 (three) times daily.    . Insulin Glargine (LANTUS SOLOSTAR) 100 UNIT/ML Solostar Pen Inject 80 Units into the skin at bedtime.     . isosorbide mononitrate (IMDUR) 60 MG 24 hr tablet Take 1 tablet (60 mg total) by mouth every 12 (twelve) hours. 60 tablet 11  . nitroGLYCERIN (NITROSTAT) 0.4 MG SL tablet Place 1 tablet (0.4 mg total) under the tongue every 5 (five) minutes as needed for chest pain. 25 tablet 3  . pantoprazole (PROTONIX) 40 MG tablet Take 40 mg by mouth daily.    . simvastatin (ZOCOR) 80 MG tablet Take 80 mg by mouth at bedtime.    . traZODone (DESYREL) 100 MG tablet Take 100 mg by mouth at bedtime as needed for sleep.    . Vitamin D, Ergocalciferol, (DRISDOL) 1.25 MG (50000 UNIT) CAPS capsule Take 50,000 Units by mouth once a week.     No current  facility-administered medications for this visit.    Pertinent items are noted in HPI.   Review of Systems:     Cardiac Review of Systems: [Y] = yes  or   [ N ] = no   Chest Pain [ y   ]  Resting SOB [  n ] Exertional SOB  Cove.Etienne  ]  Orthopnea [ n ]   Pedal Edema [ n  ]    Palpitations [ n ] Syncope  [  n]   Presyncope [ n  ]   General Review of Systems: [Y] = yes [  ]=no Constitional: recent weight change [  ];  Wt loss over the last 3 months [   ] anorexia [  ]; fatigue [  ]; nausea [  ]; night sweats [  ]; fever [  ]; or chills [  ];           Eye : blurred vision [  ]; diplopia [   ]; vision changes [  ];  Amaurosis fugax[  ]; Resp: cough [ y ];  wheezing[  ];  hemoptysis[  ]; shortness of breath[  ]; paroxysmal nocturnal dyspnea[  ]; dyspnea on exertion[  ]; or orthopnea[  ];  GI:  gallstones[  ], vomiting[  ];  dysphagia[  ]; melena[  ];  hematochezia [  ]; heartburn[  ];   Hx of  Colonoscopy[  ]; GU: kidney stones [  ]; hematuria[  ];   dysuria [  ];  nocturia[  ];  history of     obstruction [  ]; urinary frequency [  ]  Skin: rash, swelling[  ];, hair loss[  ];  peripheral edema[  ];  or itching[  ]; Musculosketetal: myalgias[  ];  joint swelling[  ];  joint erythema[  ];  joint pain[  ];  back pain[  ];  Heme/Lymph: bruising[  ];  bleeding[  ];  anemia[  ];  Neuro: TIA[  ];  headaches[  ];  stroke[  ];  vertigo[  ];  seizures[  ];   paresthesias[ y ];  difficulty walking[  ];  Psych:depression[  ]; anxiety[  ];  Endocrine: diabetes[y  ];  thyroid dysfunction[  ];  Immunizations: Flu up to date [ y ]; Pneumococcal up to date [ n ];  Other:no covid vacination     PHYSICAL EXAMINATION: BP 121/83   Pulse 86   Temp 97.8 F (36.6 C) (Skin)   Resp 20   Ht 5\' 9"  (1.753 m)   Wt 214 lb (97.1 kg)   SpO2 96% Comment: RA  BMI 31.60 kg/m  General appearance: alert, cooperative, appears older than stated age and no distress Head: Normocephalic, without obvious abnormality,  atraumatic Neck: no adenopathy, no carotid bruit, no JVD, supple, symmetrical, trachea midline and thyroid not enlarged, symmetric, no tenderness/mass/nodules Lymph nodes: Cervical, supraclavicular, and axillary nodes normal. Resp: clear to auscultation bilaterally Cardio: regular rate and rhythm, S1, S2 normal, no murmur, click, rub or gallop GI: soft, non-tender; bowel sounds normal; no masses,  no organomegaly and Midline abdominal scar from previous aortobifem Extremities: extremities normal, atraumatic, no cyanosis or edema and Homans sign is negative, no sign of DVT Neurologic: Grossly normal Patient has left lower neck incision from previous carotid subclavian bypass, left arm radial pulse is strong, patient has palpable DP PT pulses bilaterally the feet.  Diagnostic Studies & Laboratory data:     Recent Radiology Findings:   CARDIAC CATHETERIZATION  Result Date: 04/22/2020  Ost 3rd Mrg to 3rd Mrg lesion is 100% stenosed.  Mid RCA lesion is 60% stenosed.  3rd Mrg lesion is 100% stenosed.  Prox LAD to Mid LAD lesion is 80% stenosed.  1st Diag lesion is 80% stenosed.  The left ventricular ejection fraction is 25-35% by visual estimate.  There is moderate left ventricular systolic dysfunction.  There is no mitral valve regurgitation.  1. Severe restenosis mid LAD stented segment 2. Severe stenosis ostium of the Diagonal branch, jailed by the stent 3. Chronic occlusion of the third OM branch, filling from collaterals 4. Moderate mid RCA stenosis. 5. Moderate segmental LV systolic dysfunction Recommendations: He has recurrence of stenosis in the mid LAD stented segment involving a large diagonal branch. This area was initially stented in the setting of an anterior MI due to vessel occlusion in 2013 and then restenosis was treated with cutting balloon angioplasty in 2017. He then had another anterior STEMI due to stent thrombosis/restenosis in 2018 and had another drug eluting stent placed at  that time overlapping the prior stent. I then treated restenosis in the stented segment in 2020. With the recurrence of disease in the stented segment and his diabetes, I think the best treatment option is CABG. LAD severity demonstrated with pressure wire. Further stenting of the LAD would likely compromise flow into the large Diagonal branch. Will continue ASA/brilinta/statin and beta blocker and refer as an outpatient to CT surgery for CABG.     I have independently reviewed the above radiology studies  and reviewed the findings with the patient.   Recent Lab Findings: Lab Results  Component Value Date   WBC 8.7 04/05/2020   HGB 16.2 04/05/2020   HCT 48.9 04/05/2020   PLT 186 04/05/2020   GLUCOSE 368 (H) 04/05/2020   CHOL 180 11/09/2017   TRIG 299 (H) 11/09/2017   HDL 31 (L) 11/09/2017   LDLCALC 89 11/09/2017   ALT 34 03/06/2018   AST 28 03/06/2018   NA 135 04/05/2020   K 4.4 04/05/2020   CL 96 04/05/2020   CREATININE 1.23 04/05/2020   BUN 15 04/05/2020   CO2 23 04/05/2020   TSH 0.832 01/18/2013   INR 1.01 03/06/2018   HGBA1C 11.3 (H) 01/21/2018   Last echo in the [facility] system 2013   Transthoracic Echocardiography Report (TTE)  Demographics  Patient Name   Ryan [name]  Gender        Male  Patient Number  [id number]  Race         Unknown  Visit Number   [id number]  Room Number      470  Accession Number [id number] Date of Study     07/31/2017  Date of Birth   1966-08-13   Referring Physician  Holley Raring, MD  Age        1 year(s)   Sonographer      Rashidi Pati Gallo, RCS                  Interpreting     Holley Raring, MD                  Physician Procedure Type of Study  TTE procedure: ECHOCARDIOGRAM W COLORFLOW SPECTRAL DOPPLER. Procedure date Date: 07/31/2017 Start: 04:26 PM Technical Quality: Adequate visualization Study Location: Portable Indications: Chest pain and  status Post Surgical. Patient Status: Routine Height: 68.9 inches Weight: 209.44 pounds BSA: 2.1 m2 BMI: 31.02 kg/m2 BP: 147/84 mmHg Conclusions Summary Distal anterior, Anteroapical/apical wall hypokinesis LVEF 35-40% No significant valvular abnormalites noted Signature ------------------------------------------------------------------------------  Electronically signed by Holley Raring, MD(Interpreting physician) on 07/31/2017  05:19 PM ------------------------------------------------------------------------------ Findings Mitral Valve Structurally normal mitral valve with good mobility and no significant regurgitation. Aortic Valve Structurally normal aortic valve with good leaflet mobility, and no regurgitation. Tricuspid Valve Tricuspid valve is structurally normal. No significant tricuspid regurgitation. Pulmonic Valve The pulmonic valve was not well visualized No Doppler evidence of pulmonic stenosis or insufficiency. Left Atrium Normal size left atrium. Left Ventricle Ejection fraction is visually estimated at 35-40% Right Atrium Normal right atrium. Right Ventricle Normal right ventricular size and function. Pericardial Effusion No evidence of pericardial effusion. Miscellaneous The aorta is within normal limits. The IVC is normal M-Mode/2D Measurements & Calculations  LV Diastolic Dimension: LV Systolic Dimension:  AV Cusp Separation: 1.27  4.56 cm         3.39 cm          cmLA Dimension: 3.22 cmAO  LV FS:25.66 %      LV Volume Diastolic: 76.5 Root Dimension: 2.99 cm  LV PW Diastolic: 1.1 cm ml  Septum Diastolic: 1.23  LV Volume Systolic: 37.6  cm            ml              LV EDV/LV EDV Index: 76.5              ml/36 m2LV ESV/LV ESV   LA/Aorta: 1.08              Index: 37.6 ml/18 m2  EF Calculated: 50.85 %              EF Estimated: 40 %  Doppler Measurements & Calculations  MV Peak E-Wave: 70.2 cm/s AV Peak Velocity: 173  MV Peak A-Wave: 98.8 cm/s cm/s           Estimated RVSP: 17.9 mmHg  MV E/A Ratio: 0.71    AV Peak Gradient: 11.97 Estimated RAP:5 mmHg  MV Peak Gradient: 1.97  mmHg  mmHg                           TR Velocity:179 cm/s                           TR Gradient:12.8164 mmHg [address] [place], [place] [address] [phone]  Other Result Information  Interface, Rad Results In - 07/31/2017  5:20 PM EDT Transthoracic Echocardiography Report (TTE)  Demographics  Patient Name      Ryan [name]   Gender                Male  Patient Number    [id number]   Race                  Unknown  Visit Number      [id number]    Room Number           470  Accession Number  [id number]  Date of Study         07/31/2017  Date of Birth     1966-01-02     Referring Physician   Holley Raring, MD  Age               51 year(s)     Sonographer           Rashidi Pati Gallo, RCS                                   Interpreting          Holley Raring, MD                                   Physician Procedure Type of Study  TTE procedure: ECHOCARDIOGRAM W COLORFLOW SPECTRAL DOPPLER. Procedure date Date: 07/31/2017 Start: 04:26 PM Technical Quality: Adequate visualization Study Location: Portable Indications: Chest pain and status Post Surgical. Patient Status: Routine Height: 68.9 inches Weight: 209.44 pounds BSA: 2.1 m2 BMI: 31.02 kg/m2 BP: 147/84 mmHg Conclusions Summary Distal anterior, Anteroapical/apical wall hypokinesis LVEF 35-40% No significant valvular abnormalites noted Signature ------------------------------------------------------------------------------  Electronically signed by Holley Raring, MD(Interpreting physician) on 07/31/2017  05:19 PM ------------------------------------------------------------------------------ Findings Mitral Valve Structurally  normal mitral valve with good mobility and no significant regurgitation. Aortic Valve Structurally normal aortic valve with good leaflet mobility, and no regurgitation. Tricuspid Valve Tricuspid valve is structurally normal. No significant tricuspid regurgitation. Pulmonic Valve The pulmonic valve was not well visualized No Doppler evidence of pulmonic stenosis or insufficiency. Left Atrium Normal size left atrium. Left Ventricle Ejection fraction is visually estimated at 35-40% Right Atrium Normal right atrium. Right Ventricle Normal right ventricular size and function. Pericardial Effusion No evidence of pericardial effusion. Miscellaneous The aorta is within normal limits. The IVC is normal M-Mode/2D Measurements & Calculations  LV Diastolic Dimension:  LV  Systolic Dimension:    AV Cusp Separation: 1.27  4.56 cm                  3.39 cm                   cmLA Dimension: 3.22 cmAO  LV FS:25.66 %            LV Volume Diastolic: 76.5 Root Dimension: 2.99 cm  LV PW Diastolic: 1.1 cm  ml  Septum Diastolic: 1.23   LV Volume Systolic: 37.6  cm                       ml                           LV EDV/LV EDV Index: 76.5                           ml/36 m2LV ESV/LV ESV     LA/Aorta: 1.08                           Index: 37.6 ml/18 m2                           EF Calculated: 50.85 %                           EF Estimated: 40 % Doppler Measurements & Calculations  MV Peak E-Wave: 70.2 cm/s AV Peak Velocity: 173  MV Peak A-Wave: 98.8 cm/s cm/s                     Estimated RVSP: 17.9 mmHg  MV E/A Ratio: 0.71        AV Peak Gradient: 11.97  Estimated RAP:5 mmHg  MV Peak Gradient: 1.97    mmHg  mmHg                                                     TR Velocity:179 cm/s                                                     TR Gradient:12.8164 mmHg [address] [place], [place] [address] [phone]    Assessment / Plan:   #1 recurrent anginal symptoms related to recurrent in-stent  stenosis of LAD stents and patient with poorly controlled diabetes hemoglobin A1c of 11 last checked 2019, continued smoking, and poor LV function ejection fraction 25 to 35% by ventriculogram-I have reviewed the patient's history and films with him.  I discussed with the patient and his family consideration for coronary artery bypass grafting particularly with grafts to the LAD and diagonal, small branches of the circumflex were too small to bypass.   Case has been reviewed with Dr. McAlhany-cardiology particular in regard to transitioning off Brilinta prior to surgery , will obtain a preop echocardiogram, stop his Brilinta for 6 to 7 days-Dr. [name] did not feel that admission with in-hospital  transition was indicated.   Tentatively planned surgery for June 3.  Severe peripheral vascular disease status post aortobifem and right femoropopliteal Severe cerebrovascular disease status post left subclavian carotid bypass  I  spent 65 minutes with  the patient face to face and greater then 50% of the time was spent in counseling and coordination of care.    Grace Isaac [phone]     04/29/2020 11:19 AM

## 2020-04-29 ENCOUNTER — Other Ambulatory Visit: Payer: Self-pay

## 2020-04-29 ENCOUNTER — Other Ambulatory Visit: Payer: Self-pay | Admitting: *Deleted

## 2020-04-29 ENCOUNTER — Institutional Professional Consult (permissible substitution): Payer: Medicaid Other | Admitting: Cardiothoracic Surgery

## 2020-04-29 VITALS — BP 121/83 | HR 86 | Temp 97.8°F | Resp 20 | Ht 69.0 in | Wt 214.0 lb

## 2020-04-29 DIAGNOSIS — I2510 Atherosclerotic heart disease of native coronary artery without angina pectoris: Secondary | ICD-10-CM

## 2020-04-29 NOTE — Pre-Procedure Instructions (Signed)
Your procedure is scheduled on Wednesday June 2nd, 2021 from 08:30 AM- 2:49 PM.  Report to [facility] Main Entrance "A" at 06:30 A.M., and check in at the Admitting office.  Call this number if you have problems the morning of surgery:  [phone]  Call [phone] if you have any questions prior to your surgery date Monday-Friday 8am-4pm.    Remember:  Do not eat or drink after midnight the night before your surgery.    Take these medicines the morning of surgery with A SIP OF WATER: carvedilol (COREG) gabapentin (NEURONTIN) isosorbide mononitrate (IMDUR) pantoprazole (PROTONIX)  IF NEEDED: cyclobenzaprine (FLEXERIL) nitroGLYCERIN (NITROSTAT)   *Stop BRILINTA 6-7 days prior to surgery.  *Follow your surgeon's instructions on when to stop Aspirin.  If no instructions were given by your surgeon then you will need to call the office to get those instructions.    As of today, STOP taking any Aleve, Naproxen, Ibuprofen, Motrin, Advil, Goody's, BC's, all herbal medications, fish oil, and all vitamins.   WHAT DO I DO ABOUT MY DIABETES MEDICATION?  . THE NIGHT BEFORE SURGERY, take 40 units of Insulin Glargine (LANTUS SOLOSTAR).      . The day of surgery, do not take diabetes injectables, including Insulin Glargine (LANTUS SOLOSTAR).   HOW TO MANAGE YOUR DIABETES BEFORE AND AFTER SURGERY  Why is it important to control my blood sugar before and after surgery? . Improving blood sugar levels before and after surgery helps healing and can limit problems. . A way of improving blood sugar control is eating a healthy diet by: o  Eating less sugar and carbohydrates o  Increasing activity/exercise o  Talking with your doctor about reaching your blood sugar goals . High blood sugars (greater than 180 mg/dL) can raise your risk of infections and slow your recovery, so you will need to focus on controlling your diabetes during the weeks before surgery. . Make sure that the doctor who  takes care of your diabetes knows about your planned surgery including the date and location.  How do I manage my blood sugar before surgery? . Check your blood sugar at least 4 times a day, starting 2 days before surgery, to make sure that the level is not too high or low. . Check your blood sugar the morning of your surgery when you wake up and every 2 hours until you get to the Short Stay unit. o If your blood sugar is less than 70 mg/dL, you will need to treat for low blood sugar: - Do not take insulin. - Treat a low blood sugar (less than 70 mg/dL) with  cup of clear juice (cranberry or apple), 4 glucose tablets, OR glucose gel. - Recheck blood sugar in 15 minutes after treatment (to make sure it is greater than 70 mg/dL). If your blood sugar is not greater than 70 mg/dL on recheck, call [phone] for further instructions. . Report your blood sugar to the short stay nurse when you get to Short Stay.  . If you are admitted to the hospital after surgery: o Your blood sugar will be checked by the staff and you will probably be given insulin after surgery (instead of oral diabetes medicines) to make sure you have good blood sugar levels. o The goal for blood sugar control after surgery is 80-180 mg/dL.         The Morning of Surgery:            Do not wear jewelry.  Do not wear lotions, powders, colognes, or deodorant.            Men may shave face and neck.            Do not bring valuables to the hospital.            [facility] is not responsible for any belongings or valuables.  Do NOT Smoke (Tobacco/Vapping) or drink Alcohol 24 hours prior to your procedure.  If you use a CPAP at night, you may bring all equipment for your overnight stay.   Contacts, glasses, dentures or bridgework may not be worn into surgery.      For patients admitted to the hospital, discharge time will be determined by your treatment team.   Patients discharged the day of surgery will not be  allowed to drive home, and someone needs to stay with them for 24 hours.    Special instructions:   [place]- Preparing For Surgery  Before surgery, you can play an important role. Because skin is not sterile, your skin needs to be as free of germs as possible. You can reduce the number of germs on your skin by washing with CHG (chlorahexidine gluconate) Soap before surgery.  CHG is an antiseptic cleaner which kills germs and bonds with the skin to continue killing germs even after washing.    Oral Hygiene is also important to reduce your risk of infection.  Remember - BRUSH YOUR TEETH THE MORNING OF SURGERY WITH YOUR REGULAR TOOTHPASTE  Please do not use if you have an allergy to CHG or antibacterial soaps. If your skin becomes reddened/irritated stop using the CHG.  Do not shave (including legs and underarms) for at least 48 hours prior to first CHG shower. It is OK to shave your face.  Please follow these instructions carefully.   1. Shower the NIGHT BEFORE SURGERY and the MORNING OF SURGERY with CHG Soap.   2. If you chose to wash your hair, wash your hair first as usual with your normal shampoo.  3. After you shampoo, rinse your hair and body thoroughly to remove the shampoo.  4. Use CHG as you would any other liquid soap. You can apply CHG directly to the skin and wash gently with a scrungie or a clean washcloth.   5. Apply the CHG Soap to your body ONLY FROM THE NECK DOWN.  Do not use on open wounds or open sores. Avoid contact with your eyes, ears, mouth and genitals (private parts). Wash Face and genitals (private parts)  with your normal soap.   6. Wash thoroughly, paying special attention to the area where your surgery will be performed.  7. Thoroughly rinse your body with warm water from the neck down.  8. DO NOT shower/wash with your normal soap after using and rinsing off the CHG Soap.  9. Pat yourself dry with a CLEAN TOWEL.  10. Wear CLEAN PAJAMAS to bed the night  before surgery, wear comfortable clothes the morning of surgery  11. Place CLEAN SHEETS on your bed the night of your first shower and DO NOT SLEEP WITH PETS.   Day of Surgery:   Do not apply any deodorants/lotions.  Please wear clean clothes to the hospital/surgery center.   Remember to brush your teeth WITH YOUR REGULAR TOOTHPASTE.   Please read over the following fact sheets that you were given.

## 2020-04-29 NOTE — Progress Notes (Signed)
Patient denies shortness of breath, fever, cough or chest pain.  PCP - Dr Fleet Contras Cardiologist - Dr Sanjuana Kava  Chest x-ray - 04/30/20 EKG - 04/22/20 Stress Test - 11/20/14 ECHO - 07/31/17 Cardiac Cath - 04/22/20  Fasting Blood Sugar -  190s Checks Blood Sugar 3 times a day  Blood Thinner Instructions:  Stop Brilinta 6-7 days prior to surgery per MD.  Last dose on  Wed, 5/26 per patient  Aspirin Instructions: Follow your surgeon's instructions on when to stop aspirin prior to surgery,  If no instructions were given by your surgeon then you will need to call the office for those instructions.  Anesthesia review: Yes  STOP now taking any Aspirin (unless otherwise instructed by your surgeon), Aleve, Naproxen, Ibuprofen, Motrin, Advil, Goody's, BC's, all herbal medications, fish oil, and all vitamins.   Coronavirus Screening Covid scheduled 05/01/20 Do you have any of the following symptoms:  Cough yes/no: No Fever (>100.21F)  yes/no: No Runny nose yes/no: No Sore throat yes/no: No Difficulty breathing/shortness of breath  yes/no: No  Have you traveled in the last 14 days and where? yes/no: No  Patient verbalized understanding of instructions that were given to them at the PAT appointment.

## 2020-04-30 ENCOUNTER — Ambulatory Visit (HOSPITAL_COMMUNITY)
Admission: RE | Admit: 2020-04-30 | Discharge: 2020-04-30 | Disposition: A | Discharge status: Home / Self Care | Payer: Medicaid Other | Source: Ambulatory Visit | Attending: Cardiothoracic Surgery | Admitting: Cardiothoracic Surgery

## 2020-04-30 ENCOUNTER — Encounter (HOSPITAL_COMMUNITY)
Admission: RE | Admit: 2020-04-30 | Discharge: 2020-04-30 | Disposition: A | Discharge status: Home / Self Care | Payer: Medicaid Other | Source: Ambulatory Visit | Attending: Cardiothoracic Surgery | Admitting: Cardiothoracic Surgery

## 2020-04-30 ENCOUNTER — Other Ambulatory Visit: Payer: Self-pay | Admitting: *Deleted

## 2020-04-30 ENCOUNTER — Encounter (HOSPITAL_COMMUNITY): Payer: Self-pay

## 2020-04-30 ENCOUNTER — Other Ambulatory Visit: Payer: Self-pay

## 2020-04-30 DIAGNOSIS — I2510 Atherosclerotic heart disease of native coronary artery without angina pectoris: Secondary | ICD-10-CM

## 2020-04-30 HISTORY — DX: Presence of dental prosthetic device (complete) (partial): Z97.2

## 2020-04-30 HISTORY — DX: Unspecified osteoarthritis, unspecified site: M19.90

## 2020-04-30 LAB — CBC
HCT: 48.9 % (ref 39.0–52.0)
Hemoglobin: 16.3 g/dL (ref 13.0–17.0)
MCH: 31.5 pg (ref 26.0–34.0)
MCHC: 33.3 g/dL (ref 30.0–36.0)
MCV: 94.6 fL (ref 80.0–100.0)
Platelets: 199 10*3/uL (ref 150–400)
RBC: 5.17 MIL/uL (ref 4.22–5.81)
RDW: 13 % (ref 11.5–15.5)
WBC: 9.6 10*3/uL (ref 4.0–10.5)
nRBC: 0 % (ref 0.0–0.2)

## 2020-04-30 LAB — URINALYSIS, ROUTINE W REFLEX MICROSCOPIC
Bilirubin Urine: NEGATIVE
Glucose, UA: NEGATIVE mg/dL
Hgb urine dipstick: NEGATIVE
Ketones, ur: NEGATIVE mg/dL
Leukocytes,Ua: NEGATIVE
Nitrite: NEGATIVE
Protein, ur: NEGATIVE mg/dL
Specific Gravity, Urine: 1.003 — ABNORMAL LOW (ref 1.005–1.030)
pH: 6 (ref 5.0–8.0)

## 2020-04-30 LAB — TYPE AND SCREEN
ABO/RH(D): O POS
Antibody Screen: NEGATIVE

## 2020-04-30 LAB — HEMOGLOBIN A1C
Hgb A1c MFr Bld: 11.8 % — ABNORMAL HIGH (ref 4.8–5.6)
Mean Plasma Glucose: 291.96 mg/dL

## 2020-04-30 LAB — COMPREHENSIVE METABOLIC PANEL
ALT: 48 U/L — ABNORMAL HIGH (ref 0–44)
AST: 35 U/L (ref 15–41)
Albumin: 3.8 g/dL (ref 3.5–5.0)
Alkaline Phosphatase: 123 U/L (ref 38–126)
Anion gap: 9 (ref 5–15)
BUN: 15 mg/dL (ref 6–20)
CO2: 23 mmol/L (ref 22–32)
Calcium: 8.6 mg/dL — ABNORMAL LOW (ref 8.9–10.3)
Chloride: 104 mmol/L (ref 98–111)
Creatinine, Ser: 0.91 mg/dL (ref 0.61–1.24)
GFR calc Af Amer: >60 mL/min (ref >60)
GFR calc non Af Amer: >60 mL/min (ref >60)
Glucose, Bld: 129 mg/dL — ABNORMAL HIGH (ref 70–99)
Potassium: 4.2 mmol/L (ref 3.5–5.1)
Sodium: 136 mmol/L (ref 135–145)
Total Bilirubin: 0.8 mg/dL (ref 0.3–1.2)
Total Protein: 6.9 g/dL (ref 6.5–8.1)

## 2020-04-30 LAB — PROTIME-INR
INR: 1 (ref 0.8–1.2)
Prothrombin Time: 12.9 seconds (ref 11.4–15.2)

## 2020-04-30 LAB — SURGICAL PCR SCREEN
MRSA, PCR: NEGATIVE
Staphylococcus aureus: NEGATIVE

## 2020-04-30 LAB — GLUCOSE, CAPILLARY: Glucose-Capillary: 132 mg/dL — ABNORMAL HIGH (ref 70–99)

## 2020-04-30 LAB — APTT: aPTT: 28 seconds (ref 24–36)

## 2020-04-30 NOTE — Pre-Procedure Instructions (Signed)
Your procedure is scheduled on Wednesday June 2nd, 2021 from 08:30 AM- 2:49 PM.  Report to [facility] Main Entrance "A" at 06:30 A.M., and check in at the Admitting office.  Call this number if you have problems the morning of surgery:  [phone]  Call [phone] if you have any questions prior to your surgery date Monday-Friday 8am-4pm.    Remember:  Do not eat or drink after midnight the night before your surgery.    Take these medicines the morning of surgery with A SIP OF WATER: carvedilol (COREG) gabapentin (NEURONTIN) isosorbide mononitrate (IMDUR) pantoprazole (PROTONIX)  IF NEEDED: cyclobenzaprine (FLEXERIL) nitroGLYCERIN (NITROSTAT)  oxyCODONE-acetaminophen (PERCOCET)   *Stop BRILINTA 6-7 days prior to surgery.  *Follow your surgeon's instructions on when to stop Aspirin.  If no instructions were given by your surgeon then you will need to call the office to get those instructions.    As of today, STOP taking any Aleve, Naproxen, Ibuprofen, Motrin, Advil, Goody's, BC's, all herbal medications, fish oil, and all vitamins.   WHAT DO I DO ABOUT MY DIABETES MEDICATION?  . THE NIGHT BEFORE SURGERY, take 40 units of Insulin Glargine (LANTUS SOLOSTAR).      . The day of surgery, do not take diabetes injectables, including Insulin Glargine (LANTUS SOLOSTAR).  Do not take Humalog Insulin on day of surgery unless your CBG is greater than 220.  If greater than 220, you may take 1/2 your sliding scale dose.   HOW TO MANAGE YOUR DIABETES BEFORE AND AFTER SURGERY  Why is it important to control my blood sugar before and after surgery? . Improving blood sugar levels before and after surgery helps healing and can limit problems. . A way of improving blood sugar control is eating a healthy diet by: o  Eating less sugar and carbohydrates o  Increasing activity/exercise o  Talking with your doctor about reaching your blood sugar goals . High blood sugars (greater than 180  mg/dL) can raise your risk of infections and slow your recovery, so you will need to focus on controlling your diabetes during the weeks before surgery. . Make sure that the doctor who takes care of your diabetes knows about your planned surgery including the date and location.  How do I manage my blood sugar before surgery? . Check your blood sugar at least 4 times a day, starting 2 days before surgery, to make sure that the level is not too high or low. . Check your blood sugar the morning of your surgery when you wake up and every 2 hours until you get to the Short Stay unit. o If your blood sugar is less than 70 mg/dL, you will need to treat for low blood sugar: - Do not take insulin. - Treat a low blood sugar (less than 70 mg/dL) with  cup of clear juice (cranberry or apple), 4 glucose tablets, OR glucose gel. - Recheck blood sugar in 15 minutes after treatment (to make sure it is greater than 70 mg/dL). If your blood sugar is not greater than 70 mg/dL on recheck, call [phone] for further instructions. . Report your blood sugar to the short stay nurse when you get to Short Stay.  . If you are admitted to the hospital after surgery: o Your blood sugar will be checked by the staff and you will probably be given insulin after surgery (instead of oral diabetes medicines) to make sure you have good blood sugar levels. o The goal for blood sugar control after  surgery is 80-180 mg/dL.         The Morning of Surgery:            Do not wear jewelry.            Do not wear lotions, powders, colognes, or deodorant.            Men may shave face and neck.            Do not bring valuables to the hospital.            [facility] is not responsible for any belongings or valuables.  Do NOT Smoke (Tobacco/Vapping) or drink Alcohol 24 hours prior to your procedure.  If you use a CPAP at night, you may bring all equipment for your overnight stay.   Contacts, glasses, dentures or bridgework may  not be worn into surgery.      For patients admitted to the hospital, discharge time will be determined by your treatment team.   Patients discharged the day of surgery will not be allowed to drive home, and someone needs to stay with them for 24 hours.    Special instructions:   [place]- Preparing For Surgery  Before surgery, you can play an important role. Because skin is not sterile, your skin needs to be as free of germs as possible. You can reduce the number of germs on your skin by washing with CHG (chlorahexidine gluconate) Soap before surgery.  CHG is an antiseptic cleaner which kills germs and bonds with the skin to continue killing germs even after washing.    Oral Hygiene is also important to reduce your risk of infection.  Remember - BRUSH YOUR TEETH THE MORNING OF SURGERY WITH YOUR REGULAR TOOTHPASTE  Please do not use if you have an allergy to CHG or antibacterial soaps. If your skin becomes reddened/irritated stop using the CHG.  Do not shave (including legs and underarms) for at least 48 hours prior to first CHG shower. It is OK to shave your face.  Please follow these instructions carefully.   1. Shower the NIGHT BEFORE SURGERY and the MORNING OF SURGERY with CHG Soap.   2. If you chose to wash your hair, wash your hair first as usual with your normal shampoo.  3. After you shampoo, rinse your hair and body thoroughly to remove the shampoo.  4. Use CHG as you would any other liquid soap. You can apply CHG directly to the skin and wash gently with a scrungie or a clean washcloth.   5. Apply the CHG Soap to your body ONLY FROM THE NECK DOWN.  Do not use on open wounds or open sores. Avoid contact with your eyes, ears, mouth and genitals (private parts). Wash Face and genitals (private parts)  with your normal soap.   6. Wash thoroughly, paying special attention to the area where your surgery will be performed.  7. Thoroughly rinse your body with warm water from the  neck down.  8. DO NOT shower/wash with your normal soap after using and rinsing off the CHG Soap.  9. Pat yourself dry with a CLEAN TOWEL.  10. Wear CLEAN PAJAMAS to bed the night before surgery, wear comfortable clothes the morning of surgery  11. Place CLEAN SHEETS on your bed the night of your first shower and DO NOT SLEEP WITH PETS.   Day of Surgery:   Do not apply any deodorants/lotions.  Please wear clean clothes to the hospital/surgery center.  Remember to brush your teeth WITH YOUR REGULAR TOOTHPASTE.   Please read over the following fact sheets that you were given.

## 2020-05-01 ENCOUNTER — Other Ambulatory Visit (HOSPITAL_COMMUNITY)
Admission: RE | Admit: 2020-05-01 | Discharge: 2020-05-01 | Disposition: A | Discharge status: Home / Self Care | Payer: Medicaid Other | Source: Ambulatory Visit | Attending: Cardiothoracic Surgery | Admitting: Cardiothoracic Surgery

## 2020-05-01 DIAGNOSIS — Z01812 Encounter for preprocedural laboratory examination: Secondary | ICD-10-CM | POA: Diagnosis not present

## 2020-05-01 DIAGNOSIS — Z20822 Contact with and (suspected) exposure to covid-19: Secondary | ICD-10-CM | POA: Insufficient documentation

## 2020-05-01 LAB — SARS CORONAVIRUS 2 (TAT 6-24 HRS): SARS Coronavirus 2: NEGATIVE

## 2020-05-04 ENCOUNTER — Encounter (HOSPITAL_COMMUNITY): Payer: Self-pay | Admitting: Cardiothoracic Surgery

## 2020-05-04 ENCOUNTER — Ambulatory Visit (HOSPITAL_BASED_OUTPATIENT_CLINIC_OR_DEPARTMENT_OTHER)
Admission: RE | Admit: 2020-05-04 | Discharge: 2020-05-04 | Disposition: A | Discharge status: Home / Self Care | Payer: Medicaid Other | Source: Ambulatory Visit | Attending: Cardiothoracic Surgery | Admitting: Cardiothoracic Surgery

## 2020-05-04 ENCOUNTER — Other Ambulatory Visit: Payer: Self-pay

## 2020-05-04 DIAGNOSIS — I2510 Atherosclerotic heart disease of native coronary artery without angina pectoris: Secondary | ICD-10-CM | POA: Insufficient documentation

## 2020-05-04 MED ORDER — SODIUM CHLORIDE 0.9 % IV SOLN
INTRAVENOUS | Status: DC
  Filled 2020-05-04: qty 30

## 2020-05-04 MED ORDER — NITROGLYCERIN IN D5W 200-5 MCG/ML-% IV SOLN
2.0000 ug/min | INTRAVENOUS | Status: AC
  Administered 2020-05-05: 16.67 ug/min via INTRAVENOUS
  Filled 2020-05-04: qty 250

## 2020-05-04 MED ORDER — SODIUM CHLORIDE 0.9 % IV SOLN
750.0000 mg | INTRAVENOUS | Status: AC
  Administered 2020-05-05: 750 mg via INTRAVENOUS
  Filled 2020-05-04: qty 750

## 2020-05-04 MED ORDER — VANCOMYCIN HCL 1500 MG/300ML IV SOLN
1500.0000 mg | INTRAVENOUS | Status: AC
  Administered 2020-05-05: 1500 mg via INTRAVENOUS
  Filled 2020-05-04: qty 300

## 2020-05-04 MED ORDER — TRANEXAMIC ACID 1000 MG/10ML IV SOLN
1.5000 mg/kg/h | INTRAVENOUS | Status: AC
  Administered 2020-05-05: 1.5 mg/kg/h via INTRAVENOUS
  Filled 2020-05-04: qty 25

## 2020-05-04 MED ORDER — POTASSIUM CHLORIDE 2 MEQ/ML IV SOLN
80.0000 meq | INTRAVENOUS | Status: DC
  Filled 2020-05-04: qty 40

## 2020-05-04 MED ORDER — INSULIN REGULAR(HUMAN) IN NACL 100-0.9 UT/100ML-% IV SOLN
INTRAVENOUS | Status: AC
  Administered 2020-05-05: 5.5 [IU]/h via INTRAVENOUS
  Filled 2020-05-04: qty 100

## 2020-05-04 MED ORDER — PLASMA-LYTE 148 IV SOLN
INTRAVENOUS | Status: DC
  Filled 2020-05-04: qty 2.5

## 2020-05-04 MED ORDER — MAGNESIUM SULFATE 50 % IJ SOLN
40.0000 meq | INTRAMUSCULAR | Status: DC
  Filled 2020-05-04: qty 9.85

## 2020-05-04 MED ORDER — TRANEXAMIC ACID (OHS) PUMP PRIME SOLUTION
2.0000 mg/kg | INTRAVENOUS | Status: DC
  Filled 2020-05-04: qty 1.95

## 2020-05-04 MED ORDER — TRANEXAMIC ACID (OHS) BOLUS VIA INFUSION
15.0000 mg/kg | INTRAVENOUS | Status: AC
  Administered 2020-05-05: 1462.5 mg via INTRAVENOUS
  Filled 2020-05-04: qty 1463

## 2020-05-04 MED ORDER — PHENYLEPHRINE HCL-NACL 20-0.9 MG/250ML-% IV SOLN
30.0000 ug/min | INTRAVENOUS | Status: AC
  Administered 2020-05-05: 60 ug/min via INTRAVENOUS
  Filled 2020-05-04: qty 250

## 2020-05-04 MED ORDER — MILRINONE LACTATE IN DEXTROSE 20-5 MG/100ML-% IV SOLN
0.3000 ug/kg/min | INTRAVENOUS | Status: AC
  Administered 2020-05-05: 4875 ug via INTRAVENOUS
  Filled 2020-05-04: qty 100

## 2020-05-04 MED ORDER — SODIUM CHLORIDE 0.9 % IV SOLN
1.5000 g | INTRAVENOUS | Status: AC
  Administered 2020-05-05: 1.5 g via INTRAVENOUS
  Filled 2020-05-04: qty 1.5

## 2020-05-04 MED ORDER — NOREPINEPHRINE 4 MG/250ML-% IV SOLN
0.0000 ug/min | INTRAVENOUS | Status: AC
  Administered 2020-05-05: 3 ug/min via INTRAVENOUS
  Filled 2020-05-04: qty 250

## 2020-05-04 MED ORDER — DEXMEDETOMIDINE HCL IN NACL 400 MCG/100ML IV SOLN
0.1000 ug/kg/h | INTRAVENOUS | Status: AC
  Administered 2020-05-05: .5 ug/kg/h via INTRAVENOUS
  Filled 2020-05-04: qty 100

## 2020-05-04 MED ORDER — EPINEPHRINE HCL 5 MG/250ML IV SOLN IN NS
0.0000 ug/min | INTRAVENOUS | Status: DC
  Filled 2020-05-04: qty 250

## 2020-05-04 NOTE — Progress Notes (Signed)
  Echocardiogram 2D Echocardiogram has been performed.  Ryan [name] 05/04/2020, 3:39 PM

## 2020-05-04 NOTE — Progress Notes (Signed)
Pre cabg has been completed.   Preliminary results in CV Proc.   Blanch Media 05/04/2020 2:25 PM

## 2020-05-05 ENCOUNTER — Inpatient Hospital Stay (HOSPITAL_COMMUNITY)
Admission: RE | Admit: 2020-05-05 | Discharge: 2020-05-12 | DRG: 236 | Disposition: A | Discharge status: Home / Self Care | Payer: Medicaid Other | Attending: Cardiothoracic Surgery | Admitting: Cardiothoracic Surgery

## 2020-05-05 ENCOUNTER — Encounter (HOSPITAL_COMMUNITY)
Admission: RE | Disposition: A | Discharge status: Home / Self Care | Payer: Self-pay | Source: Home / Self Care | Attending: Cardiothoracic Surgery

## 2020-05-05 ENCOUNTER — Encounter (HOSPITAL_COMMUNITY): Payer: Self-pay | Admitting: Cardiothoracic Surgery

## 2020-05-05 ENCOUNTER — Other Ambulatory Visit: Payer: Self-pay

## 2020-05-05 ENCOUNTER — Inpatient Hospital Stay (HOSPITAL_COMMUNITY): Payer: Medicaid Other | Admitting: Physician Assistant

## 2020-05-05 ENCOUNTER — Inpatient Hospital Stay (HOSPITAL_COMMUNITY): Payer: Medicaid Other

## 2020-05-05 ENCOUNTER — Inpatient Hospital Stay (HOSPITAL_COMMUNITY)
Admission: RE | Admit: 2020-05-05 | Discharge: 2020-05-05 | Disposition: A | Discharge status: Home / Self Care | Payer: Medicaid Other | Source: Ambulatory Visit | Attending: Cardiothoracic Surgery | Admitting: Cardiothoracic Surgery

## 2020-05-05 DIAGNOSIS — Z794 Long term (current) use of insulin: Secondary | ICD-10-CM

## 2020-05-05 DIAGNOSIS — E1142 Type 2 diabetes mellitus with diabetic polyneuropathy: Secondary | ICD-10-CM | POA: Diagnosis present

## 2020-05-05 DIAGNOSIS — I97190 Other postprocedural cardiac functional disturbances following cardiac surgery: Principal | ICD-10-CM | POA: Diagnosis present

## 2020-05-05 DIAGNOSIS — Z825 Family history of asthma and other chronic lower respiratory diseases: Secondary | ICD-10-CM

## 2020-05-05 DIAGNOSIS — I255 Ischemic cardiomyopathy: Secondary | ICD-10-CM | POA: Diagnosis not present

## 2020-05-05 DIAGNOSIS — T82855A Stenosis of coronary artery stent, initial encounter: Secondary | ICD-10-CM | POA: Diagnosis present

## 2020-05-05 DIAGNOSIS — Z20822 Contact with and (suspected) exposure to covid-19: Secondary | ICD-10-CM | POA: Diagnosis present

## 2020-05-05 DIAGNOSIS — Y711 Therapeutic (nonsurgical) and rehabilitative cardiovascular devices associated with adverse incidents: Secondary | ICD-10-CM | POA: Diagnosis present

## 2020-05-05 DIAGNOSIS — I679 Cerebrovascular disease, unspecified: Secondary | ICD-10-CM | POA: Diagnosis present

## 2020-05-05 DIAGNOSIS — I25110 Atherosclerotic heart disease of native coronary artery with unstable angina pectoris: Secondary | ICD-10-CM | POA: Diagnosis not present

## 2020-05-05 DIAGNOSIS — E1165 Type 2 diabetes mellitus with hyperglycemia: Secondary | ICD-10-CM | POA: Diagnosis present

## 2020-05-05 DIAGNOSIS — I472 Ventricular tachycardia: Secondary | ICD-10-CM | POA: Diagnosis not present

## 2020-05-05 DIAGNOSIS — E1151 Type 2 diabetes mellitus with diabetic peripheral angiopathy without gangrene: Secondary | ICD-10-CM | POA: Diagnosis present

## 2020-05-05 DIAGNOSIS — F17210 Nicotine dependence, cigarettes, uncomplicated: Secondary | ICD-10-CM | POA: Diagnosis present

## 2020-05-05 DIAGNOSIS — E782 Mixed hyperlipidemia: Secondary | ICD-10-CM | POA: Diagnosis present

## 2020-05-05 DIAGNOSIS — Z87442 Personal history of urinary calculi: Secondary | ICD-10-CM

## 2020-05-05 DIAGNOSIS — I252 Old myocardial infarction: Secondary | ICD-10-CM

## 2020-05-05 DIAGNOSIS — I2510 Atherosclerotic heart disease of native coronary artery without angina pectoris: Secondary | ICD-10-CM | POA: Diagnosis present

## 2020-05-05 DIAGNOSIS — Z9119 Patient's noncompliance with other medical treatment and regimen: Secondary | ICD-10-CM | POA: Diagnosis not present

## 2020-05-05 DIAGNOSIS — I110 Hypertensive heart disease with heart failure: Secondary | ICD-10-CM | POA: Diagnosis not present

## 2020-05-05 DIAGNOSIS — Z8674 Personal history of sudden cardiac arrest: Secondary | ICD-10-CM | POA: Diagnosis not present

## 2020-05-05 DIAGNOSIS — Z23 Encounter for immunization: Secondary | ICD-10-CM | POA: Diagnosis not present

## 2020-05-05 DIAGNOSIS — Z833 Family history of diabetes mellitus: Secondary | ICD-10-CM | POA: Diagnosis not present

## 2020-05-05 DIAGNOSIS — Z79891 Long term (current) use of opiate analgesic: Secondary | ICD-10-CM

## 2020-05-05 DIAGNOSIS — Z801 Family history of malignant neoplasm of trachea, bronchus and lung: Secondary | ICD-10-CM

## 2020-05-05 DIAGNOSIS — Z7982 Long term (current) use of aspirin: Secondary | ICD-10-CM

## 2020-05-05 DIAGNOSIS — I5021 Acute systolic (congestive) heart failure: Secondary | ICD-10-CM | POA: Diagnosis not present

## 2020-05-05 DIAGNOSIS — J9811 Atelectasis: Secondary | ICD-10-CM | POA: Diagnosis not present

## 2020-05-05 DIAGNOSIS — Z9689 Presence of other specified functional implants: Secondary | ICD-10-CM

## 2020-05-05 DIAGNOSIS — Z79899 Other long term (current) drug therapy: Secondary | ICD-10-CM

## 2020-05-05 DIAGNOSIS — I4891 Unspecified atrial fibrillation: Secondary | ICD-10-CM | POA: Diagnosis not present

## 2020-05-05 DIAGNOSIS — Z951 Presence of aortocoronary bypass graft: Secondary | ICD-10-CM

## 2020-05-05 DIAGNOSIS — I5042 Chronic combined systolic (congestive) and diastolic (congestive) heart failure: Secondary | ICD-10-CM | POA: Diagnosis present

## 2020-05-05 HISTORY — PX: TEE WITHOUT CARDIOVERSION: SHX5443

## 2020-05-05 HISTORY — PX: CORONARY ARTERY BYPASS GRAFT: SHX141

## 2020-05-05 LAB — POCT I-STAT 7, (LYTES, BLD GAS, ICA,H+H)
Acid-base deficit: 3 mmol/L — ABNORMAL HIGH (ref 0.0–2.0)
Acid-base deficit: 3 mmol/L — ABNORMAL HIGH (ref 0.0–2.0)
Acid-base deficit: 3 mmol/L — ABNORMAL HIGH (ref 0.0–2.0)
Acid-base deficit: 2 mmol/L (ref 0.0–2.0)
Acid-base deficit: 2 mmol/L (ref 0.0–2.0)
Bicarbonate: 25.9 mmol/L (ref 20.0–28.0)
Bicarbonate: 26.2 mmol/L (ref 20.0–28.0)
Bicarbonate: 23.5 mmol/L (ref 20.0–28.0)
Bicarbonate: 24.2 mmol/L (ref 20.0–28.0)
Bicarbonate: 23.8 mmol/L (ref 20.0–28.0)
Calcium, Ion: 1.16 mmol/L (ref 1.15–1.40)
Calcium, Ion: 1.19 mmol/L (ref 1.15–1.40)
Calcium, Ion: 1.13 mmol/L — ABNORMAL LOW (ref 1.15–1.40)
Calcium, Ion: 1.17 mmol/L (ref 1.15–1.40)
Calcium, Ion: 1.14 mmol/L — ABNORMAL LOW (ref 1.15–1.40)
HCT: 43 % (ref 39.0–52.0)
HCT: 43 % (ref 39.0–52.0)
HCT: 40 % (ref 39.0–52.0)
HCT: 40 % (ref 39.0–52.0)
HCT: 39 % (ref 39.0–52.0)
Hemoglobin: 14.6 g/dL (ref 13.0–17.0)
Hemoglobin: 14.6 g/dL (ref 13.0–17.0)
Hemoglobin: 13.6 g/dL (ref 13.0–17.0)
Hemoglobin: 13.6 g/dL (ref 13.0–17.0)
Hemoglobin: 13.3 g/dL (ref 13.0–17.0)
O2 Saturation: 92 %
O2 Saturation: 92 %
O2 Saturation: 96 %
O2 Saturation: 95 %
O2 Saturation: 93 %
Patient temperature: 36.9
Patient temperature: 36.6
Potassium: 4.2 mmol/L (ref 3.5–5.1)
Potassium: 4.1 mmol/L (ref 3.5–5.1)
Potassium: 4.2 mmol/L (ref 3.5–5.1)
Potassium: 4.3 mmol/L (ref 3.5–5.1)
Potassium: 4.1 mmol/L (ref 3.5–5.1)
Sodium: 141 mmol/L (ref 135–145)
Sodium: 141 mmol/L (ref 135–145)
Sodium: 141 mmol/L (ref 135–145)
Sodium: 139 mmol/L (ref 135–145)
Sodium: 140 mmol/L (ref 135–145)
TCO2: 28 mmol/L (ref 22–32)
TCO2: 28 mmol/L (ref 22–32)
TCO2: 25 mmol/L (ref 22–32)
TCO2: 26 mmol/L (ref 22–32)
TCO2: 25 mmol/L (ref 22–32)
pCO2 arterial: 59.4 mmHg — ABNORMAL HIGH (ref 32.0–48.0)
pCO2 arterial: 66.6 mmHg (ref 32.0–48.0)
pCO2 arterial: 44.8 mmHg (ref 32.0–48.0)
pCO2 arterial: 45.3 mmHg (ref 32.0–48.0)
pCO2 arterial: 40.7 mmHg (ref 32.0–48.0)
pH, Arterial: 7.247 — ABNORMAL LOW (ref 7.350–7.450)
pH, Arterial: 7.203 — ABNORMAL LOW (ref 7.350–7.450)
pH, Arterial: 7.328 — ABNORMAL LOW (ref 7.350–7.450)
pH, Arterial: 7.336 — ABNORMAL LOW (ref 7.350–7.450)
pH, Arterial: 7.373 (ref 7.350–7.450)
pO2, Arterial: 75 mmHg — ABNORMAL LOW (ref 83.0–108.0)
pO2, Arterial: 78 mmHg — ABNORMAL LOW (ref 83.0–108.0)
pO2, Arterial: 91 mmHg (ref 83.0–108.0)
pO2, Arterial: 83 mmHg (ref 83.0–108.0)
pO2, Arterial: 69 mmHg — ABNORMAL LOW (ref 83.0–108.0)

## 2020-05-05 LAB — BASIC METABOLIC PANEL
Anion gap: 5 (ref 5–15)
BUN: 14 mg/dL (ref 6–20)
CO2: 23 mmol/L (ref 22–32)
Calcium: 7.6 mg/dL — ABNORMAL LOW (ref 8.9–10.3)
Chloride: 107 mmol/L (ref 98–111)
Creatinine, Ser: 0.92 mg/dL (ref 0.61–1.24)
GFR calc Af Amer: >60 mL/min (ref >60)
GFR calc non Af Amer: >60 mL/min (ref >60)
Glucose, Bld: 173 mg/dL — ABNORMAL HIGH (ref 70–99)
Potassium: 4.1 mmol/L (ref 3.5–5.1)
Sodium: 135 mmol/L (ref 135–145)

## 2020-05-05 LAB — PULMONARY FUNCTION TEST
FEF 25-75 Pre: 1.89 L/sec
FEF2575-%Pred-Pre: 58 %
FEV1-%Pred-Pre: 67 %
FEV1-Pre: 2.5 L
FEV1FVC-%Pred-Pre: 96 %
FEV6-%Pred-Pre: 72 %
FEV6-Pre: 3.34 L
FEV6FVC-%Pred-Pre: 103 %
FVC-%Pred-Pre: 70 %
FVC-Pre: 3.37 L
Pre FEV1/FVC ratio: 74 %
Pre FEV6/FVC Ratio: 99 %

## 2020-05-05 LAB — GLUCOSE, CAPILLARY
Glucose-Capillary: 163 mg/dL — ABNORMAL HIGH (ref 70–99)
Glucose-Capillary: 164 mg/dL — ABNORMAL HIGH (ref 70–99)
Glucose-Capillary: 144 mg/dL — ABNORMAL HIGH (ref 70–99)
Glucose-Capillary: 144 mg/dL — ABNORMAL HIGH (ref 70–99)
Glucose-Capillary: 147 mg/dL — ABNORMAL HIGH (ref 70–99)
Glucose-Capillary: 154 mg/dL — ABNORMAL HIGH (ref 70–99)
Glucose-Capillary: 137 mg/dL — ABNORMAL HIGH (ref 70–99)
Glucose-Capillary: 158 mg/dL — ABNORMAL HIGH (ref 70–99)
Glucose-Capillary: 161 mg/dL — ABNORMAL HIGH (ref 70–99)
Glucose-Capillary: 134 mg/dL — ABNORMAL HIGH (ref 70–99)

## 2020-05-05 LAB — CBC
HCT: 42.1 % (ref 39.0–52.0)
HCT: 39.5 % (ref 39.0–52.0)
Hemoglobin: 13.8 g/dL (ref 13.0–17.0)
Hemoglobin: 13.4 g/dL (ref 13.0–17.0)
MCH: 31.7 pg (ref 26.0–34.0)
MCH: 32.4 pg (ref 26.0–34.0)
MCHC: 32.8 g/dL (ref 30.0–36.0)
MCHC: 33.9 g/dL (ref 30.0–36.0)
MCV: 96.8 fL (ref 80.0–100.0)
MCV: 95.4 fL (ref 80.0–100.0)
Platelets: 134 10*3/uL — ABNORMAL LOW (ref 150–400)
Platelets: 126 10*3/uL — ABNORMAL LOW (ref 150–400)
RBC: 4.35 MIL/uL (ref 4.22–5.81)
RBC: 4.14 MIL/uL — ABNORMAL LOW (ref 4.22–5.81)
RDW: 13.4 % (ref 11.5–15.5)
RDW: 13.3 % (ref 11.5–15.5)
WBC: 20.1 10*3/uL — ABNORMAL HIGH (ref 4.0–10.5)
WBC: 15.2 10*3/uL — ABNORMAL HIGH (ref 4.0–10.5)
nRBC: 0 % (ref 0.0–0.2)
nRBC: 0 % (ref 0.0–0.2)

## 2020-05-05 LAB — PLATELET COUNT: Platelets: 135 10*3/uL — ABNORMAL LOW (ref 150–400)

## 2020-05-05 LAB — PROTIME-INR
INR: 1.3 — ABNORMAL HIGH (ref 0.8–1.2)
Prothrombin Time: 15.4 seconds — ABNORMAL HIGH (ref 11.4–15.2)

## 2020-05-05 LAB — MAGNESIUM: Magnesium: 2.5 mg/dL — ABNORMAL HIGH (ref 1.7–2.4)

## 2020-05-05 LAB — BLOOD GAS, ARTERIAL
Acid-Base Excess: 2.1 mmol/L — ABNORMAL HIGH (ref 0.0–2.0)
Bicarbonate: 26.6 mmol/L (ref 20.0–28.0)
FIO2: 21
O2 Saturation: 98.7 %
Patient temperature: 37
pCO2 arterial: 44.9 mmHg (ref 32.0–48.0)
pH, Arterial: 7.391 (ref 7.350–7.450)
pO2, Arterial: 133 mmHg — ABNORMAL HIGH (ref 83.0–108.0)

## 2020-05-05 LAB — HEMOGLOBIN AND HEMATOCRIT, BLOOD
HCT: 34.4 % — ABNORMAL LOW (ref 39.0–52.0)
Hemoglobin: 11.4 g/dL — ABNORMAL LOW (ref 13.0–17.0)

## 2020-05-05 LAB — APTT: aPTT: 28 seconds (ref 24–36)

## 2020-05-05 SURGERY — CORONARY ARTERY BYPASS GRAFTING (CABG)
Anesthesia: General | Site: Chest

## 2020-05-05 MED ORDER — METOPROLOL TARTRATE 5 MG/5ML IV SOLN
2.5000 mg | INTRAVENOUS | Status: DC | PRN
  Administered 2020-05-08 – 2020-05-09 (×2): 5 mg via INTRAVENOUS
  Filled 2020-05-05 (×2): qty 5

## 2020-05-05 MED ORDER — FAMOTIDINE IN NACL 20-0.9 MG/50ML-% IV SOLN
20.0000 mg | Freq: Two times a day (BID) | INTRAVENOUS | Status: AC
  Administered 2020-05-05 (×2): 20 mg via INTRAVENOUS
  Filled 2020-05-05: qty 50

## 2020-05-05 MED ORDER — TRAMADOL HCL 50 MG PO TABS
50.0000 mg | ORAL_TABLET | ORAL | Status: DC | PRN
  Administered 2020-05-05 – 2020-05-06 (×5): 100 mg via ORAL
  Filled 2020-05-05 (×5): qty 2

## 2020-05-05 MED ORDER — DEXAMETHASONE SODIUM PHOSPHATE 10 MG/ML IJ SOLN
INTRAMUSCULAR | Status: DC | PRN
  Administered 2020-05-05: 8 mg via INTRAVENOUS

## 2020-05-05 MED ORDER — ASPIRIN 81 MG PO CHEW: 324.0000 mg | CHEWABLE_TABLET | Freq: Every day | ORAL | Status: DC

## 2020-05-05 MED ORDER — CHLORHEXIDINE GLUCONATE 4 % EX LIQD: 30.0000 mL | CUTANEOUS | Status: DC

## 2020-05-05 MED ORDER — PHENYLEPHRINE 40 MCG/ML (10ML) SYRINGE FOR IV PUSH (FOR BLOOD PRESSURE SUPPORT)
PREFILLED_SYRINGE | INTRAVENOUS | Status: DC | PRN
  Administered 2020-05-05 (×2): 80 ug via INTRAVENOUS
  Administered 2020-05-05: 120 ug via INTRAVENOUS
  Administered 2020-05-05: 80 ug via INTRAVENOUS
  Administered 2020-05-05: 120 ug via INTRAVENOUS
  Administered 2020-05-05 (×4): 80 ug via INTRAVENOUS

## 2020-05-05 MED ORDER — SODIUM CHLORIDE 0.9% FLUSH
3.0000 mL | INTRAVENOUS | Status: DC | PRN
  Administered 2020-05-09: 3 mL via INTRAVENOUS

## 2020-05-05 MED ORDER — ACETAMINOPHEN 160 MG/5ML PO SOLN: 1000.0000 mg | Freq: Four times a day (QID) | ORAL | Status: AC

## 2020-05-05 MED ORDER — PANTOPRAZOLE SODIUM 40 MG PO TBEC
40.0000 mg | DELAYED_RELEASE_TABLET | Freq: Every day | ORAL | Status: DC
  Administered 2020-05-07 – 2020-05-12 (×6): 40 mg via ORAL
  Filled 2020-05-05 (×7): qty 1

## 2020-05-05 MED ORDER — SODIUM CHLORIDE 0.9% FLUSH
3.0000 mL | Freq: Two times a day (BID) | INTRAVENOUS | Status: DC
  Administered 2020-05-06 – 2020-05-12 (×10): 3 mL via INTRAVENOUS

## 2020-05-05 MED ORDER — ROCURONIUM BROMIDE 10 MG/ML (PF) SYRINGE
PREFILLED_SYRINGE | INTRAVENOUS | Status: AC
  Filled 2020-05-05: qty 20

## 2020-05-05 MED ORDER — LACTATED RINGERS IV SOLN: 500.0000 mL | Freq: Once | INTRAVENOUS | Status: DC | PRN

## 2020-05-05 MED ORDER — MIDAZOLAM HCL 2 MG/2ML IJ SOLN
INTRAMUSCULAR | Status: AC
  Filled 2020-05-05: qty 2

## 2020-05-05 MED ORDER — PHENYLEPHRINE HCL-NACL 10-0.9 MG/250ML-% IV SOLN
INTRAVENOUS | Status: DC | PRN
Start: 2020-05-05 — End: 2020-05-05
  Administered 2020-05-05: 30 ug/min via INTRAVENOUS

## 2020-05-05 MED ORDER — ALBUTEROL SULFATE HFA 108 (90 BASE) MCG/ACT IN AERS
INHALATION_SPRAY | RESPIRATORY_TRACT | Status: DC | PRN
Start: 2020-05-05 — End: 2020-05-05
  Administered 2020-05-05: 6 via RESPIRATORY_TRACT

## 2020-05-05 MED ORDER — PHENYLEPHRINE 40 MCG/ML (10ML) SYRINGE FOR IV PUSH (FOR BLOOD PRESSURE SUPPORT)
PREFILLED_SYRINGE | INTRAVENOUS | Status: AC
  Filled 2020-05-05: qty 20

## 2020-05-05 MED ORDER — ONDANSETRON HCL 4 MG/2ML IJ SOLN: 4.0000 mg | Freq: Four times a day (QID) | INTRAMUSCULAR | Status: DC | PRN

## 2020-05-05 MED ORDER — METOPROLOL TARTRATE 25 MG/10 ML ORAL SUSPENSION: 12.5000 mg | Freq: Two times a day (BID) | ORAL | Status: DC

## 2020-05-05 MED ORDER — METOPROLOL TARTRATE 12.5 MG HALF TABLET
12.5000 mg | ORAL_TABLET | Freq: Two times a day (BID) | ORAL | Status: DC
  Administered 2020-05-06 (×2): 12.5 mg via ORAL
  Filled 2020-05-05 (×2): qty 1

## 2020-05-05 MED ORDER — ACETAMINOPHEN 650 MG RE SUPP
650.0000 mg | Freq: Once | RECTAL | Status: AC
  Administered 2020-05-05: 650 mg via RECTAL

## 2020-05-05 MED ORDER — ARTIFICIAL TEARS OPHTHALMIC OINT
TOPICAL_OINTMENT | OPHTHALMIC | Status: DC | PRN
  Administered 2020-05-05: 1 via OPHTHALMIC

## 2020-05-05 MED ORDER — PROTAMINE SULFATE 10 MG/ML IV SOLN
INTRAVENOUS | Status: AC
  Filled 2020-05-05: qty 25

## 2020-05-05 MED ORDER — ONDANSETRON HCL 4 MG/2ML IJ SOLN
INTRAMUSCULAR | Status: DC | PRN
  Administered 2020-05-05: 4 mg via INTRAVENOUS

## 2020-05-05 MED ORDER — BISACODYL 5 MG PO TBEC
10.0000 mg | DELAYED_RELEASE_TABLET | Freq: Every day | ORAL | Status: DC
  Administered 2020-05-06 – 2020-05-07 (×2): 10 mg via ORAL
  Filled 2020-05-05 (×4): qty 2

## 2020-05-05 MED ORDER — HEPARIN SODIUM (PORCINE) 1000 UNIT/ML IJ SOLN
INTRAMUSCULAR | Status: AC
  Filled 2020-05-05: qty 1

## 2020-05-05 MED ORDER — CHLORHEXIDINE GLUCONATE 0.12 % MT SOLN
15.0000 mL | Freq: Once | OROMUCOSAL | Status: AC
  Administered 2020-05-05: 15 mL via OROMUCOSAL
  Filled 2020-05-05: qty 15

## 2020-05-05 MED ORDER — ORAL CARE MOUTH RINSE
15.0000 mL | OROMUCOSAL | Status: DC
  Administered 2020-05-05 – 2020-05-06 (×6): 15 mL via OROMUCOSAL

## 2020-05-05 MED ORDER — ACETAMINOPHEN 500 MG PO TABS
1000.0000 mg | ORAL_TABLET | Freq: Four times a day (QID) | ORAL | Status: AC
  Administered 2020-05-06 – 2020-05-10 (×19): 1000 mg via ORAL
  Filled 2020-05-05 (×22): qty 2

## 2020-05-05 MED ORDER — DOPAMINE-DEXTROSE 3.2-5 MG/ML-% IV SOLN: 3.0000 ug/kg/min | INTRAVENOUS | Status: DC

## 2020-05-05 MED ORDER — LEVALBUTEROL HCL 0.63 MG/3ML IN NEBU
0.6300 mg | INHALATION_SOLUTION | Freq: Four times a day (QID) | RESPIRATORY_TRACT | Status: DC
  Administered 2020-05-05 – 2020-05-06 (×6): 0.63 mg via RESPIRATORY_TRACT
  Filled 2020-05-05 (×7): qty 3

## 2020-05-05 MED ORDER — CHLORHEXIDINE GLUCONATE CLOTH 2 % EX PADS
6.0000 | MEDICATED_PAD | Freq: Every day | CUTANEOUS | Status: DC
  Administered 2020-05-05 – 2020-05-11 (×3): 6 via TOPICAL

## 2020-05-05 MED ORDER — CHLORHEXIDINE GLUCONATE CLOTH 2 % EX PADS
6.0000 | MEDICATED_PAD | Freq: Every day | CUTANEOUS | Status: DC
  Administered 2020-05-06 – 2020-05-09 (×4): 6 via TOPICAL

## 2020-05-05 MED ORDER — NOREPINEPHRINE 4 MG/250ML-% IV SOLN: 0.0000 ug/min | INTRAVENOUS | Status: DC

## 2020-05-05 MED ORDER — SODIUM CHLORIDE 0.9 % IV SOLN
1.5000 g | Freq: Two times a day (BID) | INTRAVENOUS | Status: AC
  Administered 2020-05-05 – 2020-05-07 (×4): 1.5 g via INTRAVENOUS
  Filled 2020-05-05 (×4): qty 1.5

## 2020-05-05 MED ORDER — CHLORHEXIDINE GLUCONATE 0.12 % MT SOLN
15.0000 mL | OROMUCOSAL | Status: AC
  Administered 2020-05-05: 15 mL via OROMUCOSAL

## 2020-05-05 MED ORDER — SODIUM CHLORIDE 0.9% FLUSH
10.0000 mL | Freq: Two times a day (BID) | INTRAVENOUS | Status: DC
  Administered 2020-05-05 – 2020-05-10 (×8): 10 mL

## 2020-05-05 MED ORDER — BISACODYL 10 MG RE SUPP: 10.0000 mg | Freq: Every day | RECTAL | Status: DC

## 2020-05-05 MED ORDER — VANCOMYCIN HCL IN DEXTROSE 1-5 GM/200ML-% IV SOLN
1000.0000 mg | Freq: Once | INTRAVENOUS | Status: AC
  Administered 2020-05-05: 1000 mg via INTRAVENOUS
  Filled 2020-05-05: qty 200

## 2020-05-05 MED ORDER — SODIUM CHLORIDE 0.9 % IV SOLN
250.0000 mL | INTRAVENOUS | Status: DC
  Administered 2020-05-06: 250 mL via INTRAVENOUS

## 2020-05-05 MED ORDER — GABAPENTIN 300 MG PO CAPS
300.0000 mg | ORAL_CAPSULE | Freq: Three times a day (TID) | ORAL | Status: DC
  Administered 2020-05-06 – 2020-05-12 (×20): 300 mg via ORAL
  Filled 2020-05-05 (×20): qty 1

## 2020-05-05 MED ORDER — PHENYLEPHRINE HCL-NACL 20-0.9 MG/250ML-% IV SOLN: 0.0000 ug/min | INTRAVENOUS | Status: DC

## 2020-05-05 MED ORDER — ARTIFICIAL TEARS OPHTHALMIC OINT
TOPICAL_OINTMENT | OPHTHALMIC | Status: AC
  Filled 2020-05-05: qty 3.5

## 2020-05-05 MED ORDER — 0.9 % SODIUM CHLORIDE (POUR BTL) OPTIME
TOPICAL | Status: DC | PRN
  Administered 2020-05-05: 6000 mL

## 2020-05-05 MED ORDER — SODIUM CHLORIDE 0.9 % IV SOLN
INTRAVENOUS | Status: DC | PRN
Start: 2020-05-05 — End: 2020-05-05

## 2020-05-05 MED ORDER — LACTATED RINGERS IV SOLN: INTRAVENOUS | Status: DC | PRN

## 2020-05-05 MED ORDER — LACTATED RINGERS IV SOLN: INTRAVENOUS | Status: DC

## 2020-05-05 MED ORDER — ACETAMINOPHEN 160 MG/5ML PO SOLN: 650.0000 mg | Freq: Once | ORAL | Status: AC

## 2020-05-05 MED ORDER — MILRINONE LACTATE IN DEXTROSE 20-5 MG/100ML-% IV SOLN
0.3000 ug/kg/min | INTRAVENOUS | Status: DC
  Administered 2020-05-06 – 2020-05-07 (×4): 0.3 ug/kg/min via INTRAVENOUS
  Filled 2020-05-05 (×6): qty 100

## 2020-05-05 MED ORDER — SODIUM CHLORIDE 0.45 % IV SOLN: INTRAVENOUS | Status: DC | PRN

## 2020-05-05 MED ORDER — PHENYLEPHRINE 40 MCG/ML (10ML) SYRINGE FOR IV PUSH (FOR BLOOD PRESSURE SUPPORT)
PREFILLED_SYRINGE | INTRAVENOUS | Status: AC
  Filled 2020-05-05: qty 10

## 2020-05-05 MED ORDER — SODIUM CHLORIDE 0.9 % IV SOLN: INTRAVENOUS | Status: DC

## 2020-05-05 MED ORDER — MIDAZOLAM HCL 5 MG/5ML IJ SOLN
INTRAMUSCULAR | Status: DC | PRN
  Administered 2020-05-05: 1 mg via INTRAVENOUS
  Administered 2020-05-05 (×2): 2 mg via INTRAVENOUS
  Administered 2020-05-05: 4 mg via INTRAVENOUS
  Administered 2020-05-05: 1 mg via INTRAVENOUS
  Administered 2020-05-05: 2 mg via INTRAVENOUS

## 2020-05-05 MED ORDER — DEXTROSE 50 % IV SOLN: 0.0000 mL | INTRAVENOUS | Status: DC | PRN

## 2020-05-05 MED ORDER — ALBUMIN HUMAN 5 % IV SOLN: INTRAVENOUS | Status: DC | PRN

## 2020-05-05 MED ORDER — MILRINONE LACTATE IN DEXTROSE 20-5 MG/100ML-% IV SOLN
INTRAVENOUS | Status: DC | PRN
Start: 2020-05-05 — End: 2020-05-05
  Administered 2020-05-05: .3 ug/kg/min via INTRAVENOUS

## 2020-05-05 MED ORDER — MIDAZOLAM HCL (PF) 10 MG/2ML IJ SOLN
INTRAMUSCULAR | Status: AC
  Filled 2020-05-05: qty 2

## 2020-05-05 MED ORDER — INSULIN REGULAR(HUMAN) IN NACL 100-0.9 UT/100ML-% IV SOLN
INTRAVENOUS | Status: DC
  Filled 2020-05-05: qty 100

## 2020-05-05 MED ORDER — FENTANYL CITRATE (PF) 250 MCG/5ML IJ SOLN
INTRAMUSCULAR | Status: DC | PRN
  Administered 2020-05-05 (×2): 100 ug via INTRAVENOUS
  Administered 2020-05-05: 150 ug via INTRAVENOUS
  Administered 2020-05-05: 50 ug via INTRAVENOUS
  Administered 2020-05-05 (×2): 150 ug via INTRAVENOUS
  Administered 2020-05-05: 100 ug via INTRAVENOUS
  Administered 2020-05-05 (×2): 150 ug via INTRAVENOUS
  Administered 2020-05-05: 50 ug via INTRAVENOUS
  Administered 2020-05-05: 100 ug via INTRAVENOUS

## 2020-05-05 MED ORDER — ASPIRIN EC 325 MG PO TBEC
325.0000 mg | DELAYED_RELEASE_TABLET | Freq: Every day | ORAL | Status: DC
  Administered 2020-05-06 – 2020-05-11 (×6): 325 mg via ORAL
  Filled 2020-05-05 (×6): qty 1

## 2020-05-05 MED ORDER — POTASSIUM CHLORIDE 10 MEQ/50ML IV SOLN: 10.0000 meq | INTRAVENOUS | Status: AC

## 2020-05-05 MED ORDER — CHLORHEXIDINE GLUCONATE 0.12% ORAL RINSE (MEDLINE KIT)
15.0000 mL | Freq: Two times a day (BID) | OROMUCOSAL | Status: DC
  Administered 2020-05-05 – 2020-05-06 (×2): 15 mL via OROMUCOSAL

## 2020-05-05 MED ORDER — ONDANSETRON HCL 4 MG/2ML IJ SOLN
INTRAMUSCULAR | Status: AC
  Filled 2020-05-05: qty 2

## 2020-05-05 MED ORDER — NITROGLYCERIN IN D5W 200-5 MCG/ML-% IV SOLN: 0.0000 ug/min | INTRAVENOUS | Status: DC

## 2020-05-05 MED ORDER — LACTATED RINGERS IV SOLN
INTRAVENOUS | Status: DC | PRN
Start: 2020-05-05 — End: 2020-05-05

## 2020-05-05 MED ORDER — ROCURONIUM BROMIDE 10 MG/ML (PF) SYRINGE
PREFILLED_SYRINGE | INTRAVENOUS | Status: AC
  Filled 2020-05-05: qty 10

## 2020-05-05 MED ORDER — FENTANYL CITRATE (PF) 250 MCG/5ML IJ SOLN
INTRAMUSCULAR | Status: AC
  Filled 2020-05-05: qty 5

## 2020-05-05 MED ORDER — HEMOSTATIC AGENTS (NO CHARGE) OPTIME
TOPICAL | Status: DC | PRN
Start: 2020-05-05 — End: 2020-05-05
  Administered 2020-05-05 (×2): 1 via TOPICAL

## 2020-05-05 MED ORDER — DEXMEDETOMIDINE HCL IN NACL 400 MCG/100ML IV SOLN
0.0000 ug/kg/h | INTRAVENOUS | Status: DC
  Administered 2020-05-05: 0.7 ug/kg/h via INTRAVENOUS
  Filled 2020-05-05: qty 100

## 2020-05-05 MED ORDER — PROTAMINE SULFATE 10 MG/ML IV SOLN
INTRAVENOUS | Status: DC | PRN
  Administered 2020-05-05: 30 mg via INTRAVENOUS
  Administered 2020-05-05 (×2): 40 mg via INTRAVENOUS
  Administered 2020-05-05: 60 mg via INTRAVENOUS
  Administered 2020-05-05: 30 mg via INTRAVENOUS
  Administered 2020-05-05 (×2): 40 mg via INTRAVENOUS
  Administered 2020-05-05 (×2): 30 mg via INTRAVENOUS

## 2020-05-05 MED ORDER — HEPARIN SODIUM (PORCINE) 1000 UNIT/ML IJ SOLN
INTRAMUSCULAR | Status: DC | PRN
  Administered 2020-05-05: 34000 [IU] via INTRAVENOUS

## 2020-05-05 MED ORDER — DOCUSATE SODIUM 100 MG PO CAPS
200.0000 mg | ORAL_CAPSULE | Freq: Every day | ORAL | Status: DC
  Administered 2020-05-06 – 2020-05-08 (×3): 200 mg via ORAL
  Filled 2020-05-05 (×5): qty 2

## 2020-05-05 MED ORDER — METOPROLOL TARTRATE 12.5 MG HALF TABLET: 12.5000 mg | ORAL_TABLET | Freq: Once | ORAL | Status: DC

## 2020-05-05 MED ORDER — PLASMA-LYTE 148 IV SOLN
INTRAVENOUS | Status: DC | PRN
  Administered 2020-05-05: 500 mL via INTRAVASCULAR

## 2020-05-05 MED ORDER — FENTANYL CITRATE (PF) 100 MCG/2ML IJ SOLN
25.0000 ug | INTRAMUSCULAR | Status: DC | PRN
  Administered 2020-05-05 – 2020-05-06 (×4): 25 ug via INTRAVENOUS
  Filled 2020-05-05 (×4): qty 2

## 2020-05-05 MED ORDER — DEXAMETHASONE SODIUM PHOSPHATE 10 MG/ML IJ SOLN
INTRAMUSCULAR | Status: AC
  Filled 2020-05-05: qty 1

## 2020-05-05 MED ORDER — PROPOFOL 10 MG/ML IV BOLUS
INTRAVENOUS | Status: DC | PRN
  Administered 2020-05-05: 110 mg via INTRAVENOUS
  Administered 2020-05-05: 40 mg via INTRAVENOUS
  Administered 2020-05-05: 30 mg via INTRAVENOUS

## 2020-05-05 MED ORDER — ALBUMIN HUMAN 5 % IV SOLN
250.0000 mL | INTRAVENOUS | Status: AC | PRN
  Administered 2020-05-05 (×2): 12.5 g via INTRAVENOUS

## 2020-05-05 MED ORDER — ALBUTEROL SULFATE HFA 108 (90 BASE) MCG/ACT IN AERS
INHALATION_SPRAY | RESPIRATORY_TRACT | Status: AC
  Filled 2020-05-05: qty 6.7

## 2020-05-05 MED ORDER — SODIUM CHLORIDE (PF) 0.9 % IJ SOLN
INTRAMUSCULAR | Status: AC
  Filled 2020-05-05: qty 10

## 2020-05-05 MED ORDER — LIDOCAINE 2% (20 MG/ML) 5 ML SYRINGE
INTRAMUSCULAR | Status: AC
  Filled 2020-05-05: qty 5

## 2020-05-05 MED ORDER — ATORVASTATIN CALCIUM 40 MG PO TABS
40.0000 mg | ORAL_TABLET | Freq: Every day | ORAL | Status: DC
  Administered 2020-05-06 – 2020-05-12 (×7): 40 mg via ORAL
  Filled 2020-05-05 (×8): qty 1

## 2020-05-05 MED ORDER — ROCURONIUM BROMIDE 10 MG/ML (PF) SYRINGE
PREFILLED_SYRINGE | INTRAVENOUS | Status: DC | PRN
  Administered 2020-05-05: 50 mg via INTRAVENOUS
  Administered 2020-05-05: 70 mg via INTRAVENOUS
  Administered 2020-05-05: 50 mg via INTRAVENOUS
  Administered 2020-05-05 (×2): 30 mg via INTRAVENOUS
  Administered 2020-05-05 (×2): 50 mg via INTRAVENOUS

## 2020-05-05 MED ORDER — MAGNESIUM SULFATE 4 GM/100ML IV SOLN
4.0000 g | Freq: Once | INTRAVENOUS | Status: AC
  Administered 2020-05-05: 4 g via INTRAVENOUS
  Filled 2020-05-05: qty 100

## 2020-05-05 MED ORDER — PROTAMINE SULFATE 10 MG/ML IV SOLN
INTRAVENOUS | Status: AC
  Filled 2020-05-05: qty 5

## 2020-05-05 MED ORDER — SODIUM CHLORIDE (PF) 0.9 % IJ SOLN
OROMUCOSAL | Status: DC | PRN
  Administered 2020-05-05 (×3): 4 mL via TOPICAL

## 2020-05-05 MED ORDER — PROPOFOL 10 MG/ML IV BOLUS
INTRAVENOUS | Status: AC
  Filled 2020-05-05: qty 40

## 2020-05-05 MED ORDER — SODIUM CHLORIDE 0.9% FLUSH
10.0000 mL | INTRAVENOUS | Status: DC | PRN
  Administered 2020-05-06: 10 mL

## 2020-05-05 MED ORDER — DOPAMINE-DEXTROSE 3.2-5 MG/ML-% IV SOLN
INTRAVENOUS | Status: DC | PRN
Start: 2020-05-05 — End: 2020-05-05
  Administered 2020-05-05: 3 ug/kg/min via INTRAVENOUS

## 2020-05-05 MED ORDER — MIDAZOLAM HCL 2 MG/2ML IJ SOLN
2.0000 mg | INTRAMUSCULAR | Status: DC | PRN
  Administered 2020-05-05: 2 mg via INTRAVENOUS
  Filled 2020-05-05: qty 2

## 2020-05-05 MED ORDER — FENTANYL CITRATE (PF) 250 MCG/5ML IJ SOLN
INTRAMUSCULAR | Status: AC
  Filled 2020-05-05: qty 20

## 2020-05-05 SURGICAL SUPPLY — 85 items
BAG DECANTER FOR FLEXI CONT (MISCELLANEOUS) ×3 IMPLANT
BLADE CLIPPER SURG (BLADE) ×4 IMPLANT
BLADE STERNUM SYSTEM 6 (BLADE) ×3 IMPLANT
BLADE SURG 11 STRL SS (BLADE) ×1 IMPLANT
BNDG ELASTIC 4X5.8 VLCR STR LF (GAUZE/BANDAGES/DRESSINGS) ×3 IMPLANT
BNDG ELASTIC 6X10 VLCR STRL LF (GAUZE/BANDAGES/DRESSINGS) ×1 IMPLANT
BNDG ELASTIC 6X5.8 VLCR STR LF (GAUZE/BANDAGES/DRESSINGS) ×3 IMPLANT
BNDG GAUZE ELAST 4 BULKY (GAUZE/BANDAGES/DRESSINGS) ×3 IMPLANT
CANISTER SUCT 3000ML PPV (MISCELLANEOUS) ×3 IMPLANT
CANISTER WOUNDNEG PRESSURE 500 (CANNISTER) ×1 IMPLANT
CATH CPB KIT GERHARDT (MISCELLANEOUS) ×3 IMPLANT
CATH THORACIC 28FR (CATHETERS) ×3 IMPLANT
DEFOGGER ANTIFOG KIT (MISCELLANEOUS) ×1 IMPLANT
DERMABOND ADHESIVE PROPEN (GAUZE/BANDAGES/DRESSINGS) ×1
DERMABOND ADVANCED .7 DNX6 (GAUZE/BANDAGES/DRESSINGS) IMPLANT
DRAIN CHANNEL 28F RND 3/8 FF (WOUND CARE) ×3 IMPLANT
DRAPE CARDIOVASCULAR INCISE (DRAPES) ×2
DRAPE SLUSH/WARMER DISC (DRAPES) ×3 IMPLANT
DRAPE SRG 135X102X78XABS (DRAPES) ×2 IMPLANT
DRSG AQUACEL AG ADV 3.5X14 (GAUZE/BANDAGES/DRESSINGS) ×3 IMPLANT
ELECT BLADE 4.0 EZ CLEAN MEGAD (MISCELLANEOUS) ×3
ELECT REM PT RETURN 9FT ADLT (ELECTROSURGICAL) ×6
ELECTRODE BLDE 4.0 EZ CLN MEGD (MISCELLANEOUS) ×2 IMPLANT
ELECTRODE REM PT RTRN 9FT ADLT (ELECTROSURGICAL) ×4 IMPLANT
FELT TEFLON 1X6 (MISCELLANEOUS) ×6 IMPLANT
FILTER SMOKE EVAC ULPA (FILTER) ×3 IMPLANT
GAUZE SPONGE 4X4 12PLY STRL (GAUZE/BANDAGES/DRESSINGS) ×6 IMPLANT
GEL ULTRASOUND 8.5O AQUASONIC (MISCELLANEOUS) ×1 IMPLANT
GLOVE BIO SURGEON STRL SZ 6.5 (GLOVE) ×17 IMPLANT
GOWN STRL REUS W/ TWL LRG LVL3 (GOWN DISPOSABLE) ×8 IMPLANT
GOWN STRL REUS W/TWL LRG LVL3 (GOWN DISPOSABLE) ×10
HEMOSTAT POWDER SURGIFOAM 1G (HEMOSTASIS) ×9 IMPLANT
HEMOSTAT SURGICEL 2X14 (HEMOSTASIS) ×3 IMPLANT
KIT BASIN OR (CUSTOM PROCEDURE TRAY) ×3 IMPLANT
KIT CATH SUCT 8FR (CATHETERS) ×3 IMPLANT
KIT PREVENA INCISION MGT20CM45 (CANNISTER) ×1 IMPLANT
KIT SUCTION CATH 14FR (SUCTIONS) ×6 IMPLANT
KIT TURNOVER KIT B (KITS) ×3 IMPLANT
KIT VASOVIEW HEMOPRO 2 VH 4000 (KITS) ×3 IMPLANT
LEAD PACING MYOCARDI (MISCELLANEOUS) ×3 IMPLANT
MARKER GRAFT CORONARY BYPASS (MISCELLANEOUS) ×8 IMPLANT
NS IRRIG 1000ML POUR BTL (IV SOLUTION) ×16 IMPLANT
PACK E OPEN HEART (SUTURE) ×3 IMPLANT
PACK OPEN HEART (CUSTOM PROCEDURE TRAY) ×3 IMPLANT
PAD ARMBOARD 7.5X6 YLW CONV (MISCELLANEOUS) ×6 IMPLANT
PAD ELECT DEFIB RADIOL ZOLL (MISCELLANEOUS) ×3 IMPLANT
PENCIL BUTTON HOLSTER BLD 10FT (ELECTRODE) ×3 IMPLANT
PENCIL SMOKE EVACUATOR (MISCELLANEOUS) ×3 IMPLANT
POSITIONER HEAD DONUT 9IN (MISCELLANEOUS) ×3 IMPLANT
PUNCH AORTIC ROTATE  4.5MM 8IN (MISCELLANEOUS) ×1 IMPLANT
SENSOR MYOCARDIAL TEMP (MISCELLANEOUS) ×1 IMPLANT
SET CARDIOPLEGIA MPS 5001102 (MISCELLANEOUS) ×1 IMPLANT
SLEEVE SUCTION 125 (MISCELLANEOUS) ×3 IMPLANT
SPONGE LAP 18X18 RF (DISPOSABLE) ×1 IMPLANT
SPONGE LAP 18X18 X RAY DECT (DISPOSABLE) ×1 IMPLANT
SUT BONE WAX W31G (SUTURE) ×3 IMPLANT
SUT MNCRL AB 4-0 PS2 18 (SUTURE) ×1 IMPLANT
SUT PROLENE 3 0 SH DA (SUTURE) ×1 IMPLANT
SUT PROLENE 3 0 SH1 36 (SUTURE) ×3 IMPLANT
SUT PROLENE 4 0 RB 1 (SUTURE) ×3
SUT PROLENE 4 0 TF (SUTURE) ×6 IMPLANT
SUT PROLENE 4-0 RB1 .5 CRCL 36 (SUTURE) IMPLANT
SUT PROLENE 5 0 C 1 36 (SUTURE) ×2 IMPLANT
SUT PROLENE 6 0 C 1 30 (SUTURE) ×3 IMPLANT
SUT PROLENE 6 0 CC (SUTURE) ×7 IMPLANT
SUT PROLENE 7 0 BV 1 (SUTURE) ×3 IMPLANT
SUT PROLENE 7 0 BV1 MDA (SUTURE) ×4 IMPLANT
SUT PROLENE 7.0 RB 3 (SUTURE) ×1 IMPLANT
SUT PROLENE 8 0 BV175 6 (SUTURE) ×2 IMPLANT
SUT STEEL 6MS V (SUTURE) ×3 IMPLANT
SUT STEEL SZ 6 DBL 3X14 BALL (SUTURE) ×3 IMPLANT
SUT VIC AB 1 CTX 18 (SUTURE) ×6 IMPLANT
SUT VIC AB 2-0 CT1 27 (SUTURE) ×2
SUT VIC AB 2-0 CT1 TAPERPNT 27 (SUTURE) IMPLANT
SYSTEM SAHARA CHEST DRAIN ATS (WOUND CARE) ×3 IMPLANT
TAPE CLOTH SURG 4X10 WHT LF (GAUZE/BANDAGES/DRESSINGS) ×1 IMPLANT
TAPE PAPER 2X10 WHT MICROPORE (GAUZE/BANDAGES/DRESSINGS) ×1 IMPLANT
TAPE UMBILICAL COTTON 1/8X30 (MISCELLANEOUS) ×1 IMPLANT
TOWEL GREEN STERILE (TOWEL DISPOSABLE) ×3 IMPLANT
TOWEL GREEN STERILE FF (TOWEL DISPOSABLE) ×3 IMPLANT
TRAP FLUID SMOKE EVACUATOR (MISCELLANEOUS) ×3 IMPLANT
TRAY FOLEY SLVR 16FR TEMP STAT (SET/KITS/TRAYS/PACK) ×3 IMPLANT
TUBING LAP HI FLOW INSUFFLATIO (TUBING) ×3 IMPLANT
UNDERPAD 30X36 HEAVY ABSORB (UNDERPADS AND DIAPERS) ×3 IMPLANT
WATER STERILE IRR 1000ML POUR (IV SOLUTION) ×6 IMPLANT

## 2020-05-05 NOTE — Anesthesia Procedure Notes (Signed)
Arterial Line Insertion Start/End6/01/2020 7:50 AM, 05/05/2020 7:58 AM Performed by: Achille Rich, MD, Nils Pyle, CRNA  Patient location: Pre-op. Preanesthetic checklist: patient identified, IV checked, site marked, risks and benefits discussed, surgical consent, monitors and equipment checked, pre-op evaluation, timeout performed and anesthesia consent Lidocaine 1% used for infiltration radial was placed Catheter size: 20 G Hand hygiene performed , maximum sterile barriers used  and Seldinger technique used Allen's test indicative of satisfactory collateral circulation Attempts: 1 Procedure performed without using ultrasound guided technique. Following insertion, dressing applied and Biopatch. Post procedure assessment: normal  Patient tolerated the procedure well with no immediate complications. Additional procedure comments: Performed by Heide Scales, SRNA.

## 2020-05-05 NOTE — Progress Notes (Signed)
Echocardiogram Echocardiogram Transesophageal has been performed.  Ryan [name] 05/05/2020, 9:23 AM

## 2020-05-05 NOTE — Progress Notes (Signed)
EVENING ROUNDS NOTE :     [address]       [place] [address]             [phone]                 Day of Surgery Procedure(s) (LRB): CORONARY ARTERY BYPASS GRAFTING (CABG) x Three, Using Left internal Mammary Artery and Left Leg greater saphenous vein harvested endoscopically (N/A) TRANSESOPHAGEAL ECHOCARDIOGRAM (TEE) (N/A)   Total Length of Stay:  LOS: 0 days  Events:   On levo at 4 and milrinone at 0.3 Minimal chest tube output Good hemodynamics Waking up slowly.    BP 107/76   Pulse 92   Temp (!) 97.5 F (36.4 C)   Resp (!) 21   Ht 5\' 9"  (1.753 m)   Wt 97.1 kg   SpO2 98%   BMI 31.60 kg/m   PAP: (27-32)/(21-25) 31/24 CO:  [4.1 L/min-5.3 L/min] 5.3 L/min CI:  [1.9 L/min/m2-2.5 L/min/m2] 2.5 L/min/m2  Vent Mode: SIMV;PRVC;PSV FiO2 (%):  [50 %] 50 % Set Rate:  [12 bmp-16 bmp] 16 bmp Vt Set:  [560 mL-700 mL] 700 mL PEEP:  [5 cmH20] 5 cmH20 Pressure Support:  [10 cmH20] 10 cmH20 Plateau Pressure:  [18 cmH20] 18 cmH20  . sodium chloride 10 mL/hr at 05/05/20 1600  . [START ON 05/06/2020] sodium chloride    . sodium chloride 10 mL/hr at 05/05/20 1533  . albumin human 12.5 g (05/05/20 1625)  . cefUROXime (ZINACEF)  IV    . dexmedetomidine (PRECEDEX) IV infusion 0.7 mcg/kg/hr (05/05/20 1609)  . DOPamine    . famotidine (PEPCID) IV 20 mg (05/05/20 1530)  . insulin 6 mL/hr at 05/05/20 1600  . lactated ringers    . lactated ringers 20 mL/hr at 05/05/20 1600  . lactated ringers    . magnesium sulfate 20 mL/hr at 05/05/20 1600  . milrinone 0.2 mcg/kg/min (05/05/20 1600)  . nitroGLYCERIN 16.667 mcg/min (05/05/20 1711)  . norepinephrine (LEVOPHED) Adult infusion 6 mcg/min (05/05/20 1636)  . phenylephrine (NEO-SYNEPHRINE) Adult infusion    . potassium chloride    . vancomycin      No intake/output data recorded.   CBC Latest Ref Rng & Units 05/05/2020 05/05/2020 05/05/2020  WBC 4.0 - 10.5 K/uL - - -  Hemoglobin 13.0 - 17.0 g/dL 07/05/2020 10.9 32.3    Hematocrit 39.0 - 52.0 % 40.0 43.0 43.0  Platelets 150 - 400 K/uL - - -    BMP Latest Ref Rng & Units 05/05/2020 05/05/2020 05/05/2020  Glucose 70 - 99 mg/dL - - -  BUN 6 - 20 mg/dL - - -  Creatinine 07/05/2020 - 1.24 mg/dL - - -  BUN/Creat Ratio 9 - 20 - - -  Sodium 135 - 145 mmol/L 141 141 141  Potassium 3.5 - 5.1 mmol/L 4.2 4.1 4.2  Chloride 98 - 111 mmol/L - - -  CO2 22 - 32 mmol/L - - -  Calcium 8.9 - 10.3 mg/dL - - -    ABG    Component Value Date/Time   PHART 7.328 (L) 05/05/2020 1642   PCO2ART 44.8 05/05/2020 1642   PO2ART 91 05/05/2020 1642   HCO3 23.5 05/05/2020 1642   TCO2 25 05/05/2020 1642   ACIDBASEDEF 3.0 (H) 05/05/2020 1642   O2SAT 96.0 05/05/2020 1642       07/05/2020, MD 05/05/2020 5:37 PM

## 2020-05-05 NOTE — Progress Notes (Signed)
Patient ID: Ryan [name], male   DOB: 08-23-1966, 54 y.o.   MRN: [id number]      [address]       [place] [address]             [id number]                    Ryan [name] [facility] Medical Record #[id number] Date of Birth: 01-21-1966  Referring: No ref. provider found Primary Care: Nolene Ebbs, MD Primary Cardiologist: [name], MD  Chief Complaint:    No chief complaint on file.   History of Present Illness:    Ryan [name] 54 y.o. male is seen in the office  today for consideration of CABG. the patient is currently on Brilinta and aspirin and Imdur.  Because of recurrent anginal symptoms and need for as needed nitroglycerin several times a week he underwent repeat cardiac catheterization as an outpatient last week.  He comes into the office today referred for consideration of coronary artery bypass grafting,    He has a history of on going tobacco abuse, CAD, HLD, PAD, HLD, ischemic cardiomyopathy and insulin dependent diabetes mellitus  Previous history includes an anterior STEMI in January 2013 with cardiac arrest and found to have an occluded mid LAD treated with a drug eluting stent. The third OM branch was chronically occluded and the RCA had mild disease. His post MI LVEF was 35% but this was found to be normal on follow up echo.   Cardiac cath in November 2017 with severe stent restenosis in the LAD, treated with a cutting balloon angioplasty.    Admitted to [facility] in August 2018 with an anterior STEMI. His LAD was occluded proximal to the old stent and this was treated with a drug eluting stent. Angioplasty performed on the ostial Diagonal branch. LVEF was 40% following the MI.   He had unstable angina in June 2020 and repeat cardiac cath showed severe restenosis in the mid LAD stented segment which was treated with cutting balloon angioplasty. He also had ostialstenosisof the diagonal branch (jailed by LAD stents),  mild to moderate non-obstructive disease in the mid RCAand moderate LV systolic dysfunction with anteroapical hypokinesis. LVEF=35-45%.He was placed on DAPT w/ ASA andBrilintaand continued on statin, ?blocker, ACEi and long acting nitrate   He has a history of severe peripheral vascular and cerebrovascular disease is followed  by Dr. Trula Slade. In 2013 he was found to have occlusion of his distal aorta and left subclavian artery. He underwent aortobifemoral bypass in February 2014 and left carotid to subclavian bypass in May 2014.  InApril2019,he underwent right femoral to AK popliteal bypass with saphenous vein and right femoral endarterectomy   Current Activity/ Functional Status:  Patient is independent with mobility/ambulation, transfers, ADL's, IADL's.   Zubrod Score: At the time of surgery this patient's most appropriate activity status/level should be described as: []     0    Normal activity, no symptoms [x]     1    Restricted in physical strenuous activity but ambulatory, able to do out light work []     2    Ambulatory and capable of self care, unable to do work activities, up and about               >50 % of waking hours                              []   3    Only limited self care, in bed greater than 50% of waking hours []     4    Completely disabled, no self care, confined to bed or chair []     5    Moribund   Past Medical History:  Diagnosis Date  . Acute myocardial infarction of other anterior wall, subsequent episode of care 12/12/2011  . Anal fissure   . Arthritis    back  . Atherosclerosis of native artery of both lower extremities with intermittent claudication (HCC) 04/01/2018  . Bruises easily    d/t being on Effient  . CAD (coronary artery disease)    A. Acute Ant STEMI 12/12/2011   . Cardiomyopathy secondary 12/26/2011  . Chronic total occlusion of artery of the extremities (HCC) 02/26/2012  . Claudication (HCC) 12/26/2011  . Coronary atherosclerosis of native  coronary artery    a. ant STEMI with cardiac arrest 2013 s/p DES to mLAD. b. 12/28/2011 10/2016 s/p PTCA to mLAD.  . Diabetes mellitus without complication (HCC)   . Essential hypertension 10/23/2016  . GERD (gastroesophageal reflux disease)    "takes tums"  . History of blood transfusion    no abnormal reaction noted  . History of kidney stones   . Hypertension   . Ischemic cardiomyopathy    a. EF 35% in 02/2012 at time of acute MI, improved to normal on subsequent imaging.  . MI (myocardial infarction) (HCC)    AMI 1/13 - complicated by VT/Tosades  . Mixed hyperlipidemia   . Numbness and tingling of right arm 03/31/2013  . Occlusion and stenosis of carotid artery without mention of cerebral infarction 08/26/2012  . PAD (peripheral artery disease) (HCC)    followed by Vascular - aortobifem bypass 01/2013 & left carotid-subclavian artery bypass 04/2013  . Peripheral vascular disease, unspecified (HCC) 12/16/2012  . PVD (peripheral vascular disease) (HCC) 03/31/2013  . Rectal polyp   . Subclavian steal syndrome 08/11/2013  . Uncontrolled diabetes mellitus (HCC) 10/23/2016  . Wears partial dentures    upper    Past Surgical History:  Procedure Laterality Date  . ABDOMINAL AORTOGRAM W/LOWER EXTREMITY N/A 06/26/2017   Procedure: Abdominal Aortogram w/Lower Extremity;  Surgeon: 10/25/2016, MD;  Location: [facility] INVASIVE CV LAB;  Service: Cardiovascular;  Laterality: N/A;  . ANAL FISSURECTOMY    . AORTA - BILATERAL FEMORAL ARTERY BYPASS GRAFT N/A 01/16/2013   Procedure: AORTA BIFEMORAL BYPASS GRAFT;  Surgeon: Nada Libman, MD;  Location: [facility] OR;  Service: Vascular;  Laterality: N/A;  . APPENDECTOMY    . CARDIAC CATHETERIZATION N/A 10/23/2016   Procedure: Left Heart Cath and Coronary Angiography;  Surgeon: Nada Libman, MD;  Location: [facility] INVASIVE CV LAB;  Service: Cardiovascular;  Laterality: N/A;  . CARDIAC CATHETERIZATION N/A 10/23/2016   Procedure: Coronary Balloon Angioplasty;   Surgeon: [facility], MD;  Location: [facility] INVAS[facility] CV LAB;  Service: Cardiovascular;  Laterality: N/A;  . CAROTID-SUBCLAVIAN BYPASS GRAFT Left 04/03/2013   Procedure: BYPASS GRAFT CAROTID-SUBCLAVIAN;  Surgeon: Kathleene Hazel, MD;  Location: [facility] OR;  Service: Vascular;  Laterality: Left;  . CIRCUMCISION N/A 12/15/2015   Procedure: CIRCUMCISION ADULT;  Surgeon: Nada Libman, MD;  Location: [facility]S;  Service: Urology;  Laterality: N/A;  . COLONOSCOPY    . CORONARY ANGIOPLASTY     stent placed Dec 12, 2011 and 2018  . CORONARY BALLOON ANGIOPLASTY N/A 05/23/2019   Procedure: CORONARY BALLOON ANGIOPLASTY;  Surgeon: 2019, MD;  Location: [facility] INVASIVE  CV LAB;  Service: Cardiovascular;  Laterality: N/A;  . DIAGNOSTIC LAPAROSCOPY    . ENDARTERECTOMY FEMORAL Right 03/13/2018   Procedure: FEMORAL ENDARTERECTOMY RIGHT;  Surgeon: Nada Libman, MD;  Location: [facility] OR;  Service: Vascular;  Laterality: Right;  . FEMORAL-POPLITEAL BYPASS GRAFT Right 03/13/2018   Procedure: FEMORAL-POPLITEAL ARTERY BYPASS WITH NON-REVERSE VEIN RIGHT;  Surgeon: Nada Libman, MD;  Location: [facility] OR;  Service: Vascular;  Laterality: Right;  . GROIN DISSECTION Right 03/13/2018   Procedure: RE-DO COMMON FEMORAL ARTERY EXPOSURE;  Surgeon: Nada Libman, MD;  Location: [facility] OR;  Service: Vascular;  Laterality: Right;  . I & D EXTREMITY Left 04/18/2013   Procedure: debridement of left neck lymphocele;  Surgeon: Nada Libman, MD;  Location: [facility] OR;  Service: Vascular;  Laterality: Left;  I and D of left neck  . I & D EXTREMITY Right 11/21/2014   Procedure: IRRIGATION AND DEBRIDEMENT RIGHT HAND;  Surgeon: Dominica Severin, MD;  Location: [facility] OR;  Service: Orthopedics;  Laterality: Right;  . INTRAVASCULAR PRESSURE WIRE/FFR STUDY N/A 04/22/2020   Procedure: INTRAVASCULAR PRESSURE WIRE/FFR STUDY;  Surgeon: Kathleene Hazel, MD;  Location: [facility] CV LAB;  Service: Cardiovascular;  Laterality: N/A;  . LEFT  HEART CATH AND CORONARY ANGIOGRAPHY N/A 05/23/2019   Procedure: LEFT HEART CATH AND CORONARY ANGIOGRAPHY;  Surgeon: Kathleene Hazel, MD;  Location: [facility]IVE CV LAB;  Service: Cardiovascular;  Laterality: N/A;  . LEFT HEART CATH AND CORONARY ANGIOGRAPHY N/A 04/22/2020   Procedure: LEFT HEART CATH AND CORONARY ANGIOGRAPHY;  Surgeon: Kathleene Hazel, MD;  Location: [facility] INVASIVE CV LAB;  Service: Cardiovascular;  Laterality: N/A;  . LEFT HEART CATHETERIZATION WITH CORONARY ANGIOGRAM N/A 12/12/2011   Procedure: LEFT HEART CATHETERIZATION WITH CORONARY ANGIOGRAM;  Surgeon: Kathleene Hazel, MD;  Location: [facility] CATH LAB;  Service: Cardiovascular;  Laterality: N/A;  . LOWER EXTREMITY ANGIOGRAM N/A 01/31/2012   Procedure: LOWER EXTREMITY ANGIOGRAM;  Surgeon: Kathleene Hazel, MD;  Location: [facility] CATH LAB;  Service: Cardiovascular;  Laterality: N/A;  . LOWER EXTREMITY ANGIOGRAPHY N/A 04/23/2018   Procedure: LOWER EXTREMITY ANGIOGRAPHY;  Surgeon: Nada Libman, MD;  Location: [facility] CV [facility];  Service: Cardiovascular;  Laterality: N/A;  . PERCUTANEOUS CORONARY STENT INTERVENTION (PCI-S) Right 12/12/2011   Procedure: PERCUTANEOUS CORONARY STENT INTERVENTION (PCI-S);  Surgeon: Kathleene Hazel, MD;  Location: [facility] CATH LAB;  Service: Cardiovascular;  Laterality: Right;  . PERIPHERAL VASCULAR INTERVENTION Left 04/23/2018   Procedure: PERIPHERAL VASCULAR INTERVENTION;  Surgeon: Nada Libman, MD;  Location: [facility] INVASIVE CV LAB;  Service: Cardiovascular;  Laterality: Left;  superficial femoral  . REPAIR EXTENSOR TENDON Right 11/21/2014   Procedure: WITH REPAIR/RECONSTRUCTION OF EXTENSOR TENDONS AS NEEDED;  Surgeon: Dominica Severin, MD;  Location: [facility] OR;  Service: Orthopedics;  Laterality: Right;    Family History  Problem Relation Age of Onset  . Cancer Father        Lung  . Diabetes Mother   . COPD Mother   . Cancer Maternal Uncle        Colon     Social History   Tobacco  Use  Smoking Status Current Every Day Smoker  . Packs/day: 0.25  . Years: 25.00  . Pack years: 6.25  . Types: Cigarettes  Smokeless Tobacco Never Used  Tobacco Comment   1-2 cigs a day    Social History   Substance and Sexual Activity  Alcohol Use Yes  . Alcohol/week: 4.0 standard drinks  . Types: 4  Cans of beer per week     Allergies  Allergen Reactions  . Hydrocodone Hives and Nausea And Vomiting    Current Facility-Administered Medications  Medication Dose Route Frequency Provider Last Rate Last Admin  . cefUROXime (ZINACEF) 1.5 g in sodium chloride 0.9 % 100 mL IVPB  1.5 g Intravenous To OR Delight Ovens, MD      . cefUROXime (ZINACEF) 750 mg in sodium chloride 0.9 % 100 mL IVPB  750 mg Intravenous To OR Delight Ovens, MD      . chlorhexidine (HIBICLENS) 4 % liquid 2 application  30 mL Topical UD Delight Ovens, MD      . dexmedetomidine (PRECEDEX) 400 MCG/100ML (4 mcg/mL) infusion  0.1-0.7 mcg/kg/hr Intravenous To OR Delight Ovens, MD      . EPINEPHrine (ADRENALIN) 4 mg in NS 250 mL (0.016 mg/mL) premix infusion  0-10 mcg/min Intravenous To OR Delight Ovens, MD      . heparin 30,000 units/NS 1000 mL solution for CELLSAVER   Other To OR Delight Ovens, MD      . heparin sodium (porcine) 2,500 Units, papaverine 30 mg in electrolyte-148 (PLASMALYTE-148) 500 mL irrigation   Irrigation To OR Delight Ovens, MD      . insulin regular, human (MYXREDLIN) 100 units/ 100 mL infusion   Intravenous To OR Delight Ovens, MD      . magnesium sulfate (IV Push/IM) injection 40 mEq  40 mEq Other To OR Delight Ovens, MD      . metoprolol tartrate (LOPRESSOR) tablet 12.5 mg  12.5 mg Oral Once Delight Ovens, MD      . milrinone (PRIMACOR) 20 MG/100 ML (0.2 mg/mL) infusion  0.3 mcg/kg/min Intravenous To OR Delight Ovens, MD      . nitroGLYCERIN 50 mg in dextrose 5 % 250 mL (0.2 mg/mL) infusion  2-200 mcg/min Intravenous To OR Delight Ovens,  MD      . norepinephrine (LEVOPHED) 4mg  in premix infusion  0-40 mcg/min Intravenous To OR Delight Ovens, MD      . phenylephrine (NEOSYNEPHRINE) 20-0.9 MG/250ML-% infusion  30-200 mcg/min Intravenous To OR Delight Ovens, MD      . potassium chloride injection 80 mEq  80 mEq Other To OR Delight Ovens, MD      . tranexamic acid (CYKLOKAPRON) 2,500 mg in sodium chloride 0.9 % 250 mL (10 mg/mL) infusion  1.5 mg/kg/hr Intravenous To OR Delight Ovens, MD      . tranexamic acid (CYKLOKAPRON) bolus via infusion - over 30 minutes 1,462.5 mg  15 mg/kg Intravenous To OR Delight Ovens, MD      . tranexamic acid (CYKLOKAPRON) pump prime solution 195 mg  2 mg/kg Intracatheter To OR Delight Ovens, MD      . vancomycin (VANCOREADY) IVPB 1500 mg/300 mL  1,500 mg Intravenous To OR Delight Ovens, MD        Pertinent items are noted in HPI.   Review of Systems:     Cardiac Review of Systems: [Y] = yes  or   [ N ] = no   Chest Pain [ y   ]  Resting SOB [  n ] Exertional SOB  Cove.Etienne  ]  Orthopnea [ n ]   Pedal Edema [ n  ]    Palpitations [ n ] Syncope  [  n]   Presyncope [ n  ]   General  Review of Systems: [Y] = yes [  ]=no Constitional: recent weight change [  ];  Wt loss over the last 3 months [   ] anorexia [  ]; fatigue [  ]; nausea [  ]; night sweats [  ]; fever [  ]; or chills [  ];           Eye : blurred vision [  ]; diplopia [   ]; vision changes [  ];  Amaurosis fugax[  ]; Resp: cough [ y ];  wheezing[  ];  hemoptysis[  ]; shortness of breath[  ]; paroxysmal nocturnal dyspnea[  ]; dyspnea on exertion[  ]; or orthopnea[  ];  GI:  gallstones[  ], vomiting[  ];  dysphagia[  ]; melena[  ];  hematochezia [  ]; heartburn[  ];   Hx of  Colonoscopy[  ]; GU: kidney stones [  ]; hematuria[  ];   dysuria [  ];  nocturia[  ];  history of     obstruction [  ]; urinary frequency [  ]             Skin: rash, swelling[  ];, hair loss[  ];  peripheral edema[  ];  or itching[   ]; Musculosketetal: myalgias[  ];  joint swelling[  ];  joint erythema[  ];  joint pain[  ];  back pain[  ];  Heme/Lymph: bruising[  ];  bleeding[  ];  anemia[  ];  Neuro: TIA[  ];  headaches[  ];  stroke[  ];  vertigo[  ];  seizures[  ];   paresthesias[ y ];  difficulty walking[  ];  Psych:depression[  ]; anxiety[  ];  Endocrine: diabetes[y  ];  thyroid dysfunction[  ];  Immunizations: Flu up to date [ y ]; Pneumococcal up to date [ n ];  Other:no covid vacination     PHYSICAL EXAMINATION: BP (!) 110/57   Pulse 81   Temp 98.1 F (36.7 C) (Oral)   Resp 18   Ht 5\' 9"  (1.753 m)   Wt 97.1 kg   SpO2 97%   BMI 31.60 kg/m  General appearance: alert, cooperative, appears older than stated age and no distress Head: Normocephalic, without obvious abnormality, atraumatic Neck: no adenopathy, no carotid bruit, no JVD, supple, symmetrical, trachea midline and thyroid not enlarged, symmetric, no tenderness/mass/nodules Lymph nodes: Cervical, supraclavicular, and axillary nodes normal. Resp: clear to auscultation bilaterally Cardio: regular rate and rhythm, S1, S2 normal, no murmur, click, rub or gallop GI: soft, non-tender; bowel sounds normal; no masses,  no organomegaly and Midline abdominal scar from previous aortobifem Extremities: extremities normal, atraumatic, no cyanosis or edema and Homans sign is negative, no sign of DVT Neurologic: Grossly normal Patient has left lower neck incision from previous carotid subclavian bypass, left arm radial pulse is strong, patient has palpable DP PT pulses bilaterally the feet.  Diagnostic Studies & Laboratory data:     Recent Radiology Findings:   DG Chest 2 View  Result Date: 04/30/2020 CLINICAL DATA:  Preoperative study. EXAM: CHEST - 2 VIEW COMPARISON:  Chest x-ray 01/16/2013. FINDINGS: Mediastinum and hilar structures normal. Borderline cardiomegaly and pulmonary venous congestion. Tiny calcified pulmonary nodules noted consistent granulomas  or prior viral infection. No focal infiltrate. No pleural effusion or pneumothorax. Degenerative change thoracic spine. IMPRESSION: 1.  Borderline cardiomegaly and pulmonary venous congestion. 2.  No acute pulmonary disease. Electronically Signed   By: 01/18/2013  Register   On: 04/30/2020 14:09  CARDIAC CATHETERIZATION  Result Date: 04/22/2020  Ost 3rd Mrg to 3rd Mrg lesion is 100% stenosed.  Mid RCA lesion is 60% stenosed.  3rd Mrg lesion is 100% stenosed.  Prox LAD to Mid LAD lesion is 80% stenosed.  1st Diag lesion is 80% stenosed.  The left ventricular ejection fraction is 25-35% by visual estimate.  There is moderate left ventricular systolic dysfunction.  There is no mitral valve regurgitation.  1. Severe restenosis mid LAD stented segment 2. Severe stenosis ostium of the Diagonal branch, jailed by the stent 3. Chronic occlusion of the third OM branch, filling from collaterals 4. Moderate mid RCA stenosis. 5. Moderate segmental LV systolic dysfunction Recommendations: He has recurrence of stenosis in the mid LAD stented segment involving a large diagonal branch. This area was initially stented in the setting of an anterior MI due to vessel occlusion in 2013 and then restenosis was treated with cutting balloon angioplasty in 2017. He then had another anterior STEMI due to stent thrombosis/restenosis in 2018 and had another drug eluting stent placed at that time overlapping the prior stent. I then treated restenosis in the stented segment in 2020. With the recurrence of disease in the stented segment and his diabetes, I think the best treatment option is CABG. LAD severity demonstrated with pressure wire. Further stenting of the LAD would likely compromise flow into the large Diagonal branch. Will continue ASA/brilinta/statin and beta blocker and refer as an outpatient to CT surgery for CABG.   ECHOCARDIOGRAM COMPLETE  Result Date: 05/04/2020    ECHOCARDIOGRAM REPORT   Patient Name:   Ryan [name]  Date of Exam: 05/04/2020 Medical Rec #:  [id number]    Height:       69.0 in Accession #:    [id number]   Weight:       214.9 lb Date of Birth:  06-22-1966   BSA:          2.130 m Patient Age:    53 years     BP:           105/76 mmHg Patient Gender: M            HR:           89 bpm. Exam Location:  Outpatient Procedure: 2D Echo, Cardiac Doppler and Color Doppler Indications:    Pre-op evaluation                 CAD (coronary artery disease)  History:        Patient has prior history of Echocardiogram examinations, most                 recent 02/16/2012. Cardiomyopathy, Acute MI and CAD,                 Signs/Symptoms:Chest Pain; Risk Factors:Hypertension,                 Dyslipidemia and Current Smoker. PVD. PAD.  Sonographer:    Renella Cunas RDCS Referring Phys: [phone] Ohanna Gassert B Kinley Dozier IMPRESSIONS  1. The mid septum, apical septum, and apex segments are akinetic. Would recommend a limited contrast echo to exclude apical thrombus as this is not well visualized on the current study. EF 40-45%. Findings consistent with prior LAD infarction.  2. Left ventricular ejection fraction, by estimation, is 40 to 45%. The left ventricle has mildly decreased function. The left ventricle demonstrates regional wall motion abnormalities (see scoring diagram/findings for description). Left ventricular diastolic parameters are consistent with Grade III  diastolic dysfunction (restrictive).  3. Right ventricular systolic function is normal. The right ventricular size is normal. Tricuspid regurgitation signal is inadequate for assessing PA pressure.  4. The mitral valve is grossly normal. Trivial mitral valve regurgitation. No evidence of mitral stenosis.  5. The aortic valve is tricuspid. Aortic valve regurgitation is not visualized. Mild aortic valve sclerosis is present, with no evidence of aortic valve stenosis. Comparison(s): Changes from prior study are noted. Conclusion(s)/Recommendation(s): Findings consistent with ischemic  cardiomyopathy. The mid septum, apical septum, and apex segments are akinetic. Would recommend a limited contrast echo to exclude apical thrombus as this is not well visualized on the current study. FINDINGS  Left Ventricle: Left ventricular ejection fraction, by estimation, is 40 to 45%. The left ventricle has mildly decreased function. The left ventricle demonstrates regional wall motion abnormalities. The left ventricular internal cavity size was normal in size. There is no left ventricular hypertrophy. Left ventricular diastolic parameters are consistent with Grade III diastolic dysfunction (restrictive).  LV Wall Scoring: The mid and distal anterior septum and apex are akinetic. Right Ventricle: The right ventricular size is normal. No increase in right ventricular wall thickness. Right ventricular systolic function is normal. Tricuspid regurgitation signal is inadequate for assessing PA pressure. Left Atrium: Left atrial size was normal in size. Right Atrium: Right atrial size was normal in size. Pericardium: There is no evidence of pericardial effusion. Mitral Valve: The mitral valve is grossly normal. Trivial mitral valve regurgitation. No evidence of mitral valve stenosis. Tricuspid Valve: The tricuspid valve is grossly normal. Tricuspid valve regurgitation is trivial. No evidence of tricuspid stenosis. Aortic Valve: The aortic valve is tricuspid. Aortic valve regurgitation is not visualized. Mild aortic valve sclerosis is present, with no evidence of aortic valve stenosis. Pulmonic Valve: The pulmonic valve was grossly normal. Pulmonic valve regurgitation is not visualized. No evidence of pulmonic stenosis. Aorta: The aortic root is normal in size and structure. IAS/Shunts: The atrial septum is grossly normal.  LEFT VENTRICLE PLAX 2D LVIDd:         5.50 cm      Diastology LVIDs:         4.60 cm      LV e' lateral:   9.14 cm/s LV PW:         0.70 cm      LV E/e' lateral: 10.2 LV IVS:        0.70 cm      LV  e' medial:    7.40 cm/s LVOT diam:     2.10 cm      LV E/e' medial:  12.6 LV SV:         52 LV SV Index:   24 LVOT Area:     3.46 cm  LV Volumes (MOD) LV vol d, MOD A2C: 138.0 ml LV vol d, MOD A4C: 132.0 ml LV vol s, MOD A2C: 80.0 ml LV vol s, MOD A4C: 80.9 ml LV SV MOD A2C:     58.0 ml LV SV MOD A4C:     132.0 ml LV SV MOD BP:      55.1 ml RIGHT VENTRICLE RV S prime:     12.30 cm/s TAPSE (M-mode): 2.0 cm LEFT ATRIUM           Index       RIGHT ATRIUM           Index LA diam:      3.80 cm 1.78 cm/m  RA Area:  12.20 cm LA Vol (A2C): 21.8 ml 10.23 ml/m RA Volume:   26.20 ml  12.30 ml/m LA Vol (A4C): 41.3 ml 19.39 ml/m  AORTIC VALVE LVOT Vmax:   84.20 cm/s LVOT Vmean:  57.000 cm/s LVOT VTI:    0.150 m  AORTA Ao Root diam: 2.80 cm MITRAL VALVE MV Area (PHT): 6.12 cm    SHUNTS MV Decel Time: 124 msec    Systemic VTI:  0.15 m MV E velocity: 93.40 cm/s  Systemic Diam: 2.10 cm MV A velocity: 43.30 cm/s MV E/A ratio:  2.16 Lennie Odor MD Electronically signed by Lennie Odor MD Signature Date/Time: 05/04/2020/5:31:05 PM    Final    VAS US DOPPLER PRE CABG  Result Date: 05/04/2020 PREOPERATIVE VASCULAR EVALUATION  Indications:  Pre-CABG. Risk Factors: Hypertension, hyperlipidemia, Diabetes, coronary artery disease,               PAD. Performing Technologist: Blanch Media RVS  Examination Guidelines: A complete evaluation includes B-mode imaging, spectral Doppler, color Doppler, and power Doppler as needed of all accessible portions of each vessel. Bilateral testing is considered an integral part of a complete examination. Limited examinations for reoccurring indications may be performed as noted.  Right Carotid Findings: +----------+--------+--------+--------+------------+--------+           PSV cm/sEDV cm/sStenosisDescribe    Comments +----------+--------+--------+--------+------------+--------+ CCA Prox  106     21              heterogenous          +----------+--------+--------+--------+------------+--------+ CCA Distal68      22              heterogenous         +----------+--------+--------+--------+------------+--------+ ICA Prox  87      34      1-39%   heterogenous         +----------+--------+--------+--------+------------+--------+ ICA Distal76      32                                   +----------+--------+--------+--------+------------+--------+ ECA       144     16                                   +----------+--------+--------+--------+------------+--------+ Portions of this table do not appear on this page. +----------+--------+-------+--------+------------+           PSV cm/sEDV cmsDescribeArm Pressure +----------+--------+-------+--------+------------+ Subclavian135                                 +----------+--------+-------+--------+------------+ +---------+--------+--+--------+--+---------+ VertebralPSV cm/s35EDV cm/s15Antegrade +---------+--------+--+--------+--+---------+ Left Carotid Findings: +----------+--------+--------+--------+------------+--------+           PSV cm/sEDV cm/sStenosisDescribe    Comments +----------+--------+--------+--------+------------+--------+ CCA Prox  147     30              heterogenous         +----------+--------+--------+--------+------------+--------+ CCA Distal81      30              heterogenous         +----------+--------+--------+--------+------------+--------+ ICA Prox  86      48      1-39%   heterogenous         +----------+--------+--------+--------+------------+--------+ ICA Distal83      41                                   +----------+--------+--------+--------+------------+--------+  ECA       137     27                                   +----------+--------+--------+--------+------------+--------+ +----------+--------+--------+--------+------------+ SubclavianPSV cm/sEDV cm/sDescribeArm Pressure  +----------+--------+--------+--------+------------+           123                                  +----------+--------+--------+--------+------------+ +---------+--------+--+--------+--+---------+ VertebralPSV cm/s58EDV cm/s14Antegrade +---------+--------+--+--------+--+---------+  ABI Findings: +--------+------------------+-----+---------+--------+ Right   Rt Pressure (mmHg)IndexWaveform Comment  +--------+------------------+-----+---------+--------+ [id number]                    triphasic         +--------+------------------+-----+---------+--------+ PTA     142               0.95 triphasic         +--------+------------------+-----+---------+--------+ DP      136               0.91 triphasic         +--------+------------------+-----+---------+--------+ +--------+------------------+-----+---------+-------+ Left    Lt Pressure (mmHg)IndexWaveform Comment +--------+------------------+-----+---------+-------+ [id number]                    triphasic        +--------+------------------+-----+---------+-------+ PTA     157               1.05 triphasic        +--------+------------------+-----+---------+-------+ DP      145               0.97 triphasic        +--------+------------------+-----+---------+-------+ +-------+---------------+----------------+ ABI/TBIToday's ABI/TBIPrevious ABI/TBI +-------+---------------+----------------+ Right  0.95                            +-------+---------------+----------------+ Left   1.05                            +-------+---------------+----------------+  Right Doppler Findings: +--------+--------+-----+---------+--------+ Site    PressureIndexDoppler  Comments +--------+--------+-----+---------+--------+ [id number]          triphasic         +--------+--------+-----+---------+--------+ Radial               triphasic         +--------+--------+-----+---------+--------+ Ulnar                 triphasic         +--------+--------+-----+---------+--------+  Left Doppler Findings: +--------+--------+-----+---------+--------+ Site    PressureIndexDoppler  Comments +--------+--------+-----+---------+--------+ [id number]          triphasic         +--------+--------+-----+---------+--------+ Radial               triphasic         +--------+--------+-----+---------+--------+ Ulnar                triphasic         +--------+--------+-----+---------+--------+  Summary: Right Carotid: Velocities in the right ICA are consistent with a 1-39% stenosis. Left Carotid: Velocities in the left ICA are consistent with a 1-39% stenosis. Vertebrals: Bilateral vertebral arteries demonstrate antegrade flow. Right ABI: Resting right ankle-brachial index is within normal range. No evidence of significant right lower extremity  arterial disease. Left ABI: Resting left ankle-brachial index is within normal range. No evidence of significant left lower extremity arterial disease. Right Upper Extremity: Doppler waveforms remain within normal limits with right radial compression. Doppler waveforms remain within normal limits with right ulnar compression. Left Upper Extremity: Doppler waveforms remain within normal limits with left radial compression. Doppler waveforms decrease 50% with left ulnar compression.  Electronically signed by Waverly Ferrari MD on 05/04/2020 at 6:07:48 PM.    Final      I have independently reviewed the above radiology studies  and reviewed the findings with the patient.   Recent Lab Findings: Lab Results  Component Value Date   WBC 9.6 04/30/2020   HGB 16.3 04/30/2020   HCT 48.9 04/30/2020   PLT 199 04/30/2020   GLUCOSE 129 (H) 04/30/2020   CHOL 180 11/09/2017   TRIG 299 (H) 11/09/2017   HDL 31 (L) 11/09/2017   LDLCALC 89 11/09/2017   ALT 48 (H) 04/30/2020   AST 35 04/30/2020   NA 136 04/30/2020   K 4.2 04/30/2020   CL 104 04/30/2020   CREATININE  0.91 04/30/2020   BUN 15 04/30/2020   CO2 23 04/30/2020   TSH 0.832 01/18/2013   INR 1.0 04/30/2020   HGBA1C 11.8 (H) 04/30/2020   Last echo in the [facility] system 2013   Transthoracic Echocardiography Report (TTE)  Demographics  Patient Name   Ryan [name]  Gender        Male  Patient Number  [id number]  Race         Unknown  Visit Number   [id number]  Room Number      470  Accession Number [id number] Date of Study     07/31/2017  Date of Birth   Feb 27, 1966   Referring Physician  Holley Raring, MD  Age        56 year(s)   Sonographer      Rashidi Pati Gallo, RCS                  Interpreting     Holley Raring, MD                  Physician Procedure Type of Study  TTE procedure: ECHOCARDIOGRAM W COLORFLOW SPECTRAL DOPPLER. Procedure date Date: 07/31/2017 Start: 04:26 PM Technical Quality: Adequate visualization Study Location: Portable Indications: Chest pain and status Post Surgical. Patient Status: Routine Height: 68.9 inches Weight: 209.44 pounds BSA: 2.1 m2 BMI: 31.02 kg/m2 BP: 147/84 mmHg Conclusions Summary Distal anterior, Anteroapical/apical wall hypokinesis LVEF 35-40% No significant valvular abnormalites noted Signature ------------------------------------------------------------------------------  Electronically signed by Holley Raring, MD(Interpreting physician) on 07/31/2017  05:19 PM ------------------------------------------------------------------------------ Findings Mitral Valve Structurally normal mitral valve with good mobility and no significant regurgitation. Aortic Valve Structurally normal aortic valve with good leaflet mobility, and no regurgitation. Tricuspid Valve Tricuspid valve is structurally normal. No significant tricuspid regurgitation. Pulmonic Valve The pulmonic valve was not well visualized No Doppler evidence of pulmonic stenosis or  insufficiency. Left Atrium Normal size left atrium. Left Ventricle Ejection fraction is visually estimated at 35-40% Right Atrium Normal right atrium. Right Ventricle Normal right ventricular size and function. Pericardial Effusion No evidence of pericardial effusion. Miscellaneous The aorta is within normal limits. The IVC is normal M-Mode/2D Measurements & Calculations  LV Diastolic Dimension: LV Systolic Dimension:  AV Cusp Separation: 1.27  4.56 cm         3.39 cm          cmLA Dimension:  3.22 cmAO  LV FS:25.66 %      LV Volume Diastolic: 76.5 Root Dimension: 2.99 cm  LV PW Diastolic: 1.1 cm ml  Septum Diastolic: 1.23  LV Volume Systolic: 37.6  cm            ml              LV EDV/LV EDV Index: 76.5              ml/36 m2LV ESV/LV ESV   LA/Aorta: 1.08              Index: 37.6 ml/18 m2              EF Calculated: 50.85 %              EF Estimated: 40 % Doppler Measurements & Calculations  MV Peak E-Wave: 70.2 cm/s AV Peak Velocity: 173  MV Peak A-Wave: 98.8 cm/s cm/s           Estimated RVSP: 17.9 mmHg  MV E/A Ratio: 0.71    AV Peak Gradient: 11.97 Estimated RAP:5 mmHg  MV Peak Gradient: 1.97  mmHg  mmHg                           TR Velocity:179 cm/s                           TR Gradient:12.8164 mmHg [address] [place], [place] [address] [phone]  Other Result Information  Interface, Rad Results In - 07/31/2017  5:20 PM EDT Transthoracic Echocardiography Report (TTE)  Demographics  Patient Name      Ryan [name]   Gender                Male  Patient Number    [id number]   Race                  Unknown  Visit Number      [id number]    Room Number           470  Accession Number  [id number]  Date of Study         07/31/2017  Date of Birth     1966-01-12     Referring Physician   Holley Raring,  MD  Age               68 year(s)     Sonographer           Rashidi Pati Gallo, RCS                                   Interpreting          Holley Raring, MD                                   Physician Procedure Type of Study  TTE procedure: ECHOCARDIOGRAM W COLORFLOW SPECTRAL DOPPLER. Procedure date Date: 07/31/2017 Start: 04:26 PM Technical Quality: Adequate visualization Study Location: Portable Indications: Chest pain and status Post Surgical. Patient Status: Routine Height: 68.9 inches Weight: 209.44 pounds BSA: 2.1 m2 BMI: 31.02 kg/m2 BP: 147/84 mmHg Conclusions Summary Distal anterior, Anteroapical/apical wall hypokinesis LVEF 35-40% No significant valvular abnormalites noted Signature ------------------------------------------------------------------------------  Electronically signed by Holley Raring, MD(Interpreting physician)  on 07/31/2017  05:19 PM ------------------------------------------------------------------------------ Findings Mitral Valve Structurally normal mitral valve with good mobility and no significant regurgitation. Aortic Valve Structurally normal aortic valve with good leaflet mobility, and no regurgitation. Tricuspid Valve Tricuspid valve is structurally normal. No significant tricuspid regurgitation. Pulmonic Valve The pulmonic valve was not well visualized No Doppler evidence of pulmonic stenosis or insufficiency. Left Atrium Normal size left atrium. Left Ventricle Ejection fraction is visually estimated at 35-40% Right Atrium Normal right atrium. Right Ventricle Normal right ventricular size and function. Pericardial Effusion No evidence of pericardial effusion. Miscellaneous The aorta is within normal limits. The IVC is normal M-Mode/2D Measurements & Calculations  LV Diastolic Dimension:  LV Systolic Dimension:    AV Cusp Separation: 1.27  4.56 cm                  3.39 cm                   cmLA Dimension: 3.22 cmAO  LV FS:25.66 %             LV Volume Diastolic: 76.5 Root Dimension: 2.99 cm  LV PW Diastolic: 1.1 cm  ml  Septum Diastolic: 1.23   LV Volume Systolic: 37.6  cm                       ml                           LV EDV/LV EDV Index: 76.5                           ml/36 m2LV ESV/LV ESV     LA/Aorta: 1.08                           Index: 37.6 ml/18 m2                           EF Calculated: 50.85 %                           EF Estimated: 40 % Doppler Measurements & Calculations  MV Peak E-Wave: 70.2 cm/s AV Peak Velocity: 173  MV Peak A-Wave: 98.8 cm/s cm/s                     Estimated RVSP: 17.9 mmHg  MV E/A Ratio: 0.71        AV Peak Gradient: 11.97  Estimated RAP:5 mmHg  MV Peak Gradient: 1.97    mmHg  mmHg                                                     TR Velocity:179 cm/s                                                     TR Gradient:12.8164 mmHg [address] [place], [place] [address] [phone]    Assessment / Plan:   #1 recurrent anginal symptoms  related to recurrent in-stent stenosis of LAD stents and patient with poorly controlled diabetes hemoglobin A1c of 11 last checked 2019, continued smoking, and poor LV function ejection fraction 25 to 35% by ventriculogram-I have reviewed the patient's history and films with him.  I discussed with the patient and his family consideration for coronary artery bypass grafting particularly with grafts to the LAD and diagonal, small branches of the circumflex were too small to bypass.   Case has been reviewed with Dr. McAlhany-cardiology particular in regard to transitioning off Brilinta prior to surgery , will obtain a preop echocardiogram, stop his Brilinta for 6 to 7 days-Dr. [name] did not feel that admission with in-hospital transition was indicated.   Severe peripheral vascular disease status post aortobifem and right femoropopliteal Severe cerebrovascular disease status post left subclavian carotid bypass  Lab notified this am that there are now no  tubes  for p2y12 testing - patient assures [place] that he stopped Brelinta as instructed .  The goals risks and alternatives of the planned surgical procedure Procedure(s): CORONARY ARTERY BYPASS GRAFTING (CABG) (N/A) TRANSESOPHAGEAL ECHOCARDIOGRAM (TEE) (N/A)  have been discussed with the patient in detail. The risks of the procedure including death, infection, stroke, myocardial infarction, bleeding, blood transfusion have all been discussed specifically.  I have quoted [name] a 2 % of perioperative mortality and a complication rate as high as 40 %. The patient's questions have been answered.Eldrick Penick is willing  to proceed with the planned procedure.   Delight Ovens [phone]     05/05/2020 7:23 AM

## 2020-05-05 NOTE — Anesthesia Procedure Notes (Signed)
Procedure Name: Intubation Date/Time: 05/05/2020 8:49 AM Performed by: Harden Mo, CRNA Pre-anesthesia Checklist: Patient identified, Emergency Drugs available, Suction available and Patient being monitored Patient Re-evaluated:Patient Re-evaluated prior to induction Oxygen Delivery Method: Circle system utilized Preoxygenation: Pre-oxygenation with 100% oxygen Induction Type: IV induction Ventilation: Oral airway inserted - appropriate to patient size and Two handed mask ventilation required Laryngoscope Size: Mac and 3 Grade View: Grade I Tube type: Oral Tube size: 8.0 mm Number of attempts: 1 Airway Equipment and Method: Stylet Placement Confirmation: ETT inserted through vocal cords under direct vision,  positive ETCO2,  CO2 detector and breath sounds checked- equal and bilateral Secured at: 22 cm Tube secured with: Tape Comments: Performed by Sena Slate, SRNA

## 2020-05-05 NOTE — H&P (Signed)
Patient ID: Ryan [name], male   DOB: 08-23-1966, 54 y.o.   MRN: [id number]      [address]       [place] [address]             [id number]                    [name] [facility] Medical Record #[id number] Date of Birth: 01-21-1966  Referring: No ref. provider found Primary Care: Nolene Ebbs, MD Primary Cardiologist: [name], MD  Chief Complaint:    No chief complaint on file.   History of Present Illness:    Ryan [name] 54 y.o. male is seen in the office  today for consideration of CABG. the patient is currently on Brilinta and aspirin and Imdur.  Because of recurrent anginal symptoms and need for as needed nitroglycerin several times a week he underwent repeat cardiac catheterization as an outpatient last week.  He comes into the office today referred for consideration of coronary artery bypass grafting,    He has a history of on going tobacco abuse, CAD, HLD, PAD, HLD, ischemic cardiomyopathy and insulin dependent diabetes mellitus  Previous history includes an anterior STEMI in January 2013 with cardiac arrest and found to have an occluded mid LAD treated with a drug eluting stent. The third OM branch was chronically occluded and the RCA had mild disease. His post MI LVEF was 35% but this was found to be normal on follow up echo.   Cardiac cath in November 2017 with severe stent restenosis in the LAD, treated with a cutting balloon angioplasty.    Admitted to [facility] in August 2018 with an anterior STEMI. His LAD was occluded proximal to the old stent and this was treated with a drug eluting stent. Angioplasty performed on the ostial Diagonal branch. LVEF was 40% following the MI.   He had unstable angina in June 2020 and repeat cardiac cath showed severe restenosis in the mid LAD stented segment which was treated with cutting balloon angioplasty. He also had ostialstenosisof the diagonal branch (jailed by LAD stents),  mild to moderate non-obstructive disease in the mid RCAand moderate LV systolic dysfunction with anteroapical hypokinesis. LVEF=35-45%.He was placed on DAPT w/ ASA andBrilintaand continued on statin, ?blocker, ACEi and long acting nitrate   He has a history of severe peripheral vascular and cerebrovascular disease is followed  by Dr. Trula Slade. In 2013 he was found to have occlusion of his distal aorta and left subclavian artery. He underwent aortobifemoral bypass in February 2014 and left carotid to subclavian bypass in May 2014.  InApril2019,he underwent right femoral to AK popliteal bypass with saphenous vein and right femoral endarterectomy   Current Activity/ Functional Status:  Patient is independent with mobility/ambulation, transfers, ADL's, IADL's.   Zubrod Score: At the time of surgery this patient's most appropriate activity status/level should be described as: []     0    Normal activity, no symptoms [x]     1    Restricted in physical strenuous activity but ambulatory, able to do out light work []     2    Ambulatory and capable of self care, unable to do work activities, up and about               >50 % of waking hours                              []   3    Only limited self care, in bed greater than 50% of waking hours []     4    Completely disabled, no self care, confined to bed or chair []     5    Moribund   Past Medical History:  Diagnosis Date  . Acute myocardial infarction of other anterior wall, subsequent episode of care 12/12/2011  . Anal fissure   . Arthritis    back  . Atherosclerosis of native artery of both lower extremities with intermittent claudication (HCC) 04/01/2018  . Bruises easily    d/t being on Effient  . CAD (coronary artery disease)    A. Acute Ant STEMI 12/12/2011   . Cardiomyopathy secondary 12/26/2011  . Chronic total occlusion of artery of the extremities (HCC) 02/26/2012  . Claudication (HCC) 12/26/2011  . Coronary atherosclerosis of native  coronary artery    a. ant STEMI with cardiac arrest 2013 s/p DES to mLAD. b. 12/28/2011 10/2016 s/p PTCA to mLAD.  . Diabetes mellitus without complication (HCC)   . Essential hypertension 10/23/2016  . GERD (gastroesophageal reflux disease)    "takes tums"  . History of blood transfusion    no abnormal reaction noted  . History of kidney stones   . Hypertension   . Ischemic cardiomyopathy    a. EF 35% in 02/2012 at time of acute MI, improved to normal on subsequent imaging.  . MI (myocardial infarction) (HCC)    AMI 1/13 - complicated by VT/Tosades  . Mixed hyperlipidemia   . Numbness and tingling of right arm 03/31/2013  . Occlusion and stenosis of carotid artery without mention of cerebral infarction 08/26/2012  . PAD (peripheral artery disease) (HCC)    followed by Vascular - aortobifem bypass 01/2013 & left carotid-subclavian artery bypass 04/2013  . Peripheral vascular disease, unspecified (HCC) 12/16/2012  . PVD (peripheral vascular disease) (HCC) 03/31/2013  . Rectal polyp   . Subclavian steal syndrome 08/11/2013  . Uncontrolled diabetes mellitus (HCC) 10/23/2016  . Wears partial dentures    upper    Past Surgical History:  Procedure Laterality Date  . ABDOMINAL AORTOGRAM W/LOWER EXTREMITY N/A 06/26/2017   Procedure: Abdominal Aortogram w/Lower Extremity;  Surgeon: 10/25/2016, MD;  Location: [facility] INVASIVE CV LAB;  Service: Cardiovascular;  Laterality: N/A;  . ANAL FISSURECTOMY    . AORTA - BILATERAL FEMORAL ARTERY BYPASS GRAFT N/A 01/16/2013   Procedure: AORTA BIFEMORAL BYPASS GRAFT;  Surgeon: Nada Libman, MD;  Location: [facility] OR;  Service: Vascular;  Laterality: N/A;  . APPENDECTOMY    . CARDIAC CATHETERIZATION N/A 10/23/2016   Procedure: Left Heart Cath and Coronary Angiography;  Surgeon: Nada Libman, MD;  Location: [facility] INVASIVE CV LAB;  Service: Cardiovascular;  Laterality: N/A;  . CARDIAC CATHETERIZATION N/A 10/23/2016   Procedure: Coronary Balloon Angioplasty;   Surgeon: [facility], MD;  Location: [facility] IN[facility]AS[facility] CV LAB;  Service: Cardiovascular;  Laterality: N/A;  . CAROTID-SUBCLAVIAN BYPASS GRAFT Left 04/03/2013   Procedure: BYPASS GRAFT CAROTID-SUBCLAVIAN;  Surgeon: Kathleene Hazel, MD;  Location: [facility] OR;  Service: Vascular;  Laterality: Left;  . CIRCUMCISION N/A 12/15/2015   Procedure: CIRCUMCISION ADULT;  Surgeon: Nada Libman, MD;  Location: [facility]S;  Service: Urology;  Laterality: N/A;  . COLONOSCOPY    . CORONARY ANGIOPLASTY     stent placed Dec 12, 2011 and 2018  . CORONARY BALLOON ANGIOPLASTY N/A 05/23/2019   Procedure: CORONARY BALLOON ANGIOPLASTY;  Surgeon: 2019, MD;  Location: [facility] INVASIVE  CV LAB;  Service: Cardiovascular;  Laterality: N/A;  . DIAGNOSTIC LAPAROSCOPY    . ENDARTERECTOMY FEMORAL Right 03/13/2018   Procedure: FEMORAL ENDARTERECTOMY RIGHT;  Surgeon: Nada Libman, MD;  Location: [facility] OR;  Service: Vascular;  Laterality: Right;  . FEMORAL-POPLITEAL BYPASS GRAFT Right 03/13/2018   Procedure: FEMORAL-POPLITEAL ARTERY BYPASS WITH NON-REVERSE VEIN RIGHT;  Surgeon: Nada Libman, MD;  Location: [facility] OR;  Service: Vascular;  Laterality: Right;  . GROIN DISSECTION Right 03/13/2018   Procedure: RE-DO COMMON FEMORAL ARTERY EXPOSURE;  Surgeon: Nada Libman, MD;  Location: [facility] OR;  Service: Vascular;  Laterality: Right;  . I & D EXTREMITY Left 04/18/2013   Procedure: debridement of left neck lymphocele;  Surgeon: Nada Libman, MD;  Location: [facility] OR;  Service: Vascular;  Laterality: Left;  I and D of left neck  . I & D EXTREMITY Right 11/21/2014   Procedure: IRRIGATION AND DEBRIDEMENT RIGHT HAND;  Surgeon: Dominica Severin, MD;  Location: [facility] OR;  Service: Orthopedics;  Laterality: Right;  . INTRAVASCULAR PRESSURE WIRE/FFR STUDY N/A 04/22/2020   Procedure: INTRAVASCULAR PRESSURE WIRE/FFR STUDY;  Surgeon: Kathleene Hazel, MD;  Location: [facility] CV [facility];  Service: Cardiovascular;  Laterality: N/A;  . LEFT  HEART CATH AND CORONARY ANGIOGRAPHY N/A 05/23/2019   Procedure: LEFT HEART CATH AND CORONARY ANGIOGRAPHY;  Surgeon: Kathleene Hazel, MD;  Location: [facility] CV LAB;  Service: Cardiovascular;  Laterality: N/A;  . LEFT HEART CATH AND CORONARY ANGIOGRAPHY N/A 04/22/2020   Procedure: LEFT HEART CATH AND CORONARY ANGIOGRAPHY;  Surgeon: Kathleene Hazel, MD;  [facility]: [facility] INV[facility] CV LAB;  Service: Cardiovascular;  Laterality: N/A;  . LEFT HEART CATHETERIZATION WITH CORONARY ANGIOGRAM N/A 12/12/2011   Procedure: LEFT HEART CATHETERIZATION WITH CORONARY ANGIOGRAM;  Surgeon: Kathleene Hazel, MD;  Location: [facility] CATH LAB;  Service: Cardiovascular;  Laterality: N/A;  . LOWER EXTREMITY ANGIOGRAM N/A 01/31/2012   Procedure: LOWER EXTREMITY ANGIOGRAM;  Surgeon: Kathleene Hazel, MD;  Location: [facility] CATH LAB;  Service: Cardiovascular;  Laterality: N/A;  . LOWER EXTREMITY ANGIOGRAPHY N/A 04/23/2018   Procedure: LOWER EXTREMITY ANGIOGRAPHY;  Surgeon: Nada Libman, MD;  [facility]: [facility] CV [facility];  Service: Cardiovascular;  Laterality: N/A;  . PERCUTANEOUS CORONARY STENT INTERVENTION (PCI-S) Right 12/12/2011   Procedure: PERCUTANEOUS CORONARY STENT INTERVENTION (PCI-S);  Surgeon: Kathleene Hazel, MD;  Location: [facility] CATH LAB;  Service: Cardiovascular;  Laterality: Right;  . PERIPHERAL VASCULAR INTERVENTION Left 04/23/2018   Procedure: PERIPHERAL VASCULAR INTERVENTION;  Surgeon: Nada Libman, MD;  Location: [facility] INVASIVE CV LAB;  Service: Cardiovascular;  Laterality: Left;  superficial femoral  . REPAIR EXTENSOR TENDON Right 11/21/2014   Procedure: WITH REPAIR/RECONSTRUCTION OF EXTENSOR TENDONS AS NEEDED;  Surgeon: Dominica Severin, MD;  Location: [facility] OR;  Service: Orthopedics;  Laterality: Right;    Family History  Problem Relation Age of Onset  . Cancer Father        Lung  . Diabetes Mother   . COPD Mother   . Cancer Maternal Uncle        Colon     Social History   Tobacco  Use  Smoking Status Current Every Day Smoker  . Packs/day: 0.25  . Years: 25.00  . Pack years: 6.25  . Types: Cigarettes  Smokeless Tobacco Never Used  Tobacco Comment   1-2 cigs a day    Social History   Substance and Sexual Activity  Alcohol Use Yes  . Alcohol/week: 4.0 standard drinks  . Types: 4  Cans of beer per week     Allergies  Allergen Reactions  . Hydrocodone Hives and Nausea And Vomiting    Current Facility-Administered Medications  Medication Dose Route Frequency Provider Last Rate Last Admin  . cefUROXime (ZINACEF) 1.5 g in sodium chloride 0.9 % 100 mL IVPB  1.5 g Intravenous To OR Delight Ovens, MD      . cefUROXime (ZINACEF) 750 mg in sodium chloride 0.9 % 100 mL IVPB  750 mg Intravenous To OR Delight Ovens, MD      . chlorhexidine (HIBICLENS) 4 % liquid 2 application  30 mL Topical UD Delight Ovens, MD      . dexmedetomidine (PRECEDEX) 400 MCG/100ML (4 mcg/mL) infusion  0.1-0.7 mcg/kg/hr Intravenous To OR Delight Ovens, MD      . EPINEPHrine (ADRENALIN) 4 mg in NS 250 mL (0.016 mg/mL) premix infusion  0-10 mcg/min Intravenous To OR Delight Ovens, MD      . heparin 30,000 units/NS 1000 mL solution for CELLSAVER   Other To OR Delight Ovens, MD      . heparin sodium (porcine) 2,500 Units, papaverine 30 mg in electrolyte-148 (PLASMALYTE-148) 500 mL irrigation   Irrigation To OR Delight Ovens, MD      . insulin regular, human (MYXREDLIN) 100 units/ 100 mL infusion   Intravenous To OR Delight Ovens, MD      . magnesium sulfate (IV Push/IM) injection 40 mEq  40 mEq Other To OR Delight Ovens, MD      . metoprolol tartrate (LOPRESSOR) tablet 12.5 mg  12.5 mg Oral Once Delight Ovens, MD      . milrinone (PRIMACOR) 20 MG/100 ML (0.2 mg/mL) infusion  0.3 mcg/kg/min Intravenous To OR Delight Ovens, MD      . nitroGLYCERIN 50 mg in dextrose 5 % 250 mL (0.2 mg/mL) infusion  2-200 mcg/min Intravenous To OR Delight Ovens,  MD      . norepinephrine (LEVOPHED) 4mg  in premix infusion  0-40 mcg/min Intravenous To OR Delight Ovens, MD      . potassium chloride injection 80 mEq  80 mEq Other To OR Delight Ovens, MD      . tranexamic acid (CYKLOKAPRON) 2,500 mg in sodium chloride 0.9 % 250 mL (10 mg/mL) infusion  1.5 mg/kg/hr Intravenous To OR Delight Ovens, MD      . tranexamic acid (CYKLOKAPRON) bolus via infusion - over 30 minutes 1,462.5 mg  15 mg/kg Intravenous To OR Delight Ovens, MD      . tranexamic acid (CYKLOKAPRON) pump prime solution 195 mg  2 mg/kg Intracatheter To OR Delight Ovens, MD      . vancomycin (VANCOREADY) IVPB 1500 mg/300 mL  1,500 mg Intravenous To OR Delight Ovens, MD   1,500 mg at 05/05/20 0830   Facility-Administered Medications Ordered in Other Encounters  Medication Dose Route Frequency Provider Last Rate Last Admin  . artificial tears (LACRILUBE) ophthalmic ointment   Both Eyes Anesthesia Intra-op Nils Pyle, CRNA   1 application at 05/05/20 [phone]  . fentaNYL citrate (PF) (SUBLIMAZE) injection   Intravenous Anesthesia Intra-op Nils Pyle, CRNA   150 mcg at 05/05/20 0845  . lactated ringers infusion   Intravenous Continuous PRN Nils Pyle, CRNA   New Bag at 05/05/20 [phone]  . lactated ringers infusion   Intravenous Continuous PRN Nils Pyle, CRNA   New Bag at 05/05/20 0740  .  lactated ringers infusion   Intravenous Continuous PRN Nils Pyle, CRNA   New Bag at 05/05/20 [phone]  . midazolam (VERSED) 5 MG/5ML injection   Intravenous Anesthesia Intra-op Nils Pyle, CRNA   2 mg at 05/05/20 0844  . phenylephrine (NEOSYNEPHRINE) 10-0.9 MG/250ML-% infusion   Intravenous Continuous PRN Nils Pyle, CRNA   Stopped at 05/05/20 [phone]  . PHENYLephrine 40 mcg/ml in normal saline Adult IV Push Syringe (For Blood Pressure Support)   Intravenous Anesthesia Intra-op Nils Pyle, CRNA   80 mcg at 05/05/20 0858  . propofol (DIPRIVAN) 10 mg/mL bolus/IV push    Intravenous Anesthesia Intra-op Nils Pyle, CRNA   110 mg at 05/05/20 0846  . rocuronium bromide 10 mg/mL (PF) syringe   Intravenous Anesthesia Intra-op Nils Pyle, CRNA   70 mg at 05/05/20 0847    Pertinent items are noted in HPI.   Review of Systems:     Cardiac Review of Systems: [Y] = yes  or   [ N ] = no   Chest Pain [ y   ]  Resting SOB [  n ] Exertional SOB  Cove.Etienne  ]  Orthopnea [ n ]   Pedal Edema [ n  ]    Palpitations [ n ] Syncope  [  n]   Presyncope [ n  ]   General Review of Systems: [Y] = yes [  ]=no Constitional: recent weight change [  ];  Wt loss over the last 3 months [   ] anorexia [  ]; fatigue [  ]; nausea [  ]; night sweats [  ]; fever [  ]; or chills [  ];           Eye : blurred vision [  ]; diplopia [   ]; vision changes [  ];  Amaurosis fugax[  ]; Resp: cough [ y ];  wheezing[  ];  hemoptysis[  ]; shortness of breath[  ]; paroxysmal nocturnal dyspnea[  ]; dyspnea on exertion[  ]; or orthopnea[  ];  GI:  gallstones[  ], vomiting[  ];  dysphagia[  ]; melena[  ];  hematochezia [  ]; heartburn[  ];   Hx of  Colonoscopy[  ]; GU: kidney stones [  ]; hematuria[  ];   dysuria [  ];  nocturia[  ];  history of     obstruction [  ]; urinary frequency [  ]             Skin: rash, swelling[  ];, hair loss[  ];  peripheral edema[  ];  or itching[  ]; Musculosketetal: myalgias[  ];  joint swelling[  ];  joint erythema[  ];  joint pain[  ];  back pain[  ];  Heme/Lymph: bruising[  ];  bleeding[  ];  anemia[  ];  Neuro: TIA[  ];  headaches[  ];  stroke[  ];  vertigo[  ];  seizures[  ];   paresthesias[ y ];  difficulty walking[  ];  Psych:depression[  ]; anxiety[  ];  Endocrine: diabetes[y  ];  thyroid dysfunction[  ];  Immunizations: Flu up to date [ y ]; Pneumococcal up to date [ n ];  Other:no covid vacination     PHYSICAL EXAMINATION: BP (!) 110/57   Pulse 81   Temp 98.1 F (36.7 C) (Oral)   Resp 18   Ht  (1.753 m)   Wt 97.1 kg   SpO2 97%  BMI 31.60 kg/m    General appearance: alert, cooperative, appears older than stated age and no distress Head: Normocephalic, without obvious abnormality, atraumatic Neck: no adenopathy, no carotid bruit, no JVD, supple, symmetrical, trachea midline and thyroid not enlarged, symmetric, no tenderness/mass/nodules Lymph nodes: Cervical, supraclavicular, and axillary nodes normal. Resp: clear to auscultation bilaterally Cardio: regular rate and rhythm, S1, S2 normal, no murmur, click, rub or gallop GI: soft, non-tender; bowel sounds normal; no masses,  no organomegaly and Midline abdominal scar from previous aortobifem Extremities: extremities normal, atraumatic, no cyanosis or edema and Homans sign is negative, no sign of DVT Neurologic: Grossly normal Patient has left lower neck incision from previous carotid subclavian bypass, left arm radial pulse is strong, patient has palpable DP PT pulses bilaterally the feet.  Diagnostic Studies & Laboratory data:     Recent Radiology Findings:   DG Chest 2 View  Result Date: 04/30/2020 CLINICAL DATA:  Preoperative study. EXAM: CHEST - 2 VIEW COMPARISON:  Chest x-ray 01/16/2013. FINDINGS: Mediastinum and hilar structures normal. Borderline cardiomegaly and pulmonary venous congestion. Tiny calcified pulmonary nodules noted consistent granulomas or prior viral infection. No focal infiltrate. No pleural effusion or pneumothorax. Degenerative change thoracic spine. IMPRESSION: 1.  Borderline cardiomegaly and pulmonary venous congestion. 2.  No acute pulmonary disease. Electronically Signed   By: Maisie Fus  Register   On: 04/30/2020 14:09   CARDIAC CATHETERIZATION  Result Date: 04/22/2020  Ost 3rd Mrg to 3rd Mrg lesion is 100% stenosed.  Mid RCA lesion is 60% stenosed.  3rd Mrg lesion is 100% stenosed.  Prox LAD to Mid LAD lesion is 80% stenosed.  1st Diag lesion is 80% stenosed.  The left ventricular ejection fraction is 25-35% by visual estimate.  There is moderate left  ventricular systolic dysfunction.  There is no mitral valve regurgitation.  1. Severe restenosis mid LAD stented segment 2. Severe stenosis ostium of the Diagonal branch, jailed by the stent 3. Chronic occlusion of the third OM branch, filling from collaterals 4. Moderate mid RCA stenosis. 5. Moderate segmental LV systolic dysfunction Recommendations: He has recurrence of stenosis in the mid LAD stented segment involving a large diagonal branch. This area was initially stented in the setting of an anterior MI due to vessel occlusion in 2013 and then restenosis was treated with cutting balloon angioplasty in 2017. He then had another anterior STEMI due to stent thrombosis/restenosis in 2018 and had another drug eluting stent placed at that time overlapping the prior stent. I then treated restenosis in the stented segment in 2020. With the recurrence of disease in the stented segment and his diabetes, I think the best treatment option is CABG. LAD severity demonstrated with pressure wire. Further stenting of the LAD would likely compromise flow into the large Diagonal branch. Will continue ASA/brilinta/statin and beta blocker and refer as an outpatient to CT surgery for CABG.   ECHOCARDIOGRAM COMPLETE  Result Date: 05/04/2020    ECHOCARDIOGRAM REPORT   Patient Name:   BYAN POPLASKI Date of Exam: 05/04/2020 Medical Rec #:  [id number]    Height:       69.0 in Accession #:    [id number]   Weight:       214.9 lb Date of Birth:  09-17-1966   BSA:          2.130 m Patient Age:    53 years     BP:           105/76 mmHg Patient Gender: M  HR:           89 bpm. Exam Location:  Outpatient Procedure: 2D Echo, Cardiac Doppler and Color Doppler Indications:    Pre-op evaluation                 CAD (coronary artery disease)  History:        Patient has prior history of Echocardiogram examinations, most                 recent 02/16/2012. Cardiomyopathy, Acute MI and CAD,                 Signs/Symptoms:Chest Pain; Risk  Factors:Hypertension,                 Dyslipidemia and Current Smoker. PVD. PAD.  Sonographer:    Renella Cunas RDCS Referring Phys: [phone] Arpita Fentress B Jevon [place] IMPRESSIONS  1. The mid septum, apical septum, and apex segments are akinetic. Would recommend a limited contrast echo to exclude apical thrombus as this is not well visualized on the current study. EF 40-45%. Findings consistent with prior LAD infarction.  2. Left ventricular ejection fraction, by estimation, is 40 to 45%. The left ventricle has mildly decreased function. The left ventricle demonstrates regional wall motion abnormalities (see scoring diagram/findings for description). Left ventricular diastolic parameters are consistent with Grade III diastolic dysfunction (restrictive).  3. Right ventricular systolic function is normal. The right ventricular size is normal. Tricuspid regurgitation signal is inadequate for assessing PA pressure.  4. The mitral valve is grossly normal. Trivial mitral valve regurgitation. No evidence of mitral stenosis.  5. The aortic valve is tricuspid. Aortic valve regurgitation is not visualized. Mild aortic valve sclerosis is present, with no evidence of aortic valve stenosis. Comparison(s): Changes from prior study are noted. Conclusion(s)/Recommendation(s): Findings consistent with ischemic cardiomyopathy. The mid septum, apical septum, and apex segments are akinetic. Would recommend a limited contrast echo to exclude apical thrombus as this is not well visualized on the current study. FINDINGS  Left Ventricle: Left ventricular ejection fraction, by estimation, is 40 to 45%. The left ventricle has mildly decreased function. The left ventricle demonstrates regional wall motion abnormalities. The left ventricular internal cavity size was normal in size. There is no left ventricular hypertrophy. Left ventricular diastolic parameters are consistent with Grade III diastolic dysfunction (restrictive).  LV Wall Scoring: The mid and  distal anterior septum and apex are akinetic. Right Ventricle: The right ventricular size is normal. No increase in right ventricular wall thickness. Right ventricular systolic function is normal. Tricuspid regurgitation signal is inadequate for assessing PA pressure. Left Atrium: Left atrial size was normal in size. Right Atrium: Right atrial size was normal in size. Pericardium: There is no evidence of pericardial effusion. Mitral Valve: The mitral valve is grossly normal. Trivial mitral valve regurgitation. No evidence of mitral valve stenosis. Tricuspid Valve: The tricuspid valve is grossly normal. Tricuspid valve regurgitation is trivial. No evidence of tricuspid stenosis. Aortic Valve: The aortic valve is tricuspid. Aortic valve regurgitation is not visualized. Mild aortic valve sclerosis is present, with no evidence of aortic valve stenosis. Pulmonic Valve: The pulmonic valve was grossly normal. Pulmonic valve regurgitation is not visualized. No evidence of pulmonic stenosis. Aorta: The aortic root is normal in size and structure. IAS/Shunts: The atrial septum is grossly normal.  LEFT VENTRICLE PLAX 2D LVIDd:         5.50 cm      Diastology LVIDs:         4.60  cm      LV e' lateral:   9.14 cm/s LV PW:         0.70 cm      LV E/e' lateral: 10.2 LV IVS:        0.70 cm      LV e' medial:    7.40 cm/s LVOT diam:     2.10 cm      LV E/e' medial:  12.6 LV SV:         52 LV SV Index:   24 LVOT Area:     3.46 cm  LV Volumes (MOD) LV vol d, MOD A2C: 138.0 ml LV vol d, MOD A4C: 132.0 ml LV vol s, MOD A2C: 80.0 ml LV vol s, MOD A4C: 80.9 ml LV SV MOD A2C:     58.0 ml LV SV MOD A4C:     132.0 ml LV SV MOD BP:      55.1 ml RIGHT VENTRICLE RV S prime:     12.30 cm/s TAPSE (M-mode): 2.0 cm LEFT ATRIUM           Index       RIGHT ATRIUM           Index LA diam:      3.80 cm 1.78 cm/m  RA Area:     12.20 cm LA Vol (A2C): 21.8 ml 10.23 ml/m RA Volume:   26.20 ml  12.30 ml/m LA Vol (A4C): 41.3 ml 19.39 ml/m  AORTIC VALVE  LVOT Vmax:   84.20 cm/s LVOT Vmean:  57.000 cm/s LVOT VTI:    0.150 m  AORTA Ao Root diam: 2.80 cm MITRAL VALVE MV Area (PHT): 6.12 cm    SHUNTS MV Decel Time: 124 msec    Systemic VTI:  0.15 m MV E velocity: 93.40 cm/s  Systemic Diam: 2.10 cm MV A velocity: 43.30 cm/s MV E/A ratio:  2.16 Lennie Odor MD Electronically signed by Lennie Odor MD Signature Date/Time: 05/04/2020/5:31:05 PM    Final    VAS US DOPPLER PRE CABG  Result Date: 05/04/2020 PREOPERATIVE VASCULAR EVALUATION  Indications:  Pre-CABG. Risk Factors: Hypertension, hyperlipidemia, Diabetes, coronary artery disease,               PAD. Performing Technologist: Blanch Media RVS  Examination Guidelines: A complete evaluation includes B-mode imaging, spectral Doppler, color Doppler, and power Doppler as needed of all accessible portions of each vessel. Bilateral testing is considered an integral part of a complete examination. Limited examinations for reoccurring indications may be performed as noted.  Right Carotid Findings: +----------+--------+--------+--------+------------+--------+           PSV cm/sEDV cm/sStenosisDescribe    Comments +----------+--------+--------+--------+------------+--------+ CCA Prox  106     21              heterogenous         +----------+--------+--------+--------+------------+--------+ CCA Distal68      22              heterogenous         +----------+--------+--------+--------+------------+--------+ ICA Prox  87      34      1-39%   heterogenous         +----------+--------+--------+--------+------------+--------+ ICA Distal76      32                                   +----------+--------+--------+--------+------------+--------+ ECA  144     16                                   +----------+--------+--------+--------+------------+--------+ Portions of this table do not appear on this page. +----------+--------+-------+--------+------------+           PSV cm/sEDV  cmsDescribeArm Pressure +----------+--------+-------+--------+------------+ Subclavian135                                 +----------+--------+-------+--------+------------+ +---------+--------+--+--------+--+---------+ VertebralPSV cm/s35EDV cm/s15Antegrade +---------+--------+--+--------+--+---------+ Left Carotid Findings: +----------+--------+--------+--------+------------+--------+           PSV cm/sEDV cm/sStenosisDescribe    Comments +----------+--------+--------+--------+------------+--------+ CCA Prox  147     30              heterogenous         +----------+--------+--------+--------+------------+--------+ CCA Distal81      30              heterogenous         +----------+--------+--------+--------+------------+--------+ ICA Prox  86      48      1-39%   heterogenous         +----------+--------+--------+--------+------------+--------+ ICA Distal83      41                                   +----------+--------+--------+--------+------------+--------+ ECA       137     27                                   +----------+--------+--------+--------+------------+--------+ +----------+--------+--------+--------+------------+ SubclavianPSV cm/sEDV cm/sDescribeArm Pressure +----------+--------+--------+--------+------------+           123                                  +----------+--------+--------+--------+------------+ +---------+--------+--+--------+--+---------+ VertebralPSV cm/s58EDV cm/s14Antegrade +---------+--------+--+--------+--+---------+  ABI Findings: +--------+------------------+-----+---------+--------+ Right   Rt Pressure (mmHg)IndexWaveform Comment  +--------+------------------+-----+---------+--------+ [id number]                    triphasic         +--------+------------------+-----+---------+--------+ PTA     142               0.95 triphasic          +--------+------------------+-----+---------+--------+ DP      136               0.91 triphasic         +--------+------------------+-----+---------+--------+ +--------+------------------+-----+---------+-------+ Left    Lt Pressure (mmHg)IndexWaveform Comment +--------+------------------+-----+---------+-------+ [id number]                    triphasic        +--------+------------------+-----+---------+-------+ PTA     157               1.05 triphasic        +--------+------------------+-----+---------+-------+ DP      145               0.97 triphasic        +--------+------------------+-----+---------+-------+ +-------+---------------+----------------+ ABI/TBIToday's ABI/TBIPrevious ABI/TBI +-------+---------------+----------------+ Right  0.95                            +-------+---------------+----------------+  Left   1.05                            +-------+---------------+----------------+  Right Doppler Findings: +--------+--------+-----+---------+--------+ Site    PressureIndexDoppler  Comments +--------+--------+-----+---------+--------+ [id number]          triphasic         +--------+--------+-----+---------+--------+ Radial               triphasic         +--------+--------+-----+---------+--------+ Ulnar                triphasic         +--------+--------+-----+---------+--------+  Left Doppler Findings: +--------+--------+-----+---------+--------+ Site    PressureIndexDoppler  Comments +--------+--------+-----+---------+--------+ [id number]          triphasic         +--------+--------+-----+---------+--------+ Radial               triphasic         +--------+--------+-----+---------+--------+ Ulnar                triphasic         +--------+--------+-----+---------+--------+  Summary: Right Carotid: Velocities in the right ICA are consistent with a 1-39% stenosis. Left Carotid: Velocities in the left ICA are  consistent with a 1-39% stenosis. Vertebrals: Bilateral vertebral arteries demonstrate antegrade flow. Right ABI: Resting right ankle-brachial index is within normal range. No evidence of significant right lower extremity arterial disease. Left ABI: Resting left ankle-brachial index is within normal range. No evidence of significant left lower extremity arterial disease. Right Upper Extremity: Doppler waveforms remain within normal limits with right radial compression. Doppler waveforms remain within normal limits with right ulnar compression. Left Upper Extremity: Doppler waveforms remain within normal limits with left radial compression. Doppler waveforms decrease 50% with left ulnar compression.  Electronically signed by Waverly Ferrari MD on 05/04/2020 at 6:07:48 PM.    Final      I have independently reviewed the above radiology studies  and reviewed the findings with the patient.   Recent Lab Findings: Lab Results  Component Value Date   WBC 9.6 04/30/2020   HGB 16.3 04/30/2020   HCT 48.9 04/30/2020   PLT 199 04/30/2020   GLUCOSE 129 (H) 04/30/2020   CHOL 180 11/09/2017   TRIG 299 (H) 11/09/2017   HDL 31 (L) 11/09/2017   LDLCALC 89 11/09/2017   ALT 48 (H) 04/30/2020   AST 35 04/30/2020   NA 136 04/30/2020   K 4.2 04/30/2020   CL 104 04/30/2020   CREATININE 0.91 04/30/2020   BUN 15 04/30/2020   CO2 23 04/30/2020   TSH 0.832 01/18/2013   INR 1.0 04/30/2020   HGBA1C 11.8 (H) 04/30/2020   Last echo in the [facility] system 2013   Transthoracic Echocardiography Report (TTE)  Demographics  Patient Name   DESHAUN WEISINGER  Gender        Male  Patient Number  [id number]  Race         Unknown  Visit Number   [id number]  Room Number      470  Accession Number [id number] Date of Study     07/31/2017  Date of Birth   01/15/1966   Referring Physician  Holley Raring, MD  Age        55 year(s)   Sonographer      Rashidi Pati Gallo, RCS  Interpreting     Holley Raring, MD                  Physician Procedure Type of Study  TTE procedure: ECHOCARDIOGRAM W COLORFLOW SPECTRAL DOPPLER. Procedure date Date: 07/31/2017 Start: 04:26 PM Technical Quality: Adequate visualization Study Location: Portable Indications: Chest pain and status Post Surgical. Patient Status: Routine Height: 68.9 inches Weight: 209.44 pounds BSA: 2.1 m2 BMI: 31.02 kg/m2 BP: 147/84 mmHg Conclusions Summary Distal anterior, Anteroapical/apical wall hypokinesis LVEF 35-40% No significant valvular abnormalites noted Signature ------------------------------------------------------------------------------  Electronically signed by Holley Raring, MD(Interpreting physician) on 07/31/2017  05:19 PM ------------------------------------------------------------------------------ Findings Mitral Valve Structurally normal mitral valve with good mobility and no significant regurgitation. Aortic Valve Structurally normal aortic valve with good leaflet mobility, and no regurgitation. Tricuspid Valve Tricuspid valve is structurally normal. No significant tricuspid regurgitation. Pulmonic Valve The pulmonic valve was not well visualized No Doppler evidence of pulmonic stenosis or insufficiency. Left Atrium Normal size left atrium. Left Ventricle Ejection fraction is visually estimated at 35-40% Right Atrium Normal right atrium. Right Ventricle Normal right ventricular size and function. Pericardial Effusion No evidence of pericardial effusion. Miscellaneous The aorta is within normal limits. The IVC is normal M-Mode/2D Measurements & Calculations  LV Diastolic Dimension: LV Systolic Dimension:  AV Cusp Separation: 1.27  4.56 cm         3.39 cm          cmLA Dimension: 3.22 cmAO  LV FS:25.66 %      LV Volume Diastolic: 76.5 Root Dimension: 2.99 cm  LV PW Diastolic: 1.1 cm ml   Septum Diastolic: 1.23  LV Volume Systolic: 37.6  cm            ml              LV EDV/LV EDV Index: 76.5              ml/36 m2LV ESV/LV ESV   LA/Aorta: 1.08              Index: 37.6 ml/18 m2              EF Calculated: 50.85 %              EF Estimated: 40 % Doppler Measurements & Calculations  MV Peak E-Wave: 70.2 cm/s AV Peak Velocity: 173  MV Peak A-Wave: 98.8 cm/s cm/s           Estimated RVSP: 17.9 mmHg  MV E/A Ratio: 0.71    AV Peak Gradient: 11.97 Estimated RAP:5 mmHg  MV Peak Gradient: 1.97  mmHg  mmHg                           TR Velocity:179 cm/s                           TR Gradient:12.8164 mmHg [address] [place], [place] [address] [phone]  Other Result Information  Interface, Rad Results In - 07/31/2017  5:20 PM EDT Transthoracic Echocardiography Report (TTE)  Demographics  Patient Name      NEHEMIAS SAUCEDA   Gender                Male  Patient Number    [id number]   Race                  Unknown  Visit Number      [id number]  Room Number           66  Accession Number  [id number] HP  Date of Study         07/31/2017  Date of Birth     11/22/1966     Referring Physician   Rozann Lesches, MD  Age               35 year(s)     Sonographer           Rashidi Janell Quiet, RCS                                   Interpreting          Rozann Lesches, MD                                   Physician Procedure Type of Study  TTE procedure: ECHOCARDIOGRAM W COLORFLOW SPECTRAL DOPPLER. Procedure date Date: 07/31/2017 Start: 04:26 PM Technical Quality: Adequate visualization Study Location: Portable Indications: Chest pain and status Post Surgical. Patient Status: Routine Height: 68.9 inches Weight: 209.44 pounds BSA: 2.1 m2 BMI: 31.02 kg/m2 BP: 147/84 mmHg Conclusions Summary Distal anterior, Anteroapical/apical wall hypokinesis LVEF  35-40% No significant valvular abnormalites noted Signature ------------------------------------------------------------------------------  Electronically signed by Rozann Lesches, MD(Interpreting physician) on 07/31/2017  05:19 PM ------------------------------------------------------------------------------ Findings Mitral Valve Structurally normal mitral valve with good mobility and no significant regurgitation. Aortic Valve Structurally normal aortic valve with good leaflet mobility, and no regurgitation. Tricuspid Valve Tricuspid valve is structurally normal. No significant tricuspid regurgitation. Pulmonic Valve The pulmonic valve was not well visualized No Doppler evidence of pulmonic stenosis or insufficiency. Left Atrium Normal size left atrium. Left Ventricle Ejection fraction is visually estimated at 35-40% Right Atrium Normal right atrium. Right Ventricle Normal right ventricular size and function. Pericardial Effusion No evidence of pericardial effusion. Miscellaneous The aorta is within normal limits. The IVC is normal M-Mode/2D Measurements & Calculations  LV Diastolic Dimension:  LV Systolic Dimension:    AV Cusp Separation: 1.27  4.56 cm                  3.39 cm                   cmLA Dimension: 3.22 cmAO  LV FS:25.66 %            LV Volume Diastolic: 98.1 Root Dimension: 2.99 cm  LV PW Diastolic: 1.1 cm  ml  Septum Diastolic: 1.91   LV Volume Systolic: 47.8  cm                       ml                           LV EDV/LV EDV Index: 76.5                           ml/36 m2LV ESV/LV ESV     LA/Aorta: 1.08                           Index: 37.6 ml/18 m2  EF Calculated: 50.85 %                           EF Estimated: 40 % Doppler Measurements & Calculations  MV Peak E-Wave: 70.2 cm/s AV Peak Velocity: 173  MV Peak A-Wave: 98.8 cm/s cm/s                     Estimated RVSP: 17.9 mmHg  MV E/A Ratio: 0.71        AV Peak Gradient: 11.97   Estimated RAP:5 mmHg  MV Peak Gradient: 1.97    mmHg  mmHg                                                     TR Velocity:179 cm/s                                                     TR Gradient:12.8164 mmHg [address] [place], [place] [address] [phone]    Assessment / Plan:   #1 recurrent anginal symptoms related to recurrent in-stent stenosis of LAD stents and patient with poorly controlled diabetes hemoglobin A1c of 11 last checked 2019, continued smoking, and poor LV function ejection fraction 25 to 35% by ventriculogram-I have reviewed the patient's history and films with him.  I discussed with the patient and his family consideration for coronary artery bypass grafting particularly with grafts to the LAD and diagonal, small branches of the circumflex were too small to bypass.   Case has been reviewed with Dr. McAlhany-cardiology particular in regard to transitioning off Brilinta prior to surgery , will obtain a preop echocardiogram, stop his Brilinta for 6 to 7 days-Dr. [name] did not feel that admission with in-hospital transition was indicated.   Severe peripheral vascular disease status post aortobifem and right femoropopliteal Severe cerebrovascular disease status post left subclavian carotid bypass  Lab notified this am that there are now no  tubes for p2y12 testing - patient assures [place] that he stopped Brelinta as instructed .  The goals risks and alternatives of the planned surgical procedure Procedure(s): CORONARY ARTERY BYPASS GRAFTING (CABG) (N/A) TRANSESOPHAGEAL ECHOCARDIOGRAM (TEE) (N/A)  have been discussed with the patient in detail. The risks of the procedure including death, infection, stroke, myocardial infarction, bleeding, blood transfusion have all been discussed specifically.  I have quoted [name] a 2 % of perioperative mortality and a complication rate as high as 40 %. The patient's questions have been answered.Phi Avans is willing  to proceed with the  planned procedure.   Delight Ovens [phone]     05/05/2020 9:00 AM

## 2020-05-05 NOTE — Anesthesia Preprocedure Evaluation (Addendum)
Anesthesia Evaluation  Patient identified by MRN, date of birth, ID band Patient awake    Reviewed: Allergy & Precautions, H&P , NPO status , Patient's Chart, lab work & pertinent test results  Airway Mallampati: III  TM Distance: >3 FB Neck ROM: full    Dental  (+) Missing, Dental Advisory Given, Poor Dentition   Pulmonary Current Smoker and Patient abstained from smoking.,    breath sounds clear to auscultation       Cardiovascular hypertension, + angina + CAD, + Past MI and + Peripheral Vascular Disease   Rhythm:regular Rate:Normal     Neuro/Psych    GI/Hepatic GERD  ,  Endo/Other  diabetes, Type 2  Renal/GU      Musculoskeletal  (+) Arthritis ,   Abdominal   Peds  Hematology   Anesthesia Other Findings   Reproductive/Obstetrics                            Anesthesia Physical Anesthesia Plan  ASA: III  Anesthesia Plan: General   Post-op Pain Management:    Induction: Intravenous  PONV Risk Score and Plan: 1 and Ondansetron, Dexamethasone, Midazolam and Treatment may vary due to age or medical condition  Airway Management Planned: Oral ETT  Additional Equipment: Arterial line, CVP, TEE and Ultrasound Guidance Line Placement  Intra-op Plan:   Post-operative Plan: Post-operative intubation/ventilation  Informed Consent: I have reviewed the patients History and Physical, chart, labs and discussed the procedure including the risks, benefits and alternatives for the proposed anesthesia with the patient or authorized representative who has indicated his/her understanding and acceptance.       Plan Discussed with: CRNA, Anesthesiologist and Surgeon  Anesthesia Plan Comments:         Anesthesia Quick Evaluation

## 2020-05-05 NOTE — Progress Notes (Signed)
Rapid wean began. 

## 2020-05-05 NOTE — Transfer of Care (Signed)
Immediate Anesthesia Transfer of Care Note  Patient: Ryan [name]  Procedure(s) Performed: CORONARY ARTERY BYPASS GRAFTING (CABG) x Three, Using Left internal Mammary Artery and Left Leg greater saphenous vein harvested endoscopically (N/A Chest) TRANSESOPHAGEAL ECHOCARDIOGRAM (TEE) (N/A )  Patient Location: SICU  Anesthesia Type:General  Level of Consciousness: sedated, unresponsive and Patient remains intubated per anesthesia plan  Airway & Oxygen Therapy: Patient remains intubated per anesthesia plan and Patient placed on Ventilator (see vital sign flow sheet for setting)  Post-op Assessment: Report given to RN and Post -op Vital signs reviewed and stable  Post vital signs: Reviewed and stable  Last Vitals:  Vitals Value Taken Time  BP 99/69 05/05/20 1513  Temp 36.6 C 05/05/20 1516  Pulse 94 05/05/20 1516  Resp 16 05/05/20 1516  SpO2 90 % 05/05/20 1516  Vitals shown include unvalidated device data.  Last Pain:  Vitals:   05/05/20 0701  TempSrc:   PainSc: 0-No pain         Complications: No apparent anesthesia complications

## 2020-05-05 NOTE — Anesthesia Procedure Notes (Signed)
Central Venous Catheter Insertion Performed by: Achille Rich, MD, anesthesiologist Start/End6/01/2020 7:50 AM, 05/05/2020 8:01 AM Patient location: Pre-op. Preanesthetic checklist: patient identified, IV checked, site marked, risks and benefits discussed, surgical consent, monitors and equipment checked, pre-op evaluation, timeout performed and anesthesia consent Position: Trendelenburg Lidocaine 1% used for infiltration and patient sedated Hand hygiene performed , maximum sterile barriers used  and Seldinger technique used Catheter size: 8.5 Fr Central line and PA cath was placed.Sheath introducer Swan type:thermodilation Procedure performed using ultrasound guided technique. Ultrasound Notes:anatomy identified, needle tip was noted to be adjacent to the nerve/plexus identified, no ultrasound evidence of intravascular and/or intraneural injection and image(s) printed for medical record Attempts: 1 Following insertion, line sutured, dressing applied and Biopatch. Post procedure assessment: blood return through all ports, free fluid flow and no air  Patient tolerated the procedure well with no immediate complications.

## 2020-05-05 NOTE — Procedures (Signed)
Extubation Procedure Note  Patient Details:   Name: Ryan [name] DOB: 1966/02/24 MRN: [id number]   Airway Documentation:    Vent end date: 05/05/20 Vent end time: 2045   Evaluation  O2 sats: stable throughout Complications: No apparent complications Patient did tolerate procedure well. Bilateral Breath Sounds: Diminished   Yes  Ryan [name] 05/05/2020, 8:50 PM  Pt extubated per rapid wean protocol. Pt able to perform VC of and NIF of -40, pt had positive cuff leak. Pt placed on 4L [place] at this time and is doing well. RT will continue to monitor as needed.

## 2020-05-05 NOTE — Brief Op Note (Addendum)
      [address]       [place] [address]             [phone]     05/05/2020  3:31 PM  PATIENT:  Gavan Nordby  54 y.o. male  PRE-OPERATIVE DIAGNOSIS:  Coronary artery disease  POST-OPERATIVE DIAGNOSIS:  Coronary artery disease  PROCEDURE:  Procedure(s): CORONARY ARTERY BYPASS GRAFTING (CABG) x Three, Using Left internal Mammary Artery and Left Leg greater saphenous vein harvested endoscopically (N/A) TRANSESOPHAGEAL ECHOCARDIOGRAM (TEE) (N/A)  LIMA to  LAD SVG to Distal RCA SVG to Diag1  SURGEON:  Surgeon(s) and Role:    * Delight Ovens, MD - Primary  PHYSICIAN ASSISTANT:  Jari Favre, PA-C   ANESTHESIA:   general  EBL:  761 mL   BLOOD ADMINISTERED:none  DRAINS: routine   LOCAL MEDICATIONS USED:  NONE  SPECIMEN:  No Specimen  DISPOSITION OF SPECIMEN:  N/A  COUNTS:  YES  DICTATION: .Dragon Dictation  PLAN OF CARE: Admit to inpatient   PATIENT DISPOSITION:  ICU - intubated and hemodynamically stable.   Delay start of Pharmacological VTE agent (>24hrs) due to surgical blood loss or risk of bleeding: yes

## 2020-05-06 ENCOUNTER — Encounter: Payer: Self-pay | Admitting: *Deleted

## 2020-05-06 ENCOUNTER — Inpatient Hospital Stay (HOSPITAL_COMMUNITY): Payer: Medicaid Other

## 2020-05-06 DIAGNOSIS — I25110 Atherosclerotic heart disease of native coronary artery with unstable angina pectoris: Secondary | ICD-10-CM

## 2020-05-06 LAB — POCT I-STAT 7, (LYTES, BLD GAS, ICA,H+H)
Acid-Base Excess: 1 mmol/L (ref 0.0–2.0)
Acid-Base Excess: 1 mmol/L (ref 0.0–2.0)
Acid-Base Excess: 1 mmol/L (ref 0.0–2.0)
Acid-base deficit: 2 mmol/L (ref 0.0–2.0)
Bicarbonate: 27.7 mmol/L (ref 20.0–28.0)
Bicarbonate: 25.2 mmol/L (ref 20.0–28.0)
Bicarbonate: 27.1 mmol/L (ref 20.0–28.0)
Bicarbonate: 28.9 mmol/L — ABNORMAL HIGH (ref 20.0–28.0)
Calcium, Ion: 1.22 mmol/L (ref 1.15–1.40)
Calcium, Ion: 1 mmol/L — ABNORMAL LOW (ref 1.15–1.40)
Calcium, Ion: 1.09 mmol/L — ABNORMAL LOW (ref 1.15–1.40)
Calcium, Ion: 1.11 mmol/L — ABNORMAL LOW (ref 1.15–1.40)
HCT: 44 % (ref 39.0–52.0)
HCT: 37 % — ABNORMAL LOW (ref 39.0–52.0)
HCT: 33 % — ABNORMAL LOW (ref 39.0–52.0)
HCT: 35 % — ABNORMAL LOW (ref 39.0–52.0)
Hemoglobin: 15 g/dL (ref 13.0–17.0)
Hemoglobin: 12.6 g/dL — ABNORMAL LOW (ref 13.0–17.0)
Hemoglobin: 11.2 g/dL — ABNORMAL LOW (ref 13.0–17.0)
Hemoglobin: 11.9 g/dL — ABNORMAL LOW (ref 13.0–17.0)
O2 Saturation: 100 %
O2 Saturation: 100 %
O2 Saturation: 100 %
O2 Saturation: 100 %
Potassium: 4 mmol/L (ref 3.5–5.1)
Potassium: 4.1 mmol/L (ref 3.5–5.1)
Potassium: 4.6 mmol/L (ref 3.5–5.1)
Potassium: 4.2 mmol/L (ref 3.5–5.1)
Sodium: 137 mmol/L (ref 135–145)
Sodium: 142 mmol/L (ref 135–145)
Sodium: 136 mmol/L (ref 135–145)
Sodium: 139 mmol/L (ref 135–145)
TCO2: 29 mmol/L (ref 22–32)
TCO2: 27 mmol/L (ref 22–32)
TCO2: 29 mmol/L (ref 22–32)
TCO2: 31 mmol/L (ref 22–32)
pCO2 arterial: 52.2 mmHg — ABNORMAL HIGH (ref 32.0–48.0)
pCO2 arterial: 53 mmHg — ABNORMAL HIGH (ref 32.0–48.0)
pCO2 arterial: 47.4 mmHg (ref 32.0–48.0)
pCO2 arterial: 60.6 mmHg — ABNORMAL HIGH (ref 32.0–48.0)
pH, Arterial: 7.333 — ABNORMAL LOW (ref 7.350–7.450)
pH, Arterial: 7.286 — ABNORMAL LOW (ref 7.350–7.450)
pH, Arterial: 7.365 (ref 7.350–7.450)
pH, Arterial: 7.287 — ABNORMAL LOW (ref 7.350–7.450)
pO2, Arterial: 235 mmHg — ABNORMAL HIGH (ref 83.0–108.0)
pO2, Arterial: 372 mmHg — ABNORMAL HIGH (ref 83.0–108.0)
pO2, Arterial: 268 mmHg — ABNORMAL HIGH (ref 83.0–108.0)
pO2, Arterial: 233 mmHg — ABNORMAL HIGH (ref 83.0–108.0)

## 2020-05-06 LAB — CBC
HCT: 37.5 % — ABNORMAL LOW (ref 39.0–52.0)
HCT: 40.7 % (ref 39.0–52.0)
Hemoglobin: 12.8 g/dL — ABNORMAL LOW (ref 13.0–17.0)
Hemoglobin: 13.8 g/dL (ref 13.0–17.0)
MCH: 32.1 pg (ref 26.0–34.0)
MCH: 32.2 pg (ref 26.0–34.0)
MCHC: 34.1 g/dL (ref 30.0–36.0)
MCHC: 33.9 g/dL (ref 30.0–36.0)
MCV: 94 fL (ref 80.0–100.0)
MCV: 95.1 fL (ref 80.0–100.0)
Platelets: 106 10*3/uL — ABNORMAL LOW (ref 150–400)
Platelets: 112 10*3/uL — ABNORMAL LOW (ref 150–400)
RBC: 3.99 MIL/uL — ABNORMAL LOW (ref 4.22–5.81)
RBC: 4.28 MIL/uL (ref 4.22–5.81)
RDW: 13.2 % (ref 11.5–15.5)
RDW: 13.5 % (ref 11.5–15.5)
WBC: 14.7 10*3/uL — ABNORMAL HIGH (ref 4.0–10.5)
WBC: 15.8 10*3/uL — ABNORMAL HIGH (ref 4.0–10.5)
nRBC: 0 % (ref 0.0–0.2)
nRBC: 0 % (ref 0.0–0.2)

## 2020-05-06 LAB — POCT I-STAT, CHEM 8
BUN: 17 mg/dL (ref 6–20)
BUN: 14 mg/dL (ref 6–20)
BUN: 17 mg/dL (ref 6–20)
BUN: 16 mg/dL (ref 6–20)
BUN: 15 mg/dL (ref 6–20)
Calcium, Ion: 1.24 mmol/L (ref 1.15–1.40)
Calcium, Ion: 1.02 mmol/L — ABNORMAL LOW (ref 1.15–1.40)
Calcium, Ion: 1.21 mmol/L (ref 1.15–1.40)
Calcium, Ion: 1.06 mmol/L — ABNORMAL LOW (ref 1.15–1.40)
Calcium, Ion: 1.11 mmol/L — ABNORMAL LOW (ref 1.15–1.40)
Chloride: 100 mmol/L (ref 98–111)
Chloride: 101 mmol/L (ref 98–111)
Chloride: 98 mmol/L (ref 98–111)
Chloride: 100 mmol/L (ref 98–111)
Chloride: 101 mmol/L (ref 98–111)
Creatinine, Ser: 1 mg/dL (ref 0.61–1.24)
Creatinine, Ser: 0.8 mg/dL (ref 0.61–1.24)
Creatinine, Ser: 1 mg/dL (ref 0.61–1.24)
Creatinine, Ser: 0.9 mg/dL (ref 0.61–1.24)
Creatinine, Ser: 1 mg/dL (ref 0.61–1.24)
Glucose, Bld: 160 mg/dL — ABNORMAL HIGH (ref 70–99)
Glucose, Bld: 131 mg/dL — ABNORMAL HIGH (ref 70–99)
Glucose, Bld: 155 mg/dL — ABNORMAL HIGH (ref 70–99)
Glucose, Bld: 147 mg/dL — ABNORMAL HIGH (ref 70–99)
Glucose, Bld: 178 mg/dL — ABNORMAL HIGH (ref 70–99)
HCT: 43 % (ref 39.0–52.0)
HCT: 38 % — ABNORMAL LOW (ref 39.0–52.0)
HCT: 42 % (ref 39.0–52.0)
HCT: 34 % — ABNORMAL LOW (ref 39.0–52.0)
HCT: 37 % — ABNORMAL LOW (ref 39.0–52.0)
Hemoglobin: 14.6 g/dL (ref 13.0–17.0)
Hemoglobin: 12.9 g/dL — ABNORMAL LOW (ref 13.0–17.0)
Hemoglobin: 14.3 g/dL (ref 13.0–17.0)
Hemoglobin: 11.6 g/dL — ABNORMAL LOW (ref 13.0–17.0)
Hemoglobin: 12.6 g/dL — ABNORMAL LOW (ref 13.0–17.0)
Potassium: 4 mmol/L (ref 3.5–5.1)
Potassium: 4 mmol/L (ref 3.5–5.1)
Potassium: 4 mmol/L (ref 3.5–5.1)
Potassium: 4.6 mmol/L (ref 3.5–5.1)
Potassium: 4.1 mmol/L (ref 3.5–5.1)
Sodium: 137 mmol/L (ref 135–145)
Sodium: 137 mmol/L (ref 135–145)
Sodium: 141 mmol/L (ref 135–145)
Sodium: 136 mmol/L (ref 135–145)
Sodium: 139 mmol/L (ref 135–145)
TCO2: 28 mmol/L (ref 22–32)
TCO2: 27 mmol/L (ref 22–32)
TCO2: 29 mmol/L (ref 22–32)
TCO2: 27 mmol/L (ref 22–32)
TCO2: 29 mmol/L (ref 22–32)

## 2020-05-06 LAB — MAGNESIUM
Magnesium: 2.2 mg/dL (ref 1.7–2.4)
Magnesium: 1.8 mg/dL (ref 1.7–2.4)

## 2020-05-06 LAB — GLUCOSE, CAPILLARY
Glucose-Capillary: 108 mg/dL — ABNORMAL HIGH (ref 70–99)
Glucose-Capillary: 122 mg/dL — ABNORMAL HIGH (ref 70–99)
Glucose-Capillary: 124 mg/dL — ABNORMAL HIGH (ref 70–99)
Glucose-Capillary: 126 mg/dL — ABNORMAL HIGH (ref 70–99)
Glucose-Capillary: 126 mg/dL — ABNORMAL HIGH (ref 70–99)
Glucose-Capillary: 134 mg/dL — ABNORMAL HIGH (ref 70–99)
Glucose-Capillary: 135 mg/dL — ABNORMAL HIGH (ref 70–99)
Glucose-Capillary: 140 mg/dL — ABNORMAL HIGH (ref 70–99)
Glucose-Capillary: 194 mg/dL — ABNORMAL HIGH (ref 70–99)
Glucose-Capillary: 209 mg/dL — ABNORMAL HIGH (ref 70–99)
Glucose-Capillary: 212 mg/dL — ABNORMAL HIGH (ref 70–99)
Glucose-Capillary: 243 mg/dL — ABNORMAL HIGH (ref 70–99)
Glucose-Capillary: 84 mg/dL (ref 70–99)

## 2020-05-06 LAB — ECHO INTRAOPERATIVE TEE
Height: 69 in
Weight: 3424 oz

## 2020-05-06 LAB — BASIC METABOLIC PANEL
Anion gap: 5 (ref 5–15)
Anion gap: 10 (ref 5–15)
BUN: 13 mg/dL (ref 6–20)
BUN: 11 mg/dL (ref 6–20)
CO2: 23 mmol/L (ref 22–32)
CO2: 24 mmol/L (ref 22–32)
Calcium: 7.5 mg/dL — ABNORMAL LOW (ref 8.9–10.3)
Calcium: 8 mg/dL — ABNORMAL LOW (ref 8.9–10.3)
Chloride: 105 mmol/L (ref 98–111)
Chloride: 96 mmol/L — ABNORMAL LOW (ref 98–111)
Creatinine, Ser: 0.85 mg/dL (ref 0.61–1.24)
Creatinine, Ser: 1.04 mg/dL (ref 0.61–1.24)
GFR calc Af Amer: >60 mL/min (ref >60)
GFR calc Af Amer: >60 mL/min (ref >60)
GFR calc non Af Amer: >60 mL/min (ref >60)
GFR calc non Af Amer: >60 mL/min (ref >60)
Glucose, Bld: 112 mg/dL — ABNORMAL HIGH (ref 70–99)
Glucose, Bld: 282 mg/dL — ABNORMAL HIGH (ref 70–99)
Potassium: 4.1 mmol/L (ref 3.5–5.1)
Potassium: 4 mmol/L (ref 3.5–5.1)
Sodium: 133 mmol/L — ABNORMAL LOW (ref 135–145)
Sodium: 130 mmol/L — ABNORMAL LOW (ref 135–145)

## 2020-05-06 LAB — COOXEMETRY PANEL
Carboxyhemoglobin: 1.1 % (ref 0.5–1.5)
Methemoglobin: 0.9 % (ref 0.0–1.5)
O2 Saturation: 57.1 %
Total hemoglobin: 12.8 g/dL (ref 12.0–16.0)

## 2020-05-06 MED ORDER — OXYCODONE HCL 5 MG PO TABS
10.0000 mg | ORAL_TABLET | ORAL | Status: DC | PRN
  Administered 2020-05-06 – 2020-05-12 (×28): 10 mg via ORAL
  Filled 2020-05-06 (×28): qty 2

## 2020-05-06 MED ORDER — ENOXAPARIN SODIUM 40 MG/0.4ML ~~LOC~~ SOLN
40.0000 mg | SUBCUTANEOUS | Status: DC
  Administered 2020-05-06 – 2020-05-10 (×5): 40 mg via SUBCUTANEOUS
  Filled 2020-05-06 (×5): qty 0.4

## 2020-05-06 MED ORDER — INSULIN DETEMIR 100 UNIT/ML ~~LOC~~ SOLN
10.0000 [IU] | Freq: Once | SUBCUTANEOUS | Status: AC
  Administered 2020-05-06: 10 [IU] via SUBCUTANEOUS
  Filled 2020-05-06: qty 0.1

## 2020-05-06 MED ORDER — INSULIN DETEMIR 100 UNIT/ML ~~LOC~~ SOLN
10.0000 [IU] | Freq: Two times a day (BID) | SUBCUTANEOUS | Status: DC
  Administered 2020-05-06: 10 [IU] via SUBCUTANEOUS
  Filled 2020-05-06 (×3): qty 0.1

## 2020-05-06 MED ORDER — PNEUMOCOCCAL VAC POLYVALENT 25 MCG/0.5ML IJ INJ
0.5000 mL | INJECTION | INTRAMUSCULAR | Status: AC
  Administered 2020-05-11: 0.5 mL via INTRAMUSCULAR
  Filled 2020-05-06 (×2): qty 0.5

## 2020-05-06 MED ORDER — FUROSEMIDE 10 MG/ML IJ SOLN
40.0000 mg | Freq: Once | INTRAMUSCULAR | Status: AC
  Administered 2020-05-06: 40 mg via INTRAVENOUS
  Filled 2020-05-06: qty 4

## 2020-05-06 MED ORDER — INSULIN ASPART 100 UNIT/ML ~~LOC~~ SOLN
0.0000 [IU] | SUBCUTANEOUS | Status: DC
  Administered 2020-05-06 (×2): 8 [IU] via SUBCUTANEOUS
  Administered 2020-05-06: 4 [IU] via SUBCUTANEOUS
  Administered 2020-05-07: 12 [IU] via SUBCUTANEOUS
  Administered 2020-05-07 (×2): 8 [IU] via SUBCUTANEOUS
  Administered 2020-05-07 (×3): 12 [IU] via SUBCUTANEOUS
  Administered 2020-05-08: 4 [IU] via SUBCUTANEOUS
  Administered 2020-05-08: 8 [IU] via SUBCUTANEOUS

## 2020-05-06 MED ORDER — INSULIN DETEMIR 100 UNIT/ML ~~LOC~~ SOLN: 10.0000 [IU] | Freq: Every day | SUBCUTANEOUS | Status: DC

## 2020-05-06 MED ORDER — ORAL CARE MOUTH RINSE
15.0000 mL | Freq: Two times a day (BID) | OROMUCOSAL | Status: DC
  Administered 2020-05-06 – 2020-05-08 (×5): 15 mL via OROMUCOSAL

## 2020-05-06 NOTE — Discharge Summary (Signed)
[address]       [place] [address]             [phone]    Physician Discharge Summary  Patient ID: Ryan [name] MRN: [id number] DOB/AGE: 54/22/1967 54 y.o.  Admit date: 05/05/2020 Discharge date: 05/12/2020  Admission Diagnoses:  Multivessel coronary artery History of STEMI in 2013 treated with PTCI and drug-eluting stent to LAD  Unstable angina pectoris Ischemic cardiomyopathy Diabetes mellitus Hypertension Dyslipidemia History of tobacco abuse Peripheral vascular disease Status post aortobifemoral bypass in 2014, left carotid to subclavian bypass and 2014, and right femoral to above-the-knee popliteal bypass with saphenous vein in 2019 Cerebrovascular disease with carotid artery stenosis Peripheral neuropathy   Discharge Diagnoses:   Multivessel coronary artery History of STEMI in 2013 treated with PTCI and drug-eluting stent to LAD  Unstable angina pectoris Ischemic cardiomyopathy Diabetes mellitus S/P CABG x 3 Postoperative atrial fibrillation Hypertension Dyslipidemia History of tobacco abuse Peripheral vascular disease Status post aortobifemoral bypass in 2014, left carotid to subclavian bypass and 2014, and right femoral to above-the-knee popliteal bypass with saphenous vein in 2019 Cerebrovascular disease with carotid artery stenosis Peripheral neuropathy Post-operative non-sustained ventricular tachycardia    Discharged Condition: good  HPI:   Ryan [name] 53 y.o. male is seen in the office  today for consideration of CABG. the patient is currently on Brilinta and aspirin and Imdur.  Because of recurrent anginal symptoms and need for as needed nitroglycerin several times a week he underwent repeat cardiac catheterization as an outpatient last week.  He comes into the office today referred for consideration of coronary artery bypass grafting,    He has a history of on going tobacco abuse, CAD, HLD, PAD, HLD, ischemic  cardiomyopathy and insulin dependent diabetes mellitus  Previous history includes an anterior STEMI in January 2013 with cardiac arrest and found to have an occluded mid LAD treated with a drug eluting stent. The third OM branch was chronically occluded and the RCA had mild disease. His post MI LVEF was 35% but this was found to be normal on follow up echo.   Cardiac cath in November 2017 with severe stent restenosis in the LAD, treated with a cutting balloon angioplasty.    Admitted to [facility] in August 2018 with an anterior STEMI. His LAD was occluded proximal to the old stent and this was treated with a drug eluting stent. Angioplasty performed on the ostial Diagonal branch. LVEF was 40% following the MI.   He had unstable angina in June 2020 and repeat cardiac cath showed severe restenosis in the mid LAD stented segment which was treated with cutting balloon angioplasty. He also had ostialstenosisof the diagonal branch (jailed by LAD stents), mild to moderate non-obstructive disease in the mid RCAand moderate LV systolic dysfunction with anteroapical hypokinesis. LVEF=35-45%.He was placed on DAPT w/ ASA andBrilintaand continued on statin, ?blocker, ACEi and long acting nitrate   He has a history of severe peripheral vascular and cerebrovascular disease is followed  by Dr. Myra Gianotti. In 2013 he was found to have occlusion of his distal aorta and left subclavian artery. He underwent aortobifemoral bypass in February 2014 and left carotid to subclavian bypass in May 2014. InApril2019,he underwent right femoral to AK popliteal bypass with saphenous vein and right femoral endarterectomy   Hospital Course:   On 05/05/2020 Mr. Ryan [name] underwent a CABG x 3 with Dr. Tyrone Sage. He tolerated the procedure well and was transferred to the surgical  ICU for continued care. He was extubated in a timely manner. POD 1 he remained on some milrinone. We were weaning his oxygen requirements  as tolerated. We discontinue his lines and some chest tubes.  He was mobilized early postoperatively and made expected progress.  Inotropic support was provided with milrinone for the first few days following surgery.  this was gradually weaned off while careful attention was given to daily coox assay.  He developed atrial fibrillation that was managed with amiodarone initially as an intravenous infusion and later transitioned to oral amiodarone.  This resulted in fairly quick conversion back to sinus rhythm.  On postop day 4, he was transferred to progressive care.  His pacing wires were removed without incident.  Diet and activity were advanced and well-tolerated.  He quickly regained independence with his mobility after transfer to progressive care.  He was diuresed and had good response to Lasix.  He was started back on his carvedilol.  At around midnight on postop day 4, the day prior to his anticipated discharge, he had a 13 beat run of ventricular tachycardia.  This was completely asymptomatic and this occurred during his sleep.  His potassium at the time was 4.5.  Magnesium checked first thing the following morning was 1.8 so this was replaced.  We requested cardiology evaluation.  Further work-up included an EKG to check his QT interval and an echocardiogram to establish his current LV function since his EF on intraoperative TEE was reported at 25 to 30%.  Repeat Echocardiogram was performed and showed an EF of 30-35%.  There was also evidence of a LV apical thrombus.  He was started on Eliquis for this.  His TSH level was also checked and showed TSH of 6.0.  He will require repeat level with his primary care physician.  He continues to have brief runs of NSVT.  He will be placed in a life vest per Cardiology recommendations.  He is medically stable.  His incisions are healing without evidence of infection.  He is ambulating independently.  He is medically stable for discharge home today.    Consults:  cardiology  Significant Diagnostic Studies:  EXAM: CHEST - 2 VIEW  COMPARISON:  Radiograph 05/09/2020  FINDINGS: Interval removal of a right IJ catheter sheath. Persistent bilateral effusions as well as a small left apical pneumothorax. No visible right pleural gas. Some streaky and gradient opacities in the lung bases compatible with a combination of atelectasis and layering pleural fluid. Postsurgical changes related to prior CABG including intact and aligned sternotomy wires and multiple surgical clips projecting over the mediastinum. Abandoned epicardial pacer leads noted over the upper abdomen. Telemetry leads overlie the chest. No other acute osseous or soft tissue abnormality.  IMPRESSION: 1. Persistent bilateral effusions with small left apical pneumothorax component. 2. Bibasilar atelectasis and layering pleural fluid. 3. Postsurgical changes from prior CABG. 4. Removal of a right IJ catheter.   Electronically Signed   By: Kreg Shropshire M.D.   On: 05/10/2020 06:42   Treatments:   OPERATIVE REPORT  DATE OF PROCEDURE:  05/05/2020  PREOPERATIVE DIAGNOSIS:  Severe left ventricular dysfunction with restenosis of left anterior descending stents and progression of coronary artery disease.  POSTOPERATIVE DIAGNOSIS:  Severe left ventricular dysfunction with restenosis of left anterior descending stents and progression of coronary artery disease.  SURGICAL PROCEDURE:  Coronary artery bypass grafting x3 with the left internal mammary to the left anterior descending coronary artery, reverse saphenous vein graft to the diagonal coronary artery,  reverse saphenous vein graft to the distal right  coronary artery, with left thigh endoscopic vein harvesting of the greater saphenous vein.  SURGEON:  Sheliah Plane, MD  FIRST ASSISTANT:  Jari Favre, PA  BRIEF HISTORY:  The patient is a 54 year old male with poorly controlled diabetes, hemoglobin A1c of 11, who for  the past 10 years has been treated intermittently for significant coronary disease including initial presentation with acute anterior  myocardial infarction when stents were placed.  He has had repeat stents with restenosis.  Recent cardiac catheterization by Dr. Madlyn Frankel because of increasing anginal pain revealed complex proximal LAD stenosis in the stent with addition of  progression of disease.  Further stenting and possible occlusion of the diagonal artery was considered and the patient was referred for coronary artery bypass grafting.  Overall, ventricular function is depressed with anterior apical hypokinesis.   Overall, ejection fraction between 30 and 35%.  Because of the patient's symptoms, progression of disease in the LAD, diagonal system, and also 60% stenosis of the right coronary artery with depressed LV function coronary artery bypass grafting was  recommended to the patient who agreed and signed informed consent.  He had been maintained on Brilinta.  This was held for 5 days preoperatively.  Discharge Exam: Blood pressure 125/78, pulse 77, temperature 97.8 F (36.6 C), temperature source Oral, resp. rate 15, height 5\' 9"  (1.753 m), weight 100.5 kg, SpO2 96 %.   General appearance: alert, cooperative and no distress Heart: regular rate and rhythm Lungs: clear to auscultation bilaterally Abdomen: soft, non-tender; bowel sounds normal; no masses,  no organomegaly Extremities: extremities normal, atraumatic, no cyanosis or edema Wound: clean and dry  Discharge Medications:  Discharge Instructions    Amb Referral to Cardiac Rehabilitation   Complete by: As directed    Will send Cardiac Rehab Phase 2 referral to [place]   Diagnosis: CABG   CABG X ___: 3   After initial evaluation and assessments completed: Virtual Based Care may be provided alone or in conjunction with Phase 2 Cardiac Rehab based on patient barriers.: Yes     Allergies as of 05/12/2020      Reactions    Hydrocodone Hives, Nausea And Vomiting      Medication List    STOP taking these medications   Brilinta 90 MG Tabs tablet Generic drug: ticagrelor   isosorbide mononitrate 60 MG 24 hr tablet Commonly known as: IMDUR     TAKE these medications   amiodarone 200 MG tablet Commonly known as: PACERONE Take 2 tablets (400 mg total) by mouth 2 (two) times daily. For 5 days, then decrease to 200 mg BID x 7 days, then decrease to 200 mg daily   apixaban 5 MG Tabs tablet Commonly known as: ELIQUIS Take 1 tablet (5 mg total) by mouth 2 (two) times daily.   aspirin EC 81 MG tablet Take 1 tablet (81 mg total) by mouth daily.   carvedilol 12.5 MG tablet Commonly known as: COREG Take 1 tablet (12.5 mg total) by mouth 2 (two) times daily with a meal. What changed:   medication strength  when to take this   cyclobenzaprine 10 MG tablet Commonly known as: FLEXERIL Take 1 tablet (10 mg total) by mouth 2 (two) times daily as needed for muscle spasms.   enalapril 5 MG tablet Commonly known as: VASOTEC Take 1 tablet (5 mg total) by mouth daily. What changed: when to take this   furosemide 40 MG tablet Commonly known as:  LASIX Take 1 tablet (40 mg total) by mouth daily.   gabapentin 300 MG capsule Commonly known as: NEURONTIN Take 300 mg by mouth 3 (three) times daily.   insulin lispro 100 UNIT/ML injection Commonly known as: HUMALOG Inject into the skin 3 (three) times daily before meals. 10-15 units sliding scale prn   Lantus SoloStar 100 UNIT/ML Solostar Pen Generic drug: insulin glargine Inject 80 Units into the skin at bedtime.   nitroGLYCERIN 0.4 MG SL tablet Commonly known as: NITROSTAT Place 1 tablet (0.4 mg total) under the tongue every 5 (five) minutes as needed for chest pain.   oxyCODONE-acetaminophen 10-325 MG tablet Commonly known as: PERCOCET Take 1 tablet by mouth every 4 (four) hours as needed for pain.   pantoprazole 40 MG tablet Commonly known as:  PROTONIX Take 40 mg by mouth daily.   potassium chloride SA 20 MEQ tablet Commonly known as: KLOR-CON Take 1 tablet (20 mEq total) by mouth daily.   simvastatin 80 MG tablet Commonly known as: ZOCOR Take 80 mg by mouth at bedtime.   traZODone 100 MG tablet Commonly known as: DESYREL Take 100 mg by mouth at bedtime as needed for sleep.   Vitamin D (Ergocalciferol) 1.25 MG (50000 UNIT) Caps capsule Commonly known as: DRISDOL Take 50,000 Units by mouth once a week.            Durable Medical Equipment  (From admission, onward)         Start     Ordered   05/10/20 1145  For home use only DME Walker rolling  Once    Comments: Post op  Question Answer Comment  Walker: With 5 Inch Wheels   Patient needs a walker to treat with the following condition Weakness      05/10/20 1144         Follow-up Information    Fleet Contras, MD. Call in 1 day(s).   Specialty: Internal Medicine Contact information: [address] [place] [address] [phone]        Kathleene Hazel, MD .   Specialty: Cardiology Contact information: [address] [place] [address] [phone]        Delight Ovens, MD Follow up.   Specialty: Cardiothoracic Surgery Why: Your appointment is on  06/03/2020 at 1:00pm. Please arrive at 12:30pm for a chest xray located at [facility] which is on the first floor of our building.  Contact information: [address] [employer] [address] [place] [address] [phone]        [facility] Follow up.   Why: rolling walker arranged- to be delivered to room prior to discharge Contact information: [address] [place] [address] [phone]        [facility] [place]. Go on 05/18/2020.   Specialty: Cardiothoracic Surgery Why: Your appointment for suture removal is at 11:15 am.  Contact information: [address] [place], [address] [place] [address] [phone]         The patient has been discharged on:   1.Beta Blocker:  Yes [ yes  ]                              No   [   ]  If No, reason:  2.Ace Inhibitor/ARB: Yes Valu.Nieves   ]                                     No  [    ]                                     If No, reason:  3.Statin:   Yes [ yes  ]                  No  [   ]                  If No, reason:  4.Ecasa:  Yes  [ yes  ]                  No   [   ]                  If No, reason:    Signed: Update by: Ellwood Handler, PA-C  Original Note By:  Enid Cutter PA-C [phone] 05/12/2020, 9:55 AM

## 2020-05-06 NOTE — Progress Notes (Addendum)
Ryan [name] [address]       [place] [address]             [phone]      1 Day Post-Op Procedure(s) (LRB): CORONARY ARTERY BYPASS GRAFTING (CABG) x Three, Using Left internal Mammary Artery and Left Leg greater saphenous vein harvested endoscopically (N/A) TRANSESOPHAGEAL ECHOCARDIOGRAM (TEE) (N/A) Subjective: Having some sternal pain. Otherwise, doing well.   Objective: Vital signs in last 24 hours: Temp:  [97.5 F (36.4 C)-98.6 F (37 C)] 98.4 F (36.9 C) (06/03 0700) Pulse Rate:  [90-117] 106 (06/03 0700) Cardiac Rhythm: Normal sinus rhythm;Sinus tachycardia (06/03 0400) Resp:  [12-33] 25 (06/03 0700) BP: (87-184)/(59-95) 118/75 (06/03 0700) SpO2:  [91 %-99 %] 96 % (06/03 0700) Arterial Line BP: (80-137)/(50-75) 123/67 (06/03 0700) FiO2 (%):  [40 %-50 %] 40 % (06/02 2013) Weight:  [103.4 kg] 103.4 kg (06/03 0500)  Hemodynamic parameters for last 24 hours: PAP: (24-70)/(14-55) 31/21 CO:  [4.1 L/min-5.7 L/min] 4.4 L/min CI:  [1.9 L/min/m2-2.7 L/min/m2] 2 L/min/m2  Intake/Output from previous day: 06/02 0701 - 06/03 0700 In: 5837.9 [P.O.:500; I.V.:3506.5; Blood:558; IV Piggyback:1273.4] Out: 3235 [Urine:1950; Emesis/NG output:60; Blood:761; Chest Tube:500] Intake/Output this shift: No intake/output data recorded.  General appearance: alert, cooperative and no distress Heart: regular rate and rhythm, S1, S2 normal, no murmur, click, rub or gallop Lungs: clear to auscultation bilaterally and diminished in the lower lobes Abdomen: soft, non-tender; bowel sounds normal; no masses,  no organomegaly Extremities: extremities normal, atraumatic, no cyanosis or edema Wound: clean and dry  Lab Results: Recent Labs    05/05/20 2110 05/05/20 2110 05/05/20 2144 05/06/20 0435  WBC 15.2*  --   --  14.7*  HGB 13.4   < > 13.3 12.8*  HCT 39.5   < > 39.0 37.5*  PLT 126*  --   --  106*   < > = values in this interval not displayed.   BMET:  Recent Labs   05/05/20 2110 05/05/20 2110 05/05/20 2144 05/06/20 0435  NA 135   < > 140 133*  K 4.1   < > 4.1 4.1  CL 107  --   --  105  CO2 23  --   --  23  GLUCOSE 173*  --   --  112*  BUN 14  --   --  13  CREATININE 0.92  --   --  0.85  CALCIUM 7.6*  --   --  7.5*   < > = values in this interval not displayed.    PT/INR:  Recent Labs    05/05/20 1500  LABPROT 15.4*  INR 1.3*   ABG    Component Value Date/Time   PHART 7.373 05/05/2020 2144   HCO3 23.8 05/05/2020 2144   TCO2 25 05/05/2020 2144   ACIDBASEDEF 2.0 05/05/2020 2144   O2SAT 93.0 05/05/2020 2144   CBG (last 3)  Recent Labs    05/06/20 0439 05/06/20 0545 05/06/20 0648  GLUCAP 108* 126* 122*    Assessment/Plan: S/P Procedure(s) (LRB): CORONARY ARTERY BYPASS GRAFTING (CABG) x Three, Using Left internal Mammary Artery and Left Leg greater saphenous vein harvested endoscopically (N/A) TRANSESOPHAGEAL ECHOCARDIOGRAM (TEE) (N/A)  1. CV-ST in the low 100s, BP well controlled.  get a coox this morning.  2. Pulm-Tolerating 5L [place]. Weaning as able. Continue breathing treatments.  3. Renal-creatinine 0.85, electrolytes okay 4. Endo-blood glucose well controlled 5. H and H 12.8/37.5, expected acute blood loss anemia  Plan:  See progression orders. Arterial line, swan, and mediastinal chest tubes removed today.  Weaning oxygen as able. Added back oxycodone.    LOS: 1 day    Sharlene Dory 05/06/2020  Work on pulmonary effort  Continue milrinone for now and follow coox I have seen and examined [name] and agree with the above assessment  and plan.  Delight Ovens MD [facility] [phone] Office [phone] 05/06/2020 8:23 AM

## 2020-05-06 NOTE — Progress Notes (Signed)
Progress Note  Patient Name: Ryan [name] Date of Encounter: 05/06/2020  Primary Cardiologist: [name], MD   Subjective   Chest wall sore but no other complaints.   Inpatient Medications    Scheduled Meds: . acetaminophen  1,000 mg Oral Q6H   Or  . acetaminophen (TYLENOL) oral liquid 160 mg/5 mL  1,000 mg Per Tube Q6H  . aspirin EC  325 mg Oral Daily   Or  . aspirin  324 mg Per Tube Daily  . atorvastatin  40 mg Oral Daily  . bisacodyl  10 mg Oral Daily   Or  . bisacodyl  10 mg Rectal Daily  . Chlorhexidine Gluconate Cloth  6 each Topical Daily  . Chlorhexidine Gluconate Cloth  6 each Topical Daily  . docusate sodium  200 mg Oral Daily  . enoxaparin (LOVENOX) injection  40 mg Subcutaneous Q24H  . furosemide  40 mg Intravenous Once  . gabapentin  300 mg Oral TID  . insulin aspart  0-24 Units Subcutaneous Q4H  . insulin detemir  10 Units Subcutaneous Once  . [START ON 05/07/2020] insulin detemir  10 Units Subcutaneous Daily  . levalbuterol  0.63 mg Nebulization Q6H  . mouth rinse  15 mL Mouth Rinse BID  . metoprolol tartrate  12.5 mg Oral BID   Or  . metoprolol tartrate  12.5 mg Per Tube BID  . [START ON 05/07/2020] pantoprazole  40 mg Oral Daily  . sodium chloride flush  10-40 mL Intracatheter Q12H  . sodium chloride flush  3 mL Intravenous Q12H   Continuous Infusions: . sodium chloride Stopped (05/05/20 2001)  . sodium chloride 20 mL/hr at 05/06/20 0700  . sodium chloride 10 mL/hr at 05/05/20 1533  . albumin human 12.5 g (05/05/20 1625)  . cefUROXime (ZINACEF)  IV 1.5 g (05/06/20 0846)  . dexmedetomidine (PRECEDEX) IV infusion Stopped (05/05/20 2114)  . DOPamine    . lactated ringers    . lactated ringers Stopped (05/06/20 0514)  . lactated ringers 20 mL/hr at 05/06/20 0500  . milrinone 0.3 mcg/kg/min (05/06/20 0736)  . nitroGLYCERIN Stopped (05/05/20 1716)  . norepinephrine (LEVOPHED) Adult infusion Stopped (05/05/20 2152)  . phenylephrine  (NEO-SYNEPHRINE) Adult infusion     PRN Meds: sodium chloride, albumin human, dextrose, fentaNYL (SUBLIMAZE) injection, lactated ringers, metoprolol tartrate, midazolam, ondansetron (ZOFRAN) IV, oxyCODONE, sodium chloride flush, sodium chloride flush, traMADol   Vital Signs    Vitals:   05/06/20 0500 05/06/20 0600 05/06/20 0700 05/06/20 0800  BP: (!) 87/73 114/70 118/75 111/81  Pulse: (!) 101 (!) 112 (!) 106 (!) 107  Resp: (!) 23 (!) 33 (!) 25 (!) 26  Temp: 98.4 F (36.9 C) 98.4 F (36.9 C) 98.4 F (36.9 C) 98.6 F (37 C)  TempSrc:      SpO2: 97% 99% 96% 94%  Weight: 103.4 kg     Height:        Intake/Output Summary (Last 24 hours) at 05/06/2020 0917 Last data filed at 05/06/2020 0700 Gross per 24 hour  Intake 5437.89 ml  Output 3271 ml  Net 2166.89 ml   Last 3 Weights 05/06/2020 05/05/2020 04/30/2020  Weight (lbs) 228 lb 214 lb 214 lb 15.2 oz  Weight (kg) 103.42 kg 97.07 kg 97.5 kg      Telemetry    Sinus tach - Personally Reviewed  ECG    Sinus tach, poor R wave progression precordial leads, inferolateral T wave inversions - Personally Reviewed  Physical Exam   GEN: No acute  distress.   Neck: No JVD Cardiac: Tachy, no murmurs, rubs, or gallops.  Respiratory: Clear to auscultation bilaterally. GI: Soft, nontender, non-distended  MS: No edema; No deformity. Neuro:  Nonfocal  Psych: Normal affect   Labs    High Sensitivity Troponin:  No results for input(s): TROPONINIHS in the last 720 hours.    Chemistry Recent Labs  Lab 04/30/20 0855 05/05/20 0907 05/05/20 1352 05/05/20 1525 05/05/20 2110 05/05/20 2144 05/06/20 0435  NA 136   < > 139   < > 135 140 133*  K 4.2   < > 4.1   < > 4.1 4.1 4.1  CL 104   < > 101  --  107  --  105  CO2 23  --   --   --  23  --  23  GLUCOSE 129*   < > 178*  --  173*  --  112*  BUN 15   < > 15  --  14  --  13  CREATININE 0.91   < > 1.00  --  0.92  --  0.85  CALCIUM 8.6*  --   --   --  7.6*  --  7.5*  PROT 6.9  --   --   --    --   --   --   ALBUMIN 3.8  --   --   --   --   --   --   AST 35  --   --   --   --   --   --   ALT 48*  --   --   --   --   --   --   ALKPHOS 123  --   --   --   --   --   --   BILITOT 0.8  --   --   --   --   --   --   GFRNONAA >60  --   --   --  >60  --  >60  GFRAA >60  --   --   --  >60  --  >60  ANIONGAP 9  --   --   --  5  --  5   < > = values in this interval not displayed.     Hematology Recent Labs  Lab 05/05/20 1500 05/05/20 1525 05/05/20 2110 05/05/20 2144 05/06/20 0435  WBC 20.1*  --  15.2*  --  14.7*  RBC 4.35  --  4.14*  --  3.99*  HGB 13.8   < > 13.4 13.3 12.8*  HCT 42.1   < > 39.5 39.0 37.5*  MCV 96.8  --  95.4  --  94.0  MCH 31.7  --  32.4  --  32.1  MCHC 32.8  --  33.9  --  34.1  RDW 13.4  --  13.3  --  13.2  PLT 134*  --  126*  --  106*   < > = values in this interval not displayed.    BNPNo results for input(s): BNP, PROBNP in the last 168 hours.   DDimer No results for input(s): DDIMER in the last 168 hours.   Radiology    DG Chest Port 1 View  Result Date: 05/05/2020 CLINICAL DATA:  Status post CABG today. EXAM: PORTABLE CHEST 1 VIEW COMPARISON:  PA and lateral chest 04/30/2020. FINDINGS: The patient is status post CABG since the prior study. ETT is in good position with  the tip at the level of the clavicular heads. NG tube sideport is in the mid esophagus. The tube should be advanced approximately 20 cm. Right IJ Theone Murdoch catheter is in the distal pulmonary outflow tract. Left chest and mediastinal drain are noted. No pneumothorax. Both lungs are clear. The visualized skeletal structures are unremarkable. IMPRESSION: NG tube side port is in the mid esophagus. Recommend advancement of approximately 20 cm. Support tubes and lines otherwise project in good position. Negative for pneumothorax.  No acute disease. Electronically Signed   By: Drusilla Kanner M.D.   On: 05/05/2020 16:03   ECHOCARDIOGRAM COMPLETE  Result Date: 05/04/2020    ECHOCARDIOGRAM  REPORT   Patient Name:   Ryan [name] Date of Exam: 05/04/2020 Medical Rec #:  [id number]    Height:       69.0 in Accession #:    [id number]   Weight:       214.9 lb Date of Birth:  05/25/1966   BSA:          2.130 m Patient Age:    53 years     BP:           105/76 mmHg Patient Gender: M            HR:           89 bpm. Exam Location:  Outpatient Procedure: 2D Echo, Cardiac Doppler and Color Doppler Indications:    Pre-op evaluation                 CAD (coronary artery disease)  History:        Patient has prior history of Echocardiogram examinations, most                 recent 02/16/2012. Cardiomyopathy, Acute MI and CAD,                 Signs/Symptoms:Chest Pain; Risk Factors:Hypertension,                 Dyslipidemia and Current Smoker. PVD. PAD.  Sonographer:    Renella Cunas RDCS Referring Phys: [phone] EDWARD B GERHARDT IMPRESSIONS  1. The mid septum, apical septum, and apex segments are akinetic. Would recommend a limited contrast echo to exclude apical thrombus as this is not well visualized on the current study. EF 40-45%. Findings consistent with prior LAD infarction.  2. Left ventricular ejection fraction, by estimation, is 40 to 45%. The left ventricle has mildly decreased function. The left ventricle demonstrates regional wall motion abnormalities (see scoring diagram/findings for description). Left ventricular diastolic parameters are consistent with Grade III diastolic dysfunction (restrictive).  3. Right ventricular systolic function is normal. The right ventricular size is normal. Tricuspid regurgitation signal is inadequate for assessing PA pressure.  4. The mitral valve is grossly normal. Trivial mitral valve regurgitation. No evidence of mitral stenosis.  5. The aortic valve is tricuspid. Aortic valve regurgitation is not visualized. Mild aortic valve sclerosis is present, with no evidence of aortic valve stenosis. Comparison(s): Changes from prior study are noted. Conclusion(s)/Recommendation(s):  Findings consistent with ischemic cardiomyopathy. The mid septum, apical septum, and apex segments are akinetic. Would recommend a limited contrast echo to exclude apical thrombus as this is not well visualized on the current study. FINDINGS  Left Ventricle: Left ventricular ejection fraction, by estimation, is 40 to 45%. The left ventricle has mildly decreased function. The left ventricle demonstrates regional wall motion abnormalities. The left ventricular internal cavity size was normal in size.  There is no left ventricular hypertrophy. Left ventricular diastolic parameters are consistent with Grade III diastolic dysfunction (restrictive).  LV Wall Scoring: The mid and distal anterior septum and apex are akinetic. Right Ventricle: The right ventricular size is normal. No increase in right ventricular wall thickness. Right ventricular systolic function is normal. Tricuspid regurgitation signal is inadequate for assessing PA pressure. Left Atrium: Left atrial size was normal in size. Right Atrium: Right atrial size was normal in size. Pericardium: There is no evidence of pericardial effusion. Mitral Valve: The mitral valve is grossly normal. Trivial mitral valve regurgitation. No evidence of mitral valve stenosis. Tricuspid Valve: The tricuspid valve is grossly normal. Tricuspid valve regurgitation is trivial. No evidence of tricuspid stenosis. Aortic Valve: The aortic valve is tricuspid. Aortic valve regurgitation is not visualized. Mild aortic valve sclerosis is present, with no evidence of aortic valve stenosis. Pulmonic Valve: The pulmonic valve was grossly normal. Pulmonic valve regurgitation is not visualized. No evidence of pulmonic stenosis. Aorta: The aortic root is normal in size and structure. IAS/Shunts: The atrial septum is grossly normal.  LEFT VENTRICLE PLAX 2D LVIDd:         5.50 cm      Diastology LVIDs:         4.60 cm      LV e' lateral:   9.14 cm/s LV PW:         0.70 cm      LV E/e' lateral:  10.2 LV IVS:        0.70 cm      LV e' medial:    7.40 cm/s LVOT diam:     2.10 cm      LV E/e' medial:  12.6 LV SV:         52 LV SV Index:   24 LVOT Area:     3.46 cm  LV Volumes (MOD) LV vol d, MOD A2C: 138.0 ml LV vol d, MOD A4C: 132.0 ml LV vol s, MOD A2C: 80.0 ml LV vol s, MOD A4C: 80.9 ml LV SV MOD A2C:     58.0 ml LV SV MOD A4C:     132.0 ml LV SV MOD BP:      55.1 ml RIGHT VENTRICLE RV S prime:     12.30 cm/s TAPSE (M-mode): 2.0 cm LEFT ATRIUM           Index       RIGHT ATRIUM           Index LA diam:      3.80 cm 1.78 cm/m  RA Area:     12.20 cm LA Vol (A2C): 21.8 ml 10.23 ml/m RA Volume:   26.20 ml  12.30 ml/m LA Vol (A4C): 41.3 ml 19.39 ml/m  AORTIC VALVE LVOT Vmax:   84.20 cm/s LVOT Vmean:  57.000 cm/s LVOT VTI:    0.150 m  AORTA Ao Root diam: 2.80 cm MITRAL VALVE MV Area (PHT): 6.12 cm    SHUNTS MV Decel Time: 124 msec    Systemic VTI:  0.15 m MV E velocity: 93.40 cm/s  Systemic Diam: 2.10 cm MV A velocity: 43.30 cm/s MV E/A ratio:  2.16 Lennie Odor MD Electronically signed by Lennie Odor MD Signature Date/Time: 05/04/2020/5:31:05 PM    Final    ECHO INTRAOPERATIVE TEE  Result Date: 05/06/2020  *INTRAOPERATIVE TRANSESOPHAGEAL REPORT *  Patient Name:   Ryan [name]   Date of Exam: 05/05/2020 Medical Rec #:  [id number]  Height:       69.0 in Accession #:    [id number]     Weight:       214.0 lb Date of Birth:  02-24-1966     BSA:          2.13 m Patient Age:    53 years       BP:           105/69 mmHg Patient Gender: M              HR:           79 bpm. Exam Location:  Anesthesiology Transesophogeal exam was perform intraoperatively during surgical procedure. Patient was closely monitored under general anesthesia during the entirety of examination. Indications:     CAD Native Vessel Sonographer:     Irving Burton Senior RDCS Performing Phys: [phone] Gwenith Daily [id number] Diagnosing Phys: Achille Rich MD Complications: No known complications during this procedure. POST-OP IMPRESSIONS Overall, there were  no significant changes from pre-bypass. PRE-OP FINDINGS  Left Ventricle: The left ventricle has severely reduced systolic function, with an ejection fraction of 25-30%. The cavity size was mildly dilated. There is no increase in left ventricular wall thickness. Left ventricular diffuse hypokinesis. Global HK with areas of akinesis including the apex, apical anterior, and apical septal segments. There is a false chord present in the LV apex. No LV thrombus was seen. Right Ventricle: The right ventricle has normal systolic function. The cavity was normal. There is no increase in right ventricular wall thickness. Left Atrium: Left atrial size was normal in size. Right Atrium: Right atrial size was normal in size. Interatrial Septum: No atrial level shunt detected by color flow Doppler. Pericardium: There is no evidence of pericardial effusion. Mitral Valve: The mitral valve is normal in structure. Mitral valve regurgitation is trivial by color flow Doppler. There is No evidence of mitral stenosis. Tricuspid Valve: The tricuspid valve was normal in structure. Tricuspid valve regurgitation was not visualized by color flow Doppler. Aortic Valve: The aortic valve is normal in structure. Aortic valve regurgitation is trivial by color flow Doppler. There is no evidence of aortic valve stenosis. Pulmonic Valve: The pulmonic valve was normal in structure. Pulmonic valve regurgitation is not visualized by color flow Doppler. Aorta: The ascending aorta and aortic root are normal in size and structure. There is evidence of protruding and atheroma immobile plaque in the descending aorta; Grade IV, measuring >74mm in size. +--------------+--------++ LEFT VENTRICLE         +----------------+---------++ +--------------+--------++ Diastology                PLAX 2D                +----------------+---------++ +--------------+--------++ LV e' lateral:  6.48 cm/s LVIDd:        5.13 cm  +----------------+---------++  +--------------+--------++ LV E/e' lateral:12.1      LVIDs:        4.42 cm  +----------------+---------++ +--------------+--------++ LV e' medial:   8.44 cm/s LVOT diam:    1.90 cm  +----------------+---------++ +--------------+--------++ LV E/e' medial: 9.3       LV SV:        37 ml    +----------------+---------++ +--------------+--------++ LV SV Index:  16.74    +--------------+--------++ LVOT Area:    2.84 cm +--------------+--------++                        +--------------+--------++ +------------+-----------++ AORTIC VALVE            +------------+-----------++  LVOT Vmax:  90.60 cm/s  +------------+-----------++ LVOT Vmean: 58.500 cm/s +------------+-----------++ LVOT VTI:   0.172 m     +------------+-----------++ +--------------+----------++ MITRAL VALVE             +--------------+-------+ +--------------+----------++ SHUNTS                MV Area (PHT):4.15 cm   +--------------+-------+ +--------------+----------++ Systemic VTI: 0.17 m  MV PHT:       53.07 msec +--------------+-------+ +--------------+----------++ Systemic Diam:1.90 cm MV Decel Time:183 msec   +--------------+-------+ +--------------+----------++ +--------------+----------++ MV E velocity:78.30 cm/s +--------------+----------++ MV A velocity:46.30 cm/s +--------------+----------++ MV E/A ratio: 1.69       +--------------+----------++  Albertha Ghee MD Electronically signed by Albertha Ghee MD Signature Date/Time: 05/06/2020/9:09:58 AM    Final    VAS US DOPPLER PRE CABG  Result Date: 05/04/2020 PREOPERATIVE VASCULAR EVALUATION  Indications:  Pre-CABG. Risk Factors: Hypertension, hyperlipidemia, Diabetes, coronary artery disease,               PAD. Performing Technologist: Abram Sander RVS  Examination Guidelines: A complete evaluation includes B-mode imaging, spectral Doppler, color Doppler, and power Doppler as needed of all accessible portions of  each vessel. Bilateral testing is considered an integral part of a complete examination. Limited examinations for reoccurring indications may be performed as noted.  Right Carotid Findings: +----------+--------+--------+--------+------------+--------+           PSV cm/sEDV cm/sStenosisDescribe    Comments +----------+--------+--------+--------+------------+--------+ CCA Prox  106     21              heterogenous         +----------+--------+--------+--------+------------+--------+ CCA Distal68      22              heterogenous         +----------+--------+--------+--------+------------+--------+ ICA Prox  87      34      1-39%   heterogenous         +----------+--------+--------+--------+------------+--------+ ICA Distal76      32                                   +----------+--------+--------+--------+------------+--------+ ECA       144     16                                   +----------+--------+--------+--------+------------+--------+ Portions of this table do not appear on this page. +----------+--------+-------+--------+------------+           PSV cm/sEDV cmsDescribeArm Pressure +----------+--------+-------+--------+------------+ Subclavian135                                 +----------+--------+-------+--------+------------+ +---------+--------+--+--------+--+---------+ VertebralPSV cm/s35EDV cm/s15Antegrade +---------+--------+--+--------+--+---------+ Left Carotid Findings: +----------+--------+--------+--------+------------+--------+           PSV cm/sEDV cm/sStenosisDescribe    Comments +----------+--------+--------+--------+------------+--------+ CCA Prox  147     30              heterogenous         +----------+--------+--------+--------+------------+--------+ CCA Distal81      30              heterogenous         +----------+--------+--------+--------+------------+--------+ ICA Prox  86      48      1-39%  heterogenous         +----------+--------+--------+--------+------------+--------+ ICA Distal83      41                                   +----------+--------+--------+--------+------------+--------+ ECA       137     27                                   +----------+--------+--------+--------+------------+--------+ +----------+--------+--------+--------+------------+ SubclavianPSV cm/sEDV cm/sDescribeArm Pressure +----------+--------+--------+--------+------------+           123                                  +----------+--------+--------+--------+------------+ +---------+--------+--+--------+--+---------+ VertebralPSV cm/s58EDV cm/s14Antegrade +---------+--------+--+--------+--+---------+  ABI Findings: +--------+------------------+-----+---------+--------+ Right   Rt Pressure (mmHg)IndexWaveform Comment  +--------+------------------+-----+---------+--------+ [id number]                    triphasic         +--------+------------------+-----+---------+--------+ PTA     142               0.95 triphasic         +--------+------------------+-----+---------+--------+ DP      136               0.91 triphasic         +--------+------------------+-----+---------+--------+ +--------+------------------+-----+---------+-------+ Left    Lt Pressure (mmHg)IndexWaveform Comment +--------+------------------+-----+---------+-------+ [id number]                    triphasic        +--------+------------------+-----+---------+-------+ PTA     157               1.05 triphasic        +--------+------------------+-----+---------+-------+ DP      145               0.97 triphasic        +--------+------------------+-----+---------+-------+ +-------+---------------+----------------+ ABI/TBIToday's ABI/TBIPrevious ABI/TBI +-------+---------------+----------------+ Right  0.95                            +-------+---------------+----------------+  Left   1.05                            +-------+---------------+----------------+  Right Doppler Findings: +--------+--------+-----+---------+--------+ Site    PressureIndexDoppler  Comments +--------+--------+-----+---------+--------+ [id number]          triphasic         +--------+--------+-----+---------+--------+ Radial               triphasic         +--------+--------+-----+---------+--------+ Ulnar                triphasic         +--------+--------+-----+---------+--------+  Left Doppler Findings: +--------+--------+-----+---------+--------+ Site    PressureIndexDoppler  Comments +--------+--------+-----+---------+--------+ [id number]          triphasic         +--------+--------+-----+---------+--------+ Radial               triphasic         +--------+--------+-----+---------+--------+ Ulnar                triphasic         +--------+--------+-----+---------+--------+  Summary: Right Carotid: Velocities in the right ICA are consistent with a 1-39% stenosis. Left Carotid: Velocities in the left ICA are consistent with a 1-39% stenosis. Vertebrals: Bilateral vertebral arteries demonstrate antegrade flow. Right ABI: Resting right ankle-brachial index is within normal range. No evidence of significant right lower extremity arterial disease. Left ABI: Resting left ankle-brachial index is within normal range. No evidence of significant left lower extremity arterial disease. Right Upper Extremity: Doppler waveforms remain within normal limits with right radial compression. Doppler waveforms remain within normal limits with right ulnar compression. Left Upper Extremity: Doppler waveforms remain within normal limits with left radial compression. Doppler waveforms decrease 50% with left ulnar compression.  Electronically signed by Waverly Ferrari MD on 05/04/2020 at 6:07:48 PM.    Final     Cardiac Studies     Patient Profile     54 y.o. male with DM,  HTN, PAD, CAD with unstable angina, progression of CAD by recent cath  Assessment & Plan    1. CAD with unstable angina: He is now one day post 3V CABG. He is stable. Sinus tach on tele. Renal function is stable.   For questions or updates, please contact [facility] Please consult [email or web address] for contact info under        Signed, [name], MD  05/06/2020, 9:17 AM

## 2020-05-06 NOTE — Plan of Care (Signed)

## 2020-05-06 NOTE — Progress Notes (Signed)
Patient ID: Mukesh Kornegay, male   DOB: 15-Jul-1966, 54 y.o.   MRN: [id number] EVENING ROUNDS NOTE :     [address]       [place] [address]             [phone]                 1 Day Post-Op Procedure(s) (LRB): CORONARY ARTERY BYPASS GRAFTING (CABG) x Three, Using Left internal Mammary Artery and Left Leg greater saphenous vein harvested endoscopically (N/A) TRANSESOPHAGEAL ECHOCARDIOGRAM (TEE) (N/A)  Total Length of Stay:  LOS: 1 day  BP 129/83   Pulse (!) 113   Temp 99.2 F (37.3 C) (Oral)   Resp (!) 30   Ht 5\' 9"  (1.753 m)   Wt 103.4 kg   SpO2 94%   BMI 33.67 kg/m   .Intake/Output      06/02 0701 - 06/03 0700 06/03 0701 - 06/04 0700   P.O. 500 960   I.V. (mL/kg) 3506.5 (33.9) 308.5 (3)   Blood 558    IV Piggyback 1273.4 99.9   Total Intake(mL/kg) 5837.9 (56.5) 1368.4 (13.2)   Urine (mL/kg/hr) 1950 (0.8) 2710 (2.2)   Emesis/NG output 60    Drains 0    Blood 761    Chest Tube 500 270   Total Output 3271 2980   Net +2566.9 -1611.6          . sodium chloride Stopped (05/05/20 2001)  . sodium chloride Stopped (05/06/20 1235)  . sodium chloride 10 mL/hr at 05/05/20 1533  . cefUROXime (ZINACEF)  IV Stopped (05/06/20 0916)  . dexmedetomidine (PRECEDEX) IV infusion Stopped (05/05/20 2114)  . DOPamine    . lactated ringers    . lactated ringers Stopped (05/06/20 0514)  . lactated ringers 20 mL/hr at 05/06/20 1700  . milrinone 0.3 mcg/kg/min (05/06/20 1700)  . nitroGLYCERIN Stopped (05/05/20 1716)  . norepinephrine (LEVOPHED) Adult infusion Stopped (05/05/20 2152)  . phenylephrine (NEO-SYNEPHRINE) Adult infusion       Lab Results  Component Value Date   WBC 15.8 (H) 05/06/2020   HGB 13.8 05/06/2020   HCT 40.7 05/06/2020   PLT 112 (L) 05/06/2020   GLUCOSE 282 (H) 05/06/2020   CHOL 180 11/09/2017   TRIG 299 (H) 11/09/2017   HDL 31 (L) 11/09/2017   LDLCALC 89 11/09/2017   ALT 48 (H) 04/30/2020   AST 35 04/30/2020   NA 130 (L) 05/06/2020     K 4.0 05/06/2020   CL 96 (L) 05/06/2020   CREATININE 1.04 05/06/2020   BUN 11 05/06/2020   CO2 24 05/06/2020   TSH 0.832 01/18/2013   INR 1.3 (H) 05/05/2020   HGBA1C 11.8 (H) 04/30/2020   remains on milrinone Stable day No distress    05/02/2020 MD  Beeper [phone] Office [phone] 05/06/2020 7:00 PM

## 2020-05-06 NOTE — Anesthesia Postprocedure Evaluation (Addendum)
Anesthesia Post Note  Patient: Corwin Kuiken  Procedure(s) Performed: CORONARY ARTERY BYPASS GRAFTING (CABG) x Three, Using Left internal Mammary Artery and Left Leg greater saphenous vein harvested endoscopically (N/A Chest) TRANSESOPHAGEAL ECHOCARDIOGRAM (TEE) (N/A )     Patient location during evaluation: SICU Anesthesia Type: General Level of consciousness: sedated Pain management: pain level controlled Vital Signs Assessment: post-procedure vital signs reviewed and stable Respiratory status: patient remains intubated per anesthesia plan Cardiovascular status: stable Postop Assessment: no apparent nausea or vomiting Anesthetic complications: no    Last Vitals:  Vitals:   05/06/20 0700 05/06/20 0800  BP: 118/75 111/81  Pulse: (!) 106 (!) 107  Resp: (!) 25 (!) 26  Temp: 36.9 C 37 C  SpO2: 96% 94%    Last Pain:  Vitals:   05/06/20 0846  TempSrc:   PainSc: 7                  Dafney Farler S

## 2020-05-07 ENCOUNTER — Inpatient Hospital Stay (HOSPITAL_COMMUNITY): Payer: Medicaid Other

## 2020-05-07 LAB — BASIC METABOLIC PANEL
Anion gap: 11 (ref 5–15)
BUN: 12 mg/dL (ref 6–20)
CO2: 24 mmol/L (ref 22–32)
Calcium: 8.2 mg/dL — ABNORMAL LOW (ref 8.9–10.3)
Chloride: 95 mmol/L — ABNORMAL LOW (ref 98–111)
Creatinine, Ser: 1.1 mg/dL (ref 0.61–1.24)
GFR calc Af Amer: >60 mL/min (ref >60)
GFR calc non Af Amer: >60 mL/min (ref >60)
Glucose, Bld: 270 mg/dL — ABNORMAL HIGH (ref 70–99)
Potassium: 4.3 mmol/L (ref 3.5–5.1)
Sodium: 130 mmol/L — ABNORMAL LOW (ref 135–145)

## 2020-05-07 LAB — GLUCOSE, CAPILLARY
Glucose-Capillary: 209 mg/dL — ABNORMAL HIGH (ref 70–99)
Glucose-Capillary: 249 mg/dL — ABNORMAL HIGH (ref 70–99)
Glucose-Capillary: 256 mg/dL — ABNORMAL HIGH (ref 70–99)
Glucose-Capillary: 263 mg/dL — ABNORMAL HIGH (ref 70–99)
Glucose-Capillary: 269 mg/dL — ABNORMAL HIGH (ref 70–99)
Glucose-Capillary: 287 mg/dL — ABNORMAL HIGH (ref 70–99)

## 2020-05-07 LAB — COOXEMETRY PANEL
Carboxyhemoglobin: 1.6 % — ABNORMAL HIGH (ref 0.5–1.5)
Methemoglobin: 1.1 % (ref 0.0–1.5)
O2 Saturation: 54 %
Total hemoglobin: 14.4 g/dL (ref 12.0–16.0)

## 2020-05-07 LAB — CBC
HCT: 41.5 % (ref 39.0–52.0)
Hemoglobin: 13.8 g/dL (ref 13.0–17.0)
MCH: 31.9 pg (ref 26.0–34.0)
MCHC: 33.3 g/dL (ref 30.0–36.0)
MCV: 96.1 fL (ref 80.0–100.0)
Platelets: 115 10*3/uL — ABNORMAL LOW (ref 150–400)
RBC: 4.32 MIL/uL (ref 4.22–5.81)
RDW: 13.4 % (ref 11.5–15.5)
WBC: 19.9 10*3/uL — ABNORMAL HIGH (ref 4.0–10.5)
nRBC: 0 % (ref 0.0–0.2)

## 2020-05-07 LAB — EXPECTORATED SPUTUM ASSESSMENT W GRAM STAIN, RFLX TO RESP C

## 2020-05-07 LAB — BRAIN NATRIURETIC PEPTIDE: B Natriuretic Peptide: 481.7 pg/mL — ABNORMAL HIGH (ref 0.0–100.0)

## 2020-05-07 MED ORDER — INSULIN DETEMIR 100 UNIT/ML ~~LOC~~ SOLN
25.0000 [IU] | Freq: Two times a day (BID) | SUBCUTANEOUS | Status: DC
  Administered 2020-05-07 – 2020-05-08 (×3): 25 [IU] via SUBCUTANEOUS
  Filled 2020-05-07 (×4): qty 0.25

## 2020-05-07 MED ORDER — CARVEDILOL 6.25 MG PO TABS
6.2500 mg | ORAL_TABLET | Freq: Two times a day (BID) | ORAL | Status: DC
  Administered 2020-05-07 – 2020-05-10 (×8): 6.25 mg via ORAL
  Filled 2020-05-07 (×8): qty 1

## 2020-05-07 MED ORDER — LEVALBUTEROL HCL 0.63 MG/3ML IN NEBU
0.6300 mg | INHALATION_SOLUTION | Freq: Two times a day (BID) | RESPIRATORY_TRACT | Status: DC
  Administered 2020-05-08 – 2020-05-10 (×5): 0.63 mg via RESPIRATORY_TRACT
  Filled 2020-05-07 (×10): qty 3

## 2020-05-07 MED ORDER — FUROSEMIDE 10 MG/ML IJ SOLN
40.0000 mg | Freq: Once | INTRAMUSCULAR | Status: AC
  Administered 2020-05-07: 40 mg via INTRAVENOUS

## 2020-05-07 MED ORDER — FUROSEMIDE 10 MG/ML IJ SOLN
40.0000 mg | Freq: Once | INTRAMUSCULAR | Status: DC
  Filled 2020-05-07: qty 4

## 2020-05-07 MED FILL — Heparin Sodium (Porcine) Inj 1000 Unit/ML: INTRAMUSCULAR | Qty: 30 | Status: AC

## 2020-05-07 MED FILL — Sodium Chloride IV Soln 0.9%: INTRAVENOUS | Qty: 2000 | Status: AC

## 2020-05-07 MED FILL — Lidocaine HCl Local Soln Prefilled Syringe 100 MG/5ML (2%): INTRAMUSCULAR | Qty: 5 | Status: AC

## 2020-05-07 MED FILL — Mannitol IV Soln 20%: INTRAVENOUS | Qty: 500 | Status: AC

## 2020-05-07 MED FILL — Sodium Bicarbonate IV Soln 8.4%: INTRAVENOUS | Qty: 50 | Status: AC

## 2020-05-07 MED FILL — Electrolyte-R (PH 7.4) Solution: INTRAVENOUS | Qty: 5000 | Status: AC

## 2020-05-07 MED FILL — Potassium Chloride Inj 2 mEq/ML: INTRAVENOUS | Qty: 40 | Status: AC

## 2020-05-07 MED FILL — Magnesium Sulfate Inj 50%: INTRAMUSCULAR | Qty: 10 | Status: AC

## 2020-05-07 MED FILL — Heparin Sodium (Porcine) Inj 1000 Unit/ML: INTRAMUSCULAR | Qty: 10 | Status: AC

## 2020-05-07 NOTE — Plan of Care (Signed)

## 2020-05-07 NOTE — Progress Notes (Signed)
Inpatient Diabetes Program Recommendations  AACE/ADA: New Consensus Statement on Inpatient Glycemic Control (2015)  Target Ranges:  Prepandial:   less than 140 mg/dL      Peak postprandial:   less than 180 mg/dL (1-2 hours)      Critically ill patients:  140 - 180 mg/dL   Lab Results  Component Value Date   GLUCAP 256 (H) 05/07/2020   HGBA1C 11.8 (H) 04/30/2020    Review of Glycemic Control Results for MARTESE, VANATTA (MRN [id number]) as of 05/07/2020 13:19  Ref. Range 05/06/2020 19:39 05/07/2020 00:39 05/07/2020 03:49 05/07/2020 06:54 05/07/2020 11:22  Glucose-Capillary Latest Ref Range: 70 - 99 mg/dL 151 (H) 761 (H) 607 (H) 269 (H) 256 (H)   Diabetes history:  DM2  Outpatient Diabetes medications:  Lantus 80 units daily Humalog 10-15 units tid  Current orders for Inpatient glycemic control:  Novolog 0-24 q4h Levemir 25 units bid  Note:  Spoke with patient at bedside.  Reviewed patient's current A1c of 11.8% (average blood sugar 292 mg/dl). Explained what a A1c is and what it measures. Also reviewed goal A1c with patient, importance of good glucose control @ home, and blood sugar goals.  He states his A1C is usually around 11%.  He is current with his PCP, Dr. [name].  He has not seen him is approximately 2 months.  Patient admits to not administering insulin regularly.  He injects in the thighs when he does take his insulins.  Asked him to try administering insulins in the abdomen as well.  Educated importance of rotating sites to avoid future scar tissue.   Encouraged patient to start taking his medications as directed and explained the long and short term complications of elevated blood sugars.  He states he checks his cbg's 3 x a day.  Reviewed The Plate Method and CHO's and foods that contain CHO's.  Encouraged him to stay between 50-70 CHO per meal.  He denies having any hypoglycemic episodes.  He understands symptoms and treatment.    He verbalizes understanding and states he is going to  start administering insulins as prescribed and eating better.  Also explained that 15-20 minute walks when medically ready can help decrease blood sugar as well.     Patient may benefit from SGLT2 at discharge.    Will continue to follow while inpatient.  Thank you, Dulce Sellar, RN, BSN Diabetes Coordinator Inpatient Diabetes Program [phone] (team pager from 8a-5p)

## 2020-05-07 NOTE — Progress Notes (Signed)
Patient ID: Ryan [name], male   DOB: [birth year], 54 y.o.   MRN: [id number] TCTS DAILY ICU PROGRESS NOTE                   [address]            [place] [address]          [phone]   2 Days Post-Op Procedure(s) (LRB): CORONARY ARTERY BYPASS GRAFTING (CABG) x Three, Using Left internal Mammary Artery and Left Leg greater saphenous vein harvested endoscopically (N/A) TRANSESOPHAGEAL ECHOCARDIOGRAM (TEE) (N/A)  Total Length of Stay:  LOS: 2 days   Subjective: Awake and alert walked in hall this am  Objective: Vital signs in last 24 hours: Temp:  [98.2 F (36.8 C)-99.2 F (37.3 C)] 98.4 F (36.9 C) (06/04 0400) Pulse Rate:  [107-118] 111 (06/04 0700) Cardiac Rhythm: Sinus tachycardia (06/04 0400) Resp:  [17-38] 24 (06/04 0700) BP: (111-146)/(61-94) 117/87 (06/04 0700) SpO2:  [91 %-97 %] 91 % (06/04 0700) Arterial Line BP: (124-159)/(66-75) 159/75 (06/03 1200) Weight:  [101 kg] 101 kg (06/04 0400)  Filed Weights   05/05/20 0638 05/06/20 0500 05/07/20 0400  Weight: 97.1 kg 103.4 kg 101 kg    Weight change: -2.42 kg   Hemodynamic parameters for last 24 hours: PAP: (29-36)/(21-23) 36/23 CO:  [5.6 L/min] 5.6 L/min CI:  [2.7 L/min/m2] 2.7 L/min/m2  Intake/Output from previous day: 06/03 0701 - 06/04 0700 In: 2698.8 [P.O.:1800; I.V.:698.9; IV Piggyback:199.9] Out: 4535 [Urine:4135; Drains:50; Chest Tube:350]  Intake/Output this shift: No intake/output data recorded.  Current Meds: Scheduled Meds: . acetaminophen  1,000 mg Oral Q6H   Or  . acetaminophen (TYLENOL) oral liquid 160 mg/5 mL  1,000 mg Per Tube Q6H  . aspirin EC  325 mg Oral Daily   Or  . aspirin  324 mg Per Tube Daily  . atorvastatin  40 mg Oral Daily  . bisacodyl  10 mg Oral Daily   Or  . bisacodyl  10 mg Rectal Daily  . Chlorhexidine Gluconate Cloth  6 each Topical Daily  . Chlorhexidine Gluconate Cloth  6 each Topical Daily  . docusate sodium  200 mg Oral Daily  . enoxaparin  (LOVENOX) injection  40 mg Subcutaneous Q24H  . gabapentin  300 mg Oral TID  . insulin aspart  0-24 Units Subcutaneous Q4H  . insulin detemir  10 Units Subcutaneous BID  . levalbuterol  0.63 mg Nebulization BID  . mouth rinse  15 mL Mouth Rinse BID  . metoprolol tartrate  12.5 mg Oral BID   Or  . metoprolol tartrate  12.5 mg Per Tube BID  . pantoprazole  40 mg Oral Daily  . pneumococcal 23 valent vaccine  0.5 mL Intramuscular Tomorrow-1000  . sodium chloride flush  10-40 mL Intracatheter Q12H  . sodium chloride flush  3 mL Intravenous Q12H   Continuous Infusions: . sodium chloride Stopped (05/05/20 2001)  . sodium chloride Stopped (05/06/20 1235)  . sodium chloride 10 mL/hr at 05/05/20 1533  . cefUROXime (ZINACEF)  IV Stopped (05/06/20 2125)  . dexmedetomidine (PRECEDEX) IV infusion Stopped (05/05/20 2114)  . DOPamine    . lactated ringers    . lactated ringers Stopped (05/06/20 0514)  . lactated ringers 20 mL/hr at 05/07/20 0700  . milrinone 0.3 mcg/kg/min (05/07/20 0700)  . nitroGLYCERIN Stopped (05/05/20 1716)  . norepinephrine (LEVOPHED) Adult infusion Stopped (05/05/20 2152)  . phenylephrine (NEO-SYNEPHRINE) Adult infusion     PRN Meds:.sodium chloride, dextrose, fentaNYL (SUBLIMAZE)  injection, lactated ringers, metoprolol tartrate, midazolam, ondansetron (ZOFRAN) IV, oxyCODONE, sodium chloride flush, sodium chloride flush, traMADol  General appearance: alert, cooperative and no distress Neurologic: intact Heart: regular rate and rhythm, S1, S2 normal, no murmur, click, rub or gallop Lungs: diminished breath sounds bibasilar Abdomen: soft, non-tender; bowel sounds normal; no masses,  no organomegaly Extremities: extremities normal, atraumatic, no cyanosis or edema and Homans sign is negative, no sign of DVT Wound: sternum stable   Lab Results: CBC: Recent Labs    05/06/20 1715 05/07/20 0356  WBC 15.8* 19.9*  HGB 13.8 13.8  HCT 40.7 41.5  PLT 112* 115*   BMET:    Recent Labs    05/06/20 1715 05/07/20 0356  NA 130* 130*  K 4.0 4.3  CL 96* 95*  CO2 24 24  GLUCOSE 282* 270*  BUN 11 12  CREATININE 1.04 1.10  CALCIUM 8.0* 8.2*    CMET: Lab Results  Component Value Date   WBC 19.9 (H) 05/07/2020   HGB 13.8 05/07/2020   HCT 41.5 05/07/2020   PLT 115 (L) 05/07/2020   GLUCOSE 270 (H) 05/07/2020   CHOL 180 11/09/2017   TRIG 299 (H) 11/09/2017   HDL 31 (L) 11/09/2017   LDLCALC 89 11/09/2017   ALT 48 (H) 04/30/2020   AST 35 04/30/2020   NA 130 (L) 05/07/2020   K 4.3 05/07/2020   CL 95 (L) 05/07/2020   CREATININE 1.10 05/07/2020   BUN 12 05/07/2020   CO2 24 05/07/2020   TSH 0.832 01/18/2013   INR 1.3 (H) 05/05/2020   HGBA1C 11.8 (H) 04/30/2020      PT/INR:  Recent Labs    05/05/20 1500  LABPROT 15.4*  INR 1.3*   Radiology: No results found.   Assessment/Plan: S/P Procedure(s) (LRB): CORONARY ARTERY BYPASS GRAFTING (CABG) x Three, Using Left internal Mammary Artery and Left Leg greater saphenous vein harvested endoscopically (N/A) TRANSESOPHAGEAL ECHOCARDIOGRAM (TEE) (N/A) Mobilize Diuresis Diabetes control d/c tubes/lines Continue foley due to diuresing patient Wbc up from yesterday  coox 54 - continue milrinone with poor lv function  Increased insulin for elevate glucose if does not improve will need insulin drip back  Grace Isaac 05/07/2020 7:25 AM

## 2020-05-07 NOTE — Progress Notes (Addendum)
ET surgery p.m. Rounds  Blood pressure 118/83, pulse (!) 106, temperature 97.6 F (36.4 C), temperature source Oral, resp. rate 20, height 5\' 9"  (1.753 m), weight 101 kg, SpO2 92 %. Stable day.  Blood sugars improved with increased Levemir Sinus tachycardia on low-dose carvedilol Continue milrinone drip, check mixed venous saturation in a.m.

## 2020-05-08 ENCOUNTER — Inpatient Hospital Stay (HOSPITAL_COMMUNITY): Payer: Medicaid Other

## 2020-05-08 LAB — BASIC METABOLIC PANEL
Anion gap: 7 (ref 5–15)
BUN: 15 mg/dL (ref 6–20)
CO2: 30 mmol/L (ref 22–32)
Calcium: 8.2 mg/dL — ABNORMAL LOW (ref 8.9–10.3)
Chloride: 95 mmol/L — ABNORMAL LOW (ref 98–111)
Creatinine, Ser: 0.95 mg/dL (ref 0.61–1.24)
GFR calc Af Amer: >60 mL/min (ref >60)
GFR calc non Af Amer: >60 mL/min (ref >60)
Glucose, Bld: 193 mg/dL — ABNORMAL HIGH (ref 70–99)
Potassium: 3.6 mmol/L (ref 3.5–5.1)
Sodium: 132 mmol/L — ABNORMAL LOW (ref 135–145)

## 2020-05-08 LAB — CBC
HCT: 35.5 % — ABNORMAL LOW (ref 39.0–52.0)
Hemoglobin: 12 g/dL — ABNORMAL LOW (ref 13.0–17.0)
MCH: 32.2 pg (ref 26.0–34.0)
MCHC: 33.8 g/dL (ref 30.0–36.0)
MCV: 95.2 fL (ref 80.0–100.0)
Platelets: 113 10*3/uL — ABNORMAL LOW (ref 150–400)
RBC: 3.73 MIL/uL — ABNORMAL LOW (ref 4.22–5.81)
RDW: 13.2 % (ref 11.5–15.5)
WBC: 13.3 10*3/uL — ABNORMAL HIGH (ref 4.0–10.5)
nRBC: 0 % (ref 0.0–0.2)

## 2020-05-08 LAB — GLUCOSE, CAPILLARY
Glucose-Capillary: 139 mg/dL — ABNORMAL HIGH (ref 70–99)
Glucose-Capillary: 194 mg/dL — ABNORMAL HIGH (ref 70–99)
Glucose-Capillary: 200 mg/dL — ABNORMAL HIGH (ref 70–99)
Glucose-Capillary: 230 mg/dL — ABNORMAL HIGH (ref 70–99)
Glucose-Capillary: 242 mg/dL — ABNORMAL HIGH (ref 70–99)
Glucose-Capillary: 284 mg/dL — ABNORMAL HIGH (ref 70–99)

## 2020-05-08 LAB — COOXEMETRY PANEL
Carboxyhemoglobin: 1.4 % (ref 0.5–1.5)
Methemoglobin: 0.8 % (ref 0.0–1.5)
O2 Saturation: 69.7 %
Total hemoglobin: 11.8 g/dL — ABNORMAL LOW (ref 12.0–16.0)

## 2020-05-08 MED ORDER — AMIODARONE HCL IN DEXTROSE 360-4.14 MG/200ML-% IV SOLN
60.0000 mg/h | INTRAVENOUS | Status: DC
  Administered 2020-05-08 (×2): 60 mg/h via INTRAVENOUS
  Filled 2020-05-08: qty 200

## 2020-05-08 MED ORDER — AMIODARONE LOAD VIA INFUSION
150.0000 mg | Freq: Once | INTRAVENOUS | Status: AC
  Administered 2020-05-08: 150 mg via INTRAVENOUS
  Filled 2020-05-08: qty 83.34

## 2020-05-08 MED ORDER — AMIODARONE HCL IN DEXTROSE 360-4.14 MG/200ML-% IV SOLN
30.0000 mg/h | INTRAVENOUS | Status: DC
  Administered 2020-05-09: 30 mg/h via INTRAVENOUS
  Filled 2020-05-08: qty 200

## 2020-05-08 MED ORDER — FUROSEMIDE 40 MG PO TABS
40.0000 mg | ORAL_TABLET | Freq: Every day | ORAL | Status: DC
  Administered 2020-05-08 – 2020-05-12 (×5): 40 mg via ORAL
  Filled 2020-05-08 (×5): qty 1

## 2020-05-08 MED ORDER — POTASSIUM CHLORIDE CRYS ER 20 MEQ PO TBCR
40.0000 meq | EXTENDED_RELEASE_TABLET | Freq: Once | ORAL | Status: AC
  Administered 2020-05-08: 40 meq via ORAL
  Filled 2020-05-08: qty 2

## 2020-05-08 MED ORDER — INSULIN ASPART 100 UNIT/ML ~~LOC~~ SOLN
0.0000 [IU] | Freq: Three times a day (TID) | SUBCUTANEOUS | Status: DC
  Administered 2020-05-08 (×2): 5 [IU] via SUBCUTANEOUS
  Administered 2020-05-09: 8 [IU] via SUBCUTANEOUS
  Administered 2020-05-09: 3 [IU] via SUBCUTANEOUS
  Administered 2020-05-09: 11 [IU] via SUBCUTANEOUS
  Administered 2020-05-10 (×2): 3 [IU] via SUBCUTANEOUS
  Administered 2020-05-10: 5 [IU] via SUBCUTANEOUS
  Administered 2020-05-11 (×2): 3 [IU] via SUBCUTANEOUS
  Administered 2020-05-12: 5 [IU] via SUBCUTANEOUS
  Administered 2020-05-12: 8 [IU] via SUBCUTANEOUS

## 2020-05-08 MED ORDER — INSULIN ASPART 100 UNIT/ML ~~LOC~~ SOLN
4.0000 [IU] | Freq: Three times a day (TID) | SUBCUTANEOUS | Status: DC
  Administered 2020-05-08 – 2020-05-11 (×8): 4 [IU] via SUBCUTANEOUS

## 2020-05-08 MED ORDER — MILRINONE LACTATE IN DEXTROSE 20-5 MG/100ML-% IV SOLN
0.1250 ug/kg/min | INTRAVENOUS | Status: DC
  Administered 2020-05-09: 0.125 ug/kg/min via INTRAVENOUS
  Filled 2020-05-08: qty 100

## 2020-05-08 MED ORDER — INSULIN DETEMIR 100 UNIT/ML ~~LOC~~ SOLN
28.0000 [IU] | Freq: Two times a day (BID) | SUBCUTANEOUS | Status: DC
  Administered 2020-05-08 – 2020-05-12 (×8): 28 [IU] via SUBCUTANEOUS
  Filled 2020-05-08 (×10): qty 0.28

## 2020-05-08 MED ORDER — INSULIN ASPART 100 UNIT/ML ~~LOC~~ SOLN
0.0000 [IU] | Freq: Every day | SUBCUTANEOUS | Status: DC
  Administered 2020-05-09 – 2020-05-10 (×2): 2 [IU] via SUBCUTANEOUS
  Administered 2020-05-11: 3 [IU] via SUBCUTANEOUS

## 2020-05-08 NOTE — Progress Notes (Signed)
  Amiodarone Drug - Drug Interaction Consult Note  Recommendations: None, monitor for now  Amiodarone is metabolized by the cytochrome P450 system and therefore has the potential to cause many drug interactions. Amiodarone has an average plasma half-life of 50 days (range 20 to 100 days).   There is potential for drug interactions to occur several weeks or months after stopping treatment and the onset of drug interactions may be slow after initiating amiodarone.   [x]  Statins: Increased risk of myopathy. Simvastatin- restrict dose to 20mg  daily. Other statins: counsel patients to report any muscle pain or weakness immediately.  []  Anticoagulants: Amiodarone can increase anticoagulant effect. Consider warfarin dose reduction. Patients should be monitored closely and the dose of anticoagulant altered accordingly, remembering that amiodarone levels take several weeks to stabilize.  []  Antiepileptics: Amiodarone can increase plasma concentration of phenytoin, the dose should be reduced. Note that small changes in phenytoin dose can result in large changes in levels. Monitor patient and counsel on signs of toxicity.  [x]  Beta blockers: increased risk of bradycardia, AV block and myocardial depression. Sotalol - avoid concomitant use.  []   Calcium channel blockers (diltiazem and verapamil): increased risk of bradycardia, AV block and myocardial depression.  []   Cyclosporine: Amiodarone increases levels of cyclosporine. Reduced dose of cyclosporine is recommended.  []  Digoxin dose should be halved when amiodarone is started.  [x]  Diuretics: increased risk of cardiotoxicity if hypokalemia occurs.  []  Oral hypoglycemic agents (glyburide, glipizide, glimepiride): increased risk of hypoglycemia. Patient's glucose levels should be monitored closely when initiating amiodarone therapy.   []  Drugs that prolong the QT interval:  Torsades de pointes risk may be increased with concurrent use - avoid if  possible.  Monitor QTc, also keep magnesium/potassium WNL if concurrent therapy can't be avoided. Antibiotics: e.g. fluoroquinolones, erythromycin. . Antiarrhythmics: e.g. quinidine, procainamide, disopyramide, sotalol. . Antipsychotics: e.g. phenothiazines, haloperidol.  . Lithium, tricyclic antidepressants, and methadone. Thank You,   05/08/2020 12:48 PM

## 2020-05-08 NOTE — Progress Notes (Signed)
3 Days Post-Op Procedure(s) (LRB): CORONARY ARTERY BYPASS GRAFTING (CABG) x Three, Using Left internal Mammary Artery and Left Leg greater saphenous vein harvested endoscopically (N/A) TRANSESOPHAGEAL ECHOCARDIOGRAM (TEE) (N/A) Subjective: Doing well after CABG for ischemic CM SSI transitioned to AC/HS with meal coverage and bid levimir nsr Objective: Vital signs in last 24 hours: Temp:  [97.6 F (36.4 C)-98.6 F (37 C)] 98.4 F (36.9 C) (06/05 0728) Pulse Rate:  [98-119] 109 (06/05 0935) Cardiac Rhythm: Sinus tachycardia (06/05 0400) Resp:  [18-34] 18 (06/05 0916) BP: (104-147)/(74-94) 112/76 (06/05 0935) SpO2:  [91 %-97 %] 96 % (06/05 0916) Weight:  [98.7 kg] 98.7 kg (06/05 0500)  Hemodynamic parameters for last 24 hours:    Intake/Output from previous day: 06/04 0701 - 06/05 0700 In: 2047.9 [P.O.:1360; I.V.:687.9] Out: 3155 [Urine:3075; Drains:50; Chest Tube:30] Intake/Output this shift: No intake/output data recorded.       Exam    General- alert and comfortable    Neck- no JVD, no cervical adenopathy palpable, no carotid bruit   Lungs- clear without rales, wheezes   Cor- regular rate and rhythm, no murmur , gallop   Abdomen- soft, non-tender   Extremities - warm, non-tender, minimal edema   Neuro- oriented, appropriate, no focal weakness   Lab Results: Recent Labs    05/07/20 0356 05/08/20 0433  WBC 19.9* 13.3*  HGB 13.8 12.0*  HCT 41.5 35.5*  PLT 115* 113*   BMET:  Recent Labs    05/07/20 0356 05/08/20 0433  NA 130* 132*  K 4.3 3.6  CL 95* 95*  CO2 24 30  GLUCOSE 270* 193*  BUN 12 15  CREATININE 1.10 0.95  CALCIUM 8.2* 8.2*    PT/INR:  Recent Labs    05/05/20 1500  LABPROT 15.4*  INR 1.3*   ABG    Component Value Date/Time   PHART 7.373 05/05/2020 2144   HCO3 23.8 05/05/2020 2144   TCO2 25 05/05/2020 2144   ACIDBASEDEF 2.0 05/05/2020 2144   O2SAT 69.7 05/08/2020 0433   CBG (last 3)  Recent Labs    05/08/20 0007 05/08/20 0433  05/08/20 0650  GLUCAP 242* 200* 139*    Assessment/Plan: S/P Procedure(s) (LRB): CORONARY ARTERY BYPASS GRAFTING (CABG) x Three, Using Left internal Mammary Artery and Left Leg greater saphenous vein harvested endoscopically (N/A) TRANSESOPHAGEAL ECHOCARDIOGRAM (TEE) (N/A) Mobilize Diuresis Diabetes control wean milrinone to .125- coox > .60   LOS: 3 days    Kathlee Nations Trigt III 05/08/2020

## 2020-05-08 NOTE — Progress Notes (Signed)
CT surgery p.m. Rounds  Patient had atrial fibrillation which converted to sinus rhythm with IV amiodarone protocol Related in hallway Pulmonary status stable Had postop BM Weaning milrinone

## 2020-05-09 ENCOUNTER — Inpatient Hospital Stay (HOSPITAL_COMMUNITY): Payer: Medicaid Other

## 2020-05-09 LAB — BASIC METABOLIC PANEL
Anion gap: 11 (ref 5–15)
BUN: 17 mg/dL (ref 6–20)
CO2: 29 mmol/L (ref 22–32)
Calcium: 8.2 mg/dL — ABNORMAL LOW (ref 8.9–10.3)
Chloride: 96 mmol/L — ABNORMAL LOW (ref 98–111)
Creatinine, Ser: 0.87 mg/dL (ref 0.61–1.24)
GFR calc Af Amer: >60 mL/min (ref >60)
GFR calc non Af Amer: >60 mL/min (ref >60)
Glucose, Bld: 164 mg/dL — ABNORMAL HIGH (ref 70–99)
Potassium: 3.5 mmol/L (ref 3.5–5.1)
Sodium: 136 mmol/L (ref 135–145)

## 2020-05-09 LAB — CBC
HCT: 36.2 % — ABNORMAL LOW (ref 39.0–52.0)
Hemoglobin: 12.2 g/dL — ABNORMAL LOW (ref 13.0–17.0)
MCH: 31.9 pg (ref 26.0–34.0)
MCHC: 33.7 g/dL (ref 30.0–36.0)
MCV: 94.8 fL (ref 80.0–100.0)
Platelets: 154 10*3/uL (ref 150–400)
RBC: 3.82 MIL/uL — ABNORMAL LOW (ref 4.22–5.81)
RDW: 13.3 % (ref 11.5–15.5)
WBC: 10.4 10*3/uL (ref 4.0–10.5)
nRBC: 0 % (ref 0.0–0.2)

## 2020-05-09 LAB — GLUCOSE, CAPILLARY
Glucose-Capillary: 171 mg/dL — ABNORMAL HIGH (ref 70–99)
Glucose-Capillary: 286 mg/dL — ABNORMAL HIGH (ref 70–99)
Glucose-Capillary: 313 mg/dL — ABNORMAL HIGH (ref 70–99)
Glucose-Capillary: 250 mg/dL — ABNORMAL HIGH (ref 70–99)

## 2020-05-09 LAB — CULTURE, RESPIRATORY W GRAM STAIN
Culture: NORMAL
Gram Stain: NONE SEEN

## 2020-05-09 LAB — MAGNESIUM: Magnesium: 2 mg/dL (ref 1.7–2.4)

## 2020-05-09 MED ORDER — POTASSIUM CHLORIDE 10 MEQ/50ML IV SOLN
10.0000 meq | INTRAVENOUS | Status: AC
  Administered 2020-05-09 (×2): 10 meq via INTRAVENOUS
  Filled 2020-05-09 (×3): qty 50

## 2020-05-09 MED ORDER — POTASSIUM CHLORIDE CRYS ER 20 MEQ PO TBCR
20.0000 meq | EXTENDED_RELEASE_TABLET | ORAL | Status: DC
  Administered 2020-05-09 (×2): 20 meq via ORAL
  Filled 2020-05-09 (×2): qty 1

## 2020-05-09 MED ORDER — AMIODARONE HCL 200 MG PO TABS
400.0000 mg | ORAL_TABLET | Freq: Two times a day (BID) | ORAL | Status: DC
  Administered 2020-05-09 – 2020-05-12 (×7): 400 mg via ORAL
  Filled 2020-05-09 (×7): qty 2

## 2020-05-09 MED ORDER — POTASSIUM CHLORIDE CRYS ER 20 MEQ PO TBCR
20.0000 meq | EXTENDED_RELEASE_TABLET | Freq: Two times a day (BID) | ORAL | Status: DC
  Administered 2020-05-10 – 2020-05-12 (×5): 20 meq via ORAL
  Filled 2020-05-09 (×5): qty 1

## 2020-05-09 NOTE — Progress Notes (Signed)
4 Days Post-Op Procedure(s) (LRB): CORONARY ARTERY BYPASS GRAFTING (CABG) x Three, Using Left internal Mammary Artery and Left Leg greater saphenous vein harvested endoscopically (N/A) TRANSESOPHAGEAL ECHOCARDIOGRAM (TEE) (N/A) Subjective: Patient had a good night maintaining sinus rhythm Walking in hall on 3 L nasal cannula Chest x-ray is satisfactory Mixed venous saturation normal this a.m., milrinone stopped Supplementing potassium with IV dosing then remove central line Transfer to progressive care for titration of meds, wean O2 and education for discharge  Objective: Vital signs in last 24 hours: Temp:  [98.2 F (36.8 C)-99.6 F (37.6 C)] 98.6 F (37 C) (06/06 0736) Pulse Rate:  [77-124] 90 (06/06 0828) Cardiac Rhythm: Normal sinus rhythm (06/06 0400) Resp:  [20-32] 24 (06/06 0828) BP: (95-153)/(68-97) 138/83 (06/06 0828) SpO2:  [90 %-98 %] 90 % (06/06 0828) FiO2 (%):  [21 %] 21 % (06/05 1938) Weight:  [99.2 kg] 99.2 kg (06/06 0500)  Hemodynamic parameters for last 24 hours:    Intake/Output from previous day: 06/05 0701 - 06/06 0700 In: 1893.2 [P.O.:840; I.V.:1053.2] Out: 2000 [Urine:2000] Intake/Output this shift: Total I/O In: 40.3 [I.V.:40.3] Out: -        Exam    General- alert and comfortable    Neck- no JVD, no cervical adenopathy palpable, no carotid bruit   Lungs- clear without rales, wheezes   Cor- regular rate and rhythm, no murmur , gallop   Abdomen- soft, non-tender   Extremities - warm, non-tender, minimal edema   Neuro- oriented, appropriate, no focal weakness   Lab Results: Recent Labs    05/08/20 0433 05/09/20 0259  WBC 13.3* 10.4  HGB 12.0* 12.2*  HCT 35.5* 36.2*  PLT 113* 154   BMET:  Recent Labs    05/08/20 0433 05/09/20 0259  NA 132* 136  K 3.6 3.5  CL 95* 96*  CO2 30 29  GLUCOSE 193* 164*  BUN 15 17  CREATININE 0.95 0.87  CALCIUM 8.2* 8.2*    PT/INR: No results for input(s): LABPROT, INR in the last 72 hours. ABG     Component Value Date/Time   PHART 7.373 05/05/2020 2144   HCO3 23.8 05/05/2020 2144   TCO2 25 05/05/2020 2144   ACIDBASEDEF 2.0 05/05/2020 2144   O2SAT 69.7 05/08/2020 0433   CBG (last 3)  Recent Labs    05/08/20 1526 05/08/20 2203 05/09/20 0652  GLUCAP 284* 194* 171*    Assessment/Plan: S/P Procedure(s) (LRB): CORONARY ARTERY BYPASS GRAFTING (CABG) x Three, Using Left internal Mammary Artery and Left Leg greater saphenous vein harvested endoscopically (N/A) TRANSESOPHAGEAL ECHOCARDIOGRAM (TEE) (N/A) Mobilize Plan for transfer to step-down: see transfer orders Wean off milrinone, continue carvedilol and Lasix  LOS: 4 days    Ryan [name] 05/09/2020

## 2020-05-09 NOTE — Progress Notes (Signed)
Mobility Specialist - Progress Note   05/09/20 1432  Mobility  Activity Ambulated in hall  Level of Assistance Independent  Assistive Device None  Distance Ambulated (ft) 510 ft  Mobility Response Tolerated well  Mobility performed by Mobility specialist  $Mobility charge 1 Mobility    Pre-mobility: 78 HR, 95% SpO2 During mobility: 83 HR, 86-95% SpO2 Post-mobility: 79 HR, 90% SpO2  Pt walked on 0.5 L/min of O2. When he was put on RA his sat dropped to 86% and went back up to 90% after being put back on 0.5 L and instructed on breathing technique.   Mamie Levers Mobility Specialist

## 2020-05-09 NOTE — Progress Notes (Signed)
Received bedside report from [facility] on Mr. Novelo, patient placed on monitor and vitals signs taken and CHG bath completed. Patient assisted to bed. Will monitor patient. Trenden Hazelrigg, Randall An RN

## 2020-05-10 ENCOUNTER — Inpatient Hospital Stay (HOSPITAL_COMMUNITY): Payer: Medicaid Other

## 2020-05-10 LAB — BASIC METABOLIC PANEL
Anion gap: 9 (ref 5–15)
BUN: 21 mg/dL — ABNORMAL HIGH (ref 6–20)
CO2: 28 mmol/L (ref 22–32)
Calcium: 8.4 mg/dL — ABNORMAL LOW (ref 8.9–10.3)
Chloride: 100 mmol/L (ref 98–111)
Creatinine, Ser: 1.04 mg/dL (ref 0.61–1.24)
GFR calc Af Amer: >60 mL/min (ref >60)
GFR calc non Af Amer: >60 mL/min (ref >60)
Glucose, Bld: 160 mg/dL — ABNORMAL HIGH (ref 70–99)
Potassium: 3.9 mmol/L (ref 3.5–5.1)
Sodium: 137 mmol/L (ref 135–145)

## 2020-05-10 LAB — CBC
HCT: 36.1 % — ABNORMAL LOW (ref 39.0–52.0)
Hemoglobin: 12.1 g/dL — ABNORMAL LOW (ref 13.0–17.0)
MCH: 31.7 pg (ref 26.0–34.0)
MCHC: 33.5 g/dL (ref 30.0–36.0)
MCV: 94.5 fL (ref 80.0–100.0)
Platelets: 173 10*3/uL (ref 150–400)
RBC: 3.82 MIL/uL — ABNORMAL LOW (ref 4.22–5.81)
RDW: 13.5 % (ref 11.5–15.5)
WBC: 10.3 10*3/uL (ref 4.0–10.5)
nRBC: 0 % (ref 0.0–0.2)

## 2020-05-10 LAB — GLUCOSE, CAPILLARY
Glucose-Capillary: 173 mg/dL — ABNORMAL HIGH (ref 70–99)
Glucose-Capillary: 175 mg/dL — ABNORMAL HIGH (ref 70–99)
Glucose-Capillary: 243 mg/dL — ABNORMAL HIGH (ref 70–99)
Glucose-Capillary: 224 mg/dL — ABNORMAL HIGH (ref 70–99)

## 2020-05-10 NOTE — Progress Notes (Signed)
CARDIAC REHAB PHASE I   Offered to walk with pt. Pt requesting to walk after EPW d/c'd. Pt demonstrating 1250 on IS. Talked about importance of smoking cessation and diabetes management. Pt with intentions not to smoke after d/c. Pt requesting walker for home use. RN and CM made aware. Will f/u later to encourage continued ambulation.  [id number] Reynold Bowen, RN BSN 05/10/2020 10:09 AM

## 2020-05-10 NOTE — Progress Notes (Signed)
Patient ambulated in hallway with rolling walker. Tynslee Bowlds, Randall An RN

## 2020-05-10 NOTE — Progress Notes (Signed)
Inpatient Diabetes Program Recommendations  AACE/ADA: New Consensus Statement on Inpatient Glycemic Control (2015)  Target Ranges:  Prepandial:   less than 140 mg/dL      Peak postprandial:   less than 180 mg/dL (1-2 hours)      Critically ill patients:  140 - 180 mg/dL   Lab Results  Component Value Date   GLUCAP 173 (H) 05/10/2020   HGBA1C 11.8 (H) 04/30/2020    Review of Glycemic Control  Results for KIMARI, COUDRIET (MRN [id number]) as of 05/10/2020 11:56  Ref. Range 05/09/2020 06:52 05/09/2020 11:25 05/09/2020 16:21 05/09/2020 21:38 05/10/2020 06:33  Glucose-Capillary Latest Ref Range: 70 - 99 mg/dL 270 (H) 350 (H) 093 (H) 250 (H) 173 (H)   Diabetes history:  DM2  Outpatient Diabetes medications:  Lantus 80 units daily Humalog 10-15 units tid  Current orders for Inpatient glycemic control:  Novolog 0-15 units tid + hs Levemir 28 units bid Novolog 4 units tid meal coverage   Glucose trends increase into the 300's after meals.  -  Consider increasing Novolog meal coverage from 4 units to 10 units tid if eating >50% of meals.  Patient may benefit from SGLT2 at discharge.    Will continue to follow while inpatient.  Thank you, Christena Deem RN, MSN, BC-ADM Inpatient Diabetes Coordinator Team Pager [phone] (8a-5p)

## 2020-05-10 NOTE — Progress Notes (Signed)
CARDIAC REHAB PHASE I   Returned to offer to walk with pt. Pt states he is walking independently in hallway. Walker already delivered to room. Pt denies further questions or concerns. Will continue to follow.  Reynold Bowen, RN BSN 05/10/2020 2:56 PM

## 2020-05-10 NOTE — Progress Notes (Addendum)
[address]       [place] [address]             [phone]       5 Days Post-Op Procedure(s) (LRB): CORONARY ARTERY BYPASS GRAFTING (CABG) x Three, Using Left internal Mammary Artery and Left Leg greater saphenous vein harvested endoscopically (N/A) TRANSESOPHAGEAL ECHOCARDIOGRAM (TEE) (N/A) Subjective: Feels good, no concerns.  Walking in the hall without difficulty. Tolerating cardiac diet.  BM yesterday.   Objective: Vital signs in last 24 hours: Temp:  [97.7 F (36.5 C)-98.6 F (37 C)] 98.2 F (36.8 C) (06/07 0305) Pulse Rate:  [74-97] 74 (06/07 0305) Cardiac Rhythm: Normal sinus rhythm (06/07 0305) Resp:  [19-24] 19 (06/07 0305) BP: (106-138)/(75-89) 124/89 (06/07 0305) SpO2:  [90 %-99 %] 93 % (06/07 0305) Weight:  [99.7 kg] 99.7 kg (06/07 0305)    Intake/Output from previous day: 06/06 0701 - 06/07 0700 In: 400.3 [P.O.:360; I.V.:40.3] Out: -  Intake/Output this shift: No intake/output data recorded.  General appearance: alert, cooperative and no distress Neurologic: intact Heart: regularly irregular rhythm and Few PVC's Lungs: Breath sounds are CTA. CXR shows small left pleural effusion and tiny (~1cm) lateral PTX.  Good sats on RA.  Abdomen: Soft, NT, active BS Extremities: No edema. Wound: The sternal incision and left LE incisions are intact and dry.  Lab Results: Recent Labs    05/09/20 0259 05/10/20 0237  WBC 10.4 10.3  HGB 12.2* 12.1*  HCT 36.2* 36.1*  PLT 154 173   BMET:  Recent Labs    05/09/20 0259 05/10/20 0237  NA 136 137  K 3.5 3.9  CL 96* 100  CO2 29 28  GLUCOSE 164* 160*  BUN 17 21*  CREATININE 0.87 1.04  CALCIUM 8.2* 8.4*    PT/INR: No results for input(s): LABPROT, INR in the last 72 hours. ABG    Component Value Date/Time   PHART 7.373 05/05/2020 2144   HCO3 23.8 05/05/2020 2144   TCO2 25 05/05/2020 2144   ACIDBASEDEF 2.0 05/05/2020 2144   O2SAT 69.7 05/08/2020 0433   CBG (last 3)  Recent  Labs    05/09/20 1621 05/09/20 2138 05/10/20 0633  GLUCAP 313* 250* 173*    EXAM: CHEST - 2 VIEW  COMPARISON:  Radiograph 05/09/2020  FINDINGS: Interval removal of a right IJ catheter sheath. Persistent bilateral effusions as well as a small left apical pneumothorax. No visible right pleural gas. Some streaky and gradient opacities in the lung bases compatible with a combination of atelectasis and layering pleural fluid. Postsurgical changes related to prior CABG including intact and aligned sternotomy wires and multiple surgical clips projecting over the mediastinum. Abandoned epicardial pacer leads noted over the upper abdomen. Telemetry leads overlie the chest. No other acute osseous or soft tissue abnormality.  IMPRESSION: 1. Persistent bilateral effusions with small left apical pneumothorax component. 2. Bibasilar atelectasis and layering pleural fluid. 3. Postsurgical changes from prior CABG. 4. Removal of a right IJ catheter.   Electronically Signed   By: Kreg Shropshire M.D.   On: 05/10/2020 06:42  Assessment/Plan: S/P Procedure(s) (LRB): CORONARY ARTERY BYPASS GRAFTING (CABG) x Three, Using Left internal Mammary Artery and Left Leg greater saphenous vein harvested endoscopically (N/A) TRANSESOPHAGEAL ECHOCARDIOGRAM (TEE) (N/A)  -POD4 CABG x 3 for MVCAD, pre-op EF 40-45%.  Making satisfactory progress. Continue ASA, carvedilol, Lipitor.  Remove pacer wires. Anticipate discharge tomorrow.   -Post-op A-fib- back in SR avter initial IV amiodarone load. Now on  PO amio with stable SR, few PVC's  -Post-op volume excess-  Wt. 2 1/2 KG above pre-op. Continue oral Lasix. K+3.9     LOS: 5 days    Malon Kindle [phone] 05/10/2020   Doing well off milrinone CXR today is satisfactory DC instructions reviewed with patient  patient examined and medical record reviewed,agree with above note. Tharon Aquas Trigt III 05/10/2020

## 2020-05-10 NOTE — Progress Notes (Signed)
Pt ambulated x 400 feet around the unit independently on room air oxygen saturations 90-91 percent on room air

## 2020-05-10 NOTE — Op Note (Signed)
Ryan [name], Ryan [name] MEDICAL RECORD MP:[id number] ACCOUNT [id number] DATE OF BIRTH:07/13/1966 FACILITY: [facility] LOCATION: [facility] PHYSICIAN:Antionne Enrique BServando Snare, MD  OPERATIVE REPORT  DATE OF PROCEDURE:  05/05/2020  PREOPERATIVE DIAGNOSIS:  Severe left ventricular dysfunction with restenosis of left anterior descending stents and progression of coronary artery disease.  POSTOPERATIVE DIAGNOSIS:  Severe left ventricular dysfunction with restenosis of left anterior descending stents and progression of coronary artery disease.  SURGICAL PROCEDURE:  Coronary artery bypass grafting x3 with the left internal mammary to the left anterior descending coronary artery, reverse saphenous vein graft to the diagonal coronary artery, reverse saphenous vein graft to the distal right  coronary artery, with left thigh endoscopic vein harvesting of the greater saphenous vein.  SURGEON:  Lanelle Bal, MD  FIRST ASSISTANT:  Nicholes Rough, PA  BRIEF HISTORY:  The patient is a 54 year old male with poorly controlled diabetes, hemoglobin A1c of 11, who for the past 10 years has been treated intermittently for significant coronary disease including initial presentation with acute anterior  myocardial infarction when stents were placed.  He has had repeat stents with restenosis.  Recent cardiac catheterization by Dr. Armida Sans because of increasing anginal pain revealed complex proximal LAD stenosis in the stent with addition of  progression of disease.  Further stenting and possible occlusion of the diagonal artery was considered and the patient was referred for coronary artery bypass grafting.  Overall, ventricular function is depressed with anterior apical hypokinesis.   Overall, ejection fraction between 30 and 35%.  Because of the patient's symptoms, progression of disease in the LAD, diagonal system, and also 60% stenosis of the right coronary artery with depressed LV function coronary artery bypass grafting was   recommended to the patient who agreed and signed informed consent.  He had been maintained on Brilinta.  This was held for 5 days preoperatively.  DESCRIPTION OF PROCEDURE:  With Swan-Ganz and arterial line monitors in place he underwent general endotracheal anesthesia without incident.  It should be noted his PA pressures were half systemic.  The chest was prepped with Betadine, draped in the  usual sterile manner.  Appropriate timeout was performed.  The patient had known both cerebrovascular and peripheral vascular disease.  He had previously had a left subclavian to carotid bypass.  The arterial pressures in his upper extremities were  within 10 mm of each side, left compared to right.  He also had previously had a right femoral popliteal bypass.  We first proceeded with, after appropriate timeout, endoscopic vein harvesting of the left greater saphenous vein in the thigh and upper  calf.  This vein was of excellent quality and caliber.  Median sternotomy was performed.  Left internal mammary artery was dissected down as a pedicle graft.  The distal artery was divided and had good free flow.  Pericardium was opened.  The patient had  significant anterior apical hypokinesis with areas of mottled scarring, but no definite aneurysm.  The patient was systemically heparinized.  Ascending aorta was cannulated.  The right atrium was cannulated.  An aortic root vent cardioplegia needle was  introduced into the ascending aorta.  The patient was placed on cardiopulmonary bypass 2.4 liters per minute per meter square.  Sites anastomosis were selected and dissected at the epicardium.  The patient's body temperature was cooled to 32 degrees.   Aortic crossclamp was applied.  500 mL cold blood potassium cardioplegia was administered.  We turned our attention first to the distal right coronary artery, which was opened and admitted a  1.5 mm probe.  Using a running 7-0 Prolene a distal anastomosis  was performed.  The  heart was then elevated and the diagonal coronary artery, which was of moderate size, admitted a 1.5 mm probe, was opened and using a running 7-0 Prolene a distal anastomosis was performed with a segment of reverse saphenous vein  graft.  We then turned our attention to the left anterior descending coronary artery.  The vessel was partially intramyocardial.  Between the distal and mid 1/3 of the vessel the LAD was opened, admitted 1.5 mm probe distally.  Using a running 8-0  Prolene, the left internal mammary artery was anastomosed to left anterior descending coronary artery.  With release of the bulldog on the mammary artery, there was rise in myocardial septal temperature.  The bulldog was placed back on the mammary  artery.  Additional cold blood cardioplegia was administered down the vein grafts and into the root.  With cross-clamp still in place, 2 punch aortotomies were performed and each of the 2 vein grafts were anastomosed to the ascending aorta.  Heart was  allowed to passively fill and deair.  The bulldog on the mammary artery was removed with rise in myocardial septal temperature.  The heart was allowed to passively fill and deair and the aortic crossclamp was removed and the proximal anastomosis  completed.  Because of continued air in the ventricle seen on TEE, a 16-gauge needle was introduced into the left ventricular apex to further deair the heart.  The patient, because of the poor LV function, had been started on milrinone and dopamine.  His  body temperature was rewarmed.  Atrial and ventricular pacing wires were applied.  He was atrially paced to increased rate.  With the body temperature rewarmed to 37 degrees he was then ventilated and weaned from cardiopulmonary bypass on dopamine and  milrinone.  He was slowly weaned as his blood pressure was initially labile.  This stabilizing separated from cardiopulmonary bypass he was decannulated.  Protamine sulfate was administered with operative  field hemostatic.  Left pleural tube and Blake  mediastinal drain were left in place.  Pericardium was loosely reapproximated.  Sternum closed with #6 stainless steel wire.  Fascia closed with interrupted 0 Vicryl, running 3-0 Vicryl for subcutaneous tissue, and 3-0 subcuticular stitch in skin edges.   Dry dressings were applied including a Prevena incisional wound VAC.  With the patient on low dose milrinone and dopamine he was then transferred to surgical intensive care unit for further postoperative care.  Sponge and needle count was reported as  correct.  He did not require any blood products during the operative procedure.  Total crossclamp time was 77 minutes.  Total pump time 121 minutes.  CN/NUANCE  D:05/10/2020 T:05/10/2020 JOB:[id number]

## 2020-05-10 NOTE — Progress Notes (Signed)
EPW pulled per protocol and as ordered. All ends intact. Slight resistance met with right atrial wire but was then removed. Slight oozing from left wire. bp 106/74 heart rate 72 on monitor. Patient reminded to lie supine approximately one hour. Will continue to monitor . Edyn Popoca, Bettina Gavia rN

## 2020-05-10 NOTE — Progress Notes (Signed)
Mobility Specialist: Progress Note    05/10/20 1409  Mobility  Activity Ambulated in hall  Level of Assistance Independent  Assistive Device Front wheel walker  Distance Ambulated (ft) 600 ft  Mobility Response Tolerated well  Mobility performed by Mobility specialist  Bed Position Chair  $Mobility charge 1 Mobility   Pt seen ambulating in hallway independently.   [facility] Wren Pryce Mobility Specialist

## 2020-05-10 NOTE — Op Note (Deleted)
  The note originally documented on this encounter has been moved the the encounter in which it belongs.  

## 2020-05-11 ENCOUNTER — Inpatient Hospital Stay (HOSPITAL_COMMUNITY): Payer: Medicaid Other

## 2020-05-11 DIAGNOSIS — I5021 Acute systolic (congestive) heart failure: Secondary | ICD-10-CM

## 2020-05-11 DIAGNOSIS — I472 Ventricular tachycardia: Secondary | ICD-10-CM

## 2020-05-11 LAB — BASIC METABOLIC PANEL
Anion gap: 10 (ref 5–15)
BUN: 22 mg/dL — ABNORMAL HIGH (ref 6–20)
CO2: 27 mmol/L (ref 22–32)
Calcium: 8.7 mg/dL — ABNORMAL LOW (ref 8.9–10.3)
Chloride: 96 mmol/L — ABNORMAL LOW (ref 98–111)
Creatinine, Ser: 1.04 mg/dL (ref 0.61–1.24)
GFR calc Af Amer: >60 mL/min (ref >60)
GFR calc non Af Amer: >60 mL/min (ref >60)
Glucose, Bld: 244 mg/dL — ABNORMAL HIGH (ref 70–99)
Potassium: 4.5 mmol/L (ref 3.5–5.1)
Sodium: 133 mmol/L — ABNORMAL LOW (ref 135–145)

## 2020-05-11 LAB — CBC
HCT: 37.9 % — ABNORMAL LOW (ref 39.0–52.0)
Hemoglobin: 12.7 g/dL — ABNORMAL LOW (ref 13.0–17.0)
MCH: 32 pg (ref 26.0–34.0)
MCHC: 33.5 g/dL (ref 30.0–36.0)
MCV: 95.5 fL (ref 80.0–100.0)
Platelets: 213 10*3/uL (ref 150–400)
RBC: 3.97 MIL/uL — ABNORMAL LOW (ref 4.22–5.81)
RDW: 13.8 % (ref 11.5–15.5)
WBC: 10 10*3/uL (ref 4.0–10.5)
nRBC: 0 % (ref 0.0–0.2)

## 2020-05-11 LAB — GLUCOSE, CAPILLARY
Glucose-Capillary: 190 mg/dL — ABNORMAL HIGH (ref 70–99)
Glucose-Capillary: 197 mg/dL — ABNORMAL HIGH (ref 70–99)
Glucose-Capillary: 108 mg/dL — ABNORMAL HIGH (ref 70–99)
Glucose-Capillary: 270 mg/dL — ABNORMAL HIGH (ref 70–99)

## 2020-05-11 LAB — LIPID PANEL
Cholesterol: 141 mg/dL (ref 0–200)
HDL: 24 mg/dL — ABNORMAL LOW (ref >40)
LDL Cholesterol: 84 mg/dL (ref 0–99)
Total CHOL/HDL Ratio: 5.9 RATIO
Triglycerides: 163 mg/dL — ABNORMAL HIGH (ref <150)
VLDL: 33 mg/dL (ref 0–40)

## 2020-05-11 LAB — ECHOCARDIOGRAM LIMITED
Height: 69 in
Weight: 3559.11 oz

## 2020-05-11 LAB — TSH: TSH: 6.023 u[IU]/mL — ABNORMAL HIGH (ref 0.350–4.500)

## 2020-05-11 LAB — MAGNESIUM: Magnesium: 1.8 mg/dL (ref 1.7–2.4)

## 2020-05-11 MED ORDER — ASPIRIN EC 81 MG PO TBEC
81.0000 mg | DELAYED_RELEASE_TABLET | Freq: Every day | ORAL | Status: DC
  Administered 2020-05-12: 81 mg via ORAL
  Filled 2020-05-11: qty 1

## 2020-05-11 MED ORDER — QUETIAPINE FUMARATE 25 MG PO TABS
50.0000 mg | ORAL_TABLET | Freq: Every day | ORAL | Status: DC
  Administered 2020-05-11: 50 mg via ORAL
  Filled 2020-05-11: qty 2

## 2020-05-11 MED ORDER — INSULIN ASPART 100 UNIT/ML ~~LOC~~ SOLN
8.0000 [IU] | Freq: Three times a day (TID) | SUBCUTANEOUS | Status: DC
  Administered 2020-05-11 – 2020-05-12 (×3): 8 [IU] via SUBCUTANEOUS

## 2020-05-11 MED ORDER — MAGNESIUM SULFATE 4 GM/100ML IV SOLN
4.0000 g | Freq: Once | INTRAVENOUS | Status: AC
  Administered 2020-05-11: 4 g via INTRAVENOUS
  Filled 2020-05-11: qty 100

## 2020-05-11 MED ORDER — CARVEDILOL 12.5 MG PO TABS
12.5000 mg | ORAL_TABLET | Freq: Two times a day (BID) | ORAL | Status: DC
  Administered 2020-05-11 – 2020-05-12 (×4): 12.5 mg via ORAL
  Filled 2020-05-11 (×4): qty 1

## 2020-05-11 MED ORDER — APIXABAN 5 MG PO TABS
5.0000 mg | ORAL_TABLET | Freq: Two times a day (BID) | ORAL | Status: DC
  Administered 2020-05-11 – 2020-05-12 (×2): 5 mg via ORAL
  Filled 2020-05-11 (×2): qty 1

## 2020-05-11 MED ORDER — PERFLUTREN LIPID MICROSPHERE
1.0000 mL | INTRAVENOUS | Status: AC | PRN
  Administered 2020-05-11: 3 mL via INTRAVENOUS
  Filled 2020-05-11: qty 10

## 2020-05-11 NOTE — Progress Notes (Signed)
Echocardiogram 2D Echocardiogram has been performed.  Ryan [name] 05/11/2020, 3:16 PM

## 2020-05-11 NOTE — Progress Notes (Signed)
Mobility Specialist: Progress Note    05/11/20 1410  Mobility  Activity Ambulated in hall  Level of Assistance Independent  Assistive Device None  Distance Ambulated (ft) 470 ft  Mobility Response Tolerated well  Mobility performed by Mobility specialist  Bed Position Chair  $Mobility charge 1 Mobility   Pt seen ambulating in hallway independently. Pt stated he feels fine while walking and will continue to walk throughout the [place].   [facility] Jakeem Grape Mobility Specialist

## 2020-05-11 NOTE — Progress Notes (Signed)
ANTICOAGULATION CONSULT NOTE  Pharmacy Consult for apixaban Indication: LV thrombus  Labs: Recent Labs    05/09/20 0259 05/09/20 0259 05/10/20 0237 05/11/20 0358  HGB 12.2*   < > 12.1* 12.7*  HCT 36.2*  --  36.1* 37.9*  PLT 154  --  173 213  CREATININE 0.87  --  1.04 1.04   < > = values in this interval not displayed.    Assessment: 59 yom presenting with NSVT, found to have LV apical thrombus on ECHO. Pharmacy consulted to dose apixaban. Cardiology discussed plan with CVTS. Aspirin dose reduced to 81mg  daily. Lovenox prophylactic dosing d/c'd (last dose 6/7 PM). Patient is not on anticoagulation PTA. CBC and SCr stable. No active bleed issues reported.  Goal of Therapy:  Monitor platelets by anticoagulation protocol: Yes   Plan:  Start apixaban 5mg  PO BID Monitor CBC, s/sx bleeding   8/7, PharmD, BCPS Clinical Pharmacist 05/11/2020 7:15 PM

## 2020-05-11 NOTE — Progress Notes (Signed)
CARDIAC REHAB PHASE I   Offered to walk with pt. Pt irritable and anxious to go home. D/c education completed with pt and family member. Pt educated on importance of site care and monitoring incision daily. Encouraged continued IS use, walks, and sternal precautions. Pt given in-the-tube sheet, along with heart healthy and diabetic diets. Reviewed restrictions and exercise guidelines. Will refer to CRP II [place].   [id number] Reynold Bowen, RN BSN 05/11/2020 9:19 AM

## 2020-05-11 NOTE — Consult Note (Addendum)
Cardiology Consultation:   Patient ID: Ryan [name] MRN: [id number]; DOB: 04-Dec-1966  Admit date: 05/05/2020 Date of Consult: 05/11/2020  Primary Care Provider: Fleet Contras, MD [facility] Cardiologist: [name], MD  [facility] Electrophysiologist:  None    Patient Profile:   Ryan [name] is a 54 y.o. male with a hx of CAD s/p recent CABG x 3, PAD (followed by [facility]), tobacco abuse, HLD, IDDM, and ischemic cardiomyopathy who is being seen today for the evaluation of NSVT at the request of Dr. Tyrone Sage.  History of Present Illness:   Mr. Mamula has a history of CAD with anterior STEMI in 2013 with cardiac arrest. He had occluded mid LAD treated with DES, CTO of third OM branch, mild disease in the RCA. EF was 35% at the time of cath, but normal on echo. LHC 10/2016 with severe in-stent restenosis in the LAD treated with cutting balloon. Hx of noncompliance with follow up and medications. Anterior STEMI 07/2017 with occluded LAD proximal to prior stent treated with DES. PCI also performed on the ostial diagonal branch. EF was 40% at that time. LHC 05/2019 for unstable angina showed severe restenosis in the mid LAD stented segment treated with cutting balloon angioplasty - stent jailed ostial diagonal branch EF was 35-45% by cath. LHC 04/2020 again showed severe restenosis of the stented LAD segment. Given his history of restenosis, he was referred to CT surgery for CABG. He is also s/p aortofemoral bypass 01/2013 and left carotid to subclavian bypass 04/2013, right femoral to AK popliteal bypass with SV and right femoral endarterectomy.   He underwent revascularization with CABG x 3 (LIMA-LAD, SVG-diagonal, SVG-RCA) on 05/05/20. Echo prior to surgery with EF 40-45% and grade 3 DD. However, pre-op TEE with EF 25-30%. Amiodarone bolus 05/08/20 followed by 400 mg BID starting on 05/09/20. Pt was noted to have NSVT overnight 6/7-6/8 (POD # 5-6) on telemetry and cardiology was consulted.   Mg  1.8 K 4.5 He received 4 g IV Mg today.  On my interview, he denies dizziness or syncope, palpitations, dyspnea, and lower extremity edema. He is recovering well from CABG and eager to discharge home.   Telemetry with 13-beat NSVT at approximately 0010 last night.    Past Medical History:  Diagnosis Date  . Acute myocardial infarction of other anterior wall, subsequent episode of care 12/12/2011  . Anal fissure   . Arthritis    back  . Atherosclerosis of native artery of both lower extremities with intermittent claudication (HCC) 04/01/2018  . Bruises easily    d/t being on Effient  . CAD (coronary artery disease)    A. Acute Ant STEMI 12/12/2011   . Cardiomyopathy secondary 12/26/2011  . Chronic total occlusion of artery of the extremities (HCC) 02/26/2012  . Claudication (HCC) 12/26/2011  . Coronary atherosclerosis of native coronary artery    a. ant STEMI with cardiac arrest 2013 s/p DES to mLAD. b. [place] 10/2016 s/p PTCA to mLAD.  . Diabetes mellitus without complication (HCC)   . Essential hypertension 10/23/2016  . GERD (gastroesophageal reflux disease)    "takes tums"  . History of blood transfusion    no abnormal reaction noted  . History of kidney stones   . Hypertension   . Ischemic cardiomyopathy    a. EF 35% in 02/2012 at time of acute MI, improved to normal on subsequent imaging.  . MI (myocardial infarction) (HCC)    AMI 1/13 - complicated by VT/Tosades  . Mixed hyperlipidemia   .  Numbness and tingling of right arm 03/31/2013  . Occlusion and stenosis of carotid artery without mention of cerebral infarction 08/26/2012  . PAD (peripheral artery disease) (HCC)    followed by Vascular - aortobifem bypass 01/2013 & left carotid-subclavian artery bypass 04/2013  . Peripheral vascular disease, unspecified (HCC) 12/16/2012  . PVD (peripheral vascular disease) (HCC) 03/31/2013  . Rectal polyp   . Subclavian steal syndrome 08/11/2013  . Uncontrolled diabetes mellitus (HCC) 10/23/2016    . Wears partial dentures    upper    Past Surgical History:  Procedure Laterality Date  . ABDOMINAL AORTOGRAM W/LOWER EXTREMITY N/A 06/26/2017   Procedure: Abdominal Aortogram w/Lower Extremity;  Surgeon: Nada Libman, MD;  [facility] IN[facility];  Service: Cardiovascular;  Laterality: N/A;  . ANAL FISSURECTOMY    . AORTA - BILATERAL FEMORAL ARTERY BYPASS GRAFT N/A 01/16/2013   Procedure: AORTA BIFEMORAL BYPASS GRAFT;  Surgeon: Nada Libman, MD;  Location: [facility] OR;  Service: Vascular;  Laterality: N/A;  . APPENDECTOMY    . CARDIAC CATHETERIZATION N/A 10/23/2016   Procedure: Left Heart Cath and Coronary Angiography;  Surgeon: Kathleene Hazel, MD;  Location: [facility] INVASIVE CV LAB;  Service: Cardiovascular;  Laterality: N/A;  . CARDIAC CATHETERIZATION N/A 10/23/2016   Procedure: Coronary Balloon Angioplasty;  Surgeon: Kathleene Hazel, MD;  Location: [facility];  Service: Cardiovascular;  Laterality: N/A;  . CAROTID-SUBCLAVIAN BYPASS GRAFT Left 04/03/2013   Procedure: BYPASS GRAFT CAROTID-SUBCLAVIAN;  Surgeon: Nada Libman, MD;  Location: [facility];  Service: Vascular;  Laterality: Left;  . CIRCUMCISION N/A 12/15/2015   Procedure: CIRCUMCISION ADULT;  Surgeon: Malen Gauze, MD;  Location: [facility];  Service: Urology;  Laterality: N/A;  . COLONOSCOPY    . CORONARY ANGIOPLASTY     stent placed Dec 12, 2011 and 2018  . CORONARY ARTERY BYPASS GRAFT N/A 05/05/2020   Procedure: CORONARY ARTERY BYPASS GRAFTING (CABG) x Three, Using Left internal Mammary Artery and Left Leg greater saphenous vein harvested endoscopically;  Surgeon: Delight Ovens, MD;  Location: [facility] OR;  Service: Open Heart Surgery;  Laterality: N/A;  . CORONARY BALLOON ANGIOPLASTY N/A 05/23/2019   Procedure: CORONARY BALLOON ANGIOPLASTY;  Surgeon: Kathleene Hazel, MD;  [facility];  Service: Cardiovascular;  Laterality: N/A;  . DIAGNOSTIC LAPAROSCOPY    . ENDARTERECTOMY FEMORAL  Right 03/13/2018   Procedure: FEMORAL ENDARTERECTOMY RIGHT;  Surgeon: Nada Libman, MD;  Location: [facility] OR;  Service: Vascular;  Laterality: Right;  . FEMORAL-POPLITEAL BYPASS GRAFT Right 03/13/2018   Procedure: FEMORAL-POPLITEAL ARTERY BYPASS WITH NON-REVERSE VEIN RIGHT;  Surgeon: Nada Libman, MD;  [facility]: [facility];  Service: Vascular;  Laterality: Right;  . GROIN DISSECTION Right 03/13/2018   Procedure: RE-DO COMMON FEMORAL ARTERY EXPOSURE;  Surgeon: Nada Libman, MD;  Location[facility] OR;  Service: Vascular;  Laterality: Right;  . I & D EXTREMITY Left 04/18/2013   Procedure: debridement of left neck lymphocele;  Surgeon: Nada Libman, MD;  Location: [facility] OR;  Service: Vascular;  Laterality: Left;  I and D of left neck  . I & D EXTREMITY Right 11/21/2014   Procedure: IRRIGATION AND DEBRIDEMENT RIGHT HAND;  Surgeon: Dominica Severin, MD;  Location: [facility] OR;  Service: Orthopedics;  Laterality: Right;  . INTRAVASCULAR PRESSURE WIRE/FFR STUDY N/A 04/22/2020   Procedure: INTRAVASCULAR PRESSURE WIRE/FFR STUDY;  Surgeon: Kathleene Hazel, MD;  [facility];  Service: Cardiovascular;  Laterality: N/A;  . LEFT HEART  CATH AND CORONARY ANGIOGRAPHY N/A 05/23/2019   Procedure: LEFT HEART CATH AND CORONARY ANGIOGRAPHY;  Surgeon: Burnell Blanks, MD;  Location: [place] CV LAB;  Service: Cardiovascular;  Laterality: N/A;  . LEFT HEART CATH AND CORONARY ANGIOGRAPHY N/A 04/22/2020   Procedure: LEFT HEART CATH AND CORONARY ANGIOGRAPHY;  Surgeon: Burnell Blanks, MD;  Location: [place] CV LAB;  Service: Cardiovascular;  Laterality: N/A;  . LEFT HEART CATHETERIZATION WITH CORONARY ANGIOGRAM N/A 12/12/2011   Procedure: LEFT HEART CATHETERIZATION WITH CORONARY ANGIOGRAM;  Surgeon: Burnell Blanks, MD;  Location: [facility] CATH LAB;  Service: Cardiovascular;  Laterality: N/A;  . LOWER EXTREMITY ANGIOGRAM N/A 01/31/2012   Procedure: LOWER EXTREMITY ANGIOGRAM;  Surgeon: Burnell Blanks, MD;  Location: [facility] CATH LAB;  Service: Cardiovascular;  Laterality: N/A;  . LOWER EXTREMITY ANGIOGRAPHY N/A 04/23/2018   Procedure: LOWER EXTREMITY ANGIOGRAPHY;  Surgeon: Serafina Mitchell, MD;  Location: [place] CV LAB;  Service: Cardiovascular;  Laterality: N/A;  . PERCUTANEOUS CORONARY STENT INTERVENTION (PCI-S) Right 12/12/2011   Procedure: PERCUTANEOUS CORONARY STENT INTERVENTION (PCI-S);  Surgeon: Burnell Blanks, MD;  Location: [facility] CATH LAB;  Service: Cardiovascular;  Laterality: Right;  . PERIPHERAL VASCULAR INTERVENTION Left 04/23/2018   Procedure: PERIPHERAL VASCULAR INTERVENTION;  Surgeon: Serafina Mitchell, MD;  Location: [place] CV LAB;  Service: Cardiovascular;  Laterality: Left;  superficial femoral  . REPAIR EXTENSOR TENDON Right 11/21/2014   Procedure: WITH REPAIR/RECONSTRUCTION OF EXTENSOR TENDONS AS NEEDED;  Surgeon: Roseanne Kaufman, MD;  Location: [place];  Service: Orthopedics;  Laterality: Right;  . TEE WITHOUT CARDIOVERSION N/A 05/05/2020   Procedure: TRANSESOPHAGEAL ECHOCARDIOGRAM (TEE);  Surgeon: Grace Isaac, MD;  Location: [place];  Service: Open Heart Surgery;  Laterality: N/A;     Home Medications:  Prior to Admission medications   Medication Sig Start Date End Date Taking? Authorizing Provider  aspirin EC 81 MG tablet Take 1 tablet (81 mg total) by mouth daily. 10/23/16  Yes Dunn, Dayna N, PA-C  BRILINTA 90 MG TABS tablet TAKE 1 TABLET BY MOUTH 2 TIMES A DAY Patient taking differently: Take 90 mg by mouth 2 (two) times daily.  11/27/19  Yes Burnell Blanks, MD  carvedilol (COREG) 6.25 MG tablet Take 2 tablets (12.5 mg total) by mouth 2 (two) times daily. Patient taking differently: Take 6.25 mg by mouth 2 (two) times daily.  05/21/19  Yes Lyda Jester M, PA-C  cyclobenzaprine (FLEXERIL) 10 MG tablet Take 1 tablet (10 mg total) by mouth 2 (two) times daily as needed for muscle spasms. 05/21/19  Yes Simmons, Brittainy M, PA-C  enalapril  (VASOTEC) 5 MG tablet TAKE ONE TABLET BY MOUTH TWICE DAILY Patient taking differently: Take 5 mg by mouth 2 (two) times daily.  12/02/14  Yes Larey Dresser, MD  gabapentin (NEURONTIN) 300 MG capsule Take 300 mg by mouth 3 (three) times daily. 03/25/20  Yes [provider]  Insulin Glargine (LANTUS SOLOSTAR) 100 UNIT/ML Solostar Pen Inject 80 Units into the skin at bedtime.    Yes [provider]  insulin lispro (HUMALOG) 100 UNIT/ML injection Inject into the skin 3 (three) times daily before meals. 10-15 units sliding scale prn   Yes [provider]  isosorbide mononitrate (IMDUR) 60 MG 24 hr tablet Take 1 tablet (60 mg total) by mouth every 12 (twelve) hours. 10/06/19  Yes Burnell Blanks, MD  oxyCODONE-acetaminophen (PERCOCET) 10-325 MG tablet Take 1 tablet by mouth every 4 (four) hours as needed for pain.  Yes [provider]  pantoprazole (PROTONIX) 40 MG tablet Take 40 mg by mouth daily.   Yes [provider]  simvastatin (ZOCOR) 80 MG tablet Take 80 mg by mouth at bedtime.   Yes [provider]  traZODone (DESYREL) 100 MG tablet Take 100 mg by mouth at bedtime as needed for sleep.   Yes [provider]  Vitamin D, Ergocalciferol, (DRISDOL) 1.25 MG (50000 UNIT) CAPS capsule Take 50,000 Units by mouth once a week. 03/25/20  Yes [provider]  nitroGLYCERIN (NITROSTAT) 0.4 MG SL tablet Place 1 tablet (0.4 mg total) under the tongue every 5 (five) minutes as needed for chest pain. 05/21/19 04/29/20  Allayne Butcher, PA-C    Inpatient Medications: Scheduled Meds: . amiodarone  400 mg Oral BID  . aspirin EC  325 mg Oral Daily   Or  . aspirin  324 mg Per Tube Daily  . atorvastatin  40 mg Oral Daily  . bisacodyl  10 mg Oral Daily   Or  . bisacodyl  10 mg Rectal Daily  . carvedilol  12.5 mg Oral BID WC  . Chlorhexidine Gluconate Cloth  6 each Topical Daily  . Chlorhexidine Gluconate Cloth  6 each Topical  Daily  . docusate sodium  200 mg Oral Daily  . enoxaparin (LOVENOX) injection  40 mg Subcutaneous Q24H  . furosemide  40 mg Oral Daily  . gabapentin  300 mg Oral TID  . insulin aspart  0-15 Units Subcutaneous TID WC  . insulin aspart  0-5 Units Subcutaneous QHS  . insulin aspart  8 Units Subcutaneous TID WC  . insulin detemir  28 Units Subcutaneous BID  . levalbuterol  0.63 mg Nebulization BID  . mouth rinse  15 mL Mouth Rinse BID  . pantoprazole  40 mg Oral Daily  . pneumococcal 23 valent vaccine  0.5 mL Intramuscular Tomorrow-1000  . potassium chloride  20 mEq Oral BID  . sodium chloride flush  10-40 mL Intracatheter Q12H  . sodium chloride flush  3 mL Intravenous Q12H   Continuous Infusions: . sodium chloride Stopped (05/05/20 2001)  . sodium chloride Stopped (05/06/20 1235)  . sodium chloride 10 mL/hr at 05/05/20 1533  . lactated ringers Stopped (05/06/20 0514)  . lactated ringers 10 mL/hr at 05/09/20 0900  . magnesium sulfate bolus IVPB     PRN Meds: sodium chloride, dextrose, metoprolol tartrate, midazolam, ondansetron (ZOFRAN) IV, oxyCODONE, sodium chloride flush, sodium chloride flush, traMADol  Allergies:    Allergies  Allergen Reactions  . Hydrocodone Hives and Nausea And Vomiting    Social History:   Social History   Socioeconomic History  . Marital status: Divorced    Spouse name: Not on file  . Number of children: Not on file  . Years of education: Not on file  . Highest education level: Not on file  Occupational History    Employer: OTHER  Tobacco Use  . Smoking status: Current Every Day Smoker    Packs/day: 0.25    Years: 25.00    Pack years: 6.25    Types: Cigarettes  . Smokeless tobacco: Never Used  . Tobacco comment: 1-2 cigs a day  Substance and Sexual Activity  . Alcohol use: Yes    Alcohol/week: 4.0 standard drinks    Types: 4 Cans of beer per week  . Drug use: No  . Sexual activity: Yes  Other Topics Concern  . Not on file  Social  History Narrative   Disabled - no  longer works   Chemical engineer Strain:   . Difficulty of Paying Living Expenses:   Food Insecurity:   . Worried About Programme researcher, broadcasting/film/video in the Last Year:   . Barista in the Last Year:   Transportation Needs:   . Freight forwarder (Medical):   Marland Kitchen Lack of Transportation (Non-Medical):   Physical Activity:   . Days of Exercise per Week:   . Minutes of Exercise per Session:   Stress:   . Feeling of Stress :   Social Connections:   . Frequency of Communication with Friends and Family:   . Frequency of Social Gatherings with Friends and Family:   . Attends Religious Services:   . Active Member of Clubs or Organizations:   . Attends Banker Meetings:   Marland Kitchen Marital Status:   Intimate Partner Violence:   . Fear of Current or Ex-Partner:   . Emotionally Abused:   Marland Kitchen Physically Abused:   . Sexually Abused:     Family History:    Family History  Problem Relation Age of Onset  . Cancer Father        Lung  . Diabetes Mother   . COPD Mother   . Cancer Maternal Uncle        Colon     ROS:  Please see the history of present illness.   All other ROS reviewed and negative.     Physical Exam/Data:   Vitals:   05/11/20 0015 05/11/20 0425 05/11/20 0738 05/11/20 1116  BP: (!) 146/86 134/82 134/84 130/81  Pulse: 74 79 81 79  Resp: 20 (!) 22 (!) 24 17  Temp: 98.4 F (36.9 C) 98.6 F (37 C) 98.2 F (36.8 C) 98.9 F (37.2 C)  TempSrc: Oral Oral Oral Oral  SpO2: 91% 92% 94% 100%  Weight:  100.9 kg    Height:        Intake/Output Summary (Last 24 hours) at 05/11/2020 1237 Last data filed at 05/11/2020 0740 Gross per 24 hour  Intake 518.5 ml  Output --  Net 518.5 ml   Last 3 Weights 05/11/2020 05/10/2020 05/09/2020  Weight (lbs) 222 lb 7.1 oz 219 lb 12.8 oz 218 lb 11.1 oz  Weight (kg) 100.9 kg 99.7 kg 99.2 kg     Body mass index is 32.85 kg/m.  General:  Well nourished, well developed,  in no acute distress HEENT: normal Neck: no JVD Vascular: No carotid bruits Cardiac:  normal S1, S2; RRR; no murmur  Lungs:  clear to auscultation bilaterally, occasional wheeze Abd: soft, nontender, no hepatomegaly  Ext: no edema Musculoskeletal:  No deformities, BUE and BLE strength normal and equal Skin: warm and dry  Neuro:  CNs 2-12 intact, no focal abnormalities noted Psych:  Normal affect   EKG:  The EKG was personally reviewed and demonstrates:  Sinus tachycardia HR 107, old anteroseptal infarct, TWI inferior and lateral leads - unchanged from prior Telemetry:  Telemetry was personally reviewed and demonstrates:  Sinus rhythm, 13-beat NSVT overnight at 0010  Relevant CV Studies:  Intra-op TEE 05/05/20: PRE-OP FINDINGS  Left Ventricle: The left ventricle has severely reduced systolic  function, with an ejection fraction of 25-30%. The cavity size was mildly  dilated. There is no increase in left ventricular wall thickness. Left  ventricular diffuse hypokinesis. Global HK  with areas of akinesis including the apex, apical anterior, and apical  septal segments. There is a false chord present  in the LV apex. No LV  thrombus was seen.   Echo 05/04/20: 1. The mid septum, apical septum, and apex segments are akinetic. Would  recommend a limited contrast echo to exclude apical thrombus as this is  not well visualized on the current study. EF 40-45%. Findings consistent  with prior LAD infarction.  2. Left ventricular ejection fraction, by estimation, is 40 to 45%. The  left ventricle has mildly decreased function. The left ventricle  demonstrates regional wall motion abnormalities (see scoring  diagram/findings for description). Left ventricular  diastolic parameters are consistent with Grade III diastolic dysfunction  (restrictive).  3. Right ventricular systolic function is normal. The right ventricular  size is normal. Tricuspid regurgitation signal is inadequate for  assessing  PA pressure.  4. The mitral valve is grossly normal. Trivial mitral valve  regurgitation. No evidence of mitral stenosis.  5. The aortic valve is tricuspid. Aortic valve regurgitation is not  visualized. Mild aortic valve sclerosis is present, with no evidence of  aortic valve stenosis.   Laboratory Data:  High Sensitivity Troponin:  No results for input(s): TROPONINIHS in the last 720 hours.   Chemistry Recent Labs  Lab 05/09/20 0259 05/10/20 0237 05/11/20 0358  NA 136 137 133*  K 3.5 3.9 4.5  CL 96* 100 96*  CO2 GLUCOSE 164* 160* 244*  BUN 17 21* 22*  CREATININE 0.87 1.04 1.04  CALCIUM 8.2* 8.4* 8.7*  GFRNONAA >60 >60 >60  GFRAA >60 >60 >60  ANIONGAP No results for input(s): PROT, ALBUMIN, AST, ALT, ALKPHOS, BILITOT in the last 168 hours. Hematology Recent Labs  Lab 05/09/20 0259 05/10/20 0237 05/11/20 0358  WBC 10.4 10.3 10.0  RBC 3.82* 3.82* 3.97*  HGB 12.2* 12.1* 12.7*  HCT 36.2* 36.1* 37.9*  MCV 94.8 94.5 95.5  MCH 31.9 31.7 32.0  MCHC 33.7 33.5 33.5  RDW 13.3 13.5 13.8  PLT 154 173 213   BNP Recent Labs  Lab 05/07/20 0758  BNP 481.7*    DDimer No results for input(s): DDIMER in the last 168 hours.   Radiology/Studies:  DG Chest 2 View  Result Date: 05/10/2020 CLINICAL DATA:  CABG 05/05/2020 EXAM: CHEST - 2 VIEW COMPARISON:  Radiograph 05/09/2020 FINDINGS: Interval removal of a right IJ catheter sheath. Persistent bilateral effusions as well as a small left apical pneumothorax. No visible right pleural gas. Some streaky and gradient opacities in the lung bases compatible with a combination of atelectasis and layering pleural fluid. Postsurgical changes related to prior CABG including intact and aligned sternotomy wires and multiple surgical clips projecting over the mediastinum. Abandoned epicardial pacer leads noted over the upper abdomen. Telemetry leads overlie the chest. No other acute osseous or soft tissue  abnormality. IMPRESSION: 1. Persistent bilateral effusions with small left apical pneumothorax component. 2. Bibasilar atelectasis and layering pleural fluid. 3. Postsurgical changes from prior CABG. 4. Removal of a right IJ catheter. Electronically Signed   By: Kreg Shropshire M.D.   On: 05/10/2020 06:42   DG Chest Port 1 View  Result Date: 05/09/2020 CLINICAL DATA:  History of CABG. EXAM: PORTABLE CHEST 1 VIEW COMPARISON:  05/08/2020 FINDINGS: RIGHT IJ central line sheath remains in place, tip overlying the superior vena cava. She is kinked within the soft tissues of the neck. Median sternotomy and CABG. Stable cardiomegaly. Stable opacity at the LEFT lung base which obscures the LEFT hemidiaphragm. Trace LEFT apical pneumothorax is stable. No new edema. IMPRESSION:  1. Stable trace LEFT apical pneumothorax. 2. Stable opacity at the LEFT lung base. Electronically Signed   By: Norva Pavlov M.D.   On: 05/09/2020 09:08   DG Chest Port 1 View  Result Date: 05/08/2020 CLINICAL DATA:  Chest tube. EXAM: PORTABLE CHEST 1 VIEW COMPARISON:  05/07/2020 FINDINGS: RIGHT IJ sheath remains in place, tip overlying the region of the superior vena cava. Status post median sternotomy and CABG. LEFT chest tube has been removed. Small LEFT apical pneumothorax identified. Small LEFT pleural effusion persists. There is dense opacity at the LEFT lung base which obscures the LEFT heart margin and LEFT hemidiaphragm, stable in appearance. RIGHT lung remains clear. IMPRESSION: 1. Small LEFT apical pneumothorax following removal of LEFT chest tube. 2. Persistent LEFT lower lobe opacity and LEFT pleural effusion. Electronically Signed   By: Norva Pavlov M.D.   On: 05/08/2020 08:39   {    Assessment and Plan:   NSVT - telemetry with 13-beat NSVT overnight - patient was sleeping, asymptomatic - amiodarone 400 mg BID - 4 g IV Mg today - given his ischemic cardiomyopathy, may need to consider ICD, possibly lifevest  - EF  was 40-45% prior to CABG, but 25-30% on intra-op TEE - would need to clarify function with a limited echo - check a TSH for completeness - will obtain repeat EKG to evaluate QTc - EF will determine next steps regarding need for lifevest and ICD placement vs observation - he has worn lifevest in the past and is in agreement if this is required again prior to discharge   CAD s/p CABG x 3 (LIMA-LAD, SVG-diagonal, SVG-RCA) - continue 325 mg ASA - continue BB, statin - no angina - per primary   Ischemic cardiomyopathy Chronic systolic and diastolic heart failure - EF on TTE prior to CABG was 40-45% - intra-op TEE 25-30% - he has had ICM since 2013 and has never required diuretics - need to clarify his current ejection fraction, as above   Hyperlipidemia - continue 40 mg lipitor - changed from zocor 80 mg - will obtain updated lipid panel   Hypertension - home medications enalapril, imdur on hold - continue coreg   Suspect he will still be able to discharge tomorrow +/- lifevest.      For questions or updates, please contact [facility] Please consult [email or web address] for contact info under    Signed, Marcelino Duster, [place]  05/11/2020 12:37 PM   Patient seen, examined. Available data reviewed. Agree with findings, assessment, and plan as outlined by Bettina Gavia, PA-C.  The patient is independently interviewed and examined.  He has been recovering well from uncomplicated CABG on May 05, 2020.  The patient's extensive CAD history is well outlined above.  Last night the patient had a 13 beat run of nonsustained VT and cardiology is consulted for further evaluation and management.  On my exam today, the patient is sitting up in a chair resting comfortably.  He is alert, oriented, in no distress.  HEENT is normal, JVP is normal, carotid upstrokes are normal with bilateral bruits, lungs are clear, heart is regular rate and rhythm with no murmur or gallop, abdomen is soft and nontender,  extremities show no evidence of edema.  Skin is warm and dry with no rash.  Telemetry is reviewed and demonstrated a 13 beat run of monomorphic VT at about midnight last night.  He otherwise has been in sinus rhythm without any frequent ectopy.  There is a discrepancy in his  LVEF from his surface echo that was done preoperatively which demonstrated an EF of 40 to 45% and his intraoperative TEE which reported an LVEF of 25 to 30%.  The patient is currently treated with a combination of amiodarone and carvedilol.  His electrolytes are being repleted appropriately.  His carvedilol was just increased this morning.  We are going to repeat a limited echocardiogram to reassess his LV function.  If his LVEF is less than or equal to 35%, I think it would be appropriate to place a LifeVest at discharge.  He actually wore a LifeVest several years ago for a period of 3 months and tolerated it well with no problems.  If LVEF less than 35%, he will also require adjustment in his medical program with consideration of restarting an ACE inhibitor and adding spironolactone.  If his LVEF is greater than 35%, I would favor medical therapy with continuation of amiodarone and carvedilol at current doses.  We will follow up with him in the morning and I would expect that he could be discharged tomorrow after his echocardiogram is completed.  He otherwise seems to be recovering very well from surgery.  Tonny Bollman, M.D. 05/11/2020 3:18 PM  ADDENDUM: Echo reviewed, LVEF is 30 to 35% and there is suggestion of LV apical thrombus.  As outlined above, will recommend a LifeVest.  Will reduce his aspirin dose to 81 mg and start him on apixaban for anticoagulation.  Discussed with Dr. Donata Clay.  Tonny Bollman 05/11/2020 6:33 PM

## 2020-05-11 NOTE — Progress Notes (Signed)
Inpatient Diabetes Program Recommendations  AACE/ADA: New Consensus Statement on Inpatient Glycemic Control (2015)  Target Ranges:  Prepandial:   less than 140 mg/dL      Peak postprandial:   less than 180 mg/dL (1-2 hours)      Critically ill patients:  140 - 180 mg/dL   Lab Results  Component Value Date   GLUCAP 190 (H) 05/11/2020   HGBA1C 11.8 (H) 04/30/2020    Review of Glycemic Control  Results for JANAI, BRANNIGAN (MRN [id number]) as of 05/11/2020 10:29  Ref. Range 05/10/2020 06:33 05/10/2020 11:45 05/10/2020 15:59 05/10/2020 20:56 05/11/2020 06:29  Glucose-Capillary Latest Ref Range: 70 - 99 mg/dL 270 (H) 623 (H) 762 (H) 224 (H) 190 (H)   Diabetes history:  DM2  Outpatient Diabetes medications:  Lantus 80 units daily Humalog 10-15 units tid  Current orders for Inpatient glycemic control:  Novolog 0-15 units tid + hs Levemir 28 units bid Novolog 4 units tid meal coverage   Glucose trends increase after meals.  -  Consider increasing Novolog meal coverage from 4 units to 6-8 units tid if eating >50% of meals.  Patient may benefit from SGLT2 at discharge.    Will continue to follow while inpatient.  Thank you, Christena Deem RN, MSN, BC-ADM Inpatient Diabetes Coordinator Team Pager [phone] (8a-5p)

## 2020-05-11 NOTE — Progress Notes (Signed)
6 Days Post-Op Procedure(s) (LRB): CORONARY ARTERY BYPASS GRAFTING (CABG) x Three, Using Left internal Mammary Artery and Left Leg greater saphenous vein harvested endoscopically (N/A) TRANSESOPHAGEAL ECHOCARDIOGRAM (TEE) (N/A) Subjective: Remains anxious to go home, stable sats on RA and independent with ambulation.  Had a 13-beat run of VT last night.   Objective: Vital signs in last 24 hours: Temp:  [98.1 F (36.7 C)-98.8 F (37.1 C)] 98.2 F (36.8 C) (06/08 0738) Pulse Rate:  [68-81] 81 (06/08 0738) Cardiac Rhythm: Normal sinus rhythm (06/08 0100) Resp:  [18-24] 24 (06/08 0738) BP: (106-146)/(74-86) 134/84 (06/08 0738) SpO2:  [90 %-99 %] 94 % (06/08 0738) Weight:  [100.9 kg] 100.9 kg (06/08 0425)    Intake/Output from previous day: 06/07 0701 - 06/08 0700 In: 518.5 [P.O.:480; I.V.:38.5] Out: -  Intake/Output this shift: Total I/O In: 240 [P.O.:240] Out: -   General appearance: alert, cooperative and no distress Neurologic: intact Heart: Currently in SR, 13-beat run of V-tach at around midnight last night. Lungs: Breath sounds are CTA. Good sats on RA.  Abdomen: Soft, NT, active BS Extremities: No edema. Wound: The sternal incision and left LE incisions are intact and dry.  Lab Results: Recent Labs    05/10/20 0237 05/11/20 0358  WBC 10.3 10.0  HGB 12.1* 12.7*  HCT 36.1* 37.9*  PLT 173 213   BMET:  Recent Labs    05/10/20 0237 05/11/20 0358  NA 137 133*  K 3.9 4.5  CL 100 96*  CO2 28 27  GLUCOSE 160* 244*  BUN 21* 22*  CREATININE 1.04 1.04  CALCIUM 8.4* 8.7*    PT/INR: No results for input(s): LABPROT, INR in the last 72 hours. ABG    Component Value Date/Time   PHART 7.373 05/05/2020 2144   HCO3 23.8 05/05/2020 2144   TCO2 25 05/05/2020 2144   ACIDBASEDEF 2.0 05/05/2020 2144   O2SAT 69.7 05/08/2020 0433   CBG (last 3)  Recent Labs    05/10/20 1559 05/10/20 2056 05/11/20 0629  GLUCAP 243* 224* 190*    Assessment/Plan: S/P  Procedure(s) (LRB): CORONARY ARTERY BYPASS GRAFTING (CABG) x Three, Using Left internal Mammary Artery and Left Leg greater saphenous vein harvested endoscopically (N/A) TRANSESOPHAGEAL ECHOCARDIOGRAM (TEE) (N/A)  -POD5 CABG x 3 for MVCAD, pre-op EF 40-45%.  Making satisfactory progress. Continue ASA, Lipitor.   -V-tach last night. Already loading with amiodarone for AF earlier this admission. K+4.5.  Check Magnesium this morning and treat if needed. Will ask Cardiology to review and make further recommendations.   -Post-op volume excess-  Wt. 2 1/2 KG above pre-op. Continue oral Lasix.    LOS: 6 days    Leary Roca , PA-C 05/11/2020

## 2020-05-12 LAB — GLUCOSE, CAPILLARY
Glucose-Capillary: 106 mg/dL — ABNORMAL HIGH (ref 70–99)
Glucose-Capillary: 238 mg/dL — ABNORMAL HIGH (ref 70–99)
Glucose-Capillary: 269 mg/dL — ABNORMAL HIGH (ref 70–99)
Glucose-Capillary: 273 mg/dL — ABNORMAL HIGH (ref 70–99)

## 2020-05-12 LAB — CBC
HCT: 37.1 % — ABNORMAL LOW (ref 39.0–52.0)
Hemoglobin: 12.1 g/dL — ABNORMAL LOW (ref 13.0–17.0)
MCH: 31.3 pg (ref 26.0–34.0)
MCHC: 32.6 g/dL (ref 30.0–36.0)
MCV: 95.9 fL (ref 80.0–100.0)
Platelets: 256 10*3/uL (ref 150–400)
RBC: 3.87 MIL/uL — ABNORMAL LOW (ref 4.22–5.81)
RDW: 14 % (ref 11.5–15.5)
WBC: 10.5 10*3/uL (ref 4.0–10.5)
nRBC: 0 % (ref 0.0–0.2)

## 2020-05-12 LAB — BASIC METABOLIC PANEL
Anion gap: 9 (ref 5–15)
BUN: 22 mg/dL — ABNORMAL HIGH (ref 6–20)
CO2: 27 mmol/L (ref 22–32)
Calcium: 8.7 mg/dL — ABNORMAL LOW (ref 8.9–10.3)
Chloride: 99 mmol/L (ref 98–111)
Creatinine, Ser: 1.14 mg/dL (ref 0.61–1.24)
GFR calc Af Amer: >60 mL/min (ref >60)
GFR calc non Af Amer: >60 mL/min (ref >60)
Glucose, Bld: 239 mg/dL — ABNORMAL HIGH (ref 70–99)
Potassium: 4.2 mmol/L (ref 3.5–5.1)
Sodium: 135 mmol/L (ref 135–145)

## 2020-05-12 LAB — MAGNESIUM: Magnesium: 2 mg/dL (ref 1.7–2.4)

## 2020-05-12 MED ORDER — FUROSEMIDE 40 MG PO TABS: 40.0000 mg | ORAL_TABLET | Freq: Every day | ORAL | 0 refills | Status: DC

## 2020-05-12 MED ORDER — AMIODARONE HCL 200 MG PO TABS: 400.0000 mg | ORAL_TABLET | Freq: Two times a day (BID) | ORAL | 1 refills | Status: DC

## 2020-05-12 MED ORDER — POTASSIUM CHLORIDE CRYS ER 20 MEQ PO TBCR: 20.0000 meq | EXTENDED_RELEASE_TABLET | Freq: Every day | ORAL | 0 refills | Status: DC

## 2020-05-12 MED ORDER — ENALAPRIL MALEATE 5 MG PO TABS: 5.0000 mg | ORAL_TABLET | Freq: Every day | ORAL | 2 refills | Status: DC

## 2020-05-12 MED ORDER — SACUBITRIL-VALSARTAN 24-26 MG PO TABS: 1.0000 | ORAL_TABLET | Freq: Two times a day (BID) | ORAL | Status: DC

## 2020-05-12 MED ORDER — CARVEDILOL 12.5 MG PO TABS: 12.5000 mg | ORAL_TABLET | Freq: Two times a day (BID) | ORAL | 3 refills | Status: DC

## 2020-05-12 MED ORDER — APIXABAN 5 MG PO TABS: 5.0000 mg | ORAL_TABLET | Freq: Two times a day (BID) | ORAL | 3 refills | Status: DC

## 2020-05-12 NOTE — Progress Notes (Addendum)
      [address]       [place] [address]             [phone]      7 Days Post-Op Procedure(s) (LRB): CORONARY ARTERY BYPASS GRAFTING (CABG) x Three, Using Left internal Mammary Artery and Left Leg greater saphenous vein harvested endoscopically (N/A) TRANSESOPHAGEAL ECHOCARDIOGRAM (TEE) (N/A)   Subjective:  No new complaints.  Up in chair, feels well.  He is hoping to go home today.  Objective: Vital signs in last 24 hours: Temp:  [97.9 F (36.6 C)-98.9 F (37.2 C)] 98.4 F (36.9 C) (06/09 0044) Pulse Rate:  [75-83] 75 (06/09 0020) Cardiac Rhythm: Normal sinus rhythm (06/08 1900) Resp:  [17-21] 20 (06/09 0445) BP: (90-154)/(70-106) 128/98 (06/09 0445) SpO2:  [92 %-100 %] 100 % (06/09 0445) Weight:  [100.5 kg] 100.5 kg (06/09 0129)  Intake/Output from previous day: 06/08 0701 - 06/09 0700 In: 340 [P.O.:240; IV Piggyback:100] Out: -   General appearance: alert, cooperative and no distress Heart: regular rate and rhythm Lungs: clear to auscultation bilaterally Abdomen: soft, non-tender; bowel sounds normal; no masses,  no organomegaly Extremities: extremities normal, atraumatic, no cyanosis or edema Wound: clean and dry  Lab Results: Recent Labs    05/11/20 0358 05/12/20 0444  WBC 10.0 10.5  HGB 12.7* 12.1*  HCT 37.9* 37.1*  PLT 213 256   BMET:  Recent Labs    05/11/20 0358 05/12/20 0444  NA 133* 135  K 4.5 4.2  CL 96* 99  CO2 27 27  GLUCOSE 244* 239*  BUN 22* 22*  CREATININE 1.04 1.14  CALCIUM 8.7* 8.7*    PT/INR: No results for input(s): LABPROT, INR in the last 72 hours. ABG    Component Value Date/Time   PHART 7.373 05/05/2020 2144   HCO3 23.8 05/05/2020 2144   TCO2 25 05/05/2020 2144   ACIDBASEDEF 2.0 05/05/2020 2144   O2SAT 69.7 05/08/2020 0433   CBG (last 3)  Recent Labs    05/11/20 2010 05/12/20 0018 05/12/20 0607  GLUCAP 270* 273* 238*    Assessment/Plan: S/P Procedure(s) (LRB): CORONARY ARTERY BYPASS  GRAFTING (CABG) x Three, Using Left internal Mammary Artery and Left Leg greater saphenous vein harvested endoscopically (N/A) TRANSESOPHAGEAL ECHOCARDIOGRAM (TEE) (N/A)  1. CV- brief episode of NSVT this morning- on Amiodarone, Coreg.. Cardiology following, to evaluate for possible life vest this morning.. BP is elevated up to 150s at times, will restart home Vasotec 2. Pulm- no acute issues, continue IS 3. Renal- creatinine stable, weight trending down, continue Lasix, potassium 4. TSH- slightly elevated at 6.. patient will need to follow up with PCP for recheck.. if remains elevated will likely need to start Synthroid 5. Dispo- patient stable, brief NSVT this morning, will await Cardiology recommendations for possible Lifevest.. will likely d/c today   Of Note- patient just received 120 tablets of Percocet 10/325 on 6/1.... he will not be given an RX for pain medication at discharge   LOS: 7 days   Lowella Dandy, PA-C  05/12/2020

## 2020-05-12 NOTE — Progress Notes (Signed)
Spoke with Lifevest rep Alvino Chapel and Dennis Bast this AM to notify of need for Lifevest. Awaiting Dr. Earmon Phoenix signature on order form then I will fax. Shaniquia Brafford PA-C

## 2020-05-12 NOTE — Progress Notes (Addendum)
Progress Note  Patient Name: Ryan [name] Date of Encounter: 05/12/2020  [facility] Cardiologist: [name], MD   Subjective   Feeling well, eager to go home.  No chest pain, heart palpitations, or shortness of breath overnight.  Inpatient Medications    Scheduled Meds: . amiodarone  400 mg Oral BID  . apixaban  5 mg Oral BID  . aspirin EC  81 mg Oral Daily  . atorvastatin  40 mg Oral Daily  . bisacodyl  10 mg Oral Daily   Or  . bisacodyl  10 mg Rectal Daily  . carvedilol  12.5 mg Oral BID WC  . Chlorhexidine Gluconate Cloth  6 each Topical Daily  . Chlorhexidine Gluconate Cloth  6 each Topical Daily  . docusate sodium  200 mg Oral Daily  . furosemide  40 mg Oral Daily  . gabapentin  300 mg Oral TID  . insulin aspart  0-15 Units Subcutaneous TID WC  . insulin aspart  0-5 Units Subcutaneous QHS  . insulin aspart  8 Units Subcutaneous TID WC  . insulin detemir  28 Units Subcutaneous BID  . levalbuterol  0.63 mg Nebulization BID  . mouth rinse  15 mL Mouth Rinse BID  . pantoprazole  40 mg Oral Daily  . potassium chloride  20 mEq Oral BID  . QUEtiapine  50 mg Oral QHS  . sodium chloride flush  10-40 mL Intracatheter Q12H  . sodium chloride flush  3 mL Intravenous Q12H   Continuous Infusions: . sodium chloride Stopped (05/05/20 2001)  . sodium chloride Stopped (05/06/20 1235)  . sodium chloride 10 mL/hr at 05/05/20 1533  . lactated ringers Stopped (05/06/20 0514)  . lactated ringers 10 mL/hr at 05/09/20 0900   PRN Meds: sodium chloride, dextrose, metoprolol tartrate, midazolam, ondansetron (ZOFRAN) IV, oxyCODONE, sodium chloride flush, sodium chloride flush, traMADol   Vital Signs    Vitals:   05/12/20 0044 05/12/20 0129 05/12/20 0445 05/12/20 0757  BP:   (!) 128/98 125/78  Pulse:    77  Resp:  (!) 21 20 15   Temp: 98.4 F (36.9 C)   97.8 F (36.6 C)  TempSrc:   Oral Oral  SpO2:   100% 96%  Weight:  100.5 kg    Height:        Intake/Output  Summary (Last 24 hours) at 05/12/2020 0949 Last data filed at 05/12/2020 0600 Gross per 24 hour  Intake 100 ml  Output --  Net 100 ml   Last 3 Weights 05/12/2020 05/11/2020 05/10/2020  Weight (lbs) 221 lb 8 oz 222 lb 7.1 oz 219 lb 12.8 oz  Weight (kg) 100.472 kg 100.9 kg 99.7 kg      Telemetry    Sinus rhythm, 1 3 beat run of VT noted otherwise no significant ectopy - Personally Reviewed   Physical Exam  Alert, oriented, no distress, sitting up in a chair comfortably. GEN: No acute distress.   Neck: No JVD Cardiac: RRR, no murmurs, rubs, or gallops.  Respiratory: Clear to auscultation bilaterally. GI: Soft, nontender, non-distended  MS: No edema; No deformity. Neuro:  Nonfocal  Psych: Normal affect   Labs    High Sensitivity Troponin:  No results for input(s): TROPONINIHS in the last 720 hours.    Chemistry Recent Labs  Lab 05/10/20 0237 05/11/20 0358 05/12/20 0444  NA 137 133* 135  K 3.9 4.5 4.2  CL 100 96* 99  CO2 28 27 27   GLUCOSE 160* 244* 239*  BUN 21* 22*  22*  CREATININE 1.04 1.04 1.14  CALCIUM 8.4* 8.7* 8.7*  GFRNONAA >60 >60 >60  GFRAA >60 >60 >60  ANIONGAP 9 10 9      Hematology Recent Labs  Lab 05/10/20 0237 05/11/20 0358 05/12/20 0444  WBC 10.3 10.0 10.5  RBC 3.82* 3.97* 3.87*  HGB 12.1* 12.7* 12.1*  HCT 36.1* 37.9* 37.1*  MCV 94.5 95.5 95.9  MCH 31.7 32.0 31.3  MCHC 33.5 33.5 32.6  RDW 13.5 13.8 14.0  PLT 173 213 256    BNP Recent Labs  Lab 05/07/20 0758  BNP 481.7*     DDimer No results for input(s): DDIMER in the last 168 hours.   Radiology    ECHOCARDIOGRAM LIMITED  Result Date: 05/11/2020    ECHOCARDIOGRAM LIMITED REPORT   Patient Name:   Ryan [name] Date of Exam: 05/11/2020 Medical Rec #:  [id number]    Height:       69.0 in Accession #:    [id number]   Weight:       222.4 lb Date of Birth:  04/17/1966   BSA:          2.162 m Patient Age:    54 years     BP:           130/81 mmHg Patient Gender: M            HR:           75 bpm.  Exam Location:  Inpatient Procedure: Limited Echo, Color Doppler, Cardiac Doppler and Intracardiac            Opacification Agent Indications:    L97.67 Acute systolic (congestive) heart failure  History:        Patient has prior history of Echocardiogram examinations, most                 recent 05/06/2020. Prior CABG; Risk Factors:Hypertension, Diabetes                 and Dyslipidemia. CABG x3 on 05/06/20.  Sonographer:    Raquel Sarna Senior RDCS Referring Phys: [id number] [place]  1. Left ventricular ejection fraction, by estimation, is 30 to 35%. The left ventricle has moderately decreased function. The left ventricle demonstrates regional wall motion abnormalities (see scoring diagram/findings for description).  2. Right ventricular systolic function is mildly reduced. The right ventricular size is normal.  3. The mitral valve is normal in structure. No evidence of mitral valve regurgitation.  4. The aortic valve was not well visualized. Aortic valve regurgitation is trivial.  5. The inferior vena cava is normal in size with greater than 50% respiratory variability, suggesting right atrial pressure of 3 mmHg.  6. Filling defect at LV apex on contrast images (best seen on image 38) concerning for small apical thrombus FINDINGS  Left Ventricle: Filling defect at LV apex on contrast images (best seen on image 38) concerning for small apical thrombus. Left ventricular ejection fraction, by estimation, is 30 to 35%. The left ventricle has moderately decreased function. The left ventricle demonstrates regional wall motion abnormalities.  LV Wall Scoring: The apical septal segment, apical anterior segment, apical inferior segment, and apex are akinetic. The entire lateral wall, mid anteroseptal segment, and mid anterior segment are hypokinetic. The inferior wall, basal anteroseptal segment, mid inferoseptal segment, basal anterior segment, and basal inferoseptal segment are normal. Right Ventricle: The  right ventricular size is normal. Right ventricular systolic function is mildly reduced. The tricuspid regurgitant velocity is 1.53 m/s,  and with an assumed right atrial pressure of 3 mmHg, the estimated right ventricular systolic pressure is 12.4 mmHg. Pericardium: There is no evidence of pericardial effusion. Mitral Valve: The mitral valve is normal in structure. Tricuspid Valve: The tricuspid valve is normal in structure. Tricuspid valve regurgitation is trivial. Aortic Valve: The aortic valve was not well visualized. Aortic valve regurgitation is trivial. Pulmonic Valve: The pulmonic valve was not well visualized. Pulmonic valve regurgitation is not visualized. Venous: The inferior vena cava is normal in size with greater than 50% respiratory variability, suggesting right atrial pressure of 3 mmHg.  TRICUSPID VALVE TR Peak grad:   9.4 mmHg TR Vmax:        153.00 cm/s Epifanio Lesches MD Electronically signed by Epifanio Lesches MD Signature Date/Time: 05/11/2020/5:47:04 PM    Final     Cardiac Studies   2D echocardiogram 05/11/2020: IMPRESSIONS    1. Left ventricular ejection fraction, by estimation, is 30 to 35%. The  left ventricle has moderately decreased function. The left ventricle  demonstrates regional wall motion abnormalities (see scoring  diagram/findings for description).  2. Right ventricular systolic function is mildly reduced. The right  ventricular size is normal.  3. The mitral valve is normal in structure. No evidence of mitral valve  regurgitation.  4. The aortic valve was not well visualized. Aortic valve regurgitation  is trivial.  5. The inferior vena cava is normal in size with greater than 50%  respiratory variability, suggesting right atrial pressure of 3 mmHg.  6. Filling defect at LV apex on contrast images (best seen on image 38)  concerning for small apical thrombus  Patient Profile     54 y.o. male with underlying ischemic cardiomyopathy who  presented with angina and was found to have severe recurrent in-stent restenosis in the LAD/diagonal as well as chronic occlusion of an OM branch, ultimately treated with CABG  Assessment & Plan    1.  Nonsustained VT: Asymptomatic, longest documented run is 13 beats.  He continues on amiodarone and carvedilol.  Repeat echocardiogram yesterday demonstrates LVEF 30 to 35%.  Recommend LifeVest placement and reassessment of LV function in 3 months.  Discussed with patient.  He understands that depending on follow-up echo studies, he may ultimately be recommended to undergo ICD implantation. 2.  CAD status post CABG: Progressing well, likely discharge today after LifeVest placement. 3.  Ischemic cardiomyopathy: The patient is on enalapril and carvedilol.  Once he is followed as an outpatient, I would recommend changing him to [facility] after enalapril washout.  Consider addition of spironolactone at low-dose as well.   4.  LV apical thrombus: Patient with history of anterior MI.  Yesterday reduced aspirin dose from 325 down to 81 mg.  Apixaban added.  Repeat echo 3 months.  [facility] will sign off.   Medication Recommendations: Add Entresto as above, otherwise continue current medical program Other recommendations (labs, testing, etc): None Follow up as an outpatient: Will arrange outpatient cardiology follow-up  For questions or updates, please contact [facility] Please consult [email or web address] for contact info under        Signed, Tonny Bollman, MD  05/12/2020, 9:49 AM

## 2020-05-12 NOTE — TOC Progression Note (Addendum)
Transition of Care [facility]) - Progression Note    Patient Details  Name: Ryan [name] MRN: [id number] Date of Birth: [birth year]  Transition of Care [facility]) CM/SW Contact  Graves-Bigelow, Lamar Laundry, RN Phone Number: 05/12/2020, 8:50 AM  Clinical Narrative:  Case Manager received a consult for medication cost for Apixaban. Patient has Medicaid and cost will be no more than $3.00. Patient uses [facility]. Patient has transportatoin to and from appointments. Case Manager received a call from [employer] representative and patient needs life vest at discharge- per patient he has had before. Case Manager faxed in information to the company for insurance approval. Once approved the life vest will be delivered and patient to be fit. Case Manager will continue to follow for additional transition of care needs.     Expected Discharge Plan: Home/Self Care Barriers to Discharge: Barriers Resolved  Expected Discharge Plan and Services Expected Discharge Plan: Home/Self Care In-house Referral: NA Discharge Planning Services: CM Consult   Living arrangements for the past 2 months: Mobile Home                 DME Arranged: Walker rolling DME Agency: [employer] Date DME Agency Contacted: 05/10/20 Time DME Agency Contacted: 1200 Representative spoke with at DME Agency: [facility] Arranged: NA   Readmission Risk Interventions Readmission Risk Prevention Plan 05/12/2020  Transportation Screening Complete  HRI or Home Care Consult Complete  Social Work Consult for Recovery Care Planning/Counseling Complete  Palliative Care Screening Not Applicable  Medication Review Oceanographer) Complete  Some recent data might be hidden

## 2020-05-12 NOTE — Progress Notes (Signed)
Mobility Specialist: Progress Note   05/12/20 1534  Mobility  Activity Ambulated in hall  Level of Assistance Independent  Assistive Device None  Distance Ambulated (ft) 710 ft  Mobility Response Tolerated well  Mobility performed by Mobility specialist  $Mobility charge 1 Mobility   Pt seen ambulating in hallway. Pt had no c/o.   [facility] Wilbert Schouten Mobility Specialist

## 2020-05-12 NOTE — TOC Progression Note (Signed)
Transition of Care [facility]) - Progression Note    Patient Details  Name: Ryan [name] MRN: [id number] Date of Birth: [birth year]  Transition of Care [facility]) CM/SW Contact  Graves-Bigelow, Lamar Laundry, RN Phone Number: 05/12/2020, 2:29 PM  Clinical Narrative:  Insurance has approved the Technical sales engineer. Life vest to be delivered for transition home today by 5:00 pm and the patient will be fit this evening @ time of delivery. Staff RN and patient made aware. No further needs from Case Manager at this time.   Expected Discharge Plan: Home/Self Care Barriers to Discharge: Barriers Resolved  Expected Discharge Plan and Services Expected Discharge Plan: Home/Self Care In-house Referral: NA Discharge Planning Services: CM Consult   Living arrangements for the past 2 months: Mobile Home Expected Discharge Date: 05/12/20               DME Arranged: Dan Humphreys rolling DME Agency: [employer] Date DME Agency Contacted: 05/10/20 Time DME Agency Contacted: 1200 Representative spoke with at DME Agency: [facility] Arranged: NA    Readmission Risk Interventions Readmission Risk Prevention Plan 05/12/2020  Transportation Screening Complete  HRI or Home Care Consult Complete  Social Work Consult for Recovery Care Planning/Counseling Complete  Palliative Care Screening Not Applicable  Medication Review Oceanographer) Complete  Some recent data might be hidden

## 2020-05-12 NOTE — Progress Notes (Signed)
CARDIAC REHAB PHASE I   Offered to walk with pt, pt currently eating breakfast. Pt in better spirits today. States he may go home with a LifeVest, has worn one in past. Encouraged daily weights, ambulation and IS use. Hopeful for d/c today.   [id number] Reynold Bowen, RN BSN 05/12/2020 8:21 AM

## 2020-05-12 NOTE — Progress Notes (Addendum)
Inpatient Diabetes Program Recommendations  AACE/ADA: New Consensus Statement on Inpatient Glycemic Control (2015)  Target Ranges:  Prepandial:   less than 140 mg/dL      Peak postprandial:   less than 180 mg/dL (1-2 hours)      Critically ill patients:  140 - 180 mg/dL   Lab Results  Component Value Date   GLUCAP 269 (H) 05/12/2020   HGBA1C 11.8 (H) 04/30/2020    Review of Glycemic Control  Results for ROGAN, WIGLEY (MRN [id number]) as of 05/12/2020 12:20  Ref. Range 05/11/2020 06:29 05/11/2020 11:14 05/11/2020 16:34 05/11/2020 20:10 05/12/2020 00:18 05/12/2020 06:07 05/12/2020 11:51  Glucose-Capillary Latest Ref Range: 70 - 99 mg/dL 761 (H) 607 (H) 371 (H) 270 (H) 273 (H) 238 (H) 269 (H)    Diabetes history:  DM2  Outpatient Diabetes medications:  Lantus 80 units daily Humalog 10-15 units tid  Current orders for Inpatient glycemic control:  Novolog 0-15 units tid + hs Levemir 28 units bid Novolog 8 units tid meal coverage  Inpatient Diabetes Program Recommendations:     Novolog 10 units tid with meals if eats at least 50% of meals and CBG > 80 mg/dl  Will continue to follow while inpatient.  Thank you, Dulce Sellar, RN, BSN Diabetes Coordinator Inpatient Diabetes Program [phone] (team pager from 8a-5p)

## 2020-05-12 NOTE — Discharge Instructions (Signed)
Discharge Instructions:  1. You may shower, please wash incisions daily with soap and water and keep dry.  If you wish to cover wounds with dressing you may do so but please keep clean and change daily.  No tub baths or swimming until incisions have completely healed.  If your incisions become red or develop any drainage please call our office at [phone]  2. No Driving until cleared by our office and you are no longer using narcotic pain medications  3. Monitor your weight daily.. Please use the same scale and weigh at same time... If you gain 3-5 lbs in 48 hours with associated lower extremity swelling, please contact our office at [phone]  4. Fever of 101.5 for at least 24 hours with no source, please contact our office at [phone]  5. Activity- up as tolerated, please walk at least 3 times per day.  Avoid strenuous activity, no lifting, pushing, or pulling with your arms over 8-10 lbs for a minimum of 6 weeks  6. If any questions or concerns arise, please do not hesitate to contact our office at [phone]  ------------------------------------------------- Information on my medicine - ELIQUIS (apixaban)   Why was Eliquis prescribed for you? Eliquis was prescribed for you to reduce the risk of a stroke from a clot in the heart.  What do You need to know about Eliquis ? Take your Eliquis TWICE DAILY - one tablet in the morning and one tablet in the evening with or without food. If you have difficulty swallowing the tablet whole please discuss with your pharmacist how to take the medication safely.  Take Eliquis exactly as prescribed by your doctor and DO NOT stop taking Eliquis without talking to the doctor who prescribed the medication.  Stopping may increase your risk of developing a stroke.  Refill your prescription before you run out.  After discharge, you should have regular check-up appointments with your healthcare provider that is prescribing your Eliquis.   In the future your dose may need to be changed if your kidney function or weight changes by a significant amount or as you get older.  What do you do if you miss a dose? If you miss a dose, take it as soon as you remember on the same day and resume taking twice daily.  Do not take more than one dose of ELIQUIS at the same time to make up a missed dose.  Important Safety Information A possible side effect of Eliquis is bleeding. You should call your healthcare provider right away if you experience any of the following: ? Bleeding from an injury or your nose that does not stop. ? Unusual colored urine (red or dark brown) or unusual colored stools (red or black). ? Unusual bruising for unknown reasons. ? A serious fall or if you hit your head (even if there is no bleeding).  Some medicines may interact with Eliquis and might increase your risk of bleeding or clotting while on Eliquis. To help avoid this, consult your healthcare provider or pharmacist prior to using any new prescription or non-prescription medications, including herbals, vitamins, non-steroidal anti-inflammatory drugs (NSAIDs) and supplements.  This website has more information on Eliquis (apixaban): http://www.eliquis.com/eliquis/home

## 2020-05-13 ENCOUNTER — Telehealth (HOSPITAL_COMMUNITY): Payer: Self-pay

## 2020-05-13 NOTE — Telephone Encounter (Signed)
Faxed referral to [facility] for Cardiac Rehab. 

## 2020-05-17 ENCOUNTER — Telehealth: Payer: Self-pay

## 2020-05-17 ENCOUNTER — Other Ambulatory Visit: Payer: Self-pay | Admitting: Physician Assistant

## 2020-05-17 NOTE — Telephone Encounter (Signed)
Received phone call from pt's friend to inquire about suggestions for pt's c/o constipation. She states pt has not had a BM in nearly three days. He is s/p CABG x 3 by Dr. Tyrone Sage on 05/05/20. She states pt is already using a stool softener. Suggested MiraLAX-use as directed, and if this does not relieve constipation within the next day, pt can try Dulcolax suppository. To contact [facility] if pt is still constipated after these measures. Friend also reports that the lower part of pt's sternal incision appears "a little red." Denies any other s/s of infection. She states pt is to see Dr. Alben Spittle tomorrow and he plans to have him evaluate the incision. Instructed to have Dr. Alben Spittle look at it and to f/u w/ [facility] if there is any concern of infection. She verbalizes understanding.

## 2020-05-18 ENCOUNTER — Encounter: Payer: Self-pay | Admitting: Gastroenterology

## 2020-05-24 NOTE — Progress Notes (Signed)
Cardiology Office Note:    Date:  05/25/2020   ID:  Ryan [name], DOB July 11, 1966, MRN [id number]  PCP:  Fleet Contras, MD  Cardiologist:  [name], MD   Electrophysiologist:  None   Referring MD: Fleet Contras, MD   Chief Complaint:  Hospitalization Follow-up (s/p CABG)    Patient Profile:    Ryan [name] is a 54 y.o. male with:   Coronary artery disease  S/p anterior STEMI in 1/13 c/b cardiac arrest >> DES to LAD; OM1 w 100 CTO  Cath 11/17: LAD severe ISR >> cutting balloon POBA  S/p anterior STEMI 8/18 [facility]) >> PCI: DES to LAD prox to old stent, POBA to ost Dx  Cath 05/2019: mid LAD severe ISR >> PCI:  Cutting balloon POBA  Cath 05/2020: severe LAD restenosis >> s/p CABG  Peripheral Arterial Disease (Dr. Myra Gianotti)   Distal aorta occlusion and L subclavian artery s/p aorto-bifem bypass in 2014  S/p L carotid to subclavian bypass in 5/14  R fem to AK popliteal bypass and R fem endarterectomy   Hyperlipidemia   Systolic CHF; Ischemic CM  Post MI EF 35; FU echocardiogram with normal EF    EF 8/18: 40  EF 05/2019: 35-45  Echocardiogram 05/2020: EF 30-35  LV apical thrombus  Noted on echocardiogram post CABG 05/2020 >> Apixaban  Post op AFib  Amiodarone   Diabetes mellitus, insulin dependent   Tobacco abuse   Prior CV studies: Echocardiogram 05/11/2020 EF 30-35, apical, apical ant, apical inf AK, lat and ant-sept and ant HK, mildly reduced RVSF, trivial AI, small LV apical thrombus  Echocardiogram 05/04/2020 EF 40-45, prior LAD infarct, Gr 3 DD, trivial MR  Carotid US 05/04/2020 Bilat ICA 1-39  Cardiac catheterization 04/22/2020 LAD prox 80 ISR; D1 80 OM3 100 CTO RCA mid 60 EF 25-35  History of Present Illness:    Ryan [name] was last seen in clinic by Dr. [name] in 04/2020 with progressive angina (arm pain is his anginal equivalent).  He was set up for cardiac catheterization which demonstrated recurrent severe restenosis in the mid LAD  stented segement, severe stenosis of the Dx branch jailed by the stent, chronic occlusion of the OM3 and mod mid RCA stenosis.  He was referred for CABG.    He was admitted 6/2-6/9 and underwent CABG with Dr. Tyrone Sage (L-LAD, S-Dx, [facility]).  His post op course was complicated by atrial fibrillation and he was placed on Amiodarone.  He also had NSVT and echocardiogram demonstrated EF 30-35 with LV apical thrombus.  He was placed on Apixaban and DC on a LifeVest.     He returns for follow-up.  He is here alone.  Since discharge from the hospital, he has not had further arm pain which is his anginal equivalent.  His chest remains sore but is improved.  He has not had significant shortness of breath, orthopnea.  His leg swelling remains but he feels it is improved.  He notes that his weights at home have been stable.  He has not had syncope or near syncope.  He is not smoking.  His appetite has been good.  He has been referred to cardiac rehabilitation in [facility].     Past Medical History:  Diagnosis Date  . Acute myocardial infarction of other anterior wall, subsequent episode of care 12/12/2011  . Anal fissure   . Arthritis    back  . Atherosclerosis of native artery of both lower extremities with intermittent claudication (HCC) 04/01/2018  . Bruises  easily    d/t being on Effient  . CAD (coronary artery disease)    A. Acute Ant STEMI 12/12/2011   . Cardiomyopathy secondary 12/26/2011  . Chronic total occlusion of artery of the extremities (HCC) 02/26/2012  . Claudication (HCC) 12/26/2011  . Coronary atherosclerosis of native coronary artery    a. ant STEMI with cardiac arrest 2013 s/p DES to mLAD. b. [place] 10/2016 s/p PTCA to mLAD.  . Diabetes mellitus without complication (HCC)   . Essential hypertension 10/23/2016  . GERD (gastroesophageal reflux disease)    "takes tums"  . History of blood transfusion    no abnormal reaction noted  . History of kidney stones   . Hypertension   . Ischemic  cardiomyopathy    a. EF 35% in 02/2012 at time of acute MI, improved to normal on subsequent imaging.  . MI (myocardial infarction) (HCC)    AMI 1/13 - complicated by VT/Tosades  . Mixed hyperlipidemia   . Numbness and tingling of right arm 03/31/2013  . Occlusion and stenosis of carotid artery without mention of cerebral infarction 08/26/2012  . PAD (peripheral artery disease) (HCC)    followed by Vascular - aortobifem bypass 01/2013 & left carotid-subclavian artery bypass 04/2013  . Peripheral vascular disease, unspecified (HCC) 12/16/2012  . PVD (peripheral vascular disease) (HCC) 03/31/2013  . Rectal polyp   . Subclavian steal syndrome 08/11/2013  . Uncontrolled diabetes mellitus (HCC) 10/23/2016  . Wears partial dentures    upper    Current Medications: Current Meds  Medication Sig  . amiodarone (PACERONE) 200 MG tablet Take 2 tablets (400 mg total) by mouth 2 (two) times daily. For 5 days, then decrease to 200 mg BID x 7 days, then decrease to 200 mg daily  . apixaban (ELIQUIS) 5 MG TABS tablet Take 1 tablet (5 mg total) by mouth 2 (two) times daily.  Marland Kitchen aspirin EC 81 MG tablet Take 1 tablet (81 mg total) by mouth daily.  . carvedilol (COREG) 12.5 MG tablet Take 1 tablet (12.5 mg total) by mouth 2 (two) times daily with a meal.  . cyclobenzaprine (FLEXERIL) 10 MG tablet Take 1 tablet (10 mg total) by mouth 2 (two) times daily as needed for muscle spasms.  . enalapril (VASOTEC) 5 MG tablet Take 1 tablet (5 mg total) by mouth daily.  . furosemide (LASIX) 40 MG tablet Take 1.5 tablets (60 mg total) by mouth daily.  Marland Kitchen gabapentin (NEURONTIN) 300 MG capsule Take 300 mg by mouth 3 (three) times daily.  . Insulin Glargine (LANTUS SOLOSTAR) 100 UNIT/ML Solostar Pen Inject 80 Units into the skin at bedtime.   . insulin lispro (HUMALOG) 100 UNIT/ML injection Inject into the skin 3 (three) times daily before meals. 10-15 units sliding scale prn  . nitroGLYCERIN (NITROSTAT) 0.4 MG SL tablet Place 1  tablet (0.4 mg total) under the tongue every 5 (five) minutes as needed for chest pain.  Marland Kitchen oxyCODONE-acetaminophen (PERCOCET) 10-325 MG tablet Take 1 tablet by mouth every 4 (four) hours as needed for pain.  . pantoprazole (PROTONIX) 40 MG tablet Take 40 mg by mouth daily.  . potassium chloride SA (KLOR-CON) 20 MEQ tablet Take 1 tablet (20 mEq total) by mouth daily.  . traZODone (DESYREL) 100 MG tablet Take 100 mg by mouth at bedtime as needed for sleep.  . Vitamin D, Ergocalciferol, (DRISDOL) 1.25 MG (50000 UNIT) CAPS capsule Take 50,000 Units by mouth once a week.  . [DISCONTINUED] furosemide (LASIX) 40 MG tablet TAKE 1  TABLET BY MOUTH EVERY DAY  . [DISCONTINUED] potassium chloride SA (KLOR-CON) 20 MEQ tablet Take 1 tablet (20 mEq total) by mouth daily.  . [DISCONTINUED] simvastatin (ZOCOR) 80 MG tablet Take 80 mg by mouth at bedtime.     Allergies:   Hydrocodone   Social History   Tobacco Use  . Smoking status: Current Every Day Smoker    Packs/day: 0.25    Years: 25.00    Pack years: 6.25    Types: Cigarettes  . Smokeless tobacco: Never Used  . Tobacco comment: 1-2 cigs a day  Vaping Use  . Vaping Use: Never used  Substance Use Topics  . Alcohol use: Yes    Alcohol/week: 4.0 standard drinks    Types: 4 Cans of beer per week  . Drug use: No     Family Hx: The patient's family history includes COPD in his mother; Cancer in his father and maternal uncle; Diabetes in his mother.  ROS   EKGs/Labs/Other Test Reviewed:    EKG:  EKG is  ordered today.  The ekg ordered today demonstrates normal sinus rhythm, heart rate 75, normal axis, septal Q waves, inferolateral T wave inversions, QTC 431, no change from prior tracings  Recent Labs: 04/30/2020: ALT 48 05/07/2020: B Natriuretic Peptide 481.7 05/11/2020: TSH 6.023 05/12/2020: BUN 22; Creatinine, Ser 1.14; Hemoglobin 12.1; Magnesium 2.0; Platelets 256; Potassium 4.2; Sodium 135   Recent Lipid Panel Lab Results  Component Value  Date/Time   CHOL 141 05/11/2020 02:38 PM   CHOL 180 11/09/2017 10:50 AM   TRIG 163 (H) 05/11/2020 02:38 PM   HDL 24 (L) 05/11/2020 02:38 PM   HDL 31 (L) 11/09/2017 10:50 AM   CHOLHDL 5.9 05/11/2020 02:38 PM   LDLCALC 84 05/11/2020 02:38 PM   LDLCALC 89 11/09/2017 10:50 AM    Physical Exam:    VS:  BP 118/68   Pulse 75   Ht 5\' 9"  (1.753 m)   Wt 227 lb 12.8 oz (103.3 kg)   SpO2 95%   BMI 33.64 kg/m     Wt Readings from Last 3 Encounters:  05/25/20 227 lb 12.8 oz (103.3 kg)  05/12/20 221 lb 8 oz (100.5 kg)  04/30/20 214 lb 15.2 oz (97.5 kg)     Constitutional:      Appearance: Healthy appearance. Not in distress.  Neck:     Thyroid: No thyromegaly.     Vascular: JVD elevated.  Pulmonary:     Breath sounds: No wheezing. Bibasilar Rales (faint coarse breath sounds at the bases (?rales)) present.  Cardiovascular:     Normal rate. Regular rhythm. Normal S1. Normal S2.     Murmurs: There is no murmur.  Edema:    Pretibial: bilateral 1+ edema of the pretibial area.    Ankle: bilateral 1+ edema of the ankle. Abdominal:     Palpations: Abdomen is soft. There is no hepatomegaly.  Skin:    General: Skin is warm and dry.  Neurological:     General: No focal deficit present.     Mental Status: Alert and oriented to person, place and time.     Cranial Nerves: Cranial nerves are intact.       ASSESSMENT & PLAN:    1. Coronary artery disease involving native coronary artery of native heart without angina pectoris History of anterior myocardial infarction in May 2013 treated the drug-eluting stent to the LAD.  He has had multiple PCI procedures since.  He is now status post CABG 05/05/2020.  He seems to be progressing well since discharge from the hospital.  I have encouraged him to pursue cardiac rehabilitation.  He has been referred to the hospital at [facility].  He has follow-up with Dr. Servando Snare in the next few weeks.  Continue aspirin, beta-blocker, ACE inhibitor, statin  therapy.  2. Acute on chronic systolic CHF (congestive heart failure) (HCC) Ischemic cardiomyopathy.  NYHA II.  EF 30-35 by echocardiogram during recent hospitalization.  He continues to appear to be volume overloaded.  I have suggested that we increase his furosemide to 60 mg daily.  Continue potassium 20 mEq daily.  Obtain BMET today and BMET in 1 week.  Continue current dose of carvedilol, enalapril.  Consider adding low-dose spironolactone.  Continue LifeVest.  I will arrange a follow-up limited echo 90 days post bypass surgery.  If his EF remains <35%, he will need referral to EP for ICD implantation.  3. Postoperative atrial fibrillation (HCC) Maintaining sinus rhythm.  He remains on amiodarone.  He is also on Apixaban due to LV apical thrombus.  Continue amiodarone for the next 2 to 3 months and then consider discontinuation.  4. NSVT (nonsustained ventricular tachycardia) ([place]) He was noted to have nonsustained ventricular tachycardia in the hospital.  Continue to keep potassium above 4 and magnesium above 2.  Lab work will be obtained today and repeated in 1 week.  If potassium is less than 4, consider adding spironolactone 12.5 mg daily.  5. Left ventricular apical thrombus Continue anticoagulation with Apixaban.  Repeat echo will be obtained 90 days post bypass surgery.  Obtain CBC with follow-up BMET in 1 week.  6. Mixed hyperlipidemia He is on high-dose simvastatin therapy.  Potential for myopathy at this dose with amiodarone is elevated.  Therefore, discontinue simvastatin.  Start rosuvastatin 40 mg daily.  Obtain lipids and LFTs in 12 weeks.  If his LDL remains >70, consider adding ezetimibe or refer to lipid clinic for possible PCSK9 inhibitor therapy.    Dispo:  Return in about 4 weeks (around 06/22/2020) for Routine Follow Up w/ Dr. Angelena Form, in person.   Medication Adjustments/Labs and Tests Ordered: Current medicines are reviewed at length with the patient today.  Concerns  regarding medicines are outlined above.  Tests Ordered: Orders Placed This Encounter  Procedures  . Basic metabolic panel  . CBC  . Basic metabolic panel  . Lipid panel  . Hepatic function panel  . EKG 12-Lead  . ECHOCARDIOGRAM LIMITED   Medication Changes: Meds ordered this encounter  Medications  . potassium chloride SA (KLOR-CON) 20 MEQ tablet    Sig: Take 1 tablet (20 mEq total) by mouth daily.    Dispense:  90 tablet    Refill:  3  . rosuvastatin (CRESTOR) 40 MG tablet    Sig: Take 1 tablet (40 mg total) by mouth daily.    Dispense:  90 tablet    Refill:  3  . furosemide (LASIX) 40 MG tablet    Sig: Take 1.5 tablets (60 mg total) by mouth daily.    Dispense:  135 tablet    Refill:  3    Signed, Richardson Dopp, PA-C  05/25/2020 5:15 PM    [facility] [address], [place], [place]  [address] Phone: [phone]; Fax: [phone]

## 2020-05-25 ENCOUNTER — Ambulatory Visit (INDEPENDENT_AMBULATORY_CARE_PROVIDER_SITE_OTHER): Payer: Medicaid Other | Admitting: Physician Assistant

## 2020-05-25 ENCOUNTER — Other Ambulatory Visit: Payer: Self-pay

## 2020-05-25 ENCOUNTER — Encounter: Payer: Self-pay | Admitting: Physician Assistant

## 2020-05-25 VITALS — BP 118/68 | HR 75 | Ht 69.0 in | Wt 227.8 lb

## 2020-05-25 DIAGNOSIS — I2510 Atherosclerotic heart disease of native coronary artery without angina pectoris: Secondary | ICD-10-CM

## 2020-05-25 DIAGNOSIS — I4891 Unspecified atrial fibrillation: Secondary | ICD-10-CM

## 2020-05-25 DIAGNOSIS — I9789 Other postprocedural complications and disorders of the circulatory system, not elsewhere classified: Secondary | ICD-10-CM

## 2020-05-25 DIAGNOSIS — I513 Intracardiac thrombosis, not elsewhere classified: Secondary | ICD-10-CM

## 2020-05-25 DIAGNOSIS — I5023 Acute on chronic systolic (congestive) heart failure: Secondary | ICD-10-CM

## 2020-05-25 DIAGNOSIS — I472 Ventricular tachycardia: Secondary | ICD-10-CM | POA: Diagnosis not present

## 2020-05-25 DIAGNOSIS — I4729 Other ventricular tachycardia: Secondary | ICD-10-CM

## 2020-05-25 DIAGNOSIS — E782 Mixed hyperlipidemia: Secondary | ICD-10-CM

## 2020-05-25 MED ORDER — FUROSEMIDE 40 MG PO TABS: 60.0000 mg | ORAL_TABLET | Freq: Every day | ORAL | 3 refills | Status: DC

## 2020-05-25 MED ORDER — POTASSIUM CHLORIDE CRYS ER 20 MEQ PO TBCR: 20.0000 meq | EXTENDED_RELEASE_TABLET | Freq: Every day | ORAL | 3 refills | Status: DC

## 2020-05-25 MED ORDER — ROSUVASTATIN CALCIUM 40 MG PO TABS
40.0000 mg | ORAL_TABLET | Freq: Every day | ORAL | 3 refills | Status: DC
Start: 2020-05-25 — End: 2022-02-22

## 2020-05-25 NOTE — Patient Instructions (Addendum)
Medication Instructions:  Your physician has recommended you make the following change in your medication:   1) Stop Simvastatin 2) Start Rosuvastatin 40 mg, 1 tablet by mouth once a day 3) Increase Lasix to 60 mg (1.5 tablets) by mouth once a day  *If you need a refill on your cardiac medications before your next appointment, please call your pharmacy*  Lab Work:  You will have labs drawn today: BMET  Your physician recommends that you return for lab work in 1 week for BMET/CBC  Your physician recommends that you return for lab work in 12 weeks for Lipid panel/LFTs  If you have labs (blood work) drawn today and your tests are completely normal, you will receive your results only by: Marland Kitchen MyChart Message (if you have MyChart) OR . A paper copy in the mail If you have any lab test that is abnormal or we need to change your treatment, we will call you to review the results.  Testing/Procedures:  Your physician has requested that you have an limited echocardiogram on or after 08/05/20. Echocardiography is a painless test that uses sound waves to create images of your heart. It provides your doctor with information about the size and shape of your heart and how well your heart's chambers and valves are working. This procedure takes approximately one hour. There are no restrictions for this procedure.  Follow-Up: At [facility], you and your health needs are our priority.  As part of our continuing mission to provide you with exceptional heart care, we have created designated Provider Care Teams.  These Care Teams include your primary Cardiologist (physician) and Advanced Practice Providers (APPs -  Physician Assistants and Nurse Practitioners) who all work together to provide you with the care you need, when you need it.  We recommend signing up for the patient portal called "MyChart".  Sign up information is provided on this After Visit Summary.  MyChart is used to connect with patients for  Virtual Visits (Telemedicine).  Patients are able to view lab/test results, encounter notes, upcoming appointments, etc.  Non-urgent messages can be sent to your provider as well.   To learn more about what you can do with MyChart, go to [email or web address].    Your next appointment:   4-6 week(s)  The format for your next appointment:   In Person  Provider:   [name], MD  Other Instructions  Weigh yourself daily, call if weight is greater than 3 pounds in 1 day.

## 2020-06-01 ENCOUNTER — Other Ambulatory Visit: Payer: Medicaid Other

## 2020-06-02 ENCOUNTER — Other Ambulatory Visit: Payer: Self-pay | Admitting: Cardiothoracic Surgery

## 2020-06-02 DIAGNOSIS — Z951 Presence of aortocoronary bypass graft: Secondary | ICD-10-CM

## 2020-06-03 ENCOUNTER — Ambulatory Visit
Admission: RE | Admit: 2020-06-03 | Discharge: 2020-06-03 | Disposition: A | Discharge status: Home / Self Care | Payer: Medicaid Other | Source: Ambulatory Visit | Attending: Cardiothoracic Surgery | Admitting: Cardiothoracic Surgery

## 2020-06-03 ENCOUNTER — Other Ambulatory Visit: Payer: Medicaid Other | Admitting: *Deleted

## 2020-06-03 ENCOUNTER — Other Ambulatory Visit: Payer: Self-pay

## 2020-06-03 ENCOUNTER — Ambulatory Visit (INDEPENDENT_AMBULATORY_CARE_PROVIDER_SITE_OTHER): Payer: Self-pay | Admitting: Cardiothoracic Surgery

## 2020-06-03 VITALS — BP 127/82 | HR 88 | Temp 97.8°F | Resp 20 | Ht 69.0 in | Wt 213.0 lb

## 2020-06-03 DIAGNOSIS — I2510 Atherosclerotic heart disease of native coronary artery without angina pectoris: Secondary | ICD-10-CM

## 2020-06-03 DIAGNOSIS — Z951 Presence of aortocoronary bypass graft: Secondary | ICD-10-CM

## 2020-06-03 DIAGNOSIS — I5023 Acute on chronic systolic (congestive) heart failure: Secondary | ICD-10-CM

## 2020-06-03 DIAGNOSIS — E782 Mixed hyperlipidemia: Secondary | ICD-10-CM

## 2020-06-03 NOTE — Progress Notes (Signed)
[address]       [place] [address]             [phone]      [facility] Medical Record #[id number] Date of Birth: 03-29-1966  Referring: Kathleene Hazel* Primary Care: Fleet Contras, MD Primary Cardiologist: [name], MD   Chief Complaint:   POST OP FOLLOW UP DATE OF PROCEDURE:  05/05/2020  PREOPERATIVE DIAGNOSIS:  Severe left ventricular dysfunction with restenosis of left anterior descending stents and progression of coronary artery disease.  POSTOPERATIVE DIAGNOSIS:  Severe left ventricular dysfunction with restenosis of left anterior descending stents and progression of coronary artery disease.  SURGICAL PROCEDURE:  Coronary artery bypass grafting x3 with the left internal mammary to the left anterior descending coronary artery, reverse saphenous vein graft to the diagonal coronary artery, reverse saphenous vein graft to the distal right  coronary artery, with left thigh endoscopic vein harvesting of the greater saphenous vein.  History of Present Illness:     Patient returns to the office for follow-up after recent coronary artery bypass grafting.  Considering the patient's significantly deep depressed LV function and very poorly controlled diabetes he is doing remarkably well postoperatively.  He has had no recurrent angina, no overt symptoms of heart failure.   He has been wearing his LifeVest which she was discharged home with.     Past Medical History:  Diagnosis Date  . Acute myocardial infarction of other anterior wall, subsequent episode of care 12/12/2011  . Anal fissure   . Arthritis    back  . Atherosclerosis of native artery of both lower extremities with intermittent claudication (HCC) 04/01/2018  . Bruises easily    d/t being on Effient  . CAD (coronary artery disease)    A. Acute Ant STEMI 12/12/2011   . Cardiomyopathy secondary 12/26/2011  . Chronic total occlusion of artery of the  extremities (HCC) 02/26/2012  . Claudication (HCC) 12/26/2011  . Coronary atherosclerosis of native coronary artery    a. ant STEMI with cardiac arrest 2013 s/p DES to mLAD. b. [place] 10/2016 s/p PTCA to mLAD.  . Diabetes mellitus without complication (HCC)   . Essential hypertension 10/23/2016  . GERD (gastroesophageal reflux disease)    "takes tums"  . History of blood transfusion    no abnormal reaction noted  . History of kidney stones   . Hypertension   . Ischemic cardiomyopathy    a. EF 35% in 02/2012 at time of acute MI, improved to normal on subsequent imaging.  . MI (myocardial infarction) (HCC)    AMI 1/13 - complicated by VT/Tosades  . Mixed hyperlipidemia   . Numbness and tingling of right arm 03/31/2013  . Occlusion and stenosis of carotid artery without mention of cerebral infarction 08/26/2012  . PAD (peripheral artery disease) (HCC)    followed by Vascular - aortobifem bypass 01/2013 & left carotid-subclavian artery bypass 04/2013  . Peripheral vascular disease, unspecified (HCC) 12/16/2012  . PVD (peripheral vascular disease) (HCC) 03/31/2013  . Rectal polyp   . Subclavian steal syndrome 08/11/2013  . Uncontrolled diabetes mellitus (HCC) 10/23/2016  . Wears partial dentures    upper     Social History   Tobacco Use  Smoking Status Current Every Day Smoker  . Packs/day: 0.25  . Years: 25.00  . Pack years: 6.25  . Types: Cigarettes  Smokeless Tobacco Never Used  Tobacco Comment   1-2 cigs a day    Social  History   Substance and Sexual Activity  Alcohol Use Yes  . Alcohol/week: 4.0 standard drinks  . Types: 4 Cans of beer per week     Allergies  Allergen Reactions  . Hydrocodone Hives and Nausea And Vomiting    Current Outpatient Medications  Medication Sig Dispense Refill  . amiodarone (PACERONE) 200 MG tablet Take 2 tablets (400 mg total) by mouth 2 (two) times daily. For 5 days, then decrease to 200 mg BID x 7 days, then decrease to 200 mg daily 90 tablet  1  . apixaban (ELIQUIS) 5 MG TABS tablet Take 1 tablet (5 mg total) by mouth 2 (two) times daily. 60 tablet 3  . aspirin EC 81 MG tablet Take 1 tablet (81 mg total) by mouth daily. 90 tablet 3  . carvedilol (COREG) 12.5 MG tablet Take 1 tablet (12.5 mg total) by mouth 2 (two) times daily with a meal. 60 tablet 3  . cyclobenzaprine (FLEXERIL) 10 MG tablet Take 1 tablet (10 mg total) by mouth 2 (two) times daily as needed for muscle spasms. 30 tablet 0  . enalapril (VASOTEC) 5 MG tablet Take 1 tablet (5 mg total) by mouth daily. 60 tablet 2  . furosemide (LASIX) 40 MG tablet Take 1.5 tablets (60 mg total) by mouth daily. 135 tablet 3  . gabapentin (NEURONTIN) 300 MG capsule Take 300 mg by mouth 3 (three) times daily.    . Insulin Glargine (LANTUS SOLOSTAR) 100 UNIT/ML Solostar Pen Inject 80 Units into the skin at bedtime.     . insulin lispro (HUMALOG) 100 UNIT/ML injection Inject into the skin 3 (three) times daily before meals. 10-15 units sliding scale prn    . oxyCODONE-acetaminophen (PERCOCET) 10-325 MG tablet Take 1 tablet by mouth every 4 (four) hours as needed for pain.    . pantoprazole (PROTONIX) 40 MG tablet Take 40 mg by mouth daily.    . potassium chloride SA (KLOR-CON) 20 MEQ tablet Take 1 tablet (20 mEq total) by mouth daily. 90 tablet 3  . rosuvastatin (CRESTOR) 40 MG tablet Take 1 tablet (40 mg total) by mouth daily. 90 tablet 3  . traZODone (DESYREL) 100 MG tablet Take 100 mg by mouth at bedtime as needed for sleep.    . Vitamin D, Ergocalciferol, (DRISDOL) 1.25 MG (50000 UNIT) CAPS capsule Take 50,000 Units by mouth once a week.    . nitroGLYCERIN (NITROSTAT) 0.4 MG SL tablet Place 1 tablet (0.4 mg total) under the tongue every 5 (five) minutes as needed for chest pain. 25 tablet 3   No current facility-administered medications for this visit.       Physical Exam: BP 127/82   Pulse 88   Temp 97.8 F (36.6 C) (Skin)   Resp 20   Ht 5\' 9"  (1.753 m)   Wt 213 lb (96.6 kg)    SpO2 97% Comment: RA  BMI 31.45 kg/m   General appearance: alert, cooperative and no distress Neurologic: intact Heart: regular rate and rhythm, S1, S2 normal, no murmur, click, rub or gallop Lungs: clear to auscultation bilaterally Abdomen: soft, non-tender; bowel sounds normal; no masses,  no organomegaly Extremities: extremities normal, atraumatic, no cyanosis or edema and Homans sign is negative, no sign of DVT Wound: Sternum is stable well-healed   Diagnostic Studies & Laboratory data:     Recent Radiology Findings:   DG Chest 2 View  Result Date: 06/03/2020 CLINICAL DATA:  Post CABG on 05/05/2020 EXAM: CHEST - 2 VIEW COMPARISON:  05/10/2020  FINDINGS: Minimal enlargement of cardiac silhouette post CABG. Mediastinal contours and pulmonary vascularity normal. Minimal bibasilar atelectasis, improved from previous exam. No pulmonary infiltrate, pleural effusion or pneumothorax. Osseous structures unremarkable. IMPRESSION: Minimal enlargement of cardiac silhouette post CABG. Improved bibasilar atelectasis. Electronically Signed   By: Ulyses Southward M.D.   On: 06/03/2020 12:31    I have independently reviewed the above radiology studies  and reviewed the findings with the patient.    Recent Lab Findings: Lab Results  Component Value Date   WBC 10.5 05/12/2020   HGB 12.1 (L) 05/12/2020   HCT 37.1 (L) 05/12/2020   PLT 256 05/12/2020   GLUCOSE 239 (H) 05/12/2020   CHOL 141 05/11/2020   TRIG 163 (H) 05/11/2020   HDL 24 (L) 05/11/2020   LDLCALC 84 05/11/2020   ALT 48 (H) 04/30/2020   AST 35 04/30/2020   NA 135 05/12/2020   K 4.2 05/12/2020   CL 99 05/12/2020   CREATININE 1.14 05/12/2020   BUN 22 (H) 05/12/2020   CO2 27 05/12/2020   TSH 6.023 (H) 05/11/2020   INR 1.3 (H) 05/05/2020   HGBA1C 11.8 (H) 04/30/2020      Assessment / Plan:   #1 coronary artery disease status post coronary artery bypass grafting-after history of acute anterior myocardial infarction in 2013  initially treated with stent treated with drug-eluting stents to the LAD -due to progression of disease in-stent stenosis he underwent coronary artery bypass grafting-patient is making good progress postoperatively. #2 acute on chronic congestive heart failure-improved at time of cardiac catheterization echocardiogram revealed 30 to 35%  ejection fraction #3 postoperative atrial fibrillation-on apixaban for atrial fib and also for left apical thrombus #4 currently with LifeVest, waiting for follow-up echocardiogram and decision about referral to EP for defibrillator placement.    Medication Changes: No orders of the defined types were placed in this encounter.     Delight Ovens MD      [address] [place] [place] [address] Office [phone]     06/03/2020 1:07 PM

## 2020-06-09 LAB — BASIC METABOLIC PANEL
BUN/Creatinine Ratio: 12 (ref 9–20)
BUN: 14 mg/dL (ref 6–24)
CO2: 19 mmol/L — ABNORMAL LOW (ref 20–29)
Calcium: 9.4 mg/dL (ref 8.7–10.2)
Chloride: 97 mmol/L (ref 96–106)
Creatinine, Ser: 1.14 mg/dL (ref 0.76–1.27)
GFR calc Af Amer: 84 mL/min/{1.73_m2} (ref >59)
GFR calc non Af Amer: 73 mL/min/{1.73_m2} (ref >59)
Glucose: 278 mg/dL — ABNORMAL HIGH (ref 65–99)
Potassium: 4.4 mmol/L (ref 3.5–5.2)
Sodium: 138 mmol/L (ref 134–144)

## 2020-06-09 LAB — LIPID PANEL
Chol/HDL Ratio: 5.2 ratio — ABNORMAL HIGH (ref 0.0–5.0)
Cholesterol, Total: 157 mg/dL (ref 100–199)
HDL: 30 mg/dL — ABNORMAL LOW (ref >39)
LDL Chol Calc (NIH): 83 mg/dL (ref 0–99)
Triglycerides: 265 mg/dL — ABNORMAL HIGH (ref 0–149)
VLDL Cholesterol Cal: 44 mg/dL — ABNORMAL HIGH (ref 5–40)

## 2020-06-09 LAB — CBC
Hematocrit: 42.4 % (ref 37.5–51.0)
Hemoglobin: 13.9 g/dL (ref 13.0–17.7)
MCH: 31.1 pg (ref 26.6–33.0)
MCHC: 32.8 g/dL (ref 31.5–35.7)
MCV: 95 fL (ref 79–97)
Platelets: 315 10*3/uL (ref 150–450)
RBC: 4.47 x10E6/uL (ref 4.14–5.80)
RDW: 13.3 % (ref 11.6–15.4)
WBC: 9.2 10*3/uL (ref 3.4–10.8)

## 2020-06-10 ENCOUNTER — Telehealth: Payer: Self-pay | Admitting: Physician Assistant

## 2020-06-10 DIAGNOSIS — I10 Essential (primary) hypertension: Secondary | ICD-10-CM

## 2020-06-10 DIAGNOSIS — I5023 Acute on chronic systolic (congestive) heart failure: Secondary | ICD-10-CM

## 2020-06-10 NOTE — Telephone Encounter (Signed)
    Pt is returning call to get lab results 

## 2020-06-11 MED ORDER — SPIRONOLACTONE 25 MG PO TABS
12.5000 mg | ORAL_TABLET | Freq: Every day | ORAL | 6 refills | Status: DC
Start: 2020-06-11 — End: 2020-11-24

## 2020-06-11 MED ORDER — POTASSIUM CHLORIDE ER 10 MEQ PO TBCR: 10.0000 meq | EXTENDED_RELEASE_TABLET | Freq: Every day | ORAL | 6 refills | Status: DC

## 2020-06-11 NOTE — Telephone Encounter (Signed)
-----   Message from Beatrice Lecher, PA-C sent at 06/09/2020  8:22 AM EDT ----- LDL, Triglycerides above goal but I just changed his statin.  He was supposed to get a Lipid panel in 12 weeks (not now).   Hgb improved/normal.  Glucose high, Creatinine, K+ normal.   PLAN:   - Credit Lipid panel (he should not be charged)  - make sure Lipid panel rescheduled for 10-12 weeks from now (with LFTs)  - For his CHF, let's start Spironolactone 12.5 mg once daily   - Reduce K+ to 10 mEq once daily   - BMET once weekly x 2 Tereso Newcomer, PA-C    06/09/2020 8:16 AM

## 2020-06-11 NOTE — Telephone Encounter (Signed)
The patient has been notified of the result and verbalized understanding.  All questions (if any) were answered. Patient aware to decrease Potassium to 10 mEq once a day and to start Spironolactone 25 mg, 0.5 tablet by mouth once day; prescriptions sent to pharmacy. Patient will come back on 06/18/20 and 06/25/20 for repeat BMETs (orders in); patient also aware to keep August appointment with Dr. [name]. Lajoyce Corners, CMA 06/11/2020 4:28 PM

## 2020-06-18 ENCOUNTER — Other Ambulatory Visit: Payer: Self-pay

## 2020-06-18 ENCOUNTER — Other Ambulatory Visit: Payer: Medicaid Other | Admitting: *Deleted

## 2020-06-18 DIAGNOSIS — I10 Essential (primary) hypertension: Secondary | ICD-10-CM

## 2020-06-18 DIAGNOSIS — E782 Mixed hyperlipidemia: Secondary | ICD-10-CM

## 2020-06-18 DIAGNOSIS — I2510 Atherosclerotic heart disease of native coronary artery without angina pectoris: Secondary | ICD-10-CM

## 2020-06-18 DIAGNOSIS — I5023 Acute on chronic systolic (congestive) heart failure: Secondary | ICD-10-CM

## 2020-06-18 LAB — BASIC METABOLIC PANEL
BUN/Creatinine Ratio: 15 (ref 9–20)
BUN: 17 mg/dL (ref 6–24)
CO2: 25 mmol/L (ref 20–29)
Calcium: 9.4 mg/dL (ref 8.7–10.2)
Chloride: 95 mmol/L — ABNORMAL LOW (ref 96–106)
Creatinine, Ser: 1.11 mg/dL (ref 0.76–1.27)
GFR calc Af Amer: 87 mL/min/{1.73_m2} (ref >59)
GFR calc non Af Amer: 75 mL/min/{1.73_m2} (ref >59)
Glucose: 142 mg/dL — ABNORMAL HIGH (ref 65–99)
Potassium: 4.3 mmol/L (ref 3.5–5.2)
Sodium: 133 mmol/L — ABNORMAL LOW (ref 134–144)

## 2020-06-18 LAB — HEPATIC FUNCTION PANEL
ALT: 23 IU/L (ref 0–44)
AST: 24 IU/L (ref 0–40)
Albumin: 4.2 g/dL (ref 3.8–4.9)
Alkaline Phosphatase: 178 IU/L — ABNORMAL HIGH (ref 48–121)
Bilirubin Total: 0.3 mg/dL (ref 0.0–1.2)
Bilirubin, Direct: 0.15 mg/dL (ref 0.00–0.40)
Total Protein: 7.3 g/dL (ref 6.0–8.5)

## 2020-06-25 ENCOUNTER — Other Ambulatory Visit: Payer: Medicaid Other

## 2020-07-19 ENCOUNTER — Ambulatory Visit (INDEPENDENT_AMBULATORY_CARE_PROVIDER_SITE_OTHER): Payer: Medicaid Other | Admitting: Cardiovascular Disease

## 2020-07-19 ENCOUNTER — Encounter: Payer: Self-pay | Admitting: Cardiovascular Disease

## 2020-07-19 ENCOUNTER — Other Ambulatory Visit: Payer: Self-pay

## 2020-07-19 VITALS — BP 116/70 | HR 51 | Ht 69.0 in | Wt 213.0 lb

## 2020-07-19 DIAGNOSIS — I255 Ischemic cardiomyopathy: Secondary | ICD-10-CM

## 2020-07-19 DIAGNOSIS — I2510 Atherosclerotic heart disease of native coronary artery without angina pectoris: Secondary | ICD-10-CM | POA: Diagnosis not present

## 2020-07-19 DIAGNOSIS — I480 Paroxysmal atrial fibrillation: Secondary | ICD-10-CM

## 2020-07-19 DIAGNOSIS — I10 Essential (primary) hypertension: Secondary | ICD-10-CM | POA: Diagnosis not present

## 2020-07-19 DIAGNOSIS — I739 Peripheral vascular disease, unspecified: Secondary | ICD-10-CM

## 2020-07-19 NOTE — Progress Notes (Signed)
Chief Complaint  Patient presents with  . Follow-up    CAD   History of Present Illness: 54 yo male with history of ongoing tobacco abuse, CAD, HLD, PAD, HLD, ischemic cardiomyopathy and insulin dependent diabetes mellitus who is here today for cardiac follow up. He had an anterior STEMI in January 2013 with cardiac arrest and found to have an occluded mid LAD treated with a drug eluting stent. The third OM branch was chronically occluded and the RCA had mild disease. His post MI LVEF was 35% but this was found to be normal on follow up echo. Cardiac cath in November 2017 with severe stent restenosis in the LAD, treated with a cutting balloon angioplasty. He did not keep his f/u appt in our office following his PCI. He has been non-compliant with follow up, medications over the past few years and continues to smoke. Admitted to [facility] in August 2018 with an anterior STEMI. His LAD was occluded proximal to the old stent and this was treated with a drug eluting stent. Angioplasty performed on the ostial Diagonal branch. LVEF was 40% following the MI. He had unstable angina in June 2020 and repeat cardiac cath showed severe restenosis in the mid LAD stented segment which was treated with cutting balloon angioplasty. He also had ostial stenosis of the diagonal branch (jailed by LAD stents), mild to moderate non-obstructive disease in the mid RCA and moderate LV systolic dysfunction with anteroapical hypokinesis (c/w findings on echo in 2018 at [facility]). LVEF=35-45%. He was placed on DAPT w/ ASA and Brilinta and continued on statin, ? blocker, ACEi and long acting nitrate. I saw him in May 2021 and he c/o chest pain c/w unstable angina. Cardiac cath 04/22/20 with progression of disease in the LAD and RCA. He underwent 3V CABG 05/25/20. (LIMA to LAD, SVG to Diagonal and SVG to RCA). His post-operative course was complicated by atrial fibrillation and he was started on amiodarone. Echo with  LVEF=30-35% with apical thrombus. He was discharged on Eliquis with a Lifevest. Echo 05/11/20 with LVEF=30-35%. Plans for repeat echo in September 2021.   His PAD is followed in [facility] by Dr. Myra Gianotti. In 2013 he was found to have occlusion of his distal aorta and left subclavian artery. He underwent aortobifemoral bypass in February 2014 and left carotid to subclavian bypass in May 2014.  InApril2019,he underwent right femoral to AK popliteal bypass with saphenous vein and right femoral endarterectomy  He is here today for follow up. The patient denies any chest pain, dyspnea, palpitations, lower extremity edema, orthopnea, PND, dizziness, near syncope or syncope. He is feeling well overall.      Primary Care Physician: Fleet Contras, MD  Past Medical History:  Diagnosis Date  . Acute myocardial infarction of other anterior wall, subsequent episode of care 12/12/2011  . Anal fissure   . Arthritis    back  . Atherosclerosis of native artery of both lower extremities with intermittent claudication (HCC) 04/01/2018  . Bruises easily    d/t being on Effient  . CAD (coronary artery disease)    A. Acute Ant STEMI 12/12/2011   . Cardiomyopathy secondary 12/26/2011  . Chronic total occlusion of artery of the extremities (HCC) 02/26/2012  . Claudication (HCC) 12/26/2011  . Coronary atherosclerosis of native coronary artery    a. ant STEMI with cardiac arrest 2013 s/p DES to mLAD. b. [place] 10/2016 s/p PTCA to mLAD.  . Diabetes mellitus without complication (HCC)   .  Essential hypertension 10/23/2016  . GERD (gastroesophageal reflux disease)    "takes tums"  . History of blood transfusion    no abnormal reaction noted  . History of kidney stones   . Hypertension   . Ischemic cardiomyopathy    a. EF 35% in 02/2012 at time of acute MI, improved to normal on subsequent imaging.  . MI (myocardial infarction) (HCC)    AMI 1/13 - complicated by VT/Tosades  . Mixed hyperlipidemia   . Numbness and tingling of  right arm 03/31/2013  . Occlusion and stenosis of carotid artery without mention of cerebral infarction 08/26/2012  . PAD (peripheral artery disease) (HCC)    followed by [facility] - aortobifem bypass 01/2013 & left carotid-subclavian artery bypass 04/2013  . Peripheral vascular disease, unspecified (HCC) 12/16/2012  . PVD (peripheral vascular disease) (HCC) 03/31/2013  . Rectal polyp   . Subclavian steal syndrome 08/11/2013  . Uncontrolled diabetes mellitus (HCC) 10/23/2016  . Wears partial dentures    upper    Past Surgical History:  Procedure Laterality Date  . ABDOMINAL AORTOGRAM W/LOWER EXTREMITY N/A 06/26/2017   Procedure: Abdominal Aortogram w/Lower Extremity;  Surgeon: Nada Libman, MD;  [facility]: [facility] IN[facility];  Service: Cardiovascular;  Laterality: N/A;  . ANAL FISSURECTOMY    . AORTA - BILATERAL FEMORAL ARTERY BYPASS GRAFT N/A 01/16/2013   Procedure: AORTA BIFEMORAL BYPASS GRAFT;  Surgeon: Nada Libman, MD;  Location: [facility] OR;  Service: Vascular;  Laterality: N/A;  . APPENDECTOMY    . CARDIAC CATHETERIZATION N/A 10/23/2016   Procedure: Left Heart Cath and Coronary Angiography;  Surgeon: Kathleene Hazel, MD;  Location: [facility] INVASIVE CV LAB;  Service: Cardiovascular;  Laterality: N/A;  . CARDIAC CATHETERIZATION N/A 10/23/2016   Procedure: Coronary Balloon Angioplasty;  Surgeon: Kathleene Hazel, MD;  Location: [facility] INV[facility] CV [facility];  Service: Cardiovascular;  Laterality: N/A;  . CAROTID-SUBCLAVIAN BYPASS GRAFT Left 04/03/2013   Procedure: BYPASS GRAFT CAROTID-SUBCLAVIAN;  Surgeon: Nada Libman, MD;  Location: [facility] OR;  Service: Vascular;  Laterality: Left;  . CIRCUMCISION N/A 12/15/2015   Procedure: CIRCUMCISION ADULT;  Surgeon: Malen Gauze, MD;  Location: [facility]S;  Service: Urology;  Laterality: N/A;  . COLONOSCOPY    . CORONARY ANGIOPLASTY     stent placed Dec 12, 2011 and 2018  . CORONARY ARTERY BYPASS GRAFT N/A 05/05/2020   Procedure: CORONARY ARTERY BYPASS  GRAFTING (CABG) x Three, Using Left internal Mammary Artery and Left Leg greater saphenous vein harvested endoscopically;  Surgeon: Delight Ovens, MD;  Location: [facility] OR;  Service: Open Heart Surgery;  Laterality: N/A;  . CORONARY BALLOON ANGIOPLASTY N/A 05/23/2019   Procedure: CORONARY BALLOON ANGIOPLASTY;  Surgeon: Kathleene Hazel, MD;  [facility]: [facility];  Service: Cardiovascular;  Laterality: N/A;  . DIAGNOSTIC LAPAROSCOPY    . ENDARTERECTOMY FEMORAL Right 03/13/2018   Procedure: FEMORAL ENDARTERECTOMY RIGHT;  Surgeon: Nada Libman, MD;  Location: [facility] OR;  Service: Vascular;  Laterality: Right;  . FEMORAL-POPLITEAL BYPASS GRAFT Right 03/13/2018   Procedure: FEMORAL-POPLITEAL ARTERY BYPASS WITH NON-REVERSE VEIN RIGHT;  Surgeon: Nada Libman, MD;  Location: [facility] OR;  Service: Vascular;  Laterality: Right;  . GROIN DISSECTION Right 03/13/2018   Procedure: RE-DO COMMON FEMORAL ARTERY EXPOSURE;  Surgeon: Nada Libman, MD;  Location: [facility] OR;  Service: Vascular;  Laterality: Right;  . I & D EXTREMITY Left 04/18/2013   Procedure: debridement of left neck lymphocele;  Surgeon: Nada Libman, MD;  Location: [facility]  OR;  Service: Vascular;  Laterality: Left;  I and D of left neck  . I & D EXTREMITY Right 11/21/2014   Procedure: IRRIGATION AND DEBRIDEMENT RIGHT HAND;  Surgeon: Dominica Severin, MD;  Location: [facility] OR;  Service: Orthopedics;  Laterality: Right;  . INTRAVASCULAR PRESSURE WIRE/FFR STUDY N/A 04/22/2020   Procedure: INTRAVASCULAR PRESSURE WIRE/FFR STUDY;  Surgeon: Kathleene Hazel, MD;  [facility];  Service: Cardiovascular;  Laterality: N/A;  . LEFT HEART CATH AND CORONARY ANGIOGRAPHY N/A 05/23/2019   Procedure: LEFT HEART CATH AND CORONARY ANGIOGRAPHY;  Surgeon: Kathleene Hazel, MD;  [facility]: [facility];  Service: Cardiovascular;  Laterality: N/A;  . LEFT HEART CATH AND CORONARY ANGIOGRAPHY N/A 04/22/2020   Procedure: LEFT HEART CATH AND  CORONARY ANGIOGRAPHY;  Surgeon: Kathleene Hazel, MD;  [facility];  Service: Cardiovascular;  Laterality: N/A;  . LEFT HEART CATHETERIZATION WITH CORONARY ANGIOGRAM N/A 12/12/2011   Procedure: LEFT HEART CATHETERIZATION WITH CORONARY ANGIOGRAM;  Surgeon: Kathleene Hazel, MD;  Location: [facility] CATH LAB;  Service: Cardiovascular;  Laterality: N/A;  . LOWER EXTREMITY ANGIOGRAM N/A 01/31/2012   Procedure: LOWER EXTREMITY ANGIOGRAM;  Surgeon: Kathleene Hazel, MD;  Location: [facility] CATH LAB;  Service: Cardiovascular;  Laterality: N/A;  . LOWER EXTREMITY ANGIOGRAPHY N/A 04/23/2018   Procedure: LOWER EXTREMITY ANGIOGRAPHY;  Surgeon: Nada Libman, MD;  [facility];  Service: Cardiovascular;  Laterality: N/A;  . PERCUTANEOUS CORONARY STENT INTERVENTION (PCI-S) Right 12/12/2011   Procedure: PERCUTANEOUS CORONARY STENT INTERVENTION (PCI-S);  Surgeon: Kathleene Hazel, MD;  Location: [facility] CATH LAB;  Service: Cardiovascular;  Laterality: Right;  . PERIPHERAL VASCULAR INTERVENTION Left 04/23/2018   Procedure: PERIPHERAL VASCULAR INTERVENTION;  Surgeon: Nada Libman, MD;  [facility];  Service: Cardiovascular;  Laterality: Left;  superficial femoral  . REPAIR EXTENSOR TENDON Right 11/21/2014   Procedure: WITH REPAIR/RECONSTRUCTION OF EXTENSOR TENDONS AS NEEDED;  Surgeon: Dominica Severin, MD;  Location: [facility] OR;  Service: Orthopedics;  Laterality: Right;  . TEE WITHOUT CARDIOVERSION N/A 05/05/2020   Procedure: TRANSESOPHAGEAL ECHOCARDIOGRAM (TEE);  Surgeon: Delight Ovens, MD;  Location: [facility] OR;  Service: Open Heart Surgery;  Laterality: N/A;    Current Outpatient Medications  Medication Sig Dispense Refill  . amiodarone (PACERONE) 200 MG tablet Take 200 mg by mouth daily.    Marland Kitchen apixaban (ELIQUIS) 5 MG TABS tablet Take 1 tablet (5 mg total) by mouth 2 (two) times daily. 60 tablet 3  . aspirin EC 81 MG tablet Take 1 tablet (81 mg total) by mouth  daily. 90 tablet 3  . carvedilol (COREG) 12.5 MG tablet Take 1 tablet (12.5 mg total) by mouth 2 (two) times daily with a meal. 60 tablet 3  . cyclobenzaprine (FLEXERIL) 10 MG tablet Take 1 tablet (10 mg total) by mouth 2 (two) times daily as needed for muscle spasms. 30 tablet 0  . enalapril (VASOTEC) 5 MG tablet Take 1 tablet (5 mg total) by mouth daily. 60 tablet 2  . furosemide (LASIX) 40 MG tablet Take 1.5 tablets (60 mg total) by mouth daily. 135 tablet 3  . gabapentin (NEURONTIN) 300 MG capsule Take 300 mg by mouth 3 (three) times daily.    . Insulin Glargine (LANTUS SOLOSTAR) 100 UNIT/ML Solostar Pen Inject 80 Units into the skin at bedtime.     . insulin lispro (HUMALOG) 100 UNIT/ML injection Inject into the skin 3 (three) times daily before meals. 10-15 units sliding  scale prn    . oxyCODONE-acetaminophen (PERCOCET) 10-325 MG tablet Take 1 tablet by mouth every 4 (four) hours as needed for pain.    . pantoprazole (PROTONIX) 40 MG tablet Take 40 mg by mouth daily.    . potassium chloride (KLOR-CON) 10 MEQ tablet Take 1 tablet (10 mEq total) by mouth daily. 30 tablet 6  . rosuvastatin (CRESTOR) 40 MG tablet Take 1 tablet (40 mg total) by mouth daily. 90 tablet 3  . spironolactone (ALDACTONE) 25 MG tablet Take 0.5 tablets (12.5 mg total) by mouth daily. 15 tablet 6  . traZODone (DESYREL) 100 MG tablet Take 100 mg by mouth at bedtime as needed for sleep.    . Vitamin D, Ergocalciferol, (DRISDOL) 1.25 MG (50000 UNIT) CAPS capsule Take 50,000 Units by mouth once a week.    . nitroGLYCERIN (NITROSTAT) 0.4 MG SL tablet Place 1 tablet (0.4 mg total) under the tongue every 5 (five) minutes as needed for chest pain. 25 tablet 3   No current facility-administered medications for this visit.    Allergies  Allergen Reactions  . Hydrocodone Hives and Nausea And Vomiting    Social History   Socioeconomic History  . Marital status: Divorced    Spouse name: Not on file  . Number of children:  Not on file  . Years of education: Not on file  . Highest education level: Not on file  Occupational History    Employer: OTHER  Tobacco Use  . Smoking status: Current Every Day Smoker    Packs/day: 0.25    Years: 25.00    Pack years: 6.25    Types: Cigarettes  . Smokeless tobacco: Never Used  . Tobacco comment: 1-2 cigs a day  Vaping Use  . Vaping Use: Never used  Substance and Sexual Activity  . Alcohol use: Yes    Alcohol/week: 4.0 standard drinks    Types: 4 Cans of beer per week  . Drug use: No  . Sexual activity: Yes  Other Topics Concern  . Not on file  Social History Narrative   Disabled - no longer works   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 Strain:   . Difficulty of Paying Living Expenses:   Food Insecurity:   . Worried About Programme researcher, broadcasting/film/video in the Last Year:   . Barista in the Last Year:   Transportation Needs:   . Freight forwarder (Medical):   Marland Kitchen Lack of Transportation (Non-Medical):   Physical Activity:   . Days of Exercise per Week:   . Minutes of Exercise per Session:   Stress:   . Feeling of Stress :   Social Connections:   . Frequency of Communication with Friends and Family:   . Frequency of Social Gatherings with Friends and Family:   . Attends Religious Services:   . Active Member of Clubs or Organizations:   . Attends Banker Meetings:   Marland Kitchen Marital Status:   Intimate Partner Violence:   . Fear of Current or Ex-Partner:   . Emotionally Abused:   Marland Kitchen Physically Abused:   . Sexually Abused:     Family History  Problem Relation Age of Onset  . Cancer Father        Lung  . Diabetes Mother   . COPD Mother   . Cancer Maternal Uncle        Colon    Review of Systems:  As stated in the HPI and otherwise negative.  BP 116/70   Pulse (!) 51   Ht 5\' 9"  (1.753 m)   Wt 213 lb (96.6 kg)   SpO2 95%   BMI 31.45 kg/m   Physical Examination:  General: Well developed, well nourished, NAD  HEENT:  OP clear, mucus membranes moist  SKIN: warm, dry. No rashes. Neuro: No focal deficits  Musculoskeletal: Muscle strength 5/5 all ext  Psychiatric: Mood and affect normal  Neck: No JVD, no carotid bruits, no thyromegaly, no lymphadenopathy.  Lungs:Clear bilaterally, no wheezes, rhonci, crackles Cardiovascular: Regular rate and rhythm. No murmurs, gallops or rubs. Abdomen:Soft. Bowel sounds present. Non-tender.  Extremities: No lower extremity edema. Pulses are 2 + in the bilateral DP/PT.  EKG:  EKG is not ordered today. The ekg ordered today demonstrates   Recent Labs: 05/07/2020: B Natriuretic Peptide 481.7 05/11/2020: TSH 6.023 05/12/2020: Magnesium 2.0 06/03/2020: Hemoglobin 13.9; Platelets 315 06/18/2020: ALT 23; BUN 17; Creatinine, Ser 1.11; Potassium 4.3; Sodium 133   Lipid Panel Lipid Panel     Component Value Date/Time   CHOL 157 06/03/2020 1144   TRIG 265 (H) 06/03/2020 1144   HDL 30 (L) 06/03/2020 1144   CHOLHDL 5.2 (H) 06/03/2020 1144   CHOLHDL 5.9 05/11/2020 1438   VLDL 33 05/11/2020 1438   LDLCALC 83 06/03/2020 1144   LABVLDL 44 (H) 06/03/2020 1144     Wt Readings from Last 3 Encounters:  07/19/20 213 lb (96.6 kg)  06/03/20 213 lb (96.6 kg)  05/25/20 227 lb 12.8 oz (103.3 kg)     Other studies Reviewed: Additional studies/ records that were reviewed today include: . Review of the above records demonstrates:    Assessment and Plan:   1. CAD s/p CABG without angina: No chest pain. Continue ASA, beta blocker and statin   2. PAD: Followed in [facility].   3. Tobacco abuse: He has stopped smoking  4. Hyperlipidemia: Lipids followed in primary care. LDL near goal. Continue statin.   5. Chronic systolic CHF/Ischemic cardiomyopathy: LVEF 30-35% by echo June 2021. Will repeat limited echo in mid September 2021 and if LVEF is below 35%, will need an ICD. Continue beta blocker, Ace-inhibitor, aldactone and Lasix.   6. LV thrombus: Continue Eliquis  7. Atrial fibrillation:  Sinus today. Continue amiodarone for one more month.  Continue Eliquis  Current medicines are reviewed at length with the patient today.  The patient does not have concerns regarding medicines.  The following changes have been made:  no change  Labs/ tests ordered today include:   No orders of the defined types were placed in this encounter.   Disposition:   FU with me in 3  months  Signed, [name], MD 07/19/2020 2:25 PM    [facility] [address] [place], [place], [place]  [address] Phone: [phone]; Fax: [phone]

## 2020-07-19 NOTE — Patient Instructions (Signed)
Medication Instructions:  Your provider recommends that you continue on your current medications as directed. Please refer to the Current Medication list given to you today.   *If you need a refill on your cardiac medications before your next appointment, please call your pharmacy*  Follow-Up: Please keep your testing as scheduled!  Your provider recommends that you schedule a follow-up appointment in 3 months with Dr. [name].

## 2020-08-05 ENCOUNTER — Other Ambulatory Visit: Payer: Self-pay | Admitting: Physician Assistant

## 2020-08-16 ENCOUNTER — Other Ambulatory Visit: Payer: Self-pay

## 2020-08-16 DIAGNOSIS — E782 Mixed hyperlipidemia: Secondary | ICD-10-CM

## 2020-08-16 DIAGNOSIS — I2510 Atherosclerotic heart disease of native coronary artery without angina pectoris: Secondary | ICD-10-CM

## 2020-08-17 ENCOUNTER — Other Ambulatory Visit (HOSPITAL_COMMUNITY): Payer: Medicaid Other

## 2020-08-17 ENCOUNTER — Other Ambulatory Visit: Payer: Medicaid Other

## 2020-08-31 ENCOUNTER — Other Ambulatory Visit: Payer: Self-pay | Admitting: Physician Assistant

## 2020-08-31 NOTE — Telephone Encounter (Signed)
Pt last saw Dr [name] 07/19/20, last labs 06/18/20 Creat 1.11, age 54, weight 96.6kg, based on specified criteria pt is on appropriate dosage of Eliquis 5mg  BID.  Will refill rx.

## 2020-09-01 ENCOUNTER — Other Ambulatory Visit (HOSPITAL_COMMUNITY): Payer: Medicaid Other

## 2020-09-01 ENCOUNTER — Encounter: Payer: Self-pay | Admitting: Cardiology

## 2020-09-01 ENCOUNTER — Encounter (HOSPITAL_COMMUNITY): Payer: Self-pay | Admitting: Physician Assistant

## 2020-09-01 NOTE — Progress Notes (Unsigned)
Patient ID: Ryan [name], male   DOB: 05/20/1966, 54 y.o.   MRN: [id number]   Verified appointment "no show" status with Morrie Sheldon at 12:56.

## 2020-09-14 ENCOUNTER — Ambulatory Visit (HOSPITAL_COMMUNITY): Payer: Medicaid Other | Attending: Cardiology

## 2020-09-14 ENCOUNTER — Other Ambulatory Visit: Payer: Self-pay

## 2020-09-14 DIAGNOSIS — I513 Intracardiac thrombosis, not elsewhere classified: Secondary | ICD-10-CM | POA: Insufficient documentation

## 2020-09-14 DIAGNOSIS — I5023 Acute on chronic systolic (congestive) heart failure: Secondary | ICD-10-CM | POA: Diagnosis not present

## 2020-09-14 LAB — ECHOCARDIOGRAM LIMITED
Area-P 1/2: 3.7 cm2
P 1/2 time: 353 msec
S' Lateral: 3.5 cm

## 2020-09-14 MED ORDER — PERFLUTREN LIPID MICROSPHERE
1.0000 mL | INTRAVENOUS | Status: AC | PRN
  Administered 2020-09-14: 1 mL via INTRAVENOUS

## 2020-09-15 ENCOUNTER — Encounter: Payer: Self-pay | Admitting: Physician Assistant

## 2020-09-21 ENCOUNTER — Telehealth: Payer: Self-pay | Admitting: Physician Assistant

## 2020-09-21 NOTE — Telephone Encounter (Signed)
I received a notice from his insurance that he may be able to save money getting Rosuvastatin 90 days at a time. If he would like to change, please convert Rx to dispense 90 days at a time with 1 year of refills. Tereso Newcomer, PA-C    09/21/2020 9:06 PM

## 2020-09-22 NOTE — Telephone Encounter (Signed)
Rosuvastatin filled for #[phone] with 3 refills on 05/25/20.

## 2020-11-01 ENCOUNTER — Ambulatory Visit: Payer: Medicaid Other | Admitting: Cardiovascular Disease

## 2020-11-01 NOTE — Progress Notes (Deleted)
No chief complaint on file.  History of Present Illness: 54 yo male with history of ongoing tobacco abuse, CAD, HLD, PAD, HLD, ischemic cardiomyopathy and insulin dependent diabetes mellitus who is here today for cardiac follow up. He had an anterior STEMI in January 2013 with cardiac arrest and found to have an occluded mid LAD treated with a drug eluting stent. The third OM branch was chronically occluded and the RCA had mild disease. His post MI LVEF was 35% but this was found to be normal on follow up echo. Cardiac cath in November 2017 with severe stent restenosis in the LAD, treated with a cutting balloon angioplasty. He did not keep his f/u appt in our office following his PCI. He has been non-compliant with follow up, medications over the past few years and continues to smoke. Admitted to [facility] in August 2018 with an anterior STEMI. His LAD was occluded proximal to the old stent and this was treated with a drug eluting stent. Angioplasty performed on the ostial Diagonal branch. LVEF was 40% following the MI. He had unstable angina in June 2020 and repeat cardiac cath showed severe restenosis in the mid LAD stented segment which was treated with cutting balloon angioplasty. He also had ostial stenosis of the diagonal branch (jailed by LAD stents), mild to moderate non-obstructive disease in the mid RCA and moderate LV systolic dysfunction with anteroapical hypokinesis (c/w findings on echo in 2018 at [facility]). LVEF=35-45%. He was placed on DAPT w/ ASA and Brilinta and continued on statin, ? blocker, ACEi and long acting nitrate. I saw him in May 2021 and he c/o chest pain c/w unstable angina. Cardiac cath 04/22/20 with progression of disease in the LAD and RCA. He underwent 3V CABG 05/25/20. (LIMA to LAD, SVG to Diagonal and SVG to RCA). His post-operative course was complicated by atrial fibrillation and he was started on amiodarone. Echo with LVEF=30-35% with apical thrombus.  He was discharged on Eliquis with a Lifevest. Echo 05/11/20 with LVEF=30-35%. Echo October 2021 with LVEF=50-55% with no evidence of LV apical thrombus. Continued apical wall motion abnormality. He was advised to continue Eliquis given the chance of LV thrombus with the wall motion abnormality.   His PAD is followed in [facility] by Dr. Myra Gianotti. In 2013 he was found to have occlusion of his distal aorta and left subclavian artery. He underwent aortobifemoral bypass in February 2014 and left carotid to subclavian bypass in May 2014.  In April2019,he underwent right femoral to AK popliteal bypass with saphenous vein and right femoral endarterectomycope.   He is here today for follow up. The patient denies any chest pain, dyspnea, palpitations, lower extremity edema, orthopnea, PND, dizziness, near syncope or syncope.     Primary Care Physician: Fleet Contras, MD  Past Medical History:  Diagnosis Date  . Acute myocardial infarction of other anterior wall, subsequent episode of care 12/12/2011  . Anal fissure   . Arthritis    back  . Atherosclerosis of native artery of both lower extremities with intermittent claudication (HCC) 04/01/2018  . Bruises easily    d/t being on Effient  . CAD (coronary artery disease)    A. Acute Ant STEMI 12/12/2011   . Cardiomyopathy secondary 12/26/2011  . Chronic systolic CHF (congestive heart failure) (HCC)    ischemic CM // Echo 6/21: EF 30-35  //  Echocardiogram 10/21: LV apical thrombus resolved, EF 50-55, ant and apical, ant-lat HK, trivial MR, trivial AI  . Chronic  total occlusion of artery of the extremities (HCC) 02/26/2012  . Claudication (HCC) 12/26/2011  . Coronary atherosclerosis of native coronary artery    a. ant STEMI with cardiac arrest 2013 s/p DES to mLAD. b. [place] 10/2016 s/p PTCA to mLAD.  . Diabetes mellitus without complication (HCC)   . Essential hypertension 10/23/2016  . GERD (gastroesophageal reflux disease)    "takes tums"  . History of blood  transfusion    no abnormal reaction noted  . History of kidney stones   . Hypertension   . Ischemic cardiomyopathy    a. EF 35% in 02/2012 at time of acute MI, improved to normal on subsequent imaging.  . MI (myocardial infarction) (HCC)    AMI 1/13 - complicated by VT/Tosades  . Mixed hyperlipidemia   . Numbness and tingling of right arm 03/31/2013  . Occlusion and stenosis of carotid artery without mention of cerebral infarction 08/26/2012  . PAD (peripheral artery disease) (HCC)    followed by [facility] - aortobifem bypass 01/2013 & left carotid-subclavian artery bypass 04/2013  . Peripheral vascular disease, unspecified (HCC) 12/16/2012  . PVD (peripheral vascular disease) (HCC) 03/31/2013  . Rectal polyp   . Subclavian steal syndrome 08/11/2013  . Uncontrolled diabetes mellitus (HCC) 10/23/2016  . Wears partial dentures    upper    Past Surgical History:  Procedure Laterality Date  . ABDOMINAL AORTOGRAM W/LOWER EXTREMITY N/A 06/26/2017   Procedure: Abdominal Aortogram w/Lower Extremity;  Surgeon: Nada Libman, MD;  [facility]: [facility] INVASIVE [facility] LAB;  Service: Cardiovascular;  Laterality: N/A;  . ANAL FISSURECTOMY    . AORTA - BILATERAL FEMORAL ARTERY BYPASS GRAFT N/A 01/16/2013   Procedure: AORTA BIFEMORAL BYPASS GRAFT;  Surgeon: Nada Libman, MD;  Location: [facility] OR;  Service: Vascular;  Laterality: N/A;  . APPENDECTOMY    . CARDIAC CATHETERIZATION N/A 10/23/2016   Procedure: Left Heart Cath and Coronary Angiography;  Surgeon: Kathleene Hazel, MD;  Location: [facility] INVASIVE CV LAB;  Service: Cardiovascular;  Laterality: N/A;  . CARDIAC CATHETERIZATION N/A 10/23/2016   Procedure: Coronary Balloon Angioplasty;  Surgeon: Kathleene Hazel, MD;  Location: [facility]VAS[facility] CV LAB;  Service: Cardiovascular;  Laterality: N/A;  . CAROTID-SUBCLAVIAN BYPASS GRAFT Left 04/03/2013   Procedure: BYPASS GRAFT CAROTID-SUBCLAVIAN;  Surgeon: Nada Libman, MD;  Location: [facility] OR;  Service: Vascular;   Laterality: Left;  . CIRCUMCISION N/A 12/15/2015   Procedure: CIRCUMCISION ADULT;  Surgeon: Malen Gauze, MD;  Location: [facility] ORS;  Service: Urology;  Laterality: N/A;  . COLONOSCOPY    . CORONARY ANGIOPLASTY     stent placed Dec 12, 2011 and 2018  . CORONARY ARTERY BYPASS GRAFT N/A 05/05/2020   Procedure: CORONARY ARTERY BYPASS GRAFTING (CABG) x Three, Using Left internal Mammary Artery and Left Leg greater saphenous vein harvested endoscopically;  Surgeon: Delight Ovens, MD;  Location: [facility] OR;  Service: Open Heart Surgery;  Laterality: N/A;  . CORONARY BALLOON ANGIOPLASTY N/A 05/23/2019   Procedure: CORONARY BALLOON ANGIOPLASTY;  Surgeon: Kathleene Hazel, MD;  [facility]: [facility];  Service: Cardiovascular;  Laterality: N/A;  . DIAGNOSTIC LAPAROSCOPY    . ENDARTERECTOMY FEMORAL Right 03/13/2018   Procedure: FEMORAL ENDARTERECTOMY RIGHT;  Surgeon: Nada Libman, MD;  Location: [facility] OR;  Service: Vascular;  Laterality: Right;  . FEMORAL-POPLITEAL BYPASS GRAFT Right 03/13/2018   Procedure: FEMORAL-POPLITEAL ARTERY BYPASS WITH NON-REVERSE VEIN RIGHT;  Surgeon: Nada Libman, MD;  Location: [facility] OR;  Service: Vascular;  Laterality: Right;  .  GROIN DISSECTION Right 03/13/2018   Procedure: RE-DO COMMON FEMORAL ARTERY EXPOSURE;  Surgeon: Nada Libman, MD;  Location: [facility] OR;  Service: Vascular;  Laterality: Right;  . I & D EXTREMITY Left 04/18/2013   Procedure: debridement of left neck lymphocele;  Surgeon: Nada Libman, MD;  Location: [facility] OR;  Service: Vascular;  Laterality: Left;  I and D of left neck  . I & D EXTREMITY Right 11/21/2014   Procedure: IRRIGATION AND DEBRIDEMENT RIGHT HAND;  Surgeon: Dominica Severin, MD;  Location: [facility] OR;  Service: Orthopedics;  Laterality: Right;  . INTRAVASCULAR PRESSURE WIRE/FFR STUDY N/A 04/22/2020   Procedure: INTRAVASCULAR PRESSURE WIRE/FFR STUDY;  Surgeon: Kathleene Hazel, MD;  [facility]: [facility];  Service: Cardiovascular;   Laterality: N/A;  . LEFT HEART CATH AND CORONARY ANGIOGRAPHY N/A 05/23/2019   Procedure: LEFT HEART CATH AND CORONARY ANGIOGRAPHY;  Surgeon: Kathleene Hazel, MD;  [facility]: [facility];  Service: Cardiovascular;  Laterality: N/A;  . LEFT HEART CATH AND CORONARY ANGIOGRAPHY N/A 04/22/2020   Procedure: LEFT HEART CATH AND CORONARY ANGIOGRAPHY;  Surgeon: Kathleene Hazel, MD;  [facility]: [facility];  Service: Cardiovascular;  Laterality: N/A;  . LEFT HEART CATHETERIZATION WITH CORONARY ANGIOGRAM N/A 12/12/2011   Procedure: LEFT HEART CATHETERIZATION WITH CORONARY ANGIOGRAM;  Surgeon: Kathleene Hazel, MD;  Location: [facility] LAB;  Service: Cardiovascular;  Laterality: N/A;  . LOWER EXTREMITY ANGIOGRAM N/A 01/31/2012   Procedure: LOWER EXTREMITY ANGIOGRAM;  Surgeon: Kathleene Hazel, MD;  Location: [facility] CATH LAB;  Service: Cardiovascular;  Laterality: N/A;  . LOWER EXTREMITY ANGIOGRAPHY N/A 04/23/2018   Procedure: LOWER EXTREMITY ANGIOGRAPHY;  Surgeon: Nada Libman, MD;  [facility];  Service: Cardiovascular;  Laterality: N/A;  . PERCUTANEOUS CORONARY STENT INTERVENTION (PCI-S) Right 12/12/2011   Procedure: PERCUTANEOUS CORONARY STENT INTERVENTION (PCI-S);  Surgeon: Kathleene Hazel, MD;  Location: [facility] CATH LAB;  Service: Cardiovascular;  Laterality: Right;  . PERIPHERAL VASCULAR INTERVENTION Left 04/23/2018   Procedure: PERIPHERAL VASCULAR INTERVENTION;  Surgeon: Nada Libman, MD;  [facility]: [facility]AS[facility];  Service: Cardiovascular;  Laterality: Left;  superficial femoral  . REPAIR EXTENSOR TENDON Right 11/21/2014   Procedure: WITH REPAIR/RECONSTRUCTION OF EXTENSOR TENDONS AS NEEDED;  Surgeon: Dominica Severin, MD;  Location: [facility] OR;  Service: Orthopedics;  Laterality: Right;  . TEE WITHOUT CARDIOVERSION N/A 05/05/2020   Procedure: TRANSESOPHAGEAL ECHOCARDIOGRAM (TEE);  Surgeon: Delight Ovens, MD;  Location: [facility] OR;  Service: Open Heart  Surgery;  Laterality: N/A;    Current Outpatient Medications  Medication Sig Dispense Refill  . amiodarone (PACERONE) 200 MG tablet TAKE 1 TABLET BY MOUTH EVERY DAY AS DIRECTED 90 tablet 3  . aspirin EC 81 MG tablet Take 1 tablet (81 mg total) by mouth daily. 90 tablet 3  . carvedilol (COREG) 12.5 MG tablet Take 1 tablet (12.5 mg total) by mouth 2 (two) times daily with a meal. 60 tablet 3  . cyclobenzaprine (FLEXERIL) 10 MG tablet Take 1 tablet (10 mg total) by mouth 2 (two) times daily as needed for muscle spasms. 30 tablet 0  . ELIQUIS 5 MG TABS tablet TAKE 1 TABLET BY MOUTH 2 TIMES A DAY 60 tablet 6  . enalapril (VASOTEC) 5 MG tablet Take 1 tablet (5 mg total) by mouth daily. 60 tablet 2  . furosemide (LASIX) 40 MG tablet Take 1.5 tablets (60 mg total) by mouth daily. 135 tablet 3  . gabapentin (NEURONTIN) 300 MG capsule  Take 300 mg by mouth 3 (three) times daily.    . Insulin Glargine (LANTUS SOLOSTAR) 100 UNIT/ML Solostar Pen Inject 80 Units into the skin at bedtime.     . insulin lispro (HUMALOG) 100 UNIT/ML injection Inject into the skin 3 (three) times daily before meals. 10-15 units sliding scale prn    . nitroGLYCERIN (NITROSTAT) 0.4 MG SL tablet Place 1 tablet (0.4 mg total) under the tongue every 5 (five) minutes as needed for chest pain. 25 tablet 3  . oxyCODONE-acetaminophen (PERCOCET) 10-325 MG tablet Take 1 tablet by mouth every 4 (four) hours as needed for pain.    . pantoprazole (PROTONIX) 40 MG tablet Take 40 mg by mouth daily.    . potassium chloride (KLOR-CON) 10 MEQ tablet Take 1 tablet (10 mEq total) by mouth daily. 30 tablet 6  . rosuvastatin (CRESTOR) 40 MG tablet Take 1 tablet (40 mg total) by mouth daily. 90 tablet 3  . spironolactone (ALDACTONE) 25 MG tablet Take 0.5 tablets (12.5 mg total) by mouth daily. 15 tablet 6  . traZODone (DESYREL) 100 MG tablet Take 100 mg by mouth at bedtime as needed for sleep.    . Vitamin D, Ergocalciferol, (DRISDOL) 1.25 MG (50000  UNIT) CAPS capsule Take 50,000 Units by mouth once a week.     No current facility-administered medications for this visit.    Allergies  Allergen Reactions  . Hydrocodone Hives and Nausea And Vomiting    Social History   Socioeconomic History  . Marital status: Divorced    Spouse name: Not on file  . Number of children: Not on file  . Years of education: Not on file  . Highest education level: Not on file  Occupational History    Employer: OTHER  Tobacco Use  . Smoking status: Current Every Day Smoker    Packs/day: 0.25    Years: 25.00    Pack years: 6.25    Types: Cigarettes  . Smokeless tobacco: Never Used  . Tobacco comment: 1-2 cigs a day  Vaping Use  . Vaping Use: Never used  Substance and Sexual Activity  . Alcohol use: Yes    Alcohol/week: 4.0 standard drinks    Types: 4 Cans of beer per week  . Drug use: No  . Sexual activity: Yes  Other Topics Concern  . Not on file  Social History Narrative   Disabled - no longer works   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 Strain:   . Difficulty of Paying Living Expenses: Not on file  Food Insecurity:   . Worried About Programme researcher, broadcasting/film/video in the Last Year: Not on file  . Ran Out of Food in the Last Year: Not on file  Transportation Needs:   . Lack of Transportation (Medical): Not on file  . Lack of Transportation (Non-Medical): Not on file  Physical Activity:   . Days of Exercise per Week: Not on file  . Minutes of Exercise per Session: Not on file  Stress:   . Feeling of Stress : Not on file  Social Connections:   . Frequency of Communication with Friends and Family: Not on file  . Frequency of Social Gatherings with Friends and Family: Not on file  . Attends Religious Services: Not on file  . Active Member of Clubs or Organizations: Not on file  . Attends Banker Meetings: Not on file  . Marital Status: Not on file  Intimate Partner Violence:   . Fear of  Current or Ex-Partner:  Not on file  . Emotionally Abused: Not on file  . Physically Abused: Not on file  . Sexually Abused: Not on file    Family History  Problem Relation Age of Onset  . Cancer Father        Lung  . Diabetes Mother   . COPD Mother   . Cancer Maternal Uncle        Colon    Review of Systems:  As stated in the HPI and otherwise negative.   There were no vitals taken for this visit.  Physical Examination:  General: Well developed, well nourished, NAD  HEENT: OP clear, mucus membranes moist  SKIN: warm, dry. No rashes. Neuro: No focal deficits  Musculoskeletal: Muscle strength 5/5 all ext  Psychiatric: Mood and affect normal  Neck: No JVD, no carotid bruits, no thyromegaly, no lymphadenopathy.  Lungs:Clear bilaterally, no wheezes, rhonci, crackles Cardiovascular: Regular rate and rhythm. No murmurs, gallops or rubs. Abdomen:Soft. Bowel sounds present. Non-tender.  Extremities: No lower extremity edema. Pulses are 2 + in the bilateral DP/PT.  EKG:  EKG is *** ordered today. The ekg ordered today demonstrates   Echo October 2021:  1. Echo contrast no longer demonstrates apical LV thrombus, resolved  compared to prior study. Left ventricular ejection fraction, by  estimation, is 50 to 55%. The left ventricle has low normal function. The  left ventricle demonstrates regional wall  motion abnormalities (see scoring diagram/findings for description). There  is severe hypokinesis of the left ventricular, mid-apical anterior wall  and apical segment. There is moderate hypokinesis of the left ventricular,  mid-apical anterolateral wall.  2. The mitral valve is normal in structure. Trivial mitral valve  regurgitation.  3. The aortic valve is tricuspid. There is mild calcification of the  aortic valve. There is mild thickening of the aortic valve. Aortic valve  regurgitation is trivial. No aortic stenosis is present.  4. There is normal pulmonary artery systolic pressure.    Recent Labs: 05/07/2020: B Natriuretic Peptide 481.7 05/11/2020: TSH 6.023 05/12/2020: Magnesium 2.0 06/03/2020: Hemoglobin 13.9; Platelets 315 06/18/2020: ALT 23; BUN 17; Creatinine, Ser 1.11; Potassium 4.3; Sodium 133   Lipid Panel Lipid Panel     Component Value Date/Time   CHOL 157 06/03/2020 1144   TRIG 265 (H) 06/03/2020 1144   HDL 30 (L) 06/03/2020 1144   CHOLHDL 5.2 (H) 06/03/2020 1144   CHOLHDL 5.9 05/11/2020 1438   VLDL 33 05/11/2020 1438   LDLCALC 83 06/03/2020 1144   LABVLDL 44 (H) 06/03/2020 1144     Wt Readings from Last 3 Encounters:  07/19/20 213 lb (96.6 kg)  06/03/20 213 lb (96.6 kg)  05/25/20 227 lb 12.8 oz (103.3 kg)     Other studies Reviewed: Additional studies/ records that were reviewed today include: . Review of the above records demonstrates:    Assessment and Plan:   1. CAD s/p CABG without angina: He has no chest pain. Will continue ASA, statin and beta blocker.   2. PAD: Followed in [facility].   3. Tobacco abuse: He has stopped smoking  4. Hyperlipidemia: Lipids followed in primary care. LDL near goal.. Will continue statin.    5. Chronic systolic CHF/Ischemic cardiomyopathy: LVEF 50-55% by echo October 2021. Will continue beta blocker, Ace-inh, aldactone. Weight stable. Continue Lasix.    6. LV thrombus: No evidence of LV apical thrombus on echo October 2021 but will continue Eliquis given the chance of LV thrombus formation with the ongoing apical  wall motion abnormality.   7. Atrial fibrillation, paroxysmal: Sinus today. *** Will stop amiodarone. Continue Eliquis.   Current medicines are reviewed at length with the patient today.  The patient does not have concerns regarding medicines.  The following changes have been made:  no change  Labs/ tests ordered today include:   No orders of the defined types were placed in this encounter.   Disposition:   FU with me in 3  months  Signed, [name], MD 11/01/2020 6:33 AM    [facility] [address] [place], [place], [place]  [address] Phone: [phone]; Fax: [phone]

## 2020-11-24 ENCOUNTER — Other Ambulatory Visit: Payer: Self-pay | Admitting: Physician Assistant

## 2020-12-20 ENCOUNTER — Ambulatory Visit: Payer: Medicaid Other | Admitting: Physician Assistant

## 2021-02-10 NOTE — Progress Notes (Deleted)
Cardiology Office Note:    Date:  02/10/2021   ID:  Ryan [name], DOB 22-Jun-1966, MRN [id number]  PCP:  Fleet Contras, MD   [facility]  Cardiologist:  [name], MD *** Advanced Practice Provider:  No care team member to display Electrophysiologist:  None  {Press F2 to show EP APP, CHF, sleep or structural heart MD               :[id number]}  { Click here to update then REFRESH NOTE - MD (PCP) or APP (Team Member)  Change PCP Type for MD, Specialty for APP is either Cardiology or Clinical Cardiac Electrophysiology  :[id number]}   Referring MD: Fleet Contras, MD   Chief Complaint:  No chief complaint on file.    Patient Profile:    Ryan [name] is a 55 y.o. male with:   Coronary artery disease ? S/p anterior STEMI in 1/13 c/b cardiac arrest >> DES to LAD; OM1 w 100 CTO ? Cath 11/17: LAD severe ISR >> cutting balloon POBA ? S/p anterior STEMI 8/18 [facility]) >> PCI: DES to LAD prox to old stent, POBA to ost Dx ? Cath 05/2019: mid LAD severe ISR s/p cutting balloon POBA ? Cath 05/2020: severe LAD restenosis >> s/p CABG  Peripheral Arterial Disease (Dr. Myra Gianotti)  ? Distal aorta occlusion and L subclavian artery s/p aorto-bifem bypass in 2014 ? S/p L carotid to subclavian bypass in 5/14 ? R fem to AK popliteal bypass and R fem endarterectomy   HFimpEF (heart failure with improved ejection fraction); Ischemic CM ? Post MI EF 35; FU echocardiogram with normal EF        ? EF 8/18: 40 ? EF 05/2019: 35-45 ? Echocardiogram 05/2020: EF 30-35; DC on LifeVest ? Echocardiogram 10/21: EF 50-55  LV apical thrombus ? Noted on echocardiogram post CABG 05/2020 >> Apixaban  Post op AFib ? Amiodarone   Hypertension   Hyperlipidemia   Diabetes mellitus, insulin dependent   Tobacco abuse   Prior CV studies: Echocardiogram 09/14/20 No LV apical clot, EF 50-55, ant and apical and ant-lat HK, trivial MR, trivial AI  Echocardiogram 05/11/2020 EF 30-35,  apical, apical ant, apical inf AK, lat and ant-sept and ant HK, mildly reduced RVSF, trivial AI, small LV apical thrombus  Echocardiogram 05/04/2020 EF 40-45, prior LAD infarct, Gr 3 DD, trivial MR  Carotid US 05/04/2020 Bilat ICA 1-39  Cardiac catheterization 04/22/2020 LAD prox 80 ISR; D1 80 OM3 100 CTO RCA mid 60 EF 25-35  History of Present Illness:    Ryan [name] was last seen by Dr. [name] in 8/21.  F/u echocardiogram in 10/21 demonstrated improved LVF and no residual LV apical clot.  He returns for f/u.  ***      Past Medical History:  Diagnosis Date  . Acute myocardial infarction of other anterior wall, subsequent episode of care 12/12/2011  . Anal fissure   . Arthritis    back  . Atherosclerosis of native artery of both lower extremities with intermittent claudication (HCC) 04/01/2018  . Bruises easily    d/t being on Effient  . CAD (coronary artery disease)    A. Acute Ant STEMI 12/12/2011   . Cardiomyopathy secondary 12/26/2011  . Chronic systolic CHF (congestive heart failure) (HCC)    ischemic CM // Echo 6/21: EF 30-35  //  Echocardiogram 10/21: LV apical thrombus resolved, EF 50-55, ant and apical, ant-lat HK, trivial MR, trivial AI  . Chronic total occlusion of artery of the  extremities (HCC) 02/26/2012  . Claudication (HCC) 12/26/2011  . Coronary atherosclerosis of native coronary artery    a. ant STEMI with cardiac arrest 2013 s/p DES to mLAD. b. [place] 10/2016 s/p PTCA to mLAD.  . Diabetes mellitus without complication (HCC)   . Essential hypertension 10/23/2016  . GERD (gastroesophageal reflux disease)    "takes tums"  . History of blood transfusion    no abnormal reaction noted  . History of kidney stones   . Hypertension   . Ischemic cardiomyopathy    a. EF 35% in 02/2012 at time of acute MI, improved to normal on subsequent imaging.  . MI (myocardial infarction) (HCC)    AMI 1/13 - complicated by VT/Tosades  . Mixed hyperlipidemia   . Numbness and tingling of  right arm 03/31/2013  . Occlusion and stenosis of carotid artery without mention of cerebral infarction 08/26/2012  . PAD (peripheral artery disease) (HCC)    followed by Vascular - aortobifem bypass 01/2013 & left carotid-subclavian artery bypass 04/2013  . Peripheral vascular disease, unspecified (HCC) 12/16/2012  . PVD (peripheral vascular disease) (HCC) 03/31/2013  . Rectal polyp   . Subclavian steal syndrome 08/11/2013  . Uncontrolled diabetes mellitus (HCC) 10/23/2016  . Wears partial dentures    upper    Current Medications: No outpatient medications have been marked as taking for the 02/11/21 encounter (Appointment) with Tereso Newcomer T, PA-C.     Allergies:   Hydrocodone   Social History   Tobacco Use  . Smoking status: Current Every Day Smoker    Packs/day: 0.25    Years: 25.00    Pack years: 6.25    Types: Cigarettes  . Smokeless tobacco: Never Used  . Tobacco comment: 1-2 cigs a day  Vaping Use  . Vaping Use: Never used  Substance Use Topics  . Alcohol use: Yes    Alcohol/week: 4.0 standard drinks    Types: 4 Cans of beer per week  . Drug use: No     Family Hx: The patient's family history includes COPD in his mother; Cancer in his father and maternal uncle; Diabetes in his mother.  ROS   EKGs/Labs/Other Test Reviewed:    EKG:  EKG is *** ordered today.  The ekg ordered today demonstrates ***  Recent Labs: 05/07/2020: B Natriuretic Peptide 481.7 05/11/2020: TSH 6.023 05/12/2020: Magnesium 2.0 06/03/2020: Hemoglobin 13.9; Platelets 315 06/18/2020: ALT 23; BUN 17; Creatinine, Ser 1.11; Potassium 4.3; Sodium 133   Recent Lipid Panel Lab Results  Component Value Date/Time   CHOL 157 06/03/2020 11:44 AM   TRIG 265 (H) 06/03/2020 11:44 AM   HDL 30 (L) 06/03/2020 11:44 AM   CHOLHDL 5.2 (H) 06/03/2020 11:44 AM   CHOLHDL 5.9 05/11/2020 02:38 PM   LDLCALC 83 06/03/2020 11:44 AM      Risk Assessment/Calculations:   {Does this patient have ATRIAL  FIBRILLATION?:[id number]}  Physical Exam:    VS:  There were no vitals taken for this visit.    Wt Readings from Last 3 Encounters:  07/19/20 213 lb (96.6 kg)  06/03/20 213 lb (96.6 kg)  05/25/20 227 lb 12.8 oz (103.3 kg)     Physical Exam ***  ASSESSMENT & PLAN:    ***  1. Coronary artery disease involving native coronary artery of native heart without angina pectoris History of anterior myocardial infarction in May 2013 treated the drug-eluting stent to the LAD.  He has had multiple PCI procedures since.  He is now status post CABG 05/05/2020.  He seems to be progressing well since discharge from the hospital.  I have encouraged him to pursue cardiac rehabilitation.  He has been referred to the hospital at [facility].  He has follow-up with Dr. Tyrone Sage in the next few weeks.  Continue aspirin, beta-blocker, ACE inhibitor, statin therapy.  2. Acute on chronic systolic CHF (congestive heart failure) (HCC) Ischemic cardiomyopathy.  NYHA II.  EF 30-35 by echocardiogram during recent hospitalization.  He continues to appear to be volume overloaded.  I have suggested that we increase his furosemide to 60 mg daily.  Continue potassium 20 mEq daily.  Obtain BMET today and BMET in 1 week.  Continue current dose of carvedilol, enalapril.  Consider adding low-dose spironolactone.  Continue LifeVest.  I will arrange a follow-up limited echo 90 days post bypass surgery.  If his EF remains <35%, he will need referral to EP for ICD implantation.  3. Postoperative atrial fibrillation (HCC) Maintaining sinus rhythm.  He remains on amiodarone.  He is also on Apixaban due to LV apical thrombus.  Continue amiodarone for the next 2 to 3 months and then consider discontinuation.  4. NSVT (nonsustained ventricular tachycardia) (HCC) He was noted to have nonsustained ventricular tachycardia in the hospital.  Continue to keep potassium above 4 and magnesium above 2.  Lab work will be obtained today and  repeated in 1 week.  If potassium is less than 4, consider adding spironolactone 12.5 mg daily.  5. Left ventricular apical thrombus Continue anticoagulation with Apixaban.  Repeat echo will be obtained 90 days post bypass surgery.  Obtain CBC with follow-up BMET in 1 week.  6. Mixed hyperlipidemia He is on high-dose simvastatin therapy.  Potential for myopathy at this dose with amiodarone is elevated.  Therefore, discontinue simvastatin.  Start rosuvastatin 40 mg daily.  Obtain lipids and LFTs in 12 weeks.  If his LDL remains >70, consider adding ezetimibe or refer to lipid clinic for possible PCSK9 inhibitor therapy. {Are you ordering a CV Procedure (e.g. stress test, cath, DCCV, TEE, etc)?   Press F2        :[id number]}    Dispo:  No follow-ups on file.   Medication Adjustments/Labs and Tests Ordered: Current medicines are reviewed at length with the patient today.  Concerns regarding medicines are outlined above.  Tests Ordered: No orders of the defined types were placed in this encounter.  Medication Changes: No orders of the defined types were placed in this encounter.   Signed, Tereso Newcomer, PA-C  02/10/2021 9:02 PM    [facility] [address] [place], [place], [place]  [address] Phone: [phone]; Fax: [phone]

## 2021-02-11 ENCOUNTER — Ambulatory Visit: Payer: Medicaid Other | Admitting: Physician Assistant

## 2021-02-11 DIAGNOSIS — I5020 Unspecified systolic (congestive) heart failure: Secondary | ICD-10-CM

## 2021-02-11 DIAGNOSIS — E782 Mixed hyperlipidemia: Secondary | ICD-10-CM

## 2021-02-11 DIAGNOSIS — I10 Essential (primary) hypertension: Secondary | ICD-10-CM

## 2021-02-11 DIAGNOSIS — I4891 Unspecified atrial fibrillation: Secondary | ICD-10-CM

## 2021-02-11 DIAGNOSIS — I2510 Atherosclerotic heart disease of native coronary artery without angina pectoris: Secondary | ICD-10-CM

## 2021-02-23 ENCOUNTER — Other Ambulatory Visit: Payer: Self-pay | Admitting: Cardiovascular Disease

## 2021-02-23 NOTE — Telephone Encounter (Signed)
19m, 96.6kg, scr 1.11 06/18/20, lovw/mcalhany 07/19/20

## 2021-04-10 NOTE — Progress Notes (Addendum)
Cardiology Office Note:    Date:  04/11/2021   ID:  Ryan [name], DOB 1966-05-22, MRN [id number]  PCP:  Nolene Ebbs, MD   [facility] Providers Cardiologist:  [name], MD     Referring MD: Nolene Ebbs, MD   Chief Complaint:  Follow-up (CAD)    Patient Profile:    Ryan [name] is a 55 y.o. male with:   Coronary artery disease ? S/p anterior STEMI in 1/13 c/b cardiac arrest >> DES to LAD; OM1 w 100 CTO ? Cath 11/17: LAD severe ISR >> cutting balloon POBA ? S/p anterior STEMI 8/18 [facility]) >> PCI: DES to LAD prox to old stent, POBA to ost Dx ? Cath 05/2019: mid LAD severe ISR >> PCI:  Cutting balloon POBA ? Cath 05/2020: severe LAD restenosis >> s/p CABG  Peripheral Arterial Disease (Dr. Trula Slade)  ? Distal aorta occlusion and L subclavian artery s/p aorto-bifem bypass in 2014 ? S/p L carotid to subclavian bypass in 5/14 ? R fem to AK popliteal bypass and R fem endarterectomy   Hyperlipidemia   HFimpEF (heart failure with improved ejection fraction); Ischemic CM ? Post MI EF 35; FU echocardiogram with normal EF        ? EF 8/18: 40 ? EF 05/2019: 35-45 ? Echocardiogram 05/2020: EF 30-35 ? Echocardiogram 10/21: EF 50-55  LV apical thrombus ? Noted on echocardiogram post CABG 05/2020 >> Apixaban  Post op AFib ? Amiodarone   Diabetes mellitus, insulin dependent   Tobacco abuse   Prior CV studies: Echocardiogram 09/14/20 1. Echo contrast no longer demonstrates apical LV thrombus, resolved  compared to prior study. Left ventricular ejection fraction, by  estimation, is 50 to 55%. The left ventricle has low normal function. The  left ventricle demonstrates regional wall  motion abnormalities (see scoring diagram/findings for description). There  is severe hypokinesis of the left ventricular, mid-apical anterior wall  and apical segment. There is moderate hypokinesis of the left ventricular,  mid-apical anterolateral wall.  2. The mitral valve is  normal in structure. Trivial mitral valve  regurgitation.  3. The aortic valve is tricuspid. There is mild calcification of the  aortic valve. There is mild thickening of the aortic valve. Aortic valve  regurgitation is trivial. No aortic stenosis is present.  4. There is normal pulmonary artery systolic pressure.   Echocardiogram 05/11/2020 EF 30-35, apical, apical ant, apical inf AK, lat and ant-sept and ant HK, mildly reduced RVSF, trivial AI, small LV apical thrombus  Echocardiogram 05/04/2020 EF 40-45, prior LAD infarct, Gr 3 DD, trivial MR  Carotid US 05/04/2020 Bilat ICA 1-39  Cardiac catheterization 04/22/2020 LAD prox 80 ISR; D1 80 OM3 100 CTO RCA mid 60 EF 25-35   History of Present Illness: Mr. Ryan [name] was last seen by Dr. Angelena Form in 8/21.  A f/u echocardiogram in 10/21 demonstrated improved LVF with low normal ejection fraction.  He remains on long term anticoagulation.  Of note, Dr. Angelena Form recommended 1 more month of Amiodarone at his last visit in 8/21.  He returns for f/u.  He is here alone.  He has been doing well without chest discomfort, significant shortness of breath, orthopnea, leg edema or syncope.  He does get lightheaded from time to time when he stands quickly.    Past Medical History:  Diagnosis Date  . Acute myocardial infarction of other anterior wall, subsequent episode of care 12/12/2011  . Anal fissure   . Arthritis    back  . Atherosclerosis of native  artery of both lower extremities with intermittent claudication ([place]) 04/01/2018  . Bruises easily    d/t being on Effient  . CAD (coronary artery disease)    A. Acute Ant STEMI 12/12/2011   . Cardiomyopathy secondary 12/26/2011  . Chronic systolic CHF (congestive heart failure) (HCC)    ischemic CM // Echo 6/21: EF 30-35  //  Echocardiogram 10/21: LV apical thrombus resolved, EF 50-55, ant and apical, ant-lat HK, trivial MR, trivial AI  . Chronic total occlusion of artery of the extremities ([place])  02/26/2012  . Claudication ([place]) 12/26/2011  . Coronary atherosclerosis of native coronary artery    a. ant STEMI with cardiac arrest 2013 s/p DES to mLAD. b. [place] 10/2016 s/p PTCA to mLAD.  . Diabetes mellitus without complication ([place])   . Essential hypertension 10/23/2016  . GERD (gastroesophageal reflux disease)    "takes tums"  . History of blood transfusion    no abnormal reaction noted  . History of kidney stones   . Hypertension   . Ischemic cardiomyopathy    a. EF 35% in 02/2012 at time of acute MI, improved to normal on subsequent imaging.  . MI (myocardial infarction) ([place])    AMI 8/36 - complicated by VT/Tosades  . Mixed hyperlipidemia   . Numbness and tingling of right arm 03/31/2013  . Occlusion and stenosis of carotid artery without mention of cerebral infarction 08/26/2012  . PAD (peripheral artery disease) ([place])    followed by Vascular - aortobifem bypass 01/2013 & left carotid-subclavian artery bypass 04/2013  . Peripheral vascular disease, unspecified ([place]) 12/16/2012  . PVD (peripheral vascular disease) ([place]) 03/31/2013  . Rectal polyp   . Subclavian steal syndrome 08/11/2013  . Uncontrolled diabetes mellitus ([place]) 10/23/2016  . Wears partial dentures    upper    Current Medications: Current Meds  Medication Sig  . aspirin EC 81 MG tablet Take 1 tablet (81 mg total) by mouth daily.  . carvedilol (COREG) 12.5 MG tablet Take 1 tablet (12.5 mg total) by mouth 2 (two) times daily with a meal.  . cyclobenzaprine (FLEXERIL) 10 MG tablet Take 1 tablet (10 mg total) by mouth 2 (two) times daily as needed for muscle spasms.  . diazepam (VALIUM) 5 MG tablet Take 5 mg by mouth daily as needed.  Marland Kitchen ELIQUIS 5 MG TABS tablet TAKE 1 TABLET BY MOUTH 2 TIMES A DAY  . enalapril (VASOTEC) 5 MG tablet Take 1 tablet (5 mg total) by mouth daily.  Marland Kitchen FARXIGA 10 MG TABS tablet Take 10 mg by mouth daily.  . furosemide (LASIX) 40 MG tablet Take 1 tablet (40 mg total) by mouth daily.  Marland Kitchen  gabapentin (NEURONTIN) 300 MG capsule Take 300 mg by mouth 3 (three) times daily.  . insulin glargine (LANTUS) 100 UNIT/ML Solostar Pen Inject 80 Units into the skin at bedtime.   . insulin lispro (HUMALOG) 100 UNIT/ML injection Inject into the skin 3 (three) times daily before meals. 10-15 units sliding scale prn  . nitroGLYCERIN (NITROSTAT) 0.4 MG SL tablet Place under the tongue.  Marland Kitchen oxyCODONE-acetaminophen (PERCOCET) 10-325 MG tablet Take 1 tablet by mouth every 4 (four) hours as needed for pain.  . pantoprazole (PROTONIX) 40 MG tablet Take 40 mg by mouth daily.  . potassium chloride (KLOR-CON) 10 MEQ tablet Take 1 tablet (10 mEq total) by mouth daily.  . rosuvastatin (CRESTOR) 40 MG tablet Take 1 tablet (40 mg total) by mouth daily.  Marland Kitchen spironolactone (ALDACTONE) 25 MG tablet TAKE 1/2  TABLET BY MOUTH EVERY DAY  . traZODone (DESYREL) 100 MG tablet Take 100 mg by mouth at bedtime as needed for sleep.  . Vitamin D, Ergocalciferol, (DRISDOL) 1.25 MG (50000 UNIT) CAPS capsule Take 50,000 Units by mouth once a week.  . [DISCONTINUED] amiodarone (PACERONE) 200 MG tablet TAKE 1 TABLET BY MOUTH EVERY DAY AS DIRECTED  . [DISCONTINUED] furosemide (LASIX) 40 MG tablet Take 1.5 tablets (60 mg total) by mouth daily.  . [DISCONTINUED] simvastatin (ZOCOR) 80 MG tablet Take 80 mg by mouth at bedtime.     Allergies:   Hydrocodone and Metformin   Social History   Tobacco Use  . Smoking status: Former Smoker    Packs/day: 0.25    Years: 25.00    Pack years: 6.25    Types: Cigarettes  . Smokeless tobacco: Never Used  . Tobacco comment: 1-2 cigs a day  Vaping Use  . Vaping Use: Never used  Substance Use Topics  . Alcohol use: Yes    Alcohol/week: 4.0 standard drinks    Types: 4 Cans of beer per week  . Drug use: No     Family Hx: The patient's family history includes COPD in his mother; Cancer in his father and maternal uncle; Diabetes in his mother.  Review of Systems  Cardiovascular: Positive  for claudication (R calf).  Gastrointestinal: Negative for hematochezia and melena.  Genitourinary: Negative for hematuria.     EKGs/Labs/Other Test Reviewed:    EKG:  EKG is  ordered today.  The ekg ordered today demonstrates NSR, HR 77, normal axis, nonspecific ST-T wave changes, anteroseptal Q waves, QTC 452, similar to prior tracings  Recent Labs: 05/07/2020: B Natriuretic Peptide 481.7 05/11/2020: TSH 6.023 05/12/2020: Magnesium 2.0 06/03/2020: Hemoglobin 13.9; Platelets 315 06/18/2020: ALT 23; BUN 17; Creatinine, Ser 1.11; Potassium 4.3; Sodium 133   Recent Lipid Panel Lab Results  Component Value Date/Time   CHOL 157 06/03/2020 11:44 AM   TRIG 265 (H) 06/03/2020 11:44 AM   HDL 30 (L) 06/03/2020 11:44 AM   CHOLHDL 5.2 (H) 06/03/2020 11:44 AM   CHOLHDL 5.9 05/11/2020 02:38 PM   LDLCALC 83 06/03/2020 11:44 AM      Risk Assessment/Calculations:    CHA2DS2-VASc Score = 4  This indicates a 4.8% annual risk of stroke. The patient's score is based upon: CHF History: Yes HTN History: Yes Diabetes History: Yes Stroke History: No Vascular Disease History: Yes Age Score: 0 Gender Score: 0     Physical Exam:    VS:  BP 96/60   Pulse 77   Ht 5' 9"  (1.753 m)   Wt 231 lb 3.2 oz (104.9 kg)   SpO2 90%   BMI 34.14 kg/m     Wt Readings from Last 3 Encounters:  04/11/21 231 lb 3.2 oz (104.9 kg)  07/19/20 213 lb (96.6 kg)  06/03/20 213 lb (96.6 kg)     Constitutional:      Appearance: Healthy appearance. Not in distress.  Neck:     Vascular: JVD normal.  Pulmonary:     Effort: Pulmonary effort is normal.     Breath sounds: No wheezing. No rales.  Cardiovascular:     Normal rate. Regular rhythm. Normal S1. Normal S2.     Murmurs: There is no murmur.  Edema:    Peripheral edema absent.  Abdominal:     Palpations: Abdomen is soft. There is no hepatomegaly.  Skin:    General: Skin is warm and dry.  Neurological:  General: No focal deficit present.     Mental  Status: Alert and oriented to person, place and time.     Cranial Nerves: Cranial nerves are intact.        ASSESSMENT & PLAN:    1. Coronary artery disease involving native coronary artery of native heart without angina pectoris History of anterior myocardial infarction in May 2013 treated the drug-eluting stent to the LAD.  He has had multiple PCI procedures since.  He is now status post CABG 05/05/2020.    He is doing well without anginal symptoms.  Continue aspirin, statin, beta-blocker.  Follow-up in 6 months.  2. HFimpEF (heart failure with improved EF) Ischemic cardiomyopathy.  NYHA II.  EF was 30-35.  Echocardiogram in 10/21 with improved LVF with normal EF.  He is doing well without significant dyspnea or lower extremity swelling.  He does get lightheaded from time to time.  I have asked him to cut back on his furosemide to 40 mg daily.  Continue current dose of carvedilol, enalapril, spironolactone, dapagliflozin.  3. PAD (peripheral artery disease) ([place]) History of aortobifemoral bypass in 2014.  He notes some right calf claudication and has not seen vascular surgery in quite some time.  I have asked him to call vascular surgery to arrange earlier follow-up.  Continue aspirin, statin.  4. Essential hypertension Blood pressure running somewhat low.  Decrease furosemide as noted.  Continue current dose of carvedilol, enalapril, spironolactone.  5. PAF (paroxysmal atrial fibrillation) ([place]) He is maintaining sinus rhythm.  He is almost 1 year out since his surgery.  We can discontinue his amiodarone now.  Continue Apixaban.  Arrange follow-up c-Met, CBC.  6. Left ventricular apical thrombus Resolved on most recent echocardiogram.  Continue long-term anticoagulation with Apixaban.  7. Mixed hyperlipidemia He has simvastatin 80 mg and rosuvastatin 40 mg on his medication list.  Max dose of simvastatin with amiodarone is 20 mg.  I have asked him to make sure that he is not taking  simvastatin.  He should remain on rosuvastatin alone.  I will arrange follow-up fasting c-Met, lipids.  If LDL remains >70.  Consider adding ezetimibe.     Dispo:  Return in about 6 months (around 10/12/2021) for Routine Follow Up w/ Dr. Angelena Form.   Medication Adjustments/Labs and Tests Ordered: Current medicines are reviewed at length with the patient today.  Concerns regarding medicines are outlined above.  Tests Ordered: Orders Placed This Encounter  Procedures  . Comp Met (CMET)  . CBC  . Lipid Profile  . EKG 12-Lead   Medication Changes: Meds ordered this encounter  Medications  . furosemide (LASIX) 40 MG tablet    Sig: Take 1 tablet (40 mg total) by mouth daily.    Dispense:  90 tablet    Refill:  3    Signed, Richardson Dopp, PA-C  04/11/2021 4:47 PM    [facility] [place], [place], [place]  [address] Phone: [phone]; Fax: [phone]

## 2021-04-11 ENCOUNTER — Ambulatory Visit (INDEPENDENT_AMBULATORY_CARE_PROVIDER_SITE_OTHER): Payer: Medicaid Other | Admitting: Physician Assistant

## 2021-04-11 ENCOUNTER — Other Ambulatory Visit: Payer: Self-pay

## 2021-04-11 ENCOUNTER — Encounter: Payer: Self-pay | Admitting: Physician Assistant

## 2021-04-11 VITALS — BP 96/60 | HR 77 | Ht 69.0 in | Wt 231.2 lb

## 2021-04-11 DIAGNOSIS — I480 Paroxysmal atrial fibrillation: Secondary | ICD-10-CM

## 2021-04-11 DIAGNOSIS — I10 Essential (primary) hypertension: Secondary | ICD-10-CM

## 2021-04-11 DIAGNOSIS — E782 Mixed hyperlipidemia: Secondary | ICD-10-CM

## 2021-04-11 DIAGNOSIS — I739 Peripheral vascular disease, unspecified: Secondary | ICD-10-CM | POA: Diagnosis not present

## 2021-04-11 DIAGNOSIS — I513 Intracardiac thrombosis, not elsewhere classified: Secondary | ICD-10-CM

## 2021-04-11 DIAGNOSIS — I5032 Chronic diastolic (congestive) heart failure: Secondary | ICD-10-CM | POA: Diagnosis not present

## 2021-04-11 DIAGNOSIS — I2510 Atherosclerotic heart disease of native coronary artery without angina pectoris: Secondary | ICD-10-CM | POA: Diagnosis not present

## 2021-04-11 MED ORDER — FUROSEMIDE 40 MG PO TABS: 40.0000 mg | ORAL_TABLET | Freq: Every day | ORAL | 3 refills | Status: DC

## 2021-04-11 NOTE — Patient Instructions (Signed)
Medication Instructions:  Your physician has recommended you make the following change in your medication:  BE SURE YOU ARE TAKING ROSUVASTATIN DO NOT TAKE SIMVASTATIN  DECREASE YOUR LASIX/ FUROSEMIDE TO 40 MG A DAY  STOP AMIODARONE  *If you need a refill on your cardiac medications before your next appointment, please call your pharmacy*   Lab Work: CMET, FASTING LIPIDS, CBC IN 4 WEEKS  If you have labs (blood work) drawn today and your tests are completely normal, you will receive your results only by: Marland Kitchen MyChart Message (if you have MyChart) OR . A paper copy in the mail If you have any lab test that is abnormal or we need to change your treatment, we will call you to review the results.   Testing/Procedures: NONE   Follow-Up: At [facility], you and your health needs are our priority.  As part of our continuing mission to provide you with exceptional heart care, we have created designated Provider Care Teams.  These Care Teams include your primary Cardiologist (physician) and Advanced Practice Providers (APPs -  Physician Assistants and Nurse Practitioners) who all work together to provide you with the care you need, when you need it.  We recommend signing up for the patient portal called "MyChart".  Sign up information is provided on this After Visit Summary.  MyChart is used to connect with patients for Virtual Visits (Telemedicine).  Patients are able to view lab/test results, encounter notes, upcoming appointments, etc.  Non-urgent messages can be sent to your provider as well.   To learn more about what you can do with MyChart, go to [email or web address].    Your next appointment:   6 month(s)  The format for your next appointment:   In Person  Provider:   [name], MD   Other Instructions

## 2021-05-06 ENCOUNTER — Other Ambulatory Visit: Payer: Self-pay | Admitting: Physician Assistant

## 2021-05-10 ENCOUNTER — Other Ambulatory Visit: Payer: Self-pay

## 2021-05-10 ENCOUNTER — Telehealth: Payer: Self-pay

## 2021-05-10 DIAGNOSIS — I739 Peripheral vascular disease, unspecified: Secondary | ICD-10-CM

## 2021-05-10 DIAGNOSIS — G458 Other transient cerebral ischemic attacks and related syndromes: Secondary | ICD-10-CM

## 2021-05-10 NOTE — Telephone Encounter (Signed)
Patient is s/p right bypass and right femoral endarterectomy in 2019. He says that over the last couple of months his right leg has begun hurting again. He can walk about 30 feet before it starts aching. He denies wounds, color or temperature changes. Placed patient on schedule for follow up with RLE bypass duplex, ABI and a bilateral carotid scan that was needed in 6 months per 2019 note.

## 2021-06-01 ENCOUNTER — Other Ambulatory Visit: Payer: Self-pay | Admitting: Physician Assistant

## 2021-06-08 NOTE — Progress Notes (Signed)
HISTORY AND PHYSICAL     CC:  follow up. Requesting Provider:  Fleet Contras, MD  HPI: This is a 55 y.o. male who is here today for follow up and is pt of Dr. Myra Gianotti and has hx of ABF bypass in 2014.  He developed subclavian steal with left arm claudication and had a left carotid subclavian bypass 04/03/2013 and had to return to the operating room for drainage of lymphocele.  Pt also has hx of claudication and had balloon angioplasty of the right CFA but did not have significant improvement in his sx.  On 03/13/2018, he underwent right femoral to above knee popliteal bypass with GSV for claudication by Dr. Myra Gianotti.   Pt has hx of MI in 2013 with hx of drug eluting stent to LAD.  He has hx of coronary angioplasty in 2017.  He did have CABG on 05/05/2020. He has hx of PAF and is on Apixaban. He also has hx of LV apical thrombus that has since resolved. He is to continue Apixaban per cardiology.    Pt was last seen on 07/18/2018.  At that time, his claudication sx were resolved and he did not have any wounds.  He was to follow up in 3 months but that did not happen.   He was seen by cardiology 04/11/2021 and at that time, he was not having any chest pain, SOB, leg edema orthopnea or syncope.    The pt returns today for follow up.  He states that he has been having increasing leg pain on the left with walking.  He states that it is the same pain he was having before his bypass.  He states he can walk about 30 feet before he has to stop.  He does not have any rest pain or non healing wounds.  He does not have any issues with the left leg.    Pt denies any amaurosis fugax, speech difficulties, weakness, numbness, paralysis or clumsiness or facial droop.    The pt is on a statin for cholesterol management.    The pt is on an aspirin.    Other AC:  Eliquis The pt is on BB, ACEI for hypertension.  The pt does have diabetes. Tobacco hx:  former    Past Medical History:  Diagnosis Date   Acute  myocardial infarction of other anterior wall, subsequent episode of care 12/12/2011   Anal fissure    Arthritis    back   Atherosclerosis of native artery of both lower extremities with intermittent claudication (HCC) 04/01/2018   Bruises easily    d/t being on Effient   CAD (coronary artery disease)    A. Acute Ant STEMI 12/12/2011    Cardiomyopathy secondary 12/26/2011   Chronic systolic CHF (congestive heart failure) (HCC)    ischemic CM // Echo 6/21: EF 30-35  //  Echocardiogram 10/21: LV apical thrombus resolved, EF 50-55, ant and apical, ant-lat HK, trivial MR, trivial AI   Chronic total occlusion of artery of the extremities (HCC) 02/26/2012   Claudication (HCC) 12/26/2011   Coronary atherosclerosis of native coronary artery    a. ant STEMI with cardiac arrest 2013 s/p DES to mLAD. b. [place] 10/2016 s/p PTCA to mLAD.   Diabetes mellitus without complication (HCC)    Essential hypertension 10/23/2016   GERD (gastroesophageal reflux disease)    "takes tums"   History of blood transfusion    no abnormal reaction noted   History of kidney stones    Hypertension  Ischemic cardiomyopathy    a. EF 35% in 02/2012 at time of acute MI, improved to normal on subsequent imaging.   MI (myocardial infarction) (HCC)    AMI 1/13 - complicated by VT/Tosades   Mixed hyperlipidemia    Numbness and tingling of right arm 03/31/2013   Occlusion and stenosis of carotid artery without mention of cerebral infarction 08/26/2012   PAD (peripheral artery disease) (HCC)    followed by Vascular - aortobifem bypass 01/2013 & left carotid-subclavian artery bypass 04/2013   Peripheral vascular disease, unspecified (HCC) 12/16/2012   PVD (peripheral vascular disease) (HCC) 03/31/2013   Rectal polyp    Subclavian steal syndrome 08/11/2013   Uncontrolled diabetes mellitus (HCC) 10/23/2016   Wears partial dentures    upper    Past Surgical History:  Procedure Laterality Date   ABDOMINAL AORTOGRAM W/LOWER EXTREMITY N/A  06/26/2017   Procedure: Abdominal Aortogram w/Lower Extremity;  Surgeon: Nada LibmanBrabham, Vance W, MD;  [facility]: [facility];  Service: Cardiovascular;  Laterality: N/A;   ANAL FISSURECTOMY     AORTA - BILATERAL FEMORAL ARTERY BYPASS GRAFT N/A 01/16/2013   Procedure: AORTA BIFEMORAL BYPASS GRAFT;  Surgeon: Nada LibmanVance W Brabham, MD;  Location: [facility];  Service: Vascular;  Laterality: N/A;   APPENDECTOMY     CARDIAC CATHETERIZATION N/A 10/23/2016   Procedure: Left Heart Cath and Coronary Angiography;  Surgeon: Kathleene Hazelhristopher D McAlhany, MD;  [facility];  Service: Cardiovascular;  Laterality: N/A;   CARDIAC CATHETERIZATION N/A 10/23/2016   Procedure: Coronary Balloon Angioplasty;  Surgeon: Kathleene Hazelhristopher D McAlhany, MD;  [facility];  Service: Cardiovascular;  Laterality: N/A;   CAROTID-SUBCLAVIAN BYPASS GRAFT Left 04/03/2013   Procedure: BYPASS GRAFT CAROTID-SUBCLAVIAN;  Surgeon: Nada LibmanVance W Brabham, MD;  [facility];  Service: Vascular;  Laterality: Left;   CIRCUMCISION N/A 12/15/2015   Procedure: CIRCUMCISION ADULT;  Surgeon: Malen GauzePatrick L McKenzie, MD;  Location: [facility];  Service: Urology;  Laterality: N/A;   COLONOSCOPY     CORONARY ANGIOPLASTY     stent placed Dec 12, 2011 and 2018   CORONARY ARTERY BYPASS GRAFT N/A 05/05/2020   Procedure: CORONARY ARTERY BYPASS GRAFTING (CABG) x Three, Using Left internal Mammary Artery and Left Leg greater saphenous vein harvested endoscopically;  Surgeon: Delight OvensGerhardt, Edward B, MD;  [facility];  Service: Open Heart Surgery;  Laterality: N/A;   CORONARY BALLOON ANGIOPLASTY N/A 05/23/2019   Procedure: CORONARY BALLOON ANGIOPLASTY;  Surgeon: Kathleene HazelMcAlhany, Christopher D, MD;  [facility];  Service: Cardiovascular;  Laterality: N/A;   DIAGNOSTIC LAPAROSCOPY     ENDARTERECTOMY FEMORAL Right 03/13/2018   Procedure: FEMORAL ENDARTERECTOMY RIGHT;  Surgeon: Nada LibmanBrabham, Vance W, MD;  [facility];  Service: Vascular;  Laterality: Right;    FEMORAL-POPLITEAL BYPASS GRAFT Right 03/13/2018   Procedure: FEMORAL-POPLITEAL ARTERY BYPASS WITH NON-REVERSE VEIN RIGHT;  Surgeon: Nada LibmanBrabham, Vance W, MD;  [facility];  Service: Vascular;  Laterality: Right;   GROIN DISSECTION Right 03/13/2018   Procedure: RE-DO COMMON FEMORAL ARTERY EXPOSURE;  Surgeon: Nada LibmanBrabham, Vance W, MD;  [facility];  Service: Vascular;  Laterality: Right;   I & D EXTREMITY Left 04/18/2013   Procedure: debridement of left neck lymphocele;  Surgeon: Nada LibmanVance W Brabham, MD;  [facility];  Service: Vascular;  Laterality: Left;  I and D of left neck   I & D EXTREMITY Right 11/21/2014   Procedure: IRRIGATION AND DEBRIDEMENT RIGHT HAND;  Surgeon: Dominica SeverinWilliam Gramig, MD;  [facility];  Service: Orthopedics;  Laterality: Right;   INTRAVASCULAR PRESSURE WIRE/FFR STUDY N/A 04/22/2020   Procedure: INTRAVASCULAR PRESSURE WIRE/FFR STUDY;  Surgeon: Kathleene Hazel, MD;  [facility];  Service: Cardiovascular;  Laterality: N/A;   LEFT HEART CATH AND CORONARY ANGIOGRAPHY N/A 05/23/2019   Procedure: LEFT HEART CATH AND CORONARY ANGIOGRAPHY;  Surgeon: Kathleene Hazel, MD;  [facility];  Service: Cardiovascular;  Laterality: N/A;   LEFT HEART CATH AND CORONARY ANGIOGRAPHY N/A 04/22/2020   Procedure: LEFT HEART CATH AND CORONARY ANGIOGRAPHY;  Surgeon: Kathleene Hazel, MD;  [facility];  Service: Cardiovascular;  Laterality: N/A;   LEFT HEART CATHETERIZATION WITH CORONARY ANGIOGRAM N/A 12/12/2011   Procedure: LEFT HEART CATHETERIZATION WITH CORONARY ANGIOGRAM;  Surgeon: Kathleene Hazel, MD;  Location: [facility] LAB;  Service: Cardiovascular;  Laterality: N/A;   LOWER EXTREMITY ANGIOGRAM N/A 01/31/2012   Procedure: LOWER EXTREMITY ANGIOGRAM;  Surgeon: Kathleene Hazel, MD;  Location: [facility] C[facility] LAB;  Service: Cardiovascular;  Laterality: N/A;   LOWER EXTREMITY ANGIOGRAPHY N/A 04/23/2018   Procedure: LOWER EXTREMITY  ANGIOGRAPHY;  Surgeon: Nada Libman, MD;  [facility];  Service: Cardiovascular;  Laterality: N/A;   PERCUTANEOUS CORONARY STENT INTERVENTION (PCI-S) Right 12/12/2011   Procedure: PERCUTANEOUS CORONARY STENT INTERVENTION (PCI-S);  Surgeon: Kathleene Hazel, MD;  Location: [facility] CATH LAB;  Service: Cardiovascular;  Laterality: Right;   PERIPHERAL VASCULAR INTERVENTION Left 04/23/2018   Procedure: PERIPHERAL VASCULAR INTERVENTION;  Surgeon: Nada Libman, MD;  [facility];  Service: Cardiovascular;  Laterality: Left;  superficial femoral   REPAIR EXTENSOR TENDON Right 11/21/2014   Procedure: WITH REPAIR/RECONSTRUCTION OF EXTENSOR TENDONS AS NEEDED;  Surgeon: Dominica Severin, MD;  [facility];  Service: Orthopedics;  Laterality: Right;   TEE WITHOUT CARDIOVERSION N/A 05/05/2020   Procedure: TRANSESOPHAGEAL ECHOCARDIOGRAM (TEE);  Surgeon: Delight Ovens, MD;  [facility] OR;  Service: Open Heart Surgery;  Laterality: N/A;    Allergies  Allergen Reactions   Hydrocodone Hives and Nausea And Vomiting   Metformin     Other reaction(s): Other (See Comments) Water blisters between fingers    Current Outpatient Medications  Medication Sig Dispense Refill   aspirin EC 81 MG tablet Take 1 tablet (81 mg total) by mouth daily. 90 tablet 3   carvedilol (COREG) 12.5 MG tablet Take 1 tablet (12.5 mg total) by mouth 2 (two) times daily with a meal. 60 tablet 3   cyclobenzaprine (FLEXERIL) 10 MG tablet Take 1 tablet (10 mg total) by mouth 2 (two) times daily as needed for muscle spasms. 30 tablet 0   diazepam (VALIUM) 5 MG tablet Take 5 mg by mouth daily as needed.     ELIQUIS 5 MG TABS tablet TAKE 1 TABLET BY MOUTH 2 TIMES A DAY 180 tablet 1   enalapril (VASOTEC) 5 MG tablet Take 1 tablet (5 mg total) by mouth daily. 60 tablet 2   FARXIGA 10 MG TABS tablet Take 10 mg by mouth daily.     furosemide (LASIX) 40 MG tablet Take 1 tablet (40 mg total) by mouth daily.  90 tablet 3   gabapentin (NEURONTIN) 300 MG capsule Take 300 mg by mouth 3 (three) times daily.     insulin glargine (LANTUS) 100 UNIT/ML Solostar Pen Inject 80 Units into the skin at bedtime.      insulin lispro (HUMALOG) 100 UNIT/ML injection Inject into the skin 3 (three) times daily before meals.  10-15 units sliding scale prn     nitroGLYCERIN (NITROSTAT) 0.4 MG SL tablet Place under the tongue.     oxyCODONE-acetaminophen (PERCOCET) 10-325 MG tablet Take 1 tablet by mouth every 4 (four) hours as needed for pain.     pantoprazole (PROTONIX) 40 MG tablet Take 40 mg by mouth daily.     potassium chloride (KLOR-CON) 10 MEQ tablet Take 1 tablet (10 mEq total) by mouth daily. 30 tablet 6   rosuvastatin (CRESTOR) 40 MG tablet Take 1 tablet (40 mg total) by mouth daily. 90 tablet 3   spironolactone (ALDACTONE) 25 MG tablet TAKE 1/2 TABLET BY MOUTH EVERY DAY 45 tablet 2   traZODone (DESYREL) 100 MG tablet Take 100 mg by mouth at bedtime as needed for sleep.     Vitamin D, Ergocalciferol, (DRISDOL) 1.25 MG (50000 UNIT) CAPS capsule Take 50,000 Units by mouth once a week.     No current facility-administered medications for this visit.    Family History  Problem Relation Age of Onset   Cancer Father        Lung   Diabetes Mother    COPD Mother    Cancer Maternal Uncle        Colon    Social History   Socioeconomic History   Marital status: Divorced    Spouse name: Not on file   Number of children: Not on file   Years of education: Not on file   Highest education level: Not on file  Occupational History    Employer: OTHER  Tobacco Use   Smoking status: Former    Packs/day: 0.25    Years: 25.00    Pack years: 6.25    Types: Cigarettes   Smokeless tobacco: Never   Tobacco comments:    1-2 cigs a day  Vaping Use   Vaping Use: Never used  Substance and Sexual Activity   Alcohol use: Yes    Alcohol/week: 4.0 standard drinks    Types: 4 Cans of beer per week   Drug use: No    Sexual activity: Yes  Other Topics Concern   Not on file  Social History Narrative   Disabled - no longer works   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 Strain: Not on file  Food Insecurity: Not on file  Transportation Needs: Not on file  Physical Activity: Not on file  Stress: Not on file  Social Connections: Not on file  Intimate Partner Violence: Not on file     REVIEW OF SYSTEMS:   [X]  denotes positive finding, [ ]  denotes negative finding Cardiac  Comments:  Chest pain or chest pressure:    Shortness of breath upon exertion:    Short of breath when lying flat:    Irregular heart rhythm:        Vascular    Pain in calf, thigh, or hip brought on by ambulation: x   Pain in feet at night that wakes you up from your sleep:     Blood clot in your veins:    Leg swelling:         Pulmonary    Oxygen at home:    Wheezing:         Neurologic    Sudden weakness in arms or legs:     Sudden numbness in arms or legs:     Sudden onset of difficulty speaking or understanding others    Temporary loss of vision in one eye:  Problems with dizziness:         Gastrointestinal    Blood in stool:     Vomited blood:         Genitourinary    Burning when urinating:     Blood in urine:        Psychiatric    Major depression:         Hematologic    Bleeding problems:    Problems with blood clotting too easily:        Skin    Rashes or ulcers:        Constitutional    Fever or chills:      PHYSICAL EXAMINATION:  Today's Vitals   06/13/21 1423 06/13/21 1426  BP: 125/86 114/78  Pulse: 96   Resp: 20   Temp: 98 F (36.7 C)   SpO2: 97%   Weight: 230 lb (104.3 kg)   Height: 5\' 9"  (1.753 m)    Body mass index is 33.97 kg/m.   General:  WDWN in NAD; vital signs documented above Gait: Not observed HENT: WNL, normocephalic Pulmonary: normal non-labored breathing  Cardiac: regular HR;  with carotid bruit on the left Abdomen: soft, NT, aortic pulse  is not palpable Skin: without rashes Vascular Exam/Pulses:  Right Left  Radial 2+ (normal) 2+ (normal)  Femoral 2+ (normal) 2+ (normal)  Popliteal Unable to palpate Unable to palpate  DP Faint monophasic doppler Monophasic doppler  PT Brisk monophasic doppler Brisk monophasic doppler  Peroneal Faint monophasic doppler Faint monophasic doppler   Extremities: without ischemic changes, without Gangrene , without cellulitis; without open wounds;  Musculoskeletal: no muscle wasting or atrophy  Neurologic: A&O X 3;  speech is fluent/normal; moving all extremities equally  Psychiatric:  The pt has Normal affect.   Non-Invasive Vascular Imaging:   ABI's/TBI's on 06/13/2021: Right:  0.91/0.76 - great toe pressure:  89 Left:  1.03/1.0 - great toe pressure:  117   Arterial duplex on 06/13/2021: +-----------+--------+-----+--------+----------+--------+  RIGHT      PSV cm/sRatioStenosisWaveform  Comments  +-----------+--------+-----+--------+----------+--------+  ATA Distal 11                   monophasic          +-----------+--------+-----+--------+----------+--------+  PTA Distal 47                   monophasic          +-----------+--------+-----+--------+----------+--------+  PERO Distal7                    monophasic          +-----------+--------+-----+--------+----------+--------+        Right Graft #1:  +------------------+--------+-------------+----------+--------------------+                     PSV cm/sStenosis     Waveform  Comments                +------------------+--------+-------------+----------+--------------------+   Inflow            80                   monophasic                        +------------------+--------+-------------+----------+--------------------+   Prox Anastomosis  432     >70% stenosis          Rt limb of  ao-bi-fem  +------------------+--------+-------------+----------+--------------------+    Proximal Graft  36                   monophasic                        +------------------+--------+-------------+----------+--------------------+   Mid Graft         42                   monophasic                        +------------------+--------+-------------+----------+--------------------+   Distal Graft      48                   monophasic                        +------------------+--------+-------------+----------+--------------------+   Distal Anastomosis35                   monophasic                        +------------------+--------+-------------+----------+--------------------+   Outflow           54                   monophasic                        +------------------+--------+-------------+----------+--------------------+  Non-Invasive Vascular Imaging:   Carotid Duplex on 06/13/2021: Right:  1-39% ICA stenosis Left:  1-39% ICA stenosis  Previous ABI on 05/04/2020: Right:  0.95 (T) Left:  1.05 (T)   Previous Carotid duplex on 05/04/2020: Right: 1-39% ICA stenosis Left:   1-39% ICA stenosis    ASSESSMENT/PLAN:: 55 y.o. male here for follow up for PAD with hx of ABF bypass in 2014.  He developed subclavian steal with left arm claudication and had a left carotid subclavian bypass 04/03/2013 and had to return to the operating room for drainage of lymphocele.  Pt also has hx of claudication and had balloon angioplasty of the right CFA but did not have significant improvement in his sx.  On 03/13/2018, he underwent right femoral to above knee popliteal bypass with GSV for claudication by Dr. Myra Gianotti.    PAD -pt with lifestyle limiting claudication of the RLE.  He currently does not have any rest pain or non healing wounds.  He has elevated velocities at the proximal anastomosis and decreased velocities distally. -will schedule him for arterigram to evaluate bloodflow.  Discussed with him that if he can intervene at that time he will,  but if not, he may need further surgical intervention.   -pt is on Eliquis for PAF and will need to be off of this a couple of days prior to procedure.   -continue graduated walking program   Hx of left carotid - subclavian bypass 04/03/2013 for subclavian steal -duplex today reveals 1-39% bilateral ICA stenosis and patent bypass. He has not had any stroke sx.   -discussed s/s of stroke/TIA and if pt develops any sx, they know to call 911 or go to the emergency room -pt will f/u in one year with duplex  -continue statin/asa   Doreatha Massed, [facility] [phone]  Clinic MD:   Chestine Spore on call MD

## 2021-06-08 NOTE — H&P (View-Only) (Signed)
HISTORY AND PHYSICAL     CC:  follow up. Requesting Provider:  Avbuere, Edwin, MD  HPI: This is a 55 y.o. male who is here today for follow up and is pt of Dr. Brabham and has hx of ABF bypass in 2014.  He developed subclavian steal with left arm claudication and had a left carotid subclavian bypass 04/03/2013 and had to return to the operating room for drainage of lymphocele.  Pt also has hx of claudication and had balloon angioplasty of the right CFA but did not have significant improvement in his sx.  On 03/13/2018, he underwent right femoral to above knee popliteal bypass with GSV for claudication by Dr. Brabham.   Pt has hx of MI in 2013 with hx of drug eluting stent to LAD.  He has hx of coronary angioplasty in 2017.  He did have CABG on 05/05/2020. He has hx of PAF and is on Apixaban. He also has hx of LV apical thrombus that has since resolved. He is to continue Apixaban per cardiology.    Pt was last seen on 07/18/2018.  At that time, his claudication sx were resolved and he did not have any wounds.  He was to follow up in 3 months but that did not happen.   He was seen by cardiology 04/11/2021 and at that time, he was not having any chest pain, SOB, leg edema orthopnea or syncope.    The pt returns today for follow up.  He states that he has been having increasing leg pain on the left with walking.  He states that it is the same pain he was having before his bypass.  He states he can walk about 30 feet before he has to stop.  He does not have any rest pain or non healing wounds.  He does not have any issues with the left leg.    Pt denies any amaurosis fugax, speech difficulties, weakness, numbness, paralysis or clumsiness or facial droop.    The pt is on a statin for cholesterol management.    The pt is on an aspirin.    Other AC:  Eliquis The pt is on BB, ACEI for hypertension.  The pt does have diabetes. Tobacco hx:  former    Past Medical History:  Diagnosis Date   Acute  myocardial infarction of other anterior wall, subsequent episode of care 12/12/2011   Anal fissure    Arthritis    back   Atherosclerosis of native artery of both lower extremities with intermittent claudication (HCC) 04/01/2018   Bruises easily    d/t being on Effient   CAD (coronary artery disease)    A. Acute Ant STEMI 12/12/2011    Cardiomyopathy secondary 12/26/2011   Chronic systolic CHF (congestive heart failure) (HCC)    ischemic CM // Echo 6/21: EF 30-35  //  Echocardiogram 10/21: LV apical thrombus resolved, EF 50-55, ant and apical, ant-lat HK, trivial MR, trivial AI   Chronic total occlusion of artery of the extremities (HCC) 02/26/2012   Claudication (HCC) 12/26/2011   Coronary atherosclerosis of native coronary artery    a. ant STEMI with cardiac arrest 2013 s/p DES to mLAD. b. [place] 10/2016 s/p PTCA to mLAD.   Diabetes mellitus without complication (HCC)    Essential hypertension 10/23/2016   GERD (gastroesophageal reflux disease)    "takes tums"   History of blood transfusion    no abnormal reaction noted   History of kidney stones    Hypertension      Ischemic cardiomyopathy    a. EF 35% in 02/2012 at time of acute MI, improved to normal on subsequent imaging.   MI (myocardial infarction) (HCC)    AMI 1/13 - complicated by VT/Tosades   Mixed hyperlipidemia    Numbness and tingling of right arm 03/31/2013   Occlusion and stenosis of carotid artery without mention of cerebral infarction 08/26/2012   PAD (peripheral artery disease) (HCC)    followed by Vascular - aortobifem bypass 01/2013 & left carotid-subclavian artery bypass 04/2013   Peripheral vascular disease, unspecified (HCC) 12/16/2012   PVD (peripheral vascular disease) (HCC) 03/31/2013   Rectal polyp    Subclavian steal syndrome 08/11/2013   Uncontrolled diabetes mellitus (HCC) 10/23/2016   Wears partial dentures    upper    Past Surgical History:  Procedure Laterality Date   ABDOMINAL AORTOGRAM W/LOWER EXTREMITY N/A  06/26/2017   Procedure: Abdominal Aortogram w/Lower Extremity;  Surgeon: Brabham, Vance W, MD;  [facility]: [facility];  Service: Cardiovascular;  Laterality: N/A;   ANAL FISSURECTOMY     AORTA - BILATERAL FEMORAL ARTERY BYPASS GRAFT N/A 01/16/2013   Procedure: AORTA BIFEMORAL BYPASS GRAFT;  Surgeon: Vance W Brabham, MD;  Location: [facility];  Service: Vascular;  Laterality: N/A;   APPENDECTOMY     CARDIAC CATHETERIZATION N/A 10/23/2016   Procedure: Left Heart Cath and Coronary Angiography;  Surgeon: Christopher D McAlhany, MD;  [facility]: [facility];  Service: Cardiovascular;  Laterality: N/A;   CARDIAC CATHETERIZATION N/A 10/23/2016   Procedure: Coronary Balloon Angioplasty;  Surgeon: Christopher D McAlhany, MD;  [facility];  Service: Cardiovascular;  Laterality: N/A;   CAROTID-SUBCLAVIAN BYPASS GRAFT Left 04/03/2013   Procedure: BYPASS GRAFT CAROTID-SUBCLAVIAN;  Surgeon: Vance W Brabham, MD;  Location: [facility];  Service: Vascular;  Laterality: Left;   CIRCUMCISION N/A 12/15/2015   Procedure: CIRCUMCISION ADULT;  Surgeon: Patrick L McKenzie, MD;  Location: [facility];  Service: Urology;  Laterality: N/A;   COLONOSCOPY     CORONARY ANGIOPLASTY     stent placed Dec 12, 2011 and 2018   CORONARY ARTERY BYPASS GRAFT N/A 05/05/2020   Procedure: CORONARY ARTERY BYPASS GRAFTING (CABG) x Three, Using Left internal Mammary Artery and Left Leg greater saphenous vein harvested endoscopically;  Surgeon: Gerhardt, Edward B, MD;  [facility]: [facility];  Service: Open Heart Surgery;  Laterality: N/A;   CORONARY BALLOON ANGIOPLASTY N/A 05/23/2019   Procedure: CORONARY BALLOON ANGIOPLASTY;  Surgeon: McAlhany, Christopher D, MD;  [facility];  Service: Cardiovascular;  Laterality: N/A;   DIAGNOSTIC LAPAROSCOPY     ENDARTERECTOMY FEMORAL Right 03/13/2018   Procedure: FEMORAL ENDARTERECTOMY RIGHT;  Surgeon: Brabham, Vance W, MD;  [facility];  Service: Vascular;  Laterality: Right;    FEMORAL-POPLITEAL BYPASS GRAFT Right 03/13/2018   Procedure: FEMORAL-POPLITEAL ARTERY BYPASS WITH NON-REVERSE VEIN RIGHT;  Surgeon: Brabham, Vance W, MD;  [facility];  Service: Vascular;  Laterality: Right;   GROIN DISSECTION Right 03/13/2018   Procedure: RE-DO COMMON FEMORAL ARTERY EXPOSURE;  Surgeon: Brabham, Vance W, MD;  [facility];  Service: Vascular;  Laterality: Right;   I & D EXTREMITY Left 04/18/2013   Procedure: debridement of left neck lymphocele;  Surgeon: Vance W Brabham, MD;  [facility];  Service: Vascular;  Laterality: Left;  I and D of left neck   I & D EXTREMITY Right 11/21/2014   Procedure: IRRIGATION AND DEBRIDEMENT RIGHT HAND;  Surgeon: William Gramig, MD;  [facility];    Service: Orthopedics;  Laterality: Right;   INTRAVASCULAR PRESSURE WIRE/FFR STUDY N/A 04/22/2020   Procedure: INTRAVASCULAR PRESSURE WIRE/FFR STUDY;  Surgeon: McAlhany, Christopher D, MD;  [facility];  Service: Cardiovascular;  Laterality: N/A;   LEFT HEART CATH AND CORONARY ANGIOGRAPHY N/A 05/23/2019   Procedure: LEFT HEART CATH AND CORONARY ANGIOGRAPHY;  Surgeon: McAlhany, Christopher D, MD;  [facility];  Service: Cardiovascular;  Laterality: N/A;   LEFT HEART CATH AND CORONARY ANGIOGRAPHY N/A 04/22/2020   Procedure: LEFT HEART CATH AND CORONARY ANGIOGRAPHY;  Surgeon: McAlhany, Christopher D, MD;  [facility];  Service: Cardiovascular;  Laterality: N/A;   LEFT HEART CATHETERIZATION WITH CORONARY ANGIOGRAM N/A 12/12/2011   Procedure: LEFT HEART CATHETERIZATION WITH CORONARY ANGIOGRAM;  Surgeon: Christopher D McAlhany, MD;  [facility];  Service: Cardiovascular;  Laterality: N/A;   LOWER EXTREMITY ANGIOGRAM N/A 01/31/2012   Procedure: LOWER EXTREMITY ANGIOGRAM;  Surgeon: Christopher D McAlhany, MD;  [facility];  Service: Cardiovascular;  Laterality: N/A;   LOWER EXTREMITY ANGIOGRAPHY N/A 04/23/2018   Procedure: LOWER EXTREMITY  ANGIOGRAPHY;  Surgeon: Brabham, Vance W, MD;  [facility];  Service: Cardiovascular;  Laterality: N/A;   PERCUTANEOUS CORONARY STENT INTERVENTION (PCI-S) Right 12/12/2011   Procedure: PERCUTANEOUS CORONARY STENT INTERVENTION (PCI-S);  Surgeon: Christopher D McAlhany, MD;  [facility];  Service: Cardiovascular;  Laterality: Right;   PERIPHERAL VASCULAR INTERVENTION Left 04/23/2018   Procedure: PERIPHERAL VASCULAR INTERVENTION;  Surgeon: Brabham, Vance W, MD;  [facility];  Service: Cardiovascular;  Laterality: Left;  superficial femoral   REPAIR EXTENSOR TENDON Right 11/21/2014   Procedure: WITH REPAIR/RECONSTRUCTION OF EXTENSOR TENDONS AS NEEDED;  Surgeon: William Gramig, MD;  [facility];  Service: Orthopedics;  Laterality: Right;   TEE WITHOUT CARDIOVERSION N/A 05/05/2020   Procedure: TRANSESOPHAGEAL ECHOCARDIOGRAM (TEE);  Surgeon: Gerhardt, Edward B, MD;  [facility]: [facility];  Service: Open Heart Surgery;  Laterality: N/A;    Allergies  Allergen Reactions   Hydrocodone Hives and Nausea And Vomiting   Metformin     Other reaction(s): Other (See Comments) Water blisters between fingers    Current Outpatient Medications  Medication Sig Dispense Refill   aspirin EC 81 MG tablet Take 1 tablet (81 mg total) by mouth daily. 90 tablet 3   carvedilol (COREG) 12.5 MG tablet Take 1 tablet (12.5 mg total) by mouth 2 (two) times daily with a meal. 60 tablet 3   cyclobenzaprine (FLEXERIL) 10 MG tablet Take 1 tablet (10 mg total) by mouth 2 (two) times daily as needed for muscle spasms. 30 tablet 0   diazepam (VALIUM) 5 MG tablet Take 5 mg by mouth daily as needed.     ELIQUIS 5 MG TABS tablet TAKE 1 TABLET BY MOUTH 2 TIMES A DAY 180 tablet 1   enalapril (VASOTEC) 5 MG tablet Take 1 tablet (5 mg total) by mouth daily. 60 tablet 2   FARXIGA 10 MG TABS tablet Take 10 mg by mouth daily.     furosemide (LASIX) 40 MG tablet Take 1 tablet (40 mg total) by mouth daily.  90 tablet 3   gabapentin (NEURONTIN) 300 MG capsule Take 300 mg by mouth 3 (three) times daily.     insulin glargine (LANTUS) 100 UNIT/ML Solostar Pen Inject 80 Units into the skin at bedtime.      insulin lispro (HUMALOG) 100 UNIT/ML injection Inject into the skin 3 (three) times daily before meals.   10-15 units sliding scale prn     nitroGLYCERIN (NITROSTAT) 0.4 MG SL tablet Place under the tongue.     oxyCODONE-acetaminophen (PERCOCET) 10-325 MG tablet Take 1 tablet by mouth every 4 (four) hours as needed for pain.     pantoprazole (PROTONIX) 40 MG tablet Take 40 mg by mouth daily.     potassium chloride (KLOR-CON) 10 MEQ tablet Take 1 tablet (10 mEq total) by mouth daily. 30 tablet 6   rosuvastatin (CRESTOR) 40 MG tablet Take 1 tablet (40 mg total) by mouth daily. 90 tablet 3   spironolactone (ALDACTONE) 25 MG tablet TAKE 1/2 TABLET BY MOUTH EVERY DAY 45 tablet 2   traZODone (DESYREL) 100 MG tablet Take 100 mg by mouth at bedtime as needed for sleep.     Vitamin D, Ergocalciferol, (DRISDOL) 1.25 MG (50000 UNIT) CAPS capsule Take 50,000 Units by mouth once a week.     No current facility-administered medications for this visit.    Family History  Problem Relation Age of Onset   Cancer Father        Lung   Diabetes Mother    COPD Mother    Cancer Maternal Uncle        Colon    Social History   Socioeconomic History   Marital status: Divorced    Spouse name: Not on file   Number of children: Not on file   Years of education: Not on file   Highest education level: Not on file  Occupational History    Employer: OTHER  Tobacco Use   Smoking status: Former    Packs/day: 0.25    Years: 25.00    Pack years: 6.25    Types: Cigarettes   Smokeless tobacco: Never   Tobacco comments:    1-2 cigs a day  Vaping Use   Vaping Use: Never used  Substance and Sexual Activity   Alcohol use: Yes    Alcohol/week: 4.0 standard drinks    Types: 4 Cans of beer per week   Drug use: No    Sexual activity: Yes  Other Topics Concern   Not on file  Social History Narrative   Disabled - no longer works   Social Determinants of Health   Financial Resource Strain: Not on file  Food Insecurity: Not on file  Transportation Needs: Not on file  Physical Activity: Not on file  Stress: Not on file  Social Connections: Not on file  Intimate Partner Violence: Not on file     REVIEW OF SYSTEMS:   [X] denotes positive finding, [ ] denotes negative finding Cardiac  Comments:  Chest pain or chest pressure:    Shortness of breath upon exertion:    Short of breath when lying flat:    Irregular heart rhythm:        Vascular    Pain in calf, thigh, or hip brought on by ambulation: x   Pain in feet at night that wakes you up from your sleep:     Blood clot in your veins:    Leg swelling:         Pulmonary    Oxygen at home:    Wheezing:         Neurologic    Sudden weakness in arms or legs:     Sudden numbness in arms or legs:     Sudden onset of difficulty speaking or understanding others    Temporary loss of vision in one eye:       Problems with dizziness:         Gastrointestinal    Blood in stool:     Vomited blood:         Genitourinary    Burning when urinating:     Blood in urine:        Psychiatric    Major depression:         Hematologic    Bleeding problems:    Problems with blood clotting too easily:        Skin    Rashes or ulcers:        Constitutional    Fever or chills:      PHYSICAL EXAMINATION:  Today's Vitals   06/13/21 1423 06/13/21 1426  BP: 125/86 114/78  Pulse: 96   Resp: 20   Temp: 98 F (36.7 C)   SpO2: 97%   Weight: 230 lb (104.3 kg)   Height: 5' 9" (1.753 m)    Body mass index is 33.97 kg/m.   General:  WDWN in NAD; vital signs documented above Gait: Not observed HENT: WNL, normocephalic Pulmonary: normal non-labored breathing  Cardiac: regular HR;  with carotid bruit on the left Abdomen: soft, NT, aortic pulse  is not palpable Skin: without rashes Vascular Exam/Pulses:  Right Left  Radial 2+ (normal) 2+ (normal)  Femoral 2+ (normal) 2+ (normal)  Popliteal Unable to palpate Unable to palpate  DP Faint monophasic doppler Monophasic doppler  PT Brisk monophasic doppler Brisk monophasic doppler  Peroneal Faint monophasic doppler Faint monophasic doppler   Extremities: without ischemic changes, without Gangrene , without cellulitis; without open wounds;  Musculoskeletal: no muscle wasting or atrophy  Neurologic: A&O X 3;  speech is fluent/normal; moving all extremities equally  Psychiatric:  The pt has Normal affect.   Non-Invasive Vascular Imaging:   ABI's/TBI's on 06/13/2021: Right:  0.91/0.76 - great toe pressure:  89 Left:  1.03/1.0 - great toe pressure:  117   Arterial duplex on 06/13/2021: +-----------+--------+-----+--------+----------+--------+  RIGHT      PSV cm/sRatioStenosisWaveform  Comments  +-----------+--------+-----+--------+----------+--------+  ATA Distal 11                   monophasic          +-----------+--------+-----+--------+----------+--------+  PTA Distal 47                   monophasic          +-----------+--------+-----+--------+----------+--------+  PERO Distal7                    monophasic          +-----------+--------+-----+--------+----------+--------+        Right Graft #1:  +------------------+--------+-------------+----------+--------------------+                     PSV cm/sStenosis     Waveform  Comments                +------------------+--------+-------------+----------+--------------------+   Inflow            80                   monophasic                        +------------------+--------+-------------+----------+--------------------+   Prox Anastomosis  432     >70% stenosis          Rt limb of  ao-bi-fem  +------------------+--------+-------------+----------+--------------------+    Proximal Graft      36                   monophasic                        +------------------+--------+-------------+----------+--------------------+   Mid Graft         42                   monophasic                        +------------------+--------+-------------+----------+--------------------+   Distal Graft      48                   monophasic                        +------------------+--------+-------------+----------+--------------------+   Distal Anastomosis35                   monophasic                        +------------------+--------+-------------+----------+--------------------+   Outflow           54                   monophasic                        +------------------+--------+-------------+----------+--------------------+  Non-Invasive Vascular Imaging:   Carotid Duplex on 06/13/2021: Right:  1-39% ICA stenosis Left:  1-39% ICA stenosis  Previous ABI on 05/04/2020: Right:  0.95 (T) Left:  1.05 (T)   Previous Carotid duplex on 05/04/2020: Right: 1-39% ICA stenosis Left:   1-39% ICA stenosis    ASSESSMENT/PLAN:: 54 y.o. male here for follow up for PAD with hx of ABF bypass in 2014.  He developed subclavian steal with left arm claudication and had a left carotid subclavian bypass 04/03/2013 and had to return to the operating room for drainage of lymphocele.  Pt also has hx of claudication and had balloon angioplasty of the right CFA but did not have significant improvement in his sx.  On 03/13/2018, he underwent right femoral to above knee popliteal bypass with GSV for claudication by Dr. Brabham.    PAD -pt with lifestyle limiting claudication of the RLE.  He currently does not have any rest pain or non healing wounds.  He has elevated velocities at the proximal anastomosis and decreased velocities distally. -will schedule him for arterigram to evaluate bloodflow.  Discussed with him that if he can intervene at that time he will,  but if not, he may need further surgical intervention.   -pt is on Eliquis for PAF and will need to be off of this a couple of days prior to procedure.   -continue graduated walking program   Hx of left carotid - subclavian bypass 04/03/2013 for subclavian steal -duplex today reveals 1-39% bilateral ICA stenosis and patent bypass. He has not had any stroke sx.   -discussed s/s of stroke/TIA and if pt develops any sx, they know to call 911 or go to the emergency room -pt will f/u in one year with duplex  -continue statin/asa   Persephonie Hegwood, PAC [facility] [phone]  Clinic MD:   Clark on call MD   

## 2021-06-13 ENCOUNTER — Ambulatory Visit (INDEPENDENT_AMBULATORY_CARE_PROVIDER_SITE_OTHER): Payer: Medicaid Other | Admitting: Physician Assistant

## 2021-06-13 ENCOUNTER — Ambulatory Visit (HOSPITAL_COMMUNITY)
Admission: RE | Admit: 2021-06-13 | Discharge: 2021-06-13 | Disposition: A | Discharge status: Home / Self Care | Payer: Medicaid Other | Source: Ambulatory Visit | Attending: Physician Assistant | Admitting: Physician Assistant

## 2021-06-13 ENCOUNTER — Other Ambulatory Visit: Payer: Self-pay

## 2021-06-13 ENCOUNTER — Ambulatory Visit (INDEPENDENT_AMBULATORY_CARE_PROVIDER_SITE_OTHER)
Admission: RE | Admit: 2021-06-13 | Discharge: 2021-06-13 | Disposition: A | Discharge status: Home / Self Care | Payer: Medicaid Other | Source: Ambulatory Visit | Attending: Physician Assistant | Admitting: Physician Assistant

## 2021-06-13 ENCOUNTER — Ambulatory Visit (INDEPENDENT_AMBULATORY_CARE_PROVIDER_SITE_OTHER)
Admission: RE | Admit: 2021-06-13 | Discharge: 2021-06-13 | Disposition: A | Discharge status: Home / Self Care | Payer: Medicaid Other | Source: Ambulatory Visit | Attending: Surgery | Admitting: Surgery

## 2021-06-13 VITALS — BP 114/78 | HR 96 | Temp 98.0°F | Resp 20 | Ht 69.0 in | Wt 230.0 lb

## 2021-06-13 DIAGNOSIS — I739 Peripheral vascular disease, unspecified: Secondary | ICD-10-CM

## 2021-06-13 DIAGNOSIS — G458 Other transient cerebral ischemic attacks and related syndromes: Secondary | ICD-10-CM | POA: Insufficient documentation

## 2021-06-28 ENCOUNTER — Other Ambulatory Visit: Payer: Self-pay

## 2021-06-28 ENCOUNTER — Ambulatory Visit (HOSPITAL_COMMUNITY)
Admission: RE | Admit: 2021-06-28 | Discharge: 2021-06-28 | Disposition: A | Discharge status: Home / Self Care | Payer: Medicaid Other | Attending: Surgery | Admitting: Surgery

## 2021-06-28 ENCOUNTER — Encounter (HOSPITAL_COMMUNITY)
Admission: RE | Disposition: A | Discharge status: Home / Self Care | Payer: Self-pay | Source: Home / Self Care | Attending: Surgery

## 2021-06-28 DIAGNOSIS — I5022 Chronic systolic (congestive) heart failure: Secondary | ICD-10-CM | POA: Diagnosis not present

## 2021-06-28 DIAGNOSIS — E1151 Type 2 diabetes mellitus with diabetic peripheral angiopathy without gangrene: Secondary | ICD-10-CM | POA: Insufficient documentation

## 2021-06-28 DIAGNOSIS — Z794 Long term (current) use of insulin: Secondary | ICD-10-CM | POA: Diagnosis not present

## 2021-06-28 DIAGNOSIS — Z7982 Long term (current) use of aspirin: Secondary | ICD-10-CM | POA: Insufficient documentation

## 2021-06-28 DIAGNOSIS — Z79899 Other long term (current) drug therapy: Secondary | ICD-10-CM | POA: Diagnosis not present

## 2021-06-28 DIAGNOSIS — Z885 Allergy status to narcotic agent status: Secondary | ICD-10-CM | POA: Insufficient documentation

## 2021-06-28 DIAGNOSIS — I110 Hypertensive heart disease with heart failure: Secondary | ICD-10-CM | POA: Diagnosis not present

## 2021-06-28 DIAGNOSIS — Z888 Allergy status to other drugs, medicaments and biological substances status: Secondary | ICD-10-CM | POA: Diagnosis not present

## 2021-06-28 DIAGNOSIS — I480 Paroxysmal atrial fibrillation: Secondary | ICD-10-CM | POA: Diagnosis not present

## 2021-06-28 DIAGNOSIS — Z87891 Personal history of nicotine dependence: Secondary | ICD-10-CM | POA: Diagnosis not present

## 2021-06-28 DIAGNOSIS — Z7984 Long term (current) use of oral hypoglycemic drugs: Secondary | ICD-10-CM | POA: Insufficient documentation

## 2021-06-28 DIAGNOSIS — I6523 Occlusion and stenosis of bilateral carotid arteries: Secondary | ICD-10-CM | POA: Diagnosis not present

## 2021-06-28 DIAGNOSIS — I70211 Atherosclerosis of native arteries of extremities with intermittent claudication, right leg: Secondary | ICD-10-CM | POA: Insufficient documentation

## 2021-06-28 DIAGNOSIS — Z7901 Long term (current) use of anticoagulants: Secondary | ICD-10-CM | POA: Diagnosis not present

## 2021-06-28 HISTORY — PX: PERIPHERAL VASCULAR BALLOON ANGIOPLASTY: CATH118281

## 2021-06-28 HISTORY — PX: ABDOMINAL AORTOGRAM W/LOWER EXTREMITY: CATH118223

## 2021-06-28 LAB — POCT I-STAT, CHEM 8
BUN: 16 mg/dL (ref 6–20)
Calcium, Ion: 1.22 mmol/L (ref 1.15–1.40)
Chloride: 100 mmol/L (ref 98–111)
Creatinine, Ser: 1.1 mg/dL (ref 0.61–1.24)
Glucose, Bld: 95 mg/dL (ref 70–99)
HCT: 55 % — ABNORMAL HIGH (ref 39.0–52.0)
Hemoglobin: 18.7 g/dL — ABNORMAL HIGH (ref 13.0–17.0)
Potassium: 3.7 mmol/L (ref 3.5–5.1)
Sodium: 140 mmol/L (ref 135–145)
TCO2: 29 mmol/L (ref 22–32)

## 2021-06-28 SURGERY — ABDOMINAL AORTOGRAM W/LOWER EXTREMITY
Anesthesia: LOCAL | Laterality: Right

## 2021-06-28 MED ORDER — LABETALOL HCL 5 MG/ML IV SOLN: 10.0000 mg | INTRAVENOUS | Status: DC | PRN

## 2021-06-28 MED ORDER — LIDOCAINE HCL (PF) 1 % IJ SOLN
INTRAMUSCULAR | Status: DC | PRN
  Administered 2021-06-28: 15 mL via INTRADERMAL

## 2021-06-28 MED ORDER — CLOPIDOGREL BISULFATE 75 MG PO TABS: 75.0000 mg | ORAL_TABLET | Freq: Every day | ORAL | Status: DC

## 2021-06-28 MED ORDER — HEPARIN (PORCINE) IN NACL 1000-0.9 UT/500ML-% IV SOLN
INTRAVENOUS | Status: AC
  Filled 2021-06-28: qty 500

## 2021-06-28 MED ORDER — FENTANYL CITRATE (PF) 100 MCG/2ML IJ SOLN
INTRAMUSCULAR | Status: AC
  Filled 2021-06-28: qty 2

## 2021-06-28 MED ORDER — ASPIRIN 81 MG PO CHEW
CHEWABLE_TABLET | ORAL | Status: AC
  Administered 2021-06-28: 81 mg
  Filled 2021-06-28: qty 1

## 2021-06-28 MED ORDER — HEPARIN SODIUM (PORCINE) 1000 UNIT/ML IJ SOLN
INTRAMUSCULAR | Status: AC
  Filled 2021-06-28: qty 1

## 2021-06-28 MED ORDER — SODIUM CHLORIDE 0.9 % IV SOLN: INTRAVENOUS | Status: DC

## 2021-06-28 MED ORDER — LIDOCAINE HCL (PF) 1 % IJ SOLN
INTRAMUSCULAR | Status: AC
  Filled 2021-06-28: qty 30

## 2021-06-28 MED ORDER — SODIUM CHLORIDE 0.9% FLUSH: 3.0000 mL | Freq: Two times a day (BID) | INTRAVENOUS | Status: DC

## 2021-06-28 MED ORDER — CLOPIDOGREL BISULFATE 300 MG PO TABS
ORAL_TABLET | ORAL | Status: AC
  Filled 2021-06-28: qty 1

## 2021-06-28 MED ORDER — HYDRALAZINE HCL 20 MG/ML IJ SOLN: 5.0000 mg | INTRAMUSCULAR | Status: DC | PRN

## 2021-06-28 MED ORDER — ASPIRIN EC 81 MG PO TBEC: 81.0000 mg | DELAYED_RELEASE_TABLET | Freq: Every day | ORAL | Status: DC

## 2021-06-28 MED ORDER — MIDAZOLAM HCL 2 MG/2ML IJ SOLN
INTRAMUSCULAR | Status: AC
  Filled 2021-06-28: qty 2

## 2021-06-28 MED ORDER — SODIUM CHLORIDE 0.9 % WEIGHT BASED INFUSION
1.0000 mL/kg/h | INTRAVENOUS | Status: DC
  Administered 2021-06-28: 1 mL/kg/h via INTRAVENOUS

## 2021-06-28 MED ORDER — HEPARIN SODIUM (PORCINE) 1000 UNIT/ML IJ SOLN
INTRAMUSCULAR | Status: DC | PRN
  Administered 2021-06-28: 10000 [IU] via INTRAVENOUS

## 2021-06-28 MED ORDER — PANTOPRAZOLE SODIUM 40 MG PO TBEC
40.0000 mg | DELAYED_RELEASE_TABLET | Freq: Once | ORAL | Status: DC
  Filled 2021-06-28: qty 1

## 2021-06-28 MED ORDER — ACETAMINOPHEN 325 MG PO TABS: 650.0000 mg | ORAL_TABLET | ORAL | Status: DC | PRN

## 2021-06-28 MED ORDER — HEPARIN (PORCINE) IN NACL 1000-0.9 UT/500ML-% IV SOLN
INTRAVENOUS | Status: DC | PRN
  Administered 2021-06-28 (×2): 500 mL

## 2021-06-28 MED ORDER — FENTANYL CITRATE (PF) 100 MCG/2ML IJ SOLN
INTRAMUSCULAR | Status: DC | PRN
  Administered 2021-06-28 (×2): 50 ug via INTRAVENOUS

## 2021-06-28 MED ORDER — CLOPIDOGREL BISULFATE 75 MG PO TABS: 300.0000 mg | ORAL_TABLET | Freq: Once | ORAL | Status: DC

## 2021-06-28 MED ORDER — SODIUM CHLORIDE 0.9% FLUSH: 3.0000 mL | INTRAVENOUS | Status: DC | PRN

## 2021-06-28 MED ORDER — CLOPIDOGREL BISULFATE 75 MG PO TABS: 75.0000 mg | ORAL_TABLET | Freq: Every day | ORAL | 0 refills | Status: DC

## 2021-06-28 MED ORDER — CLOPIDOGREL BISULFATE 300 MG PO TABS
ORAL_TABLET | ORAL | Status: DC | PRN
  Administered 2021-06-28: 300 mg via ORAL

## 2021-06-28 MED ORDER — SODIUM CHLORIDE 0.9 % IV SOLN: 250.0000 mL | INTRAVENOUS | Status: DC | PRN

## 2021-06-28 MED ORDER — ONDANSETRON HCL 4 MG/2ML IJ SOLN: 4.0000 mg | Freq: Four times a day (QID) | INTRAMUSCULAR | Status: DC | PRN

## 2021-06-28 MED ORDER — MORPHINE SULFATE (PF) 2 MG/ML IV SOLN: 2.0000 mg | INTRAVENOUS | Status: DC | PRN

## 2021-06-28 MED ORDER — MIDAZOLAM HCL 2 MG/2ML IJ SOLN
INTRAMUSCULAR | Status: DC | PRN
  Administered 2021-06-28 (×2): 2 mg via INTRAVENOUS

## 2021-06-28 SURGICAL SUPPLY — 20 items
BALLN STERLING OTW 8X20X135 (BALLOONS) ×3
BALLOON STERLING OTW 8X20X135 (BALLOONS) ×2 IMPLANT
CATH OMNI FLUSH 5F 65CM (CATHETERS) ×3 IMPLANT
CATH SOFT-VU ST 4F 90CM (CATHETERS) ×3 IMPLANT
CATH SOS OMNI O 5F 80CM (CATHETERS) ×3 IMPLANT
DCB RANGER 6.0X40 135 (BALLOONS) ×2 IMPLANT
DEVICE VASC CLSR CELT ART 5 (Vascular Products) ×3 IMPLANT
GLIDEWIRE ADV .035X260CM (WIRE) ×3 IMPLANT
KIT ENCORE 26 ADVANTAGE (KITS) ×3 IMPLANT
KIT MICROPUNCTURE NIT STIFF (SHEATH) ×3 IMPLANT
KIT PV (KITS) ×3 IMPLANT
RANGER DCB 6.0X40 135 (BALLOONS) ×3
SHEATH DESTINATION MP 5FR 45CM (SHEATH) ×3 IMPLANT
SHEATH PINNACLE 5F 10CM (SHEATH) ×3 IMPLANT
SHEATH PROBE COVER 6X72 (BAG) ×3 IMPLANT
SYR MEDRAD MARK V 150ML (SYRINGE) ×3 IMPLANT
TRANSDUCER W/STOPCOCK (MISCELLANEOUS) ×3 IMPLANT
TRAY PV CATH (CUSTOM PROCEDURE TRAY) ×3 IMPLANT
WIRE BENTSON .035X145CM (WIRE) ×3 IMPLANT
WIRE G V18X300CM (WIRE) ×3 IMPLANT

## 2021-06-28 NOTE — Interval H&P Note (Signed)
History and Physical Interval Note:  06/28/2021 9:55 AM  Ryan [name]  has presented today for surgery, with the diagnosis of claudication.  The various methods of treatment have been discussed with the patient and family. After consideration of risks, benefits and other options for treatment, the patient has consented to  Procedure(s): ABDOMINAL AORTOGRAM W/LOWER EXTREMITY (N/A) as a surgical intervention.  The patient's history has been reviewed, patient examined, no change in status, stable for surgery.  I have reviewed the patient's chart and labs.  Questions were answered to the patient's satisfaction.     Durene Cal

## 2021-06-28 NOTE — Op Note (Addendum)
Patient name: Ryan [name] MRN: [id number] DOB: 1966-07-27 Sex: male  06/28/2021 Pre-operative Diagnosis: Right leg claudication Post-operative diagnosis:  Same Surgeon:  Durene Cal Procedure Performed:  1.  Ultrasound-guided access, left femoral artery  2.  Abdominal aortogram  3.  Bilateral lower extremity runoff  4.  Drug-coated balloon angioplasty, right common femoral artery  5.  Conscious sedation, 76 minutes  6.  Closure device, Celt    Indications: This is a 55 year old gentleman, status post aortobifemoral bypass graft and right femoral-popliteal bypass graft with left superficial femoral artery stenting.  He has developed progressive symptoms in the right leg.  Ultrasound indicated a common femoral gnosis.  He is here for further evaluation and possible intervention  Procedure:  The patient was identified in the holding area and taken to room 8.  The patient was then placed supine on the table and prepped and draped in the usual sterile fashion.  A time out was called.  Conscious sedation was administered with the use of IV fentanyl and Versed under continuous physician and nurse monitoring.  Heart rate, blood pressure, and oxygen saturation were continuously monitored.  Total sedation time was 76 minutes.  Ultrasound was used to evaluate the left common femoral artery.  It was patent .  A digital ultrasound image was acquired.  A micropuncture needle was used to access the left common femoral artery under ultrasound guidance.  An 018 wire was advanced without resistance and a micropuncture sheath was placed.  The 018 wire was removed and a benson wire was placed.  The micropuncture sheath was exchanged for a 5 french sheath.  An omniflush catheter was advanced over the wire to the level of L-1.  An abdominal angiogram was obtained.  Next, a SOS catheter was placed into the right limb of the aortic graft and right leg runoff was performed.  Left leg evaluation was performed via  sheath injections. Findings:   Aortogram: No significant renal artery stenosis was identified.  The aortic graft is widely patent without stenosis.  Both limbs of the graft are widely patent.  Right Lower Extremity: At the level of the common femoral anastomosis to the aortic graft, there is a significant lesion, approximately 80%.  The profundofemoral artery is widely patent.  The femoral-popliteal bypass graft is widely patent.  There is three-vessel runoff to the ankle with the posterior tibial his abdominal vessel across the ankle  Left Lower Extremity: The left common femoral artery is widely patent.  There is mild luminal irregularity at the left femoral anastomosis.  The profundofemoral artery is widely patent.  The stents within the superficial femoral artery are widely patent with three-vessel runoff.  Intervention: After the above images were acquired the decision was made to proceed with intervention.  Using a Glidewire advantage, the wire was placed into the bypass graft.  I then selected a 5 x 45 sheath and advanced this over the aortic bifurcation into the right limb of the graft.  I then switched out for an 018 wire.  Patient was fully heparinized.  I then performed drug-coated balloon angioplasty of the stenosis using a 6 x 40 Ranger balloon.  There was residual postintervention stenosis and so I upsized to an 8 x 20 Sterling and repeated balloon angioplasty.  Upon completion, stenosis was approximately 10%.  Satisfied with these results and elected to stop the procedure.  The sheath was closed using a Celt.  Impression:  #1  Greater than 80% right common femoral artery anastomotic  stenosis treated using a 6 mm Ranger balloon followed by an 8 mm Sterling balloon with residual stenosis approximately 10%  #2  Widely patent right femoral-popliteal bypass graft  #3  Widely patent left superficial femoral artery stents  Patient will stop taking Plavix and 1 month  V. Durene Cal, M.D.,  [facility] [place] Office: [phone] Pager:  [phone]

## 2021-06-29 ENCOUNTER — Encounter (HOSPITAL_COMMUNITY): Payer: Self-pay | Admitting: Surgery

## 2021-07-21 ENCOUNTER — Other Ambulatory Visit: Payer: Self-pay

## 2021-07-21 DIAGNOSIS — I739 Peripheral vascular disease, unspecified: Secondary | ICD-10-CM

## 2021-08-01 ENCOUNTER — Other Ambulatory Visit (HOSPITAL_COMMUNITY): Payer: Medicaid Other

## 2021-08-01 ENCOUNTER — Encounter (HOSPITAL_COMMUNITY): Payer: Medicaid Other

## 2021-08-02 ENCOUNTER — Other Ambulatory Visit: Payer: Self-pay | Admitting: Cardiovascular Disease

## 2021-08-31 ENCOUNTER — Other Ambulatory Visit: Payer: Self-pay | Admitting: Cardiovascular Disease

## 2021-08-31 NOTE — Telephone Encounter (Signed)
Prescription refill request for Eliquis received. Indication:Afib  Last office visit: 04/11/21 Alben Spittle) Scr: 1.10 (06/28/21) Age: 55 Weight: 103.4kg  Appropriate dose and refill sent to requested pharmacy.

## 2022-02-03 ENCOUNTER — Ambulatory Visit: Payer: Medicaid Other | Admitting: Cardiovascular Disease

## 2022-02-03 NOTE — Progress Notes (Unsigned)
No chief complaint on file.  History of Present Illness: 56 yo male with history of ongoing tobacco abuse, CAD, HLD, PAD, HLD, ischemic cardiomyopathy and insulin dependent diabetes mellitus who is here today for cardiac follow up. He had an anterior STEMI in January 2013 with cardiac arrest and found to have an occluded mid LAD treated with a drug eluting stent. The third OM branch was chronically occluded and the RCA had mild disease. His post MI LVEF was 35% but this was found to be normal on follow up echo. Cardiac cath in November 2017 with severe stent restenosis in the LAD, treated with a cutting balloon angioplasty. He did not keep his f/u appt in our office following his PCI. He has been non-compliant with follow up, medications over the past few years and continues to smoke. Admitted to [facility] in August 2018 with an anterior STEMI. His LAD was occluded proximal to the old stent and this was treated with a drug eluting stent. Angioplasty performed on the ostial Diagonal branch. LVEF was 40% following the MI. He had unstable angina in June 2020 and repeat cardiac cath showed severe restenosis in the mid LAD stented segment which was treated with cutting balloon angioplasty. He also had ostial stenosis of the diagonal branch (jailed by LAD stents), mild to moderate non-obstructive disease in the mid RCA and moderate LV systolic dysfunction with anteroapical hypokinesis (c/w findings on echo in 2018 at [facility]). LVEF=35-45%. He was placed on DAPT w/ ASA and Brilinta and continued on statin, ? blocker, ACEi and long acting nitrate. I saw him in May 2021 and he c/o chest pain c/w unstable angina. Cardiac cath 04/22/20 with progression of disease in the LAD and RCA. He underwent 3V CABG 05/25/20. (LIMA to LAD, SVG to Diagonal and SVG to RCA). His post-operative course was complicated by atrial fibrillation and he was started on amiodarone. Echo with LVEF=30-35% with apical thrombus.  He was discharged on Eliquis with a Lifevest. Echo 05/11/20 with LVEF=30-35%.  Repeat echo October 2021 with LVEF=50-55%. Amiodarone was stopped in may 2022. He was continued on Eliquis with apical akinesis and prior LV thrombus.   His PAD is followed in [facility] by Dr. Trula Slade. In 2013 he was found to have occlusion of his distal aorta and left subclavian artery. He underwent aortobifemoral bypass in February 2014 and left carotid to subclavian bypass in May 2014.  In April 2019, he underwent right femoral to AK popliteal bypass with saphenous vein and right femoral endarterectomy. Most recent PV procedure 06/28/21 per Dr. Trula Slade with drug coated balloon angioplasty of the right common femoral artery. He was placed on Plavix after that procedure with plans for one month of Plavix therapy.   He is here today for follow up. The patient denies any chest pain, dyspnea, palpitations, lower extremity edema, orthopnea, PND, dizziness, near syncope or syncope.     Primary Care Physician: Nolene Ebbs, MD  Past Medical History:  Diagnosis Date   Acute myocardial infarction of other anterior wall, subsequent episode of care 12/12/2011   Anal fissure    Arthritis    back   Atherosclerosis of native artery of both lower extremities with intermittent claudication ([place]) 04/01/2018   Bruises easily    d/t being on Effient   CAD (coronary artery disease)    A. Acute Ant STEMI 12/12/2011    Cardiomyopathy secondary [id number]   Chronic systolic CHF (congestive heart failure) ([place])    ischemic CM //  Echo 6/21: EF 30-35  //  Echocardiogram 10/21: LV apical thrombus resolved, EF 50-55, ant and apical, ant-lat HK, trivial MR, trivial AI   Chronic total occlusion of artery of the extremities ([place]) 02/26/2012   Claudication ([place]) 12/26/2011   Coronary atherosclerosis of native coronary artery    a. ant STEMI with cardiac arrest 2013 s/p DES to mLAD. b. [place] 10/2016 s/p PTCA to mLAD.   Diabetes mellitus without complication  ([place])    Essential hypertension 10/23/2016   GERD (gastroesophageal reflux disease)    "takes tums"   History of blood transfusion    no abnormal reaction noted   History of kidney stones    Hypertension    Ischemic cardiomyopathy    a. EF 35% in 02/2012 at time of acute MI, improved to normal on subsequent imaging.   MI (myocardial infarction) ([place])    AMI [id number] - complicated by VT/Tosades   Mixed hyperlipidemia    Numbness and tingling of right arm 03/31/2013   Occlusion and stenosis of carotid artery without mention of cerebral infarction 08/26/2012   PAD (peripheral artery disease) ([place])    followed by [facility] - aortobifem bypass 01/2013 & left carotid-subclavian artery bypass 04/2013   Peripheral vascular disease, unspecified ([place]) 12/16/2012   PVD (peripheral vascular disease) ([place]) 03/31/2013   Rectal polyp    Subclavian steal syndrome 08/11/2013   Uncontrolled diabetes mellitus ([place]) 10/23/2016   Wears partial dentures    upper    Past Surgical History:  Procedure Laterality Date   ABDOMINAL AORTOGRAM W/LOWER EXTREMITY N/A 06/26/2017   Procedure: Abdominal Aortogram w/Lower Extremity;  Surgeon: Serafina Mitchell, MD;  Location: [place] [facility];  Service: Cardiovascular;  Laterality: N/A;   ABDOMINAL AORTOGRAM W/LOWER EXTREMITY N/A 06/28/2021   Procedure: ABDOMINAL AORTOGRAM W/LOWER EXTREMITY;  Surgeon: Serafina Mitchell, MD;  Location: [place] [facility];  Service: Cardiovascular;  Laterality: N/A;   ANAL FISSURECTOMY     AORTA - BILATERAL FEMORAL ARTERY BYPASS GRAFT N/A 01/16/2013   Procedure: AORTA BIFEMORAL BYPASS GRAFT;  Surgeon: Serafina Mitchell, MD;  Location: [place];  Service: Vascular;  Laterality: N/A;   APPENDECTOMY     CARDIAC CATHETERIZATION N/A 10/23/2016   Procedure: Left Heart Cath and Coronary Angiography;  Surgeon: Burnell Blanks, MD;  Location: [place] [facility];  Service: Cardiovascular;  Laterality: N/A;   CARDIAC CATHETERIZATION N/A 10/23/2016    Procedure: Coronary Balloon Angioplasty;  Surgeon: Burnell Blanks, MD;  Location: [place] [facility];  Service: Cardiovascular;  Laterality: N/A;   CAROTID-SUBCLAVIAN BYPASS GRAFT Left 04/03/2013   Procedure: BYPASS GRAFT CAROTID-SUBCLAVIAN;  Surgeon: Serafina Mitchell, MD;  Location: [place];  Service: Vascular;  Laterality: Left;   CIRCUMCISION N/A 12/15/2015   Procedure: CIRCUMCISION ADULT;  Surgeon: Cleon Gustin, MD;  Location: [facility];  Service: Urology;  Laterality: N/A;   COLONOSCOPY     CORONARY ANGIOPLASTY     stent placed Dec 12, 2011 and 2018   CORONARY ARTERY BYPASS GRAFT N/A 05/05/2020   Procedure: CORONARY ARTERY BYPASS GRAFTING (CABG) x Three, Using Left internal Mammary Artery and Left Leg greater saphenous vein harvested endoscopically;  Surgeon: Grace Isaac, MD;  Location: [place];  Service: Open Heart Surgery;  Laterality: N/A;   CORONARY BALLOON ANGIOPLASTY N/A 05/23/2019   Procedure: CORONARY BALLOON ANGIOPLASTY;  Surgeon: Burnell Blanks, MD;  Location: [place] [facility];  Service: Cardiovascular;  Laterality: N/A;   DIAGNOSTIC LAPAROSCOPY     ENDARTERECTOMY  FEMORAL Right 03/13/2018   Procedure: FEMORAL ENDARTERECTOMY RIGHT;  Surgeon: Serafina Mitchell, MD;  Location[facility];  Service: Vascular;  Laterality: Right;   FEMORAL-POPLITEAL BYPASS GRAFT Right 03/13/2018   Procedure: FEMORAL-POPLITEAL ARTERY BYPASS WITH NON-REVERSE VEIN RIGHT;  Surgeon: Serafina Mitchell, MD;  Location: [facility];  Service: Vascular;  Laterality: Right;   GROIN DISSECTION Right 03/13/2018   Procedure: RE-DO COMMON FEMORAL ARTERY EXPOSURE;  Surgeon: Serafina Mitchell, MD;  Location: [facility];  Service: Vascular;  Laterality: Right;   I & D EXTREMITY Left 04/18/2013   Procedure: debridement of left neck lymphocele;  Surgeon: Serafina Mitchell, MD;  Location: [place];  Service: Vascular;  Laterality: Left;  I and D of left neck   I & D EXTREMITY Right 11/21/2014   Procedure: IRRIGATION AND DEBRIDEMENT  RIGHT HAND;  Surgeon: Roseanne Kaufman, MD;  Location: [place];  Service: Orthopedics;  Laterality: Right;   INTRAVASCULAR PRESSURE WIRE/FFR STUDY N/A 04/22/2020   Procedure: INTRAVASCULAR PRESSURE WIRE/FFR STUDY;  Surgeon: Burnell Blanks, MD;  Location: [place] [facility];  Service: Cardiovascular;  Laterality: N/A;   LEFT HEART CATH AND CORONARY ANGIOGRAPHY N/A 05/23/2019   Procedure: LEFT HEART CATH AND CORONARY ANGIOGRAPHY;  Surgeon: Burnell Blanks, MD;  Location: [place] [facility];  Service: Cardiovascular;  Laterality: N/A;   LEFT HEART CATH AND CORONARY ANGIOGRAPHY N/A 04/22/2020   Procedure: LEFT HEART CATH AND CORONARY ANGIOGRAPHY;  Surgeon: Burnell Blanks, MD;  Location: [place] [facility];  Service: Cardiovascular;  Laterality: N/A;   LEFT HEART CATHETERIZATION WITH CORONARY ANGIOGRAM N/A 12/12/2011   Procedure: LEFT HEART CATHETERIZATION WITH CORONARY ANGIOGRAM;  Surgeon: Burnell Blanks, MD;  Location: [facility] CATH LAB;  Service: Cardiovascular;  Laterality: N/A;   LOWER EXTREMITY ANGIOGRAM N/A 01/31/2012   Procedure: LOWER EXTREMITY ANGIOGRAM;  Surgeon: Burnell Blanks, MD;  Location: [facility] CATH LAB;  Service: Cardiovascular;  Laterality: N/A;   LOWER EXTREMITY ANGIOGRAPHY N/A 04/23/2018   Procedure: LOWER EXTREMITY ANGIOGRAPHY;  Surgeon: Serafina Mitchell, MD;  Location: [place] [facility];  Service: Cardiovascular;  Laterality: N/A;   PERCUTANEOUS CORONARY STENT INTERVENTION (PCI-S) Right 12/12/2011   Procedure: PERCUTANEOUS CORONARY STENT INTERVENTION (PCI-S);  Surgeon: Burnell Blanks, MD;  Location: [facility] CATH LAB;  Service: Cardiovascular;  Laterality: Right;   PERIPHERAL VASCULAR BALLOON ANGIOPLASTY Right 06/28/2021   Procedure: PERIPHERAL VASCULAR BALLOON ANGIOPLASTY;  Surgeon: Serafina Mitchell, MD;  Location: [place] [facility];  Service: Cardiovascular;  Laterality: Right;  common femoral (graft)   PERIPHERAL VASCULAR INTERVENTION Left 04/23/2018    Procedure: PERIPHERAL VASCULAR INTERVENTION;  Surgeon: Serafina Mitchell, MD;  Location: [place] [facility];  Service: Cardiovascular;  Laterality: Left;  superficial femoral   REPAIR EXTENSOR TENDON Right 11/21/2014   Procedure: WITH REPAIR/RECONSTRUCTION OF EXTENSOR TENDONS AS NEEDED;  Surgeon: Roseanne Kaufman, MD;  Location: [place];  Service: Orthopedics;  Laterality: Right;   TEE WITHOUT CARDIOVERSION N/A 05/05/2020   Procedure: TRANSESOPHAGEAL ECHOCARDIOGRAM (TEE);  Surgeon: Grace Isaac, MD;  Location: [place];  Service: Open Heart Surgery;  Laterality: N/A;    Current Outpatient Medications  Medication Sig Dispense Refill   amiodarone (PACERONE) 200 MG tablet Take 1 tablet (200 mg total) by mouth daily. 90 tablet 2   aspirin EC 81 MG tablet Take 1 tablet (81 mg total) by mouth daily. 90 tablet 3   carvedilol (COREG) 12.5 MG tablet Take 1 tablet (12.5 mg total) by mouth 2 (two) times daily  with a meal. 60 tablet 3   cetirizine (ZYRTEC) 10 MG tablet Take 10 mg by mouth daily.     clopidogrel (PLAVIX) 75 MG tablet Take 1 tablet (75 mg total) by mouth daily. 30 tablet 0   cyclobenzaprine (FLEXERIL) 10 MG tablet Take 1 tablet (10 mg total) by mouth 2 (two) times daily as needed for muscle spasms. 30 tablet 0   diazepam (VALIUM) 5 MG tablet Take 5 mg by mouth daily as needed for anxiety.     ELIQUIS 5 MG TABS tablet TAKE 1 TABLET BY MOUTH 2 TIMES A DAY 180 tablet 1   enalapril (VASOTEC) 5 MG tablet Take 1 tablet (5 mg total) by mouth daily. 60 tablet 2   FARXIGA 10 MG TABS tablet Take 10 mg by mouth daily.     furosemide (LASIX) 40 MG tablet Take 1 tablet (40 mg total) by mouth daily. 90 tablet 3   gabapentin (NEURONTIN) 300 MG capsule Take 300 mg by mouth 3 (three) times daily.     insulin glargine (LANTUS) 100 UNIT/ML Solostar Pen Inject 80 Units into the skin 2 (two) times daily.     insulin lispro (HUMALOG) 100 UNIT/ML injection Inject 10-15 Units into the skin 3 (three) times daily before  meals. sliding scale prn     naloxone (NARCAN) nasal spray 4 mg/0.1 mL Place 4 mg into the nose as needed.     nitroGLYCERIN (NITROSTAT) 0.4 MG SL tablet Place 0.4 mg under the tongue every 5 (five) minutes as needed for chest pain.     oxyCODONE-acetaminophen (PERCOCET) 10-325 MG tablet Take 1 tablet by mouth 4 (four) times daily.     pantoprazole (PROTONIX) 40 MG tablet Take 40 mg by mouth daily.     potassium chloride (KLOR-CON) 10 MEQ tablet Take 1 tablet (10 mEq total) by mouth daily. 30 tablet 6   rosuvastatin (CRESTOR) 40 MG tablet Take 1 tablet (40 mg total) by mouth daily. 90 tablet 3   simvastatin (ZOCOR) 80 MG tablet Take 80 mg by mouth at bedtime.     spironolactone (ALDACTONE) 25 MG tablet TAKE 1/2 TABLET BY MOUTH EVERY DAY 45 tablet 2   Vitamin D, Ergocalciferol, (DRISDOL) 1.25 MG (50000 UNIT) CAPS capsule Take 50,000 Units by mouth every Saturday.     No current facility-administered medications for this visit.    Allergies  Allergen Reactions   Hydrocodone Hives and Nausea And Vomiting   Metformin     Other reaction(s): Other (See Comments) Water blisters between fingers    Social History   Socioeconomic History   Marital status: Divorced    Spouse name: Not on file   Number of children: Not on file   Years of education: Not on file   Highest education level: Not on file  Occupational History    Employer: OTHER  Tobacco Use   Smoking status: Former    Packs/day: 0.25    Years: 25.00    Pack years: 6.25    Types: Cigarettes   Smokeless tobacco: Never  Vaping Use   Vaping Use: Never used  Substance and Sexual Activity   Alcohol use: Yes    Alcohol/week: 4.0 standard drinks    Types: 4 Cans of beer per week   Drug use: No   Sexual activity: Yes  Other Topics Concern   Not on file  Social History Narrative   Disabled - no longer works   Social Determinants of Radio broadcast assistant Strain: Not on  file  Food Insecurity: Not on file   Transportation Needs: Not on file  Physical Activity: Not on file  Stress: Not on file  Social Connections: Not on file  Intimate Partner Violence: Not on file    Family History  Problem Relation Age of Onset   Cancer Father        Lung   Diabetes Mother    COPD Mother    Cancer Maternal Uncle        Colon    Review of Systems:  As stated in the HPI and otherwise negative.   There were no vitals taken for this visit.  Physical Examination:  General: Well developed, well nourished, NAD  HEENT: OP clear, mucus membranes moist  SKIN: warm, dry. No rashes. Neuro: No focal deficits  Musculoskeletal: Muscle strength 5/5 all ext  Psychiatric: Mood and affect normal  Neck: No JVD, no carotid bruits, no thyromegaly, no lymphadenopathy.  Lungs:Clear bilaterally, no wheezes, rhonci, crackles Cardiovascular: Regular rate and rhythm. No murmurs, gallops or rubs. Abdomen:Soft. Bowel sounds present. Non-tender.  Extremities: No lower extremity edema. Pulses are 2 + in the bilateral DP/PT.  EKG:  EKG is *** ordered today. The ekg ordered today demonstrates   Recent Labs: 06/28/2021: BUN 16; Creatinine, Ser 1.10; Hemoglobin 18.7; Potassium 3.7; Sodium 140   Lipid Panel Lipid Panel     Component Value Date/Time   CHOL 157 06/03/2020 1144   TRIG 265 (H) 06/03/2020 1144   HDL 30 (L) 06/03/2020 1144   CHOLHDL 5.2 (H) 06/03/2020 1144   CHOLHDL 5.9 05/11/2020 1438   VLDL 33 05/11/2020 1438   LDLCALC 83 06/03/2020 1144   LABVLDL 44 (H) 06/03/2020 1144     Wt Readings from Last 3 Encounters:  06/28/21 228 lb (103.4 kg)  06/13/21 230 lb (104.3 kg)  04/11/21 231 lb 3.2 oz (104.9 kg)     Other studies Reviewed: Additional studies/ records that were reviewed today include: . Review of the above records demonstrates:    Assessment and Plan:   1. CAD s/p CABG without angina: No angina. Will continue *** ASA, statin and beta blocker. *** make sure he is now off of Plavix post PV  procedure.   2. PAD: Followed in [facility].   3. Tobacco abuse: He has stopped smoking ***  4. Hyperlipidemia: Lipids followed in primary care. LDL near goal in 2021. Continue statin.   5. Chronic systolic CHF/Ischemic cardiomyopathy: LVEF 50-55%  by echo in October 2021. Continue beta blocker, Ace-inhibitor, aldactone and Lasix.   6. History of LV thrombus: Continue Eliquis  7. Atrial fibrillation: He is in sinus today. Continue beta blocker and Eliquis.   Current medicines are reviewed at length with the patient today.  The patient does not have concerns regarding medicines.  The following changes have been made:  no change  Labs/ tests ordered today include:   No orders of the defined types were placed in this encounter.   Disposition:   FU with me in 3  months  Signed, [name], MD 02/03/2022 8:38 AM    [facility] [place], [place], [place]  [address] Phone: [phone]; Fax: [phone]

## 2022-02-20 ENCOUNTER — Telehealth: Payer: Self-pay | Admitting: Cardiovascular Disease

## 2022-02-20 NOTE — Telephone Encounter (Signed)
Pt c/o Shortness Of Breath: STAT if SOB developed within the last 24 hours or pt is noticeably SOB on the phone ? ?1. Are you currently SOB (can you hear that pt is SOB on the phone)?  ?No  ? ?2. How long have you been experiencing SOB?  ?The past 1 month ? ?3. Are you SOB when sitting or when up moving around?  ?When up and moving  ? ?4. Are you currently experiencing any other symptoms?  ?Patient has been experiencing pain in his right arm on and off for the past month. He takes nitro and the pain usually subsides.  ?

## 2022-02-20 NOTE — Telephone Encounter (Signed)
I spoke w the patient.  He called today because he has been having right arm pain and SOB with exertion.  He notices this when he is working outside.  Going on for about 1 month.  This is what has happened with past MI.  The symptoms resolve with SL ntg.   He has no other symptoms.  I offered an appointment tomorrow but his wife has to work and she is his driver.   He will come and see Dr. Jackalyn Lombard (DOD) on 02/22/22.   ? ?I adv to avoid exertional activities for now, especially things that he knows will bring on this symptoms.  Adv that if symptoms occur at rest or are not relieved by SL ntg then he should call EMS to be evaluated and taken to the ER.  He voices understanding and agreement with this plan. ?

## 2022-02-21 ENCOUNTER — Ambulatory Visit: Payer: Medicaid Other | Admitting: Cardiology

## 2022-02-21 NOTE — Progress Notes (Signed)
?Cardiology Office Note:   ? ?Date:  02/22/2022  ? ?ID:  Ryan [name], DOB 1966-07-28, MRN [id number] ? ?PCP:  Nolene Ebbs, MD  ? ?[facility] Providers ?Primary cardiologist: [name] MD ?Referring MD: Nolene Ebbs, MD  ? ?Chief Complaint/Reason for Referral: Chest pain ? ?ASSESSMENT:   ? ?1. Angina pectoris ([place])   ?2. Dyspnea, unspecified type   ?3. S/P CABG x 3   ?4. Type 2 diabetes mellitus with complication, with long-term current use of insulin ([place])   ?5. Hypertension associated with diabetes ([place])   ?6. Hyperlipidemia associated with type 2 diabetes mellitus ([place])   ?7. PAD (peripheral artery disease) ([place])   ?8. Anticoagulated   ?9. On amiodarone therapy   ? ? ?PLAN:   ? ?In order of problems listed above: ?1.  I had a long conversation with the patient about potential options including proceeding to cardiac catheterization or medical optimization.  He has had worrisome episode which included the use of multiple nitroglycerin.  He would like to try medicines first as he is reluctant to undergo another invasive procedure..  We will start Imdur 30 mg at bedtime and increase his Coreg to 25 mg twice daily.  I will have him follow-up with [place] next week to see how this goes.  I told him to seek urgent medical care if he requires multiple nitroglycerin to alleviate chest discomfort. ?2.  Will check echocardiogram for wall motion; if better consider stopping Eliquis and restarting ASA.  Ejection fraction has normalized so we will stop spironolactone. ?3.   Follow-up echocardiogram for need for Eliquis.  If Eliquis is no longer needed consider restart aspirin monotherapy. ?4.  Continue Farxiga, simvastatin, anticoagulant or aspirin pending on echo, and enalapril.   ?5.  Goal blood pressures less than 130/80 mmHg; continue enalapril.  Diastolic blood pressure is mildly elevated; increase Coreg as detailed above. ?6.  Check lipid panel later this year.  Goal LDL is less than 70. ?7.  Continue  strict blood pressure control.  Has been more than 6 months since balloon angioplasty of common femoral so we will DC Plavix.  Per vascular surgery procedure note July 2022 only needed Plavix for 1 month. ?8.  Echocardiogram will inform the need for continued anticoagulation due to anterior wall motion abnormality.  Since he is on chronic anticoagulation, we will stop aspirin 81 mg (and plavix see above #7). ?9.  He is on amiodarone which is a holdover from postoperative atrial fibrillation.  He is in normal sinus rhythm now and has no reported palpitations.  We will stop amiodarone.  Increasing beta-blocker as detailed above.   ? ?Dispo:  Return in about 1 week (around 03/01/2022).  ? ?  ? ?Medication Adjustments/Labs and Tests Ordered: ?Current medicines are reviewed at length with the patient today.  Concerns regarding medicines are outlined above.  ? ?Tests Ordered: ?Orders Placed This Encounter  ?Procedures  ? EKG 12-Lead  ? ? ?Medication Changes: ?No orders of the defined types were placed in this encounter. ? ? ?History of Present Illness:   ? ?FOCUSED PROBLEM LIST:   ?Coronary artery disease status post CABG consisting of LIMA to LAD, vein graft to diagonal, and vein graft right coronary in 2021 with multiple prior PCI's complicated by recurrent in-stent restenosis as detailed below: ?S/p anterior STEMI in 1/13 c/b cardiac arrest >> DES to LAD; OM1 w 100 CTO ?Cath 11/17: LAD severe ISR >> cutting balloon POBA ?S/p anterior STEMI 8/18 [facility]) >> PCI: DES  to LAD prox to old stent, POBA to ost Dx ?Cath 05/2019: mid LAD severe ISR >> PCI:  Cutting balloon POBA ?Peripheral Arterial Disease (Dr. 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 s/p aorto-bifem bypass in 2014 ?S/p L carotid to subclavian bypass in 5/14 ?R fem to AK popliteal bypass and R fem endarterectomy  ?S/p drug-eluting balloon angioplasty R common femoral 7/22 ?Hyperlipidemia  ?HFimpEF (heart failure with improved ejection fraction);  Ischemic CM ?Post MI EF 35; FU echocardiogram with normal EF        ?EF 8/18: 40 ?EF 05/2019: 35-45 ?Echocardiogram 05/2020: EF 30-35 ?Echocardiogram 10/21: EF 50-55 ?LV apical thrombus ?Noted on echocardiogram post CABG 05/2020 >> Apixaban ?Post op AFib ?Amiodarone  ?Diabetes mellitus, insulin dependent  ?Prior Tobacco abuse, quit 2021 ?On chronic anticoagulation due to continued hypokinetic anterior wall on echocardiogram 2021 ? ?The patient is a 56 y.o. male with the indicated medical history here for for expedited outpatient consultation due to chest pain.  The patient called our office due to the development of angina.  I am seeing the patient as the doctor of the day for Dr. Angelena Form.  The patient tells me about a month ago he developed exertional angina.  His anginal equivalent seems to be right arm pain.  This would occur when he would mow his lawn.  He would stop the pain would go away.  Since that time he has required nitroglycerin every so often to alleviate the pain.  There was one episode where he needed 3 nitroglycerin to alleviate the arm pain.  He denies any severe bleeding, presyncope, syncope, palpitations, paroxysmal nocturnal dyspnea, peripheral edema, or orthopnea. ? ?   ?  ?Previous Medical History: ?Past Medical History:  ?Diagnosis Date  ? Acute myocardial infarction of other anterior wall, subsequent episode of care 12/12/2011  ? Anal fissure   ? Arthritis   ? back  ? Atherosclerosis of native artery of both lower extremities with intermittent claudication ([place]) 04/01/2018  ? Bruises easily   ? d/t being on Effient  ? CAD (coronary artery disease)   ? A. Acute Ant STEMI 12/12/2011   ? Cardiomyopathy secondary 12/26/2011  ? Chronic systolic CHF (congestive heart failure) ([place])   ? ischemic CM // Echo 6/21: EF 30-35  //  Echocardiogram 10/21: LV apical thrombus resolved, EF 50-55, ant and apical, ant-lat HK, trivial MR, trivial AI  ? Chronic total occlusion of artery of the extremities ([place]) 02/26/2012   ? Claudication ([place]) 12/26/2011  ? Coronary atherosclerosis of native coronary artery   ? a. ant STEMI with cardiac arrest 2013 s/p DES to mLAD. b. [place] 10/2016 s/p PTCA to mLAD.  ? Diabetes mellitus without complication ([place])   ? Essential hypertension 10/23/2016  ? GERD (gastroesophageal reflux disease)   ? "takes tums"  ? History of blood transfusion   ? no abnormal reaction noted  ? History of kidney stones   ? Hypertension   ? Ischemic cardiomyopathy   ? a. EF 35% in 02/2012 at time of acute MI, improved to normal on subsequent imaging.  ? MI (myocardial infarction) ([place])   ? AMI [id number] - complicated by VT/Tosades  ? Mixed hyperlipidemia   ? Numbness and tingling of right arm 03/31/2013  ? Occlusion and stenosis of carotid artery without mention of cerebral infarction 08/26/2012  ? PAD (peripheral artery disease) ([place])   ? followed by Vascular - aortobifem bypass 01/2013 & left carotid-subclavian artery bypass 04/2013  ? Peripheral vascular disease,  unspecified ([place]) 12/16/2012  ? PVD (peripheral vascular disease) ([place]) 03/31/2013  ? Rectal polyp   ? Subclavian steal syndrome 08/11/2013  ? Uncontrolled diabetes mellitus 10/23/2016  ? Wears partial dentures   ? upper  ? ? ? ?Current Medications: ?Current Meds  ?Medication Sig  ? amiodarone (PACERONE) 200 MG tablet Take 1 tablet (200 mg total) by mouth daily.  ? aspirin EC 81 MG tablet Take 1 tablet (81 mg total) by mouth daily.  ? cetirizine (ZYRTEC) 10 MG tablet Take 10 mg by mouth daily.  ? clopidogrel (PLAVIX) 75 MG tablet Take 1 tablet (75 mg total) by mouth daily.  ? cyclobenzaprine (FLEXERIL) 10 MG tablet Take 1 tablet (10 mg total) by mouth 2 (two) times daily as needed for muscle spasms.  ? diazepam (VALIUM) 5 MG tablet Take 5 mg by mouth daily as needed for anxiety.  ? ELIQUIS 5 MG TABS tablet TAKE 1 TABLET BY MOUTH 2 TIMES A DAY  ? enalapril (VASOTEC) 5 MG tablet Take 1 tablet (5 mg total) by mouth daily.  ? FARXIGA 10 MG TABS tablet Take 10 mg by mouth daily.  ?  furosemide (LASIX) 40 MG tablet Take 1 tablet (40 mg total) by mouth daily.  ? gabapentin (NEURONTIN) 300 MG capsule Take 300 mg by mouth 3 (three) times daily.  ? insulin glargine (LANTUS) 100 UNIT/ML [facility]os

## 2022-02-22 ENCOUNTER — Other Ambulatory Visit: Payer: Self-pay

## 2022-02-22 ENCOUNTER — Ambulatory Visit: Payer: Medicaid Other | Admitting: Internal Medicine

## 2022-02-22 ENCOUNTER — Encounter: Payer: Self-pay | Admitting: Internal Medicine

## 2022-02-22 VITALS — BP 120/86 | HR 73 | Ht 69.0 in | Wt 230.0 lb

## 2022-02-22 DIAGNOSIS — Z7901 Long term (current) use of anticoagulants: Secondary | ICD-10-CM

## 2022-02-22 DIAGNOSIS — I152 Hypertension secondary to endocrine disorders: Secondary | ICD-10-CM

## 2022-02-22 DIAGNOSIS — Z951 Presence of aortocoronary bypass graft: Secondary | ICD-10-CM

## 2022-02-22 DIAGNOSIS — R0600 Dyspnea, unspecified: Secondary | ICD-10-CM

## 2022-02-22 DIAGNOSIS — E1159 Type 2 diabetes mellitus with other circulatory complications: Secondary | ICD-10-CM

## 2022-02-22 DIAGNOSIS — I739 Peripheral vascular disease, unspecified: Secondary | ICD-10-CM

## 2022-02-22 DIAGNOSIS — E785 Hyperlipidemia, unspecified: Secondary | ICD-10-CM

## 2022-02-22 DIAGNOSIS — I209 Angina pectoris, unspecified: Secondary | ICD-10-CM

## 2022-02-22 DIAGNOSIS — E118 Type 2 diabetes mellitus with unspecified complications: Secondary | ICD-10-CM | POA: Diagnosis not present

## 2022-02-22 DIAGNOSIS — Z79899 Other long term (current) drug therapy: Secondary | ICD-10-CM

## 2022-02-22 DIAGNOSIS — Z794 Long term (current) use of insulin: Secondary | ICD-10-CM

## 2022-02-22 DIAGNOSIS — E1169 Type 2 diabetes mellitus with other specified complication: Secondary | ICD-10-CM

## 2022-02-22 MED ORDER — ISOSORBIDE MONONITRATE ER 30 MG PO TB24: 30.0000 mg | ORAL_TABLET | Freq: Every day | ORAL | 3 refills | Status: DC

## 2022-02-22 MED ORDER — CARVEDILOL 25 MG PO TABS: 25.0000 mg | ORAL_TABLET | Freq: Two times a day (BID) | ORAL | 3 refills | Status: DC

## 2022-02-22 NOTE — Patient Instructions (Addendum)
Medication Instructions:  ?Your physician has recommended you make the following change in your medication:  ?1.) start isosorbide (IMDUR) 30 mg - take one tablet daily at bedtime ?2.) increase carvedilol (Coreg) to 25 mg - take one tablet twice a day ?3.) stop aspirin ?4.) stop spironolactone ?5.) stop amiodarone ?6.) stop clopidogrel (Plavix) ? ?*If you need a refill on your cardiac medications before your next appointment, please call your pharmacy* ? ? ?Lab Work: ?none ? ? ?Testing/Procedures: ?Your physician has requested that you have an echocardiogram. Echocardiography is a painless test that uses sound waves to create images of your heart. It provides your doctor with information about the size and shape of your heart and how well your heart?s chambers and valves are working. This procedure takes approximately one hour. There are no restrictions for this procedure. ? ? ?Follow-Up: ?At [facility], you and your health needs are our priority.  As part of our continuing mission to provide you with exceptional heart care, we have created designated Provider Care Teams.  These Care Teams include your primary Cardiologist (physician) and Advanced Practice Providers (APPs -  Physician Assistants and Nurse Practitioners) who all work together to provide you with the care you need, when you need it. ? ? ?Your next appointment:   ?1 week(s) ? ?The format for your next appointment:   ?In Person ? ?Provider:   ?[name], MD   ? ? ?Other Instructions ?  ?

## 2022-02-24 ENCOUNTER — Telehealth (HOSPITAL_COMMUNITY): Payer: Self-pay | Admitting: Internal Medicine

## 2022-02-24 NOTE — Telephone Encounter (Signed)
Patient cancelled echocardiogram stating that he had last week at the [facility] that was ordered by Pain Management Dr. He states they will be sending the echo results by fax.  Order will be removed from the ECHO WQ.  ?

## 2022-02-27 ENCOUNTER — Other Ambulatory Visit (HOSPITAL_COMMUNITY): Payer: Medicaid Other

## 2022-02-28 ENCOUNTER — Other Ambulatory Visit: Payer: Self-pay | Admitting: Cardiovascular Disease

## 2022-02-28 DIAGNOSIS — I480 Paroxysmal atrial fibrillation: Secondary | ICD-10-CM

## 2022-02-28 DIAGNOSIS — I4891 Unspecified atrial fibrillation: Secondary | ICD-10-CM

## 2022-02-28 DIAGNOSIS — I513 Intracardiac thrombosis, not elsewhere classified: Secondary | ICD-10-CM

## 2022-02-28 NOTE — Telephone Encounter (Signed)
Eliquis 5mg  refill request received. Patient is 56 years old, weight-104.3kg, Crea-1.10 on 06/28/2021, Diagnosis-LV Apical Thrombus (2021) & post op Afib, and last seen by Dr. 11[birth year]1972 on 02/22/2022. Dose is appropriate based on dosing criteria. Will send in refill to requested pharmacy.   ? ?Per 02/22/2022 OV note from Dr. 02/24/2022 pt may be stopping Eliquis vs continuing: Will check echocardiogram for wall motion; if better consider stopping Eliquis and restarting ASA.  Ejection fraction has normalized so we will stop spironolactone. ?Follow-up echocardiogram for need for Eliquis.  If Eliquis is no longer needed consider restart aspirin monotherapy. ?

## 2022-03-01 NOTE — Progress Notes (Deleted)
? ?No chief complaint on file. ? ?History of Present Illness: 56 yo male with history of ongoing tobacco abuse, CAD, HLD, paroxysmal atrial fibrillation, PAD, HLD, ischemic cardiomyopathy and insulin dependent diabetes mellitus who is here today for cardiac follow up. He had an anterior STEMI in January 2013 with cardiac arrest and was found to have an occluded mid LAD treated with a drug eluting stent. The third OM branch was chronically occluded and the RCA had mild disease. His post MI LVEF was 35% but this was found to be normal on follow up echo. Cardiac cath in November 2017 with severe stent restenosis in the LAD, treated with a cutting balloon angioplasty. He was admitted to [facility] in August 2018 with an anterior STEMI. His LAD was occluded proximal to the old stent and this was treated with a drug eluting stent. Angioplasty performed on the ostial Diagonal branch. LVEF was 40% following the MI. He had unstable angina in June 2020 and repeat cardiac cath showed severe restenosis in the mid LAD stented segment which was treated with cutting balloon angioplasty. He also had ostial stenosis of the diagonal branch (jailed by LAD stents), mild to moderate non-obstructive disease in the mid RCA and moderate LV systolic dysfunction with anteroapical hypokinesis (c/w findings on echo in 2018 at [facility]). LVEF=35-45%. Cardiac cath 04/22/20 in setting of unstable angina with progression of disease in the LAD and RCA. He underwent 3V CABG 05/25/20. (LIMA to LAD, SVG to Diagonal and SVG to RCA). His post-operative course was complicated by atrial fibrillation and he was started on amiodarone. Echo with LVEF=30-35% with apical thrombus. He was discharged on Eliquis with a Lifevest. Echo October 2021 with LVEF=50-55% with no significant valve disease. No LV thrombus present. He was seen in our office 02/22/22 by Dr. Ali Lowe and reported arm pain with exertion, resolved with NTG. Imdur 30 mg was  added and Coreg was increased to 25 mg po BID. Amiodarone was stopped. Echo was ordered.  ? ?His PAD is followed in [facility] by Dr. Trula Slade. In 2013 he was found to have occlusion of his distal aorta and left subclavian artery. He underwent aortobifemoral bypass in February 2014 and left carotid to subclavian bypass in May 2014.  In April 2019, he underwent right femoral to AK popliteal bypass with saphenous vein and right femoral endarterectomy. In July 2022 Dr. Trula Slade treated the right common femoral artery stenosis with drug coated balloon angioplasty.  ? ?He is here today for follow up. The patient denies any chest pain, dyspnea, palpitations, lower extremity edema, orthopnea, PND, dizziness, near syncope or syncope.  ?   ?Primary Care Physician: Nolene Ebbs, MD ? ?Past Medical History:  ?Diagnosis Date  ? Acute myocardial infarction of other anterior wall, subsequent episode of care 12/12/2011  ? Anal fissure   ? Arthritis   ? back  ? Atherosclerosis of native artery of both lower extremities with intermittent claudication ([place]) 04/01/2018  ? Bruises easily   ? d/t being on Effient  ? CAD (coronary artery disease)   ? A. Acute Ant STEMI 12/12/2011   ? Cardiomyopathy secondary 12/26/2011  ? Chronic systolic CHF (congestive heart failure) ([place])   ? ischemic CM // Echo 6/21: EF 30-35  //  Echocardiogram 10/21: LV apical thrombus resolved, EF 50-55, ant and apical, ant-lat HK, trivial MR, trivial AI  ? Chronic total occlusion of artery of the extremities ([place]) 02/26/2012  ? Claudication ([place]) 12/26/2011  ? Coronary atherosclerosis of  native coronary artery   ? a. ant STEMI with cardiac arrest 2013 s/p DES to mLAD. b. [place] 10/2016 s/p PTCA to mLAD.  ? Diabetes mellitus without complication ([place])   ? Essential hypertension 10/23/2016  ? GERD (gastroesophageal reflux disease)   ? "takes tums"  ? History of blood transfusion   ? no abnormal reaction noted  ? History of kidney stones   ? Hypertension   ? Ischemic cardiomyopathy    ? a. EF 35% in 02/2012 at time of acute MI, improved to normal on subsequent imaging.  ? MI (myocardial infarction) ([place])   ? AMI [id number] - complicated by VT/Tosades  ? Mixed hyperlipidemia   ? Numbness and tingling of right arm 03/31/2013  ? Occlusion and stenosis of carotid artery without mention of cerebral infarction 08/26/2012  ? PAD (peripheral artery disease) ([place])   ? followed by Vascular - aortobifem bypass 01/2013 & left carotid-subclavian artery bypass 04/2013  ? Peripheral vascular disease, unspecified ([place]) 12/16/2012  ? PVD (peripheral vascular disease) ([place]) 03/31/2013  ? Rectal polyp   ? Subclavian steal syndrome 08/11/2013  ? Uncontrolled diabetes mellitus 10/23/2016  ? Wears partial dentures   ? upper  ? ? ?Past Surgical History:  ?Procedure Laterality Date  ? ABDOMINAL AORTOGRAM W/LOWER EXTREMITY N/A 06/26/2017  ? Procedure: Abdominal Aortogram w/Lower Extremity;  Surgeon: Serafina Mitchell, MD;  Location: [facility];  Service: Cardiovascular;  Laterality: N/A;  ? ABDOMINAL AORTOGRAM W/LOWER EXTREMITY N/A 06/28/2021  ? Procedure: ABDOMINAL AORTOGRAM W/LOWER EXTREMITY;  Surgeon: Serafina Mitchell, MD;  Location: [place] [facility];  Service: Cardiovascular;  Laterality: N/A;  ? ANAL FISSURECTOMY    ? AORTA - BILATERAL FEMORAL ARTERY BYPASS GRAFT N/A 01/16/2013  ? Procedure: AORTA BIFEMORAL BYPASS GRAFT;  Surgeon: Serafina Mitchell, MD;  Location: [facility];  Service: Vascular;  Laterality: N/A;  ? APPENDECTOMY    ? CARDIAC CATHETERIZATION N/A 10/23/2016  ? Procedure: Left Heart Cath and Coronary Angiography;  Surgeon: Burnell Blanks, MD;  Location: [place] [facility];  Service: Cardiovascular;  Laterality: N/A;  ? CARDIAC CATHETERIZATION N/A 10/23/2016  ? Procedure: Coronary Balloon Angioplasty;  Surgeon: Burnell Blanks, MD;  Location: [place] [facility];  Service: Cardiovascular;  Laterality: N/A;  ? CAROTID-SUBCLAVIAN BYPASS GRAFT Left 04/03/2013  ? Procedure: BYPASS GRAFT CAROTID-SUBCLAVIAN;   Surgeon: Serafina Mitchell, MD;  Location: [facility];  Service: Vascular;  Laterality: Left;  ? CIRCUMCISION N/A 12/15/2015  ? Procedure: CIRCUMCISION ADULT;  Surgeon: Cleon Gustin, MD;  Location: [facility];  Service: Urology;  Laterality: N/A;  ? COLONOSCOPY    ? CORONARY ANGIOPLASTY    ? stent placed Dec 12, 2011 and 2018  ? CORONARY ARTERY BYPASS GRAFT N/A 05/05/2020  ? Procedure: CORONARY ARTERY BYPASS GRAFTING (CABG) x Three, Using Left internal Mammary Artery and Left Leg greater saphenous vein harvested endoscopically;  Surgeon: Grace Isaac, MD;  Location: [place] [facility];  Service: Open Heart Surgery;  Laterality: N/A;  ? CORONARY BALLOON ANGIOPLASTY N/A 05/23/2019  ? Procedure: CORONARY BALLOON ANGIOPLASTY;  Surgeon: Burnell Blanks, MD;  Location: [place] [facility];  Service: Cardiovascular;  Laterality: N/A;  ? DIAGNOSTIC LAPAROSCOPY    ? ENDARTERECTOMY FEMORAL Right 03/13/2018  ? Procedure: FEMORAL ENDARTERECTOMY RIGHT;  Surgeon: Serafina Mitchell, MD;  Location: [facility];  Service: Vascular;  Laterality: Right;  ? FEMORAL-POPLITEAL BYPASS GRAFT Right 03/13/2018  ? Procedure: FEMORAL-POPLITEAL ARTERY BYPASS WITH NON-REVERSE VEIN RIGHT;  Surgeon: Serafina Mitchell,  MD;  Location: [facility];  Service: Vascular;  Laterality: Right;  ? GROIN DISSECTION Right 03/13/2018  ? Procedure: RE-DO COMMON FEMORAL ARTERY EXPOSURE;  Surgeon: Serafina Mitchell, MD;  Location[facility];  Service: Vascular;  Laterality: Right;  ? I & D EXTREMITY Left 04/18/2013  ? Procedure: debridement of left neck lymphocele;  Surgeon: Serafina Mitchell, MD;  Location: [facility] OR;  Service: Vascular;  Laterality: Left;  I and D of left neck  ? I & D EXTREMITY Right 11/21/2014  ? Procedure: IRRIGATION AND DEBRIDEMENT RIGHT HAND;  Surgeon: Roseanne Kaufman, MD;  Location: [place];  Service: Orthopedics;  Laterality: Right;  ? INTRAVASCULAR PRESSURE WIRE/FFR STUDY N/A 04/22/2020  ? Procedure: INTRAVASCULAR PRESSURE WIRE/FFR STUDY;  Surgeon: Burnell Blanks, MD;   Location: [place] [facility];  Service: Cardiovascular;  Laterality: N/A;  ? LEFT HEART CATH AND CORONARY ANGIOGRAPHY N/A 05/23/2019  ? Procedure: LEFT HEART CATH AND CORONARY ANGIOGRAPHY;  Surgeon: Roe Rutherford

## 2022-03-02 ENCOUNTER — Telehealth: Payer: Self-pay | Admitting: *Deleted

## 2022-03-02 ENCOUNTER — Ambulatory Visit: Payer: Medicaid Other | Admitting: Cardiovascular Disease

## 2022-03-02 ENCOUNTER — Other Ambulatory Visit (HOSPITAL_COMMUNITY): Payer: Medicaid Other

## 2022-03-02 NOTE — Telephone Encounter (Signed)
Called to check on patient's symptoms.  He had visit last week w DOD.  Medication was adjusted and he was to follow up this morning w Dr. [name] but did not show for appointment.  I left a message for him checking on how he's feeling and to call back and reschedule his appointment with Dr. [name] or an APP. ?

## 2022-03-03 NOTE — Telephone Encounter (Signed)
Patient returned call.  He stated he was going to pick up medication today.  I schedule him for 5/8 with Dayna Dunn.  ?

## 2022-03-15 ENCOUNTER — Other Ambulatory Visit: Payer: Self-pay

## 2022-03-15 ENCOUNTER — Encounter (HOSPITAL_COMMUNITY): Payer: Self-pay

## 2022-03-15 ENCOUNTER — Emergency Department (HOSPITAL_COMMUNITY): Payer: Medicaid Other

## 2022-03-15 ENCOUNTER — Emergency Department (HOSPITAL_COMMUNITY)
Admission: EM | Admit: 2022-03-15 | Discharge: 2022-03-16 | Discharge status: Against Medical Advice | Payer: Medicaid Other | Attending: Emergency Medicine | Admitting: Emergency Medicine

## 2022-03-15 DIAGNOSIS — R0602 Shortness of breath: Secondary | ICD-10-CM | POA: Insufficient documentation

## 2022-03-15 DIAGNOSIS — Z5321 Procedure and treatment not carried out due to patient leaving prior to being seen by health care provider: Secondary | ICD-10-CM | POA: Insufficient documentation

## 2022-03-15 DIAGNOSIS — R531 Weakness: Secondary | ICD-10-CM | POA: Diagnosis not present

## 2022-03-15 DIAGNOSIS — M79601 Pain in right arm: Secondary | ICD-10-CM | POA: Insufficient documentation

## 2022-03-15 LAB — BASIC METABOLIC PANEL WITH GFR
Anion gap: 11 (ref 5–15)
BUN: 21 mg/dL — ABNORMAL HIGH (ref 6–20)
CO2: 24 mmol/L (ref 22–32)
Calcium: 8.8 mg/dL — ABNORMAL LOW (ref 8.9–10.3)
Chloride: 97 mmol/L — ABNORMAL LOW (ref 98–111)
Creatinine, Ser: 1.42 mg/dL — ABNORMAL HIGH (ref 0.61–1.24)
GFR, Estimated: 58 mL/min — ABNORMAL LOW (ref >60)
Glucose, Bld: 391 mg/dL — ABNORMAL HIGH (ref 70–99)
Potassium: 4.1 mmol/L (ref 3.5–5.1)
Sodium: 132 mmol/L — ABNORMAL LOW (ref 135–145)

## 2022-03-15 LAB — CBC
HCT: 48.3 % (ref 39.0–52.0)
Hemoglobin: 16.1 g/dL (ref 13.0–17.0)
MCH: 30.9 pg (ref 26.0–34.0)
MCHC: 33.3 g/dL (ref 30.0–36.0)
MCV: 92.7 fL (ref 80.0–100.0)
Platelets: 179 K/uL (ref 150–400)
RBC: 5.21 MIL/uL (ref 4.22–5.81)
RDW: 14.4 % (ref 11.5–15.5)
WBC: 8.3 K/uL (ref 4.0–10.5)
nRBC: 0 % (ref 0.0–0.2)

## 2022-03-15 LAB — TROPONIN I (HIGH SENSITIVITY): Troponin I (High Sensitivity): 31 ng/L — ABNORMAL HIGH (ref <18)

## 2022-03-15 MED ORDER — ASPIRIN 81 MG PO CHEW: 324.0000 mg | CHEWABLE_TABLET | Freq: Once | ORAL | Status: DC

## 2022-03-15 NOTE — ED Triage Notes (Signed)
Pt arrived via POV for right arm painx2 wks. Pt states he has a heart hx and it's his arm that ever hurts and not his chest. Pt states has SOB. Pt denies N/V. Pt states took nitrox5 today. Pt just took one a couple mins ago. Pt states the nitro helps relieve the pain some. ?

## 2022-03-15 NOTE — ED Notes (Signed)
PT called for vitals X3 with no response after conducting a vitals sweep  ?

## 2022-03-15 NOTE — ED Provider Triage Note (Signed)
Emergency Medicine Provider Triage Evaluation Note ? ?Ryan [name] , a 56 y.o. male  was evaluated in triage.  Pt complains of R arm pain. ? ?Review of Systems  ?Positive: R arm pain, weakness, sob ?Negative: Cp, fever, cough, injury ? ?Physical Exam  ?BP 99/71   Pulse 94   Temp 98 ?F (36.7 ?C) (Oral)   Resp 20   Ht 5\' 9"  (1.753 m)   Wt 104.3 kg   SpO2 94%   BMI 33.96 kg/m?  ?Gen:   Awake, no distress   ?Resp:  Normal effort  ?MSK:   Moves extremities without difficulty  ?Other:   ? ?Medical Decision Making  ?Medically screening exam initiated at 3:37 PM.  Appropriate orders placed.  Timur Nibert was informed that the remainder of the evaluation will be completed by another provider, this initial triage assessment does not replace that evaluation, and the importance of remaining in the ED until their evaluation is complete. ? ?Pt has intermittent R arm pain x 2 weeks improves with nitro, but today pain persists despite taking 5 nitros.  Concerns for unstable angina ?  ?[name], PA-C ?03/15/22 1540 ? ?

## 2022-03-24 ENCOUNTER — Ambulatory Visit (HOSPITAL_BASED_OUTPATIENT_CLINIC_OR_DEPARTMENT_OTHER): Payer: Medicaid Other | Admitting: Family

## 2022-03-27 ENCOUNTER — Telehealth: Payer: Self-pay | Admitting: Cardiovascular Disease

## 2022-03-27 NOTE — Telephone Encounter (Signed)
Pt c/o swelling: STAT is pt has developed SOB within 24 hours ? ?If swelling, where is the swelling located? Legs  ? ?How much weight have you gained and in what time span? Doesn't know  ? ?Have you gained 3 pounds in a day or 5 pounds in a week? Unsure  ? ?Do you have a log of your daily weights (if so, list)? No  ? ?Are you currently taking a fluid pill? Yes  ? ?Are you currently SOB? Yes, developed on Thursday/friday morning when released from the hospital  ? ?Have you traveled recently?  No  ? ? ?Patient states he was hospitalized at [facility] on 04/17 for a heart attack. He reports since being released he has been retaining fluid and when pressing on his legs it leaves a indention. Patient has been scheduled for an appt on 05/01 with Dr. [name]. Please advise.  ? ?

## 2022-03-27 NOTE — Telephone Encounter (Signed)
Called patient. ?He was at [facility] for several days.  Had cath, PCI. ? ?Per discharge notes:  ? ?He did however have an episode of pulmonary edema after the cardiac cath which was determined to be due to volume overload. This was corrected with diuresis and subsequent stabilization. Repeat echocardiogram during this admission revealed ejection fraction of 20 to 25%. ? ? ?He reports to me that his legs are swollen and when he presses on them an indention stays.  He is coughing and spitting up phlegm.  He borrowed his sisters O2 and has it on 5L.  His dc info indicates lasix 40 mg daily and no potassium but the patient reports they told him to take the lasix twice a day and he is doing so.    His hosp follow up with Dr. Pearletha Alfred is 04/03/22 at 2:40 pm. ? ?

## 2022-03-27 NOTE — Telephone Encounter (Signed)
Called back to the patient and adv to keep taking meds as ordered.  Adv if symptoms worsen that he should go to the ER to be evaluated.  He voices understanding and agreement. ?

## 2022-03-29 ENCOUNTER — Emergency Department (HOSPITAL_COMMUNITY): Payer: Medicaid Other

## 2022-03-29 ENCOUNTER — Observation Stay (HOSPITAL_COMMUNITY)
Admission: EM | Admit: 2022-03-29 | Discharge: 2022-03-31 | Disposition: A | Discharge status: Home / Self Care | Payer: Medicaid Other | Attending: Cardiology | Admitting: Cardiology

## 2022-03-29 ENCOUNTER — Other Ambulatory Visit: Payer: Self-pay

## 2022-03-29 ENCOUNTER — Encounter (HOSPITAL_COMMUNITY): Payer: Self-pay

## 2022-03-29 DIAGNOSIS — K219 Gastro-esophageal reflux disease without esophagitis: Secondary | ICD-10-CM | POA: Diagnosis present

## 2022-03-29 DIAGNOSIS — Z87442 Personal history of urinary calculi: Secondary | ICD-10-CM

## 2022-03-29 DIAGNOSIS — I2510 Atherosclerotic heart disease of native coronary artery without angina pectoris: Secondary | ICD-10-CM | POA: Diagnosis present

## 2022-03-29 DIAGNOSIS — R55 Syncope and collapse: Secondary | ICD-10-CM

## 2022-03-29 DIAGNOSIS — Z888 Allergy status to other drugs, medicaments and biological substances status: Secondary | ICD-10-CM

## 2022-03-29 DIAGNOSIS — I110 Hypertensive heart disease with heart failure: Secondary | ICD-10-CM | POA: Diagnosis not present

## 2022-03-29 DIAGNOSIS — Z794 Long term (current) use of insulin: Secondary | ICD-10-CM

## 2022-03-29 DIAGNOSIS — I214 Non-ST elevation (NSTEMI) myocardial infarction: Secondary | ICD-10-CM | POA: Diagnosis present

## 2022-03-29 DIAGNOSIS — I428 Other cardiomyopathies: Secondary | ICD-10-CM | POA: Diagnosis not present

## 2022-03-29 DIAGNOSIS — I5043 Acute on chronic combined systolic (congestive) and diastolic (congestive) heart failure: Principal | ICD-10-CM | POA: Insufficient documentation

## 2022-03-29 DIAGNOSIS — F17210 Nicotine dependence, cigarettes, uncomplicated: Secondary | ICD-10-CM | POA: Insufficient documentation

## 2022-03-29 DIAGNOSIS — I509 Heart failure, unspecified: Principal | ICD-10-CM

## 2022-03-29 DIAGNOSIS — Z9049 Acquired absence of other specified parts of digestive tract: Secondary | ICD-10-CM

## 2022-03-29 DIAGNOSIS — Z951 Presence of aortocoronary bypass graft: Secondary | ICD-10-CM

## 2022-03-29 DIAGNOSIS — R0602 Shortness of breath: Secondary | ICD-10-CM | POA: Diagnosis present

## 2022-03-29 DIAGNOSIS — E782 Mixed hyperlipidemia: Secondary | ICD-10-CM | POA: Insufficient documentation

## 2022-03-29 DIAGNOSIS — I739 Peripheral vascular disease, unspecified: Secondary | ICD-10-CM | POA: Diagnosis present

## 2022-03-29 DIAGNOSIS — Z885 Allergy status to narcotic agent status: Secondary | ICD-10-CM

## 2022-03-29 DIAGNOSIS — I5041 Acute combined systolic (congestive) and diastolic (congestive) heart failure: Secondary | ICD-10-CM | POA: Diagnosis present

## 2022-03-29 DIAGNOSIS — Z7901 Long term (current) use of anticoagulants: Secondary | ICD-10-CM

## 2022-03-29 DIAGNOSIS — Z720 Tobacco use: Secondary | ICD-10-CM | POA: Diagnosis present

## 2022-03-29 DIAGNOSIS — I480 Paroxysmal atrial fibrillation: Secondary | ICD-10-CM | POA: Diagnosis not present

## 2022-03-29 DIAGNOSIS — I252 Old myocardial infarction: Secondary | ICD-10-CM | POA: Diagnosis not present

## 2022-03-29 DIAGNOSIS — I255 Ischemic cardiomyopathy: Secondary | ICD-10-CM | POA: Diagnosis present

## 2022-03-29 DIAGNOSIS — E1169 Type 2 diabetes mellitus with other specified complication: Secondary | ICD-10-CM | POA: Diagnosis present

## 2022-03-29 DIAGNOSIS — R778 Other specified abnormalities of plasma proteins: Secondary | ICD-10-CM

## 2022-03-29 DIAGNOSIS — E118 Type 2 diabetes mellitus with unspecified complications: Secondary | ICD-10-CM | POA: Diagnosis present

## 2022-03-29 DIAGNOSIS — Z79899 Other long term (current) drug therapy: Secondary | ICD-10-CM

## 2022-03-29 DIAGNOSIS — Z7982 Long term (current) use of aspirin: Secondary | ICD-10-CM

## 2022-03-29 DIAGNOSIS — I10 Essential (primary) hypertension: Secondary | ICD-10-CM | POA: Diagnosis present

## 2022-03-29 DIAGNOSIS — E1151 Type 2 diabetes mellitus with diabetic peripheral angiopathy without gangrene: Secondary | ICD-10-CM | POA: Diagnosis present

## 2022-03-29 DIAGNOSIS — Z833 Family history of diabetes mellitus: Secondary | ICD-10-CM

## 2022-03-29 LAB — CBC WITH DIFFERENTIAL/PLATELET
Abs Immature Granulocytes: 0.04 10*3/uL (ref 0.00–0.07)
Basophils Absolute: 0.1 10*3/uL (ref 0.0–0.1)
Basophils Relative: 1 %
Eosinophils Absolute: 0.2 10*3/uL (ref 0.0–0.5)
Eosinophils Relative: 2 %
HCT: 43.1 % (ref 39.0–52.0)
Hemoglobin: 13.8 g/dL (ref 13.0–17.0)
Immature Granulocytes: 0 %
Lymphocytes Relative: 22 %
Lymphs Abs: 2 10*3/uL (ref 0.7–4.0)
MCH: 30.4 pg (ref 26.0–34.0)
MCHC: 32 g/dL (ref 30.0–36.0)
MCV: 94.9 fL (ref 80.0–100.0)
Monocytes Absolute: 1 10*3/uL (ref 0.1–1.0)
Monocytes Relative: 11 %
Neutro Abs: 6 10*3/uL (ref 1.7–7.7)
Neutrophils Relative %: 64 %
Platelets: 256 10*3/uL (ref 150–400)
RBC: 4.54 MIL/uL (ref 4.22–5.81)
RDW: 14.4 % (ref 11.5–15.5)
WBC: 9.4 10*3/uL (ref 4.0–10.5)
nRBC: 0 % (ref 0.0–0.2)

## 2022-03-29 LAB — BRAIN NATRIURETIC PEPTIDE: B Natriuretic Peptide: 625.1 pg/mL — ABNORMAL HIGH (ref 0.0–100.0)

## 2022-03-29 LAB — COMPREHENSIVE METABOLIC PANEL
ALT: 90 U/L — ABNORMAL HIGH (ref 0–44)
AST: 66 U/L — ABNORMAL HIGH (ref 15–41)
Albumin: 3.2 g/dL — ABNORMAL LOW (ref 3.5–5.0)
Alkaline Phosphatase: 203 U/L — ABNORMAL HIGH (ref 38–126)
Anion gap: 10 (ref 5–15)
BUN: 15 mg/dL (ref 6–20)
CO2: 29 mmol/L (ref 22–32)
Calcium: 8.9 mg/dL (ref 8.9–10.3)
Chloride: 98 mmol/L (ref 98–111)
Creatinine, Ser: 1.39 mg/dL — ABNORMAL HIGH (ref 0.61–1.24)
GFR, Estimated: 60 mL/min — ABNORMAL LOW (ref >60)
Glucose, Bld: 84 mg/dL (ref 70–99)
Potassium: 3.7 mmol/L (ref 3.5–5.1)
Sodium: 137 mmol/L (ref 135–145)
Total Bilirubin: 1 mg/dL (ref 0.3–1.2)
Total Protein: 6.9 g/dL (ref 6.5–8.1)

## 2022-03-29 LAB — HEMOGLOBIN A1C
Hgb A1c MFr Bld: 9.6 % — ABNORMAL HIGH (ref 4.8–5.6)
Mean Plasma Glucose: 228.82 mg/dL

## 2022-03-29 LAB — TROPONIN I (HIGH SENSITIVITY)
Troponin I (High Sensitivity): 366 ng/L (ref <18)
Troponin I (High Sensitivity): 430 ng/L (ref <18)

## 2022-03-29 MED ORDER — FUROSEMIDE 10 MG/ML IJ SOLN
60.0000 mg | Freq: Two times a day (BID) | INTRAMUSCULAR | Status: AC
  Administered 2022-03-30 (×2): 60 mg via INTRAVENOUS
  Filled 2022-03-29 (×3): qty 6

## 2022-03-29 MED ORDER — POTASSIUM CHLORIDE ER 10 MEQ PO TBCR
40.0000 meq | EXTENDED_RELEASE_TABLET | Freq: Every day | ORAL | Status: DC
  Administered 2022-03-30 – 2022-03-31 (×2): 40 meq via ORAL
  Filled 2022-03-29 (×4): qty 4

## 2022-03-29 MED ORDER — OXYCODONE HCL 5 MG PO TABS
5.0000 mg | ORAL_TABLET | Freq: Four times a day (QID) | ORAL | Status: DC
  Administered 2022-03-29 – 2022-03-31 (×6): 5 mg via ORAL
  Filled 2022-03-29 (×6): qty 1

## 2022-03-29 MED ORDER — ROSUVASTATIN CALCIUM 20 MG PO TABS
40.0000 mg | ORAL_TABLET | Freq: Every day | ORAL | Status: DC
Start: 2022-03-30 — End: 2022-03-31
  Administered 2022-03-30 – 2022-03-31 (×2): 40 mg via ORAL
  Filled 2022-03-29 (×2): qty 2

## 2022-03-29 MED ORDER — CARVEDILOL 25 MG PO TABS
25.0000 mg | ORAL_TABLET | Freq: Two times a day (BID) | ORAL | Status: DC
  Administered 2022-03-29 – 2022-03-31 (×4): 25 mg via ORAL
  Filled 2022-03-29: qty 1
  Filled 2022-03-29: qty 2
  Filled 2022-03-29 (×2): qty 1

## 2022-03-29 MED ORDER — PANTOPRAZOLE SODIUM 40 MG PO TBEC
40.0000 mg | DELAYED_RELEASE_TABLET | Freq: Every day | ORAL | Status: DC
  Administered 2022-03-30 – 2022-03-31 (×2): 40 mg via ORAL
  Filled 2022-03-29 (×2): qty 1

## 2022-03-29 MED ORDER — AMIODARONE HCL 200 MG PO TABS: 200.0000 mg | ORAL_TABLET | Freq: Every day | ORAL | Status: DC

## 2022-03-29 MED ORDER — OXYCODONE-ACETAMINOPHEN 5-325 MG PO TABS
1.0000 | ORAL_TABLET | Freq: Four times a day (QID) | ORAL | Status: DC
  Administered 2022-03-29 – 2022-03-31 (×6): 1 via ORAL
  Filled 2022-03-29 (×6): qty 1

## 2022-03-29 MED ORDER — SODIUM CHLORIDE 0.9 % IV SOLN: 250.0000 mL | INTRAVENOUS | Status: DC | PRN

## 2022-03-29 MED ORDER — GABAPENTIN 300 MG PO CAPS
300.0000 mg | ORAL_CAPSULE | Freq: Three times a day (TID) | ORAL | Status: DC
  Administered 2022-03-29 – 2022-03-31 (×5): 300 mg via ORAL
  Filled 2022-03-29 (×5): qty 1

## 2022-03-29 MED ORDER — INSULIN ASPART 100 UNIT/ML IJ SOLN
0.0000 [IU] | Freq: Three times a day (TID) | INTRAMUSCULAR | Status: DC
  Administered 2022-03-30: 4 [IU] via SUBCUTANEOUS
  Administered 2022-03-30: 7 [IU] via SUBCUTANEOUS
  Administered 2022-03-30: 11 [IU] via SUBCUTANEOUS
  Administered 2022-03-31: 3 [IU] via SUBCUTANEOUS

## 2022-03-29 MED ORDER — SODIUM CHLORIDE 0.9% FLUSH: 3.0000 mL | INTRAVENOUS | Status: DC | PRN

## 2022-03-29 MED ORDER — DAPAGLIFLOZIN PROPANEDIOL 10 MG PO TABS
10.0000 mg | ORAL_TABLET | Freq: Every day | ORAL | Status: DC
  Administered 2022-03-30 – 2022-03-31 (×2): 10 mg via ORAL
  Filled 2022-03-29 (×2): qty 1

## 2022-03-29 MED ORDER — ONDANSETRON HCL 4 MG/2ML IJ SOLN: 4.0000 mg | Freq: Four times a day (QID) | INTRAMUSCULAR | Status: DC | PRN

## 2022-03-29 MED ORDER — DIAZEPAM 5 MG PO TABS: 5.0000 mg | ORAL_TABLET | Freq: Every day | ORAL | Status: DC | PRN

## 2022-03-29 MED ORDER — ENALAPRIL MALEATE 5 MG PO TABS
5.0000 mg | ORAL_TABLET | Freq: Every day | ORAL | Status: DC
  Administered 2022-03-30 – 2022-03-31 (×2): 5 mg via ORAL
  Filled 2022-03-29 (×2): qty 1

## 2022-03-29 MED ORDER — ISOSORBIDE MONONITRATE ER 30 MG PO TB24
30.0000 mg | ORAL_TABLET | Freq: Every day | ORAL | Status: DC
  Administered 2022-03-29 – 2022-03-30 (×2): 30 mg via ORAL
  Filled 2022-03-29 (×2): qty 1

## 2022-03-29 MED ORDER — INSULIN DETEMIR 100 UNIT/ML ~~LOC~~ SOLN
25.0000 [IU] | Freq: Every day | SUBCUTANEOUS | Status: DC
  Administered 2022-03-30: 25 [IU] via SUBCUTANEOUS
  Filled 2022-03-29 (×2): qty 0.25

## 2022-03-29 MED ORDER — ASPIRIN EC 81 MG PO TBEC
81.0000 mg | DELAYED_RELEASE_TABLET | Freq: Every day | ORAL | Status: DC
  Administered 2022-03-30 – 2022-03-31 (×2): 81 mg via ORAL
  Filled 2022-03-29 (×2): qty 1

## 2022-03-29 MED ORDER — ACETAMINOPHEN 325 MG PO TABS: 650.0000 mg | ORAL_TABLET | ORAL | Status: DC | PRN

## 2022-03-29 MED ORDER — APIXABAN 5 MG PO TABS
5.0000 mg | ORAL_TABLET | Freq: Two times a day (BID) | ORAL | Status: DC
Start: 2022-03-29 — End: 2022-03-31
  Administered 2022-03-29 – 2022-03-31 (×4): 5 mg via ORAL
  Filled 2022-03-29 (×4): qty 1

## 2022-03-29 MED ORDER — CYCLOBENZAPRINE HCL 10 MG PO TABS: 10.0000 mg | ORAL_TABLET | Freq: Two times a day (BID) | ORAL | Status: DC | PRN

## 2022-03-29 MED ORDER — CLOPIDOGREL BISULFATE 75 MG PO TABS
75.0000 mg | ORAL_TABLET | Freq: Every day | ORAL | Status: DC
  Administered 2022-03-30 – 2022-03-31 (×2): 75 mg via ORAL
  Filled 2022-03-29 (×2): qty 1

## 2022-03-29 MED ORDER — OXYCODONE-ACETAMINOPHEN 10-325 MG PO TABS: 1.0000 | ORAL_TABLET | Freq: Four times a day (QID) | ORAL | Status: DC

## 2022-03-29 MED ORDER — ALBUTEROL SULFATE HFA 108 (90 BASE) MCG/ACT IN AERS
2.0000 | INHALATION_SPRAY | Freq: Once | RESPIRATORY_TRACT | Status: AC
  Administered 2022-03-29: 2 via RESPIRATORY_TRACT
  Filled 2022-03-29: qty 6.7

## 2022-03-29 MED ORDER — FUROSEMIDE 10 MG/ML IJ SOLN
40.0000 mg | Freq: Once | INTRAMUSCULAR | Status: AC
  Administered 2022-03-29: 40 mg via INTRAVENOUS
  Filled 2022-03-29: qty 4

## 2022-03-29 MED ORDER — SODIUM CHLORIDE 0.9% FLUSH
3.0000 mL | Freq: Two times a day (BID) | INTRAVENOUS | Status: DC
  Administered 2022-03-29 – 2022-03-30 (×3): 3 mL via INTRAVENOUS

## 2022-03-29 MED ORDER — INSULIN ASPART 100 UNIT/ML IJ SOLN
6.0000 [IU] | Freq: Three times a day (TID) | INTRAMUSCULAR | Status: DC
  Administered 2022-03-30 – 2022-03-31 (×4): 6 [IU] via SUBCUTANEOUS

## 2022-03-29 NOTE — ED Provider Triage Note (Signed)
Emergency Medicine Provider Triage Evaluation Note ? ?Ryan [name] , a 56 y.o. male  was evaluated in triage.  Pt complains of shortness of breath and syncope.  Patient presented to [facility] on 4/17, was found to have NSTEMI underwent cardiac catheterization with placement of stents.  Echocardiogram performed on that visit showed EF 20 to 25%. ? ?Patient reports that since he came to the hospital he has been having shortness of breath and swelling to bilateral lower extremities.  Patient has noted a 20 pound weight gain since being discharged from the hospital.  Patient reports that today while ambulating he became very short of breath and had a syncopal episode.  Family member at bedside reports that patient was caught and gently moved to the floor.  Did not hit his head. ? ?  Endorses orthopnea and paroxysmal nocturnal dyspnea.  Patient has been taking his diuretic as prescribed.  Was sent to the emergency department by his primary care doctor for further evaluation. ? ?Denies any headache, neck pain, back pain, numbness, weakness, palpitations, chest pain, hemoptysis. ? ?Review of Systems  ?Positive: Shortness of breath, orthopnea, paroxysmal nocturnal dyspnea, leg swelling, syncopal episode ?Negative: See above ? ?Physical Exam  ?BP 124/76   Pulse 88   Temp 98.2 ?F (36.8 ?C) (Oral)   Resp (!) 27   SpO2 97%  ?Gen:   Awake, no distress   ?Resp:  Normal effort, subtle Rales to bilateral lower lobes ?MSK:   Moves extremities without difficulty; edema to bilateral lower extremities ?Other:   ? ?Medical Decision Making  ?Medically screening exam initiated at 5:31 PM.  Appropriate orders placed.  [name] was informed that the remainder of the evaluation will be completed by another provider, this initial triage assessment does not replace that evaluation, and the importance of remaining in the ED until their evaluation is complete. ? ?Concern for CHF exacerbation.  Patient did not hit  head we will hold any CT imaging at this time. ?  ?[name], PA-C ?03/29/22 1736 ? ?

## 2022-03-29 NOTE — ED Provider Notes (Signed)
?[facility] EMERGENCY DEPARTMENT ?Provider Note ? ? ?CSN: [id number] ?Arrival date & time: 03/29/22  1654 ? ?  ?History ? ?Chief Complaint  ?Patient presents with  ? Shortness of Breath  ? ? ?Ryan [name] is a 56 y.o. male recent NSTEMI recently hospitalized at [facility] approximately 10 days ago here for evaluation of shortness of breath, syncope and lower extremity swelling.  He was given IV fluids during his hospitalization.  States he is up approximately 20 pounds since his discharge.  States they placed him on 40 mg Lasix which she has been compliant with however he is still having increasing edema to lower extremities.  Having to sit up in a chair at night, some PND, orthopnea.  Apparently has had 2 syncopal episodes, most recently 1 a few hours prior to level here in the ED.  Felt lightheaded, dizzy apparently was caught by staff at his doctor's office.  Did not hit his head, fall to the ground, was lowered by staff per wife in room.  Denies any headache, numbness or weakness.  No chest pain.  Continues to smoke.  Was given albuterol inhaler on prior admission however has not used this.  No pain to bilateral calves.  He is coughing up white sputum. ? ?Currently he called his cardiologist with: Was told to monitor symptoms, return to ED for any worsening complaints. ? ?He does not currently have defibrillator or a life vest. ? ?Typically weight 219, wt today at 240 ? ?Chronic anticoagulation on Eliquis admits to compliance  ? ?Left atrial appendage clot and Afib during hospitalization ? ?Trop 988 during prior admit ? ?Followed with Dr. [name]  ? ?HPI ? ?  ? ?Home Medications ?Prior to Admission medications   ?Medication Sig Start Date End Date Taking? Authorizing Provider  ?aspirin EC 81 MG tablet Take 1 tablet (81 mg total) by mouth daily. 10/23/16  Yes Dunn, Dayna N, PA-C  ?cyclobenzaprine (FLEXERIL) 10 MG tablet Take 1 tablet (10 mg total) by mouth 2 (two) times daily as  needed for muscle spasms. 05/21/19  Yes Simmons, Brittainy M, PA-C  ?ELIQUIS 5 MG TABS tablet TAKE 1 TABLET BY MOUTH 2 TIMES A DAY ?Patient taking differently: Take 5 mg by mouth 2 (two) times daily. 02/28/22  Yes Kathleene Hazel, MD  ?enalapril (VASOTEC) 5 MG tablet Take 1 tablet (5 mg total) by mouth daily. 05/12/20  Yes Barrett, Rae Roam, PA-C  ?FARXIGA 10 MG TABS tablet Take 10 mg by mouth daily. 04/05/21  Yes [provider]  ?furosemide (LASIX) 40 MG tablet Take 1 tablet (40 mg total) by mouth daily. ?Patient taking differently: Take 40 mg by mouth 2 (two) times daily. 04/11/21  Yes Weaver, Scott T, PA-C  ?gabapentin (NEURONTIN) 300 MG capsule Take 300 mg by mouth 3 (three) times daily. 03/25/20  Yes [provider]  ?insulin glargine (LANTUS) 100 UNIT/ML Solostar Pen Inject 50 Units into the skin 2 (two) times daily.   Yes [provider]  ?insulin lispro (HUMALOG) 100 UNIT/ML injection Inject 2-15 Units into the skin 3 (three) times daily before meals. sliding scale .   Yes [provider]  ?amiodarone (PACERONE) 200 MG tablet Take 1 tablet (200 mg total) by mouth daily. ?Patient not taking: Reported on 03/29/2022 08/02/21   Kathleene Hazel, MD  ?carvedilol (COREG) 25 MG tablet Take 1 tablet (25 mg total) by mouth 2 (two) times daily. 02/22/22   Orbie Pyo, MD  ?cetirizine (  ZYRTEC) 10 MG tablet Take 10 mg by mouth daily. 06/02/21   [provider]  ?diazepam (VALIUM) 5 MG tablet Take 5 mg by mouth daily as needed for anxiety. 03/10/21   [provider]  ?isosorbide mononitrate (IMDUR) 30 MG 24 hr tablet Take 1 tablet (30 mg total) by mouth at bedtime. 02/22/22   Orbie Pyo, MD  ?naloxone [facility]) nasal spray 4 mg/0.1 mL Place 4 mg into the nose as needed. 03/31/21   [provider]  ?nitroGLYCERIN (NITROSTAT) 0.4 MG SL tablet Place 0.4 mg under the tongue every 5 (five) minutes as needed for chest pain.    [provider]   ?oxyCODONE-acetaminophen (PERCOCET) 10-325 MG tablet Take 1 tablet by mouth 4 (four) times daily.    [provider]  ?pantoprazole (PROTONIX) 40 MG tablet Take 40 mg by mouth daily.    [provider]  ?potassium chloride (KLOR-CON) 10 MEQ tablet Take 1 tablet (10 mEq total) by mouth daily. 06/11/20   Tereso Newcomer T, PA-C  ?simvastatin (ZOCOR) 80 MG tablet Take 80 mg by mouth at bedtime. 06/02/21   [provider]  ?   ? ?Allergies    ?Hydrocodone and Metformin   ? ?Review of Systems   ?Review of Systems  ?Constitutional: Negative.   ?HENT: Negative.    ?Respiratory:  Positive for shortness of breath.   ?Cardiovascular:  Positive for leg swelling.  ?Gastrointestinal: Negative.   ?Genitourinary: Negative.   ?Musculoskeletal: Negative.   ?Skin: Negative.   ?Neurological:  Positive for syncope.  ?All other systems reviewed and are negative. ? ?Physical Exam ?Updated Vital Signs ?BP 124/68   Pulse 91   Temp 98.2 ?F (36.8 ?C) (Oral)   Resp (!) 26   Ht 5\' 9"  (1.753 m)   Wt 108.9 kg   SpO2 96%   BMI 35.44 kg/m?  ?Physical Exam ?Vitals and nursing note reviewed.  ?Constitutional:   ?   General: He is not in acute distress. ?   Appearance: He is well-developed. He is not ill-appearing, toxic-appearing or diaphoretic.  ?HENT:  ?   Head: Normocephalic and atraumatic.  ?Eyes:  ?   Pupils: Pupils are equal, round, and reactive to light.  ?Cardiovascular:  ?   Rate and Rhythm: Normal rate and regular rhythm.  ?   Pulses:     ?     Radial pulses are 2+ on the right side and 2+ on the left side.  ?     Dorsalis pedis pulses are 1+ on the right side and 1+ on the left side.  ?   Heart sounds: Normal heart sounds.  ?Pulmonary:  ?   Effort: Pulmonary effort is normal. No respiratory distress.  ?   Breath sounds: Wheezing present.  ?   Comments: Expiratory wheeze right greater than left ?Chest:  ?   Comments: Nontender chest wall.  Large CABG incision, old, well-healed ?Abdominal:  ?   General: Bowel  sounds are normal. There is no distension.  ?   Palpations: Abdomen is soft.  ?   Comments: Soft, nontender, no anasarca   ?Musculoskeletal:     ?   General: Normal range of motion.  ?   Cervical back: Normal range of motion and neck supple.  ?   Right lower leg: No tenderness. Edema present.  ?   Left lower leg: No tenderness. Edema present.  ?   Comments: 1+ pitting edema to BLE. Non-tender posterior calves, Homans sign neg. Full  ROM without difficulty. No bony tenderness  ?Skin: ?   General: Skin is warm and dry.  ?   Capillary Refill: Capillary refill takes less than 2 seconds.  ?Neurological:  ?   General: No focal deficit present.  ?   Mental Status: He is alert and oriented to person, place, and time.  ? ?ED Results / Procedures / Treatments   ?Labs ?(all labs ordered are listed, but only abnormal results are displayed) ?Labs Reviewed  ?BRAIN NATRIURETIC PEPTIDE - Abnormal; Notable for the following components:  ?    Result Value  ? B Natriuretic Peptide 625.1 (*)   ? All other components within normal limits  ?COMPREHENSIVE METABOLIC PANEL - Abnormal; Notable for the following components:  ? Creatinine, Ser 1.39 (*)   ? Albumin 3.2 (*)   ? AST 66 (*)   ? ALT 90 (*)   ? Alkaline Phosphatase 203 (*)   ? GFR, Estimated 60 (*)   ? All other components within normal limits  ?TROPONIN I (HIGH SENSITIVITY) - Abnormal; Notable for the following components:  ? Troponin I (High Sensitivity) 430 (*)   ? All other components within normal limits  ?TROPONIN I (HIGH SENSITIVITY) - Abnormal; Notable for the following components:  ? Troponin I (High Sensitivity) 366 (*)   ? All other components within normal limits  ?CBC WITH DIFFERENTIAL/PLATELET  ? ? ?EKG ?EKG Interpretation ? ?Date/Time:  Wednesday March 29 2022 17:37:10 EDT ?Ventricular Rate:  87 ?PR Interval:  162 ?QRS Duration: 80 ?QT Interval:  384 ?QTC Calculation: 462 ?R Axis:   101 ?Text Interpretation: Normal sinus rhythm Anterolateral infarct , age undetermined  T wave abnormality, consider inferior ischemia Abnormal ECG When compared with ECG of 15-Mar-2022 15:33, PREVIOUS ECG IS PRESENT No significant change was found Confirmed by Glynn Octave [phone]) on 03/29/2022 8:3

## 2022-03-29 NOTE — ED Triage Notes (Signed)
Patient reports had a MI last Monday and was seen at [facility] and reports they ran fluids in him the whole time he was there and now having fluid over load and fluid in lungs ?

## 2022-03-30 ENCOUNTER — Encounter (HOSPITAL_COMMUNITY): Payer: Self-pay | Admitting: Cardiology

## 2022-03-30 ENCOUNTER — Observation Stay (HOSPITAL_COMMUNITY): Payer: Medicaid Other

## 2022-03-30 ENCOUNTER — Other Ambulatory Visit: Payer: Self-pay | Admitting: Cardiovascular Disease

## 2022-03-30 DIAGNOSIS — Z7982 Long term (current) use of aspirin: Secondary | ICD-10-CM | POA: Diagnosis not present

## 2022-03-30 DIAGNOSIS — Z79899 Other long term (current) drug therapy: Secondary | ICD-10-CM | POA: Diagnosis not present

## 2022-03-30 DIAGNOSIS — I255 Ischemic cardiomyopathy: Secondary | ICD-10-CM | POA: Diagnosis present

## 2022-03-30 DIAGNOSIS — I2510 Atherosclerotic heart disease of native coronary artery without angina pectoris: Secondary | ICD-10-CM | POA: Diagnosis present

## 2022-03-30 DIAGNOSIS — I252 Old myocardial infarction: Secondary | ICD-10-CM | POA: Diagnosis not present

## 2022-03-30 DIAGNOSIS — Z951 Presence of aortocoronary bypass graft: Secondary | ICD-10-CM | POA: Diagnosis not present

## 2022-03-30 DIAGNOSIS — I5041 Acute combined systolic (congestive) and diastolic (congestive) heart failure: Secondary | ICD-10-CM | POA: Diagnosis not present

## 2022-03-30 DIAGNOSIS — I5043 Acute on chronic combined systolic (congestive) and diastolic (congestive) heart failure: Secondary | ICD-10-CM

## 2022-03-30 DIAGNOSIS — E782 Mixed hyperlipidemia: Secondary | ICD-10-CM | POA: Diagnosis present

## 2022-03-30 DIAGNOSIS — I739 Peripheral vascular disease, unspecified: Secondary | ICD-10-CM | POA: Diagnosis present

## 2022-03-30 DIAGNOSIS — Z9049 Acquired absence of other specified parts of digestive tract: Secondary | ICD-10-CM | POA: Diagnosis not present

## 2022-03-30 DIAGNOSIS — Z794 Long term (current) use of insulin: Secondary | ICD-10-CM | POA: Diagnosis not present

## 2022-03-30 DIAGNOSIS — Z87442 Personal history of urinary calculi: Secondary | ICD-10-CM | POA: Diagnosis not present

## 2022-03-30 DIAGNOSIS — Z7901 Long term (current) use of anticoagulants: Secondary | ICD-10-CM | POA: Diagnosis not present

## 2022-03-30 DIAGNOSIS — K219 Gastro-esophageal reflux disease without esophagitis: Secondary | ICD-10-CM | POA: Diagnosis present

## 2022-03-30 DIAGNOSIS — I110 Hypertensive heart disease with heart failure: Secondary | ICD-10-CM | POA: Diagnosis present

## 2022-03-30 DIAGNOSIS — E1151 Type 2 diabetes mellitus with diabetic peripheral angiopathy without gangrene: Secondary | ICD-10-CM | POA: Diagnosis present

## 2022-03-30 DIAGNOSIS — I214 Non-ST elevation (NSTEMI) myocardial infarction: Secondary | ICD-10-CM | POA: Diagnosis present

## 2022-03-30 DIAGNOSIS — Z888 Allergy status to other drugs, medicaments and biological substances status: Secondary | ICD-10-CM | POA: Diagnosis not present

## 2022-03-30 DIAGNOSIS — R55 Syncope and collapse: Secondary | ICD-10-CM

## 2022-03-30 DIAGNOSIS — I480 Paroxysmal atrial fibrillation: Secondary | ICD-10-CM

## 2022-03-30 DIAGNOSIS — Z833 Family history of diabetes mellitus: Secondary | ICD-10-CM | POA: Diagnosis not present

## 2022-03-30 DIAGNOSIS — Z885 Allergy status to narcotic agent status: Secondary | ICD-10-CM | POA: Diagnosis not present

## 2022-03-30 LAB — BASIC METABOLIC PANEL
Anion gap: 10 (ref 5–15)
BUN: 12 mg/dL (ref 6–20)
CO2: 27 mmol/L (ref 22–32)
Calcium: 8.3 mg/dL — ABNORMAL LOW (ref 8.9–10.3)
Chloride: 97 mmol/L — ABNORMAL LOW (ref 98–111)
Creatinine, Ser: 1.45 mg/dL — ABNORMAL HIGH (ref 0.61–1.24)
GFR, Estimated: 57 mL/min — ABNORMAL LOW (ref >60)
Glucose, Bld: 263 mg/dL — ABNORMAL HIGH (ref 70–99)
Potassium: 3.7 mmol/L (ref 3.5–5.1)
Sodium: 134 mmol/L — ABNORMAL LOW (ref 135–145)

## 2022-03-30 LAB — TROPONIN I (HIGH SENSITIVITY)
Troponin I (High Sensitivity): 346 ng/L (ref <18)
Troponin I (High Sensitivity): 319 ng/L (ref <18)

## 2022-03-30 LAB — HIV ANTIBODY (ROUTINE TESTING W REFLEX): HIV Screen 4th Generation wRfx: NONREACTIVE

## 2022-03-30 LAB — GLUCOSE, CAPILLARY
Glucose-Capillary: 258 mg/dL — ABNORMAL HIGH (ref 70–99)
Glucose-Capillary: 237 mg/dL — ABNORMAL HIGH (ref 70–99)
Glucose-Capillary: 287 mg/dL — ABNORMAL HIGH (ref 70–99)
Glucose-Capillary: 177 mg/dL — ABNORMAL HIGH (ref 70–99)
Glucose-Capillary: 240 mg/dL — ABNORMAL HIGH (ref 70–99)

## 2022-03-30 LAB — ECHOCARDIOGRAM COMPLETE
AR max vel: 1.76 cm2
AV Area VTI: 1.52 cm2
AV Area mean vel: 1.55 cm2
AV Mean grad: 7 mmHg
AV Peak grad: 12.4 mmHg
Ao pk vel: 1.76 m/s
Area-P 1/2: 7.16 cm2
Height: 69 in
S' Lateral: 4.3 cm
Weight: 3745.6 oz

## 2022-03-30 LAB — DIGOXIN LEVEL: Digoxin Level: <0.1 ng/mL — ABNORMAL LOW (ref 0.8–2.0)

## 2022-03-30 MED ORDER — PERFLUTREN LIPID MICROSPHERE
1.0000 mL | INTRAVENOUS | Status: AC | PRN
  Administered 2022-03-30: 7 mL via INTRAVENOUS
  Filled 2022-03-30: qty 10

## 2022-03-30 MED ORDER — INSULIN DETEMIR 100 UNIT/ML ~~LOC~~ SOLN
32.0000 [IU] | Freq: Every day | SUBCUTANEOUS | Status: DC
  Administered 2022-03-30: 32 [IU] via SUBCUTANEOUS
  Filled 2022-03-30 (×2): qty 0.32

## 2022-03-30 NOTE — Progress Notes (Addendum)
Inpatient Diabetes Program Recommendations ? ?AACE/ADA: New Consensus Statement on Inpatient Glycemic Control (2015) ? ?Target Ranges:  Prepandial:   less than 140 mg/dL ?     Peak postprandial:   less than 180 mg/dL (1-2 hours) ?     Critically ill patients:  140 - 180 mg/dL  ? ?Lab Results  ?Component Value Date  ? GLUCAP 237 (H) 03/30/2022  ? HGBA1C 9.6 (H) 03/29/2022  ? ? ?Review of Glycemic Control ? Latest Reference Range & Units 03/30/22 00:28 03/30/22 06:19  ?Glucose-Capillary 70 - 99 mg/dL 951 (H) 884 (H)  ? ?Diabetes history: DM 2 ?Outpatient Diabetes medications: Farxiga 10 mg Daily, Lantus 50 units bid. Humalog 2-15 units tid ?Current orders for Inpatient glycemic control:  ?Farxiga 10 mg Daily ?Levemir 25 units qhs ?Novolog 0-20 units tid ?Novolog 6 units tid meal coverage ? ?A1c 9.6% on 4/26 ? ?Inpatient Diabetes Program Recommendations:   ? ?-  Increase Levemir to 32 units ? ?Plan to see pt this admission ? ?Spoke with pt at bedside regarding A1c and glucose control at home. Pt reports he has not been following a diabetic diet at home and knows what to eat. Pt also reports not checking his glucose like he needs to, however, he is now motivated to start doing what he needs to to monitor and take care of his glucose levels better at home. Encouraged glucose control to reduce complications from hyperglycemia. ? ?Thanks, ? ?Christena Deem RN, MSN, BC-ADM ?Inpatient Diabetes Coordinator ?Team Pager [phone] (8a-5p) ?

## 2022-03-30 NOTE — TOC Progression Note (Signed)
Transition of Care (TOC) - Progression Note  ? ? ?Patient Details  ?Name: Westley Blass ?MRN: [id number] ?Date of Birth: [birth year] ? ?Transition of Care (TOC) CM/SW Contact  ?Leone Haven, RN ?Phone Number: ?03/30/2022, 4:48 PM ? ?Clinical Narrative:    ? ?Transition of Care (TOC) Screening Note ? ? ?Patient Details  ?Name: Seymour Pavlak ?Date of Birth: 26-Feb-1966 ? ? ?Transition of Care (TOC) CM/SW Contact:    ?Leone Haven, RN ?Phone Number: ?03/30/2022, 4:48 PM ? ? ? ?Transition of Care Department [facility]) has reviewed patient  We will continue to monitor patient advancement through interdisciplinary progression rounds. If new patient transition needs arise, please place a TOC consult. ?  ? ? ?  ?  ? ?Expected Discharge Plan and Services ?  ?  ?  ?  ?  ?                ?  ?  ?  ?  ?  ?  ?  ?  ?  ?  ? ? ?Social Determinants of Health (SDOH) Interventions ?Food Insecurity Interventions: Intervention Not Indicated ?Financial Strain Interventions: Intervention Not Indicated ?Housing Interventions: Intervention Not Indicated ?Transportation Interventions: Intervention Not Indicated ? ?Readmission Risk Interventions ? ?  05/12/2020  ?  8:45 AM  ?Readmission Risk Prevention Plan  ?Transportation Screening Complete  ?HRI or Home Care Consult Complete  ?Social Work Consult for Recovery Care Planning/Counseling Complete  ?Palliative Care Screening Not Applicable  ?Medication Review Oceanographer) Complete  ? ? ?

## 2022-03-30 NOTE — Progress Notes (Addendum)
Heart Failure Navigator Progress Note ? ?Following this hospitalization to assess for HV TOC readiness.  ? ?Echo pending? ?? Faster Trial? ? ?Rhae Hammock, BSN, RN ?Heart Failure Nurse Navigator ?Secure Chat Only  ?

## 2022-03-30 NOTE — H&P (Signed)
?Cardiology Admission History and Physical:  ? ?Patient ID: Ryan [name] ?MRN: [id number]; DOB: Apr 02, 1966  ? ?Admission date: 03/29/2022 ? ?PCP:  Nolene Ebbs, MD ?  ?[facility] Providers ?Cardiologist:  [name], MD      ? ? ?Chief Complaint:  Shorntess of breath ? ?Patient Profile:  ? ?[name] is a 56 y.o. male with history of CABG who is being seen 03/30/2022 for the evaluation of acute heart failure syndrome. ? ?History of Present Illness:  ? ?[name] is a 9 yea rold male with history CAD, CABG, prior PCI, atrial fibrillation, HFrEF, PAD s/p aorto bifem and carotid - subclavian bypasses who was admitted 8 days ago to [facility] with NSTEMI.  He underwent invasive angiography and was found to have a culprit SVG-->RCA graft with atretic LIMA (patent LAD) and underwent intervention to the graft with DES.  This was complicated by distal embolization.  He did well and hasn't had recurrent chest pain.  He reports that at the time of discharge he still felt quite dyspneic and like he had a lot of fluid on.  This has continued and at this point he has NHYA IV symptoms, has gained several pounds, his belly is distended, and he has early satiety.  He is adherent to his medical regimen and was able to confirm all of his medications, including triple therapy.  He has had no recurrent chest/arm pain since the revascularization. ? ?Concerningly, he's had at least 3 syncopal episodes since discharge.  The most recent was today when he got up to let his dogs out and passed out as he was walking out the door.  This is typically the most exertion that he does.  He had no chest pain.  He said everything went blurry and next thing he knew he was awake on the floor.  He has never had a witnessed ventricular arrhythmia previously.  I reviewed this is highly concerning and will likely require ICD implantation; he's amenable. ? ?Past Medical History:  ?Diagnosis Date  ? Acute myocardial infarction of other anterior  wall, subsequent episode of care 12/12/2011  ? Anal fissure   ? Arthritis   ? back  ? Atherosclerosis of native artery of both lower extremities with intermittent claudication ([place]) 04/01/2018  ? Bruises easily   ? d/t being on Effient  ? CAD (coronary artery disease)   ? A. Acute Ant STEMI 12/12/2011   ? Cardiomyopathy secondary 12/26/2011  ? Chronic systolic CHF (congestive heart failure) ([place])   ? ischemic CM // Echo 6/21: EF 30-35  //  Echocardiogram 10/21: LV apical thrombus resolved, EF 50-55, ant and apical, ant-lat HK, trivial MR, trivial AI  ? Chronic total occlusion of artery of the extremities ([place]) 02/26/2012  ? Claudication ([place]) 12/26/2011  ? Coronary atherosclerosis of native coronary artery   ? a. ant STEMI with cardiac arrest 2013 s/p DES to mLAD. b. [place] 10/2016 s/p PTCA to mLAD.  ? Diabetes mellitus without complication ([place])   ? Essential hypertension 10/23/2016  ? GERD (gastroesophageal reflux disease)   ? "takes tums"  ? History of blood transfusion   ? no abnormal reaction noted  ? History of kidney stones   ? Hypertension   ? Ischemic cardiomyopathy   ? a. EF 35% in 02/2012 at time of acute MI, improved to normal on subsequent imaging.  ? MI (myocardial infarction) ([place])   ? AMI [id number] - complicated by VT/[place]  ? Mixed hyperlipidemia   ? Numbness and tingling  of right arm 03/31/2013  ? Occlusion and stenosis of carotid artery without mention of cerebral infarction 08/26/2012  ? PAD (peripheral artery disease) ([place])   ? followed by Vascular - aortobifem bypass 01/2013 & left carotid-subclavian artery bypass 04/2013  ? Peripheral vascular disease, unspecified ([place]) 12/16/2012  ? PVD (peripheral vascular disease) ([place]) 03/31/2013  ? Rectal polyp   ? Subclavian steal syndrome 08/11/2013  ? Uncontrolled diabetes mellitus 10/23/2016  ? Wears partial dentures   ? upper  ? ? ?Past Surgical History:  ?Procedure Laterality Date  ? ABDOMINAL AORTOGRAM W/LOWER EXTREMITY N/A 06/26/2017  ? Procedure: Abdominal Aortogram  w/Lower Extremity;  Surgeon: Serafina Mitchell, MD;  Location: [place] [facility];  Service: Cardiovascular;  Laterality: N/A;  ? ABDOMINAL AORTOGRAM W/LOWER EXTREMITY N/A 06/28/2021  ? Procedure: ABDOMINAL AORTOGRAM W/LOWER EXTREMITY;  Surgeon: Serafina Mitchell, MD;  Location: [place] [facility];  Service: Cardiovascular;  Laterality: N/A;  ? ANAL FISSURECTOMY    ? AORTA - BILATERAL FEMORAL ARTERY BYPASS GRAFT N/A 01/16/2013  ? Procedure: AORTA BIFEMORAL BYPASS GRAFT;  Surgeon: Serafina Mitchell, MD;  Location: [place];  Service: Vascular;  Laterality: N/A;  ? APPENDECTOMY    ? CARDIAC CATHETERIZATION N/A 10/23/2016  ? Procedure: Left Heart Cath and Coronary Angiography;  Surgeon: Burnell Blanks, MD;  Location: [place] [facility];  Service: Cardiovascular;  Laterality: N/A;  ? CARDIAC CATHETERIZATION N/A 10/23/2016  ? Procedure: Coronary Balloon Angioplasty;  Surgeon: Burnell Blanks, MD;  Location: [place] [facility];  Service: Cardiovascular;  Laterality: N/A;  ? CAROTID-SUBCLAVIAN BYPASS GRAFT Left 04/03/2013  ? Procedure: BYPASS GRAFT CAROTID-SUBCLAVIAN;  Surgeon: Serafina Mitchell, MD;  Location: [place];  Service: Vascular;  Laterality: Left;  ? CIRCUMCISION N/A 12/15/2015  ? Procedure: CIRCUMCISION ADULT;  Surgeon: Cleon Gustin, MD;  Location: [facility];  Service: Urology;  Laterality: N/A;  ? COLONOSCOPY    ? CORONARY ANGIOPLASTY    ? stent placed Dec 12, 2011 and 2018  ? CORONARY ARTERY BYPASS GRAFT N/A 05/05/2020  ? Procedure: CORONARY ARTERY BYPASS GRAFTING (CABG) x Three, Using Left internal Mammary Artery and Left Leg greater saphenous vein harvested endoscopically;  Surgeon: Grace Isaac, MD;  Location: [place];  Service: Open Heart Surgery;  Laterality: N/A;  ? CORONARY BALLOON ANGIOPLASTY N/A 05/23/2019  ? Procedure: CORONARY BALLOON ANGIOPLASTY;  Surgeon: Burnell Blanks, MD;  Location: [place] [facility];  Service: Cardiovascular;  Laterality: N/A;  ? DIAGNOSTIC LAPAROSCOPY    ?  ENDARTERECTOMY FEMORAL Right 03/13/2018  ? Procedure: FEMORAL ENDARTERECTOMY RIGHT;  Surgeon: Serafina Mitchell, MD;  Location: [facility];  Service: Vascular;  Laterality: Right;  ? FEMORAL-POPLITEAL BYPASS GRAFT Right 03/13/2018  ? Procedure: FEMORAL-POPLITEAL ARTERY BYPASS WITH NON-REVERSE VEIN RIGHT;  Surgeon: Serafina Mitchell, MD;  Location: [facility];  Service: Vascular;  Laterality: Right;  ? GROIN DISSECTION Right 03/13/2018  ? Procedure: RE-DO COMMON FEMORAL ARTERY EXPOSURE;  Surgeon: Serafina Mitchell, MD;  Location: [facility];  Service: Vascular;  Laterality: Right;  ? I & D EXTREMITY Left 04/18/2013  ? Procedure: debridement of left neck lymphocele;  Surgeon: Serafina Mitchell, MD;  Location: [facility] OR;  Service: Vascular;  Laterality: Left;  I and D of left neck  ? I & D EXTREMITY Right 11/21/2014  ? Procedure: IRRIGATION AND DEBRIDEMENT RIGHT HAND;  Surgeon: Roseanne Kaufman, MD;  Location: [place];  Service: Orthopedics;  Laterality: Right;  ? INTRAVASCULAR PRESSURE WIRE/FFR STUDY N/A 04/22/2020  ?  Procedure: INTRAVASCULAR PRESSURE WIRE/FFR STUDY;  Surgeon: Burnell Blanks, MD;  Location: [place] [facility];  Service: Cardiovascular;  Laterality: N/A;  ? LEFT HEART CATH AND CORONARY ANGIOGRAPHY N/A 05/23/2019  ? Procedure: LEFT HEART CATH AND CORONARY ANGIOGRAPHY;  Surgeon: Burnell Blanks, MD;  Location: [place] [facility];  Service: Cardiovascular;  Laterality: N/A;  ? LEFT HEART CATH AND CORONARY ANGIOGRAPHY N/A 04/22/2020  ? Procedure: LEFT HEART CATH AND CORONARY ANGIOGRAPHY;  Surgeon: Burnell Blanks, MD;  Location: [place] [facility];  Service: Cardiovascular;  Laterality: N/A;  ? LEFT HEART CATHETERIZATION WITH CORONARY ANGIOGRAM N/A 12/12/2011  ? Procedure: LEFT HEART CATHETERIZATION WITH CORONARY ANGIOGRAM;  Surgeon: Burnell Blanks, MD;  Location: [facility] C[facility] LAB;  Service: Cardiovascular;  Laterality: N/A;  ? LOWER EXTREMITY ANGIOGRAM N/A 01/31/2012  ? Procedure: LOWER EXTREMITY ANGIOGRAM;   Surgeon: Burnell Blanks, MD;  Location: [facility] CATH LAB;  Service: Cardiovascular;  Laterality: N/A;  ? LOWER EXTREMITY ANGIOGRAPHY N/A 04/23/2018  ? Procedure: LOWER EXTREMITY ANGIOGRAPHY;  Surgeon: Chanda Busing

## 2022-03-30 NOTE — Progress Notes (Addendum)
? ?Progress Note ? ?Patient Name: Ryan [name] ?Date of Encounter: 03/30/2022 ? ?[facility] Cardiologist: [name], MD  ? ?Subjective  ? ?Patient reports that he had an NSTEMI and stent placed on 4/18 at [facility] ([facility] While there, he was given IV fluids for a day and a half. Had a hard time breathing while admitted. When he went home, he continued to have SOB, orthopnea, PND. Noticed ankle swelling. Since being admitted and given IV lasix here, his breathing and ankle edema has improved. Denies any chest pain, R/L arm pain  ? ?Patient had 3 syncopal episodes in the past week. All three times, patient was walking around his home/yard and felt weakness, lightheadedness, and then woke up on the floor. Denies having any chest pain or palpitations during these events. Occasionally has some dizziness upon standing, but no syncope upon standing.  ? ?Inpatient Medications  ?  ?Scheduled Meds: ? amiodarone  200 mg Oral Daily  ? apixaban  5 mg Oral BID  ? aspirin EC  81 mg Oral Daily  ? carvedilol  25 mg Oral BID  ? clopidogrel  75 mg Oral Daily  ? dapagliflozin propanediol  10 mg Oral Daily  ? enalapril  5 mg Oral Daily  ? furosemide  60 mg Intravenous BID  ? gabapentin  300 mg Oral TID  ? insulin aspart  0-20 Units Subcutaneous TID WC  ? insulin aspart  6 Units Subcutaneous TID WC  ? insulin detemir  25 Units Subcutaneous QHS  ? isosorbide mononitrate  30 mg Oral QHS  ? oxyCODONE-acetaminophen  1 tablet Oral QID  ? And  ? oxyCODONE  5 mg Oral QID  ? pantoprazole  40 mg Oral Daily  ? potassium chloride  40 mEq Oral Daily  ? rosuvastatin  40 mg Oral Daily  ? sodium chloride flush  3 mL Intravenous Q12H  ? ?Continuous Infusions: ? sodium chloride    ? ?PRN Meds: ?sodium chloride, acetaminophen, cyclobenzaprine, diazepam, ondansetron (ZOFRAN) IV, sodium chloride flush  ? ?Vital Signs  ?  ?Vitals:  ? 03/29/22 2300 03/30/22 0017 03/30/22 0443 03/30/22 0806  ?BP: 108/71 106/89 99/62 104/77  ?Pulse:  94 94 77 84  ?Resp: (!) 31 20 20 20   ?Temp:  98.5 ?F (36.9 ?C) (!) 97.5 ?F (36.4 ?C) 98.1 ?F (36.7 ?C)  ?TempSrc:  Oral Oral Oral  ?SpO2: 95% 94% 98% 97%  ?Weight:  106.2 kg    ?Height:      ? ? ?Intake/Output Summary (Last 24 hours) at 03/30/2022 0837 ?Last data filed at 03/30/2022 [id number] ?Gross per 24 hour  ?Intake 960 ml  ?Output 3525 ml  ?Net -2565 ml  ? ? ?  03/30/2022  ? 12:17 AM 03/29/2022  ?  5:45 PM 03/15/2022  ?  3:34 PM  ?Last 3 Weights  ?Weight (lbs) 234 lb 1.6 oz 240 lb 229 lb 15 oz  ?Weight (kg) 106.187 kg 108.863 kg 104.3 kg  ?   ? ?Telemetry  ?  ?Sinus rhythm - Personally Reviewed ? ?ECG  ?  ?Normal Sinus rhythm, old anterolateral infarct. Overall unchanged compared to previous EKG from 02/2022 - Personally Reviewed ? ?Physical Exam  ? ?GEN: No acute distress. Laying comfortably in the bed  ?Neck: No JVD ?Cardiac: RRR, no murmurs, rubs, or gallops. Radial pulses 2+ bilaterally  ?Respiratory: Decreased breath sounds and faint crackles in bilateral lung bases. Occasional expiratory wheezes  ?GI: Soft, nontender, non-distended  ?MS: Trace edema in BLE. No deformity. ?  Neuro:  Nonfocal  ?Psych: Normal affect  ? ?Labs  ?  ?High Sensitivity Troponin:   ?Recent Labs  ?Lab 03/15/22 ?1541 03/29/22 ?1717 03/29/22 ?1746  ?TROPONINIHS 31* 366* 430*  ?   ?Chemistry ?Recent Labs  ?Lab 03/29/22 ?1746 03/30/22 ?0409  ?NA 137 134*  ?K 3.7 3.7  ?CL 98 97*  ?CO2 29 27  ?GLUCOSE 84 263*  ?BUN 15 12  ?CREATININE 1.39* 1.45*  ?CALCIUM 8.9 8.3*  ?PROT 6.9  --   ?ALBUMIN 3.2*  --   ?AST 66*  --   ?ALT 90*  --   ?ALKPHOS 203*  --   ?BILITOT 1.0  --   ?GFRNONAA 60* 57*  ?ANIONGAP 10 10  ?  ?Lipids No results for input(s): CHOL, TRIG, HDL, LABVLDL, LDLCALC, CHOLHDL in the last 168 hours.  ?Hematology ?Recent Labs  ?Lab 03/29/22 ?1746  ?WBC 9.4  ?RBC 4.54  ?HGB 13.8  ?HCT 43.1  ?MCV 94.9  ?MCH 30.4  ?MCHC 32.0  ?RDW 14.4  ?PLT 256  ? ?Thyroid No results for input(s): TSH, FREET4 in the last 168 hours.  ?BNP ?Recent Labs  ?Lab  03/29/22 ?1746  ?BNP 625.1*  ?  ?DDimer No results for input(s): DDIMER in the last 168 hours.  ? ?Radiology  ?  ?DG Chest 2 View ? ?Result Date: 03/29/2022 ?CLINICAL DATA:  Fluid overload possible CHF EXAM: CHEST - 2 VIEW COMPARISON:  03/21/2022, 06/03/2020 FINDINGS: Post sternotomy changes. Small bilateral pleural effusions. Cardiomegaly with vascular congestion and diffuse interstitial pulmonary edema. No consolidation or pneumothorax IMPRESSION: Cardiomegaly with bilateral pleural effusions, vascular congestion and interstitial Electronically Signed   By: Donavan Foil M.D.   On: 03/29/2022 18:10   ? ?Cardiac Studies  ? ?Cardiac Catheterization 03/21/2022 -- [place] ([facility])  ?CARDIAC CATHETERIZATION : ?Conclusion:Native RCA 99% mid long segmental stenosis , timi 1-2 ?SVG to RCA:  95% prox body focal stenosis, TIMI 1(culprit). Graft widely patent otherwise.  ?LM-nl ?circ-nl ?LAD; timi 3 flow; prox stent 30% diffuse ISR;  No competetive LIMA flow ?SVG to diag-nl.  Collateralizes RPDA distally ?  ?PCI: PCI of SVG to RCA;  0% residual. 3.5x18, timi 3 post.  Clot embolization to distal PDA, broken up partially with guidewire.  Asymptomatic, no EKG changes.  ?  ?Thoracic aortagram:  L subclavian artery CTO prox ?                                Patent carotid-subclavian graft;  Minimal LIMA filling noted  ? ? ? ? ?Patient Profile  ?   ?56 y.o. male with history of ischemic cardiomyopathy s/p CABG with PCI to SVG to RCA 8 days ago, HFrEF 25-30%, paroxysmal atrial fibrillation, T2DM, PAD with prior aorto-bifem and carotid-subclavian bypasses presenting for acute heart failure syndrome.   ? ?Assessment & Plan  ?  ?Acute on Chronic Systolic and Diastolic Heart Failure  ?Ischemic Cardiomyopathy  ?- Patient was recently admitted to [facility] ([facility]) for cardiac catheterization on 03/21/22. See cath report above  ?- Echocardiogram that admission showed an EF of 20-25%  ?- Repeat Echocardiogram Pending  ?- Continue  Cavedilol 25 mg BID  ?- Continue enalapril 5 mg daily for now-- consider transition to ARB and eventually entresto this admission if BP allows  ?- Started spirnolactone 25 mg daily  ?- Started IV lasix 60 mg IV x2 doses. Assess response to this dose and follow renal function with  diuresis  ?- Continue farxiga 10 mg daily  ? ?Syncope  ?- Patient has had 3 syncopal events in the past week, all while actively walking around his home/yard  ?- Patient on carvedilol without bradycardia  ?- No arrhthymias noted on telemetry  ?- With patient's low EF (20-25%), concern that patient has had episodes of ventricular arrhthymias. Possibly needs DC-ICD implantation, however probably a poor candidate due to recent PCI. Would need monitor first. will discuss with MD  ? ?Elevated Troponin  ?- hsTn 366>>430 this admission. Patient denies having any chest pain or arm pain (has had arm pain with past heart attacks). SOB resolving with diuresis  ?- Suspect that troponin elevation is demand ischemia in the setting of CHF/volume overload with AKI  ?- No plans for ischemic evaluation for now  ? ?Paroxysmal atrial fibrillation  ?- Continue eliquis 5 mg BID  ?- Continue carvedilol  ?- Patient is maintaining sinsus rhythm per telemetry  ? ?CAD s/p CABG ?Recent PCI to SVG on 03/21/22 ?- Currently on ASA/Plavix. Plan to drop ASA on 5/19 as patient is on eliquis  ?- Continue BB, statin  ? ?Type 2 DM  ?- SSI while admitted  ?- Continue Farxiga  ? ?For questions or updates, please contact [place] ?Please consult [email or web address] for contact info under  ? ?  ?   ?Signed, ?Margie Billet, PA-C  ?03/30/2022, 8:37 AM   ? ?

## 2022-03-30 NOTE — TOC Progression Note (Signed)
Transition of Care (TOC) - Progression Note  ? ? ?Patient Details  ?Name: Ryan [name] ?MRN: [id number] ?Date of Birth: 03/21/1966 ? ?Transition of Care (TOC) CM/SW Contact  ?Zenon Mayo, RN ?Phone Number: ?03/30/2022, 4:50 PM ? ?Clinical Narrative:    ? from home, chf ex, stent placed last week, here for diuresing, for echo today, conts on lasix iv 60 bid. TOC will continue to follow for dc needs.  ? ? ?  ?  ? ?Expected Discharge Plan and Services ?  ?  ?  ?  ?  ?                ?  ?  ?  ?  ?  ?  ?  ?  ?  ?  ? ? ?Social Determinants of Health (SDOH) Interventions ?Food Insecurity Interventions: Intervention Not Indicated ?Financial Strain Interventions: Intervention Not Indicated ?Housing Interventions: Intervention Not Indicated ?Transportation Interventions: Intervention Not Indicated ? ?Readmission Risk Interventions ? ?  05/12/2020  ?  8:45 AM  ?Readmission Risk Prevention Plan  ?Transportation Screening Complete  ?[place] or Home Care Consult Complete  ?Social Work Consult for [place] Planning/Counseling Complete  ?Palliative Care Screening Not Applicable  ?Medication Review Press photographer) Complete  ? ? ?

## 2022-03-30 NOTE — Progress Notes (Signed)
Heart Failure Nurse Navigator Progress Note ? ?PCP: Ryan Ebbs, MD ?PCP-Cardiologist: Ryan [name] ?Admission Diagnosis: Acute on chronic congestive heart failure, syncope. ?Admitted from: Doctor ofice ? ?Presentation:   ?Ryan [name] presented with shortness of breath, 10 days ago he had a NSTEMI, (underwent invasive angiography  at [facility]) was discharged per patient wit still feeling short of breath and that he had fluid in his lungs". has had 3 episodes of syncope  since discharge, with "passing out", and lower extremity edema since before he was admitted 10 days ago. Weight up 20 pounds, Trops 430, Echo 30-35%, Patient reported he has been compliant wit all his medications. Denies any chest pain,  ? ?Patient was educated about the signs and symptoms of heart failure, daily weights, when to call his doctor or go to the ER, importance of diet/ fluid restrictions, tips for cooking with no salt, eating out tips, as well as taking all his medications as prescribed and attending all doctor appointments. [name] asked a number of clarifying questions and interacted in the interview. A hospital HF TOC appointment was made for 04/11/2022 @ 10 am, patient voiced his understanding of all education and appointment date/ time.  ? ?ECHO/ LVEF: 30-35% ? ?Clinical Course: ? ?Past Medical History:  ?Diagnosis Date  ? Acute myocardial infarction of other anterior wall, subsequent episode of care 12/12/2011  ? Anal fissure   ? Arthritis   ? back  ? Atherosclerosis of native artery of both lower extremities with intermittent claudication ([place]) 04/01/2018  ? Bruises easily   ? d/t being on Effient  ? CAD (coronary artery disease)   ? A. Acute Ant STEMI 12/12/2011   ? Cardiomyopathy secondary 12/26/2011  ? Chronic systolic CHF (congestive heart failure) ([place])   ? ischemic CM // Echo 6/21: EF 30-35  //  Echocardiogram 10/21: LV apical thrombus resolved, EF 50-55, ant and apical, ant-lat HK, trivial MR, trivial AI  ? Chronic total occlusion of  artery of the extremities ([place]) 02/26/2012  ? Claudication ([place]) 12/26/2011  ? Coronary atherosclerosis of native coronary artery   ? a. ant STEMI with cardiac arrest 2013 s/p DES to mLAD. b. [place] 10/2016 s/p PTCA to mLAD.  ? Diabetes mellitus without complication ([place])   ? Essential hypertension 10/23/2016  ? GERD (gastroesophageal reflux disease)   ? "takes tums"  ? History of blood transfusion   ? no abnormal reaction noted  ? History of kidney stones   ? Hypertension   ? Ischemic cardiomyopathy   ? a. EF 35% in 02/2012 at time of acute MI, improved to normal on subsequent imaging.  ? MI (myocardial infarction) ([place])   ? AMI [id number] - complicated by VT/[place]ades  ? Mixed hyperlipidemia   ? Numbness and tingling of right arm 03/31/2013  ? Occlusion and stenosis of carotid artery without mention of cerebral infarction 08/26/2012  ? PAD (peripheral artery disease) ([place])   ? followed by Vascular - aortobifem bypass 01/2013 & left carotid-subclavian artery bypass 04/2013  ? Peripheral vascular disease, unspecified ([place]) 12/16/2012  ? PVD (peripheral vascular disease) ([place]) 03/31/2013  ? Rectal polyp   ? Subclavian steal syndrome 08/11/2013  ? Uncontrolled diabetes mellitus 10/23/2016  ? Wears partial dentures   ? upper  ?  ? ?Social History  ? ?Socioeconomic History  ? Marital status: Divorced  ?  Spouse name: Not on file  ? Number of children: 1  ? Years of education: Not on file  ? Highest education level: High school graduate  ?  Occupational History  ?  Employer: OTHER  ? Occupation: Disablity  ?  Comment: Since 2013  ?Tobacco Use  ? Smoking status: Some Days  ?  Packs/day: 0.25  ?  Years: 25.00  ?  Pack years: 6.25  ?  Types: Cigarettes  ? Smokeless tobacco: Never  ? Tobacco comments:  ?  Smoking cessation  ?Vaping Use  ? Vaping Use: Never used  ?Substance and Sexual Activity  ? Alcohol use: Yes  ?  Alcohol/week: 2.0 standard drinks  ?  Types: 2 Cans of beer per week  ?  Comment: socially  ? Drug use: No  ? Sexual activity: Yes   ?Other Topics Concern  ? Not on file  ?Social History Narrative  ? Disabled - no longer works  ? ?Social Determinants of Health  ? ?Financial Resource Strain: Low Risk   ? Difficulty of Paying Living Expenses: Not very hard  ?Food Insecurity: No Food Insecurity  ? Worried About Charity fundraiser in the Last Year: Never true  ? Ran Out of Food in the Last Year: Never true  ?Transportation Needs: No Transportation Needs  ? Lack of Transportation (Medical): No  ? Lack of Transportation (Non-Medical): No  ?Physical Activity: Not on file  ?Stress: Not on file  ?Social Connections: Not on file  ? ?Education Assessment and Provision: ? ?Detailed education and instructions provided on heart failure disease management including the following: ? ?Signs and symptoms of Heart Failure ?When to call the physician ?Importance of daily weights ?Low sodium diet ?Fluid restriction ?Medication management ?Anticipated future follow-up appointments ? ?Patient education given on each of the above topics.  Patient acknowledges understanding via teach back method and acceptance of all instructions. ? ?Education Materials:  "Living Better With Heart Failure" Booklet, HF zone tool, & Daily Weight Tracker Tool. ? ?Patient has scale at home: yes ?Patient has pill box at home: NA   ? ?High Risk Criteria for Readmission and/or Poor Patient Outcomes: ?Heart failure hospital admissions (last 6 months): 1  ?No Show rate: 31% ?Difficult social situation: No ?Demonstrates medication adherence: Yes ?Primary Language: English ?Literacy level: Reading, writing, and comprehension. ? ?Barriers of Care:   ?Diet/fluid restrictions ?Smoking cessation ? ?Considerations/Referrals:  ? ?Referral made to Heart Failure Pharmacist Stewardship: Yes ?Referral made to Heart Failure CSW/NCM TOC: No ?Referral made to Heart & Vascular TOC clinic: Yes, 04/11/2022 @ 10am. ? ?Items for Follow-up on DC/TOC: ?Optimize medication ?Diet/fluid restrictions ? ? ?Earnestine Leys,  BSN, RN ?Heart Failure Nurse Navigator ?Secure Chat Only   ?

## 2022-03-30 NOTE — Progress Notes (Signed)
Echocardiogram ?2D Echocardiogram has been performed. ? ?Ryan [name] ?03/30/2022, 8:56 AM ?

## 2022-03-31 ENCOUNTER — Other Ambulatory Visit (HOSPITAL_COMMUNITY): Payer: Self-pay

## 2022-03-31 DIAGNOSIS — R55 Syncope and collapse: Secondary | ICD-10-CM

## 2022-03-31 DIAGNOSIS — I5041 Acute combined systolic (congestive) and diastolic (congestive) heart failure: Secondary | ICD-10-CM

## 2022-03-31 DIAGNOSIS — I480 Paroxysmal atrial fibrillation: Secondary | ICD-10-CM

## 2022-03-31 DIAGNOSIS — I255 Ischemic cardiomyopathy: Secondary | ICD-10-CM | POA: Diagnosis not present

## 2022-03-31 LAB — BASIC METABOLIC PANEL
Anion gap: 6 (ref 5–15)
BUN: 15 mg/dL (ref 6–20)
CO2: 32 mmol/L (ref 22–32)
Calcium: 8.7 mg/dL — ABNORMAL LOW (ref 8.9–10.3)
Chloride: 96 mmol/L — ABNORMAL LOW (ref 98–111)
Creatinine, Ser: 1.51 mg/dL — ABNORMAL HIGH (ref 0.61–1.24)
GFR, Estimated: 54 mL/min — ABNORMAL LOW (ref >60)
Glucose, Bld: 138 mg/dL — ABNORMAL HIGH (ref 70–99)
Potassium: 3.7 mmol/L (ref 3.5–5.1)
Sodium: 134 mmol/L — ABNORMAL LOW (ref 135–145)

## 2022-03-31 LAB — GLUCOSE, CAPILLARY: Glucose-Capillary: 122 mg/dL — ABNORMAL HIGH (ref 70–99)

## 2022-03-31 MED ORDER — ROSUVASTATIN CALCIUM 20 MG PO TABS
40.0000 mg | ORAL_TABLET | Freq: Every day | ORAL | 3 refills | Status: DC
  Filled 2022-03-31: qty 180, 90d supply, fill #0

## 2022-03-31 NOTE — Discharge Summary (Signed)
?Discharge Summary  ?  ?Patient ID: Ryan [name] ?MRN: [id number]; DOB: 1966-11-08 ? ?Admit date: 03/29/2022 ?Discharge date: 03/31/2022 ? ?PCP:  Nolene Ebbs, MD ?  ?[facility] Providers ?Cardiologist:  [name], MD   }   ? ? ?Discharge Diagnoses  ?  ?Principal Problem: ?  Acute combined systolic and diastolic heart failure ([place]) ?Active Problems: ?  Tobacco abuse ?  Mixed hyperlipidemia ?  Ischemic cardiomyopathy ?  Essential hypertension ?  S/P CABG x 3 ?  Paroxysmal atrial fibrillation ([facility]) ? ? ? ?Diagnostic Studies/Procedures  ?  ?Echocardiogram 03/30/22 ? 1. Initial images suspicious for an LV apical thrombus, but Definity  ?contrast images do not show evidence of thrombus. Left ventricular  ?ejection fraction, by estimation, is 30 to 35%. The left ventricle has  ?moderately decreased function. The left  ?ventricle demonstrates regional wall motion abnormalities (see scoring  ?diagram/findings for description). Left ventricular diastolic parameters  ?are consistent with Grade III diastolic dysfunction (restrictive).  ?Elevated left atrial pressure. There is  ?akinesis of the left ventricular, mid-apical anteroseptal wall, anterior  ?wall and apical segment. There is akinesis of the left ventricular,  ?mid-apical inferolateral wall.  ? 2. Right ventricular systolic function is moderately reduced. The right  ?ventricular size is normal. There is mildly elevated pulmonary artery  ?systolic pressure. The estimated right ventricular systolic pressure is  ?[id number] mmHg.  ? 3. Left atrial size was moderately dilated.  ? 4. The mitral valve is normal in structure. Trivial mitral valve  ?regurgitation.  ? 5. The aortic valve is tricuspid. There is moderate calcification of the  ?aortic valve. There is moderate thickening of the aortic valve. Aortic  ?valve regurgitation is trivial.  ? 6. The inferior vena cava is normal in size with greater than 50%  ?respiratory variability, suggesting right atrial  pressure of 3 mmHg.  ? ?Comparison(s): Prior images reviewed side by side. The left ventricular  ?diastolic function is significantly worse.  ? ?Cardiac Catheterization 03/21/2022 -- [place] ([facility])  ?CARDIAC CATHETERIZATION : ?Conclusion:Native RCA 99% mid long segmental stenosis , timi 1-2 ?SVG to RCA:  95% prox body focal stenosis, TIMI 1(culprit). Graft widely patent otherwise.  ?LM-nl ?circ-nl ?LAD; timi 3 flow; prox stent 30% diffuse ISR;  No competetive LIMA flow ?SVG to diag-nl.  Collateralizes RPDA distally ?  ?PCI: PCI of SVG to RCA;  0% residual. 3.5x18, timi 3 post.  Clot embolization to distal PDA, broken up partially with guidewire.  Asymptomatic, no EKG changes.  ?  ?Thoracic aortagram:  L subclavian artery CTO prox ?                                Patent carotid-subclavian graft;  Minimal LIMA filling noted  ?  ?_____________ ?  ?History of Present Illness   ?  ?Ryan [name] is a 56 y.o. male with with history of CABG who was seen 03/30/2022 for the evaluation of acute heart failure syndrome ? ?Ryan [name] is a 56 year old male with history CAD, CABG, prior PCI, atrial fibrillation, HFrEF, PAD s/p aorto bifem and carotid - subclavian bypasses who was admitted 10 days ago to [facility] with NSTEMI.  He underwent invasive angiography and was found to have a culprit SVG-->RCA graft with atretic LIMA (patent LAD) and underwent intervention to the graft with DES.  This was complicated by distal embolization.  He did well and hasn't had recurrent chest pain.  He reported that at the time of discharge from [facility], he still felt quite dyspneic and like he had a lot of fluid on. He continued to have dyspnea and ankle edema at home, and he presented to [facility] with NHYA IV symptoms, several pounds of weight gain, abdominal distention, and early satiety.  He reported being adherent to his medical regimen and was able to confirm all of his medications, including triple therapy.  He denied recurrent chest/arm  pain since the revascularization. ?  ?Concerningly, he had at least 3 syncopal episodes since his previous discharge.  The most recent was earlier on the day of admission to [facility] when he got up to let his dogs out and passed out as he was walking out the door. Reported having dizziness upon standing since starting Imdur at the end of March.  He denied chest pain.  He reported that everything went blurry and next thing he knew he was awake on the floor.  He has never had a witnessed ventricular arrhythmia previously.  ? ?Hospital Course  ?   ?Consultants: None  ? ?Acute on Chronic Systolic and Diastolic Heart Failure  ?Ischemic Cardiomyopathy  ?- Patient was recently admitted to [facility] ([facility]) for cardiac catheterization on 03/21/22. See cath report above. Echocardiogram that admission showed an EF of 20-25%  ?- Repeat Echocardiogram report as above   ?- Continue Cavedilol 25 mg BID  ?- Continue enalapril 5 mg daily for now-- consider transition to ARB/entresto at follow up appointment if BP allows  ?- Given IV lasix 60 mg IV x2 doses with improvement in symptoms. Restart home PO lasix 40 mg daily. Restart home potassium supplementation   ?- Continue farxiga 10 mg daily  ?- Patient has a follow up appointment with Dr. Angelena Form on 04/03/22. Will need BMP at that visit to assess renal function on lasix   ? ?Syncope  ?- Patient has had 3 syncopal events in the past week, all while actively walking around his home/yard. Experiences a prodrome of vision changes, dizziness, weakness prior to syncope. No chest pain or palpitations    ?- Patient on carvedilol without bradycardia  ?- No arrhthymias noted on telemetry  ?- Patient reports having dizziness upon standing since starting Imdur in March. Suspect orthostatic hypotension has been contributing to syncope. BP has been soft this admission. I will hold Imdur at discharge. Recommended monitoring BP at home   ?- If patient continues to have syncope off Imdur, may  need monitor in the future  ?  ?Elevated Troponin  ?- hsTn 366>>430 this admission. Patient denies having any chest pain or arm pain (has had arm pain with past heart attacks). SOB resolving with diuresis  ?- Suspect that troponin elevation is demand ischemia in the setting of CHF/volume overload with AKI  ?- No plans for ischemic evaluation for now  ?  ?Paroxysmal atrial fibrillation  ?- Continue eliquis 5 mg BID  ?- Continue carvedilol  ?- Patient is maintaining sinsus rhythm per telemetry  ?- Amiodarone was stopped at outpatient visit with cardiology on 02/22/22 ?  ?CAD s/p CABG ?Recent PCI to SVG on 03/21/22 ?- Currently on ASA/Plavix. Plan to stop ASA on 5/19 as patient is on eliquis  ?- Continue BB, statin  ?  ?Type 2 DM  ?- Continue Home Regiment  ?- Follow up with PCP  ? ?Did the patient have an acute coronary syndrome (MI, NSTEMI, STEMI, etc) this admission?:  No                               ?  Did the patient have a percutaneous coronary intervention (stent / angioplasty)?:  No.   ? ?   ?Physical Exam ?Vitals and nursing note reviewed.  ?Constitutional:   ?   General: He is not in acute distress. ?   Appearance: He is not ill-appearing or toxic-appearing.  ?HENT:  ?   Head: Normocephalic and atraumatic.  ?Eyes:  ?   Extraocular Movements: Extraocular movements intact.  ?Neck:  ?   Vascular: No JVD.  ?Cardiovascular:  ?   Rate and Rhythm: Normal rate and regular rhythm.  ?   Heart sounds: No murmur heard. ?  No friction rub.  ?Pulmonary:  ?   Effort: Pulmonary effort is normal.  ?   Breath sounds: Wheezing present. No rhonchi or rales.  ?Abdominal:  ?   Palpations: Abdomen is soft.  ?   Tenderness: There is no abdominal tenderness.  ?Musculoskeletal:     ?   General: Normal range of motion.  ?   Cervical back: Normal range of motion and neck supple.  ?   Right lower leg: No edema.  ?   Left lower leg: No edema.  ?Skin: ?   General: Skin is warm and dry.  ?Neurological:  ?   General: No focal deficit present.   ?   Mental Status: He is alert and oriented to person, place, and time.  ?Psychiatric:     ?   Mood and Affect: Mood normal.     ?   Behavior: Behavior normal.  ?  ? Patient seen and evaluated by Dr. Gerald Stabs

## 2022-04-03 ENCOUNTER — Ambulatory Visit (INDEPENDENT_AMBULATORY_CARE_PROVIDER_SITE_OTHER): Payer: Medicaid Other

## 2022-04-03 ENCOUNTER — Encounter: Payer: Self-pay | Admitting: Cardiovascular Disease

## 2022-04-03 ENCOUNTER — Ambulatory Visit (INDEPENDENT_AMBULATORY_CARE_PROVIDER_SITE_OTHER): Payer: Medicaid Other | Admitting: Cardiovascular Disease

## 2022-04-03 VITALS — BP 108/70 | HR 92 | Ht 69.0 in | Wt 233.2 lb

## 2022-04-03 DIAGNOSIS — I480 Paroxysmal atrial fibrillation: Secondary | ICD-10-CM

## 2022-04-03 DIAGNOSIS — R55 Syncope and collapse: Secondary | ICD-10-CM

## 2022-04-03 DIAGNOSIS — I255 Ischemic cardiomyopathy: Secondary | ICD-10-CM | POA: Diagnosis not present

## 2022-04-03 DIAGNOSIS — I2510 Atherosclerotic heart disease of native coronary artery without angina pectoris: Secondary | ICD-10-CM

## 2022-04-03 DIAGNOSIS — E782 Mixed hyperlipidemia: Secondary | ICD-10-CM | POA: Diagnosis not present

## 2022-04-03 DIAGNOSIS — I5022 Chronic systolic (congestive) heart failure: Secondary | ICD-10-CM

## 2022-04-03 NOTE — Progress Notes (Signed)
? ?Chief Complaint  ?Patient presents with  ? Follow-up  ?  CAD  ? ?History of Present Illness: 56 yo male with history of ongoing tobacco abuse, CAD, HLD, PAD, HLD, ischemic cardiomyopathy and insulin dependent diabetes mellitus who is here today for cardiac follow up. He had an anterior STEMI in January 2013 with cardiac arrest and found to have an occluded mid LAD treated with a drug eluting stent. The third OM branch was chronically occluded and the RCA had mild disease. His post MI LVEF was 35% but this was found to be normal on follow up echo. Cardiac cath in November 2017 with severe stent restenosis in the LAD, treated with a cutting balloon angioplasty. He did not keep his f/u appt in our office following his PCI. He has been non-compliant with follow up, medications over the past few years and continues to smoke. Admitted to [facility] in August 2018 with an anterior STEMI. His LAD was occluded proximal to the old stent and this was treated with a drug eluting stent. Angioplasty performed on the ostial Diagonal branch. LVEF was 40% following the MI. He had unstable angina in June 2020 and repeat cardiac cath showed severe restenosis in the mid LAD stented segment which was treated with cutting balloon angioplasty. He also had ostial stenosis of the diagonal branch (jailed by LAD stents), mild to moderate non-obstructive disease in the mid RCA and moderate LV systolic dysfunction with anteroapical hypokinesis (c/w findings on echo in 2018 at [facility]). LVEF=35-45%. He was placed on DAPT w/ ASA and Brilinta and continued on statin, ? blocker, ACEi and long acting nitrate. I saw him in May 2021 and he c/o chest pain c/w unstable angina. Cardiac cath 04/22/20 with progression of disease in the LAD and RCA. He underwent 3V CABG 05/25/20. (LIMA to LAD, SVG to Diagonal and SVG to RCA). His post-operative course was complicated by atrial fibrillation and he was started on amiodarone. Echo with  LVEF=30-35% with apical thrombus. He was discharged on Eliquis with a Lifevest. Echo post bypass with LVEF=40-45%. He was seen in our office 02/22/22 by Dr. Ali Lowe and reported chest pain. Imdur was started. He was then admitted to [facility] with chest pain and NSTEMI. Cardiac cath at [facility] April 2023 with severe disease in the SVG to RCA treated with a drug eluting stent. He was then admitted to [facility] 03/30/22 with syncope x 3 and was Imdur was stopped. He was felt to be volume overloaded with c/o LE edema and abdominal swelling and was diuresed with Lasix. Echo 03/30/22 with LVEF=30-35% with apical akinesis. No LV thrombus.  ? ?His PAD is followed in [facility] by Dr. Trula Slade. In 2013 he was found to have occlusion of his distal aorta and left subclavian artery. He underwent aortobifemoral bypass in February 2014 and left carotid to subclavian bypass in May 2014.  In April 2019, he underwent right femoral to AK popliteal bypass with saphenous vein and right femoral endarterectomy. In July 2022 he had drug coated balloon angioplasty of the right common femoral artery.  ? ?He is here today for follow up. The patient denies any chest pain, dyspnea, palpitations, lower extremity edema, orthopnea, PND, dizziness, near syncope or syncope.  ? ? ?Primary Care Physician: Nolene Ebbs, MD ? ?Past Medical History:  ?Diagnosis Date  ? Acute myocardial infarction of other anterior wall, subsequent episode of care 12/12/2011  ? Anal fissure   ? Arthritis   ?  back  ? Atherosclerosis of native artery of both lower extremities with intermittent claudication ([place]) 04/01/2018  ? Bruises easily   ? d/t being on Effient  ? CAD (coronary artery disease)   ? A. Acute Ant STEMI 12/12/2011   ? Cardiomyopathy secondary 12/26/2011  ? Chronic systolic CHF (congestive heart failure) ([place])   ? ischemic CM // Echo 6/21: EF 30-35  //  Echocardiogram 10/21: LV apical thrombus resolved, EF 50-55, ant and apical, ant-lat HK, trivial MR,  trivial AI  ? Chronic total occlusion of artery of the extremities ([place]) 02/26/2012  ? Claudication ([place]) 12/26/2011  ? Coronary atherosclerosis of native coronary artery   ? a. ant STEMI with cardiac arrest 2013 s/p DES to mLAD. b. [place] 10/2016 s/p PTCA to mLAD.  ? Diabetes mellitus without complication ([place])   ? Essential hypertension 10/23/2016  ? GERD (gastroesophageal reflux disease)   ? "takes tums"  ? History of blood transfusion   ? no abnormal reaction noted  ? History of kidney stones   ? Hypertension   ? Ischemic cardiomyopathy   ? a. EF 35% in 02/2012 at time of acute MI, improved to normal on subsequent imaging.  ? MI (myocardial infarction) ([place])   ? AMI [id number] - complicated by VT/Tosades  ? Mixed hyperlipidemia   ? Numbness and tingling of right arm 03/31/2013  ? Occlusion and stenosis of carotid artery without mention of cerebral infarction 08/26/2012  ? PAD (peripheral artery disease) ([place])   ? followed by Vascular - aortobifem bypass 01/2013 & left carotid-subclavian artery bypass 04/2013  ? Peripheral vascular disease, unspecified ([place]) 12/16/2012  ? PVD (peripheral vascular disease) ([place]) 03/31/2013  ? Rectal polyp   ? Subclavian steal syndrome 08/11/2013  ? Uncontrolled diabetes mellitus 10/23/2016  ? Wears partial dentures   ? upper  ? ? ?Past Surgical History:  ?Procedure Laterality Date  ? ABDOMINAL AORTOGRAM W/LOWER EXTREMITY N/A 06/26/2017  ? Procedure: Abdominal Aortogram w/Lower Extremity;  Surgeon: Serafina Mitchell, MD;  Location: [place] [facility];  Service: Cardiovascular;  Laterality: N/A;  ? ABDOMINAL AORTOGRAM W/LOWER EXTREMITY N/A 06/28/2021  ? Procedure: ABDOMINAL AORTOGRAM W/LOWER EXTREMITY;  Surgeon: Serafina Mitchell, MD;  Location: [place] [facility];  Service: Cardiovascular;  Laterality: N/A;  ? ANAL FISSURECTOMY    ? AORTA - BILATERAL FEMORAL ARTERY BYPASS GRAFT N/A 01/16/2013  ? Procedure: AORTA BIFEMORAL BYPASS GRAFT;  Surgeon: Serafina Mitchell, MD;  Location: [place];  Service: Vascular;   Laterality: N/A;  ? APPENDECTOMY    ? CARDIAC CATHETERIZATION N/A 10/23/2016  ? Procedure: Left Heart Cath and Coronary Angiography;  Surgeon: Burnell Blanks, MD;  Location: [place] [facility];  Service: Cardiovascular;  Laterality: N/A;  ? CARDIAC CATHETERIZATION N/A 10/23/2016  ? Procedure: Coronary Balloon Angioplasty;  Surgeon: Burnell Blanks, MD;  Location: [place] [facility];  Service: Cardiovascular;  Laterality: N/A;  ? CAROTID-SUBCLAVIAN BYPASS GRAFT Left 04/03/2013  ? Procedure: BYPASS GRAFT CAROTID-SUBCLAVIAN;  Surgeon: Serafina Mitchell, MD;  Location: [place];  Service: Vascular;  Laterality: Left;  ? CIRCUMCISION N/A 12/15/2015  ? Procedure: CIRCUMCISION ADULT;  Surgeon: Cleon Gustin, MD;  Location: [facility];  Service: Urology;  Laterality: N/A;  ? COLONOSCOPY    ? CORONARY ANGIOPLASTY    ? stent placed Dec 12, 2011 and 2018  ? CORONARY ARTERY BYPASS GRAFT N/A 05/05/2020  ? Procedure: CORONARY ARTERY BYPASS GRAFTING (CABG) x Three, Using Left internal Mammary Artery and Left Leg greater saphenous  vein harvested endoscopically;  Surgeon: Grace Isaac, MD;  Location: [place];  Service: Open Heart Surgery;  Laterality: N/A;  ? CORONARY BALLOON ANGIOPLASTY N/A 05/23/2019  ? Procedure: CORONARY BALLOON ANGIOPLASTY;  Surgeon: Burnell Blanks, MD;  Location: [place] [facility];  Service: Cardiovascular;  Laterality: N/A;  ? DIAGNOSTIC LAPAROSCOPY    ? ENDARTERECTOMY FEMORAL Right 03/13/2018  ? Procedure: FEMORAL ENDARTERECTOMY RIGHT;  Surgeon: Serafina Mitchell, MD;  Location: [facility] OR;  Service: Vascular;  Laterality: Right;  ? FEMORAL-POPLITEAL BYPASS GRAFT Right 03/13/2018  ? Procedure: FEMORAL-POPLITEAL ARTERY BYPASS WITH NON-REVERSE VEIN RIGHT;  Surgeon: Serafina Mitchell, MD;  Location: [facility];  Service: Vascular;  Laterality: Right;  ? GROIN DISSECTION Right 03/13/2018  ? Procedure: RE-DO COMMON FEMORAL ARTERY EXPOSURE;  Surgeon: Serafina Mitchell, MD;  Location: [facility] OR;  Service: Vascular;   Laterality: Right;  ? I & D EXTREMITY Left 04/18/2013  ? Procedure: debridement of left neck lymphocele;  Surgeon: Serafina Mitchell, MD;  Location: [place];  Service: Vascular;  Laterality: Left;  I and D

## 2022-04-03 NOTE — Patient Instructions (Signed)
Medication Instructions:  ?No changes ?*If you need a refill on your cardiac medications before your next appointment, please call your pharmacy* ? ? ?Lab Work: ?none ?If you have labs (blood work) drawn today and your tests are completely normal, you will receive your results only by: ?MyChart Message (if you have MyChart) OR ?A paper copy in the mail ?If you have any lab test that is abnormal or we need to change your treatment, we will call you to review the results. ? ? ?Testing/Procedures: ?Your physician has requested that you have an echocardiogram. Echocardiography is a painless test that uses sound waves to create images of your heart. It provides your doctor with information about the size and shape of your heart and how well your heart?s chambers and valves are working. This procedure takes approximately one hour. There are no restrictions for this procedure. ? ?Zio XT Heart Monitor -14 days.  See instructions below. ? ? ?Follow-Up: ?At [facility], you and your health needs are our priority.  As part of our continuing mission to provide you with exceptional heart care, we have created designated Provider Care Teams.  These Care Teams include your primary Cardiologist (physician) and Advanced Practice Providers (APPs -  Physician Assistants and Nurse Practitioners) who all work together to provide you with the care you need, when you need it. ? ? ?Your next appointment:   ?4 month(s) ? ?The format for your next appointment:   ?In Person ? ?Provider:   ?[name], MD { ?Or Advanced Practice Provider (NP or PA) ? ? ?Important Information About [place] ? ? ? ? ?  ?

## 2022-04-03 NOTE — Progress Notes (Unsigned)
[id number] ZIO XT monitor from office inventory applied to patient. ?

## 2022-04-10 ENCOUNTER — Ambulatory Visit: Payer: Medicaid Other | Admitting: Physician Assistant

## 2022-04-10 NOTE — Progress Notes (Incomplete)
? ? ?HEART & VASCULAR TRANSITION OF CARE CONSULT NOTE  ? ? ? ?Referring Physician: Dr [name]  ?Primary Care: dr [name] ?Primary Cardiologist:Dr McAlhany  ? ?HPI: ?Referred to clinic by  for heart failure consultation.  ? ?Mr General is a 56 year old with PAD, CAD, PAF, and chronic HFrEF. He underwent 3V CABG 05/25/20. (LIMA to LAD, SVG to Diagonal and SVG to RCA). His post-operative course was complicated by atrial fibrillation and he was started on amiodarone. Echo with LVEF->30-35% with apical thrombus. He was discharged on Eliquis with a Lifevest.  ? ?Echo post bypass with LVEF->40-45%.  ? ?On 02/22/22 saw Dr. Lynnette Caffey and reported chest pain. Imdur was started. He was then admitted to [facility] with chest pain and NSTEMI. Cardiac cath at [facility] April 2023 with severe disease in the SVG to RCA treated with a drug eluting stent.  ? ?Admitted 03/30/2022 to [facility] with syncope. Concern this was orthostatic hypotension and addition of imdur. Imdur was stopped. Discharged on 03/31/22 .  ? ?Saw Dr [name] on 04/03/22. Had zio patch placed. Plan for ECHO 4 months.  ? ?Today he returns for HF follow up.Overall feeling fine. Denies SOB/PND/Orthopnea. Appetite ok. No fever or chills. Weight at home  pounds. Taking all medications. ? ? ?Cardiac Testing  ?Echo Ef 30-35%  ?Echo 09/2020 EF 50-55% LV thrombus ? ?LHC 03/2022  ?-  severe disease in the SVG to RCA treated with a drug eluting stent. ? ?LHC 2021 ?Ost 3rd Mrg to 3rd Mrg lesion is 100% stenosed. ?Mid RCA lesion is 60% stenosed. ?3rd Mrg lesion is 100% stenosed. ?Prox LAD to Mid LAD lesion is 80% stenosed. ?1st Diag lesion is 80% stenosed. ?The left ventricular ejection fraction is 25-35% by visual estimate. ?There is moderate left ventricular systolic dysfunction. ?There is no mitral valve regurgitation.  ?1. Severe restenosis mid LAD stented segment ?2. Severe stenosis ostium of the Diagonal branch, jailed by the stent ?3. Chronic occlusion of the  third OM branch, filling from collaterals ?4. Moderate mid RCA stenosis.  ?5. Moderate segmental LV systolic dysfunction ? ?Review of Systems: [y] = yes, [ ]  = no  ? ?General: Weight gain [ ] ; Weight loss [ ] ; Anorexia [ ] ; Fatigue [ ] ; Fever [ ] ; Chills [ ] ; Weakness [ ]   ?Cardiac: Chest pain/pressure [ ] ; Resting SOB [ ] ; Exertional SOB [ ] ; Orthopnea [ ] ; Pedal Edema [ ] ; Palpitations [ ] ; Syncope [ ] ; Presyncope [ ] ; Paroxysmal nocturnal dyspnea[ ]   ?Pulmonary: Cough [ ] ; Wheezing[ ] ; Hemoptysis[ ] ; Sputum [ ] ; Snoring [ ]   ?GI: Vomiting[ ] ; Dysphagia[ ] ; Melena[ ] ; Hematochezia [ ] ; Heartburn[ ] ; Abdominal pain [ ] ; Constipation [ ] ; Diarrhea [ ] ; BRBPR [ ]   ?GU: Hematuria[ ] ; Dysuria [ ] ; Nocturia[ ]   ?Vascular: Pain in legs with walking [ ] ; Pain in feet with lying flat [ ] ; Non-healing sores [ ] ; Stroke [ ] ; TIA [ ] ; Slurred speech [ ] ;  ?Neuro: Headaches[ ] ; Vertigo[ ] ; Seizures[ ] ; Paresthesias[ ] ;Blurred vision [ ] ; Diplopia [ ] ; Vision changes [ ]   ?Ortho/Skin: Arthritis [ ] ; Joint pain [ ] ; Muscle pain [ ] ; Joint swelling [ ] ; Back Pain [ ] ; Rash [ ]   ?Psych: Depression[ ] ; Anxiety[ ]   ?Heme: Bleeding problems [ ] ; Clotting disorders [ ] ; Anemia [ ]   ?Endocrine: Diabetes [ ] ; Thyroid dysfunction[ ]  ? ? ?Past Medical History:  ?Diagnosis Date  ? Acute myocardial infarction of other anterior  wall, subsequent episode of care 12/12/2011  ? Anal fissure   ? Arthritis   ? back  ? Atherosclerosis of native artery of both lower extremities with intermittent claudication (HCC) 04/01/2018  ? Bruises easily   ? d/t being on Effient  ? CAD (coronary artery disease)   ? A. Acute Ant STEMI 12/12/2011   ? Cardiomyopathy secondary 12/26/2011  ? Chronic systolic CHF (congestive heart failure) (HCC)   ? ischemic CM // Echo 6/21: EF 30-35  //  Echocardiogram 10/21: LV apical thrombus resolved, EF 50-55, ant and apical, ant-lat HK, trivial MR, trivial AI  ? Chronic total occlusion of artery of the extremities (HCC) 02/26/2012  ?  Claudication (HCC) 12/26/2011  ? Coronary atherosclerosis of native coronary artery   ? a. ant STEMI with cardiac arrest 2013 s/p DES to mLAD. b. [place] 10/2016 s/p PTCA to mLAD.  ? Diabetes mellitus without complication (HCC)   ? Essential hypertension 10/23/2016  ? GERD (gastroesophageal reflux disease)   ? "takes tums"  ? History of blood transfusion   ? no abnormal reaction noted  ? History of kidney stones   ? Hypertension   ? Ischemic cardiomyopathy   ? a. EF 35% in 02/2012 at time of acute MI, improved to normal on subsequent imaging.  ? MI (myocardial infarction) (HCC)   ? AMI 1/13 - complicated by VT/Tosades  ? Mixed hyperlipidemia   ? Numbness and tingling of right arm 03/31/2013  ? Occlusion and stenosis of carotid artery without mention of cerebral infarction 08/26/2012  ? PAD (peripheral artery disease) (HCC)   ? followed by Vascular - aortobifem bypass 01/2013 & left carotid-subclavian artery bypass 04/2013  ? Peripheral vascular disease, unspecified (HCC) 12/16/2012  ? PVD (peripheral vascular disease) (HCC) 03/31/2013  ? Rectal polyp   ? Subclavian steal syndrome 08/11/2013  ? Uncontrolled diabetes mellitus 10/23/2016  ? Wears partial dentures   ? upper  ? ? ?Current Outpatient Medications  ?Medication Sig Dispense Refill  ? aspirin EC 81 MG tablet Take 1 tablet (81 mg total) by mouth daily. 90 tablet 3  ? carvedilol (COREG) 25 MG tablet Take 1 tablet (25 mg total) by mouth 2 (two) times daily. 180 tablet 3  ? cetirizine (ZYRTEC) 10 MG tablet Take 10 mg by mouth daily.    ? clopidogrel (PLAVIX) 75 MG tablet Take 75 mg by mouth daily.    ? cyclobenzaprine (FLEXERIL) 10 MG tablet Take 1 tablet (10 mg total) by mouth 2 (two) times daily as needed for muscle spasms. 30 tablet 0  ? diazepam (VALIUM) 5 MG tablet Take 5 mg by mouth daily as needed for anxiety.    ? ELIQUIS 5 MG TABS tablet TAKE 1 TABLET BY MOUTH 2 TIMES A DAY (Patient taking differently: Take 5 mg by mouth 2 (two) times daily.) 180 tablet 1  ? enalapril  (VASOTEC) 5 MG tablet Take 1 tablet (5 mg total) by mouth daily. 60 tablet 2  ? FARXIGA 10 MG TABS tablet Take 10 mg by mouth daily.    ? furosemide (LASIX) 40 MG tablet Take 1 tablet (40 mg total) by mouth daily. (Patient taking differently: Take 40 mg by mouth 2 (two) times daily.) 90 tablet 3  ? gabapentin (NEURONTIN) 300 MG capsule Take 300 mg by mouth 3 (three) times daily.    ? insulin glargine (LANTUS) 100 UNIT/ML Solostar Pen Inject 50 Units into the skin 2 (two) times daily.    ? insulin lispro (HUMALOG) 100 UNIT/ML  injection Inject 2-15 Units into the skin 3 (three) times daily before meals. sliding scale .    ? naloxone (NARCAN) nasal spray 4 mg/0.1 mL Place 4 mg into the nose once as needed (overdose).    ? nitroGLYCERIN (NITROSTAT) 0.4 MG SL tablet Place 0.4 mg under the tongue every 5 (five) minutes as needed for chest pain.    ? oxyCODONE-acetaminophen (PERCOCET) 10-325 MG tablet Take 1 tablet by mouth 4 (four) times daily.    ? pantoprazole (PROTONIX) 40 MG tablet Take 40 mg by mouth daily.    ? potassium chloride (KLOR-CON) 10 MEQ tablet Take 1 tablet (10 mEq total) by mouth daily. 30 tablet 6  ? rosuvastatin (CRESTOR) 20 MG tablet Take 2 tablets (40 mg total) by mouth daily. 180 tablet 3  ? ?No current facility-administered medications for this visit.  ? ? ?Allergies  ?Allergen Reactions  ? Hydrocodone Hives and Nausea And Vomiting  ? Metformin   ?  Other reaction(s): Other (See Comments) ?Water blisters between fingers  ? ? ?  ?Social History  ? ?Socioeconomic History  ? Marital status: Divorced  ?  Spouse name: Not on file  ? Number of children: 1  ? Years of education: Not on file  ? Highest education level: High school graduate  ?Occupational History  ?  Employer: OTHER  ? Occupation: Disablity  ?  Comment: Since 2013  ?Tobacco Use  ? Smoking status: Some Days  ?  Packs/day: 0.25  ?  Years: 25.00  ?  Pack years: 6.25  ?  Types: Cigarettes  ? Smokeless tobacco: Never  ? Tobacco comments:  ?   Smoking cessation  ?Vaping Use  ? Vaping Use: Never used  ?Substance and Sexual Activity  ? Alcohol use: Yes  ?  Alcohol/week: 2.0 standard drinks  ?  Types: 2 Cans of beer per week  ?  Comment: socially

## 2022-04-11 ENCOUNTER — Telehealth (HOSPITAL_COMMUNITY): Payer: Self-pay | Admitting: Pharmacy Technician

## 2022-04-11 ENCOUNTER — Encounter (HOSPITAL_COMMUNITY): Payer: Medicaid Other

## 2022-04-11 ENCOUNTER — Telehealth (HOSPITAL_COMMUNITY): Payer: Self-pay | Admitting: *Deleted

## 2022-04-11 ENCOUNTER — Telehealth (HOSPITAL_COMMUNITY): Payer: Self-pay

## 2022-04-11 ENCOUNTER — Other Ambulatory Visit (HOSPITAL_COMMUNITY): Payer: Self-pay

## 2022-04-11 ENCOUNTER — Ambulatory Visit (HOSPITAL_COMMUNITY)
Admission: RE | Admit: 2022-04-11 | Discharge: 2022-04-11 | Disposition: A | Discharge status: Home / Self Care | Payer: Medicaid Other | Source: Ambulatory Visit | Attending: Adult Health | Admitting: Adult Health

## 2022-04-11 ENCOUNTER — Encounter (HOSPITAL_COMMUNITY): Payer: Self-pay

## 2022-04-11 VITALS — BP 118/78 | HR 90 | Wt 226.4 lb

## 2022-04-11 DIAGNOSIS — G4719 Other hypersomnia: Secondary | ICD-10-CM | POA: Diagnosis not present

## 2022-04-11 DIAGNOSIS — N1831 Chronic kidney disease, stage 3a: Secondary | ICD-10-CM

## 2022-04-11 DIAGNOSIS — E669 Obesity, unspecified: Secondary | ICD-10-CM | POA: Diagnosis not present

## 2022-04-11 DIAGNOSIS — Z7902 Long term (current) use of antithrombotics/antiplatelets: Secondary | ICD-10-CM | POA: Insufficient documentation

## 2022-04-11 DIAGNOSIS — I480 Paroxysmal atrial fibrillation: Secondary | ICD-10-CM

## 2022-04-11 DIAGNOSIS — I2510 Atherosclerotic heart disease of native coronary artery without angina pectoris: Secondary | ICD-10-CM | POA: Insufficient documentation

## 2022-04-11 DIAGNOSIS — R55 Syncope and collapse: Secondary | ICD-10-CM | POA: Diagnosis not present

## 2022-04-11 DIAGNOSIS — I5022 Chronic systolic (congestive) heart failure: Secondary | ICD-10-CM | POA: Diagnosis present

## 2022-04-11 DIAGNOSIS — R0683 Snoring: Secondary | ICD-10-CM | POA: Diagnosis not present

## 2022-04-11 DIAGNOSIS — E1151 Type 2 diabetes mellitus with diabetic peripheral angiopathy without gangrene: Secondary | ICD-10-CM | POA: Insufficient documentation

## 2022-04-11 DIAGNOSIS — E1122 Type 2 diabetes mellitus with diabetic chronic kidney disease: Secondary | ICD-10-CM | POA: Insufficient documentation

## 2022-04-11 DIAGNOSIS — I130 Hypertensive heart and chronic kidney disease with heart failure and stage 1 through stage 4 chronic kidney disease, or unspecified chronic kidney disease: Secondary | ICD-10-CM | POA: Insufficient documentation

## 2022-04-11 DIAGNOSIS — Z7901 Long term (current) use of anticoagulants: Secondary | ICD-10-CM | POA: Diagnosis not present

## 2022-04-11 DIAGNOSIS — Z79899 Other long term (current) drug therapy: Secondary | ICD-10-CM | POA: Diagnosis not present

## 2022-04-11 DIAGNOSIS — I25810 Atherosclerosis of coronary artery bypass graft(s) without angina pectoris: Secondary | ICD-10-CM | POA: Insufficient documentation

## 2022-04-11 DIAGNOSIS — Z720 Tobacco use: Secondary | ICD-10-CM | POA: Diagnosis not present

## 2022-04-11 DIAGNOSIS — I255 Ischemic cardiomyopathy: Secondary | ICD-10-CM

## 2022-04-11 LAB — BASIC METABOLIC PANEL
Anion gap: 12 (ref 5–15)
BUN: 16 mg/dL (ref 6–20)
CO2: 24 mmol/L (ref 22–32)
Calcium: 8.9 mg/dL (ref 8.9–10.3)
Chloride: 99 mmol/L (ref 98–111)
Creatinine, Ser: 1.36 mg/dL — ABNORMAL HIGH (ref 0.61–1.24)
GFR, Estimated: >60 mL/min (ref >60)
Glucose, Bld: 293 mg/dL — ABNORMAL HIGH (ref 70–99)
Potassium: 4.3 mmol/L (ref 3.5–5.1)
Sodium: 135 mmol/L (ref 135–145)

## 2022-04-11 MED ORDER — ENTRESTO 24-26 MG PO TABS: 1.0000 | ORAL_TABLET | Freq: Two times a day (BID) | ORAL | 1 refills | Status: DC

## 2022-04-11 NOTE — Telephone Encounter (Signed)
Addendum:  ? ?04/11/2022 0945: Spoke with patient regarding HV TOC follow up appt today. Pt states he does not have transportation. Offered to reschedule patient for this afternoon. Pt would like to reschedule for later in the week. Pt states he has gained 10 pounds since hospital discharge. Encouraged pt to attend HV TOC appt today if at all possible. Pt states he could have transportation for 2pm. Adjusted appointment for today at 2pm.  ? ?Confirmed transportation and directions prior to ending call.  ? ?Ozella Rocks, MSN, RN ? ?

## 2022-04-11 NOTE — Patient Instructions (Signed)
Medication Changes: ? ?STOP enalapril.  ? ?START Entresto 24-26mg , twice a day. START ON 04/13/2022 ? ?Lab Work: ? ?Labs done today, your results will be available in MyChart, we will contact you for abnormal readings. ? ?Testing/Procedures: ? ?Your physician has requested that you regularly monitor and record your blood pressure readings at home. Please use the same machine at the same time of day to check your readings and record them to bring to your follow-up visit. You have been given a prescription for a blood pressure cuff which medicaid should cover the cost. Please give to your providing pharmacy.  ? ? ? ?Your physician has recommended that you have a sleep study. This test records several body functions during sleep, including: brain activity, eye movement, oxygen and carbon dioxide blood levels, heart rate and rhythm, breathing rate and rhythm, the flow of air through your mouth and nose, snoring, body muscle movements, and chest and belly movement. ? ? ?Referrals: ? ?You have been referred to the [facility].  ? ?Special Instructions // Education: ? ?Do the following things EVERYDAY: ?Weigh yourself in the morning before breakfast. Write it down and keep it in a log. ?Take your medicines as prescribed ?Eat low salt foods--Limit salt (sodium) to 2000 mg per day.  ?Stay as active as you can everyday ?Limit all fluids for the day to less than 2 liters ? ?Limit your fluid intake to less than 2 liters of fluid per day. Fluid includes all drinks, coffee, juice, ice chips, soup, jello, and all other liquids. ? ?Follow-Up in: 2 weeks with pharmacy clinic.  ?4 weeks with Advanced Heart Failure clinic.  ? ?At the [facility], you and your health needs are our priority. We have a designated team specialized in the treatment of Heart Failure. This Care Team includes your primary Heart Failure Specialized Cardiologist (physician), Advanced Practice Providers (APPs- Physician  Assistants and Nurse Practitioners), and Pharmacist who all work together to provide you with the care you need, when you need it.  ? ?You may see any of the following providers on your designated Care Team at your next follow up: ? ?Dr Glori Bickers ?Dr Loralie Champagne ?Darrick Grinder, NP ?Lyda Jester, PA ?Jessica Milford,NP ?Marlyce Huge, PA ?Audry Riles, PharmD ? ? ?Please be sure to bring in all your medications bottles to every appointment.  ? ?Need to Contact us: ? ?If you have any questions or concerns before your next appointment please send [place] a message through [place] or call our office at [phone].   ? ?TO LEAVE A MESSAGE FOR THE NURSE SELECT OPTION 2, PLEASE LEAVE A MESSAGE INCLUDING: ?YOUR NAME ?DATE OF BIRTH ?CALL BACK NUMBER ?REASON FOR CALL**this is important as we prioritize the call backs ? ?YOU WILL RECEIVE A CALL BACK THE SAME DAY AS LONG AS YOU CALL BEFORE 4:00 PM ? ? ?

## 2022-04-11 NOTE — Progress Notes (Signed)
? ? ?HEART & VASCULAR TRANSITION OF CARE CONSULT NOTE  ? ? ? ?Referring Physician: Dr Angelena Form  ?Primary Care: dr Jeanie Cooks ?Primary Cardiologist:Dr McAlhany  ? ?HPI: ?Referred to clinic by  for heart failure consultation.  ? ?Mr Slomka is a 56 year old with PAD, CAD, PAF, tobacco abuse, and chronic HFrEF. He underwent 3V CABG 05/25/20. (LIMA to LAD, SVG to Diagonal and SVG to RCA). His post-operative course was complicated by atrial fibrillation and he was started on amiodarone. Echo with LVEF->30-35% with apical thrombus. He was discharged on Eliquis. Echo post bypass improved LVEF->40-45%.  ? ?On 02/22/22 saw Dr. Ali Lowe and reported chest pain. Imdur was started. He was then admitted  to [facility] ([place] on 03/20/22 with chest pain and NSTEMI. Cardiac cath at [facility] April 2023 with severe disease in the SVG to RCA treated with a drug eluting stent and clot embolization to distal PDA.  Says he was short of breath when he being discharged.  ? ?Admitted 03/29/2022 to [facility] with increased shortness of breath.  He had episode of presyncope when standing. Concerned he was orthostatic  due to imdur. Imdur was stopped and the symptoms resolved. Diuresed with IV lasix.  Discharged on 03/31/22.  Weigtht at that time was 232 pounds.   ? ?Saw Dr Angelena Form on 04/03/22. Had zio patch placed. Plan for ECHO 4 months.  ? ?Yesterday Zio Patch fell off. He plans to mail in today.  ? ?Today he returns for HF follow up. Last night he took extra 40 mg po lasix. Says his weight went up to 231 pounds but after taking extra lasix his weight went down to 226 pounds.  Overall feeling ok but feels like he is getting fluid again. Says he has to take breaks when he is walking. Denies PND/Orthopnea. Denies dizziness. Appetite ok. Had fast food on the way to his appointment. Drinks fluids constantly. No fever or chills. Has hard time sleeping.  Weight at home trending up 215-->231 pounds. Smoking 5 cigarettes per day.  Taking all medications. Stopped taking potassium. Lives with his girlfriend. Disabled since 2013. He does not drive.  ? ?Labs 03/31/22 K 3.7  ?Labs 03/30/22 K 3.7 Creatinine 1.45  ? ?Cardiac Testing  ?Echo 03/2022 EF 30-35% RV moderately reduced grade III DD. AK LV,  ?Echo 09/2020 EF 50-55% LV thrombus ?Echo 05/2020  EF 30-35%  ? ?LHC 03/2022  ?-  severe disease in the SVG to RCA treated with a drug eluting stent. ? ?[place] 2021 ?Ost 3rd Mrg to 3rd Mrg lesion is 100% stenosed. ?Mid RCA lesion is 60% stenosed. ?3rd Mrg lesion is 100% stenosed. ?Prox LAD to Mid LAD lesion is 80% stenosed. ?1st Diag lesion is 80% stenosed. ?The left ventricular ejection fraction is 25-35% by visual estimate. ?There is moderate left ventricular systolic dysfunction. ?There is no mitral valve regurgitation.  ?1. Severe restenosis mid LAD stented segment ?2. Severe stenosis ostium of the Diagonal branch, jailed by the stent ?3. Chronic occlusion of the third OM branch, filling from collaterals ?4. Moderate mid RCA stenosis.  ?5. Moderate segmental LV systolic dysfunction ? ?Review of Systems: [y] = yes, [ ]  = no  ? ?General: Weight gain [Y ]; Weight loss [ ] ; Anorexia [ ] ; Fatigue [Y ]; Fever [ ] ; Chills [ ] ; Weakness [ ]   ?Cardiac: Chest pain/pressure [ ] ; Resting SOB [ ] ; Exertional SOB [ Y]; Orthopnea [ ] ; Pedal Edema [ ] ; Palpitations [ ] ; Syncope [ ] ;  Presyncope [ ] ; Paroxysmal nocturnal dyspnea[ ]   ?Pulmonary: Cough [ ] ; Wheezing[ ] ; Hemoptysis[ ] ; Sputum [ ] ; Snoring [ ]   ?GI: Vomiting[ ] ; Dysphagia[ ] ; Melena[ ] ; Hematochezia [ ] ; Heartburn[ ] ; Abdominal pain [ ] ; Constipation [ ] ; Diarrhea [ ] ; BRBPR [ ]   ?GU: Hematuria[ ] ; Dysuria [ ] ; Nocturia[ ]   ?Vascular: Pain in legs with walking [ ] ; Pain in feet with lying flat [ ] ; Non-healing sores [ ] ; Stroke [ ] ; TIA [ ] ; Slurred speech [ ] ;  ?Neuro: Headaches[ ] ; Vertigo[ ] ; Seizures[ ] ; Paresthesias[ ] ;Blurred vision [ ] ; Diplopia [ ] ; Vision changes [ ]   ?Ortho/Skin: Arthritis [ ] ; Joint  pain [ Y]; Muscle pain [ ] ; Joint swelling [ ] ; Back Pain [ Y]; Rash [ ]   ?Psych: Depression[ ] ; Anxiety[ ]   ?Heme: Bleeding problems [ ] ; Clotting disorders [ ] ; Anemia [ ]   ?Endocrine: Diabetes [ Y]; Thyroid dysfunction[ ]  ? ? ?Past Medical History:  ?Diagnosis Date  ? Acute myocardial infarction of other anterior wall, subsequent episode of care 12/12/2011  ? Anal fissure   ? Arthritis   ? back  ? Atherosclerosis of native artery of both lower extremities with intermittent claudication ([place]) 04/01/2018  ? Bruises easily   ? d/t being on Effient  ? CAD (coronary artery disease)   ? A. Acute Ant STEMI 12/12/2011   ? Cardiomyopathy secondary 12/26/2011  ? Chronic systolic CHF (congestive heart failure) ([place])   ? ischemic CM // Echo 6/21: EF 30-35  //  Echocardiogram 10/21: LV apical thrombus resolved, EF 50-55, ant and apical, ant-lat HK, trivial MR, trivial AI  ? Chronic total occlusion of artery of the extremities ([place]) 02/26/2012  ? Claudication ([place]) 12/26/2011  ? Coronary atherosclerosis of native coronary artery   ? a. ant STEMI with cardiac arrest 2013 s/p DES to mLAD. b. [place] 10/2016 s/p PTCA to mLAD.  ? Diabetes mellitus without complication ([place])   ? Essential hypertension 10/23/2016  ? GERD (gastroesophageal reflux disease)   ? "takes tums"  ? History of blood transfusion   ? no abnormal reaction noted  ? History of kidney stones   ? Hypertension   ? Ischemic cardiomyopathy   ? a. EF 35% in 02/2012 at time of acute MI, improved to normal on subsequent imaging.  ? MI (myocardial infarction) ([place])   ? AMI [id number] - complicated by VT/[place]  ? Mixed hyperlipidemia   ? Numbness and tingling of right arm 03/31/2013  ? Occlusion and stenosis of carotid artery without mention of cerebral infarction 08/26/2012  ? PAD (peripheral artery disease) ([place])   ? followed by Vascular - aortobifem bypass 01/2013 & left carotid-subclavian artery bypass 04/2013  ? Peripheral vascular disease, unspecified ([place]) 12/16/2012  ? PVD (peripheral  vascular disease) ([place]) 03/31/2013  ? Rectal polyp   ? Subclavian steal syndrome 08/11/2013  ? Uncontrolled diabetes mellitus 10/23/2016  ? Wears partial dentures   ? upper  ? ? ?Current Outpatient Medications  ?Medication Sig Dispense Refill  ? aspirin EC 81 MG tablet Take 1 tablet (81 mg total) by mouth daily. 90 tablet 3  ? carvedilol (COREG) 25 MG tablet Take 1 tablet (25 mg total) by mouth 2 (two) times daily. 180 tablet 3  ? cetirizine (ZYRTEC) 10 MG tablet Take 10 mg by mouth daily.    ? clopidogrel (PLAVIX) 75 MG tablet Take 75 mg by mouth daily.    ? cyclobenzaprine (FLEXERIL) 10 MG tablet Take 1 tablet (10 mg total)  by mouth 2 (two) times daily as needed for muscle spasms. 30 tablet 0  ? diazepam (VALIUM) 5 MG tablet Take 5 mg by mouth daily as needed for anxiety.    ? ELIQUIS 5 MG TABS tablet TAKE 1 TABLET BY MOUTH 2 TIMES A DAY 180 tablet 1  ? enalapril (VASOTEC) 5 MG tablet Take 1 tablet (5 mg total) by mouth daily. 60 tablet 2  ? FARXIGA 10 MG TABS tablet Take 10 mg by mouth daily.    ? furosemide (LASIX) 40 MG tablet Take 1 tablet (40 mg total) by mouth daily. 90 tablet 3  ? gabapentin (NEURONTIN) 300 MG capsule Take 300 mg by mouth 3 (three) times daily.    ? insulin glargine (LANTUS) 100 UNIT/ML Solostar Pen Inject 50 Units into the skin 2 (two) times daily.    ? insulin lispro (HUMALOG) 100 UNIT/ML injection Inject 2-15 Units into the skin 3 (three) times daily before meals. sliding scale .    ? naloxone (NARCAN) nasal spray 4 mg/0.1 mL Place 4 mg into the nose once as needed (overdose).    ? nitroGLYCERIN (NITROSTAT) 0.4 MG SL tablet Place 0.4 mg under the tongue every 5 (five) minutes as needed for chest pain.    ? oxyCODONE-acetaminophen (PERCOCET) 10-325 MG tablet Take 1 tablet by mouth 4 (four) times daily.    ? pantoprazole (PROTONIX) 40 MG tablet Take 40 mg by mouth daily.    ? rosuvastatin (CRESTOR) 20 MG tablet Take 2 tablets (40 mg total) by mouth daily. 180 tablet 3  ? potassium chloride  (KLOR-CON) 10 MEQ tablet Take 1 tablet (10 mEq total) by mouth daily. (Patient not taking: Reported on 04/11/2022) 30 tablet 6  ? ?No current facility-administered medications for this encounter.  ? ? ?Allergie

## 2022-04-11 NOTE — Telephone Encounter (Signed)
Call attempted to confirm HV TOC appt 04/11/2022 @ 10 am. HIPPA appropriate VM left with callback number.   ? ? ?Rhae Hammock, BSN, RN ?Heart Failure Nurse Navigator ?Secure Chat Only  ?

## 2022-04-11 NOTE — Telephone Encounter (Signed)
Advanced Heart Failure Patient Advocate Encounter ? ?Prior Authorization for Delene Loll has been submitted and approved.   ? ?PA# [id number] ?Effective dates: 04/11/22 through 04/11/23 ? ?Charlann Boxer, CPhT ? ? ?

## 2022-04-17 ENCOUNTER — Ambulatory Visit: Payer: Medicaid Other | Admitting: Cardiovascular Disease

## 2022-04-27 ENCOUNTER — Other Ambulatory Visit: Payer: Self-pay | Admitting: Physician Assistant

## 2022-05-03 ENCOUNTER — Inpatient Hospital Stay (HOSPITAL_COMMUNITY): Admission: RE | Admit: 2022-05-03 | Payer: Medicaid Other | Source: Ambulatory Visit

## 2022-05-08 ENCOUNTER — Other Ambulatory Visit (HOSPITAL_COMMUNITY): Payer: Self-pay | Admitting: Adult Health

## 2022-05-16 ENCOUNTER — Telehealth (HOSPITAL_COMMUNITY): Payer: Self-pay

## 2022-05-16 NOTE — Telephone Encounter (Signed)
Called to confirm/remind patient of their appointment at the [facility] on 05/17/22. However, patient requested to cancel his appointment for tomorrow.  Patient appointment has been rescheduled to 06/16/22 at 2:00 pm.  Patient reminded to bring all medications and/or complete list.  Confirmed patient has transportation. Gave directions, instructed to utilize valet parking.  Confirmed appointment prior to ending call.

## 2022-05-17 ENCOUNTER — Encounter (HOSPITAL_COMMUNITY): Payer: Medicaid Other

## 2022-05-21 ENCOUNTER — Encounter (HOSPITAL_BASED_OUTPATIENT_CLINIC_OR_DEPARTMENT_OTHER): Payer: Medicaid Other | Admitting: Cardiology

## 2022-05-23 ENCOUNTER — Encounter (HOSPITAL_BASED_OUTPATIENT_CLINIC_OR_DEPARTMENT_OTHER): Payer: Medicaid Other | Admitting: Cardiology

## 2022-05-24 ENCOUNTER — Other Ambulatory Visit (HOSPITAL_COMMUNITY): Payer: Self-pay | Admitting: Family Medicine

## 2022-06-15 ENCOUNTER — Telehealth (HOSPITAL_COMMUNITY): Payer: Self-pay

## 2022-06-15 NOTE — Telephone Encounter (Signed)
Called to confirm/remind patient of their appointment at the [facility] on 06/16/22.   Patient reminded to bring all medications and/or complete list.  Confirmed patient has transportation. Gave directions, instructed to utilize valet parking.  Confirmed appointment prior to ending call.

## 2022-06-16 ENCOUNTER — Encounter (HOSPITAL_COMMUNITY): Payer: Medicaid Other

## 2022-06-30 ENCOUNTER — Ambulatory Visit (HOSPITAL_BASED_OUTPATIENT_CLINIC_OR_DEPARTMENT_OTHER): Payer: Medicaid Other | Attending: Adult Health | Admitting: Cardiology

## 2022-07-04 ENCOUNTER — Ambulatory Visit (HOSPITAL_COMMUNITY): Payer: Medicaid Other | Attending: Cardiovascular Disease

## 2022-07-04 DIAGNOSIS — R55 Syncope and collapse: Secondary | ICD-10-CM | POA: Insufficient documentation

## 2022-07-04 DIAGNOSIS — I255 Ischemic cardiomyopathy: Secondary | ICD-10-CM | POA: Insufficient documentation

## 2022-07-04 MED ORDER — PERFLUTREN LIPID MICROSPHERE
1.0000 mL | INTRAVENOUS | Status: AC | PRN
  Administered 2022-07-04: 3 mL via INTRAVENOUS

## 2022-07-05 LAB — ECHOCARDIOGRAM COMPLETE
Area-P 1/2: 4.49 cm2
S' Lateral: 3.9 cm

## 2022-07-24 ENCOUNTER — Encounter (HOSPITAL_COMMUNITY): Payer: Medicaid Other

## 2022-08-04 ENCOUNTER — Other Ambulatory Visit (HOSPITAL_COMMUNITY): Payer: Medicaid Other

## 2022-08-04 ENCOUNTER — Other Ambulatory Visit (HOSPITAL_COMMUNITY): Payer: Self-pay | Admitting: Adult Health

## 2022-08-25 ENCOUNTER — Telehealth (HOSPITAL_COMMUNITY): Payer: Self-pay

## 2022-08-25 NOTE — Telephone Encounter (Signed)
Called to confirm/remind patient of their appointment at the [facility] on 08/28/22.   Patient reminded to bring all medications and/or complete list.  Confirmed patient has transportation. Gave directions, instructed to utilize valet parking.  Confirmed appointment prior to ending call.   

## 2022-08-28 ENCOUNTER — Inpatient Hospital Stay (HOSPITAL_COMMUNITY)
Admission: RE | Admit: 2022-08-28 | Discharge: 2022-08-28 | Disposition: A | Discharge status: Home / Self Care | Payer: Medicaid Other | Source: Ambulatory Visit

## 2022-08-30 ENCOUNTER — Other Ambulatory Visit: Payer: Self-pay | Admitting: Cardiovascular Disease

## 2022-08-30 ENCOUNTER — Other Ambulatory Visit: Payer: Self-pay | Admitting: *Deleted

## 2022-08-30 DIAGNOSIS — I4891 Unspecified atrial fibrillation: Secondary | ICD-10-CM

## 2022-08-30 DIAGNOSIS — G458 Other transient cerebral ischemic attacks and related syndromes: Secondary | ICD-10-CM

## 2022-08-30 DIAGNOSIS — I513 Intracardiac thrombosis, not elsewhere classified: Secondary | ICD-10-CM

## 2022-08-30 DIAGNOSIS — I739 Peripheral vascular disease, unspecified: Secondary | ICD-10-CM

## 2022-08-30 NOTE — Telephone Encounter (Signed)
Eliquis 5mg  refill request received. Patient is 56 years old, weight-102.7kg, Crea-1.36 on 04/11/22, Diagnosis-Afib & Lv Thrombus, and last seen by Dr. Angelena Form on 04/03/2022. Dose is appropriate based on dosing criteria. Will send in refill to requested pharmacy.

## 2022-09-04 ENCOUNTER — Ambulatory Visit: Payer: Medicaid Other | Admitting: Cardiovascular Disease

## 2022-09-07 NOTE — Progress Notes (Deleted)
HISTORY AND PHYSICAL     CC:  follow up. Requesting Provider:  Nolene Ebbs, MD  HPI: This is a 56 y.o. male who is here today for follow up and is pt of Dr. Trula Slade and has hx of  ABF bypass in 2014.  He developed subclavian steal with left arm claudication and had a left carotid subclavian bypass 04/03/2013 and had to return to the operating room for drainage of lymphocele.  Pt also has hx of claudication and had balloon angioplasty of the right CFA but did not have significant improvement in his sx.  On 03/13/2018, he underwent right femoral to above knee popliteal bypass with GSV for claudication by Dr. Trula Slade.    Pt has hx of MI in 2013 with hx of drug eluting stent to LAD.  He has hx of coronary angioplasty in 2017.  He did have CABG on 05/05/2020. He has hx of PAF and is on Apixaban. He also has hx of LV apical thrombus that has since resolved. He is to continue Apixaban per cardiology.  On 02/22/22 saw Dr. Ali Lowe and reported chest pain. Imdur was started. He was then admitted  to [facility] ([place] on 03/20/22 with chest pain and NSTEMI. Cardiac cath at [facility] April 2023 with severe disease in the SVG to RCA treated with a drug eluting stent and clot embolization to distal PDA.  He was admitted to [facility] in April 2023 with increasing shortness of breath with an  episode of presyncope when standing. Concerned he was orthostatic  due to imdur. Imdur was stopped and the symptoms resolved. Diuresed with IV lasix.  Echo in [place] 2023 revealed EF of 30-35%.  Pt was last seen on 06/13/2021.  At that time, he was having increasing left leg pain with walking.  It was the same pain as before his bypass.  He could only walk about 30 feet before he had to stop and rest.   He was not having any neurologic sx.   He was scheduled for angiogram on 06/28/2021 and underwent angioplasty of the right CFA for ~ 80% stenosis. His right femoral to popliteal bypass and left SFA stents  were widely patent.  He was to take plavix x one month.    The pt returns today for follow up.    Pt *** any amaurosis fugax, speech difficulties, weakness, numbness, paralysis or clumsiness or facial droop.    Pt *** claudication, ***rest pain, or ***non healing wounds.    The pt is on a statin for cholesterol management.    The pt is on an aspirin.    Other AC:  Eliquis/Plavix*** The pt is on ARB for hypertension.  The pt does  have diabetes. Tobacco hx:  ***  Pt does *** have family hx of AAA.    Past Medical History:  Diagnosis Date   Acute myocardial infarction of other anterior wall, subsequent episode of care 12/12/2011   Anal fissure    Arthritis    back   Atherosclerosis of native artery of both lower extremities with intermittent claudication ([place]) 04/01/2018   Bruises easily    d/t being on Effient   CAD (coronary artery disease)    A. Acute Ant STEMI 12/12/2011    Cardiomyopathy secondary 04/20/6159   Chronic systolic CHF (congestive heart failure) ([place])    ischemic CM // Echo 6/21: EF 30-35  //  Echocardiogram 10/21: LV apical thrombus resolved, EF 50-55, ant and apical, ant-lat HK, trivial  MR, trivial AI   Chronic total occlusion of artery of the extremities ([place]) 02/26/2012   Claudication ([place]) 12/26/2011   Coronary atherosclerosis of native coronary artery    a. ant STEMI with cardiac arrest 2013 s/p DES to mLAD. b. [place] 10/2016 s/p PTCA to mLAD.   Diabetes mellitus without complication ([place])    Essential hypertension 10/23/2016   GERD (gastroesophageal reflux disease)    "takes tums"   History of blood transfusion    no abnormal reaction noted   History of kidney stones    Hypertension    Ischemic cardiomyopathy    a. EF 35% in 02/2012 at time of acute MI, improved to normal on subsequent imaging.   MI (myocardial infarction) ([place])    AMI [id number] - complicated by VT/[place]   Mixed hyperlipidemia    Numbness and tingling of right arm 03/31/2013   Occlusion and  stenosis of carotid artery without mention of cerebral infarction 08/26/2012   PAD (peripheral artery disease) ([place])    followed by Vascular - aortobifem bypass 01/2013 & left carotid-subclavian artery bypass 04/2013   Peripheral vascular disease, unspecified ([place]) 12/16/2012   PVD (peripheral vascular disease) ([place]) 03/31/2013   Rectal polyp    Subclavian steal syndrome 08/11/2013   Uncontrolled diabetes mellitus 10/23/2016   Wears partial dentures    upper    Past Surgical History:  Procedure Laterality Date   ABDOMINAL AORTOGRAM W/LOWER EXTREMITY N/A 06/26/2017   Procedure: Abdominal Aortogram w/Lower Extremity;  Surgeon: Serafina Mitchell, MD;  Location: [place] [facility];  Service: Cardiovascular;  Laterality: N/A;   ABDOMINAL AORTOGRAM W/LOWER EXTREMITY N/A 06/28/2021   Procedure: ABDOMINAL AORTOGRAM W/LOWER EXTREMITY;  Surgeon: Serafina Mitchell, MD;  Location: [place] [facility];  Service: Cardiovascular;  Laterality: N/A;   ANAL FISSURECTOMY     AORTA - BILATERAL FEMORAL ARTERY BYPASS GRAFT N/A 01/16/2013   Procedure: AORTA BIFEMORAL BYPASS GRAFT;  Surgeon: Serafina Mitchell, MD;  Location: [place];  Service: Vascular;  Laterality: N/A;   APPENDECTOMY     CARDIAC CATHETERIZATION N/A 10/23/2016   Procedure: Left Heart Cath and Coronary Angiography;  Surgeon: Burnell Blanks, MD;  Location: [place] [facility];  Service: Cardiovascular;  Laterality: N/A;   CARDIAC CATHETERIZATION N/A 10/23/2016   Procedure: Coronary Balloon Angioplasty;  Surgeon: Burnell Blanks, MD;  Location: [place] [facility];  Service: Cardiovascular;  Laterality: N/A;   CAROTID-SUBCLAVIAN BYPASS GRAFT Left 04/03/2013   Procedure: BYPASS GRAFT CAROTID-SUBCLAVIAN;  Surgeon: Serafina Mitchell, MD;  Location: [place];  Service: Vascular;  Laterality: Left;   CIRCUMCISION N/A 12/15/2015   Procedure: CIRCUMCISION ADULT;  Surgeon: Cleon Gustin, MD;  Location: [facility];  Service: Urology;  Laterality: N/A;   COLONOSCOPY      CORONARY ANGIOPLASTY     stent placed Dec 12, 2011 and 2018   CORONARY ARTERY BYPASS GRAFT N/A 05/05/2020   Procedure: CORONARY ARTERY BYPASS GRAFTING (CABG) x Three, Using Left internal Mammary Artery and Left Leg greater saphenous vein harvested endoscopically;  Surgeon: Grace Isaac, MD;  Location: [place];  Service: Open Heart Surgery;  Laterality: N/A;   CORONARY BALLOON ANGIOPLASTY N/A 05/23/2019   Procedure: CORONARY BALLOON ANGIOPLASTY;  Surgeon: Burnell Blanks, MD;  Location: [place] [facility];  Service: Cardiovascular;  Laterality: N/A;   DIAGNOSTIC LAPAROSCOPY     ENDARTERECTOMY FEMORAL Right 03/13/2018   Procedure: FEMORAL ENDARTERECTOMY RIGHT;  Surgeon: Serafina Mitchell, MD;  Location: [place];  Service: Vascular;  Laterality: Right;   FEMORAL-POPLITEAL BYPASS GRAFT Right 03/13/2018   Procedure: FEMORAL-POPLITEAL ARTERY BYPASS WITH NON-REVERSE VEIN RIGHT;  Surgeon: Serafina Mitchell, MD;  [facility];  Service: Vascular;  Laterality: Right;   GROIN DISSECTION Right 03/13/2018   Procedure: RE-DO COMMON FEMORAL ARTERY EXPOSURE;  Surgeon: Serafina Mitchell, MD;  Location: [facility];  Service: Vascular;  Laterality: Right;   I & D EXTREMITY Left 04/18/2013   Procedure: debridement of left neck lymphocele;  Surgeon: Serafina Mitchell, MD;  Location: [place];  Service: Vascular;  Laterality: Left;  I and D of left neck   I & D EXTREMITY Right 11/21/2014   Procedure: IRRIGATION AND DEBRIDEMENT RIGHT HAND;  Surgeon: Roseanne Kaufman, MD;  Location: [place];  Service: Orthopedics;  Laterality: Right;   INTRAVASCULAR PRESSURE WIRE/FFR STUDY N/A 04/22/2020   Procedure: INTRAVASCULAR PRESSURE WIRE/FFR STUDY;  Surgeon: Burnell Blanks, MD;  Location: [place] [facility];  Service: Cardiovascular;  Laterality: N/A;   LEFT HEART CATH AND CORONARY ANGIOGRAPHY N/A 05/23/2019   Procedure: LEFT HEART CATH AND CORONARY ANGIOGRAPHY;  Surgeon: Burnell Blanks, MD;  Location: [place] [facility]  [facility];  Service: Cardiovascular;  Laterality: N/A;   LEFT HEART CATH AND CORONARY ANGIOGRAPHY N/A 04/22/2020   Procedure: LEFT HEART CATH AND CORONARY ANGIOGRAPHY;  Surgeon: Burnell Blanks, MD;  Location: [place] [facility];  Service: Cardiovascular;  Laterality: N/A;   LEFT HEART CATHETERIZATION WITH CORONARY ANGIOGRAM N/A 12/12/2011   Procedure: LEFT HEART CATHETERIZATION WITH CORONARY ANGIOGRAM;  Surgeon: Burnell Blanks, MD;  Location: [facility] C[facility] LAB;  Service: Cardiovascular;  Laterality: N/A;   LOWER EXTREMITY ANGIOGRAM N/A 01/31/2012   Procedure: LOWER EXTREMITY ANGIOGRAM;  Surgeon: Burnell Blanks, MD;  Location: [facility] CATH LAB;  Service: Cardiovascular;  Laterality: N/A;   LOWER EXTREMITY ANGIOGRAPHY N/A 04/23/2018   Procedure: LOWER EXTREMITY ANGIOGRAPHY;  Surgeon: Serafina Mitchell, MD;  Location: [place] [facility];  Service: Cardiovascular;  Laterality: N/A;   PERCUTANEOUS CORONARY STENT INTERVENTION (PCI-S) Right 12/12/2011   Procedure: PERCUTANEOUS CORONARY STENT INTERVENTION (PCI-S);  Surgeon: Burnell Blanks, MD;  Location: [facility] C[facility];  Service: Cardiovascular;  Laterality: Right;   PERIPHERAL VASCULAR BALLOON ANGIOPLASTY Right 06/28/2021   Procedure: PERIPHERAL VASCULAR BALLOON ANGIOPLASTY;  Surgeon: Serafina Mitchell, MD;  Location: [place] [facility];  Service: Cardiovascular;  Laterality: Right;  common femoral (graft)   PERIPHERAL VASCULAR INTERVENTION Left 04/23/2018   Procedure: PERIPHERAL VASCULAR INTERVENTION;  Surgeon: Serafina Mitchell, MD;  Location: [place] [facility];  Service: Cardiovascular;  Laterality: Left;  superficial femoral   REPAIR EXTENSOR TENDON Right 11/21/2014   Procedure: WITH REPAIR/RECONSTRUCTION OF EXTENSOR TENDONS AS NEEDED;  Surgeon: Roseanne Kaufman, MD;  Location: [place];  Service: Orthopedics;  Laterality: Right;   TEE WITHOUT CARDIOVERSION N/A 05/05/2020   Procedure: TRANSESOPHAGEAL ECHOCARDIOGRAM (TEE);  Surgeon: Grace Isaac, MD;   Location: [place];  Service: Open Heart Surgery;  Laterality: N/A;    Allergies  Allergen Reactions   Hydrocodone Hives and Nausea And Vomiting   Metformin     Other reaction(s): Other (See Comments) Water blisters between fingers    Current Outpatient Medications  Medication Sig Dispense Refill   aspirin EC 81 MG tablet Take 1 tablet (81 mg total) by mouth daily. 90 tablet 3   carvedilol (COREG) 25 MG tablet Take 1 tablet (25 mg total) by mouth 2 (two) times daily. 180 tablet 3   cetirizine (ZYRTEC) 10 MG tablet Take  10 mg by mouth daily.     clopidogrel (PLAVIX) 75 MG tablet Take 75 mg by mouth daily.     cyclobenzaprine (FLEXERIL) 10 MG tablet Take 1 tablet (10 mg total) by mouth 2 (two) times daily as needed for muscle spasms. 30 tablet 0   diazepam (VALIUM) 5 MG tablet Take 5 mg by mouth daily as needed for anxiety.     ELIQUIS 5 MG TABS tablet TAKE 1 TABLET BY MOUTH 2 TIMES A DAY 180 tablet 1   ENTRESTO 24-26 MG TAKE 1 TABLET BY MOUTH TWICE DAILY (START ON 04-13-22) 60 tablet 1   FARXIGA 10 MG TABS tablet Take 10 mg by mouth daily.     furosemide (LASIX) 40 MG tablet TAKE 1 TABLET BY MOUTH EACH DAY 90 tablet 3   gabapentin (NEURONTIN) 300 MG capsule Take 300 mg by mouth 3 (three) times daily.     insulin glargine (LANTUS) 100 UNIT/ML Solostar Pen Inject 50 Units into the skin 2 (two) times daily.     insulin lispro (HUMALOG) 100 UNIT/ML injection Inject 2-15 Units into the skin 3 (three) times daily before meals. sliding scale .     naloxone (NARCAN) nasal spray 4 mg/0.1 mL Place 4 mg into the nose once as needed (overdose).     nitroGLYCERIN (NITROSTAT) 0.4 MG SL tablet Place 0.4 mg under the tongue every 5 (five) minutes as needed for chest pain.     oxyCODONE-acetaminophen (PERCOCET) 10-325 MG tablet Take 1 tablet by mouth 4 (four) times daily.     pantoprazole (PROTONIX) 40 MG tablet Take 40 mg by mouth daily.     rosuvastatin (CRESTOR) 20 MG tablet Take 2 tablets (40 mg total)  by mouth daily. 180 tablet 3   No current facility-administered medications for this visit.    Family History  Problem Relation Age of Onset   Cancer Father        Lung   Diabetes Mother    COPD Mother    Cancer Maternal Uncle        Colon    Social History   Socioeconomic History   Marital status: Divorced    Spouse name: Not on file   Number of children: 1   Years of education: Not on file   Highest education level: High school graduate  Occupational History    Employer: OTHER   Occupation: Disablity    Comment: Since 2013  Tobacco Use   Smoking status: Some Days    Packs/day: 0.25    Years: 25.00    Total pack years: 6.25    Types: Cigarettes   Smokeless tobacco: Never   Tobacco comments:    Smoking cessation  Vaping Use   Vaping Use: Never used  Substance and Sexual Activity   Alcohol use: Yes    Alcohol/week: 2.0 standard drinks of alcohol    Types: 2 Cans of beer per week    Comment: socially   Drug use: No   Sexual activity: Yes  Other Topics Concern   Not on file  Social History Narrative   Disabled - no longer works   Social Determinants of Radio broadcast assistant Strain: [place]  (03/30/2022)   Overall Financial Resource Strain (CARDIA)    Difficulty of Paying Living Expenses: Not very hard  Food Insecurity: No Food Insecurity (03/30/2022)   Hunger Vital Sign    Worried About Running Out of Food in the Last Year: Never true    Ran Out  of Food in the Last Year: Never true  Transportation Needs: No Transportation Needs (03/30/2022)   PRAPARE - Hydrologist (Medical): No    Lack of Transportation (Non-Medical): No  Physical Activity: Not on file  Stress: Not on file  Social Connections: Not on file  Intimate Partner Violence: Not on file     REVIEW OF SYSTEMS:  *** [X]  denotes positive finding, [ ]  denotes negative finding Cardiac  Comments:  Chest pain or chest pressure:    Shortness of breath upon  exertion:    Short of breath when lying flat:    Irregular heart rhythm:        Vascular    Pain in calf, thigh, or hip brought on by ambulation:    Pain in feet at night that wakes you up from your sleep:     Blood clot in your veins:    Leg swelling:         Pulmonary    Oxygen at home:    Wheezing:         Neurologic    Sudden weakness in arms or legs:     Sudden numbness in arms or legs:     Sudden onset of difficulty speaking or understanding others    Temporary loss of vision in one eye:     Problems with dizziness:         Gastrointestinal    Blood in stool:     Vomited blood:         Genitourinary    Burning when urinating:     Blood in urine:        Psychiatric    Major depression:         Hematologic    Bleeding problems:    Problems with blood clotting too easily:        Skin    Rashes or ulcers:        Constitutional    Fever or chills:      PHYSICAL EXAMINATION:  ***  General:  WDWN in NAD; vital signs documented above Gait: Not observed HENT: WNL, normocephalic Pulmonary: normal non-labored breathing  Cardiac: {Desc; regular/irreg:14544} HR;  {With/Without:20273} carotid bruit*** Abdomen: soft, NT, aortic pulse is *** palpable Skin: {With/Without:20273} rashes Vascular Exam/Pulses:  Right Left  Radial {Exam; arterial pulse strength 0-4:30167} {Exam; arterial pulse strength 0-4:30167}  Femoral {Exam; arterial pulse strength 0-4:30167} {Exam; arterial pulse strength 0-4:30167}  Popliteal {Exam; arterial pulse strength 0-4:30167} {Exam; arterial pulse strength 0-4:30167}  DP {Exam; arterial pulse strength 0-4:30167} {Exam; arterial pulse strength 0-4:30167}  PT {Exam; arterial pulse strength 0-4:30167} {Exam; arterial pulse strength 0-4:30167}  Peroneal *** ***   Extremities: *** Musculoskeletal: no muscle wasting or atrophy  Neurologic: A&O X 3;  speech is fluent/normal; moving all extremities equally *** Psychiatric:  The pt has {Desc;  normal/abnormal:11317::"Normal"} affect.   Non-Invasive Vascular Imaging:   ABI's/TBI's on 09/11/2022: Right:  *** - great toe pressure:  *** Left:  *** - great toe pressure:  ***   Arterial duplex on 09/11/2022: ***  Non-Invasive Vascular Imaging:   Carotid Duplex on 09/11/2022: Right:  ***% ICA stenosis Left:  ***% ICA stenosis  Previous ABI's/TBI's on ***: Right:  *** - great toe pressure:  *** Left:  *** - great toe pressure:  ***  Previous Carotid duplex on 06/13/2021: Right: 1-39% ICA stenosis Left:   1-39% ICA stenosis Vertebrals:  Bilateral vertebral arteries demonstrate antegrade flow.  Subclavians: Normal  flow hemodynamics were seen in the right subclavian artery.  Right carotid to subclavian bypass is patent.     ASSESSMENT/PLAN:: 56 y.o. male here for follow up for PAD and carotid stenosis with hx of ***   PAD -*** -pt does *** have rest pain, ***claudication, ***non healing wounds. -continue graduated walking program -pt will f/u in *** with ***  Carotid stenosis -duplex today reveals *** -discussed s/s of stroke with pt and ***he understands should ***he develop any of these sx, ***he will go to the nearest ER or call 911. -pt will f/u in *** with ***  -continue statin/asa ***   Leontine Locket, [facility] MD:   Trula Slade

## 2022-09-11 ENCOUNTER — Ambulatory Visit (HOSPITAL_COMMUNITY): Payer: Medicaid Other

## 2022-09-11 ENCOUNTER — Ambulatory Visit: Payer: Medicaid Other

## 2022-09-11 ENCOUNTER — Ambulatory Visit (HOSPITAL_COMMUNITY): Payer: Medicaid Other | Attending: Surgery

## 2022-09-28 ENCOUNTER — Other Ambulatory Visit (HOSPITAL_COMMUNITY): Payer: Self-pay | Admitting: Adult Health

## 2022-10-18 ENCOUNTER — Encounter (HOSPITAL_COMMUNITY): Payer: Medicaid Other

## 2022-10-20 ENCOUNTER — Other Ambulatory Visit (HOSPITAL_COMMUNITY): Payer: Medicaid Other

## 2022-10-20 ENCOUNTER — Ambulatory Visit: Payer: Medicaid Other

## 2022-10-20 ENCOUNTER — Encounter (HOSPITAL_COMMUNITY): Payer: Medicaid Other

## 2022-10-31 ENCOUNTER — Other Ambulatory Visit (HOSPITAL_COMMUNITY): Payer: Self-pay | Admitting: Adult Health

## 2022-11-05 NOTE — Progress Notes (Unsigned)
Established Previous Bypass   History of Present Illness   Ryan [name] is a 56 y.o. (07/08/1966) male who presents with chief complaint: ***.    Prior procedures include:  ***   The patient's symptoms have *** progressed.  The patient's symptoms are: ***. The patient's treatment regimen currently included: maximal medical management ***and ***.  The patient's PMH, PSH, SH, and FamHx were reviewed on *** are unchanged from ***.  Current Outpatient Medications  Medication Sig Dispense Refill   aspirin EC 81 MG tablet Take 1 tablet (81 mg total) by mouth daily. 90 tablet 3   carvedilol (COREG) 25 MG tablet Take 1 tablet (25 mg total) by mouth 2 (two) times daily. 180 tablet 3   cetirizine (ZYRTEC) 10 MG tablet Take 10 mg by mouth daily.     clopidogrel (PLAVIX) 75 MG tablet Take 75 mg by mouth daily.     cyclobenzaprine (FLEXERIL) 10 MG tablet Take 1 tablet (10 mg total) by mouth 2 (two) times daily as needed for muscle spasms. 30 tablet 0   diazepam (VALIUM) 5 MG tablet Take 5 mg by mouth daily as needed for anxiety.     ELIQUIS 5 MG TABS tablet TAKE 1 TABLET BY MOUTH 2 TIMES A DAY 180 tablet 1   ENTRESTO 24-26 MG TAKE 1 TABLET BY MOUTH TWICE DAILY 60 tablet 0   FARXIGA 10 MG TABS tablet Take 10 mg by mouth daily.     furosemide (LASIX) 40 MG tablet TAKE 1 TABLET BY MOUTH EACH DAY 90 tablet 3   gabapentin (NEURONTIN) 300 MG capsule Take 300 mg by mouth 3 (three) times daily.     insulin glargine (LANTUS) 100 UNIT/ML Solostar Pen Inject 50 Units into the skin 2 (two) times daily.     insulin lispro (HUMALOG) 100 UNIT/ML injection Inject 2-15 Units into the skin 3 (three) times daily before meals. sliding scale .     naloxone (NARCAN) nasal spray 4 mg/0.1 mL Place 4 mg into the nose once as needed (overdose).     nitroGLYCERIN (NITROSTAT) 0.4 MG SL tablet Place 0.4 mg under the tongue every 5 (five) minutes as needed for chest pain.     oxyCODONE-acetaminophen (PERCOCET) 10-325 MG  tablet Take 1 tablet by mouth 4 (four) times daily.     pantoprazole (PROTONIX) 40 MG tablet Take 40 mg by mouth daily.     rosuvastatin (CRESTOR) 20 MG tablet Take 2 tablets (40 mg total) by mouth daily. 180 tablet 3   No current facility-administered medications for this visit.    ***REVIEW OF SYSTEMS (negative unless checked):   Cardiac:  []  Chest pain or chest pressure? []  Shortness of breath upon activity? []  Shortness of breath when lying flat? []  Irregular heart rhythm?  Vascular:  []  Pain in calf, thigh, or hip brought on by walking? []  Pain in feet at night that wakes you up from your sleep? []  Blood clot in your veins? []  Leg swelling?  Pulmonary:  []  Oxygen at home? []  Productive cough? []  Wheezing?  Neurologic:  []  Sudden weakness in arms or legs? []  Sudden numbness in arms or legs? []  Sudden onset of difficult speaking or slurred speech? []  Temporary loss of vision in one eye? []  Problems with dizziness?  Gastrointestinal:  []  Blood in stool? []  Vomited blood?  Genitourinary:  []  Burning when urinating? []  Blood in urine?  Psychiatric:  []  Major depression  Hematologic:  []  Bleeding problems? []  Problems with blood clotting?  Dermatologic:  []  Rashes or ulcers?  Constitutional:  []  Fever or chills?  Ear/Nose/Throat:  []  Change in hearing? []  Nose bleeds? []  Sore throat?  Musculoskeletal:  []  Back pain? []  Joint pain? []  Muscle pain?   Physical Examination  ***There were no vitals filed for this visit. ***There is no height or weight on file to calculate BMI.  General:  WDWN in NAD; vital signs documented above Gait: Not observed HENT: WNL, normocephalic Pulmonary: normal non-labored breathing , without Rales, rhonchi,  wheezing Cardiac: {Desc; regular/irreg:14544} HR, without  Murmurs {With/Without:20273} carotid bruit*** Abdomen: soft, NT, no masses Skin: {With/Without:20273} rashes Vascular Exam/Pulses:  Right Left  Radial  {Exam; arterial pulse strength 0-4:30167} {Exam; arterial pulse strength 0-4:30167}  Ulnar {Exam; arterial pulse strength 0-4:30167} {Exam; arterial pulse strength 0-4:30167}  Femoral {Exam; arterial pulse strength 0-4:30167} {Exam; arterial pulse strength 0-4:30167}  Popliteal {Exam; arterial pulse strength 0-4:30167} {Exam; arterial pulse strength 0-4:30167}  DP {Exam; arterial pulse strength 0-4:30167} {Exam; arterial pulse strength 0-4:30167}  PT {Exam; arterial pulse strength 0-4:30167} {Exam; arterial pulse strength 0-4:30167}   Extremities: {With/Without:20273} ischemic changes, {With/Without:20273} Gangrene , {With/Without:20273} cellulitis; {With/Without:20273} open wounds;  Musculoskeletal: no muscle wasting or atrophy  Neurologic: A&O X 3;  No focal weakness or paresthesias are detected Psychiatric:  The pt has {Desc; normal/abnormal:11317::"Normal"} affect.  Non-Invasive Vascular Imaging ABI (11/06/2022)   RLE Bypass Graft Duplex (11/06/2022)   Carotid Duplex (11/06/2022)    Medical Decision Making   Ryan [name] is a 56 y.o. male who presents with: *** s/p *** .  Based on the patient's vascular studies and examination, I have offered the patient: ***BLE ABI, ***bypass duplex, and ***aortoiliac duplex in *** months. Last studies on 06/13/2021 demonstrated 1 to 39% bilateral carotid artery stenosis.  Right ABI was 0.91 with monophasic PT/DP flow. Left ABI was 1.03 with biphasic flow.  Right femoropopliteal bypass graft with monophasic inflow and outflow and 70% stenosis at the proximal anastomosis. I discussed in depth with the patient the nature of atherosclerosis, and emphasized the importance of maximal medical management including strict control of blood pressure, blood glucose, and lipid levels, obtaining regular exercise, and cessation of smoking.   The patient is aware that without maximal medical management the underlying atherosclerotic disease process will progress,  limiting the benefit of any interventions. I discussed in depth with the patient the need for long term surveillance to improve the primary assisted patency of his bypass.  The patient agrees to cooperate with such. The patient is currently {Statin:19197::"not on on statin as not medically indicated.","not on a statin due to reported allergy.","not on a statin. Patient will be started on Lipitor 10 mg PO daily, to be titrated and managed by their primary physician.","on a statin: Mevacor.","on a statin: Zocor.","on a statin: Lipitor.","on a statin: Pravachol.","on a statin: Crestor.","on a statin: ***."}  The patient is currently {Antiplatelet:19197::"not on an anti-platelet due to allergy.","not on an anti-platelet due to bleeding risks related to use of an anticoagulant.","not on an anti-platelet. Patient will be started on ASA 81 mg PO daily","on an anti-platelet: ASA.","on an anti-platelet: Plavix.","on an anti-platelet: ASA and Plavix.","on an anti-platelet: ***."} Thank you for allowing [place] to participate in this patient's care.   Dagoberto Ligas PA-C [facility] [place] Office: [phone]  Clinic MD: ***  taking plavix anymore?  R CFA stenosis tx with balloon angioplasty on 06/28/2021. Widely patent R fem-pop bypass and L SFA stents. Have not seen since then.

## 2022-11-06 ENCOUNTER — Ambulatory Visit: Payer: Medicaid Other

## 2022-11-06 ENCOUNTER — Ambulatory Visit (INDEPENDENT_AMBULATORY_CARE_PROVIDER_SITE_OTHER)
Admission: RE | Admit: 2022-11-06 | Discharge: 2022-11-06 | Disposition: A | Discharge status: Home / Self Care | Payer: Medicaid Other | Source: Ambulatory Visit | Attending: Surgery | Admitting: Surgery

## 2022-11-06 ENCOUNTER — Ambulatory Visit (HOSPITAL_COMMUNITY)
Admission: RE | Admit: 2022-11-06 | Discharge: 2022-11-06 | Disposition: A | Discharge status: Home / Self Care | Payer: Medicaid Other | Source: Ambulatory Visit | Attending: Surgery | Admitting: Surgery

## 2022-11-06 DIAGNOSIS — I739 Peripheral vascular disease, unspecified: Secondary | ICD-10-CM | POA: Insufficient documentation

## 2022-11-06 DIAGNOSIS — G458 Other transient cerebral ischemic attacks and related syndromes: Secondary | ICD-10-CM | POA: Diagnosis present

## 2022-11-08 ENCOUNTER — Encounter (HOSPITAL_COMMUNITY): Payer: Medicaid Other

## 2022-11-29 ENCOUNTER — Other Ambulatory Visit (HOSPITAL_COMMUNITY): Payer: Self-pay | Admitting: Adult Health

## 2022-12-11 ENCOUNTER — Other Ambulatory Visit: Payer: Self-pay

## 2022-12-11 ENCOUNTER — Ambulatory Visit: Payer: Medicaid Other | Admitting: Physician Assistant

## 2022-12-11 VITALS — BP 113/79 | HR 99 | Temp 97.7°F | Resp 18 | Ht 69.0 in | Wt 222.0 lb

## 2022-12-11 DIAGNOSIS — I739 Peripheral vascular disease, unspecified: Secondary | ICD-10-CM

## 2022-12-11 DIAGNOSIS — G458 Other transient cerebral ischemic attacks and related syndromes: Secondary | ICD-10-CM

## 2022-12-11 NOTE — H&P (View-Only) (Signed)
Office Note   History of Present Illness   Ryan [name] is a 57 y.o. (1966-09-07) male who presents for surveillance of PAD and carotid artery stenosis.  He has a history of aortobifemoral bypass in 2014 and left carotid subclavian bypass in 2014.  He also underwent right femoral to above-knee popliteal artery bypass with ipsilateral nonreversed greater saphenous vein on 03/13/2018 by Dr. Myra Gianotti. In 2019 he also underwent stenting of the left SFA. He developed worsening right lower extremity claudication in 2022 and required DCBA of the right common femoral artery. We have not seen the patient since his procedure in 2022.  At follow-up today, the patient states that he is started developing worsening bilateral lower extremity claudication for the past 2 months.  He states that he was started developing claudication in both calves starting at 100 feet.  This is now progressed to severe claudication starting at 50 feet.  He states that the claudication is worse in the left leg than the right.  He denies any rest pain or new wounds.  He denies any stroke or TIA-like symptoms.  He denies any claudication in the left arm.  He is still taking a statin, aspirin, and Eliquis 5 mg twice daily.    Current Outpatient Medications  Medication Sig Dispense Refill   aspirin EC 81 MG tablet Take 1 tablet (81 mg total) by mouth daily. 90 tablet 3   carvedilol (COREG) 25 MG tablet Take 1 tablet (25 mg total) by mouth 2 (two) times daily. 180 tablet 3   cetirizine (ZYRTEC) 10 MG tablet Take 10 mg by mouth daily.     clopidogrel (PLAVIX) 75 MG tablet Take 75 mg by mouth daily.     cyclobenzaprine (FLEXERIL) 10 MG tablet Take 1 tablet (10 mg total) by mouth 2 (two) times daily as needed for muscle spasms. 30 tablet 0   diazepam (VALIUM) 5 MG tablet Take 5 mg by mouth daily as needed for anxiety.     ELIQUIS 5 MG TABS tablet TAKE 1 TABLET BY MOUTH 2 TIMES A DAY 180 tablet 1   ENTRESTO 24-26 MG TAKE 1 TABLET BY  MOUTH TWICE DAILY 60 tablet 0   FARXIGA 10 MG TABS tablet Take 10 mg by mouth daily.     furosemide (LASIX) 40 MG tablet TAKE 1 TABLET BY MOUTH EACH DAY 90 tablet 3   gabapentin (NEURONTIN) 300 MG capsule Take 300 mg by mouth 3 (three) times daily.     insulin glargine (LANTUS) 100 UNIT/ML Solostar Pen Inject 50 Units into the skin 2 (two) times daily.     insulin lispro (HUMALOG) 100 UNIT/ML injection Inject 2-15 Units into the skin 3 (three) times daily before meals. sliding scale .     naloxone (NARCAN) nasal spray 4 mg/0.1 mL Place 4 mg into the nose once as needed (overdose).     nitroGLYCERIN (NITROSTAT) 0.4 MG SL tablet Place 0.4 mg under the tongue every 5 (five) minutes as needed for chest pain.     oxyCODONE-acetaminophen (PERCOCET) 10-325 MG tablet Take 1 tablet by mouth 4 (four) times daily.     pantoprazole (PROTONIX) 40 MG tablet Take 40 mg by mouth daily.     rosuvastatin (CRESTOR) 20 MG tablet Take 2 tablets (40 mg total) by mouth daily. 180 tablet 3   No current facility-administered medications for this visit.    REVIEW OF SYSTEMS (negative unless checked):   Cardiac:  []  Chest pain or chest pressure? []  Shortness of  breath upon activity? []  Shortness of breath when lying flat? []  Irregular heart rhythm?  Vascular:  [x]  Pain in calf, thigh, or hip brought on by walking? []  Pain in feet at night that wakes you up from your sleep? []  Blood clot in your veins? []  Leg swelling?  Pulmonary:  []  Oxygen at home? []  Productive cough? []  Wheezing?  Neurologic:  []  Sudden weakness in arms or legs? []  Sudden numbness in arms or legs? []  Sudden onset of difficult speaking or slurred speech? []  Temporary loss of vision in one eye? []  Problems with dizziness?  Gastrointestinal:  []  Blood in stool? []  Vomited blood?  Genitourinary:  []  Burning when urinating? []  Blood in urine?  Psychiatric:  []  Major depression  Hematologic:  []  Bleeding problems? []  Problems  with blood clotting?  Dermatologic:  []  Rashes or ulcers?  Constitutional:  []  Fever or chills?  Ear/Nose/Throat:  []  Change in hearing? []  Nose bleeds? []  Sore throat?  Musculoskeletal:  []  Back pain? []  Joint pain? []  Muscle pain?   Physical Examination   Vitals:   12/11/22 1137 12/11/22 1138  BP: 112/78 113/79  Pulse: 99 99  Resp: 18   Temp: 97.7 F (36.5 C)   TempSrc: Temporal   SpO2: 97%   Weight: 222 lb (100.7 kg)   Height: 5\' 9"  (1.753 m)    Body mass index is 32.78 kg/m.  General:  WDWN in NAD; vital signs documented above Gait: Not observed HENT: WNL, normocephalic Pulmonary: normal non-labored breathing  Cardiac: regular HR, without carotid bruit Abdomen: soft, NT, no masses Skin: without rashes Vascular Exam/Pulses: Palpable radial pulses 2+.  Monophasic left PT/DP Doppler signal.  Monophasic right DP and triphasic right PT Doppler signal Extremities: without ischemic changes, without Gangrene , without cellulitis; without open wounds;  Musculoskeletal: no muscle wasting or atrophy  Neurologic: A&O X 3;  No focal weakness or paresthesias are detected Psychiatric:  The pt has Normal affect.  Non-Invasive Vascular Imaging ABI (11/06/2022) +-------+-----------+-----------+------------+------------+  ABI/TBIToday's ABIToday's TBIPrevious ABIPrevious TBI  +-------+-----------+-----------+------------+------------+  Right 1.13       0.67       0.91        0.76          +-------+-----------+-----------+------------+------------+  Left  1.00       0.69       1.03        1.00          +-------+-----------+-----------+------------+------------+   RLE BPG Duplex (11/06/2022) There is monophasic inflow to the right femoropopliteal bypass graft.  There is a focal area of stenosis at the proximal anastomosis with PSV of 211.  There is biphasic outflow.   BLE Carotid Duplex (11/06/2022) Bilateral carotid artery stenosis 1 to 39%.  Patent left  subclavian carotid artery bypass graft.  Elevated velocities of 251 and 232 at the proximal and distal portions of the subclavian carotid bypass.   Medical Decision Making   Ryan [name] is a 58 y.o. male who presents for surveillance of PAD and carotid artery stenosis.  Based on the patient's vascular studies, his ABIs bilaterally are essentially unchanged.  His right ABI is 1.13 and was 0.91.  His left ABI is 1 and was 1.03.  His TBI's are decreased bilaterally. Duplex of the right femoropopliteal bypass graft demonstrates a patent bypass graft with a focal area of stenosis at the proximal anastomosis with PSV of 211.  The patient's left SFA stent was not studied today. Bilateral carotid artery duplex  study demonstrates stable stenosis bilaterally of 1 to 39%.  The left subclavian carotid artery bypass graft is also patent.  There are elevated velocities at the proximal and distal portions of the bypass, however the patient is asymptomatic and has a palpable radial pulse. He has had worsening bilateral lower extremity claudication at 50 feet.  The symptoms have been worsening over 2 months.  The patient will be scheduled  in 2-3 wks for an aortogram with bilateral lower extremity runoff and possible intervention on the left SFA stent and right femoropopliteal bypass graft.  Access will need to be obtained through the groin.  His left carotid subclavian bypass can be reimaged in 1 yr.   Loel Dubonnet PA-C [facility] Office: [phone]  Clinic MD: Myra Gianotti

## 2022-12-11 NOTE — Progress Notes (Signed)
Office Note   History of Present Illness   Ryan [name] is a 57 y.o. (1966-09-07) male who presents for surveillance of PAD and carotid artery stenosis.  He has a history of aortobifemoral bypass in 2014 and left carotid subclavian bypass in 2014.  He also underwent right femoral to above-knee popliteal artery bypass with ipsilateral nonreversed greater saphenous vein on 03/13/2018 by Dr. Myra Gianotti. In 2019 he also underwent stenting of the left SFA. He developed worsening right lower extremity claudication in 2022 and required DCBA of the right common femoral artery. We have not seen the patient since his procedure in 2022.  At follow-up today, the patient states that he is started developing worsening bilateral lower extremity claudication for the past 2 months.  He states that he was started developing claudication in both calves starting at 100 feet.  This is now progressed to severe claudication starting at 50 feet.  He states that the claudication is worse in the left leg than the right.  He denies any rest pain or new wounds.  He denies any stroke or TIA-like symptoms.  He denies any claudication in the left arm.  He is still taking a statin, aspirin, and Eliquis 5 mg twice daily.    Current Outpatient Medications  Medication Sig Dispense Refill   aspirin EC 81 MG tablet Take 1 tablet (81 mg total) by mouth daily. 90 tablet 3   carvedilol (COREG) 25 MG tablet Take 1 tablet (25 mg total) by mouth 2 (two) times daily. 180 tablet 3   cetirizine (ZYRTEC) 10 MG tablet Take 10 mg by mouth daily.     clopidogrel (PLAVIX) 75 MG tablet Take 75 mg by mouth daily.     cyclobenzaprine (FLEXERIL) 10 MG tablet Take 1 tablet (10 mg total) by mouth 2 (two) times daily as needed for muscle spasms. 30 tablet 0   diazepam (VALIUM) 5 MG tablet Take 5 mg by mouth daily as needed for anxiety.     ELIQUIS 5 MG TABS tablet TAKE 1 TABLET BY MOUTH 2 TIMES A DAY 180 tablet 1   ENTRESTO 24-26 MG TAKE 1 TABLET BY  MOUTH TWICE DAILY 60 tablet 0   FARXIGA 10 MG TABS tablet Take 10 mg by mouth daily.     furosemide (LASIX) 40 MG tablet TAKE 1 TABLET BY MOUTH EACH DAY 90 tablet 3   gabapentin (NEURONTIN) 300 MG capsule Take 300 mg by mouth 3 (three) times daily.     insulin glargine (LANTUS) 100 UNIT/ML Solostar Pen Inject 50 Units into the skin 2 (two) times daily.     insulin lispro (HUMALOG) 100 UNIT/ML injection Inject 2-15 Units into the skin 3 (three) times daily before meals. sliding scale .     naloxone (NARCAN) nasal spray 4 mg/0.1 mL Place 4 mg into the nose once as needed (overdose).     nitroGLYCERIN (NITROSTAT) 0.4 MG SL tablet Place 0.4 mg under the tongue every 5 (five) minutes as needed for chest pain.     oxyCODONE-acetaminophen (PERCOCET) 10-325 MG tablet Take 1 tablet by mouth 4 (four) times daily.     pantoprazole (PROTONIX) 40 MG tablet Take 40 mg by mouth daily.     rosuvastatin (CRESTOR) 20 MG tablet Take 2 tablets (40 mg total) by mouth daily. 180 tablet 3   No current facility-administered medications for this visit.    REVIEW OF SYSTEMS (negative unless checked):   Cardiac:  []  Chest pain or chest pressure? []  Shortness of  breath upon activity? []  Shortness of breath when lying flat? []  Irregular heart rhythm?  Vascular:  [x]  Pain in calf, thigh, or hip brought on by walking? []  Pain in feet at night that wakes you up from your sleep? []  Blood clot in your veins? []  Leg swelling?  Pulmonary:  []  Oxygen at home? []  Productive cough? []  Wheezing?  Neurologic:  []  Sudden weakness in arms or legs? []  Sudden numbness in arms or legs? []  Sudden onset of difficult speaking or slurred speech? []  Temporary loss of vision in one eye? []  Problems with dizziness?  Gastrointestinal:  []  Blood in stool? []  Vomited blood?  Genitourinary:  []  Burning when urinating? []  Blood in urine?  Psychiatric:  []  Major depression  Hematologic:  []  Bleeding problems? []  Problems  with blood clotting?  Dermatologic:  []  Rashes or ulcers?  Constitutional:  []  Fever or chills?  Ear/Nose/Throat:  []  Change in hearing? []  Nose bleeds? []  Sore throat?  Musculoskeletal:  []  Back pain? []  Joint pain? []  Muscle pain?   Physical Examination   Vitals:   12/11/22 1137 12/11/22 1138  BP: 112/78 113/79  Pulse: 99 99  Resp: 18   Temp: 97.7 F (36.5 C)   TempSrc: Temporal   SpO2: 97%   Weight: 222 lb (100.7 kg)   Height: 5\' 9"  (1.753 m)    Body mass index is 32.78 kg/m.  General:  WDWN in NAD; vital signs documented above Gait: Not observed HENT: WNL, normocephalic Pulmonary: normal non-labored breathing  Cardiac: regular HR, without carotid bruit Abdomen: soft, NT, no masses Skin: without rashes Vascular Exam/Pulses: Palpable radial pulses 2+.  Monophasic left PT/DP Doppler signal.  Monophasic right DP and triphasic right PT Doppler signal Extremities: without ischemic changes, without Gangrene , without cellulitis; without open wounds;  Musculoskeletal: no muscle wasting or atrophy  Neurologic: A&O X 3;  No focal weakness or paresthesias are detected Psychiatric:  The pt has Normal affect.  Non-Invasive Vascular Imaging ABI (11/06/2022) +-------+-----------+-----------+------------+------------+  ABI/TBIToday's ABIToday's TBIPrevious ABIPrevious TBI  +-------+-----------+-----------+------------+------------+  Right 1.13       0.67       0.91        0.76          +-------+-----------+-----------+------------+------------+  Left  1.00       0.69       1.03        1.00          +-------+-----------+-----------+------------+------------+   RLE BPG Duplex (11/06/2022) There is monophasic inflow to the right femoropopliteal bypass graft.  There is a focal area of stenosis at the proximal anastomosis with PSV of 211.  There is biphasic outflow.   BLE Carotid Duplex (11/06/2022) Bilateral carotid artery stenosis 1 to 39%.  Patent left  subclavian carotid artery bypass graft.  Elevated velocities of 251 and 232 at the proximal and distal portions of the subclavian carotid bypass.   Medical Decision Making   Ryan [name] is a 58 y.o. male who presents for surveillance of PAD and carotid artery stenosis.  Based on the patient's vascular studies, his ABIs bilaterally are essentially unchanged.  His right ABI is 1.13 and was 0.91.  His left ABI is 1 and was 1.03.  His TBI's are decreased bilaterally. Duplex of the right femoropopliteal bypass graft demonstrates a patent bypass graft with a focal area of stenosis at the proximal anastomosis with PSV of 211.  The patient's left SFA stent was not studied today. Bilateral carotid artery duplex  study demonstrates stable stenosis bilaterally of 1 to 39%.  The left subclavian carotid artery bypass graft is also patent.  There are elevated velocities at the proximal and distal portions of the bypass, however the patient is asymptomatic and has a palpable radial pulse. He has had worsening bilateral lower extremity claudication at 50 feet.  The symptoms have been worsening over 2 months.  The patient will be scheduled  in 2-3 wks for an aortogram with bilateral lower extremity runoff and possible intervention on the left SFA stent and right femoropopliteal bypass graft.  Access will need to be obtained through the groin.  His left carotid subclavian bypass can be reimaged in 1 yr.   Ryan Dubonnet PA-C [facility] Office: [phone]  Clinic MD: Myra Gianotti

## 2022-12-13 ENCOUNTER — Encounter (HOSPITAL_COMMUNITY): Payer: Medicaid Other

## 2022-12-19 ENCOUNTER — Telehealth (HOSPITAL_COMMUNITY): Payer: Self-pay

## 2022-12-19 NOTE — Telephone Encounter (Signed)
Called to confirm/remind patient of their appointment at the [facility] on 12/20/22 @ 9:00am.   Patient reminded to bring all medications and/or complete list.  Confirmed patient has transportation. Gave directions, instructed to utilize [facility] parking.  Confirmed appointment prior to ending call.

## 2022-12-20 ENCOUNTER — Ambulatory Visit (HOSPITAL_COMMUNITY)
Admission: RE | Admit: 2022-12-20 | Discharge: 2022-12-20 | Disposition: A | Discharge status: Home / Self Care | Payer: Medicaid Other | Source: Ambulatory Visit | Attending: Adult Health | Admitting: Adult Health

## 2022-12-20 VITALS — BP 100/60 | HR 70 | Wt 230.0 lb

## 2022-12-20 DIAGNOSIS — E1122 Type 2 diabetes mellitus with diabetic chronic kidney disease: Secondary | ICD-10-CM | POA: Diagnosis not present

## 2022-12-20 DIAGNOSIS — I25810 Atherosclerosis of coronary artery bypass graft(s) without angina pectoris: Secondary | ICD-10-CM | POA: Diagnosis not present

## 2022-12-20 DIAGNOSIS — Z7901 Long term (current) use of anticoagulants: Secondary | ICD-10-CM | POA: Insufficient documentation

## 2022-12-20 DIAGNOSIS — I252 Old myocardial infarction: Secondary | ICD-10-CM | POA: Insufficient documentation

## 2022-12-20 DIAGNOSIS — N1831 Chronic kidney disease, stage 3a: Secondary | ICD-10-CM | POA: Insufficient documentation

## 2022-12-20 DIAGNOSIS — E669 Obesity, unspecified: Secondary | ICD-10-CM | POA: Diagnosis not present

## 2022-12-20 DIAGNOSIS — I2510 Atherosclerotic heart disease of native coronary artery without angina pectoris: Secondary | ICD-10-CM | POA: Insufficient documentation

## 2022-12-20 DIAGNOSIS — I739 Peripheral vascular disease, unspecified: Secondary | ICD-10-CM | POA: Diagnosis not present

## 2022-12-20 DIAGNOSIS — F17210 Nicotine dependence, cigarettes, uncomplicated: Secondary | ICD-10-CM | POA: Insufficient documentation

## 2022-12-20 DIAGNOSIS — Z79899 Other long term (current) drug therapy: Secondary | ICD-10-CM | POA: Diagnosis not present

## 2022-12-20 DIAGNOSIS — I130 Hypertensive heart and chronic kidney disease with heart failure and stage 1 through stage 4 chronic kidney disease, or unspecified chronic kidney disease: Secondary | ICD-10-CM | POA: Insufficient documentation

## 2022-12-20 DIAGNOSIS — I5022 Chronic systolic (congestive) heart failure: Secondary | ICD-10-CM | POA: Insufficient documentation

## 2022-12-20 DIAGNOSIS — I480 Paroxysmal atrial fibrillation: Secondary | ICD-10-CM | POA: Insufficient documentation

## 2022-12-20 DIAGNOSIS — E1151 Type 2 diabetes mellitus with diabetic peripheral angiopathy without gangrene: Secondary | ICD-10-CM | POA: Insufficient documentation

## 2022-12-20 LAB — BASIC METABOLIC PANEL
Anion gap: 10 (ref 5–15)
BUN: 13 mg/dL (ref 6–20)
CO2: 22 mmol/L (ref 22–32)
Calcium: 8.2 mg/dL — ABNORMAL LOW (ref 8.9–10.3)
Chloride: 100 mmol/L (ref 98–111)
Creatinine, Ser: 1.14 mg/dL (ref 0.61–1.24)
GFR, Estimated: >60 mL/min (ref >60)
Glucose, Bld: 388 mg/dL — ABNORMAL HIGH (ref 70–99)
Potassium: 4 mmol/L (ref 3.5–5.1)
Sodium: 132 mmol/L — ABNORMAL LOW (ref 135–145)

## 2022-12-20 LAB — LIPID PANEL
Cholesterol: 164 mg/dL (ref 0–200)
HDL: 25 mg/dL — ABNORMAL LOW (ref >40)
LDL Cholesterol: 98 mg/dL (ref 0–99)
Total CHOL/HDL Ratio: 6.6 RATIO
Triglycerides: 207 mg/dL — ABNORMAL HIGH (ref <150)
VLDL: 41 mg/dL — ABNORMAL HIGH (ref 0–40)

## 2022-12-20 LAB — CBC
HCT: 43.9 % (ref 39.0–52.0)
Hemoglobin: 15 g/dL (ref 13.0–17.0)
MCH: 32.3 pg (ref 26.0–34.0)
MCHC: 34.2 g/dL (ref 30.0–36.0)
MCV: 94.6 fL (ref 80.0–100.0)
Platelets: 187 10*3/uL (ref 150–400)
RBC: 4.64 MIL/uL (ref 4.22–5.81)
RDW: 14.1 % (ref 11.5–15.5)
WBC: 8.9 10*3/uL (ref 4.0–10.5)
nRBC: 0 % (ref 0.0–0.2)

## 2022-12-20 NOTE — Patient Instructions (Addendum)
Thank you for coming in today  EKG today  Labs were done today, if any labs are abnormal the clinic will call you No news is good news  PLEASE FOLLOW UP WITH DR. Camillia Herter OFFICE [phone]  Your physician recommends that you schedule a follow-up appointment in:  Follow up as needed     Do the following things EVERYDAY: Weigh yourself in the morning before breakfast. Write it down and keep it in a log. Take your medicines as prescribed Eat low salt foods--Limit salt (sodium) to 2000 mg per day.  Stay as active as you can everyday Limit all fluids for the day to less than 2 liters   At the [facility], you and your health needs are our priority. As part of our continuing mission to provide you with exceptional heart care, we have created designated Provider Care Teams. These Care Teams include your primary Cardiologist (physician) and Advanced Practice Providers (APPs- Physician Assistants and Nurse Practitioners) who all work together to provide you with the care you need, when you need it.   You may see any of the following providers on your designated Care Team at your next follow up: Dr Glori Bickers Dr Loralie Champagne Dr. Roxana Hires, NP Lyda Jester, [place] [facility] [place], [place] Forestine Na, NP Audry Riles, PharmD   Please be sure to bring in all your medications bottles to every appointment.   If you have any questions or concerns before your next appointment please send [place] a message through [place] or call our office at [phone].    TO LEAVE A MESSAGE FOR THE NURSE SELECT OPTION 2, PLEASE LEAVE A MESSAGE INCLUDING: YOUR NAME DATE OF BIRTH CALL BACK NUMBER REASON FOR CALL**this is important as we prioritize the call backs  YOU WILL RECEIVE A CALL BACK THE SAME DAY AS LONG AS YOU CALL BEFORE 4:00 PM

## 2022-12-20 NOTE — Addendum Note (Signed)
Encounter addended by: Payton Mccallum, RN on: 12/20/2022 2:07 PM  Actions taken: Clinical Note Signed

## 2022-12-20 NOTE — Progress Notes (Signed)
PCP: Dr Rich Reining Primary Cardiologist:Dr Angelena Form   HPI:  Mr Waddington is a 57 year old with PAD, CAD, PAF, tobacco abuse, and chronic HFrEF. He underwent 3V CABG 05/25/20. (LIMA to LAD, SVG to Diagonal and SVG to RCA). His post-operative course was complicated by atrial fibrillation and he was started on amiodarone. Echo with LVEF->30-35% with apical thrombus. He was discharged on Eliquis. Echo post bypass improved LVEF->40-45%.    On 02/22/22 saw Dr. Ali Lowe and reported chest pain. Imdur was started. He was then admitted  to [facility] on 03/20/22 with chest pain and NSTEMI. Cardiac cath at [facility] April 2023 with severe disease in the SVG to RCA treated with a drug eluting stent and clot embolization to distal PDA.  Says he was short of breath when he being discharged.    Admitted 03/29/2022 to [facility] with increased shortness of breath.  He had episode of presyncope when standing. Concerned he was orthostatic  due to imdur. Imdur was stopped and the symptoms resolved. Diuresed with IV lasix.  Discharged on 03/31/22.  Weigtht at that time was 232 pounds.     Saw Dr Angelena Form on 04/03/22. Had zio patch placed.   He was seen in the HF [facility] clinic in May 2023. He was set up follow in the HF clinic. He no showed or canceled the 5 appointments.   Had an Echo 07/2022 EF 45-50%    Today he returns for HF follow up.Overall feeling fine. Denies SOB/PND/Orthopnea. Denies dizziness. Limited by lower extremity pain. Pain in his legs go away away after he rests.  He is active at home. Has wood burning heater and carries wood daily.  Appetite ok. No fever or chills. Weight at home stable.  Smoking 1/2 pack per day. Taking all medications. Lives with his girlfriend. Disabled since 2013. He does not drive.    Labs 03/31/22 K 3.7  Labs 03/30/22 K 3.7 Creatinine 1.45    Cardiac Testing  Echo 03/2022 EF 30-35% RV moderately reduced grade III DD. AK LV,  Echo 09/2020 EF 50-55% LV  thrombus Echo 05/2020  EF 30-35%    LHC 03/2022  -  severe disease in the SVG to RCA treated with a drug eluting stent.   [place] 2021 Ost 3rd Mrg to 3rd Mrg lesion is 100% stenosed. Mid RCA lesion is 60% stenosed. 3rd Mrg lesion is 100% stenosed. Prox LAD to Mid LAD lesion is 80% stenosed. 1st Diag lesion is 80% stenosed. The left ventricular ejection fraction is 25-35% by visual estimate. There is moderate left ventricular systolic dysfunction. There is no mitral valve regurgitation.  1. Severe restenosis mid LAD stented segment 2. Severe stenosis ostium of the Diagonal branch, jailed by the stent 3. Chronic occlusion of the third OM branch, filling from collaterals 4. Moderate mid RCA stenosis.  5. Moderate segmental LV systolic dysfunction      ROS: All systems negative except as listed in HPI, PMH and Problem List.  SH:  Social History   Socioeconomic History   Marital status: Divorced    Spouse name: Not on file   Number of children: 1   Years of education: Not on file   Highest education level: High school graduate  Occupational History    Employer: OTHER   Occupation: Disablity    Comment: Since 2013  Tobacco Use   Smoking status: Some Days    Packs/day: 0.25    Years: 25.00    Total pack years: 6.25  Types: Cigarettes   Smokeless tobacco: Never   Tobacco comments:    Smoking cessation  Vaping Use   Vaping Use: Never used  Substance and Sexual Activity   Alcohol use: Yes    Alcohol/week: 2.0 standard drinks of alcohol    Types: 2 Cans of beer per week    Comment: socially   Drug use: No   Sexual activity: Yes  Other Topics Concern   Not on file  Social History Narrative   Disabled - no longer works   Social Determinants of Radio broadcast assistant Strain: [place]  (03/30/2022)   Overall Financial Resource Strain (CARDIA)    Difficulty of Paying Living Expenses: Not very hard  Food Insecurity: No Food Insecurity (03/30/2022)   Hunger Vital  Sign    Worried About Running Out of Food in the Last Year: Never true    Ran Out of Food in the Last Year: Never true  Transportation Needs: No Transportation Needs (03/30/2022)   PRAPARE - Hydrologist (Medical): No    Lack of Transportation (Non-Medical): No  Physical Activity: Not on file  Stress: Not on file  Social Connections: Not on file  Intimate Partner Violence: Not on file    FH:  Family History  Problem Relation Age of Onset   Cancer Father        Lung   Diabetes Mother    COPD Mother    Cancer Maternal Uncle        Colon    Past Medical History:  Diagnosis Date   Acute myocardial infarction of other anterior wall, subsequent episode of care 12/12/2011   Anal fissure    Arthritis    back   Atherosclerosis of native artery of both lower extremities with intermittent claudication ([place]) 04/01/2018   Bruises easily    d/t being on Effient   CAD (coronary artery disease)    A. Acute Ant STEMI 12/12/2011    Cardiomyopathy secondary 07/17/4817   Chronic systolic CHF (congestive heart failure) ([place])    ischemic CM // Echo 6/21: EF 30-35  //  Echocardiogram 10/21: LV apical thrombus resolved, EF 50-55, ant and apical, ant-lat HK, trivial MR, trivial AI   Chronic total occlusion of artery of the extremities ([place]) 02/26/2012   Claudication ([place]) 12/26/2011   Coronary atherosclerosis of native coronary artery    a. ant STEMI with cardiac arrest 2013 s/p DES to mLAD. b. [place] 10/2016 s/p PTCA to mLAD.   Diabetes mellitus without complication ([place])    Essential hypertension 10/23/2016   GERD (gastroesophageal reflux disease)    "takes tums"   History of blood transfusion    no abnormal reaction noted   History of kidney stones    Hypertension    Ischemic cardiomyopathy    a. EF 35% in 02/2012 at time of acute MI, improved to normal on subsequent imaging.   MI (myocardial infarction) ([place])    AMI 5/63 - complicated by VT/Tosades   Mixed  hyperlipidemia    Numbness and tingling of right arm 03/31/2013   Occlusion and stenosis of carotid artery without mention of cerebral infarction 08/26/2012   PAD (peripheral artery disease) ([place])    followed by Vascular - aortobifem bypass 01/2013 & left carotid-subclavian artery bypass 04/2013   Peripheral vascular disease, unspecified ([place]) 12/16/2012   PVD (peripheral vascular disease) ([place]) 03/31/2013   Rectal polyp    Subclavian steal syndrome 08/11/2013   Uncontrolled diabetes mellitus 10/23/2016  Wears partial dentures    upper    Current Outpatient Medications  Medication Sig Dispense Refill   aspirin EC 81 MG tablet Take 1 tablet (81 mg total) by mouth daily. 90 tablet 3   carvedilol (COREG) 25 MG tablet Take 1 tablet (25 mg total) by mouth 2 (two) times daily. 180 tablet 3   cetirizine (ZYRTEC) 10 MG tablet Take 10 mg by mouth daily.     cyclobenzaprine (FLEXERIL) 10 MG tablet Take 1 tablet (10 mg total) by mouth 2 (two) times daily as needed for muscle spasms. 30 tablet 0   diazepam (VALIUM) 5 MG tablet Take 5 mg by mouth daily as needed for anxiety.     ELIQUIS 5 MG TABS tablet TAKE 1 TABLET BY MOUTH 2 TIMES A DAY 180 tablet 1   ENTRESTO 24-26 MG TAKE 1 TABLET BY MOUTH TWICE DAILY 60 tablet 0   FARXIGA 10 MG TABS tablet Take 10 mg by mouth daily.     furosemide (LASIX) 40 MG tablet TAKE 1 TABLET BY MOUTH EACH DAY 90 tablet 3   gabapentin (NEURONTIN) 300 MG capsule Take 300 mg by mouth 3 (three) times daily.     insulin glargine (LANTUS) 100 UNIT/ML Solostar Pen Inject 50 Units into the skin 2 (two) times daily.     insulin lispro (HUMALOG) 100 UNIT/ML injection Inject 2-15 Units into the skin 3 (three) times daily before meals. sliding scale .     naloxone (NARCAN) nasal spray 4 mg/0.1 mL Place 4 mg into the nose once as needed (overdose).     nitroGLYCERIN (NITROSTAT) 0.4 MG SL tablet Place 0.4 mg under the tongue every 5 (five) minutes as needed for chest pain.      oxyCODONE-acetaminophen (PERCOCET) 10-325 MG tablet Take 1 tablet by mouth 4 (four) times daily.     pantoprazole (PROTONIX) 40 MG tablet Take 40 mg by mouth daily.     rosuvastatin (CRESTOR) 20 MG tablet Take 2 tablets (40 mg total) by mouth daily. 180 tablet 3   No current facility-administered medications for this encounter.    Vitals:   12/20/22 0900  BP: 100/60  Pulse: 70  Weight: 104.3 kg (230 lb)   Wt Readings from Last 3 Encounters:  12/20/22 104.3 kg (230 lb)  12/11/22 100.7 kg (222 lb)  04/11/22 102.7 kg (226 lb 6.4 oz)    PHYSICAL EXAM: General:  Well appearing. No resp difficulty HEENT: normal Neck: supple. JVP flat. Carotids 2+ bilaterally; no bruits. No lymphadenopathy or thryomegaly appreciated. Cor: PMI normal. Regular rate & rhythm. No rubs, gallops or murmurs. Lungs: clear Abdomen: soft, nontender, nondistended. No hepatosplenomegaly. No bruits or masses. Good bowel sounds. Extremities: no cyanosis, clubbing, rash, edema Neuro: alert & orientedx3, cranial nerves grossly intact. Moves all 4 extremities w/o difficulty. Affect pleasant.   ECG: SR 87 bpm personally checked.   ASSESSMENT & PLAN: 1. Chronic HFrEF, ICM -Echo LV EF 30-35%--> 07/2022 45-50%  - NYHA II.  Diuretic-Continue lasix 40 mg daily  BB-on goal dose of carvedilol  Ace/ARB/ARNI- Continue entresto 24-26 mg twice a day  MRA- Add next visit.  SGLT2i- Continue farxiga 10 mg daily  Check BMET   2. CAD H/O CABG x4 (LIMA to LAD, SVG to Diagonal and SVG to RCA). [facility] April 2023 with severe disease in the SVG to RCA treated with a drug eluting stent.  - On asa+ eliquis  + crestor  - Triglycerides 545--> Refer to Lipid  Clinic. Not sure what happened with that.  - Check lipids today.   3. Syncope -Resolved after stopping imdur.    4. PAF -SR 87 bpm  -On eliquis 5 mg twice a day  - Continue carvedilol 25 mg twice a day  - Set up sleep study    5. CKD Stage  IIIa -Creatinine baseline 1.3-1.4  -Check BMET today.    6. Suspected Sleep Apnea  He has CAD/PAF/Obesity  Refer for sleep study.   7. PAD  Followed by Dr Myra Gianotti    Follow up with Dr [name] and with HF if needed.    Denisha Hoel NP-C  1:08 PM '

## 2022-12-20 NOTE — Progress Notes (Addendum)
Called Dr. Camillia Herter [phone] office, was on hold for a while received a call back from the prompts. Pt already had left and the office stated that they would call pt and schedule a follow up appointment.

## 2022-12-29 ENCOUNTER — Other Ambulatory Visit (HOSPITAL_COMMUNITY): Payer: Self-pay | Admitting: Adult Health

## 2023-01-02 ENCOUNTER — Encounter (HOSPITAL_COMMUNITY): Payer: Self-pay | Admitting: Surgery

## 2023-01-02 ENCOUNTER — Ambulatory Visit (HOSPITAL_COMMUNITY)
Admission: RE | Admit: 2023-01-02 | Discharge: 2023-01-02 | Disposition: A | Discharge status: Home / Self Care | Payer: Medicaid Other | Attending: Surgery | Admitting: Surgery

## 2023-01-02 ENCOUNTER — Encounter (HOSPITAL_COMMUNITY)
Admission: RE | Disposition: A | Discharge status: Home / Self Care | Payer: Self-pay | Source: Home / Self Care | Attending: Surgery

## 2023-01-02 DIAGNOSIS — Z79899 Other long term (current) drug therapy: Secondary | ICD-10-CM | POA: Insufficient documentation

## 2023-01-02 DIAGNOSIS — Z7982 Long term (current) use of aspirin: Secondary | ICD-10-CM | POA: Insufficient documentation

## 2023-01-02 DIAGNOSIS — I6523 Occlusion and stenosis of bilateral carotid arteries: Secondary | ICD-10-CM | POA: Diagnosis not present

## 2023-01-02 DIAGNOSIS — I70213 Atherosclerosis of native arteries of extremities with intermittent claudication, bilateral legs: Secondary | ICD-10-CM | POA: Insufficient documentation

## 2023-01-02 DIAGNOSIS — Z7901 Long term (current) use of anticoagulants: Secondary | ICD-10-CM | POA: Insufficient documentation

## 2023-01-02 HISTORY — PX: PERIPHERAL VASCULAR BALLOON ANGIOPLASTY: CATH118281

## 2023-01-02 HISTORY — PX: ABDOMINAL AORTOGRAM W/LOWER EXTREMITY: CATH118223

## 2023-01-02 LAB — GLUCOSE, CAPILLARY
Glucose-Capillary: 207 mg/dL — ABNORMAL HIGH (ref 70–99)
Glucose-Capillary: 144 mg/dL — ABNORMAL HIGH (ref 70–99)

## 2023-01-02 SURGERY — ABDOMINAL AORTOGRAM W/LOWER EXTREMITY
Anesthesia: LOCAL

## 2023-01-02 MED ORDER — ASPIRIN 81 MG PO TBEC: 81.0000 mg | DELAYED_RELEASE_TABLET | Freq: Every day | ORAL | Status: DC

## 2023-01-02 MED ORDER — LIDOCAINE HCL (PF) 1 % IJ SOLN
INTRAMUSCULAR | Status: DC | PRN
  Administered 2023-01-02: 15 mL

## 2023-01-02 MED ORDER — HEPARIN (PORCINE) IN NACL 1000-0.9 UT/500ML-% IV SOLN
INTRAVENOUS | Status: DC | PRN
  Administered 2023-01-02 (×2): 500 mL

## 2023-01-02 MED ORDER — SODIUM CHLORIDE 0.9 % WEIGHT BASED INFUSION: 1.0000 mL/kg/h | INTRAVENOUS | Status: DC

## 2023-01-02 MED ORDER — HEPARIN SODIUM (PORCINE) 1000 UNIT/ML IJ SOLN
INTRAMUSCULAR | Status: DC | PRN
  Administered 2023-01-02: 10000 [IU] via INTRAVENOUS

## 2023-01-02 MED ORDER — ONDANSETRON HCL 4 MG/2ML IJ SOLN: 4.0000 mg | Freq: Four times a day (QID) | INTRAMUSCULAR | Status: DC | PRN

## 2023-01-02 MED ORDER — ACETAMINOPHEN 325 MG PO TABS: 650.0000 mg | ORAL_TABLET | ORAL | Status: DC | PRN

## 2023-01-02 MED ORDER — HYDRALAZINE HCL 20 MG/ML IJ SOLN: 5.0000 mg | INTRAMUSCULAR | Status: DC | PRN

## 2023-01-02 MED ORDER — MIDAZOLAM HCL 2 MG/2ML IJ SOLN
INTRAMUSCULAR | Status: AC
  Filled 2023-01-02: qty 2

## 2023-01-02 MED ORDER — MIDAZOLAM HCL 2 MG/2ML IJ SOLN
INTRAMUSCULAR | Status: DC | PRN
  Administered 2023-01-02 (×2): 1 mg via INTRAVENOUS

## 2023-01-02 MED ORDER — FENTANYL CITRATE (PF) 100 MCG/2ML IJ SOLN
INTRAMUSCULAR | Status: DC | PRN
  Administered 2023-01-02: 50 ug via INTRAVENOUS

## 2023-01-02 MED ORDER — MORPHINE SULFATE (PF) 2 MG/ML IV SOLN: 2.0000 mg | INTRAVENOUS | Status: DC | PRN

## 2023-01-02 MED ORDER — FENTANYL CITRATE (PF) 100 MCG/2ML IJ SOLN
INTRAMUSCULAR | Status: AC
  Filled 2023-01-02: qty 2

## 2023-01-02 MED ORDER — ALUM & MAG HYDROXIDE-SIMETH 200-200-20 MG/5ML PO SUSP
15.0000 mL | Freq: Once | ORAL | Status: AC
  Administered 2023-01-02: 15 mL via ORAL

## 2023-01-02 MED ORDER — IODIXANOL 320 MG/ML IV SOLN
INTRAVENOUS | Status: DC | PRN
  Administered 2023-01-02: 137 mL via INTRA_ARTERIAL

## 2023-01-02 MED ORDER — SODIUM CHLORIDE 0.9 % IV SOLN: 250.0000 mL | INTRAVENOUS | Status: DC | PRN

## 2023-01-02 MED ORDER — HEPARIN SODIUM (PORCINE) 1000 UNIT/ML IJ SOLN
INTRAMUSCULAR | Status: AC
  Filled 2023-01-02: qty 10

## 2023-01-02 MED ORDER — SODIUM CHLORIDE 0.9 % IV SOLN: INTRAVENOUS | Status: DC

## 2023-01-02 MED ORDER — SODIUM CHLORIDE 0.9% FLUSH: 3.0000 mL | INTRAVENOUS | Status: DC | PRN

## 2023-01-02 MED ORDER — OXYCODONE HCL 5 MG PO TABS: 5.0000 mg | ORAL_TABLET | ORAL | Status: DC | PRN

## 2023-01-02 MED ORDER — SODIUM CHLORIDE 0.9% FLUSH: 3.0000 mL | Freq: Two times a day (BID) | INTRAVENOUS | Status: DC

## 2023-01-02 MED ORDER — LABETALOL HCL 5 MG/ML IV SOLN: 10.0000 mg | INTRAVENOUS | Status: DC | PRN

## 2023-01-02 SURGICAL SUPPLY — 22 items
BALLN STERLING OTW 8X40X135 (BALLOONS) ×2
BALLOON STERLING OTW 8X40X135 (BALLOONS) IMPLANT
CATH NAVICROSS ANGLED 90CM (MICROCATHETER) IMPLANT
CATH OMNI FLUSH 5F 65CM (CATHETERS) IMPLANT
CATH SOFTOUCH MOTARJEME 5F (CATHETERS) IMPLANT
CATH SOS OMNI O 5F 80CM (CATHETERS) IMPLANT
DCB RANGER 6.0X40 135 (BALLOONS) IMPLANT
DEVICE VASC CLSR CELT ART 5 (Vascular Products) IMPLANT
GLIDEWIRE ADV .035X260CM (WIRE) IMPLANT
KIT ENCORE 26 ADVANTAGE (KITS) IMPLANT
KIT MICROPUNCTURE NIT STIFF (SHEATH) IMPLANT
KIT PV (KITS) ×2 IMPLANT
RANGER DCB 6.0X40 135 (BALLOONS) ×2
SHEATH CATAPULT 5F 45 MP (SHEATH) IMPLANT
SHEATH PINNACLE 5F 10CM (SHEATH) IMPLANT
SYR MEDRAD MARK V 150ML (SYRINGE) IMPLANT
TRANSDUCER W/STOPCOCK (MISCELLANEOUS) ×2 IMPLANT
TRAY PV CATH (CUSTOM PROCEDURE TRAY) ×2 IMPLANT
WIRE AMPLATZ SS-J .035X180CM (WIRE) IMPLANT
WIRE BENTSON .035X145CM (WIRE) IMPLANT
WIRE G V18X300CM (WIRE) IMPLANT
WIRE HI TORQ VERSACORE 300 (WIRE) IMPLANT

## 2023-01-02 NOTE — Discharge Instructions (Addendum)
Restart Eliquis tonight.

## 2023-01-02 NOTE — Op Note (Signed)
Patient name: Ryan [name] MRN: [id number] DOB: 06-Oct-1966 Sex: male  01/02/2023 Pre-operative Diagnosis: Bilateral claudication Post-operative diagnosis:  Same Surgeon:  Annamarie Major Procedure Performed:  1.  Ultrasound-guided access, left femoral artery  2.  Abdominal aortogram  3.  Bilateral lower extremity runoff  4.  Drug-coated balloon angioplasty, right common femoral artery  5.  Conscious sedation, 71 minutes  6.  Closure device, Celt   Indications: This is a 57 year old gentleman with bilateral claudication.  He has a history of an aortobifemoral bypass as well as a right femoral-popliteal bypass and left superficial femoral artery stenting.  18 months ago he had intervention to the right femoral anastomosis.  He is having recurrent symptoms.  He comes in for further evaluation.  Procedure:  The patient was identified in the holding area and taken to room 8.  The patient was then placed supine on the table and prepped and draped in the usual sterile fashion.  A time out was called.  Conscious sedation was administered with the use of IV fentanyl and Versed under continuous physician and nurse monitoring.  Heart rate, blood pressure, and oxygen saturation were continuously monitored.  Total sedation time was 71 minutes.  Ultrasound was used to evaluate the left common femoral artery.  It was patent .  A digital ultrasound image was acquired.  A micropuncture needle was used to access the left common femoral artery under ultrasound guidance.  An 018 wire was advanced without resistance and a micropuncture sheath was placed.  The 018 wire was removed and a benson wire was placed.  The micropuncture sheath was exchanged for a 5 french sheath.  An omniflush catheter was advanced over the wire to the level of L-1.  An abdominal angiogram was obtained.  Next a SOS catheter was placed into the right right limb of the aortobifemoral graft and right leg runoff was performed.  Retrograde  injections through the sheath in the left groin were used to evaluate the left leg.  Findings:    Aortogram: No significant renal artery stenosis was identified.  The aortic graft is widely patent without stenosis.  Both limbs of the graft are widely patent.            Right Lower Extremity: At the level of the common femoral anastomosis to the aortic graft, there is a significant lesion, approximately 80%.  The profundofemoral artery is widely patent.  The femoral-popliteal bypass graft is widely patent.  There is three-vessel runoff to the ankle with the posterior tibial his abdominal vessel across the ankle             Left Lower Extremity: There is significant stenosis within the common femoral artery, greater than 80%.   The profundofemoral artery is widely patent.  The stents within the superficial femoral artery are widely patent with three-vessel runoff.  Intervention: After the above images were acquired the decision was made to proceed with intervention.  In order to get access over the bifurcation, I used a Latarjet may, Glidewire advantage, Amplatz Super Stiff wire, and a Nava cross catheter.  I was then able to get a 5 [place] catapulted sheath barely over the bifurcation.  I then placed a V-18 wire across the lesion.  The patient was fully heparinized.  I first treated this with a 6 x 40 Ranger drug-coated balloon for 3 minutes.  I then upsized to an 8 x 40 Sterling balloon.  At the completion, there was less than 15% stenosis.  A Celt was used for closure.  Impression:  #1  Successful drug-coated balloon angioplasty of the right common femoral artery using a 6 mm Ranger followed by an 8 mm Sterling balloon to treat an 80% lesion with residual stenosis down to less than 20%.  #2 right femoral-popliteal bypass is widely  #3  Left superficial femoral artery stents are without significant stenosis  #4  High-grade stenosis of the left common femoral artery which will be addressed via femoral  endarterectomy  #5  The patient will not be started on Plavix as he is on Eliquis (bleeding risk).  He will continue his aspirin.   Theotis Burrow, M.D., [facility] [place] Office: [phone] Pager:  [phone]

## 2023-01-02 NOTE — Interval H&P Note (Signed)
History and Physical Interval Note:  01/02/2023 7:44 AM  Ryan [name]  has presented today for surgery, with the diagnosis of life style limited claudication.  The various methods of treatment have been discussed with the patient and family. After consideration of risks, benefits and other options for treatment, the patient has consented to  Procedure(s): ABDOMINAL AORTOGRAM W/LOWER EXTREMITY (N/A) as a surgical intervention.  The patient's history has been reviewed, patient examined, no change in status, stable for surgery.  I have reviewed the patient's chart and labs.  Questions were answered to the patient's satisfaction.     Annamarie Major

## 2023-01-02 NOTE — Progress Notes (Signed)
Patient and wife was given discharge instructions. Both verbalized understanding. 

## 2023-01-05 ENCOUNTER — Telehealth: Payer: Self-pay

## 2023-01-05 NOTE — Telephone Encounter (Signed)
Attempted to reach patient to schedule surgery. Left message for patient to return call.

## 2023-01-08 ENCOUNTER — Other Ambulatory Visit: Payer: Self-pay

## 2023-01-08 DIAGNOSIS — I739 Peripheral vascular disease, unspecified: Secondary | ICD-10-CM

## 2023-01-08 NOTE — Telephone Encounter (Signed)
Spoke with patient and scheduled surgery for 01/26/23. Reviewed instructions, which includes patient to continue ASA and hold Eliquis 3 days prior. Patient verbalized understanding.

## 2023-01-19 NOTE — Progress Notes (Signed)
Surgical Instructions    Your procedure is scheduled on February 23.  Report to [facility] Main Entrance "A" at 5:30 A.M., then check in with the Admitting office.  Call this number if you have problems the morning of surgery:  [phone]   If you have any questions prior to your surgery date call [phone]: Open Monday-Friday 8am-4pm If you experience any cold or flu symptoms such as cough, fever, chills, shortness of breath, etc. between now and your scheduled surgery, please notify us at the above number     Remember:  Do not eat or drink anything after midnight the night before your surgery    Take these medicines the morning of surgery with A SIP OF WATER: aspirin EC  carvedilol (COREG)  gabapentin (NEURONTIN)  oxyCODONE-acetaminophen (PERCOCET)  pantoprazole (PROTONIX)   IF NEEDED YOU MAY TAKE: albuterol (VENTOLIN HFA)  cetirizine (ZYRTEC)  cyclobenzaprine (FLEXERIL)  naloxone (NARCAN)  nitroGLYCERIN (NITROSTAT)   As of today, STOP taking any Aleve, Naproxen, Ibuprofen, Motrin, Advil, Goody's, BC's, all herbal medications, fish oil, and all vitamins.  HOLD ELIQUIS 3 DAYS PRIOR TO SURGERY.    WHAT DO I DO ABOUT MY DIABETES MEDICATION?  HOLD FARXIGA 72 HOURS PRIOR TO SURGERY.  Do not take oral diabetes medicines (pills) the morning of surgery.  THE NIGHT BEFORE SURGERY, take 25 units of insulin glargine (LANTUS) insulin.     THE MORNING OF SURGERY, take 25 units of _insulin glargine (LANTUS) insulin.   If your CBG is greater than 220 mg/dL, you may take  of your sliding scale (correction) dose of insulin lispro (HUMALOG)  .   HOW TO MANAGE YOUR DIABETES BEFORE AND AFTER SURGERY  Why is it important to control my blood sugar before and after surgery? Improving blood sugar levels before and after surgery helps healing and can limit problems. A way of improving blood sugar control is eating a healthy diet by:  Eating less sugar and carbohydrates   Increasing activity/exercise  Talking with your doctor about reaching your blood sugar goals High blood sugars (greater than 180 mg/dL) can raise your risk of infections and slow your recovery, so you will need to focus on controlling your diabetes during the weeks before surgery. Make sure that the doctor who takes care of your diabetes knows about your planned surgery including the date and location.  How do I manage my blood sugar before surgery? Check your blood sugar at least 4 times a day, starting 2 days before surgery, to make sure that the level is not too high or low.  Check your blood sugar the morning of your surgery when you wake up and every 2 hours until you get to the Short Stay unit.  If your blood sugar is less than 70 mg/dL, you will need to treat for low blood sugar: Do not take insulin. Treat a low blood sugar (less than 70 mg/dL) with  cup of clear juice (cranberry or apple), 4 glucose tablets, OR glucose gel. Recheck blood sugar in 15 minutes after treatment (to make sure it is greater than 70 mg/dL). If your blood sugar is not greater than 70 mg/dL on recheck, call [phone] for further instructions. Report your blood sugar to the short stay nurse when you get to Short Stay.  If you are admitted to the hospital after surgery: Your blood sugar will be checked by the staff and you will probably be given insulin after surgery (instead of oral diabetes medicines) to make sure you  have good blood sugar levels. The goal for blood sugar control after surgery is 80-180 mg/dL.         Do not wear jewelry or makeup. Do not wear lotions, powders, perfumes/cologne or deodorant. Do not shave 48 hours prior to surgery.  Men may shave face and neck. Do not bring valuables to the hospital. Do not wear nail polish, gel polish, artificial nails, or any other type of covering on natural nails (fingers and toes) If you have artificial nails or gel coating that need to be removed by a  nail salon, please have this removed prior to surgery. Artificial nails or gel coating may interfere with anesthesia's ability to adequately monitor your vital signs.  [facility] is not responsible for any belongings or valuables.    Do NOT Smoke (Tobacco/Vaping)  24 hours prior to your procedure  If you use a CPAP at night, you may bring your mask for your overnight stay.   Contacts, glasses, hearing aids, dentures or partials may not be worn into surgery, please bring cases for these belongings   For patients admitted to the hospital, discharge time will be determined by your treatment team.   Patients discharged the day of surgery will not be allowed to drive home, and someone needs to stay with them for 24 hours.   SURGICAL WAITING ROOM VISITATION Patients having surgery or a procedure may have no more than 2 support people in the waiting area - these visitors may rotate.   Children under the age of 67 must have an adult with them who is not the patient. If the patient needs to stay at the hospital during part of their recovery, the visitor guidelines for inpatient rooms apply. Pre-op nurse will coordinate an appropriate time for 1 support person to accompany patient in pre-op.  This support person may not rotate.   Please refer to [email or web address].hu for the visitor guidelines for Inpatients (after your surgery is over and you are in a regular room).    Special instructions:    Oral Hygiene is also important to reduce your risk of infection.  Remember - BRUSH YOUR TEETH THE MORNING OF SURGERY WITH YOUR REGULAR TOOTHPASTE   Brush- Preparing For Surgery  Before surgery, you can play an important role. Because skin is not sterile, your skin needs to be as free of germs as possible. You can reduce the number of germs on your skin by washing with CHG (chlorahexidine gluconate) Soap before surgery.  CHG is an antiseptic cleaner  which kills germs and bonds with the skin to continue killing germs even after washing.     Please do not use if you have an allergy to CHG or antibacterial soaps. If your skin becomes reddened/irritated stop using the CHG.  Do not shave (including legs and underarms) for at least 48 hours prior to first CHG shower. It is OK to shave your face.  Please follow these instructions carefully.     Shower the NIGHT BEFORE SURGERY and the MORNING OF SURGERY with CHG Soap.   If you chose to wash your hair, wash your hair first as usual with your normal shampoo. After you shampoo, rinse your hair and body thoroughly to remove the shampoo.  Then [employer] and genitals (private parts) with your normal soap and rinse thoroughly to remove soap.  After that Use CHG Soap as you would any other liquid soap. You can apply CHG directly to the skin and wash gently with a  scrungie or a clean washcloth.   Apply the CHG Soap to your body ONLY FROM THE NECK DOWN.  Do not use on open wounds or open sores. Avoid contact with your eyes, ears, mouth and genitals (private parts). Wash Face and genitals (private parts)  with your normal soap.   Wash thoroughly, paying special attention to the area where your surgery will be performed.  Thoroughly rinse your body with warm water from the neck down.  DO NOT shower/wash with your normal soap after using and rinsing off the CHG Soap.  Pat yourself dry with a CLEAN TOWEL.  Wear CLEAN PAJAMAS to bed the night before surgery  Place CLEAN SHEETS on your bed the night before your surgery  DO NOT SLEEP WITH PETS.   Day of Surgery:  Take a shower with CHG soap. Wear Clean/Comfortable clothing the morning of surgery Do not apply any deodorants/lotions.   Remember to brush your teeth WITH YOUR REGULAR TOOTHPASTE.    If you received a COVID test during your pre-op visit, it is requested that you wear a mask when out in public, stay away from anyone that may not be  feeling well, and notify your surgeon if you develop symptoms. If you have been in contact with anyone that has tested positive in the last 10 days, please notify your surgeon.    Please read over the following fact sheets that you were given.

## 2023-01-22 ENCOUNTER — Other Ambulatory Visit: Payer: Self-pay

## 2023-01-22 ENCOUNTER — Encounter (HOSPITAL_COMMUNITY)
Admission: RE | Admit: 2023-01-22 | Discharge: 2023-01-22 | Disposition: A | Discharge status: Home / Self Care | Payer: Medicaid Other | Source: Ambulatory Visit | Attending: Surgery | Admitting: Surgery

## 2023-01-22 ENCOUNTER — Encounter (HOSPITAL_COMMUNITY): Payer: Self-pay

## 2023-01-22 VITALS — BP 103/51 | HR 87 | Temp 97.8°F | Resp 19 | Ht 69.0 in | Wt 230.0 lb

## 2023-01-22 DIAGNOSIS — I739 Peripheral vascular disease, unspecified: Secondary | ICD-10-CM

## 2023-01-22 DIAGNOSIS — Z01812 Encounter for preprocedural laboratory examination: Secondary | ICD-10-CM | POA: Diagnosis not present

## 2023-01-22 DIAGNOSIS — Z01818 Encounter for other preprocedural examination: Secondary | ICD-10-CM

## 2023-01-22 DIAGNOSIS — E1151 Type 2 diabetes mellitus with diabetic peripheral angiopathy without gangrene: Secondary | ICD-10-CM | POA: Diagnosis not present

## 2023-01-22 DIAGNOSIS — Z794 Long term (current) use of insulin: Secondary | ICD-10-CM | POA: Diagnosis not present

## 2023-01-22 LAB — CBC
HCT: 50.5 % (ref 39.0–52.0)
Hemoglobin: 16.3 g/dL (ref 13.0–17.0)
MCH: 31.2 pg (ref 26.0–34.0)
MCHC: 32.3 g/dL (ref 30.0–36.0)
MCV: 96.7 fL (ref 80.0–100.0)
Platelets: 221 10*3/uL (ref 150–400)
RBC: 5.22 MIL/uL (ref 4.22–5.81)
RDW: 14.7 % (ref 11.5–15.5)
WBC: 8.8 10*3/uL (ref 4.0–10.5)
nRBC: 0 % (ref 0.0–0.2)

## 2023-01-22 LAB — COMPREHENSIVE METABOLIC PANEL
ALT: 25 U/L (ref 0–44)
AST: 27 U/L (ref 15–41)
Albumin: 3.4 g/dL — ABNORMAL LOW (ref 3.5–5.0)
Alkaline Phosphatase: 137 U/L — ABNORMAL HIGH (ref 38–126)
Anion gap: 13 (ref 5–15)
BUN: 8 mg/dL (ref 6–20)
CO2: 25 mmol/L (ref 22–32)
Calcium: 8.7 mg/dL — ABNORMAL LOW (ref 8.9–10.3)
Chloride: 101 mmol/L (ref 98–111)
Creatinine, Ser: 1.35 mg/dL — ABNORMAL HIGH (ref 0.61–1.24)
GFR, Estimated: >60 mL/min (ref >60)
Glucose, Bld: 166 mg/dL — ABNORMAL HIGH (ref 70–99)
Potassium: 3.5 mmol/L (ref 3.5–5.1)
Sodium: 139 mmol/L (ref 135–145)
Total Bilirubin: 0.8 mg/dL (ref 0.3–1.2)
Total Protein: 7.4 g/dL (ref 6.5–8.1)

## 2023-01-22 LAB — URINALYSIS, ROUTINE W REFLEX MICROSCOPIC
Bilirubin Urine: NEGATIVE
Glucose, UA: >=500 mg/dL — AB
Hgb urine dipstick: NEGATIVE
Ketones, ur: NEGATIVE mg/dL
Leukocytes,Ua: NEGATIVE
Nitrite: NEGATIVE
Protein, ur: NEGATIVE mg/dL
Specific Gravity, Urine: <1.005 — ABNORMAL LOW (ref 1.005–1.030)
pH: 6.5 (ref 5.0–8.0)

## 2023-01-22 LAB — HEMOGLOBIN A1C
Hgb A1c MFr Bld: 7.9 % — ABNORMAL HIGH (ref 4.8–5.6)
Mean Plasma Glucose: 180.03 mg/dL

## 2023-01-22 LAB — URINALYSIS, MICROSCOPIC (REFLEX)
Bacteria, UA: NONE SEEN
Squamous Epithelial / HPF: NONE SEEN /HPF (ref 0–5)

## 2023-01-22 LAB — PROTIME-INR
INR: 1.1 (ref 0.8–1.2)
Prothrombin Time: 14.4 seconds (ref 11.4–15.2)

## 2023-01-22 LAB — SURGICAL PCR SCREEN
MRSA, PCR: NEGATIVE
Staphylococcus aureus: NEGATIVE

## 2023-01-22 LAB — GLUCOSE, CAPILLARY: Glucose-Capillary: 277 mg/dL — ABNORMAL HIGH (ref 70–99)

## 2023-01-22 LAB — APTT: aPTT: 29 seconds (ref 24–36)

## 2023-01-22 NOTE — Progress Notes (Signed)
PCP - Nolene Ebbs MD Cardiologist - Gennette Pac  PPM/ICD - denies Device Orders -  Rep Notified -   Chest x-ray - 03/29/22 EKG - 12/20/22 Stress Test - 08/15/12 ECHO - 07/04/22 Cardiac Cath - 03/21/22  Sleep Study - denies CPAP -   Fasting Blood Sugar - 80-100 Checks Blood Sugar _three times a day  Last dose of GLP1 agonist-  na GLP1 instructions: na  Blood Thinner Instructions:Hold Eliquis 3 days prior to surgery. Aspirin Instructions:continue  ERAS Protcol -no PRE-SURGERY Ensure or G2-   COVID TEST- n/a   Anesthesia review: yes-cardiac history-MI,CHFPAF. Seen in heart failure clinic 12/20/22. Last seen by Dr. Angelena Form 04/03/22  Patient denies shortness of breath, fever, cough and chest pain at PAT appointment   All instructions explained to the patient, with a verbal understanding of the material. Patient agrees to go over the instructions while at home for a better understanding. Patient also instructed to wear a mask when out in public prior to surgery . The opportunity to ask questions was provided.

## 2023-01-23 NOTE — Progress Notes (Signed)
Anesthesia Chart Review:  Case: [id number] Date/Time: 01/26/23 0715   Procedure: REDO LEFT COMMON FEMORAL ENDARTERECTOMY WITH VPA (Left)   Anesthesia type: General   Pre-op diagnosis: Peripheral artery disease; Claudication   Location: [facility] OR ROOM 12 / [place] [facility]   Surgeons: Serafina Mitchell, MD       DISCUSSION: Patient is a 57 year old male scheduled for the above procedure.  History includes smoking, CAD (anterior STEMI complicated by VT/cardiac arrest 12/2011 s/p DES LAD with PTCA for ISR 10/23/2016 & 06/02/2019; CABG 05/05/2020: LIMA-LAD, SVG-DIAG, SVG-dRCA; NSTEMI, s/p DES SVG-RCA 03/21/2022), ischemic cardiomyopathy, chronic combined systolic and diastolic CHF, PAF, HLD, HTN, PAD (with aortic occlusion, s/p aortobifemoral bypass graft 01/16/2013; right CFA angioplasty 06/26/2017, 06/28/2021 & 01/02/2023, right femoral endarterectomy, femoropopliteal above knee bypass 03/13/2018; left SFA stent 04/23/2018), left subclavian artery occlusion (s/p left carotid-subclavian bypass 04/03/2013, I&D left neck lymphocele 04/18/2013), DM2, GERD.  [facility] admission 03/20/2022 - 03/23/2022 for NSTEMI with syncope and chest pain. S/p coronary angiogram which revealed patent left main, LAD and left circumflex, patent SVG/TI, mild RCA 100% occlusion, SVG/LCA 99% occlusion status post PCI. Acute on chronic HF exacerbation. LVEF 25-30% by echo, s/p diuresis and GDMT.  [facility] admission 03/29/2022 - 03/31/2022 for dyspnea, syncope and acute on chronic systolic and diastolic HF.  EF 30 to 35%.  Required IV furosemide.  No arrhythmias noted on telemetry.  No significant bradycardia.  Orthostatic hypotension may be contributing to syncope as he reported dizziness upon starting Imdur and was held. HS Troponin 366>>430. Had recent PCI. Denied chest or arm pain. Elevated troponins thought to be likely related to demand ischemia in the setting of CHF/volume overload with AKI. No ischemic testing recommended at that  time.  Last cardiology visit at the [facility] Clinic by Darrick Grinder, NP on 12/20/2022. EF up to 45-50% by 07/2022 echo.  Syncope resolved after stopping Imdur.  Overall feeling fine except LE pain. Still smoking. On Eliquis for PAF.  Future sleep study recommended.  Continue Entresto, Lasix, Coreg, spironolactone, Farxiga, Crestor. As needed HF cardiology follow-up recommended. His primary cardiologist is [name]. He is now on Lipitor and not Crestor.   [facility] advised holding Eliquis for 3 days prior to surgery. He remains on ASA 81 mg.   He had an extensive cardiac history as outlined above. NSTEMI with DES within the past year. Would advise preoperative cardiology input. Will notify [facility] RN staff.   ADDENDUM 01/24/23 4:19 PM: He had a preoperative CV risk assessment today by Christen Bame, NP who wrote, "Preoperative Cardiovascular Risk Assessment:The patient is doing well from a cardiac perspective. Therefore, based on ACC/AHA guidelines, the patient would be at acceptable risk for the planned procedure without further cardiovascular testing. According to the Revised Cardiac Risk Index (RCRI), his Perioperative Risk of Major Cardiac Event is (%): 11. His Functional Capacity in METs is: 6.91 according to the Duke Activity Status Index (DASI).   The patient was advised that if he develops new symptoms prior to surgery to contact our office to arrange for a follow-up visit, and he verbalized understanding.     Per office protocol, patient can hold Eliquis for 3 days prior to procedure."      VS: BP (!) 103/51   Pulse 87   Temp 36.6 C   Resp 19   Ht 5' 9"$  (1.753 m)   Wt 104.3 kg   SpO2 98%   BMI 33.97 kg/m    PROVIDERS: Nolene Ebbs, MD  is PCP [name], MD is cardiologist. Last evaluated on 04/03/2022.   LABS: Labs reviewed: Acceptable for surgery. (all labs ordered are listed, but only abnormal results are displayed)  Labs Reviewed  GLUCOSE, CAPILLARY - Abnormal;  Notable for the following components:      Result Value   Glucose-Capillary 277 (*)    All other components within normal limits  HEMOGLOBIN A1C - Abnormal; Notable for the following components:   Hgb A1c MFr Bld 7.9 (*)    All other components within normal limits  COMPREHENSIVE METABOLIC PANEL - Abnormal; Notable for the following components:   Glucose, Bld 166 (*)    Creatinine, Ser 1.35 (*)    Calcium 8.7 (*)    Albumin 3.4 (*)    Alkaline Phosphatase 137 (*)    All other components within normal limits  URINALYSIS, ROUTINE W REFLEX MICROSCOPIC - Abnormal; Notable for the following components:   Specific Gravity, Urine <1.005 (*)    Glucose, UA >=500 (*)    All other components within normal limits  SURGICAL PCR SCREEN  CBC  PROTIME-INR  APTT  URINALYSIS, MICROSCOPIC (REFLEX)  TYPE AND SCREEN    Spirometry 05/05/2020:  Latest Reference Range & Units 05/05/20 06:15  FVC-Pre L 3.37  FVC-%Pred-Pre % 70  FEV1-Pre L 2.50  FEV1-%Pred-Pre % 67  Pre FEV1/FVC ratio % 74  FEV1FVC-%Pred-Pre % 96  FEF 25-75 Pre L/sec 1.89  FEF2575-%Pred-Pre % 58  FEV6-Pre L 3.34  FEV6-%Pred-Pre % 72  Pre FEV6/FVC Ratio % 99  FEV6FVC-%Pred-Pre % 103    IMAGES: CXR 03/29/2022: FINDINGS: Post sternotomy changes. Small bilateral pleural effusions. Cardiomegaly with vascular congestion and diffuse interstitial pulmonary edema. No consolidation or pneumothorax IMPRESSION: Cardiomegaly with bilateral pleural effusions, vascular congestion and interstitial   EKG: 12/20/2022:  Normal sinus rhythm Anterolateral infarct (cited on or before 29-Mar-2022) T wave abnormality, consider inferior ischemia Abnormal ECG When compared with ECG of 11-Apr-2022 14:01, T wave inversion now evident in Lateral leads Confirmed by Larae Grooms [phone]) on 12/21/2022 12:19:59 AM   CV: US Carotid 11/06/2022: Summary:  - Right Carotid: Velocities in the right ICA are consistent with a 1-39% stenosis.  -  Left Carotid: Velocities in the left ICA are consistent with a 1-39% stenosis.  - Vertebrals: Left vertebral artery demonstrates antegrade flow. Right  vertebral artery exhibits systolic deceleration, suggestive of possible proximal obstruction.  Subclavians: Right subclavian artery was stenotic. Normal flow hemodynamics were seen in the left subclavian artery. Patent left  subclavian-carotid bypass graft.    Echo 07/04/2022: IMPRESSIONS   1. Left ventricular ejection fraction, by estimation, is 45 to 50%. The  left ventricle has mildly decreased function. The left ventricle  demonstrates regional wall motion abnormalities (see scoring  diagram/findings for description). Left ventricular  diastolic parameters are consistent with Grade II diastolic dysfunction  (pseudonormalization). Elevated left ventricular end-diastolic pressure.   2. Right ventricular systolic function is normal. The right ventricular  size is normal. There is normal pulmonary artery systolic pressure.   3. Left atrial size was mildly dilated.   4. The mitral valve is normal in structure. No evidence of mitral valve  regurgitation. No evidence of mitral stenosis.   5. The aortic valve is tricuspid. There is mild calcification of the  aortic valve. There is mild thickening of the aortic valve. Aortic valve  regurgitation is trivial. No aortic stenosis is present.   6. The inferior vena cava is normal in size with greater than 50%  respiratory variability,  suggesting right atrial pressure of 3 mmHg.  - Comparison 03/30/22: LVEF 30-35%, akinesis of the LV mid-apical anteroseptal wall, anterior wall and apical segments, akinesis of the LV mid-apical inferior wall, grade 3 diastolic dysfunction, moderately reduced RVSF, RVSP 43.4 mmHg, moderately dilated LA, trivial MR/AR    Long term monitor 04/04/2022 - 04/07/2022: Sinus rhythm. (min HR of 66 bpm, max HR of 176 bpm, and avg HR of 97 bpm).  1 run of Ventricular Tachycardia  occurred lasting 6 beats  Rare premature atrial contractions   Cardiac cath/PCI 03/25/2022 ([facility]): Conclusion: Native RCA 99% mid long segmental stenosis , timi 1-2  SVG to RCA:  95% prox body focal stenosis, TIMI  1(culprit). Graft widely patent otherwise.  LM-nl  circ-nl  LAD; timi 3 flow; prox stent 30% diffuse ISR;  No competetive LIMA flow  SVG to diag-nl.  Collateralizes RPDA distally    PCI:   PCI of SVG to RCA;  0% residual. 3.5x18, timi 3 post.  Clot  embolization to distal PDA, broken up partially with guidewire.   Asymptomatic, no EKG changes.    Thoracic aortagram:  L subclavian artery CTO prox  Patent carotid-subclavian graft;  Minimal  LIMA filling noted   Intervention  Prox Graft lesion (Graft To Mid RCA): Stent: Drug-eluting stent was successfully placed. The stent used was a XIENCE SKYPOINT MULTI-LINK L18 MM OD3.5 MM [place]. Stent was deployed by way of balloon expansion. Maximum pressure: 14 atm. Inflation time: 20 sec. Supplies Used: SYSTEM CORONARY STENT XIENCE SKYPOINT MULTI-LINK PEBAX EVEROLIMUS COCR FLUORINATE COPOLYMER L18 MM L145 CM OD3.5 MM Post-Intervention Lesion Assessment: There is a 0% residual stenosis post intervention.      Past Medical History:  Diagnosis Date   Acute myocardial infarction of other anterior wall, subsequent episode of care 12/12/2011   Anal fissure    Arthritis    back   Atherosclerosis of native artery of both lower extremities with intermittent claudication ([place]) 04/01/2018   Bruises easily    d/t being on Effient   CAD (coronary artery disease)    A. Acute Ant STEMI 12/12/2011    Cardiomyopathy secondary [id number]   Chronic systolic CHF (congestive heart failure) ([place])    ischemic CM // Echo 6/21: EF 30-35  //  Echocardiogram 10/21: LV apical thrombus resolved, EF 50-55, ant and apical, ant-lat HK, trivial MR, trivial AI   Chronic total occlusion of artery of the extremities ([place]) 02/26/2012    Claudication ([place]) 12/26/2011   Coronary atherosclerosis of native coronary artery    a. ant STEMI with cardiac arrest 2013 s/p DES to mLAD. b. [place] 10/2016 s/p PTCA to mLAD.   Diabetes mellitus without complication ([place])    Essential hypertension 10/23/2016   GERD (gastroesophageal reflux disease)    "takes tums"   History of blood transfusion    no abnormal reaction noted   History of kidney stones    Hypertension    Ischemic cardiomyopathy    a. EF 35% in 02/2012 at time of acute MI, improved to normal on subsequent imaging.   MI (myocardial infarction) ([place])    AMI [id number] - complicated by VT/[place]   Mixed hyperlipidemia    Numbness and tingling of right arm 03/31/2013   Occlusion and stenosis of carotid artery without mention of cerebral infarction 08/26/2012   PAD (peripheral artery disease) ([place])    followed by Vascular - aortobifem bypass 01/2013 & left carotid-subclavian artery bypass 04/2013   Peripheral vascular  disease, unspecified ([place]) 12/16/2012   PVD (peripheral vascular disease) ([place]) 03/31/2013   Rectal polyp    Subclavian steal syndrome 08/11/2013   Uncontrolled diabetes mellitus 10/23/2016   Wears partial dentures    upper    Past Surgical History:  Procedure Laterality Date   ABDOMINAL AORTOGRAM W/LOWER EXTREMITY N/A 06/26/2017   Procedure: Abdominal Aortogram w/Lower Extremity;  Surgeon: Serafina Mitchell, MD;  Location: [place] [facility];  Service: Cardiovascular;  Laterality: N/A;   ABDOMINAL AORTOGRAM W/LOWER EXTREMITY N/A 06/28/2021   Procedure: ABDOMINAL AORTOGRAM W/LOWER EXTREMITY;  Surgeon: Serafina Mitchell, MD;  Location: [place] [facility];  Service: Cardiovascular;  Laterality: N/A;   ABDOMINAL AORTOGRAM W/LOWER EXTREMITY N/A 01/02/2023   Procedure: ABDOMINAL AORTOGRAM W/LOWER EXTREMITY;  Surgeon: Serafina Mitchell, MD;  Location: [place] [facility];  Service: Cardiovascular;  Laterality: N/A;   ANAL FISSURECTOMY     AORTA - BILATERAL FEMORAL ARTERY BYPASS  GRAFT N/A 01/16/2013   Procedure: AORTA BIFEMORAL BYPASS GRAFT;  Surgeon: Serafina Mitchell, MD;  Location: [place];  Service: Vascular;  Laterality: N/A;   APPENDECTOMY     CARDIAC CATHETERIZATION N/A 10/23/2016   Procedure: Left Heart Cath and Coronary Angiography;  Surgeon: Burnell Blanks, MD;  Location: [place] [facility];  Service: Cardiovascular;  Laterality: N/A;   CARDIAC CATHETERIZATION N/A 10/23/2016   Procedure: Coronary Balloon Angioplasty;  Surgeon: Burnell Blanks, MD;  Location: [place] [facility];  Service: Cardiovascular;  Laterality: N/A;   CAROTID-SUBCLAVIAN BYPASS GRAFT Left 04/03/2013   Procedure: BYPASS GRAFT CAROTID-SUBCLAVIAN;  Surgeon: Serafina Mitchell, MD;  Location: [place];  Service: Vascular;  Laterality: Left;   CIRCUMCISION N/A 12/15/2015   Procedure: CIRCUMCISION ADULT;  Surgeon: Cleon Gustin, MD;  Location: [facility];  Service: Urology;  Laterality: N/A;   COLONOSCOPY     CORONARY ANGIOPLASTY     stent placed Dec 12, 2011 and 2018   CORONARY ARTERY BYPASS GRAFT N/A 05/05/2020   Procedure: CORONARY ARTERY BYPASS GRAFTING (CABG) x Three, Using Left internal Mammary Artery and Left Leg greater saphenous vein harvested endoscopically;  Surgeon: Grace Isaac, MD;  Location: [place];  Service: Open Heart Surgery;  Laterality: N/A;   CORONARY BALLOON ANGIOPLASTY N/A 05/23/2019   Procedure: CORONARY BALLOON ANGIOPLASTY;  Surgeon: Burnell Blanks, MD;  Location: [place] [facility];  Service: Cardiovascular;  Laterality: N/A;   DIAGNOSTIC LAPAROSCOPY     ENDARTERECTOMY FEMORAL Right 03/13/2018   Procedure: FEMORAL ENDARTERECTOMY RIGHT;  Surgeon: Serafina Mitchell, MD;  [facility];  Service: Vascular;  Laterality: Right;   FEMORAL-POPLITEAL BYPASS GRAFT Right 03/13/2018   Procedure: FEMORAL-POPLITEAL ARTERY BYPASS WITH NON-REVERSE VEIN RIGHT;  Surgeon: Serafina Mitchell, MD;  [facility];  Service: Vascular;  Laterality: Right;   GROIN DISSECTION Right  03/13/2018   Procedure: RE-DO COMMON FEMORAL ARTERY EXPOSURE;  Surgeon: Serafina Mitchell, MD;  [facility];  Service: Vascular;  Laterality: Right;   I & D EXTREMITY Left 04/18/2013   Procedure: debridement of left neck lymphocele;  Surgeon: Serafina Mitchell, MD;  Location: [place];  Service: Vascular;  Laterality: Left;  I and D of left neck   I & D EXTREMITY Right 11/21/2014   Procedure: IRRIGATION AND DEBRIDEMENT RIGHT HAND;  Surgeon: Roseanne Kaufman, MD;  Location: [place];  Service: Orthopedics;  Laterality: Right;   INTRAVASCULAR PRESSURE WIRE/FFR STUDY N/A 04/22/2020   Procedure: INTRAVASCULAR PRESSURE WIRE/FFR STUDY;  Surgeon: Burnell Blanks, MD;  Location: [place] [facility];  Service: Cardiovascular;  Laterality: N/A;   LEFT HEART CATH AND CORONARY ANGIOGRAPHY N/A 05/23/2019   Procedure: LEFT HEART CATH AND CORONARY ANGIOGRAPHY;  Surgeon: Burnell Blanks, MD;  Location: [place] [facility];  Service: Cardiovascular;  Laterality: N/A;   LEFT HEART CATH AND CORONARY ANGIOGRAPHY N/A 04/22/2020   Procedure: LEFT HEART CATH AND CORONARY ANGIOGRAPHY;  Surgeon: Burnell Blanks, MD;  Location: [place] [facility];  Service: Cardiovascular;  Laterality: N/A;   LEFT HEART CATHETERIZATION WITH CORONARY ANGIOGRAM N/A 12/12/2011   Procedure: LEFT HEART CATHETERIZATION WITH CORONARY ANGIOGRAM;  Surgeon: Burnell Blanks, MD;  Location: [facility] C[facility] LAB;  Service: Cardiovascular;  Laterality: N/A;   LOWER EXTREMITY ANGIOGRAM N/A 01/31/2012   Procedure: LOWER EXTREMITY ANGIOGRAM;  Surgeon: Burnell Blanks, MD;  Location: [facility] C[facility] LAB;  Service: Cardiovascular;  Laterality: N/A;   LOWER EXTREMITY ANGIOGRAPHY N/A 04/23/2018   Procedure: LOWER EXTREMITY ANGIOGRAPHY;  Surgeon: Serafina Mitchell, MD;  Location: [place] [facility];  Service: Cardiovascular;  Laterality: N/A;   PERCUTANEOUS CORONARY STENT INTERVENTION (PCI-S) Right 12/12/2011   Procedure: PERCUTANEOUS CORONARY STENT  INTERVENTION (PCI-S);  Surgeon: Burnell Blanks, MD;  Location: [facility] CATH LAB;  Service: Cardiovascular;  Laterality: Right;   PERIPHERAL VASCULAR BALLOON ANGIOPLASTY Right 06/28/2021   Procedure: PERIPHERAL VASCULAR BALLOON ANGIOPLASTY;  Surgeon: Serafina Mitchell, MD;  Location: [place] [facility];  Service: Cardiovascular;  Laterality: Right;  common femoral (graft)   PERIPHERAL VASCULAR BALLOON ANGIOPLASTY  01/02/2023   Procedure: PERIPHERAL VASCULAR BALLOON ANGIOPLASTY;  Surgeon: Serafina Mitchell, MD;  Location: [place] [facility];  Service: Cardiovascular;;   PERIPHERAL VASCULAR INTERVENTION Left 04/23/2018   Procedure: PERIPHERAL VASCULAR INTERVENTION;  Surgeon: Serafina Mitchell, MD;  Location: [place] [facility];  Service: Cardiovascular;  Laterality: Left;  superficial femoral   REPAIR EXTENSOR TENDON Right 11/21/2014   Procedure: WITH REPAIR/RECONSTRUCTION OF EXTENSOR TENDONS AS NEEDED;  Surgeon: Roseanne Kaufman, MD;  Location: [place];  Service: Orthopedics;  Laterality: Right;   TEE WITHOUT CARDIOVERSION N/A 05/05/2020   Procedure: TRANSESOPHAGEAL ECHOCARDIOGRAM (TEE);  Surgeon: Grace Isaac, MD;  Location: [place];  Service: Open Heart Surgery;  Laterality: N/A;    MEDICATIONS:  albuterol (VENTOLIN HFA) 108 (90 Base) MCG/ACT inhaler   aspirin EC 81 MG tablet   atorvastatin (LIPITOR) 80 MG tablet   carvedilol (COREG) 25 MG tablet   cetirizine (ZYRTEC) 10 MG tablet   cyclobenzaprine (FLEXERIL) 10 MG tablet   diazepam (VALIUM) 10 MG tablet   ELIQUIS 5 MG TABS tablet   ENTRESTO 24-26 MG   FARXIGA 10 MG TABS tablet   furosemide (LASIX) 40 MG tablet   gabapentin (NEURONTIN) 300 MG capsule   insulin glargine (LANTUS) 100 UNIT/ML Solostar Pen   insulin lispro (HUMALOG) 100 UNIT/ML injection   naloxone (NARCAN) nasal spray 4 mg/0.1 mL   nitroGLYCERIN (NITROSTAT) 0.4 MG SL tablet   oxyCODONE-acetaminophen (PERCOCET) 10-325 MG tablet   pantoprazole (PROTONIX) 40 MG tablet    rosuvastatin (CRESTOR) 20 MG tablet   spironolactone (ALDACTONE) 25 MG tablet   TRINTELLIX 20 MG TABS tablet   No current facility-administered medications for this encounter.    Myra Gianotti, PA-C Surgical Short Stay/Anesthesiology [facility] Phone [phone] [facility] Phone [phone] 01/23/2023 5:43 PM

## 2023-01-24 ENCOUNTER — Ambulatory Visit: Payer: Medicaid Other | Attending: Nurse Practitioner | Admitting: Nurse Practitioner

## 2023-01-24 ENCOUNTER — Encounter: Payer: Self-pay | Admitting: Nurse Practitioner

## 2023-01-24 ENCOUNTER — Telehealth: Payer: Self-pay | Admitting: *Deleted

## 2023-01-24 DIAGNOSIS — Z01810 Encounter for preprocedural cardiovascular examination: Secondary | ICD-10-CM | POA: Diagnosis not present

## 2023-01-24 LAB — TYPE AND SCREEN
ABO/RH(D): O POS
Antibody Screen: NEGATIVE

## 2023-01-24 NOTE — Telephone Encounter (Signed)
Per the pre op APP I scheduled the pt today at 3:30 for pre op clearance due to med hold and procedure date. Pt tells me though that he took his last dose of Eliquis on 01/22/23. Pt states that surgeon also told him to stay on ASA. Pt thanked me for the help. Med rec and consent are done.      Patient Consent for Virtual Visit        Ryan [name] has provided verbal consent on 01/24/2023 for a virtual visit (video or telephone).   CONSENT FOR VIRTUAL VISIT FOR:  Ryan [name]  By participating in this virtual visit I agree to the following:  I hereby voluntarily request, consent and authorize [facility] and its employed or contracted physicians, physician assistants, nurse practitioners or other licensed health care professionals (the Practitioner), to provide me with telemedicine health care services (the "Services") as deemed necessary by the treating Practitioner. I acknowledge and consent to receive the Services by the Practitioner via telemedicine. I understand that the telemedicine visit will involve communicating with the Practitioner through live audiovisual communication technology and the disclosure of certain medical information by electronic transmission. I acknowledge that I have been given the opportunity to request an in-person assessment or other available alternative prior to the telemedicine visit and am voluntarily participating in the telemedicine visit.  I understand that I have the right to withhold or withdraw my consent to the use of telemedicine in the course of my care at any time, without affecting my right to future care or treatment, and that the Practitioner or I may terminate the telemedicine visit at any time. I understand that I have the right to inspect all information obtained and/or recorded in the course of the telemedicine visit and may receive copies of available information for a reasonable fee.  I understand that some of the potential risks of  receiving the Services via telemedicine include:  Delay or interruption in medical evaluation due to technological equipment failure or disruption; Information transmitted may not be sufficient (e.g. poor resolution of images) to allow for appropriate medical decision making by the Practitioner; and/or  In rare instances, security protocols could fail, causing a breach of personal health information.  Furthermore, I acknowledge that it is my responsibility to provide information about my medical history, conditions and care that is complete and accurate to the best of my ability. I acknowledge that Practitioner's advice, recommendations, and/or decision may be based on factors not within their control, such as incomplete or inaccurate data provided by me or distortions of diagnostic images or specimens that may result from electronic transmissions. I understand that the practice of medicine is not an exact science and that Practitioner makes no warranties or guarantees regarding treatment outcomes. I acknowledge that a copy of this consent can be made available to me via my patient portal ([facility]), or I can request a printed copy by calling the office of [facility].    I understand that my insurance will be billed for this visit.   I have read or had this consent read to me. I understand the contents of this consent, which adequately explains the benefits and risks of the Services being provided via telemedicine.  I have been provided ample opportunity to ask questions regarding this consent and the Services and have had my questions answered to my satisfaction. I give my informed consent for the services to be provided through the use of telemedicine in my  medical care

## 2023-01-24 NOTE — Anesthesia Preprocedure Evaluation (Addendum)
Anesthesia Evaluation  Patient identified by MRN, date of birth, ID band Patient awake    Reviewed: Allergy & Precautions, H&P , NPO status , Patient's Chart, lab work & pertinent test results  Airway Mallampati: II   Neck ROM: full    Dental   Pulmonary COPD, Current Smoker and Patient abstained from smoking.   breath sounds clear to auscultation       Cardiovascular hypertension, + CAD, + Past MI, + CABG, + Peripheral Vascular Disease and +CHF   Rhythm:regular Rate:Normal  TTE (07/2022): EF 45-50%  H/o aortobifem   Neuro/Psych    GI/Hepatic ,GERD  ,,  Endo/Other  diabetes, Type 2    Renal/GU      Musculoskeletal  (+) Arthritis ,    Abdominal   Peds  Hematology   Anesthesia Other Findings   Reproductive/Obstetrics                             Anesthesia Physical Anesthesia Plan  ASA: 3  Anesthesia Plan: General   Post-op Pain Management:    Induction: Intravenous  PONV Risk Score and Plan: 1 and Ondansetron, Dexamethasone, Midazolam and Treatment may vary due to age or medical condition  Airway Management Planned: Oral ETT  Additional Equipment: Arterial line  Intra-op Plan:   Post-operative Plan: Extubation in OR  Informed Consent: I have reviewed the patients History and Physical, chart, labs and discussed the procedure including the risks, benefits and alternatives for the proposed anesthesia with the patient or authorized representative who has indicated his/her understanding and acceptance.     Dental advisory given  Plan Discussed with: CRNA, Anesthesiologist and Surgeon  Anesthesia Plan Comments: (PAT note written by Myra Gianotti, PA-C.  )       Anesthesia Quick Evaluation

## 2023-01-24 NOTE — Telephone Encounter (Signed)
Per the pre op APP I scheduled the pt today at 3:30 for pre op clearance due to med hold and procedure date. Pt tells me though that he took his last dose of Eliquis on 01/22/23. Pt states that surgeon also told him to stay on ASA. Pt thanked me for the help. Med rec and consent are done.       Patient Consent for Virtual Visit        Ryan [name] has provided verbal consent on 01/24/2023 for a virtual visit (video or telephone).   CONSENT FOR VIRTUAL VISIT FOR:  Ryan [name]  By participating in this virtual visit I agree to the following:  I hereby voluntarily request, consent and authorize [employer] and its employed or contracted physicians, physician assistants, nurse practitioners or other licensed health care professionals (the Practitioner), to provide me with telemedicine health care services (the "Services") as deemed necessary by the treating Practitioner. I acknowledge and consent to receive the Services by the Practitioner via telemedicine. I understand that the telemedicine visit will involve communicating with the Practitioner through live audiovisual communication technology and the disclosure of certain medical information by electronic transmission. I acknowledge that I have been given the opportunity to request an in-person assessment or other available alternative prior to the telemedicine visit and am voluntarily participating in the telemedicine visit.  I understand that I have the right to withhold or withdraw my consent to the use of telemedicine in the course of my care at any time, without affecting my right to future care or treatment, and that the Practitioner or I may terminate the telemedicine visit at any time. I understand that I have the right to inspect all information obtained and/or recorded in the course of the telemedicine visit and may receive copies of available information for a reasonable fee.  I understand that some of the potential risks of  receiving the Services via telemedicine include:  Delay or interruption in medical evaluation due to technological equipment failure or disruption; Information transmitted may not be sufficient (e.g. poor resolution of images) to allow for appropriate medical decision making by the Practitioner; and/or  In rare instances, security protocols could fail, causing a breach of personal health information.  Furthermore, I acknowledge that it is my responsibility to provide information about my medical history, conditions and care that is complete and accurate to the best of my ability. I acknowledge that Practitioner's advice, recommendations, and/or decision may be based on factors not within their control, such as incomplete or inaccurate data provided by me or distortions of diagnostic images or specimens that may result from electronic transmissions. I understand that the practice of medicine is not an exact science and that Practitioner makes no warranties or guarantees regarding treatment outcomes. I acknowledge that a copy of this consent can be made available to me via my patient portal ([facility]), or I can request a printed copy by calling the office of [facility].    I understand that my insurance will be billed for this visit.   I have read or had this consent read to me. I understand the contents of this consent, which adequately explains the benefits and risks of the Services being provided via telemedicine.  I have been provided ample opportunity to ask questions regarding this consent and the Services and have had my questions answered to my satisfaction. I give my informed consent for the services to be provided through the use of telemedicine in  my medical care

## 2023-01-24 NOTE — Telephone Encounter (Signed)
Patient with diagnosis of afib on Eliquis for anticoagulation.    Procedure: REDO LEFT FEMORAL ENDARTERECTOMY WITH PATCH ANGIOPLASTY  Date of procedure: 01/26/23   CHA2DS2-VASc Score = 4   This indicates a 4.8% annual risk of stroke. The patient's score is based upon: CHF History: 1 HTN History: 1 Diabetes History: 1 Stroke History: 0 Vascular Disease History: 1 Age Score: 0 Gender Score: 0      CrCl 72 ml/min  Per office protocol, patient can hold Eliquis for 3 days prior to procedure.    **This guidance is not considered finalized until pre-operative APP has relayed final recommendations.**

## 2023-01-24 NOTE — Progress Notes (Signed)
Virtual Visit via Telephone Note   Because of Ryan [name]'s co-morbid illnesses, he is at least at moderate risk for complications without adequate follow up.  This format is felt to be most appropriate for this patient at this time.  The patient did not have access to video technology/had technical difficulties with video requiring transitioning to audio format only (telephone).  All issues noted in this document were discussed and addressed.  No physical exam could be performed with this format.  Please refer to the patient's chart for his consent to telehealth for [facility].  Evaluation Performed:  Preoperative cardiovascular risk assessment _____________   Date:  01/24/2023   Patient ID:  Ryan [name], DOB 11/06/1966, MRN [id number] Patient Location:  Home Provider location:   Office  Primary Care Provider:  Nolene Ebbs, MD Primary Cardiologist:  Ryan Chandler, MD  Chief Complaint / Patient Profile   57 y.o. y/o male with a h/o CAD, tobacco abuse, HLD, medical noncompliance, IDDM, PAD s/p aorto by femoral bypass February 2014, left carotid to subclavian bypass May 2014, right femoral to AK popliteal bypass with saphenous vein and right femoral endarterectomy April 2019, drug-coated balloon angioplasty of right common femoral artery 06/2021, and ICM. Hx of CAD with anterior STEMI January 2013 with cardiac arrest and found to have occluded mid LAD treated with DES, third OM branch chronically occluded and RCA had mild disease, post MI LVEF was 35% but this normalized on follow-up echo. Cardiac cath November 2017 with severe stent restenosis in the LAD treated with Cutting Balloon angioplasty, admitted to [facility] August 2018 with anterior STEMI, LAD was occluded proximal to the old stent and this was treated with DES, angioplasty performed on ostial diagonal branch, LVEF was 40% following the MI. Unstable angina June 2020 with repeat cath showed severe  restenosis in mid LAD stented segment which was treated with Cutting Balloon angioplasty, also had ostial stenosis of diagonal branch (jailed by LAD stents), mild to moderate nonobstructive disease in the mid RCA, and moderate LV systolic dysfunction with anteroapical hypokinesis.  Cardiac cath 04/2020 with progression of disease in the LAD and RCA, he underwent 3V CABG 05/2020, with postop course complicated by atrial fibrillation.  Echo with LVEF 30 to 35% with apical thrombus, discharged on Eliquis and LifeVest. Cardiac cath April 2023 with severe disease in SVG to RCA treated with DES. Admission to [facility] 03/30/2022 with syncope x 3 and Imdur was stopped. Echo 03/30/2022 with LVEF 60 to 65% with apical akinesis, no LV thrombus. He is now pending redo left femoral endarterectomy with patch angioplasty and presents today for telephonic preoperative cardiovascular risk assessment.  History of Present Illness    Ryan [name] is a 57 y.o. male who presents via audio/video conferencing for a telehealth visit today.  Pt was last seen in cardiology clinic on 12/20/2022 by Ryan Grinder, NP. At that time Ryan [name] was doing well with NYHA II symptoms of CHF/ICM.  The patient is now pending procedure as outlined above. Since his last visit, he  denies chest pain, shortness of breath, lower extremity edema, fatigue, palpitations, melena, hematuria, hemoptysis, diaphoresis, weakness, presyncope, syncope, orthopnea, and PND.  Past Medical History    Past Medical History:  Diagnosis Date   Acute myocardial infarction of other anterior wall, subsequent episode of care 12/12/2011   Anal fissure    Arthritis    back   Atherosclerosis of native artery of both lower extremities with intermittent claudication ([place]) 04/01/2018  Bruises easily    d/t being on Effient   CAD (coronary artery disease)    A. Acute Ant STEMI 12/12/2011    Cardiomyopathy secondary [id number]   Chronic systolic CHF (congestive heart failure) ([place])     ischemic CM // Echo 6/21: EF 30-35  //  Echocardiogram 10/21: LV apical thrombus resolved, EF 50-55, ant and apical, ant-lat HK, trivial MR, trivial AI   Chronic total occlusion of artery of the extremities ([place]) 02/26/2012   Claudication ([place]) 12/26/2011   Coronary atherosclerosis of native coronary artery    a. ant STEMI with cardiac arrest 2013 s/p DES to mLAD. b. [place] 10/2016 s/p PTCA to mLAD.   Diabetes mellitus without complication ([place])    Essential hypertension 10/23/2016   GERD (gastroesophageal reflux disease)    "takes tums"   History of blood transfusion    no abnormal reaction noted   History of kidney stones    Hypertension    Ischemic cardiomyopathy    a. EF 35% in 02/2012 at time of acute MI, improved to normal on subsequent imaging.   MI (myocardial infarction) ([place])    AMI [id number] - complicated by VT/Tosades   Mixed hyperlipidemia    Numbness and tingling of right arm 03/31/2013   Occlusion and stenosis of carotid artery without mention of cerebral infarction 08/26/2012   PAD (peripheral artery disease) ([place])    followed by Vascular - aortobifem bypass 01/2013 & left carotid-subclavian artery bypass 04/2013   Peripheral vascular disease, unspecified ([place]) 12/16/2012   PVD (peripheral vascular disease) ([place]) 03/31/2013   Rectal polyp    Subclavian steal syndrome 08/11/2013   Uncontrolled diabetes mellitus 10/23/2016   Wears partial dentures    upper   Past Surgical History:  Procedure Laterality Date   ABDOMINAL AORTOGRAM W/LOWER EXTREMITY N/A 06/26/2017   Procedure: Abdominal Aortogram w/Lower Extremity;  Surgeon: Ryan Mitchell, MD;  Location: [place] [facility];  Service: Cardiovascular;  Laterality: N/A;   ABDOMINAL AORTOGRAM W/LOWER EXTREMITY N/A 06/28/2021   Procedure: ABDOMINAL AORTOGRAM W/LOWER EXTREMITY;  Surgeon: Ryan Mitchell, MD;  Location: [place] [facility];  Service: Cardiovascular;  Laterality: N/A;   ABDOMINAL AORTOGRAM W/LOWER EXTREMITY N/A 01/02/2023    Procedure: ABDOMINAL AORTOGRAM W/LOWER EXTREMITY;  Surgeon: Ryan Mitchell, MD;  Location: [place] [facility];  Service: Cardiovascular;  Laterality: N/A;   ANAL FISSURECTOMY     AORTA - BILATERAL FEMORAL ARTERY BYPASS GRAFT N/A 01/16/2013   Procedure: AORTA BIFEMORAL BYPASS GRAFT;  Surgeon: Ryan Mitchell, MD;  Location: [place];  Service: Vascular;  Laterality: N/A;   APPENDECTOMY     CARDIAC CATHETERIZATION N/A 10/23/2016   Procedure: Left Heart Cath and Coronary Angiography;  Surgeon: Burnell Blanks, MD;  Location: [place] [facility];  Service: Cardiovascular;  Laterality: N/A;   CARDIAC CATHETERIZATION N/A 10/23/2016   Procedure: Coronary Balloon Angioplasty;  Surgeon: Burnell Blanks, MD;  Location: [place] [facility];  Service: Cardiovascular;  Laterality: N/A;   CAROTID-SUBCLAVIAN BYPASS GRAFT Left 04/03/2013   Procedure: BYPASS GRAFT CAROTID-SUBCLAVIAN;  Surgeon: Ryan Mitchell, MD;  Location: [place];  Service: Vascular;  Laterality: Left;   CIRCUMCISION N/A 12/15/2015   Procedure: CIRCUMCISION ADULT;  Surgeon: Cleon Gustin, MD;  Location: [facility];  Service: Urology;  Laterality: N/A;   COLONOSCOPY     CORONARY ANGIOPLASTY     stent placed Dec 12, 2011 and 2018   CORONARY ARTERY BYPASS GRAFT N/A 05/05/2020   Procedure: CORONARY ARTERY BYPASS  GRAFTING (CABG) x Three, Using Left internal Mammary Artery and Left Leg greater saphenous vein harvested endoscopically;  Surgeon: Grace Isaac, MD;  Location: [place];  Service: Open Heart Surgery;  Laterality: N/A;   CORONARY BALLOON ANGIOPLASTY N/A 05/23/2019   Procedure: CORONARY BALLOON ANGIOPLASTY;  Surgeon: Burnell Blanks, MD;  Location: [place] [facility];  Service: Cardiovascular;  Laterality: N/A;   DIAGNOSTIC LAPAROSCOPY     ENDARTERECTOMY FEMORAL Right 03/13/2018   Procedure: FEMORAL ENDARTERECTOMY RIGHT;  Surgeon: Ryan Mitchell, MD;  Location: [facility];  Service: Vascular;  Laterality: Right;    FEMORAL-POPLITEAL BYPASS GRAFT Right 03/13/2018   Procedure: FEMORAL-POPLITEAL ARTERY BYPASS WITH NON-REVERSE VEIN RIGHT;  Surgeon: Ryan Mitchell, MD;  Location: [facility];  Service: Vascular;  Laterality: Right;   GROIN DISSECTION Right 03/13/2018   Procedure: RE-DO COMMON FEMORAL ARTERY EXPOSURE;  Surgeon: Ryan Mitchell, MD;  Location: [facility];  Service: Vascular;  Laterality: Right;   I & D EXTREMITY Left 04/18/2013   Procedure: debridement of left neck lymphocele;  Surgeon: Ryan Mitchell, MD;  Location: [place];  Service: Vascular;  Laterality: Left;  I and D of left neck   I & D EXTREMITY Right 11/21/2014   Procedure: IRRIGATION AND DEBRIDEMENT RIGHT HAND;  Surgeon: Roseanne Kaufman, MD;  Location: [place];  Service: Orthopedics;  Laterality: Right;   INTRAVASCULAR PRESSURE WIRE/FFR STUDY N/A 04/22/2020   Procedure: INTRAVASCULAR PRESSURE WIRE/FFR STUDY;  Surgeon: Burnell Blanks, MD;  Location: [facility];  Service: Cardiovascular;  Laterality: N/A;   LEFT HEART CATH AND CORONARY ANGIOGRAPHY N/A 05/23/2019   Procedure: LEFT HEART CATH AND CORONARY ANGIOGRAPHY;  Surgeon: Burnell Blanks, MD;  Location: [place] [facility];  Service: Cardiovascular;  Laterality: N/A;   LEFT HEART CATH AND CORONARY ANGIOGRAPHY N/A 04/22/2020   Procedure: LEFT HEART CATH AND CORONARY ANGIOGRAPHY;  Surgeon: Burnell Blanks, MD;  Location: [place] [facility];  Service: Cardiovascular;  Laterality: N/A;   LEFT HEART CATHETERIZATION WITH CORONARY ANGIOGRAM N/A 12/12/2011   Procedure: LEFT HEART CATHETERIZATION WITH CORONARY ANGIOGRAM;  Surgeon: Burnell Blanks, MD;  Location: [facility] CATH LAB;  Service: Cardiovascular;  Laterality: N/A;   LOWER EXTREMITY ANGIOGRAM N/A 01/31/2012   Procedure: LOWER EXTREMITY ANGIOGRAM;  Surgeon: Burnell Blanks, MD;  Location: [facility] C[facility] LAB;  Service: Cardiovascular;  Laterality: N/A;   LOWER EXTREMITY ANGIOGRAPHY N/A 04/23/2018   Procedure: LOWER EXTREMITY  ANGIOGRAPHY;  Surgeon: Ryan Mitchell, MD;  Location: [place] [facility];  Service: Cardiovascular;  Laterality: N/A;   PERCUTANEOUS CORONARY STENT INTERVENTION (PCI-S) Right 12/12/2011   Procedure: PERCUTANEOUS CORONARY STENT INTERVENTION (PCI-S);  Surgeon: Burnell Blanks, MD;  Location: [facility] CATH LAB;  Service: Cardiovascular;  Laterality: Right;   PERIPHERAL VASCULAR BALLOON ANGIOPLASTY Right 06/28/2021   Procedure: PERIPHERAL VASCULAR BALLOON ANGIOPLASTY;  Surgeon: Ryan Mitchell, MD;  Location: [place] [facility];  Service: Cardiovascular;  Laterality: Right;  common femoral (graft)   PERIPHERAL VASCULAR BALLOON ANGIOPLASTY  01/02/2023   Procedure: PERIPHERAL VASCULAR BALLOON ANGIOPLASTY;  Surgeon: Ryan Mitchell, MD;  Location: [place] [facility];  Service: Cardiovascular;;   PERIPHERAL VASCULAR INTERVENTION Left 04/23/2018   Procedure: PERIPHERAL VASCULAR INTERVENTION;  Surgeon: Ryan Mitchell, MD;  Location: [place] [facility];  Service: Cardiovascular;  Laterality: Left;  superficial femoral   REPAIR EXTENSOR TENDON Right 11/21/2014   Procedure: WITH REPAIR/RECONSTRUCTION OF EXTENSOR TENDONS AS NEEDED;  Surgeon: Roseanne Kaufman, MD;  Location: [place];  Service: Orthopedics;  Laterality: Right;   TEE WITHOUT CARDIOVERSION N/A 05/05/2020   Procedure: TRANSESOPHAGEAL ECHOCARDIOGRAM (TEE);  Surgeon: Grace Isaac, MD;  Location: [place];  Service: Open Heart Surgery;  Laterality: N/A;    Allergies  Allergies  Allergen Reactions   Hydrocodone Hives and Nausea And Vomiting   Metformin     Water blisters between fingers    Home Medications    Prior to Admission medications   Medication Sig Start Date End Date Taking? Authorizing Provider  albuterol (VENTOLIN HFA) 108 (90 Base) MCG/ACT inhaler Inhale 1-2 puffs into the lungs every 6 (six) hours as needed for wheezing or shortness of breath.    [provider]  aspirin EC 81 MG tablet Take 1 tablet (81 mg total) by mouth  daily. 10/23/16   Dunn, Nedra Hai, PA-C  atorvastatin (LIPITOR) 80 MG tablet Take 80 mg by mouth daily at 6 PM. 12/29/22   [provider]  carvedilol (COREG) 25 MG tablet Take 1 tablet (25 mg total) by mouth 2 (two) times daily. 02/22/22   Early Osmond, MD  cetirizine (ZYRTEC) 10 MG tablet Take 10 mg by mouth daily as needed for allergies. 06/02/21   [provider]  cyclobenzaprine (FLEXERIL) 10 MG tablet Take 1 tablet (10 mg total) by mouth 2 (two) times daily as needed for muscle spasms. 05/21/19   Lyda Jester M, PA-C  diazepam (VALIUM) 10 MG tablet Take 10 mg by mouth at bedtime. 03/10/21   [provider]  ELIQUIS 5 MG TABS tablet TAKE 1 TABLET BY MOUTH 2 TIMES A DAY 08/30/22   Burnell Blanks, MD  ENTRESTO 24-26 MG TAKE 1 TABLET BY MOUTH TWICE DAILY 12/29/22   Clegg, [name], NP  FARXIGA 10 MG TABS tablet Take 10 mg by mouth daily. 04/05/21   [provider]  furosemide (LASIX) 40 MG tablet TAKE 1 TABLET BY MOUTH EACH DAY Patient taking differently: Take 40 mg by mouth 2 (two) times daily. 04/27/22   Burnell Blanks, MD  gabapentin (NEURONTIN) 300 MG capsule Take 300 mg by mouth 3 (three) times daily. 03/25/20   [provider]  insulin glargine (LANTUS) 100 UNIT/ML Solostar Pen Inject 50 Units into the skin 2 (two) times daily.    [provider]  insulin lispro (HUMALOG) 100 UNIT/ML injection Inject 5-15 Units into the skin 3 (three) times daily as needed (blood sugar over 150).    [provider]  naloxone [facility]) nasal spray 4 mg/0.1 mL Place 4 mg into the nose once as needed (overdose). 03/31/21   [provider]  nitroGLYCERIN (NITROSTAT) 0.4 MG SL tablet Place 0.4 mg under the tongue every 5 (five) minutes as needed for chest pain.    [provider]  oxyCODONE-acetaminophen (PERCOCET) 10-325 MG tablet Take 1 tablet by mouth 4 (four) times daily.    [provider]  pantoprazole  (PROTONIX) 40 MG tablet Take 40 mg by mouth daily.    [provider]  rosuvastatin (CRESTOR) 20 MG tablet Take 2 tablets (40 mg total) by mouth daily. Patient not taking: Reported on 01/19/2023 03/31/22   Margie Billet, PA-C  spironolactone (ALDACTONE) 25 MG tablet Take 25 mg by mouth daily. 12/29/22   [provider]  TRINTELLIX 20 MG TABS tablet Take 20 mg by mouth daily in the afternoon. 12/29/22   [provider]    Physical Exam    Vital Signs:  Ryan [name] does not have vital  signs available for review today.  Given telephonic nature of communication, physical exam is limited. AAOx3. NAD. Normal affect.  Speech and respirations are unlabored.  Accessory Clinical Findings    None  Assessment & Plan    1.  Preoperative Cardiovascular Risk Assessment:The patient is doing well from a cardiac perspective. Therefore, based on ACC/AHA guidelines, the patient would be at acceptable risk for the planned procedure without further cardiovascular testing. According to the Revised Cardiac Risk Index (RCRI), his Perioperative Risk of Major Cardiac Event is (%): 11. His Functional Capacity in METs is: 6.91 according to the [employer] Activity Status Index (DASI).  The patient was advised that if he develops new symptoms prior to surgery to contact our office to arrange for a follow-up visit, and he verbalized understanding.   Per office protocol, patient can hold Eliquis for 3 days prior to procedure.     A copy of this note will be routed to requesting surgeon.  Time:   Today, I have spent 10 minutes with the patient with telehealth technology discussing medical history, symptoms, and management plan.     Emmaline Life, NP-C  01/24/2023, 12:53 PM [address], [address] Office [phone] Fax [phone]

## 2023-01-24 NOTE — Telephone Encounter (Signed)
   Pre-operative Risk Assessment    Patient Name: Ryan [name]  DOB: June 29, 1966 MRN: [id number]    CLEARANCE REQUEST WAS JUST RECEIVED THIS MORNING; NOTES STATE JUST RECEIVED NOTIFICATION FROM THE PRE ADMISSION ANESTHESIA DEPT, THAT PT NEEDS CARDIAC CLEARANCE. I APOLOGIZE FOR THE LATE REQUEST.   Request for Surgical Clearance    Procedure:   REDO LEFT FEMORAL ENDARTERECTOMY  WITH PATCH ANGIOPLASTY   Date of Surgery:  Clearance 01/26/23 URGENT                                Surgeon:  DR. Annamarie Major Surgeon's Group or Practice Name:  [facility] Phone number:  [phone] Fax number:  [phone]   Type of Clearance Requested:   - Medical  - Pharmacy:  Hold Apixaban (Eliquis) x 3 DAYS PRIOR   Type of Anesthesia:  General    Additional requests/questions:    Jiles Prows   01/24/2023, 10:56 AM

## 2023-01-26 ENCOUNTER — Inpatient Hospital Stay (HOSPITAL_COMMUNITY): Payer: Medicaid Other | Admitting: Anesthesiology

## 2023-01-26 ENCOUNTER — Inpatient Hospital Stay (HOSPITAL_COMMUNITY)
Admission: RE | Admit: 2023-01-26 | Discharge: 2023-01-27 | DRG: 254 | Disposition: A | Discharge status: Home / Self Care | Payer: Medicaid Other | Attending: Surgery | Admitting: Surgery

## 2023-01-26 ENCOUNTER — Encounter (HOSPITAL_COMMUNITY): Payer: Self-pay | Admitting: Surgery

## 2023-01-26 ENCOUNTER — Other Ambulatory Visit: Payer: Self-pay

## 2023-01-26 ENCOUNTER — Encounter (HOSPITAL_COMMUNITY)
Admission: RE | Disposition: A | Discharge status: Home / Self Care | Payer: Self-pay | Source: Home / Self Care | Attending: Surgery

## 2023-01-26 ENCOUNTER — Inpatient Hospital Stay (HOSPITAL_COMMUNITY): Payer: Medicaid Other | Admitting: Physician Assistant

## 2023-01-26 DIAGNOSIS — Z7902 Long term (current) use of antithrombotics/antiplatelets: Secondary | ICD-10-CM | POA: Diagnosis not present

## 2023-01-26 DIAGNOSIS — Z888 Allergy status to other drugs, medicaments and biological substances status: Secondary | ICD-10-CM | POA: Diagnosis not present

## 2023-01-26 DIAGNOSIS — Z885 Allergy status to narcotic agent status: Secondary | ICD-10-CM | POA: Diagnosis not present

## 2023-01-26 DIAGNOSIS — Z79899 Other long term (current) drug therapy: Secondary | ICD-10-CM

## 2023-01-26 DIAGNOSIS — I2510 Atherosclerotic heart disease of native coronary artery without angina pectoris: Secondary | ICD-10-CM

## 2023-01-26 DIAGNOSIS — Z951 Presence of aortocoronary bypass graft: Secondary | ICD-10-CM | POA: Diagnosis not present

## 2023-01-26 DIAGNOSIS — F17210 Nicotine dependence, cigarettes, uncomplicated: Secondary | ICD-10-CM | POA: Diagnosis not present

## 2023-01-26 DIAGNOSIS — I252 Old myocardial infarction: Secondary | ICD-10-CM

## 2023-01-26 DIAGNOSIS — Z95828 Presence of other vascular implants and grafts: Secondary | ICD-10-CM | POA: Diagnosis not present

## 2023-01-26 DIAGNOSIS — Z7982 Long term (current) use of aspirin: Secondary | ICD-10-CM | POA: Diagnosis not present

## 2023-01-26 DIAGNOSIS — E1151 Type 2 diabetes mellitus with diabetic peripheral angiopathy without gangrene: Secondary | ICD-10-CM

## 2023-01-26 DIAGNOSIS — Z7901 Long term (current) use of anticoagulants: Secondary | ICD-10-CM | POA: Diagnosis not present

## 2023-01-26 DIAGNOSIS — Z794 Long term (current) use of insulin: Secondary | ICD-10-CM | POA: Diagnosis not present

## 2023-01-26 DIAGNOSIS — I739 Peripheral vascular disease, unspecified: Principal | ICD-10-CM | POA: Diagnosis present

## 2023-01-26 DIAGNOSIS — Z01818 Encounter for other preprocedural examination: Secondary | ICD-10-CM

## 2023-01-26 DIAGNOSIS — I70212 Atherosclerosis of native arteries of extremities with intermittent claudication, left leg: Secondary | ICD-10-CM | POA: Diagnosis not present

## 2023-01-26 DIAGNOSIS — Z7984 Long term (current) use of oral hypoglycemic drugs: Secondary | ICD-10-CM | POA: Diagnosis not present

## 2023-01-26 DIAGNOSIS — I110 Hypertensive heart disease with heart failure: Secondary | ICD-10-CM | POA: Diagnosis not present

## 2023-01-26 HISTORY — PX: ENDARTERECTOMY FEMORAL: SHX5804

## 2023-01-26 HISTORY — PX: PATCH ANGIOPLASTY: SHX6230

## 2023-01-26 LAB — CREATININE, SERUM
Creatinine, Ser: 1.2 mg/dL (ref 0.61–1.24)
GFR, Estimated: >60 mL/min (ref >60)

## 2023-01-26 LAB — GLUCOSE, CAPILLARY
Glucose-Capillary: 92 mg/dL (ref 70–99)
Glucose-Capillary: 101 mg/dL — ABNORMAL HIGH (ref 70–99)
Glucose-Capillary: 179 mg/dL — ABNORMAL HIGH (ref 70–99)
Glucose-Capillary: 411 mg/dL — ABNORMAL HIGH (ref 70–99)

## 2023-01-26 LAB — CBC
HCT: 43.2 % (ref 39.0–52.0)
Hemoglobin: 14.9 g/dL (ref 13.0–17.0)
MCH: 32.7 pg (ref 26.0–34.0)
MCHC: 34.5 g/dL (ref 30.0–36.0)
MCV: 94.7 fL (ref 80.0–100.0)
Platelets: 151 10*3/uL (ref 150–400)
RBC: 4.56 MIL/uL (ref 4.22–5.81)
RDW: 14.5 % (ref 11.5–15.5)
WBC: 8.9 10*3/uL (ref 4.0–10.5)
nRBC: 0 % (ref 0.0–0.2)

## 2023-01-26 SURGERY — ENDARTERECTOMY, FEMORAL
Anesthesia: General | Site: Groin | Laterality: Left

## 2023-01-26 MED ORDER — GUAIFENESIN-DM 100-10 MG/5ML PO SYRP: 15.0000 mL | ORAL_SOLUTION | ORAL | Status: DC | PRN

## 2023-01-26 MED ORDER — HEPARIN 6000 UNIT IRRIGATION SOLUTION
Status: DC | PRN
  Administered 2023-01-26: 1

## 2023-01-26 MED ORDER — ORAL CARE MOUTH RINSE: 15.0000 mL | Freq: Once | OROMUCOSAL | Status: AC

## 2023-01-26 MED ORDER — LABETALOL HCL 5 MG/ML IV SOLN: 10.0000 mg | INTRAVENOUS | Status: DC | PRN

## 2023-01-26 MED ORDER — ROCURONIUM BROMIDE 10 MG/ML (PF) SYRINGE
PREFILLED_SYRINGE | INTRAVENOUS | Status: DC | PRN
  Administered 2023-01-26: 60 mg via INTRAVENOUS
  Administered 2023-01-26: 40 mg via INTRAVENOUS

## 2023-01-26 MED ORDER — PHENOL 1.4 % MT LIQD: 1.0000 | OROMUCOSAL | Status: DC | PRN

## 2023-01-26 MED ORDER — CHLORHEXIDINE GLUCONATE CLOTH 2 % EX PADS: 6.0000 | MEDICATED_PAD | Freq: Once | CUTANEOUS | Status: DC

## 2023-01-26 MED ORDER — BISACODYL 5 MG PO TBEC: 5.0000 mg | DELAYED_RELEASE_TABLET | Freq: Every day | ORAL | Status: DC | PRN

## 2023-01-26 MED ORDER — HEPARIN 6000 UNIT IRRIGATION SOLUTION
Status: AC
  Filled 2023-01-26: qty 500

## 2023-01-26 MED ORDER — PHENYLEPHRINE 80 MCG/ML (10ML) SYRINGE FOR IV PUSH (FOR BLOOD PRESSURE SUPPORT)
PREFILLED_SYRINGE | INTRAVENOUS | Status: DC | PRN
  Administered 2023-01-26: 160 ug via INTRAVENOUS

## 2023-01-26 MED ORDER — OXYCODONE-ACETAMINOPHEN 5-325 MG PO TABS
1.0000 | ORAL_TABLET | ORAL | Status: DC | PRN
  Administered 2023-01-26 – 2023-01-27 (×3): 1 via ORAL
  Filled 2023-01-26 (×4): qty 1

## 2023-01-26 MED ORDER — SODIUM CHLORIDE 0.9 % IV SOLN: INTRAVENOUS | Status: DC

## 2023-01-26 MED ORDER — ASPIRIN 81 MG PO TBEC
81.0000 mg | DELAYED_RELEASE_TABLET | Freq: Every day | ORAL | Status: DC
  Administered 2023-01-27: 81 mg via ORAL
  Filled 2023-01-26 (×2): qty 1

## 2023-01-26 MED ORDER — ALUM & MAG HYDROXIDE-SIMETH 200-200-20 MG/5ML PO SUSP: 15.0000 mL | ORAL | Status: DC | PRN

## 2023-01-26 MED ORDER — APIXABAN 5 MG PO TABS: 5.0000 mg | ORAL_TABLET | Freq: Two times a day (BID) | ORAL | Status: DC

## 2023-01-26 MED ORDER — DEXAMETHASONE SODIUM PHOSPHATE 10 MG/ML IJ SOLN
INTRAMUSCULAR | Status: DC | PRN
  Administered 2023-01-26: 8 mg via INTRAVENOUS

## 2023-01-26 MED ORDER — ACETAMINOPHEN 325 MG PO TABS: 325.0000 mg | ORAL_TABLET | ORAL | Status: DC | PRN

## 2023-01-26 MED ORDER — CHLORHEXIDINE GLUCONATE 0.12 % MT SOLN
15.0000 mL | Freq: Once | OROMUCOSAL | Status: AC
  Administered 2023-01-26: 15 mL via OROMUCOSAL
  Filled 2023-01-26: qty 15

## 2023-01-26 MED ORDER — MIDAZOLAM HCL 2 MG/2ML IJ SOLN
INTRAMUSCULAR | Status: DC | PRN
  Administered 2023-01-26: 2 mg via INTRAVENOUS

## 2023-01-26 MED ORDER — FENTANYL CITRATE (PF) 250 MCG/5ML IJ SOLN
INTRAMUSCULAR | Status: DC | PRN
  Administered 2023-01-26: 150 ug via INTRAVENOUS
  Administered 2023-01-26: 50 ug via INTRAVENOUS

## 2023-01-26 MED ORDER — PHENYLEPHRINE 80 MCG/ML (10ML) SYRINGE FOR IV PUSH (FOR BLOOD PRESSURE SUPPORT): PREFILLED_SYRINGE | INTRAVENOUS | Status: DC | PRN

## 2023-01-26 MED ORDER — POTASSIUM CHLORIDE CRYS ER 20 MEQ PO TBCR: 20.0000 meq | EXTENDED_RELEASE_TABLET | Freq: Every day | ORAL | Status: DC | PRN

## 2023-01-26 MED ORDER — ONDANSETRON HCL 4 MG/2ML IJ SOLN: 4.0000 mg | Freq: Four times a day (QID) | INTRAMUSCULAR | Status: DC | PRN

## 2023-01-26 MED ORDER — OXYCODONE HCL 5 MG PO TABS
5.0000 mg | ORAL_TABLET | Freq: Once | ORAL | Status: AC | PRN
  Administered 2023-01-26: 5 mg via ORAL

## 2023-01-26 MED ORDER — HYDRALAZINE HCL 20 MG/ML IJ SOLN: 5.0000 mg | INTRAMUSCULAR | Status: DC | PRN

## 2023-01-26 MED ORDER — MIDAZOLAM HCL 2 MG/2ML IJ SOLN
INTRAMUSCULAR | Status: AC
  Filled 2023-01-26: qty 2

## 2023-01-26 MED ORDER — OXYCODONE HCL 5 MG PO TABS
5.0000 mg | ORAL_TABLET | Freq: Once | ORAL | Status: AC
  Administered 2023-01-26: 5 mg via ORAL

## 2023-01-26 MED ORDER — FENTANYL CITRATE (PF) 100 MCG/2ML IJ SOLN: 25.0000 ug | INTRAMUSCULAR | Status: DC | PRN

## 2023-01-26 MED ORDER — INSULIN GLARGINE-YFGN 100 UNIT/ML ~~LOC~~ SOLN
50.0000 [IU] | Freq: Two times a day (BID) | SUBCUTANEOUS | Status: DC
  Administered 2023-01-27 (×2): 50 [IU] via SUBCUTANEOUS
  Filled 2023-01-26 (×3): qty 0.5

## 2023-01-26 MED ORDER — APIXABAN 5 MG PO TABS
5.0000 mg | ORAL_TABLET | Freq: Two times a day (BID) | ORAL | Status: DC
  Administered 2023-01-27: 5 mg via ORAL
  Filled 2023-01-26 (×2): qty 1

## 2023-01-26 MED ORDER — SUGAMMADEX SODIUM 200 MG/2ML IV SOLN
INTRAVENOUS | Status: DC | PRN
  Administered 2023-01-26: 208.6 mg via INTRAVENOUS

## 2023-01-26 MED ORDER — FUROSEMIDE 40 MG PO TABS
40.0000 mg | ORAL_TABLET | Freq: Two times a day (BID) | ORAL | Status: DC
  Administered 2023-01-26 – 2023-01-27 (×2): 40 mg via ORAL
  Filled 2023-01-26 (×3): qty 1

## 2023-01-26 MED ORDER — PROTAMINE SULFATE 10 MG/ML IV SOLN
INTRAVENOUS | Status: DC | PRN
  Administered 2023-01-26: 50 mg via INTRAVENOUS

## 2023-01-26 MED ORDER — LIDOCAINE 2% (20 MG/ML) 5 ML SYRINGE
INTRAMUSCULAR | Status: DC | PRN
  Administered 2023-01-26: 60 mg via INTRAVENOUS

## 2023-01-26 MED ORDER — MAGNESIUM SULFATE 2 GM/50ML IV SOLN: 2.0000 g | Freq: Every day | INTRAVENOUS | Status: DC | PRN

## 2023-01-26 MED ORDER — VASOPRESSIN 20 UNIT/ML IV SOLN
INTRAVENOUS | Status: AC
  Filled 2023-01-26: qty 1

## 2023-01-26 MED ORDER — DAPAGLIFLOZIN PROPANEDIOL 10 MG PO TABS
10.0000 mg | ORAL_TABLET | Freq: Every day | ORAL | Status: DC
  Administered 2023-01-27: 10 mg via ORAL
  Filled 2023-01-26 (×3): qty 1

## 2023-01-26 MED ORDER — VASOPRESSIN 20 UNIT/ML IV SOLN
INTRAVENOUS | Status: DC | PRN
  Administered 2023-01-26 (×4): 1 [IU] via INTRAVENOUS

## 2023-01-26 MED ORDER — SACUBITRIL-VALSARTAN 24-26 MG PO TABS
1.0000 | ORAL_TABLET | Freq: Two times a day (BID) | ORAL | Status: DC
  Administered 2023-01-26 – 2023-01-27 (×2): 1 via ORAL
  Filled 2023-01-26 (×2): qty 1

## 2023-01-26 MED ORDER — ONDANSETRON HCL 4 MG/2ML IJ SOLN
INTRAMUSCULAR | Status: DC | PRN
  Administered 2023-01-26: 4 mg via INTRAVENOUS

## 2023-01-26 MED ORDER — CYCLOBENZAPRINE HCL 10 MG PO TABS
10.0000 mg | ORAL_TABLET | Freq: Two times a day (BID) | ORAL | Status: DC | PRN
  Administered 2023-01-26: 10 mg via ORAL
  Filled 2023-01-26: qty 1

## 2023-01-26 MED ORDER — OXYCODONE HCL 5 MG PO TABS
ORAL_TABLET | ORAL | Status: AC
  Filled 2023-01-26: qty 1

## 2023-01-26 MED ORDER — SPIRONOLACTONE 25 MG PO TABS
25.0000 mg | ORAL_TABLET | Freq: Every day | ORAL | Status: DC
  Administered 2023-01-26 – 2023-01-27 (×2): 25 mg via ORAL
  Filled 2023-01-26 (×3): qty 1

## 2023-01-26 MED ORDER — DOCUSATE SODIUM 100 MG PO CAPS
100.0000 mg | ORAL_CAPSULE | Freq: Every day | ORAL | Status: DC
  Administered 2023-01-27: 100 mg via ORAL
  Filled 2023-01-26: qty 1

## 2023-01-26 MED ORDER — ACETAMINOPHEN 325 MG RE SUPP: 325.0000 mg | RECTAL | Status: DC | PRN

## 2023-01-26 MED ORDER — CEFAZOLIN SODIUM-DEXTROSE 2-4 GM/100ML-% IV SOLN
2.0000 g | Freq: Three times a day (TID) | INTRAVENOUS | Status: AC
  Administered 2023-01-26 – 2023-01-27 (×2): 2 g via INTRAVENOUS
  Filled 2023-01-26 (×2): qty 100

## 2023-01-26 MED ORDER — SURGIFLO WITH THROMBIN (HEMOSTATIC MATRIX KIT) OPTIME
TOPICAL | Status: DC | PRN
  Administered 2023-01-26: 1 via TOPICAL

## 2023-01-26 MED ORDER — METOPROLOL TARTRATE 5 MG/5ML IV SOLN: 2.0000 mg | INTRAVENOUS | Status: DC | PRN

## 2023-01-26 MED ORDER — 0.9 % SODIUM CHLORIDE (POUR BTL) OPTIME
TOPICAL | Status: DC | PRN
  Administered 2023-01-26: 2000 mL

## 2023-01-26 MED ORDER — HEPARIN SODIUM (PORCINE) 5000 UNIT/ML IJ SOLN: 5000.0000 [IU] | Freq: Three times a day (TID) | INTRAMUSCULAR | Status: DC

## 2023-01-26 MED ORDER — MORPHINE SULFATE (PF) 2 MG/ML IV SOLN
2.0000 mg | INTRAVENOUS | Status: DC | PRN
  Administered 2023-01-26: 4 mg via INTRAVENOUS
  Administered 2023-01-26: 5 mg via INTRAVENOUS
  Administered 2023-01-27 (×2): 2 mg via INTRAVENOUS
  Filled 2023-01-26: qty 3
  Filled 2023-01-26 (×2): qty 1
  Filled 2023-01-26: qty 2

## 2023-01-26 MED ORDER — FENTANYL CITRATE (PF) 250 MCG/5ML IJ SOLN
INTRAMUSCULAR | Status: AC
  Filled 2023-01-26: qty 5

## 2023-01-26 MED ORDER — PANTOPRAZOLE SODIUM 40 MG PO TBEC
40.0000 mg | DELAYED_RELEASE_TABLET | Freq: Every day | ORAL | Status: DC
  Administered 2023-01-27: 40 mg via ORAL
  Filled 2023-01-26 (×2): qty 1

## 2023-01-26 MED ORDER — ATORVASTATIN CALCIUM 80 MG PO TABS
80.0000 mg | ORAL_TABLET | Freq: Every day | ORAL | Status: DC
  Administered 2023-01-26: 80 mg via ORAL
  Filled 2023-01-26: qty 1

## 2023-01-26 MED ORDER — SENNOSIDES-DOCUSATE SODIUM 8.6-50 MG PO TABS: 1.0000 | ORAL_TABLET | Freq: Every evening | ORAL | Status: DC | PRN

## 2023-01-26 MED ORDER — ALBUTEROL SULFATE HFA 108 (90 BASE) MCG/ACT IN AERS
INHALATION_SPRAY | RESPIRATORY_TRACT | Status: AC
  Filled 2023-01-26: qty 6.7

## 2023-01-26 MED ORDER — CARVEDILOL 25 MG PO TABS
25.0000 mg | ORAL_TABLET | Freq: Two times a day (BID) | ORAL | Status: DC
  Administered 2023-01-26 – 2023-01-27 (×3): 25 mg via ORAL
  Filled 2023-01-26 (×4): qty 1

## 2023-01-26 MED ORDER — ALBUTEROL SULFATE HFA 108 (90 BASE) MCG/ACT IN AERS
INHALATION_SPRAY | RESPIRATORY_TRACT | Status: DC | PRN
  Administered 2023-01-26: 8 via RESPIRATORY_TRACT

## 2023-01-26 MED ORDER — PROPOFOL 10 MG/ML IV BOLUS
INTRAVENOUS | Status: DC | PRN
  Administered 2023-01-26: 140 mg via INTRAVENOUS
  Administered 2023-01-26: 30 mg via INTRAVENOUS

## 2023-01-26 MED ORDER — ROSUVASTATIN CALCIUM 20 MG PO TABS: 40.0000 mg | ORAL_TABLET | Freq: Every day | ORAL | Status: DC

## 2023-01-26 MED ORDER — CEFAZOLIN SODIUM-DEXTROSE 2-4 GM/100ML-% IV SOLN
2.0000 g | INTRAVENOUS | Status: AC
  Administered 2023-01-26: 2 g via INTRAVENOUS
  Filled 2023-01-26: qty 100

## 2023-01-26 MED ORDER — EPINEPHRINE 1 MG/10ML IJ SOSY
PREFILLED_SYRINGE | INTRAMUSCULAR | Status: DC | PRN
  Administered 2023-01-26: 50 ug via INTRAVENOUS
  Administered 2023-01-26: 100 ug via INTRAVENOUS
  Administered 2023-01-26 (×2): 50 ug via INTRAVENOUS

## 2023-01-26 MED ORDER — INSULIN ASPART 100 UNIT/ML IJ SOLN: 0.0000 [IU] | INTRAMUSCULAR | Status: DC | PRN

## 2023-01-26 MED ORDER — INSULIN ASPART 100 UNIT/ML IJ SOLN
0.0000 [IU] | Freq: Three times a day (TID) | INTRAMUSCULAR | Status: DC
  Administered 2023-01-26: 3 [IU] via SUBCUTANEOUS
  Administered 2023-01-27: 8 [IU] via SUBCUTANEOUS

## 2023-01-26 MED ORDER — HEPARIN SODIUM (PORCINE) 1000 UNIT/ML IJ SOLN
INTRAMUSCULAR | Status: DC | PRN
  Administered 2023-01-26: 10000 [IU] via INTRAVENOUS

## 2023-01-26 MED ORDER — EPHEDRINE SULFATE-NACL 50-0.9 MG/10ML-% IV SOSY
PREFILLED_SYRINGE | INTRAVENOUS | Status: DC | PRN
  Administered 2023-01-26: 25 mg via INTRAVENOUS
  Administered 2023-01-26: 5 mg via INTRAVENOUS
  Administered 2023-01-26: 10 mg via INTRAVENOUS

## 2023-01-26 MED ORDER — HYDROCODONE-ACETAMINOPHEN 10-325 MG PO TABS: 1.0000 | ORAL_TABLET | Freq: Four times a day (QID) | ORAL | Status: DC | PRN

## 2023-01-26 MED ORDER — PHENYLEPHRINE HCL-NACL 20-0.9 MG/250ML-% IV SOLN
INTRAVENOUS | Status: DC | PRN
  Administered 2023-01-26: 60 ug/min via INTRAVENOUS
  Administered 2023-01-26: 75 ug/min via INTRAVENOUS

## 2023-01-26 MED ORDER — PROPOFOL 500 MG/50ML IV EMUL
INTRAVENOUS | Status: DC | PRN
  Administered 2023-01-26: 25 ug/kg/min via INTRAVENOUS

## 2023-01-26 MED ORDER — ALBUMIN HUMAN 5 % IV SOLN: INTRAVENOUS | Status: DC | PRN

## 2023-01-26 MED ORDER — PROTAMINE SULFATE 10 MG/ML IV SOLN
INTRAVENOUS | Status: AC
  Filled 2023-01-26: qty 5

## 2023-01-26 MED ORDER — OXYCODONE HCL 5 MG/5ML PO SOLN: 5.0000 mg | Freq: Once | ORAL | Status: AC | PRN

## 2023-01-26 MED ORDER — SODIUM CHLORIDE 0.9 % IV SOLN: 500.0000 mL | Freq: Once | INTRAVENOUS | Status: DC | PRN

## 2023-01-26 MED ORDER — LACTATED RINGERS IV SOLN: INTRAVENOUS | Status: DC

## 2023-01-26 MED ORDER — GABAPENTIN 300 MG PO CAPS
300.0000 mg | ORAL_CAPSULE | Freq: Three times a day (TID) | ORAL | Status: DC
  Administered 2023-01-26 – 2023-01-27 (×3): 300 mg via ORAL
  Filled 2023-01-26 (×4): qty 1

## 2023-01-26 SURGICAL SUPPLY — 45 items
BAG COUNTER SPONGE SURGICOUNT (BAG) ×1 IMPLANT
CANISTER SUCT 3000ML PPV (MISCELLANEOUS) ×1 IMPLANT
CATH EMB 3FR 40 (CATHETERS) IMPLANT
CLIP TI MEDIUM 24 (CLIP) ×1 IMPLANT
CLIP TI WIDE RED SMALL 24 (CLIP) ×1 IMPLANT
DERMABOND ADVANCED .7 DNX12 (GAUZE/BANDAGES/DRESSINGS) ×1 IMPLANT
DRAIN CHANNEL 15F RND FF W/TCR (WOUND CARE) IMPLANT
DRAPE X-RAY CASS 24X20 (DRAPES) IMPLANT
DRESSING PEEL AND PLC PRVNA 13 (GAUZE/BANDAGES/DRESSINGS) IMPLANT
DRSG PEEL AND PLACE PREVENA 13 (GAUZE/BANDAGES/DRESSINGS) ×1
ELECT REM PT RETURN 9FT ADLT (ELECTROSURGICAL) ×1
ELECTRODE REM PT RTRN 9FT ADLT (ELECTROSURGICAL) ×1 IMPLANT
EVACUATOR SILICONE 100CC (DRAIN) IMPLANT
GLOVE SURG SS PI 7.5 STRL IVOR (GLOVE) ×3 IMPLANT
GOWN STRL REUS W/ TWL LRG LVL3 (GOWN DISPOSABLE) ×2 IMPLANT
GOWN STRL REUS W/ TWL XL LVL3 (GOWN DISPOSABLE) ×1 IMPLANT
GOWN STRL REUS W/TWL LRG LVL3 (GOWN DISPOSABLE) ×2
GOWN STRL REUS W/TWL XL LVL3 (GOWN DISPOSABLE) ×1
HEMOSTAT SNOW SURGICEL 2X4 (HEMOSTASIS) IMPLANT
KIT BASIN OR (CUSTOM PROCEDURE TRAY) ×1 IMPLANT
KIT DRSG PREVENA PLUS 7DAY 125 (MISCELLANEOUS) IMPLANT
KIT TURNOVER KIT B (KITS) ×1 IMPLANT
NS IRRIG 1000ML POUR BTL (IV SOLUTION) ×2 IMPLANT
PACK PERIPHERAL VASCULAR (CUSTOM PROCEDURE TRAY) ×1 IMPLANT
PAD ARMBOARD 7.5X6 YLW CONV (MISCELLANEOUS) ×2 IMPLANT
PATCH HEMASHIELD 8X75 (Vascular Products) IMPLANT
SET COLLECT BLD 21X3/4 12 (NEEDLE) IMPLANT
SET WALTER ACTIVATION W/DRAPE (SET/KITS/TRAYS/PACK) IMPLANT
SPONGE INTESTINAL PEANUT (DISPOSABLE) ×1 IMPLANT
STOPCOCK 4 WAY LG BORE MALE ST (IV SETS) IMPLANT
SURGIFLO W/THROMBIN 8M KIT (HEMOSTASIS) IMPLANT
SUT ETHILON 3 0 PS 1 (SUTURE) IMPLANT
SUT MNCRL AB 4-0 PS2 18 (SUTURE) IMPLANT
SUT PROLENE 5 0 C 1 24 (SUTURE) ×1 IMPLANT
SUT PROLENE 6 0 BV (SUTURE) ×1 IMPLANT
SUT VIC AB 2-0 CT1 27 (SUTURE) ×1
SUT VIC AB 2-0 CT1 TAPERPNT 27 (SUTURE) ×1 IMPLANT
SUT VIC AB 3-0 SH 27 (SUTURE) ×2
SUT VIC AB 3-0 SH 27X BRD (SUTURE) ×1 IMPLANT
SUT VIC AB 3-0 X1 27 (SUTURE) ×1 IMPLANT
SYR 3ML LL SCALE MARK (SYRINGE) IMPLANT
TOWEL GREEN STERILE (TOWEL DISPOSABLE) ×1 IMPLANT
TUBING EXTENTION W/L.L. (IV SETS) IMPLANT
UNDERPAD 30X36 HEAVY ABSORB (UNDERPADS AND DIAPERS) ×1 IMPLANT
WATER STERILE IRR 1000ML POUR (IV SOLUTION) ×1 IMPLANT

## 2023-01-26 NOTE — Op Note (Signed)
Patient name: Ryan [name] MRN: [id number] DOB: [birth year] Sex: male  01/26/2023 Pre-operative Diagnosis: Left leg claudication Post-operative diagnosis:  Same Surgeon:  Annamarie Major Assistants:  Suezanne Cheshire Procedure:   #1: Left femoral endarterectomy with dacryon patch angioplasty   #2: Redo left femoral artery exposure   #3: Prevena wound VAC placement Anesthesia:  General Blood Loss:  100cc Specimens:  none  Findings: Neointima at the source of stenosis.  This was easily removed.  I placed a dacryon patch down about 1 cm under the superficial femoral artery which extended up onto the hood of the previous aortobifem graft.  Indications: This is a 57 year old gentleman with bilateral claudication.  He recently underwent angiography and was found to have bilateral femoral stenosis.  The right was able to be treated percutaneous, however the left I felt required surgical intervention.  This was discussed with the patient and his wife.  All questions were answered and they wish to proceed.  Procedure:  The patient was identified in the holding area and taken to [place] [facility]  The patient was then placed supine on the table. general anesthesia was administered.  The patient was prepped and draped in the usual sterile fashion.  A time out was called and antibiotics were administered.  The PA was necessary to explain the procedure and assist with technical details.  She help with exposure by providing suction and retraction.  She help with the anastomosis by following the suture.  She help with wound closure.  The previous longitudinal incision was opened with a 10 blade.  Cautery was used divide subcutaneous tissue.  Using sharp and cautery dissection, I exposed the aortobifemoral graft first.  I then individually isolated the superficial femoral artery.  There were 2 large profunda branches that were individually exposed.  Once I had adequate exposure, the patient was fully heparinized.  I  then occluded the main profunda branch of the baby Belenda Cruise and a vessel loop on the secondary profunda branch.  The baby Belenda Cruise clamp was used to occlude the superficial femoral artery and a Cooley J clamp was used to occlude the left limb of the aortobifem graft as well as the common femoral artery.  A #11 blade was used to make an opening in the hood of the graft which extended down onto the superficial femoral artery for approximately 1 cm.  There was a large area of neointima that was easily retrieved.  The tunneling of this went into the profundofemoral artery which was able to be removed via eversion endarterectomy.  The endarterectomy was then irrigated.  All potential embolic debris was removed.  A dacryon patch was brought onto the field.  A running anastomosis was performed with 5-0 Prolene.  Prior to completion, the appropriate flushing maneuvers were performed.  I did not get good backbleeding from the main profunda branch and so I passed a #3 Fogarty catheter down this.  No thrombus was evacuated.  The closure was then completed.  There were triphasic signals in both profunda branches as well as the superficial femoral artery.  The patient's heparin was reversed with protamine.  Hemostasis was achieved.  The wound was irrigated.  Surgiflo was placed on the raw surface areas.  The femoral sheath was reapproximated with 2-0 Vicryl.  Subcutaneous tissue was then closed with 2 additional layers of 3-0 Vicryl followed by subcuticular closure.  A Prevena wound VAC was placed.  The patient was successfully extubated taken recovery in stable condition.  There  are no immediate complications.   Disposition: To PACU stable.   Theotis Burrow, M.D., [facility] [place] Office: [phone] Pager:  [phone]

## 2023-01-26 NOTE — Anesthesia Procedure Notes (Addendum)
Arterial Line Insertion Start/End2/23/2024 7:00 AM, 01/26/2023 7:10 AM Performed by: Albertha Ghee, MD, Minerva Ends, CRNA, CRNA  Patient location: Pre-op. Preanesthetic checklist: patient identified, IV checked, site marked, risks and benefits discussed, surgical consent, monitors and equipment checked, pre-op evaluation, timeout performed and anesthesia consent Lidocaine 1% used for infiltration Right, radial was placed Catheter size: 20 G Hand hygiene performed , maximum sterile barriers used  and Seldinger technique used Allen's test indicative of satisfactory collateral circulation Attempts: 2 Procedure performed without using ultrasound guided technique. Following insertion, dressing applied and Biopatch. Post procedure assessment: normal and unchanged  Patient tolerated the procedure well with no immediate complications.

## 2023-01-26 NOTE — H&P (Signed)
Office Note     History of Present Illness    Ryan [name] is a 57 y.o. (1966-05-11) male who presents for surveillance of PAD and carotid artery stenosis.  He has a history of aortobifemoral bypass in 2014 and left carotid subclavian bypass in 2014.  He also underwent right femoral to above-knee popliteal artery bypass with ipsilateral nonreversed greater saphenous vein on 03/13/2018 by Dr. Trula Slade. In 2019 he also underwent stenting of the left SFA. He developed worsening right lower extremity claudication in 2022 and required DCBA of the right common femoral artery. We have not seen the patient since his procedure in 2022.   At follow-up today, the patient states that he is started developing worsening bilateral lower extremity claudication for the past 2 months.  He states that he was started developing claudication in both calves starting at 100 feet.  This is now progressed to severe claudication starting at 50 feet.  He states that the claudication is worse in the left leg than the right.  He denies any rest pain or new wounds.  He denies any stroke or TIA-like symptoms.  He denies any claudication in the left arm.  He is still taking a statin, aspirin, and Eliquis 5 mg twice daily.             Current Outpatient Medications  Medication Sig Dispense Refill   aspirin EC 81 MG tablet Take 1 tablet (81 mg total) by mouth daily. 90 tablet 3   carvedilol (COREG) 25 MG tablet Take 1 tablet (25 mg total) by mouth 2 (two) times daily. 180 tablet 3   cetirizine (ZYRTEC) 10 MG tablet Take 10 mg by mouth daily.       clopidogrel (PLAVIX) 75 MG tablet Take 75 mg by mouth daily.       cyclobenzaprine (FLEXERIL) 10 MG tablet Take 1 tablet (10 mg total) by mouth 2 (two) times daily as needed for muscle spasms. 30 tablet 0   diazepam (VALIUM) 5 MG tablet Take 5 mg by mouth daily as needed for anxiety.       ELIQUIS 5 MG TABS tablet TAKE 1 TABLET BY MOUTH 2 TIMES A DAY 180 tablet 1   ENTRESTO 24-26 MG  TAKE 1 TABLET BY MOUTH TWICE DAILY 60 tablet 0   FARXIGA 10 MG TABS tablet Take 10 mg by mouth daily.       furosemide (LASIX) 40 MG tablet TAKE 1 TABLET BY MOUTH EACH DAY 90 tablet 3   gabapentin (NEURONTIN) 300 MG capsule Take 300 mg by mouth 3 (three) times daily.       insulin glargine (LANTUS) 100 UNIT/ML Solostar Pen Inject 50 Units into the skin 2 (two) times daily.       insulin lispro (HUMALOG) 100 UNIT/ML injection Inject 2-15 Units into the skin 3 (three) times daily before meals. sliding scale .       naloxone (NARCAN) nasal spray 4 mg/0.1 mL Place 4 mg into the nose once as needed (overdose).       nitroGLYCERIN (NITROSTAT) 0.4 MG SL tablet Place 0.4 mg under the tongue every 5 (five) minutes as needed for chest pain.       oxyCODONE-acetaminophen (PERCOCET) 10-325 MG tablet Take 1 tablet by mouth 4 (four) times daily.       pantoprazole (PROTONIX) 40 MG tablet Take 40 mg by mouth daily.       rosuvastatin (CRESTOR) 20 MG tablet Take 2 tablets (40 mg total) by  mouth daily. 180 tablet 3    No current facility-administered medications for this visit.      REVIEW OF SYSTEMS (negative unless checked):    Cardiac:  '[]'$  Chest pain or chest pressure? '[]'$  Shortness of breath upon activity? '[]'$  Shortness of breath when lying flat? '[]'$  Irregular heart rhythm?   Vascular:  '[x]'$  Pain in calf, thigh, or hip brought on by walking? '[]'$  Pain in feet at night that wakes you up from your sleep? '[]'$  Blood clot in your veins? '[]'$  Leg swelling?   Pulmonary:  '[]'$  Oxygen at home? '[]'$  Productive cough? '[]'$  Wheezing?   Neurologic:  '[]'$  Sudden weakness in arms or legs? '[]'$  Sudden numbness in arms or legs? '[]'$  Sudden onset of difficult speaking or slurred speech? '[]'$  Temporary loss of vision in one eye? '[]'$  Problems with dizziness?   Gastrointestinal:  '[]'$  Blood in stool? '[]'$  Vomited blood?   Genitourinary:  '[]'$  Burning when urinating? '[]'$  Blood in urine?   Psychiatric:  '[]'$  [name] depression    Hematologic:  '[]'$  Bleeding problems? '[]'$  Problems with blood clotting?   Dermatologic:  '[]'$  Rashes or ulcers?   Constitutional:  '[]'$  Fever or chills?   Ear/Nose/Throat:  '[]'$  Change in hearing? '[]'$  Nose bleeds? '[]'$  Sore throat?   Musculoskeletal:  '[]'$  Back pain? '[]'$  Joint pain? '[]'$  Muscle pain?     Physical Examination        Vitals:    12/11/22 1137 12/11/22 1138  BP: 112/78 113/79  Pulse: 99 99  Resp: 18    Temp: 97.7 F (36.5 C)    TempSrc: Temporal    SpO2: 97%    Weight: 222 lb (100.7 kg)    Height: '5\' 9"'$  (1.753 m)      Body mass index is 32.78 kg/m.   General:  WDWN in NAD; vital signs documented above Gait: Not observed HENT: WNL, normocephalic Pulmonary: normal non-labored breathing  Cardiac: regular HR, without carotid bruit Abdomen: soft, NT, no masses Skin: without rashes Vascular Exam/Pulses: Palpable radial pulses 2+.  Monophasic left PT/DP Doppler signal.  Monophasic right DP and triphasic right PT Doppler signal Extremities: without ischemic changes, without Gangrene , without cellulitis; without open wounds;  Musculoskeletal: no muscle wasting or atrophy       Neurologic: A&O X 3;  No focal weakness or paresthesias are detected Psychiatric:  The pt has Normal affect.   Non-Invasive Vascular Imaging ABI (11/06/2022) +-------+-----------+-----------+------------+------------+  ABI/TBIToday's ABIToday's TBIPrevious ABIPrevious TBI  +-------+-----------+-----------+------------+------------+  Right 1.13       0.67       0.91        0.76          +-------+-----------+-----------+------------+------------+  Left  1.00       0.69       1.03        1.00          +-------+-----------+-----------+------------+------------+    RLE BPG Duplex (11/06/2022) There is monophasic inflow to the right femoropopliteal bypass graft.  There is a focal area of stenosis at the proximal anastomosis with PSV of 211.  There is biphasic outflow.    BLE  Carotid Duplex (11/06/2022) Bilateral carotid artery stenosis 1 to 39%.  Patent left subclavian carotid artery bypass graft.  Elevated velocities of 251 and 232 at the proximal and distal portions of the subclavian carotid bypass.     Medical Decision Making    Ryan [name] is a 57 y.o. male who presents for surveillance of PAD and carotid artery  stenosis.   Based on the patient's vascular studies, his ABIs bilaterally are essentially unchanged.  His right ABI is 1.13 and was 0.91.  His left ABI is 1 and was 1.03.  His TBI's are decreased bilaterally. Duplex of the right femoropopliteal bypass graft demonstrates a patent bypass graft with a focal area of stenosis at the proximal anastomosis with PSV of 211.  The patient's left SFA stent was not studied today. Bilateral carotid artery duplex study demonstrates stable stenosis bilaterally of 1 to 39%.  The left subclavian carotid artery bypass graft is also patent.  There are elevated velocities at the proximal and distal portions of the bypass, however the patient is asymptomatic and has a palpable radial pulse. He has had worsening bilateral lower extremity claudication at 50 feet.  The symptoms have been worsening over 2 months.  The patient will be scheduled  in 2-3 wks for an aortogram with bilateral lower extremity runoff and possible intervention on the left SFA stent and right femoropopliteal bypass graft.  Access will need to be obtained through the groin.  His left carotid subclavian bypass can be reimaged in 1 yr.     Ryan Schuh PA-C [facility] [place] Office: [phone]   Angio was bale to treat the right femoral stenosis but there was disease in the left common femoral artery that is best treated with endarterectomy.  Surgery details discussed with patinet and wife.  All questions answered. Ok to proceed with left femoral endarterectomy and patch repair.  Ryan [name]

## 2023-01-26 NOTE — Discharge Instructions (Addendum)
[facility]  Discharge instructions  Lower Extremity Surgery  Please refer to the following instruction for your post-procedure care. Your surgeon or physician assistant will discuss any changes with you.  Activity  You are encouraged to walk as much as you can. You can slowly return to normal activities during the month after your surgery. Avoid strenuous activity and heavy lifting until your doctor tells you it's OK. Avoid activities such as vacuuming or swinging a golf club. Do not drive until your doctor give the OK and you are no longer taking prescription pain medications. It is also normal to have difficulty with sleep habits, eating and bowel movement after surgery. These will go away with time.  Bathing/Showering  Shower daily after you go home. Do not soak in a bathtub, hot tub, or swim until the incision heals completely.  Incision Care  Clean your incision with mild soap and water. Shower every day. Pat the area dry with a clean towel. You do not need a bandage unless otherwise instructed. Do not apply any ointments or creams to your incision. If you have open wounds you will be instructed how to care for them or a visiting nurse may be arranged for you. If you have staples or sutures along your incision they will be removed at your post-op appointment. You may have skin glue on your incision. Do not peel it off. It will come off on its own in about one week.  Keep Pravena wound vac on your groin incision until it loses it seal in about 7-10 days.  Once that happens, you can remove and then wash the groin wound with soap and water daily and pat dry. (No tub bath-only shower)  Then put a dry gauze or washcloth in the groin to keep this area dry to help prevent wound infection.  Do this daily and as needed.  Do not use Vaseline or neosporin on your incisions.  Only use soap and water on your incisions and then protect and keep dry.   Diet  Resume  your normal diet. There are no special food restrictions following this procedure. A low fat/ low cholesterol diet is recommended for all patients with vascular disease. In order to heal from your surgery, it is CRITICAL to get adequate nutrition. Your body requires vitamins, minerals, and protein. Vegetables are the best source of vitamins and minerals. Vegetables also provide the perfect balance of protein. Processed food has little nutritional value, so try to avoid this.  Medications  Resume taking all your medications unless your doctor or physician assistant tells you not to. If your incision is causing pain, you may take over-the-counter pain relievers such as acetaminophen (Tylenol). If you were prescribed a stronger pain medication, please aware these medication can cause nausea and constipation. Prevent nausea by taking the medication with a snack or meal. Avoid constipation by drinking plenty of fluids and eating foods with high amount of fiber, such as fruits, vegetables, and grains. Take Colace 100 mg (an over-the-counter stool softener) twice a day as needed for constipation.   Do not take Tylenol if you are taking prescription pain medications.  Follow Up  Our office will schedule a follow up appointment 2-3 weeks following discharge.  Please call us immediately for any of the following conditions  Severe or worsening pain in your legs or feet while at rest or while walking Increase pain, redness, warmth, or drainage (pus) from your incision site(s) Fever of 101  degree or higher The swelling in your leg with the bypass suddenly worsens and becomes more painful than when you were in the hospital If you have been instructed to feel your graft pulse then you should do so every day. If you can no longer feel this pulse, call the office immediately. Not all patients are given this instruction.  Leg swelling is common after leg bypass surgery.  The swelling should improve over a few  months following surgery. To improve the swelling, you may elevate your legs above the level of your heart while you are sitting or resting. Your surgeon or physician assistant may ask you to apply an ACE wrap or wear compression (TED) stockings to help to reduce swelling.  Reduce your risk of vascular disease  Stop smoking. If you would like help call [facility] at 1-800-QUIT-NOW 218-776-2110) or [facility] at (248)512-0926.  Manage your cholesterol Maintain a desired weight Control your diabetes weight Control your diabetes Keep your blood pressure down  If you have any questions, please call the office at [phone]

## 2023-01-26 NOTE — Evaluation (Signed)
Physical Therapy Evaluation Patient Details Name: Ryan [name] MRN: [id number] DOB: 01/29/1966 Today's Date: 01/26/2023  History of Present Illness  57 y.o. male admitted 2/23 and underwent Left femoral endarterectomy with dacryon patch angioplasty. PMHx:  smoking, CAD, STEMI complicated by VT/cardiac arrest, CABG, NSTEMI, ischemic cardiomyopathy, chronic combined systolic and diastolic CHF, PAF, HLD, HTN, PAD, DM2, GERD. Extensive surgical hx.  Clinical Impression  Patient is s/p above surgery resulting in functional limitations due to the deficits listed below (see PT Problem List). Independent PTA, active with yard work. Pt at supervision level today with transfers and gait, mobilizing up to 115 feet with rolling walker for light support. No evidence of buckling or over LOB with this AD. Pt may want a SPC which can be assessed next visit. Family present and supportive. SpO2 90% on room air while ambulating, 94% and greater with breathing techniques at rest. Patient will benefit from skilled PT to increase their independence and safety with mobility to allow discharge to the venue listed below.          Recommendations for follow up therapy are one component of a multi-disciplinary discharge planning process, led by the attending physician.  Recommendations may be updated based on patient status, additional functional criteria and insurance authorization.  Follow Up Recommendations Outpatient PT (once cleared by surgeon)      Assistance Recommended at Discharge Set up Supervision/Assistance  Patient can return home with the following  A little help with walking and/or transfers;A little help with bathing/dressing/bathroom;Assist for transportation;Help with stairs or ramp for entrance;Assistance with cooking/housework    Equipment Recommendations  (TBD (cane vs RW))  Recommendations for Other Services       Functional Status Assessment Patient has had a recent decline in their functional  status and demonstrates the ability to make significant improvements in function in a reasonable and predictable amount of time.     Precautions / Restrictions Precautions Precautions: None Restrictions Weight Bearing Restrictions: No      Mobility  Bed Mobility Overal bed mobility: Modified Independent             General bed mobility comments: extra time    Transfers Overall transfer level: Needs assistance Equipment used: Rolling walker (2 wheels) Transfers: Sit to/from Stand Sit to Stand: Supervision           General transfer comment: supervision for safety, no physical assist needed, slow to rise. Wide BOS 2/2 pain.    Ambulation/Gait Ambulation/Gait assistance: Supervision Gait Distance (Feet): 115 Feet Assistive device: Rolling walker (2 wheels) Gait Pattern/deviations: Step-through pattern, Wide base of support, Decreased stride length Gait velocity: decr Gait velocity interpretation: 1.31 - 2.62 ft/sec, indicative of limited community ambulator   General Gait Details: Slightly reduced gait speed, light use of RW for support. No signs of buckling or overt LOB wtih this device. Pt requests SPC trial, will have therapist bring next visit. Supervision for safety throughout. SpO2 90% on room air.  Stairs            Wheelchair Mobility    Modified Rankin (Stroke Patients Only)       Balance Overall balance assessment: Mild deficits observed, not formally tested                                           Pertinent Vitals/Pain Pain Assessment Pain Assessment: 0-10 Pain Score: 10-Worst pain ever  Pain Location: groin Pain Descriptors / Indicators: Aching, Burning Pain Intervention(s): Limited activity within patient's tolerance, Monitored during session, Premedicated before session, Repositioned    Home Living Family/patient expects to be discharged to:: Private residence Living Arrangements: Spouse/significant  other Available Help at Discharge: Family;Available 24 hours/day Type of Home: Mobile home Home Access: Stairs to enter Entrance Stairs-Rails: None Entrance Stairs-Number of Steps: 3   Home Layout: One level Home Equipment: None      Prior Function Prior Level of Function : Independent/Modified Independent             Mobility Comments: no assist       Hand Dominance   Dominant Hand:  (amb.)    Extremity/Trunk Assessment   Upper Extremity Assessment Upper Extremity Assessment: Defer to OT evaluation    Lower Extremity Assessment Lower Extremity Assessment: LLE deficits/detail LLE Deficits / Details: post-op guarding, WFL for tasks assessed       Communication   Communication: No difficulties  Cognition Arousal/Alertness: Awake/alert Behavior During Therapy: WFL for tasks assessed/performed Overall Cognitive Status: Within Functional Limits for tasks assessed                                          General Comments General comments (skin integrity, edema, etc.): SpO2 at rest on RA 94%    Exercises     Assessment/Plan    PT Assessment Patient needs continued PT services  PT Problem List Decreased strength;Decreased activity tolerance;Decreased balance;Decreased mobility;Decreased knowledge of use of DME;Cardiopulmonary status limiting activity;Pain       PT Treatment Interventions DME instruction;Gait training;Stair training;Therapeutic activities;Functional mobility training;Therapeutic exercise;Balance training;Neuromuscular re-education;Patient/family education    PT Goals (Current goals can be found in the Care Plan section)  Acute Rehab PT Goals Patient Stated Goal: get well go home PT Goal Formulation: With patient Time For Goal Achievement: 02/09/23 Potential to Achieve Goals: Good    Frequency Min 3X/week     Co-evaluation               AM-PAC PT "6 Clicks" Mobility  Outcome Measure Help needed turning from your  back to your side while in a flat bed without using bedrails?: None Help needed moving from lying on your back to sitting on the side of a flat bed without using bedrails?: None Help needed moving to and from a bed to a chair (including a wheelchair)?: A Little Help needed standing up from a chair using your arms (e.g., wheelchair or bedside chair)?: A Little Help needed to walk in hospital room?: A Little Help needed climbing 3-5 steps with a railing? : A Little 6 Click Score: 20    End of Session Equipment Utilized During Treatment: Gait belt Activity Tolerance: Patient tolerated treatment well Patient left: in bed;with call bell/phone within reach;with bed alarm set;with family/visitor present Nurse Communication: Mobility status (a-line) PT Visit Diagnosis: Other abnormalities of gait and mobility (R26.89);Difficulty in walking, not elsewhere classified (R26.2);Pain Pain - Right/Left: Left Pain - part of body: Hip    Time: [id number] PT Time Calculation (min) (ACUTE ONLY): 24 min   Charges:   PT Evaluation $PT Eval Low Complexity: 1 Low PT Treatments $Gait Training: 8-22 mins        Candie Mile, PT, DPT Physical Therapist Acute Rehabilitation Services [place]  Ellouise Newer 01/26/2023, 5:12 PM

## 2023-01-26 NOTE — Transfer of Care (Signed)
Immediate Anesthesia Transfer of Care Note  Patient: Ishmel Pelt  Procedure(s) Performed: REDO LEFT COMMON FEMORAL ENDARTERECTOMY WITH VPA (Left: Groin) PATCH ANGIOPLASTY USING HEMASHIELD PLATINUM FINESSE (Left: Groin)  Patient Location: PACU  Anesthesia Type:MAC  Level of Consciousness: awake, alert , and oriented  Airway & Oxygen Therapy: Patient Spontanous Breathing and Patient connected to nasal cannula oxygen  Post-op Assessment: Report given to RN and Post -op Vital signs reviewed and stable  Post vital signs: Reviewed and stable  Last Vitals:  Vitals Value Taken Time  BP 100/66 01/26/23 1015  Temp 98   Pulse 82 01/26/23 1022  Resp 15 01/26/23 1022  SpO2 89 % 01/26/23 1022  Vitals shown include unvalidated device data.  Last Pain:  Vitals:   01/26/23 1015  TempSrc:   PainSc: 0-No pain         Complications: No notable events documented.

## 2023-01-26 NOTE — Anesthesia Procedure Notes (Addendum)
Procedure Name: Intubation Date/Time: 01/26/2023 7:55 AM  Performed by: Albertha Ghee, MDPre-anesthesia Checklist: Patient identified, Emergency Drugs available, Suction available and Patient being monitored Patient Re-evaluated:Patient Re-evaluated prior to induction Oxygen Delivery Method: Circle system utilized Preoxygenation: Pre-oxygenation with 100% oxygen Induction Type: IV induction Ventilation: Mask ventilation with difficulty and Oral airway inserted - appropriate to patient size Laryngoscope Size: Mac and 3 Grade View: Grade II Tube type: Oral Tube size: 7.0 mm Number of attempts: 1 Airway Equipment and Method: Stylet and Oral airway Placement Confirmation: ETT inserted through vocal cords under direct vision, positive ETCO2 and breath sounds checked- equal and bilateral Secured at: 23 cm Tube secured with: Tape Dental Injury: Teeth and Oropharynx as per pre-operative assessment

## 2023-01-27 LAB — BASIC METABOLIC PANEL
Anion gap: 11 (ref 5–15)
BUN: 17 mg/dL (ref 6–20)
CO2: 21 mmol/L — ABNORMAL LOW (ref 22–32)
Calcium: 8.4 mg/dL — ABNORMAL LOW (ref 8.9–10.3)
Chloride: 97 mmol/L — ABNORMAL LOW (ref 98–111)
Creatinine, Ser: 1.36 mg/dL — ABNORMAL HIGH (ref 0.61–1.24)
GFR, Estimated: >60 mL/min (ref >60)
Glucose, Bld: 260 mg/dL — ABNORMAL HIGH (ref 70–99)
Potassium: 4 mmol/L (ref 3.5–5.1)
Sodium: 129 mmol/L — ABNORMAL LOW (ref 135–145)

## 2023-01-27 LAB — CBC
HCT: 40.2 % (ref 39.0–52.0)
Hemoglobin: 14.2 g/dL (ref 13.0–17.0)
MCH: 33.2 pg (ref 26.0–34.0)
MCHC: 35.3 g/dL (ref 30.0–36.0)
MCV: 93.9 fL (ref 80.0–100.0)
Platelets: 163 10*3/uL (ref 150–400)
RBC: 4.28 MIL/uL (ref 4.22–5.81)
RDW: 14.5 % (ref 11.5–15.5)
WBC: 15.3 10*3/uL — ABNORMAL HIGH (ref 4.0–10.5)
nRBC: 0 % (ref 0.0–0.2)

## 2023-01-27 LAB — POCT ACTIVATED CLOTTING TIME: Activated Clotting Time: 255 seconds

## 2023-01-27 LAB — GLUCOSE, CAPILLARY: Glucose-Capillary: 293 mg/dL — ABNORMAL HIGH (ref 70–99)

## 2023-01-27 NOTE — Evaluation (Signed)
Occupational Therapy Evaluation Patient Details Name: Ryan [name] MRN: [id number] DOB: 07-21-1966 Today's Date: 01/27/2023   History of Present Illness 57 y.o. male admitted 2/23 and underwent Left femoral endarterectomy with dacryon patch angioplasty. PMHx:  smoking, CAD, STEMI complicated by VT/cardiac arrest, CABG, NSTEMI, ischemic cardiomyopathy, chronic combined systolic and diastolic CHF, PAF, HLD, HTN, PAD, DM2, GERD. Extensive surgical hx.   Clinical Impression   Ryan [name] was evaluated s/p the above admission list. He is indep at baseline. Upon evaluation he has mild functional limitations due to decreased activity tolerance and wound vac management. Overall he was mod I for ADLs with good carry over of education and technique of wound vac management and ways to reduce fall risk. Pt will benefit from continued acute OT services. Recommend d/c to home with assist of family, no follow up OT needed.        Recommendations for follow up therapy are one component of a multi-disciplinary discharge planning process, led by the attending physician.  Recommendations may be updated based on patient status, additional functional criteria and insurance authorization.   Follow Up Recommendations  No OT follow up     Assistance Recommended at Discharge PRN  Patient can return home with the following Assist for transportation    Functional Status Assessment  Patient has had a recent decline in their functional status and demonstrates the ability to make significant improvements in function in a reasonable and predictable amount of time.        Precautions / Restrictions Precautions Precautions: None Restrictions Weight Bearing Restrictions: No      Mobility Bed Mobility Overal bed mobility: Modified Independent             General bed mobility comments: extra time    Transfers Overall transfer level: Modified independent Equipment used: None   Sit to Stand: Modified  independent (Device/Increase time)                  Balance Overall balance assessment: No apparent balance deficits (not formally assessed)                                         ADL either performed or assessed with clinical judgement   ADL Overall ADL's : Modified independent                                       General ADL Comments: cues for safety, wound vac management and compensatory techniques     Vision Baseline Vision/History: 0 No visual deficits Vision Assessment?: No apparent visual deficits     Perception Perception Perception Tested?: No   Praxis Praxis Praxis tested?: Not tested    Pertinent Vitals/Pain Pain Assessment Pain Assessment: No/denies pain     Hand Dominance Right   Extremity/Trunk Assessment Upper Extremity Assessment Upper Extremity Assessment: Overall WFL for tasks assessed   Lower Extremity Assessment Lower Extremity Assessment: Overall WFL for tasks assessed   Cervical / Trunk Assessment Cervical / Trunk Assessment: Normal   Communication Communication Communication: No difficulties   Cognition Arousal/Alertness: Awake/alert Behavior During Therapy: WFL for tasks assessed/performed Overall Cognitive Status: Within Functional Limits for tasks assessed  General Comments  VSS on RA    Exercises     Shoulder Instructions      Home Living Family/patient expects to be discharged to:: Private residence Living Arrangements: Spouse/significant other Available Help at Discharge: Family;Available 24 hours/day Type of Home: Mobile home Home Access: Stairs to enter Entrance Stairs-Number of Steps: 3 Entrance Stairs-Rails: None Home Layout: One level     Bathroom Shower/Tub: Occupational psychologist: Standard     Home Equipment: Cane - single point          Prior Functioning/Environment Prior Level of Function :  Independent/Modified Independent             Mobility Comments: SPC when needed ADLs Comments: indep        OT Problem List: Decreased activity tolerance      OT Treatment/Interventions:      OT Goals(Current goals can be found in the care plan section) Acute Rehab OT Goals Patient Stated Goal: home OT Goal Formulation: With patient Time For Goal Achievement: 01/27/23 Potential to Achieve Goals: Good  OT Frequency:      Co-evaluation              AM-PAC OT "6 Clicks" Daily Activity     Outcome Measure Help from another person eating meals?: None Help from another person taking care of personal grooming?: None Help from another person toileting, which includes using toliet, bedpan, or urinal?: None Help from another person bathing (including washing, rinsing, drying)?: None Help from another person to put on and taking off regular upper body clothing?: None Help from another person to put on and taking off regular lower body clothing?: None 6 Click Score: 24   End of Session Nurse Communication: Mobility status  Activity Tolerance: Patient tolerated treatment well Patient left: in bed;with call bell/phone within reach  OT Visit Diagnosis: Unsteadiness on feet (R26.81);Other abnormalities of gait and mobility (R26.89)                Time: 0902-0920 OT Time Calculation (min): 18 min Charges:  OT General Charges $OT Visit: 1 Visit OT Evaluation $OT Eval Low Complexity: 1 Low  Ryan [name], OTR/L Acute Rehabilitation Services Office [place] Communication Preferred   Ryan [name] 01/27/2023, 11:18 AM

## 2023-01-27 NOTE — Progress Notes (Signed)
Orders for discharge received.  Telemetry removed, CCMD notified.  No IV access to be removed.  Wife is en route to pick up.

## 2023-01-27 NOTE — Progress Notes (Addendum)
PHARMACIST LIPID MONITORING   Ryan [name] is a 57 y.o. male admitted on 01/26/2023 with left leg claudication.  Pharmacy has been consulted to optimize lipid-lowering therapy with the indication of secondary prevention for clinical ASCVD.  Recent Labs:  Lipid Panel (last 6 months):   Lab Results  Component Value Date   CHOL 164 12/20/2022   TRIG 207 (H) 12/20/2022   HDL 25 (L) 12/20/2022   CHOLHDL 6.6 12/20/2022   VLDL 41 (H) 12/20/2022   LDLCALC 98 12/20/2022    Hepatic function panel (last 6 months):   Lab Results  Component Value Date   AST 27 01/22/2023   ALT 25 01/22/2023   ALKPHOS 137 (H) 01/22/2023   BILITOT 0.8 01/22/2023    SCr (since admission):   Serum creatinine: 1.36 mg/dL (H) 01/27/23 0455 Estimated creatinine clearance: 72.1 mL/min (A)  Current therapy and lipid therapy tolerance Current lipid-lowering therapy: atorvastatin 80 Previous lipid-lowering therapies (if applicable): rosuvastatin, simvastatin,  Documented or reported allergies or intolerances to lipid-lowering therapies (if applicable): none  Assessment:   Patient with elevated LDL requiring intensification of lipid lowering therapy.   Plan:    1.Statin intensity (high intensity recommended for all patients regardless of the LDL):  No statin changes. The patient is already on a high intensity statin.  2.Add ezetimibe (if any one of the following):   On a high intensity statin with LDL > 70.  3.Refer to lipid clinic:   Yes  4.Follow-up with:  Lipid Clinic   5.Follow-up labs after discharge:  Changes in lipid therapy were made. Check a lipid panel in 8-12 weeks then annually.       Gena Fray, PharmD PGY1 Pharmacy Resident   01/27/2023 8:20 AM

## 2023-01-27 NOTE — Progress Notes (Addendum)
  Progress Note    01/27/2023 8:09 AM 1 Day Post-Op  Subjective:  feels a little sore in left groin today but mobilizing well.     Vitals:   01/27/23 0405 01/27/23 0406  BP: 118/80   Pulse: 80   Resp: 20 (!) 21  Temp: 98.4 F (36.9 C)   SpO2: 92% 92%    Physical Exam: Cardiac:  RRR Lungs:  nonlabored Incisions:  L groin soft and intact with prevena vac Extremities:  Brisk L DP/PT/Peroneal doppler signals  CBC    Component Value Date/Time   WBC 15.3 (H) 01/27/2023 0455   RBC 4.28 01/27/2023 0455   HGB 14.2 01/27/2023 0455   HGB 13.9 06/03/2020 1144   HCT 40.2 01/27/2023 0455   HCT 42.4 06/03/2020 1144   PLT 163 01/27/2023 0455   PLT 315 06/03/2020 1144   MCV 93.9 01/27/2023 0455   MCV 95 06/03/2020 1144   MCH 33.2 01/27/2023 0455   MCHC 35.3 01/27/2023 0455   RDW 14.5 01/27/2023 0455   RDW 13.3 06/03/2020 1144   LYMPHSABS 2.0 03/29/2022 1746   MONOABS 1.0 03/29/2022 1746   EOSABS 0.2 03/29/2022 1746   BASOSABS 0.1 03/29/2022 1746    BMET    Component Value Date/Time   NA 129 (L) 01/27/2023 0455   NA 133 (L) 06/18/2020 0945   K 4.0 01/27/2023 0455   CL 97 (L) 01/27/2023 0455   CO2 21 (L) 01/27/2023 0455   GLUCOSE 260 (H) 01/27/2023 0455   BUN 17 01/27/2023 0455   BUN 17 06/18/2020 0945   CREATININE 1.36 (H) 01/27/2023 0455   CREATININE 0.98 10/11/2016 0941   CALCIUM 8.4 (L) 01/27/2023 0455   GFRNONAA >60 01/27/2023 0455   GFRAA 87 06/18/2020 0945    INR    Component Value Date/Time   INR 1.1 01/22/2023 1323     Intake/Output Summary (Last 24 hours) at 01/27/2023 0809 Last data filed at 01/27/2023 0430 Gross per 24 hour  Intake 1250 ml  Output 2800 ml  Net -1550 ml      Assessment/Plan:  57 y.o. male is 1 day post op,s/p: left femoral endarterectomy with dacryon patch angioplasty    -Pain is well controlled on current regime -L groin is soft and has prevena vac with good seal. This will stay on for another 5-7 days -Brisk LLE  doppler signals -Patient has walked up and down the halls without issue. He is ready to go home -Will d/c home today with 2-3 wk follow up for incision check. He states he has a rolling walker at home that he can use for assistance -Resume Eliquis, ASA, and statin   Vicente Serene, PA-C [facility] [phone] 01/27/2023 8:09 AM  VASCULAR STAFF ADDENDUM: I have independently interviewed and examined the patient. I agree with the above.  Home today. Discussed Prevena at length, showed him how the device is charged.  Plan for 2 week wound check   Cassandria Santee, MD [facility] Phone Number: [phone] 01/27/2023 8:58 AM

## 2023-01-29 ENCOUNTER — Telehealth: Payer: Self-pay | Admitting: Physician Assistant

## 2023-01-29 ENCOUNTER — Encounter (HOSPITAL_COMMUNITY): Payer: Self-pay | Admitting: Surgery

## 2023-01-29 NOTE — Telephone Encounter (Signed)
-----   Message from [facility], [place] sent at 01/27/2023  8:22 AM EST ----- S/p L common femoral endarterectomy, 2/23  Please arrange follow up with PA in 2 wks for incision check, on [place] office day

## 2023-01-29 NOTE — Anesthesia Postprocedure Evaluation (Signed)
Anesthesia Post Note  Patient: Ryan [name]  Procedure(s) Performed: REDO LEFT COMMON FEMORAL ENDARTERECTOMY WITH VPA (Left: Groin) PATCH ANGIOPLASTY USING HEMASHIELD PLATINUM FINESSE (Left: Groin)     Patient location during evaluation: PACU Anesthesia Type: General Level of consciousness: awake and alert Pain management: pain level controlled Vital Signs Assessment: post-procedure vital signs reviewed and stable Respiratory status: spontaneous breathing, nonlabored ventilation, respiratory function stable and patient connected to nasal cannula oxygen Cardiovascular status: blood pressure returned to baseline and stable Postop Assessment: no apparent nausea or vomiting Anesthetic complications: no   No notable events documented.  Last Vitals:  Vitals:   01/27/23 0406 01/27/23 0900  BP:  102/83  Pulse:  90  Resp: (!) 21 18  Temp:  36.4 C  SpO2: 92% 92%    Last Pain:  Vitals:   01/27/23 0900  TempSrc: Oral  PainSc: [place]

## 2023-01-31 NOTE — Discharge Summary (Signed)
Discharge Summary  Patient ID: Ryan [name] [id number] 57 y.o. 1966-01-09  Admit date: 01/26/2023  Discharge date and time: 01/27/2023  9:47 AM   Admitting Physician: Serafina Mitchell, MD   Discharge Physician: Serafina Mitchell MD  Admission Diagnoses: Claudication in peripheral vascular disease [facility]) [I73.9]  Discharge Diagnoses: Claudication in peripheral vascular disease ([place]) [I73.9]  Admission Condition: fair  Discharged Condition: good  Indication for Admission: This is a 57 year old gentleman with bilateral claudication. He recently underwent angiography and was found to have bilateral femoral stenosis. The right was able to be treated percutaneous, however the left I felt required surgical intervention. This was discussed with the patient and his wife. All questions were answered and they wish to proceed.   Hospital Course: The patient was admitted to the hospital on 01/26/2023 and underwent left femoral endarterectomy with dacryon patch angioplasty and prevena vac placement. He tolerated the procedure well and was transferred to the PACU In stable condition.   POD1 the patient was ambulating well and tolerating a normal diet. His pain was well controlled. His prevena vac had a good seal in the left groin. He was discharged home on POD1.  Consults: None  Treatments: IV hydration, antibiotics: Ancef, analgesia: oxycodone, morphine, anticoagulation meds: apixaban, and surgery: left femoral endarterectomy with dacryon patch angioplasty  Discharge Exam: See progress note  Vitals:   01/27/23 0406 01/27/23 0900  BP:  102/83  Pulse:  90  Resp: (!) 21 18  Temp:  97.6 F (36.4 C)  SpO2: 92% 92%     Disposition: Discharge disposition: 01-Home or Self Care       Patient Instructions:  Allergies as of 01/27/2023       Reactions   Hydrocodone Hives, Nausea And Vomiting   Metformin    Water blisters between fingers        Medication List     TAKE these  medications    albuterol 108 (90 Base) MCG/ACT inhaler Commonly known as: VENTOLIN HFA Inhale 1-2 puffs into the lungs every 6 (six) hours as needed for wheezing or shortness of breath.   aspirin EC 81 MG tablet Take 1 tablet (81 mg total) by mouth daily.   atorvastatin 80 MG tablet Commonly known as: LIPITOR Take 80 mg by mouth daily at 6 PM.   carvedilol 25 MG tablet Commonly known as: COREG Take 1 tablet (25 mg total) by mouth 2 (two) times daily.   cetirizine 10 MG tablet Commonly known as: ZYRTEC Take 10 mg by mouth daily as needed for allergies.   cyclobenzaprine 10 MG tablet Commonly known as: FLEXERIL Take 1 tablet (10 mg total) by mouth 2 (two) times daily as needed for muscle spasms.   diazepam 10 MG tablet Commonly known as: VALIUM Take 10 mg by mouth at bedtime.   Eliquis 5 MG Tabs tablet Generic drug: apixaban TAKE 1 TABLET BY MOUTH 2 TIMES A DAY   Entresto 24-26 MG Generic drug: sacubitril-valsartan TAKE 1 TABLET BY MOUTH TWICE DAILY   Farxiga 10 MG Tabs tablet Generic drug: dapagliflozin propanediol Take 10 mg by mouth daily.   furosemide 40 MG tablet Commonly known as: LASIX TAKE 1 TABLET BY MOUTH EACH DAY What changed: See the new instructions.   gabapentin 300 MG capsule Commonly known as: NEURONTIN Take 300 mg by mouth 3 (three) times daily.   insulin glargine 100 UNIT/ML Solostar Pen Commonly known as: LANTUS Inject 50 Units into the skin 2 (two) times daily.   insulin lispro  100 UNIT/ML injection Commonly known as: HUMALOG Inject 5-15 Units into the skin 3 (three) times daily as needed (blood sugar over 150).   naloxone 4 MG/0.1ML Liqd nasal spray kit Commonly known as: NARCAN Place 4 mg into the nose once as needed (overdose).   nitroGLYCERIN 0.4 MG SL tablet Commonly known as: NITROSTAT Place 0.4 mg under the tongue every 5 (five) minutes as needed for chest pain.   oxyCODONE-acetaminophen 10-325 MG tablet Commonly known as:  PERCOCET Take 1 tablet by mouth 4 (four) times daily.   pantoprazole 40 MG tablet Commonly known as: PROTONIX Take 40 mg by mouth daily.   rosuvastatin 20 MG tablet Commonly known as: CRESTOR Take 2 tablets (40 mg total) by mouth daily.   spironolactone 25 MG tablet Commonly known as: ALDACTONE Take 25 mg by mouth daily.   Trintellix 20 MG Tabs tablet Generic drug: vortioxetine HBr Take 20 mg by mouth daily in the afternoon.               Discharge Care Instructions  (From admission, onward)           Start     Ordered   01/27/23 0000  Discharge wound care:       Comments: You can remove the Prevena vac from your incision in 5-7 days when it begins peeling off. After the vac comes off, wash your incision daily with soap and water and keep dry.   01/27/23 0844           Activity: activity as tolerated, no driving while on analgesics, and no heavy lifting for 4 weeks Diet: low fat, low cholesterol diet Wound Care: keep wound clean and dry  Follow-up with [facility] in  2-3  weeks for incision check  Signed: Gerri Lins, PA-C 01/31/2023 1:49 PM [facility] Office: [phone]

## 2023-02-18 NOTE — Progress Notes (Unsigned)
No chief complaint on file.  History of Present Illness: 57 yo male with history of ongoing tobacco abuse, CAD, HLD, PAD, HLD, ischemic cardiomyopathy and insulin dependent diabetes mellitus who is here today for cardiac follow up. He had an anterior STEMI in January 2013 with cardiac arrest and found to have an occluded mid LAD treated with a drug eluting stent. The third OM branch was chronically occluded and the RCA had mild disease. His post MI LVEF was 35% but this was found to be normal on follow up echo. Cardiac cath in November 2017 with severe stent restenosis in the LAD, treated with a cutting balloon angioplasty. He did not keep his f/u appt in our office following his PCI. He has been non-compliant with follow up, medications over the past few years and continues to smoke. Admitted to [facility] in August 2018 with an anterior STEMI. His LAD was occluded proximal to the old stent and this was treated with a drug eluting stent. Angioplasty performed on the ostial Diagonal branch. LVEF was 40% following the MI. He had unstable angina in June 2020 and repeat cardiac cath showed severe restenosis in the mid LAD stented segment which was treated with cutting balloon angioplasty. He also had ostial stenosis of the diagonal branch (jailed by LAD stents), mild to moderate non-obstructive disease in the mid RCA and moderate LV systolic dysfunction with anteroapical hypokinesis (c/w findings on echo in 2018 at [facility]). LVEF=35-45%. He was placed on DAPT w/ ASA and Brilinta and continued on statin, ? blocker, ACEi and long acting nitrate. I saw him in May 2021 and he c/o chest pain c/w unstable angina. Cardiac cath 04/22/20 with progression of disease in the LAD and RCA. He underwent 3V CABG 05/25/20. (LIMA to LAD, SVG to Diagonal and SVG to RCA). His post-operative course was complicated by atrial fibrillation and he was started on amiodarone. Echo with LVEF=30-35% with apical thrombus.  He was discharged on Eliquis with a Lifevest. Echo post bypass with LVEF=40-45%. He was seen in our office 02/22/22 by Dr. Ali Lowe and reported chest pain. Imdur was started. He was then admitted to [facility] with chest pain and NSTEMI. Cardiac cath at [facility] April 2023 with severe disease in the SVG to RCA treated with a drug eluting stent. He was then admitted to [facility] 03/30/22 with syncope x 3 and was Imdur was stopped. He was felt to be volume overloaded with c/o LE edema and abdominal swelling and was diuresed with Lasix. Echo 03/30/22 with LVEF=30-35% with apical akinesis. No LV thrombus. He was seen in the Advanced Heart Failure clinic in May 2023 and most recently in January 2024. Last echo August 2023 with LVEF=45-50%. Cardiac monitor May 2023 with 6 beats of VT, sinus, rare PACs.   His PAD is followed in [facility] by Dr. Trula Slade. In 2013 he was found to have occlusion of his distal aorta and left subclavian artery. He underwent aortobifemoral bypass in February 2014 and left carotid to subclavian bypass in May 2014.  In April 2019, he underwent right femoral to AK popliteal bypass with saphenous vein and right femoral endarterectomy. In July 2022 he had drug coated balloon angioplasty of the right common femoral artery. He underwent repeat drug coated balloon angioplasty of the right common femoral artery in January 2024 and then left femoral endarterectomy in February 2024.   He is here today for follow up. The patient denies any chest pain, dyspnea, palpitations,  lower extremity edema, orthopnea, PND, dizziness, near syncope or syncope.   Primary Care Physician: Nolene Ebbs, MD  Past Medical History:  Diagnosis Date   Acute myocardial infarction of other anterior wall, subsequent episode of care 12/12/2011   Anal fissure    Arthritis    back   Atherosclerosis of native artery of both lower extremities with intermittent claudication ([place]) 04/01/2018   Bruises easily    d/t  being on Effient   CAD (coronary artery disease)    A. Acute Ant STEMI 12/12/2011    Cardiomyopathy secondary [id number]   Chronic systolic CHF (congestive heart failure) ([place])    ischemic CM // Echo 6/21: EF 30-35  //  Echocardiogram 10/21: LV apical thrombus resolved, EF 50-55, ant and apical, ant-lat HK, trivial MR, trivial AI   Chronic total occlusion of artery of the extremities ([place]) 02/26/2012   Claudication ([place]) 12/26/2011   Coronary atherosclerosis of native coronary artery    a. ant STEMI with cardiac arrest 2013 s/p DES to mLAD. b. [place] 10/2016 s/p PTCA to mLAD.   Diabetes mellitus without complication ([place])    Essential hypertension 10/23/2016   GERD (gastroesophageal reflux disease)    "takes tums"   History of blood transfusion    no abnormal reaction noted   History of kidney stones    Hypertension    Ischemic cardiomyopathy    a. EF 35% in 02/2012 at time of acute MI, improved to normal on subsequent imaging.   MI (myocardial infarction) ([place])    AMI [id number] - complicated by VT/[place]   Mixed hyperlipidemia    Numbness and tingling of right arm 03/31/2013   Occlusion and stenosis of carotid artery without mention of cerebral infarction 08/26/2012   PAD (peripheral artery disease) ([place])    followed by [facility] - aortobifem bypass 01/2013 & left carotid-subclavian artery bypass 04/2013   Peripheral vascular disease, unspecified ([place]) 12/16/2012   PVD (peripheral vascular disease) ([place]) 03/31/2013   Rectal polyp    Subclavian steal syndrome 08/11/2013   Uncontrolled diabetes mellitus 10/23/2016   Wears partial dentures    upper    Past Surgical History:  Procedure Laterality Date   ABDOMINAL AORTOGRAM W/LOWER EXTREMITY N/A 06/26/2017   Procedure: Abdominal Aortogram w/Lower Extremity;  Surgeon: Serafina Mitchell, MD;  Location: [place] [facility];  Service: Cardiovascular;  Laterality: N/A;   ABDOMINAL AORTOGRAM W/LOWER EXTREMITY N/A 06/28/2021   Procedure: ABDOMINAL AORTOGRAM  W/LOWER EXTREMITY;  Surgeon: Serafina Mitchell, MD;  Location: [place] [facility];  Service: Cardiovascular;  Laterality: N/A;   ABDOMINAL AORTOGRAM W/LOWER EXTREMITY N/A 01/02/2023   Procedure: ABDOMINAL AORTOGRAM W/LOWER EXTREMITY;  Surgeon: Serafina Mitchell, MD;  Location: [place] [facility];  Service: Cardiovascular;  Laterality: N/A;   ANAL FISSURECTOMY     AORTA - BILATERAL FEMORAL ARTERY BYPASS GRAFT N/A 01/16/2013   Procedure: AORTA BIFEMORAL BYPASS GRAFT;  Surgeon: Serafina Mitchell, MD;  Location: [place];  Service: Vascular;  Laterality: N/A;   APPENDECTOMY     CARDIAC CATHETERIZATION N/A 10/23/2016   Procedure: Left Heart Cath and Coronary Angiography;  Surgeon: Burnell Blanks, MD;  Location: [place] [facility];  Service: Cardiovascular;  Laterality: N/A;   CARDIAC CATHETERIZATION N/A 10/23/2016   Procedure: Coronary Balloon Angioplasty;  Surgeon: Burnell Blanks, MD;  Location: [place] [facility];  Service: Cardiovascular;  Laterality: N/A;   CAROTID-SUBCLAVIAN BYPASS GRAFT Left 04/03/2013   Procedure: BYPASS GRAFT CAROTID-SUBCLAVIAN;  Surgeon: Serafina Mitchell, MD;  [facility]: [facility];  Service: Vascular;  Laterality: Left;   CIRCUMCISION N/A 12/15/2015   Procedure: CIRCUMCISION ADULT;  Surgeon: Cleon Gustin, MD;  Location: [facility];  Service: Urology;  Laterality: N/A;   COLONOSCOPY     CORONARY ANGIOPLASTY     stent placed Dec 12, 2011 and 2018   CORONARY ARTERY BYPASS GRAFT N/A 05/05/2020   Procedure: CORONARY ARTERY BYPASS GRAFTING (CABG) x Three, Using Left internal Mammary Artery and Left Leg greater saphenous vein harvested endoscopically;  Surgeon: Grace Isaac, MD;  Location: [place];  Service: Open Heart Surgery;  Laterality: N/A;   CORONARY BALLOON ANGIOPLASTY N/A 05/23/2019   Procedure: CORONARY BALLOON ANGIOPLASTY;  Surgeon: Burnell Blanks, MD;  Location: [place] [facility];  Service: Cardiovascular;  Laterality: N/A;   DIAGNOSTIC LAPAROSCOPY      ENDARTERECTOMY FEMORAL Right 03/13/2018   Procedure: FEMORAL ENDARTERECTOMY RIGHT;  Surgeon: Serafina Mitchell, MD;  [facility];  Service: Vascular;  Laterality: Right;   ENDARTERECTOMY FEMORAL Left 01/26/2023   Procedure: REDO LEFT COMMON FEMORAL ENDARTERECTOMY WITH VPA;  Surgeon: Serafina Mitchell, MD;  Location: [facility] OR;  Service: Vascular;  Laterality: Left;   FEMORAL-POPLITEAL BYPASS GRAFT Right 03/13/2018   Procedure: FEMORAL-POPLITEAL ARTERY BYPASS WITH NON-REVERSE VEIN RIGHT;  Surgeon: Serafina Mitchell, MD;  [facility];  Service: Vascular;  Laterality: Right;   GROIN DISSECTION Right 03/13/2018   Procedure: RE-DO COMMON FEMORAL ARTERY EXPOSURE;  Surgeon: Serafina Mitchell, MD;  [facility];  Service: Vascular;  Laterality: Right;   I & D EXTREMITY Left 04/18/2013   Procedure: debridement of left neck lymphocele;  Surgeon: Serafina Mitchell, MD;  Location: [place];  Service: Vascular;  Laterality: Left;  I and D of left neck   I & D EXTREMITY Right 11/21/2014   Procedure: IRRIGATION AND DEBRIDEMENT RIGHT HAND;  Surgeon: Roseanne Kaufman, MD;  Location: [place];  Service: Orthopedics;  Laterality: Right;   INTRAVASCULAR PRESSURE WIRE/FFR STUDY N/A 04/22/2020   Procedure: INTRAVASCULAR PRESSURE WIRE/FFR STUDY;  Surgeon: Burnell Blanks, MD;  Location: [place] [facility];  Service: Cardiovascular;  Laterality: N/A;   LEFT HEART CATH AND CORONARY ANGIOGRAPHY N/A 05/23/2019   Procedure: LEFT HEART CATH AND CORONARY ANGIOGRAPHY;  Surgeon: Burnell Blanks, MD;  Location: [place] [facility];  Service: Cardiovascular;  Laterality: N/A;   LEFT HEART CATH AND CORONARY ANGIOGRAPHY N/A 04/22/2020   Procedure: LEFT HEART CATH AND CORONARY ANGIOGRAPHY;  Surgeon: Burnell Blanks, MD;  Location: [place] [facility];  Service: Cardiovascular;  Laterality: N/A;   LEFT HEART CATHETERIZATION WITH CORONARY ANGIOGRAM N/A 12/12/2011   Procedure: LEFT HEART CATHETERIZATION WITH CORONARY ANGIOGRAM;   Surgeon: Burnell Blanks, MD;  Location: [facility] CATH LAB;  Service: Cardiovascular;  Laterality: N/A;   LOWER EXTREMITY ANGIOGRAM N/A 01/31/2012   Procedure: LOWER EXTREMITY ANGIOGRAM;  Surgeon: Burnell Blanks, MD;  Location: [facility] CATH LAB;  Service: Cardiovascular;  Laterality: N/A;   LOWER EXTREMITY ANGIOGRAPHY N/A 04/23/2018   Procedure: LOWER EXTREMITY ANGIOGRAPHY;  Surgeon: Serafina Mitchell, MD;  Location: [place] [facility];  Service: Cardiovascular;  Laterality: N/A;   PATCH ANGIOPLASTY Left 01/26/2023   Procedure: PATCH ANGIOPLASTY USING HEMASHIELD PLATINUM FINESSE;  Surgeon: Serafina Mitchell, MD;  [facility];  Service: Vascular;  Laterality: Left;   PERCUTANEOUS CORONARY STENT INTERVENTION (PCI-S) Right 12/12/2011   Procedure: PERCUTANEOUS CORONARY STENT INTERVENTION (PCI-S);  Surgeon: Burnell Blanks, MD;  Location: [facility] CATH LAB;  Service: Cardiovascular;  Laterality: Right;   PERIPHERAL VASCULAR BALLOON ANGIOPLASTY Right 06/28/2021   Procedure: PERIPHERAL VASCULAR BALLOON ANGIOPLASTY;  Surgeon: Serafina Mitchell, MD;  Location: [place] [facility];  Service: Cardiovascular;  Laterality: Right;  common femoral (graft)   PERIPHERAL VASCULAR BALLOON ANGIOPLASTY  01/02/2023   Procedure: PERIPHERAL VASCULAR BALLOON ANGIOPLASTY;  Surgeon: Serafina Mitchell, MD;  Location: [place] [facility];  Service: Cardiovascular;;   PERIPHERAL VASCULAR INTERVENTION Left 04/23/2018   Procedure: PERIPHERAL VASCULAR INTERVENTION;  Surgeon: Serafina Mitchell, MD;  Location: [place] [facility];  Service: Cardiovascular;  Laterality: Left;  superficial femoral   REPAIR EXTENSOR TENDON Right 11/21/2014   Procedure: WITH REPAIR/RECONSTRUCTION OF EXTENSOR TENDONS AS NEEDED;  Surgeon: Roseanne Kaufman, MD;  Location: [place];  Service: Orthopedics;  Laterality: Right;   TEE WITHOUT CARDIOVERSION N/A 05/05/2020   Procedure: TRANSESOPHAGEAL ECHOCARDIOGRAM (TEE);  Surgeon: Grace Isaac, MD;  Location: [place];   Service: Open Heart Surgery;  Laterality: N/A;    Current Outpatient Medications  Medication Sig Dispense Refill   albuterol (VENTOLIN HFA) 108 (90 Base) MCG/ACT inhaler Inhale 1-2 puffs into the lungs every 6 (six) hours as needed for wheezing or shortness of breath.     aspirin EC 81 MG tablet Take 1 tablet (81 mg total) by mouth daily. 90 tablet 3   atorvastatin (LIPITOR) 80 MG tablet Take 80 mg by mouth daily at 6 PM.     carvedilol (COREG) 25 MG tablet Take 1 tablet (25 mg total) by mouth 2 (two) times daily. 180 tablet 3   cetirizine (ZYRTEC) 10 MG tablet Take 10 mg by mouth daily as needed for allergies.     cyclobenzaprine (FLEXERIL) 10 MG tablet Take 1 tablet (10 mg total) by mouth 2 (two) times daily as needed for muscle spasms. 30 tablet 0   diazepam (VALIUM) 10 MG tablet Take 10 mg by mouth at bedtime.     ELIQUIS 5 MG TABS tablet TAKE 1 TABLET BY MOUTH 2 TIMES A DAY 180 tablet 1   ENTRESTO 24-26 MG TAKE 1 TABLET BY MOUTH TWICE DAILY 60 tablet 0   FARXIGA 10 MG TABS tablet Take 10 mg by mouth daily.     furosemide (LASIX) 40 MG tablet TAKE 1 TABLET BY MOUTH EACH DAY (Patient taking differently: Take 40 mg by mouth 2 (two) times daily.) 90 tablet 3   gabapentin (NEURONTIN) 300 MG capsule Take 300 mg by mouth 3 (three) times daily.     insulin glargine (LANTUS) 100 UNIT/ML Solostar Pen Inject 50 Units into the skin 2 (two) times daily.     insulin lispro (HUMALOG) 100 UNIT/ML injection Inject 5-15 Units into the skin 3 (three) times daily as needed (blood sugar over 150).     naloxone (NARCAN) nasal spray 4 mg/0.1 mL Place 4 mg into the nose once as needed (overdose).     nitroGLYCERIN (NITROSTAT) 0.4 MG SL tablet Place 0.4 mg under the tongue every 5 (five) minutes as needed for chest pain.     oxyCODONE-acetaminophen (PERCOCET) 10-325 MG tablet Take 1 tablet by mouth 4 (four) times daily.     pantoprazole (PROTONIX) 40 MG tablet Take 40 mg by mouth daily.     rosuvastatin (CRESTOR)  20 MG tablet Take 2 tablets (40 mg total) by mouth daily. (Patient not taking: Reported on 01/19/2023) 180 tablet 3   spironolactone (ALDACTONE) 25 MG tablet Take 25 mg by mouth daily.     TRINTELLIX 20 MG  TABS tablet Take 20 mg by mouth daily in the afternoon.     No current facility-administered medications for this visit.    Allergies  Allergen Reactions   Hydrocodone Hives and Nausea And Vomiting   Metformin     Water blisters between fingers    Social History   Socioeconomic History   Marital status: Divorced    Spouse name: Not on file   Number of children: 1   Years of education: Not on file   Highest education level: High school graduate  Occupational History    Employer: OTHER   Occupation: Disablity    Comment: Since 2013  Tobacco Use   Smoking status: Some Days    Packs/day: 0.25    Years: 25.00    Additional pack years: 0.00    Total pack years: 6.25    Types: Cigarettes   Smokeless tobacco: Never   Tobacco comments:    Smoking cessation  Vaping Use   Vaping Use: Never used  Substance and Sexual Activity   Alcohol use: Yes    Alcohol/week: 2.0 standard drinks of alcohol    Types: 2 Cans of beer per week    Comment: socially   Drug use: No   Sexual activity: Yes  Other Topics Concern   Not on file  Social History Narrative   Disabled - no longer works   Social Determinants of Radio broadcast assistant Strain: [place]  (03/30/2022)   Overall Financial Resource Strain (CARDIA)    Difficulty of Paying Living Expenses: Not very hard  Food Insecurity: No Food Insecurity (03/30/2022)   Hunger Vital Sign    Worried About Running Out of Food in the Last Year: Never true    Ran Out of Food in the Last Year: Never true  Transportation Needs: No Transportation Needs (03/30/2022)   PRAPARE - Hydrologist (Medical): No    Lack of Transportation (Non-Medical): No  Physical Activity: Not on file  Stress: Not on file  Social  Connections: Not on file  Intimate Partner Violence: Not on file    Family History  Problem Relation Age of Onset   Cancer Father        Lung   Diabetes Mother    COPD Mother    Cancer Maternal Uncle        Colon    Review of Systems:  As stated in the HPI and otherwise negative.   There were no vitals taken for this visit.  Physical Examination: General: Well developed, well nourished, NAD  HEENT: OP clear, mucus membranes moist  SKIN: warm, dry. No rashes. Neuro: No focal deficits  Musculoskeletal: Muscle strength 5/5 all ext  Psychiatric: Mood and affect normal  Neck: No JVD, no carotid bruits, no thyromegaly, no lymphadenopathy.  Lungs:Clear bilaterally, no wheezes, rhonci, crackles Cardiovascular: Regular rate and rhythm. No murmurs, gallops or rubs. Abdomen:Soft. Bowel sounds present. Non-tender.  Extremities: No lower extremity edema. Pulses are 2 + in the bilateral DP/PT.  EKG:  EKG is not *** ordered today. The ekg ordered today demonstrates   Echo August 2023:  1. Left ventricular ejection fraction, by estimation, is 45 to 50%. The  left ventricle has mildly decreased function. The left ventricle  demonstrates regional wall motion abnormalities (see scoring  diagram/findings for description). Left ventricular  diastolic parameters are consistent with Grade II diastolic dysfunction  (pseudonormalization). Elevated left ventricular end-diastolic pressure.   2. Right ventricular systolic function is normal.  The right ventricular  size is normal. There is normal pulmonary artery systolic pressure.   3. Left atrial size was mildly dilated.   4. The mitral valve is normal in structure. No evidence of mitral valve  regurgitation. No evidence of mitral stenosis.   5. The aortic valve is tricuspid. There is mild calcification of the  aortic valve. There is mild thickening of the aortic valve. Aortic valve  regurgitation is trivial. No aortic stenosis is present.   6.  The inferior vena cava is normal in size with greater than 50%  respiratory variability, suggesting right atrial pressure of 3 mmHg.   Recent Labs: 03/29/2022: B Natriuretic Peptide 625.1 01/22/2023: ALT 25 01/27/2023: BUN 17; Creatinine, Ser 1.36; Hemoglobin 14.2; Platelets 163; Potassium 4.0; Sodium 129   Lipid Panel Lipid Panel     Component Value Date/Time   CHOL 164 12/20/2022 0945   CHOL 157 06/03/2020 1144   TRIG 207 (H) 12/20/2022 0945   HDL 25 (L) 12/20/2022 0945   HDL 30 (L) 06/03/2020 1144   CHOLHDL 6.6 12/20/2022 0945   VLDL 41 (H) 12/20/2022 0945   LDLCALC 98 12/20/2022 0945   LDLCALC 83 06/03/2020 1144   LABVLDL 44 (H) 06/03/2020 1144     Wt Readings from Last 3 Encounters:  01/26/23 104.3 kg  01/22/23 104.3 kg  01/02/23 103.9 kg    Assessment and Plan:   1. CAD s/p CABG without angina: Most recent cardiac cath in April 2023 at [facility] with stenting of the SVG to RCA. NO chest pain suggestive of angina. Will continue ASA, statin and beta blocker.     2. PAD: Followed in [facility].   3. Tobacco abuse: He is still smoking 1/2 ppd  4. Hyperlipidemia: Lipids followed in primary care. LDL ***. Continue statin  5. Chronic systolic CHF/Ischemic cardiomyopathy: LVEF 45-50% by echo in August 2023. Weight is stable. No volume overload on exam Continue beta blocker. Entresto, aldactone and Lasix.     6. History of LV thrombus: Continue Eliquis  7. Atrial fibrillation, paroxysmal: Sinus today. Continue beta blocker and Eliquis.    Current medicines are reviewed at length with the patient today.  The patient does not have concerns regarding medicines.  The following changes have been made:  no change  Labs/ tests ordered today include:   No orders of the defined types were placed in this encounter.   Disposition:   F/U with me in 4-6  months  Signed, [name], MD 02/18/2023 11:11 AM    [address],  [place],   [address] Phone: [phone]; Fax: [phone]

## 2023-02-19 ENCOUNTER — Ambulatory Visit: Payer: Medicaid Other | Attending: Cardiovascular Disease | Admitting: Cardiovascular Disease

## 2023-02-19 ENCOUNTER — Encounter: Payer: Self-pay | Admitting: Cardiovascular Disease

## 2023-02-19 VITALS — BP 84/60 | HR 67 | Ht 69.0 in | Wt 222.8 lb

## 2023-02-19 DIAGNOSIS — E785 Hyperlipidemia, unspecified: Secondary | ICD-10-CM

## 2023-02-19 DIAGNOSIS — I513 Intracardiac thrombosis, not elsewhere classified: Secondary | ICD-10-CM

## 2023-02-19 DIAGNOSIS — I255 Ischemic cardiomyopathy: Secondary | ICD-10-CM

## 2023-02-19 DIAGNOSIS — I480 Paroxysmal atrial fibrillation: Secondary | ICD-10-CM | POA: Diagnosis not present

## 2023-02-19 DIAGNOSIS — I739 Peripheral vascular disease, unspecified: Secondary | ICD-10-CM | POA: Diagnosis not present

## 2023-02-19 DIAGNOSIS — I2510 Atherosclerotic heart disease of native coronary artery without angina pectoris: Secondary | ICD-10-CM

## 2023-02-19 DIAGNOSIS — I5022 Chronic systolic (congestive) heart failure: Secondary | ICD-10-CM

## 2023-02-19 DIAGNOSIS — E1169 Type 2 diabetes mellitus with other specified complication: Secondary | ICD-10-CM

## 2023-02-19 MED ORDER — CARVEDILOL 12.5 MG PO TABS: 12.5000 mg | ORAL_TABLET | Freq: Two times a day (BID) | ORAL | 3 refills | Status: DC

## 2023-02-19 NOTE — Patient Instructions (Signed)
Medication Instructions:  Your physician has recommended you make the following change in your medication:  1.) decrease carvedilol (Coreg)to 12.5 mg - one tablet twice a day  *If you need a refill on your cardiac medications before your next appointment, please call your pharmacy*   Lab Work: none If you have labs (blood work) drawn today and your tests are completely normal, you will receive your results only by: [place] (if you have MyChart) OR A paper copy in the mail If you have any lab test that is abnormal or we need to change your treatment, we will call you to review the results.   Testing/Procedures: none   Follow-Up: At [facility], you and your health needs are our priority.  As part of our continuing mission to provide you with exceptional heart care, we have created designated Provider Care Teams.  These Care Teams include your primary Cardiologist (physician) and Advanced Practice Providers (APPs -  Physician Assistants and Nurse Practitioners) who all work together to provide you with the care you need, when you need it.  We recommend signing up for the patient portal called "MyChart".  Sign up information is provided on this After Visit Summary.  MyChart is used to connect with patients for Virtual Visits (Telemedicine).  Patients are able to view lab/test results, encounter notes, upcoming appointments, etc.  Non-urgent messages can be sent to your provider as well.   To learn more about what you can do with MyChart, go to [email or web address].    Your next appointment:   6 month(s)  Provider:   [name], MD

## 2023-03-01 ENCOUNTER — Other Ambulatory Visit (HOSPITAL_COMMUNITY): Payer: Self-pay | Admitting: Adult Health

## 2023-03-14 ENCOUNTER — Other Ambulatory Visit (HOSPITAL_COMMUNITY): Payer: Self-pay

## 2023-03-26 ENCOUNTER — Ambulatory Visit: Payer: Medicaid Other | Attending: Physician Assistant | Admitting: Physician Assistant

## 2023-03-26 ENCOUNTER — Encounter: Payer: Self-pay | Admitting: Physician Assistant

## 2023-03-26 VITALS — BP 96/60 | HR 77 | Ht 69.0 in | Wt 220.8 lb

## 2023-03-26 DIAGNOSIS — Z720 Tobacco use: Secondary | ICD-10-CM

## 2023-03-26 DIAGNOSIS — I214 Non-ST elevation (NSTEMI) myocardial infarction: Secondary | ICD-10-CM

## 2023-03-26 DIAGNOSIS — I5020 Unspecified systolic (congestive) heart failure: Secondary | ICD-10-CM | POA: Diagnosis not present

## 2023-03-26 DIAGNOSIS — I2510 Atherosclerotic heart disease of native coronary artery without angina pectoris: Secondary | ICD-10-CM | POA: Diagnosis not present

## 2023-03-26 DIAGNOSIS — E7800 Pure hypercholesterolemia, unspecified: Secondary | ICD-10-CM

## 2023-03-26 DIAGNOSIS — I739 Peripheral vascular disease, unspecified: Secondary | ICD-10-CM

## 2023-03-26 DIAGNOSIS — I513 Intracardiac thrombosis, not elsewhere classified: Secondary | ICD-10-CM

## 2023-03-26 DIAGNOSIS — I255 Ischemic cardiomyopathy: Secondary | ICD-10-CM

## 2023-03-26 HISTORY — DX: Non-ST elevation (NSTEMI) myocardial infarction: I21.4

## 2023-03-26 MED ORDER — EZETIMIBE 10 MG PO TABS: 10.0000 mg | ORAL_TABLET | Freq: Every day | ORAL | 11 refills | Status: DC

## 2023-03-26 MED ORDER — CLOPIDOGREL BISULFATE 75 MG PO TABS: 75.0000 mg | ORAL_TABLET | Freq: Every day | ORAL | 11 refills | Status: DC

## 2023-03-26 NOTE — Progress Notes (Signed)
Cardiology Office Note:    Date:  03/26/2023  ID:  Ryan [name], DOB [birth year], MRN [id number] PCP: Fleet Contras, MD  [facility] Providers Cardiologist:  [name], MD       Patient Profile:   Coronary artery disease S/p anterior STEMI in 1/13 c/b cardiac arrest >> DES to LAD; OM1 w 100 CTO S/p multiple PCIs (POBA to LAD in 11/17, DES to LAD and POBA to Dx in 8/18 ISO Ant STEMI, POBA to LAD in 05/2019) Cath 05/2020: severe LAD restenosis >> s/p CABG S/p NSTEMI in 03/2022 ([facility]) s/p DES to S-RCA Admx w syncope in 03/2022 >> Imdur stopped  NSTEMI s/p POBA to S-RCA 2/2 ISR LHC 03/14/2023 ([facility]): LAD proximal/mid stent patent with 50 ISR, LIMA-LAD atretic, D1 jailed, SVG-D1 patent, LCx distal 100, RCA mid tandem 90, 80, SVG-distal RCA stent with 90 ISR Peripheral Arterial Disease (Dr. Myra Gianotti)  Distal aorta occlusion and L subclavian artery s/p aorto-bifem bypass in 2014 S/p L carotid to subclavian bypass in 5/14 R fem to AK popliteal bypass and R fem endarterectomy  S/p angioplasty to the right CFA in 06/2021 S/p angioplasty of the right CFA in 12/2022 S/p L femoral endarterectomy in 01/2023  Hyperlipidemia  HFmrEF (heart failure with mildly reduced ejection fraction); Ischemic CM Post MI EF 35; FU echocardiogram with normal EF        EF has improved and decreased (EF 30-35 >> EF 50-55) TTE 09/14/2020: EF 50-55 TTE 03/10/2022: EF 30-35 TTE 07/04/2022: EF 45-50, GR 2 DD, normal RVSF, normal PASP, mild LAE, trivial AI, RAP 3, anterior/apical HK TTE 03/15/2023 ([facility]): Anterior akinesis, EF 30-35, mild LVH, mild AI, mild MR LV apical thrombus Noted on echocardiogram post CABG 05/2020 >> chronic Apixaban Rx Post op AFib Amiodarone >> DC'd Carotid artery disease Carotid US 11/06/2022: Bilateral ICA 1-39,?  Occluded R VA, right subclavian stenosis Diabetes mellitus, insulin dependent  Tobacco abuse Monitor 04/17/2022: NSVT, rare PACs, NSR     History of Present Illness:   Ryan [name] is a 57 y.o. male who returns for posthospitalization follow-up.  He was last seen by Dr. [name] 02/19/2023.  He was [facility] 4/9-4/11 with a non-STEMI. Peak hsTrop was 2934. Echocardiogram demonstrated EF 30-35.  Cardiac catheterization demonstrated 90% in-stent restenosis in the vein graft to the RCA which was treated with angioplasty.  Since discharge from the hospital, he has not had any further angina.  His anginal equivalent has been right arm discomfort.  He has chronic shortness of breath over the past year.  He reports NYHA IIb-III symptoms.  He sleeps on 2 pillows chronically.  He has not had PND or lower extremity edema.  He has not had syncope but continues to have dizziness with standing.  He continues to smoke.  Review of Systems  Respiratory:  Positive for cough (chronic). Negative for hemoptysis.   Gastrointestinal:  Negative for hematochezia and melena.  Genitourinary:  Negative for hematuria.  see HPI.    Studies Reviewed:    EKG: NSR, HR 77, normal axis, anterolateral Q waves, T wave inversions 1, aVL, QTc 457, similar to prior tracings  Risk Assessment/Calculations:    CHA2DS2-VASc Score = 4   This indicates a 4.8% annual risk of stroke. The patient's score is based upon: CHF History: 1 HTN History: 1 Diabetes History: 1 Stroke History: 0 Vascular Disease History: 1 Age Score: 0 Gender Score: 0  Physical Exam:   VS:  BP 96/60   Pulse 77   Ht  (1.753 m)   Wt 220 lb 12.8 oz (100.2 kg)   SpO2 97%   BMI 32.61 kg/m    Wt Readings from Last 3 Encounters:  03/26/23 220 lb 12.8 oz (100.2 kg)  02/19/23 222 lb 12.8 oz (101.1 kg)  01/26/23 229 lb 15 oz (104.3 kg)    Constitutional:      Appearance: Healthy appearance. Not in distress.  Neck:     Vascular: JVD normal.  Pulmonary:     Breath sounds: Normal breath sounds. No wheezing. No rales.  Cardiovascular:     Normal rate.  Regular rhythm. Normal S1. Normal S2.      Murmurs: There is no murmur.     Comments: Right wrist without hematoma Edema:    Peripheral edema absent.  Abdominal:     Palpations: Abdomen is soft.       ASSESSMENT AND PLAN:   NSTEMI (non-ST elevation myocardial infarction) History of multiple myocardial infarctions and PCI's in the past.  He underwent CABG in 2021.  He had a non-STEMI in 4/23 treated with a DES to the SVG-RCA.  Most recently, he was admitted to [facility] with a non-STEMI which was treated with balloon angioplasty to the SVG-RCA secondary to in-stent restenosis.  He is currently on aspirin, Plavix, Eliquis.  I have recommended that he continue triple therapy for 30 days then stop aspirin and remain on Plavix and Eliquis.  He is doing well without recurrent anginal symptoms. Continue ASA 81 mg daily x 30 days post PCI, then DC Continue Eliquis 5 mg twice daily, statin, carvedilol 12.5 mg twice daily, as needed nitroglycerin DC smoking Follow-up 3 months  PVD (peripheral vascular disease) Status post multiple revascularization procedures.  Continue follow-up with vascular surgery as planned.  Left ventricular apical thrombus Most recent echocardiograms have demonstrated resolution of his thrombus.  He has been maintained on long-term anticoagulation therapy.  Labs from [facility] were reviewed.  His hemoglobin was normal during his most recent hospitalization at 14.3.  Creatinine was normal at 0.9.  Continue Eliquis 5 mg twice daily.  HFrEF (heart failure with reduced ejection fraction) Ischemic cardiomyopathy.  His dyspnea is likely multifactorial and related to ongoing tobacco use and COPD as well as CAD, CHF.  Volume status appears stable on exam.  He is NYHA III.  He has been having difficulty with symptomatic hypotension with dizziness, especially upon standing.  His carvedilol was reduced at last office visit.  I have recommended decreasing his Lasix.  He  knows to continue to monitor his weights closely and adjust his Lasix if needed.  If his hypotension and dizziness does not improve with this, we will need to consider decreasing spironolactone or changing Entresto to an ARB. Continue carvedilol 12.5 mg twice daily, Farxiga 10 mg daily, Entresto 24/26 mg twice daily, spironolactone 25 mg daily Decrease furosemide to 40 mg daily Weigh daily Take extra furosemide 40 mg if weight increases 3 pounds in 1 day, 5 pounds in 1 week or patient notes increased swelling/dyspnea Obtain BMET 1 week Follow-up 3 months  Pure hypercholesterolemia Labs from his recent admission to [facility] were reviewed.  His LDL was above goal at 92 and triglycerides were elevated at 238.  We discussed the importance of aggressive lipid management to reduce further risk of ACS.  He has atorvastatin and rosuvastatin on his medication list.  We will confirm which when he is taking.  I have recommended adding ezetimibe 10 mg daily.  Obtain follow-up CMET, lipids in 6 to 8 weeks.  Ischemic cardiomyopathy His ejection fraction is reduced again to 30-35 by echocardiogram from [facility] in April 2024.  Arrange follow-up limited echo in 90 days to reassess EF.  If EF remains <35, refer to EP for ICD.  Tobacco abuse We discussed the importance of quitting smoking to reduce further risk of ACS.        Dispo:  Return in about 3 months (around 06/25/2023) for Routine follow up in 3 months with Dr. [name], or Tereso Newcomer, PA-C.  Signed, Tereso Newcomer, PA-C

## 2023-03-26 NOTE — Assessment & Plan Note (Signed)
We discussed the importance of quitting smoking to reduce further risk of ACS.

## 2023-03-26 NOTE — Patient Instructions (Signed)
Medication Instructions:   DISCONTINUE Asprin on Apr 13, 2023.  DECREASE Lasix one (1) tablet by mouth (40 mg) daily. Can take extra tablet for wt gain of 3 lbs in 24 hour or 5 lbs in one week.   *If you need a refill on your cardiac medications before your next appointment, please call your pharmacy*   Lab Work:  Your physician recommends that you return for lab work on Friday, May 3. You can come in on the day of your appointment anytime between 7:30-4:30.  Your physician recommends that you return for a FASTING lipid profile/cmet on Friday, June 14. You can come in on the day of your appointment anytime between 7:30-4:30 fasting from midnight the night before.     If you have labs (blood work) drawn today and your tests are completely normal, you will receive your results only by: MyChart Message (if you have MyChart) OR A paper copy in the mail If you have any lab test that is abnormal or we need to change your treatment, we will call you to review the results.   Testing/Procedures:  Your physician has requested that you have an echocardiogram. Echocardiography is a painless test that uses sound waves to create images of your heart. It provides your doctor with information about the size and shape of your heart and how well your heart's chambers and valves are working. This procedure takes approximately one hour. There are no restrictions for this procedure. Please do NOT wear cologne,  aftershave, or lotions (deodorant is allowed). Please arrive 15 minutes prior to your appointment time.   Follow-Up: At [facility], you and your health needs are our priority.  As part of our continuing mission to provide you with exceptional heart care, we have created designated Provider Care Teams.  These Care Teams include your primary Cardiologist (physician) and Advanced Practice Providers (APPs -  Physician Assistants and Nurse Practitioners) who all work together to provide you  with the care you need, when you need it.  We recommend signing up for the patient portal called "MyChart".  Sign up information is provided on this After Visit Summary.  MyChart is used to connect with patients for Virtual Visits (Telemedicine).  Patients are able to view lab/test results, encounter notes, upcoming appointments, etc.  Non-urgent messages can be sent to your provider as well.   To learn more about what you can do with MyChart, go to [email or web address].    Your next appointment:   3 month(s)  Provider:   Tereso Newcomer, PA-C         Other Instructions  Recommend weighing daily and keeping a log. Please call our office if you have weight gain of 3 pounds overnight or 5 pounds in 1 week take one extra lasix.  Date  Time Weight

## 2023-03-26 NOTE — Assessment & Plan Note (Signed)
His ejection fraction is reduced again to 30-35 by echocardiogram from [facility] in April 2024.  Arrange follow-up limited echo in 90 days to reassess EF.  If EF remains <35, refer to EP for ICD.

## 2023-03-26 NOTE — Assessment & Plan Note (Addendum)
Ischemic cardiomyopathy.  His dyspnea is likely multifactorial and related to ongoing tobacco use and COPD as well as CAD, CHF.  Volume status appears stable on exam.  He is NYHA III.  He has been having difficulty with symptomatic hypotension with dizziness, especially upon standing.  His carvedilol was reduced at last office visit.  I have recommended decreasing his Lasix.  He knows to continue to monitor his weights closely and adjust his Lasix if needed.  If his hypotension and dizziness does not improve with this, we will need to consider decreasing spironolactone or changing Entresto to an ARB. Continue carvedilol 12.5 mg twice daily, Farxiga 10 mg daily, Entresto 24/26 mg twice daily, spironolactone 25 mg daily Decrease furosemide to 40 mg daily Weigh daily Take extra furosemide 40 mg if weight increases 3 pounds in 1 day, 5 pounds in 1 week or patient notes increased swelling/dyspnea Obtain BMET 1 week Follow-up 3 months

## 2023-03-26 NOTE — Assessment & Plan Note (Signed)
Labs from his recent admission to [facility] were reviewed.  His LDL was above goal at 92 and triglycerides were elevated at 238.  We discussed the importance of aggressive lipid management to reduce further risk of ACS.  He has atorvastatin and rosuvastatin on his medication list.  We will confirm which when he is taking.  I have recommended adding ezetimibe 10 mg daily.  Obtain follow-up CMET, lipids in 6 to 8 weeks.

## 2023-03-26 NOTE — Assessment & Plan Note (Signed)
History of multiple myocardial infarctions and PCI's in the past.  He underwent CABG in 2021.  He had a non-STEMI in 4/23 treated with a DES to the SVG-RCA.  Most recently, he was admitted to [facility] in [facility] with a non-STEMI which was treated with balloon angioplasty to the SVG-RCA secondary to in-stent restenosis.  He is currently on aspirin, Plavix, Eliquis.  I have recommended that he continue triple therapy for 30 days then stop aspirin and remain on Plavix and Eliquis.  He is doing well without recurrent anginal symptoms. Continue ASA 81 mg daily x 30 days post PCI, then DC Continue Eliquis 5 mg twice daily, statin, carvedilol 12.5 mg twice daily, as needed nitroglycerin DC smoking Follow-up 3 months

## 2023-03-26 NOTE — Assessment & Plan Note (Signed)
Most recent echocardiograms have demonstrated resolution of his thrombus.  He has been maintained on long-term anticoagulation therapy.  Labs from [facility] were reviewed.  His hemoglobin was normal during his most recent hospitalization at 14.3.  Creatinine was normal at 0.9.  Continue Eliquis 5 mg twice daily.

## 2023-03-26 NOTE — Assessment & Plan Note (Signed)
Status post multiple revascularization procedures.  Continue follow-up with vascular surgery as planned.

## 2023-03-29 NOTE — Telephone Encounter (Signed)
Call placed to pt regarding cholesterol medications and the need to verify which one the pt is taking.  Voicemail is full and couldn't leave a message.

## 2023-03-30 NOTE — Telephone Encounter (Signed)
2nd attempt to reach pt, voicemail is full and could not leave a message.

## 2023-04-02 ENCOUNTER — Ambulatory Visit: Payer: Medicaid Other | Attending: Physician Assistant

## 2023-04-02 DIAGNOSIS — I5020 Unspecified systolic (congestive) heart failure: Secondary | ICD-10-CM

## 2023-04-02 DIAGNOSIS — I214 Non-ST elevation (NSTEMI) myocardial infarction: Secondary | ICD-10-CM

## 2023-04-02 DIAGNOSIS — E7800 Pure hypercholesterolemia, unspecified: Secondary | ICD-10-CM

## 2023-04-02 DIAGNOSIS — I2510 Atherosclerotic heart disease of native coronary artery without angina pectoris: Secondary | ICD-10-CM

## 2023-04-03 ENCOUNTER — Other Ambulatory Visit (HOSPITAL_COMMUNITY): Payer: Self-pay | Admitting: Adult Health

## 2023-04-03 LAB — BASIC METABOLIC PANEL
BUN/Creatinine Ratio: 13 (ref 9–20)
BUN: 17 mg/dL (ref 6–24)
CO2: 21 mmol/L (ref 20–29)
Calcium: 9.1 mg/dL (ref 8.7–10.2)
Chloride: 98 mmol/L (ref 96–106)
Creatinine, Ser: 1.34 mg/dL — ABNORMAL HIGH (ref 0.76–1.27)
Glucose: 214 mg/dL — ABNORMAL HIGH (ref 70–99)
Potassium: 3.9 mmol/L (ref 3.5–5.2)
Sodium: 138 mmol/L (ref 134–144)
eGFR: 62 mL/min/{1.73_m2} (ref >59)

## 2023-04-04 ENCOUNTER — Other Ambulatory Visit (HOSPITAL_COMMUNITY): Payer: Self-pay | Admitting: Adult Health

## 2023-04-04 ENCOUNTER — Other Ambulatory Visit: Payer: Self-pay | Admitting: Cardiovascular Disease

## 2023-04-04 DIAGNOSIS — I513 Intracardiac thrombosis, not elsewhere classified: Secondary | ICD-10-CM

## 2023-04-04 DIAGNOSIS — I9789 Other postprocedural complications and disorders of the circulatory system, not elsewhere classified: Secondary | ICD-10-CM

## 2023-04-04 NOTE — Telephone Encounter (Signed)
Eliquis 5mg  refill request received. Patient is 57 years old, weight-100.2kg, Crea- 1.34 on 04/02/23, Diagnosis-Afib & LV Apical Thrombus, and last seen by Tereso Newcomer on 03/26/23. Dose is appropriate based on dosing criteria. Will send in refill to requested pharmacy.

## 2023-05-04 ENCOUNTER — Other Ambulatory Visit (HOSPITAL_COMMUNITY): Payer: Self-pay | Admitting: Adult Health

## 2023-05-18 ENCOUNTER — Ambulatory Visit: Payer: Medicaid Other | Attending: Cardiovascular Disease

## 2023-05-18 DIAGNOSIS — I2510 Atherosclerotic heart disease of native coronary artery without angina pectoris: Secondary | ICD-10-CM

## 2023-05-18 DIAGNOSIS — I5020 Unspecified systolic (congestive) heart failure: Secondary | ICD-10-CM

## 2023-05-18 DIAGNOSIS — E7800 Pure hypercholesterolemia, unspecified: Secondary | ICD-10-CM

## 2023-05-18 DIAGNOSIS — I214 Non-ST elevation (NSTEMI) myocardial infarction: Secondary | ICD-10-CM

## 2023-05-19 LAB — COMPREHENSIVE METABOLIC PANEL
ALT: 25 IU/L (ref 0–44)
AST: 24 IU/L (ref 0–40)
Albumin/Globulin Ratio: 1.3
Albumin: 4.1 g/dL (ref 3.8–4.9)
Alkaline Phosphatase: 176 IU/L — ABNORMAL HIGH (ref 44–121)
BUN/Creatinine Ratio: 11 (ref 9–20)
BUN: 14 mg/dL (ref 6–24)
Bilirubin Total: 0.5 mg/dL (ref 0.0–1.2)
CO2: 23 mmol/L (ref 20–29)
Calcium: 9 mg/dL (ref 8.7–10.2)
Chloride: 100 mmol/L (ref 96–106)
Creatinine, Ser: 1.33 mg/dL — ABNORMAL HIGH (ref 0.76–1.27)
Globulin, Total: 3.2 g/dL (ref 1.5–4.5)
Glucose: 214 mg/dL — ABNORMAL HIGH (ref 70–99)
Potassium: 3.7 mmol/L (ref 3.5–5.2)
Sodium: 139 mmol/L (ref 134–144)
Total Protein: 7.3 g/dL (ref 6.0–8.5)
eGFR: 63 mL/min/{1.73_m2} (ref >59)

## 2023-05-19 LAB — LIPID PANEL
Chol/HDL Ratio: 5.1 ratio — ABNORMAL HIGH (ref 0.0–5.0)
Cholesterol, Total: 147 mg/dL (ref 100–199)
HDL: 29 mg/dL — ABNORMAL LOW (ref >39)
LDL Chol Calc (NIH): 87 mg/dL (ref 0–99)
Triglycerides: 178 mg/dL — ABNORMAL HIGH (ref 0–149)
VLDL Cholesterol Cal: 31 mg/dL (ref 5–40)

## 2023-05-21 ENCOUNTER — Telehealth: Payer: Self-pay | Admitting: Cardiovascular Disease

## 2023-05-21 DIAGNOSIS — E7800 Pure hypercholesterolemia, unspecified: Secondary | ICD-10-CM

## 2023-05-21 NOTE — Telephone Encounter (Signed)
Pt returning nurses call regarding results. Please advise 

## 2023-05-21 NOTE — Telephone Encounter (Signed)
Pt returned my call.  He will have Corrie Dandy call back, as he has given verbal permission for me to speak with her since she is the one that does his medications.

## 2023-05-25 NOTE — Telephone Encounter (Signed)
Pt and Corrie Dandy returned my call.  Confirmed pt is only taking Atorvastatin and Zetia.  Rosuvastatin removed from pt's list.  Referral for Lipid Clinic has been placed.

## 2023-05-25 NOTE — Telephone Encounter (Signed)
Tried to reach pt again to verify medications since I never heard back from Clarita.  Left another message for pt or Corrie Dandy to call back.

## 2023-06-04 ENCOUNTER — Other Ambulatory Visit (HOSPITAL_COMMUNITY): Payer: Self-pay | Admitting: Adult Health

## 2023-06-15 ENCOUNTER — Ambulatory Visit (HOSPITAL_COMMUNITY): Payer: Medicaid Other | Attending: Physician Assistant

## 2023-06-15 ENCOUNTER — Encounter (HOSPITAL_COMMUNITY): Payer: Self-pay | Admitting: Physician Assistant

## 2023-06-20 NOTE — Progress Notes (Signed)
Cardiology Office Note:    Date:  06/22/2023  ID:  Ryan [name], DOB [birth year], MRN [id number] PCP: Fleet Contras, MD  [facility] Providers Cardiologist:  [name], MD       Patient Profile:      Coronary artery disease S/p anterior STEMI in 1/13 c/b cardiac arrest >> DES to LAD; OM1 w 100 CTO S/p multiple PCIs (POBA to LAD in 11/17, DES to LAD and POBA to Dx in 8/18 ISO Ant STEMI, POBA to LAD in 05/2019) s/p CABG 05/2020 S/p NSTEMI in 03/2022 ([facility]) s/p DES to S-RCA Admx w syncope in 03/2022 >> Imdur stopped  NSTEMI s/p POBA to S-RCA 2/2 ISR LHC 03/14/2023 ([facility]): LAD proximal/mid stent patent with 50 ISR, LIMA-LAD atretic, D1 jailed, SVG-D1 patent, LCx distal 100, RCA mid tandem 90, 80, SVG-distal RCA stent with 90 ISR Peripheral Arterial Disease (Dr. Myra Gianotti)  Distal aorta occlusion and L subclavian artery s/p aorto-bifem bypass in 2014 S/p L carotid to subclavian bypass in 5/14 R fem to AK popliteal bypass and R fem endarterectomy  S/p angioplasty to the right CFA in 06/2021 S/p angioplasty of the right CFA in 12/2022 S/p L femoral endarterectomy in 01/2023  Hyperlipidemia  HFmrEF (heart failure with mildly reduced ejection fraction); Ischemic CM Post MI EF 35; FU echocardiogram with normal EF        EF has improved and decreased (EF 30-35 >> EF 50-55) TTE 09/14/2020: EF 50-55 TTE 03/10/2022: EF 30-35 TTE 07/04/2022: EF 45-50, GR 2 DD, normal RVSF, normal PASP, mild LAE, trivial AI, RAP 3, anterior/apical HK TTE 03/15/2023 ([facility]): Anterior akinesis, EF 30-35, mild LVH, mild AI, mild MR Limited TTE 06/21/23: EF 35-40, Gr 2 DD, NL RVSF, mil dLAE, mild MR, mild AI, AV sclerosis, RAP 8, ant-apical AK LV apical thrombus Noted on echocardiogram post CABG 05/2020 >> chronic Apixaban Rx Post op AFib Carotid artery disease Carotid US 11/06/2022: Bilateral ICA 1-39,?  Occluded R VA, right subclavian stenosis Diabetes mellitus, insulin dependent   Tobacco abuse Monitor 04/17/2022: NSVT, rare PACs, NSR            Discussed the use of AI scribe software for clinical note transcription with the patient, who gave verbal consent to proceed. History of Present Illness   The patient, a 57 year old male with a history of coronary artery disease, prior myocardial infarction, multiple PCIs, and CABG in 2021, presents for follow-up after a recent hospitalization for a non-STEMI and subsequent angioplasty to the vein graft to the RCA due to significant instant restenosis. The patient has an ischemic cardiomyopathy with a variable ejection fraction, which was 45-50% in August 2023, decreased to 30-35% during the hospitalization in April 2024, and is currently 35-40% as per a recent echocardiogram yesterday.   The patient reports intermittent R arm pain, which is not unusual for him and is usually associated with physical activity. The patient also experiences shortness of breath, which he attributes to his heart condition and lung issues. The patient reports dizziness upon standing, which he manages by holding onto something until the dizziness subsides.  He has not had syncope, orthopnea, significant leg edema.     ROS:  See HPI No melena, hematochezia, hematuria.    Studies Reviewed:       Risk Assessment/Calculations:             Physical Exam:   VS:  BP 108/70   Pulse 75   Ht 5\' 9"  (1.753 m)  Wt 220 lb (99.8 kg)   SpO2 96%   BMI 32.49 kg/m    Wt Readings from Last 3 Encounters:  06/22/23 220 lb (99.8 kg)  03/26/23 220 lb 12.8 oz (100.2 kg)  02/19/23 222 lb 12.8 oz (101.1 kg)    Physical Exam   GEN: well nourished, well developed, no acute distress NECK: No JVD CARDIAC: distant heart sounds, no murmur LUNGS: decreased breath sounds, scattered wheezing, no rales ABD: soft EXT: No edema        Assessment and Plan  CAD (coronary artery disease) Stable angina, managed with nitroglycerin PRN. Recent angioplasty in April  2024. -Continue Plavix 75mg  daily, Atorvastatin, prn NTG. -Reduce Carvedilol to 9.375mg  BID.  HFrEF (heart failure with reduced ejection fraction) (HCC) EF improved on Limited TTE yesterday to 35-40% from 30-35% in April 2024. NYHA class 2b-3. No acute decompensation. Volume stable. Discussed potential need for ICD if EF remains <35%. Plan to discuss with Dr. [name] to see if we should consider cardiac MRI for more precise EF measurement. -Continue Entresto 24/26 mg bid, spironolactone 25 mg qd, Lasix 40 mg qd, and Farxiga 10 mg qd. -Reduce Carvedilol to 9.375mg  BID to help with blood pressure and dizziness.  Mixed hyperlipidemia LDL 87, goal <55 due to CAD. Referral to PharmD lipid clinic for consideration of PCSK9 inhibitor. -Continue atorvastatin 80mg  daily and Zetia 10mg  daily.  Left ventricular apical thrombus On Eliquis 5mg  BID. -Continue Eliquis 5mg  BID.  PVD (peripheral vascular disease) Stable claudication symptoms. -Continue current management as per vascular surgery.  Tobacco abuse Quit smoking. -Congratulate on smoking cessation.    Dispo:  Return in about 6 months (around 12/23/2023) for Routine Follow Up w Dr. [name].  Signed, Tereso Newcomer, PA-C

## 2023-06-21 ENCOUNTER — Ambulatory Visit: Payer: Medicaid Other | Attending: Cardiology

## 2023-06-21 DIAGNOSIS — I513 Intracardiac thrombosis, not elsewhere classified: Secondary | ICD-10-CM | POA: Diagnosis present

## 2023-06-21 DIAGNOSIS — E782 Mixed hyperlipidemia: Secondary | ICD-10-CM | POA: Diagnosis present

## 2023-06-21 DIAGNOSIS — E7800 Pure hypercholesterolemia, unspecified: Secondary | ICD-10-CM

## 2023-06-21 DIAGNOSIS — I214 Non-ST elevation (NSTEMI) myocardial infarction: Secondary | ICD-10-CM

## 2023-06-21 DIAGNOSIS — I10 Essential (primary) hypertension: Secondary | ICD-10-CM | POA: Insufficient documentation

## 2023-06-21 DIAGNOSIS — I739 Peripheral vascular disease, unspecified: Secondary | ICD-10-CM | POA: Diagnosis present

## 2023-06-21 DIAGNOSIS — I2510 Atherosclerotic heart disease of native coronary artery without angina pectoris: Secondary | ICD-10-CM | POA: Diagnosis not present

## 2023-06-21 DIAGNOSIS — I5020 Unspecified systolic (congestive) heart failure: Secondary | ICD-10-CM | POA: Diagnosis not present

## 2023-06-21 DIAGNOSIS — Z720 Tobacco use: Secondary | ICD-10-CM | POA: Insufficient documentation

## 2023-06-21 LAB — ECHOCARDIOGRAM LIMITED
Area-P 1/2: 4.83 cm2
P 1/2 time: 701 msec
S' Lateral: 3.9 cm

## 2023-06-21 MED ORDER — PERFLUTREN LIPID MICROSPHERE
1.0000 mL | INTRAVENOUS | Status: AC | PRN
Start: 2023-06-21 — End: 2023-06-21
  Administered 2023-06-21: 2 mL via INTRAVENOUS

## 2023-06-22 ENCOUNTER — Ambulatory Visit (INDEPENDENT_AMBULATORY_CARE_PROVIDER_SITE_OTHER): Payer: Medicaid Other | Admitting: Physician Assistant

## 2023-06-22 ENCOUNTER — Other Ambulatory Visit (HOSPITAL_COMMUNITY): Payer: Self-pay

## 2023-06-22 ENCOUNTER — Encounter: Payer: Self-pay | Admitting: Physician Assistant

## 2023-06-22 ENCOUNTER — Encounter: Payer: Self-pay | Admitting: Pharmacist

## 2023-06-22 ENCOUNTER — Ambulatory Visit: Payer: Medicaid Other | Admitting: Pharmacist

## 2023-06-22 ENCOUNTER — Telehealth: Payer: Self-pay | Admitting: Pharmacist

## 2023-06-22 ENCOUNTER — Telehealth: Payer: Self-pay

## 2023-06-22 VITALS — BP 108/70 | HR 75 | Ht 69.0 in | Wt 220.0 lb

## 2023-06-22 DIAGNOSIS — Z720 Tobacco use: Secondary | ICD-10-CM

## 2023-06-22 DIAGNOSIS — E782 Mixed hyperlipidemia: Secondary | ICD-10-CM

## 2023-06-22 DIAGNOSIS — I10 Essential (primary) hypertension: Secondary | ICD-10-CM | POA: Diagnosis not present

## 2023-06-22 DIAGNOSIS — I5020 Unspecified systolic (congestive) heart failure: Secondary | ICD-10-CM | POA: Diagnosis not present

## 2023-06-22 DIAGNOSIS — I739 Peripheral vascular disease, unspecified: Secondary | ICD-10-CM

## 2023-06-22 DIAGNOSIS — I2510 Atherosclerotic heart disease of native coronary artery without angina pectoris: Secondary | ICD-10-CM

## 2023-06-22 DIAGNOSIS — I513 Intracardiac thrombosis, not elsewhere classified: Secondary | ICD-10-CM

## 2023-06-22 MED ORDER — VASCEPA 1 G PO CAPS: 2.0000 g | ORAL_CAPSULE | Freq: Two times a day (BID) | ORAL | 3 refills | Status: DC

## 2023-06-22 MED ORDER — REPATHA SURECLICK 140 MG/ML ~~LOC~~ SOAJ: 140.0000 mg | SUBCUTANEOUS | 3 refills | Status: DC

## 2023-06-22 MED ORDER — CARVEDILOL 6.25 MG PO TABS: 9.7500 mg | ORAL_TABLET | Freq: Two times a day (BID) | ORAL | 3 refills | Status: DC

## 2023-06-22 NOTE — Telephone Encounter (Signed)
Please submit prior auth for Repatha 140mg  Q2W and Vascepa 2 capsules twice daily - both for ASCVD indication.

## 2023-06-22 NOTE — Patient Instructions (Signed)
Medication Instructions:  Your physician has recommended you make the following change in your medication:   REDUCE the Carvedilol to 6.25 taking 1 and 1/2 tablet twice a day  *If you need a refill on your cardiac medications before your next appointment, please call your pharmacy*   Lab Work: None ordered  If you have labs (blood work) drawn today and your tests are completely normal, you will receive your results only by: MyChart Message (if you have MyChart) OR A paper copy in the mail If you have any lab test that is abnormal or we need to change your treatment, we will call you to review the results.   Testing/Procedures: None ordered   Follow-Up: At [facility], you and your health needs are our priority.  As part of our continuing mission to provide you with exceptional heart care, we have created designated Provider Care Teams.  These Care Teams include your primary Cardiologist (physician) and Advanced Practice Providers (APPs -  Physician Assistants and Nurse Practitioners) who all work together to provide you with the care you need, when you need it.  We recommend signing up for the patient portal called "MyChart".  Sign up information is provided on this After Visit Summary.  MyChart is used to connect with patients for Virtual Visits (Telemedicine).  Patients are able to view lab/test results, encounter notes, upcoming appointments, etc.  Non-urgent messages can be sent to your provider as well.   To learn more about what you can do with MyChart, go to [email or web address].    Your next appointment:   6 month(s)  Provider:   [name], MD     Other Instructions

## 2023-06-22 NOTE — Telephone Encounter (Signed)
Pharmacy Patient Advocate Encounter   Received notification from Pt Calls Messages that prior authorization for REPATHA is required/requested.   Insurance verification completed.   The patient is insured through [facility] .   Per test claim: PA submitted to [facility] via CoverMyMeds Key/confirmation #/EOC [id number] Status is pending

## 2023-06-22 NOTE — Assessment & Plan Note (Signed)
LDL 87, goal <55 due to CAD. Referral to PharmD lipid clinic for consideration of PCSK9 inhibitor. -Continue atorvastatin 80mg  daily and Zetia 10mg  daily.

## 2023-06-22 NOTE — Telephone Encounter (Signed)
Repatha PA approved, rx sent to pharmacy. Pt notified via mychart.

## 2023-06-22 NOTE — Telephone Encounter (Signed)
Brand Vascepa rx sent in.

## 2023-06-22 NOTE — Telephone Encounter (Signed)
Pharmacy Patient Advocate Encounter  Received notification from [facility] that Prior Authorization for REPATHA has been APPROVED from 06/22/23 to 06/21/24.Marland Kitchen

## 2023-06-22 NOTE — Assessment & Plan Note (Signed)
Stable claudication symptoms. -Continue current management as per vascular surgery.

## 2023-06-22 NOTE — Progress Notes (Signed)
Patient ID: Ryan [name]                 DOB: [birth year]                    MRN: [id number]     HPI: Ryan [name] is a 57 y.o. male patient referred to lipid clinic by Ryan Newcomer, PA. PMH is significant for CAD s/p STEMI 12/2011 with DES to LAD, multiple PCIs - 10/2016, 07/2017, and 05/2019, CABG in 05/2020, NSTEMI 03/2022 with DES, PAD s/p aorto-bifem bypass in 2014, L carotid to subclavian bypass in 04/2013, R fem to AK popliteal bypass and R fem endarterectomy, angioplasty to right CFA 06/2021 and 12/2022, L femoral endarterectomy 01/2023, HFrEF, HTN, DM, COPD, and tobacco use.  Pt reports adherence to lipid meds, tolerating them well. Reports he stopped smoking after his last MI. Has Medicaid insurance.  Current Medications: atorvastatin 80mg  daily, ezetimibe 10mg  daily  Risk Factors: CAD s/p MI and CABG with premature, progressive and extensive ASCVD, PAD with multiple revascularization procedures, HF, HTN, DM, tobacco use  LDL goal: as low as possible, preferably 40mg /dL  Exercise: Stays active  Family History: Mother with DM and COPD, father with lung cancer.  Social History: Current smoker 1/4 PPD, social alcohol, no drug use.  Labs: 05/18/23: TC 147, TG 178, HDL 29, LDL 87 - atorvastatin 80mg  daily, ezetimibe 10mg  daily 03/13/23: TC 153, TG 238, HDL 28, LDL 92 - atorvastatin 80mg  daily  Past Medical History:  Diagnosis Date   Acute myocardial infarction of other anterior wall, subsequent episode of care 12/12/2011   Anal fissure    Arthritis    back   Atherosclerosis of native artery of both lower extremities with intermittent claudication (HCC) 04/01/2018   Bruises easily    d/t being on Effient   CAD (coronary artery disease)    A. Acute Ant STEMI 12/12/2011    Cardiomyopathy secondary 12/26/2011   Chronic systolic CHF (congestive heart failure) (HCC)    ischemic CM // Echo 6/21: EF 30-35  //  Echocardiogram 10/21: LV apical thrombus resolved, EF 50-55, ant and apical, ant-lat HK,  trivial MR, trivial AI   Chronic total occlusion of artery of the extremities (HCC) 02/26/2012   Claudication (HCC) 12/26/2011   Coronary atherosclerosis of native coronary artery    a. ant STEMI with cardiac arrest 2013 s/p DES to mLAD. b. [place] 10/2016 s/p PTCA to mLAD.   Diabetes mellitus without complication (HCC)    Essential hypertension 10/23/2016   GERD (gastroesophageal reflux disease)    "takes tums"   History of blood transfusion    no abnormal reaction noted   History of kidney stones    Hypertension    Ischemic cardiomyopathy    a. EF 35% in 02/2012 at time of acute MI, improved to normal on subsequent imaging.   MI (myocardial infarction) (HCC)    AMI 1/13 - complicated by VT/Tosades   Mixed hyperlipidemia    Numbness and tingling of right arm 03/31/2013   Occlusion and stenosis of carotid artery without mention of cerebral infarction 08/26/2012   PAD (peripheral artery disease) (HCC)    followed by Vascular - aortobifem bypass 01/2013 & left carotid-subclavian artery bypass 04/2013   Peripheral vascular disease, unspecified (HCC) 12/16/2012   PVD (peripheral vascular disease) (HCC) 03/31/2013   Rectal polyp    Subclavian steal syndrome 08/11/2013   Uncontrolled diabetes mellitus 10/23/2016   Wears partial dentures    upper  Current Outpatient Medications on File Prior to Visit  Medication Sig Dispense Refill   albuterol (VENTOLIN HFA) 108 (90 Base) MCG/ACT inhaler Inhale 1-2 puffs into the lungs every 6 (six) hours as needed for wheezing or shortness of breath.     aspirin EC 81 MG tablet Take 1 tablet (81 mg total) by mouth daily. 90 tablet 3   atorvastatin (LIPITOR) 80 MG tablet Take 80 mg by mouth daily at 6 PM.     carvedilol (COREG) 12.5 MG tablet Take 1 tablet (12.5 mg total) by mouth 2 (two) times daily. 180 tablet 3   cetirizine (ZYRTEC) 10 MG tablet Take 10 mg by mouth daily as needed for allergies.     clopidogrel (PLAVIX) 75 MG tablet Take 1 tablet (75 mg total)  by mouth daily. 30 tablet 11   cyclobenzaprine (FLEXERIL) 10 MG tablet Take 1 tablet (10 mg total) by mouth 2 (two) times daily as needed for muscle spasms. 30 tablet 0   diazepam (VALIUM) 10 MG tablet Take 10 mg by mouth at bedtime.     ELIQUIS 5 MG TABS tablet TAKE 1 TABLET BY MOUTH 2 TIMES A DAY 180 tablet 1   ezetimibe (ZETIA) 10 MG tablet Take 1 tablet (10 mg total) by mouth daily. 30 tablet 11   FARXIGA 10 MG TABS tablet Take 10 mg by mouth daily.     furosemide (LASIX) 40 MG tablet Take 40 mg by mouth daily.  Can take extra tablet for wt gain of 3 lbs in 24 hour or 5 lbs in one week.     gabapentin (NEURONTIN) 300 MG capsule Take 300 mg by mouth 3 (three) times daily.     insulin glargine (LANTUS) 100 UNIT/ML Solostar Pen Inject 50 Units into the skin 2 (two) times daily.     insulin lispro (HUMALOG) 100 UNIT/ML injection Inject 5-15 Units into the skin 3 (three) times daily as needed (blood sugar over 150).     naloxone (NARCAN) nasal spray 4 mg/0.1 mL Place 4 mg into the nose once as needed (overdose).     nitroGLYCERIN (NITROSTAT) 0.4 MG SL tablet Place 0.4 mg under the tongue every 5 (five) minutes as needed for chest pain.     oxyCODONE-acetaminophen (PERCOCET) 10-325 MG tablet Take 1 tablet by mouth 4 (four) times daily.     pantoprazole (PROTONIX) 40 MG tablet Take 40 mg by mouth daily.     sacubitril-valsartan (ENTRESTO) 24-26 MG Take 1 tablet by mouth 2 (two) times daily. Address refills with Dr. Gibson [name] office 60 tablet 0   spironolactone (ALDACTONE) 25 MG tablet Take 25 mg by mouth daily.     TRINTELLIX 20 MG TABS tablet Take 20 mg by mouth daily in the afternoon.     No current facility-administered medications on file prior to visit.    Allergies  Allergen Reactions   Metformin Anaphylaxis    Water blisters between fingers   Hydrocodone Hives and Nausea And Vomiting    Assessment/Plan:  1. Hyperlipidemia - LDL 87 on atorvastatin 80mg  daily and ezetimibe 10mg   daily. LDL goal as low as possible, ideally < 40 given incredibly extensive and progressive CAD and PAD s/p many revascularization procedures over the years. TG 178 with goal < 150. Discussed CV benefit of both Repatha and Vascepa which he is agreeable to starting. Will submit prior authorizations, he has Medicaid so copays will be low. Will plan to recheck lipids in 2-3 months  Alyla Pietila E. Andres Bantz, PharmD, BCACP, CPP  [facility] [address] [place], [place] [address] Phone: [phone]; Fax: [phone] 06/22/2023 9:12 AM

## 2023-06-22 NOTE — Assessment & Plan Note (Signed)
On Eliquis 5mg  BID. -Continue Eliquis 5mg  BID.

## 2023-06-22 NOTE — Assessment & Plan Note (Signed)
Stable angina, managed with nitroglycerin PRN. Recent angioplasty in April 2024. -Continue Plavix 75mg  daily, Atorvastatin, prn NTG. -Reduce Carvedilol to 9.375mg  BID.

## 2023-06-22 NOTE — Patient Instructions (Signed)
Your LDL (bad) cholesterol is 87 and your goal is as low as possible  I will submit information to your insurance for Repatha and Vascepa and let you know when I hear back.    Repatha is a subcutaneous injection given once every 2 weeks in the fatty tissue of your stomach or upper outer thigh. Store the medication in the fridge. You can let your dose warm up to room temperature for 30 minutes before injecting if you prefer. Repatha will lower your LDL cholesterol by 60% and helps to lower your chance of having a heart attack or stroke.  Vascepa is prescription fish oil that lowers your triglycerides as well as your risk of heart attack and stroke  Continue taking your other medications

## 2023-06-22 NOTE — Telephone Encounter (Signed)
Vascepa brand is covered. Pharmacy will need a script with a DAW 9 on it. Working on [employer] for [employer].

## 2023-06-22 NOTE — Assessment & Plan Note (Signed)
EF improved on Limited TTE yesterday to 35-40% from 30-35% in April 2024. NYHA class 2b-3. No acute decompensation. Volume stable. Discussed potential need for ICD if EF remains <35%. Plan to discuss with Dr. [name] to see if we should consider cardiac MRI for more precise EF measurement. -Continue Entresto 24/26 mg bid, spironolactone 25 mg qd, Lasix 40 mg qd, and Farxiga 10 mg qd. -Reduce Carvedilol to 9.375mg  BID to help with blood pressure and dizziness.

## 2023-06-22 NOTE — Assessment & Plan Note (Signed)
Quit smoking. -Congratulate on smoking cessation.

## 2023-06-25 NOTE — Addendum Note (Signed)
Addended by: Graviel Payeur E on: 06/25/2023 07:59 AM   Modules accepted: Orders

## 2023-07-05 ENCOUNTER — Other Ambulatory Visit (HOSPITAL_COMMUNITY): Payer: Self-pay | Admitting: Adult Health

## 2023-07-05 MED ORDER — ENTRESTO 24-26 MG PO TABS: 1.0000 | ORAL_TABLET | Freq: Two times a day (BID) | ORAL | 3 refills | Status: DC

## 2023-08-10 ENCOUNTER — Ambulatory Visit: Payer: Medicaid Other

## 2023-08-20 ENCOUNTER — Ambulatory Visit: Payer: Medicaid Other | Attending: Physician Assistant

## 2023-08-20 DIAGNOSIS — E782 Mixed hyperlipidemia: Secondary | ICD-10-CM

## 2023-08-21 LAB — HEPATIC FUNCTION PANEL
ALT: 36 IU/L (ref 0–44)
AST: 23 IU/L (ref 0–40)
Albumin: 4.2 g/dL (ref 3.8–4.9)
Alkaline Phosphatase: 151 IU/L — ABNORMAL HIGH (ref 44–121)
Bilirubin Total: 0.5 mg/dL (ref 0.0–1.2)
Bilirubin, Direct: 0.19 mg/dL (ref 0.00–0.40)
Total Protein: 7.2 g/dL (ref 6.0–8.5)

## 2023-08-21 LAB — LIPID PANEL
Chol/HDL Ratio: 3.4 ratio (ref 0.0–5.0)
Cholesterol, Total: 121 mg/dL (ref 100–199)
HDL: 36 mg/dL — ABNORMAL LOW (ref >39)
LDL Chol Calc (NIH): 44 mg/dL (ref 0–99)
Triglycerides: 265 mg/dL — ABNORMAL HIGH (ref 0–149)
VLDL Cholesterol Cal: 41 mg/dL — ABNORMAL HIGH (ref 5–40)

## 2023-10-03 ENCOUNTER — Other Ambulatory Visit: Payer: Self-pay | Admitting: Cardiovascular Disease

## 2023-10-03 DIAGNOSIS — I4891 Unspecified atrial fibrillation: Secondary | ICD-10-CM

## 2023-10-03 DIAGNOSIS — I513 Intracardiac thrombosis, not elsewhere classified: Secondary | ICD-10-CM

## 2023-10-03 NOTE — Telephone Encounter (Signed)
Eliquis 5mg  refill request received. Patient is 57 years old, weight-99.8kg, Crea-1.33 on 05/18/23, Diagnosis-LV Apical thrombus, and last seen by Tereso Newcomer on 06/22/23. Dose is appropriate based on dosing criteria. Will send in refill to requested pharmacy.

## 2023-12-19 ENCOUNTER — Ambulatory Visit: Payer: Medicaid Other | Admitting: Cardiovascular Disease

## 2023-12-19 NOTE — Progress Notes (Deleted)
No chief complaint on file.  History of Present Illness: 58 yo male with history of ongoing tobacco abuse, CAD, HLD, PAD, HLD, ischemic cardiomyopathy and insulin dependent diabetes mellitus who is here today for cardiac follow up. He had an anterior STEMI in January 2013 with cardiac arrest and found to have an occluded mid LAD treated with a drug eluting stent. The third OM branch was chronically occluded and the RCA had mild disease. His post MI LVEF was 35% but this was found to be normal on follow up echo. Cardiac cath in November 2017 with severe stent restenosis in the LAD, treated with a cutting balloon angioplasty. He did not keep his f/u appt in our office following his PCI. He has been non-compliant with follow up, medications over the past few years and continues to smoke. Admitted to [facility] in August 2018 with an anterior STEMI. His LAD was occluded proximal to the old stent and this was treated with a drug eluting stent. Angioplasty performed on the ostial Diagonal branch. LVEF was 40% following the MI. He had unstable angina in June 2020 and repeat cardiac cath showed severe restenosis in the mid LAD stented segment which was treated with cutting balloon angioplasty. He also had ostial stenosis of the diagonal branch (jailed by LAD stents), mild to moderate non-obstructive disease in the mid RCA and moderate LV systolic dysfunction with anteroapical hypokinesis (c/w findings on echo in 2018 at [facility]). LVEF=35-45%. He was placed on DAPT w/ ASA and Brilinta and continued on statin, ? blocker, ACEi and long acting nitrate. I saw him in May 2021 and he c/o chest pain c/w unstable angina. Cardiac cath 04/22/20 with progression of disease in the LAD and RCA. He underwent 3V CABG 05/25/20. (LIMA to LAD, SVG to Diagonal and SVG to RCA). His post-operative course was complicated by atrial fibrillation and he was started on amiodarone. Echo with LVEF=30-35% with apical thrombus.  He was discharged on Eliquis with a Lifevest. Echo post bypass with LVEF=40-45%. He was seen in our office 02/22/22 by Dr. Lynnette Caffey and reported chest pain. Imdur was started. He was then admitted to [facility] with chest pain and NSTEMI. Cardiac cath at [facility] April 2023 with severe disease in the SVG to RCA treated with a drug eluting stent. He was then admitted to [facility] 03/30/22 with syncope x 3 and was Imdur was stopped. He was felt to be volume overloaded with c/o LE edema and abdominal swelling and was diuresed with Lasix. Echo 03/30/22 with LVEF=30-35% with apical akinesis. No LV thrombus. He was seen in the Advanced Heart Failure clinic in May 2023 and most recently in January 2024. Last echo August 2023 with LVEF=45-50%. Cardiac monitor May 2023 with 6 beats of VT, sinus, rare PACs. He was admitted to [facility] April 2024 with a NSTEMI. Echo with LVEF=30-35%. Cardiac cath with patent LAD stents, patent vein to OM and severe restenosis in the vein graft to the RCA which was treated with balloon angioplasty. Echo July 2024 with LVEF=40%. Mild MR. Mild AI.   His PAD is followed in [facility] by Dr. Myra Gianotti. In 2013 he was found to have occlusion of his distal aorta and left subclavian artery. He underwent aortobifemoral bypass in February 2014 and left carotid to subclavian bypass in May 2014.  In April 2019, he underwent right femoral to AK popliteal bypass with saphenous vein and right femoral endarterectomy. In July 2022 he had drug coated balloon  angioplasty of the right common femoral artery. He underwent repeat drug coated balloon angioplasty of the right common femoral artery in January 2024 and then left femoral endarterectomy in February 2024.   He is here today for follow up. The patient denies any chest pain, dyspnea, palpitations, lower extremity edema, orthopnea, PND, dizziness, near syncope or syncope.   Primary Care Physician: Fleet Contras, MD  Past Medical History:  Diagnosis  Date   Acute myocardial infarction of other anterior wall, subsequent episode of care 12/12/2011   Anal fissure    Arthritis    back   Atherosclerosis of native artery of both lower extremities with intermittent claudication (HCC) 04/01/2018   Bruises easily    d/t being on Effient   CAD (coronary artery disease)    A. Acute Ant STEMI 12/12/2011    Cardiomyopathy secondary 12/26/2011   Chronic systolic CHF (congestive heart failure) (HCC)    ischemic CM // Echo 6/21: EF 30-35  //  Echocardiogram 10/21: LV apical thrombus resolved, EF 50-55, ant and apical, ant-lat HK, trivial MR, trivial AI   Chronic total occlusion of artery of the extremities (HCC) 02/26/2012   Claudication (HCC) 12/26/2011   Coronary atherosclerosis of native coronary artery    a. ant STEMI with cardiac arrest 2013 s/p DES to mLAD. b. [place] 10/2016 s/p PTCA to mLAD.   Diabetes mellitus without complication (HCC)    Essential hypertension 10/23/2016   GERD (gastroesophageal reflux disease)    "takes tums"   History of blood transfusion    no abnormal reaction noted   History of kidney stones    Hypertension    Ischemic cardiomyopathy    a. EF 35% in 02/2012 at time of acute MI, improved to normal on subsequent imaging.   MI (myocardial infarction) (HCC)    AMI 1/13 - complicated by VT/Tosades   Mixed hyperlipidemia    Numbness and tingling of right arm 03/31/2013   Occlusion and stenosis of carotid artery without mention of cerebral infarction 08/26/2012   PAD (peripheral artery disease) (HCC)    followed by [facility] - aortobifem bypass 01/2013 & left carotid-subclavian artery bypass 04/2013   Peripheral vascular disease, unspecified (HCC) 12/16/2012   PVD (peripheral vascular disease) (HCC) 03/31/2013   Rectal polyp    Subclavian steal syndrome 08/11/2013   Uncontrolled diabetes mellitus 10/23/2016   Wears partial dentures    upper    Past Surgical History:  Procedure Laterality Date   ABDOMINAL AORTOGRAM W/LOWER  EXTREMITY N/A 06/26/2017   Procedure: Abdominal Aortogram w/Lower Extremity;  Surgeon: Nada Libman, MD;  [facility];  Service: Cardiovascular;  Laterality: N/A;   ABDOMINAL AORTOGRAM W/LOWER EXTREMITY N/A 06/28/2021   Procedure: ABDOMINAL AORTOGRAM W/LOWER EXTREMITY;  Surgeon: Nada Libman, MD;  [facility]: [facility];  Service: Cardiovascular;  Laterality: N/A;   ABDOMINAL AORTOGRAM W/LOWER EXTREMITY N/A 01/02/2023   Procedure: ABDOMINAL AORTOGRAM W/LOWER EXTREMITY;  Surgeon: Nada Libman, MD;  [facility]: [facility];  Service: Cardiovascular;  Laterality: N/A;   ANAL FISSURECTOMY     AORTA - BILATERAL FEMORAL ARTERY BYPASS GRAFT N/A 01/16/2013   Procedure: AORTA BIFEMORAL BYPASS GRAFT;  Surgeon: Nada Libman, MD;  Location: [facility] OR;  Service: Vascular;  Laterality: N/A;   APPENDECTOMY     CARDIAC CATHETERIZATION N/A 10/23/2016   Procedure: Left Heart Cath and Coronary Angiography;  Surgeon: Kathleene Hazel, MD;  Location: [facility] INVASIVE CV LAB;  Service: Cardiovascular;  Laterality: N/A;  CARDIAC CATHETERIZATION N/A 10/23/2016   Procedure: Coronary Balloon Angioplasty;  Surgeon: Kathleene Hazel, MD;  Location: [facility] INVASIVE CV LAB;  Service: Cardiovascular;  Laterality: N/A;   CAROTID-SUBCLAVIAN BYPASS GRAFT Left 04/03/2013   Procedure: BYPASS GRAFT CAROTID-SUBCLAVIAN;  Surgeon: Nada Libman, MD;  Location: [facility] OR;  Service: Vascular;  Laterality: Left;   CIRCUMCISION N/A 12/15/2015   Procedure: CIRCUMCISION ADULT;  Surgeon: Malen Gauze, MD;  Location: [facility];  Service: Urology;  Laterality: N/A;   COLONOSCOPY     CORONARY ANGIOPLASTY     stent placed Dec 12, 2011 and 2018   CORONARY ARTERY BYPASS GRAFT N/A 05/05/2020   Procedure: CORONARY ARTERY BYPASS GRAFTING (CABG) x Three, Using Left internal Mammary Artery and Left Leg greater saphenous vein harvested endoscopically;  Surgeon: Delight Ovens, MD;  Location: [facility] OR;  Service: Open  Heart Surgery;  Laterality: N/A;   CORONARY BALLOON ANGIOPLASTY N/A 05/23/2019   Procedure: CORONARY BALLOON ANGIOPLASTY;  Surgeon: Kathleene Hazel, MD;  [facility];  Service: Cardiovascular;  Laterality: N/A;   CORONARY PRESSURE/FFR STUDY N/A 04/22/2020   Procedure: INTRAVASCULAR PRESSURE WIRE/FFR STUDY;  Surgeon: Kathleene Hazel, MD;  [facility];  Service: Cardiovascular;  Laterality: N/A;   DIAGNOSTIC LAPAROSCOPY     ENDARTERECTOMY FEMORAL Right 03/13/2018   Procedure: FEMORAL ENDARTERECTOMY RIGHT;  Surgeon: Nada Libman, MD;  [facility];  Service: Vascular;  Laterality: Right;   ENDARTERECTOMY FEMORAL Left 01/26/2023   Procedure: REDO LEFT COMMON FEMORAL ENDARTERECTOMY WITH VPA;  Surgeon: Nada Libman, MD;  Location: [facility] OR;  Service: Vascular;  Laterality: Left;   FEMORAL-POPLITEAL BYPASS GRAFT Right 03/13/2018   Procedure: FEMORAL-POPLITEAL ARTERY BYPASS WITH NON-REVERSE VEIN RIGHT;  Surgeon: Nada Libman, MD;  [facility];  Service: Vascular;  Laterality: Right;   GROIN DISSECTION Right 03/13/2018   Procedure: RE-DO COMMON FEMORAL ARTERY EXPOSURE;  Surgeon: Nada Libman, MD;  Location[facility];  Service: Vascular;  Laterality: Right;   I & D EXTREMITY Left 04/18/2013   Procedure: debridement of left neck lymphocele;  Surgeon: Nada Libman, MD;  Location: [facility] OR;  Service: Vascular;  Laterality: Left;  I and D of left neck   I & D EXTREMITY Right 11/21/2014   Procedure: IRRIGATION AND DEBRIDEMENT RIGHT HAND;  Surgeon: Dominica Severin, MD;  Location: [facility];  Service: Orthopedics;  Laterality: Right;   LEFT HEART CATH AND CORONARY ANGIOGRAPHY N/A 05/23/2019   Procedure: LEFT HEART CATH AND CORONARY ANGIOGRAPHY;  Surgeon: Kathleene Hazel, MD;  [facility];  Service: Cardiovascular;  Laterality: N/A;   LEFT HEART CATH AND CORONARY ANGIOGRAPHY N/A 04/22/2020   Procedure: LEFT HEART CATH AND CORONARY  ANGIOGRAPHY;  Surgeon: Kathleene Hazel, MD;  [facility];  Service: Cardiovascular;  Laterality: N/A;   LEFT HEART CATHETERIZATION WITH CORONARY ANGIOGRAM N/A 12/12/2011   Procedure: LEFT HEART CATHETERIZATION WITH CORONARY ANGIOGRAM;  Surgeon: Kathleene Hazel, MD;  Location: [facility] CATH LAB;  Service: Cardiovascular;  Laterality: N/A;   LOWER EXTREMITY ANGIOGRAM N/A 01/31/2012   Procedure: LOWER EXTREMITY ANGIOGRAM;  Surgeon: Kathleene Hazel, MD;  Location: [facility] CATH LAB;  Service: Cardiovascular;  Laterality: N/A;   LOWER EXTREMITY ANGIOGRAPHY N/A 04/23/2018   Procedure: LOWER EXTREMITY ANGIOGRAPHY;  Surgeon: Nada Libman, MD;  [facility];  Service: Cardiovascular;  Laterality: N/A;   PATCH ANGIOPLASTY Left 01/26/2023   Procedure: PATCH ANGIOPLASTY USING HEMASHIELD  PLATINUM FINESSE;  Surgeon: Nada Libman, MD;  Location: [facility] OR;  Service: Vascular;  Laterality: Left;   PERCUTANEOUS CORONARY STENT INTERVENTION (PCI-S) Right 12/12/2011   Procedure: PERCUTANEOUS CORONARY STENT INTERVENTION (PCI-S);  Surgeon: Kathleene Hazel, MD;  Location: [facility] CATH LAB;  Service: Cardiovascular;  Laterality: Right;   PERIPHERAL VASCULAR BALLOON ANGIOPLASTY Right 06/28/2021   Procedure: PERIPHERAL VASCULAR BALLOON ANGIOPLASTY;  Surgeon: Nada Libman, MD;  [facility];  Service: Cardiovascular;  Laterality: Right;  common femoral (graft)   PERIPHERAL VASCULAR BALLOON ANGIOPLASTY  01/02/2023   Procedure: PERIPHERAL VASCULAR BALLOON ANGIOPLASTY;  Surgeon: Nada Libman, MD;  [facility];  Service: Cardiovascular;;   PERIPHERAL VASCULAR INTERVENTION Left 04/23/2018   Procedure: PERIPHERAL VASCULAR INTERVENTION;  Surgeon: Nada Libman, MD;  [facility];  Service: Cardiovascular;  Laterality: Left;  superficial femoral   REPAIR EXTENSOR TENDON Right 11/21/2014   Procedure: WITH REPAIR/RECONSTRUCTION OF  EXTENSOR TENDONS AS NEEDED;  Surgeon: Dominica Severin, MD;  Location: [facility] OR;  Service: Orthopedics;  Laterality: Right;   TEE WITHOUT CARDIOVERSION N/A 05/05/2020   Procedure: TRANSESOPHAGEAL ECHOCARDIOGRAM (TEE);  Surgeon: Delight Ovens, MD;  Location: [facility] OR;  Service: Open Heart Surgery;  Laterality: N/A;    Current Outpatient Medications  Medication Sig Dispense Refill   albuterol (VENTOLIN HFA) 108 (90 Base) MCG/ACT inhaler Inhale 1-2 puffs into the lungs every 6 (six) hours as needed for wheezing or shortness of breath.     aspirin EC 81 MG tablet Take 1 tablet (81 mg total) by mouth daily. 90 tablet 3   atorvastatin (LIPITOR) 80 MG tablet Take 80 mg by mouth daily at 6 PM.     carvedilol (COREG) 6.25 MG tablet Take 1.5 tablets (9.375 mg total) by mouth 2 (two) times daily. 270 tablet 3   cetirizine (ZYRTEC) 10 MG tablet Take 10 mg by mouth daily as needed for allergies.     clopidogrel (PLAVIX) 75 MG tablet Take 1 tablet (75 mg total) by mouth daily. 30 tablet 11   cyclobenzaprine (FLEXERIL) 10 MG tablet Take 1 tablet (10 mg total) by mouth 2 (two) times daily as needed for muscle spasms. 30 tablet 0   diazepam (VALIUM) 10 MG tablet Take 10 mg by mouth at bedtime.     ELIQUIS 5 MG TABS tablet TAKE 1 TABLET BY MOUTH 2 TIMES A DAY 180 tablet 1   Evolocumab (REPATHA SURECLICK) 140 MG/ML SOAJ Inject 140 mg into the skin every 14 (fourteen) days. 6 mL 3   ezetimibe (ZETIA) 10 MG tablet Take 1 tablet (10 mg total) by mouth daily. 30 tablet 11   FARXIGA 10 MG TABS tablet Take 10 mg by mouth daily.     furosemide (LASIX) 40 MG tablet Take 40 mg by mouth daily.  Can take extra tablet for wt gain of 3 lbs in 24 hour or 5 lbs in one week.     gabapentin (NEURONTIN) 300 MG capsule Take 300 mg by mouth 3 (three) times daily.     insulin glargine (LANTUS) 100 UNIT/ML Solostar Pen Inject 50 Units into the skin 2 (two) times daily.     insulin lispro (HUMALOG) 100 UNIT/ML injection Inject 5-15 Units  into the skin 3 (three) times daily as needed (blood sugar over 150).     naloxone (NARCAN) nasal spray 4 mg/0.1 mL Place 4 mg into the nose once as needed (overdose).     nitroGLYCERIN (NITROSTAT) 0.4 MG  SL tablet Place 0.4 mg under the tongue every 5 (five) minutes as needed for chest pain.     oxyCODONE-acetaminophen (PERCOCET) 10-325 MG tablet Take 1 tablet by mouth 4 (four) times daily.     pantoprazole (PROTONIX) 40 MG tablet Take 40 mg by mouth daily.     sacubitril-valsartan (ENTRESTO) 24-26 MG Take 1 tablet by mouth 2 (two) times daily. 180 tablet 3   spironolactone (ALDACTONE) 25 MG tablet Take 25 mg by mouth daily.     TRINTELLIX 20 MG TABS tablet Take 20 mg by mouth daily in the afternoon.     VASCEPA 1 g capsule Take 2 capsules (2 g total) by mouth 2 (two) times daily. 360 capsule 3   No current facility-administered medications for this visit.    Allergies  Allergen Reactions   Metformin Anaphylaxis    Water blisters between fingers   Hydrocodone Hives and Nausea And Vomiting    Social History   Socioeconomic History   Marital status: Divorced    Spouse name: Not on file   Number of children: 1   Years of education: Not on file   Highest education level: High school graduate  Occupational History    Employer: OTHER   Occupation: Disablity    Comment: Since 2013  Tobacco Use   Smoking status: Some Days    Current packs/day: 0.25    Average packs/day: 0.3 packs/day for 25.0 years (6.3 ttl pk-yrs)    Types: Cigarettes   Smokeless tobacco: Never   Tobacco comments:    Smoking cessation  Vaping Use   Vaping status: Never Used  Substance and Sexual Activity   Alcohol use: Yes    Alcohol/week: 2.0 standard drinks of alcohol    Types: 2 Cans of beer per week    Comment: socially   Drug use: No   Sexual activity: Yes  Other Topics Concern   Not on file  Social History Narrative   Disabled - no longer works   Social Drivers of Corporate investment banker  Strain: Low Risk  (03/30/2022)   Overall Financial Resource Strain (CARDIA)    Difficulty of Paying Living Expenses: Not very hard  Food Insecurity: Low Risk  (03/14/2023)   [facility]   Hunger Vital Sign    Worried About Running Out of Food in the Last Year: Never true    Ran Out of Food in the Last Year: Never true  Transportation Needs: Not on file (03/14/2023)  Physical Activity: Not on file  Stress: Not on file  Social Connections: Not on file  Intimate Partner Violence: Not on file    Family History  Problem Relation Age of Onset   Cancer Father        Lung   Diabetes Mother    COPD Mother    Cancer Maternal Uncle        Colon    Review of Systems:  As stated in the HPI and otherwise negative.   There were no vitals taken for this visit.  Physical Examination:  General: Well developed, well nourished, NAD  HEENT: OP clear, mucus membranes moist  SKIN: warm, dry. No rashes. Neuro: No focal deficits  Musculoskeletal: Muscle strength 5/5 all ext  Psychiatric: Mood and affect normal  Neck: No JVD, no carotid bruits, no thyromegaly, no lymphadenopathy.  Lungs:Clear bilaterally, no wheezes, rhonci, crackles Cardiovascular: Regular rate and rhythm. No murmurs, gallops or rubs. Abdomen:Soft. Bowel sounds present. Non-tender.  Extremities:  No lower extremity edema. Pulses are 2 + in the bilateral DP/PT.  EKG:  EKG is not *** ordered today. The ekg ordered today demonstrates   Recent Labs: 01/27/2023: Hemoglobin 14.2; Platelets 163 05/18/2023: BUN 14; Creatinine, Ser 1.33; Potassium 3.7; Sodium 139 08/20/2023: ALT 36   Lipid Panel Lipid Panel     Component Value Date/Time   CHOL 121 08/20/2023 0832   TRIG 265 (H) 08/20/2023 0832   HDL 36 (L) 08/20/2023 0832   CHOLHDL 3.4 08/20/2023 0832   CHOLHDL 6.6 12/20/2022 0945   VLDL 41 (H) 12/20/2022 0945   LDLCALC 44 08/20/2023 0832   LABVLDL 41 (H) 08/20/2023 0832     Wt Readings from  Last 3 Encounters:  06/22/23 99.8 kg  03/26/23 100.2 kg  02/19/23 101.1 kg    Assessment and Plan:   1. CAD s/p CABG without angina: Most recent cardiac cath in April 2024 at [facility] with balloon angioplasty of the restenosis in the stented segment of the vein graft to the RCA. No chest pain. *** Continue Plavix, statin and beta blocker.      2. PAD: Followed in [facility].   3. Tobacco abuse: He is still smoking 1/2 ppd. Smoking cessation is advised  4. Hyperlipidemia: Lipids followed in primary care. LDL ***. Continue statin and Repatha.   5. Chronic systolic CHF/Ischemic cardiomyopathy: LVEF 40% by echo in July 2024. Continue Coreg, Entresto, aldactone. *** BP and dizziness. ? Lasix.    6. History of LV thrombus: Continue Eliquis  7. Atrial fibrillation, paroxysmal: Sinus today. Continue Coreg and Eliquis.    Labs/ tests ordered today include:  No orders of the defined types were placed in this encounter.  Disposition:   F/U with me in 6  months  Signed, [name], MD 12/19/2023 7:35 AM    [facility] [address] [place], [place], [place]  [address] Phone: [phone]; Fax: [phone]

## 2024-02-20 NOTE — Progress Notes (Deleted)
 No chief complaint on file.  History of Present Illness: 58 yo male with history of ongoing tobacco abuse, CAD, HLD, PAD, HLD, ischemic cardiomyopathy and insulin dependent diabetes mellitus who is here today for cardiac follow up. He had an anterior STEMI in January 2013 with cardiac arrest and found to have an occluded mid LAD treated with a drug eluting stent. His post MI LVEF was 35%. Cardiac cath in November 2017 with severe stent restenosis in the LAD, treated with a cutting balloon angioplasty. He was admitted to [facility] in August 2018 with an anterior STEMI. His LAD was occluded proximal to the old stent and this was treated with a drug eluting stent. Angioplasty performed on the ostial Diagonal branch. LVEF was 40% following the MI. He had unstable angina in June 2020 and repeat cardiac cath showed severe restenosis in the mid LAD stented segment which was treated with cutting balloon angioplasty. He also had ostial stenosis of the diagonal branch (jailed by LAD stents), mild to moderate non-obstructive disease in the mid RCA and moderate LV systolic dysfunction with anteroapical hypokinesis (c/w findings on echo in 2018 at [facility]). LVEF=35-45%. I saw him in May 2021 and he c/o chest pain c/w unstable angina. Cardiac cath 04/22/20 with progression of disease in the LAD and RCA. He underwent 3V CABG 05/25/20. (LIMA to LAD, SVG to Diagonal and SVG to RCA). His post-operative course was complicated by atrial fibrillation and he was started on amiodarone. Echo with LVEF=30-35% with apical thrombus. He was discharged on Eliquis with a Lifevest. Echo post bypass with LVEF=40-45%. He was seen in our office 02/22/22 by Dr. Lynnette Caffey and reported chest pain. Imdur was started. He was then admitted to [facility] with chest pain and NSTEMI. Cardiac cath at [facility] April 2023 with severe disease in the SVG to RCA treated with a drug eluting stent. He was admitted to [facility]  in April 2023 with syncope x 3 and was Imdur was stopped. He was felt to be volume overloaded with c/o LE edema and abdominal swelling and was diuresed with Lasix. Echo April 2023 with LVEF=30-35% with apical akinesis. No LV thrombus. He was seen in the Advanced Heart Failure clinic following discharge. He was admitted to [facility] in April 2024 with a NSTEMI. Cardiac cath with atretic LIMA to LAD, moderate LAD stent restenosis, patent vein graft to the Diagonal and patent vein graft to the RCA with severe restenosis in the stent in the body of the vein graft to the RCA that was treated with cutting balloon angioplasty. Echo April 2024 at [facility] with LVEF=30-35%, mild MR, mild AI.   His PAD is followed in [facility] by Dr. Myra Gianotti. In 2013 he was found to have occlusion of his distal aorta and left subclavian artery. He underwent aortobifemoral bypass in February 2014 and left carotid to subclavian bypass in May 2014.  In April 2019, he underwent right femoral to AK popliteal bypass with saphenous vein and right femoral endarterectomy. In July 2022 he had drug coated balloon angioplasty of the right common femoral artery. He underwent repeat drug coated balloon angioplasty of the right common femoral artery in January 2024 and left femoral endarterectomy in February 2024.   He is here today for follow up. The patient denies any chest pain, dyspnea, palpitations, lower extremity edema, orthopnea, PND, dizziness, near syncope or syncope.   Primary Care Physician: Fleet Contras, MD  Past Medical History:  Diagnosis Date   Acute myocardial infarction of other anterior wall, subsequent episode of care 12/12/2011   Anal fissure    Arthritis    back   Atherosclerosis of native artery of both lower extremities with intermittent claudication (HCC) 04/01/2018   Bruises easily    d/t being on Effient   CAD (coronary artery disease)    A. Acute Ant STEMI 12/12/2011    Cardiomyopathy secondary  12/26/2011   Chronic systolic CHF (congestive heart failure) (HCC)    ischemic CM // Echo 6/21: EF 30-35  //  Echocardiogram 10/21: LV apical thrombus resolved, EF 50-55, ant and apical, ant-lat HK, trivial MR, trivial AI   Chronic total occlusion of artery of the extremities (HCC) 02/26/2012   Claudication (HCC) 12/26/2011   Coronary atherosclerosis of native coronary artery    a. ant STEMI with cardiac arrest 2013 s/p DES to mLAD. b. [place] 10/2016 s/p PTCA to mLAD.   Diabetes mellitus without complication (HCC)    Essential hypertension 10/23/2016   GERD (gastroesophageal reflux disease)    "takes tums"   History of blood transfusion    no abnormal reaction noted   History of kidney stones    Hypertension    Ischemic cardiomyopathy    a. EF 35% in 02/2012 at time of acute MI, improved to normal on subsequent imaging.   MI (myocardial infarction) (HCC)    AMI 1/13 - complicated by VT/Tosades   Mixed hyperlipidemia    Numbness and tingling of right arm 03/31/2013   Occlusion and stenosis of carotid artery without mention of cerebral infarction 08/26/2012   PAD (peripheral artery disease) (HCC)    followed by [facility] - aortobifem bypass 01/2013 & left carotid-subclavian artery bypass 04/2013   Peripheral vascular disease, unspecified (HCC) 12/16/2012   PVD (peripheral vascular disease) (HCC) 03/31/2013   Rectal polyp    Subclavian steal syndrome 08/11/2013   Uncontrolled diabetes mellitus 10/23/2016   Wears partial dentures    upper    Past Surgical History:  Procedure Laterality Date   ABDOMINAL AORTOGRAM W/LOWER EXTREMITY N/A 06/26/2017   Procedure: Abdominal Aortogram w/Lower Extremity;  Surgeon: Nada Libman, MD;  [facility]: [facility];  Service: Cardiovascular;  Laterality: N/A;   ABDOMINAL AORTOGRAM W/LOWER EXTREMITY N/A 06/28/2021   Procedure: ABDOMINAL AORTOGRAM W/LOWER EXTREMITY;  Surgeon: Nada Libman, MD;  Location: [facility];  Service: Cardiovascular;   Laterality: N/A;   ABDOMINAL AORTOGRAM W/LOWER EXTREMITY N/A 01/02/2023   Procedure: ABDOMINAL AORTOGRAM W/LOWER EXTREMITY;  Surgeon: Nada Libman, MD;  Location: [facility];  Service: Cardiovascular;  Laterality: N/A;   ANAL FISSURECTOMY     AORTA - BILATERAL FEMORAL ARTERY BYPASS GRAFT N/A 01/16/2013   Procedure: AORTA BIFEMORAL BYPASS GRAFT;  Surgeon: Nada Libman, MD;  Location: [facility] OR;  Service: Vascular;  Laterality: N/A;   APPENDECTOMY     CARDIAC CATHETERIZATION N/A 10/23/2016   Procedure: Left Heart Cath and Coronary Angiography;  Surgeon: Kathleene Hazel, MD;  Location: [facility] INVASIVE CV LAB;  Service: Cardiovascular;  Laterality: N/A;   CARDIAC CATHETERIZATION N/A 10/23/2016   Procedure: Coronary Balloon Angioplasty;  Surgeon: Kathleene Hazel, MD;  Location: [facility] INVASIVE CV LAB;  Service: Cardiovascular;  Laterality: N/A;   CAROTID-SUBCLAVIAN BYPASS GRAFT Left 04/03/2013   Procedure: BYPASS GRAFT CAROTID-SUBCLAVIAN;  Surgeon: Nada Libman, MD;  Location: [facility] OR;  Service: Vascular;  Laterality: Left;   CIRCUMCISION N/A 12/15/2015   Procedure: CIRCUMCISION ADULT;  Surgeon: Mardene Celeste  McKenzie, MD;  Location: [facility];  Service: Urology;  Laterality: N/A;   COLONOSCOPY     CORONARY ANGIOPLASTY     stent placed Dec 12, 2011 and 2018   CORONARY ARTERY BYPASS GRAFT N/A 05/05/2020   Procedure: CORONARY ARTERY BYPASS GRAFTING (CABG) x Three, Using Left internal Mammary Artery and Left Leg greater saphenous vein harvested endoscopically;  Surgeon: Delight Ovens, MD;  Location: [facility] OR;  Service: Open Heart Surgery;  Laterality: N/A;   CORONARY BALLOON ANGIOPLASTY N/A 05/23/2019   Procedure: CORONARY BALLOON ANGIOPLASTY;  Surgeon: Kathleene Hazel, MD;  [facility];  Service: Cardiovascular;  Laterality: N/A;   CORONARY PRESSURE/FFR STUDY N/A 04/22/2020   Procedure: INTRAVASCULAR PRESSURE WIRE/FFR STUDY;  Surgeon: Kathleene Hazel, MD;   [facility];  Service: Cardiovascular;  Laterality: N/A;   DIAGNOSTIC LAPAROSCOPY     ENDARTERECTOMY FEMORAL Right 03/13/2018   Procedure: FEMORAL ENDARTERECTOMY RIGHT;  Surgeon: Nada Libman, MD;  Location: [facility];  Service: Vascular;  Laterality: Right;   ENDARTERECTOMY FEMORAL Left 01/26/2023   Procedure: REDO LEFT COMMON FEMORAL ENDARTERECTOMY WITH VPA;  Surgeon: Nada Libman, MD;  Location: [facility] OR;  Service: Vascular;  Laterality: Left;   FEMORAL-POPLITEAL BYPASS GRAFT Right 03/13/2018   Procedure: FEMORAL-POPLITEAL ARTERY BYPASS WITH NON-REVERSE VEIN RIGHT;  Surgeon: Nada Libman, MD;  Location: [facility];  Service: Vascular;  Laterality: Right;   GROIN DISSECTION Right 03/13/2018   Procedure: RE-DO COMMON FEMORAL ARTERY EXPOSURE;  Surgeon: Nada Libman, MD;  Location: [facility];  Service: Vascular;  Laterality: Right;   I & D EXTREMITY Left 04/18/2013   Procedure: debridement of left neck lymphocele;  Surgeon: Nada Libman, MD;  Location: [facility] OR;  Service: Vascular;  Laterality: Left;  I and D of left neck   I & D EXTREMITY Right 11/21/2014   Procedure: IRRIGATION AND DEBRIDEMENT RIGHT HAND;  Surgeon: Dominica Severin, MD;  Location: [facility] OR;  Service: Orthopedics;  Laterality: Right;   LEFT HEART CATH AND CORONARY ANGIOGRAPHY N/A 05/23/2019   Procedure: LEFT HEART CATH AND CORONARY ANGIOGRAPHY;  Surgeon: Kathleene Hazel, MD;  [facility];  Service: Cardiovascular;  Laterality: N/A;   LEFT HEART CATH AND CORONARY ANGIOGRAPHY N/A 04/22/2020   Procedure: LEFT HEART CATH AND CORONARY ANGIOGRAPHY;  Surgeon: Kathleene Hazel, MD;  [facility];  Service: Cardiovascular;  Laterality: N/A;   LEFT HEART CATHETERIZATION WITH CORONARY ANGIOGRAM N/A 12/12/2011   Procedure: LEFT HEART CATHETERIZATION WITH CORONARY ANGIOGRAM;  Surgeon: Kathleene Hazel, MD;  Location: [facility] CATH LAB;  Service: Cardiovascular;  Laterality: N/A;   LOWER  EXTREMITY ANGIOGRAM N/A 01/31/2012   Procedure: LOWER EXTREMITY ANGIOGRAM;  Surgeon: Kathleene Hazel, MD;  Location: [facility] CATH LAB;  Service: Cardiovascular;  Laterality: N/A;   LOWER EXTREMITY ANGIOGRAPHY N/A 04/23/2018   Procedure: LOWER EXTREMITY ANGIOGRAPHY;  Surgeon: Nada Libman, MD;  [facility];  Service: Cardiovascular;  Laterality: N/A;   PATCH ANGIOPLASTY Left 01/26/2023   Procedure: PATCH ANGIOPLASTY USING HEMASHIELD PLATINUM FINESSE;  Surgeon: Nada Libman, MD;  Location[facility];  Service: Vascular;  Laterality: Left;   PERCUTANEOUS CORONARY STENT INTERVENTION (PCI-S) Right 12/12/2011   Procedure: PERCUTANEOUS CORONARY STENT INTERVENTION (PCI-S);  Surgeon: Kathleene Hazel, MD;  Location: [facility] CATH LAB;  Service: Cardiovascular;  Laterality: Right;   PERIPHERAL VASCULAR BALLOON ANGIOPLASTY Right 06/28/2021   Procedure: PERIPHERAL VASCULAR BALLOON ANGIOPLASTY;  Surgeon: Coral Else  W, MD;  [facility];  Service: Cardiovascular;  Laterality: Right;  common femoral (graft)   PERIPHERAL VASCULAR BALLOON ANGIOPLASTY  01/02/2023   Procedure: PERIPHERAL VASCULAR BALLOON ANGIOPLASTY;  Surgeon: Nada Libman, MD;  [facility];  Service: Cardiovascular;;   PERIPHERAL VASCULAR INTERVENTION Left 04/23/2018   Procedure: PERIPHERAL VASCULAR INTERVENTION;  Surgeon: Nada Libman, MD;  [facility];  Service: Cardiovascular;  Laterality: Left;  superficial femoral   REPAIR EXTENSOR TENDON Right 11/21/2014   Procedure: WITH REPAIR/RECONSTRUCTION OF EXTENSOR TENDONS AS NEEDED;  Surgeon: Dominica Severin, MD;  Location: [facility] OR;  Service: Orthopedics;  Laterality: Right;   TEE WITHOUT CARDIOVERSION N/A 05/05/2020   Procedure: TRANSESOPHAGEAL ECHOCARDIOGRAM (TEE);  Surgeon: Delight Ovens, MD;  Location: [facility] OR;  Service: Open Heart Surgery;  Laterality: N/A;    Current Outpatient Medications  Medication Sig Dispense  Refill   albuterol (VENTOLIN HFA) 108 (90 Base) MCG/ACT inhaler Inhale 1-2 puffs into the lungs every 6 (six) hours as needed for wheezing or shortness of breath.     aspirin EC 81 MG tablet Take 1 tablet (81 mg total) by mouth daily. 90 tablet 3   atorvastatin (LIPITOR) 80 MG tablet Take 80 mg by mouth daily at 6 PM.     carvedilol (COREG) 6.25 MG tablet Take 1.5 tablets (9.375 mg total) by mouth 2 (two) times daily. 270 tablet 3   cetirizine (ZYRTEC) 10 MG tablet Take 10 mg by mouth daily as needed for allergies.     clopidogrel (PLAVIX) 75 MG tablet Take 1 tablet (75 mg total) by mouth daily. 30 tablet 11   cyclobenzaprine (FLEXERIL) 10 MG tablet Take 1 tablet (10 mg total) by mouth 2 (two) times daily as needed for muscle spasms. 30 tablet 0   diazepam (VALIUM) 10 MG tablet Take 10 mg by mouth at bedtime.     ELIQUIS 5 MG TABS tablet TAKE 1 TABLET BY MOUTH 2 TIMES A DAY 180 tablet 1   Evolocumab (REPATHA SURECLICK) 140 MG/ML SOAJ Inject 140 mg into the skin every 14 (fourteen) days. 6 mL 3   ezetimibe (ZETIA) 10 MG tablet Take 1 tablet (10 mg total) by mouth daily. 30 tablet 11   FARXIGA 10 MG TABS tablet Take 10 mg by mouth daily.     furosemide (LASIX) 40 MG tablet Take 40 mg by mouth daily.  Can take extra tablet for wt gain of 3 lbs in 24 hour or 5 lbs in one week.     gabapentin (NEURONTIN) 300 MG capsule Take 300 mg by mouth 3 (three) times daily.     insulin glargine (LANTUS) 100 UNIT/ML Solostar Pen Inject 50 Units into the skin 2 (two) times daily.     insulin lispro (HUMALOG) 100 UNIT/ML injection Inject 5-15 Units into the skin 3 (three) times daily as needed (blood sugar over 150).     naloxone (NARCAN) nasal spray 4 mg/0.1 mL Place 4 mg into the nose once as needed (overdose).     nitroGLYCERIN (NITROSTAT) 0.4 MG SL tablet Place 0.4 mg under the tongue every 5 (five) minutes as needed for chest pain.     oxyCODONE-acetaminophen (PERCOCET) 10-325 MG tablet Take 1 tablet by mouth 4  (four) times daily.     pantoprazole (PROTONIX) 40 MG tablet Take 40 mg by mouth daily.     sacubitril-valsartan (ENTRESTO) 24-26 MG Take 1 tablet by mouth 2 (two) times daily. 180 tablet 3  spironolactone (ALDACTONE) 25 MG tablet Take 25 mg by mouth daily.     TRINTELLIX 20 MG TABS tablet Take 20 mg by mouth daily in the afternoon.     VASCEPA 1 g capsule Take 2 capsules (2 g total) by mouth 2 (two) times daily. 360 capsule 3   No current facility-administered medications for this visit.    Allergies  Allergen Reactions   Metformin Anaphylaxis    Water blisters between fingers   Hydrocodone Hives and Nausea And Vomiting    Social History   Socioeconomic History   Marital status: Divorced    Spouse name: Not on file   Number of children: 1   Years of education: Not on file   Highest education level: High school graduate  Occupational History    Employer: OTHER   Occupation: Disablity    Comment: Since 2013  Tobacco Use   Smoking status: Some Days    Current packs/day: 0.25    Average packs/day: 0.3 packs/day for 25.0 years (6.3 ttl pk-yrs)    Types: Cigarettes   Smokeless tobacco: Never   Tobacco comments:    Smoking cessation  Vaping Use   Vaping status: Never Used  Substance and Sexual Activity   Alcohol use: Yes    Alcohol/week: 2.0 standard drinks of alcohol    Types: 2 Cans of beer per week    Comment: socially   Drug use: No   Sexual activity: Yes  Other Topics Concern   Not on file  Social History Narrative   Disabled - no longer works   Social Drivers of Corporate investment banker Strain: Low Risk  (03/30/2022)   Overall Financial Resource Strain (CARDIA)    Difficulty of Paying Living Expenses: Not very hard  Food Insecurity: Low Risk  (03/14/2023)   [facility]   Hunger Vital Sign    Worried About Running Out of Food in the Last Year: Never true    Ran Out of Food in the Last Year: Never true  Transportation Needs:  Not on file (03/14/2023)  Physical Activity: Not on file  Stress: Not on file  Social Connections: Not on file  Intimate Partner Violence: Not on file    Family History  Problem Relation Age of Onset   Cancer Father        Lung   Diabetes Mother    COPD Mother    Cancer Maternal Uncle        Colon    Review of Systems:  As stated in the HPI and otherwise negative.   There were no vitals taken for this visit.  Physical Examination: General: Well developed, well nourished, NAD  HEENT: OP clear, mucus membranes moist  SKIN: warm, dry. No rashes. Neuro: No focal deficits  Musculoskeletal: Muscle strength 5/5 all ext  Psychiatric: Mood and affect normal  Neck: No JVD, no carotid bruits, no thyromegaly, no lymphadenopathy.  Lungs:Clear bilaterally, no wheezes, rhonci, crackles Cardiovascular: Regular rate and rhythm. No murmurs, gallops or rubs. Abdomen:Soft. Bowel sounds present. Non-tender.  Extremities: No lower extremity edema. Pulses are 2 + in the bilateral DP/PT.  EKG:  EKG is *** ordered today. The ekg ordered today demonstrates   Recent Labs: 05/18/2023: BUN 14; Creatinine, Ser 1.33; Potassium 3.7; Sodium 139 08/20/2023: ALT 36   Lipid Panel Lipid Panel     Component Value Date/Time   CHOL 121 08/20/2023 0832   TRIG 265 (H) 08/20/2023 0832   HDL 36 (L)  08/20/2023 0832   CHOLHDL 3.4 08/20/2023 0832   CHOLHDL 6.6 12/20/2022 0945   VLDL 41 (H) 12/20/2022 0945   LDLCALC 44 08/20/2023 0832   LABVLDL 41 (H) 08/20/2023 0832     Wt Readings from Last 3 Encounters:  06/22/23 99.8 kg  03/26/23 100.2 kg  02/19/23 101.1 kg    Assessment and Plan:  *** plan from here *** 1. CAD s/p CABG without angina: Most recent cardiac cath in April 2023 at [facility] with stenting of the SVG to RCA. NO chest pain suggestive of angina. Will continue ASA, statin and beta blocker.     2. PAD: Followed in [facility].   3. Tobacco abuse: He is still smoking 1/2 ppd  4.  Hyperlipidemia: Lipids followed in primary care. Continue statin  5. Chronic systolic CHF/Ischemic cardiomyopathy: LVEF 45-50% by echo in August 2023. Weight is stable. No volume overload on exam Continue beta blocker. Entresto, aldactone and Lasix.   Given soft BP at dizziness at times, will reduce Coreg to 12.5 mg po BID.   6. History of LV thrombus: Continue Eliquis  7. Atrial fibrillation, paroxysmal: Sinus today on exam. Continue beta blocker and Eliquis.   Labs/ tests ordered today include:  No orders of the defined types were placed in this encounter.  Disposition:   F/U with me in 6  months  Signed, [name], MD 02/20/2024 1:17 PM    [facility] [address] [place], [place], [place]  [address] Phone: [phone]; Fax: [phone]

## 2024-02-21 ENCOUNTER — Ambulatory Visit: Attending: Cardiovascular Disease | Admitting: Cardiovascular Disease

## 2024-02-22 ENCOUNTER — Encounter: Payer: Self-pay | Admitting: Cardiovascular Disease

## 2024-03-01 ENCOUNTER — Emergency Department (HOSPITAL_COMMUNITY)

## 2024-03-01 ENCOUNTER — Inpatient Hospital Stay (HOSPITAL_COMMUNITY)
Admission: EM | Admit: 2024-03-01 | Discharge: 2024-03-06 | DRG: 291 | Disposition: A | Discharge status: Home / Self Care | Attending: Cardiology | Admitting: Cardiology

## 2024-03-01 ENCOUNTER — Encounter (HOSPITAL_COMMUNITY): Payer: Self-pay

## 2024-03-01 ENCOUNTER — Other Ambulatory Visit: Payer: Self-pay

## 2024-03-01 DIAGNOSIS — Z794 Long term (current) use of insulin: Secondary | ICD-10-CM | POA: Diagnosis not present

## 2024-03-01 DIAGNOSIS — I5042 Chronic combined systolic (congestive) and diastolic (congestive) heart failure: Secondary | ICD-10-CM

## 2024-03-01 DIAGNOSIS — I480 Paroxysmal atrial fibrillation: Secondary | ICD-10-CM | POA: Diagnosis present

## 2024-03-01 DIAGNOSIS — E1169 Type 2 diabetes mellitus with other specified complication: Secondary | ICD-10-CM | POA: Diagnosis present

## 2024-03-01 DIAGNOSIS — Z79899 Other long term (current) drug therapy: Secondary | ICD-10-CM | POA: Diagnosis not present

## 2024-03-01 DIAGNOSIS — Z955 Presence of coronary angioplasty implant and graft: Secondary | ICD-10-CM

## 2024-03-01 DIAGNOSIS — I252 Old myocardial infarction: Secondary | ICD-10-CM

## 2024-03-01 DIAGNOSIS — I255 Ischemic cardiomyopathy: Secondary | ICD-10-CM | POA: Diagnosis present

## 2024-03-01 DIAGNOSIS — I5043 Acute on chronic combined systolic (congestive) and diastolic (congestive) heart failure: Secondary | ICD-10-CM | POA: Diagnosis present

## 2024-03-01 DIAGNOSIS — K219 Gastro-esophageal reflux disease without esophagitis: Secondary | ICD-10-CM | POA: Diagnosis present

## 2024-03-01 DIAGNOSIS — I10 Essential (primary) hypertension: Secondary | ICD-10-CM | POA: Diagnosis present

## 2024-03-01 DIAGNOSIS — R570 Cardiogenic shock: Secondary | ICD-10-CM | POA: Diagnosis present

## 2024-03-01 DIAGNOSIS — Z6831 Body mass index (BMI) 31.0-31.9, adult: Secondary | ICD-10-CM | POA: Diagnosis not present

## 2024-03-01 DIAGNOSIS — N179 Acute kidney failure, unspecified: Secondary | ICD-10-CM | POA: Diagnosis present

## 2024-03-01 DIAGNOSIS — I739 Peripheral vascular disease, unspecified: Secondary | ICD-10-CM | POA: Diagnosis not present

## 2024-03-01 DIAGNOSIS — Z87442 Personal history of urinary calculi: Secondary | ICD-10-CM

## 2024-03-01 DIAGNOSIS — I959 Hypotension, unspecified: Secondary | ICD-10-CM | POA: Diagnosis not present

## 2024-03-01 DIAGNOSIS — Z8674 Personal history of sudden cardiac arrest: Secondary | ICD-10-CM

## 2024-03-01 DIAGNOSIS — I5023 Acute on chronic systolic (congestive) heart failure: Secondary | ICD-10-CM | POA: Diagnosis not present

## 2024-03-01 DIAGNOSIS — E785 Hyperlipidemia, unspecified: Secondary | ICD-10-CM

## 2024-03-01 DIAGNOSIS — E782 Mixed hyperlipidemia: Secondary | ICD-10-CM | POA: Diagnosis present

## 2024-03-01 DIAGNOSIS — I2510 Atherosclerotic heart disease of native coronary artery without angina pectoris: Secondary | ICD-10-CM | POA: Diagnosis present

## 2024-03-01 DIAGNOSIS — Z7982 Long term (current) use of aspirin: Secondary | ICD-10-CM

## 2024-03-01 DIAGNOSIS — E119 Type 2 diabetes mellitus without complications: Secondary | ICD-10-CM

## 2024-03-01 DIAGNOSIS — Z7902 Long term (current) use of antithrombotics/antiplatelets: Secondary | ICD-10-CM

## 2024-03-01 DIAGNOSIS — Z833 Family history of diabetes mellitus: Secondary | ICD-10-CM | POA: Diagnosis not present

## 2024-03-01 DIAGNOSIS — R34 Anuria and oliguria: Secondary | ICD-10-CM | POA: Diagnosis present

## 2024-03-01 DIAGNOSIS — E872 Acidosis, unspecified: Secondary | ICD-10-CM | POA: Diagnosis present

## 2024-03-01 DIAGNOSIS — Z885 Allergy status to narcotic agent status: Secondary | ICD-10-CM

## 2024-03-01 DIAGNOSIS — Z951 Presence of aortocoronary bypass graft: Secondary | ICD-10-CM

## 2024-03-01 DIAGNOSIS — I5021 Acute systolic (congestive) heart failure: Secondary | ICD-10-CM | POA: Diagnosis not present

## 2024-03-01 DIAGNOSIS — N1831 Chronic kidney disease, stage 3a: Secondary | ICD-10-CM | POA: Diagnosis present

## 2024-03-01 DIAGNOSIS — Z7901 Long term (current) use of anticoagulants: Secondary | ICD-10-CM | POA: Diagnosis not present

## 2024-03-01 DIAGNOSIS — I509 Heart failure, unspecified: Secondary | ICD-10-CM

## 2024-03-01 DIAGNOSIS — E66811 Obesity, class 1: Secondary | ICD-10-CM

## 2024-03-01 DIAGNOSIS — E1122 Type 2 diabetes mellitus with diabetic chronic kidney disease: Secondary | ICD-10-CM | POA: Diagnosis present

## 2024-03-01 DIAGNOSIS — J449 Chronic obstructive pulmonary disease, unspecified: Secondary | ICD-10-CM | POA: Diagnosis present

## 2024-03-01 DIAGNOSIS — I483 Typical atrial flutter: Secondary | ICD-10-CM | POA: Diagnosis present

## 2024-03-01 DIAGNOSIS — F17210 Nicotine dependence, cigarettes, uncomplicated: Secondary | ICD-10-CM | POA: Diagnosis present

## 2024-03-01 DIAGNOSIS — E118 Type 2 diabetes mellitus with unspecified complications: Secondary | ICD-10-CM | POA: Diagnosis present

## 2024-03-01 DIAGNOSIS — J432 Centrilobular emphysema: Secondary | ICD-10-CM | POA: Diagnosis present

## 2024-03-01 DIAGNOSIS — I4892 Unspecified atrial flutter: Principal | ICD-10-CM

## 2024-03-01 DIAGNOSIS — I130 Hypertensive heart and chronic kidney disease with heart failure and stage 1 through stage 4 chronic kidney disease, or unspecified chronic kidney disease: Principal | ICD-10-CM | POA: Diagnosis present

## 2024-03-01 DIAGNOSIS — E1151 Type 2 diabetes mellitus with diabetic peripheral angiopathy without gangrene: Secondary | ICD-10-CM | POA: Diagnosis present

## 2024-03-01 DIAGNOSIS — Z888 Allergy status to other drugs, medicaments and biological substances status: Secondary | ICD-10-CM

## 2024-03-01 LAB — URINALYSIS, W/ REFLEX TO CULTURE (INFECTION SUSPECTED)
Bacteria, UA: NONE SEEN
Bilirubin Urine: NEGATIVE
Glucose, UA: >=500 mg/dL — AB
Hgb urine dipstick: NEGATIVE
Ketones, ur: NEGATIVE mg/dL
Leukocytes,Ua: NEGATIVE
Nitrite: NEGATIVE
Protein, ur: 100 mg/dL — AB
Specific Gravity, Urine: 1.009 (ref 1.005–1.030)
pH: 6 (ref 5.0–8.0)

## 2024-03-01 LAB — CBC
HCT: 49.3 % (ref 39.0–52.0)
HCT: 48.3 % (ref 39.0–52.0)
Hemoglobin: 16.1 g/dL (ref 13.0–17.0)
Hemoglobin: 15.9 g/dL (ref 13.0–17.0)
MCH: 33 pg (ref 26.0–34.0)
MCH: 33.1 pg (ref 26.0–34.0)
MCHC: 32.7 g/dL (ref 30.0–36.0)
MCHC: 32.9 g/dL (ref 30.0–36.0)
MCV: 101 fL — ABNORMAL HIGH (ref 80.0–100.0)
MCV: 100.4 fL — ABNORMAL HIGH (ref 80.0–100.0)
Platelets: 171 10*3/uL (ref 150–400)
Platelets: 194 10*3/uL (ref 150–400)
RBC: 4.88 MIL/uL (ref 4.22–5.81)
RBC: 4.81 MIL/uL (ref 4.22–5.81)
RDW: 14.2 % (ref 11.5–15.5)
RDW: 14.1 % (ref 11.5–15.5)
WBC: 8.2 10*3/uL (ref 4.0–10.5)
WBC: 8.5 10*3/uL (ref 4.0–10.5)
nRBC: 0 % (ref 0.0–0.2)
nRBC: 0 % (ref 0.0–0.2)

## 2024-03-01 LAB — I-STAT CHEM 8, ED
BUN: 25 mg/dL — ABNORMAL HIGH (ref 6–20)
Calcium, Ion: 1.1 mmol/L — ABNORMAL LOW (ref 1.15–1.40)
Chloride: 101 mmol/L (ref 98–111)
Creatinine, Ser: 1.5 mg/dL — ABNORMAL HIGH (ref 0.61–1.24)
Glucose, Bld: 177 mg/dL — ABNORMAL HIGH (ref 70–99)
HCT: 50 % (ref 39.0–52.0)
Hemoglobin: 17 g/dL (ref 13.0–17.0)
Potassium: 4.5 mmol/L (ref 3.5–5.1)
Sodium: 134 mmol/L — ABNORMAL LOW (ref 135–145)
TCO2: 23 mmol/L (ref 22–32)

## 2024-03-01 LAB — CBG MONITORING, ED: Glucose-Capillary: 344 mg/dL — ABNORMAL HIGH (ref 70–99)

## 2024-03-01 LAB — HIV ANTIBODY (ROUTINE TESTING W REFLEX): HIV Screen 4th Generation wRfx: NONREACTIVE

## 2024-03-01 LAB — I-STAT VENOUS BLOOD GAS, ED
Acid-Base Excess: 0 mmol/L (ref 0.0–2.0)
Bicarbonate: 24.3 mmol/L (ref 20.0–28.0)
Calcium, Ion: 1.09 mmol/L — ABNORMAL LOW (ref 1.15–1.40)
HCT: 48 % (ref 39.0–52.0)
Hemoglobin: 16.3 g/dL (ref 13.0–17.0)
O2 Saturation: 81 %
Potassium: 4.5 mmol/L (ref 3.5–5.1)
Sodium: 134 mmol/L — ABNORMAL LOW (ref 135–145)
TCO2: 25 mmol/L (ref 22–32)
pCO2, Ven: 37.8 mmHg — ABNORMAL LOW (ref 44–60)
pH, Ven: 7.415 (ref 7.25–7.43)
pO2, Ven: 44 mmHg (ref 32–45)

## 2024-03-01 LAB — BASIC METABOLIC PANEL WITH GFR
Anion gap: 8 (ref 5–15)
Anion gap: 10 (ref 5–15)
BUN: 19 mg/dL (ref 6–20)
BUN: 28 mg/dL — ABNORMAL HIGH (ref 6–20)
CO2: 26 mmol/L (ref 22–32)
CO2: 23 mmol/L (ref 22–32)
Calcium: 8.8 mg/dL — ABNORMAL LOW (ref 8.9–10.3)
Calcium: 8.4 mg/dL — ABNORMAL LOW (ref 8.9–10.3)
Chloride: 100 mmol/L (ref 98–111)
Chloride: 98 mmol/L (ref 98–111)
Creatinine, Ser: 1.53 mg/dL — ABNORMAL HIGH (ref 0.61–1.24)
Creatinine, Ser: 2.21 mg/dL — ABNORMAL HIGH (ref 0.61–1.24)
GFR, Estimated: 53 mL/min — ABNORMAL LOW (ref >60)
GFR, Estimated: 34 mL/min — ABNORMAL LOW (ref >60)
Glucose, Bld: 189 mg/dL — ABNORMAL HIGH (ref 70–99)
Glucose, Bld: 122 mg/dL — ABNORMAL HIGH (ref 70–99)
Potassium: 4.7 mmol/L (ref 3.5–5.1)
Potassium: 4.6 mmol/L (ref 3.5–5.1)
Sodium: 134 mmol/L — ABNORMAL LOW (ref 135–145)
Sodium: 131 mmol/L — ABNORMAL LOW (ref 135–145)

## 2024-03-01 LAB — TROPONIN I (HIGH SENSITIVITY)
Troponin I (High Sensitivity): 25 ng/L — ABNORMAL HIGH (ref <18)
Troponin I (High Sensitivity): 25 ng/L — ABNORMAL HIGH (ref <18)
Troponin I (High Sensitivity): 32 ng/L — ABNORMAL HIGH (ref <18)

## 2024-03-01 LAB — GLUCOSE, CAPILLARY
Glucose-Capillary: 154 mg/dL — ABNORMAL HIGH (ref 70–99)
Glucose-Capillary: 144 mg/dL — ABNORMAL HIGH (ref 70–99)

## 2024-03-01 LAB — PROTIME-INR
INR: 1.5 — ABNORMAL HIGH (ref 0.8–1.2)
Prothrombin Time: 18.2 s — ABNORMAL HIGH (ref 11.4–15.2)

## 2024-03-01 LAB — HEMOGLOBIN A1C
Hgb A1c MFr Bld: 8.9 % — ABNORMAL HIGH (ref 4.8–5.6)
Mean Plasma Glucose: 208.73 mg/dL

## 2024-03-01 LAB — LACTIC ACID, PLASMA: Lactic Acid, Venous: 1.4 mmol/L (ref 0.5–1.9)

## 2024-03-01 LAB — MRSA NEXT GEN BY PCR, NASAL: MRSA by PCR Next Gen: NOT DETECTED

## 2024-03-01 LAB — I-STAT CG4 LACTIC ACID, ED
Lactic Acid, Venous: 1 mmol/L (ref 0.5–1.9)
Lactic Acid, Venous: 1 mmol/L (ref 0.5–1.9)

## 2024-03-01 LAB — PROCALCITONIN: Procalcitonin: 0.1 ng/mL

## 2024-03-01 LAB — MAGNESIUM
Magnesium: 2.2 mg/dL (ref 1.7–2.4)
Magnesium: 2.2 mg/dL (ref 1.7–2.4)

## 2024-03-01 LAB — PHOSPHORUS: Phosphorus: 5.1 mg/dL — ABNORMAL HIGH (ref 2.5–4.6)

## 2024-03-01 LAB — BRAIN NATRIURETIC PEPTIDE: B Natriuretic Peptide: 1074.8 pg/mL — ABNORMAL HIGH (ref 0.0–100.0)

## 2024-03-01 MED ORDER — ONDANSETRON HCL 4 MG/2ML IJ SOLN
4.0000 mg | Freq: Four times a day (QID) | INTRAMUSCULAR | Status: DC | PRN
Start: 2024-03-01 — End: 2024-03-06
  Administered 2024-03-01: 4 mg via INTRAVENOUS
  Filled 2024-03-01: qty 2

## 2024-03-01 MED ORDER — OXYCODONE HCL 5 MG PO TABS
5.0000 mg | ORAL_TABLET | Freq: Four times a day (QID) | ORAL | Status: DC | PRN
  Administered 2024-03-02 – 2024-03-05 (×7): 5 mg via ORAL
  Filled 2024-03-01 (×8): qty 1

## 2024-03-01 MED ORDER — ONDANSETRON HCL 4 MG PO TABS: 4.0000 mg | ORAL_TABLET | Freq: Four times a day (QID) | ORAL | Status: DC | PRN

## 2024-03-01 MED ORDER — OXYCODONE-ACETAMINOPHEN 5-325 MG PO TABS
1.0000 | ORAL_TABLET | Freq: Four times a day (QID) | ORAL | Status: DC
  Administered 2024-03-01 (×4): 1 via ORAL
  Filled 2024-03-01 (×4): qty 1

## 2024-03-01 MED ORDER — NOREPINEPHRINE 4 MG/250ML-% IV SOLN
0.0000 ug/min | INTRAVENOUS | Status: DC
  Administered 2024-03-02: 4 ug/min via INTRAVENOUS
  Administered 2024-03-03: 2 ug/min via INTRAVENOUS
  Administered 2024-03-03: 6 ug/min via INTRAVENOUS
  Filled 2024-03-01 (×3): qty 250

## 2024-03-01 MED ORDER — FUROSEMIDE 10 MG/ML IJ SOLN
80.0000 mg | Freq: Two times a day (BID) | INTRAMUSCULAR | Status: DC
  Administered 2024-03-01: 80 mg via INTRAVENOUS
  Filled 2024-03-01 (×2): qty 8

## 2024-03-01 MED ORDER — AMIODARONE HCL IN DEXTROSE 360-4.14 MG/200ML-% IV SOLN
30.0000 mg/h | INTRAVENOUS | Status: DC
  Administered 2024-03-01 – 2024-03-03 (×6): 30 mg/h via INTRAVENOUS
  Filled 2024-03-01 (×5): qty 200

## 2024-03-01 MED ORDER — SPIRONOLACTONE 25 MG PO TABS
25.0000 mg | ORAL_TABLET | Freq: Every day | ORAL | Status: DC
  Administered 2024-03-01: 25 mg via ORAL
  Filled 2024-03-01: qty 1

## 2024-03-01 MED ORDER — CYCLOBENZAPRINE HCL 10 MG PO TABS
10.0000 mg | ORAL_TABLET | Freq: Two times a day (BID) | ORAL | Status: DC | PRN
  Administered 2024-03-03 – 2024-03-06 (×2): 10 mg via ORAL
  Filled 2024-03-01 (×2): qty 1

## 2024-03-01 MED ORDER — OXYCODONE-ACETAMINOPHEN 5-325 MG PO TABS
1.0000 | ORAL_TABLET | Freq: Three times a day (TID) | ORAL | Status: DC | PRN
  Administered 2024-03-02 – 2024-03-05 (×8): 1 via ORAL
  Filled 2024-03-01 (×9): qty 1

## 2024-03-01 MED ORDER — ACETAMINOPHEN 650 MG RE SUPP: 650.0000 mg | Freq: Four times a day (QID) | RECTAL | Status: DC | PRN

## 2024-03-01 MED ORDER — AMIODARONE HCL IN DEXTROSE 360-4.14 MG/200ML-% IV SOLN
60.0000 mg/h | INTRAVENOUS | Status: DC
Start: 2024-03-01 — End: 2024-03-01
  Administered 2024-03-01 (×2): 60 mg/h via INTRAVENOUS
  Filled 2024-03-01 (×2): qty 200

## 2024-03-01 MED ORDER — OXYCODONE HCL 5 MG PO TABS
5.0000 mg | ORAL_TABLET | Freq: Four times a day (QID) | ORAL | Status: DC
  Administered 2024-03-01 (×4): 5 mg via ORAL
  Filled 2024-03-01 (×4): qty 1

## 2024-03-01 MED ORDER — DIAZEPAM 5 MG PO TABS: 10.0000 mg | ORAL_TABLET | Freq: Every day | ORAL | Status: DC

## 2024-03-01 MED ORDER — CLOPIDOGREL BISULFATE 75 MG PO TABS
75.0000 mg | ORAL_TABLET | Freq: Every day | ORAL | Status: DC
  Administered 2024-03-01 – 2024-03-06 (×6): 75 mg via ORAL
  Filled 2024-03-01 (×6): qty 1

## 2024-03-01 MED ORDER — FUROSEMIDE 10 MG/ML IJ SOLN: 60.0000 mg | Freq: Two times a day (BID) | INTRAMUSCULAR | Status: DC

## 2024-03-01 MED ORDER — FUROSEMIDE 10 MG/ML IJ SOLN
20.0000 mg | Freq: Two times a day (BID) | INTRAMUSCULAR | Status: DC
Start: 2024-03-01 — End: 2024-03-01

## 2024-03-01 MED ORDER — INSULIN ASPART 100 UNIT/ML IJ SOLN
0.0000 [IU] | Freq: Three times a day (TID) | INTRAMUSCULAR | Status: DC
  Administered 2024-03-01: 2 [IU] via SUBCUTANEOUS
  Administered 2024-03-01: 7 [IU] via SUBCUTANEOUS
  Administered 2024-03-02: 3 [IU] via SUBCUTANEOUS

## 2024-03-01 MED ORDER — APIXABAN 5 MG PO TABS
5.0000 mg | ORAL_TABLET | Freq: Two times a day (BID) | ORAL | Status: DC
  Administered 2024-03-01 – 2024-03-06 (×11): 5 mg via ORAL
  Filled 2024-03-01 (×11): qty 1

## 2024-03-01 MED ORDER — OXYCODONE-ACETAMINOPHEN 10-325 MG PO TABS: 1.0000 | ORAL_TABLET | Freq: Four times a day (QID) | ORAL | Status: DC

## 2024-03-01 MED ORDER — NOREPINEPHRINE 4 MG/250ML-% IV SOLN
INTRAVENOUS | Status: AC
Start: 2024-03-01 — End: 2024-03-01
  Administered 2024-03-01: 2 ug/min via INTRAVENOUS
  Filled 2024-03-01: qty 250

## 2024-03-01 MED ORDER — ACETAMINOPHEN 325 MG PO TABS: 650.0000 mg | ORAL_TABLET | Freq: Four times a day (QID) | ORAL | Status: DC | PRN

## 2024-03-01 MED ORDER — FUROSEMIDE 10 MG/ML IJ SOLN: 20.0000 mg | Freq: Two times a day (BID) | INTRAMUSCULAR | Status: DC

## 2024-03-01 MED ORDER — MILRINONE LACTATE IN DEXTROSE 20-5 MG/100ML-% IV SOLN
0.1250 ug/kg/min | INTRAVENOUS | Status: AC
  Administered 2024-03-01 – 2024-03-03 (×4): 0.25 ug/kg/min via INTRAVENOUS
  Administered 2024-03-04 – 2024-03-05 (×2): 0.125 ug/kg/min via INTRAVENOUS
  Filled 2024-03-01 (×6): qty 100

## 2024-03-01 MED ORDER — EZETIMIBE 10 MG PO TABS
10.0000 mg | ORAL_TABLET | Freq: Every day | ORAL | Status: DC
  Administered 2024-03-01 – 2024-03-06 (×6): 10 mg via ORAL
  Filled 2024-03-01 (×6): qty 1

## 2024-03-01 MED ORDER — DAPAGLIFLOZIN PROPANEDIOL 10 MG PO TABS
10.0000 mg | ORAL_TABLET | Freq: Every day | ORAL | Status: DC
Start: 2024-03-01 — End: 2024-03-01
  Filled 2024-03-01: qty 1

## 2024-03-01 MED ORDER — ASPIRIN 81 MG PO TBEC
81.0000 mg | DELAYED_RELEASE_TABLET | Freq: Every day | ORAL | Status: DC
Start: 2024-03-01 — End: 2024-03-01
  Filled 2024-03-01: qty 1

## 2024-03-01 MED ORDER — DIAZEPAM 5 MG PO TABS
5.0000 mg | ORAL_TABLET | Freq: Every day | ORAL | Status: DC
  Administered 2024-03-01 – 2024-03-05 (×4): 5 mg via ORAL
  Filled 2024-03-01 (×4): qty 1

## 2024-03-01 MED ORDER — ETOMIDATE 2 MG/ML IV SOLN: 8.0000 mg | Freq: Once | INTRAVENOUS | Status: DC

## 2024-03-01 MED ORDER — CHLORHEXIDINE GLUCONATE CLOTH 2 % EX PADS
6.0000 | MEDICATED_PAD | Freq: Every day | CUTANEOUS | Status: DC
Start: 2024-03-01 — End: 2024-03-06
  Administered 2024-03-01 – 2024-03-06 (×6): 6 via TOPICAL

## 2024-03-01 MED ORDER — ATORVASTATIN CALCIUM 80 MG PO TABS
80.0000 mg | ORAL_TABLET | Freq: Every day | ORAL | Status: DC
  Administered 2024-03-01 – 2024-03-05 (×5): 80 mg via ORAL
  Filled 2024-03-01 (×5): qty 1

## 2024-03-01 MED ORDER — NICOTINE 21 MG/24HR TD PT24
21.0000 mg | MEDICATED_PATCH | Freq: Every day | TRANSDERMAL | Status: DC
  Administered 2024-03-01 – 2024-03-06 (×6): 21 mg via TRANSDERMAL
  Filled 2024-03-01 (×7): qty 1

## 2024-03-01 MED ORDER — PANTOPRAZOLE SODIUM 40 MG PO TBEC
40.0000 mg | DELAYED_RELEASE_TABLET | Freq: Every day | ORAL | Status: DC
  Administered 2024-03-01 – 2024-03-06 (×6): 40 mg via ORAL
  Filled 2024-03-01 (×6): qty 1

## 2024-03-01 MED ORDER — FUROSEMIDE 10 MG/ML IJ SOLN
80.0000 mg | Freq: Once | INTRAMUSCULAR | Status: AC
  Administered 2024-03-01: 80 mg via INTRAVENOUS

## 2024-03-01 MED ORDER — LORATADINE 10 MG PO TABS
10.0000 mg | ORAL_TABLET | Freq: Every day | ORAL | Status: DC
  Administered 2024-03-01 – 2024-03-06 (×6): 10 mg via ORAL
  Filled 2024-03-01 (×6): qty 1

## 2024-03-01 MED ORDER — NITROGLYCERIN 0.4 MG SL SUBL: 0.4000 mg | SUBLINGUAL_TABLET | SUBLINGUAL | Status: DC | PRN

## 2024-03-01 MED ORDER — ALBUTEROL SULFATE (2.5 MG/3ML) 0.083% IN NEBU: 2.5000 mg | INHALATION_SOLUTION | Freq: Four times a day (QID) | RESPIRATORY_TRACT | Status: DC | PRN

## 2024-03-01 MED ORDER — AMIODARONE LOAD VIA INFUSION
150.0000 mg | Freq: Once | INTRAVENOUS | Status: AC
  Administered 2024-03-01: 150 mg via INTRAVENOUS
  Filled 2024-03-01: qty 83.34

## 2024-03-01 MED ORDER — ALBUTEROL SULFATE HFA 108 (90 BASE) MCG/ACT IN AERS: 1.0000 | INHALATION_SPRAY | Freq: Four times a day (QID) | RESPIRATORY_TRACT | Status: DC | PRN

## 2024-03-01 MED ORDER — GABAPENTIN 300 MG PO CAPS
300.0000 mg | ORAL_CAPSULE | Freq: Three times a day (TID) | ORAL | Status: DC
Start: 2024-03-01 — End: 2024-03-02
  Administered 2024-03-01 – 2024-03-02 (×4): 300 mg via ORAL
  Filled 2024-03-01 (×4): qty 1

## 2024-03-01 MED ORDER — FUROSEMIDE 10 MG/ML IJ SOLN
60.0000 mg | Freq: Two times a day (BID) | INTRAMUSCULAR | Status: DC
  Filled 2024-03-01: qty 6

## 2024-03-01 NOTE — Assessment & Plan Note (Signed)
 No signs of acute exacerbation, continue oxymetry monitoring.  As needed bronchodilatory therapy with albuterol.

## 2024-03-01 NOTE — ED Notes (Signed)
 Not in room-lab

## 2024-03-01 NOTE — Assessment & Plan Note (Signed)
 Calculated BMI is 31.0

## 2024-03-01 NOTE — ED Triage Notes (Signed)
 Pt c/o chest pain, shortness of breath and increased fluid retention in legs for past week. Pt states he took lasix without improvement. Pt denies nausea/vomiting. Pt states he is unable to lay down d/t not being able to breathe.

## 2024-03-01 NOTE — Assessment & Plan Note (Signed)
 Continue blood pressure monitoring, considering low EF will target MAP of 60 or greater.  His lower extremities are warm and lactic acid is 1,0 No clinical signs of cardiogenic shock.

## 2024-03-01 NOTE — Consult Note (Addendum)
 NAME:  Ryan [name], MRN:  [id number], DOB:  Mar 28, 1966, LOS: 0 ADMISSION DATE:  03/01/2024, CONSULTATION DATE:  03/01/24 REFERRING MD:  Dr. Janalyn Shy, CHIEF COMPLAINT:  hypotension   History of Present Illness:  58 year old gentleman with past medical history of CAD status post CABG 2021, multiple PCI's pre and post CABG most recent 2023,  ischemic cardiomyopathy last known EF 35%, PAD status post aorto Pham pop bypass, carotid to subclavian bypass, diabetes hypertension COPD coming in with new onset dyspnea, orthopnea, lower extremity edema and self-reported 30 pound weight gain.  He was found to be in atrial flutter with rates in the 140s elevated BNP. he was admitted to the hospitalist service and was seen by electrophysiology started on amnio drip, IV Lasix.  Plan was to perform cardioversion on Monday if the patient continued to remain in atrial flutter.  ICU was called because patient had borderline blood pressures with maps in the mid 60s and hospitalist was concerned about continuing diuretics and potential need for inotropes.  On assessment of the patient, he reports that his breathing is worse when he is laying down and the weight gain.  Otherwise he feels relatively asymptomatic.  He reports he has not urinated much since coming in.   Pertinent  Medical History  As above  Significant Hospital Events: Including procedures, antibiotic start and stop dates in addition to other pertinent events   3/29: Started on amnio drip, seen by EP given Lasix 80 Mg IV.  Interim History / Subjective:  Feels breathing is baseline, reports worsening edema  Objective   Blood pressure (!) 73/58, pulse 63, temperature 98 F (36.7 C), temperature source Oral, resp. rate 20, height 5\' 9"  (1.753 m), weight 110.1 kg, SpO2 100%.    FiO2 (%):  [3 %] 3 %   Intake/Output Summary (Last 24 hours) at 03/01/2024 2138 Last data filed at 03/01/2024 2100 Gross per 24 hour  Intake 465.29 ml  Output 285 ml  Net  180.29 ml   Filed Weights   03/01/24 0540 03/01/24 1700  Weight: 95.3 kg 110.1 kg    Examination: General: Alert oriented x 3 HENT: JVP to angle of jaw while sitting up Lungs: Bilateral expiratory wheezing Cardiovascular: Regular heart sounds, bradycardic Abdomen: Distended Extremities: Plus edema, warm to touch Neuro: Grossly nonfocal   Resolved Hospital Problem list   -  Assessment & Plan:  Acute acute on chronic systolic and diastolic heart failure Hypotension concern for cardiogenic shock Atrial flutter rate controlled on Amio AKI on CKD COPD Hypertension Hyperlipidemia Diabetes mellitus  appreicate cardiology consult given concern for inadequate diuresis, borderline blood pressure: started on milrinone at 0.25 Continue diuretics, will give additional dose of Lasix as long as Greater than 65. Monitor output, if becomes hypotensive, will start levophed Obtain stat lactate, repeat BMP, troponin, ionized calcium Holding goal-directed medical therapy in the setting of borderline blood pressure Continue Amio gtt. Continue Plavix Continue Eliquis Echo has been ordered Reduce frequency of pain medications given borderline blood pressure, make as needed instead of standing Strict ins and outs Urine lites     Best Practice (right click and "Reselect all SmartList Selections" daily)   Diet/type: Regular consistency (see orders) DVT prophylaxis DOAC Pressure ulcer(s): N/A GI prophylaxis: N/A Lines: N/A Foley:  N/A Code Status:  full code Last date of multidisciplinary goals of care discussion [-]  Labs   CBC: Recent Labs  Lab 03/01/24 0544 03/01/24 0637 03/01/24 0649  WBC 8.2  --   --  HGB 16.1 16.3 17.0  HCT 49.3 48.0 50.0  MCV 101.0*  --   --   PLT 171  --   --     Basic Metabolic Panel: Recent Labs  Lab 03/01/24 0544 03/01/24 0630 03/01/24 0637 03/01/24 0649  NA 134*  --  134* 134*  K 4.7  --  4.5 4.5  CL 100  --   --  101  CO2 26  --   --    --   GLUCOSE 189*  --   --  177*  BUN 19  --   --  25*  CREATININE 1.53*  --   --  1.50*  CALCIUM 8.8*  --   --   --   MG  --  2.2  --   --    GFR: Estimated Creatinine Clearance: 66.5 mL/min (A) (by C-G formula based on SCr of 1.5 mg/dL (H)). Recent Labs  Lab 03/01/24 0544 03/01/24 0649 03/01/24 0947  WBC 8.2  --   --   LATICACIDVEN  --  1.0 1.0    Liver Function Tests: No results for input(s): "AST", "ALT", "ALKPHOS", "BILITOT", "PROT", "ALBUMIN" in the last 168 hours. No results for input(s): "LIPASE", "AMYLASE" in the last 168 hours. No results for input(s): "AMMONIA" in the last 168 hours.  ABG    Component Value Date/Time   PHART 7.373 05/05/2020 2144   PCO2ART 40.7 05/05/2020 2144   PO2ART 69 (L) 05/05/2020 2144   HCO3 24.3 03/01/2024 0637   TCO2 23 03/01/2024 0649   ACIDBASEDEF 2.0 05/05/2020 2144   O2SAT 81 03/01/2024 0637     Coagulation Profile: Recent Labs  Lab 03/01/24 0630  INR 1.5*    Cardiac Enzymes: No results for input(s): "CKTOTAL", "CKMB", "CKMBINDEX", "TROPONINI" in the last 168 hours.  HbA1C: Hgb A1c MFr Bld  Date/Time Value Ref Range Status  03/01/2024 10:37 AM 8.9 (H) 4.8 - 5.6 % Final    Comment:    (NOTE) Pre diabetes:          5.7%-6.4%  Diabetes:              >6.4%  Glycemic control for   <7.0% adults with diabetes   01/22/2023 01:23 PM 7.9 (H) 4.8 - 5.6 % Final    Comment:    (NOTE) Pre diabetes:          5.7%-6.4%  Diabetes:              >6.4%  Glycemic control for   <7.0% adults with diabetes     CBG: Recent Labs  Lab 03/01/24 1154 03/01/24 1700 03/01/24 2055  GLUCAP 344* 154* 144*    Review of Systems:   Positive for orthopnea, lower extremity edema, weight gain  Past Medical History:  He,  has a past medical history of Acute myocardial infarction of other anterior wall, subsequent episode of care (12/12/2011), Anal fissure, Arthritis, Atherosclerosis of native artery of both lower extremities with  intermittent claudication (HCC) (04/01/2018), Bruises easily, CAD (coronary artery disease), Cardiomyopathy secondary (12/26/2011), Chronic systolic CHF (congestive heart failure) (HCC), Chronic total occlusion of artery of the extremities (HCC) (02/26/2012), Claudication (HCC) (12/26/2011), Coronary atherosclerosis of native coronary artery, Diabetes mellitus without complication (HCC), Essential hypertension (10/23/2016), GERD (gastroesophageal reflux disease), History of blood transfusion, History of kidney stones, Hypertension, Ischemic cardiomyopathy, MI (myocardial infarction) (HCC), Mixed hyperlipidemia, Numbness and tingling of right arm (03/31/2013), Occlusion and stenosis of carotid artery without mention of cerebral infarction (08/26/2012), PAD (peripheral artery  disease) [facility]), Peripheral vascular disease, unspecified (HCC) (12/16/2012), PVD (peripheral vascular disease) (HCC) (03/31/2013), Rectal polyp, Subclavian steal syndrome (08/11/2013), Uncontrolled diabetes mellitus (10/23/2016), and Wears partial dentures.   Surgical History:   Past Surgical History:  Procedure Laterality Date   ABDOMINAL AORTOGRAM W/LOWER EXTREMITY N/A 06/26/2017   Procedure: Abdominal Aortogram w/Lower Extremity;  Surgeon: Nada Libman, MD;  Location: [facility];  Service: Cardiovascular;  Laterality: N/A;   ABDOMINAL AORTOGRAM W/LOWER EXTREMITY N/A 06/28/2021   Procedure: ABDOMINAL AORTOGRAM W/LOWER EXTREMITY;  Surgeon: Nada Libman, MD;  Location: [facility];  Service: Cardiovascular;  Laterality: N/A;   ABDOMINAL AORTOGRAM W/LOWER EXTREMITY N/A 01/02/2023   Procedure: ABDOMINAL AORTOGRAM W/LOWER EXTREMITY;  Surgeon: Nada Libman, MD;  Location: [facility];  Service: Cardiovascular;  Laterality: N/A;   ANAL FISSURECTOMY     AORTA - BILATERAL FEMORAL ARTERY BYPASS GRAFT N/A 01/16/2013   Procedure: AORTA BIFEMORAL BYPASS GRAFT;  Surgeon: Nada Libman, MD;  Location: [facility] OR;  Service:  Vascular;  Laterality: N/A;   APPENDECTOMY     CARDIAC CATHETERIZATION N/A 10/23/2016   Procedure: Left Heart Cath and Coronary Angiography;  Surgeon: Kathleene Hazel, MD;  Location: [facility] INVASIVE CV LAB;  Service: Cardiovascular;  Laterality: N/A;   CARDIAC CATHETERIZATION N/A 10/23/2016   Procedure: Coronary Balloon Angioplasty;  Surgeon: Kathleene Hazel, MD;  Location: [facility] INVASIVE CV LAB;  Service: Cardiovascular;  Laterality: N/A;   CAROTID-SUBCLAVIAN BYPASS GRAFT Left 04/03/2013   Procedure: BYPASS GRAFT CAROTID-SUBCLAVIAN;  Surgeon: Nada Libman, MD;  Location: [facility] OR;  Service: Vascular;  Laterality: Left;   CIRCUMCISION N/A 12/15/2015   Procedure: CIRCUMCISION ADULT;  Surgeon: Malen Gauze, MD;  Location: [facility] ORS;  Service: Urology;  Laterality: N/A;   COLONOSCOPY     CORONARY ANGIOPLASTY     stent placed Dec 12, 2011 and 2018   CORONARY ARTERY BYPASS GRAFT N/A 05/05/2020   Procedure: CORONARY ARTERY BYPASS GRAFTING (CABG) x Three, Using Left internal Mammary Artery and Left Leg greater saphenous vein harvested endoscopically;  Surgeon: Delight Ovens, MD;  Location: [facility] OR;  Service: Open Heart Surgery;  Laterality: N/A;   CORONARY BALLOON ANGIOPLASTY N/A 05/23/2019   Procedure: CORONARY BALLOON ANGIOPLASTY;  Surgeon: Kathleene Hazel, MD;  Location: [facility];  Service: Cardiovascular;  Laterality: N/A;   CORONARY PRESSURE/FFR STUDY N/A 04/22/2020   Procedure: INTRAVASCULAR PRESSURE WIRE/FFR STUDY;  Surgeon: Kathleene Hazel, MD;  Location: [facility];  Service: Cardiovascular;  Laterality: N/A;   DIAGNOSTIC LAPAROSCOPY     ENDARTERECTOMY FEMORAL Right 03/13/2018   Procedure: FEMORAL ENDARTERECTOMY RIGHT;  Surgeon: Nada Libman, MD;  Location: [facility] OR;  Service: Vascular;  Laterality: Right;   ENDARTERECTOMY FEMORAL Left 01/26/2023   Procedure: REDO LEFT COMMON FEMORAL ENDARTERECTOMY WITH VPA;  Surgeon: Nada Libman, MD;  Location: [facility]  OR;  Service: Vascular;  Laterality: Left;   FEMORAL-POPLITEAL BYPASS GRAFT Right 03/13/2018   Procedure: FEMORAL-POPLITEAL ARTERY BYPASS WITH NON-REVERSE VEIN RIGHT;  Surgeon: Nada Libman, MD;  Location: [facility] OR;  Service: Vascular;  Laterality: Right;   GROIN DISSECTION Right 03/13/2018   Procedure: RE-DO COMMON FEMORAL ARTERY EXPOSURE;  Surgeon: Nada Libman, MD;  Location: [facility] OR;  Service: Vascular;  Laterality: Right;   I & D EXTREMITY Left 04/18/2013   Procedure: debridement of left neck lymphocele;  Surgeon: Nada Libman, MD;  Location: [facility] OR;  Service: Vascular;  Laterality: Left;  I  and D of left neck   I & D EXTREMITY Right 11/21/2014   Procedure: IRRIGATION AND DEBRIDEMENT RIGHT HAND;  Surgeon: Dominica Severin, MD;  Location: [facility] OR;  Service: Orthopedics;  Laterality: Right;   LEFT HEART CATH AND CORONARY ANGIOGRAPHY N/A 05/23/2019   Procedure: LEFT HEART CATH AND CORONARY ANGIOGRAPHY;  Surgeon: Kathleene Hazel, MD;  Location: [facility];  Service: Cardiovascular;  Laterality: N/A;   LEFT HEART CATH AND CORONARY ANGIOGRAPHY N/A 04/22/2020   Procedure: LEFT HEART CATH AND CORONARY ANGIOGRAPHY;  Surgeon: Kathleene Hazel, MD;  Location: [facility];  Service: Cardiovascular;  Laterality: N/A;   LEFT HEART CATHETERIZATION WITH CORONARY ANGIOGRAM N/A 12/12/2011   Procedure: LEFT HEART CATHETERIZATION WITH CORONARY ANGIOGRAM;  Surgeon: Kathleene Hazel, MD;  Location: [facility] CATH LAB;  Service: Cardiovascular;  Laterality: N/A;   LOWER EXTREMITY ANGIOGRAM N/A 01/31/2012   Procedure: LOWER EXTREMITY ANGIOGRAM;  Surgeon: Kathleene Hazel, MD;  Location: [facility] CATH LAB;  Service: Cardiovascular;  Laterality: N/A;   LOWER EXTREMITY ANGIOGRAPHY N/A 04/23/2018   Procedure: LOWER EXTREMITY ANGIOGRAPHY;  Surgeon: Nada Libman, MD;  [facility]: [facility];  Service: Cardiovascular;  Laterality: N/A;   PATCH ANGIOPLASTY Left 01/26/2023   Procedure: PATCH  ANGIOPLASTY USING HEMASHIELD PLATINUM FINESSE;  Surgeon: Nada Libman, MD;  Location: [facility] OR;  Service: Vascular;  Laterality: Left;   PERCUTANEOUS CORONARY STENT INTERVENTION (PCI-S) Right 12/12/2011   Procedure: PERCUTANEOUS CORONARY STENT INTERVENTION (PCI-S);  Surgeon: Kathleene Hazel, MD;  Location: [facility] CATH LAB;  Service: Cardiovascular;  Laterality: Right;   PERIPHERAL VASCULAR BALLOON ANGIOPLASTY Right 06/28/2021   Procedure: PERIPHERAL VASCULAR BALLOON ANGIOPLASTY;  Surgeon: Nada Libman, MD;  Location: [facility];  Service: Cardiovascular;  Laterality: Right;  common femoral (graft)   PERIPHERAL VASCULAR BALLOON ANGIOPLASTY  01/02/2023   Procedure: PERIPHERAL VASCULAR BALLOON ANGIOPLASTY;  Surgeon: Nada Libman, MD;  Location: [facility];  Service: Cardiovascular;;   PERIPHERAL VASCULAR INTERVENTION Left 04/23/2018   Procedure: PERIPHERAL VASCULAR INTERVENTION;  Surgeon: Nada Libman, MD;  Location: [facility];  Service: Cardiovascular;  Laterality: Left;  superficial femoral   REPAIR EXTENSOR TENDON Right 11/21/2014   Procedure: WITH REPAIR/RECONSTRUCTION OF EXTENSOR TENDONS AS NEEDED;  Surgeon: Dominica Severin, MD;  Location: [facility] OR;  Service: Orthopedics;  Laterality: Right;   TEE WITHOUT CARDIOVERSION N/A 05/05/2020   Procedure: TRANSESOPHAGEAL ECHOCARDIOGRAM (TEE);  Surgeon: Delight Ovens, MD;  Location: [facility] OR;  Service: Open Heart Surgery;  Laterality: N/A;     Social History:   reports that he has been smoking cigarettes. He has a 6.3 pack-year smoking history. He has never used smokeless tobacco. He reports current alcohol use of about 2.0 standard drinks of alcohol per week. He reports that he does not use drugs.   Family History:  His family history includes COPD in his mother; Cancer in his father and maternal uncle; Diabetes in his mother.   Allergies Allergies  Allergen Reactions   Metformin Anaphylaxis    Water blisters between  fingers   Hydrocodone Hives and Nausea And Vomiting     Home Medications  Prior to Admission medications   Medication Sig Start Date End Date Taking? Authorizing Provider  albuterol (VENTOLIN HFA) 108 (90 Base) MCG/ACT inhaler Inhale 1-2 puffs into the lungs every 6 (six) hours as needed for wheezing or shortness of breath.   Yes [provider]  aspirin EC 81 MG tablet Take  1 tablet (81 mg total) by mouth daily. 10/23/16  Yes Dunn, Dayna N, PA-C  atorvastatin (LIPITOR) 80 MG tablet Take 80 mg by mouth daily at 6 PM. 12/29/22  Yes [provider]  carvedilol (COREG) 6.25 MG tablet Take 1.5 tablets (9.375 mg total) by mouth 2 (two) times daily. 06/22/23  Yes Weaver, Scott T, PA-C  cetirizine (ZYRTEC) 10 MG tablet Take 10 mg by mouth daily as needed for allergies. 06/02/21  Yes [provider]  cyclobenzaprine (FLEXERIL) 10 MG tablet Take 1 tablet (10 mg total) by mouth 2 (two) times daily as needed for muscle spasms. 05/21/19  Yes Simmons, Brittainy M, PA-C  diazepam (VALIUM) 10 MG tablet Take 10 mg by mouth at bedtime. 03/10/21  Yes [provider]  ELIQUIS 5 MG TABS tablet TAKE 1 TABLET BY MOUTH 2 TIMES A DAY 10/03/23  Yes Kathleene Hazel, MD  Evolocumab (REPATHA SURECLICK) 140 MG/ML SOAJ Inject 140 mg into the skin every 14 (fourteen) days. 06/22/23  Yes Weaver, Scott T, PA-C  ezetimibe (ZETIA) 10 MG tablet Take 1 tablet (10 mg total) by mouth daily. 03/26/23 03/24/24 Yes Weaver, Scott T, PA-C  FARXIGA 10 MG TABS tablet Take 10 mg by mouth daily. 04/05/21  Yes [provider]  furosemide (LASIX) 40 MG tablet Take 40 mg by mouth daily.  Can take extra tablet for wt gain of 3 lbs in 24 hour or 5 lbs in one week. 03/15/23 03/01/24 Yes [provider]  insulin glargine (LANTUS) 100 UNIT/ML Solostar Pen Inject 50 Units into the skin 2 (two) times daily.   Yes [provider]  insulin lispro (HUMALOG) 100 UNIT/ML injection Inject 5-15 Units  into the skin 3 (three) times daily as needed (blood sugar over 150).   Yes [provider]  naloxone (NARCAN) nasal spray 4 mg/0.1 mL Place 4 mg into the nose once as needed (overdose). 03/31/21  Yes [provider]  nitroGLYCERIN (NITROSTAT) 0.4 MG SL tablet Place 0.4 mg under the tongue every 5 (five) minutes as needed for chest pain.   Yes [provider]  oxyCODONE-acetaminophen (PERCOCET) 10-325 MG tablet Take 1 tablet by mouth 4 (four) times daily.   Yes [provider]  pantoprazole (PROTONIX) 40 MG tablet Take 40 mg by mouth daily.   Yes [provider]  sacubitril-valsartan (ENTRESTO) 24-26 MG Take 1 tablet by mouth 2 (two) times daily. 07/05/23  Yes Weaver, Scott T, PA-C  spironolactone (ALDACTONE) 25 MG tablet Take 25 mg by mouth daily. 12/29/22  Yes [provider]  TRINTELLIX 20 MG TABS tablet Take 20 mg by mouth daily in the afternoon. 12/29/22  Yes [provider]  VASCEPA 1 g capsule Take 2 capsules (2 g total) by mouth 2 (two) times daily. 06/22/23  Yes Tereso Newcomer T, PA-C  clopidogrel (PLAVIX) 75 MG tablet Take 1 tablet (75 mg total) by mouth daily. Patient not taking: Reported on 03/01/2024 03/26/23 03/25/24  Tereso Newcomer T, PA-C  gabapentin (NEURONTIN) 300 MG capsule Take 300 mg by mouth 3 (three) times daily. Patient not taking: Reported on 03/01/2024 03/25/20   [provider]     Critical care time: 45 minutes

## 2024-03-01 NOTE — H&P (Addendum)
 History and Physical    Patient: Ryan [name] ZOX:[id number] DOB: Jun 25, 1966 DOA: 03/01/2024 DOS: the patient was seen and examined on 03/01/2024 PCP: Fleet Contras, MD  Patient coming from: Home  Chief Complaint:  Chief Complaint  Patient presents with   Chest Pain   HPI: Ryan [name] is a 58 y.o. male with medical history significant of coronary artery disease (CABG), heart failure (reduced EF with left apical LV thrombus), T2DM, COPD, hypertension, dyslipidemia, GERD, and obesity who presented with dyspnea.  Reported 10 days of worsening dyspnea on exertion, to the point of having symptoms with minimal efforts. Positive PND and orthopnea, along with lower extremity edema.  Edema has expanded into his abdomen.  He has noticed recent weight gain but not able to specify quantity.  No palpitations or chest pain, he has been compliant with his medications including diuretics.   No recent change in his medical therapy.    Because worsening symptoms he presented to the ED for further evaluation.   Review of Systems: As mentioned in the history of present illness. All other systems reviewed and are negative. Past Medical History:  Diagnosis Date   Acute myocardial infarction of other anterior wall, subsequent episode of care 12/12/2011   Anal fissure    Arthritis    back   Atherosclerosis of native artery of both lower extremities with intermittent claudication (HCC) 04/01/2018   Bruises easily    d/t being on Effient   CAD (coronary artery disease)    A. Acute Ant STEMI 12/12/2011    Cardiomyopathy secondary 12/26/2011   Chronic systolic CHF (congestive heart failure) (HCC)    ischemic CM // Echo 6/21: EF 30-35  //  Echocardiogram 10/21: LV apical thrombus resolved, EF 50-55, ant and apical, ant-lat HK, trivial MR, trivial AI   Chronic total occlusion of artery of the extremities (HCC) 02/26/2012   Claudication (HCC) 12/26/2011   Coronary atherosclerosis of native coronary artery    a.  ant STEMI with cardiac arrest 2013 s/p DES to mLAD. b. [place] 10/2016 s/p PTCA to mLAD.   Diabetes mellitus without complication (HCC)    Essential hypertension 10/23/2016   GERD (gastroesophageal reflux disease)    "takes tums"   History of blood transfusion    no abnormal reaction noted   History of kidney stones    Hypertension    Ischemic cardiomyopathy    a. EF 35% in 02/2012 at time of acute MI, improved to normal on subsequent imaging.   MI (myocardial infarction) (HCC)    AMI 1/13 - complicated by VT/Tosades   Mixed hyperlipidemia    Numbness and tingling of right arm 03/31/2013   Occlusion and stenosis of carotid artery without mention of cerebral infarction 08/26/2012   PAD (peripheral artery disease) (HCC)    followed by Vascular - aortobifem bypass 01/2013 & left carotid-subclavian artery bypass 04/2013   Peripheral vascular disease, unspecified (HCC) 12/16/2012   PVD (peripheral vascular disease) (HCC) 03/31/2013   Rectal polyp    Subclavian steal syndrome 08/11/2013   Uncontrolled diabetes mellitus 10/23/2016   Wears partial dentures    upper   Past Surgical History:  Procedure Laterality Date   ABDOMINAL AORTOGRAM W/LOWER EXTREMITY N/A 06/26/2017   Procedure: Abdominal Aortogram w/Lower Extremity;  Surgeon: Nada Libman, MD;  Location: [facility] INVASIVE CV LAB;  Service: Cardiovascular;  Laterality: N/A;   ABDOMINAL AORTOGRAM W/LOWER EXTREMITY N/A 06/28/2021   Procedure: ABDOMINAL AORTOGRAM W/LOWER EXTREMITY;  Surgeon: Nada Libman, MD;  Location: [facility] INVASIVE  CV LAB;  Service: Cardiovascular;  Laterality: N/A;   ABDOMINAL AORTOGRAM W/LOWER EXTREMITY N/A 01/02/2023   Procedure: ABDOMINAL AORTOGRAM W/LOWER EXTREMITY;  Surgeon: Nada Libman, MD;  Location: [facility] IN[facility]AS[facility] CV [facility];  Service: Cardiovascular;  Laterality: N/A;   ANAL FISSURECTOMY     AORTA - BILATERAL FEMORAL ARTERY BYPASS GRAFT N/A 01/16/2013   Procedure: AORTA BIFEMORAL BYPASS GRAFT;  Surgeon: Nada Libman, MD;   Location: [facility] OR;  Service: Vascular;  Laterality: N/A;   APPENDECTOMY     CARDIAC CATHETERIZATION N/A 10/23/2016   Procedure: Left Heart Cath and Coronary Angiography;  Surgeon: Kathleene Hazel, MD;  Location: [facility] INVASIVE CV LAB;  Service: Cardiovascular;  Laterality: N/A;   CARDIAC CATHETERIZATION N/A 10/23/2016   Procedure: Coronary Balloon Angioplasty;  Surgeon: Kathleene Hazel, MD;  Location: [facility] INVASIVE CV LAB;  Service: Cardiovascular;  Laterality: N/A;   CAROTID-SUBCLAVIAN BYPASS GRAFT Left 04/03/2013   Procedure: BYPASS GRAFT CAROTID-SUBCLAVIAN;  Surgeon: Nada Libman, MD;  Location: [facility] OR;  Service: Vascular;  Laterality: Left;   CIRCUMCISION N/A 12/15/2015   Procedure: CIRCUMCISION ADULT;  Surgeon: Malen Gauze, MD;  Location: [facility] ORS;  Service: Urology;  Laterality: N/A;   COLONOSCOPY     CORONARY ANGIOPLASTY     stent placed Dec 12, 2011 and 2018   CORONARY ARTERY BYPASS GRAFT N/A 05/05/2020   Procedure: CORONARY ARTERY BYPASS GRAFTING (CABG) x Three, Using Left internal Mammary Artery and Left Leg greater saphenous vein harvested endoscopically;  Surgeon: Delight Ovens, MD;  Location: [facility] OR;  Service: Open Heart Surgery;  Laterality: N/A;   CORONARY BALLOON ANGIOPLASTY N/A 05/23/2019   Procedure: CORONARY BALLOON ANGIOPLASTY;  Surgeon: Kathleene Hazel, MD;  [facility]: [facility] INV[facility] CV LAB;  Service: Cardiovascular;  Laterality: N/A;   CORONARY PRESSURE/FFR STUDY N/A 04/22/2020   Procedure: INTRAVASCULAR PRESSURE WIRE/FFR STUDY;  Surgeon: Kathleene Hazel, MD;  [facility]: [facility];  Service: Cardiovascular;  Laterality: N/A;   DIAGNOSTIC LAPAROSCOPY     ENDARTERECTOMY FEMORAL Right 03/13/2018   Procedure: FEMORAL ENDARTERECTOMY RIGHT;  Surgeon: Nada Libman, MD;  Location: [facility] OR;  Service: Vascular;  Laterality: Right;   ENDARTERECTOMY FEMORAL Left 01/26/2023   Procedure: REDO LEFT COMMON FEMORAL ENDARTERECTOMY WITH VPA;  Surgeon: Nada Libman, MD;  Location: [facility] OR;  Service: Vascular;  Laterality: Left;   FEMORAL-POPLITEAL BYPASS GRAFT Right 03/13/2018   Procedure: FEMORAL-POPLITEAL ARTERY BYPASS WITH NON-REVERSE VEIN RIGHT;  Surgeon: Nada Libman, MD;  Location: [facility] OR;  Service: Vascular;  Laterality: Right;   GROIN DISSECTION Right 03/13/2018   Procedure: RE-DO COMMON FEMORAL ARTERY EXPOSURE;  Surgeon: Nada Libman, MD;  Location: [facility] OR;  Service: Vascular;  Laterality: Right;   I & D EXTREMITY Left 04/18/2013   Procedure: debridement of left neck lymphocele;  Surgeon: Nada Libman, MD;  Location: [facility] OR;  Service: Vascular;  Laterality: Left;  I and D of left neck   I & D EXTREMITY Right 11/21/2014   Procedure: IRRIGATION AND DEBRIDEMENT RIGHT HAND;  Surgeon: Dominica Severin, MD;  Location: [facility] OR;  Service: Orthopedics;  Laterality: Right;   LEFT HEART CATH AND CORONARY ANGIOGRAPHY N/A 05/23/2019   Procedure: LEFT HEART CATH AND CORONARY ANGIOGRAPHY;  Surgeon: Kathleene Hazel, MD;  [facility]: [facility];  Service: Cardiovascular;  Laterality: N/A;   LEFT HEART CATH AND CORONARY ANGIOGRAPHY N/A 04/22/2020   Procedure: LEFT HEART CATH AND CORONARY ANGIOGRAPHY;  Surgeon: Kathleene Hazel, MD;  [facility]: [facility];  Service: Cardiovascular;  Laterality: N/A;   LEFT HEART CATHETERIZATION WITH CORONARY ANGIOGRAM N/A 12/12/2011   Procedure: LEFT HEART CATHETERIZATION WITH CORONARY ANGIOGRAM;  Surgeon: Kathleene Hazel, MD;  Location: [facility] CATH LAB;  Service: Cardiovascular;  Laterality: N/A;   LOWER EXTREMITY ANGIOGRAM N/A 01/31/2012   Procedure: LOWER EXTREMITY ANGIOGRAM;  Surgeon: Kathleene Hazel, MD;  Location: [facility] CATH LAB;  Service: Cardiovascular;  Laterality: N/A;   LOWER EXTREMITY ANGIOGRAPHY N/A 04/23/2018   Procedure: LOWER EXTREMITY ANGIOGRAPHY;  Surgeon: Nada Libman, MD;  [facility]: [facility];  Service: Cardiovascular;  Laterality: N/A;   PATCH ANGIOPLASTY Left  01/26/2023   Procedure: PATCH ANGIOPLASTY USING HEMASHIELD PLATINUM FINESSE;  Surgeon: Nada Libman, MD;  Location: [facility] OR;  Service: Vascular;  Laterality: Left;   PERCUTANEOUS CORONARY STENT INTERVENTION (PCI-S) Right 12/12/2011   Procedure: PERCUTANEOUS CORONARY STENT INTERVENTION (PCI-S);  Surgeon: Kathleene Hazel, MD;  Location: [facility] CATH LAB;  Service: Cardiovascular;  Laterality: Right;   PERIPHERAL VASCULAR BALLOON ANGIOPLASTY Right 06/28/2021   Procedure: PERIPHERAL VASCULAR BALLOON ANGIOPLASTY;  Surgeon: Nada Libman, MD;  Location: [facility];  Service: Cardiovascular;  Laterality: Right;  common femoral (graft)   PERIPHERAL VASCULAR BALLOON ANGIOPLASTY  01/02/2023   Procedure: PERIPHERAL VASCULAR BALLOON ANGIOPLASTY;  Surgeon: Nada Libman, MD;  Location: [facility];  Service: Cardiovascular;;   PERIPHERAL VASCULAR INTERVENTION Left 04/23/2018   Procedure: PERIPHERAL VASCULAR INTERVENTION;  Surgeon: Nada Libman, MD;  Location: [facility];  Service: Cardiovascular;  Laterality: Left;  superficial femoral   REPAIR EXTENSOR TENDON Right 11/21/2014   Procedure: WITH REPAIR/RECONSTRUCTION OF EXTENSOR TENDONS AS NEEDED;  Surgeon: Dominica Severin, MD;  Location: [facility] OR;  Service: Orthopedics;  Laterality: Right;   TEE WITHOUT CARDIOVERSION N/A 05/05/2020   Procedure: TRANSESOPHAGEAL ECHOCARDIOGRAM (TEE);  Surgeon: Delight Ovens, MD;  Location: [facility] OR;  Service: Open Heart Surgery;  Laterality: N/A;   Social History:  reports that he has been smoking cigarettes. He has a 6.3 pack-year smoking history. He has never used smokeless tobacco. He reports current alcohol use of about 2.0 standard drinks of alcohol per week. He reports that he does not use drugs.  Allergies  Allergen Reactions   Metformin Anaphylaxis    Water blisters between fingers   Hydrocodone Hives and Nausea And Vomiting    Family History  Problem Relation Age of Onset   Cancer  Father        Lung   Diabetes Mother    COPD Mother    Cancer Maternal Uncle        Colon    Prior to Admission medications   Medication Sig Start Date End Date Taking? Authorizing Provider  albuterol (VENTOLIN HFA) 108 (90 Base) MCG/ACT inhaler Inhale 1-2 puffs into the lungs every 6 (six) hours as needed for wheezing or shortness of breath.    [provider]  aspirin EC 81 MG tablet Take 1 tablet (81 mg total) by mouth daily. 10/23/16   Dunn, Tacey Ruiz, PA-C  atorvastatin (LIPITOR) 80 MG tablet Take 80 mg by mouth daily at 6 PM. 12/29/22   [provider]  carvedilol (COREG) 6.25 MG tablet Take 1.5 tablets (9.375 mg total) by mouth 2 (two) times daily. 06/22/23   Tereso Newcomer T, PA-C  cetirizine (ZYRTEC) 10 MG tablet Take 10 mg by mouth daily as needed for allergies. 06/02/21   [provider]  clopidogrel (PLAVIX)  75 MG tablet Take 1 tablet (75 mg total) by mouth daily. 03/26/23 03/25/24  Tereso Newcomer T, PA-C  cyclobenzaprine (FLEXERIL) 10 MG tablet Take 1 tablet (10 mg total) by mouth 2 (two) times daily as needed for muscle spasms. 05/21/19   Robbie Lis M, PA-C  diazepam (VALIUM) 10 MG tablet Take 10 mg by mouth at bedtime. 03/10/21   [provider]  ELIQUIS 5 MG TABS tablet TAKE 1 TABLET BY MOUTH 2 TIMES A DAY 10/03/23   Kathleene Hazel, MD  Evolocumab (REPATHA SURECLICK) 140 MG/ML SOAJ Inject 140 mg into the skin every 14 (fourteen) days. 06/22/23   Tereso Newcomer T, PA-C  ezetimibe (ZETIA) 10 MG tablet Take 1 tablet (10 mg total) by mouth daily. 03/26/23 03/24/24  Tereso Newcomer T, PA-C  FARXIGA 10 MG TABS tablet Take 10 mg by mouth daily. 04/05/21   [provider]  furosemide (LASIX) 40 MG tablet Take 40 mg by mouth daily.  Can take extra tablet for wt gain of 3 lbs in 24 hour or 5 lbs in one week. 03/15/23 06/13/23  [provider]  gabapentin (NEURONTIN) 300 MG capsule Take 300 mg by mouth 3 (three) times daily. 03/25/20    [provider]  insulin glargine (LANTUS) 100 UNIT/ML Solostar Pen Inject 50 Units into the skin 2 (two) times daily.    [provider]  insulin lispro (HUMALOG) 100 UNIT/ML injection Inject 5-15 Units into the skin 3 (three) times daily as needed (blood sugar over 150).    [provider]  naloxone [facility]) nasal spray 4 mg/0.1 mL Place 4 mg into the nose once as needed (overdose). 03/31/21   [provider]  nitroGLYCERIN (NITROSTAT) 0.4 MG SL tablet Place 0.4 mg under the tongue every 5 (five) minutes as needed for chest pain.    [provider]  oxyCODONE-acetaminophen (PERCOCET) 10-325 MG tablet Take 1 tablet by mouth 4 (four) times daily.    [provider]  pantoprazole (PROTONIX) 40 MG tablet Take 40 mg by mouth daily.    [provider]  sacubitril-valsartan (ENTRESTO) 24-26 MG Take 1 tablet by mouth 2 (two) times daily. 07/05/23   Tereso Newcomer T, PA-C  spironolactone (ALDACTONE) 25 MG tablet Take 25 mg by mouth daily. 12/29/22   [provider]  TRINTELLIX 20 MG TABS tablet Take 20 mg by mouth daily in the afternoon. 12/29/22   [provider]  VASCEPA 1 g capsule Take 2 capsules (2 g total) by mouth 2 (two) times daily. 06/22/23   Tereso Newcomer T, PA-C    Physical Exam: Vitals:   03/01/24 0602 03/01/24 0615 03/01/24 0645 03/01/24 0818  BP: (!) 84/55 (!) 87/74 100/83 96/79  Pulse: 62 (!) 57 (!) 35 (!) 141  Resp: 15 20 18  (!) 27  Temp:    98.4 F (36.9 C)  TempSrc:    Oral  SpO2: 100% 100% 100% 97%  Weight:      Height:       Neurology awake and alert, deconditioned ENT with mild pallor with no icterus Cardiovascular with S1 and S2 tachycardic with no gallops or rubs, no murmurs Mild JVD (wide neck) Positive bilateral pitting lower extremity edema +++., no frank edema at his thighs, and + edema at his abdominal wall.  Respiratory with scattered rales bilaterally with no rhonchi, positive inspiratory  wheezing bilaterally.  Abdomen soft and non tender  Data Reviewed:   Na 134, K 4,7, Cl 100, bicarbonate 26 glucose  189, BUN 19 cr 1,53  BNP 1,074  High sensitive troponin 25  Lactic acid 1,0 and 1,0  Wbc 8,2 hgb 16.1 plt 171  INR 1,5   Urine analysis SG 1,009, protein 100, hgb negative, leukocytes negative, glucose > 500   Chest radiograph with cardiomegaly, bilateral hilar vascular congestion, bilateral central interstitial infiltrates, bilateral small pleural effusions. Sternotomy wires in place.   EKG 142 bpm, right axis deviation, qtc 427, atrial flutter rhythm (2:1 vs 1:1), poor RR wave progression, no ST segment changes, negative T wave lead II, III, aVF, V5 and V6.   Assessment and Plan: * Acute on chronic systolic CHF (congestive heart failure) (HCC) 06/2023 echocardiogram with reduced LV systolic function EF 35 to 40%, RV systolic function preserved, LA with mild dilatation, mild MR, LV wall with mid and distal anterior septum and entire apex akinetic.   Plan continue diuresis with furosemide 60 mg IV bid.  Limited medical therapy due to risk of hypotension.  Continue spironolactone and SGLT 2 inh. Hold on entresto due to risk of hypotension and hold on carvedilol due to low EF.   Continue rate control atrial flutter with amiodarone.  Follow up repeat echocardiogram.    Paroxysmal atrial fibrillation (HCC) Currently atrial flutter rhythm.   Plan to continue rate control with amiodarone and continue telemetry monitoring.  Continue anticoagulation with apixaban.  Cardiology was consulted with possible plans for direct current cardioversion after diuresis.  He may need TEE considering history of LV thrombus.   CAD (coronary artery disease) Mild elevation of high sensitive troponin likely due to heart failure and tachycardia in the setting of coronary artery disease.   Patient with no chest pain.  Acute coronary syndrome ruled out.  Continue dual antiplatelet therapy  with aspirin and clopidogrel.  Continue statin therapy.   Chronic kidney disease, stage 3a (HCC) Continue diuresis with furosemide IV,  Continue with spironolactone and SGLT 2 inh Close follow up renal function and electrolytes, avoid hypotension and nephrotoxic medications.   Essential hypertension Continue blood pressure monitoring, considering low EF will target MAP of 60 or greater.  His lower extremities are warm and lactic acid is 1,0 No clinical signs of cardiogenic shock.   Type 2 diabetes mellitus with hyperlipidemia (HCC) Continue glucose cover and monitoring with insulin sliding scale.  His admission glucose with no severe elevation.  Continue with statin therapy   PAD (peripheral artery disease) (HCC) Continue aspirin and clopidogrel Continue statin therapy.   COPD (chronic obstructive pulmonary disease) (HCC) No signs of acute exacerbation, continue oxymetry monitoring.  As needed bronchodilatory therapy with albuterol.   Obesity, class 1 Calculated BMI is 31.0       Advance Care Planning:   Code Status: Full Code   Consults: Cardiology in the ED   Family Communication: I spoke with patient's daughter at the bedside, we talked in detail about patient's condition, plan of care and prognosis and all questions were addressed.   Severity of Illness: The appropriate patient status for this patient is INPATIENT. Inpatient status is judged to be reasonable and necessary in order to provide the required intensity of service to ensure the patient's safety. The patient's presenting symptoms, physical exam findings, and initial radiographic and laboratory data in the context of their chronic comorbidities is felt to place them at high risk for further clinical deterioration. Furthermore, it is not anticipated that the patient will be medically stable for discharge from the hospital within 2 midnights of admission.   *  I certify that at the point of admission it is my  clinical judgment that the patient will require inpatient hospital care spanning beyond 2 midnights from the point of admission due to high intensity of service, high risk for further deterioration and high frequency of surveillance required.*  Author: Coralie Keens, MD 03/01/2024 8:36 AM  For on call review [email or web address].

## 2024-03-01 NOTE — Progress Notes (Signed)
 Cardiology brief urgent note   Patient seen by our team earlier today as a part of consult  Briefly patient with ischemic cardiomyopathy, most recent EF is 35 to 40% from echo 06/2023, CAD status post CABG 2021, severe PAD, left carotid to subclavian bypass 04/2023, right femoral to popliteal bypass, vasculopath, CKD, diabetes, multiple other comorbidities admitted with shortness of breath, atrial flutter and decompensated heart failure  Cardiology has put him on IV amiodarone for a flutter, ongoing diuresis for decompensated heart failure, BNP 1074 Throughout today he has been very hypotensive and transferred to ICU for further care, critical care is seeing him and consulted cardiology again to see if he benefits from inotropes.  Current vitals heart rate 66 bpm, blood pressure 76/60 mmHg MAP of 65 Urine output today is only recorded as 285 cc Creatinine 1.50, troponins flat, hemoglobin stable, lactic acid checked this morning is 1, repeat lactic acid pending  Upon exam patient is volume overloaded, he is on oxygen 2 L, he does smoke has wheezing on exam, bilateral pitting edema, lukewarm legs not completely warm.  Overall given low urine output, volume overload, limited lukewarm legs all points towards cardiogenic shock   Cardiogenic shock Acute on chronic decompensated heart failure, EF 35 to 40%. Ischemic cardiomyopathy. CAD status post CABG A-flutter with RVR, currently heart rate controlled on IV amiodarone   --> Check lactic acid, start IV milrinone 0.25 mcg/kg/min -Put PICC line tomorrow morning -> Continue IV amiodarone Continue IV diuretics with Lasix, increased back up to 80 mg twice daily given volume overload, -> Hold GDMT.. Monitor urine output accurately. N.p.o. after midnight on Sunday for cardioversion on Monday

## 2024-03-01 NOTE — ED Notes (Signed)
 MD at bedside.

## 2024-03-01 NOTE — Assessment & Plan Note (Signed)
 06/2023 echocardiogram with reduced LV systolic function EF 35 to 40%, RV systolic function preserved, LA with mild dilatation, mild MR, LV wall with mid and distal anterior septum and entire apex akinetic.   Plan continue diuresis with furosemide 60 mg IV bid.  Limited medical therapy due to risk of hypotension.  Continue spironolactone and SGLT 2 inh. Hold on entresto due to risk of hypotension and hold on carvedilol due to low EF.   Continue rate control atrial flutter with amiodarone.  Follow up repeat echocardiogram.

## 2024-03-01 NOTE — Assessment & Plan Note (Signed)
 Continue aspirin and clopidogrel Continue statin therapy.

## 2024-03-01 NOTE — Progress Notes (Addendum)
 Acute on chronic CHF History of CHF with reduced EF 35 to 40%  Patient's blood pressure is soft MAP 70.  Most recent blood pressure 82/63. History of CHF reduced EF 35 to 40% - At home patient is on oral Lasix 40 mg daily.  He has been given IV Lasix 80 mg (equivalent of 160 mg of oral Lasix) at 11 AM 3/28.   Holding IV Lasix for tonight and decreasing decreasing the dose of Lasix to 60 mg to 20 mg twice daily in the setting of soft blood pressure. Goal to keep systolic blood pressure above 90 and MAP above 65.  History of proximal mitral fibrillation Atrial flutter -PACs atrial fibrillation currently atrial flutter on amiodarone drip.  Per chart review patient's MAP is 67 and trending down.   Limited GDMT guided therapy in the setting of hypotension and low EF. Already has been decreasing the dose of IV Lasix 20 mg twice daily however patient need more IV diuretics in the setting of CHF exacerbation also amiodarone drip contributing to decreasing the blood pressure further. Holding [place], [place], Coreg and spironolactone. Patient need aggressive IV diuresis however soft blood pressure limiting the treatment.  At this point patient will benefit to ICU transfer for milrinone or dobutamine support.  -Consulted and spoke with PCCM physician Dr. Reita May will evaluate patient.  Tereasa Coop, MD [facility] 03/01/2024, 9:10 PM

## 2024-03-01 NOTE — Assessment & Plan Note (Signed)
 Continue diuresis with furosemide IV,  Continue with spironolactone and SGLT 2 inh Close follow up renal function and electrolytes, avoid hypotension and nephrotoxic medications.

## 2024-03-01 NOTE — Assessment & Plan Note (Addendum)
 Currently atrial flutter rhythm.   Plan to continue rate control with amiodarone and continue telemetry monitoring.  Continue anticoagulation with apixaban.  Cardiology was consulted with possible plans for direct current cardioversion after diuresis.  He may need TEE considering history of LV thrombus.

## 2024-03-01 NOTE — ED Provider Notes (Signed)
 [facility] AT [facility] Provider Note  CSN: [id number] Arrival date & time: 03/01/24 0534  Chief Complaint(s) Chest Pain  HPI Ryan [name] is a 58 y.o. male with extensive past medical history listed below including hypertension, hyperlipidemia, ischemic cardiomyopathy with prior MI status post stenting and bypass surgery, prior LV thrombus on Eliquis, chronic diastolic heart failure with a last EF of 35%,  diabetes on insulin, peripheral vascular disease here for 1 week of gradually worsening shortness of breath and peripheral edema.  Patient also endorsing intermittent chest pains at rest described as pressure.  These are nonradiating and nonexertional.  They last anywhere from seconds to minutes.  Shortness of breath is exertional and gradually worsened throughout the week.  He is also endorsing worsening peripheral edema.  States that he is compliant with all of his medications including his diuretics and blood thinners. Endorses orthopnea. No fevers or infections.  He is endorsing months of left-sided abdominal discomfort.  No urinary symptoms.  No other physical complaints.  The history is provided by the patient.    Past Medical History Past Medical History:  Diagnosis Date   Acute myocardial infarction of other anterior wall, subsequent episode of care 12/12/2011   Anal fissure    Arthritis    back   Atherosclerosis of native artery of both lower extremities with intermittent claudication (HCC) 04/01/2018   Bruises easily    d/t being on Effient   CAD (coronary artery disease)    A. Acute Ant STEMI 12/12/2011    Cardiomyopathy secondary 12/26/2011   Chronic systolic CHF (congestive heart failure) (HCC)    ischemic CM // Echo 6/21: EF 30-35  //  Echocardiogram 10/21: LV apical thrombus resolved, EF 50-55, ant and apical, ant-lat HK, trivial MR, trivial AI   Chronic total occlusion of artery of the extremities (HCC) 02/26/2012   Claudication (HCC)  12/26/2011   Coronary atherosclerosis of native coronary artery    a. ant STEMI with cardiac arrest 2013 s/p DES to mLAD. b. [place] 10/2016 s/p PTCA to mLAD.   Diabetes mellitus without complication (HCC)    Essential hypertension 10/23/2016   GERD (gastroesophageal reflux disease)    "takes tums"   History of blood transfusion    no abnormal reaction noted   History of kidney stones    Hypertension    Ischemic cardiomyopathy    a. EF 35% in 02/2012 at time of acute MI, improved to normal on subsequent imaging.   MI (myocardial infarction) (HCC)    AMI 1/13 - complicated by VT/Tosades   Mixed hyperlipidemia    Numbness and tingling of right arm 03/31/2013   Occlusion and stenosis of carotid artery without mention of cerebral infarction 08/26/2012   PAD (peripheral artery disease) (HCC)    followed by Vascular - aortobifem bypass 01/2013 & left carotid-subclavian artery bypass 04/2013   Peripheral vascular disease, unspecified (HCC) 12/16/2012   PVD (peripheral vascular disease) (HCC) 03/31/2013   Rectal polyp    Subclavian steal syndrome 08/11/2013   Uncontrolled diabetes mellitus 10/23/2016   Wears partial dentures    upper   Patient Active Problem List   Diagnosis Date Noted   NSTEMI (non-ST elevation myocardial infarction) (HCC) 03/26/2023   HFrEF (heart failure with reduced ejection fraction) (HCC) 03/26/2023   Left ventricular apical thrombus 03/26/2023   Pure hypercholesterolemia 03/26/2023   Claudication in peripheral vascular disease (HCC) 01/26/2023   Paroxysmal atrial fibrillation (HCC) 03/31/2022   Acute combined systolic and diastolic heart  failure (HCC) 03/29/2022   S/P CABG x 3 05/05/2020   Unstable angina (HCC)    Atherosclerosis of native artery of both lower extremities with intermittent claudication (HCC) 04/01/2018   COPD (chronic obstructive pulmonary disease) (HCC) 07/31/2017   Hyponatremia 07/31/2017   Non-compliance 07/31/2017   ST elevation myocardial infarction  involving left anterior descending (LAD) coronary artery (HCC) 07/31/2017   Transaminitis 07/31/2017   Uncontrolled diabetes mellitus 10/23/2016   Essential hypertension 10/23/2016   Subclavian steal syndrome 08/11/2013   PVD (peripheral vascular disease) (HCC) 03/31/2013   Numbness and tingling of right arm 03/31/2013   Pain in joint, upper arm 03/31/2013   Peripheral vascular disease, unspecified (HCC) 12/16/2012   Occlusion and stenosis of carotid artery without mention of cerebral infarction 08/26/2012   Chronic total occlusion of artery of the extremities (HCC) 02/26/2012   PAD (peripheral artery disease) (HCC) 01/24/2012   Ischemic cardiomyopathy 01/24/2012   Cardiomyopathy, secondary (HCC) 12/26/2011   Claudication (HCC) 12/26/2011   Mixed hyperlipidemia 12/14/2011   Acute myocardial infarction of other anterior wall, subsequent episode of care 12/12/2011   Tobacco abuse    CAD (coronary artery disease)    Home Medication(s) Prior to Admission medications   Medication Sig Start Date End Date Taking? Authorizing Provider  albuterol (VENTOLIN HFA) 108 (90 Base) MCG/ACT inhaler Inhale 1-2 puffs into the lungs every 6 (six) hours as needed for wheezing or shortness of breath.    [provider]  aspirin EC 81 MG tablet Take 1 tablet (81 mg total) by mouth daily. 10/23/16   Dunn, Tacey Ruiz, PA-C  atorvastatin (LIPITOR) 80 MG tablet Take 80 mg by mouth daily at 6 PM. 12/29/22   [provider]  carvedilol (COREG) 6.25 MG tablet Take 1.5 tablets (9.375 mg total) by mouth 2 (two) times daily. 06/22/23   Tereso Newcomer T, PA-C  cetirizine (ZYRTEC) 10 MG tablet Take 10 mg by mouth daily as needed for allergies. 06/02/21   [provider]  clopidogrel (PLAVIX) 75 MG tablet Take 1 tablet (75 mg total) by mouth daily. 03/26/23 03/25/24  Tereso Newcomer T, PA-C  cyclobenzaprine (FLEXERIL) 10 MG tablet Take 1 tablet (10 mg total) by mouth 2 (two) times daily as needed for muscle  spasms. 05/21/19   Robbie Lis M, PA-C  diazepam (VALIUM) 10 MG tablet Take 10 mg by mouth at bedtime. 03/10/21   [provider]  ELIQUIS 5 MG TABS tablet TAKE 1 TABLET BY MOUTH 2 TIMES A DAY 10/03/23   Kathleene Hazel, MD  Evolocumab (REPATHA SURECLICK) 140 MG/ML SOAJ Inject 140 mg into the skin every 14 (fourteen) days. 06/22/23   Tereso Newcomer T, PA-C  ezetimibe (ZETIA) 10 MG tablet Take 1 tablet (10 mg total) by mouth daily. 03/26/23 03/24/24  Tereso Newcomer T, PA-C  FARXIGA 10 MG TABS tablet Take 10 mg by mouth daily. 04/05/21   [provider]  furosemide (LASIX) 40 MG tablet Take 40 mg by mouth daily.  Can take extra tablet for wt gain of 3 lbs in 24 hour or 5 lbs in one week. 03/15/23 06/13/23  [provider]  gabapentin (NEURONTIN) 300 MG capsule Take 300 mg by mouth 3 (three) times daily. 03/25/20   [provider]  insulin glargine (LANTUS) 100 UNIT/ML Solostar Pen Inject 50 Units into the skin 2 (two) times daily.    [provider]  insulin lispro (HUMALOG) 100 UNIT/ML injection Inject 5-15 Units into the skin 3 (three) times daily as  needed (blood sugar over 150).    [provider]  naloxone [facility]) nasal spray 4 mg/0.1 mL Place 4 mg into the nose once as needed (overdose). 03/31/21   [provider]  nitroGLYCERIN (NITROSTAT) 0.4 MG SL tablet Place 0.4 mg under the tongue every 5 (five) minutes as needed for chest pain.    [provider]  oxyCODONE-acetaminophen (PERCOCET) 10-325 MG tablet Take 1 tablet by mouth 4 (four) times daily.    [provider]  pantoprazole (PROTONIX) 40 MG tablet Take 40 mg by mouth daily.    [provider]  sacubitril-valsartan (ENTRESTO) 24-26 MG Take 1 tablet by mouth 2 (two) times daily. 07/05/23   Tereso Newcomer T, PA-C  spironolactone (ALDACTONE) 25 MG tablet Take 25 mg by mouth daily. 12/29/22   [provider]  TRINTELLIX 20 MG TABS tablet Take 20 mg  by mouth daily in the afternoon. 12/29/22   [provider]  VASCEPA 1 g capsule Take 2 capsules (2 g total) by mouth 2 (two) times daily. 06/22/23   Tereso Newcomer T, PA-C                                                                                                                                    Allergies Metformin and Hydrocodone  Review of Systems Review of Systems As noted in HPI  Physical Exam Vital Signs  I have reviewed the triage vital signs BP 100/83   Pulse (!) 35   Temp 97.7 F (36.5 C) (Oral)   Resp 18   Ht 5\' 9"  (1.753 m)   Wt 95.3 kg   SpO2 100%   BMI 31.01 kg/m   Physical Exam Vitals reviewed.  Constitutional:      General: He is not in acute distress.    Appearance: He is well-developed. He is not diaphoretic.  HENT:     Head: Normocephalic and atraumatic.     Nose: Nose normal.  Eyes:     General: No scleral icterus.       Right eye: No discharge.        Left eye: No discharge.     Conjunctiva/sclera: Conjunctivae normal.     Pupils: Pupils are equal, round, and reactive to light.  Neck:     Vascular: JVD present.  Cardiovascular:     Rate and Rhythm: Regular rhythm. Tachycardia present.     Heart sounds: No murmur heard.    No friction rub. No gallop.  Pulmonary:     Effort: Pulmonary effort is normal. No respiratory distress.     Breath sounds: Normal breath sounds. No stridor. No rales.  Abdominal:     General: There is no distension.     Palpations: Abdomen is soft.     Tenderness: There is no abdominal tenderness.  Musculoskeletal:        General: No tenderness.     Cervical back: Normal range  of motion and neck supple.     Right lower leg: 2+ Pitting Edema present.     Left lower leg: 2+ Pitting Edema present.  Skin:    General: Skin is warm and dry.     Findings: No erythema or rash.  Neurological:     Mental Status: He is alert and oriented to person, place, and time.     ED Results and Treatments Labs (all labs  ordered are listed, but only abnormal results are displayed) Labs Reviewed  BASIC METABOLIC PANEL WITH GFR - Abnormal; Notable for the following components:      Result Value   Sodium 134 (*)    Glucose, Bld 189 (*)    Creatinine, Ser 1.53 (*)    Calcium 8.8 (*)    GFR, Estimated 53 (*)    All other components within normal limits  CBC - Abnormal; Notable for the following components:   MCV 101.0 (*)    All other components within normal limits  BRAIN NATRIURETIC PEPTIDE - Abnormal; Notable for the following components:   B Natriuretic Peptide 1,074.8 (*)    All other components within normal limits  PROTIME-INR - Abnormal; Notable for the following components:   Prothrombin Time 18.2 (*)    INR 1.5 (*)    All other components within normal limits  I-STAT CHEM 8, ED - Abnormal; Notable for the following components:   Sodium 134 (*)    BUN 25 (*)    Creatinine, Ser 1.50 (*)    Glucose, Bld 177 (*)    Calcium, Ion 1.10 (*)    All other components within normal limits  I-STAT VENOUS BLOOD GAS, ED - Abnormal; Notable for the following components:   pCO2, Ven 37.8 (*)    Sodium 134 (*)    Calcium, Ion 1.09 (*)    All other components within normal limits  TROPONIN I (HIGH SENSITIVITY) - Abnormal; Notable for the following components:   Troponin I (High Sensitivity) 25 (*)    All other components within normal limits  MAGNESIUM  URINALYSIS, W/ REFLEX TO CULTURE (INFECTION SUSPECTED)  I-STAT CG4 LACTIC ACID, ED  TROPONIN I (HIGH SENSITIVITY)                                                                                                                         EKG  EKG Interpretation Date/Time:  Saturday March 01 2024 05:44:54 EDT Ventricular Rate:  142 PR Interval:  168 QRS Duration:  92 QT Interval:  278 QTC Calculation: 427 R Axis:   119  Text Interpretation: Sinus tachycardia vs flutter Anterolateral infarct , age undetermined T wave abnormality, consider inferior  ischemia Abnormal ECG When compared with ECG of 20-Dec-2022 09:09, PREVIOUS ECG IS PRESENT Confirmed by Drema Pry [id number]) on 03/01/2024 6:53:37 AM       Radiology DG Chest 2 View Result Date: 03/01/2024 CLINICAL DATA:  Chest pain. EXAM: CHEST - 2 VIEW COMPARISON:  03/29/2022 FINDINGS: Status post  median sternotomy. Stable cardiac enlargement. Small scratch set unchanged small bilateral pleural effusions and interstitial edema. No new findings. Visualized osseous structures are unremarkable. IMPRESSION: Unchanged small bilateral pleural effusions and interstitial edema. Electronically Signed   By: Signa Kell M.D.   On: 03/01/2024 06:48    Medications Ordered in ED Medications  etomidate (AMIDATE) injection 8 mg (has no administration in time range)   Procedures .1-3 Lead EKG Interpretation  Performed by: Nira Conn, MD Authorized by: Nira Conn, MD     Interpretation: abnormal     ECG rate:  80-150   ECG rate assessment: tachycardic     Rhythm: atrial flutter     Ectopy: none     Conduction: normal   .Critical Care  Performed by: Nira Conn, MD Authorized by: Nira Conn, MD   Critical care provider statement:    Critical care time (minutes):  30   Critical care was necessary to treat or prevent imminent or life-threatening deterioration of the following conditions:  Cardiac failure   Critical care was time spent personally by me on the following activities:  Development of treatment plan with patient or surrogate, discussions with consultants, evaluation of patient's response to treatment, examination of patient, ordering and review of laboratory studies, ordering and review of radiographic studies, ordering and performing treatments and interventions, pulse oximetry, re-evaluation of patient's condition and review of old charts   (including critical care time) Medical Decision Making / ED Course   Medical Decision  Making Amount and/or Complexity of Data Reviewed Labs: ordered. Decision-making details documented in ED Course. Radiology: ordered and independent interpretation performed. Decision-making details documented in ED Course. ECG/medicine tests: ordered and independent interpretation performed. Decision-making details documented in ED Course.  Risk Prescription drug management. Decision regarding hospitalization.    Worsening dyspnea on exertion with peripheral edema and intermittent chest pain  Differential diagnosis considered.  Workup below.  Patient noted to be slightly hypoxic with sats in the high 80s on room air.  Improved with 2 L nasal cannula.  Evidence of volume overload on exam concerning for CHF exacerbation.  Patient is noted to be in atrial flutter with intermittent block.  Heart rate temporarily jumps up to the 140s-150s, improved to the 70s to 80s after deep breath and hold.  Patient does not feel his heart rate go up to the 150s.  Initial EKG with tachycardia either sinus or favoring flutter given the 3-lead telemetry. Initial troponin slightly elevated at 25.  BNP greater than 1000.  Chest x-ray is consistent with CHF exacerbation. Patient's initial blood pressure was 110s.  In the room, blood pressure with systolic in the 80s.  Patient is otherwise asymptomatic.  Will need to discuss case with cardiology regarding appropriate diuresing.  7:18 AM: still waiting on cardiology callback.  Patient's blood pressure is improving without intervention at this time, up to systolics in the low 100s.  No leukocytosis or severe anemia on CBC.  Metabolic panel without significant electrolyte derangements.  Mild renal insufficiency without AKI.  7:41 AM Spoke with Dr. Jimmey Ralph from the cardiology service.  After discussion and review record, he recommended performing cardioversion in the emergency department and admitting for diuresing.  This was discussed with the patient who agree with  the plan.  Patient care turned over to oncoming provider, Dr. Elayne Snare who was in the room during discussion with the patient. He will perform the cardioversion. Patient case and results discussed in detail; please see their note for  further ED managment.       Final Clinical Impression(s) / ED Diagnoses Final diagnoses:  Atrial flutter with rapid ventricular response (HCC)  Acute on chronic combined systolic and diastolic CHF (congestive heart failure) (HCC)    This chart was dictated using voice recognition software.  Despite best efforts to proofread,  errors can occur which can change the documentation meaning.    Nira Conn, MD 03/01/24 [phone]

## 2024-03-01 NOTE — Assessment & Plan Note (Signed)
 Continue glucose cover and monitoring with insulin sliding scale.  His admission glucose with no severe elevation.  Continue with statin therapy

## 2024-03-01 NOTE — Progress Notes (Signed)
 Patient's systolic blood pressure has been 80's to 70's with MAP greater than 65. He has been asymptomatic with appropriate mentation.  No signs of hypoperfusion.   Because risk of hypotension will change level of care to ICU.  Continue blood pressure monitoring, continue heart failure medical therapy.

## 2024-03-01 NOTE — Assessment & Plan Note (Signed)
 Mild elevation of high sensitive troponin likely due to heart failure and tachycardia in the setting of coronary artery disease.   Patient with no chest pain.  Acute coronary syndrome ruled out.  Continue dual antiplatelet therapy with aspirin and clopidogrel.  Continue statin therapy.

## 2024-03-01 NOTE — ED Provider Notes (Signed)
  Physical Exam  BP 96/79 (BP Location: Left Arm)   Pulse (!) 141   Temp 98.4 F (36.9 C) (Oral)   Resp (!) 27   Ht 5\' 9"  (1.753 m)   Wt 95.3 kg   SpO2 97%   BMI 31.01 kg/m   Physical Exam Vitals and nursing note reviewed.  HENT:     Head: Normocephalic and atraumatic.  Eyes:     Pupils: Pupils are equal, round, and reactive to light.  Cardiovascular:     Rate and Rhythm: Tachycardia present. Rhythm irregular.  Pulmonary:     Effort: Pulmonary effort is normal.     Breath sounds: Normal breath sounds.  Abdominal:     Palpations: Abdomen is soft.     Tenderness: There is no abdominal tenderness.  Skin:    General: Skin is warm and dry.  Neurological:     Mental Status: He is alert.  Psychiatric:        Mood and Affect: Mood normal.     Procedures  Procedures  ED Course / MDM   Clinical Course as of 03/01/24 0836  Sat Mar 01, 2024  0829 Dr. Jimmey Ralph (cardiology) has evaluated patient here in the ED and has carefully reviewed his prior records.  Patient was started on Eliquis given finding of previous atrial thrombus.  At this time Dr. Jimmey Ralph would like another echo prior to cardioversion given high risk for thrombotic event.  Patient has been compliant with his Eliquis.  Dr. Jimmey Ralph recommends starting Amio bolus and drip.  We will plan for admission to medicine for diuresis and obtain repeat echo this weekend.  Planning for cardioversion with cardiology team in 2 days. [MP]  [phone] Discussed with admitting hospitalist who accepts patient for admission [MP]    Clinical Course User Index [MP] Royanne Foots, DO   Medical Decision Making I, Estelle June DO, have assumed care of this patient from the previous provider pending final plan from cardiology regarding cardioversion for atrial flutter and admission  Amount and/or Complexity of Data Reviewed Labs: ordered. Radiology: ordered.  Risk Prescription drug management. Decision regarding  hospitalization.          Royanne Foots, DO 03/01/24 [phone]

## 2024-03-01 NOTE — Consult Note (Signed)
 Cardiology Consultation   Patient ID: Ryan [name] MRN: [id number]; DOB: 02-21-1966  Admit date: 03/01/2024 Date of Consult: 03/01/2024  PCP:  Fleet Contras, MD   [facility] Providers Cardiologist:  [name], MD        History of Present Illness:   Ryan [name] is a 58 y.o. male with a hx of ongoing tobacco abuse, CAD s/p STEMI 12/2011 with DES to LAD, multiple PCIs - 10/2016, 07/2017, and 05/2019, CABG in 05/2020, NSTEMI 03/2022 with DES, PAD s/p aorto-bifem bypass in 2014, L carotid to subclavian bypass in 04/2013, R fem to AK popliteal bypass and R fem endarterectomy, angioplasty to right CFA 06/2021 and 12/2022, L femoral endarterectomy 01/2023, chronic systolic heart failure secondary to ischemic cardiomyopathy, HTN, DM, COPD who is being seen 03/01/2024 for the evaluation of shortness of breath at the request of Dr. Elayne Snare. patient reports worsening shortness of breath over the past week, particularly with exertion, now present even at rest.  He is unable to lay flat.  He also has noticed worsening lower extremity edema during this time as well, this has occurred despite taking his Lasix.  He denies any chest pain or palpitations.   Past Medical History:  Diagnosis Date   Acute myocardial infarction of other anterior wall, subsequent episode of care 12/12/2011   Anal fissure    Arthritis    back   Atherosclerosis of native artery of both lower extremities with intermittent claudication (HCC) 04/01/2018   Bruises easily    d/t being on Effient   CAD (coronary artery disease)    A. Acute Ant STEMI 12/12/2011    Cardiomyopathy secondary 12/26/2011   Chronic systolic CHF (congestive heart failure) (HCC)    ischemic CM // Echo 6/21: EF 30-35  //  Echocardiogram 10/21: LV apical thrombus resolved, EF 50-55, ant and apical, ant-lat HK, trivial MR, trivial AI   Chronic total occlusion of artery of the extremities (HCC) 02/26/2012   Claudication (HCC) 12/26/2011   Coronary  atherosclerosis of native coronary artery    a. ant STEMI with cardiac arrest 2013 s/p DES to mLAD. b. [place] 10/2016 s/p PTCA to mLAD.   Diabetes mellitus without complication (HCC)    Essential hypertension 10/23/2016   GERD (gastroesophageal reflux disease)    "takes tums"   History of blood transfusion    no abnormal reaction noted   History of kidney stones    Hypertension    Ischemic cardiomyopathy    a. EF 35% in 02/2012 at time of acute MI, improved to normal on subsequent imaging.   MI (myocardial infarction) (HCC)    AMI 1/13 - complicated by VT/Tosades   Mixed hyperlipidemia    Numbness and tingling of right arm 03/31/2013   Occlusion and stenosis of carotid artery without mention of cerebral infarction 08/26/2012   PAD (peripheral artery disease) (HCC)    followed by Vascular - aortobifem bypass 01/2013 & left carotid-subclavian artery bypass 04/2013   Peripheral vascular disease, unspecified (HCC) 12/16/2012   PVD (peripheral vascular disease) (HCC) 03/31/2013   Rectal polyp    Subclavian steal syndrome 08/11/2013   Uncontrolled diabetes mellitus 10/23/2016   Wears partial dentures    upper    Past Surgical History:  Procedure Laterality Date   ABDOMINAL AORTOGRAM W/LOWER EXTREMITY N/A 06/26/2017   Procedure: Abdominal Aortogram w/Lower Extremity;  Surgeon: Nada Libman, MD;  [facility]: [facility];  Service: Cardiovascular;  Laterality: N/A;   ABDOMINAL AORTOGRAM W/LOWER EXTREMITY N/A 06/28/2021  Procedure: ABDOMINAL AORTOGRAM W/LOWER EXTREMITY;  Surgeon: Nada Libman, MD;  Location: [facility];  Service: Cardiovascular;  Laterality: N/A;   ABDOMINAL AORTOGRAM W/LOWER EXTREMITY N/A 01/02/2023   Procedure: ABDOMINAL AORTOGRAM W/LOWER EXTREMITY;  Surgeon: Nada Libman, MD;  Location: [facility];  Service: Cardiovascular;  Laterality: N/A;   ANAL FISSURECTOMY     AORTA - BILATERAL FEMORAL ARTERY BYPASS GRAFT N/A 01/16/2013   Procedure: AORTA  BIFEMORAL BYPASS GRAFT;  Surgeon: Nada Libman, MD;  Location: [facility] OR;  Service: Vascular;  Laterality: N/A;   APPENDECTOMY     CARDIAC CATHETERIZATION N/A 10/23/2016   Procedure: Left Heart Cath and Coronary Angiography;  Surgeon: Kathleene Hazel, MD;  Location: [facility] INVAS[facility] CV LAB;  Service: Cardiovascular;  Laterality: N/A;   CARDIAC CATHETERIZATION N/A 10/23/2016   Procedure: Coronary Balloon Angioplasty;  Surgeon: Kathleene Hazel, MD;  Location: [facility] INVASIVE CV LAB;  Service: Cardiovascular;  Laterality: N/A;   CAROTID-SUBCLAVIAN BYPASS GRAFT Left 04/03/2013   Procedure: BYPASS GRAFT CAROTID-SUBCLAVIAN;  Surgeon: Nada Libman, MD;  Location: [facility] OR;  Service: Vascular;  Laterality: Left;   CIRCUMCISION N/A 12/15/2015   Procedure: CIRCUMCISION ADULT;  Surgeon: Malen Gauze, MD;  Location: [facility] ORS;  Service: Urology;  Laterality: N/A;   COLONOSCOPY     CORONARY ANGIOPLASTY     stent placed Dec 12, 2011 and 2018   CORONARY ARTERY BYPASS GRAFT N/A 05/05/2020   Procedure: CORONARY ARTERY BYPASS GRAFTING (CABG) x Three, Using Left internal Mammary Artery and Left Leg greater saphenous vein harvested endoscopically;  Surgeon: Delight Ovens, MD;  Location: [facility] OR;  Service: Open Heart Surgery;  Laterality: N/A;   CORONARY BALLOON ANGIOPLASTY N/A 05/23/2019   Procedure: CORONARY BALLOON ANGIOPLASTY;  Surgeon: Kathleene Hazel, MD;  [facility];  Service: Cardiovascular;  Laterality: N/A;   CORONARY PRESSURE/FFR STUDY N/A 04/22/2020   Procedure: INTRAVASCULAR PRESSURE WIRE/FFR STUDY;  Surgeon: Kathleene Hazel, MD;  [facility];  Service: Cardiovascular;  Laterality: N/A;   DIAGNOSTIC LAPAROSCOPY     ENDARTERECTOMY FEMORAL Right 03/13/2018   Procedure: FEMORAL ENDARTERECTOMY RIGHT;  Surgeon: Nada Libman, MD;  Location: [facility];  Service: Vascular;  Laterality: Right;   ENDARTERECTOMY FEMORAL Left 01/26/2023   Procedure: REDO LEFT  COMMON FEMORAL ENDARTERECTOMY WITH VPA;  Surgeon: Nada Libman, MD;  Location: [facility] OR;  Service: Vascular;  Laterality: Left;   FEMORAL-POPLITEAL BYPASS GRAFT Right 03/13/2018   Procedure: FEMORAL-POPLITEAL ARTERY BYPASS WITH NON-REVERSE VEIN RIGHT;  Surgeon: Nada Libman, MD;  Location: [facility];  Service: Vascular;  Laterality: Right;   GROIN DISSECTION Right 03/13/2018   Procedure: RE-DO COMMON FEMORAL ARTERY EXPOSURE;  Surgeon: Nada Libman, MD;  Location: [facility];  Service: Vascular;  Laterality: Right;   I & D EXTREMITY Left 04/18/2013   Procedure: debridement of left neck lymphocele;  Surgeon: Nada Libman, MD;  Location: [facility] OR;  Service: Vascular;  Laterality: Left;  I and D of left neck   I & D EXTREMITY Right 11/21/2014   Procedure: IRRIGATION AND DEBRIDEMENT RIGHT HAND;  Surgeon: Dominica Severin, MD;  Location: [facility] OR;  Service: Orthopedics;  Laterality: Right;   LEFT HEART CATH AND CORONARY ANGIOGRAPHY N/A 05/23/2019   Procedure: LEFT HEART CATH AND CORONARY ANGIOGRAPHY;  Surgeon: Kathleene Hazel, MD;  [facility];  Service: Cardiovascular;  Laterality: N/A;   LEFT HEART CATH AND CORONARY ANGIOGRAPHY N/A 04/22/2020  Procedure: LEFT HEART CATH AND CORONARY ANGIOGRAPHY;  Surgeon: Kathleene Hazel, MD;  [facility];  Service: Cardiovascular;  Laterality: N/A;   LEFT HEART CATHETERIZATION WITH CORONARY ANGIOGRAM N/A 12/12/2011   Procedure: LEFT HEART CATHETERIZATION WITH CORONARY ANGIOGRAM;  Surgeon: Kathleene Hazel, MD;  Location: [facility] CATH LAB;  Service: Cardiovascular;  Laterality: N/A;   LOWER EXTREMITY ANGIOGRAM N/A 01/31/2012   Procedure: LOWER EXTREMITY ANGIOGRAM;  Surgeon: Kathleene Hazel, MD;  Location: [facility] CATH LAB;  Service: Cardiovascular;  Laterality: N/A;   LOWER EXTREMITY ANGIOGRAPHY N/A 04/23/2018   Procedure: LOWER EXTREMITY ANGIOGRAPHY;  Surgeon: Nada Libman, MD;  [facility];  Service:  Cardiovascular;  Laterality: N/A;   PATCH ANGIOPLASTY Left 01/26/2023   Procedure: PATCH ANGIOPLASTY USING HEMASHIELD PLATINUM FINESSE;  Surgeon: Nada Libman, MD;  Location: [facility] OR;  Service: Vascular;  Laterality: Left;   PERCUTANEOUS CORONARY STENT INTERVENTION (PCI-S) Right 12/12/2011   Procedure: PERCUTANEOUS CORONARY STENT INTERVENTION (PCI-S);  Surgeon: Kathleene Hazel, MD;  Location: [facility] CATH LAB;  Service: Cardiovascular;  Laterality: Right;   PERIPHERAL VASCULAR BALLOON ANGIOPLASTY Right 06/28/2021   Procedure: PERIPHERAL VASCULAR BALLOON ANGIOPLASTY;  Surgeon: Nada Libman, MD;  [facility];  Service: Cardiovascular;  Laterality: Right;  common femoral (graft)   PERIPHERAL VASCULAR BALLOON ANGIOPLASTY  01/02/2023   Procedure: PERIPHERAL VASCULAR BALLOON ANGIOPLASTY;  Surgeon: Nada Libman, MD;  [facility];  Service: Cardiovascular;;   PERIPHERAL VASCULAR INTERVENTION Left 04/23/2018   Procedure: PERIPHERAL VASCULAR INTERVENTION;  Surgeon: Nada Libman, MD;  [facility];  Service: Cardiovascular;  Laterality: Left;  superficial femoral   REPAIR EXTENSOR TENDON Right 11/21/2014   Procedure: WITH REPAIR/RECONSTRUCTION OF EXTENSOR TENDONS AS NEEDED;  Surgeon: Dominica Severin, MD;  Location: [facility] OR;  Service: Orthopedics;  Laterality: Right;   TEE WITHOUT CARDIOVERSION N/A 05/05/2020   Procedure: TRANSESOPHAGEAL ECHOCARDIOGRAM (TEE);  Surgeon: Delight Ovens, MD;  Location: [facility] OR;  Service: Open Heart Surgery;  Laterality: N/A;     Inpatient Medications: Scheduled Meds:  etomidate  8 mg Intravenous Once    Allergies:    Allergies  Allergen Reactions   Metformin Anaphylaxis    Water blisters between fingers   Hydrocodone Hives and Nausea And Vomiting    Social History:   Social History   Socioeconomic History   Marital status: Divorced    Spouse name: Not on file   Number of children: 1   Years of education:  Not on file   Highest education level: High school graduate  Occupational History    Employer: OTHER   Occupation: Disablity    Comment: Since 2013  Tobacco Use   Smoking status: Some Days    Current packs/day: 0.25    Average packs/day: 0.3 packs/day for 25.0 years (6.3 ttl pk-yrs)    Types: Cigarettes   Smokeless tobacco: Never   Tobacco comments:    Smoking cessation  Vaping Use   Vaping status: Never Used  Substance and Sexual Activity   Alcohol use: Yes    Alcohol/week: 2.0 standard drinks of alcohol    Types: 2 Cans of beer per week    Comment: socially   Drug use: No   Sexual activity: Yes  Other Topics Concern   Not on file  Social History Narrative   Disabled - no longer works   Social Drivers of Corporate investment banker Strain: Low Risk  (03/30/2022)  Overall Financial Resource Strain (CARDIA)    Difficulty of Paying Living Expenses: Not very hard  Food Insecurity: Low Risk  (03/14/2023)   [facility]   Hunger Vital Sign    Worried About Running Out of Food in the Last Year: Never true    Ran Out of Food in the Last Year: Never true  Transportation Needs: Not on file (03/14/2023)  Physical Activity: Not on file  Stress: Not on file  Social Connections: Not on file  Intimate Partner Violence: Not on file    Family History:   Family History  Problem Relation Age of Onset   Cancer Father        Lung   Diabetes Mother    COPD Mother    Cancer Maternal Uncle        Colon     ROS:  Please see the history of present illness.   All other ROS reviewed and negative.     Physical Exam/Data:   Vitals:   03/01/24 0543 03/01/24 0602 03/01/24 0615 03/01/24 0645  BP: 112/84 (!) 84/55 (!) 87/74 100/83  Pulse: (!) 120 62 (!) 57 (!) 35  Resp: (!) 24 15 20 18   Temp: 97.7 F (36.5 C)     TempSrc: Oral     SpO2: (!) 88% 100% 100% 100%  Weight:      Height:        Intake/Output Summary (Last 24 hours) at 03/01/2024  0810 Last data filed at 03/01/2024 0743 Gross per 24 hour  Intake --  Output 225 ml  Net -225 ml      03/01/2024    5:40 AM 06/22/2023    8:08 AM 03/26/2023   10:58 AM  Last 3 Weights  Weight (lbs) 210 lb 220 lb 220 lb 12.8 oz  Weight (kg) 95.255 kg 99.791 kg 100.154 kg     Body mass index is 31.01 kg/m.  I have seen, examined the patient, and reviewed the above assessment and plan.    GEN: No acute distress.   Neck: JVD elevated Cardiac: Tachycardic and regular Resp: Bibasilar rales Ext: 2+ edema bilaterally Neuro: No gross focal deficits Psych: Normal affect   EKG:  The EKG was personally reviewed and demonstrates: 2-1 atrial flutter Telemetry:  Telemetry was personally reviewed and demonstrates: Predominantly 2-1 atrial flutter, symptoms variable block.  Laboratory Data:  High Sensitivity Troponin:   Recent Labs  Lab 03/01/24 0544  TROPONINIHS 25*     Chemistry Recent Labs  Lab 03/01/24 0544 03/01/24 0630 03/01/24 0637 03/01/24 0649  NA 134*  --  134* 134*  K 4.7  --  4.5 4.5  CL 100  --   --  101  CO2 26  --   --   --   GLUCOSE 189*  --   --  177*  BUN 19  --   --  25*  CREATININE 1.53*  --   --  1.50*  CALCIUM 8.8*  --   --   --   MG  --  2.2  --   --   GFRNONAA 53*  --   --   --   ANIONGAP 8  --   --   --     No results for input(s): "PROT", "ALBUMIN", "AST", "ALT", "ALKPHOS", "BILITOT" in the last 168 hours. Lipids No results for input(s): "CHOL", "TRIG", "HDL", "LABVLDL", "LDLCALC", "CHOLHDL" in the last 168 hours.  Hematology Recent Labs  Lab 03/01/24 0544 03/01/24  1610 03/01/24 0649  WBC 8.2  --   --   RBC 4.88  --   --   HGB 16.1 16.3 17.0  HCT 49.3 48.0 50.0  MCV 101.0*  --   --   MCH 33.0  --   --   MCHC 32.7  --   --   RDW 14.2  --   --   PLT 171  --   --    Thyroid No results for input(s): "TSH", "FREET4" in the last 168 hours.  BNP Recent Labs  Lab 03/01/24 0544  BNP 1,074.8*    DDimer No results for input(s): "DDIMER" in  the last 168 hours.   Radiology/Studies:  DG Chest 2 View Result Date: 03/01/2024 CLINICAL DATA:  Chest pain. EXAM: CHEST - 2 VIEW COMPARISON:  03/29/2022 FINDINGS: Status post median sternotomy. Stable cardiac enlargement. Small scratch set unchanged small bilateral pleural effusions and interstitial edema. No new findings. Visualized osseous structures are unremarkable. IMPRESSION: Unchanged small bilateral pleural effusions and interstitial edema. Electronically Signed   By: Signa Kell M.D.   On: 03/01/2024 06:48     Assessment and Plan:   #.  Typical atrial flutter: Likely the etiology for his decompensated heart failure. -Start IV amiodarone bolus and infusion. -Continue Eliquis 5 mg twice daily with no interruption. -Patient does not convert with amiodarone then we will pursue cardioversion on Monday.  We will get a transthoracic echocardiogram in the interim.  #.  Acute on chronic combined systolic and diastolic heart failure: Likely triggered by atrial flutter. -Admit for IV diuresis.  Will start with IV Lasix 80 mg twice daily and uptitrate to achieve goal urinary output of net -2L.  Close monitoring of electrolytes and replete as needed. -Continue home GDMT -Entresto 24-26 mg twice daily, spironolactone 25 mg once daily, carvedilol 6.25 mg twice daily.  Would hold on Farxiga given the patient is likely to need cardioversion. -Repeat transthoracic echocardiogram with Definity.  #.  History of LV thrombus: This occurred years ago in the setting of acute MI.  He has been on Eliquis he states for at least 3 years.  There is no mention of LV thrombus on recent echocardiograms. -Repeat echocardiogram with Definity.  #.  CAD status post multiple PCI's and CABG: Currently denies chest pain.  Troponins are similar to baseline. #. Hyperlipidemia:  -Continue clopidogrel.  Given that he is also on Eliquis we will hold aspirin. -Continue atorvastatin 80 mg daily. Continue Zetia 10 mg  daily.   For questions or updates, please contact  [facility] Please consult [email or web address] for contact info under    Signed, Nobie Putnam, MD  03/01/2024 8:10 AM

## 2024-03-02 ENCOUNTER — Inpatient Hospital Stay (HOSPITAL_COMMUNITY)

## 2024-03-02 ENCOUNTER — Other Ambulatory Visit: Payer: Self-pay

## 2024-03-02 DIAGNOSIS — I5043 Acute on chronic combined systolic (congestive) and diastolic (congestive) heart failure: Secondary | ICD-10-CM

## 2024-03-02 DIAGNOSIS — I5021 Acute systolic (congestive) heart failure: Secondary | ICD-10-CM

## 2024-03-02 DIAGNOSIS — R570 Cardiogenic shock: Secondary | ICD-10-CM | POA: Diagnosis not present

## 2024-03-02 DIAGNOSIS — I483 Typical atrial flutter: Secondary | ICD-10-CM | POA: Diagnosis not present

## 2024-03-02 DIAGNOSIS — I5023 Acute on chronic systolic (congestive) heart failure: Secondary | ICD-10-CM | POA: Diagnosis not present

## 2024-03-02 DIAGNOSIS — I2510 Atherosclerotic heart disease of native coronary artery without angina pectoris: Secondary | ICD-10-CM | POA: Diagnosis not present

## 2024-03-02 LAB — SODIUM, URINE, RANDOM: Sodium, Ur: <10 mmol/L

## 2024-03-02 LAB — GLUCOSE, CAPILLARY
Glucose-Capillary: 247 mg/dL — ABNORMAL HIGH (ref 70–99)
Glucose-Capillary: 306 mg/dL — ABNORMAL HIGH (ref 70–99)
Glucose-Capillary: 246 mg/dL — ABNORMAL HIGH (ref 70–99)
Glucose-Capillary: 173 mg/dL — ABNORMAL HIGH (ref 70–99)
Glucose-Capillary: 238 mg/dL — ABNORMAL HIGH (ref 70–99)
Glucose-Capillary: 174 mg/dL — ABNORMAL HIGH (ref 70–99)

## 2024-03-02 LAB — COOXEMETRY PANEL
Carboxyhemoglobin: 1.6 % — ABNORMAL HIGH (ref 0.5–1.5)
Carboxyhemoglobin: 1.6 % — ABNORMAL HIGH (ref 0.5–1.5)
Methemoglobin: <0.7 % (ref 0.0–1.5)
Methemoglobin: <0.7 % (ref 0.0–1.5)
O2 Saturation: 67.5 %
O2 Saturation: 58.6 %
Total hemoglobin: 14.5 g/dL (ref 12.0–16.0)
Total hemoglobin: 15.4 g/dL (ref 12.0–16.0)

## 2024-03-02 LAB — ECHOCARDIOGRAM COMPLETE
Height: 69 in
S' Lateral: 4.7 cm
Weight: 3950.64 [oz_av]

## 2024-03-02 LAB — BASIC METABOLIC PANEL WITH GFR
Anion gap: 12 (ref 5–15)
BUN: 34 mg/dL — ABNORMAL HIGH (ref 6–20)
CO2: 26 mmol/L (ref 22–32)
Calcium: 8.8 mg/dL — ABNORMAL LOW (ref 8.9–10.3)
Chloride: 90 mmol/L — ABNORMAL LOW (ref 98–111)
Creatinine, Ser: 1.84 mg/dL — ABNORMAL HIGH (ref 0.61–1.24)
GFR, Estimated: 42 mL/min — ABNORMAL LOW (ref >60)
Glucose, Bld: 303 mg/dL — ABNORMAL HIGH (ref 70–99)
Potassium: 3.7 mmol/L (ref 3.5–5.1)
Sodium: 128 mmol/L — ABNORMAL LOW (ref 135–145)

## 2024-03-02 LAB — MAGNESIUM: Magnesium: 1.9 mg/dL (ref 1.7–2.4)

## 2024-03-02 LAB — TROPONIN I (HIGH SENSITIVITY): Troponin I (High Sensitivity): 29 ng/L — ABNORMAL HIGH (ref <18)

## 2024-03-02 LAB — CBC
HCT: 45.4 % (ref 39.0–52.0)
Hemoglobin: 14.8 g/dL (ref 13.0–17.0)
MCH: 32.5 pg (ref 26.0–34.0)
MCHC: 32.6 g/dL (ref 30.0–36.0)
MCV: 99.6 fL (ref 80.0–100.0)
Platelets: 216 10*3/uL (ref 150–400)
RBC: 4.56 MIL/uL (ref 4.22–5.81)
RDW: 14 % (ref 11.5–15.5)
WBC: 10.4 10*3/uL (ref 4.0–10.5)
nRBC: 0 % (ref 0.0–0.2)

## 2024-03-02 LAB — LIPID PANEL
Cholesterol: 70 mg/dL (ref 0–200)
HDL: 30 mg/dL — ABNORMAL LOW (ref >40)
LDL Cholesterol: 25 mg/dL (ref 0–99)
Total CHOL/HDL Ratio: 2.3 ratio
Triglycerides: 75 mg/dL (ref <150)
VLDL: 15 mg/dL (ref 0–40)

## 2024-03-02 LAB — CREATININE, URINE, RANDOM: Creatinine, Urine: 244 mg/dL

## 2024-03-02 LAB — LACTIC ACID, PLASMA: Lactic Acid, Venous: 1.4 mmol/L (ref 0.5–1.9)

## 2024-03-02 MED ORDER — POTASSIUM CHLORIDE CRYS ER 20 MEQ PO TBCR
40.0000 meq | EXTENDED_RELEASE_TABLET | Freq: Once | ORAL | Status: AC
  Administered 2024-03-02: 40 meq via ORAL
  Filled 2024-03-02: qty 2

## 2024-03-02 MED ORDER — INSULIN ASPART 100 UNIT/ML IJ SOLN
10.0000 [IU] | Freq: Three times a day (TID) | INTRAMUSCULAR | Status: DC
  Administered 2024-03-02 – 2024-03-06 (×11): 10 [IU] via SUBCUTANEOUS

## 2024-03-02 MED ORDER — INSULIN ASPART 100 UNIT/ML IJ SOLN: 0.0000 [IU] | Freq: Three times a day (TID) | INTRAMUSCULAR | Status: DC

## 2024-03-02 MED ORDER — INSULIN ASPART 100 UNIT/ML IJ SOLN: 0.0000 [IU] | Freq: Every day | INTRAMUSCULAR | Status: DC

## 2024-03-02 MED ORDER — FUROSEMIDE 10 MG/ML IJ SOLN
160.0000 mg | Freq: Once | INTRAVENOUS | Status: AC
  Administered 2024-03-02: 160 mg via INTRAVENOUS
  Filled 2024-03-02: qty 10
  Filled 2024-03-02: qty 16

## 2024-03-02 MED ORDER — METOLAZONE 5 MG PO TABS
5.0000 mg | ORAL_TABLET | Freq: Once | ORAL | Status: AC
  Administered 2024-03-02: 5 mg via ORAL
  Filled 2024-03-02: qty 1

## 2024-03-02 MED ORDER — INSULIN GLARGINE-YFGN 100 UNIT/ML ~~LOC~~ SOLN
35.0000 [IU] | Freq: Two times a day (BID) | SUBCUTANEOUS | Status: DC
  Administered 2024-03-02 – 2024-03-06 (×9): 35 [IU] via SUBCUTANEOUS
  Filled 2024-03-02 (×11): qty 0.35

## 2024-03-02 MED ORDER — SODIUM CHLORIDE 0.9% FLUSH
10.0000 mL | Freq: Two times a day (BID) | INTRAVENOUS | Status: DC
Start: 2024-03-02 — End: 2024-03-06
  Administered 2024-03-02: 40 mL
  Administered 2024-03-02: 30 mL
  Administered 2024-03-03 (×2): 10 mL
  Administered 2024-03-04: 20 mL
  Administered 2024-03-04 – 2024-03-05 (×3): 10 mL
  Administered 2024-03-06: 30 mL

## 2024-03-02 MED ORDER — FUROSEMIDE 10 MG/ML IJ SOLN
160.0000 mg | Freq: Once | INTRAVENOUS | Status: AC
  Administered 2024-03-02: 160 mg via INTRAVENOUS
  Filled 2024-03-02: qty 16
  Filled 2024-03-02: qty 10

## 2024-03-02 MED ORDER — SODIUM CHLORIDE 0.9% FLUSH: 10.0000 mL | INTRAVENOUS | Status: DC | PRN

## 2024-03-02 MED ORDER — INSULIN ASPART 100 UNIT/ML IJ SOLN
0.0000 [IU] | Freq: Three times a day (TID) | INTRAMUSCULAR | Status: DC
  Administered 2024-03-02: 4 [IU] via SUBCUTANEOUS
  Administered 2024-03-02: 7 [IU] via SUBCUTANEOUS
  Administered 2024-03-03: 4 [IU] via SUBCUTANEOUS
  Administered 2024-03-03: 7 [IU] via SUBCUTANEOUS
  Administered 2024-03-03: 11 [IU] via SUBCUTANEOUS
  Administered 2024-03-04: 4 [IU] via SUBCUTANEOUS
  Administered 2024-03-04: 7 [IU] via SUBCUTANEOUS
  Administered 2024-03-04: 3 [IU] via SUBCUTANEOUS
  Administered 2024-03-05: 4 [IU] via SUBCUTANEOUS
  Administered 2024-03-05: 11 [IU] via SUBCUTANEOUS
  Administered 2024-03-06: 3 [IU] via SUBCUTANEOUS

## 2024-03-02 MED ORDER — GABAPENTIN 100 MG PO CAPS: 100.0000 mg | ORAL_CAPSULE | Freq: Three times a day (TID) | ORAL | Status: DC

## 2024-03-02 MED ORDER — PERFLUTREN LIPID MICROSPHERE
1.0000 mL | INTRAVENOUS | Status: AC | PRN
  Administered 2024-03-02: 2 mL via INTRAVENOUS

## 2024-03-02 NOTE — Progress Notes (Addendum)
 Advanced Heart Failure Rounding Note  Cardiologist: [name], MD  Chief Complaint:  Subjective:    Paitent feeling better on inotropic support, IV lasix given this monring with good result, 2L negative. Plan for aggressive diuresis and tentative cardioversion in the AM, likely at bedside with CCM. Has been compliant with anticoagulation.   Objective:   Weight Range: 112 kg Body mass index is 36.46 kg/m.   Vital Signs:   Temp:  [97.5 F (36.4 C)-98.5 F (36.9 C)] 97.9 F (36.6 C) (03/30 1200) Pulse Rate:  [43-141] 136 (03/30 1415) Resp:  [16-34] 23 (03/30 1415) BP: (61-138)/(47-117) 138/85 (03/30 1415) SpO2:  [77 %-100 %] 87 % (03/30 1415) Weight:  [110.1 kg-112 kg] 112 kg (03/30 0500) Last BM Date : 03/01/24  Weight change: Filed Weights   03/01/24 0540 03/01/24 1700 03/02/24 0500  Weight: 95.3 kg 110.1 kg 112 kg    Intake/Output:   Intake/Output Summary (Last 24 hours) at 03/02/2024 1451 Last data filed at 03/02/2024 1400 Gross per 24 hour  Intake 2366.15 ml  Output 2080 ml  Net 286.15 ml      Physical Exam     GENERAL: Chronically ill appearing PULM:  Normal work of breathing, CARDIAC:  JVP: elevated past the earlobe         Normal rate, irregular rhythm, systolic murmur. ABDOMEN: Soft, non-tender, moderately distended. NEUROLOGIC: Patient is oriented x3 with no focal or lateralizing neurologic deficits.      Labs    CBC Recent Labs    03/01/24 2138 03/02/24 0349  WBC 8.5 10.4  HGB 15.9 14.8  HCT 48.3 45.4  MCV 100.4* 99.6  PLT 194 216   Basic Metabolic Panel Recent Labs    40/98/11 0544 03/01/24 0630 03/01/24 0637 03/01/24 0649 03/01/24 2138  NA 134*  --    < > 134* 131*  K 4.7  --    < > 4.5 4.6  CL 100  --   --  101 98  CO2 26  --   --   --  23  GLUCOSE 189*  --   --  177* 122*  BUN 19  --   --  25* 28*  CREATININE 1.53*  --   --  1.50* 2.21*  CALCIUM 8.8*  --   --   --  8.4*  MG  --  2.2  --   --  2.2  PHOS   --   --   --   --  5.1*   < > = values in this interval not displayed.   Liver Function Tests No results for input(s): "AST", "ALT", "ALKPHOS", "BILITOT", "PROT", "ALBUMIN" in the last 72 hours. No results for input(s): "LIPASE", "AMYLASE" in the last 72 hours. Cardiac Enzymes No results for input(s): "CKTOTAL", "CKMB", "CKMBINDEX", "TROPONINI" in the last 72 hours.  BNP: BNP (last 3 results) Recent Labs    03/01/24 0544  BNP 1,074.8*    ProBNP (last 3 results) No results for input(s): "PROBNP" in the last 8760 hours.   D-Dimer No results for input(s): "DDIMER" in the last 72 hours. Hemoglobin A1C Recent Labs    03/01/24 1037  HGBA1C 8.9*   Fasting Lipid Panel No results for input(s): "CHOL", "HDL", "LDLCALC", "TRIG", "CHOLHDL", "LDLDIRECT" in the last 72 hours. Thyroid Function Tests No results for input(s): "TSH", "T4TOTAL", "T3FREE", "THYROIDAB" in the last 72 hours.  Invalid input(s): "FREET3"  Other results:    Medications:     Scheduled Medications:  apixaban  5 mg Oral BID   atorvastatin  80 mg Oral q1800   Chlorhexidine Gluconate Cloth  6 each Topical Daily   clopidogrel  75 mg Oral Daily   diazepam  5 mg Oral QHS   ezetimibe  10 mg Oral Daily   insulin aspart  0-20 Units Subcutaneous TID WC   insulin aspart  0-5 Units Subcutaneous QHS   insulin aspart  10 Units Subcutaneous TID WC   insulin glargine-yfgn  35 Units Subcutaneous BID   loratadine  10 mg Oral Daily   nicotine  21 mg Transdermal Daily   pantoprazole  40 mg Oral Daily   sodium chloride flush  10-40 mL Intracatheter Q12H    Infusions:  amiodarone 30 mg/hr (03/02/24 1400)   milrinone 0.25 mcg/kg/min (03/02/24 1400)   norepinephrine (LEVOPHED) Adult infusion 4 mcg/min (03/02/24 1400)    PRN Medications: acetaminophen **OR** acetaminophen, albuterol, cyclobenzaprine, nitroGLYCERIN, ondansetron **OR** ondansetron (ZOFRAN) IV, oxyCODONE-acetaminophen **AND** oxyCODONE, sodium chloride  flush    Patient Profile   Patient is a 58 year old male with significant atherosclerotic disease including multiple PCI's, CABG in 2021, PAD, ongoing tobacco abuse, paroxysmal atrial fibrillation, and chronic systolic heart failure who presents with acute on chronic systolic heart failure in the setting of new atrial flutter.  He decompensated on the floor and went into cardiogenic shock requiring inotropes and vasopressors.  Assessment/Plan    Cardiogenic shock Acute on chronic systolic heart failure - Poor urine output on the floor with associated hypotension, elevated lactic acid - Placed on IV milrinone/norepi with improvement - Decompensation likely in the setting of atrial flutter - If good response to IV diuresis then can cardiovert tomorrow - IV lasix 160mg  x2 - Afternoon BMP - GDMT held with ongoing shock  CAD - Prior CABG in 2024.  - EF decreased to 25-30% in the setting of shock/aflutter. Suspect limited utility to LHC at the moment, especially with AKI  Atrial flutter/fibrillation:  - Right now in flutter with controlled rates, history of pAF.  - Sleep study at discharge - Continue IV amiodarone - Continue apixaban 5mg  BID, no recent missed doses in the last 3 weeks - NPO for potential DCCV tomorrow  PAD:  - Continue apixaban/statin  HLD:  - Continue atorvastatin/zetia - Repeat lipid panel, consider follow up with lipid clinic  Medication concerns reviewed with patient and pharmacy team. Barriers identified: Anticoagulation management, electrolyte repletion  Length of Stay: 1  Romie Minus, MD  03/02/2024, 2:51 PM  Advanced Heart Failure Team Pager [phone] (M-F; 7a - 5p)  Please contact [facility] for night-coverage after hours (5p -7a ) and weekends on [email or web address]  CRITICAL CARE Performed by: Romie Minus   Total critical care time: 38 minutes  Critical care time was exclusive of separately billable procedures and treating other  patients.  Critical care was necessary to treat or prevent imminent or life-threatening deterioration.  Critical care was time spent personally by me on the following activities: development of treatment plan with patient and/or surrogate as well as nursing, discussions with consultants, evaluation of patient's response to treatment, examination of patient, obtaining history from patient or surrogate, ordering and performing treatments and interventions, ordering and review of laboratory studies, ordering and review of radiographic studies, pulse oximetry and re-evaluation of patient's condition.

## 2024-03-02 NOTE — Progress Notes (Signed)
 Peripherally Inserted Central Catheter Placement  The IV Nurse has discussed with the patient and/or persons authorized to consent for the patient, the purpose of this procedure and the potential benefits and risks involved with this procedure.  The benefits include less needle sticks, lab draws from the catheter, and the patient may be discharged home with the catheter. Risks include, but not limited to, infection, bleeding, blood clot (thrombus formation), and puncture of an artery; nerve damage and irregular heartbeat and possibility to perform a PICC exchange if needed/ordered by physician.  Alternatives to this procedure were also discussed.  Bard Power PICC patient education guide, fact sheet on infection prevention and patient information card has been provided to patient /or left at bedside.    PICC Placement Documentation  PICC Triple Lumen 03/02/24 Right Basilic 44 cm 1 cm (Active)  Indication for Insertion or Continuance of Line Vasoactive infusions 03/02/24 0830  Exposed Catheter (cm) 1 cm 03/02/24 0830  Site Assessment Clean, Dry, Intact 03/02/24 0830  Lumen #1 Status Blood return noted;Flushed;Saline locked 03/02/24 0830  Lumen #2 Status Blood return noted;Flushed;Saline locked 03/02/24 0830  Lumen #3 Status Blood return noted;Flushed;Saline locked 03/02/24 0830  Dressing Type Transparent;Securing device 03/02/24 0830  Dressing Status Antimicrobial disc/dressing in place 03/02/24 0830  Line Care Connections checked and tightened 03/02/24 0830  Line Adjustment (NICU/IV Team Only) No 03/02/24 0830  Dressing Intervention New dressing;Adhesive placed at insertion site (IV team only) 03/02/24 0830  Dressing Change Due 03/09/24 03/02/24 0830       Christeen Douglas 03/02/2024, 8:46 AM

## 2024-03-02 NOTE — Progress Notes (Signed)
 03/02/2024 Case discussed with AHF team; no real non-cardiac issues, they will take over as primary and PCCM available PRN.  Myrla Halsted MD PCCM

## 2024-03-02 NOTE — Progress Notes (Signed)
 eLink Physician-Brief Progress Note Patient Name: Ryan [name] DOB: 22-Apr-1966 MRN: [id number]   Date of Service  03/02/2024  HPI/Events of Note  58 year old male with an extensive cardiac history including multiple PCI's, coronary artery bypass grafting as well as severe peripheral arterial disease admitted with atrial for flutter and weight gain.  Last known EF of 35%.  Transferred to ICU for cardiogenic shock with poor urinary output.  eICU Interventions  Patient's chart reviewed.  Pertinent labs and imaging studies reviewed.  Video assessment of patient done. Cardiogenic shock Acute exacerbation of systolic and diastolic heart failure Atrial flutter Oliguric renal failure  Currently on both inotropic and vasopressor support. Diuresis being attempted but appears to be inadequate. GDMT on hold for hypotension.  Continue antiplatelet therapy as well as anticoagulation. Discussed with bedside nurse.     Intervention Category Evaluation Type: New Patient Evaluation  Carilyn Goodpasture 03/02/2024, 3:10 AM

## 2024-03-03 DIAGNOSIS — I4892 Unspecified atrial flutter: Secondary | ICD-10-CM | POA: Diagnosis not present

## 2024-03-03 DIAGNOSIS — I5023 Acute on chronic systolic (congestive) heart failure: Secondary | ICD-10-CM | POA: Diagnosis not present

## 2024-03-03 LAB — GLUCOSE, CAPILLARY
Glucose-Capillary: 255 mg/dL — ABNORMAL HIGH (ref 70–99)
Glucose-Capillary: 258 mg/dL — ABNORMAL HIGH (ref 70–99)
Glucose-Capillary: 152 mg/dL — ABNORMAL HIGH (ref 70–99)
Glucose-Capillary: 204 mg/dL — ABNORMAL HIGH (ref 70–99)
Glucose-Capillary: 184 mg/dL — ABNORMAL HIGH (ref 70–99)

## 2024-03-03 LAB — BASIC METABOLIC PANEL WITH GFR
Anion gap: 10 (ref 5–15)
BUN: 35 mg/dL — ABNORMAL HIGH (ref 6–20)
CO2: 30 mmol/L (ref 22–32)
Calcium: 8.5 mg/dL — ABNORMAL LOW (ref 8.9–10.3)
Chloride: 91 mmol/L — ABNORMAL LOW (ref 98–111)
Creatinine, Ser: 1.59 mg/dL — ABNORMAL HIGH (ref 0.61–1.24)
GFR, Estimated: 50 mL/min — ABNORMAL LOW (ref >60)
Glucose, Bld: 304 mg/dL — ABNORMAL HIGH (ref 70–99)
Potassium: 3.6 mmol/L (ref 3.5–5.1)
Sodium: 131 mmol/L — ABNORMAL LOW (ref 135–145)

## 2024-03-03 LAB — COOXEMETRY PANEL
Carboxyhemoglobin: 1.7 % — ABNORMAL HIGH (ref 0.5–1.5)
Methemoglobin: <0.7 % (ref 0.0–1.5)
O2 Saturation: 66.4 %
Total hemoglobin: 15.2 g/dL (ref 12.0–16.0)

## 2024-03-03 LAB — CALCIUM, IONIZED: Calcium, Ionized, Serum: 4.4 mg/dL — ABNORMAL LOW (ref 4.5–5.6)

## 2024-03-03 LAB — MAGNESIUM: Magnesium: 1.7 mg/dL (ref 1.7–2.4)

## 2024-03-03 LAB — UREA NITROGEN, URINE: Urea Nitrogen, Ur: 177 mg/dL

## 2024-03-03 MED ORDER — POTASSIUM CHLORIDE CRYS ER 20 MEQ PO TBCR
40.0000 meq | EXTENDED_RELEASE_TABLET | Freq: Once | ORAL | Status: AC
  Administered 2024-03-03: 40 meq via ORAL
  Filled 2024-03-03: qty 2

## 2024-03-03 MED ORDER — PROPOFOL 10 MG/ML IV BOLUS
100.0000 mg | Freq: Once | INTRAVENOUS | Status: AC | PRN
Start: 2024-03-03 — End: 2024-03-03
  Administered 2024-03-03: 100 mg via INTRAVENOUS
  Filled 2024-03-03: qty 20

## 2024-03-03 MED ORDER — FUROSEMIDE 10 MG/ML IJ SOLN
80.0000 mg | Freq: Two times a day (BID) | INTRAMUSCULAR | Status: AC
  Administered 2024-03-03 – 2024-03-04 (×2): 80 mg via INTRAVENOUS
  Filled 2024-03-03 (×2): qty 8

## 2024-03-03 MED ORDER — FENTANYL CITRATE PF 50 MCG/ML IJ SOSY
100.0000 ug | PREFILLED_SYRINGE | Freq: Once | INTRAMUSCULAR | Status: AC | PRN
  Administered 2024-03-03: 100 ug via INTRAVENOUS
  Filled 2024-03-03: qty 2

## 2024-03-03 NOTE — Progress Notes (Addendum)
 Advanced Heart Failure Rounding Note  Cardiologist: [name], MD   Chief Complaint: acute on chronic systolic heart failure  Subjective:    Feels much better today w/ diuresis. No current dyspnea. Continues in AFL 130s. Amio gtt 30.   On Milrinone 0.25 + NE 4. Co-ox 66%. CVP 5.   SCr improving w/ diuresis, 2.21>>1.84>>1.59 today    Objective:   Weight Range: 103 kg Body mass index is 33.54 kg/m.   Vital Signs:   Temp:  [97.7 F (36.5 C)-98.5 F (36.9 C)] 98.1 F (36.7 C) (03/31 0758) Pulse Rate:  [77-143] 82 (03/31 1115) Resp:  [16-34] 21 (03/31 1115) BP: (67-138)/(45-96) 107/74 (03/31 1115) SpO2:  [85 %-99 %] 97 % (03/31 1115) Weight:  [103 kg] 103 kg (03/31 0610) Last BM Date : 03/03/24  Weight change: Filed Weights   03/01/24 1700 03/02/24 0500 03/03/24 0610  Weight: 110.1 kg 112 kg 103 kg    Intake/Output:   Intake/Output Summary (Last 24 hours) at 03/03/2024 1136 Last data filed at 03/03/2024 1100 Gross per 24 hour  Intake 2091.12 ml  Output 9135 ml  Net -7043.88 ml      Physical Exam   CVP 5  General:  Well appearing. No respiratory difficulty HEENT: normal Neck: supple. JVD 7-8 cm. Carotids 2+ bilat; no bruits. No lymphadenopathy or thyromegaly appreciated. Cor: PMI nondisplaced. Irregular rhythm and rate. No rubs, gallops or murmurs. Lungs: clear Abdomen: soft, nontender, nondistended. No hepatosplenomegaly. No bruits or masses. Good bowel sounds. Extremities: no cyanosis, clubbing, rash, edema, RUE PICC  Neuro: alert & oriented x 3, cranial nerves grossly intact. moves all 4 extremities w/o difficulty. Affect pleasant.   Labs    CBC Recent Labs    03/01/24 2138 03/02/24 0349  WBC 8.5 10.4  HGB 15.9 14.8  HCT 48.3 45.4  MCV 100.4* 99.6  PLT 194 216   Basic Metabolic Panel Recent Labs    16/10/96 2138 03/02/24 1530 03/03/24 0458  NA 131* 128* 131*  K 4.6 3.7 3.6  CL 98 90* 91*  CO2 23 26 30   GLUCOSE 122* 303*  304*  BUN 28* 34* 35*  CREATININE 2.21* 1.84* 1.59*  CALCIUM 8.4* 8.8* 8.5*  MG 2.2 1.9 1.7  PHOS 5.1*  --   --    Liver Function Tests No results for input(s): "AST", "ALT", "ALKPHOS", "BILITOT", "PROT", "ALBUMIN" in the last 72 hours. No results for input(s): "LIPASE", "AMYLASE" in the last 72 hours. Cardiac Enzymes No results for input(s): "CKTOTAL", "CKMB", "CKMBINDEX", "TROPONINI" in the last 72 hours.  BNP: BNP (last 3 results) Recent Labs    03/01/24 0544  BNP 1,074.8*    ProBNP (last 3 results) No results for input(s): "PROBNP" in the last 8760 hours.   D-Dimer No results for input(s): "DDIMER" in the last 72 hours. Hemoglobin A1C Recent Labs    03/01/24 1037  HGBA1C 8.9*   Fasting Lipid Panel Recent Labs    03/02/24 1507  CHOL 70  HDL 30*  LDLCALC 25  TRIG 75  CHOLHDL 2.3   Thyroid Function Tests No results for input(s): "TSH", "T4TOTAL", "T3FREE", "THYROIDAB" in the last 72 hours.  Invalid input(s): "FREET3"  Other results:    Medications:     Scheduled Medications:  apixaban  5 mg Oral BID   atorvastatin  80 mg Oral q1800   Chlorhexidine Gluconate Cloth  6 each Topical Daily   clopidogrel  75 mg Oral Daily   diazepam  5 mg Oral  QHS   ezetimibe  10 mg Oral Daily   furosemide  80 mg Intravenous BID   insulin aspart  0-20 Units Subcutaneous TID WC   insulin aspart  0-5 Units Subcutaneous QHS   insulin aspart  10 Units Subcutaneous TID WC   insulin glargine-yfgn  35 Units Subcutaneous BID   loratadine  10 mg Oral Daily   nicotine  21 mg Transdermal Daily   pantoprazole  40 mg Oral Daily   sodium chloride flush  10-40 mL Intracatheter Q12H    Infusions:  amiodarone 30 mg/hr (03/03/24 1100)   milrinone 0.125 mcg/kg/min (03/03/24 1100)   norepinephrine (LEVOPHED) Adult infusion 4 mcg/min (03/03/24 1100)    PRN Medications: acetaminophen **OR** acetaminophen, albuterol, cyclobenzaprine, nitroGLYCERIN, ondansetron **OR** ondansetron  (ZOFRAN) IV, oxyCODONE-acetaminophen **AND** oxyCODONE, sodium chloride flush    Patient Profile   Patient is a 58 year old male with significant atherosclerotic disease including multiple PCI's, CABG in 2021, PAD, ongoing tobacco abuse, paroxysmal atrial fibrillation, and chronic systolic heart failure who presents with acute on chronic systolic heart failure in the setting of new atrial flutter.  He decompensated on the floor and went into cardiogenic shock requiring inotropes and vasopressors.  Assessment/Plan    Cardiogenic shock Acute on chronic systolic heart failure - Poor urine output on the floor with associated hypotension, elevated lactic acid - Placed on IV milrinone/norepi with improvement - Decompensation likely in the setting of atrial flutter - Remains on Milrinone 0.25 + NE 4. Co-ox 66%. Improved diureses w/ support CVP trending down - Continue IV Lasix 80 mg x 1 today  - reduce milrinone to 0.125 ahead of cardioversion attempt  - holding GDMT w/ AKI and continued need for NE support   CAD - Prior CABG in 2024.  - EF decreased to 25-30% in the setting of shock/aflutter. Suspect limited utility to LHC at the moment, especially with AKI  Atrial flutter/fibrillation:  - continues in persistent AFL w/ RVR. Plan DCCV today  - Continue IV amiodarone until off inotropic/pressor support - Continue apixaban 5mg  BID, no recent missed doses in the last 3 weeks. No indication for TEE - outpatient referral to EP for ablation   PAD:  - Continue apixaban/statin  HLD:  - Continue atorvastatin/zetia - Repeat lipid panel, consider follow up with lipid clinic  AKI  - in the setting of cardiogenic shock  - improving w/ support, SCr 2.21>>1.59 today  - follow BMP   Length of Stay: 2  Robbie Lis, PA-C  03/03/2024, 11:36 AM  Advanced Heart Failure Team Pager [phone] (M-F; 7a - 5p)  Please contact [facility] for night-coverage after hours (5p -7a ) and weekends  on [email or web address]

## 2024-03-03 NOTE — Inpatient Diabetes Management (Addendum)
 Inpatient Diabetes Program Recommendations  AACE/ADA: New Consensus Statement on Inpatient Glycemic Control (2015)  Target Ranges:  Prepandial:   less than 140 mg/dL      Peak postprandial:   less than 180 mg/dL (1-2 hours)      Critically ill patients:  140 - 180 mg/dL   Lab Results  Component Value Date   GLUCAP 258 (H) 03/03/2024   HGBA1C 8.9 (H) 03/01/2024    Review of Glycemic Control  Latest Reference Range & Units 03/02/24 15:47 03/02/24 19:16 03/02/24 20:48 03/03/24 06:09 03/03/24 08:26  Glucose-Capillary 70 - 99 mg/dL 409 (H) 811 (H) 914 (H) 255 (H) 258 (H)  (H): Data is abnormally high Diabetes history: Type 2 DM Outpatient Diabetes medications: Farxiga 10 mg every day, Lantus 50 units BID, Humalog 5-15 units TID Current orders for Inpatient glycemic control: Semglee 35 units BID, Novolog 10 units TID, Novolog 0-20 units TID & HS  Inpatient Diabetes Program Recommendations:    Consider increasing Semglee to 40 units BID.  Thanks, Lujean Rave, MSN, RNC-OB Diabetes Coordinator [phone] (8a-5p)

## 2024-03-03 NOTE — Progress Notes (Signed)
 Heart Failure Navigator Progress Note  Assessed for Heart & Vascular TOC clinic readiness.  Patient does not meet criteria due to Advanced Heart Failure Team patient of Dr. Elwyn Lade. .   Navigator will sign off at this time.    Rhae Hammock, BSN, Scientist, clinical (histocompatibility and immunogenetics) Only

## 2024-03-03 NOTE — Procedures (Signed)
   DIRECT CURRENT CARDIOVERSION  NAME:  Ryan [name]    MRN: [id number] DOB:  [birth year]    ADMIT DATE: 03/01/2024  INDICATIONS: Symptomatic atrial flutter  PROCEDURE: CARDIOVERSION:     Indications:  Symptomatic Atrial Flutter  Procedure Details:  The patient had the defibrillator pads placed in the anterior and posterior position. Once an appropriate level of sedation was confirmed, the patient was cardioverted x 1 with 200J of biphasic synchronized energy.  The patient converted to NSR.  There were no apparent complications.  The patient had normal neuro status and respiratory status post procedure with vitals stable as recorded elsewhere.  Adequate airway was maintained throughout and vital signs monitored per protocol. Sedation by CCM.   Ryan [name] Advanced Heart Failure 7:46 PM

## 2024-03-03 NOTE — Progress Notes (Signed)
 03/03/2024 Consent obtained fent and 100mg  prop given for cardioversion BP/resp status tolerated well  Myrla Halsted MD PCCM

## 2024-03-03 NOTE — TOC Initial Note (Signed)
 Transition of Care [facility]) - Initial/Assessment Note    Patient Details  Name: Ryan [name] MRN: [id number] Date of Birth: 16-Nov-1966  Transition of [facility]) CM/SW Contact:    Elliot Cousin, RN Phone Number:[phone] 03/03/2024, 12:55 PM  Clinical Narrative:                  TOC CM spoke to pt at bedside, states he was independent pta. Lives with Karilyn Cota. Gave permission to speak to SO. Will continue to follow for dc needs.    Expected Discharge Plan: Home/Self Care Barriers to Discharge: Continued Medical Work up   Patient Goals and CMS Choice Patient states their goals for this hospitalization and ongoing recovery are:: wants to get better          Expected Discharge Plan and Services   Discharge Planning Services: CM Consult   Living arrangements for the past 2 months: Single Family Home                                      Prior Living Arrangements/Services Living arrangements for the past 2 months: Single Family Home Lives with:: Significant Other Patient language and need for interpreter reviewed:: Yes Do you feel safe going back to the place where you live?: Yes      Need for Family Participation in Patient Care: No (Comment) Care giver support system in place?: Yes (comment)   Criminal Activity/Legal Involvement Pertinent to Current Situation/Hospitalization: No - Comment as needed  Activities of Daily Living   ADL Screening (condition at time of admission) Independently performs ADLs?: Yes (appropriate for developmental age) Is the patient deaf or have difficulty hearing?: No Does the patient have difficulty seeing, even when wearing glasses/contacts?: No Does the patient have difficulty concentrating, remembering, or making decisions?: No  Permission Sought/Granted Permission sought to share information with : Case Manager, Family Supports, PCP Permission granted to share information with : Yes, Verbal Permission Granted  Share  Information with NAME: Lysbeth Penner     Permission granted to share info w Relationship: SO  Permission granted to share info w Contact Information: [phone]  Emotional Assessment Appearance:: Appears stated age Attitude/Demeanor/Rapport: Engaged Affect (typically observed): Accepting Orientation: : Oriented to Self, Oriented to Place, Oriented to  Time, Oriented to Situation   Psych Involvement: No (comment)  Admission diagnosis:  Atrial flutter with rapid ventricular response (HCC) [I48.92] Heart failure (HCC) [I50.9] Acute on chronic combined systolic and diastolic CHF (congestive heart failure) (HCC) [I50.43] Patient Active Problem List   Diagnosis Date Noted   Acute on chronic systolic CHF (congestive heart failure) (HCC) 03/01/2024   Chronic kidney disease, stage 3a (HCC) 03/01/2024   Obesity, class 1 03/01/2024   Heart failure (HCC) 03/01/2024   NSTEMI (non-ST elevation myocardial infarction) (HCC) 03/26/2023   HFrEF (heart failure with reduced ejection fraction) (HCC) 03/26/2023   Left ventricular apical thrombus 03/26/2023   Pure hypercholesterolemia 03/26/2023   Claudication in peripheral vascular disease (HCC) 01/26/2023   Paroxysmal atrial fibrillation (HCC) 03/31/2022   Acute combined systolic and diastolic heart failure (HCC) 03/29/2022   S/P CABG x 3 05/05/2020   Unstable angina (HCC)    Atherosclerosis of native artery of both lower extremities with intermittent claudication (HCC) 04/01/2018   COPD (chronic obstructive pulmonary disease) (HCC) 07/31/2017   Hyponatremia 07/31/2017   Non-compliance 07/31/2017   ST elevation myocardial infarction involving left anterior  descending (LAD) coronary artery (HCC) 07/31/2017   Transaminitis 07/31/2017   Uncontrolled diabetes mellitus 10/23/2016   Essential hypertension 10/23/2016   Subclavian steal syndrome 08/11/2013   PVD (peripheral vascular disease) (HCC) 03/31/2013   Numbness and tingling of right arm 03/31/2013    Pain in joint, upper arm 03/31/2013   Peripheral vascular disease, unspecified (HCC) 12/16/2012   Occlusion and stenosis of carotid artery without mention of cerebral infarction 08/26/2012   Chronic total occlusion of artery of the extremities (HCC) 02/26/2012   PAD (peripheral artery disease) (HCC) 01/24/2012   Ischemic cardiomyopathy 01/24/2012   Cardiomyopathy, secondary (HCC) 12/26/2011   Claudication (HCC) 12/26/2011   Type 2 diabetes mellitus with hyperlipidemia (HCC) 12/14/2011   Acute myocardial infarction of other anterior wall, subsequent episode of care 12/12/2011   Tobacco abuse    CAD (coronary artery disease)    PCP:  Fleet Contras, MD Pharmacy:   [facility] - [place], [place] - [address] [place] [address] Phone: [phone] Fax: [phone]     Social Drivers of Health (SDOH) Social History: SDOH Screenings   Food Insecurity: No Food Insecurity (03/01/2024)  Housing: Low Risk  (03/01/2024)  Transportation Needs: No Transportation Needs (03/01/2024)  Utilities: Not At Risk (03/01/2024)  Alcohol Screen: Low Risk  (03/30/2022)  Financial Resource Strain: Low Risk  (03/30/2022)  Tobacco Use: High Risk (03/01/2024)   SDOH Interventions:     Readmission Risk Interventions     No data to display

## 2024-03-04 DIAGNOSIS — I4892 Unspecified atrial flutter: Secondary | ICD-10-CM | POA: Diagnosis not present

## 2024-03-04 DIAGNOSIS — I5023 Acute on chronic systolic (congestive) heart failure: Secondary | ICD-10-CM | POA: Diagnosis not present

## 2024-03-04 LAB — BASIC METABOLIC PANEL WITH GFR
Anion gap: 10 (ref 5–15)
BUN: 32 mg/dL — ABNORMAL HIGH (ref 6–20)
CO2: 31 mmol/L (ref 22–32)
Calcium: 8.5 mg/dL — ABNORMAL LOW (ref 8.9–10.3)
Chloride: 91 mmol/L — ABNORMAL LOW (ref 98–111)
Creatinine, Ser: 1.26 mg/dL — ABNORMAL HIGH (ref 0.61–1.24)
GFR, Estimated: >60 mL/min (ref >60)
Glucose, Bld: 194 mg/dL — ABNORMAL HIGH (ref 70–99)
Potassium: 3.9 mmol/L (ref 3.5–5.1)
Sodium: 132 mmol/L — ABNORMAL LOW (ref 135–145)

## 2024-03-04 LAB — COOXEMETRY PANEL
Carboxyhemoglobin: 1.6 % — ABNORMAL HIGH (ref 0.5–1.5)
Carboxyhemoglobin: 0.9 % (ref 0.5–1.5)
Methemoglobin: <0.7 % (ref 0.0–1.5)
Methemoglobin: <0.7 % (ref 0.0–1.5)
O2 Saturation: 53.5 %
O2 Saturation: 40 %
Total hemoglobin: 14.7 g/dL (ref 12.0–16.0)
Total hemoglobin: 14.7 g/dL (ref 12.0–16.0)

## 2024-03-04 LAB — MAGNESIUM: Magnesium: 1.7 mg/dL (ref 1.7–2.4)

## 2024-03-04 LAB — GLUCOSE, CAPILLARY
Glucose-Capillary: 129 mg/dL — ABNORMAL HIGH (ref 70–99)
Glucose-Capillary: 245 mg/dL — ABNORMAL HIGH (ref 70–99)
Glucose-Capillary: 178 mg/dL — ABNORMAL HIGH (ref 70–99)
Glucose-Capillary: 192 mg/dL — ABNORMAL HIGH (ref 70–99)

## 2024-03-04 MED ORDER — POTASSIUM CHLORIDE CRYS ER 20 MEQ PO TBCR
20.0000 meq | EXTENDED_RELEASE_TABLET | Freq: Once | ORAL | Status: AC
  Administered 2024-03-04: 20 meq via ORAL
  Filled 2024-03-04: qty 1

## 2024-03-04 MED ORDER — FUROSEMIDE 10 MG/ML IJ SOLN
80.0000 mg | Freq: Two times a day (BID) | INTRAMUSCULAR | Status: DC
  Filled 2024-03-04: qty 8

## 2024-03-04 MED ORDER — ACETAZOLAMIDE 250 MG PO TABS
500.0000 mg | ORAL_TABLET | Freq: Once | ORAL | Status: AC
  Administered 2024-03-04: 500 mg via ORAL
  Filled 2024-03-04: qty 2

## 2024-03-04 MED ORDER — DIGOXIN 125 MCG PO TABS
0.1250 mg | ORAL_TABLET | Freq: Every day | ORAL | Status: DC
  Administered 2024-03-04 – 2024-03-06 (×3): 0.125 mg via ORAL
  Filled 2024-03-04 (×3): qty 1

## 2024-03-04 MED ORDER — FUROSEMIDE 10 MG/ML IJ SOLN
80.0000 mg | Freq: Once | INTRAMUSCULAR | Status: AC
  Administered 2024-03-04: 80 mg via INTRAVENOUS
  Filled 2024-03-04: qty 8

## 2024-03-04 MED ORDER — AMIODARONE HCL 200 MG PO TABS
400.0000 mg | ORAL_TABLET | Freq: Two times a day (BID) | ORAL | Status: DC
  Administered 2024-03-04 – 2024-03-06 (×5): 400 mg via ORAL
  Filled 2024-03-04 (×5): qty 2

## 2024-03-04 MED ORDER — MAGNESIUM SULFATE 2 GM/50ML IV SOLN
2.0000 g | Freq: Once | INTRAVENOUS | Status: AC
  Administered 2024-03-04: 2 g via INTRAVENOUS
  Filled 2024-03-04: qty 50

## 2024-03-04 NOTE — Discharge Summary (Signed)
 Advanced Heart Failure Team  Discharge Summary   Patient ID: Ryan [name] MRN: [id number], DOB/AGE: 1966/02/01 58 y.o. Admit date: 03/01/2024 D/C date:     03/06/2024   Primary Discharge Diagnoses:  Acute on Chronic Systolic Heart Failure>>Cardiogenic Shock AFL w/ RVR s/p DCCV   Secondary Discharge Diagnoses:  CAD h/o CAGB w/o Angina PAD HLD  Hospital Course:   58 y/o male w/ h/o chronic systolic heart failure due to ischemic CM, most recent EF 35-40% from echo 06/2023, CAD s/p CABG in 2021, h/o LV thrombus on chronic a/c therapy w/ Eliquis, severe PAD, left carotid to subclavian bypass 04/2023, right femoral to popliteal bypass, CKD and Type 2DM, admitted w/ acute on chronic systolic heart failure and new AFL w/ RVR of unknown duration. He was initially admitted to the PCU and started on amiodarone gtt and IV Lasix. Eliquis was continued and he denied missing any doses in the last 4 wks. Echo showed further drop in EF, down to 25-30%, RV was suboptimally visualized but appeared moderately reduced. He denied CP and HS trop trend (25>>32>>29) was not c/w ACS. Drop in EF was felt to be tachymediated from rapid AFL.   While on the floor, he developed acute cardiogenic shock w/ hypotension and drop in UOP. He was transferred to the CCU and started on Milrinone and NE support. PICC was placed to monitor co-ox and CVPs. Initial co-ox was 67% (already on Milrinone and NE). IV Lasix was increased to 80 mg bid. Continued on amio gtt. He responded well to therapies w/ significant improvement in UOP and volume status. BP stabilized and able to wean off NE. Milrinone titrated down. He remained in persistent AFL and underwent successful DCCV back to NSR on 3/30. He maintained SR and was transitioned to PO amio 400 mg bid. He was diuresed further w/ improvement in volume status and milrinone weaned off. Transitioned to PO torsemide 40 mg qam/20mg  qpm and oral GDMT.   On 4/3, he was last seen and examined by Dr.  Gasper Lloyd and felt stable for d/c home. He has close clinic f/u arranged in 1 wk w/ Dr. Elwyn Lade.     Discharge Weight Range: 213 lb  Discharge Vitals: Blood pressure 123/71, pulse 72, temperature 98.1 F (36.7 C), temperature source Oral, resp. rate 16, height 5\' 9"  (1.753 m), weight 97 kg, SpO2 92%.  PHYSICAL EXAM: General:  Well appearing. No respiratory difficulty HEENT: normal Neck: supple. JVD not elevated. Carotids 2+ bilat; no bruits. No lymphadenopathy or thyromegaly appreciated. Cor: PMI nondisplaced. Regular rate & rhythm. No rubs, gallops or murmurs. Lungs: clear Abdomen: obese, soft, nontender, nondistended. Extremities: no cyanosis, clubbing, rash, edema Neuro: alert & oriented x 3, cranial nerves grossly intact. moves all 4 extremities w/o difficulty. Affect pleasant.   Labs: Lab Results  Component Value Date   WBC 10.4 03/02/2024   HGB 14.8 03/02/2024   HCT 45.4 03/02/2024   MCV 99.6 03/02/2024   PLT 216 03/02/2024    Recent Labs  Lab 03/06/24 0510  NA 134*  K 3.3*  CL 93*  CO2 31  BUN 28*  CREATININE 1.38*  CALCIUM 9.3  GLUCOSE 108*   Lab Results  Component Value Date   CHOL 70 03/02/2024   HDL 30 (L) 03/02/2024   LDLCALC 25 03/02/2024   TRIG 75 03/02/2024   BNP (last 3 results) Recent Labs    03/01/24 0544  BNP 1,074.8*    ProBNP (last 3 results) No results for input(s): "PROBNP" in the  last 8760 hours.   Diagnostic Studies/Procedures   03/02/24 1. No left ventricular thrombus is seen (Definity contrast was used).  Left ventricular ejection fraction, by estimation, is 25 to 30%. The left  ventricle has severely decreased function. The left ventricle demonstrates  regional wall motion abnormalities   (see scoring diagram/findings for description). The left ventricular  internal cavity size was mildly dilated. Left ventricular diastolic  function could not be evaluated. There is akinesis of the left  ventricular, mid-apical anteroseptal  wall, anterior  wall and apical segment. There is severe hypokinesis of the left  ventricular, entire inferior wall.   2. The right ventricle is suboptimally visualized. Right ventricular  systolic function is moderately reduced. The right ventricular size is  mildly enlarged. There is mildly elevated pulmonary artery systolic  pressure. The estimated right ventricular  systolic pressure is 40.4 mmHg.   3. Left atrial size was moderately dilated.   4. Right atrial size was moderately dilated.   5. The mitral valve is normal in structure. Mild mitral valve  regurgitation. No evidence of mitral stenosis.   6. Tricuspid valve regurgitation is mild to moderate.   7. The aortic valve is tricuspid. There is mild calcification of the  aortic valve. There is mild thickening of the aortic valve. Aortic valve  regurgitation is not visualized. Aortic valve sclerosis/calcification is  present, without any evidence of  aortic stenosis.   8. The inferior vena cava is dilated in size with <50% respiratory  variability, suggesting right atrial pressure of 15 mmHg.    Discharge Medications   Allergies as of 03/06/2024       Reactions   Metformin Anaphylaxis   Water blisters between fingers   Hydrocodone Hives, Nausea And Vomiting        Medication List     STOP taking these medications    carvedilol 6.25 MG tablet Commonly known as: COREG   Entresto 24-26 MG Generic drug: sacubitril-valsartan   Farxiga 10 MG Tabs tablet Generic drug: dapagliflozin propanediol   furosemide 40 MG tablet Commonly known as: LASIX   naloxone 4 MG/0.1ML Liqd nasal spray kit Commonly known as: NARCAN   pantoprazole 40 MG tablet Commonly known as: PROTONIX       TAKE these medications    albuterol 108 (90 Base) MCG/ACT inhaler Commonly known as: VENTOLIN HFA Inhale 1-2 puffs into the lungs every 6 (six) hours as needed for wheezing or shortness of breath.   amiodarone 400 MG tablet Commonly  known as: PACERONE Take 1 tablet (400 mg total) by mouth 2 (two) times daily.   aspirin EC 81 MG tablet Take 1 tablet (81 mg total) by mouth daily.   atorvastatin 80 MG tablet Commonly known as: LIPITOR Take 80 mg by mouth daily at 6 PM.   cetirizine 10 MG tablet Commonly known as: ZYRTEC Take 10 mg by mouth daily as needed for allergies.   clopidogrel 75 MG tablet Commonly known as: PLAVIX Take 1 tablet (75 mg total) by mouth daily.   cyclobenzaprine 10 MG tablet Commonly known as: FLEXERIL Take 1 tablet (10 mg total) by mouth 2 (two) times daily as needed for muscle spasms.   diazepam 10 MG tablet Commonly known as: VALIUM Take 10 mg by mouth at bedtime.   digoxin 0.125 MG tablet Commonly known as: LANOXIN Take 1 tablet (0.125 mg total) by mouth daily. Start taking on: March 07, 2024   Eliquis 5 MG Tabs tablet Generic drug: apixaban TAKE 1 TABLET  BY MOUTH 2 TIMES A DAY   ezetimibe 10 MG tablet Commonly known as: ZETIA Take 1 tablet (10 mg total) by mouth daily.   gabapentin 300 MG capsule Commonly known as: NEURONTIN Take 300 mg by mouth 3 (three) times daily.   insulin glargine 100 UNIT/ML Solostar Pen Commonly known as: LANTUS Inject 50 Units into the skin 2 (two) times daily.   insulin lispro 100 UNIT/ML injection Commonly known as: HUMALOG Inject 5-15 Units into the skin 3 (three) times daily as needed (blood sugar over 150).   nitroGLYCERIN 0.4 MG SL tablet Commonly known as: NITROSTAT Place 0.4 mg under the tongue every 5 (five) minutes as needed for chest pain.   oxyCODONE-acetaminophen 10-325 MG tablet Commonly known as: PERCOCET Take 1 tablet by mouth 4 (four) times daily.   potassium chloride SA 20 MEQ tablet Commonly known as: KLOR-CON M Take 1 tablet (20 mEq total) by mouth 2 (two) times daily.   Repatha SureClick 140 MG/ML Soaj Generic drug: Evolocumab Inject 140 mg into the skin every 14 (fourteen) days.   spironolactone 25 MG  tablet Commonly known as: ALDACTONE Take 1 tablet (25 mg total) by mouth every evening. What changed: when to take this   torsemide 20 MG tablet Commonly known as: DEMADEX Take 2 tablets (40 mg total) by mouth every morning AND 1 tablet (20 mg total) every evening.   Trintellix 20 MG Tabs tablet Generic drug: vortioxetine HBr Take 20 mg by mouth daily in the afternoon.   Vascepa 1 g capsule Generic drug: icosapent Ethyl Take 2 capsules (2 g total) by mouth 2 (two) times daily.        Disposition   The patient will be discharged in stable condition to home.   Follow-up Information     Romie Minus, MD Follow up.   Specialty: Cardiology Why: hospital follow-up in the Advanced Heart Failure Clinic at [facility], Entrance C. Valet Parking available. Contact information: [address] [place] [address] [phone]                   Duration of Discharge Encounter: Greater than 35 minutes   Signed, Robbie Lis, PA-C 03/06/2024, 10:21 AM

## 2024-03-04 NOTE — Plan of Care (Signed)
  Problem: Education: Goal: Ability to describe self-care measures that may prevent or decrease complications (Diabetes Survival Skills Education) will improve Outcome: Progressing   Problem: Nutritional: Goal: Maintenance of adequate nutrition will improve Outcome: Progressing   Problem: Skin Integrity: Goal: Risk for impaired skin integrity will decrease Outcome: Progressing   Problem: Tissue Perfusion: Goal: Adequacy of tissue perfusion will improve Outcome: Progressing

## 2024-03-04 NOTE — Progress Notes (Signed)
 Advanced Heart Failure Rounding Note  Cardiologist: [name], MD   Chief Complaint: acute on chronic systolic heart failure  Subjective:    S/p successful DCCV yesterday. Maintaining NSR on amio gtt, currently at 30/hr.   Continues on Milrinone 0.125, off NE. Co-ox 54% (repeat pending)  CVP elevated, 17 today.    SCr improving, 2.21>>1.84>>1.59>>1.26 today. K 3.9   Overall feels better. Denies dyspnea. No CP    Objective:   Weight Range: 101.9 kg Body mass index is 33.17 kg/m.   Vital Signs:   Temp:  [97.9 F (36.6 C)-98.8 F (37.1 C)] 98.7 F (37.1 C) (04/01 0827) Pulse Rate:  [79-99] 85 (04/01 0600) Resp:  [16-31] 28 (04/01 0600) BP: (67-123)/(45-94) 113/72 (04/01 0600) SpO2:  [90 %-100 %] 94 % (04/01 0600) Weight:  [101.9 kg] 101.9 kg (04/01 0600) Last BM Date : 03/03/24  Weight change: Filed Weights   03/02/24 0500 03/03/24 0610 03/04/24 0600  Weight: 112 kg 103 kg 101.9 kg    Intake/Output:   Intake/Output Summary (Last 24 hours) at 03/04/2024 1008 Last data filed at 03/04/2024 0800 Gross per 24 hour  Intake 1480.19 ml  Output 3175 ml  Net -1694.81 ml      Physical Exam   CVP 17  General:  fatigued appearing. No respiratory difficulty HEENT: normal Neck: supple. JVD elevated to jaw. Carotids 2+ bilat; no bruits. No lymphadenopathy or thyromegaly appreciated. Cor: PMI nondisplaced. Regular rate & rhythm. No rubs, gallops or murmurs. Lungs: clear Abdomen: soft, nontender, nondistended. No hepatosplenomegaly. No bruits or masses. Good bowel sounds. Extremities: no cyanosis, clubbing, rash, trace b/l edema Neuro: alert & oriented x 3, cranial nerves grossly intact. moves all 4 extremities w/o difficulty. Affect pleasant.   Labs    CBC Recent Labs    03/01/24 2138 03/02/24 0349  WBC 8.5 10.4  HGB 15.9 14.8  HCT 48.3 45.4  MCV 100.4* 99.6  PLT 194 216   Basic Metabolic Panel Recent Labs    16/10/96 2138 03/02/24 1530  03/03/24 0458 03/04/24 0432  NA 131*   < > 131* 132*  K 4.6   < > 3.6 3.9  CL 98   < > 91* 91*  CO2 23   < > 30 31  GLUCOSE 122*   < > 304* 194*  BUN 28*   < > 35* 32*  CREATININE 2.21*   < > 1.59* 1.26*  CALCIUM 8.4*   < > 8.5* 8.5*  MG 2.2   < > 1.7 1.7  PHOS 5.1*  --   --   --    < > = values in this interval not displayed.   Liver Function Tests No results for input(s): "AST", "ALT", "ALKPHOS", "BILITOT", "PROT", "ALBUMIN" in the last 72 hours. No results for input(s): "LIPASE", "AMYLASE" in the last 72 hours. Cardiac Enzymes No results for input(s): "CKTOTAL", "CKMB", "CKMBINDEX", "TROPONINI" in the last 72 hours.  BNP: BNP (last 3 results) Recent Labs    03/01/24 0544  BNP 1,074.8*    ProBNP (last 3 results) No results for input(s): "PROBNP" in the last 8760 hours.   D-Dimer No results for input(s): "DDIMER" in the last 72 hours. Hemoglobin A1C Recent Labs    03/01/24 1037  HGBA1C 8.9*   Fasting Lipid Panel Recent Labs    03/02/24 1507  CHOL 70  HDL 30*  LDLCALC 25  TRIG 75  CHOLHDL 2.3   Thyroid Function Tests No results for input(s): "TSH", "T4TOTAL", "T3FREE", "  THYROIDAB" in the last 72 hours.  Invalid input(s): "FREET3"  Other results:    Medications:     Scheduled Medications:  acetaZOLAMIDE  500 mg Oral Once   amiodarone  400 mg Oral BID   apixaban  5 mg Oral BID   atorvastatin  80 mg Oral q1800   Chlorhexidine Gluconate Cloth  6 each Topical Daily   clopidogrel  75 mg Oral Daily   diazepam  5 mg Oral QHS   ezetimibe  10 mg Oral Daily   furosemide  80 mg Intravenous BID   insulin aspart  0-20 Units Subcutaneous TID WC   insulin aspart  0-5 Units Subcutaneous QHS   insulin aspart  10 Units Subcutaneous TID WC   insulin glargine-yfgn  35 Units Subcutaneous BID   loratadine  10 mg Oral Daily   nicotine  21 mg Transdermal Daily   pantoprazole  40 mg Oral Daily   sodium chloride flush  10-40 mL Intracatheter Q12H     Infusions:  milrinone 0.125 mcg/kg/min (03/04/24 0856)   norepinephrine (LEVOPHED) Adult infusion Stopped (03/03/24 2239)    PRN Medications: acetaminophen **OR** acetaminophen, albuterol, cyclobenzaprine, nitroGLYCERIN, ondansetron **OR** ondansetron (ZOFRAN) IV, oxyCODONE-acetaminophen **AND** oxyCODONE, sodium chloride flush    Patient Profile   Patient is a 58 year old male with significant atherosclerotic disease including multiple PCI's, CABG in 2021, PAD, ongoing tobacco abuse, paroxysmal atrial fibrillation, and chronic systolic heart failure who presents with acute on chronic systolic heart failure in the setting of new atrial flutter.  He decompensated on the floor and went into cardiogenic shock requiring inotropes and vasopressors.  Assessment/Plan   Cardiogenic shock Acute on chronic systolic heart failure - Decompensation likely in the setting of atrial flutter, now stabilized on milrinone support. NE has been weaned off. S/p DCCV.  - Today he remains on milrinone 0.125. Co-ox 54%, repeat pending. CVP up at 17 - Give 80 mg IV Lasix BID + 500 mg of diamox x 1  - Continue milrinone 0.125  - add digoxin 0.125 mg  - BPs soft. Try to add spiro tomorrow   CAD - Prior CABG in 2024.  - EF decreased to 25-30% in the setting of shock/aflutter. Denies CP.   - if EF does not improve w/ maintenance of NSR, would recommend LHC down the road - continue statin - on Plavix. No ASA given need for Eliquis  - no ? blocker w/ low output   Atrial flutter/fibrillation:  - s/p successful DCCV 3/31, in NSR today  - transition to PO amio 400 mg bid  - Continue apixaban 5mg  BID - outpatient referral to EP for ablation   PAD:  - Continue apixaban/statin  HLD:  - Lipids at goal, LDL this admit 25 mg/dL  - Continue atorvastatin/Zetia  AKI  - in the setting of cardiogenic shock  - improving w/ support, SCr 2.21>>1.59>>1.26 today  - follow BMP   Plan to transfer out of CCU  today.   Length of Stay: [address] PA-C  03/04/2024, 10:08 AM  Advanced Heart Failure Team Pager [phone] (M-F; 7a - 5p)  Please contact [facility] for night-coverage after hours (5p -7a ) and weekends on [email or web address]

## 2024-03-05 DIAGNOSIS — I5023 Acute on chronic systolic (congestive) heart failure: Secondary | ICD-10-CM | POA: Diagnosis not present

## 2024-03-05 LAB — BASIC METABOLIC PANEL WITH GFR
Anion gap: 9 (ref 5–15)
Anion gap: 8 (ref 5–15)
BUN: 32 mg/dL — ABNORMAL HIGH (ref 6–20)
BUN: 24 mg/dL — ABNORMAL HIGH (ref 6–20)
CO2: 36 mmol/L — ABNORMAL HIGH (ref 22–32)
CO2: 27 mmol/L (ref 22–32)
Calcium: 8.6 mg/dL — ABNORMAL LOW (ref 8.9–10.3)
Calcium: 7.4 mg/dL — ABNORMAL LOW (ref 8.9–10.3)
Chloride: 86 mmol/L — ABNORMAL LOW (ref 98–111)
Chloride: 101 mmol/L (ref 98–111)
Creatinine, Ser: 1.32 mg/dL — ABNORMAL HIGH (ref 0.61–1.24)
Creatinine, Ser: 0.86 mg/dL (ref 0.61–1.24)
GFR, Estimated: >60 mL/min (ref >60)
GFR, Estimated: >60 mL/min (ref >60)
Glucose, Bld: 220 mg/dL — ABNORMAL HIGH (ref 70–99)
Glucose, Bld: 109 mg/dL — ABNORMAL HIGH (ref 70–99)
Potassium: 2.9 mmol/L — ABNORMAL LOW (ref 3.5–5.1)
Potassium: 2.4 mmol/L — CL (ref 3.5–5.1)
Sodium: 131 mmol/L — ABNORMAL LOW (ref 135–145)
Sodium: 136 mmol/L (ref 135–145)

## 2024-03-05 LAB — COOXEMETRY PANEL
Carboxyhemoglobin: 2.1 % — ABNORMAL HIGH (ref 0.5–1.5)
Carboxyhemoglobin: 2 % — ABNORMAL HIGH (ref 0.5–1.5)
Methemoglobin: <0.7 % (ref 0.0–1.5)
Methemoglobin: <0.7 % (ref 0.0–1.5)
O2 Saturation: 55.4 %
O2 Saturation: 63.1 %
Total hemoglobin: 15 g/dL (ref 12.0–16.0)
Total hemoglobin: 11.9 g/dL — ABNORMAL LOW (ref 12.0–16.0)

## 2024-03-05 LAB — GLUCOSE, CAPILLARY
Glucose-Capillary: 156 mg/dL — ABNORMAL HIGH (ref 70–99)
Glucose-Capillary: 180 mg/dL — ABNORMAL HIGH (ref 70–99)
Glucose-Capillary: 259 mg/dL — ABNORMAL HIGH (ref 70–99)
Glucose-Capillary: 104 mg/dL — ABNORMAL HIGH (ref 70–99)
Glucose-Capillary: 105 mg/dL — ABNORMAL HIGH (ref 70–99)

## 2024-03-05 LAB — MAGNESIUM: Magnesium: 2.1 mg/dL (ref 1.7–2.4)

## 2024-03-05 MED ORDER — SPIRONOLACTONE 12.5 MG HALF TABLET
12.5000 mg | ORAL_TABLET | Freq: Every evening | ORAL | Status: DC
  Administered 2024-03-05: 12.5 mg via ORAL
  Filled 2024-03-05: qty 1

## 2024-03-05 MED ORDER — POTASSIUM CHLORIDE CRYS ER 20 MEQ PO TBCR
40.0000 meq | EXTENDED_RELEASE_TABLET | ORAL | Status: AC
  Administered 2024-03-05 (×2): 40 meq via ORAL
  Filled 2024-03-05 (×2): qty 2

## 2024-03-05 MED ORDER — ORAL CARE MOUTH RINSE: 15.0000 mL | OROMUCOSAL | Status: DC | PRN

## 2024-03-05 MED ORDER — ACETAZOLAMIDE 250 MG PO TABS
500.0000 mg | ORAL_TABLET | Freq: Two times a day (BID) | ORAL | Status: DC
  Administered 2024-03-05: 500 mg via ORAL
  Filled 2024-03-05: qty 2

## 2024-03-05 MED ORDER — OXYCODONE-ACETAMINOPHEN 5-325 MG PO TABS
1.0000 | ORAL_TABLET | Freq: Four times a day (QID) | ORAL | Status: DC | PRN
  Administered 2024-03-05 – 2024-03-06 (×4): 1 via ORAL
  Filled 2024-03-05 (×3): qty 1

## 2024-03-05 MED ORDER — OXYCODONE HCL 5 MG PO TABS
5.0000 mg | ORAL_TABLET | Freq: Four times a day (QID) | ORAL | Status: DC | PRN
  Administered 2024-03-05 – 2024-03-06 (×4): 5 mg via ORAL
  Filled 2024-03-05 (×3): qty 1

## 2024-03-05 MED ORDER — FUROSEMIDE 10 MG/ML IJ SOLN
80.0000 mg | Freq: Two times a day (BID) | INTRAMUSCULAR | Status: DC
  Administered 2024-03-05 – 2024-03-06 (×3): 80 mg via INTRAVENOUS
  Filled 2024-03-05 (×3): qty 8

## 2024-03-05 MED ORDER — POTASSIUM CHLORIDE 10 MEQ/100ML IV SOLN
10.0000 meq | INTRAVENOUS | Status: AC
  Administered 2024-03-05 (×4): 10 meq via INTRAVENOUS
  Filled 2024-03-05 (×4): qty 100

## 2024-03-05 MED ORDER — POTASSIUM CHLORIDE CRYS ER 20 MEQ PO TBCR
40.0000 meq | EXTENDED_RELEASE_TABLET | Freq: Every day | ORAL | Status: DC
  Administered 2024-03-05 – 2024-03-06 (×2): 40 meq via ORAL
  Filled 2024-03-05 (×2): qty 2

## 2024-03-05 MED ORDER — METOLAZONE 5 MG PO TABS: 5.0000 mg | ORAL_TABLET | Freq: Once | ORAL | Status: DC

## 2024-03-05 MED ORDER — POTASSIUM CHLORIDE 10 MEQ/50ML IV SOLN
10.0000 meq | INTRAVENOUS | Status: AC
Start: 2024-03-05 — End: 2024-03-05
  Administered 2024-03-05 (×2): 10 meq via INTRAVENOUS
  Filled 2024-03-05 (×2): qty 50

## 2024-03-05 MED ORDER — METOLAZONE 5 MG PO TABS
5.0000 mg | ORAL_TABLET | Freq: Once | ORAL | Status: AC
  Administered 2024-03-05: 5 mg via ORAL
  Filled 2024-03-05: qty 1

## 2024-03-05 NOTE — Plan of Care (Signed)

## 2024-03-05 NOTE — Plan of Care (Signed)
  Problem: Coping: Goal: Ability to adjust to condition or change in health will improve Outcome: Progressing   Problem: Fluid Volume: Goal: Ability to maintain a balanced intake and output will improve Outcome: Progressing   Problem: Health Behavior/Discharge Planning: Goal: Ability to manage health-related needs will improve Outcome: Progressing   Problem: Metabolic: Goal: Ability to maintain appropriate glucose levels will improve Outcome: Progressing   Problem: Nutritional: Goal: Maintenance of adequate nutrition will improve Outcome: Progressing   Problem: Tissue Perfusion: Goal: Adequacy of tissue perfusion will improve Outcome: Progressing   Problem: Education: Goal: Knowledge of General Education information will improve Description: Including pain rating scale, medication(s)/side effects and non-pharmacologic comfort measures Outcome: Progressing   Problem: Health Behavior/Discharge Planning: Goal: Ability to manage health-related needs will improve Outcome: Progressing   Problem: Clinical Measurements: Goal: Ability to maintain clinical measurements within normal limits will improve Outcome: Progressing   Problem: Nutrition: Goal: Adequate nutrition will be maintained Outcome: Progressing   Problem: Pain Managment: Goal: General experience of comfort will improve and/or be controlled Outcome: Progressing   Problem: Safety: Goal: Ability to remain free from injury will improve Outcome: Progressing   Problem: Skin Integrity: Goal: Risk for impaired skin integrity will decrease Outcome: Progressing

## 2024-03-05 NOTE — Progress Notes (Signed)
 Pharmacy Electrolyte Replacement  Recent Labs:  Recent Labs    03/05/24 0359 03/05/24 1400  K 2.9* 2.4*  MG 2.1  --   CREATININE 1.32* 0.86    Low Critical Values (K </= 2.5, Phos </= 1, Mg </= 1) Present: K = 2.4  MD Contacted: HF team  Plan: KCL q4 x2 and KCL IV x4.  Ulyses Southward, PharmD, BCIDP, AAHIVP, CPP Infectious Disease Pharmacist 03/05/2024 5:04 PM

## 2024-03-05 NOTE — Progress Notes (Signed)
 Advanced Heart Failure Rounding Note  Cardiologist: [name], MD   Chief Complaint: acute on chronic systolic heart failure  Subjective:    S/p successful DCCV 3/31. Maintaining NSR  Continues on Milrinone 0.125. Co-ox 55%   Brisk diuresis yesterday, w/ 6L in UOP but remains volume up. CVP 16.   SCr improving, 2.21>>1.84>>1.59>>1.26>>1.32 today. K 2.9, Mg 2.1   Feels ok. Denies any resting dyspnea. Wants to get home to wife. She has cancer. Per his report, she is not doing well but he is agreeable to stay 1 more day.    Objective:   Weight Range: 101.3 kg Body mass index is 32.98 kg/m.   Vital Signs:   Temp:  [97.8 F (36.6 C)-98.7 F (37.1 C)] 98.6 F (37 C) (04/02 0338) Pulse Rate:  [74-100] 88 (04/02 1000) Resp:  [16-31] 18 (04/02 1000) BP: (92-120)/(52-90) 118/78 (04/02 1000) SpO2:  [90 %-98 %] 96 % (04/02 1000) Weight:  [101.3 kg] 101.3 kg (04/02 0336) Last BM Date : 03/05/24  Weight change: Filed Weights   03/03/24 0610 03/04/24 0600 03/05/24 0336  Weight: 103 kg 101.9 kg 101.3 kg    Intake/Output:   Intake/Output Summary (Last 24 hours) at 03/05/2024 1026 Last data filed at 03/05/2024 0937 Gross per 24 hour  Intake 710.11 ml  Output 5150 ml  Net -4439.89 ml      Physical Exam   CVP 16  General:  fatigued appearing. No respiratory difficulty HEENT: normal Neck: supple. JVD elevated to jaw Carotids 2+ bilat; no bruits. No lymphadenopathy or thyromegaly appreciated. Cor: PMI nondisplaced. Regular rate & rhythm. No rubs, gallops or murmurs. Lungs: clear Abdomen: soft, nontender, nondistended.  Extremities: no cyanosis, clubbing, rash, 1+ b/l LE edema + RUE PICC  Neuro: alert & oriented x 3, cranial nerves grossly intact. moves all 4 extremities w/o difficulty. Affect pleasant.   Labs    CBC No results for input(s): "WBC", "NEUTROABS", "HGB", "HCT", "MCV", "PLT" in the last 72 hours.  Basic Metabolic Panel Recent Labs     03/04/24 0432 03/05/24 0359  NA 132* 131*  K 3.9 2.9*  CL 91* 86*  CO2 31 36*  GLUCOSE 194* 220*  BUN 32* 32*  CREATININE 1.26* 1.32*  CALCIUM 8.5* 8.6*  MG 1.7 2.1   Liver Function Tests No results for input(s): "AST", "ALT", "ALKPHOS", "BILITOT", "PROT", "ALBUMIN" in the last 72 hours. No results for input(s): "LIPASE", "AMYLASE" in the last 72 hours. Cardiac Enzymes No results for input(s): "CKTOTAL", "CKMB", "CKMBINDEX", "TROPONINI" in the last 72 hours.  BNP: BNP (last 3 results) Recent Labs    03/01/24 0544  BNP 1,074.8*    ProBNP (last 3 results) No results for input(s): "PROBNP" in the last 8760 hours.   D-Dimer No results for input(s): "DDIMER" in the last 72 hours. Hemoglobin A1C No results for input(s): "HGBA1C" in the last 72 hours.  Fasting Lipid Panel Recent Labs    03/02/24 1507  CHOL 70  HDL 30*  LDLCALC 25  TRIG 75  CHOLHDL 2.3   Thyroid Function Tests No results for input(s): "TSH", "T4TOTAL", "T3FREE", "THYROIDAB" in the last 72 hours.  Invalid input(s): "FREET3"  Other results:    Medications:     Scheduled Medications:  amiodarone  400 mg Oral BID   apixaban  5 mg Oral BID   atorvastatin  80 mg Oral q1800   Chlorhexidine Gluconate Cloth  6 each Topical Daily   clopidogrel  75 mg Oral Daily  diazepam  5 mg Oral QHS   digoxin  0.125 mg Oral Daily   ezetimibe  10 mg Oral Daily   furosemide  80 mg Intravenous BID   insulin aspart  0-20 Units Subcutaneous TID WC   insulin aspart  0-5 Units Subcutaneous QHS   insulin aspart  10 Units Subcutaneous TID WC   insulin glargine-yfgn  35 Units Subcutaneous BID   loratadine  10 mg Oral Daily   metolazone  5 mg Oral Once   nicotine  21 mg Transdermal Daily   pantoprazole  40 mg Oral Daily   potassium chloride  40 mEq Oral Daily   potassium chloride  40 mEq Oral Q4H   sodium chloride flush  10-40 mL Intracatheter Q12H   spironolactone  12.5 mg Oral QPM    Infusions:  milrinone  0.125 mcg/kg/min (03/05/24 0800)   norepinephrine (LEVOPHED) Adult infusion Stopped (03/03/24 2239)    PRN Medications: acetaminophen **OR** acetaminophen, albuterol, cyclobenzaprine, nitroGLYCERIN, ondansetron **OR** ondansetron (ZOFRAN) IV, oxyCODONE-acetaminophen **AND** oxyCODONE, sodium chloride flush    Patient Profile   Patient is a 58 year old male with significant atherosclerotic disease including multiple PCI's, CABG in 2021, PAD, ongoing tobacco abuse, paroxysmal atrial fibrillation, and chronic systolic heart failure who presents with acute on chronic systolic heart failure in the setting of new atrial flutter.  He decompensated on the floor and went into cardiogenic shock requiring inotropes and vasopressors.  Assessment/Plan   Cardiogenic shock Acute on chronic systolic heart failure - Decompensation likely in the setting of atrial flutter, now stabilized on milrinone support. NE has been weaned off. S/p DCCV and maintaining NSR.  - Today he remains on milrinone 0.125. Co-ox 55%. CVP 16  - Push diuresis today, give IV Lasix 80 mg bid + 500 mg of diamox x 1 + metolazone 5 mg x 1 w/ aggressive K supp - Continue milrinone 0.125 through this morning. Plan to stop this evening.  - follow co-ox in AM  - continue digoxin 0.125 mg  - Add spiro 12.5 mg at bedtime  - place TED hoses   CAD - Prior CABG in 2024.  - EF decreased to 25-30% in the setting of shock/aflutter. Denies CP.   - if EF does not improve w/ maintenance of NSR, would recommend LHC down the road - continue statin - on Plavix. No ASA given need for Eliquis  - no ? blocker w/ low output   Atrial flutter/fibrillation:  - s/p successful DCCV 3/31, in NSR today  - transition to PO amio 400 mg bid  - Continue apixaban 5mg  BID - outpatient referral to EP for ablation   PAD:  - Continue apixaban/statin  HLD:  - Lipids at goal, LDL this admit 25 mg/dL  - Continue atorvastatin/Zetia  AKI  - in the setting of  cardiogenic shock  - improving w/ support, SCr 2.21>>1.32 today  - follow BMP   Hyokalemia - K 2.9 after aggressive diuresis yesterday - aggressive K supp. D/w pharmD - monitor closely while actively diuresing, obtain f/u BMP this afternoon - start spiro 12.mg daily   He has transfer orders for the floor. Will aim to get home tomorrow so that he can be w/ his wife.   Length of Stay: [address], PA-C  03/05/2024, 10:26 AM  Advanced Heart Failure Team Pager [phone] (M-F; 7a - 5p)  Please contact [facility] for night-coverage after hours (5p -7a ) and weekends on [email or web address]

## 2024-03-05 NOTE — TOC Progression Note (Signed)
 Transition of Care [facility] - Progression Note    Patient Details  Name: Ryan [name] MRN: [id number] Date of Birth: 09-01-1966  Transition of Care [facility]) CM/SW Contact  Nicanor Bake Phone Number: [phone] 03/05/2024, 10:36 AM  Clinical Narrative:   9:53 AM- HF CSW attempted to meet to follow up with patient at bedside. Patient was being seen by medical staff in the room. CSW will follow up at a more appropriate time.   TOC will continue following.     Expected Discharge Plan: Home/Self Care Barriers to Discharge: Continued Medical Work up  Expected Discharge Plan and Services   Discharge Planning Services: CM Consult   Living arrangements for the past 2 months: Single Family Home                                       Social Determinants of Health (SDOH) Interventions SDOH Screenings   Food Insecurity: No Food Insecurity (03/01/2024)  Housing: Low Risk  (03/01/2024)  Transportation Needs: No Transportation Needs (03/01/2024)  Utilities: Not At Risk (03/01/2024)  Alcohol Screen: Low Risk  (03/30/2022)  Financial Resource Strain: Low Risk  (03/30/2022)  Tobacco Use: High Risk (03/01/2024)    Readmission Risk Interventions     No data to display

## 2024-03-06 ENCOUNTER — Encounter (HOSPITAL_COMMUNITY): Payer: Self-pay | Admitting: Pharmacist Clinician (PhC)/ Clinical Pharmacy Specialist

## 2024-03-06 ENCOUNTER — Other Ambulatory Visit (HOSPITAL_COMMUNITY): Payer: Self-pay

## 2024-03-06 LAB — MAGNESIUM: Magnesium: 2.2 mg/dL (ref 1.7–2.4)

## 2024-03-06 LAB — BASIC METABOLIC PANEL WITH GFR
Anion gap: 10 (ref 5–15)
BUN: 28 mg/dL — ABNORMAL HIGH (ref 6–20)
CO2: 31 mmol/L (ref 22–32)
Calcium: 9.3 mg/dL (ref 8.9–10.3)
Chloride: 93 mmol/L — ABNORMAL LOW (ref 98–111)
Creatinine, Ser: 1.38 mg/dL — ABNORMAL HIGH (ref 0.61–1.24)
GFR, Estimated: 60 mL/min — ABNORMAL LOW (ref >60)
Glucose, Bld: 108 mg/dL — ABNORMAL HIGH (ref 70–99)
Potassium: 3.3 mmol/L — ABNORMAL LOW (ref 3.5–5.1)
Sodium: 134 mmol/L — ABNORMAL LOW (ref 135–145)

## 2024-03-06 LAB — GLUCOSE, CAPILLARY: Glucose-Capillary: 133 mg/dL — ABNORMAL HIGH (ref 70–99)

## 2024-03-06 LAB — DIGOXIN LEVEL: Digoxin Level: <0.2 ng/mL — ABNORMAL LOW (ref 0.8–2.0)

## 2024-03-06 MED ORDER — POTASSIUM CHLORIDE CRYS ER 20 MEQ PO TBCR
20.0000 meq | EXTENDED_RELEASE_TABLET | Freq: Two times a day (BID) | ORAL | 3 refills | Status: DC
  Filled 2024-03-06 – 2024-04-08 (×2): qty 60, 30d supply, fill #0

## 2024-03-06 MED ORDER — POTASSIUM CHLORIDE CRYS ER 20 MEQ PO TBCR
40.0000 meq | EXTENDED_RELEASE_TABLET | ORAL | Status: DC
  Administered 2024-03-06: 40 meq via ORAL
  Filled 2024-03-06: qty 2

## 2024-03-06 MED ORDER — AMIODARONE HCL 200 MG PO TABS
400.0000 mg | ORAL_TABLET | Freq: Two times a day (BID) | ORAL | 11 refills | Status: DC
  Filled 2024-03-06 – 2024-04-08 (×2): qty 120, 30d supply, fill #0

## 2024-03-06 MED ORDER — TORSEMIDE 20 MG PO TABS
ORAL_TABLET | ORAL | 11 refills | Status: DC
  Filled 2024-03-06: qty 90, 30d supply, fill #0

## 2024-03-06 MED ORDER — DIGOXIN 125 MCG PO TABS
0.1250 mg | ORAL_TABLET | Freq: Every day | ORAL | 11 refills | Status: DC
  Filled 2024-03-06 – 2024-04-08 (×2): qty 30, 30d supply, fill #0

## 2024-03-06 MED ORDER — SPIRONOLACTONE 25 MG PO TABS
25.0000 mg | ORAL_TABLET | Freq: Every evening | ORAL | 11 refills | Status: DC
  Filled 2024-03-06: qty 30, 30d supply, fill #0

## 2024-03-06 NOTE — Progress Notes (Signed)
 AVS printed and reviewed with patient   All questions answered.  TOC meds are bedside.  Has to complete bedrest time for after PICC line removal and will discharge to private automobile

## 2024-03-06 NOTE — TOC Transition Note (Signed)
 Transition of Care [facility]) - Discharge Note   Patient Details  Name: Ryan [name] MRN: [id number] Date of Birth: 1966/03/25  Transition of Care [facility]) CM/SW Contact:  Elliot Cousin, RN Phone Number: [phone] 03/06/2024, 1:14 PM   Clinical Narrative:     TOC CM spoke to pt and SO will provide transportation home.    Final next level of care: Home/Self Care Barriers to Discharge: No Barriers Identified   Patient Goals and CMS Choice Patient states their goals for this hospitalization and ongoing recovery are:: wants to get better          Discharge Placement                       Discharge Plan and Services Additional resources added to the After Visit Summary for     Discharge Planning Services: CM Consult                                 Social Drivers of Health (SDOH) Interventions SDOH Screenings   Food Insecurity: No Food Insecurity (03/01/2024)  Housing: Low Risk  (03/01/2024)  Transportation Needs: No Transportation Needs (03/01/2024)  Utilities: Not At Risk (03/01/2024)  Alcohol Screen: Low Risk  (03/30/2022)  Financial Resource Strain: Low Risk  (03/30/2022)  Tobacco Use: High Risk (03/01/2024)     Readmission Risk Interventions     No data to display

## 2024-03-06 NOTE — Plan of Care (Signed)
  Problem: Coping: Goal: Ability to adjust to condition or change in health will improve Outcome: Progressing   Problem: Fluid Volume: Goal: Ability to maintain a balanced intake and output will improve Outcome: Progressing   Problem: Metabolic: Goal: Ability to maintain appropriate glucose levels will improve Outcome: Progressing   Problem: Nutritional: Goal: Maintenance of adequate nutrition will improve Outcome: Progressing   Problem: Education: Goal: Knowledge of General Education information will improve Description: Including pain rating scale, medication(s)/side effects and non-pharmacologic comfort measures Outcome: Progressing   Problem: Health Behavior/Discharge Planning: Goal: Ability to manage health-related needs will improve Outcome: Progressing   Problem: Clinical Measurements: Goal: Ability to maintain clinical measurements within normal limits will improve Outcome: Progressing Goal: Will remain free from infection Outcome: Progressing Goal: Diagnostic test results will improve Outcome: Progressing Goal: Respiratory complications will improve Outcome: Progressing Goal: Cardiovascular complication will be avoided Outcome: Progressing   Problem: Activity: Goal: Risk for activity intolerance will decrease Outcome: Progressing   Problem: Elimination: Goal: Will not experience complications related to bowel motility Outcome: Progressing Goal: Will not experience complications related to urinary retention Outcome: Progressing   Problem: Pain Managment: Goal: General experience of comfort will improve and/or be controlled Outcome: Progressing   Problem: Safety: Goal: Ability to remain free from injury will improve Outcome: Progressing

## 2024-03-06 NOTE — Progress Notes (Signed)
 Patient provided with verbal discharge instructions. Paper copy of discharge provided to patient by d/c nurse. Answered all questions. Presciption provide to patient at bedside. PICC removed. Bed rest completed. VSS at discharge. Patient belongings sent with patient. Patient dc'd via wheelchair to private vehicle.

## 2024-03-07 ENCOUNTER — Other Ambulatory Visit: Payer: Self-pay | Admitting: Physician Assistant

## 2024-03-13 ENCOUNTER — Encounter (HOSPITAL_COMMUNITY): Payer: Self-pay | Admitting: Cardiology

## 2024-03-13 ENCOUNTER — Other Ambulatory Visit (HOSPITAL_COMMUNITY): Payer: Self-pay

## 2024-03-13 ENCOUNTER — Ambulatory Visit (HOSPITAL_COMMUNITY)
Admit: 2024-03-13 | Discharge: 2024-03-13 | Disposition: A | Discharge status: Home / Self Care | Attending: Cardiology | Admitting: Cardiology

## 2024-03-13 VITALS — BP 108/70 | HR 118 | Wt 213.8 lb

## 2024-03-13 DIAGNOSIS — E785 Hyperlipidemia, unspecified: Secondary | ICD-10-CM | POA: Insufficient documentation

## 2024-03-13 DIAGNOSIS — Z7901 Long term (current) use of anticoagulants: Secondary | ICD-10-CM | POA: Insufficient documentation

## 2024-03-13 DIAGNOSIS — J449 Chronic obstructive pulmonary disease, unspecified: Secondary | ICD-10-CM | POA: Diagnosis not present

## 2024-03-13 DIAGNOSIS — I739 Peripheral vascular disease, unspecified: Secondary | ICD-10-CM | POA: Diagnosis not present

## 2024-03-13 DIAGNOSIS — I2510 Atherosclerotic heart disease of native coronary artery without angina pectoris: Secondary | ICD-10-CM | POA: Insufficient documentation

## 2024-03-13 DIAGNOSIS — I255 Ischemic cardiomyopathy: Secondary | ICD-10-CM | POA: Insufficient documentation

## 2024-03-13 DIAGNOSIS — I779 Disorder of arteries and arterioles, unspecified: Secondary | ICD-10-CM | POA: Diagnosis not present

## 2024-03-13 DIAGNOSIS — I4439 Other atrioventricular block: Secondary | ICD-10-CM | POA: Diagnosis not present

## 2024-03-13 DIAGNOSIS — Z955 Presence of coronary angioplasty implant and graft: Secondary | ICD-10-CM | POA: Insufficient documentation

## 2024-03-13 DIAGNOSIS — R0683 Snoring: Secondary | ICD-10-CM | POA: Insufficient documentation

## 2024-03-13 DIAGNOSIS — F17200 Nicotine dependence, unspecified, uncomplicated: Secondary | ICD-10-CM | POA: Diagnosis not present

## 2024-03-13 DIAGNOSIS — I5022 Chronic systolic (congestive) heart failure: Secondary | ICD-10-CM | POA: Diagnosis not present

## 2024-03-13 DIAGNOSIS — I4892 Unspecified atrial flutter: Secondary | ICD-10-CM | POA: Diagnosis not present

## 2024-03-13 DIAGNOSIS — Z7902 Long term (current) use of antithrombotics/antiplatelets: Secondary | ICD-10-CM | POA: Insufficient documentation

## 2024-03-13 DIAGNOSIS — I4891 Unspecified atrial fibrillation: Secondary | ICD-10-CM | POA: Insufficient documentation

## 2024-03-13 DIAGNOSIS — Z951 Presence of aortocoronary bypass graft: Secondary | ICD-10-CM | POA: Diagnosis not present

## 2024-03-13 DIAGNOSIS — I480 Paroxysmal atrial fibrillation: Secondary | ICD-10-CM

## 2024-03-13 DIAGNOSIS — Z79899 Other long term (current) drug therapy: Secondary | ICD-10-CM | POA: Insufficient documentation

## 2024-03-13 LAB — BASIC METABOLIC PANEL WITH GFR
Anion gap: 13 (ref 5–15)
BUN: 21 mg/dL — ABNORMAL HIGH (ref 6–20)
CO2: 25 mmol/L (ref 22–32)
Calcium: 8.6 mg/dL — ABNORMAL LOW (ref 8.9–10.3)
Chloride: 96 mmol/L — ABNORMAL LOW (ref 98–111)
Creatinine, Ser: 1.27 mg/dL — ABNORMAL HIGH (ref 0.61–1.24)
GFR, Estimated: >60 mL/min (ref >60)
Glucose, Bld: 215 mg/dL — ABNORMAL HIGH (ref 70–99)
Potassium: 4.2 mmol/L (ref 3.5–5.1)
Sodium: 134 mmol/L — ABNORMAL LOW (ref 135–145)

## 2024-03-13 MED ORDER — TORSEMIDE 20 MG PO TABS
40.0000 mg | ORAL_TABLET | Freq: Two times a day (BID) | ORAL | 11 refills | Status: DC
Start: 2024-03-13 — End: 2024-06-23

## 2024-03-13 NOTE — H&P (View-Only) (Signed)
 ADVANCED HEART FAILURE FOLLOW UP CLINIC NOTE  Referring Physician: Fleet Contras, MD  Primary Care: Fleet Contras, MD Primary Cardiologist:  HPI: Ryan [name] is a 58 y.o. male with a PMH of CAD with multiple PCI and CABG, PAD, HLD, LV apical thrombus, carotid artery disease, tobacco use who presents for follow up of recent hospitalization for cardiogenic shock and afib.      Patient has had extensive history of atherosclerotic disease.  She is had CABG in 2021, multiple PCI including PCI to a vein graft to the RCA he also underwent aortobifem bypass in 2014, left carotid to subclavian bypass in 2014, right femoral to popliteal bypass and right femoral endarterectomy, angioplasty to the right CFA, last in 2024, and left femoral endarterectomy in 2024.     SUBJECTIVE:  Patient was recently discharged after admission for cardiogenic shock in the setting of atrial fibrillation.  He underwent cardioversion and required inotrope assisted diuresis.  He felt better, though is still significantly short of breath with mild to moderate exertion.  His volume status has improved and he does not have as much orthopnea as he did prior.  He does feel that he is having some palpitations.  He denies any dizziness or falls.  He denies any active chest pain.  He has never been tested for sleep apnea, but his daughter confirms that he snores quite a bit.  PMH, current medications, allergies, social history, and family history reviewed in epic.  PHYSICAL EXAM: Vitals:   03/13/24 1204  BP: 108/70  Pulse: (!) 118  SpO2: 92%   GENERAL: Chronically ill-appearing PULM:  Normal work of breathing, clear to auscultation bilaterally. Respirations are unlabored.  CARDIAC:  JVP: mildly elevated         Tachycardic with irregular rhythm, systolic murmur, 1+ LE edema. Warm and well perfused extremities. ABDOMEN: Soft, non-tender, mildly distended NEUROLOGIC: Patient is oriented x3 with no focal or lateralizing  neurologic deficits.    DATA REVIEW  ECG: 03/13/24: Aflutter with variable block    ECHO: TTE 06/21/23: EF 35-40, Gr 2 DD, NL RVSF, mil dLAE, mild MR, mild AI, AV sclerosis, RAP 8, ant-apical AK  TTE 03/02/24: LVEF 25-30%, reigonal WMA, moderately reduced RV function   CATH: LHC 03/14/2023 ([facility]): LAD proximal/mid stent patent with 50 ISR, LIMA-LAD atretic, D1 jailed, SVG-D1 patent, LCx distal 100, RCA mid tandem 90, 80, SVG-distal RCA stent with 90 ISR    Heart failure review: - Classification: Heart failure with reduced EF - Etiology: Ischemic - NYHA Class: III - Volume status: Hypervolemic - ACEi/ARB/ARNI: Plan to start at a subsequent visit - Aldosterone antagonist: Maximally tolerated dose - Beta-blocker: Intolerant - Digoxin: Maximally tolerated dose - Hydralazine/Nitrates: Not a candidate - SGLT2i: Maximally tolerated dose - GLP-1: Consider in future - Advanced therapies: Not a candidate - ICD: Currently uptitrating GDMT  ASSESSMENT & PLAN:  Chronic systolic heart failure: Ischemic related with recent worsening in the setting of atrial fibrillation with RVR.  Currently in atrial flutter today.  Etiology likely due to heart failure as well as untreated sleep apnea.  Does not appear in extremis, will increase diuretics, arrange for cardioversion, stressed the importance of anticoagulation compliance, and set up EP appointment with Dr. Jimmey Ralph. - Patient not a candidate for advanced therapies given severe PAD. After cardioversion would consider potential ablation as he does not tolerate atrial arrhyhtmia well - Increase toresmide to 40mg  BID - Repeat labs ordered for today -  Continue spironolactone 25 mg  daily, digoxin 0.125 mg daily - Needs to restart home farxiga - EP appointment as below, appreciate assistance - Continue Kcl BID  Atrial fibrillation/flutter: Recurrent, recent hospitalization with cardiogenic shock related to A-fib.  Arrange follow-up  with Dr. Jimmey Ralph as above. - Cardioversion scheduled - Pulmonary appointment for sleep apnea given that he does not qualify for home sleep study by insurance. Also likely has element of COPD - Increase diuretics as above - Stressed compliance with Eliquis  CAD: Currently on triple therapy, extremely high risk but no recent interventions so can drop aspirin. - Stop aspirin - Continue plavix, apixaban - Continue atorvastatin, repatha, LDL at goal  PAD: Extensive history, carotid as well as femoral disease.  - Not a candidate for advanced therapies - Continue eliquis, plavix, aggressive risk factor modification - Smoking cessation encouraged  Follow up in 2 months  Clearnce Hasten, MD Advanced Heart Failure Mechanical Circulatory Support 03/14/24

## 2024-03-13 NOTE — Patient Instructions (Addendum)
 CHANGE Torsemide to 40 mg Twice daily  Labs done today, your results will be available in MyChart, we will contact you for abnormal readings.   You have been referred to the pulmonary. They will call you to arrange the appointment.  You have been referred to the Electrophysiologist. They will call you to arrange your appointment.   You are scheduled for a Cardioversion on Monday, April 28 with Dr. Elwyn Lade.  Please arrive at the [facility] (Main Entrance A) at [facility]: [address] [place], [place] [address] at 8:30 AM (This time is 1 hour(s) before your procedure to ensure your preparation).   Free valet parking service is available. You will check in at ADMITTING.   *Please Note: You will receive a call the day before your procedure to confirm the appointment time. That time may have changed from the original time based on the schedule for that day.*   DIET:  Nothing to eat or drink after midnight except a sip of water with medications (see medication instructions below)  MEDICATION INSTRUCTIONS:  HOLD YOUR Torsemide and Spironolactone the morning of your procedure.  TAKE 25 UNITS OF LANTUS THE NIGHT BEFORE YOUR PROCEDURE.  HOLD ALL INSULINS THE MORNING OF YOUR PROCEDURE.        Continue taking your anticoagulant (blood thinner): Apixaban (Eliquis).  You will need to continue this after your procedure until you are told by your provider that it is safe to stop.     FYI:  For your safety, and to allow [place] to monitor your vital signs accurately during the surgery/procedure we request: If you have artificial nails, gel coating, SNS etc, please have those removed prior to your surgery/procedure. Not having the nail coverings /polish removed may result in cancellation or delay of your surgery/procedure.  Your support person will be asked to wait in the waiting room during your procedure.  It is OK to have someone drop you off and come back when you are ready to be  discharged.  You cannot drive after the procedure and will need someone to drive you home.  Bring your insurance cards.  *Special Note: Every effort is made to have your procedure done on time. Occasionally there are emergencies that occur at the hospital that may cause delays. Please be patient if a delay does occur.    Your physician recommends that you schedule a follow-up appointment in: 3 MONTHS ( July) ** PLEASE CALL THE OFFICE IN MID MAY TO ARRANGE YOUR FOLLOW UP APPOINTMENT.**  If you have any questions or concerns before your next appointment please send [place] a message through [place] or call our office at [phone].    TO LEAVE A MESSAGE FOR THE NURSE SELECT OPTION 2, PLEASE LEAVE A MESSAGE INCLUDING: YOUR NAME DATE OF BIRTH CALL BACK NUMBER REASON FOR CALL**this is important as we prioritize the call backs  YOU WILL RECEIVE A CALL BACK THE SAME DAY AS LONG AS YOU CALL BEFORE 4:00 PM  At the [facility], you and your health needs are our priority. As part of our continuing mission to provide you with exceptional heart care, we have created designated Provider Care Teams. These Care Teams include your primary Cardiologist (physician) and Advanced Practice Providers (APPs- Physician Assistants and Nurse Practitioners) who all work together to provide you with the care you need, when you need it.   You may see any of the following providers on your designated Care Team at your next follow  up: Dr Arvilla Meres Dr Marca Ancona Dr. Dorthula Nettles Dr. Clearnce Hasten Amy Filbert Schilder, NP Robbie Lis, [place] [facility] [place], [place] Brynda Peon, NP [place] Lee, NP Clarisa Kindred, NP Karle Plumber, PharmD Enos Fling, PharmD   Please be sure to bring in all your medications bottles to every appointment.    Thank you for choosing [facility]

## 2024-03-13 NOTE — Progress Notes (Signed)
 ADVANCED HEART FAILURE FOLLOW UP CLINIC NOTE  Referring Physician: Fleet Contras, MD  Primary Care: Fleet Contras, MD Primary Cardiologist:  HPI: Ryan [name] is a 58 y.o. male with a PMH of CAD with multiple PCI and CABG, PAD, HLD, LV apical thrombus, carotid artery disease, tobacco use who presents for follow up of recent hospitalization for cardiogenic shock and afib.      Patient has had extensive history of atherosclerotic disease.  She is had CABG in 2021, multiple PCI including PCI to a vein graft to the RCA he also underwent aortobifem bypass in 2014, left carotid to subclavian bypass in 2014, right femoral to popliteal bypass and right femoral endarterectomy, angioplasty to the right CFA, last in 2024, and left femoral endarterectomy in 2024.     SUBJECTIVE:  Patient was recently discharged after admission for cardiogenic shock in the setting of atrial fibrillation.  He underwent cardioversion and required inotrope assisted diuresis.  He felt better, though is still significantly short of breath with mild to moderate exertion.  His volume status has improved and he does not have as much orthopnea as he did prior.  He does feel that he is having some palpitations.  He denies any dizziness or falls.  He denies any active chest pain.  He has never been tested for sleep apnea, but his daughter confirms that he snores quite a bit.  PMH, current medications, allergies, social history, and family history reviewed in epic.  PHYSICAL EXAM: Vitals:   03/13/24 1204  BP: 108/70  Pulse: (!) 118  SpO2: 92%   GENERAL: Chronically ill-appearing PULM:  Normal work of breathing, clear to auscultation bilaterally. Respirations are unlabored.  CARDIAC:  JVP: mildly elevated         Tachycardic with irregular rhythm, systolic murmur, 1+ LE edema. Warm and well perfused extremities. ABDOMEN: Soft, non-tender, mildly distended NEUROLOGIC: Patient is oriented x3 with no focal or lateralizing  neurologic deficits.    DATA REVIEW  ECG: 03/13/24: Aflutter with variable block    ECHO: TTE 06/21/23: EF 35-40, Gr 2 DD, NL RVSF, mil dLAE, mild MR, mild AI, AV sclerosis, RAP 8, ant-apical AK  TTE 03/02/24: LVEF 25-30%, reigonal WMA, moderately reduced RV function   CATH: LHC 03/14/2023 ([facility]): LAD proximal/mid stent patent with 50 ISR, LIMA-LAD atretic, D1 jailed, SVG-D1 patent, LCx distal 100, RCA mid tandem 90, 80, SVG-distal RCA stent with 90 ISR    Heart failure review: - Classification: Heart failure with reduced EF - Etiology: Ischemic - NYHA Class: III - Volume status: Hypervolemic - ACEi/ARB/ARNI: Plan to start at a subsequent visit - Aldosterone antagonist: Maximally tolerated dose - Beta-blocker: Intolerant - Digoxin: Maximally tolerated dose - Hydralazine/Nitrates: Not a candidate - SGLT2i: Maximally tolerated dose - GLP-1: Consider in future - Advanced therapies: Not a candidate - ICD: Currently uptitrating GDMT  ASSESSMENT & PLAN:  Chronic systolic heart failure: Ischemic related with recent worsening in the setting of atrial fibrillation with RVR.  Currently in atrial flutter today.  Etiology likely due to heart failure as well as untreated sleep apnea.  Does not appear in extremis, will increase diuretics, arrange for cardioversion, stressed the importance of anticoagulation compliance, and set up EP appointment with Dr. Jimmey Ralph. - Patient not a candidate for advanced therapies given severe PAD. After cardioversion would consider potential ablation as he does not tolerate atrial arrhyhtmia well - Increase toresmide to 40mg  BID - Repeat labs ordered for today -  Continue spironolactone 25 mg  daily, digoxin 0.125 mg daily - Needs to restart home farxiga - EP appointment as below, appreciate assistance - Continue Kcl BID  Atrial fibrillation/flutter: Recurrent, recent hospitalization with cardiogenic shock related to A-fib.  Arrange follow-up  with Dr. Jimmey Ralph as above. - Cardioversion scheduled - Pulmonary appointment for sleep apnea given that he does not qualify for home sleep study by insurance. Also likely has element of COPD - Increase diuretics as above - Stressed compliance with Eliquis  CAD: Currently on triple therapy, extremely high risk but no recent interventions so can drop aspirin. - Stop aspirin - Continue plavix, apixaban - Continue atorvastatin, repatha, LDL at goal  PAD: Extensive history, carotid as well as femoral disease.  - Not a candidate for advanced therapies - Continue eliquis, plavix, aggressive risk factor modification - Smoking cessation encouraged  Follow up in 2 months  Clearnce Hasten, MD Advanced [facility] Failure Mechanical Circulatory Support 03/14/24

## 2024-03-13 NOTE — Progress Notes (Signed)
 Electrophysiology Office Note:   Date:  03/15/2024  ID:  Alean Amen, DOB 09/09/1966, MRN [id number]  Primary Cardiologist: Antoinette Batman, MD Electrophysiologist: None      History of Present Illness:   Ryan [name] is a 58 y.o. male with h/o CAD s/p STEMI 12/2011 with DES to LAD, multiple PCIs - 10/2016, 07/2017, and 05/2019, CABG in 05/2020, NSTEMI 03/2022 with DES, PAD s/p aorto-bifem bypass in 2014, L carotid to subclavian bypass in 04/2013, R fem to AK popliteal bypass and R fem endarterectomy, angioplasty to right CFA 06/2021 and 12/2022, L femoral endarterectomy 01/2023, HFrEF, HTN, DM, COPD, tobacco use who is being seen today for evaluation of his atrial flutter at the request of Dr. Alease Amend.   Discussed the use of AI scribe software for clinical note transcription with the patient, who gave verbal consent to proceed.  History of Present Illness I first met the patient in the ER on 03/01/2024 when he presented with new onset atrial flutter, which was accompanied by acute systolic heart failure.  He was aggressively diuresed and underwent cardioversion.  He states that he felt much better while in normal rhythm, with less shortness of breath and fatigue.  Unfortunately, he went back into atrial flutter within 1 week. He experiences symptoms of fluid buildup and shortness of breath due to the arrhythmia. He feels better than when he initially presented to the ER but can still feel the fluttering. He has ongoing swelling in his legs, though it is not as severe as before. Approximately 20 to 30 pounds of fluid were removed during his hospital stay. He still experiences shortness of breath with exertion, such as walking to his car, but notes improvement when in normal rhythm.He is currently on amiodarone to maintain normal rhythm but continues to experience arrhythmias. He notes that amiodarone may not be suitable for long-term use due to concerns about his lungs.  Review of systems complete and  found to be negative unless listed in HPI.   EP Information / Studies Reviewed:    EKG is ordered today. Personal review as below.  EKG Interpretation Date/Time:  Friday March 14 2024 11:11:02 EDT Ventricular Rate:  95 PR Interval:    QRS Duration:  66 QT Interval:  370 QTC Calculation: 464 R Axis:   41  Text Interpretation: Atrial flutter with variable A-V block Anterolateral infarct (cited on or before 29-Mar-2022) When compared with ECG of 13-Mar-2024 12:14, QRS axis Shifted left Questionable change in initial forces of Anterior leads Nonspecific T wave abnormality has replaced inverted T waves in Inferior leads Confirmed by Ardeen Kohler [phone]) on 03/15/2024 9:06:11 AM   03/01/24: AFL   Risk Assessment/Calculations:    CHA2DS2-VASc Score = 4   This indicates a 4.8% annual risk of stroke. The patient's score is based upon: CHF History: 1 HTN History: 1 Diabetes History: 1 Stroke History: 0 Vascular Disease History: 1 Age Score: 0 Gender Score: 0             Physical Exam:   VS:  BP 104/62   Pulse 92   Ht 5\' 9"  (1.753 m)   Wt 214 lb (97.1 kg)   SpO2 92%   BMI 31.60 kg/m    Wt Readings from Last 3 Encounters:  03/14/24 214 lb (97.1 kg)  03/13/24 213 lb 12.8 oz (97 kg)  03/06/24 213 lb 13.5 oz (97 kg)     GEN: Well nourished, well developed in no acute distress NECK: No JVD CARDIAC: Normal rate,  irregular rhythm RESPIRATORY:  Clear to auscultation without rales, wheezing or rhonchi  ABDOMEN: Soft, non-distended EXTREMITIES: Trace edema; No deformity   ASSESSMENT AND PLAN:   #.  Typical atrial flutter: Symptomatic.  Associated with acute on chronic systolic heart failure.  For this reason, we have prioritized a rhythm control strategy. #.  Secondary hypercoagulable state due to atrial flutter: CHA2DS2-VASc score of 4. #.  High risk medication use: Antiarrhythmic drug therapy - amiodarone.  -Discussed treatment options today for AFL including antiarrhythmic  drug therapy and ablation. Discussed risks, recovery and likelihood of success with each treatment strategy. Risk, benefits, and alternatives to EP study and ablation for AFL were discussed. These risks include but are not limited to stroke, bleeding, vascular damage, tamponade, perforation, damage to the esophagus, lungs, phrenic nerve and other structures, worsening renal function, coronary vasospasm and death.  Discussed potential need for repeat ablation procedures and antiarrhythmic drugs after an initial ablation. The patient understands these risk and wishes to proceed.  We will therefore proceed with catheter ablation at the next available time.  Carto, ICE, anesthesia are requested for the procedure.  Will also obtain CT PV protocol prior to the procedure to exclude LAA thrombus and further evaluate atrial anatomy.  Given that he is high risk for atrial fibrillation based on his comorbidities, we will also plan to implant a loop recorder to monitor for atrial fibrillation. -Continue amiodarone 400 mg twice daily for 1 week, then 200 mg twice daily for 1 week, then 200 mg once daily thereafter.  Given the patient's lung disease, amiodarone is preferably only to be a short-term option, thus we will proceed with ablation as above. -Continue Eliquis 5 mg twice daily with no interruption. -Scheduled for repeat cardioversion on 03/31/2024.   #.  Chronic combined systolic and diastolic heart failure:  -Continue home GDMT -Entresto 24-26 mg twice daily, spironolactone 25 mg once daily, carvedilol 6.25 mg twice daily.  Would hold on Farxiga given the patient is likely to need cardioversion. -Acute drop in LVEF is likely the result of his atrial flutter.  Following ablation and with maintenance of sinus rhythm, we will need to repeat echocardiogram to see if LVEF is back above 35%.  If LVEF remains less than 35% then patient warrants discussion regarding ICD therapy.   #.  History of LV thrombus: This  occurred years ago in the setting of acute MI.  He has been on Eliquis he states for at least 3 years.  No LV thrombus visualized on echo from 03/02/2024. -New Eliquis as above.   #.  CAD status post multiple PCI's and CABG: Currently denies chest pain.  Troponins are similar to baseline. #. Hyperlipidemia:  -Continue clopidogrel 75 mg once daily. -Continue atorvastatin 80 mg daily. Continue Zetia 10 mg daily.  Follow up with Dr. Daneil Dunker  3 months after ablation.   Signed, Ardeen Kohler, MD

## 2024-03-14 ENCOUNTER — Encounter: Payer: Self-pay | Admitting: Cardiology

## 2024-03-14 ENCOUNTER — Ambulatory Visit: Attending: Cardiology | Admitting: Cardiology

## 2024-03-14 VITALS — BP 104/62 | HR 92 | Ht 69.0 in | Wt 214.0 lb

## 2024-03-14 DIAGNOSIS — I5042 Chronic combined systolic (congestive) and diastolic (congestive) heart failure: Secondary | ICD-10-CM | POA: Diagnosis not present

## 2024-03-14 DIAGNOSIS — I483 Typical atrial flutter: Secondary | ICD-10-CM | POA: Diagnosis not present

## 2024-03-14 DIAGNOSIS — I2510 Atherosclerotic heart disease of native coronary artery without angina pectoris: Secondary | ICD-10-CM

## 2024-03-14 DIAGNOSIS — Z79899 Other long term (current) drug therapy: Secondary | ICD-10-CM

## 2024-03-14 DIAGNOSIS — I236 Thrombosis of atrium, auricular appendage, and ventricle as current complications following acute myocardial infarction: Secondary | ICD-10-CM

## 2024-03-14 DIAGNOSIS — D6869 Other thrombophilia: Secondary | ICD-10-CM | POA: Diagnosis not present

## 2024-03-14 DIAGNOSIS — I480 Paroxysmal atrial fibrillation: Secondary | ICD-10-CM

## 2024-03-14 NOTE — Patient Instructions (Signed)
 Medication Instructions:  Your physician recommends that you continue on your current medications as directed. Please refer to the Current Medication list given to you today.  *If you need a refill on your cardiac medications before your next appointment, please call your pharmacy*  Lab Work: BMET and CBC - you may go to any [facility] location to have these drawn within 30 days of your procedure  Testing/Procedures: Cardiac CT Your physician has requested that you have cardiac CT. Cardiac computed tomography (CT) is a painless test that uses an x-ray machine to take clear, detailed pictures of your heart. For further information please visit [email or web address] Please follow instruction sheet as given. We will call you to schedule your CT scan. It will be done about two to three weeks prior to your ablation.  Ablation and Loop Recorder Implant Your physician has recommended that you have an ablation. Catheter ablation is a medical procedure used to treat some cardiac arrhythmias (irregular heartbeats). During catheter ablation, a long, thin, flexible tube is put into a blood vessel in your groin (upper thigh), or neck. This tube is called an ablation catheter. It is then guided to your heart through the blood vessel. Radio frequency waves destroy small areas of heart tissue where abnormal heartbeats may cause an arrhythmia to start. Please see the instruction sheet given to you today.  Follow-Up: At [facility], you and your health needs are our priority.  As part of our continuing mission to provide you with exceptional heart care, we have created designated Provider Care Teams.  These Care Teams include your primary Cardiologist (physician) and Advanced Practice Providers (APPs -  Physician Assistants and Nurse Practitioners) who all work together to provide you with the care you need, when you need it.   Your next appointment:   We will contact you about your post-procedure follow up  appointments.

## 2024-03-28 NOTE — Progress Notes (Signed)
 Spoke to patient and instructed them to come at 0730  and to be NPO after 0000.  Medications reviewed.    Confirmed that patient will have a ride home and someone to stay with them for 24 hours after the procedure.

## 2024-03-30 ENCOUNTER — Encounter (HOSPITAL_COMMUNITY): Payer: Self-pay | Admitting: Cardiology

## 2024-03-30 NOTE — Anesthesia Preprocedure Evaluation (Signed)
 Anesthesia Evaluation  Patient identified by MRN, date of birth, ID band Patient awake    Reviewed: Allergy & Precautions, NPO status , Patient's Chart, lab work & pertinent test results, reviewed documented beta blocker date and time   Airway Mallampati: II  TM Distance: >3 FB     Dental  (+) Edentulous Upper, Dental Advisory Given, Missing, Caps,    Pulmonary COPD,  COPD inhaler, Current Smoker and Patient abstained from smoking.   breath sounds clear to auscultation + decreased breath sounds      Cardiovascular hypertension, Pt. on medications + angina  + CAD, + Past MI, + Peripheral Vascular Disease and +CHF  + dysrhythmias Atrial Fibrillation  Rhythm:Irregular Rate:Normal     Neuro/Psych negative neurological ROS  negative psych ROS   GI/Hepatic ,GERD  ,,  Endo/Other  diabetes, Poorly Controlled, Type 2, Oral Hypoglycemic Agents  Hyperlipidemia  Renal/GU Renal disease  negative genitourinary   Musculoskeletal  (+) Arthritis , Osteoarthritis,    Abdominal   Peds  Hematology Eliquis  therapy   Anesthesia Other Findings   Reproductive/Obstetrics                             Anesthesia Physical Anesthesia Plan  ASA: 3  Anesthesia Plan: General   Post-op Pain Management: Minimal or no pain anticipated   Induction: Intravenous  PONV Risk Score and Plan: 1 and Treatment may vary due to age or medical condition and Propofol  infusion  Airway Management Planned: Mask, Nasal Cannula and Natural Airway  Additional Equipment: None  Intra-op Plan:   Post-operative Plan:   Informed Consent: I have reviewed the patients History and Physical, chart, labs and discussed the procedure including the risks, benefits and alternatives for the proposed anesthesia with the patient or authorized representative who has indicated his/her understanding and acceptance.     Dental advisory given  Plan  Discussed with: Anesthesiologist and CRNA  Anesthesia Plan Comments:         Anesthesia Quick Evaluation

## 2024-03-31 ENCOUNTER — Ambulatory Visit (HOSPITAL_COMMUNITY): Payer: Self-pay | Admitting: Anesthesiology

## 2024-03-31 ENCOUNTER — Ambulatory Visit (HOSPITAL_BASED_OUTPATIENT_CLINIC_OR_DEPARTMENT_OTHER): Payer: Self-pay | Admitting: Anesthesiology

## 2024-03-31 ENCOUNTER — Other Ambulatory Visit: Payer: Self-pay

## 2024-03-31 ENCOUNTER — Ambulatory Visit (HOSPITAL_COMMUNITY)
Admission: RE | Admit: 2024-03-31 | Discharge: 2024-03-31 | Disposition: A | Discharge status: Home / Self Care | Attending: Cardiology | Admitting: Cardiology

## 2024-03-31 ENCOUNTER — Encounter (HOSPITAL_COMMUNITY): Payer: Self-pay | Admitting: Cardiology

## 2024-03-31 ENCOUNTER — Encounter (HOSPITAL_COMMUNITY)
Admission: RE | Disposition: A | Discharge status: Home / Self Care | Payer: Self-pay | Source: Home / Self Care | Attending: Cardiology

## 2024-03-31 DIAGNOSIS — Z955 Presence of coronary angioplasty implant and graft: Secondary | ICD-10-CM | POA: Diagnosis not present

## 2024-03-31 DIAGNOSIS — I4891 Unspecified atrial fibrillation: Secondary | ICD-10-CM | POA: Diagnosis not present

## 2024-03-31 DIAGNOSIS — I10 Essential (primary) hypertension: Secondary | ICD-10-CM

## 2024-03-31 DIAGNOSIS — E785 Hyperlipidemia, unspecified: Secondary | ICD-10-CM | POA: Diagnosis not present

## 2024-03-31 DIAGNOSIS — F17210 Nicotine dependence, cigarettes, uncomplicated: Secondary | ICD-10-CM | POA: Diagnosis not present

## 2024-03-31 DIAGNOSIS — Z7902 Long term (current) use of antithrombotics/antiplatelets: Secondary | ICD-10-CM | POA: Diagnosis not present

## 2024-03-31 DIAGNOSIS — I5022 Chronic systolic (congestive) heart failure: Secondary | ICD-10-CM | POA: Insufficient documentation

## 2024-03-31 DIAGNOSIS — Z7901 Long term (current) use of anticoagulants: Secondary | ICD-10-CM | POA: Insufficient documentation

## 2024-03-31 DIAGNOSIS — I739 Peripheral vascular disease, unspecified: Secondary | ICD-10-CM | POA: Insufficient documentation

## 2024-03-31 DIAGNOSIS — Z79899 Other long term (current) drug therapy: Secondary | ICD-10-CM | POA: Diagnosis not present

## 2024-03-31 DIAGNOSIS — E1151 Type 2 diabetes mellitus with diabetic peripheral angiopathy without gangrene: Secondary | ICD-10-CM | POA: Insufficient documentation

## 2024-03-31 DIAGNOSIS — I2510 Atherosclerotic heart disease of native coronary artery without angina pectoris: Secondary | ICD-10-CM | POA: Insufficient documentation

## 2024-03-31 DIAGNOSIS — I25119 Atherosclerotic heart disease of native coronary artery with unspecified angina pectoris: Secondary | ICD-10-CM | POA: Diagnosis not present

## 2024-03-31 DIAGNOSIS — Z951 Presence of aortocoronary bypass graft: Secondary | ICD-10-CM | POA: Diagnosis not present

## 2024-03-31 DIAGNOSIS — I480 Paroxysmal atrial fibrillation: Secondary | ICD-10-CM

## 2024-03-31 DIAGNOSIS — I4892 Unspecified atrial flutter: Secondary | ICD-10-CM

## 2024-03-31 HISTORY — PX: CARDIOVERSION: EP1203

## 2024-03-31 SURGERY — CARDIOVERSION (CATH LAB)
Anesthesia: General

## 2024-03-31 MED ORDER — SODIUM CHLORIDE 0.9 % IV SOLN: INTRAVENOUS | Status: DC

## 2024-03-31 MED ORDER — LIDOCAINE 2% (20 MG/ML) 5 ML SYRINGE
INTRAMUSCULAR | Status: DC | PRN
  Administered 2024-03-31: 50 mg via INTRAVENOUS

## 2024-03-31 MED ORDER — SODIUM CHLORIDE 0.9% FLUSH
INTRAVENOUS | Status: DC | PRN
  Administered 2024-03-31 (×2): 5 mL via INTRAVENOUS

## 2024-03-31 MED ORDER — PROPOFOL 10 MG/ML IV BOLUS
INTRAVENOUS | Status: DC | PRN
  Administered 2024-03-31: 50 mg via INTRAVENOUS

## 2024-03-31 SURGICAL SUPPLY — 1 items: PAD DEFIB RADIO PHYSIO CONN (PAD) ×1 IMPLANT

## 2024-03-31 NOTE — Interval H&P Note (Signed)
 History and Physical Interval Note:  03/31/2024 8:30 AM  Ryan [name]  has presented today for surgery, with the diagnosis of AFLUTTER I48.1.  The various methods of treatment have been discussed with the patient and family. After consideration of risks, benefits and other options for treatment, the patient has consented to  Procedure(s): CARDIOVERSION (N/A) as a surgical intervention.  The patient's history has been reviewed, patient examined, no change in status, stable for surgery.  I have reviewed the patient's chart and labs.  Questions were answered to the patient's satisfaction.     Lauralee Poll

## 2024-03-31 NOTE — Discharge Instructions (Signed)

## 2024-03-31 NOTE — Anesthesia Postprocedure Evaluation (Signed)
 Anesthesia Post Note  Patient: Ryan [name]  Procedure(s) Performed: CARDIOVERSION     Patient location during evaluation: PACU Anesthesia Type: General Level of consciousness: awake and alert and oriented Pain management: pain level controlled Vital Signs Assessment: post-procedure vital signs reviewed and stable Respiratory status: spontaneous breathing, nonlabored ventilation and respiratory function stable Cardiovascular status: blood pressure returned to baseline and stable Postop Assessment: no apparent nausea or vomiting Anesthetic complications: no   No notable events documented.  Last Vitals:  Vitals:   03/31/24 0850 03/31/24 0900  BP: 112/68 122/78  Pulse: 69 69  Resp: (!) 28 (!) 27  Temp:    SpO2: (!) 89% 90%    Last Pain:  Vitals:   03/31/24 0900  TempSrc:   PainSc: 0-No pain                 Diya Gervasi A.

## 2024-03-31 NOTE — CV Procedure (Signed)
   DIRECT CURRENT CARDIOVERSION  NAME:  Ryan [name]    MRN: [id number] DOB:  1966-04-03    ADMIT DATE: 03/31/2024  CARDIOVERSION:     Indications:  Symptomatic Atrial Flutter  Informed consent was obtained prior to the procedure. The risks, benefits and alternatives for the procedure were discussed and the patient comprehended these risks.  Risks include, but are not limited to treatment failure, burns to the chest, pain/discomfort, ventricular arrhythmia. Patient has been taking his eliquis  without missed doses.   After a procedural time-out, sedation was performed by anesthesia. The patient had the defibrillator pads placed in the anterior and posterior position. Once an appropriate level of sedation was confirmed, the patient was cardioverted successfully with 125J of biphasic synchronized energy.  The patient converted to NSR.  There were no apparent complications.  The patient had normal neuro status and respiratory status post procedure with vitals stable as recorded elsewhere.  Adequate airway was maintained throughout and vital signs monitored per protocol.  COMPLICATIONS:    Complications: No complications Patient tolerated procedure well.  Arta Lark Advanced [facility] 8:30 AM

## 2024-03-31 NOTE — Transfer of Care (Signed)
 Immediate Anesthesia Transfer of Care Note  Patient: Ryan [name]  Procedure(s) Performed: CARDIOVERSION  Patient Location: PACU  Anesthesia Type:MAC  Level of Consciousness: awake and alert   Airway & Oxygen Therapy: Patient Spontanous Breathing and Patient connected to nasal cannula oxygen  Post-op Assessment: Report given to RN and Post -op Vital signs reviewed and stable  Post vital signs: Reviewed and stable  Last Vitals:  Vitals Value Taken Time  BP 120/72   Temp    Pulse 72 03/31/24 0820  Resp 12 03/31/24 0820  SpO2 96 % 03/31/24 0820  Vitals shown include unfiled device data.  Last Pain:  Vitals:   03/31/24 0745  TempSrc:   PainSc: 0-No pain         Complications: No notable events documented.

## 2024-04-04 ENCOUNTER — Other Ambulatory Visit: Payer: Self-pay | Admitting: Physician Assistant

## 2024-04-04 ENCOUNTER — Other Ambulatory Visit: Payer: Self-pay | Admitting: Cardiovascular Disease

## 2024-04-04 DIAGNOSIS — I4891 Unspecified atrial fibrillation: Secondary | ICD-10-CM

## 2024-04-04 DIAGNOSIS — I513 Intracardiac thrombosis, not elsewhere classified: Secondary | ICD-10-CM

## 2024-04-04 NOTE — Telephone Encounter (Signed)
 Prescription refill request for Eliquis  received. Indication:aflutter Last office visit:4/25 Scr:1.27  4/25 Age: 58 Weight:95.3  kg  Prescription refilled

## 2024-04-08 ENCOUNTER — Other Ambulatory Visit: Payer: Self-pay

## 2024-04-08 ENCOUNTER — Other Ambulatory Visit (HOSPITAL_COMMUNITY): Payer: Self-pay

## 2024-04-09 ENCOUNTER — Encounter (HOSPITAL_COMMUNITY): Payer: Self-pay

## 2024-04-09 ENCOUNTER — Telehealth (HOSPITAL_COMMUNITY): Payer: Self-pay

## 2024-04-09 DIAGNOSIS — I483 Typical atrial flutter: Secondary | ICD-10-CM

## 2024-04-09 NOTE — Telephone Encounter (Signed)
 Spoke with patient to complete pre-procedure call.     New medical conditions?  No Recent hospitalizations or surgeries?  DCCV on 03/31/24 Started any new medications? No Patient made aware to contact office to inform of any new medications started. Any changes in activities of daily living? No  Pre-procedure testing scheduled: CT and lab work on 04/18/24. Updated CT instructions letter sent via MyChart.   Confirmed patient is taking Eliquis  twice daily and will continue taking medication before procedure or it may need to be rescheduled.  Confirmed patient is scheduled for  Atrial Flutter Ablation, Loop Recorder Insertion on Monday, June 2 with Dr. Clinton Danas. Instructed patient to arrive at the Main Entrance A at [facility]: [address] [place], [place] [address] and check in at Admitting at 8:00 AM.  Advised of plan to go home the same day and will only stay overnight if medically necessary. You MUST have a responsible adult to drive you home and MUST be with you the first 24 hours after you arrive home or your procedure could be cancelled.  Patient verbalized understanding to information provided and is agreeable to proceed with procedure.

## 2024-04-14 ENCOUNTER — Other Ambulatory Visit (HOSPITAL_COMMUNITY)

## 2024-04-14 ENCOUNTER — Ambulatory Visit: Payer: Medicaid Other | Admitting: Cardiovascular Disease

## 2024-04-18 ENCOUNTER — Ambulatory Visit (HOSPITAL_COMMUNITY)
Admission: RE | Admit: 2024-04-18 | Discharge: 2024-04-18 | Disposition: A | Discharge status: Home / Self Care | Source: Ambulatory Visit | Attending: Cardiology | Admitting: Cardiology

## 2024-04-18 DIAGNOSIS — I483 Typical atrial flutter: Secondary | ICD-10-CM | POA: Insufficient documentation

## 2024-04-18 MED ORDER — IOHEXOL 350 MG/ML SOLN
100.0000 mL | Freq: Once | INTRAVENOUS | Status: AC | PRN
  Administered 2024-04-18: 100 mL via INTRAVENOUS

## 2024-04-18 MED ORDER — IOHEXOL 350 MG/ML SOLN: 100.0000 mL | Freq: Once | INTRAVENOUS | Status: DC | PRN

## 2024-04-19 LAB — CBC
Hematocrit: 52.1 % — ABNORMAL HIGH (ref 37.5–51.0)
Hemoglobin: 16.9 g/dL (ref 13.0–17.7)
MCH: 32.6 pg (ref 26.6–33.0)
MCHC: 32.4 g/dL (ref 31.5–35.7)
MCV: 101 fL — ABNORMAL HIGH (ref 79–97)
Platelets: 195 10*3/uL (ref 150–450)
RBC: 5.18 x10E6/uL (ref 4.14–5.80)
RDW: 14.2 % (ref 11.6–15.4)
WBC: 7.5 10*3/uL (ref 3.4–10.8)

## 2024-04-19 LAB — BASIC METABOLIC PANEL WITH GFR
BUN/Creatinine Ratio: 10 (ref 9–20)
BUN: 13 mg/dL (ref 6–24)
CO2: 29 mmol/L (ref 20–29)
Calcium: 9.1 mg/dL (ref 8.7–10.2)
Chloride: 91 mmol/L — ABNORMAL LOW (ref 96–106)
Creatinine, Ser: 1.31 mg/dL — ABNORMAL HIGH (ref 0.76–1.27)
Glucose: 369 mg/dL — ABNORMAL HIGH (ref 70–99)
Potassium: 4.8 mmol/L (ref 3.5–5.2)
Sodium: 136 mmol/L (ref 134–144)
eGFR: 63 mL/min/{1.73_m2} (ref >59)

## 2024-04-20 ENCOUNTER — Ambulatory Visit: Payer: Self-pay | Admitting: Cardiology

## 2024-04-24 ENCOUNTER — Other Ambulatory Visit (HOSPITAL_COMMUNITY): Payer: Self-pay | Admitting: Cardiology

## 2024-04-30 ENCOUNTER — Telehealth (HOSPITAL_COMMUNITY): Payer: Self-pay

## 2024-04-30 NOTE — Telephone Encounter (Signed)
 Attempted to reach patient to discuss upcoming procedure, no answer. Unable to leave VM- mailbox full.

## 2024-05-01 NOTE — Telephone Encounter (Signed)
 Patient returned call to discuss upcoming procedure.   CT: completed.  Labs: completed.   Any recent signs of acute illness or been started on antibiotics? No Any new medications started? No Any medications to hold? Lantus  Insulin : take 1/2 of usual dose the night prior to procedure and the morning of procedure. If blood sugar is greater than 220 mg/dl, you may take 1/2 of usual correction dose of Humalog  on the morning of your procedure. Any missed doses of blood thinner? No Advised patient to continue taking ANTICOAGULANT: Eliquis  (Apixaban ) twice daily without missing any doses.  Medication instructions:  On the morning of your procedure DO NOT take any medication., including Eliquis  or the procedure may be rescheduled. Nothing to eat or drink after midnight prior to your procedure.  Confirmed patient is scheduled for Atrial Flutter Ablation, Loop Recorder Insertion on Monday, June 2 with Dr. Clinton Danas. Instructed patient to arrive at the Main Entrance A at [facility]: [address] [place], [place] [address] and check in at Admitting at 8:00 AM.  Advised of plan to go home the same day and will only stay overnight if medically necessary. You MUST have a responsible adult to drive you home and MUST be with you the first 24 hours after you arrive home or your procedure could be cancelled.  Patient verbalized understanding to all instructions provided and agreed to proceed with procedure.

## 2024-05-02 NOTE — Pre-Procedure Instructions (Signed)
 Instructed patient on the following items: Arrival time 0800 Nothing to eat or drink after midnight No meds AM of procedure Responsible person to drive you home and stay with you for 24 hrs  Have you missed any doses of anti-coagulant Eliquis- takes twice a day, hasn't missed any doses in last 4 weeks.  Don't take dose morning of procedure.

## 2024-05-04 NOTE — Anesthesia Preprocedure Evaluation (Signed)
 Anesthesia Evaluation  Patient identified by MRN, date of birth, ID band Patient awake    Reviewed: Allergy & Precautions, NPO status , Patient's Chart, lab work & pertinent test results  History of Anesthesia Complications Negative for: history of anesthetic complications  Airway Mallampati: III  TM Distance: >3 FB Neck ROM: Full   Comment: Previous grade II view with MAC 3, easy mask with OPA Dental  (+) Dental Advisory Given, Edentulous Upper Has 9 teeth on the bottom plus one broken tooth.:   Pulmonary neg shortness of breath, neg sleep apnea, COPD (last used inhaler 2 days ago),  COPD inhaler, neg recent URI, Current Smoker and Patient abstained from smoking.   Pulmonary exam normal breath sounds clear to auscultation       Cardiovascular hypertension, (-) angina + CAD, + Past MI (2013), + Cardiac Stents (12/12/2011), + CABG (05/05/2020), + Peripheral Vascular Disease (s/p fem-pop bypass graft 03/2018, s/p aorto-biilateral fem bypass graft) and +CHF (EF 25-30%)  + dysrhythmias (1st degree AV block) Atrial Fibrillation + Valvular Problems/Murmurs (mild-to-moderate TR, mild MR)  Rhythm:Regular Rate:Normal  HLD, subclavian steal syndrome, carotid artery stenosis, s/p carotid-subclavian bypass graft, LV apical thrombus not seen on most recent echo  TTE 03/02/2024: IMPRESSIONS    1. No left ventricular thrombus is seen (Definity  contrast was used).  Left ventricular ejection fraction, by estimation, is 25 to 30%. The left  ventricle has severely decreased function. The left ventricle demonstrates  regional wall motion abnormalities   (see scoring diagram/findings for description). The left ventricular  internal cavity size was mildly dilated. Left ventricular diastolic  function could not be evaluated. There is akinesis of the left  ventricular, mid-apical anteroseptal wall, anterior  wall and apical segment. There is severe hypokinesis  of the left  ventricular, entire inferior wall.   2. The right ventricle is suboptimally visualized. Right ventricular  systolic function is moderately reduced. The right ventricular size is  mildly enlarged. There is mildly elevated pulmonary artery systolic  pressure. The estimated right ventricular  systolic pressure is 40.4 mmHg.   3. Left atrial size was moderately dilated.   4. Right atrial size was moderately dilated.   5. The mitral valve is normal in structure. Mild mitral valve  regurgitation. No evidence of mitral stenosis.   6. Tricuspid valve regurgitation is mild to moderate.   7. The aortic valve is tricuspid. There is mild calcification of the  aortic valve. There is mild thickening of the aortic valve. Aortic valve  regurgitation is not visualized. Aortic valve sclerosis/calcification is  present, without any evidence of  aortic stenosis.   8. The inferior vena cava is dilated in size with <50% respiratory  variability, suggesting right atrial pressure of 15 mmHg.     Neuro/Psych negative neurological ROS     GI/Hepatic Neg liver ROS,GERD  ,,  Endo/Other  diabetes, Type 2    Renal/GU CRFRenal disease     Musculoskeletal  (+) Arthritis ,    Abdominal   Peds  Hematology negative hematology ROS (+) Lab Results      Component                Value               Date                      WBC  7.5                 04/18/2024                HGB                      16.9                04/18/2024                HCT                      52.1 (H)            04/18/2024                MCV                      101 (H)             04/18/2024                PLT                      195                 04/18/2024              Anesthesia Other Findings   Reproductive/Obstetrics                             Anesthesia Physical Anesthesia Plan  ASA: 4  Anesthesia Plan: General   Post-op Pain Management: Tylenol  PO  (pre-op)*   Induction: Intravenous  PONV Risk Score and Plan: 1 and Ondansetron , Dexamethasone , Midazolam  and Treatment may vary due to age or medical condition  Airway Management Planned: Oral ETT  Additional Equipment: ClearSight  Intra-op Plan:   Post-operative Plan: Extubation in OR  Informed Consent: I have reviewed the patients History and Physical, chart, labs and discussed the procedure including the risks, benefits and alternatives for the proposed anesthesia with the patient or authorized representative who has indicated his/her understanding and acceptance.     Dental advisory given  Plan Discussed with: CRNA and Anesthesiologist  Anesthesia Plan Comments: (Risks of general anesthesia discussed including, but not limited to, sore throat, hoarse voice, chipped/damaged teeth, injury to vocal cords, nausea and vomiting, allergic reactions, lung infection, heart attack, stroke, and death. All questions answered. )        Anesthesia Quick Evaluation

## 2024-05-05 ENCOUNTER — Ambulatory Visit (HOSPITAL_COMMUNITY): Payer: Self-pay | Admitting: Anesthesiology

## 2024-05-05 ENCOUNTER — Ambulatory Visit (HOSPITAL_COMMUNITY)
Admission: RE | Disposition: A | Discharge status: Home / Self Care | Payer: Self-pay | Source: Home / Self Care | Attending: Cardiology

## 2024-05-05 ENCOUNTER — Ambulatory Visit (HOSPITAL_COMMUNITY)
Admission: RE | Admit: 2024-05-05 | Discharge: 2024-05-05 | Disposition: A | Discharge status: Home / Self Care | Attending: Cardiology | Admitting: Cardiology

## 2024-05-05 ENCOUNTER — Ambulatory Visit (HOSPITAL_BASED_OUTPATIENT_CLINIC_OR_DEPARTMENT_OTHER): Payer: Self-pay | Admitting: Anesthesiology

## 2024-05-05 ENCOUNTER — Encounter (HOSPITAL_COMMUNITY): Payer: Self-pay | Admitting: Cardiology

## 2024-05-05 ENCOUNTER — Other Ambulatory Visit: Payer: Self-pay

## 2024-05-05 DIAGNOSIS — D6869 Other thrombophilia: Secondary | ICD-10-CM | POA: Diagnosis not present

## 2024-05-05 DIAGNOSIS — Z79899 Other long term (current) drug therapy: Secondary | ICD-10-CM | POA: Insufficient documentation

## 2024-05-05 DIAGNOSIS — J449 Chronic obstructive pulmonary disease, unspecified: Secondary | ICD-10-CM | POA: Diagnosis not present

## 2024-05-05 DIAGNOSIS — Z951 Presence of aortocoronary bypass graft: Secondary | ICD-10-CM | POA: Diagnosis not present

## 2024-05-05 DIAGNOSIS — I4892 Unspecified atrial flutter: Secondary | ICD-10-CM | POA: Diagnosis not present

## 2024-05-05 DIAGNOSIS — I10 Essential (primary) hypertension: Secondary | ICD-10-CM

## 2024-05-05 DIAGNOSIS — F17210 Nicotine dependence, cigarettes, uncomplicated: Secondary | ICD-10-CM

## 2024-05-05 DIAGNOSIS — Z955 Presence of coronary angioplasty implant and graft: Secondary | ICD-10-CM | POA: Insufficient documentation

## 2024-05-05 DIAGNOSIS — E1151 Type 2 diabetes mellitus with diabetic peripheral angiopathy without gangrene: Secondary | ICD-10-CM | POA: Insufficient documentation

## 2024-05-05 DIAGNOSIS — K219 Gastro-esophageal reflux disease without esophagitis: Secondary | ICD-10-CM | POA: Diagnosis not present

## 2024-05-05 DIAGNOSIS — I110 Hypertensive heart disease with heart failure: Secondary | ICD-10-CM | POA: Insufficient documentation

## 2024-05-05 DIAGNOSIS — I5023 Acute on chronic systolic (congestive) heart failure: Secondary | ICD-10-CM | POA: Diagnosis not present

## 2024-05-05 DIAGNOSIS — I2510 Atherosclerotic heart disease of native coronary artery without angina pectoris: Secondary | ICD-10-CM | POA: Insufficient documentation

## 2024-05-05 DIAGNOSIS — I252 Old myocardial infarction: Secondary | ICD-10-CM | POA: Insufficient documentation

## 2024-05-05 DIAGNOSIS — Z7901 Long term (current) use of anticoagulants: Secondary | ICD-10-CM | POA: Insufficient documentation

## 2024-05-05 DIAGNOSIS — I483 Typical atrial flutter: Secondary | ICD-10-CM | POA: Diagnosis present

## 2024-05-05 HISTORY — PX: LOOP RECORDER INSERTION: EP1214

## 2024-05-05 HISTORY — PX: A-FLUTTER ABLATION: EP1230

## 2024-05-05 LAB — GLUCOSE, CAPILLARY
Glucose-Capillary: 293 mg/dL — ABNORMAL HIGH (ref 70–99)
Glucose-Capillary: 218 mg/dL — ABNORMAL HIGH (ref 70–99)
Glucose-Capillary: 247 mg/dL — ABNORMAL HIGH (ref 70–99)
Glucose-Capillary: 270 mg/dL — ABNORMAL HIGH (ref 70–99)

## 2024-05-05 MED ORDER — PHENYLEPHRINE HCL-NACL 20-0.9 MG/250ML-% IV SOLN
INTRAVENOUS | Status: DC | PRN
  Administered 2024-05-05: 25 ug/min via INTRAVENOUS

## 2024-05-05 MED ORDER — LIDOCAINE-EPINEPHRINE 1 %-1:100000 IJ SOLN
INTRAMUSCULAR | Status: DC | PRN
  Administered 2024-05-05: 20 mL

## 2024-05-05 MED ORDER — SODIUM CHLORIDE 0.9% FLUSH: 3.0000 mL | INTRAVENOUS | Status: DC | PRN

## 2024-05-05 MED ORDER — ATROPINE SULFATE 1 MG/10ML IJ SOSY
PREFILLED_SYRINGE | INTRAMUSCULAR | Status: AC
  Filled 2024-05-05: qty 10

## 2024-05-05 MED ORDER — LIDOCAINE 2% (20 MG/ML) 5 ML SYRINGE
INTRAMUSCULAR | Status: DC | PRN
  Administered 2024-05-05: 100 mg via INTRAVENOUS

## 2024-05-05 MED ORDER — PHENYLEPHRINE 80 MCG/ML (10ML) SYRINGE FOR IV PUSH (FOR BLOOD PRESSURE SUPPORT)
PREFILLED_SYRINGE | INTRAVENOUS | Status: DC | PRN
  Administered 2024-05-05: 160 ug via INTRAVENOUS

## 2024-05-05 MED ORDER — ATROPINE SULFATE 1 MG/ML IV SOLN: INTRAVENOUS | Status: DC | PRN

## 2024-05-05 MED ORDER — HEPARIN SODIUM (PORCINE) 1000 UNIT/ML IJ SOLN
INTRAMUSCULAR | Status: AC
  Filled 2024-05-05: qty 10

## 2024-05-05 MED ORDER — AMIODARONE HCL 200 MG PO TABS: 200.0000 mg | ORAL_TABLET | Freq: Every day | ORAL | 0 refills | Status: DC

## 2024-05-05 MED ORDER — HEPARIN (PORCINE) IN NACL 1000-0.9 UT/500ML-% IV SOLN
INTRAVENOUS | Status: DC | PRN
Start: 2024-05-05 — End: 2024-05-05
  Administered 2024-05-05: 500 mL

## 2024-05-05 MED ORDER — ALBUTEROL SULFATE HFA 108 (90 BASE) MCG/ACT IN AERS
INHALATION_SPRAY | RESPIRATORY_TRACT | Status: DC | PRN
  Administered 2024-05-05: 8 via RESPIRATORY_TRACT

## 2024-05-05 MED ORDER — ROCURONIUM BROMIDE 10 MG/ML (PF) SYRINGE
PREFILLED_SYRINGE | INTRAVENOUS | Status: DC | PRN
  Administered 2024-05-05: 10 mg via INTRAVENOUS
  Administered 2024-05-05: 100 mg via INTRAVENOUS

## 2024-05-05 MED ORDER — SODIUM CHLORIDE 0.9 % IV SOLN: 250.0000 mL | INTRAVENOUS | Status: DC | PRN

## 2024-05-05 MED ORDER — ACETAMINOPHEN 325 MG PO TABS
650.0000 mg | ORAL_TABLET | ORAL | Status: DC | PRN
Start: 2024-05-05 — End: 2024-05-05

## 2024-05-05 MED ORDER — ACETAMINOPHEN 500 MG PO TABS: 1000.0000 mg | ORAL_TABLET | Freq: Once | ORAL | Status: AC

## 2024-05-05 MED ORDER — SUGAMMADEX SODIUM 200 MG/2ML IV SOLN
INTRAVENOUS | Status: DC | PRN
  Administered 2024-05-05: 200 mg via INTRAVENOUS

## 2024-05-05 MED ORDER — FENTANYL CITRATE (PF) 100 MCG/2ML IJ SOLN
INTRAMUSCULAR | Status: AC
Start: 2024-05-05 — End: ?
  Filled 2024-05-05: qty 2

## 2024-05-05 MED ORDER — ETOMIDATE 2 MG/ML IV SOLN
INTRAVENOUS | Status: DC | PRN
  Administered 2024-05-05: 10 mg via INTRAVENOUS

## 2024-05-05 MED ORDER — SODIUM CHLORIDE 0.9% FLUSH: 3.0000 mL | Freq: Two times a day (BID) | INTRAVENOUS | Status: DC

## 2024-05-05 MED ORDER — HEPARIN SODIUM (PORCINE) 1000 UNIT/ML IJ SOLN
INTRAMUSCULAR | Status: DC | PRN
Start: 2024-05-05 — End: 2024-05-05
  Administered 2024-05-05: 1000 [IU]

## 2024-05-05 MED ORDER — SODIUM CHLORIDE 0.9 % IV SOLN: INTRAVENOUS | Status: DC

## 2024-05-05 MED ORDER — MIDAZOLAM HCL 2 MG/2ML IJ SOLN
INTRAMUSCULAR | Status: DC | PRN
  Administered 2024-05-05: 2 mg via INTRAVENOUS

## 2024-05-05 MED ORDER — ONDANSETRON HCL 4 MG/2ML IJ SOLN
INTRAMUSCULAR | Status: DC | PRN
  Administered 2024-05-05: 4 mg via INTRAVENOUS

## 2024-05-05 MED ORDER — FENTANYL CITRATE (PF) 250 MCG/5ML IJ SOLN
INTRAMUSCULAR | Status: DC | PRN
Start: 2024-05-05 — End: 2024-05-05
  Administered 2024-05-05 (×2): 50 ug via INTRAVENOUS

## 2024-05-05 MED ORDER — ACETAMINOPHEN 500 MG PO TABS
ORAL_TABLET | ORAL | Status: AC
Start: 2024-05-05 — End: 2024-05-05
  Administered 2024-05-05: 1000 mg via ORAL
  Filled 2024-05-05: qty 2

## 2024-05-05 MED ORDER — LABETALOL HCL 5 MG/ML IV SOLN
INTRAVENOUS | Status: DC | PRN
Start: 2024-05-05 — End: 2024-05-05
  Administered 2024-05-05: 7.5 mg via INTRAVENOUS

## 2024-05-05 MED ORDER — INSULIN ASPART 100 UNIT/ML IJ SOLN
INTRAMUSCULAR | Status: DC | PRN
  Administered 2024-05-05: 4 [IU] via SUBCUTANEOUS

## 2024-05-05 MED ORDER — ONDANSETRON HCL 4 MG/2ML IJ SOLN: 4.0000 mg | Freq: Four times a day (QID) | INTRAMUSCULAR | Status: DC | PRN

## 2024-05-05 MED ORDER — MIDAZOLAM HCL 2 MG/2ML IJ SOLN
INTRAMUSCULAR | Status: AC
  Filled 2024-05-05: qty 2

## 2024-05-05 MED ORDER — LIDOCAINE-EPINEPHRINE 1 %-1:100000 IJ SOLN
INTRAMUSCULAR | Status: AC
  Filled 2024-05-05: qty 1

## 2024-05-05 NOTE — Discharge Instructions (Signed)
 Stop amiodarone in 1 month.

## 2024-05-05 NOTE — Progress Notes (Signed)
 Upon arrival to cath lab holding, CRNA informed this RN that an accu-check was completed by sticking pts right lower ear lobe, ear lobe continues to ooze blood, gauze in place, removed, small purplish area noted to posterior, lower right ear lobe, new 2x2 gauze placed and wrapped with co-band, oozing decreased, safety maintained

## 2024-05-05 NOTE — Progress Notes (Addendum)
 Client refused to stay in bed and walked to bathroom and tolerated well; bilat groins stable, no bleeding or hematoma; Dr Daneil Dunker in and ok to d/c home after bedrest; and Dr Daneil Dunker notified of CBG and no new orders

## 2024-05-05 NOTE — Anesthesia Postprocedure Evaluation (Signed)
 Anesthesia Post Note  Patient: Ryan [name]  Procedure(s) Performed: A-FLUTTER ABLATION LOOP RECORDER INSERTION     Patient location during evaluation: PACU Anesthesia Type: General Level of consciousness: awake Pain management: pain level controlled Vital Signs Assessment: post-procedure vital signs reviewed and stable Respiratory status: spontaneous breathing, nonlabored ventilation and respiratory function stable Cardiovascular status: blood pressure returned to baseline and stable Postop Assessment: no apparent nausea or vomiting Anesthetic complications: no   There were no known notable events for this encounter.  Last Vitals:  Vitals:   05/05/24 1430 05/05/24 1520  BP: (!) 146/84 (!) 142/87  Pulse: (!) 58 (!) 59  Resp:  (!) 21  Temp:    SpO2: 96% 90%    Last Pain:  Vitals:   05/05/24 1250  TempSrc: Axillary  PainSc: 0-No pain                 Conard Decent

## 2024-05-05 NOTE — H&P (Signed)
 Electrophysiology Note:   Date:  05/05/24  ID:  Alean Amen, DOB 06-27-1966, MRN [id number]   Primary Cardiologist: Antoinette Batman, MD Electrophysiologist: None       History of Present Illness:   Ryan [name] is a 58 y.o. male with h/o CAD s/p STEMI 12/2011 with DES to LAD, multiple PCIs - 10/2016, 07/2017, and 05/2019, CABG in 05/2020, NSTEMI 03/2022 with DES, PAD s/p aorto-bifem bypass in 2014, L carotid to subclavian bypass in 04/2013, R fem to AK popliteal bypass and R fem endarterectomy, angioplasty to right CFA 06/2021 and 12/2022, L femoral endarterectomy 01/2023, HFrEF, HTN, DM, COPD, tobacco use who is being seen today for evaluation of his atrial flutter at the request of Dr. Alease Amend.    Discussed the use of AI scribe software for clinical note transcription with the patient, who gave verbal consent to proceed.   History of Present Illness I first met the patient in the ER on 03/01/2024 when he presented with new onset atrial flutter, which was accompanied by acute systolic heart failure.  He was aggressively diuresed and underwent cardioversion.  He states that he felt much better while in normal rhythm, with less shortness of breath and fatigue.  Unfortunately, he went back into atrial flutter within 1 week. He experiences symptoms of fluid buildup and shortness of breath due to the arrhythmia. He feels better than when he initially presented to the ER but can still feel the fluttering. He has ongoing swelling in his legs, though it is not as severe as before. Approximately 20 to 30 pounds of fluid were removed during his hospital stay. He still experiences shortness of breath with exertion, such as walking to his car, but notes improvement when in normal rhythm.He is currently on amiodarone  to maintain normal rhythm but continues to experience arrhythmias. He notes that amiodarone  may not be suitable for long-term use due to concerns about his lungs.  Interval: Patient presents today for  ablation and loop recorder insertion. No recurrence since last cardioversion. But does report occasional "flutters". Otherwise doing well. No new or acute complaints.    Review of systems complete and found to be negative unless listed in HPI.    EP Information / Studies Reviewed:      EKG Interpretation Date/Time:                  Friday March 14 2024 11:11:02 EDT Ventricular Rate:         95 PR Interval:                   QRS Duration:             66 QT Interval:                 370 QTC Calculation:464 R Axis:                         41   Text Interpretation:Atrial flutter with variable A-V block Anterolateral infarct (cited on or before 29-Mar-2022) When compared with ECG of 13-Mar-2024 12:14, QRS axis Shifted left Questionable change in initial forces of Anterior leads Nonspecific T wave abnormality has replaced inverted T waves in Inferior leads Confirmed by Ardeen Kohler [phone]) on 03/15/2024 9:06:11 AM    03/01/24: AFL    Risk Assessment/Calculations:     CHA2DS2-VASc Score = 4   This indicates a 4.8% annual risk of stroke. The patient's score is based upon: CHF  History: 1 HTN History: 1 Diabetes History: 1 Stroke History: 0 Vascular Disease History: 1 Age Score: 0 Gender Score: 0               Physical Exam:    Vitals:   05/05/24 0810  BP: (!) 142/79  Pulse: 67  Resp: 18  Temp: 98.1 F (36.7 C)  SpO2: 96%   GEN: Well nourished, well developed in no acute distress NECK: No JVD CARDIAC: Normal rate, regular rhythm RESPIRATORY:  Clear to auscultation without rales, wheezing or rhonchi  ABDOMEN: Soft, non-distended EXTREMITIES: Trace edema; No deformity    ASSESSMENT AND PLAN:   #.  Typical atrial flutter: Symptomatic.  Associated with acute on chronic systolic heart failure.  For this reason, we have prioritized a rhythm control strategy. #.  Secondary hypercoagulable state due to atrial flutter: CHA2DS2-VASc score of 4. #.  High risk medication use:  Antiarrhythmic drug therapy - amiodarone .  -Discussed treatment options today for AFL including antiarrhythmic drug therapy and ablation. Discussed risks, recovery and likelihood of success with each treatment strategy. Risk, benefits, and alternatives to EP study and ablation for AFL were discussed. These risks include but are not limited to stroke, bleeding, vascular damage, tamponade, perforation, damage to the esophagus, lungs, phrenic nerve and other structures, worsening renal function, coronary vasospasm and death.  Discussed potential need for repeat ablation procedures and antiarrhythmic drugs after an initial ablation. The patient understands these risk and wishes to proceed.  We will therefore proceed with catheter ablation at the next available time.  Carto, ICE, anesthesia are requested for the procedure.  Will also obtain CT PV protocol prior to the procedure to exclude LAA thrombus and further evaluate atrial anatomy.  Given that he is high risk for atrial fibrillation based on his comorbidities, we will also plan to implant a loop recorder to monitor for atrial fibrillation. -Continue amiodarone  200mg  daily for 1 month. -Continue Eliquis  5 mg twice daily with no interruption.   Follow up with Dr. Daneil Dunker 3 months after ablation.    Signed, Ardeen Kohler, MD

## 2024-05-05 NOTE — Transfer of Care (Signed)
 Immediate Anesthesia Transfer of Care Note  Patient: Leelyn Jasinski  Procedure(s) Performed: A-FLUTTER ABLATION LOOP RECORDER INSERTION  Patient Location: PACU  Anesthesia Type:General  Level of Consciousness: awake, alert , and oriented  Airway & Oxygen Therapy: Patient Spontanous Breathing and Patient connected to face mask oxygen  Post-op Assessment: Report given to RN and Post -op Vital signs reviewed and stable  Post vital signs: Reviewed and stable  Last Vitals:  Vitals Value Taken Time  BP    Temp    Pulse 51 05/05/24 1217  Resp 24 05/05/24 1217  SpO2 96 % 05/05/24 1217  Vitals shown include unfiled device data.  Last Pain:  Vitals:   05/05/24 0823  TempSrc:   PainSc: 0-No pain         Complications: There were no known notable events for this encounter.

## 2024-05-05 NOTE — Anesthesia Procedure Notes (Signed)
 Procedure Name: Intubation Date/Time: 05/05/2024 10:36 AM  Performed by: Kalai Baca J, CRNAPre-anesthesia Checklist: Patient identified, Emergency Drugs available, Suction available and Patient being monitored Patient Re-evaluated:Patient Re-evaluated prior to induction Oxygen Delivery Method: Circle System Utilized Preoxygenation: Pre-oxygenation with 100% oxygen Induction Type: IV induction Ventilation: Mask ventilation without difficulty and Oral airway inserted - appropriate to patient size Laryngoscope Size: Miller and 3 Grade View: Grade I Tube type: Oral Number of attempts: 1 Airway Equipment and Method: Stylet and Oral airway Placement Confirmation: ETT inserted through vocal cords under direct vision, positive ETCO2 and breath sounds checked- equal and bilateral Secured at: 23 cm Tube secured with: Tape Dental Injury: Teeth and Oropharynx as per pre-operative assessment

## 2024-05-06 ENCOUNTER — Telehealth (HOSPITAL_COMMUNITY): Payer: Self-pay

## 2024-05-06 NOTE — Telephone Encounter (Signed)
 Spoke with patient to complete post procedure follow up call.   Patient reports no complications with incision site from Implantable loop recorder or groin sites after ablation.   Instructions reviewed with patient:  Remove large bandage on chest and at puncture site after 24 hours. Keep the steri-strips underneath in place at chest site. You may shower after 72 hours / 3 days from your procedure with the steri-strips in place. It is normal to have bruising, tenderness, mild swelling, and a pea or marble sized lump/knot at the groin site which can take up to three months to resolve.  Get help right away if you notice sudden swelling at the puncture site.  Check your puncture site every day for signs of infection: fever, redness, swelling, pus drainage, warmth, foul odor or excessive pain. If this occurs, please call the office at [phone], to speak with the nurse. Get help right away if your puncture site is bleeding and the bleeding does not stop after applying firm pressure to the area.  You may continue to have skipped beats/ atrial fibrillation during the first several months after your procedure.  It is very important not to miss any doses of your blood thinner Eliquis . Patient restarted taking this medication on yesterday, 05/05/24.   You will follow up with the APP on 06/02/24.   Patient verbalized understanding to all instructions provided.

## 2024-05-07 MED FILL — Fentanyl Citrate Preservative Free (PF) Inj 100 MCG/2ML: INTRAMUSCULAR | Qty: 2 | Status: AC

## 2024-05-07 MED FILL — Atropine Sulfate Soln Prefill Syr 1 MG/10ML (0.1 MG/ML): INTRAMUSCULAR | Qty: 10 | Status: AC

## 2024-05-13 ENCOUNTER — Ambulatory Visit: Admitting: Pulmonary Disease

## 2024-05-27 ENCOUNTER — Other Ambulatory Visit (HOSPITAL_COMMUNITY): Payer: Self-pay

## 2024-05-27 ENCOUNTER — Telehealth: Payer: Self-pay | Admitting: Pharmacy Technician

## 2024-05-27 NOTE — Telephone Encounter (Signed)
 Pharmacy Patient Advocate Encounter   Received notification from CoverMyMeds that prior authorization for Repatha  is required/requested.   Insurance verification completed.   The patient is insured through [facility] .   Per test claim: PA required; PA submitted to above mentioned insurance via CoverMyMeds Key/confirmation #/EOC [id number] Status is pending

## 2024-05-27 NOTE — Telephone Encounter (Signed)
 Pharmacy Patient Advocate Encounter  Received notification from [facility] that Prior Authorization for Repatha  has been APPROVED from 05/27/24 to 05/27/25. Ran test claim, Copay is $4.00- 3 months. This test claim was processed through [facility]- copay amounts may vary at other pharmacies due to pharmacy/plan contracts, or as the patient moves through the different stages of their insurance plan.   PA #/Case ID/Reference #: [id number]

## 2024-05-30 ENCOUNTER — Other Ambulatory Visit: Payer: Self-pay | Admitting: Physician Assistant

## 2024-05-30 ENCOUNTER — Other Ambulatory Visit: Payer: Self-pay | Admitting: Cardiovascular Disease

## 2024-05-30 ENCOUNTER — Other Ambulatory Visit (HOSPITAL_COMMUNITY): Payer: Self-pay | Admitting: Internal Medicine

## 2024-05-31 NOTE — Progress Notes (Deleted)
 Electrophysiology Office Note:   Date:  05/31/2024  ID:  Ryan [name], DOB 11-Aug-1966, MRN [id number]  Primary Cardiologist: [name], MD Primary Heart Failure: None Electrophysiologist: Fonda Kitty, MD  {Click to update primary MD,subspecialty MD or APP then REFRESH:1}    History of Present Illness:   Ryan [name] is a 58 y.o. male with h/o AF, HFrEF, CAD s/p STEMI / CABG / DES to LAD, multiple PCI's, HTN, LV apical thrombus, NSTEMI, PAD s/p aorto-bifem bypass, subclavian steal syndrome, PVD, tobacco abuse, COPD, DM II, obesity seen today for routine electrophysiology follow-up s/p Ablation.  Since last being seen in our clinic the patient reports doing ***.    He denies chest pain, palpitations, dyspnea, PND, orthopnea, nausea, vomiting, dizziness, syncope, edema, weight gain, or early satiety.    Review of systems complete and found to be negative unless listed in HPI.   EP Information / Studies Reviewed:    EKG is ordered today. Personal review as below.      Studies:  ECHO 03/02/24 > LVEF 25-30%, PASP 40.4 mmHg, LA / RA mod dilated, TV mod regurgitation CT Cardiac Morphology / Pulm 04/18/24 > normal PV drainage into LA, aortic atherosclerosis & calcification of the LCC of the aortic valve EPS 05/05/24 > RF ablation of the CTI  Arrhythmia / AAD AFL    Device:  [employer] LUX Loop Recorder, implanted 05/05/24  Risk Assessment/Calculations:    CHA2DS2-VASc Score = 4  {Confirm score is correct.  If not, click here to update score.  REFRESH note.  :1} This indicates a 4.8% annual risk of stroke. The patient's score is based upon: CHF History: 1 HTN History: 1 Diabetes History: 1 Stroke History: 0 Vascular Disease History: 1 Age Score: 0 Gender Score: 0   {This patient has a significant risk of stroke if diagnosed with atrial fibrillation.  Please consider VKA or DOAC agent for anticoagulation if the bleeding risk is acceptable.   You can also use the  SmartPhrase .[email or web address] for documentation.   :[id number]} No BP recorded.  {Refresh Note OR Click here to enter BP  :1}***        Physical Exam:   VS:  There were no vitals taken for this visit.   Wt Readings from Last 3 Encounters:  05/05/24 200 lb (90.7 kg)  03/31/24 210 lb (95.3 kg)  03/14/24 214 lb (97.1 kg)     GEN: Well nourished, well developed in no acute distress NECK: No JVD; No carotid bruits CARDIAC: {EPRHYTHM:28826}, no murmurs, rubs, gallops RESPIRATORY:  Clear to auscultation without rales, wheezing or rhonchi  ABDOMEN: Soft, non-tender, non-distended EXTREMITIES:  No edema; No deformity   ASSESSMENT AND PLAN:    Typical Atrial Flutter  High Risk Medication: Amiodarone   CHA2DS2-VASc 4, s/p CTI ablation 05/05/24 & implant of loop recorder  -no AFL symptoms post ablation ***  -ILR review shows ***  -stop amiodarone  ***   Secondary Hypercoagulable State  Hx of LV Thrombus  Remote thrombus in setting of AMI, on eliquis  for at least 3 years -continue Eliquis  5mg  BID, dose reviewed and appropriate by age / wt   Chronic Combined Systolic & Diastolic HF  -euvolemic on exam *** -GDMT per AHF Team  -recent drop in EF thought due to AFL, plan for ECHO in August to review EF, if remains below 35% will need to consider ICD candidacy *** -follow up with Dr. Kitty post ECHO ??***  CAD s/p CABG, PCI  HLD  PAD/PVD -per Cardiology  -  plavix , atorvastatin , zetia   Follow up with Dr. Kennyth {EPFOLLOW LE:71826}  Signed, Daphne Barrack, NP-C, AGACNP-BC [facility] - Electrophysiology  05/31/2024, 8:39 AM

## 2024-06-02 ENCOUNTER — Ambulatory Visit: Admitting: Pulmonary Disease

## 2024-06-02 DIAGNOSIS — Z79899 Other long term (current) drug therapy: Secondary | ICD-10-CM

## 2024-06-02 DIAGNOSIS — I2510 Atherosclerotic heart disease of native coronary artery without angina pectoris: Secondary | ICD-10-CM

## 2024-06-02 DIAGNOSIS — I5042 Chronic combined systolic (congestive) and diastolic (congestive) heart failure: Secondary | ICD-10-CM

## 2024-06-02 DIAGNOSIS — I483 Typical atrial flutter: Secondary | ICD-10-CM

## 2024-06-02 DIAGNOSIS — I236 Thrombosis of atrium, auricular appendage, and ventricle as current complications following acute myocardial infarction: Secondary | ICD-10-CM

## 2024-06-05 ENCOUNTER — Encounter

## 2024-06-09 ENCOUNTER — Ambulatory Visit

## 2024-06-09 DIAGNOSIS — I483 Typical atrial flutter: Secondary | ICD-10-CM | POA: Diagnosis not present

## 2024-06-10 LAB — CUP PACEART REMOTE DEVICE CHECK
Date Time Interrogation Session: 20250707020100
Implantable Pulse Generator Implant Date: 20250605
Pulse Gen Serial Number: 148873

## 2024-06-21 ENCOUNTER — Ambulatory Visit: Payer: Self-pay | Admitting: Cardiology

## 2024-06-21 NOTE — Progress Notes (Unsigned)
 Electrophysiology Office Note:   Date:  06/26/2024  ID:  Ledarius Leeson, DOB July 09, 1966, MRN [id number]  Primary Cardiologist: [name], MD Primary Heart Failure: None Electrophysiologist: Fonda Kitty, MD      History of Present Illness:   Ryan [name] is a 58 y.o. male with h/o AF, HFrEF, CAD s/p STEMI / CABG / DES to LAD, multiple PCI's, HTN, LV apical thrombus, NSTEMI, PAD s/p aorto-bifem bypass, subclavian steal syndrome, PVD, tobacco abuse, COPD, DM II, obesity seen today for routine electrophysiology follow-up s/p Ablation.  Since last being seen in our clinic the patient reports he has not noted further fast heart rates. He was very short of breath getting into clinic this am. His daughter accompanies him and notes that he has been this way for a long time.  He often feels tired.     He denies chest pain, palpitations, dyspnea, PND, orthopnea, nausea, vomiting, dizziness, syncope, edema, weight gain, or early satiety.    Review of systems complete and found to be negative unless listed in HPI.   EP Information / Studies Reviewed:    EKG is ordered today. Personal review as below.  EKG Interpretation Date/Time:  Thursday June 26 2024 08:23:39 EDT Ventricular Rate:  81 PR Interval:  180 QRS Duration:  80 QT Interval:  388 QTC Calculation: 450 R Axis:   92  Text Interpretation: Normal sinus rhythm Confirmed by Aniceto Jarvis ([id number]) on 06/26/2024 8:30:04 AM   Studies:  ECHO 03/02/24 > LVEF 25-30%, PASP 40.4 mmHg, LA / RA mod dilated, TV mod regurgitation CT Cardiac Morphology / Pulm 04/18/24 > normal PV drainage into LA, aortic atherosclerosis & calcification of the LCC of the aortic valve EPS 05/05/24 > RF ablation of the CTI  Arrhythmia / AAD AFL   Amiodarone    Device:  [employer] LUX Loop Recorder, implanted 05/05/24  Risk Assessment/Calculations:    CHA2DS2-VASc Score = 4   This indicates a 4.8% annual risk of stroke. The patient's score is based  upon: CHF History: 1 HTN History: 1 Diabetes History: 1 Stroke History: 0 Vascular Disease History: 1 Age Score: 0 Gender Score: 0             Physical Exam:   VS:  BP 120/80   Pulse 81   Ht 5' 9 (1.753 m)   Wt 201 lb 6.4 oz (91.4 kg)   SpO2 (!) 83%   BMI 29.74 kg/m    Wt Readings from Last 3 Encounters:  06/26/24 201 lb 6.4 oz (91.4 kg)  05/05/24 200 lb (90.7 kg)  03/31/24 210 lb (95.3 kg)     GEN: chronically ill appearing male sitting on exam table in no acute distress NECK: No JVD; No carotid bruits CARDIAC: Regular rate and rhythm, no murmurs, rubs, gallops RESPIRATORY:  non-labored at rest, lungs bilaterally diminished without significant wheeze ABDOMEN: Soft, non-tender, non-distended EXTREMITIES:  No edema; No deformity   ASSESSMENT AND PLAN:    Typical Atrial Flutter  Paroxysmal Atrial Fibrillation  High Risk Medication: Amiodarone   CHA2DS2-VASc 4, s/p CTI ablation 05/05/24 & implant of loop recorder  -no AFL symptoms post ablation    -ILR review shows two episodes of AF on July 17th lasting 32 min, 12 min  -continue amiodarone  for now with AF on Loop > suspect may be driven by hypoxia.  Would like to revisit stopping amiodarone  post ECHO (assessing for LVEF as thought to be down due to tachy-mediated process) -update amiodarone  labs   Secondary Hypercoagulable State  Hx of LV Thrombus  Remote thrombus in setting of AMI, on eliquis  for at least 3 years -continue Eliquis  5mg  BID, dose reviewed and appropriate by age / wt   Chronic Combined Systolic & Diastolic HF  -euvolemic on exam   -GDMT per AHF Team  -recent drop in EF thought due to AFL, plan for ECHO in August to review EF, if remains below 35% will need to consider ICD candidacy   -follow up with Dr. Kennyth one week post ECHO  CAD s/p CABG, PCI  HLD  PAD/PVD -per Cardiology  -plavix , atorvastatin , zetia   Suspected COPD  Tobacco Abuse  Suspected Chronic Hypoxia  -saturations in clinic  today 78-80% even after resting  -daughter reports this has been longstanding / he is not in distress but discussed going to the ER as he needs oxygen.  Patient refuses to go to the ER.  He has an appt with Pulmonary in August with Dr. Neda.  Will reach out to pulmonary to see if we can get him in sooner.  -discussed risks of not going to the ER in the setting of hypoxia, pt not willing to go. Offered ambulance.   -daughter reports frequent syncope at home > suspect this may be hypoxia  -smoking cessation encouraged   Blood in Stool  -referral to GI  -Hgb stable on most recent labs -update CBC with amio labs  Follow up with Dr. Kennyth post ECHO as planned 8/27, appt 08/18/24   Signed, Daphne Barrack, NP-C, AGACNP-BC [facility] - Electrophysiology  06/26/2024, 11:00 AM

## 2024-06-23 ENCOUNTER — Other Ambulatory Visit (HOSPITAL_COMMUNITY): Payer: Self-pay | Admitting: *Deleted

## 2024-06-23 MED ORDER — TORSEMIDE 20 MG PO TABS: 40.0000 mg | ORAL_TABLET | Freq: Two times a day (BID) | ORAL | 11 refills | Status: DC

## 2024-06-26 ENCOUNTER — Encounter: Payer: Self-pay | Admitting: Pulmonary Disease

## 2024-06-26 ENCOUNTER — Ambulatory Visit: Attending: Pulmonary Disease | Admitting: Pulmonary Disease

## 2024-06-26 VITALS — BP 120/80 | HR 81 | Ht 69.0 in | Wt 201.4 lb

## 2024-06-26 DIAGNOSIS — I483 Typical atrial flutter: Secondary | ICD-10-CM | POA: Diagnosis present

## 2024-06-26 DIAGNOSIS — I2510 Atherosclerotic heart disease of native coronary artery without angina pectoris: Secondary | ICD-10-CM | POA: Diagnosis present

## 2024-06-26 DIAGNOSIS — K921 Melena: Secondary | ICD-10-CM | POA: Insufficient documentation

## 2024-06-26 DIAGNOSIS — I5042 Chronic combined systolic (congestive) and diastolic (congestive) heart failure: Secondary | ICD-10-CM | POA: Diagnosis present

## 2024-06-26 DIAGNOSIS — D6869 Other thrombophilia: Secondary | ICD-10-CM | POA: Insufficient documentation

## 2024-06-26 NOTE — Patient Instructions (Signed)
 Medication Instructions:  Your physician recommends that you continue on your current medications as directed. Please refer to the Current Medication list given to you today.  *If you need a refill on your cardiac medications before your next appointment, please call your pharmacy*  Lab Work: CMET, TSH, FreeT4, CBC-TODAY If you have labs (blood work) drawn today and your tests are completely normal, you will receive your results only by: MyChart Message (if you have MyChart) OR A paper copy in the mail If you have any lab test that is abnormal or we need to change your treatment, we will call you to review the results.  Testing: Your physician has requested that you have an echocardiogram mid to end of August. Echocardiography is a painless test that uses sound waves to create images of your heart. It provides your doctor with information about the size and shape of your heart and how well your heart's chambers and valves are working. This procedure takes approximately one hour. There are no restrictions for this procedure. Please do NOT wear cologne, perfume, aftershave, or lotions (deodorant is allowed). Please arrive 15 minutes prior to your appointment time.  Please note: We ask at that you not bring children with you during ultrasound (echo/ vascular) testing. Due to room size and safety concerns, children are not allowed in the ultrasound rooms during exams. Our front office staff cannot provide observation of children in our lobby area while testing is being conducted. An adult accompanying a patient to their appointment will only be allowed in the ultrasound room at the discretion of the ultrasound technician under special circumstances. We apologize for any inconvenience.   Follow-Up: At [facility], you and your health needs are our priority.  As part of our continuing mission to provide you with exceptional heart care, our providers are all part of one team.  This team  includes your primary Cardiologist (physician) and Advanced Practice Providers or APPs (Physician Assistants and Nurse Practitioners) who all work together to provide you with the care you need, when you need it.  Your next appointment:   After echo  Provider:   Fonda Kitty, MD    You have been referred to gastroenterology.

## 2024-06-27 ENCOUNTER — Other Ambulatory Visit: Payer: Self-pay | Admitting: Cardiology

## 2024-06-27 ENCOUNTER — Encounter: Payer: Self-pay | Admitting: Gastroenterology

## 2024-06-27 ENCOUNTER — Ambulatory Visit: Payer: Self-pay | Admitting: Pulmonary Disease

## 2024-06-27 LAB — CBC
Hematocrit: 37.6 % (ref 37.5–51.0)
Hemoglobin: 11.3 g/dL — ABNORMAL LOW (ref 13.0–17.7)
MCH: 27.2 pg (ref 26.6–33.0)
MCHC: 30.1 g/dL — ABNORMAL LOW (ref 31.5–35.7)
MCV: 90 fL (ref 79–97)
Platelets: 234 x10E3/uL (ref 150–450)
RBC: 4.16 x10E6/uL (ref 4.14–5.80)
RDW: 16.4 % — ABNORMAL HIGH (ref 11.6–15.4)
WBC: 10 x10E3/uL (ref 3.4–10.8)

## 2024-06-27 LAB — T4, FREE: Free T4: 1.44 ng/dL (ref 0.82–1.77)

## 2024-06-27 LAB — COMPREHENSIVE METABOLIC PANEL WITH GFR
ALT: 27 IU/L (ref 0–44)
AST: 29 IU/L (ref 0–40)
Albumin: 4 g/dL (ref 3.8–4.9)
Alkaline Phosphatase: 219 IU/L — ABNORMAL HIGH (ref 44–121)
BUN/Creatinine Ratio: 15 (ref 9–20)
BUN: 20 mg/dL (ref 6–24)
Bilirubin Total: 0.7 mg/dL (ref 0.0–1.2)
CO2: 21 mmol/L (ref 20–29)
Calcium: 8.8 mg/dL (ref 8.7–10.2)
Chloride: 92 mmol/L — ABNORMAL LOW (ref 96–106)
Creatinine, Ser: 1.32 mg/dL — ABNORMAL HIGH (ref 0.76–1.27)
Globulin, Total: 3.3 g/dL (ref 1.5–4.5)
Glucose: 297 mg/dL — ABNORMAL HIGH (ref 70–99)
Potassium: 4.5 mmol/L (ref 3.5–5.2)
Sodium: 134 mmol/L (ref 134–144)
Total Protein: 7.3 g/dL (ref 6.0–8.5)
eGFR: 63 mL/min/1.73 (ref >59)

## 2024-06-27 LAB — TSH: TSH: 2.22 u[IU]/mL (ref 0.450–4.500)

## 2024-07-07 ENCOUNTER — Telehealth (HOSPITAL_COMMUNITY): Payer: Self-pay | Admitting: Cardiology

## 2024-07-07 NOTE — Telephone Encounter (Signed)
 Called to confirm/remind patient of their appointment at the [facility] on 07/07/2024.   Appointment:   [] Confirmed  [] Left mess   [] No answer/No voice mail  [x] VM Full/unable to leave message  [] Phone not in service  Patient reminded to bring all medications and/or complete list.  Confirmed patient has transportation. Gave directions, instructed to utilize valet parking.

## 2024-07-08 ENCOUNTER — Ambulatory Visit (HOSPITAL_COMMUNITY)
Admission: RE | Admit: 2024-07-08 | Discharge: 2024-07-08 | Disposition: A | Discharge status: Home / Self Care | Source: Ambulatory Visit | Attending: Cardiology | Admitting: Cardiology

## 2024-07-08 ENCOUNTER — Encounter (HOSPITAL_COMMUNITY): Payer: Self-pay | Admitting: Cardiology

## 2024-07-08 VITALS — BP 120/74 | HR 75 | Ht 69.0 in | Wt 205.4 lb

## 2024-07-08 DIAGNOSIS — I6523 Occlusion and stenosis of bilateral carotid arteries: Secondary | ICD-10-CM | POA: Diagnosis not present

## 2024-07-08 DIAGNOSIS — Z79899 Other long term (current) drug therapy: Secondary | ICD-10-CM

## 2024-07-08 DIAGNOSIS — I4891 Unspecified atrial fibrillation: Secondary | ICD-10-CM | POA: Diagnosis present

## 2024-07-08 DIAGNOSIS — Z95820 Peripheral vascular angioplasty status with implants and grafts: Secondary | ICD-10-CM | POA: Insufficient documentation

## 2024-07-08 DIAGNOSIS — Z955 Presence of coronary angioplasty implant and graft: Secondary | ICD-10-CM | POA: Diagnosis not present

## 2024-07-08 DIAGNOSIS — Z951 Presence of aortocoronary bypass graft: Secondary | ICD-10-CM | POA: Insufficient documentation

## 2024-07-08 DIAGNOSIS — Z7902 Long term (current) use of antithrombotics/antiplatelets: Secondary | ICD-10-CM | POA: Diagnosis not present

## 2024-07-08 DIAGNOSIS — I2510 Atherosclerotic heart disease of native coronary artery without angina pectoris: Secondary | ICD-10-CM

## 2024-07-08 DIAGNOSIS — I480 Paroxysmal atrial fibrillation: Secondary | ICD-10-CM

## 2024-07-08 DIAGNOSIS — I236 Thrombosis of atrium, auricular appendage, and ventricle as current complications following acute myocardial infarction: Secondary | ICD-10-CM | POA: Diagnosis not present

## 2024-07-08 DIAGNOSIS — E785 Hyperlipidemia, unspecified: Secondary | ICD-10-CM | POA: Insufficient documentation

## 2024-07-08 DIAGNOSIS — I483 Typical atrial flutter: Secondary | ICD-10-CM | POA: Diagnosis not present

## 2024-07-08 DIAGNOSIS — R570 Cardiogenic shock: Secondary | ICD-10-CM | POA: Diagnosis present

## 2024-07-08 DIAGNOSIS — I5022 Chronic systolic (congestive) heart failure: Secondary | ICD-10-CM | POA: Diagnosis not present

## 2024-07-08 DIAGNOSIS — I4892 Unspecified atrial flutter: Secondary | ICD-10-CM | POA: Diagnosis not present

## 2024-07-08 DIAGNOSIS — I513 Intracardiac thrombosis, not elsewhere classified: Secondary | ICD-10-CM | POA: Diagnosis not present

## 2024-07-08 DIAGNOSIS — Z7901 Long term (current) use of anticoagulants: Secondary | ICD-10-CM | POA: Diagnosis not present

## 2024-07-08 DIAGNOSIS — I739 Peripheral vascular disease, unspecified: Secondary | ICD-10-CM | POA: Insufficient documentation

## 2024-07-08 MED ORDER — SACUBITRIL-VALSARTAN 24-26 MG PO TABS: 1.0000 | ORAL_TABLET | Freq: Two times a day (BID) | ORAL | 11 refills | Status: DC

## 2024-07-08 NOTE — Progress Notes (Signed)
 ADVANCED HEART FAILURE FOLLOW UP CLINIC NOTE  Referring Physician: [name], MD  Primary Care: [name], MD Primary Cardiologist:  HPI: Ryan [name] is a 58 y.o. male with a PMH of CAD with multiple PCI and CABG, PAD, HLD, LV apical thrombus, carotid artery disease, tobacco use who presents for follow up of recent hospitalization for cardiogenic shock and afib.      Patient has had extensive history of atherosclerotic disease.  She is had CABG in 2021, multiple PCI including PCI to a vein graft to the RCA he also underwent aortobifem bypass in 2014, left carotid to subclavian bypass in 2014, right femoral to popliteal bypass and right femoral endarterectomy, angioplasty to the right CFA, last in 2024, and left femoral endarterectomy in 2024.     SUBJECTIVE:  Overall reports that he is feeling fair, is having some issues with his oxygen levels dropping with activity.  Was recently seen in electrophysiology clinic with sats in the 70s and 80s.  Sats are normal in clinic today.  Denies any low blood pressures, rare lightheadedness with standing.  Does have some lower extremity swelling today.  Was also noted to have some rare episodes of atrial fibrillation on his loop recorder recently.  Denies any active chest pain or orthopnea.  PMH, current medications, allergies, social history, and family history reviewed in epic.  PHYSICAL EXAM: Vitals:   07/08/24 0855  BP: 120/74  Pulse: 75  SpO2: 91%    GENERAL: Chronically ill-appearing PULM:  Normal work of breathing, clear to auscultation bilaterally. Respirations are unlabored.  CARDIAC:  JVP: mildly elevated         Tachycardic with irregular rhythm, systolic murmur, 1+ LE edema. Warm and well perfused extremities. ABDOMEN: Soft, non-tender, mildly distended NEUROLOGIC: Patient is oriented x3 with no focal or lateralizing neurologic deficits.    DATA REVIEW  ECG: 03/13/24: Aflutter with variable block    ECHO: TTE  06/21/23: EF 35-40, Gr 2 DD, NL RVSF, mil dLAE, mild MR, mild AI, AV sclerosis, RAP 8, ant-apical AK  TTE 03/02/24: LVEF 25-30%, reigonal WMA, moderately reduced RV function   CATH: LHC 03/14/2023 ([facility]): LAD proximal/mid stent patent with 50 ISR, LIMA-LAD atretic, D1 jailed, SVG-D1 patent, LCx distal 100, RCA mid tandem 90, 80, SVG-distal RCA stent with 90 ISR      ASSESSMENT & PLAN:  Chronic systolic heart failure: Ischemic related with recent worsening in the setting of atrial flutter. S/p flutter ablation, though having rare episodes of fib.  Continue current diuretic dose, given that he is in normal sinus rhythm will stop digoxin  and can start back home Entresto  24/26.  Holding off on Farxiga  given uncontrolled blood sugars noted on labs recently.  - Patient not a candidate for advanced therapies given severe PAD - Continue torsemide  40 mg twice daily - Recent labs from EP clinic appointment reviewed - Continue spironolactone  25mg  daily - Stop digoxin  0.125mg  daily - Start Entresto  24/26 mg twice daily -Repeat echocardiogram scheduled for later in the month - Continue Kcl 20mEq BID  Atrial flutter: Recurrent, s/p atrial flutter ablation with Dr. Kennyth. Loop recorder implanted to evaluate for atrial fibrillation. - Continue amiodarone  200 mg daily given atrial fibrillation, however given his recurrent hypoxia may benefit from alternative medication strategy.  Will discuss with EP. -Recent thyroid labs and LFTs reviewed - Pulmonary appointment for sleep apnea given that he does not qualify for home sleep study by insurance. Also likely has element of COPD - Increase  diuretics as above - Continue eliquis  5mg  BID  CAD: Currently on triple therapy, extremely high risk but no recent interventions.  Had still been taking aspirin .  Will discontinue today. - Stop aspirin  - Continue plavix , apixaban  - Continue atorvastatin , repatha , LDL at goal  PAD: Extensive history, carotid  as well as femoral disease.  - Not a candidate for advanced therapies - Continue eliquis , plavix , aggressive risk factor modification - Smoking cessation encouraged  Follow up in 3 months  Morene Brownie, MD Advanced Heart Failure Mechanical Circulatory Support 07/08/24

## 2024-07-08 NOTE — Patient Instructions (Signed)
 STOP Asprin  STOP Digoxin   START Entresto  24/26 mg Twice daily   Your physician recommends that you schedule a follow-up appointment in: 3 months ( November) ** PLEASE CALL THE OFFICE IN SEPTEMBER TO ARRANGE YOUR FOLLOW UP APPOINTMENT.**  If you have any questions or concerns before your next appointment please send us  a message through [place] or call our office at [phone].    TO LEAVE A MESSAGE FOR THE NURSE SELECT OPTION 2, PLEASE LEAVE A MESSAGE INCLUDING: YOUR NAME DATE OF BIRTH CALL BACK NUMBER REASON FOR CALL**this is important as we prioritize the call backs  YOU WILL RECEIVE A CALL BACK THE SAME DAY AS LONG AS YOU CALL BEFORE 4:00 PM  At the [facility], you and your health needs are our priority. As part of our continuing mission to provide you with exceptional heart care, we have created designated Provider Care Teams. These Care Teams include your primary Cardiologist (physician) and Advanced Practice Providers (APPs- Physician Assistants and Nurse Practitioners) who all work together to provide you with the care you need, when you need it.   You may see any of the following providers on your designated Care Team at your next follow up: Dr Toribio Fuel Dr Ezra Shuck Dr. Ria Commander Dr. Morene Brownie Amy Lenetta, NP Caffie Shed, [place] [facility] [place], [place] Beckey Coe, NP [place] Lee, NP Ellouise Class, NP Tinnie Redman, PharmD Jaun Bash, PharmD   Please be sure to bring in all your medications bottles to every appointment.    Thank you for choosing  [facility]

## 2024-07-10 ENCOUNTER — Ambulatory Visit

## 2024-07-10 DIAGNOSIS — I483 Typical atrial flutter: Secondary | ICD-10-CM

## 2024-07-11 LAB — CUP PACEART REMOTE DEVICE CHECK
Date Time Interrogation Session: 20250808030600
Implantable Pulse Generator Implant Date: 20250605
Pulse Gen Serial Number: 148873

## 2024-07-16 ENCOUNTER — Ambulatory Visit: Payer: Self-pay | Admitting: Cardiology

## 2024-07-17 ENCOUNTER — Inpatient Hospital Stay (HOSPITAL_COMMUNITY)
Admission: EM | Admit: 2024-07-17 | Discharge: 2024-07-20 | DRG: 291 | Disposition: A | Discharge status: Home / Self Care | Attending: Cardiology | Admitting: Cardiology

## 2024-07-17 ENCOUNTER — Other Ambulatory Visit: Payer: Self-pay

## 2024-07-17 ENCOUNTER — Encounter (HOSPITAL_COMMUNITY): Payer: Self-pay

## 2024-07-17 ENCOUNTER — Emergency Department (HOSPITAL_COMMUNITY)

## 2024-07-17 DIAGNOSIS — D509 Iron deficiency anemia, unspecified: Secondary | ICD-10-CM | POA: Diagnosis present

## 2024-07-17 DIAGNOSIS — N1832 Chronic kidney disease, stage 3b: Secondary | ICD-10-CM | POA: Diagnosis present

## 2024-07-17 DIAGNOSIS — J9611 Chronic respiratory failure with hypoxia: Secondary | ICD-10-CM | POA: Insufficient documentation

## 2024-07-17 DIAGNOSIS — I130 Hypertensive heart and chronic kidney disease with heart failure and stage 1 through stage 4 chronic kidney disease, or unspecified chronic kidney disease: Principal | ICD-10-CM | POA: Diagnosis present

## 2024-07-17 DIAGNOSIS — I959 Hypotension, unspecified: Secondary | ICD-10-CM | POA: Diagnosis present

## 2024-07-17 DIAGNOSIS — N179 Acute kidney failure, unspecified: Secondary | ICD-10-CM | POA: Insufficient documentation

## 2024-07-17 DIAGNOSIS — E876 Hypokalemia: Secondary | ICD-10-CM | POA: Diagnosis present

## 2024-07-17 DIAGNOSIS — I4892 Unspecified atrial flutter: Secondary | ICD-10-CM | POA: Diagnosis present

## 2024-07-17 DIAGNOSIS — E785 Hyperlipidemia, unspecified: Secondary | ICD-10-CM | POA: Insufficient documentation

## 2024-07-17 DIAGNOSIS — J432 Centrilobular emphysema: Secondary | ICD-10-CM | POA: Diagnosis present

## 2024-07-17 DIAGNOSIS — F17210 Nicotine dependence, cigarettes, uncomplicated: Secondary | ICD-10-CM | POA: Diagnosis present

## 2024-07-17 DIAGNOSIS — E1165 Type 2 diabetes mellitus with hyperglycemia: Secondary | ICD-10-CM | POA: Diagnosis present

## 2024-07-17 DIAGNOSIS — N189 Chronic kidney disease, unspecified: Secondary | ICD-10-CM | POA: Insufficient documentation

## 2024-07-17 DIAGNOSIS — E782 Mixed hyperlipidemia: Secondary | ICD-10-CM | POA: Diagnosis present

## 2024-07-17 DIAGNOSIS — I5023 Acute on chronic systolic (congestive) heart failure: Secondary | ICD-10-CM | POA: Diagnosis present

## 2024-07-17 DIAGNOSIS — Z833 Family history of diabetes mellitus: Secondary | ICD-10-CM

## 2024-07-17 DIAGNOSIS — I2489 Other forms of acute ischemic heart disease: Secondary | ICD-10-CM | POA: Diagnosis present

## 2024-07-17 DIAGNOSIS — I480 Paroxysmal atrial fibrillation: Secondary | ICD-10-CM | POA: Diagnosis present

## 2024-07-17 DIAGNOSIS — J449 Chronic obstructive pulmonary disease, unspecified: Secondary | ICD-10-CM | POA: Diagnosis present

## 2024-07-17 DIAGNOSIS — R55 Syncope and collapse: Secondary | ICD-10-CM | POA: Diagnosis present

## 2024-07-17 DIAGNOSIS — I255 Ischemic cardiomyopathy: Secondary | ICD-10-CM | POA: Diagnosis present

## 2024-07-17 DIAGNOSIS — E1122 Type 2 diabetes mellitus with diabetic chronic kidney disease: Secondary | ICD-10-CM | POA: Diagnosis present

## 2024-07-17 DIAGNOSIS — K219 Gastro-esophageal reflux disease without esophagitis: Secondary | ICD-10-CM | POA: Diagnosis present

## 2024-07-17 DIAGNOSIS — Z951 Presence of aortocoronary bypass graft: Secondary | ICD-10-CM

## 2024-07-17 DIAGNOSIS — J9601 Acute respiratory failure with hypoxia: Secondary | ICD-10-CM | POA: Diagnosis present

## 2024-07-17 DIAGNOSIS — Z7901 Long term (current) use of anticoagulants: Secondary | ICD-10-CM

## 2024-07-17 DIAGNOSIS — Z825 Family history of asthma and other chronic lower respiratory diseases: Secondary | ICD-10-CM

## 2024-07-17 DIAGNOSIS — Z885 Allergy status to narcotic agent status: Secondary | ICD-10-CM

## 2024-07-17 DIAGNOSIS — Z7902 Long term (current) use of antithrombotics/antiplatelets: Secondary | ICD-10-CM

## 2024-07-17 DIAGNOSIS — I5021 Acute systolic (congestive) heart failure: Secondary | ICD-10-CM | POA: Diagnosis not present

## 2024-07-17 DIAGNOSIS — E1151 Type 2 diabetes mellitus with diabetic peripheral angiopathy without gangrene: Secondary | ICD-10-CM | POA: Diagnosis present

## 2024-07-17 DIAGNOSIS — I10 Essential (primary) hypertension: Secondary | ICD-10-CM | POA: Diagnosis present

## 2024-07-17 DIAGNOSIS — I2510 Atherosclerotic heart disease of native coronary artery without angina pectoris: Secondary | ICD-10-CM | POA: Diagnosis present

## 2024-07-17 DIAGNOSIS — Z888 Allergy status to other drugs, medicaments and biological substances status: Secondary | ICD-10-CM

## 2024-07-17 DIAGNOSIS — I739 Peripheral vascular disease, unspecified: Secondary | ICD-10-CM | POA: Diagnosis present

## 2024-07-17 DIAGNOSIS — I509 Heart failure, unspecified: Secondary | ICD-10-CM

## 2024-07-17 DIAGNOSIS — I252 Old myocardial infarction: Secondary | ICD-10-CM

## 2024-07-17 DIAGNOSIS — Z955 Presence of coronary angioplasty implant and graft: Secondary | ICD-10-CM

## 2024-07-17 DIAGNOSIS — Z87442 Personal history of urinary calculi: Secondary | ICD-10-CM

## 2024-07-17 DIAGNOSIS — I493 Ventricular premature depolarization: Secondary | ICD-10-CM | POA: Diagnosis present

## 2024-07-17 DIAGNOSIS — Z8674 Personal history of sudden cardiac arrest: Secondary | ICD-10-CM

## 2024-07-17 DIAGNOSIS — Z794 Long term (current) use of insulin: Secondary | ICD-10-CM

## 2024-07-17 DIAGNOSIS — Z79899 Other long term (current) drug therapy: Secondary | ICD-10-CM

## 2024-07-17 DIAGNOSIS — E118 Type 2 diabetes mellitus with unspecified complications: Secondary | ICD-10-CM

## 2024-07-17 LAB — TROPONIN I (HIGH SENSITIVITY)
Troponin I (High Sensitivity): 156 ng/L (ref <18)
Troponin I (High Sensitivity): 150 ng/L (ref <18)

## 2024-07-17 LAB — BRAIN NATRIURETIC PEPTIDE: B Natriuretic Peptide: 1122.1 pg/mL — ABNORMAL HIGH (ref 0.0–100.0)

## 2024-07-17 LAB — CBC
HCT: 34.5 % — ABNORMAL LOW (ref 39.0–52.0)
Hemoglobin: 10.4 g/dL — ABNORMAL LOW (ref 13.0–17.0)
MCH: 24.4 pg — ABNORMAL LOW (ref 26.0–34.0)
MCHC: 30.1 g/dL (ref 30.0–36.0)
MCV: 80.8 fL (ref 80.0–100.0)
Platelets: 288 K/uL (ref 150–400)
RBC: 4.27 MIL/uL (ref 4.22–5.81)
RDW: 20 % — ABNORMAL HIGH (ref 11.5–15.5)
WBC: 6.8 K/uL (ref 4.0–10.5)
nRBC: 0.6 % — ABNORMAL HIGH (ref 0.0–0.2)

## 2024-07-17 LAB — MAGNESIUM: Magnesium: 1.9 mg/dL (ref 1.7–2.4)

## 2024-07-17 LAB — BASIC METABOLIC PANEL WITH GFR
Anion gap: 12 (ref 5–15)
BUN: 31 mg/dL — ABNORMAL HIGH (ref 6–20)
CO2: 28 mmol/L (ref 22–32)
Calcium: 8.6 mg/dL — ABNORMAL LOW (ref 8.9–10.3)
Chloride: 91 mmol/L — ABNORMAL LOW (ref 98–111)
Creatinine, Ser: 2.05 mg/dL — ABNORMAL HIGH (ref 0.61–1.24)
GFR, Estimated: 37 mL/min — ABNORMAL LOW (ref >60)
Glucose, Bld: 226 mg/dL — ABNORMAL HIGH (ref 70–99)
Potassium: 4.6 mmol/L (ref 3.5–5.1)
Sodium: 131 mmol/L — ABNORMAL LOW (ref 135–145)

## 2024-07-17 LAB — I-STAT CG4 LACTIC ACID, ED
Lactic Acid, Venous: 1.2 mmol/L (ref 0.5–1.9)
Lactic Acid, Venous: 1 mmol/L (ref 0.5–1.9)

## 2024-07-17 LAB — CBG MONITORING, ED
Glucose-Capillary: 320 mg/dL — ABNORMAL HIGH (ref 70–99)
Glucose-Capillary: 131 mg/dL — ABNORMAL HIGH (ref 70–99)

## 2024-07-17 MED ORDER — ICOSAPENT ETHYL 1 G PO CAPS
2.0000 g | ORAL_CAPSULE | Freq: Two times a day (BID) | ORAL | Status: DC
  Administered 2024-07-17 – 2024-07-20 (×6): 2 g via ORAL
  Filled 2024-07-17 (×8): qty 2

## 2024-07-17 MED ORDER — PANTOPRAZOLE SODIUM 40 MG PO TBEC
40.0000 mg | DELAYED_RELEASE_TABLET | Freq: Every day | ORAL | Status: DC
  Administered 2024-07-18 – 2024-07-20 (×3): 40 mg via ORAL
  Filled 2024-07-17 (×3): qty 1

## 2024-07-17 MED ORDER — SODIUM CHLORIDE 0.9% FLUSH
3.0000 mL | Freq: Two times a day (BID) | INTRAVENOUS | Status: DC
  Administered 2024-07-17 – 2024-07-20 (×6): 3 mL via INTRAVENOUS

## 2024-07-17 MED ORDER — AMIODARONE HCL 200 MG PO TABS: 200.0000 mg | ORAL_TABLET | Freq: Every day | ORAL | Status: DC

## 2024-07-17 MED ORDER — APIXABAN 5 MG PO TABS: 5.0000 mg | ORAL_TABLET | Freq: Two times a day (BID) | ORAL | Status: DC

## 2024-07-17 MED ORDER — EZETIMIBE 10 MG PO TABS
10.0000 mg | ORAL_TABLET | Freq: Every day | ORAL | Status: DC
Start: 2024-07-18 — End: 2024-07-20
  Administered 2024-07-18 – 2024-07-20 (×3): 10 mg via ORAL
  Filled 2024-07-17 (×4): qty 1

## 2024-07-17 MED ORDER — AMIODARONE HCL 200 MG PO TABS
200.0000 mg | ORAL_TABLET | Freq: Every day | ORAL | Status: DC
  Administered 2024-07-18 – 2024-07-20 (×3): 200 mg via ORAL
  Filled 2024-07-17 (×3): qty 1

## 2024-07-17 MED ORDER — SPIRONOLACTONE 25 MG PO TABS
25.0000 mg | ORAL_TABLET | Freq: Every evening | ORAL | Status: DC
  Filled 2024-07-17: qty 1

## 2024-07-17 MED ORDER — CLOPIDOGREL BISULFATE 75 MG PO TABS
75.0000 mg | ORAL_TABLET | Freq: Every day | ORAL | Status: DC
  Administered 2024-07-18 – 2024-07-20 (×3): 75 mg via ORAL
  Filled 2024-07-17 (×3): qty 1

## 2024-07-17 MED ORDER — INSULIN ASPART 100 UNIT/ML IJ SOLN: 0.0000 [IU] | Freq: Every day | INTRAMUSCULAR | Status: DC

## 2024-07-17 MED ORDER — SODIUM CHLORIDE 0.9 % IV SOLN: 250.0000 mL | INTRAVENOUS | Status: AC | PRN

## 2024-07-17 MED ORDER — ALBUTEROL SULFATE (2.5 MG/3ML) 0.083% IN NEBU: 2.5000 mg | INHALATION_SOLUTION | Freq: Four times a day (QID) | RESPIRATORY_TRACT | Status: DC | PRN

## 2024-07-17 MED ORDER — APIXABAN 5 MG PO TABS
5.0000 mg | ORAL_TABLET | Freq: Two times a day (BID) | ORAL | Status: DC
  Administered 2024-07-17 – 2024-07-20 (×5): 5 mg via ORAL
  Filled 2024-07-17 (×6): qty 1

## 2024-07-17 MED ORDER — INSULIN ASPART 100 UNIT/ML IJ SOLN
3.0000 [IU] | Freq: Three times a day (TID) | INTRAMUSCULAR | Status: DC
  Administered 2024-07-17 – 2024-07-20 (×6): 3 [IU] via SUBCUTANEOUS

## 2024-07-17 MED ORDER — FUROSEMIDE 10 MG/ML IJ SOLN
120.0000 mg | Freq: Two times a day (BID) | INTRAVENOUS | Status: DC
  Administered 2024-07-17 – 2024-07-19 (×5): 120 mg via INTRAVENOUS
  Filled 2024-07-17: qty 2
  Filled 2024-07-17: qty 10
  Filled 2024-07-17: qty 12
  Filled 2024-07-17: qty 10
  Filled 2024-07-17: qty 12
  Filled 2024-07-17: qty 10
  Filled 2024-07-17: qty 12

## 2024-07-17 MED ORDER — SPIRONOLACTONE 25 MG PO TABS: 25.0000 mg | ORAL_TABLET | Freq: Every day | ORAL | Status: DC

## 2024-07-17 MED ORDER — VORTIOXETINE HBR 20 MG PO TABS
20.0000 mg | ORAL_TABLET | Freq: Every day | ORAL | Status: DC
  Administered 2024-07-18 – 2024-07-19 (×2): 20 mg via ORAL
  Filled 2024-07-17 (×5): qty 1

## 2024-07-17 MED ORDER — SPIRONOLACTONE 25 MG PO TABS: 25.0000 mg | ORAL_TABLET | Freq: Every evening | ORAL | Status: DC

## 2024-07-17 MED ORDER — INSULIN ASPART 100 UNIT/ML IJ SOLN
0.0000 [IU] | Freq: Three times a day (TID) | INTRAMUSCULAR | Status: DC
  Administered 2024-07-17: 11 [IU] via SUBCUTANEOUS
  Administered 2024-07-18: 3 [IU] via SUBCUTANEOUS
  Administered 2024-07-18: 8 [IU] via SUBCUTANEOUS
  Administered 2024-07-19: 2 [IU] via SUBCUTANEOUS
  Administered 2024-07-19: 3 [IU] via SUBCUTANEOUS
  Administered 2024-07-19: 11 [IU] via SUBCUTANEOUS

## 2024-07-17 MED ORDER — SODIUM CHLORIDE 0.9% FLUSH
3.0000 mL | INTRAVENOUS | Status: DC | PRN
  Administered 2024-07-18: 3 mL via INTRAVENOUS

## 2024-07-17 MED ORDER — ACETAMINOPHEN 325 MG PO TABS
650.0000 mg | ORAL_TABLET | ORAL | Status: DC | PRN
  Filled 2024-07-17: qty 2

## 2024-07-17 MED ORDER — FUROSEMIDE 10 MG/ML IJ SOLN: 80.0000 mg | Freq: Once | INTRAMUSCULAR | Status: DC

## 2024-07-17 MED ORDER — INSULIN GLARGINE-YFGN 100 UNIT/ML ~~LOC~~ SOLN
25.0000 [IU] | Freq: Two times a day (BID) | SUBCUTANEOUS | Status: DC
  Administered 2024-07-17 – 2024-07-20 (×6): 25 [IU] via SUBCUTANEOUS
  Filled 2024-07-17 (×7): qty 0.25

## 2024-07-17 NOTE — Consult Note (Deleted)
 error

## 2024-07-17 NOTE — ED Provider Notes (Signed)
 [place] EMERGENCY DEPARTMENT AT [facility] Provider Note   CSN: [id number] Arrival date & time: 07/17/24  9040     Patient presents with: Shortness of Breath   Ryan [name] is a 58 y.o. male.  He has significant medical history including hypertension peripheral vascular disease cardiomyopathy coronary disease diabetes.  He is on blood thinners and diuretics.  Saw his cardiologist a few days ago and they told him he should be admitted.  He continues to feel short of breath and has edema of his abdomen and legs.  He has had multiple falls and studies also had a syncopal event.  He said he has been compliant with his medications.  Oxygen saturation 88% on room air on arrival and placed on oxygen.   The history is provided by the patient.  Shortness of Breath Severity:  Moderate Onset quality:  Gradual Duration:  1 month Timing:  Constant Progression:  Worsening Chronicity:  Recurrent Relieved by:  Nothing Worsened by:  Activity Ineffective treatments:  Rest Associated symptoms: cough   Associated symptoms: no abdominal pain, no chest pain, no fever and no hemoptysis   Risk factors: tobacco use        Prior to Admission medications   Medication Sig Start Date End Date Taking? Authorizing Provider  albuterol  (VENTOLIN  HFA) 108 (90 Base) MCG/ACT inhaler Inhale 1-2 puffs into the lungs every 6 (six) hours as needed for wheezing or shortness of breath.    [provider]  amiodarone  (PACERONE ) 200 MG tablet TAKE 1 TABLET BY MOUTH EVERY DAY 06/27/24   [name], MD  cetirizine (ZYRTEC) 10 MG tablet Take 10 mg by mouth daily as needed for allergies. 06/02/21   [provider]  clopidogrel  (PLAVIX ) 75 MG tablet TAKE 1 TABLET BY MOUTH EVERY DAY 05/30/24   Verlin Lonni BIRCH, MD  cyclobenzaprine  (FLEXERIL ) 10 MG tablet Take 1 tablet (10 mg total) by mouth 2 (two) times daily as needed for muscle spasms. 05/21/19   Marcine Catalan M, PA-C  diazepam   (VALIUM ) 10 MG tablet Take 10 mg by mouth at bedtime. 03/10/21   [provider]  ELIQUIS  5 MG TABS tablet TAKE 1 TABLET BY MOUTH 2 TIMES A DAY 04/04/24   Verlin Lonni BIRCH, MD  ezetimibe  (ZETIA ) 10 MG tablet TAKE 1 TABLET BY MOUTH EACH DAY 03/07/24   Weaver, Scott T, PA-C  gabapentin  (NEURONTIN ) 300 MG capsule Take 300 mg by mouth 2 (two) times daily. 03/25/20   [provider]  icosapent  Ethyl (VASCEPA ) 1 g capsule TAKE 2 CAPSULES BY MOUTH TWICE DAILY 05/30/24   Lelon Hamilton T, PA-C  insulin  glargine (LANTUS ) 100 UNIT/ML Solostar Pen Inject 50 Units into the skin 2 (two) times daily.    [provider]  insulin  lispro (HUMALOG ) 100 UNIT/ML injection Inject 5-15 Units into the skin 3 (three) times daily as needed (blood sugar over 150).    [provider]  nitroGLYCERIN  (NITROSTAT ) 0.4 MG SL tablet Place 0.4 mg under the tongue every 5 (five) minutes as needed for chest pain.    [provider]  oxyCODONE -acetaminophen  (PERCOCET) 10-325 MG tablet Take 1 tablet by mouth 4 (four) times daily.    [provider]  pantoprazole  (PROTONIX ) 40 MG tablet Take 40 mg by mouth daily. 03/07/24   [provider]  potassium chloride  SA (KLOR-CON  M) 20 MEQ tablet TAKE 1 TABLET BY MOUTH 2 TIMES A DAY 06/02/24   Marcine Catalan M, PA-C  REPATHA  SURECLICK 140 MG/ML  SOAJ INJECT 140 MG INTO THE SKIN EVERY 14 DAYS AS DIRECTED 05/30/24   [name], MD  sacubitril -valsartan  (ENTRESTO ) 24-26 MG Take 1 tablet by mouth 2 (two) times daily. 07/08/24   Zenaida Morene PARAS, MD  spironolactone  (ALDACTONE ) 25 MG tablet Take 1 tablet (25 mg total) by mouth every evening. 03/06/24   Marcine Catalan M, PA-C  torsemide  (DEMADEX ) 20 MG tablet Take 2 tablets (40 mg total) by mouth 2 (two) times daily. 06/23/24   Zenaida Morene PARAS, MD  traZODone  (DESYREL ) 100 MG tablet Take 100 mg by mouth at bedtime as needed for sleep. Patient not taking: Reported on 07/08/2024 03/07/24    [provider]  TRINTELLIX  20 MG TABS tablet Take 20 mg by mouth daily in the afternoon. 12/29/22   [provider]    Allergies: Metformin and Hydrocodone     Review of Systems  Constitutional:  Negative for fever.  Respiratory:  Positive for cough and shortness of breath. Negative for hemoptysis.   Cardiovascular:  Positive for leg swelling. Negative for chest pain.  Gastrointestinal:  Negative for abdominal pain.    Updated Vital Signs BP (!) 83/54 (BP Location: Left Arm)   Pulse (!) 56   Temp 98 F (36.7 C)   Resp 18   Ht 5' 9 (1.753 m)   Wt 86.6 kg   SpO2 90%   BMI 28.21 kg/m   Physical Exam Vitals and nursing note reviewed.  Constitutional:      General: He is not in acute distress.    Appearance: He is well-developed.  HENT:     Head: Normocephalic and atraumatic.  Eyes:     Conjunctiva/sclera: Conjunctivae normal.  Cardiovascular:     Rate and Rhythm: Normal rate and regular rhythm.     Heart sounds: No murmur heard. Pulmonary:     Effort: Pulmonary effort is normal. No respiratory distress.     Breath sounds: Normal breath sounds.  Chest:     Comments: He has multiple areas of bruising on his chest Abdominal:     Palpations: Abdomen is soft.     Tenderness: There is no abdominal tenderness.  Musculoskeletal:        General: No swelling.     Cervical back: Neck supple.     Right lower leg: No tenderness. Edema present.     Left lower leg: No tenderness. Edema present.  Skin:    General: Skin is warm and dry.     Capillary Refill: Capillary refill takes less than 2 seconds.  Neurological:     Mental Status: He is alert.  Psychiatric:        Mood and Affect: Mood normal.     (all labs ordered are listed, but only abnormal results are displayed) Labs Reviewed  BASIC METABOLIC PANEL WITH GFR - Abnormal; Notable for the following components:      Result Value   Sodium 131 (*)    Chloride 91 (*)    Glucose, Bld 226 (*)    BUN 31  (*)    Creatinine, Ser 2.05 (*)    Calcium  8.6 (*)    GFR, Estimated 37 (*)    All other components within normal limits  CBC - Abnormal; Notable for the following components:   Hemoglobin 10.4 (*)    HCT 34.5 (*)    MCH 24.4 (*)    RDW 20.0 (*)    nRBC 0.6 (*)    All other components within normal limits  BRAIN NATRIURETIC  PEPTIDE - Abnormal; Notable for the following components:   B Natriuretic Peptide 1,122.1 (*)    All other components within normal limits  CBG MONITORING, ED - Abnormal; Notable for the following components:   Glucose-Capillary 320 (*)    All other components within normal limits  TROPONIN I (HIGH SENSITIVITY) - Abnormal; Notable for the following components:   Troponin I (High Sensitivity) 156 (*)    All other components within normal limits  TROPONIN I (HIGH SENSITIVITY) - Abnormal; Notable for the following components:   Troponin I (High Sensitivity) 150 (*)    All other components within normal limits  MAGNESIUM   BASIC METABOLIC PANEL WITH GFR  VITAMIN B12  FOLATE  IRON AND TIBC  FERRITIN  RETICULOCYTES  CBC  I-STAT CG4 LACTIC ACID, ED  I-STAT CG4 LACTIC ACID, ED    EKG: EKG Interpretation Date/Time:  Thursday July 17 2024 10:23:17 EDT Ventricular Rate:  67 PR Interval:  218 QRS Duration:  80 QT Interval:  400 QTC Calculation: 422 R Axis:   109  Text Interpretation: Sinus rhythm with 1st degree A-V block with occasional Premature ventricular complexes Anterolateral infarct , age undetermined ST & T wave abnormality, consider inferior ischemia Abnormal ECG When compared with ECG of 26-Jun-2024 08:23, No significant change since last tracing Confirmed by Towana Sharper [id number]) on 07/17/2024 1:39:12 PM  Radiology: [facility] Chest 2 View Result Date: 07/17/2024 CLINICAL DATA:  57 year old male with shortness of breath. EXAM: CHEST - 2 VIEW COMPARISON:  Cardiac CT 04/18/2024 and earlier. FINDINGS: AP and lateral views 1033 hours. Cardiomegaly. Chronic  CABG, sternotomy. New left chest wall cardiac loop recorder or superficial ICD. Stable cardiac size and mediastinal contours. Visualized tracheal air column is within normal limits. Stable and normal lung volumes. Small volume bilateral layering pleural fluid and tracking into the major fissures, similar to the CT in May. Underlying lung vascularity appears stable to improved from previous exams, no overt edema. No pneumothorax or pleural effusion. No acute osseous abnormality identified. Negative visible bowel gas. IMPRESSION: Cardiomegaly with small bilateral pleural effusions, similar to cardiac CT in May. No overt edema. Electronically Signed   By: VEAR Hurst M.D.   On: 07/17/2024 10:46     .Critical Care  Performed by: Towana Sharper BROCKS, MD Authorized by: Towana Sharper BROCKS, MD   Critical care provider statement:    Critical care time (minutes):  30   Critical care was necessary to treat or prevent imminent or life-threatening deterioration of the following conditions:  Respiratory failure, circulatory failure and cardiac failure   Critical care was time spent personally by me on the following activities:  Development of treatment plan with patient or surrogate, discussions with consultants, evaluation of patient's response to treatment, examination of patient, ordering and review of laboratory studies, ordering and review of radiographic studies, ordering and performing treatments and interventions, pulse oximetry, re-evaluation of patient's condition and review of old charts    Medications Ordered in the ED  furosemide  (LASIX ) 120 mg in dextrose  5 % 50 mL IVPB (0 mg Intravenous Stopped 07/17/24 1654)  clopidogrel  (PLAVIX ) tablet 75 mg (has no administration in time range)  ezetimibe  (ZETIA ) tablet 10 mg (has no administration in time range)  amiodarone  (PACERONE ) tablet 200 mg (has no administration in time range)  spironolactone  (ALDACTONE ) tablet 25 mg (has no administration in time range)   apixaban  (ELIQUIS ) tablet 5 mg (has no administration in time range)  sodium chloride  flush (NS) 0.9 % injection 3 mL (has  no administration in time range)  sodium chloride  flush (NS) 0.9 % injection 3 mL (has no administration in time range)  0.9 %  sodium chloride  infusion (has no administration in time range)  acetaminophen  (TYLENOL ) tablet 650 mg (has no administration in time range)  albuterol  (PROVENTIL ) (2.5 MG/3ML) 0.083% nebulizer solution 2.5 mg (has no administration in time range)  icosapent  Ethyl (VASCEPA ) 1 g capsule 2 g (has no administration in time range)  pantoprazole  (PROTONIX ) EC tablet 40 mg (has no administration in time range)  vortioxetine  HBr (TRINTELLIX ) tablet 20 mg (has no administration in time range)  insulin  glargine-yfgn (SEMGLEE ) injection 25 Units (has no administration in time range)  insulin  aspart (novoLOG ) injection 3 Units (has no administration in time range)  insulin  aspart (novoLOG ) injection 0-15 Units (has no administration in time range)  insulin  aspart (novoLOG ) injection 0-5 Units (has no administration in time range)    Clinical Course as of 07/17/24 1735  Thu Jul 17, 2024  1340 Cardiac echo 3/25 EF of 25% [MB]  1407 Discussed with Trish from cardiology who will have somebody from the cardiology team down to see patient for further recommendations. [MB]  1431 Blood pressure borderline low so have held off on diuresis currently.  Patient is awake and alert. [MB]    Clinical Course User Index [MB] Towana Ozell BROCKS, MD                                 Medical Decision Making Amount and/or Complexity of Data Reviewed Labs: ordered. Radiology: ordered.  Risk Prescription drug management. Decision regarding hospitalization.   This patient complains of shortness of breath leg swelling; this involves an extensive number of treatment Options and is a complaint that carries with it a high risk of complications and morbidity. The  differential includes CHF, COPD, pneumonia, ACS, symptomatic anemia, renal failure  I ordered, reviewed and interpreted labs, which included CBC with mildly low hemoglobin, chemistries with low sodium elevated BUN and creatinine, troponins elevated need to be trended, BNP elevated, lactic acid normal I ordered medication IV Lasix  and reviewed PMP when indicated. I ordered imaging studies which included chest x-ray and I independently    visualized and interpreted imaging which showed cardiomegaly and bilateral effusions Additional history obtained from patient's daughter Previous records obtained and reviewed in epic including recent cardiology notes I consulted cardiology Dr. Zenaida and discussed lab and imaging findings and discussed disposition.  Cardiac monitoring reviewed, sinus rhythm Social determinants considered, tobacco use Critical Interventions: None  After the interventions stated above, I reevaluated the patient and found patient to be requiring oxygen and has signs of fluid overload Admission and further testing considered, patient would benefit from mission to the hospital for IV diuresis and further management.  He is in agreement with plan for admission.      Final diagnoses:  Acute respiratory failure with hypoxia (HCC)  Acute on chronic congestive heart failure, unspecified heart failure type [facility])    ED Discharge Orders     None          Towana Ozell BROCKS, MD 07/17/24 1739

## 2024-07-17 NOTE — ED Notes (Signed)
 Pt at bedside eating. Pt has spironolactone  rx ordered for administration. MD notified that the pt's BP readings have been too low for me to feel comfortable with administering.

## 2024-07-17 NOTE — ED Notes (Signed)
 Pt refused CBG check

## 2024-07-17 NOTE — ED Triage Notes (Signed)
 Pt c.o SOB, hx of CHF and states he has gained 20 lbs in the last month. 88% on room air upon arrival.

## 2024-07-17 NOTE — H&P (Signed)
 Advanced Heart Failure Team History and Physical Note   PCP:  [name], MD  PCP-Cardiology: [name], MD     Reason for Admission:  A/C HFrEF  HPI:   Ryan [name] is a 58 y.o. male with a PMH of CAD with multiple PCI and CABG, PAD, HLD, LV apical thrombus, carotid artery disease and tobacco use.    Patient has had extensive history of atherosclerotic disease. He had CABG in 2021, multiple PCI including PCI to a vein graft to the RCA he also underwent aortobifem bypass in 2014, left carotid to subclavian bypass in 2014, right femoral to popliteal bypass and right femoral endarterectomy, angioplasty to the right CFA, last in 2024, and left femoral endarterectomy in 2024.    He was seen in AHF clinic last week, was doing fairly well, Digoxin  stopped, Entresto  was added.    Today he presented to the ED with increased SOB, BLE edema, weight gain and syncope. He reports over the last week all of those symptoms have progressively worsened. He reports syncopal events associated with coughing spells. He reports being unable to stop coughing and finding himself on the floor shortly after. Reports intt lightheadedness. He continues to smoke 1/2 PPD and drinks beer occasionally. Reports compliance with all medications.    Admission labs/tests reviewed: Na 131, SCr 2.05, K 4.6, BNP 1.1K, lactic acid 1.2, Hgb 10.4 (has been steadily down trending), CXR with bilateral pleural effusions. He was resting comfortably in bed. Denies current chest pain or SOB.    Home Medications Prior to Admission medications   Medication Sig Start Date End Date Taking? Authorizing Provider  albuterol  (VENTOLIN  HFA) 108 (90 Base) MCG/ACT inhaler Inhale 1-2 puffs into the lungs every 6 (six) hours as needed for wheezing or shortness of breath.    [provider]  amiodarone  (PACERONE ) 200 MG tablet TAKE 1 TABLET BY MOUTH EVERY DAY 06/27/24   [name], MD  cetirizine (ZYRTEC) 10 MG tablet Take  10 mg by mouth daily as needed for allergies. 06/02/21   [provider]  clopidogrel  (PLAVIX ) 75 MG tablet TAKE 1 TABLET BY MOUTH EVERY DAY 05/30/24   Cash Lonni BIRCH, MD  cyclobenzaprine  (FLEXERIL ) 10 MG tablet Take 1 tablet (10 mg total) by mouth 2 (two) times daily as needed for muscle spasms. 05/21/19   Marcine Catalan M, PA-C  diazepam  (VALIUM ) 10 MG tablet Take 10 mg by mouth at bedtime. 03/10/21   [provider]  ELIQUIS  5 MG TABS tablet TAKE 1 TABLET BY MOUTH 2 TIMES A DAY 04/04/24   Cash Lonni BIRCH, MD  ezetimibe  (ZETIA ) 10 MG tablet TAKE 1 TABLET BY MOUTH EACH DAY 03/07/24   Weaver, Scott T, PA-C  gabapentin  (NEURONTIN ) 300 MG capsule Take 300 mg by mouth 2 (two) times daily. 03/25/20   [provider]  icosapent  Ethyl (VASCEPA ) 1 g capsule TAKE 2 CAPSULES BY MOUTH TWICE DAILY 05/30/24   Lelon Hamilton T, PA-C  insulin  glargine (LANTUS ) 100 UNIT/ML Solostar Pen Inject 50 Units into the skin 2 (two) times daily.    [provider]  insulin  lispro (HUMALOG ) 100 UNIT/ML injection Inject 5-15 Units into the skin 3 (three) times daily as needed (blood sugar over 150).    [provider]  nitroGLYCERIN  (NITROSTAT ) 0.4 MG SL tablet Place 0.4 mg under the tongue every 5 (five) minutes as needed for chest pain.    [provider]  oxyCODONE -acetaminophen  (PERCOCET) 10-325 MG tablet Take 1 tablet by mouth  4 (four) times daily.    [provider]  pantoprazole  (PROTONIX ) 40 MG tablet Take 40 mg by mouth daily. 03/07/24   [provider]  potassium chloride  SA (KLOR-CON  M) 20 MEQ tablet TAKE 1 TABLET BY MOUTH 2 TIMES A DAY 06/02/24   Marcine, Brittainy M, PA-C  REPATHA  SURECLICK 140 MG/ML SOAJ INJECT 140 MG INTO THE SKIN EVERY 14 DAYS AS DIRECTED 05/30/24   [name], MD  spironolactone  (ALDACTONE ) 25 MG tablet Take 1 tablet (25 mg total) by mouth every evening. 03/06/24   Marcine Catalan M, PA-C  traZODone  (DESYREL ) 100  MG tablet Take 100 mg by mouth at bedtime as needed for sleep. Patient not taking: Reported on 07/08/2024 03/07/24   [provider]  TRINTELLIX  20 MG TABS tablet Take 20 mg by mouth daily in the afternoon. 12/29/22   [provider]    Past Medical History: Past Medical History:  Diagnosis Date   Acute myocardial infarction of other anterior wall, subsequent episode of care 12/12/2011   Anal fissure    Arthritis    back   Atherosclerosis of native artery of both lower extremities with intermittent claudication (HCC) 04/01/2018   Bruises easily    d/t being on Effient    CAD (coronary artery disease)    A. Acute Ant STEMI 12/12/2011    Cardiomyopathy secondary 12/26/2011   Chronic systolic CHF (congestive heart failure) (HCC)    ischemic CM // Echo 6/21: EF 30-35  //  Echocardiogram 10/21: LV apical thrombus resolved, EF 50-55, ant and apical, ant-lat HK, trivial MR, trivial AI   Chronic total occlusion of artery of the extremities (HCC) 02/26/2012   Claudication (HCC) 12/26/2011   Coronary atherosclerosis of native coronary artery    a. ant STEMI with cardiac arrest 2013 s/p DES to mLAD. b. [place]  10/2016 s/p PTCA to mLAD.   Diabetes mellitus without complication (HCC)    Essential hypertension 10/23/2016   GERD (gastroesophageal reflux disease)    takes tums   History of blood transfusion    no abnormal reaction noted   History of kidney stones    Hypertension    Ischemic cardiomyopathy    a. EF 35% in 02/2012 at time of acute MI, improved to normal on subsequent imaging.   MI (myocardial infarction) (HCC)    AMI 1/13 - complicated by VT/Tosades   Mixed hyperlipidemia    Numbness and tingling of right arm 03/31/2013   Occlusion and stenosis of carotid artery without mention of cerebral infarction 08/26/2012   PAD (peripheral artery disease) (HCC)    followed by Vascular - aortobifem bypass 01/2013 & left carotid-subclavian artery bypass 04/2013   Peripheral vascular disease,  unspecified (HCC) 12/16/2012   PVD (peripheral vascular disease) (HCC) 03/31/2013   Rectal polyp    Subclavian steal syndrome 08/11/2013   Uncontrolled diabetes mellitus 10/23/2016   Wears partial dentures    upper    Past Surgical History: Past Surgical History:  Procedure Laterality Date   A-FLUTTER ABLATION N/A 05/05/2024   Procedure: A-FLUTTER ABLATION;  Surgeon: [name], MD;  Location: [facility] INVASIVE CV LAB;  Service: Cardiovascular;  Laterality: N/A;   ABDOMINAL AORTOGRAM W/LOWER EXTREMITY N/A 06/26/2017   Procedure: Abdominal Aortogram w/Lower Extremity;  Surgeon: Serene Gaile ORN, MD;  Location: [facility] INVASIVE CV LAB;  Service: Cardiovascular;  Laterality: N/A;   ABDOMINAL AORTOGRAM W/LOWER EXTREMITY N/A 06/28/2021   Procedure: ABDOMINAL AORTOGRAM W/LOWER EXTREMITY;  Surgeon: Serene Gaile ORN, MD;  Location: [facility] INVASIVE CV LAB;  Service: Cardiovascular;  Laterality: N/A;   ABDOMINAL AORTOGRAM W/LOWER EXTREMITY N/A 01/02/2023   Procedure: ABDOMINAL AORTOGRAM W/LOWER EXTREMITY;  Surgeon: Serene Gaile ORN, MD;  Location: [facility] INVASIVE CV LAB;  Service: Cardiovascular;  Laterality: N/A;   ANAL FISSURECTOMY     AORTA - BILATERAL FEMORAL ARTERY BYPASS GRAFT N/A 01/16/2013   Procedure: AORTA BIFEMORAL BYPASS GRAFT;  Surgeon: Gaile ORN Serene, MD;  Location: [facility] OR;  Service: Vascular;  Laterality: N/A;   APPENDECTOMY     CARDIAC CATHETERIZATION N/A 10/23/2016   Procedure: Left Heart Cath and Coronary Angiography;  Surgeon: Lonni JONETTA Cash, MD;  Location: [facility] INVASIVE CV LAB;  Service: Cardiovascular;  Laterality: N/A;   CARDIAC CATHETERIZATION N/A 10/23/2016   Procedure: Coronary Balloon Angioplasty;  Surgeon: Lonni JONETTA Cash, MD;  Location: [facility] INVASIVE CV LAB;  Service: Cardiovascular;  Laterality: N/A;   CARDIOVERSION N/A 03/31/2024   Procedure: CARDIOVERSION;  Surgeon: Zenaida Morene PARAS, MD;  Location: [facility] INVASIVE CV LAB;  Service: Cardiovascular;  Laterality: N/A;    CAROTID-SUBCLAVIAN BYPASS GRAFT Left 04/03/2013   Procedure: BYPASS GRAFT CAROTID-SUBCLAVIAN;  Surgeon: Gaile ORN Serene, MD;  Location: [facility] OR;  Service: Vascular;  Laterality: Left;   CIRCUMCISION N/A 12/15/2015   Procedure: CIRCUMCISION ADULT;  Surgeon: Belvie LITTIE Clara, MD;  Location: [facility] ORS;  Service: Urology;  Laterality: N/A;   COLONOSCOPY     CORONARY ANGIOPLASTY     stent placed Dec 12, 2011 and 2018   CORONARY ARTERY BYPASS GRAFT N/A 05/05/2020   Procedure: CORONARY ARTERY BYPASS GRAFTING (CABG) x Three, Using Left internal Mammary Artery and Left Leg greater saphenous vein harvested endoscopically;  Surgeon: Army Dallas NOVAK, MD;  Location: [facility] OR;  Service: Open Heart Surgery;  Laterality: N/A;   CORONARY BALLOON ANGIOPLASTY N/A 05/23/2019   Procedure: CORONARY BALLOON ANGIOPLASTY;  Surgeon: Cash Lonni JONETTA, MD;  Location: [facility] INVASIVE CV LAB;  Service: Cardiovascular;  Laterality: N/A;   CORONARY PRESSURE/FFR STUDY N/A 04/22/2020   Procedure: INTRAVASCULAR PRESSURE WIRE/FFR STUDY;  Surgeon: Cash Lonni JONETTA, MD;  Location: [facility] INVASIVE CV LAB;  Service: Cardiovascular;  Laterality: N/A;   DIAGNOSTIC LAPAROSCOPY     ENDARTERECTOMY FEMORAL Right 03/13/2018   Procedure: FEMORAL ENDARTERECTOMY RIGHT;  Surgeon: Serene Gaile ORN, MD;  Location: [facility] OR;  Service: Vascular;  Laterality: Right;   ENDARTERECTOMY FEMORAL Left 01/26/2023   Procedure: REDO LEFT COMMON FEMORAL ENDARTERECTOMY WITH VPA;  Surgeon: Serene Gaile ORN, MD;  Location: [facility] OR;  Service: Vascular;  Laterality: Left;   FEMORAL-POPLITEAL BYPASS GRAFT Right 03/13/2018   Procedure: FEMORAL-POPLITEAL ARTERY BYPASS WITH NON-REVERSE VEIN RIGHT;  Surgeon: Serene Gaile ORN, MD;  Location: [facility] OR;  Service: Vascular;  Laterality: Right;   GROIN DISSECTION Right 03/13/2018   Procedure: RE-DO COMMON FEMORAL ARTERY EXPOSURE;  Surgeon: Serene Gaile ORN, MD;  Location: [facility] OR;  Service: Vascular;  Laterality: Right;   I & D EXTREMITY Left  04/18/2013   Procedure: debridement of left neck lymphocele;  Surgeon: Gaile ORN Serene, MD;  Location: [facility] OR;  Service: Vascular;  Laterality: Left;  I and D of left neck   I & D EXTREMITY Right 11/21/2014   Procedure: IRRIGATION AND DEBRIDEMENT RIGHT HAND;  Surgeon: Elsie Mussel, MD;  Location: [facility] OR;  Service: Orthopedics;  Laterality: Right;   LEFT HEART CATH AND CORONARY ANGIOGRAPHY N/A 05/23/2019   Procedure: LEFT HEART CATH AND CORONARY ANGIOGRAPHY;  Surgeon: Cash Lonni JONETTA, MD;  Location: [facility] INVASIVE CV LAB;  Service: Cardiovascular;  Laterality: N/A;  LEFT HEART CATH AND CORONARY ANGIOGRAPHY N/A 04/22/2020   Procedure: LEFT HEART CATH AND CORONARY ANGIOGRAPHY;  Surgeon: Verlin Lonni BIRCH, MD;  Location: [facility] INVASIVE CV LAB;  Service: Cardiovascular;  Laterality: N/A;   LEFT HEART CATHETERIZATION WITH CORONARY ANGIOGRAM N/A 12/12/2011   Procedure: LEFT HEART CATHETERIZATION WITH CORONARY ANGIOGRAM;  Surgeon: Lonni BIRCH Verlin, MD;  Location: [facility] CATH LAB;  Service: Cardiovascular;  Laterality: N/A;   LOOP RECORDER INSERTION N/A 05/05/2024   Procedure: LOOP RECORDER INSERTION;  Surgeon: [name], MD;  Location: [facility] INVASIVE CV LAB;  Service: Cardiovascular;  Laterality: N/A;   LOWER EXTREMITY ANGIOGRAM N/A 01/31/2012   Procedure: LOWER EXTREMITY ANGIOGRAM;  Surgeon: Lonni BIRCH Verlin, MD;  Location: [facility] CATH LAB;  Service: Cardiovascular;  Laterality: N/A;   LOWER EXTREMITY ANGIOGRAPHY N/A 04/23/2018   Procedure: LOWER EXTREMITY ANGIOGRAPHY;  Surgeon: Serene Gaile ORN, MD;  [facility]: [facility] INVASIVE CV LAB;  Service: Cardiovascular;  Laterality: N/A;   PATCH ANGIOPLASTY Left 01/26/2023   Procedure: PATCH ANGIOPLASTY USING HEMASHIELD PLATINUM FINESSE;  Surgeon: Serene Gaile ORN, MD;  Location: [facility] OR;  Service: Vascular;  Laterality: Left;   PERCUTANEOUS CORONARY STENT INTERVENTION (PCI-S) Right 12/12/2011   Procedure: PERCUTANEOUS CORONARY STENT INTERVENTION (PCI-S);  Surgeon:  Lonni BIRCH Verlin, MD;  Location: [facility] CATH LAB;  Service: Cardiovascular;  Laterality: Right;   PERIPHERAL VASCULAR BALLOON ANGIOPLASTY Right 06/28/2021   Procedure: PERIPHERAL VASCULAR BALLOON ANGIOPLASTY;  Surgeon: Serene Gaile ORN, MD;  Location: [facility] INVASIVE CV LAB;  Service: Cardiovascular;  Laterality: Right;  common femoral (graft)   PERIPHERAL VASCULAR BALLOON ANGIOPLASTY  01/02/2023   Procedure: PERIPHERAL VASCULAR BALLOON ANGIOPLASTY;  Surgeon: Serene Gaile ORN, MD;  Location: [facility] INVASIVE CV LAB;  Service: Cardiovascular;;   PERIPHERAL VASCULAR INTERVENTION Left 04/23/2018   Procedure: PERIPHERAL VASCULAR INTERVENTION;  Surgeon: Serene Gaile ORN, MD;  Location: [facility] INVASIVE CV LAB;  Service: Cardiovascular;  Laterality: Left;  superficial femoral   REPAIR EXTENSOR TENDON Right 11/21/2014   Procedure: WITH REPAIR/RECONSTRUCTION OF EXTENSOR TENDONS AS NEEDED;  Surgeon: Elsie Mussel, MD;  Location: [facility] OR;  Service: Orthopedics;  Laterality: Right;   TEE WITHOUT CARDIOVERSION N/A 05/05/2020   Procedure: TRANSESOPHAGEAL ECHOCARDIOGRAM (TEE);  Surgeon: Army Dallas NOVAK, MD;  Location: [facility] OR;  Service: Open Heart Surgery;  Laterality: N/A;    Family History:  Family History  Problem Relation Age of Onset   Cancer Father        Lung   Diabetes Mother    COPD Mother    Cancer Maternal Uncle        Colon    Social History: Social History   Socioeconomic History   Marital status: Divorced    Spouse name: Not on file   Number of children: 1   Years of education: Not on file   Highest education level: High school graduate  Occupational History    Employer: OTHER   Occupation: Disablity    Comment: Since 2013  Tobacco Use   Smoking status: Some Days    Current packs/day: 0.25    Average packs/day: 0.3 packs/day for 25.0 years (6.3 ttl pk-yrs)    Types: Cigarettes   Smokeless tobacco: Never   Tobacco comments:    Smoking cessation  Vaping Use   Vaping status: Never Used   Substance and Sexual Activity   Alcohol use: Yes    Alcohol/week: 2.0 standard drinks of alcohol    Types: 2 Cans of beer per week    Comment: socially   Drug use:  No   Sexual activity: Yes  Other Topics Concern   Not on file  Social History Narrative   Disabled - no longer works   Social Drivers of Corporate investment banker Strain: Low Risk  (03/30/2022)   Overall Financial Resource Strain (CARDIA)    Difficulty of Paying Living Expenses: Not very hard  Food Insecurity: No Food Insecurity (03/01/2024)   Hunger Vital Sign    Worried About Running Out of Food in the Last Year: Never true    Ran Out of Food in the Last Year: Never true  Transportation Needs: No Transportation Needs (03/01/2024)   PRAPARE - Administrator, Civil Service (Medical): No    Lack of Transportation (Non-Medical): No  Physical Activity: Not on file  Stress: Not on file  Social Connections: Not on file    Allergies:  Allergies  Allergen Reactions   Metformin Anaphylaxis    Water blisters between fingers   Hydrocodone  Hives and Nausea And Vomiting    Objective:    Vital Signs:   Temp:  [97.9 F (36.6 C)-98 F (36.7 C)] 98 F (36.7 C) (08/14 1308) Pulse Rate:  [56-63] 59 (08/14 1430) Resp:  [18-25] 20 (08/14 1430) BP: (83-100)/(42-61) 99/61 (08/14 1430) SpO2:  [88 %-100 %] 92 % (08/14 1430) Weight:  [86.6 kg] 86.6 kg (08/14 1012)   Filed Weights   07/17/24 1012  Weight: 86.6 kg     Physical Exam   General:  well appearing.  No respiratory difficulty. + Cameron Neck: JVD to ear.  Cor: Regular rate & rhythm. No murmurs. Lungs: clear, diminished bases Abd: Distended Extremities: +2-3 BLE edema to thight Neuro: alert & oriented x 3. Affect pleasant.   Telemetry   NSR 70s + PVCs (Personally reviewed)    EKG   NSR 1AVB, PVCs 67 bpm   Labs     Basic Metabolic Panel: Recent Labs  Lab 07/17/24 1022 07/17/24 1335  NA 131*  --   K 4.6  --   CL 91*  --   CO2 28  --    GLUCOSE 226*  --   BUN 31*  --   CREATININE 2.05*  --   CALCIUM  8.6*  --   MG  --  1.9    Liver Function Tests: No results for input(s): AST, ALT, ALKPHOS, BILITOT, PROT, ALBUMIN  in the last 168 hours. No results for input(s): LIPASE, AMYLASE in the last 168 hours. No results for input(s): AMMONIA in the last 168 hours.  CBC: Recent Labs  Lab 07/17/24 1022  WBC 6.8  HGB 10.4*  HCT 34.5*  MCV 80.8  PLT 288    Cardiac Enzymes: No results for input(s): CKTOTAL, CKMB, CKMBINDEX, TROPONINI in the last 168 hours.  BNP: BNP (last 3 results) Recent Labs    03/01/24 0544 07/17/24 1022  BNP 1,074.8* 1,122.1*    ProBNP (last 3 results) No results for input(s): PROBNP in the last 8760 hours.   CBG: No results for input(s): GLUCAP in the last 168 hours.  Coagulation Studies: No results for input(s): LABPROT, INR in the last 72 hours.  Imaging: DG Chest 2 View Result Date: 07/17/2024 CLINICAL DATA:  58 year old male with shortness of breath. EXAM: CHEST - 2 VIEW COMPARISON:  Cardiac CT 04/18/2024 and earlier. FINDINGS: AP and lateral views 1033 hours. Cardiomegaly. Chronic CABG, sternotomy. New left chest wall cardiac loop recorder or superficial ICD. Stable cardiac size and mediastinal contours. Visualized tracheal air column is within normal  limits. Stable and normal lung volumes. Small volume bilateral layering pleural fluid and tracking into the major fissures, similar to the CT in May. Underlying lung vascularity appears stable to improved from previous exams, no overt edema. No pneumothorax or pleural effusion. No acute osseous abnormality identified. Negative visible bowel gas. IMPRESSION: Cardiomegaly with small bilateral pleural effusions, similar to cardiac CT in May. No overt edema. Electronically Signed   By: VEAR Hurst M.D.   On: 07/17/2024 10:46     Patient Profile  Ryan [name] is a 59 y.o. male with a PMH of CAD with multiple PCI  and CABG, PAD, HLD, LV apical thrombus, carotid artery disease and tobacco use. AHF team to see with A/C HFrEF.   Assessment/Plan   A/C HFrEF: Ischemic related with recent worsening in the setting of atrial flutter. - NYHA IIIb-early IV on admission - Volume overloaded on exam. Start lasix  120 mg IV BID.  - Continue spironolactone  25mg  daily - Stop Entresto , could be contributing to syncope. SBP 80s-90s while in room - Patient not a candidate for advanced therapies given severe PAD - Place UNNA boots   Syncope - Plan on interrogating BSci loop recorder - Stopping Entresto , suspect this has something to do with it as BPs soft - Tele with minimal PVCs noted - Associated with coughing....vagal?   Atrial flutter - Recurrent, s/p atrial flutter ablation with Dr. Kennyth. Loop recorder implanted to evaluate for atrial fibrillation.  - Continue amiodarone  200 mg daily given atrial fibrillation, however given his recurrent hypoxia may benefit from alternative medication strategy.   - Pulmonary appointment for sleep apnea given that he does not qualify for home sleep study by insurance. Also likely has element of COPD - Continue eliquis  5mg  BID   AKI - SCr 2.05 on admission - Baseline SCr ~1.3 - Follow with diuresis - Avoid hypotension   CAD - Continue plavix , apixaban  (confirmed that he took morning doses today) - Continue atorvastatin , repatha , LDL at goal   PAD - Extensive history, carotid as well as femoral disease.  - Not a candidate for advanced therapies - Continue eliquis , plavix , aggressive risk factor modification - Smoking cessation encouraged   Beckey LITTIE Coe, NP 07/17/2024, 3:49 PM  Advanced Heart Failure Team Pager [phone] (M-F; 7a - 5p)  Please contact [facility] for night-coverage after hours (4p -7a ) and weekends on [email or web address]

## 2024-07-17 NOTE — Progress Notes (Signed)
 Heart Failure Navigator Progress Note  Assessed for Heart & Vascular TOC clinic readiness.  Patient does not meet criteria due to Advanced heart Failure Team consult. .   Navigator will sign off at this time.   Stephane Haddock, BSN, Scientist, clinical (histocompatibility and immunogenetics) Only

## 2024-07-17 NOTE — Progress Notes (Signed)
 Loop recorder interrogation with no acute findings to correlate with syncope. Had 2 AF occurrences between 06/09/24-07/11/24. Longest run 32 min.   Avg HR 68 bpm  A fib burden 0 hrs. Total time in AF <1%  PVC burden 2.6%  Ryan [name] Coe AGACNP-BC Advanced Heart Failure Team  07/17/24

## 2024-07-18 ENCOUNTER — Inpatient Hospital Stay (HOSPITAL_COMMUNITY)

## 2024-07-18 DIAGNOSIS — I5023 Acute on chronic systolic (congestive) heart failure: Secondary | ICD-10-CM | POA: Diagnosis not present

## 2024-07-18 DIAGNOSIS — I5021 Acute systolic (congestive) heart failure: Secondary | ICD-10-CM | POA: Diagnosis not present

## 2024-07-18 LAB — RETICULOCYTES
Immature Retic Fract: 38.7 % — ABNORMAL HIGH (ref 2.3–15.9)
RBC.: 4.24 MIL/uL (ref 4.22–5.81)
Retic Count, Absolute: 114.5 K/uL (ref 19.0–186.0)
Retic Ct Pct: 2.7 % (ref 0.4–3.1)

## 2024-07-18 LAB — CBC
HCT: 33.9 % — ABNORMAL LOW (ref 39.0–52.0)
Hemoglobin: 10.1 g/dL — ABNORMAL LOW (ref 13.0–17.0)
MCH: 24.2 pg — ABNORMAL LOW (ref 26.0–34.0)
MCHC: 29.8 g/dL — ABNORMAL LOW (ref 30.0–36.0)
MCV: 81.3 fL (ref 80.0–100.0)
Platelets: 235 K/uL (ref 150–400)
RBC: 4.17 MIL/uL — ABNORMAL LOW (ref 4.22–5.81)
RDW: 20.1 % — ABNORMAL HIGH (ref 11.5–15.5)
WBC: 7.5 K/uL (ref 4.0–10.5)
nRBC: 0.3 % — ABNORMAL HIGH (ref 0.0–0.2)

## 2024-07-18 LAB — IRON AND TIBC
Iron: 25 ug/dL — ABNORMAL LOW (ref 45–182)
Saturation Ratios: 7 % — ABNORMAL LOW (ref 17.9–39.5)
TIBC: 367 ug/dL (ref 250–450)
UIBC: 342 ug/dL

## 2024-07-18 LAB — BASIC METABOLIC PANEL WITH GFR
Anion gap: 9 (ref 5–15)
BUN: 34 mg/dL — ABNORMAL HIGH (ref 6–20)
CO2: 27 mmol/L (ref 22–32)
Calcium: 8 mg/dL — ABNORMAL LOW (ref 8.9–10.3)
Chloride: 95 mmol/L — ABNORMAL LOW (ref 98–111)
Creatinine, Ser: 2.04 mg/dL — ABNORMAL HIGH (ref 0.61–1.24)
GFR, Estimated: 37 mL/min — ABNORMAL LOW (ref >60)
Glucose, Bld: 171 mg/dL — ABNORMAL HIGH (ref 70–99)
Potassium: 4.7 mmol/L (ref 3.5–5.1)
Sodium: 131 mmol/L — ABNORMAL LOW (ref 135–145)

## 2024-07-18 LAB — ECHOCARDIOGRAM COMPLETE
AR max vel: 1.57 cm2
AV Area VTI: 1.6 cm2
AV Area mean vel: 1.42 cm2
AV Mean grad: 11.5 mmHg
AV Peak grad: 18 mmHg
Ao pk vel: 2.12 m/s
Area-P 1/2: 3.77 cm2
Est EF: 45
Height: 69 in
S' Lateral: 3.5 cm
Weight: 3056 [oz_av]

## 2024-07-18 LAB — HEMOGLOBIN A1C
Hgb A1c MFr Bld: 12.3 % — ABNORMAL HIGH (ref 4.8–5.6)
Mean Plasma Glucose: 306.31 mg/dL

## 2024-07-18 LAB — CBG MONITORING, ED
Glucose-Capillary: 177 mg/dL — ABNORMAL HIGH (ref 70–99)
Glucose-Capillary: 212 mg/dL — ABNORMAL HIGH (ref 70–99)

## 2024-07-18 LAB — GLUCOSE, CAPILLARY
Glucose-Capillary: 300 mg/dL — ABNORMAL HIGH (ref 70–99)
Glucose-Capillary: 109 mg/dL — ABNORMAL HIGH (ref 70–99)

## 2024-07-18 LAB — LACTIC ACID, PLASMA
Lactic Acid, Venous: 1.7 mmol/L (ref 0.5–1.9)
Lactic Acid, Venous: 1.7 mmol/L (ref 0.5–1.9)

## 2024-07-18 LAB — FOLATE: Folate: 26.7 ng/mL (ref >5.9)

## 2024-07-18 LAB — VITAMIN B12: Vitamin B-12: 781 pg/mL (ref 180–914)

## 2024-07-18 LAB — FERRITIN: Ferritin: 63 ng/mL (ref 24–336)

## 2024-07-18 MED ORDER — OXYCODONE-ACETAMINOPHEN 5-325 MG PO TABS
1.0000 | ORAL_TABLET | Freq: Four times a day (QID) | ORAL | Status: DC | PRN
  Administered 2024-07-18: 1 via ORAL
  Filled 2024-07-18: qty 1

## 2024-07-18 MED ORDER — POTASSIUM CHLORIDE CRYS ER 20 MEQ PO TBCR
40.0000 meq | EXTENDED_RELEASE_TABLET | Freq: Once | ORAL | Status: AC
  Administered 2024-07-18: 40 meq via ORAL
  Filled 2024-07-18: qty 2

## 2024-07-18 MED ORDER — ACETAZOLAMIDE 250 MG PO TABS
500.0000 mg | ORAL_TABLET | Freq: Once | ORAL | Status: AC
  Administered 2024-07-18: 500 mg via ORAL
  Filled 2024-07-18: qty 2

## 2024-07-18 MED ORDER — OXYCODONE-ACETAMINOPHEN 5-325 MG PO TABS
1.0000 | ORAL_TABLET | Freq: Four times a day (QID) | ORAL | Status: DC | PRN
  Administered 2024-07-18: 2 via ORAL
  Administered 2024-07-18: 1 via ORAL
  Administered 2024-07-19: 2 via ORAL
  Administered 2024-07-19: 1 via ORAL
  Administered 2024-07-19 – 2024-07-20 (×2): 2 via ORAL
  Filled 2024-07-18: qty 2
  Filled 2024-07-18: qty 1
  Filled 2024-07-18 (×3): qty 2
  Filled 2024-07-18: qty 1

## 2024-07-18 MED ORDER — ACETAZOLAMIDE 250 MG PO TABS
500.0000 mg | ORAL_TABLET | Freq: Two times a day (BID) | ORAL | Status: DC
  Administered 2024-07-18 – 2024-07-19 (×3): 500 mg via ORAL
  Filled 2024-07-18 (×5): qty 2

## 2024-07-18 MED ORDER — MAGNESIUM SULFATE 2 GM/50ML IV SOLN
2.0000 g | Freq: Once | INTRAVENOUS | Status: AC
  Administered 2024-07-18: 2 g via INTRAVENOUS
  Filled 2024-07-18: qty 50

## 2024-07-18 MED ORDER — METOLAZONE 5 MG PO TABS
5.0000 mg | ORAL_TABLET | Freq: Once | ORAL | Status: AC
  Administered 2024-07-18: 5 mg via ORAL
  Filled 2024-07-18: qty 1

## 2024-07-18 NOTE — ED Notes (Signed)
 EMT-P will return for med admin after pt finishes meal.

## 2024-07-18 NOTE — ED Notes (Addendum)
 Apixaban  (Eliquis ) tablet 5 mg order for 1000 does not translate to the orders in the Pyxis. Main Pharmacy contacted.

## 2024-07-18 NOTE — Progress Notes (Signed)
 Orthopedic Tech Progress Note Patient Details:  Ryan [name] July 15, 1966 [id number]  Ortho Devices Type of Ortho Device: Radio broadcast assistant Ortho Device/Splint Location: BLE Ortho Device/Splint Interventions: Ordered, Application, Adjustment   Post Interventions Patient Tolerated: Well Instructions Provided: Care of device   Ryan [name] L [name] 07/18/2024, 12:38 AM

## 2024-07-18 NOTE — Inpatient Diabetes Management (Signed)
 Inpatient Diabetes Program Recommendations  AACE/ADA: New Consensus Statement on Inpatient Glycemic Control (2015)  Target Ranges:  Prepandial:   less than 140 mg/dL      Peak postprandial:   less than 180 mg/dL (1-2 hours)      Critically ill patients:  140 - 180 mg/dL   Lab Results  Component Value Date   GLUCAP 212 (H) 07/18/2024   HGBA1C 12.3 (H) 07/18/2024    Review of Glycemic Control  Latest Reference Range & Units 07/17/24 17:25 07/17/24 21:47 07/18/24 08:13 07/18/24 12:14  Glucose-Capillary 70 - 99 mg/dL 679 (H) 868 (H) 822 (H) 212 (H)  (H): Data is abnormally high Diabetes history: Type 2 DM Outpatient Diabetes medications: Lantus  50 units BID, Humalog  5-15 units TID Current orders for Inpatient glycemic control: Semglee  25 units BID, Novolog  3 units TID, Novolog  0-15 units TID & HS  Inpatient Diabetes Program Recommendations:    Spoke with patient regarding outpatient diabetes management. Admits to missing doses of insulin . Reviewed patient's current A1c of 12.3%. Explained what a A1c is and what it measures. Also reviewed goal A1c with patient, importance of good glucose control @ home, and blood sugar goals. Reviewed patho of DM, need for improvement, role of pancreas, role of insulin , vascular changes, impact from cardiac perspective, and other commorbidities.  Patient has a meter. Admits to not using it. Does not seemed engaged in making changes at this time. Discussed CGM and want to think about it. Encouraged for patient to make follow up appointment and can discuss at that time. Also, reviewed importance of taking medications as prescribed and other insulins that may help aide in compliance. Dietitian consult placed. Denies drinking sugary beverages or consuming large amounts of CHO. However, at bedside, patient was drinking regular sodas and had candy. Reviewed importance of protein and overall CHO mindfulness.  No additional questions.  Thanks, Tinnie Minus,  MSN, RNC-OB Diabetes Coordinator [phone] (8a-5p)

## 2024-07-18 NOTE — ED Notes (Signed)
 Phleb stuck twice, no blood

## 2024-07-18 NOTE — Progress Notes (Signed)
 Initial Nutrition Assessment  DOCUMENTATION CODES:   Obesity unspecified  INTERVENTION:  -Continue HH/CM menu, regular texture, thin liquids -Continue 1800 ml fluid restriction -Daily weights   NUTRITION DIAGNOSIS:   Limited adherence to nutrition-related recommendations related to other (see comment) (Not ready to make changes) as evidenced by other (comment) (Per discussion with pt).   GOAL:   Patient will meet greater than or equal to 90% of their needs  MONITOR:   PO intake, Weight trends, Labs, Skin  REASON FOR ASSESSMENT:   Consult Diet education, Assessment of nutrition requirement/status  ASSESSMENT:   Hx CAD with multiple PCI and CABG, PAD, HLD, LV apical thrombus, carotid artery disease and tobacco use. Presented to the ED with progressing fluid overload, SOB.  Spoke to pt at bedside. Attempted to discuss consult for DM2 diet education, A1c 12.3%. Pt not making eye contact, continues to watch TV despite conversation. Pt met with diabetes coordinator earlier today, attempted to illicit recall of information regarding that education. Pt says he was half asleep, could not recall anything and that he has the handouts to look at. Discussed further DM diet education, pt declined need at this time. Left pt with Diabetic MyPlate and Diabetes Carbohydrate Counting handout. Quickly reviewed Diabetic MyPlate concept. Pt denies questions/concerns at this time. Encouraged pt to reach out if questions arise. Pt verbalized understanding. RDN available prn.  Labs Na 131 BG 171-212 BUN 34 Cr 2.04 Calcium  8.0 GFR H/H 10.1/33.9 A1c 12.3  Medications  acetaZOLAMIDE   500 mg Oral BID   amiodarone   200 mg Oral Daily   apixaban   5 mg Oral BID   clopidogrel   75 mg Oral Daily   ezetimibe   10 mg Oral Daily   icosapent  Ethyl  2 g Oral BID   insulin  aspart  0-15 Units Subcutaneous TID WC   insulin  aspart  0-5 Units Subcutaneous QHS   insulin  aspart  3 Units Subcutaneous TID WC    insulin  glargine-yfgn  25 Units Subcutaneous BID   metolazone   5 mg Oral Once   pantoprazole   40 mg Oral Daily   potassium chloride   40 mEq Oral Once   sodium chloride  flush  3 mL Intravenous Q12H   vortioxetine  HBr  20 mg Oral Q1500    Diet Order:   Diet Order             Diet heart healthy/carb modified Room service appropriate? Yes; Fluid consistency: Thin; Fluid restriction: 1800 mL Fluid  Diet effective now                  EDUCATION NEEDS:   Education needs have been addressed (Refused further nutrition ed)  Skin:  Skin Assessment: Reviewed RN Assessment  Last BM:  PTA?  Height:   Ht Readings from Last 1 Encounters:  07/18/24 5' 9 (1.753 m)    Weight:   Wt Readings from Last 1 Encounters:  07/18/24 100.5 kg   BMI:  Body mass index is 32.72 kg/m.  Estimated Nutritional Needs:   Kcal:  2000-2400 kcal  Protein:  110-130 g  Fluid:  1800 ml rest  Daniqua Campoy Daml-Budig, RDN, LDN Registered Dietitian Nutritionist RD Inpatient Contact Info in [place]

## 2024-07-18 NOTE — Progress Notes (Addendum)
 Advanced Heart Failure Rounding Note  Cardiologist: [name], MD  Chief Complaint: A/C HFrEF Subjective:    Admitted yesterday with volume overload. Diursesis started yesterday with 120 IV lasix  BID. No UOP documented yesterday or today. >1L UOP in urinal at bedside.   He has 4 drinks at bedside and bag of bojangles. We discussed limiting PO intake and avoiding eating fast food. Fluid restriction ordered yesterday.   SCr 2.05>2.04  He feels ok. Denies CP. SOB improving.   Objective:   Weight Range: 86.6 kg Body mass index is 28.21 kg/m.   Vital Signs:   Temp:  [97.8 F (36.6 C)-98.6 F (37 C)] 97.8 F (36.6 C) (08/15 0850) Pulse Rate:  [49-62] 62 (08/15 0945) Resp:  [11-27] 21 (08/15 0945) BP: (67-128)/(35-105) 99/51 (08/15 0945) SpO2:  [84 %-99 %] 96 % (08/15 0945)    Weight change: Filed Weights   07/17/24 1012  Weight: 86.6 kg    Intake/Output:   Intake/Output Summary (Last 24 hours) at 07/18/2024 1230 Last data filed at 07/18/2024 0958 Gross per 24 hour  Intake 154.17 ml  Output --  Net 154.17 ml      Physical Exam  General:  well appearing.  No respiratory difficulty Neck: JVD to ear.  Cor: Regular rate & rhythm. No murmurs. Lungs: clear Extremities: +2 BLE edema to thigh Neuro: alert & oriented x 3. Affect pleasant.  Telemetry   NSR 60s (Personally reviewed)    Labs    CBC Recent Labs    07/17/24 1022 07/18/24 0618  WBC 6.8 7.5  HGB 10.4* 10.1*  HCT 34.5* 33.9*  MCV 80.8 81.3  PLT 288 235   Basic Metabolic Panel Recent Labs    91/85/74 1022 07/17/24 1335 07/18/24 0618  NA 131*  --  131*  K 4.6  --  4.7  CL 91*  --  95*  CO2 28  --  27  GLUCOSE 226*  --  171*  BUN 31*  --  34*  CREATININE 2.05*  --  2.04*  CALCIUM  8.6*  --  8.0*  MG  --  1.9  --    Liver Function Tests No results for input(s): AST, ALT, ALKPHOS, BILITOT, PROT, ALBUMIN  in the last 72 hours. No results for input(s): LIPASE,  AMYLASE in the last 72 hours. Cardiac Enzymes No results for input(s): CKTOTAL, CKMB, CKMBINDEX, TROPONINI in the last 72 hours.  BNP: BNP (last 3 results) Recent Labs    03/01/24 0544 07/17/24 1022  BNP 1,074.8* 1,122.1*    ProBNP (last 3 results) No results for input(s): PROBNP in the last 8760 hours.   D-Dimer No results for input(s): DDIMER in the last 72 hours. Hemoglobin A1C Recent Labs    07/18/24 0618  HGBA1C 12.3*   Fasting Lipid Panel No results for input(s): CHOL, HDL, LDLCALC, TRIG, CHOLHDL, LDLDIRECT in the last 72 hours. Thyroid Function Tests No results for input(s): TSH, T4TOTAL, T3FREE, THYROIDAB in the last 72 hours.  Invalid input(s): FREET3  Other results:   Imaging    No results found.   Medications:     Scheduled Medications:  acetaZOLAMIDE   500 mg Oral BID   amiodarone   200 mg Oral Daily   apixaban   5 mg Oral BID   clopidogrel   75 mg Oral Daily   ezetimibe   10 mg Oral Daily   icosapent  Ethyl  2 g Oral BID   insulin  aspart  0-15 Units Subcutaneous TID WC   insulin  aspart  0-5  Units Subcutaneous QHS   insulin  aspart  3 Units Subcutaneous TID WC   insulin  glargine-yfgn  25 Units Subcutaneous BID   pantoprazole   40 mg Oral Daily   sodium chloride  flush  3 mL Intravenous Q12H   vortioxetine  HBr  20 mg Oral Q1500    Infusions:  sodium chloride      furosemide  Stopped (07/18/24 0958)    PRN Medications: sodium chloride , acetaminophen , albuterol , sodium chloride  flush  Patient Profile   Ryan [name] is a 58 y.o. male with a PMH of CAD with multiple PCI and CABG, PAD, HLD, LV apical thrombus, carotid artery disease and tobacco use. Admitted with A/C HFrEF.   Assessment/Plan  A/C HFrEF: Ischemic related with recent worsening in the setting of atrial flutter. - NYHA IIIb-early IV on admission - Volume overloaded on exam. Continue lasix  120 mg IV BID. Add diamox  500 mg BID - Continue  spironolactone  25mg  daily - Now off Entresto , could be contributing to syncope. SBP 80s-90s while in room - Patient not a candidate for advanced therapies given severe PAD - Continue UNNA boots - Strict I&O, daily weights.    Syncope - BSci loop recorder interrogated 8/14, see additional progress note from 8/14.  - Now off Entresto , suspect this had something to do with it as BPs persistently soft - Tele with minimal PVCs noted - Associated with coughing....suspect vasovagal.    Atrial flutter - Recurrent, s/p atrial flutter ablation with Dr. Kennyth. Loop recorder implanted to evaluate for atrial fibrillation.  - Continue amiodarone  200 mg daily given atrial fibrillation, however given his recurrent hypoxia may benefit from alternative medication strategy.   - Pulmonary appointment for sleep apnea given that he does not qualify for home sleep study by insurance. Also likely has element of COPD - Continue eliquis  5mg  BID   AKI - SCr 2.05 on admission - Baseline SCr ~1.3 - Follow with diuresis, stable 2.04 today - Avoid hypotension   CAD - Continue plavix , apixaban  (confirmed that he took morning doses today) - Continue atorvastatin , repatha , LDL at goal   PAD - Extensive history, carotid as well as femoral disease.  - Not a candidate for advanced therapies - Continue eliquis , plavix , aggressive risk factor modification - Smoking cessation encouraged  7. IDA - Hgb has been progressively down trending - No overt signs of bleeding - May need OP GI workup - tSat 7 anf ferritin 63, will arrange IV iron - Hgb 16.9 5/25> 11.3 7/25> 10.4>10.1 this admission  8. DB II - Uncontrolled, A1c up to 12.3 this admission - Avoid SGLT2i - DB coordinator consulted - SSI ordered.  - Will consult dietary team today for further educations. Has lots of sugary drinks and candy at the bedside.   Length of Stay: 1  Beckey LITTIE Coe, NP  07/18/2024, 12:30 PM  Advanced Heart Failure Team Pager  [phone] (M-F; 7a - 5p)  Please contact [facility] for night-coverage after hours (5p -7a ) and weekends on [email or web address]  Patient seen and examined with the above-signed Advanced Practice Provider and/or Housestaff. I personally reviewed laboratory data, imaging studies and relevant notes. I independently examined the patient and formulated the important aspects of the plan. I have edited the note to reflect any of my changes or salient points. I have personally discussed the plan with the patient and/or family.  Remains on IV lasix . Diuresing well but drinking a lot of fluids. Denies SOB or orthopnea.  General:  Sitting up in bed  No resp difficulty  HEENT: normal Neck: supple. JVP to ear. Carotids 2+ bilat; no bruits. No lymphadenopathy or thryomegaly appreciated. Cor: PMI nondisplaced. Regular rate & rhythm. No rubs, gallops or murmurs. Lungs: clear Abdomen: soft, nontender, nondistended. No hepatosplenomegaly. No bruits or masses. Good bowel sounds. Extremities: no cyanosis, clubbing, rash,2-3+  edema + UNNA Neuro: alert & orientedx3, cranial nerves grossly intact. moves all 4 extremities w/o difficulty. Affect pleasant  He remains markedly overloaded. Continue IV lasix  120 bid + diamox . Add metolazone   Long discussion about need to limit intake. Meds d/w PharmD  Toribio Fuel, MD  3:57 PM

## 2024-07-18 NOTE — TOC Initial Note (Signed)
 Transition of Care [facility]) - Initial/Assessment Note    Patient Details  Name: Ryan [name] MRN: [id number] Date of Birth: 1966-03-12  Transition of Care [facility]) CM/SW Contact:    Justina Delcia Czar, RN Phone Number: [phone] 07/18/2024, 4:35 PM  Clinical Narrative:                  Spoke to pt and states he is independent at home. Dtr takes him to appts. Lives at home with SO. Has scale at home for daily weights. Provided pt with Living Better with HF booklet. Educated on importance of daily weights and low sodium/heart healthy diet.   Will continue to follow for dc needs.     Expected Discharge Plan: Home/Self Care Barriers to Discharge: Continued Medical Work up   Patient Goals and CMS Choice Patient states their goals for this hospitalization and ongoing recovery are:: wants to get better          Expected Discharge Plan and Services   Discharge Planning Services: CM Consult                                          Prior Living Arrangements/Services   Lives with:: Significant Other Patient language and need for interpreter reviewed:: Yes Do you feel safe going back to the place where you live?: Yes      Need for Family Participation in Patient Care: No (Comment) Care giver support system in place?: No (comment) Current home services: DME (scale, rolling walker) Criminal Activity/Legal Involvement Pertinent to Current Situation/Hospitalization: No - Comment as needed  Activities of Daily Living      Permission Sought/Granted Permission sought to share information with : Case Manager, Family Supports, PCP Permission granted to share information with : Yes, Verbal Permission Granted  Share Information with NAME: Ronal Sharps  Permission granted to share info w AGENCY: DME, PCP  Permission granted to share info w Relationship: SO  Permission granted to share info w Contact Information: [phone]  Emotional Assessment Appearance:: Appears stated  age Attitude/Demeanor/Rapport: Engaged Affect (typically observed): Accepting Orientation: : Oriented to Self, Oriented to Place, Oriented to  Time, Oriented to Situation   Psych Involvement: No (comment)  Admission diagnosis:  Acute respiratory failure with hypoxia (HCC) [J96.01] Acute on chronic systolic (congestive) heart failure (HCC) [I50.23] Acute on chronic congestive heart failure, unspecified heart failure type [facility]) [I50.9] Patient Active Problem List   Diagnosis Date Noted   Acute on chronic systolic (congestive) heart failure (HCC) 07/17/2024   Acute on chronic systolic CHF (congestive heart failure) (HCC) 03/01/2024   Chronic kidney disease, stage 3a (HCC) 03/01/2024   Obesity, class 1 03/01/2024   Heart failure (HCC) 03/01/2024   NSTEMI (non-ST elevation myocardial infarction) (HCC) 03/26/2023   HFrEF (heart failure with reduced ejection fraction) (HCC) 03/26/2023   Left ventricular apical thrombus 03/26/2023   Pure hypercholesterolemia 03/26/2023   Claudication in peripheral vascular disease (HCC) 01/26/2023   Paroxysmal atrial fibrillation (HCC) 03/31/2022   Acute combined systolic and diastolic heart failure (HCC) 03/29/2022   S/P CABG x 3 05/05/2020   Unstable angina (HCC)    Atherosclerosis of native artery of both lower extremities with intermittent claudication (HCC) 04/01/2018   COPD (chronic obstructive pulmonary disease) (HCC) 07/31/2017   Hyponatremia 07/31/2017   Non-compliance 07/31/2017   ST elevation myocardial infarction involving left anterior descending (LAD) coronary artery (HCC) 07/31/2017  Transaminitis 07/31/2017   Uncontrolled diabetes mellitus 10/23/2016   Essential hypertension 10/23/2016   Subclavian steal syndrome 08/11/2013   PVD (peripheral vascular disease) (HCC) 03/31/2013   Numbness and tingling of right arm 03/31/2013   Pain in joint, upper arm 03/31/2013   Peripheral vascular disease, unspecified (HCC) 12/16/2012   Occlusion and  stenosis of carotid artery without mention of cerebral infarction 08/26/2012   Chronic total occlusion of artery of the extremities (HCC) 02/26/2012   PAD (peripheral artery disease) (HCC) 01/24/2012   Ischemic cardiomyopathy 01/24/2012   Cardiomyopathy, secondary (HCC) 12/26/2011   Claudication (HCC) 12/26/2011   Type 2 diabetes mellitus with hyperlipidemia (HCC) 12/14/2011   Acute myocardial infarction of other anterior wall, subsequent episode of care 12/12/2011   Tobacco abuse    CAD (coronary artery disease)    PCP:  [name], MD Pharmacy:   [facility] [place], [place] - [address] [place] [address] Phone: [phone] Fax: [phone]  [facility] [address] [place] [address] Phone: [phone] Fax: [phone]     Social Drivers of Health (SDOH) Social History: SDOH Screenings   Food Insecurity: No Food Insecurity (03/01/2024)  Housing: Low Risk  (03/01/2024)  Transportation Needs: No Transportation Needs (03/01/2024)  Utilities: Not At Risk (03/01/2024)  Alcohol Screen: Low Risk  (03/30/2022)  Financial Resource Strain: Low Risk  (03/30/2022)  Tobacco Use: High Risk (07/17/2024)   SDOH Interventions:     Readmission Risk Interventions     No data to display

## 2024-07-19 DIAGNOSIS — I5023 Acute on chronic systolic (congestive) heart failure: Secondary | ICD-10-CM | POA: Diagnosis not present

## 2024-07-19 LAB — BASIC METABOLIC PANEL WITH GFR
Anion gap: 9 (ref 5–15)
BUN: 30 mg/dL — ABNORMAL HIGH (ref 6–20)
CO2: 29 mmol/L (ref 22–32)
Calcium: 8.3 mg/dL — ABNORMAL LOW (ref 8.9–10.3)
Chloride: 98 mmol/L (ref 98–111)
Creatinine, Ser: 1.72 mg/dL — ABNORMAL HIGH (ref 0.61–1.24)
GFR, Estimated: 46 mL/min — ABNORMAL LOW (ref >60)
Glucose, Bld: 120 mg/dL — ABNORMAL HIGH (ref 70–99)
Potassium: 4 mmol/L (ref 3.5–5.1)
Sodium: 136 mmol/L (ref 135–145)

## 2024-07-19 LAB — GLUCOSE, CAPILLARY
Glucose-Capillary: 141 mg/dL — ABNORMAL HIGH (ref 70–99)
Glucose-Capillary: 190 mg/dL — ABNORMAL HIGH (ref 70–99)
Glucose-Capillary: 325 mg/dL — ABNORMAL HIGH (ref 70–99)
Glucose-Capillary: 146 mg/dL — ABNORMAL HIGH (ref 70–99)

## 2024-07-19 NOTE — Progress Notes (Signed)
 Cardiology Progress Note  Patient ID: Ryan [name] MRN: [id number] DOB: [birth year] Date of Encounter: 07/19/2024 Primary Cardiologist: [name], MD  Subjective   Chief Complaint: SOB  HPI: Still volume up.  Creatinine improving.  ROS:  All other ROS reviewed and negative. Pertinent positives noted in the HPI.     Telemetry  Overnight telemetry shows sinus rhythm 60s, which I personally reviewed.   Physical Exam   Vitals:   07/18/24 1929 07/18/24 2000 07/18/24 2325 07/19/24 0500  BP: 92/61 (!) 94/55 (!) 86/54 (!) 89/58  Pulse: 65  61 (!) 56  Resp: 18 18 20 18   Temp: 97.9 F (36.6 C) 98.1 F (36.7 C) 97.7 F (36.5 C) 97.8 F (36.6 C)  TempSrc: Axillary Oral Oral Oral  SpO2: 99% 99% 99% 97%  Weight:    99.7 kg  Height:        Intake/Output Summary (Last 24 hours) at 07/19/2024 0959 Last data filed at 07/19/2024 0656 Gross per 24 hour  Intake 253 ml  Output 2575 ml  Net -2322 ml       07/19/2024    5:00 AM 07/18/2024    1:47 PM 07/17/2024   10:12 AM  Last 3 Weights  Weight (lbs) 219 lb 12.8 oz 221 lb 9 oz 191 lb  Weight (kg) 99.7 kg 100.5 kg 86.637 kg    Body mass index is 32.46 kg/m.  General: Well nourished, well developed, in no acute distress Head: Atraumatic, normal size  Eyes: PEERLA, EOMI  Neck: Supple, JVD 10-12 cmH2O Endocrine: No thryomegaly Cardiac: Normal S1, S2; RRR; no murmurs, rubs, or gallops Lungs: Clear to auscultation bilaterally, no wheezing, rhonchi or rales  Abd: Soft, nontender, no hepatomegaly  Ext: 1+ pitting edema Musculoskeletal: No deformities, BUE and BLE strength normal and equal Skin: Warm and dry, no rashes   Neuro: Alert and oriented to person, place, time, and situation, CNII-XII grossly intact, no focal deficits  Psych: Normal mood and affect   Cardiac Studies  TTE 07/18/2024  1. Difficult acoustic windows Hypokinesis/ akinesis of the distal lateral  and apical walls.. Left ventricular ejection fraction, by  estimation, is  45%. The left ventricle has mildly decreased function. The left ventricle  demonstrates global hypokinesis.   Left ventricular diastolic parameters are consistent with Grade II  diastolic dysfunction (pseudonormalization).   2. Right ventricular systolic function is low normal. The right  ventricular size is normal.   3. Left atrial size was mildly dilated.   4. Trivial mitral valve regurgitation.   5. AV is thickened, calcified with minimally restricted motion Mean  gradeint through the valve is 13 mm Hg. . The aortic valve is tricuspid.  Aortic valve regurgitation is not visualized. Mild aortic valve stenosis.   6. The inferior vena cava is normal in size with greater than 50%  respiratory variability, suggesting right atrial pressure of 3 mmHg.   Patient Profile  Ryan [name] is a 58 y.o. male with CAD status post CABG/PCI, PAD, hyperlipidemia, LV apical thrombus admitted on 07/17/2024 with acute on chronic systolic heart failure.  Assessment & Plan   # Acute on chronic systolic heart failure, EF 45% - Remains volume overloaded.  Continue Lasix  120 mg twice daily. - Continue Diamox  500 mg twice daily. - May need redosing of metolazone .  We will closely watch urine output. - Kidney function slightly improved.  Holding Entresto  due to hypotension. - Holding home Aldactone  as well.  Hold beta-blocker in the setting of hypotension.  No  digoxin  in setting of kidney dysfunction. - Not a candidate for advanced therapies.  Lactic acid is normal.  Heart failure team following closely.  # Syncope - Thought to be vasovagal or medication related.  Echo shows improved EF.  # Persistent atrial flutter status post ablation - Continue amiodarone  for rhythm control strategy.  In sinus rhythm.  Continue Eliquis .  # AKI # CKD 3b - Kidney function improving with diuresis.  # CAD status post CABG - No angina.  Continue Plavix  and Eliquis .  On Lipitor and Repatha .  #  Diabetes -insulin  glargine 25 units BID. SSI  # IDA -stable hgb. Outpatient work-up  FEN -no IVF -code: full -diet: heart healthy -dvt ppx: eliquis  -disposition: 1-2 more days diuresis   For questions or updates, please contact [facility] Please consult [email or web address] for contact info under        Signed, Darryle T. Barbaraann, MD, [facility]  [facility]  07/19/2024 9:59 AM

## 2024-07-19 NOTE — Progress Notes (Signed)
 Called by nursing that the patient is requesting to leave AGAINST MEDICAL ADVICE.  Noted to have desaturations into the high 70s and low 80s without oxygen.  The patient is noticeably tachypneic.  We discussed that he is not medically stable to leave.  If he does leave he may have worsening heart failure and/or worsening respiratory failure that could result in his imminent death.  After a discussion he is willing to stay 1 more day.  Will continue with IV diuresis.  Signed, Darryle DASEN. Barbaraann, MD, [facility]  [facility]  [address] [place], [place] [address] [phone]  10:14 AM

## 2024-07-20 DIAGNOSIS — R55 Syncope and collapse: Secondary | ICD-10-CM

## 2024-07-20 DIAGNOSIS — I4892 Unspecified atrial flutter: Secondary | ICD-10-CM | POA: Insufficient documentation

## 2024-07-20 DIAGNOSIS — N179 Acute kidney failure, unspecified: Secondary | ICD-10-CM | POA: Insufficient documentation

## 2024-07-20 DIAGNOSIS — D509 Iron deficiency anemia, unspecified: Secondary | ICD-10-CM | POA: Insufficient documentation

## 2024-07-20 DIAGNOSIS — E785 Hyperlipidemia, unspecified: Secondary | ICD-10-CM | POA: Insufficient documentation

## 2024-07-20 DIAGNOSIS — E876 Hypokalemia: Secondary | ICD-10-CM

## 2024-07-20 DIAGNOSIS — J9601 Acute respiratory failure with hypoxia: Secondary | ICD-10-CM | POA: Insufficient documentation

## 2024-07-20 DIAGNOSIS — I5023 Acute on chronic systolic (congestive) heart failure: Secondary | ICD-10-CM | POA: Diagnosis not present

## 2024-07-20 DIAGNOSIS — N189 Chronic kidney disease, unspecified: Secondary | ICD-10-CM | POA: Insufficient documentation

## 2024-07-20 DIAGNOSIS — J9611 Chronic respiratory failure with hypoxia: Secondary | ICD-10-CM | POA: Insufficient documentation

## 2024-07-20 HISTORY — DX: Syncope and collapse: R55

## 2024-07-20 HISTORY — DX: Unspecified atrial flutter: I48.92

## 2024-07-20 LAB — BASIC METABOLIC PANEL WITH GFR
Anion gap: 13 (ref 5–15)
BUN: 27 mg/dL — ABNORMAL HIGH (ref 6–20)
CO2: 28 mmol/L (ref 22–32)
Calcium: 8.9 mg/dL (ref 8.9–10.3)
Chloride: 95 mmol/L — ABNORMAL LOW (ref 98–111)
Creatinine, Ser: 1.55 mg/dL — ABNORMAL HIGH (ref 0.61–1.24)
GFR, Estimated: 52 mL/min — ABNORMAL LOW (ref >60)
Glucose, Bld: 92 mg/dL (ref 70–99)
Potassium: 3.2 mmol/L — ABNORMAL LOW (ref 3.5–5.1)
Sodium: 136 mmol/L (ref 135–145)

## 2024-07-20 LAB — GLUCOSE, CAPILLARY: Glucose-Capillary: 83 mg/dL (ref 70–99)

## 2024-07-20 MED ORDER — TORSEMIDE 20 MG PO TABS
40.0000 mg | ORAL_TABLET | Freq: Once | ORAL | Status: AC
  Administered 2024-07-20: 40 mg via ORAL
  Filled 2024-07-20: qty 2

## 2024-07-20 MED ORDER — TORSEMIDE 20 MG PO TABS: 40.0000 mg | ORAL_TABLET | Freq: Every day | ORAL | 2 refills | Status: DC

## 2024-07-20 MED ORDER — SPIRONOLACTONE 25 MG PO TABS
25.0000 mg | ORAL_TABLET | Freq: Every day | ORAL | Status: DC
  Administered 2024-07-20: 25 mg via ORAL
  Filled 2024-07-20: qty 1

## 2024-07-20 MED ORDER — POTASSIUM CHLORIDE CRYS ER 20 MEQ PO TBCR
40.0000 meq | EXTENDED_RELEASE_TABLET | ORAL | Status: DC
  Administered 2024-07-20: 40 meq via ORAL
  Filled 2024-07-20: qty 2

## 2024-07-20 NOTE — Discharge Summary (Signed)
 Discharge Summary   Patient ID: Ryan [name] MRN: [id number]; DOB: Apr 17, 1966  Admit date: 07/17/2024 Discharge date: 07/20/2024  PCP:  [name], MD   [facility] Providers Cardiologist:  [name], MD  Electrophysiologist:  Fonda Kitty, MD  Advanced Heart Failure:  Morene JINNY Brownie, MD      Discharge Diagnoses  Principal Problem:   Acute on chronic systolic (congestive) heart failure [facility]) Active Problems:   Syncope   Paroxysmal atrial flutter (HCC)   CAD (coronary artery disease)   PAD (peripheral artery disease) (HCC)   S/P CABG x 3   Type 2 diabetes mellitus with complication, with long-term current use of insulin  (HCC)   Essential hypertension   COPD (chronic obstructive pulmonary disease) (HCC)   Acute kidney injury superimposed on CKD (HCC)   Hypokalemia   Iron deficiency anemia   Acute hypoxic respiratory failure (HCC)   Hyperlipidemia   Diagnostic Studies/Procedures   Echocardiogram 07/18/2024: Impressions: 1. Difficult acoustic windows Hypokinesis/ akinesis of the distal lateral  and apical walls.. Left ventricular ejection fraction, by estimation, is  45%. The left ventricle has mildly decreased function. The left ventricle  demonstrates global hypokinesis.   Left ventricular diastolic parameters are consistent with Grade II  diastolic dysfunction (pseudonormalization).   2. Right ventricular systolic function is low normal. The right  ventricular size is normal.   3. Left atrial size was mildly dilated.   4. Trivial mitral valve regurgitation.   5. AV is thickened, calcified with minimally restricted motion Mean  gradeint through the valve is 13 mm Hg. . The aortic valve is tricuspid.  Aortic valve regurgitation is not visualized. Mild aortic valve stenosis.   6. The inferior vena cava is normal in size with greater than 50%  respiratory variability, suggesting right atrial pressure of 3 mmHg.   _____________   History of  Present Illness   Ryan [name] is a 58 y.o. male with a history of CAD s/p multiple interventions (DES to mid LAD in 2013 in setting of anterior STEMI, DES to LAD for in-stent restenosis in 2017, CABG x3 in 05/2020, DES to SVG-RCA graft in 03/2022, balloon angioplasty to in-stent restenosis of SVG-RCA in 03/2023), chronic HFrEF with EF of 25-30% on Echo in 02/2024, LV thrombus, paroxysmal atrial flutter/ atrial fibrillation s/p flutter ablation in 05/2024 on Eliquis , PAD s/p multiple interventions (aortobifemoral bypass in 2014, right femoral endarterectomy and right femoral-popliteal bypass in 2019, angioplasty of CFA and 2022 and then again in 12/2022, left femoral endarterectomy in 01/2023), carotid artery disease and subclavian steal syndrome s/p left carotid to subclavian bypass in 2014, COPD, hypertension, hyperlipidemia, uncontrolled type 2 diabetes mellitus, CKD stage III, and GERD.   Patient has a complex history as detailed above and follows with Dr. Brownie and Dr. Kitty. He was seen by Dr. Brownie on 07/08/2024 and was doing fairly well.  Digoxin  was stopped and he was started on Entresto .  He presented to the ED on 07/17/2024 with increased shortness of breath, bilateral lower extremity edema, weight gain, and syncope. He reported over the last week all of those symptoms had got progressively worse. He reported syncopal events associated with coughing spells. He reported being unable to stop coughing and finding himself on the floor shortly after. He reported intermittent lightheadedness. He reported compliance with all medications. He continues to smoke 1/2 pack per day and drink bee occasionally.  Admission labs/ tests showed: BNP 1,122. Hgb 10.4. Na 121, K 4.6, Cr 2.05. Lactic acid 1.2.  Chest x-ray showed bilateral pleural effusions.    Hospital Course   Consultants: None   Acute on Chronic HFrEF Ischemic Cardiomyopathy Patient has a history of chronic HFrEF due to ischemic cardiomyopathy has  recently worsened in setting of atrial flutter.  He was admitted for acute on chronic CHF after presenting with increased shortness of breath, worsening edema, and weight gain.  BNP elevated at 1,122.  Chest x-ray showed bilateral pleural effusions.  Echo showed LVEF of 45% with hypokinesis/akinesis of distal lateral and apical walls and grade 2 diastolic dysfunction as well as mild AAS.  EF improved from 25-30% in 02/2024.  He was aggressively diuresed with a high dose IV Lasix , Diamox , and Metolazone  with improvement.  He threatened to leave AMA on 07/19/2024 but we were able to commit him to stay given ongoing desaturations.  He was ambulated in the halls today and was again noted to desat with O2 sats to 82% with ambulating on room air.  However, he denied feeling any shortness of breath and O2 sat greater than 90% at rest.  We tried to convince him to stay for ongoing IV diuresis but patient refused and stated he would leave AMA if he was not discharged.  Therefore, we will transition to Torsemide  40 mg daily.  Continue home Spironolactone  25 mg daily.  Home Entresto  was stopped due to hypotension.  No beta-blocker in setting of hypotension. No SGLT2 inhibitor given uncontrolled diabetes with hemoglobin A1c of 12.3. No digoxin  in setting of renal function.  He will be provided with home O2 at discharge.  Discussed the importance of daily weights and sodium/fluid restrictions.  Will send message to the advanced CHF team to help arrange a close follow-up visit.  Syncope Patient reported multiple syncopal episodes on admission and the setting of coughing fits.  Loop recorder was interrogated on admission and showed no acute findings to correlate with syncope.  Felt to be vasovagal vagal or due to hypotension.  Echo this admission showed LVEF has improved as above.  No additional workup felt to be necessary.  Paroxysmal Atrial Fibrillation/ Flutter History of recurrent atrial flutter s/p recent ablation at  05/2024.  He also had a loop recorder placed at that time to evaluate for atrial fibrillation.  Interrogation of the recorder on admission showed 2 AF occurrences between 06/09/2024 and 07/11/2024 with longest run being 32 minutes but total AF burden less than 1%.  PVC burden 2.6%.  Continue sinus rhythm this admission.  Continue Amiodarone  200 mg daily.  No beta-blocker given hypotension.  Continue chronic anticoagulation with Eliquis  5 mg twice daily.  CAD Elevated Troponin Patient has a long history of CAD with multiple interventions in the past including CABG x3 in 2021. High-sensitivity troponin mildly elevated and flat this admission in the 150s consistent with demand ischemia. No angina.  Continue Plavix  75 mg daily.  Not on a statin. Continue Repatha  and Zetia .  PAD Patient has a long history of PAD with multiple interventions as described above.  No acute issues this admission.  Continue Plavix , Repatha , and Zetia .  Hypertension History of hypertension but more hypotensive this admission.  BP soft but stable on day of discharge.  Continue Spironolactone .  Entresto  was stopped.  Hyperlipidemia Continue home Repatha , Zetia , and Vascepa .  Uncontrolled Type 2 Diabetes Mellitus Hemoglobin A1c 12.3 this admission.  Dietary indiscretion playing a large role as he has been noted to have lots of sugary drinks and candy at bedside during this admission.  Diabetes can  order was consulted and helped assist with inpatient management.  Addition was also consulted to provide education but patient was not very engaged with this.  Will resume home regimen at discharge.  Advised the importance of following up with PCP for further management of this.    Acute on CKD Stage III Creatinine 2.05 on admission.  Baseline around 1.3.  Creatinine improved with diuresis and down to 1.55 on discharge. Needs repeat BMP at follow-up visit.  Hypokalemia Potassium mildly low at 3.2 today at discharge after aggressive  diuresis.  This was repleted prior to discharge.  Continue Spironolactone  and home potassium supplementation (KCl 20 mill equivalents twice daily) at discharge. Needs repeat BMP at follow-up visit.  Iron Deficiency Anemia Hemoglobin 10.4 on admission and 10.1 on 07/18/2024. This has been downtrending over the last couple of months.Iron low at 25 and Sat Ratio low at 7. Ferritin 63. Folate and Vitamin B12 normal. No over signs of bleeding. May need outpatient GI work-up. Will defer need for IV iron to Advanced CHF team.  COPD Acute Hypoxic Respiratory Failure COPD stable this admission. However, he was noted to be hypoxic this admission in setting of acute on chronic CHF. On day of discharge, O2 sat dropped to 82% with ambulation on room air. O2 sat >90% on room at rest. Will provide supplemental O2 at discharge. DME ordered placed. Continue home inhaler. Follow-up with PCP.  Patient seen and examined by Dr. Barbaraann today and felt to be okay for discharge (ideally would keep him longer for IV diuresis but patient refuses). Will send message to [facility] to help arrange close follow-up. Medications as below.  Did the patient have an acute coronary syndrome (MI, NSTEMI, STEMI, etc) this admission?:  No.   The elevated Troponin was due to the acute medical illness (demand ischemia).     _____________  Discharge Vitals Blood pressure 101/60, pulse 64, temperature 97.6 F (36.4 C), temperature source Oral, resp. rate 18, height 5' 9 (1.753 m), weight 93.8 kg, SpO2 99%.  Filed Weights   07/18/24 1347 07/19/24 0500 07/20/24 0419  Weight: 100.5 kg 99.7 kg 93.8 kg    Labs & Radiologic Studies  CBC Recent Labs    07/18/24 0618  WBC 7.5  HGB 10.1*  HCT 33.9*  MCV 81.3  PLT 235   Basic Metabolic Panel Recent Labs    91/85/74 1335 07/18/24 0618 07/19/24 0237 07/20/24 0243  NA  --    < > 136 136  K  --    < > 4.0 3.2*  CL  --    < > 98 95*  CO2  --    < > 29 28  GLUCOSE  --     < > 120* 92  BUN  --    < > 30* 27*  CREATININE  --    < > 1.72* 1.55*  CALCIUM   --    < > 8.3* 8.9  MG 1.9  --   --   --    < > = values in this interval not displayed.   Liver Function Tests No results for input(s): AST, ALT, ALKPHOS, BILITOT, PROT, ALBUMIN  in the last 72 hours. No results for input(s): LIPASE, AMYLASE in the last 72 hours. High Sensitivity Troponin:   Recent Labs  Lab 07/17/24 1335 07/17/24 1535  TROPONINIHS 156* 150*    No results for input(s): TRNPT in the last 720 hours.  BNP Invalid input(s): POCBNP No results for input(s): PROBNP in  the last 72 hours.  No results for input(s): BNP in the last 72 hours.   D-Dimer No results for input(s): DDIMER in the last 72 hours. Hemoglobin A1C Recent Labs    07/18/24 0618  HGBA1C 12.3*   Fasting Lipid Panel No results for input(s): CHOL, HDL, LDLCALC, TRIG, CHOLHDL, LDLDIRECT in the last 72 hours. No results found for: LIPOA  Thyroid Function Tests No results for input(s): TSH, T4TOTAL, T3FREE, THYROIDAB in the last 72 hours.  Invalid input(s): FREET3 _____________  ECHOCARDIOGRAM COMPLETE Result Date: 07/18/2024    ECHOCARDIOGRAM REPORT   Patient Name:   Ryan [name] Date of Exam: 07/18/2024 Medical Rec #:  [id number]    Height:       69.0 in Accession #:    [id number]   Weight:       191.0 lb Date of Birth:  [birth year]   BSA:          2.026 m Patient Age:    57 years     BP:           91/54 mmHg Patient Gender: M            HR:           64 bpm. Exam Location:  Inpatient Procedure: 2D Echo, Cardiac Doppler and Color Doppler (Both Spectral and Color            Flow Doppler were utilized during procedure). Indications:    CHF-Acute Systolic I50.21  History:        Patient has prior history of Echocardiogram examinations, most                 recent 03/02/2024. CHF, CAD, Prior CABG, COPD; Risk                 Factors:Hypertension and Diabetes.  Sonographer:    Jayson Gaskins Referring Phys: [id number] ALMA L DIAZ IMPRESSIONS  1. Difficult acoustic windows Hypokinesis/ akinesis of the distal lateral and apical walls.. Left ventricular ejection fraction, by estimation, is 45%. The left ventricle has mildly decreased function. The left ventricle demonstrates global hypokinesis.  Left ventricular diastolic parameters are consistent with Grade II diastolic dysfunction (pseudonormalization).  2. Right ventricular systolic function is low normal. The right ventricular size is normal.  3. Left atrial size was mildly dilated.  4. Trivial mitral valve regurgitation.  5. AV is thickened, calcified with minimally restricted motion Mean gradeint through the valve is 13 mm Hg. . The aortic valve is tricuspid. Aortic valve regurgitation is not visualized. Mild aortic valve stenosis.  6. The inferior vena cava is normal in size with greater than 50% respiratory variability, suggesting right atrial pressure of 3 mmHg. FINDINGS  Left Ventricle: Difficult acoustic windows Hypokinesis/ akinesis of the distal lateral and apical walls. Left ventricular ejection fraction, by estimation, is 45%. The left ventricle has mildly decreased function. The left ventricle demonstrates global hypokinesis. The left ventricular internal cavity size was normal in size. Suboptimal image quality limits for assessment of left ventricular hypertrophy. Left ventricular diastolic parameters are consistent with Grade II diastolic dysfunction (pseudonormalization). Right Ventricle: The right ventricular size is normal. No increase in right ventricular wall thickness. Right ventricular systolic function is low normal. Left Atrium: Left atrial size was mildly dilated. Right Atrium: Right atrial size was normal in size. Pericardium: There is no evidence of pericardial effusion. Mitral Valve: There is mild thickening of the mitral valve leaflet(s). Trivial mitral valve regurgitation. Tricuspid Valve: The tricuspid valve  is normal  in structure. Tricuspid valve regurgitation is trivial. Aortic Valve: AV is thickened, calcified with minimally restricted motion Mean gradeint through the valve is 13 mm Hg. The aortic valve is tricuspid. Aortic valve regurgitation is not visualized. Mild aortic stenosis is present. Aortic valve mean gradient measures 11.5 mmHg. Aortic valve peak gradient measures 18.0 mmHg. Aortic valve area, by VTI measures 1.60 cm. Pulmonic Valve: The pulmonic valve was grossly normal. Pulmonic valve regurgitation is trivial. Aorta: The aortic root is normal in size and structure. Venous: The inferior vena cava is normal in size with greater than 50% respiratory variability, suggesting right atrial pressure of 3 mmHg. IAS/Shunts: No atrial level shunt detected by color flow Doppler.  LEFT VENTRICLE PLAX 2D LVIDd:         5.10 cm   Diastology LVIDs:         3.50 cm   LV e' medial:    5.55 cm/s LV PW:         1.00 cm   LV E/e' medial:  23.1 LV IVS:        0.90 cm   LV e' lateral:   10.70 cm/s LVOT diam:     1.80 cm   LV E/e' lateral: 12.0 LV SV:         66 LV SV Index:   33 LVOT Area:     2.54 cm  RIGHT VENTRICLE RV S prime:     5.44 cm/s TAPSE (M-mode): 2.1 cm LEFT ATRIUM             Index        RIGHT ATRIUM           Index LA Vol (A2C):   52.8 ml 26.06 ml/m  RA Area:     15.40 cm LA Vol (A4C):   69.0 ml 34.06 ml/m  RA Volume:   35.70 ml  17.62 ml/m LA Biplane Vol: 62.3 ml 30.75 ml/m  AORTIC VALVE AV Area (Vmax):    1.57 cm AV Area (Vmean):   1.42 cm AV Area (VTI):     1.60 cm AV Vmax:           212.00 cm/s AV Vmean:          161.000 cm/s AV VTI:            0.415 m AV Peak Grad:      18.0 mmHg AV Mean Grad:      11.5 mmHg LVOT Vmax:         131.00 cm/s LVOT Vmean:        89.800 cm/s LVOT VTI:          0.261 m LVOT/AV VTI ratio: 0.63  AORTA Ao Root diam: 2.30 cm MITRAL VALVE MV Area (PHT): 3.77 cm     SHUNTS MV Decel Time: 201 msec     Systemic VTI:  0.26 m MV E velocity: 128.00 cm/s  Systemic Diam: 1.80 cm MV A  velocity: 70.20 cm/s MV E/A ratio:  1.82 Vina Gull MD Electronically signed by Vina Gull MD Signature Date/Time: 07/18/2024/1:55:19 PM    Final    DG Chest 2 View Result Date: 07/17/2024 CLINICAL DATA:  58 year old male with shortness of breath. EXAM: CHEST - 2 VIEW COMPARISON:  Cardiac CT 04/18/2024 and earlier. FINDINGS: AP and lateral views 1033 hours. Cardiomegaly. Chronic CABG, sternotomy. New left chest wall cardiac loop recorder or superficial ICD. Stable cardiac size and mediastinal contours. Visualized tracheal air column is within normal  limits. Stable and normal lung volumes. Small volume bilateral layering pleural fluid and tracking into the major fissures, similar to the CT in May. Underlying lung vascularity appears stable to improved from previous exams, no overt edema. No pneumothorax or pleural effusion. No acute osseous abnormality identified. Negative visible bowel gas. IMPRESSION: Cardiomegaly with small bilateral pleural effusions, similar to cardiac CT in May. No overt edema. Electronically Signed   By: VEAR Hurst M.D.   On: 07/17/2024 10:46   CUP PACEART REMOTE DEVICE CHECK Result Date: 07/11/2024 ILR summary report received. Battery status OK. Normal device function. No new symptom, tachy, brady, or pause episodes. Monthly summary reports and ROV/PRN 2 new AF/AFL events, 12-66min in duration, mean HR's 96-99, EGM's c/w AF/AFL, Eliquis  per EPIC s/p AFL ablation 05/05/24 - clinic aware [place], [facility] ILR summary report received. Battery status OK. Normal device function. No new symptom, tachy, brady, or pause episodes. Monthly summary reports and ROV/PRN 2 new AF/AFL events, 12-27min in duration, mean HR's 96-99, EGM's c/w AF/AFL, Eliquis  per EPIC s/p AFL ablation 05/05/24 - clinic aware [place], [facility]   Disposition Patient is being discharged home today in good condition.  Follow-up Plans & Appointments  Follow-up Information     [name], MD Follow up.   Specialty: Internal Medicine Why:  Please schedule a hospital follow up in 7-14 days to see PCP Contact information: [address] [place] [address] [phone]         Zenaida Morene PARAS, MD Follow up.   Specialty: Cardiology Why: Dr. Starla office will call you to schedule a hospital follow-up visit. If you do not hear anything within 2 business days, please call to schedule this. Contact information: [address] [address] [phone]         [facility] Follow up.   Why: CALL [phone] WHEN YOU GET HOME TO GET YOUR HOME OXYGEN SET UP               Discharge Instructions     (HEART FAILURE PATIENTS) Call MD:  Anytime you have any of the following symptoms: 1) 3 pound weight gain in 24 hours or 5 pounds in 1 week 2) shortness of breath, with or without a dry hacking cough 3) swelling in the hands, feet or stomach 4) if you have to sleep on extra pillows at night in order to breathe.   Complete by: As directed    Diet - low sodium heart healthy   Complete by: As directed    Increase activity slowly   Complete by: As directed        Discharge Medications Allergies as of 07/20/2024       Reactions   Metformin Anaphylaxis   Water blisters between fingers   Hydrocodone  Hives, Nausea And Vomiting        Medication List     STOP taking these medications    traZODone  100 MG tablet Commonly known as: DESYREL        TAKE these medications    albuterol  108 (90 Base) MCG/ACT inhaler Commonly known as: VENTOLIN  HFA Inhale 1-2 puffs into the lungs every 6 (six) hours as needed for wheezing or shortness of breath.   amiodarone  200 MG tablet Commonly known as: PACERONE  TAKE 1 TABLET BY MOUTH EVERY DAY   cetirizine 10 MG tablet Commonly known as: ZYRTEC Take 10 mg by mouth daily as needed for allergies.   clopidogrel  75 MG tablet Commonly known as: PLAVIX  TAKE 1 TABLET  BY MOUTH EVERY DAY   cyclobenzaprine  10 MG tablet Commonly known as:  FLEXERIL  Take 1 tablet (10 mg total) by mouth 2 (two) times daily as needed for muscle spasms.   diazepam  10 MG tablet Commonly known as: VALIUM  Take 10 mg by mouth at bedtime.   Eliquis  5 MG Tabs tablet Generic drug: apixaban  TAKE 1 TABLET BY MOUTH 2 TIMES A DAY   ezetimibe  10 MG tablet Commonly known as: ZETIA  TAKE 1 TABLET BY MOUTH EACH DAY   gabapentin  300 MG capsule Commonly known as: NEURONTIN  Take 300 mg by mouth 2 (two) times daily.   icosapent  Ethyl 1 g capsule Commonly known as: VASCEPA  TAKE 2 CAPSULES BY MOUTH TWICE DAILY   insulin  glargine 100 UNIT/ML Solostar Pen Commonly known as: LANTUS  Inject 50 Units into the skin 2 (two) times daily.   insulin  lispro 100 UNIT/ML injection Commonly known as: HUMALOG  Inject 5-15 Units into the skin 3 (three) times daily as needed (blood sugar over 150).   nitroGLYCERIN  0.4 MG SL tablet Commonly known as: NITROSTAT  Place 0.4 mg under the tongue every 5 (five) minutes as needed for chest pain.   oxyCODONE -acetaminophen  10-325 MG tablet Commonly known as: PERCOCET Take 1 tablet by mouth 4 (four) times daily.   pantoprazole  40 MG tablet Commonly known as: PROTONIX  Take 40 mg by mouth daily.   potassium chloride  SA 20 MEQ tablet Commonly known as: KLOR-CON  M TAKE 1 TABLET BY MOUTH 2 TIMES A DAY   Repatha  SureClick 140 MG/ML Soaj Generic drug: Evolocumab  INJECT 140 MG INTO THE SKIN EVERY 14 DAYS AS DIRECTED   spironolactone  25 MG tablet Commonly known as: ALDACTONE  Take 1 tablet (25 mg total) by mouth every evening.   torsemide  20 MG tablet Commonly known as: DEMADEX  Take 2 tablets (40 mg total) by mouth daily.   Trintellix  20 MG Tabs tablet Generic drug: vortioxetine  HBr Take 20 mg by mouth daily in the afternoon.   Vitamin D (Ergocalciferol) 1.25 MG (50000 UNIT) Caps capsule Commonly known as: DRISDOL Take 50,000 Units by mouth once a week.               Durable Medical Equipment  (From  admission, onward)           Start     Ordered   07/20/24 1019  For home use only DME oxygen  Once       Question Answer Comment  Length of Need 6 Months   Mode or (Route) Nasal cannula   Frequency Continuous (stationary and portable oxygen unit needed)   Oxygen delivery system Gas      07/20/24 1018             Outstanding Labs/Studies  Repeat BMET at follow-up visit in 1 week.  Duration of Discharge Encounter: APP Time: 30 minutes   Signed, Venera Privott E Drue Harr, PA-C 07/20/2024, 12:40 PM

## 2024-07-20 NOTE — Plan of Care (Signed)
   Problem: Education: Goal: Ability to describe self-care measures that may prevent or decrease complications (Diabetes Survival Skills Education) will improve Outcome: Progressing

## 2024-07-20 NOTE — TOC Transition Note (Signed)
 Transition of Care [facility]) - Discharge Note   Patient Details  Name: Ryan [name] MRN: [id number] Date of Birth: February 03, 1966  [facility]) CM/SW Contact:  Marval Gell, RN Phone Number: 07/20/2024, 11:01 AM   Clinical Narrative:     Spoke w patient at bedside. [facility] to deliver tank to room for transport. Patient aware to call number on AVS to notify of arrival home for set up of home equipment   Final next level of care: Home/Self Care Barriers to Discharge: No Barriers Identified   Patient Goals and CMS Choice Patient states their goals for this hospitalization and ongoing recovery are:: wants to get better          Discharge Placement                       Discharge Plan and Services Additional resources added to the After Visit Summary for     Discharge Planning Services: CM Consult            DME Arranged: Oxygen DME Agency: [employer] Date DME Agency Contacted: 07/20/24 Time DME Agency Contacted: 1101 Representative spoke with at DME Agency: [place] HH Arranged: NA          Social Drivers of Health (SDOH) Interventions SDOH Screenings   Food Insecurity: No Food Insecurity (07/19/2024)  Housing: Low Risk  (07/19/2024)  Transportation Needs: No Transportation Needs (07/19/2024)  Utilities: Not At Risk (07/19/2024)  Alcohol Screen: Low Risk  (03/30/2022)  Financial Resource Strain: Low Risk  (03/30/2022)  Tobacco Use: High Risk (07/17/2024)     Readmission Risk Interventions     No data to display

## 2024-07-20 NOTE — Progress Notes (Signed)
 Dr Cesario , on call cardiologist aware of patients low blood pressure and stated it was okay to give IV lasix  and pain medication at the same time due to latest trending.   Vitals:   07/19/24 1550 07/19/24 1930 07/19/24 2134 07/19/24 2343  BP: (!) 98/59 (!) 90/51 (!) 86/53 (!) 99/55  Pulse:  63  62  Temp: 98.3 F (36.8 C) 97.7 F (36.5 C)  97.6 F (36.4 C)  Resp: 16 16  16   Height:      Weight:      SpO2: 92% 99%  97%  TempSrc: Oral Oral  Oral  BMI (Calculated):

## 2024-07-20 NOTE — Progress Notes (Signed)
 SATURATION QUALIFICATIONS:  Patient Saturations on Room Air at Rest = 92%  Patient Saturations on Room Air while Ambulating = 82%  Patient Saturations on 2 Liters of oxygen while Ambulating = 92%  This patient needs oxygen at home while ambulating to prevent serious injury.

## 2024-07-20 NOTE — Progress Notes (Signed)
 Patient Details Name: Ryan [name] MRN: [id number] DOB: 12/13/1965 Today's Date: 07/20/2024  Patient used: no AD Patient's saturation dropped to 82% while ambulating requiring 2L of oxygen to bring his saturations to WNL.

## 2024-07-20 NOTE — Discharge Instructions (Signed)
 Heart Failure Education: Weigh yourself EVERY morning after you go to the bathroom but before you eat or drink anything. Write this number down in a weight log/diary. If you gain 3 pounds overnight or 5 pounds in a week, call the office. Take your medicines as prescribed. If you have concerns about your medications, please call us before you stop taking them.  Eat low salt foods--Limit salt (sodium) to 2000 mg per day. This will help prevent your body from holding onto fluid. Read food labels as many processed foods have a lot of sodium, especially canned goods and prepackaged meats. If you would like some assistance choosing low sodium foods, we would be happy to set you up with a nutritionist. Limit all fluids for the day to less than 2 liters (64 ounces). Fluid includes all drinks, coffee, juice, ice chips, soup, jello, and all other liquids. Stay as active as you can everyday. Staying active will give you more energy and make your muscles stronger. Start with 5 minutes at a time and work your way up to 30 minutes a day. Break up your activities--do some in the morning and some in the afternoon. Start with 3 days per week and work your way up to 5 days as you can.  If you have chest pain, feel short of breath, dizzy, or lightheaded, STOP. If you don't feel better after a short rest, call 911. If you do feel better, call the office to let us know you have symptoms with exercise.

## 2024-07-21 NOTE — Progress Notes (Signed)
 ADVANCED HEART FAILURE FOLLOW UP CLINIC NOTE  Referring Physician: [name], MD  Primary Care: [name], MD Primary Cardiologist:  HPI: Ryan [name] is a 58 y.o. male with a PMH of CAD with multiple PCI and CABG, PAD, HLD, LV apical thrombus, carotid artery disease, tobacco use who presents for follow up of recent hospitalization for cardiogenic shock and afib.     Patient has had extensive history of atherosclerotic disease.  She is had CABG in 2021, multiple PCI including PCI to a vein graft to the RCA he also underwent aortobifem bypass in 2014, left carotid to subclavian bypass in 2014, right femoral to popliteal bypass and right femoral endarterectomy, angioplasty to the right CFA, last in 2024, and left femoral endarterectomy in 2024.    SUBJECTIVE: Today he returns for post hospital follow up. He was admitted last week with massive volume overload suspect in the setting of high volume intake and dietary noncompliance. He was diuresed with IV lasix  but he threatened to leave AMA twice before fully diuresed. Seen by diabetes coordinator and dietitian, patient with poor attention and no interest at making changes during these visits.  Cardiology agreed to d/c (otherwise he would leave AMA) with close AHF f/u though he was still volume overloaded. Restarted PO diuretics at discharge. Entresto  stopped 2/2 hypotension. Overall feeling ok. Denies palpitations, CP, or PND/Orthopnea. Dizziness with position changes. Still has swelling in his legs. SOB with activity. Appetite ok, eating out about 50% of the time. No fever or chills. Does not weight at home. Taking all medications. Smokes 1/2 PPD. Chewing lots of ice.   PMH, current medications, allergies, social history, and family history reviewed in epic.  PHYSICAL EXAM: Vitals:   07/29/24 0829  BP: 110/70  Pulse: 72  SpO2: 90%   General:  chronically ill appearing.  No respiratory difficulty Neck: JVD ~12 with v waves to  ~16cm.  Cor: Regular rate & irregular rhythm. No murmurs. Lungs: coarse bases Extremities: +1-2 BLE edema  Neuro: alert & oriented x 3. Affect pleasant.   Wt Readings from Last 3 Encounters:  07/29/24 99.2 kg (218 lb 12.8 oz)  07/20/24 93.8 kg (206 lb 12.7 oz)  07/08/24 93.2 kg (205 lb 6.4 oz)    Unable to get ReDs reading x2 today   DATA REVIEW  ECG: 03/13/24: Aflutter with variable block    ECHO: TTE 06/21/23: EF 35-40, Gr 2 DD, NL RVSF, mil dLAE, mild MR, mild AI, AV sclerosis, RAP 8, ant-apical AK  TTE 03/02/24: LVEF 25-30%, reigonal WMA, moderately reduced RV function  Echo 8/25: EF 45%, Akinesis of distal lateral and apical walls. LV with GHK, GIIDD, RV low normal, LA mildly dilated, trivial MR  CATH: LHC 03/14/2023 ([facility]): LAD proximal/mid stent patent with 50 ISR, LIMA-LAD atretic, D1 jailed, SVG-D1 patent, LCx distal 100, RCA mid tandem 90, 80, SVG-distal RCA stent with 90 ISR    ASSESSMENT & PLAN: Chronic systolic heart failure: Ischemic related with recent worsening in the setting of atrial flutter. S/p flutter ablation, though having rare episodes of fib.  Continue current diuretic dose, given that he is in normal sinus rhythm will stop digoxin  and can start back home Entresto  24/26.  Holding off on Farxiga  given uncontrolled blood sugars noted on labs recently.  - Patient not a candidate for advanced therapies given severe PAD - Echo 8/25: EF 45%, Akinesis of distal lateral and apical walls. LV with GHK, GIIDD, RV low normal, LA mildly dilated, trivial  MR - NYHA IIIa-b - Volume overloaded on exam (but improved since discharge). Swelling looks MUCH better. Encouraged to wear compression socks at home. Weight up ~12 lbs since discharge. Reports compliance with all meds. Still eating out and drinking plenty of fluid.  - Increase torsemide  40 mg daily>twice daily. Continue KDUR 20 mEq BID. He will take metolazone  5 mg today and tomorrow + additional 40 mEq KDUR with  each dose.  - Continue spironolactone  25mg  daily - Off Entresto  with hypotension. BP stable today. If BP starts to creep up would trial on losartan.  - Off SGLT2i with A1c 12.3 8/25.   Atrial flutter: Recurrent, s/p atrial flutter ablation with Dr. Kennyth. Loop recorder implanted to evaluate for atrial fibrillation. - Continue amiodarone  200 mg daily given atrial fibrillation, however given his recurrent hypoxia may benefit from alternative medication strategy.  Will discuss with EP. - 7/25 thyroid labs and LFTs reviewed - Pulmonary appointment for sleep apnea given that he does not qualify for home sleep study by insurance. Also likely has element of COPD. (Scheduled for 07/31/24) - Continue eliquis  5mg  BID  CAD:  - Continue plavix , apixaban  - Continue atorvastatin , repatha , LDL at goal  PAD: Extensive history, carotid as well as femoral disease.  - Not a candidate for advanced therapies - Continue eliquis , plavix , aggressive risk factor modification - Smoking cessation encouraged  IDA:  - Noted Hgb down trending during last admission - Hgb 16.9 5/25> 11.3 7/25> 10.4>10.1  - tSat 7 and ferritin 63, will arrange IV iron  - Denies abnormal bleeding - Continue Eliquis  5 mg BID - CBC today, may need referral to GI if it continues to downtrend  DM: - Uncontrolled. Last A1c 12.5 8/25 - Has f/u with PCP next month - Reports compliance with insulin  regimen - Off SGLT2i for now   Follow up next week with APP to reassess volume.   Ryan [name] AGACNP-BC  Advanced Heart Failure 07/29/24

## 2024-07-28 ENCOUNTER — Telehealth (HOSPITAL_COMMUNITY): Payer: Self-pay

## 2024-07-28 NOTE — Telephone Encounter (Signed)
 Called to confirm/remind patient of their appointment at the [facility] on 07/29/24 8:30.   Appointment:   [x] Confirmed  [] Left mess   [] No answer/No voice mail  [] VM Full/unable to leave message  [] Phone not in service  Patient reminded to bring all medications and/or complete list.  Confirmed patient has transportation. Gave directions, instructed to utilize valet parking.

## 2024-07-29 ENCOUNTER — Ambulatory Visit (HOSPITAL_COMMUNITY): Payer: Self-pay | Admitting: Internal Medicine

## 2024-07-29 ENCOUNTER — Other Ambulatory Visit (HOSPITAL_COMMUNITY): Payer: Self-pay | Admitting: Internal Medicine

## 2024-07-29 ENCOUNTER — Telehealth (HOSPITAL_COMMUNITY): Payer: Self-pay

## 2024-07-29 ENCOUNTER — Encounter (HOSPITAL_COMMUNITY): Payer: Self-pay

## 2024-07-29 ENCOUNTER — Ambulatory Visit (HOSPITAL_COMMUNITY)
Admission: RE | Admit: 2024-07-29 | Discharge: 2024-07-29 | Disposition: A | Discharge status: Home / Self Care | Source: Ambulatory Visit | Attending: Internal Medicine | Admitting: Internal Medicine

## 2024-07-29 ENCOUNTER — Telehealth (HOSPITAL_COMMUNITY): Payer: Self-pay | Admitting: Internal Medicine

## 2024-07-29 VITALS — BP 110/70 | HR 72 | Wt 218.8 lb

## 2024-07-29 DIAGNOSIS — J449 Chronic obstructive pulmonary disease, unspecified: Secondary | ICD-10-CM

## 2024-07-29 DIAGNOSIS — Z955 Presence of coronary angioplasty implant and graft: Secondary | ICD-10-CM | POA: Diagnosis not present

## 2024-07-29 DIAGNOSIS — Z7902 Long term (current) use of antithrombotics/antiplatelets: Secondary | ICD-10-CM | POA: Insufficient documentation

## 2024-07-29 DIAGNOSIS — I4892 Unspecified atrial flutter: Secondary | ICD-10-CM | POA: Diagnosis not present

## 2024-07-29 DIAGNOSIS — Z794 Long term (current) use of insulin: Secondary | ICD-10-CM

## 2024-07-29 DIAGNOSIS — I4891 Unspecified atrial fibrillation: Secondary | ICD-10-CM | POA: Insufficient documentation

## 2024-07-29 DIAGNOSIS — I483 Typical atrial flutter: Secondary | ICD-10-CM | POA: Diagnosis not present

## 2024-07-29 DIAGNOSIS — I2510 Atherosclerotic heart disease of native coronary artery without angina pectoris: Secondary | ICD-10-CM | POA: Insufficient documentation

## 2024-07-29 DIAGNOSIS — D509 Iron deficiency anemia, unspecified: Secondary | ICD-10-CM

## 2024-07-29 DIAGNOSIS — I739 Peripheral vascular disease, unspecified: Secondary | ICD-10-CM

## 2024-07-29 DIAGNOSIS — Z79899 Other long term (current) drug therapy: Secondary | ICD-10-CM | POA: Insufficient documentation

## 2024-07-29 DIAGNOSIS — F17210 Nicotine dependence, cigarettes, uncomplicated: Secondary | ICD-10-CM | POA: Insufficient documentation

## 2024-07-29 DIAGNOSIS — I959 Hypotension, unspecified: Secondary | ICD-10-CM | POA: Insufficient documentation

## 2024-07-29 DIAGNOSIS — E1151 Type 2 diabetes mellitus with diabetic peripheral angiopathy without gangrene: Secondary | ICD-10-CM | POA: Diagnosis not present

## 2024-07-29 DIAGNOSIS — Z951 Presence of aortocoronary bypass graft: Secondary | ICD-10-CM | POA: Diagnosis not present

## 2024-07-29 DIAGNOSIS — Z7901 Long term (current) use of anticoagulants: Secondary | ICD-10-CM | POA: Insufficient documentation

## 2024-07-29 DIAGNOSIS — I5022 Chronic systolic (congestive) heart failure: Secondary | ICD-10-CM | POA: Diagnosis not present

## 2024-07-29 DIAGNOSIS — M7989 Other specified soft tissue disorders: Secondary | ICD-10-CM | POA: Insufficient documentation

## 2024-07-29 DIAGNOSIS — E118 Type 2 diabetes mellitus with unspecified complications: Secondary | ICD-10-CM

## 2024-07-29 LAB — CBC
HCT: 33.8 % — ABNORMAL LOW (ref 39.0–52.0)
Hemoglobin: 10 g/dL — ABNORMAL LOW (ref 13.0–17.0)
MCH: 23.4 pg — ABNORMAL LOW (ref 26.0–34.0)
MCHC: 29.6 g/dL — ABNORMAL LOW (ref 30.0–36.0)
MCV: 79 fL — ABNORMAL LOW (ref 80.0–100.0)
Platelets: 300 K/uL (ref 150–400)
RBC: 4.28 MIL/uL (ref 4.22–5.81)
RDW: 21.2 % — ABNORMAL HIGH (ref 11.5–15.5)
WBC: 7.8 K/uL (ref 4.0–10.5)
nRBC: 0 % (ref 0.0–0.2)

## 2024-07-29 LAB — BASIC METABOLIC PANEL WITH GFR
Anion gap: 10 (ref 5–15)
BUN: 21 mg/dL — ABNORMAL HIGH (ref 6–20)
CO2: 28 mmol/L (ref 22–32)
Calcium: 8.9 mg/dL (ref 8.9–10.3)
Chloride: 98 mmol/L (ref 98–111)
Creatinine, Ser: 1.45 mg/dL — ABNORMAL HIGH (ref 0.61–1.24)
GFR, Estimated: 56 mL/min — ABNORMAL LOW (ref >60)
Glucose, Bld: 209 mg/dL — ABNORMAL HIGH (ref 70–99)
Potassium: 4.6 mmol/L (ref 3.5–5.1)
Sodium: 136 mmol/L (ref 135–145)

## 2024-07-29 LAB — BRAIN NATRIURETIC PEPTIDE: B Natriuretic Peptide: 1679.4 pg/mL — ABNORMAL HIGH (ref 0.0–100.0)

## 2024-07-29 MED ORDER — METOLAZONE 5 MG PO TABS: 5.0000 mg | ORAL_TABLET | ORAL | 0 refills | Status: DC

## 2024-07-29 MED ORDER — TORSEMIDE 20 MG PO TABS: 40.0000 mg | ORAL_TABLET | Freq: Two times a day (BID) | ORAL | 3 refills | Status: DC

## 2024-07-29 NOTE — Telephone Encounter (Signed)
 Patient referred to infusion pharmacy team for ambulatory infusion of IV iron.  Insurance - [place] [employer]   Site of care - Site of care: [facility] Dx code - D50.9 IV Iron Therapy - Feraheme 510 mg x 2  Infusion appointments - Scheduling team will schedule patient as soon as possible.   Jenet Durio D. Shrinika Blatz, PharmD

## 2024-07-29 NOTE — Telephone Encounter (Signed)
 Auth Submission: NO AUTH NEEDED Site of care: Site of care: [facility] Payer: [place] [employer] Medication & CPT/J Code(s) submitted: Feraheme (ferumoxytol) [id number] Diagnosis Code: D50.9 Route of submission (phone, fax, portal):  Phone # Fax # Auth type: Buy/Bill HB Units/visits requested: 510mg  x 2 doses Reference number:  Approval from: 07/29/24 to 10/29/24

## 2024-07-29 NOTE — Patient Instructions (Addendum)
 Medication Changes:  INCREASE TORSEMIDE  TO 40MG  TWICE DAILY   TAKE METOLAZONE  5MG  TODAY, AND TOMORROW  PLEASE TAKE AN ADDITIONAL OF POTASSIUM TODAY AND TOMORROW WITH METOLAZONE  DOSAGE  Lab Work:  Labs done today, your results will be available in MyChart, we will contact you for abnormal readings.  Special Instructions // Education:  IRON INFUSION ORDERED- THEIR SCHEDULING DEPARTMENT WILL REACH OUT TO ARRANGE THIS   PLEASE WEAR COMPRESSION STOCKINGS   Follow-Up in: NEXT WEEK AS SCHEDULED   At the [facility], you and your health needs are our priority. We have a designated team specialized in the treatment of Heart Failure. This Care Team includes your primary Heart Failure Specialized Cardiologist (physician), Advanced Practice Providers (APPs- Physician Assistants and Nurse Practitioners), and Pharmacist who all work together to provide you with the care you need, when you need it.   You may see any of the following providers on your designated Care Team at your next follow up:  Dr. Toribio Fuel Dr. Ezra Shuck Dr. Ria Commander Dr. Odis Brownie Greig Mosses, NP [place]affie Shed, [place] [facility], [place] Beckey Coe, NP [place] Lee, NP Tinnie Redman, PharmD   Please be sure to bring in all your medications bottles to every appointment.   Need to Contact Us :  If you have any questions or concerns before your next appointment please send us  a message through [place] or call our office at [phone].    TO LEAVE A MESSAGE FOR THE NURSE SELECT OPTION 2, PLEASE LEAVE A MESSAGE INCLUDING: YOUR NAME DATE OF BIRTH CALL BACK NUMBER REASON FOR CALL**this is important as we prioritize the call backs  YOU WILL RECEIVE A CALL BACK THE SAME DAY AS LONG AS YOU CALL BEFORE 4:00 PM

## 2024-07-30 ENCOUNTER — Ambulatory Visit (HOSPITAL_COMMUNITY)

## 2024-07-31 ENCOUNTER — Ambulatory Visit (INDEPENDENT_AMBULATORY_CARE_PROVIDER_SITE_OTHER): Admitting: Pulmonary Disease

## 2024-07-31 VITALS — BP 122/75 | HR 78 | Ht 69.0 in | Wt 216.0 lb

## 2024-07-31 DIAGNOSIS — R0683 Snoring: Secondary | ICD-10-CM | POA: Diagnosis not present

## 2024-07-31 DIAGNOSIS — G478 Other sleep disorders: Secondary | ICD-10-CM

## 2024-07-31 MED ORDER — STIOLTO RESPIMAT 2.5-2.5 MCG/ACT IN AERS: 2.0000 | INHALATION_SPRAY | Freq: Every day | RESPIRATORY_TRACT | 3 refills | Status: DC

## 2024-07-31 MED ORDER — RAMELTEON 8 MG PO TABS: 8.0000 mg | ORAL_TABLET | Freq: Every day | ORAL | 2 refills | Status: DC

## 2024-07-31 NOTE — Patient Instructions (Addendum)
 Prescription for Rozerem  for insomnia  Scheduled for in-lab sleep study for nonrestorative sleep, heart failure history, on oxygen supplementation  Schedule pulmonary function test -Patient on amiodarone  -Obstructive lung disease  Consideration for pulmonary rehab following pulmonary function test  Stiolto inhaler to be used daily - This should help keep the breathing tube open  Continue to work on quitting smoking, it is important to make sure this happens  Follow-up in about 3 months  Call us  with significant concerns

## 2024-07-31 NOTE — Progress Notes (Unsigned)
 Ryan [name]    [id number]    Jan 05, 1966  Primary Care Physician:Avbuere, Aliene, MD  Referring Physician: Zenaida Morene PARAS, MD [address],  [place] [address]  Chief complaint:    Recent hospitalization with recent treatment for decompensated heart failure  History of coronary artery disease, CABG, peripheral arterial disease, hyperlipidemia, left ventricular apical thrombus, atrial fibrillation  HPI:  Patient being seen for concern for sleep disordered breathing, insomnia  Just cannot get a good nights rest  Difficulty falling asleep may take 1 to 2 hours, difficulty staying asleep multiple awakenings up to 4-5 times sometimes  Is an active smoker about half a pack a day At the heaviest smoked up to 2 packs a day - Working on quitting smoking  He has a history of coronary artery disease, heart failure with reduced ejection fraction  Recent hospitalization for hypoxemic respiratory failure and decompensated heart failure  Has a history of atrial fibrillation, ventricular thrombus for which he is on anticoagulation  Usually tries to go to bed between 9 and 10 PM Takes Xarelto as a fall asleep 4-5 awakenings Final wake up time about 10 AM  Denies any choking gasping respirations no significant dryness of his mouth in the morning No morning headaches Occasional night sweats  Does not wake up feeling rested  Activity limitation about 2 blocks or less  Was recently started on oxygen supplementation in the hospital  Compliant with medications and diuretics  No family history of sleep disordered breathing  Outpatient Encounter Medications as of 07/31/2024  Medication Sig   albuterol  (VENTOLIN  HFA) 108 (90 Base) MCG/ACT inhaler Inhale 1-2 puffs into the lungs every 6 (six) hours as needed for wheezing or shortness of breath.   amiodarone  (PACERONE ) 200 MG tablet TAKE 1 TABLET BY MOUTH EVERY DAY   cetirizine (ZYRTEC) 10 MG tablet Take 10 mg by  mouth daily as needed for allergies.   clopidogrel  (PLAVIX ) 75 MG tablet TAKE 1 TABLET BY MOUTH EVERY DAY   diazepam  (VALIUM ) 10 MG tablet Take 10 mg by mouth at bedtime.   ELIQUIS  5 MG TABS tablet TAKE 1 TABLET BY MOUTH 2 TIMES A DAY   ezetimibe  (ZETIA ) 10 MG tablet TAKE 1 TABLET BY MOUTH EACH DAY   gabapentin  (NEURONTIN ) 300 MG capsule Take 300 mg by mouth 2 (two) times daily.   icosapent  Ethyl (VASCEPA ) 1 g capsule TAKE 2 CAPSULES BY MOUTH TWICE DAILY   insulin  glargine (LANTUS ) 100 UNIT/ML Solostar Pen Inject 50 Units into the skin 2 (two) times daily.   insulin  lispro (HUMALOG ) 100 UNIT/ML injection Inject 5-15 Units into the skin 3 (three) times daily as needed (blood sugar over 150).   metolazone  (ZAROXOLYN ) 5 MG tablet Take 1 tablet (5 mg total) by mouth as directed. TAKE 1 TABLET 5MG  TODAY, AND TAKE 1 TABLET 5MG  TOMORROW, TAKE AN EXTRA OF POTASSIUM WITH METOLAZONE  DOSAGE   nitroGLYCERIN  (NITROSTAT ) 0.4 MG SL tablet Place 0.4 mg under the tongue every 5 (five) minutes as needed for chest pain.   oxyCODONE -acetaminophen  (PERCOCET) 10-325 MG tablet Take 1 tablet by mouth 4 (four) times daily.   pantoprazole  (PROTONIX ) 40 MG tablet Take 40 mg by mouth daily.   potassium chloride  SA (KLOR-CON  M) 20 MEQ tablet TAKE 1 TABLET BY MOUTH 2 TIMES A DAY   REPATHA  SURECLICK 140 MG/ML SOAJ INJECT 140 MG INTO THE SKIN EVERY 14 DAYS AS DIRECTED   spironolactone  (ALDACTONE ) 25 MG tablet  Take 1 tablet (25 mg total) by mouth every evening.   torsemide  (DEMADEX ) 20 MG tablet Take 2 tablets (40 mg total) by mouth 2 (two) times daily.   TRINTELLIX  20 MG TABS tablet Take 20 mg by mouth daily in the afternoon.   Vitamin D, Ergocalciferol, (DRISDOL) 1.25 MG (50000 UNIT) CAPS capsule Take 50,000 Units by mouth once a week.   No facility-administered encounter medications on file as of 07/31/2024.    Allergies as of 07/31/2024 - Review Complete 07/29/2024  Allergen Reaction Noted   Metformin Anaphylaxis  02/18/2021   Hydrocodone  Hives and Nausea And Vomiting 08/29/2012    Past Medical History:  Diagnosis Date   Acute myocardial infarction of other anterior wall, subsequent episode of care 12/12/2011   Anal fissure    Arthritis    back   Atherosclerosis of native artery of both lower extremities with intermittent claudication (HCC) 04/01/2018   Bruises easily    d/t being on Effient    CAD (coronary artery disease)    A. Acute Ant STEMI 12/12/2011    Cardiomyopathy secondary 12/26/2011   Chronic systolic CHF (congestive heart failure) (HCC)    ischemic CM // Echo 6/21: EF 30-35  //  Echocardiogram 10/21: LV apical thrombus resolved, EF 50-55, ant and apical, ant-lat HK, trivial MR, trivial AI   Chronic total occlusion of artery of the extremities (HCC) 02/26/2012   Claudication (HCC) 12/26/2011   Coronary atherosclerosis of native coronary artery    a. ant STEMI with cardiac arrest 2013 s/p DES to mLAD. b. [place]  10/2016 s/p PTCA to mLAD.   Diabetes mellitus without complication (HCC)    Essential hypertension 10/23/2016   GERD (gastroesophageal reflux disease)    takes tums   History of blood transfusion    no abnormal reaction noted   History of kidney stones    Hypertension    Ischemic cardiomyopathy    a. EF 35% in 02/2012 at time of acute MI, improved to normal on subsequent imaging.   MI (myocardial infarction) (HCC)    AMI 1/13 - complicated by VT/Tosades   Mixed hyperlipidemia    Numbness and tingling of right arm 03/31/2013   Occlusion and stenosis of carotid artery without mention of cerebral infarction 08/26/2012   PAD (peripheral artery disease) (HCC)    followed by Vascular - aortobifem bypass 01/2013 & left carotid-subclavian artery bypass 04/2013   Peripheral vascular disease, unspecified (HCC) 12/16/2012   PVD (peripheral vascular disease) (HCC) 03/31/2013   Rectal polyp    Subclavian steal syndrome 08/11/2013   Uncontrolled diabetes mellitus 10/23/2016   Wears partial  dentures    upper    Past Surgical History:  Procedure Laterality Date   A-FLUTTER ABLATION N/A 05/05/2024   Procedure: A-FLUTTER ABLATION;  Surgeon: [name], MD;  Location: [facility] INVASIVE CV LAB;  Service: Cardiovascular;  Laterality: N/A;   ABDOMINAL AORTOGRAM W/LOWER EXTREMITY N/A 06/26/2017   Procedure: Abdominal Aortogram w/Lower Extremity;  Surgeon: Serene Gaile ORN, MD;  Location: [facility] INVASIVE CV LAB;  Service: Cardiovascular;  Laterality: N/A;   ABDOMINAL AORTOGRAM W/LOWER EXTREMITY N/A 06/28/2021   Procedure: ABDOMINAL AORTOGRAM W/LOWER EXTREMITY;  Surgeon: Serene Gaile ORN, MD;  Location: [facility] INVASIVE CV LAB;  Service: Cardiovascular;  Laterality: N/A;   ABDOMINAL AORTOGRAM W/LOWER EXTREMITY N/A 01/02/2023   Procedure: ABDOMINAL AORTOGRAM W/LOWER EXTREMITY;  Surgeon: Serene Gaile ORN, MD;  Location: [facility] INVASIVE CV LAB;  Service: Cardiovascular;  Laterality: N/A;   ANAL FISSURECTOMY     AORTA - BILATERAL  FEMORAL ARTERY BYPASS GRAFT N/A 01/16/2013   Procedure: AORTA BIFEMORAL BYPASS GRAFT;  Surgeon: Gaile LELON New, MD;  Location: [facility] OR;  Service: Vascular;  Laterality: N/A;   APPENDECTOMY     CARDIAC CATHETERIZATION N/A 10/23/2016   Procedure: Left Heart Cath and Coronary Angiography;  Surgeon: Lonni JONETTA Cash, MD;  Location: [facility] INVASIVE CV LAB;  Service: Cardiovascular;  Laterality: N/A;   CARDIAC CATHETERIZATION N/A 10/23/2016   Procedure: Coronary Balloon Angioplasty;  Surgeon: Lonni JONETTA Cash, MD;  Location: [facility] INVASIVE CV LAB;  Service: Cardiovascular;  Laterality: N/A;   CARDIOVERSION N/A 03/31/2024   Procedure: CARDIOVERSION;  Surgeon: Zenaida Morene PARAS, MD;  Location: [facility] INVASIVE CV LAB;  Service: Cardiovascular;  Laterality: N/A;   CAROTID-SUBCLAVIAN BYPASS GRAFT Left 04/03/2013   Procedure: BYPASS GRAFT CAROTID-SUBCLAVIAN;  Surgeon: Gaile LELON New, MD;  Location: [facility] OR;  Service: Vascular;  Laterality: Left;   CIRCUMCISION N/A 12/15/2015   Procedure: CIRCUMCISION  ADULT;  Surgeon: Belvie LITTIE Clara, MD;  Location: [facility] ORS;  Service: Urology;  Laterality: N/A;   COLONOSCOPY     CORONARY ANGIOPLASTY     stent placed Dec 12, 2011 and 2018   CORONARY ARTERY BYPASS GRAFT N/A 05/05/2020   Procedure: CORONARY ARTERY BYPASS GRAFTING (CABG) x Three, Using Left internal Mammary Artery and Left Leg greater saphenous vein harvested endoscopically;  Surgeon: Army Dallas NOVAK, MD;  Location: [facility] OR;  Service: Open Heart Surgery;  Laterality: N/A;   CORONARY BALLOON ANGIOPLASTY N/A 05/23/2019   Procedure: CORONARY BALLOON ANGIOPLASTY;  Surgeon: Cash Lonni JONETTA, MD;  Location: [facility] INVASIVE CV LAB;  Service: Cardiovascular;  Laterality: N/A;   CORONARY PRESSURE/FFR STUDY N/A 04/22/2020   Procedure: INTRAVASCULAR PRESSURE WIRE/FFR STUDY;  Surgeon: Cash Lonni JONETTA, MD;  Location: [facility] INVASIVE CV LAB;  Service: Cardiovascular;  Laterality: N/A;   DIAGNOSTIC LAPAROSCOPY     ENDARTERECTOMY FEMORAL Right 03/13/2018   Procedure: FEMORAL ENDARTERECTOMY RIGHT;  Surgeon: New Gaile LELON, MD;  Location: [facility] OR;  Service: Vascular;  Laterality: Right;   ENDARTERECTOMY FEMORAL Left 01/26/2023   Procedure: REDO LEFT COMMON FEMORAL ENDARTERECTOMY WITH VPA;  Surgeon: New Gaile LELON, MD;  Location: [facility] OR;  Service: Vascular;  Laterality: Left;   FEMORAL-POPLITEAL BYPASS GRAFT Right 03/13/2018   Procedure: FEMORAL-POPLITEAL ARTERY BYPASS WITH NON-REVERSE VEIN RIGHT;  Surgeon: New Gaile LELON, MD;  Location: [facility] OR;  Service: Vascular;  Laterality: Right;   GROIN DISSECTION Right 03/13/2018   Procedure: RE-DO COMMON FEMORAL ARTERY EXPOSURE;  Surgeon: New Gaile LELON, MD;  Location: [facility] OR;  Service: Vascular;  Laterality: Right;   I & D EXTREMITY Left 04/18/2013   Procedure: debridement of left neck lymphocele;  Surgeon: Gaile LELON New, MD;  Location: [facility] OR;  Service: Vascular;  Laterality: Left;  I and D of left neck   I & D EXTREMITY Right 11/21/2014   Procedure: IRRIGATION AND  DEBRIDEMENT RIGHT HAND;  Surgeon: Elsie Mussel, MD;  Location: [facility] OR;  Service: Orthopedics;  Laterality: Right;   LEFT HEART CATH AND CORONARY ANGIOGRAPHY N/A 05/23/2019   Procedure: LEFT HEART CATH AND CORONARY ANGIOGRAPHY;  Surgeon: Cash Lonni JONETTA, MD;  Location: [facility] INVASIVE CV LAB;  Service: Cardiovascular;  Laterality: N/A;   LEFT HEART CATH AND CORONARY ANGIOGRAPHY N/A 04/22/2020   Procedure: LEFT HEART CATH AND CORONARY ANGIOGRAPHY;  Surgeon: Cash Lonni JONETTA, MD;  Location: [facility] INVASIVE CV LAB;  Service: Cardiovascular;  Laterality: N/A;   LEFT HEART CATHETERIZATION WITH CORONARY ANGIOGRAM N/A 12/12/2011   Procedure: LEFT  HEART CATHETERIZATION WITH CORONARY ANGIOGRAM;  Surgeon: Lonni JONETTA Cash, MD;  Location: [facility] CATH LAB;  Service: Cardiovascular;  Laterality: N/A;   LOOP RECORDER INSERTION N/A 05/05/2024   Procedure: LOOP RECORDER INSERTION;  Surgeon: [name], MD;  Location: [facility] INVASIVE CV LAB;  Service: Cardiovascular;  Laterality: N/A;   LOWER EXTREMITY ANGIOGRAM N/A 01/31/2012   Procedure: LOWER EXTREMITY ANGIOGRAM;  Surgeon: Lonni JONETTA Cash, MD;  Location: [facility] CATH LAB;  Service: Cardiovascular;  Laterality: N/A;   LOWER EXTREMITY ANGIOGRAPHY N/A 04/23/2018   Procedure: LOWER EXTREMITY ANGIOGRAPHY;  Surgeon: Serene Gaile ORN, MD;  Location: [facility] INVASIVE CV LAB;  Service: Cardiovascular;  Laterality: N/A;   PATCH ANGIOPLASTY Left 01/26/2023   Procedure: PATCH ANGIOPLASTY USING HEMASHIELD PLATINUM FINESSE;  Surgeon: Serene Gaile ORN, MD;  Location: [facility] OR;  Service: Vascular;  Laterality: Left;   PERCUTANEOUS CORONARY STENT INTERVENTION (PCI-S) Right 12/12/2011   Procedure: PERCUTANEOUS CORONARY STENT INTERVENTION (PCI-S);  Surgeon: Lonni JONETTA Cash, MD;  Location: [facility] CATH LAB;  Service: Cardiovascular;  Laterality: Right;   PERIPHERAL VASCULAR BALLOON ANGIOPLASTY Right 06/28/2021   Procedure: PERIPHERAL VASCULAR BALLOON ANGIOPLASTY;  Surgeon: Serene Gaile ORN, MD;  Location: [facility] INVASIVE CV LAB;  Service: Cardiovascular;  Laterality: Right;  common femoral (graft)   PERIPHERAL VASCULAR BALLOON ANGIOPLASTY  01/02/2023   Procedure: PERIPHERAL VASCULAR BALLOON ANGIOPLASTY;  Surgeon: Serene Gaile ORN, MD;  Location: [facility] INVASIVE CV LAB;  Service: Cardiovascular;;   PERIPHERAL VASCULAR INTERVENTION Left 04/23/2018   Procedure: PERIPHERAL VASCULAR INTERVENTION;  Surgeon: Serene Gaile ORN, MD;  Location: [facility] INVASIVE CV LAB;  Service: Cardiovascular;  Laterality: Left;  superficial femoral   REPAIR EXTENSOR TENDON Right 11/21/2014   Procedure: WITH REPAIR/RECONSTRUCTION OF EXTENSOR TENDONS AS NEEDED;  Surgeon: Elsie Mussel, MD;  Location: [facility] OR;  Service: Orthopedics;  Laterality: Right;   TEE WITHOUT CARDIOVERSION N/A 05/05/2020   Procedure: TRANSESOPHAGEAL ECHOCARDIOGRAM (TEE);  Surgeon: Army Dallas NOVAK, MD;  Location: [facility] OR;  Service: Open Heart Surgery;  Laterality: N/A;    Family History  Problem Relation Age of Onset   Cancer Father        Lung   Diabetes Mother    COPD Mother    Cancer Maternal Uncle        Colon    Social History   Socioeconomic History   Marital status: Divorced    Spouse name: Not on file   Number of children: 1   Years of education: Not on file   Highest education level: High school graduate  Occupational History    Employer: OTHER   Occupation: Disablity    Comment: Since 2013  Tobacco Use   Smoking status: Some Days    Current packs/day: 0.25    Average packs/day: 0.3 packs/day for 25.0 years (6.3 ttl pk-yrs)    Types: Cigarettes   Smokeless tobacco: Never   Tobacco comments:    Smoking cessation  Vaping Use   Vaping status: Never Used  Substance and Sexual Activity   Alcohol use: Yes    Alcohol/week: 2.0 standard drinks of alcohol    Types: 2 Cans of beer per week    Comment: socially   Drug use: No   Sexual activity: Yes  Other Topics Concern   Not on file  Social History Narrative   Disabled -  no longer works   Social Drivers of Corporate investment banker Strain: Low Risk  (03/30/2022)   Overall Financial Resource Strain (CARDIA)    Difficulty of Paying  Living Expenses: Not very hard  Food Insecurity: No Food Insecurity (07/19/2024)   Hunger Vital Sign    Worried About Running Out of Food in the Last Year: Never true    Ran Out of Food in the Last Year: Never true  Transportation Needs: No Transportation Needs (07/19/2024)   PRAPARE - Administrator, Civil Service (Medical): No    Lack of Transportation (Non-Medical): No  Physical Activity: Not on file  Stress: Not on file  Social Connections: Not on file  Intimate Partner Violence: Not At Risk (07/19/2024)   Humiliation, Afraid, Rape, and Kick questionnaire    Fear of Current or Ex-Partner: No    Emotionally Abused: No    Physically Abused: No    Sexually Abused: No    Review of Systems  Constitutional:  Positive for fatigue.  Respiratory:  Positive for shortness of breath.   Psychiatric/Behavioral:  Positive for sleep disturbance.     There were no vitals filed for this visit.   Physical Exam Constitutional:      Appearance: Normal appearance.  HENT:     Head: Normocephalic.     Mouth/Throat:     Mouth: Mucous membranes are moist.  Cardiovascular:     Rate and Rhythm: Normal rate.     Heart sounds: No murmur heard.    No friction rub.  Pulmonary:     Effort: No respiratory distress.     Breath sounds: No stridor. Rales present. No wheezing or rhonchi.  Musculoskeletal:     Cervical back: No rigidity or tenderness.  Neurological:     Mental Status: He is alert.  Psychiatric:        Mood and Affect: Mood normal.       07/31/2024    8:00 AM  Results of the Epworth flowsheet  Sitting and reading 0  Watching TV 1  Sitting, inactive in a public place (e.g. a theatre or a meeting) 0  As a passenger in a car for an hour without a break 2  Lying down to rest in the afternoon when  circumstances permit 0  Sitting and talking to someone 0  Sitting quietly after a lunch without alcohol 0  In a car, while stopped for a few minutes in traffic 0  Total score 3    Data Reviewed: Echocardiogram 07/28/2024, ejection fraction of 45% with akinesis of distal lateral and apical walls, grade 2 diastolic dysfunction, dilated left atrium  Previous pulmonary function test in 2021 did show some obstructive disease  Cardiac CT noted with some emphysema at the base, mild effusion  assessment/Plan: Chronic systolic heart failure NYHA III - On diuretics, off Entresto , off SGLT 2  Chronic insomnia - Will try Rozerem  to help him sleep  Heart failure with reduced ejection fraction  Concern for sleep disordered breathing Because of his history of cardiomyopathy, concern for central sleep apnea On oxygen supplementation -Scheduled for an in lab sleep study  For his chronic shortness of breath we will schedule him for a pulmonary function test Likely has underlying significant obstructive lung disease with emphysema on his CT scan - Schedule for pulmonary function test - Consideration for pulmonary rehab  Obstructive lung disease - Stiolto ordered - Continue albuterol  as needed  He is an active smoker - Has plans to quit - The importance of quitting cigarette smoking and the impact on his heart disease, lung disease, sleep quality was discussed  Encouraged graded exercise as tolerated  Tentative follow-up in  about 3 months  Encouraged to call with significant concerns  Jennet Epley MD [facility] 07/31/2024, 8:32 AM  CC: Zenaida Morene PARAS, MD

## 2024-08-01 ENCOUNTER — Other Ambulatory Visit (HOSPITAL_COMMUNITY): Payer: Self-pay

## 2024-08-01 ENCOUNTER — Telehealth: Payer: Self-pay | Admitting: Pharmacy Technician

## 2024-08-01 NOTE — Telephone Encounter (Signed)
 Pharmacy Patient Advocate Encounter  Received notification from [employer] that Prior Authorization for Ramelteon  has been APPROVED from 08/01/24 to 01/28/25   PA #/Case ID/Reference #: [id number]

## 2024-08-05 ENCOUNTER — Encounter (HOSPITAL_COMMUNITY)

## 2024-08-05 NOTE — Progress Notes (Incomplete)
 ADVANCED HEART FAILURE FOLLOW UP CLINIC NOTE  Referring Physician: [name], MD  Primary Care: [name], MD Primary Cardiologist: Dr. Zenaida   HPI: Ryan [name] is a 58 y.o. male with a PMH of CAD with multiple PCIs and CABG, PAD, HLD, LV apical thrombus, carotid artery disease, tobacco use.    Patient has had extensive history of atherosclerotic disease.  He had CABG in 2021, multiple PCIs including PCI to a vein graft to the RCA he also underwent aortobifem bypass in 2014, left carotid to subclavian bypass in 2014, right femoral to popliteal bypass and right femoral endarterectomy, angioplasty to the right CFA, last in 2024, and left femoral endarterectomy in 2024.  He was admitted 3/25 for cardiogenic shock in the setting of atrial fibrillation. Echo showed further drop in EF, down to 25-30%, RV was suboptimally visualized but appeared moderately reduced. He denied CP and HS trop trend (25>>32>>29) was not c/w ACS. Drop in EF was felt to be tachymediated from rapid AFL. He underwent cardioversion and required inotrope assisted diuresi.  6/25, underwent AFL ablation by Dr. Kennyth. ILR placed for afib surveillance.   Readmitted 8/25 w/ a/c CHF w/ massive volume overload, in the setting of high volume intake and dietary noncompliance. He was not fully diuresed at d/c, as he opted to leave AMA. GDMT also limited. Home Entresto  was stopped due to hypotension. Beta-blocker also stopped due to hypotension. No SGLT2 inhibitor due to uncontrolled diabetes. No digoxin  in setting of renal dysfunction. It was recommended that he continue torsemide  40 mg daily to continue w/ diuresis.   He presents today for f/u        SUBJECTIVE:   PMH, current medications, allergies, social history, and family history reviewed in epic.  There were no vitals filed for this visit. GENERAL: NAD Lungs- *** CARDIAC:  JVP: *** cm          Normal rate with regular rhythm. *** murmur.  Pulses ***. ***  edema.  ABDOMEN: Soft, non-tender, non-distended.  EXTREMITIES: Warm and well perfused.  NEUROLOGIC: No obvious FND   Wt Readings from Last 3 Encounters:  07/31/24 98 kg (216 lb)  07/29/24 99.2 kg (218 lb 12.8 oz)  07/20/24 93.8 kg (206 lb 12.7 oz)    ReDs ***    DATA REVIEW  ECG: 03/13/24: Aflutter with variable block    ECHO: TTE 06/21/23: EF 35-40, Gr 2 DD, NL RVSF, mil dLAE, mild MR, mild AI, AV sclerosis, RAP 8, ant-apical AK  TTE 03/02/24: LVEF 25-30%, reigonal WMA, moderately reduced RV function  Echo 8/25: EF 45%, Akinesis of distal lateral and apical walls. LV with GHK, GIIDD, RV low normal, LA mildly dilated, trivial MR  CATH: LHC 03/14/2023 ([facility]): LAD proximal/mid stent patent with 50 ISR, LIMA-LAD atretic, D1 jailed, SVG-D1 patent, LCx distal 100, RCA mid tandem 90, 80, SVG-distal RCA stent with 90 ISR    ASSESSMENT & PLAN: Chronic systolic heart failure: Ischemic related with recent worsening in the setting of atrial flutter. S/p flutter ablation, though having rare episodes of fib.  Continue current diuretic dose, given that he is in normal sinus rhythm will stop digoxin  and can start back home Entresto  24/26.  Holding off on Farxiga  given uncontrolled blood sugars noted on labs recently.  - Patient not a candidate for advanced therapies given severe PAD - Echo 8/25: EF 45%, Akinesis of distal lateral and apical walls. LV with GHK, GIIDD, RV low normal, LA mildly dilated, trivial MR -  NYHA IIIa-b - Volume overloaded on exam (but improved since discharge). Swelling looks MUCH better. Encouraged to wear compression socks at home. Weight up ~12 lbs since discharge. Reports compliance with all meds. Still eating out and drinking plenty of fluid.  - Increase torsemide  40 mg daily>twice daily. Continue KDUR 20 mEq BID. He will take metolazone  5 mg today and tomorrow + additional 40 mEq KDUR with each dose.  - Continue spironolactone  25mg  daily - Off Entresto  with  hypotension. BP stable today. If BP starts to creep up would trial on losartan.  - Off SGLT2i with A1c 12.3 8/25.   Atrial flutter: Recurrent, s/p atrial flutter ablation with Dr. Kennyth. Loop recorder implanted to evaluate for atrial fibrillation. - Continue amiodarone  200 mg daily given atrial fibrillation, however given his recurrent hypoxia may benefit from alternative medication strategy.  Will discuss with EP. - 7/25 thyroid labs and LFTs reviewed - Pulmonary appointment for sleep apnea given that he does not qualify for home sleep study by insurance. Also likely has element of COPD. (Scheduled for 07/31/24) - Continue eliquis  5mg  BID  CAD:  - Continue plavix , apixaban  - Continue atorvastatin , repatha , LDL at goal  PAD: Extensive history, carotid as well as femoral disease.  - Not a candidate for advanced therapies - Continue eliquis , plavix , aggressive risk factor modification - Smoking cessation encouraged  IDA:  - Noted Hgb down trending during last admission - Hgb 16.9 5/25> 11.3 7/25> 10.4>10.1  - tSat 7 and ferritin 63, will arrange IV iron  - Denies abnormal bleeding - Continue Eliquis  5 mg BID - CBC today, may need referral to GI if it continues to downtrend  DM: - Uncontrolled. Last A1c 12.5 8/25 - Has f/u with PCP next month - Reports compliance with insulin  regimen - Off SGLT2i for now  ? [place]   Deanndra Kirley PA-C  Advanced Heart Failure 08/05/24

## 2024-08-08 ENCOUNTER — Encounter: Payer: Self-pay | Admitting: Pulmonary Disease

## 2024-08-10 ENCOUNTER — Ambulatory Visit

## 2024-08-10 DIAGNOSIS — I483 Typical atrial flutter: Secondary | ICD-10-CM | POA: Diagnosis not present

## 2024-08-17 NOTE — Progress Notes (Addendum)
 Electrophysiology Office Note:   Date:  08/18/2024  ID:  Ryan [name], DOB [birth year], MRN [id number]  Primary Cardiologist: [name], MD Electrophysiologist: Fonda Kitty, MD      History of Present Illness:   Ryan [name] is a 58 y.o. male with h/o CAD s/p STEMI 12/2011 with DES to LAD, multiple PCIs - 10/2016, 07/2017, and 05/2019, CABG in 05/2020, NSTEMI 03/2022 with DES, PAD s/p aorto-bifem bypass in 2014, L carotid to subclavian bypass in 04/2013, R fem to AK popliteal bypass and R fem endarterectomy, angioplasty to right CFA 06/2021 and 12/2022, L femoral endarterectomy 01/2023, HFrEF, HTN, DM, COPD, tobacco use who is being seen today for evaluation of his atrial flutter at the request of Dr. Zenaida.   Discussed the use of AI scribe software for clinical note transcription with the patient, who gave verbal consent to proceed.  History of Present Illness Ryan [name] is a 58 year old male with atrial flutter who presents with episodes of atrial fibrillation.  He has a history of atrial flutter and underwent CTI ablation on 05/05/24 with ILR implant. Post ablation, ILR has detected episodes of atrial fibrillation characterized by sensations of his heart 'pounding, racing, skipping', which catch him off guard and make him feel unwell.  In March, an echocardiogram revealed an ejection fraction of about 25% while in atrial flutter. Following the procedure, a repeat echocardiogram a month ago showed improvement, with his heart function increasing to 45%.  He is currently on amiodarone  to manage his condition. But would like to avoid this long-term due to lung disease.   Review of systems complete and found to be negative unless listed in HPI.   EP Information / Studies Reviewed:    EKG is not ordered today. EKG from 07/18/24 reviewed which showed SR with first degree AV block and PVCs.      03/01/24: AFL   Echo 07/18/24:  1. Difficult acoustic windows Hypokinesis/ akinesis of the  distal lateral  and apical walls.. Left ventricular ejection fraction, by estimation, is  45%. The left ventricle has mildly decreased function. The left ventricle  demonstrates global hypokinesis.   Left ventricular diastolic parameters are consistent with Grade II  diastolic dysfunction (pseudonormalization).   2. Right ventricular systolic function is low normal. The right  ventricular size is normal.   3. Left atrial size was mildly dilated.   4. Trivial mitral valve regurgitation.   5. AV is thickened, calcified with minimally restricted motion Mean  gradeint through the valve is 13 mm Hg. . The aortic valve is tricuspid.  Aortic valve regurgitation is not visualized. Mild aortic valve stenosis.   6. The inferior vena cava is normal in size with greater than 50%  respiratory variability, suggesting right atrial pressure of 3 mmHg.    Risk Assessment/Calculations:    CHA2DS2-VASc Score = 4   This indicates a 4.8% annual risk of stroke. The patient's score is based upon: CHF History: 1 HTN History: 1 Diabetes History: 1 Stroke History: 0 Vascular Disease History: 1 Age Score: 0 Gender Score: 0             Physical Exam:   VS:  BP (!) 112/56   Pulse 62   Ht 5' 9 (1.753 m)   Wt 191 lb 9.6 oz (86.9 kg)   SpO2 (!) 82%   BMI 28.29 kg/m    Wt Readings from Last 3 Encounters:  08/18/24 191 lb 9.6 oz (86.9 kg)  07/31/24 216 lb (98 kg)  07/29/24 218 lb 12.8 oz (99.2 kg)     GEN: Well nourished, well developed in no acute distress NECK: No JVD CARDIAC: Normal rate, regular rhythm RESPIRATORY:  Clear to auscultation without rales, wheezing or rhonchi  ABDOMEN: Soft, non-distended EXTREMITIES: Trace edema; No deformity   ASSESSMENT AND PLAN:    #. Paroxysmal atrial fibrillation: Symptomatic. Diagnosed on ILR post AFL ablation. H/o drop in LVEF while in AFL. For this reason we have prioritized a rhythm control strategy. Would like to avoid long term amiodarone  d/t lung  disease.  #. Typical atrial flutter: S/p CTI ablation on 05/05/24. #. Secondary hypercoagulable state due to atrial flutter: CHA2DS2-VASc score of 4. #. High risk medication use: Antiarrhythmic drug therapy - amiodarone .  #. S/p loop recorder -Discussed treatment options today for AF including antiarrhythmic drug therapy and ablation. Discussed risks, recovery and likelihood of success with each treatment strategy. Risk, benefits, and alternatives to EP study and ablation for afib were discussed. These risks include but are not limited to stroke, bleeding, vascular damage, tamponade, perforation, damage to the esophagus, lungs, phrenic nerve and other structures, pulmonary vein stenosis, worsening renal function, coronary vasospasm and death.  Discussed potential need for repeat ablation procedures and antiarrhythmic drugs after an initial ablation. The patient understands these risk and wishes to proceed.  We will therefore proceed with catheter ablation at the next available time.  Carto, ICE, anesthesia are requested for the procedure.   We will not obtain CT PV protocol prior to the procedure. -Continue amiodarone  200mg  once daily. Given the patient's lung disease, amiodarone  is preferably only to be a short-term option, thus we will proceed with ablation as above. Will repeat TSH and LFTs. -Continue Eliquis  5 mg twice daily with no interruption.   #.  Chronic combined systolic and diastolic heart failure: EF improved from 25-30% in AFL to 45% in NSR. -Continue home GDMT regimen of Entresto  24-26 mg twice daily, spironolactone  25 mg once daily, carvedilol  6.25 mg twice daily.     #.  History of LV thrombus: This occurred years ago in the setting of acute MI.  He has been on Eliquis  he states for at least 3 years.  No LV thrombus visualized on echo from 03/02/2024. -New Eliquis  as above.   #.  CAD status post multiple PCI's and CABG: Currently denies chest pain.  Troponins are similar to  baseline. #. Hyperlipidemia:  -Continue clopidogrel  75 mg once daily. -Continue atorvastatin  80 mg daily. Continue Zetia  10 mg daily.  Follow up with Dr. Kennyth 3 months after ablation.   Signed, Fonda Kennyth, MD

## 2024-08-18 ENCOUNTER — Ambulatory Visit: Attending: Cardiology | Admitting: Cardiology

## 2024-08-18 ENCOUNTER — Ambulatory Visit

## 2024-08-18 ENCOUNTER — Other Ambulatory Visit: Payer: Self-pay

## 2024-08-18 ENCOUNTER — Encounter: Payer: Self-pay | Admitting: Cardiology

## 2024-08-18 VITALS — BP 112/56 | HR 62 | Ht 69.0 in | Wt 191.6 lb

## 2024-08-18 DIAGNOSIS — I2510 Atherosclerotic heart disease of native coronary artery without angina pectoris: Secondary | ICD-10-CM

## 2024-08-18 DIAGNOSIS — I5042 Chronic combined systolic (congestive) and diastolic (congestive) heart failure: Secondary | ICD-10-CM

## 2024-08-18 DIAGNOSIS — Z9889 Other specified postprocedural states: Secondary | ICD-10-CM

## 2024-08-18 DIAGNOSIS — I236 Thrombosis of atrium, auricular appendage, and ventricle as current complications following acute myocardial infarction: Secondary | ICD-10-CM | POA: Insufficient documentation

## 2024-08-18 DIAGNOSIS — Z79899 Other long term (current) drug therapy: Secondary | ICD-10-CM | POA: Diagnosis not present

## 2024-08-18 DIAGNOSIS — D6869 Other thrombophilia: Secondary | ICD-10-CM | POA: Diagnosis not present

## 2024-08-18 DIAGNOSIS — I483 Typical atrial flutter: Secondary | ICD-10-CM | POA: Insufficient documentation

## 2024-08-18 NOTE — Patient Instructions (Addendum)
 Medication Instructions:  Your physician recommends that you continue on your current medications as directed. Please refer to the Current Medication list given to you today.  *If you need a refill on your cardiac medications before your next appointment, please call your pharmacy*  Testing/Procedures: Ablation Your physician has recommended that you have an ablation. Catheter ablation is a medical procedure used to treat some cardiac arrhythmias (irregular heartbeats). During catheter ablation, a long, thin, flexible tube is put into a blood vessel in your groin (upper thigh), or neck. This tube is called an ablation catheter. It is then guided to your heart through the blood vessel. Radio frequency waves destroy small areas of heart tissue where abnormal heartbeats may cause an arrhythmia to start.   You are scheduled for Atrial Fibrillation Ablation on Wednesday, December 10th with Dr. Sidra Kitty.Please arrive at the Main Entrance A at [facility]: [address] [place], [place] [address] at 6:30am  What To Expect:  Labs: you will need to have lab work drawn within 30 days of your procedure. Please go to any [facility] location to have these drawn - no appointment is needed. You will receive procedure instructions either through MyChart or in the mail 4-6 week prior to your procedure.  After your procedure we recommend no driving for 4 days, no lifting over 5 lbs for 7 days, and no work or strenuous activity for 7 days.  Please contact our office at [phone] if you have any questions.    Follow-Up: We will contact you to schedule your post-procedure appointments.

## 2024-08-19 LAB — CUP PACEART REMOTE DEVICE CHECK
Date Time Interrogation Session: 20250915093100
Implantable Pulse Generator Implant Date: 20250605
Pulse Gen Serial Number: 148873

## 2024-08-21 NOTE — Progress Notes (Signed)
 Remote Loop Recorder Transmission

## 2024-08-24 ENCOUNTER — Ambulatory Visit: Payer: Self-pay | Admitting: Cardiology

## 2024-08-25 ENCOUNTER — Ambulatory Visit: Admitting: Gastroenterology

## 2024-08-25 NOTE — Progress Notes (Deleted)
 Ryan [name] [id number] Dec 26, 1965   Chief Complaint:  Referring Provider: [name], MD Primary GI MD:   HPI: Ryan [name] is a 58 y.o. male with past medical history of CAD s/p STEMI 12/2011 with DES to LAD, multiple PCIs - 10/2016, 07/2017, and 05/2019, CABG in 05/2020, NSTEMI 03/2022 with DES, PAD s/p aorto-bifem bypass in 2014, L carotid to subclavian bypass in 04/2013, R fem to AK popliteal bypass and R fem endarterectomy, angioplasty to right CFA 06/2021 and 12/2022, L femoral endarterectomy 01/2023, HFrEF, HTN, DM, COPD, GERD, tobacco use who presents today for a complaint of rectal bleeding and to discuss colonoscopy.    Last visit with cardiology 08/18/2024.  History of atrial fibrillation and atrial flutter s/p CTI ablation on 05/05/2024 and is scheduled for ablation in December.  He is on Eliquis  and Plavix .  Last colonoscopy 07/25/2010 with finding of anal fissure, hyperplastic polyps in the rectum.  Per chart review has had anal fissurectomy.    Previous GI Procedures/Imaging   Colonoscopy 07/25/2010 - Possible anal fissure, very tender anal canal, diminutive rectal polyp (2) s/p cold biopsy removal, remainder of the rectal mucosa appeared normal. - Normal colon. Path: Hyperplastic polyps   Past Medical History:  Diagnosis Date   Acute myocardial infarction of other anterior wall, subsequent episode of care 12/12/2011   Anal fissure    Arthritis    back   Atherosclerosis of native artery of both lower extremities with intermittent claudication (HCC) 04/01/2018   Bruises easily    d/t being on Effient    CAD (coronary artery disease)    A. Acute Ant STEMI 12/12/2011    Cardiomyopathy secondary 12/26/2011   Chronic systolic CHF (congestive heart failure) (HCC)    ischemic CM // Echo 6/21: EF 30-35  //  Echocardiogram 10/21: LV apical thrombus resolved, EF 50-55, ant and apical, ant-lat HK, trivial MR, trivial AI   Chronic total occlusion of artery of the extremities (HCC)  02/26/2012   Claudication (HCC) 12/26/2011   Coronary atherosclerosis of native coronary artery    a. ant STEMI with cardiac arrest 2013 s/p DES to mLAD. b. [place]  10/2016 s/p PTCA to mLAD.   Diabetes mellitus without complication (HCC)    Essential hypertension 10/23/2016   GERD (gastroesophageal reflux disease)    takes tums   History of blood transfusion    no abnormal reaction noted   History of kidney stones    Hypertension    Ischemic cardiomyopathy    a. EF 35% in 02/2012 at time of acute MI, improved to normal on subsequent imaging.   MI (myocardial infarction) (HCC)    AMI 1/13 - complicated by VT/Tosades   Mixed hyperlipidemia    Numbness and tingling of right arm 03/31/2013   Occlusion and stenosis of carotid artery without mention of cerebral infarction 08/26/2012   PAD (peripheral artery disease) (HCC)    followed by Vascular - aortobifem bypass 01/2013 & left carotid-subclavian artery bypass 04/2013   Peripheral vascular disease, unspecified (HCC) 12/16/2012   PVD (peripheral vascular disease) (HCC) 03/31/2013   Rectal polyp    Subclavian steal syndrome 08/11/2013   Uncontrolled diabetes mellitus 10/23/2016   Wears partial dentures    upper    Past Surgical History:  Procedure Laterality Date   A-FLUTTER ABLATION N/A 05/05/2024   Procedure: A-FLUTTER ABLATION;  Surgeon: [name], MD;  Location: [facility] INVASIVE CV LAB;  Service: Cardiovascular;  Laterality: N/A;   ABDOMINAL AORTOGRAM W/LOWER EXTREMITY N/A 06/26/2017   Procedure: Abdominal  Aortogram w/Lower Extremity;  Surgeon: Serene Gaile ORN, MD;  Location: [facility] INVASIVE CV LAB;  Service: Cardiovascular;  Laterality: N/A;   ABDOMINAL AORTOGRAM W/LOWER EXTREMITY N/A 06/28/2021   Procedure: ABDOMINAL AORTOGRAM W/LOWER EXTREMITY;  Surgeon: Serene Gaile ORN, MD;  Location: [facility] INVASIVE CV LAB;  Service: Cardiovascular;  Laterality: N/A;   ABDOMINAL AORTOGRAM W/LOWER EXTREMITY N/A 01/02/2023   Procedure: ABDOMINAL AORTOGRAM W/LOWER  EXTREMITY;  Surgeon: Serene Gaile ORN, MD;  Location: [facility] INVASIVE CV LAB;  Service: Cardiovascular;  Laterality: N/A;   ANAL FISSURECTOMY     AORTA - BILATERAL FEMORAL ARTERY BYPASS GRAFT N/A 01/16/2013   Procedure: AORTA BIFEMORAL BYPASS GRAFT;  Surgeon: Gaile ORN Serene, MD;  Location: [facility] OR;  Service: Vascular;  Laterality: N/A;   APPENDECTOMY     CARDIAC CATHETERIZATION N/A 10/23/2016   Procedure: Left Heart Cath and Coronary Angiography;  Surgeon: Lonni JONETTA Cash, MD;  Location: [facility] INVASIVE CV LAB;  Service: Cardiovascular;  Laterality: N/A;   CARDIAC CATHETERIZATION N/A 10/23/2016   Procedure: Coronary Balloon Angioplasty;  Surgeon: Lonni JONETTA Cash, MD;  Location: [facility] INVASIVE CV LAB;  Service: Cardiovascular;  Laterality: N/A;   CARDIOVERSION N/A 03/31/2024   Procedure: CARDIOVERSION;  Surgeon: Zenaida Morene PARAS, MD;  Location: [facility] INVASIVE CV LAB;  Service: Cardiovascular;  Laterality: N/A;   CAROTID-SUBCLAVIAN BYPASS GRAFT Left 04/03/2013   Procedure: BYPASS GRAFT CAROTID-SUBCLAVIAN;  Surgeon: Gaile ORN Serene, MD;  Location: [facility] OR;  Service: Vascular;  Laterality: Left;   CIRCUMCISION N/A 12/15/2015   Procedure: CIRCUMCISION ADULT;  Surgeon: Belvie LITTIE Clara, MD;  Location: [facility] ORS;  Service: Urology;  Laterality: N/A;   COLONOSCOPY     CORONARY ANGIOPLASTY     stent placed Dec 12, 2011 and 2018   CORONARY ARTERY BYPASS GRAFT N/A 05/05/2020   Procedure: CORONARY ARTERY BYPASS GRAFTING (CABG) x Three, Using Left internal Mammary Artery and Left Leg greater saphenous vein harvested endoscopically;  Surgeon: Army Dallas NOVAK, MD;  Location: [facility] OR;  Service: Open Heart Surgery;  Laterality: N/A;   CORONARY BALLOON ANGIOPLASTY N/A 05/23/2019   Procedure: CORONARY BALLOON ANGIOPLASTY;  Surgeon: Cash Lonni JONETTA, MD;  [facility] INVAS[facility] CV LAB;  Service: Cardiovascular;  Laterality: N/A;   CORONARY PRESSURE/FFR STUDY N/A 04/22/2020   Procedure: INTRAVASCULAR PRESSURE WIRE/FFR  STUDY;  Surgeon: Cash Lonni JONETTA, MD;  [facility] INVASIVE CV LAB;  Service: Cardiovascular;  Laterality: N/A;   DIAGNOSTIC LAPAROSCOPY     ENDARTERECTOMY FEMORAL Right 03/13/2018   Procedure: FEMORAL ENDARTERECTOMY RIGHT;  Surgeon: Serene Gaile ORN, MD;  [facility] OR;  Service: Vascular;  Laterality: Right;   ENDARTERECTOMY FEMORAL Left 01/26/2023   Procedure: REDO LEFT COMMON FEMORAL ENDARTERECTOMY WITH VPA;  Surgeon: Serene Gaile ORN, MD;  Location: [facility] OR;  Service: Vascular;  Laterality: Left;   FEMORAL-POPLITEAL BYPASS GRAFT Right 03/13/2018   Procedure: FEMORAL-POPLITEAL ARTERY BYPASS WITH NON-REVERSE VEIN RIGHT;  Surgeon: Serene Gaile ORN, MD;  Location: [facility] OR;  Service: Vascular;  Laterality: Right;   GROIN DISSECTION Right 03/13/2018   Procedure: RE-DO COMMON FEMORAL ARTERY EXPOSURE;  Surgeon: Serene Gaile ORN, MD;  Location: [facility] OR;  Service: Vascular;  Laterality: Right;   I & D EXTREMITY Left 04/18/2013   Procedure: debridement of left neck lymphocele;  Surgeon: Gaile ORN Serene, MD;  Location: [facility] OR;  Service: Vascular;  Laterality: Left;  I and D of left neck   I & D EXTREMITY Right 11/21/2014   Procedure: IRRIGATION AND DEBRIDEMENT RIGHT HAND;  Surgeon: Elsie Mussel, MD;  Location: [facility] OR;  Service: Orthopedics;  Laterality: Right;   LEFT HEART CATH AND CORONARY ANGIOGRAPHY N/A 05/23/2019   Procedure: LEFT HEART CATH AND CORONARY ANGIOGRAPHY;  Surgeon: Verlin Lonni BIRCH, MD;  [facility] INVASIVE CV LAB;  Service: Cardiovascular;  Laterality: N/A;   LEFT HEART CATH AND CORONARY ANGIOGRAPHY N/A 04/22/2020   Procedure: LEFT HEART CATH AND CORONARY ANGIOGRAPHY;  Surgeon: Verlin Lonni BIRCH, MD;  [facility] INVASIVE CV LAB;  Service: Cardiovascular;  Laterality: N/A;   LEFT HEART CATHETERIZATION WITH CORONARY ANGIOGRAM N/A 12/12/2011   Procedure: LEFT HEART CATHETERIZATION WITH CORONARY ANGIOGRAM;  Surgeon: Lonni BIRCH Verlin, MD;  Location: [facility] CATH LAB;  Service:  Cardiovascular;  Laterality: N/A;   LOOP RECORDER INSERTION N/A 05/05/2024   Procedure: LOOP RECORDER INSERTION;  Surgeon: [name], MD;  Location: [facility] INVASIVE CV LAB;  Service: Cardiovascular;  Laterality: N/A;   LOWER EXTREMITY ANGIOGRAM N/A 01/31/2012   Procedure: LOWER EXTREMITY ANGIOGRAM;  Surgeon: Lonni BIRCH Verlin, MD;  Location: [facility] CATH LAB;  Service: Cardiovascular;  Laterality: N/A;   LOWER EXTREMITY ANGIOGRAPHY N/A 04/23/2018   Procedure: LOWER EXTREMITY ANGIOGRAPHY;  Surgeon: Serene Gaile ORN, MD;  [facility] INVASIVE CV LAB;  Service: Cardiovascular;  Laterality: N/A;   PATCH ANGIOPLASTY Left 01/26/2023   Procedure: PATCH ANGIOPLASTY USING HEMASHIELD PLATINUM FINESSE;  Surgeon: Serene Gaile ORN, MD;  Location[facility] OR;  Service: Vascular;  Laterality: Left;   PERCUTANEOUS CORONARY STENT INTERVENTION (PCI-S) Right 12/12/2011   Procedure: PERCUTANEOUS CORONARY STENT INTERVENTION (PCI-S);  Surgeon: Lonni BIRCH Verlin, MD;  Location: [facility] CATH LAB;  Service: Cardiovascular;  Laterality: Right;   PERIPHERAL VASCULAR BALLOON ANGIOPLASTY Right 06/28/2021   Procedure: PERIPHERAL VASCULAR BALLOON ANGIOPLASTY;  Surgeon: Serene Gaile ORN, MD;  [facility] INVASIVE CV LAB;  Service: Cardiovascular;  Laterality: Right;  common femoral (graft)   PERIPHERAL VASCULAR BALLOON ANGIOPLASTY  01/02/2023   Procedure: PERIPHERAL VASCULAR BALLOON ANGIOPLASTY;  Surgeon: Serene Gaile ORN, MD;  [facility] INVASIVE CV LAB;  Service: Cardiovascular;;   PERIPHERAL VASCULAR INTERVENTION Left 04/23/2018   Procedure: PERIPHERAL VASCULAR INTERVENTION;  Surgeon: Serene Gaile ORN, MD;  [facility] INVASIVE CV LAB;  Service: Cardiovascular;  Laterality: Left;  superficial femoral   REPAIR EXTENSOR TENDON Right 11/21/2014   Procedure: WITH REPAIR/RECONSTRUCTION OF EXTENSOR TENDONS AS NEEDED;  Surgeon: Elsie Mussel, MD;  Location: [facility] OR;  Service: Orthopedics;  Laterality: Right;   TEE WITHOUT CARDIOVERSION N/A  05/05/2020   Procedure: TRANSESOPHAGEAL ECHOCARDIOGRAM (TEE);  Surgeon: Army Dallas NOVAK, MD;  Location: [facility] OR;  Service: Open Heart Surgery;  Laterality: N/A;    Current Outpatient Medications  Medication Sig Dispense Refill   albuterol  (VENTOLIN  HFA) 108 (90 Base) MCG/ACT inhaler Inhale 1-2 puffs into the lungs every 6 (six) hours as needed for wheezing or shortness of breath.     amiodarone  (PACERONE ) 200 MG tablet TAKE 1 TABLET BY MOUTH EVERY DAY 90 tablet 1   cetirizine (ZYRTEC) 10 MG tablet Take 10 mg by mouth daily as needed for allergies.     clopidogrel  (PLAVIX ) 75 MG tablet TAKE 1 TABLET BY MOUTH EVERY DAY 30 tablet 1   diazepam  (VALIUM ) 10 MG tablet Take 10 mg by mouth at bedtime.     ELIQUIS  5 MG TABS tablet TAKE 1 TABLET BY MOUTH 2 TIMES A DAY 180 tablet 1   ezetimibe  (ZETIA ) 10 MG tablet TAKE 1 TABLET BY MOUTH EACH DAY 90 tablet 5   gabapentin  (NEURONTIN ) 300 MG capsule Take 300 mg by mouth 2 (  two) times daily.     icosapent  Ethyl (VASCEPA ) 1 g capsule TAKE 2 CAPSULES BY MOUTH TWICE DAILY 360 capsule 3   insulin  glargine (LANTUS ) 100 UNIT/ML Solostar Pen Inject 50 Units into the skin 2 (two) times daily.     insulin  lispro (HUMALOG ) 100 UNIT/ML injection Inject 5-15 Units into the skin 3 (three) times daily as needed (blood sugar over 150).     metolazone  (ZAROXOLYN ) 5 MG tablet Take 1 tablet (5 mg total) by mouth as directed. TAKE 1 TABLET 5MG  TODAY, AND TAKE 1 TABLET 5MG  TOMORROW, TAKE AN EXTRA OF POTASSIUM WITH METOLAZONE  DOSAGE 5 tablet 0   nitroGLYCERIN  (NITROSTAT ) 0.4 MG SL tablet Place 0.4 mg under the tongue every 5 (five) minutes as needed for chest pain.     oxyCODONE -acetaminophen  (PERCOCET) 10-325 MG tablet Take 1 tablet by mouth 4 (four) times daily.     pantoprazole  (PROTONIX ) 40 MG tablet Take 40 mg by mouth daily.     potassium chloride  SA (KLOR-CON  M) 20 MEQ tablet TAKE 1 TABLET BY MOUTH 2 TIMES A DAY 180 tablet 3   ramelteon  (ROZEREM ) 8 MG tablet Take 1  tablet (8 mg total) by mouth at bedtime. 30 tablet 2   REPATHA  SURECLICK 140 MG/ML SOAJ INJECT 140 MG INTO THE SKIN EVERY 14 DAYS AS DIRECTED 6 mL 3   spironolactone  (ALDACTONE ) 25 MG tablet Take 1 tablet (25 mg total) by mouth every evening. 30 tablet 11   Tiotropium Bromide-Olodaterol (STIOLTO RESPIMAT ) 2.5-2.5 MCG/ACT AERS Inhale 2 puffs into the lungs daily. 4 g 3   torsemide  (DEMADEX ) 20 MG tablet Take 2 tablets (40 mg total) by mouth 2 (two) times daily. 120 tablet 3   TRINTELLIX  20 MG TABS tablet Take 20 mg by mouth daily in the afternoon.     Vitamin D, Ergocalciferol, (DRISDOL) 1.25 MG (50000 UNIT) CAPS capsule Take 50,000 Units by mouth once a week.     No current facility-administered medications for this visit.    Allergies as of 08/25/2024 - Review Complete 08/18/2024  Allergen Reaction Noted   Metformin Anaphylaxis 02/18/2021   Hydrocodone  Hives and Nausea And Vomiting 08/29/2012    Family History  Problem Relation Age of Onset   Cancer Father        Lung   Diabetes Mother    COPD Mother    Cancer Maternal Uncle        Colon    Social History   Tobacco Use   Smoking status: Some Days    Current packs/day: 0.25    Average packs/day: 0.3 packs/day for 25.0 years (6.3 ttl pk-yrs)    Types: Cigarettes   Smokeless tobacco: Never   Tobacco comments:    Smoking cessation  Vaping Use   Vaping status: Never Used  Substance Use Topics   Alcohol use: Yes    Alcohol/week: 2.0 standard drinks of alcohol    Types: 2 Cans of beer per week    Comment: socially   Drug use: No     Review of Systems:    Constitutional: No weight loss, fever, chills, weakness or fatigue Eyes: No change in vision Ears, Nose, Throat:  No change in hearing or congestion Skin: No rash or itching Cardiovascular: No chest pain, chest pressure or palpitations   Respiratory: No SOB or cough Gastrointestinal: See HPI and otherwise negative Genitourinary: No dysuria or change in urinary  frequency Neurological: No headache, dizziness or syncope Musculoskeletal: No new muscle or joint pain Hematologic:  No bleeding or bruising    Physical Exam:  Vital signs: There were no vitals taken for this visit.  Constitutional: NAD, Well developed, Well nourished, alert and cooperative Head:  Normocephalic and atraumatic.  Eyes: No scleral icterus. Conjunctiva pink. Mouth: No oral lesions. Respiratory: Respirations even and unlabored. Lungs clear to auscultation bilaterally.  No wheezes, crackles, or rhonchi.  Cardiovascular:  Regular rate and rhythm. No murmurs. No peripheral edema. Gastrointestinal:  Soft, nondistended, nontender. No rebound or guarding. Normal bowel sounds. No appreciable masses or hepatomegaly. Rectal:  Not performed.  Neurologic:  Alert and oriented x4;  grossly normal neurologically.  Skin:   Dry and intact without significant lesions or rashes. Psychiatric: Oriented to person, place and time. Demonstrates good judgement and reason without abnormal affect or behaviors.   RELEVANT LABS AND IMAGING: CBC    Component Value Date/Time   WBC 7.8 07/29/2024 0856   RBC 4.28 07/29/2024 0856   HGB 10.0 (L) 07/29/2024 0856   HGB 11.3 (L) 06/26/2024 0934   HCT 33.8 (L) 07/29/2024 0856   HCT 37.6 06/26/2024 0934   PLT 300 07/29/2024 0856   PLT 234 06/26/2024 0934   MCV 79.0 (L) 07/29/2024 0856   MCV 90 06/26/2024 0934   MCH 23.4 (L) 07/29/2024 0856   MCHC 29.6 (L) 07/29/2024 0856   RDW 21.2 (H) 07/29/2024 0856   RDW 16.4 (H) 06/26/2024 0934   LYMPHSABS 2.0 03/29/2022 1746   MONOABS 1.0 03/29/2022 1746   EOSABS 0.2 03/29/2022 1746   BASOSABS 0.1 03/29/2022 1746    CMP     Component Value Date/Time   NA 136 07/29/2024 0856   NA 134 06/26/2024 0934   K 4.6 07/29/2024 0856   CL 98 07/29/2024 0856   CO2 28 07/29/2024 0856   GLUCOSE 209 (H) 07/29/2024 0856   BUN 21 (H) 07/29/2024 0856   BUN 20 06/26/2024 0934   CREATININE 1.45 (H) 07/29/2024 0856    CREATININE 0.98 10/11/2016 0941   CALCIUM  8.9 07/29/2024 0856   PROT 7.3 06/26/2024 0934   ALBUMIN  4.0 06/26/2024 0934   AST 29 06/26/2024 0934   ALT 27 06/26/2024 0934   ALKPHOS 219 (H) 06/26/2024 0934   BILITOT 0.7 06/26/2024 0934   GFRNONAA 56 (L) 07/29/2024 0856   GFRAA 87 06/18/2020 0945   Echocardiogram 07/18/2024 1. Difficult acoustic windows Hypokinesis/ akinesis of the distal lateral and apical walls. . Left ventricular ejection fraction, by estimation, is 45% . The left ventricle has mildly decreased function. The left ventricle demonstrates global hypokinesis. Left ventricular diastolic parameters are consistent with Grade II diastolic dysfunction ( pseudonormalization) .  2. Right ventricular systolic function is low normal. The right ventricular size is normal.  3. Left atrial size was mildly dilated.  4. Trivial mitral valve regurgitation.  5. AV is thickened, calcified with minimally restricted motion Mean gradeint through the valve is 13 mm Hg. . The aortic valve is tricuspid. Aortic valve regurgitation is not visualized. Mild aortic valve stenosis.  6. The inferior vena cava is normal in size with greater than 50% respiratory variability, suggesting right atrial pressure of 3 mmHg.  Assessment/Plan:   Procedure in hospital setting Cardiac clearance for procedure and permission to hold blood thinner   Camie Furbish, PA-C [facility] 08/25/2024, 7:36 AM  Patient Care Team: [name], MD as PCP - General (Internal Medicine) Verlin Lonni BIRCH, MD as PCP - Cardiology (Cardiology) [name], MD as PCP - Electrophysiology (Cardiology) Zenaida Morene PARAS, MD as PCP -  Advanced Heart Failure (Cardiology)

## 2024-08-25 NOTE — Progress Notes (Signed)
 Remote Loop Recorder Transmission

## 2024-08-30 ENCOUNTER — Other Ambulatory Visit: Payer: Self-pay | Admitting: Cardiovascular Disease

## 2024-08-30 NOTE — Progress Notes (Signed)
 Remote Loop Recorder Transmission

## 2024-08-31 ENCOUNTER — Ambulatory Visit (HOSPITAL_BASED_OUTPATIENT_CLINIC_OR_DEPARTMENT_OTHER): Admitting: Pulmonary Disease

## 2024-09-10 ENCOUNTER — Encounter

## 2024-09-11 ENCOUNTER — Encounter

## 2024-09-15 NOTE — Progress Notes (Signed)
 Remote Loop Recorder Transmission

## 2024-09-18 ENCOUNTER — Ambulatory Visit (INDEPENDENT_AMBULATORY_CARE_PROVIDER_SITE_OTHER)

## 2024-09-18 DIAGNOSIS — I483 Typical atrial flutter: Secondary | ICD-10-CM

## 2024-09-19 LAB — CUP PACEART REMOTE DEVICE CHECK
Date Time Interrogation Session: 20251016101000
Implantable Pulse Generator Implant Date: 20250605
Pulse Gen Serial Number: 148873

## 2024-09-25 NOTE — Progress Notes (Signed)
 Remote Loop Recorder Transmission

## 2024-09-27 ENCOUNTER — Ambulatory Visit: Payer: Self-pay | Admitting: Cardiology

## 2024-09-27 ENCOUNTER — Other Ambulatory Visit: Payer: Self-pay | Admitting: Cardiovascular Disease

## 2024-09-27 DIAGNOSIS — I513 Intracardiac thrombosis, not elsewhere classified: Secondary | ICD-10-CM

## 2024-09-27 DIAGNOSIS — I4891 Unspecified atrial fibrillation: Secondary | ICD-10-CM

## 2024-09-29 NOTE — Telephone Encounter (Signed)
 Prescription refill request for Eliquis  received. Indication: AF Last office visit: 9/25 [place] Scr: 1.45 Age: 58 Weight: 86.9

## 2024-10-09 ENCOUNTER — Encounter (HOSPITAL_BASED_OUTPATIENT_CLINIC_OR_DEPARTMENT_OTHER): Admitting: Pulmonary Disease

## 2024-10-09 ENCOUNTER — Encounter (HOSPITAL_BASED_OUTPATIENT_CLINIC_OR_DEPARTMENT_OTHER): Payer: Self-pay | Admitting: Pulmonary Disease

## 2024-10-11 ENCOUNTER — Encounter

## 2024-10-13 ENCOUNTER — Encounter

## 2024-10-20 ENCOUNTER — Encounter

## 2024-10-20 ENCOUNTER — Telehealth (HOSPITAL_COMMUNITY): Payer: Self-pay | Admitting: Cardiology

## 2024-10-20 NOTE — Telephone Encounter (Signed)
 Called to confirm/remind patient of their appointment at the [facility] on 10/20/2024.   Appointment:   [x] Confirmed  [] Left mess   [] No answer/No voice mail  [] VM Full/unable to leave message  [] Phone not in service  Patient reminded to bring all medications and/or complete list.  Confirmed patient has transportation. Gave directions, instructed to utilize valet parking.

## 2024-10-21 ENCOUNTER — Ambulatory Visit (HOSPITAL_COMMUNITY): Admission: RE | Admit: 2024-10-21 | Source: Ambulatory Visit | Admitting: Cardiology

## 2024-10-22 ENCOUNTER — Telehealth: Payer: Self-pay

## 2024-10-22 ENCOUNTER — Encounter (HOSPITAL_COMMUNITY): Payer: Self-pay

## 2024-10-22 ENCOUNTER — Telehealth (HOSPITAL_COMMUNITY): Payer: Self-pay

## 2024-10-22 NOTE — Telephone Encounter (Signed)
-----   Message from Nurse Kymani Shimabukuro C sent at 08/18/2024 11:36 AM EDT ----- Regarding: 12/10 afib ablation Precert:  MD: Kennyth Type of ablation: A-fib Diagnosis: A-fib CPT code: A-fib (06343) Ablation scheduled (date/time): 12/10 at 830am  Procedure:  Added to calendar? Yes Orders entered? Yes Letter complete? No, >30 days before procedure Scheduled with cath lab? Yes Any medications to hold? Yes (please list hold instructions): insulin   Labs ordered (CBC, BMET, PT/INR if on warfarin): Yes Mapping system: Doesn't matter CARTO/OPAL rep notified? No Cardiac CT needed? No Dye allergy? No Pre-meds ordered and instructions given? No, not needed Letter method: MyChart H&P: 9/15 Device: Yes, BSX ILR  Follow-up:  Cassie/Angel, please schedule Routine.  Covering RN - please send this message to [employer], EP scheduler, EP Scheduling pool, EP [employer], and CT scheduler (Brittany Lynch/Stephanie Mogg), if indicated.

## 2024-10-22 NOTE — Telephone Encounter (Signed)
 Spoke with patient to discuss upcoming procedure. He gave verbal permission to speak with his daughter, Jonette Bihari.   CT: not needed.  Labs: to be completed 11/21.   Any recent signs of acute illness or been started on antibiotics? No Any new medications started? No Any medications to hold?  Yes  Lantus  (Insulin ) - If you take at Dinner/Bedtime - ONLY take 1/2 of your usual dose the night before your procedure. If you take in the Morning - ONLY take 1/2 of your usual dose the morning of your procedure. Humalog  (Insulin ): No bedtime dose of Humalog  the night before your procedure.  Day of procedure: Only take 1/2 of usual dose of Humalog  if CBG greater than 220 mg/dL. Any missed doses of blood thinner?  No Advised for patient to continue taking Eliquis  (Apixaban ) twice daily without missing any doses.  Medication instructions:  On the morning of your procedure DO NOT take any medication., including Eliquis  (Apixaban ) or the procedure may be rescheduled. Nothing to eat or drink after midnight prior to your procedure.  Confirmed patient is scheduled for Atrial Fibrillation Ablation on Wednesday, December 10 with Dr. Dr. Kennyth. Instructed patient to arrive at the Main Entrance A at [facility]: [address] [place], [place] [address] and check in at Admitting at 6:30 AM.   Plan to go home the same day, you will only stay overnight if medically necessary. You MUST have a responsible adult to drive you home and MUST be with you the first 24 hours after you arrive home or your procedure could be cancelled.  Informed a nurse will call a day before the procedure to confirm arrival time and ensure instructions are followed.  Patient's daughter verbalized understanding to all instructions provided and agreed to proceed with procedure.   Advised to contact RN Navigator at [phone], to inform of any new medications started after call or concerns prior to procedure.

## 2024-10-23 ENCOUNTER — Ambulatory Visit: Payer: Self-pay

## 2024-10-23 ENCOUNTER — Ambulatory Visit (HOSPITAL_BASED_OUTPATIENT_CLINIC_OR_DEPARTMENT_OTHER): Attending: Pulmonary Disease | Admitting: Pulmonary Disease

## 2024-10-23 DIAGNOSIS — G4761 Periodic limb movement disorder: Secondary | ICD-10-CM | POA: Diagnosis not present

## 2024-10-23 DIAGNOSIS — R0683 Snoring: Secondary | ICD-10-CM | POA: Insufficient documentation

## 2024-10-23 DIAGNOSIS — I493 Ventricular premature depolarization: Secondary | ICD-10-CM | POA: Diagnosis not present

## 2024-10-23 DIAGNOSIS — G478 Other sleep disorders: Secondary | ICD-10-CM | POA: Diagnosis present

## 2024-10-23 NOTE — Telephone Encounter (Signed)
 FYI Only or Action Required?: Action required by provider: update on patient condition.  Patient is followed in Pulmonology, last seen on 07/31/2024 by Neda Jennet LABOR, MD.  Called Nurse Triage reporting Shortness of Breath.  Symptoms began several months ago.  Symptoms are: gradually worsening.  Triage Disposition: Go to ED Now (Notify PCP)  Patient/caregiver understands and will follow disposition?: No, wishes to speak with PCP             Copied from CRM [id number]. Topic: Clinical - Red Word Triage >> Oct 23, 2024  5:07 PM Lavanda D wrote: Red Word that prompted transfer to Nurse Triage: SOB/Trouble Breathing: Patient's daughter is calling with concerns of her fathers health, she said he does wear oxygen regularly but is constantly having SOB and general trouble breathing, she states that he cannot walk even 20 ft without giving out. He is scheduled for a sleep study but would like to get him in to see a pulmonologist as well. She also mentioned that his oxygen drops to the 70s on occasion. In addition to her concerns regarding his oxygen she would like to see about getting him a new order for supplies once he is seen. Pt's daughter not on DPR but on conference call with father. Reason for Disposition  [1] MODERATE difficulty breathing (e.g., speaks in phrases, SOB even at rest, pulse 100-120) AND [2] NEW-onset or WORSE than normal  Answer Assessment - Initial Assessment Questions This RN spoke with pt and pt's daughter, Jonette. This RN recommends pt goes to ED but pt refused. [facility] now closed. Pt is wanting a follow up appointment with pulmonology. Pt daughter call back number is [phone] and needs a call back.  SOB for 2 months, worsening Only with exertion Intermittent coughing, wheezing, dizziness, chest pain  2-3 L of o2 at all times; pt not sure current 98% O2 sat (with moving it goes to 70s%  RECURRENT SYMPTOM: Have you had difficulty breathing before? If Yes,  ask: When was the last time? and What happened that time?      Yes just getting worse per pt; pt states he has passed out 2-3 times (last happened 1 month ago, pt did lose consciousness)  Protocols used: Breathing Difficulty-A-AH

## 2024-10-24 NOTE — Telephone Encounter (Signed)
Dr. Olalere, please advise. 

## 2024-10-25 LAB — COMPREHENSIVE METABOLIC PANEL WITH GFR
ALT: 13 IU/L (ref 0–44)
AST: 24 IU/L (ref 0–40)
Albumin: 3.7 g/dL — AB (ref 3.8–4.9)
Alkaline Phosphatase: 294 IU/L — AB (ref 47–123)
BUN/Creatinine Ratio: 10 (ref 9–20)
BUN: 14 mg/dL (ref 6–24)
Bilirubin Total: 1.3 mg/dL — AB (ref 0.0–1.2)
CO2: 26 mmol/L (ref 20–29)
Calcium: 8.6 mg/dL — AB (ref 8.7–10.2)
Chloride: 89 mmol/L — AB (ref 96–106)
Creatinine, Ser: 1.39 mg/dL — AB (ref 0.76–1.27)
Globulin, Total: 3.7 g/dL (ref 1.5–4.5)
Glucose: 357 mg/dL — AB (ref 70–99)
Potassium: 3.6 mmol/L (ref 3.5–5.2)
Sodium: 133 mmol/L — AB (ref 134–144)
Total Protein: 7.4 g/dL (ref 6.0–8.5)
eGFR: 59 mL/min/1.73 — AB (ref >59)

## 2024-10-25 LAB — CBC
Hematocrit: 36.8 % — ABNORMAL LOW (ref 37.5–51.0)
Hemoglobin: 10.2 g/dL — ABNORMAL LOW (ref 13.0–17.7)
MCH: 22.5 pg — ABNORMAL LOW (ref 26.6–33.0)
MCHC: 27.7 g/dL — ABNORMAL LOW (ref 31.5–35.7)
MCV: 81 fL (ref 79–97)
Platelets: 225 x10E3/uL (ref 150–450)
RBC: 4.53 x10E6/uL (ref 4.14–5.80)
RDW: 18.3 % — ABNORMAL HIGH (ref 11.6–15.4)
WBC: 6.7 x10E3/uL (ref 3.4–10.8)

## 2024-10-25 LAB — T4, FREE: Free T4: 1.46 ng/dL (ref 0.82–1.77)

## 2024-10-25 LAB — TSH: TSH: 3.54 u[IU]/mL (ref 0.450–4.500)

## 2024-10-26 ENCOUNTER — Ambulatory Visit: Payer: Self-pay | Admitting: Cardiology

## 2024-10-28 ENCOUNTER — Encounter: Payer: Self-pay | Admitting: *Deleted

## 2024-10-28 ENCOUNTER — Ambulatory Visit: Attending: Cardiology

## 2024-10-28 DIAGNOSIS — I5042 Chronic combined systolic (congestive) and diastolic (congestive) heart failure: Secondary | ICD-10-CM | POA: Diagnosis not present

## 2024-10-28 NOTE — Telephone Encounter (Signed)
 Schedule for office follow-up

## 2024-10-28 NOTE — Telephone Encounter (Signed)
 ATC x2.  Left vm to return call regarding an appointment made for him to see Dr. Neda on 11/26 at 2:45 pm, to arrive at 2:30 pm to assess him symptoms.  Will await return call.

## 2024-10-28 NOTE — Telephone Encounter (Signed)
 ATC x1.  Left a vm offering appointment with Dr. Neda at 2:45 pm.  Ryan [name] message making him aware and to call to cancel if he cannot make it.

## 2024-10-29 ENCOUNTER — Ambulatory Visit: Admitting: Pulmonary Disease

## 2024-10-29 ENCOUNTER — Telehealth: Payer: Self-pay | Admitting: Pulmonary Disease

## 2024-10-29 DIAGNOSIS — G478 Other sleep disorders: Secondary | ICD-10-CM

## 2024-10-29 NOTE — Telephone Encounter (Signed)
 Called and spoke with patient, provided the results/recommendations per Dr. Neda.  He verbalized understanding.  He has a f/u with Dr. Neda on 11/13/24.  Nothing further needed.

## 2024-10-29 NOTE — Procedures (Signed)
 [facility] [address] [place], [place] [address] Tel: [phone]   Fax: [phone]  Polysomnography Interpretation  Patient Name:  Ryan [name], Ryan [name] Date:  10/23/2024 Referring Physician:  JENNET EPLEY [phone]) %%startinterp%% Indications for Polysomnography The patient is a 58 year old Male who is 5' 9 and weighs 205.0 lbs. His BMI equals 30.4.  A full night polysomnogram was performed to evaluate for -.  No Medications  No Data.   Polysomnogram Data A full night polysomnogram recorded the standard physiologic parameters including EEG, EOG, EMG, EKG, nasal and oral airflow.  Respiratory parameters of chest and abdominal movements were recorded with Respiratory Inductance Plethysmography belts.  Oxygen saturation was recorded by pulse oximetry.   Sleep Architecture The total recording time of the polysomnogram was 424.6 minutes.  The total sleep time was 201.0 minutes.  The patient spent 10.2% of total sleep time in Stage N1, 79.6% in Stage N2, 3.0% in Stages N3, and 7.2% in REM.  Sleep latency was 14.1 minutes.  REM latency was 243.5 minutes.  Sleep Efficiency was 47.3%.  Wake after Sleep Onset time was 209.0 minutes.  Respiratory Events The polysomnogram revealed a presence of - obstructive, - central, and - mixed apneas resulting in an Apnea index of - events per hour.  There were 13 hypopneas (>=3% desaturation and/or arousal) resulting in an Apnea\Hypopnea Index (AHI >=3% desaturation and/or arousal) of 3.9 events per hour.  There were 1 hypopnea (>=4% desaturation) resulting in an Apnea\Hypopnea Index (AHI >=4% desaturation) of 0.3 events per hour.  There were 4 Respiratory Effort Related Arousals resulting in a RERA index of 1.2 events per hour. The Respiratory Disturbance Index is 5.1 events per hour.  The snore index was - events per hour.  Mean oxygen saturation was 93.7%.  The lowest oxygen saturation during sleep was 87.0%.   Time spent <=88% oxygen saturation was 3.0 minutes (0.8%).  Limb Activity There were 149 total limb movements recorded, of this total, 140 were classified as PLMs.  PLM index was 41.8 per hour and PLM associated with Arousals index was 17.0 per hour.  Cardiac Summary The average pulse rate was 71.5 bpm.  The minimum pulse rate was 36.0 bpm while the maximum pulse rate was 90.0 bpm.  Cardiac rhythm was normal/abnormal.  Comments: Patient completed a diagnostic study.  Poor sleep efficiency with increased wake after sleep onset.  Study was performed on patient's usual oxygen supplementation at 2 L  Diagnosis:  Negative study for significant sleep disordered breathing. AHI of 3.9 with O2 nadir of 87%. Study was performed on patient's usual oxygen supplementation of 2 L Moderate to severe snoring was noted Poor sleep efficiency, increased wake after sleep onset  Moderate periodic limb movement  Cardiac rhythm was sinus with PVCs  Recommendations: Continue 2 L of oxygen supplementation at night. Further evaluation and management of periodic limb movement. Avoid alcohol, sedatives and other CNS depressants that may worsen sleep apnea and disrupt normal sleep architecture. Sleep hygiene should be reviewed to assess factors that may improve sleep quality. Weight management and regular exercise should be initiated or continue  Close clinical follow-up for optimization of treatment  This study was personally reviewed and electronically signed by: Dr. Jennet Epley Accredited Board Certified in Sleep Medicine Date/Time:   10/29/24  %%endinterp%%   Diagnostic PSG Report  Patient Name: Ryan [name], Ryan [name] Date: 10/23/2024  Date of Birth: 1966-10-29 Study Type: Diagnostic  Age: 58 year MRN #: [id number]  Sex: Male  Interpreting Physician: NEDA HAMMOND, [id number]  Height: 5' 9 Referring Physician: HAMMOND NEDA [phone])  Weight: 205.0 lbs Recording Tech: Dewane Hacker CRT  RPSGT RST  BMI: 30.4 Scoring Tech: Dewane Hacker CRT RPSGT RST  ESS: 7 Neck Size: 16  Mask Type -    Mask Size: - Supplemental O2: 2 LPM   Study Overview  Lights Off: 09:57:10 PM  Count Index  Lights On: 05:01:44 AM Awakenings: 15 4.5  Time in Bed: 424.6 min. Arousals: 127 37.9  Total Sleep Time: 201.0 min. AHI (>=3% Desat and/or Ar.): 13 3.9   Sleep Efficiency: 47.3% AHI (>=4% Desat): 1 0.3   Sleep Latency: 14.1 min. Limb Movements: 149 44.5  Wake After Sleep Onset: 209.0 min. Snore: - -  REM Latency from Sleep Onset: 243.5 min. Desaturations: 23 6.9     Minimum SpO2 TST: 87.0%    Sleep Architecture  % of Time in Bed Stages Time (mins) % Sleep Time  Wake 223.5   Stage N1 20.5 10.2%  Stage N2 160.0 79.6%  Stage N3 6.0 3.0%  REM 14.5 7.2%   Arousal Summary   NREM REM Sleep Index  Respiratory Arousals 4 4 8  2.4  PLM Arousals 56 1 57 17.0  Isolated Limb Movement Arousals 4 1 5  1.5  Snore Arousals - - - -  Spontaneous Arousals 52 5 57 17.0  Total 116 11 127 37.9   Limb Movement Summary   Count Index  Isolated Limb Movements 9 2.7  Periodic Limb Movements (PLMs) 140 41.8  Total Limb Movements 149 44.5    Respiratory Summary   By Sleep Stage By Body Position Total   NREM REM Supine Non-Supine   Time (min) 186.5 14.5 - 201.0 201.0         Obstructive Apnea - - - - -  Mixed Apnea - - - - -  Central Apnea - - - - -  Total Apneas - - - - -  Total Apnea Index - - - - -         Hypopneas (>=3% Desat and/or Ar.) 8 5 - 13 13  AHI (>=3% Desat and/or Ar.) 2.6 20.7 - 3.9 3.9         Hypopneas (>=4% Desat) 1 - - 1 1  AHI (>=4% Desat) 0.3 - - 0.3 0.3          RERAs 2 2 - 4 4  RERA Index 0.6 8.3 - 1.2 1.2         RDI 3.2 29.0 - 5.1 5.1    Respiratory Event Type Index  Central Apneas -  Obstructive Apneas -  Mixed Apneas -  Central Hypopneas -  Obstructive Hypopneas 3.9  Central Apnea + Hypopnea (CAHI) -  Obstructive Apnea + Hypopnea (OAHI) 3.9   Respiratory  Event Durations   Apnea Hypopnea   NREM REM NREM REM  Average (seconds) - - 15.6 16.3  Maximum (seconds) - - 20.0 20.4    Oxygen Saturation Summary   Wake NREM REM TST TIB  Average SpO2 (%) 94.2% 93.4% 91.3% 93.3% 93.7%  Minimum SpO2 (%) 76.0% 88.0% 87.0% 87.0% 76.0%  Maximum SpO2 (%) 99.0% 99.0% 96.0% 99.0% 99.0%   Oxygen Saturation Distribution  Range (%) Time in range (min) Time in range (%)  90.0 - 100.0 385.0 95.6%  80.0 - 90.0 17.6 4.4%  70.0 - 80.0 0.1 0.0%  60.0 - 70.0 - -  50.0 - 60.0 - -  0.0 - 50.0 - -  Time Spent <=88% SpO2  Range (%) Time in range (min) Time in range (%)  0.0 - 88.0 3.0 0.8%      Count Index  Desaturations 23 6.9    Cardiac Summary   Wake NREM REM Sleep Total  Average Pulse Rate (BPM) 73.0 70.3 68.6 70.2 71.5  Minimum Pulse Rate (BPM) 59.0 36.0 64.0 36.0 36.0  Maximum Pulse Rate (BPM) 90.0 77.0 80.0 80.0 90.0   Pulse Rate Distribution:  Range (bpm) Time in range (min) Time in range (%)  0.0 - 40.0 0.3 0.1%  40.0 - 60.0 0.6 0.2%  60.0 - 80.0 363.9 90.4%  80.0 - 100.0 0.6 0.1%  100.0 - 120.0 - -  120.0 - 140.0 - -  140.0 - 200.0 - -   Supplemental O2 Summary  O2 Level Time (min) Wake (min) NREM (min) REM (min) Supine TST (min) Sleep Eff% OA# CA# MA# Hyp# (>=3%) AHI (>=3%) Hyp# (>=4%) AHI (>=%4) RERA RDI SpO2 <=88% (min) Min SpO2 Mean SpO2 Ar. Index  - 0.5 0.0 0.0 0.0  0.0 0.0%               O2: 2.00 424.5 223.5 186.5 14.5  0.0 47.3% - - - 13 3.9 1  0.3 4  5.1  1.2 87.0 93.3 37.9   Hypnograms                      Technologist Comments  THE 25-YEAR-OLD MALE PATIENT PRESENTED TO THE SLEEP DISORDER CENTER IN A WHEELCHAIR, ON 2LPM OF OXYGEN VIA NASAL CANNULA, ACCOMPANIED BY HIS DAUGHTER, FOR A NPSG DIAGNOSTIC STUDY WITH CHIEF COMPLAINTS OF NON-RESTORATIVE SLEEP AND SNORING. NO BEDTIME MEDICATIONS WERE SELF ADMINISTERED. THE DIAGNOSTIC STUDY WAS EXPLAINED WITH ALL QUESTIONS ANSWERED, THE LEAD PLACEMENT WAS  INITIATED, THEN THE STUDY WAS BEGUN. MODERATE TO LOUD AUDIBLE SNORING WAS NOTED THROUGHOUT THE STUDY. THE PATIENT WAS NOTED SITTING ON THE SIDE OF THE BED FREQUENTLY EATING ICE AND CANDY, COUGHING AND DRINKING FLUIDS THROUGHOUT THE STUDY. SUPPLEMENTAL OXYGEN AT 2 LPM VIA NASAL CANNULA WAS CONTINUED THROUGHOUT THE STUDY, PER O2 PROTOCOL. FREQUENT PLMs - PLMAs WERE NOTED. TWO RESTROOM VISITS WERE MADE. FREQUENT CARDIAC ARRHYTHMIAS WERE OBSERVED ON EPOCHS 9, 27, 70, 363, 405 (RUN OF PVCs), 635 AND THROUGHOUT THE STUDY. NO OBVIOUS PARASOMNIAs WERE OBSERVED.  THE PATIENT TOLERATED THE NPSG STUDY WELL.

## 2024-10-29 NOTE — Telephone Encounter (Signed)
 I called and spoke with patient, he is unable to be seen this afternoon, his daughter brings him to appointments and she is unable to do it this afternoon.  I cancelled the appointment for this afternoon.  I scheduled him for 11/13/24.  Nothing further needed.

## 2024-10-29 NOTE — Procedures (Signed)
  Indications for Polysomnography The patient is a 58 year old Male who is 5' 9 and weighs 205.0 lbs. His BMI equals 30.4.  A full night polysomnogram was performed to evaluate for -.  No MedicationsNo Data. Polysomnogram Data A full night polysomnogram recorded the standard physiologic parameters including EEG, EOG, EMG, EKG, nasal and oral airflow.  Respiratory parameters of chest and abdominal movements were recorded with Respiratory Inductance Plethysmography belts.   Oxygen saturation was recorded by pulse oximetry.  Sleep Architecture The total recording time of the polysomnogram was 424.6 minutes.  The total sleep time was 201.0 minutes.  The patient spent 10.2% of total sleep time in Stage N1, 79.6% in Stage N2, 3.0% in Stages N3, and 7.2% in REM.  Sleep latency was 14.1 minutes.   REM latency was 243.5 minutes.  Sleep Efficiency was 47.3%.  Wake after Sleep Onset time was 209.0 minutes.  Respiratory Events The polysomnogram revealed a presence of - obstructive, - central, and - mixed apneas resulting in an Apnea index of - events per hour.  There were 13 hypopneas (GreaterEqual to3% desaturation and/or arousal) resulting in an Apnea\Hypopnea Index (AHI  GreaterEqual to3% desaturation and/or arousal) of 3.9 events per hour.  There were 1 hypopnea (GreaterEqual to4% desaturation) resulting in an Apnea\Hypopnea Index (AHI GreaterEqual to4% desaturation) of 0.3 events per hour.  There were 4 Respiratory  Effort Related Arousals resulting in a RERA index of 1.2 events per hour. The Respiratory Disturbance Index is 5.1 events per hour.  The snore index was - events per hour.  Mean oxygen saturation was 93.7%.  The lowest oxygen saturation during sleep was 87.0%.  Time spent LessEqual to88% oxygen saturation was  minutes ().  Limb Activity There were 149 total limb movements recorded, of this total, 140 were classified as PLMs.  PLM index was 41.8 per hour and PLM associated with Arousals  index was 17.0 per hour.  Cardiac Summary The average pulse rate was 71.5 bpm.  The minimum pulse rate was 36.0 bpm while the maximum pulse rate was 90.0 bpm.  Cardiac rhythm was normal/abnormal.  Comments: Patient completed a diagnostic study.  Poor sleep efficiency with increased wake after sleep onset.  Study was performed on patient's usual oxygen supplementation at 2 L  Diagnosis: Negative study for significant sleep disordered breathing. AHI of 3.9 with O2 nadir of 87%. Study was performed on patient's usual oxygen supplementation of 2 L Moderate to severe snoring was noted Poor sleep efficiency, increased wake after sleep onset  Moderate periodic limb movement  Cardiac rhythm was sinus with PVCs  Recommendations: Continue 2 L of oxygen supplementation at night. Further evaluation and management of periodic limb movement. Avoid alcohol, sedatives and other CNS depressants that may worsen sleep apnea and disrupt normal sleep architecture. Sleep hygiene should be reviewed to assess factors that may improve sleep quality. Weight management and regular exercise should be initiated or continue  Close clinical follow-up for optimization of treatment  This study was personally reviewed and electronically signed by: Dr. Jennet Epley Accredited Board Certified in Sleep Medicine Date/Time:  10/29/24

## 2024-10-29 NOTE — Telephone Encounter (Signed)
 Call patient  Sleep study result  Date of study: 10/23/2024  Impression: Negative study for significant sleep disordered breathing with AHI of 3.9 Study was performed with patient's usual 2 L of oxygen Moderate to severe snoring Poor sleep efficiency Periodic limb movement  Recommendation: Continue 2 L of oxygen supplementation at night Further evaluation of periodic limb movement of sleep  Optimize sleep hygiene  Encourage weight loss measures  Follow-up as previously scheduled

## 2024-10-31 LAB — CUP PACEART REMOTE DEVICE CHECK
Date Time Interrogation Session: 20251125071600
Implantable Pulse Generator Implant Date: 20250605
Pulse Gen Serial Number: 148873

## 2024-11-03 ENCOUNTER — Ambulatory Visit: Payer: Self-pay | Admitting: Cardiology

## 2024-11-03 NOTE — Progress Notes (Signed)
 Remote Loop Recorder Transmission

## 2024-11-06 ENCOUNTER — Telehealth: Payer: Self-pay | Admitting: *Deleted

## 2024-11-06 NOTE — Telephone Encounter (Signed)
 Copied from CRM #[id number]. Topic: Clinical - Order For Equipment >> Nov 03, 2024  4:37 PM Ryan [name] wrote: Reason for CRM: Patient's niece is calling to request an extension of oxygen order. Patient has an ablation of the heart and had to reschedule. R/s for soonest available of 01/15 with NP. Would like to switch to a company that delivers to [place], [place].   I called and spoke with the pt and received verbal permission to speak with his daughter, Ryan [name]. Called and spoke with [facility]. She states needing sooner appt for o2 eval. Pt has been using o2 during the day some. I moved appt up to see Katie on 12/8 at 1 pm. Nothing further needed.

## 2024-11-10 ENCOUNTER — Encounter: Payer: Self-pay | Admitting: Nurse Practitioner

## 2024-11-10 ENCOUNTER — Ambulatory Visit: Admitting: Nurse Practitioner

## 2024-11-10 VITALS — BP 146/76 | HR 94 | Ht 69.0 in | Wt 209.4 lb

## 2024-11-10 DIAGNOSIS — F17210 Nicotine dependence, cigarettes, uncomplicated: Secondary | ICD-10-CM

## 2024-11-10 DIAGNOSIS — J9621 Acute and chronic respiratory failure with hypoxia: Secondary | ICD-10-CM

## 2024-11-10 DIAGNOSIS — J9611 Chronic respiratory failure with hypoxia: Secondary | ICD-10-CM

## 2024-11-10 DIAGNOSIS — I4892 Unspecified atrial flutter: Secondary | ICD-10-CM | POA: Diagnosis not present

## 2024-11-10 DIAGNOSIS — J432 Centrilobular emphysema: Secondary | ICD-10-CM | POA: Diagnosis not present

## 2024-11-10 DIAGNOSIS — I5020 Unspecified systolic (congestive) heart failure: Secondary | ICD-10-CM

## 2024-11-10 NOTE — Progress Notes (Signed)
 @Patient  ID: Ryan [name], male    DOB: 1966/01/22, 58 y.o.   MRN: [id number]  Chief Complaint  Patient presents with   Follow-up    O2 eval    Referring provider: [name], MD  HPI: 58 year old male, active smoker followed for emphysema and chronic respiratory failure. He is a patient of Dr. Cathye and last seen in office 07/31/2024. Past medical history significant for STEMI, PVD, PAF, HFrEF, cardiomyopathy, CAD s/p CABG, DM, CKD, ventricular apical thrombus.  TEST/EVENTS:  07/17/2024 CXR: cardiomegaly with small b/l effusions, similar to cardiac CT in May 10/23/2024 PSG: negative sleep study; AHI 3.9, SpO2 low 87% >> continue 2 lpm supplemental O2  07/31/2024: OV with Dr. Neda. Sleep disordered breathing, insomnia. Difficulty falling asleep. Active smoker. Recent hospitalizations for hypoxemic respiratory failure and decompensated HF. 4-5 night awakenings. Activity limitation about 2 blocks or less. Recently started on supplemental oxygen in the hospital. On diuretics, off Entresto , off SGLT for CHF. Trial rozerem  for sleep. Schedule for in lab study. Emphysema on CT chest. Schedule PFT, consider pulmonary rehab. Stiolto ordered. Smoking cessation advised.   11/10/2024: Today - follow up Discussed the use of AI scribe software for clinical note transcription with the patient, who gave verbal consent to proceed.  History of Present Illness Ryan [name] is a 58 year old male with chronic respiratory failure and emphysema who presents for follow up.   He has been on oxygen for a few months now. He's been using it consistently. It does dry out his nose so he is wanting a humidifier. He feels better with the oxygen. Currently using 2lpm but sometimes still has oxygen levels in the 70's on this when he checks at home. He's wanting to switch DME companies and is also needing something more portable.   He is scheduled for an ablation procedure to address his atrial fibrillation  Wednesday, 12/10. He has been feeling a little more swollen recently, especially in his mid section. His legs don't feel that swollen. He denies orthopnea, PND, palpitations, CP. He is currently taking diuretics, one in the morning and one at night. He was told he should take an extra pill if he's having swelling or weight gain, but he hasn't done this the last few days.   In terms of respiratory symptoms, he has a cough with minimal sputum production, which is chronic. He reports that his breathing has been about the same as it has been over the last few months. No increased shortness of breath. His sleep study did not show significant sleep apnea, and he continues to use oxygen therapy at night; was well managed on 2 lpm at night. No hemoptysis, weight changes, anorexia, chest congestion, wheezing.     Allergies  Allergen Reactions   Metformin Anaphylaxis    Water blisters between fingers   Hydrocodone  Hives and Nausea And Vomiting    Immunization History  Administered Date(s) Administered   Influenza Split 12/13/2011   Pneumococcal Polysaccharide-23 12/14/2011, 05/11/2020    Past Medical History:  Diagnosis Date   Acute myocardial infarction of other anterior wall, subsequent episode of care 12/12/2011   Anal fissure    Arthritis    back   Atherosclerosis of native artery of both lower extremities with intermittent claudication 04/01/2018   Bruises easily    d/t being on Effient    CAD (coronary artery disease)    A. Acute Ant STEMI 12/12/2011    Cardiomyopathy secondary 12/26/2011   Chronic systolic CHF (congestive heart  failure) (HCC)    ischemic CM // Echo 6/21: EF 30-35  //  Echocardiogram 10/21: LV apical thrombus resolved, EF 50-55, ant and apical, ant-lat HK, trivial MR, trivial AI   Chronic total occlusion of artery of the extremities 02/26/2012   Claudication 12/26/2011   Coronary atherosclerosis of native coronary artery    a. ant STEMI with cardiac arrest 2013 s/p DES to  mLAD. b. [place]  10/2016 s/p PTCA to mLAD.   Diabetes mellitus without complication (HCC)    Essential hypertension 10/23/2016   GERD (gastroesophageal reflux disease)    takes tums   History of blood transfusion    no abnormal reaction noted   History of kidney stones    Hypertension    Ischemic cardiomyopathy    a. EF 35% in 02/2012 at time of acute MI, improved to normal on subsequent imaging.   MI (myocardial infarction) (HCC)    AMI 1/13 - complicated by VT/Tosades   Mixed hyperlipidemia    Numbness and tingling of right arm 03/31/2013   Occlusion and stenosis of carotid artery without mention of cerebral infarction 08/26/2012   PAD (peripheral artery disease)    followed by Vascular - aortobifem bypass 01/2013 & left carotid-subclavian artery bypass 04/2013   Peripheral vascular disease, unspecified 12/16/2012   PVD (peripheral vascular disease) 03/31/2013   Rectal polyp    Subclavian steal syndrome 08/11/2013   Uncontrolled diabetes mellitus 10/23/2016   Wears partial dentures    upper    Tobacco History: Social History   Tobacco Use  Smoking Status Some Days   Current packs/day: 0.25   Average packs/day: 0.3 packs/day for 25.0 years (6.3 ttl pk-yrs)   Types: Cigarettes  Smokeless Tobacco Never  Tobacco Comments   About 1 a day 11/10/2024 KRD   Ready to quit: Not Answered Counseling given: Not Answered Tobacco comments: About 1 a day 11/10/2024 KRD   Outpatient Medications Prior to Visit  Medication Sig Dispense Refill   albuterol  (VENTOLIN  HFA) 108 (90 Base) MCG/ACT inhaler Inhale 1-2 puffs into the lungs every 6 (six) hours as needed for wheezing or shortness of breath.     amiodarone  (PACERONE ) 200 MG tablet TAKE 1 TABLET BY MOUTH EVERY DAY 90 tablet 1   cetirizine (ZYRTEC) 10 MG tablet Take 10 mg by mouth daily as needed for allergies.     clopidogrel  (PLAVIX ) 75 MG tablet TAKE 1 TABLET BY MOUTH EVERY DAY 15 tablet 0   diazepam  (VALIUM ) 10 MG tablet Take 10 mg by mouth  at bedtime.     digoxin  (LANOXIN ) 0.125 MG tablet Take 125 mcg by mouth daily.     ELIQUIS  5 MG TABS tablet TAKE 1 TABLET BY MOUTH 2 TIMES A DAY 180 tablet 1   ezetimibe  (ZETIA ) 10 MG tablet TAKE 1 TABLET BY MOUTH EACH DAY 90 tablet 5   gabapentin  (NEURONTIN ) 300 MG capsule Take 300 mg by mouth 2 (two) times daily.     icosapent  Ethyl (VASCEPA ) 1 g capsule TAKE 2 CAPSULES BY MOUTH TWICE DAILY 360 capsule 3   insulin  glargine (LANTUS ) 100 UNIT/ML Solostar Pen Inject 50 Units into the skin 2 (two) times daily.     insulin  lispro (HUMALOG ) 100 UNIT/ML injection Inject 5-15 Units into the skin 3 (three) times daily as needed (blood sugar over 150).     methocarbamol (ROBAXIN) 500 MG tablet Take 500 mg by mouth every 6 (six) hours as needed for muscle spasms.     metolazone  (ZAROXOLYN ) 5 MG tablet  Take 1 tablet (5 mg total) by mouth as directed. TAKE 1 TABLET 5MG  TODAY, AND TAKE 1 TABLET 5MG  TOMORROW, TAKE AN EXTRA OF POTASSIUM WITH METOLAZONE  DOSAGE 5 tablet 0   nitroGLYCERIN  (NITROSTAT ) 0.4 MG SL tablet Place 0.4 mg under the tongue every 5 (five) minutes as needed for chest pain.     oxyCODONE -acetaminophen  (PERCOCET) 10-325 MG tablet Take 1 tablet by mouth 4 (four) times daily.     OXYGEN Inhale 2 L into the lungs as needed (excessive movement).     pantoprazole  (PROTONIX ) 40 MG tablet Take 40 mg by mouth daily.     potassium chloride  SA (KLOR-CON  M) 20 MEQ tablet TAKE 1 TABLET BY MOUTH 2 TIMES A DAY 180 tablet 3   ramelteon  (ROZEREM ) 8 MG tablet Take 1 tablet (8 mg total) by mouth at bedtime. 30 tablet 2   REPATHA  SURECLICK 140 MG/ML SOAJ INJECT 140 MG INTO THE SKIN EVERY 14 DAYS AS DIRECTED 6 mL 3   spironolactone  (ALDACTONE ) 25 MG tablet Take 1 tablet (25 mg total) by mouth every evening. 30 tablet 11   Tiotropium Bromide-Olodaterol (STIOLTO RESPIMAT ) 2.5-2.5 MCG/ACT AERS Inhale 2 puffs into the lungs daily. 4 g 3   torsemide  (DEMADEX ) 20 MG tablet Take 2 tablets (40 mg total) by mouth 2  (two) times daily. 120 tablet 3   traZODone  (DESYREL ) 100 MG tablet Take 100 mg by mouth at bedtime.     TRINTELLIX  20 MG TABS tablet Take 20 mg by mouth daily in the afternoon.     Vitamin D, Ergocalciferol, (DRISDOL) 1.25 MG (50000 UNIT) CAPS capsule Take 50,000 Units by mouth every Saturday.     No facility-administered medications prior to visit.     Review of Systems: as above      Physical Exam:  BP (!) 146/76   Pulse 94   Ht 5' 9 (1.753 m)   Wt 209 lb 6.4 oz (95 kg)   SpO2 91% Comment: On 4L while ambulating  BMI 30.92 kg/m   GEN: Pleasant, interactive, chronically-ill appearing; obese; in no acute distress HEENT:  Normocephalic and atraumatic. PERRLA. Sclera white. Nasal turbinates pink, moist and patent bilaterally. No rhinorrhea present. Oropharynx pink and moist, without exudate or edema. No lesions, ulcerations, or postnasal drip.  NECK:  Supple w/ fair ROM. No JVD. No lymphadenopathy.   CV: Irregular rhythm, rate controlled, no m/r/g, no peripheral edema. Pulses intact, +2 bilaterally. No cyanosis, pallor or clubbing. PULMONARY:  Unlabored, regular breathing. Diminished bilaterally A&P w/o wheezes/rales/rhonchi. No accessory muscle use.  GI: BS present and normoactive. Protuberant abdomen. Soft, non-tender to palpation.  MSK: No erythema, warmth or tenderness. Cap refil <2 sec all extrem. No deformities or joint swelling noted.  Neuro: A/Ox3. No focal deficits noted.   Skin: Warm, no lesions or rashe Psych: Normal affect and behavior. Judgement and thought content appropriate.     Lab Results:  CBC    Component Value Date/Time   WBC 6.7 10/24/2024 1147   WBC 7.8 07/29/2024 0856   RBC 4.53 10/24/2024 1147   RBC 4.28 07/29/2024 0856   HGB 10.2 (L) 10/24/2024 1147   HCT 36.8 (L) 10/24/2024 1147   PLT 225 10/24/2024 1147   MCV 81 10/24/2024 1147   MCH 22.5 (L) 10/24/2024 1147   MCH 23.4 (L) 07/29/2024 0856   MCHC 27.7 (L) 10/24/2024 1147   MCHC 29.6 (L)  07/29/2024 0856   RDW 18.3 (H) 10/24/2024 1147   LYMPHSABS 2.0 03/29/2022 1746  MONOABS 1.0 03/29/2022 1746   EOSABS 0.2 03/29/2022 1746   BASOSABS 0.1 03/29/2022 1746    BMET    Component Value Date/Time   NA 133 (L) 10/24/2024 1147   K 3.6 10/24/2024 1147   CL 89 (L) 10/24/2024 1147   CO2 26 10/24/2024 1147   GLUCOSE 357 (H) 10/24/2024 1147   GLUCOSE 209 (H) 07/29/2024 0856   BUN 14 10/24/2024 1147   CREATININE 1.39 (H) 10/24/2024 1147   CREATININE 0.98 10/11/2016 0941   CALCIUM  8.6 (L) 10/24/2024 1147   GFRNONAA 56 (L) 07/29/2024 0856   GFRAA 87 06/18/2020 0945    BNP    Component Value Date/Time   BNP 1,679.4 (H) 07/29/2024 0856     Imaging:  CUP PACEART REMOTE DEVICE CHECK Result Date: 10/31/2024 ILR summary report received. Battery status OK. Normal device function. No new symptom, tachy, brady, or pause episodes. 5 new AF episodes, longest lasting 4 minutes. Known AFL, on Eliquis  per Epic. Monthly summary reports and ROV/PRN - CS, CVRS   Administration History     None          Latest Ref Rng & Units 05/05/2020    6:15 AM  PFT Results  FVC-Pre L 3.37   FVC-Predicted Pre % 70   Pre FEV1/FVC % % 74   FEV1-Pre L 2.50   FEV1-Predicted Pre % 67     No results found for: NITRICOXIDE   Vitals:   11/10/24 1326 11/10/24 1345 11/10/24 1346 11/10/24 1347  BP:      Pulse: 89 83 91 94  Height:      Weight:      SpO2: 94% Comment: On 3L while ambulating 94% Comment: On room air at rest (!) 87% Comment: On room air while ambulating 91% Comment: On 4L while ambulating  BMI (Calculated):          Assessment & Plan:   Chronic respiratory failure with hypoxia (HCC) Chronic respiratory failure likely multifactorial in nature related to CHF, cardiomyopathy, PAF, emphysema. Walk test today with desaturations on room air; resolved with 3 lpm supplemental O2 continuous O2. Able to maintain saturations 4 lpm POC. Goal >88-90%. Orders placed to transfer  DME companies and add humidity to O2.   Patient Instructions  Continue Albuterol  inhaler 2 puffs every 6 hours as needed for shortness of breath or wheezing. Notify if symptoms persist despite rescue inhaler/neb use.  Continue Stiolto 2 puffs daily  Continue your torsemide  as prescribed. Take an extra dose as directed by your heart doctor for increased swelling or weight gain of 2-3 lb overnight, or 5 lb in a week  Continue supplemental oxygen 2 lpm at night and 3 lpm continuous or 4 lpm POC during the day. Goal >88-90%  I have sent orders to change your medical supply company to [employer] and provide humidity   Follow up in 4 months with Dr. Neda after PFT. If symptoms do not improve or worsen, please contact office for sooner follow up or seek emergency care.    Centrilobular emphysema (HCC) Significant emphysema on imaging. No formal PFT. Ordered today. Stable on current regimen. No recent exacerbations. Smoking cessation strongly advised. Action plan in place.   HFrEF (heart failure with reduced ejection fraction) (HCC) HFrEF, cardiomyopathy and PAF. Awaiting ablation for PAF. No significant BLE edema on exam. Does have a protuberant abdomen. Advised to take extra dose of torsemide , as prescribed by cardiology given swelling/weight gain. Cautioned to monitor BP. Encouraged to contact cardiology  to notify if symptoms fail to resolve.   Paroxysmal atrial flutter (HCC) Awaiting ablation 12/10. Follow up with cardiology/EP as scheduled   Advised if symptoms do not improve or worsen, to please contact office for sooner follow up or seek emergency care.   I spent 35 minutes of dedicated to the care of this patient on the date of this encounter to include pre-visit review of records, face-to-face time with the patient discussing conditions above, post visit ordering of testing, clinical documentation with the electronic health record, making appropriate referrals as documented, and  communicating necessary findings to members of the patients care team.  Comer LULLA Rouleau, NP 11/10/2024  Pt aware and understands NP's role.

## 2024-11-10 NOTE — Assessment & Plan Note (Addendum)
 Significant emphysema on imaging. No formal PFT. Ordered today. Stable on current regimen. No recent exacerbations. Smoking cessation strongly advised. Action plan in place.

## 2024-11-10 NOTE — Patient Instructions (Addendum)
 Continue Albuterol  inhaler 2 puffs every 6 hours as needed for shortness of breath or wheezing. Notify if symptoms persist despite rescue inhaler/neb use.  Continue Stiolto 2 puffs daily  Continue your torsemide  as prescribed. Take an extra dose as directed by your heart doctor for increased swelling or weight gain of 2-3 lb overnight, or 5 lb in a week  Continue supplemental oxygen 2 lpm at night and 3 lpm continuous or 4 lpm POC during the day. Goal >88-90%  I have sent orders to change your medical supply company to [employer] and provide humidity   Follow up in 4 months with Dr. Neda after PFT. If symptoms do not improve or worsen, please contact office for sooner follow up or seek emergency care.

## 2024-11-10 NOTE — Assessment & Plan Note (Addendum)
 Chronic respiratory failure likely multifactorial in nature related to CHF, cardiomyopathy, PAF, emphysema. Walk test today with desaturations on room air; resolved with 3 lpm supplemental O2 continuous O2. Able to maintain saturations 4 lpm POC. Goal >88-90%. Orders placed to transfer DME companies and add humidity to O2.   Patient Instructions  Continue Albuterol  inhaler 2 puffs every 6 hours as needed for shortness of breath or wheezing. Notify if symptoms persist despite rescue inhaler/neb use.  Continue Stiolto 2 puffs daily  Continue your torsemide  as prescribed. Take an extra dose as directed by your heart doctor for increased swelling or weight gain of 2-3 lb overnight, or 5 lb in a week  Continue supplemental oxygen 2 lpm at night and 3 lpm continuous or 4 lpm POC during the day. Goal >88-90%  I have sent orders to change your medical supply company to [employer] and provide humidity   Follow up in 4 months with Dr. Neda after PFT. If symptoms do not improve or worsen, please contact office for sooner follow up or seek emergency care.

## 2024-11-10 NOTE — Assessment & Plan Note (Signed)
 Awaiting ablation 12/10. Follow up with cardiology/EP as scheduled

## 2024-11-10 NOTE — Assessment & Plan Note (Signed)
 HFrEF, cardiomyopathy and PAF. Awaiting ablation for PAF. No significant BLE edema on exam. Does have a protuberant abdomen. Advised to take extra dose of torsemide , as prescribed by cardiology given swelling/weight gain. Cautioned to monitor BP. Encouraged to contact cardiology to notify if symptoms fail to resolve.

## 2024-11-11 ENCOUNTER — Encounter

## 2024-11-11 NOTE — Pre-Procedure Instructions (Signed)
 Attempted to call patient regarding procedure.  Left voicemail on the  on the following items: Arrival time 0615 Nothing to eat or drink after midnight No meds AM of procedure Responsible person to drive you home and stay with you for 24 hrs  Have you missed any doses of anti-coagulant Eliquis - should be taken it twice a day. If you have missed any doses please let us  know.  Don't take dose morning of procedure.

## 2024-11-12 ENCOUNTER — Encounter (HOSPITAL_COMMUNITY): Admission: RE | Disposition: A | Payer: Self-pay | Source: Home / Self Care | Attending: Cardiology

## 2024-11-12 ENCOUNTER — Other Ambulatory Visit (HOSPITAL_COMMUNITY): Payer: Self-pay

## 2024-11-12 ENCOUNTER — Encounter: Payer: Self-pay | Admitting: Cardiology

## 2024-11-12 ENCOUNTER — Encounter (HOSPITAL_COMMUNITY): Payer: Self-pay | Admitting: Cardiology

## 2024-11-12 ENCOUNTER — Ambulatory Visit (HOSPITAL_COMMUNITY): Admitting: Anesthesiology

## 2024-11-12 ENCOUNTER — Ambulatory Visit (HOSPITAL_COMMUNITY)

## 2024-11-12 ENCOUNTER — Other Ambulatory Visit: Payer: Self-pay

## 2024-11-12 ENCOUNTER — Ambulatory Visit (HOSPITAL_COMMUNITY)
Admission: RE | Admit: 2024-11-12 | Discharge: 2024-11-13 | Disposition: A | Attending: Cardiology | Admitting: Cardiology

## 2024-11-12 DIAGNOSIS — I4891 Unspecified atrial fibrillation: Secondary | ICD-10-CM | POA: Diagnosis present

## 2024-11-12 DIAGNOSIS — I2510 Atherosclerotic heart disease of native coronary artery without angina pectoris: Secondary | ICD-10-CM | POA: Diagnosis not present

## 2024-11-12 DIAGNOSIS — E1151 Type 2 diabetes mellitus with diabetic peripheral angiopathy without gangrene: Secondary | ICD-10-CM | POA: Diagnosis not present

## 2024-11-12 DIAGNOSIS — Z7902 Long term (current) use of antithrombotics/antiplatelets: Secondary | ICD-10-CM | POA: Insufficient documentation

## 2024-11-12 DIAGNOSIS — I5022 Chronic systolic (congestive) heart failure: Secondary | ICD-10-CM | POA: Diagnosis not present

## 2024-11-12 DIAGNOSIS — Z720 Tobacco use: Secondary | ICD-10-CM | POA: Diagnosis not present

## 2024-11-12 DIAGNOSIS — K219 Gastro-esophageal reflux disease without esophagitis: Secondary | ICD-10-CM | POA: Diagnosis not present

## 2024-11-12 DIAGNOSIS — I480 Paroxysmal atrial fibrillation: Secondary | ICD-10-CM

## 2024-11-12 DIAGNOSIS — I110 Hypertensive heart disease with heart failure: Secondary | ICD-10-CM | POA: Insufficient documentation

## 2024-11-12 DIAGNOSIS — I483 Typical atrial flutter: Secondary | ICD-10-CM | POA: Insufficient documentation

## 2024-11-12 DIAGNOSIS — J449 Chronic obstructive pulmonary disease, unspecified: Secondary | ICD-10-CM | POA: Insufficient documentation

## 2024-11-12 DIAGNOSIS — Z79899 Other long term (current) drug therapy: Secondary | ICD-10-CM | POA: Diagnosis not present

## 2024-11-12 DIAGNOSIS — Z7901 Long term (current) use of anticoagulants: Secondary | ICD-10-CM | POA: Insufficient documentation

## 2024-11-12 DIAGNOSIS — E785 Hyperlipidemia, unspecified: Secondary | ICD-10-CM | POA: Diagnosis not present

## 2024-11-12 DIAGNOSIS — Z955 Presence of coronary angioplasty implant and graft: Secondary | ICD-10-CM | POA: Diagnosis not present

## 2024-11-12 DIAGNOSIS — D6869 Other thrombophilia: Secondary | ICD-10-CM | POA: Diagnosis not present

## 2024-11-12 DIAGNOSIS — I252 Old myocardial infarction: Secondary | ICD-10-CM | POA: Diagnosis not present

## 2024-11-12 DIAGNOSIS — Z794 Long term (current) use of insulin: Secondary | ICD-10-CM | POA: Diagnosis not present

## 2024-11-12 DIAGNOSIS — Z95818 Presence of other cardiac implants and grafts: Secondary | ICD-10-CM | POA: Insufficient documentation

## 2024-11-12 DIAGNOSIS — Z951 Presence of aortocoronary bypass graft: Secondary | ICD-10-CM | POA: Diagnosis not present

## 2024-11-12 DIAGNOSIS — R296 Repeated falls: Secondary | ICD-10-CM

## 2024-11-12 HISTORY — PX: ATRIAL FIBRILLATION ABLATION: EP1191

## 2024-11-12 LAB — BASIC METABOLIC PANEL WITH GFR
Anion gap: 9 (ref 5–15)
BUN: 25 mg/dL — ABNORMAL HIGH (ref 6–20)
CO2: 28 mmol/L (ref 22–32)
Calcium: 7.8 mg/dL — ABNORMAL LOW (ref 8.9–10.3)
Chloride: 92 mmol/L — ABNORMAL LOW (ref 98–111)
Creatinine, Ser: 1.5 mg/dL — ABNORMAL HIGH (ref 0.61–1.24)
GFR, Estimated: 54 mL/min — ABNORMAL LOW (ref >60)
Glucose, Bld: 344 mg/dL — ABNORMAL HIGH (ref 70–99)
Potassium: 4.4 mmol/L (ref 3.5–5.1)
Sodium: 129 mmol/L — ABNORMAL LOW (ref 135–145)

## 2024-11-12 LAB — GLUCOSE, CAPILLARY
Glucose-Capillary: 200 mg/dL — ABNORMAL HIGH (ref 70–99)
Glucose-Capillary: 243 mg/dL — ABNORMAL HIGH (ref 70–99)
Glucose-Capillary: 271 mg/dL — ABNORMAL HIGH (ref 70–99)
Glucose-Capillary: 360 mg/dL — ABNORMAL HIGH (ref 70–99)
Glucose-Capillary: 274 mg/dL — ABNORMAL HIGH (ref 70–99)
Glucose-Capillary: 351 mg/dL — ABNORMAL HIGH (ref 70–99)
Glucose-Capillary: 387 mg/dL — ABNORMAL HIGH (ref 70–99)

## 2024-11-12 SURGERY — ATRIAL FIBRILLATION ABLATION
Anesthesia: General

## 2024-11-12 MED ORDER — OXYCODONE-ACETAMINOPHEN 5-325 MG PO TABS
1.0000 | ORAL_TABLET | Freq: Four times a day (QID) | ORAL | Status: DC | PRN
  Administered 2024-11-12 – 2024-11-13 (×3): 1 via ORAL
  Filled 2024-11-12 (×3): qty 1

## 2024-11-12 MED ORDER — PROPOFOL 10 MG/ML IV BOLUS
INTRAVENOUS | Status: DC | PRN
  Administered 2024-11-12: 200 mg via INTRAVENOUS

## 2024-11-12 MED ORDER — HEPARIN (PORCINE) IN NACL 1000-0.9 UT/500ML-% IV SOLN
INTRAVENOUS | Status: DC | PRN
  Administered 2024-11-12 (×2): 500 mL

## 2024-11-12 MED ORDER — GABAPENTIN 300 MG PO CAPS
300.0000 mg | ORAL_CAPSULE | Freq: Two times a day (BID) | ORAL | Status: DC
  Administered 2024-11-12 – 2024-11-13 (×2): 300 mg via ORAL
  Filled 2024-11-12 (×2): qty 1

## 2024-11-12 MED ORDER — ACETAMINOPHEN 325 MG PO TABS: 650.0000 mg | ORAL_TABLET | ORAL | Status: DC | PRN

## 2024-11-12 MED ORDER — SODIUM CHLORIDE 0.9 % IV SOLN: INTRAVENOUS | Status: DC

## 2024-11-12 MED ORDER — FENTANYL CITRATE (PF) 100 MCG/2ML IJ SOLN
INTRAMUSCULAR | Status: DC | PRN
  Administered 2024-11-12: 100 ug via INTRAVENOUS

## 2024-11-12 MED ORDER — PROTAMINE SULFATE 10 MG/ML IV SOLN
INTRAVENOUS | Status: DC | PRN
  Administered 2024-11-12 (×2): 20 mg via INTRAVENOUS

## 2024-11-12 MED ORDER — LIDOCAINE 2% (20 MG/ML) 5 ML SYRINGE
INTRAMUSCULAR | Status: DC | PRN
  Administered 2024-11-12: 60 mg via INTRAVENOUS

## 2024-11-12 MED ORDER — DEXAMETHASONE SOD PHOSPHATE PF 10 MG/ML IJ SOLN
INTRAMUSCULAR | Status: DC | PRN
  Administered 2024-11-12: 5 mg via INTRAVENOUS

## 2024-11-12 MED ORDER — APIXABAN 5 MG PO TABS
5.0000 mg | ORAL_TABLET | Freq: Once | ORAL | Status: AC
  Administered 2024-11-12: 5 mg via ORAL
  Filled 2024-11-12: qty 1

## 2024-11-12 MED ORDER — POTASSIUM CHLORIDE CRYS ER 20 MEQ PO TBCR
20.0000 meq | EXTENDED_RELEASE_TABLET | Freq: Two times a day (BID) | ORAL | Status: DC
  Administered 2024-11-12 – 2024-11-13 (×2): 20 meq via ORAL
  Filled 2024-11-12 (×2): qty 1

## 2024-11-12 MED ORDER — HEPARIN SODIUM (PORCINE) 1000 UNIT/ML IJ SOLN
INTRAMUSCULAR | Status: DC | PRN
  Administered 2024-11-12: 15000 [IU] via INTRAVENOUS
  Administered 2024-11-12: 5000 [IU] via INTRAVENOUS

## 2024-11-12 MED ORDER — METHOCARBAMOL 500 MG PO TABS
500.0000 mg | ORAL_TABLET | Freq: Four times a day (QID) | ORAL | Status: DC | PRN
  Administered 2024-11-13: 500 mg via ORAL
  Filled 2024-11-12: qty 1

## 2024-11-12 MED ORDER — MIDAZOLAM HCL (PF) 2 MG/2ML IJ SOLN
INTRAMUSCULAR | Status: DC | PRN
  Administered 2024-11-12 (×2): 1 mg via INTRAVENOUS

## 2024-11-12 MED ORDER — ICOSAPENT ETHYL 1 G PO CAPS
2.0000 g | ORAL_CAPSULE | Freq: Two times a day (BID) | ORAL | Status: DC
  Administered 2024-11-12 – 2024-11-13 (×2): 2 g via ORAL
  Filled 2024-11-12 (×4): qty 2

## 2024-11-12 MED ORDER — APIXABAN 5 MG PO TABS
5.0000 mg | ORAL_TABLET | Freq: Two times a day (BID) | ORAL | Status: DC
  Administered 2024-11-12 – 2024-11-13 (×2): 5 mg via ORAL
  Filled 2024-11-12 (×2): qty 1

## 2024-11-12 MED ORDER — MIDAZOLAM HCL 2 MG/2ML IJ SOLN
INTRAMUSCULAR | Status: AC
  Filled 2024-11-12: qty 2

## 2024-11-12 MED ORDER — IPRATROPIUM-ALBUTEROL 0.5-2.5 (3) MG/3ML IN SOLN: 3.0000 mL | Freq: Once | RESPIRATORY_TRACT | Status: DC

## 2024-11-12 MED ORDER — INSULIN ASPART 100 UNIT/ML IJ SOLN
0.0000 [IU] | INTRAMUSCULAR | Status: DC
  Administered 2024-11-12 (×2): 15 [IU] via SUBCUTANEOUS
  Administered 2024-11-12: 8 [IU] via SUBCUTANEOUS
  Administered 2024-11-12: 15 [IU] via SUBCUTANEOUS
  Administered 2024-11-13 (×2): 8 [IU] via SUBCUTANEOUS
  Administered 2024-11-13: 11 [IU] via SUBCUTANEOUS
  Filled 2024-11-12: qty 15
  Filled 2024-11-12: qty 8
  Filled 2024-11-12 (×2): qty 15
  Filled 2024-11-12 (×2): qty 8
  Filled 2024-11-12: qty 11

## 2024-11-12 MED ORDER — OXYCODONE HCL 5 MG PO TABS
5.0000 mg | ORAL_TABLET | Freq: Four times a day (QID) | ORAL | Status: DC | PRN
  Administered 2024-11-12 – 2024-11-13 (×2): 5 mg via ORAL
  Filled 2024-11-12 (×2): qty 1

## 2024-11-12 MED ORDER — IPRATROPIUM-ALBUTEROL 0.5-2.5 (3) MG/3ML IN SOLN
RESPIRATORY_TRACT | Status: AC
  Filled 2024-11-12: qty 3

## 2024-11-12 MED ORDER — HEPARIN SODIUM (PORCINE) 1000 UNIT/ML IJ SOLN
INTRAMUSCULAR | Status: AC
  Filled 2024-11-12: qty 20

## 2024-11-12 MED ORDER — EZETIMIBE 10 MG PO TABS
10.0000 mg | ORAL_TABLET | Freq: Every day | ORAL | Status: DC
  Administered 2024-11-13: 10 mg via ORAL
  Filled 2024-11-12: qty 1

## 2024-11-12 MED ORDER — SPIRONOLACTONE 25 MG PO TABS: 25.0000 mg | ORAL_TABLET | Freq: Every evening | ORAL | Status: DC

## 2024-11-12 MED ORDER — SODIUM CHLORIDE 0.9% FLUSH
3.0000 mL | Freq: Two times a day (BID) | INTRAVENOUS | Status: DC
  Administered 2024-11-12 – 2024-11-13 (×3): 3 mL via INTRAVENOUS

## 2024-11-12 MED ORDER — SUGAMMADEX SODIUM 200 MG/2ML IV SOLN
INTRAVENOUS | Status: DC | PRN
  Administered 2024-11-12: 200 mg via INTRAVENOUS

## 2024-11-12 MED ORDER — SODIUM CHLORIDE 0.9 % IV SOLN: 250.0000 mL | INTRAVENOUS | Status: DC | PRN

## 2024-11-12 MED ORDER — ROCURONIUM BROMIDE 10 MG/ML (PF) SYRINGE
PREFILLED_SYRINGE | INTRAVENOUS | Status: DC | PRN
  Administered 2024-11-12: 30 mg via INTRAVENOUS
  Administered 2024-11-12: 40 mg via INTRAVENOUS

## 2024-11-12 MED ORDER — PANTOPRAZOLE SODIUM 40 MG PO TBEC
40.0000 mg | DELAYED_RELEASE_TABLET | Freq: Every day | ORAL | Status: DC
  Administered 2024-11-12 – 2024-11-13 (×2): 40 mg via ORAL
  Filled 2024-11-12 (×2): qty 1

## 2024-11-12 MED ORDER — INSULIN GLARGINE 100 UNIT/ML ~~LOC~~ SOLN
25.0000 [IU] | Freq: Two times a day (BID) | SUBCUTANEOUS | Status: DC
  Administered 2024-11-12 – 2024-11-13 (×2): 25 [IU] via SUBCUTANEOUS
  Filled 2024-11-12 (×3): qty 0.25

## 2024-11-12 MED ORDER — FUROSEMIDE 10 MG/ML IJ SOLN
80.0000 mg | Freq: Two times a day (BID) | INTRAMUSCULAR | Status: DC
  Administered 2024-11-12 – 2024-11-13 (×2): 80 mg via INTRAVENOUS
  Filled 2024-11-12 (×2): qty 8

## 2024-11-12 MED ORDER — PHENYLEPHRINE 80 MCG/ML (10ML) SYRINGE FOR IV PUSH (FOR BLOOD PRESSURE SUPPORT)
PREFILLED_SYRINGE | INTRAVENOUS | Status: DC | PRN
  Administered 2024-11-12: 80 ug via INTRAVENOUS

## 2024-11-12 MED ORDER — INSULIN GLARGINE-YFGN 100 UNIT/ML ~~LOC~~ SOLN: 25.0000 [IU] | Freq: Two times a day (BID) | SUBCUTANEOUS | Status: DC

## 2024-11-12 MED ORDER — ONDANSETRON HCL 4 MG/2ML IJ SOLN
INTRAMUSCULAR | Status: DC | PRN
  Administered 2024-11-12: 4 mg via INTRAVENOUS

## 2024-11-12 MED ORDER — UMECLIDINIUM BROMIDE 62.5 MCG/ACT IN AEPB
1.0000 | INHALATION_SPRAY | Freq: Every day | RESPIRATORY_TRACT | Status: DC
  Administered 2024-11-13: 1 via RESPIRATORY_TRACT
  Filled 2024-11-12: qty 7

## 2024-11-12 MED ORDER — DIGOXIN 125 MCG PO TABS
125.0000 ug | ORAL_TABLET | Freq: Every day | ORAL | Status: DC
  Administered 2024-11-13: 125 ug via ORAL
  Filled 2024-11-12: qty 1

## 2024-11-12 MED ORDER — ATROPINE SULFATE 1 MG/ML IV SOLN
INTRAVENOUS | Status: DC | PRN
  Administered 2024-11-12: 1 mg via INTRAVENOUS

## 2024-11-12 MED ORDER — OXYCODONE-ACETAMINOPHEN 10-325 MG PO TABS: 1.0000 | ORAL_TABLET | Freq: Four times a day (QID) | ORAL | Status: DC | PRN

## 2024-11-12 MED ORDER — SODIUM CHLORIDE 0.9% FLUSH: 3.0000 mL | INTRAVENOUS | Status: DC | PRN

## 2024-11-12 MED ORDER — NITROGLYCERIN 0.4 MG SL SUBL: 0.4000 mg | SUBLINGUAL_TABLET | SUBLINGUAL | Status: DC | PRN

## 2024-11-12 MED ORDER — FENTANYL CITRATE (PF) 100 MCG/2ML IJ SOLN
INTRAMUSCULAR | Status: AC
  Filled 2024-11-12: qty 2

## 2024-11-12 MED ORDER — CLOPIDOGREL BISULFATE 75 MG PO TABS
75.0000 mg | ORAL_TABLET | Freq: Every day | ORAL | Status: DC
  Administered 2024-11-13: 75 mg via ORAL
  Filled 2024-11-12: qty 1

## 2024-11-12 MED ORDER — ONDANSETRON HCL 4 MG/2ML IJ SOLN: 4.0000 mg | Freq: Four times a day (QID) | INTRAMUSCULAR | Status: DC | PRN

## 2024-11-12 MED ORDER — ALBUTEROL SULFATE (2.5 MG/3ML) 0.083% IN NEBU
INHALATION_SOLUTION | RESPIRATORY_TRACT | Status: AC
  Administered 2024-11-12: 2.5 mg
  Filled 2024-11-12: qty 3

## 2024-11-12 MED ORDER — FUROSEMIDE 10 MG/ML IJ SOLN: 80.0000 mg | Freq: Once | INTRAMUSCULAR | Status: DC

## 2024-11-12 MED ORDER — PHENYLEPHRINE HCL-NACL 20-0.9 MG/250ML-% IV SOLN
INTRAVENOUS | Status: DC | PRN
  Administered 2024-11-12: 20 ug/min via INTRAVENOUS

## 2024-11-12 MED ORDER — ARFORMOTEROL TARTRATE 15 MCG/2ML IN NEBU
15.0000 ug | INHALATION_SOLUTION | Freq: Two times a day (BID) | RESPIRATORY_TRACT | Status: DC
  Administered 2024-11-12 – 2024-11-13 (×2): 15 ug via RESPIRATORY_TRACT
  Filled 2024-11-12: qty 2

## 2024-11-12 MED ORDER — RAMELTEON 8 MG PO TABS
8.0000 mg | ORAL_TABLET | Freq: Every day | ORAL | Status: DC
  Administered 2024-11-12: 8 mg via ORAL
  Filled 2024-11-12 (×2): qty 1

## 2024-11-12 MED ORDER — IPRATROPIUM-ALBUTEROL 0.5-2.5 (3) MG/3ML IN SOLN
3.0000 mL | Freq: Four times a day (QID) | RESPIRATORY_TRACT | Status: DC
  Administered 2024-11-12 – 2024-11-13 (×3): 3 mL via RESPIRATORY_TRACT
  Filled 2024-11-12 (×3): qty 3

## 2024-11-12 SURGICAL SUPPLY — 18 items
CABLE FARASTAR GEN2 SNGL USE (CABLE) IMPLANT
CATH BI DIR 7FR CS F-J 12 PIN (CATHETERS) IMPLANT
CATH FARAWAVE 2.0 31 (CATHETERS) IMPLANT
CATH GE 8FR SOUNDSTAR (CATHETERS) IMPLANT
CATH OCTARAY 1.5 F (CATHETERS) IMPLANT
CLOSURE PERCLOSE PROSTYLE (Vascular Products) IMPLANT
COVER SWIFTLINK CONNECTOR (BAG) ×1 IMPLANT
DILATOR VESSEL 38 20CM 16FR (INTRODUCER) IMPLANT
GUIDEWIRE INQWIRE 1.5J.035X260 (WIRE) IMPLANT
KIT VERSACROSS CNCT FARADRIVE (KITS) IMPLANT
MAT PREVALON FULL STRYKER (MISCELLANEOUS) IMPLANT
PACK EP LF (CUSTOM PROCEDURE TRAY) ×1 IMPLANT
PAD DEFIB RADIO PHYSIO CONN (PAD) ×1 IMPLANT
PATCH CARTO3 (PAD) IMPLANT
SHEATH FARADRIVE STEERABLE (SHEATH) IMPLANT
SHEATH PINNACLE 8F 10CM (SHEATH) IMPLANT
SHEATH PINNACLE 9F 10CM (SHEATH) IMPLANT
SHEATH PROBE COVER 6X72 (BAG) IMPLANT

## 2024-11-12 NOTE — H&P (Signed)
 Electrophysiology Note:   Date:  11/12/24  ID:  Ryan [name], DOB 1966-01-13, MRN [id number]   Primary Cardiologist: [name], MD Electrophysiologist: Fonda Kitty, MD       History of Present Illness:   Ryan [name] is a 58 y.o. male with h/o CAD s/p STEMI 12/2011 with DES to LAD, multiple PCIs - 10/2016, 07/2017, and 05/2019, CABG in 05/2020, NSTEMI 03/2022 with DES, PAD s/p aorto-bifem bypass in 2014, L carotid to subclavian bypass in 04/2013, R fem to AK popliteal bypass and R fem endarterectomy, angioplasty to right CFA 06/2021 and 12/2022, L femoral endarterectomy 01/2023, HFrEF, HTN, DM, COPD, tobacco use who is being seen today for evaluation of his atrial flutter at the request of Dr. Zenaida.    Discussed the use of AI scribe software for clinical note transcription with the patient, who gave verbal consent to proceed.   History of Present Illness Ryan [name] is a 58 year old male with atrial flutter who presents with episodes of atrial fibrillation.   He has a history of atrial flutter and underwent CTI ablation on 05/05/24 with ILR implant. Post ablation, ILR has detected episodes of atrial fibrillation characterized by sensations of his heart 'pounding, racing, skipping', which catch him off guard and make him feel unwell.   In March, an echocardiogram revealed an ejection fraction of about 25% while in atrial flutter. Following the procedure, a repeat echocardiogram a month ago showed improvement, with his heart function increasing to 45%.   He is currently on amiodarone  to manage his condition. But would like to avoid this long-term due to lung disease.    Interval: Patient presents today for planned ablation. Reports feeling relatively well. No new or acute complaints.   Review of systems complete and found to be negative unless listed in HPI.    EP Information / Studies Reviewed:     EKG is not ordered today. EKG from 07/18/24 reviewed which showed SR with first  degree AV block and PVCs.        03/01/24: AFL    Echo 07/18/24:  1. Difficult acoustic windows Hypokinesis/ akinesis of the distal lateral  and apical walls.. Left ventricular ejection fraction, by estimation, is  45%. The left ventricle has mildly decreased function. The left ventricle  demonstrates global hypokinesis.   Left ventricular diastolic parameters are consistent with Grade II  diastolic dysfunction (pseudonormalization).   2. Right ventricular systolic function is low normal. The right  ventricular size is normal.   3. Left atrial size was mildly dilated.   4. Trivial mitral valve regurgitation.   5. AV is thickened, calcified with minimally restricted motion Mean  gradeint through the valve is 13 mm Hg. . The aortic valve is tricuspid.  Aortic valve regurgitation is not visualized. Mild aortic valve stenosis.   6. The inferior vena cava is normal in size with greater than 50%  respiratory variability, suggesting right atrial pressure of 3 mmHg.      Risk Assessment/Calculations:     CHA2DS2-VASc Score = 4   This indicates a 4.8% annual risk of stroke. The patient's score is based upon: CHF History: 1 HTN History: 1 Diabetes History: 1 Stroke History: 0 Vascular Disease History: 1 Age Score: 0 Gender Score: 0               Physical Exam:    Wt Readings from Last 3 Encounters:  11/10/24 95 kg  10/23/24 93 kg  08/18/24 86.9 kg  Temp Readings from Last 3 Encounters:  07/20/24 97.6 F (36.4 C) (Oral)  05/05/24 97.6 F (36.4 C) (Axillary)  03/31/24 98.2 F (36.8 C) (Temporal)   BP Readings from Last 3 Encounters:  11/10/24 (!) 146/76  08/18/24 (!) 112/56  07/31/24 122/75   Pulse Readings from Last 3 Encounters:  11/10/24 94  08/18/24 62  07/31/24 78    GEN: Well nourished, well developed in no acute distress NECK: No JVD CARDIAC: Normal rate, regular rhythm RESPIRATORY:  Clear to auscultation without rales, wheezing or rhonchi  ABDOMEN:  Soft, non-distended EXTREMITIES: Trace edema; No deformity    ASSESSMENT AND PLAN:     #. Paroxysmal atrial fibrillation: Symptomatic. Diagnosed on ILR post AFL ablation. H/o drop in LVEF while in AFL. For this reason we have prioritized a rhythm control strategy. Would like to avoid long term amiodarone  d/t lung disease.  #. Typical atrial flutter: S/p CTI ablation on 05/05/24. #. Secondary hypercoagulable state due to atrial flutter: CHA2DS2-VASc score of 4. #. High risk medication use: Antiarrhythmic drug therapy - amiodarone .  #. S/p loop recorder -Discussed treatment options today for AF including antiarrhythmic drug therapy and ablation. Discussed risks, recovery and likelihood of success with each treatment strategy. Risk, benefits, and alternatives to EP study and ablation for afib were discussed. These risks include but are not limited to stroke, bleeding, vascular damage, tamponade, perforation, damage to the esophagus, lungs, phrenic nerve and other structures, pulmonary vein stenosis, worsening renal function, coronary vasospasm and death.  Discussed potential need for repeat ablation procedures and antiarrhythmic drugs after an initial ablation. The patient understands these risk and wishes to proceed.  We will therefore proceed with catheter ablation today. -Continue amiodarone  200mg  once daily. Plan to stop in 1 month. -Continue Eliquis  5 mg twice daily with no interruption.   #.  Chronic combined systolic and diastolic heart failure: EF improved from 25-30% in AFL to 45% in NSR. -Continue home GDMT regimen of Entresto  24-26 mg twice daily, spironolactone  25 mg once daily, carvedilol  6.25 mg twice daily.     #.  History of LV thrombus: This occurred years ago in the setting of acute MI.  He has been on Eliquis  he states for at least 3 years.  No LV thrombus visualized on echo from 03/02/2024. -Eliquis  as above.   #.  CAD status post multiple PCI's and CABG: Currently denies chest  pain.  Troponins are similar to baseline. #. Hyperlipidemia:  -Continue clopidogrel  75 mg once daily. -Continue atorvastatin  80 mg daily. Continue Zetia  10 mg daily.   Follow up with Dr. Kennyth 3 months after ablation.    Signed, Fonda Kennyth, MD

## 2024-11-12 NOTE — Plan of Care (Signed)
°  Problem: Education: Goal: Understanding of disease, treatment, and recovery process will improve Outcome: Progressing   Problem: Activity: Goal: Ability to return to baseline activity level will improve Outcome: Progressing   Problem: Cardiac: Goal: Ability to maintain adequate cardiovascular perfusion will improve Outcome: Progressing Goal: Vascular access site(s) Level 0-1 will be maintained Outcome: Progressing   Problem: Health Behavior/ Discharge Planning: Goal: Ability to safely manage health related needs after discharge Outcome: Progressing   Problem: Education: Goal: Ability to describe self-care measures that may prevent or decrease complications (Diabetes Survival Skills Education) will improve Outcome: Progressing Goal: Individualized Educational Video(s) Outcome: Progressing   Problem: Coping: Goal: Ability to adjust to condition or change in health will improve Outcome: Progressing   Problem: Fluid Volume: Goal: Ability to maintain a balanced intake and output will improve Outcome: Progressing   Problem: Health Behavior/Discharge Planning: Goal: Ability to identify and utilize available resources and services will improve Outcome: Progressing Goal: Ability to manage health-related needs will improve Outcome: Progressing   Problem: Metabolic: Goal: Ability to maintain appropriate glucose levels will improve Outcome: Progressing   Problem: Nutritional: Goal: Maintenance of adequate nutrition will improve Outcome: Progressing Goal: Progress toward achieving an optimal weight will improve Outcome: Progressing   Problem: Skin Integrity: Goal: Risk for impaired skin integrity will decrease Outcome: Progressing   Problem: Tissue Perfusion: Goal: Adequacy of tissue perfusion will improve Outcome: Progressing   Problem: Education: Goal: Knowledge of General Education information will improve Description: Including pain rating scale, medication(s)/side  effects and non-pharmacologic comfort measures Outcome: Progressing   Problem: Health Behavior/Discharge Planning: Goal: Ability to manage health-related needs will improve Outcome: Progressing   Problem: Clinical Measurements: Goal: Ability to maintain clinical measurements within normal limits will improve Outcome: Progressing Goal: Will remain free from infection Outcome: Progressing Goal: Diagnostic test results will improve Outcome: Progressing Goal: Respiratory complications will improve Outcome: Progressing Goal: Cardiovascular complication will be avoided Outcome: Progressing   Problem: Activity: Goal: Risk for activity intolerance will decrease Outcome: Progressing   Problem: Nutrition: Goal: Adequate nutrition will be maintained Outcome: Progressing   Problem: Coping: Goal: Level of anxiety will decrease Outcome: Progressing   Problem: Elimination: Goal: Will not experience complications related to bowel motility Outcome: Progressing Goal: Will not experience complications related to urinary retention Outcome: Progressing   Problem: Pain Managment: Goal: General experience of comfort will improve and/or be controlled Outcome: Progressing   Problem: Safety: Goal: Ability to remain free from injury will improve Outcome: Progressing   Problem: Skin Integrity: Goal: Risk for impaired skin integrity will decrease Outcome: Progressing

## 2024-11-12 NOTE — Progress Notes (Signed)
 MD Genny called in order to address patient oxygen saturation, patient is on 4 L stat's are anywhere from 85-91% does have COPD, IS was instructed for patient.

## 2024-11-12 NOTE — Anesthesia Preprocedure Evaluation (Addendum)
 Anesthesia Evaluation  Patient identified by MRN, date of birth, ID band Patient awake    Reviewed: Allergy & Precautions, NPO status , Patient's Chart, lab work & pertinent test results  Airway Mallampati: III  TM Distance: >3 FB Neck ROM: Full    Dental  (+) Edentulous Upper, Loose, Missing,    Pulmonary COPD,  COPD inhaler and oxygen dependent, Current SmokerPatient did not abstain from smoking. 2L [place]   Pulmonary exam normal breath sounds clear to auscultation       Cardiovascular hypertension, + angina  + CAD, + Past MI, + Cardiac Stents, + CABG, + Peripheral Vascular Disease and +CHF  Normal cardiovascular exam+ dysrhythmias Atrial Fibrillation + Valvular Problems/Murmurs AS  Rhythm:Regular Rate:Normal  ECHO: 1. Difficult acoustic windows Hypokinesis/ akinesis of the distal lateral  and apical walls.. Left ventricular ejection fraction, by estimation, is  45%. The left ventricle has mildly decreased function. The left ventricle  demonstrates global hypokinesis.   Left ventricular diastolic parameters are consistent with Grade II  diastolic dysfunction (pseudonormalization).   2. Right ventricular systolic function is low normal. The right  ventricular size is normal.   3. Left atrial size was mildly dilated.   4. Trivial mitral valve regurgitation.   5. AV is thickened, calcified with minimally restricted motion Mean  gradeint through the valve is 13 mm Hg. . The aortic valve is tricuspid.  Aortic valve regurgitation is not visualized. Mild aortic valve stenosis.   6. The inferior vena cava is normal in size with greater than 50%  respiratory variability, suggesting right atrial pressure of 3 mmHg.     Neuro/Psych negative neurological ROS  negative psych ROS   GI/Hepatic Neg liver ROS,GERD  Medicated and Controlled,,  Endo/Other  diabetes, Insulin  Dependent    Renal/GU Renal disease     Musculoskeletal    Abdominal  (+) + obese  Peds  Hematology  (+) Blood dyscrasia (Plavix )   Anesthesia Other Findings atrial fibrillation  Reproductive/Obstetrics                              Anesthesia Physical Anesthesia Plan  ASA: 4  Anesthesia Plan: General   Post-op Pain Management:    Induction: Intravenous  PONV Risk Score and Plan: 1 and Ondansetron , Dexamethasone , Midazolam  and Treatment may vary due to age or medical condition  Airway Management Planned: Oral ETT  Additional Equipment:   Intra-op Plan:   Post-operative Plan: Extubation in OR  Informed Consent: I have reviewed the patients History and Physical, chart, labs and discussed the procedure including the risks, benefits and alternatives for the proposed anesthesia with the patient or authorized representative who has indicated his/her understanding and acceptance.     Dental advisory given  Plan Discussed with: CRNA  Anesthesia Plan Comments:          Anesthesia Quick Evaluation

## 2024-11-12 NOTE — Discharge Instructions (Addendum)
 Please increase your Torsemide  to 80 mg BID (twice daily) for 3 days, then resume your normal dosing.  You should have labs on 12/15 with Dr. Zenaida.      Cardiac Ablation, Care After  This sheet gives you information about how to care for yourself after your procedure. Your health care provider may also give you more specific instructions. If you have problems or questions, contact your health care provider. What can I expect after the procedure? After the procedure, it is common to have: Bruising around your puncture site. Tenderness around your puncture site. Skipped heartbeats. If you had an atrial fibrillation ablation, you may have atrial fibrillation during the first several months after your procedure.  Tiredness (fatigue).  Follow these instructions at home: Puncture site care  Follow instructions from your health care provider about how to take care of your puncture site. Make sure you: If present, leave stitches (sutures), skin glue, or adhesive strips in place. These skin closures may need to stay in place for up to 2 weeks. If adhesive strip edges start to loosen and curl up, you may trim the loose edges. Do not remove adhesive strips completely unless your health care provider tells you to do that. If a large square bandage is present, this may be removed 24 hours after surgery.  Check your puncture site every day for signs of infection. Check for: Redness, swelling, or pain. Fluid or blood. If your puncture site starts to bleed, lie down on your back, apply firm pressure to the area, and contact your health care provider. Warmth. Pus or a bad smell. A pea or marble sized lump/knot at the site is normal and can take up to three months to resolve.  Driving Do not drive for at least 4 days after your procedure or however long your health care provider recommends. (Do not resume driving if you have previously been instructed not to drive for other health reasons.) Do not drive  or use heavy machinery while taking prescription pain medicine. Activity Avoid activities that take a lot of effort for at least 7 days after your procedure. Do not lift anything that is heavier than 5 lb (4.5 kg) for one week.  No sexual activity for 1 week.  Return to your normal activities as told by your health care provider. Ask your health care provider what activities are safe for you. General instructions Take over-the-counter and prescription medicines only as told by your health care provider. Do not use any products that contain nicotine  or tobacco, such as cigarettes and e-cigarettes. If you need help quitting, ask your health care provider. You may shower after 24 hours, but Do not take baths, swim, or use a hot tub for 1 week.  Do not drink alcohol for 24 hours after your procedure. Keep all follow-up visits as told by your health care provider. This is important. Contact a health care provider if: You have redness, mild swelling, or pain around your puncture site. You have fluid or blood coming from your puncture site that stops after applying firm pressure to the area. Your puncture site feels warm to the touch. You have pus or a bad smell coming from your puncture site. You have a fever. You have chest pain or discomfort that spreads to your neck, jaw, or arm. You have chest pain that is worse with lying on your back or taking a deep breath. You are sweating a lot. You feel nauseous. You have a fast or irregular  heartbeat. You have shortness of breath. You are dizzy or light-headed and feel the need to lie down. You have pain or numbness in the arm or leg closest to your puncture site. Get help right away if: Your puncture site suddenly swells. Your puncture site is bleeding and the bleeding does not stop after applying firm pressure to the area. These symptoms may represent a serious problem that is an emergency. Do not wait to see if the symptoms will go away. Get  medical help right away. Call your local emergency services (911 in the [place] Do not drive yourself to the hospital. Summary After the procedure, it is normal to have bruising and tenderness at the puncture site in your groin, neck, or forearm. Check your puncture site every day for signs of infection. Get help right away if your puncture site is bleeding and the bleeding does not stop after applying firm pressure to the area. This is a medical emergency. This information is not intended to replace advice given to you by your health care provider. Make sure you discuss any questions you have with your health care provider.

## 2024-11-12 NOTE — Progress Notes (Signed)
 MD Kennyth currently in case but was paged d/t patient blood sugar being 271, no new orders at this time.

## 2024-11-12 NOTE — Anesthesia Procedure Notes (Signed)
 Procedure Name: Intubation Date/Time: 11/12/2024 8:48 AM  Performed by: Genny Gun, CRNAPre-anesthesia Checklist: Patient identified, Emergency Drugs available, Suction available, Patient being monitored and Timeout performed Patient Re-evaluated:Patient Re-evaluated prior to induction Oxygen Delivery Method: Circle system utilized Preoxygenation: Pre-oxygenation with 100% oxygen Induction Type: IV induction Ventilation: Mask ventilation without difficulty and Oral airway inserted - appropriate to patient size Laryngoscope Size: Mac and 4 Grade View: Grade I Tube type: Oral Tube size: 7.5 mm Number of attempts: 1 Placement Confirmation: ETT inserted through vocal cords under direct vision, positive ETCO2 and breath sounds checked- equal and bilateral Secured at: 22 cm Tube secured with: Tape Dental Injury: Teeth and Oropharynx as per pre-operative assessment

## 2024-11-12 NOTE — Progress Notes (Addendum)
 Report called to RN taking pateint on floor in 6E, [place], no further questions at this time.

## 2024-11-12 NOTE — Transfer of Care (Signed)
 Immediate Anesthesia Transfer of Care Note  Patient: Ryan [name]  Procedure(s) Performed: ATRIAL FIBRILLATION ABLATION  Patient Location: PACU and Cath Lab  Anesthesia Type:General  Level of Consciousness: awake, oriented, and patient cooperative  Airway & Oxygen Therapy: Patient Spontanous Breathing and Patient connected to face mask oxygen  Post-op Assessment: Report given to RN and Post -op Vital signs reviewed and stable  Post vital signs: Reviewed and stable  Last Vitals:  Vitals Value Taken Time  BP 141/88 11/12/24 10:35  Temp    Pulse 87 11/12/24 10:40  Resp 18 11/12/24 10:40  SpO2 87 % 11/12/24 10:40  Vitals shown include unfiled device data.  Last Pain: There were no vitals filed for this visit.       Complications: No notable events documented.

## 2024-11-12 NOTE — Progress Notes (Signed)
 Pt's blood glucose 243 at 10:50. Dr. Kennyth notified and request made fopr pt's at home Lantus  and Humalog . Dr. Kennyth stated he is ok with pt's blood glucose over 200 at this time and is not concerned. No new order at this time.

## 2024-11-13 ENCOUNTER — Ambulatory Visit (HOSPITAL_COMMUNITY)

## 2024-11-13 ENCOUNTER — Encounter

## 2024-11-13 ENCOUNTER — Encounter (HOSPITAL_COMMUNITY): Payer: Self-pay | Admitting: Cardiology

## 2024-11-13 ENCOUNTER — Ambulatory Visit: Admitting: Pulmonary Disease

## 2024-11-13 DIAGNOSIS — I480 Paroxysmal atrial fibrillation: Secondary | ICD-10-CM | POA: Diagnosis present

## 2024-11-13 LAB — BASIC METABOLIC PANEL WITH GFR
Anion gap: 9 (ref 5–15)
BUN: 35 mg/dL — ABNORMAL HIGH (ref 6–20)
CO2: 29 mmol/L (ref 22–32)
Calcium: 8.2 mg/dL — ABNORMAL LOW (ref 8.9–10.3)
Chloride: 89 mmol/L — ABNORMAL LOW (ref 98–111)
Creatinine, Ser: 1.66 mg/dL — ABNORMAL HIGH (ref 0.61–1.24)
GFR, Estimated: 47 mL/min — ABNORMAL LOW (ref >60)
Glucose, Bld: 253 mg/dL — ABNORMAL HIGH (ref 70–99)
Potassium: 4.6 mmol/L (ref 3.5–5.1)
Sodium: 127 mmol/L — ABNORMAL LOW (ref 135–145)

## 2024-11-13 LAB — BRAIN NATRIURETIC PEPTIDE: B Natriuretic Peptide: 1211.2 pg/mL — ABNORMAL HIGH (ref 0.0–100.0)

## 2024-11-13 LAB — GLUCOSE, CAPILLARY
Glucose-Capillary: 235 mg/dL — ABNORMAL HIGH (ref 70–99)
Glucose-Capillary: 264 mg/dL — ABNORMAL HIGH (ref 70–99)
Glucose-Capillary: 262 mg/dL — ABNORMAL HIGH (ref 70–99)
Glucose-Capillary: 331 mg/dL — ABNORMAL HIGH (ref 70–99)

## 2024-11-13 LAB — POCT ACTIVATED CLOTTING TIME
Activated Clotting Time: 271 s
Activated Clotting Time: 337 s

## 2024-11-13 MED ORDER — FUROSEMIDE 10 MG/ML IJ SOLN
120.0000 mg | Freq: Two times a day (BID) | INTRAVENOUS | Status: DC
  Filled 2024-11-13 (×2): qty 12

## 2024-11-13 MED ORDER — IPRATROPIUM-ALBUTEROL 0.5-2.5 (3) MG/3ML IN SOLN: 3.0000 mL | Freq: Two times a day (BID) | RESPIRATORY_TRACT | Status: DC

## 2024-11-13 MED ORDER — FUROSEMIDE 10 MG/ML IJ SOLN
40.0000 mg | Freq: Once | INTRAMUSCULAR | Status: AC
  Administered 2024-11-13: 40 mg via INTRAVENOUS
  Filled 2024-11-13: qty 4

## 2024-11-13 MED ORDER — TORSEMIDE 20 MG PO TABS: 40.0000 mg | ORAL_TABLET | Freq: Two times a day (BID) | ORAL | Status: DC

## 2024-11-13 NOTE — Plan of Care (Signed)
°  Problem: Education: Goal: Understanding of disease, treatment, and recovery process will improve Outcome: Progressing   Problem: Activity: Goal: Ability to return to baseline activity level will improve Outcome: Progressing   Problem: Cardiac: Goal: Ability to maintain adequate cardiovascular perfusion will improve Outcome: Progressing Goal: Vascular access site(s) Level 0-1 will be maintained Outcome: Progressing   Problem: Health Behavior/ Discharge Planning: Goal: Ability to safely manage health related needs after discharge Outcome: Progressing   Problem: Education: Goal: Ability to describe self-care measures that may prevent or decrease complications (Diabetes Survival Skills Education) will improve Outcome: Progressing Goal: Individualized Educational Video(s) Outcome: Progressing   Problem: Coping: Goal: Ability to adjust to condition or change in health will improve Outcome: Progressing   Problem: Fluid Volume: Goal: Ability to maintain a balanced intake and output will improve Outcome: Progressing   Problem: Health Behavior/Discharge Planning: Goal: Ability to identify and utilize available resources and services will improve Outcome: Progressing Goal: Ability to manage health-related needs will improve Outcome: Progressing   Problem: Metabolic: Goal: Ability to maintain appropriate glucose levels will improve Outcome: Progressing   Problem: Nutritional: Goal: Maintenance of adequate nutrition will improve Outcome: Progressing Goal: Progress toward achieving an optimal weight will improve Outcome: Progressing   Problem: Skin Integrity: Goal: Risk for impaired skin integrity will decrease Outcome: Progressing   Problem: Tissue Perfusion: Goal: Adequacy of tissue perfusion will improve Outcome: Progressing   Problem: Education: Goal: Knowledge of General Education information will improve Description: Including pain rating scale, medication(s)/side  effects and non-pharmacologic comfort measures Outcome: Progressing   Problem: Health Behavior/Discharge Planning: Goal: Ability to manage health-related needs will improve Outcome: Progressing   Problem: Clinical Measurements: Goal: Ability to maintain clinical measurements within normal limits will improve Outcome: Progressing Goal: Will remain free from infection Outcome: Progressing Goal: Diagnostic test results will improve Outcome: Progressing Goal: Respiratory complications will improve Outcome: Progressing Goal: Cardiovascular complication will be avoided Outcome: Progressing   Problem: Activity: Goal: Risk for activity intolerance will decrease Outcome: Progressing   Problem: Nutrition: Goal: Adequate nutrition will be maintained Outcome: Progressing   Problem: Coping: Goal: Level of anxiety will decrease Outcome: Progressing   Problem: Elimination: Goal: Will not experience complications related to bowel motility Outcome: Progressing Goal: Will not experience complications related to urinary retention Outcome: Progressing   Problem: Pain Managment: Goal: General experience of comfort will improve and/or be controlled Outcome: Progressing   Problem: Safety: Goal: Ability to remain free from injury will improve Outcome: Progressing   Problem: Skin Integrity: Goal: Risk for impaired skin integrity will decrease Outcome: Progressing

## 2024-11-13 NOTE — Progress Notes (Addendum)
 Notified that patient had an unwitnessed fall on the floor. He was found calling out for help and found on the floor bedside RN. He doesn't recall the series of events very well and is unsure if he had a head strike or if there was loss of consciousness. Given is on chronic anticoagulation with fall of unclear mechanism and unclear degree of injury, we will obtain a CT head to rule out head bleed out of abundance of caution.  Curtistine LITTIE Farr, MD Cardiology 11/13/2024

## 2024-11-13 NOTE — TOC CM/SW Note (Signed)
 Transition of Care [facility]) - Inpatient Brief Assessment   Patient Details  Name: Timoth Schara MRN: [id number] Date of Birth: Sep 21, 1966  [facility]) CM/SW Contact:    Sudie Erminio Deems, RN Phone Number: 11/13/2024, 12:03 PM   Clinical Narrative: Patient presented for evaluation for atrial flutter. PTA patient was from home with support of nephew and daughter. Patient has a PCP and he has transportation to appointments. Patient currently has an oxygen concentrator and a portable tank via [employer]. ICM received verbal permission to call daughter and she states that pulmonary has submitted a new order to [facility] [place] in [place]; however, the concentrator has not been delivered. Patient is not active with any HH Services. Daughter states she will assist in transportation home. No further needs identified at this time.   Transition of Care Asessment: Insurance and Status: Insurance coverage has been reviewed Patient has primary care physician: Yes Home environment has been reviewed: reviewed Prior level of function:: independent Prior/Current Home Services: No current home services Social Drivers of Health Review: SDOH reviewed no interventions necessary Readmission risk has been reviewed: Yes Transition of care needs: no transition of care needs at this time

## 2024-11-13 NOTE — Progress Notes (Addendum)
 Patient was found on the floor in his room by Channing, RN, who notified this RN. Upon entering the room, patient remained on the floor. When asked what occurred, patient stated he had been walking when he became dizzy. Patient was unsure whether he struck his head or lost consciousness.  Patient was assisted from the floor and escorted to the bed. Vital signs were obtained and a full assessment was completed. The on-call provider was notified of the fall, and a CT scan without contrast was ordered.  Patient declined family notification.    11/13/24 0110  What Happened  Was fall witnessed? No  Was patient injured? Unsure  Patient found on floor  Found by Staff-comment Bethann RN)  Stated prior activity ambulating-unassisted  Provider Notification  Provider Name/Title Dr. Melia  Date Provider Notified 11/13/24  Time Provider Notified 0122  Method of Notification Page;Call  Notification Reason Fall  Provider response See new orders  Date of Provider Response 11/13/24  Time of Provider Response 0136  Adult Fall Risk Assessment  Risk Factor Category (scoring not indicated) Fall has occurred during this admission (document High fall risk)  Age 58  Fall History: Fall within 6 months prior to admission 0  Elimination; Bowel and/or Urine Incontinence 0  Elimination; Bowel and/or Urine Urgency/Frequency 0  Medications: includes PCA/Opiates, Anti-convulsants, Anti-hypertensives, Diuretics, Hypnotics, Laxatives, Sedatives, and Psychotropics 3  Patient Care Equipment 1  Mobility-Assistance 2  Mobility-Gait 0  Mobility-Sensory Deficit 0  Altered awareness of immediate physical environment 0  Impulsiveness 2  Lack of understanding of one's physical/cognitive limitations 0  Total Score 8  Patient Fall Risk Level High fall risk  Adult Fall Risk Interventions  Required Bundle Interventions *See Row Information* High fall risk  Additional Interventions Use of appropriate toileting equipment  (bedpan, BSC, etc.)  Fall intervention(s) refused/Patient educated regarding refusal Bed alarm;Nonskid socks  Screening for Fall Injury Risk (To be completed on HIGH fall risk patients) - Assessing Need for Floor Mats  Risk For Fall Injury- Criteria for Floor Mats Bleeding risk-anticoagulation (not prophylaxis)  Vitals  BP 93/84  MAP (mmHg) 88  Pulse Rate 88  ECG Heart Rate 90  Oxygen Therapy  SpO2 (!) 88 %  O2 Device Room Air  Neurological  Neuro (WDL) WDL  Level of Consciousness Alert  Orientation Level Oriented X4  Musculoskeletal  Musculoskeletal (WDL) X  Assistive Device None  Generalized Weakness Yes  Integumentary  Integumentary (WDL) WDL

## 2024-11-13 NOTE — Discharge Summary (Addendum)
 ELECTROPHYSIOLOGY PROCEDURE DISCHARGE SUMMARY    Patient ID: Ryan [name],  MRN: [id number], DOB/AGE: [birth year] 58 y.o.  Admit date: 11/12/2024 Discharge date: 11/13/2024  Primary Care Physician: [name], MD  Primary Cardiologist: [name], MD  Electrophysiologist: Dr. Kennyth   Primary Discharge Diagnosis:  Atrial Fibrillation  Secondary Discharge Diagnosis:  Secondary Hypercoagulable State  CAD s/p STEMI 2013 with multiple PCI's  PAD  HFrEF  HTN  DM  COPD  Tobacco Abuse   Procedures This Admission:  1.  Electrophysiology study and Pulse Field catheter ablation of Atrial Fibrillation on 11/12/24 by Dr. Kennyth .  This study demonstrated:   1. Successful PVI. 2. CTI remained blocked from prior ablation. 3. Intracardiac echo reveals mildly reduced LV function, trivial pericardial effusion. 4. No early apparent complications. 5. Resume Eliquis  in recovery area.  6. Stop amiodarone . .        Brief HPI: Ryan [name] is a 58 y.o. male with a history of Atrial Fibrillation.  They were maintained on medical therapy with amiodarone  pre-ablation. Risks, benefits, and alternatives to catheter ablation of Atrial Fibrillation were reviewed with the patient who wished to proceed.   The patient has been on uninterrupted anticoagulation for more than 3 weeks and did not require TEE.  Hospital Course:  The patient was admitted and underwent EPS and ablation of Atrial Fibrillation with details as outlined above. He was given additional diuresis during admit. O2 was weaned to patients baseline of 2L.  They were monitored on telemetry overnight which demonstrated SR 70-80's.  Amiodarone  was stopped post ablation. Groin was without complication on the day of discharge.  The patient was examined and considered to be stable for discharge.  Wound care and restrictions were reviewed with the patient.  The patient will be seen back by Afib Clinic in 4 weeks and EP APP in 12  weeks for post ablation follow up.  He was instructed to increase his torsemide  to 80 mg BID for 3 days post discharge. He has follow up on 12/15 (pre-existing) and BMP has been requested for visit.   Physical Exam: Vitals:   11/13/24 0907 11/13/24 1042 11/13/24 1127 11/13/24 1229  BP:   125/67   Pulse:  77 74 77  Resp: 18  20   Temp:   97.8 F (36.6 C)   TempSrc:   Oral   SpO2:   92% 90%  Weight:      Height:        GEN- chronically ill appearing adult male lying in bed in NAD. A&O x 3.  HEENT: Normocephalic, atraumatic Lungs- CTAB, Normal effort.  Heart- RRR, No M/G/R.  GI- Soft, NT, ND.  Extremities- No clubbing, cyanosis, or edema;  Skin- warm and dry, no rash or lesion, groin site without hematoma / ecchymosis   Discharge Medications:  Allergies as of 11/13/2024       Reactions   Metformin Anaphylaxis   Water blisters between fingers   Hydrocodone  Hives, Nausea And Vomiting        Medication List     STOP taking these medications    amiodarone  200 MG tablet Commonly known as: PACERONE        TAKE these medications    albuterol  108 (90 Base) MCG/ACT inhaler Commonly known as: VENTOLIN  HFA Inhale 1-2 puffs into the lungs every 6 (six) hours as needed for wheezing or shortness of breath.   cetirizine 10 MG tablet Commonly known as: ZYRTEC Take 10 mg by mouth daily as  needed for allergies.   clopidogrel  75 MG tablet Commonly known as: PLAVIX  TAKE 1 TABLET BY MOUTH EVERY DAY   diazepam  10 MG tablet Commonly known as: VALIUM  Take 10 mg by mouth at bedtime.   digoxin  0.125 MG tablet Commonly known as: LANOXIN  Take 125 mcg by mouth daily.   Eliquis  5 MG Tabs tablet Generic drug: apixaban  TAKE 1 TABLET BY MOUTH 2 TIMES A DAY   ezetimibe  10 MG tablet Commonly known as: ZETIA  TAKE 1 TABLET BY MOUTH EACH DAY   gabapentin  300 MG capsule Commonly known as: NEURONTIN  Take 300 mg by mouth 2 (two) times daily.   icosapent  Ethyl 1 g capsule Commonly  known as: VASCEPA  TAKE 2 CAPSULES BY MOUTH TWICE DAILY   insulin  glargine 100 UNIT/ML Solostar Pen Commonly known as: LANTUS  Inject 50 Units into the skin 2 (two) times daily.   insulin  lispro 100 UNIT/ML injection Commonly known as: HUMALOG  Inject 5-15 Units into the skin 3 (three) times daily as needed (blood sugar over 150).   methocarbamol 500 MG tablet Commonly known as: ROBAXIN Take 500 mg by mouth every 6 (six) hours as needed for muscle spasms.   metolazone  5 MG tablet Commonly known as: ZAROXOLYN  Take 1 tablet (5 mg total) by mouth as directed. TAKE 1 TABLET 5MG  TODAY, AND TAKE 1 TABLET 5MG  TOMORROW, TAKE AN EXTRA OF POTASSIUM WITH METOLAZONE  DOSAGE   nitroGLYCERIN  0.4 MG SL tablet Commonly known as: NITROSTAT  Place 0.4 mg under the tongue every 5 (five) minutes as needed for chest pain.   oxyCODONE -acetaminophen  10-325 MG tablet Commonly known as: PERCOCET Take 1 tablet by mouth 4 (four) times daily.   OXYGEN Inhale 2 L into the lungs as needed (excessive movement).   pantoprazole  40 MG tablet Commonly known as: PROTONIX  Take 40 mg by mouth daily.   potassium chloride  SA 20 MEQ tablet Commonly known as: KLOR-CON  M TAKE 1 TABLET BY MOUTH 2 TIMES A DAY   ramelteon  8 MG tablet Commonly known as: Rozerem  Take 1 tablet (8 mg total) by mouth at bedtime.   Repatha  SureClick 140 MG/ML Soaj Generic drug: Evolocumab  INJECT 140 MG INTO THE SKIN EVERY 14 DAYS AS DIRECTED   spironolactone  25 MG tablet Commonly known as: ALDACTONE  Take 1 tablet (25 mg total) by mouth every evening.   Stiolto Respimat  2.5-2.5 MCG/ACT Aers Generic drug: Tiotropium Bromide-Olodaterol Inhale 2 puffs into the lungs daily.   torsemide  20 MG tablet Commonly known as: DEMADEX  Take 2 tablets (40 mg total) by mouth 2 (two) times daily. Starting 11/14/24 take 4 tablets (80mg ) twice daily (am & pm) for three days then return to 2 tablets (40 mg) twice daily (am & pm) What changed:  additional instructions   traZODone  100 MG tablet Commonly known as: DESYREL  Take 100 mg by mouth at bedtime.   Trintellix  20 MG Tabs tablet Generic drug: vortioxetine  HBr Take 20 mg by mouth daily in the afternoon.   Vitamin D (Ergocalciferol) 1.25 MG (50000 UNIT) Caps capsule Commonly known as: DRISDOL Take 50,000 Units by mouth every Saturday.        Disposition: Home with usual follow up as in AVS   Signed, Daphne Barrack, NP-C, AGACNP-BC [facility] - Electrophysiology  11/13/2024, 2:13 PM   I have seen, examined the patient, and reviewed the above assessment and plan.    Hospital course: Patient presented on 12/10 for scheduled AF ablation.  He underwent successful PVI.  Postoperatively he had difficulty weaning from 4  L nasal cannula back to his home O2 requirement of 2 L.  After discussing with patient and his daughter, they stated that patient had gained some weight and developed lower extremity edema.  We kept patient overnight for routine monitoring as well as for IV diuresis.  He had approximately 2 L urine output with symptomatic improvement and decrease in his oxygen requirement back to his home 2 L nasal cannula.  There were no obvious procedural complications.  Patient adamant about being discharged home.  He has outpatient follow-up with our HF clinic on 12/15.  General: Well developed, in no acute distress.  Neck: No JVD.  Cardiac: Normal rate, regular rhythm.  Resp: Normal work of breathing.  Ext: 1+ edema.  Neuro: No gross focal deficits.  Psych: Normal affect.   Assessment and Plan:  #. Paroxysmal atrial fibrillation: Symptomatic. Diagnosed on ILR post AFL ablation. H/o drop in LVEF while in AFL. For this reason we have prioritized a rhythm control strategy. Would like to avoid long term amiodarone  d/t lung disease.  Now s/p PVI on 11/12/24. #. Typical atrial flutter: S/p CTI ablation on 05/05/24. #. Secondary hypercoagulable state due to atrial  flutter: CHA2DS2-VASc score of 4. #. S/p loop recorder -Stop amiodarone . -Continue digoxin . -Continue Eliquis  5 mg twice daily with no interruption.   #.  Acute on chronic combined systolic and diastolic heart failure: EF improved from 25-30% in AFL to 45% in NSR.  Elevated BNP, peripheral edema and congestion on chest x-ray. - Good urine output with IV Lasix  with some symptomatic improvement.  Patient unwilling to stay for additional IV Lasix .  Will discharge with double his usual dose of torsemide  for the next 3 days.  He will then follow-up in HF clinic on 12/15.  Duration of discharge encounter: 35 minutes.  Fonda Kitty, MD 11/13/2024 9:06 PM

## 2024-11-13 NOTE — Progress Notes (Signed)
 Reviewed AVS, patient expressed understanding of medications, MD follow up reviewed.   Patient states all belongings brought to the hospital at time of admission are accounted for and packed to take home.  Nursing staff contacted to transport patient to entrance A,where family member was waiting in vehicle to transport home.

## 2024-11-13 NOTE — Anesthesia Postprocedure Evaluation (Signed)
 Anesthesia Post Note  Patient: Ryan [name]  Procedure(s) Performed: ATRIAL FIBRILLATION ABLATION     Patient location during evaluation: Cath Lab Anesthesia Type: General Level of consciousness: awake Pain management: pain level controlled Vital Signs Assessment: post-procedure vital signs reviewed and stable Respiratory status: spontaneous breathing Cardiovascular status: blood pressure returned to baseline Postop Assessment: no apparent nausea or vomiting Anesthetic complications: no   No notable events documented.  Last Vitals:  Vitals:   11/13/24 1127 11/13/24 1229  BP: 125/67   Pulse: 74 77  Resp: 20   Temp: 36.6 C   SpO2: 92% 90%    Last Pain:  Vitals:   11/13/24 1229  TempSrc:   PainSc: 6                  Kem Parcher P Roselyn Doby

## 2024-11-14 ENCOUNTER — Telehealth: Payer: Self-pay

## 2024-11-14 ENCOUNTER — Telehealth (HOSPITAL_COMMUNITY): Payer: Self-pay | Admitting: Cardiology

## 2024-11-14 NOTE — Telephone Encounter (Signed)
 Copied from CRM #[id number]. Topic: Clinical - Prescription Issue >> Nov 13, 2024 11:50 AM Corean SAUNDERS wrote: Reason for CRM: Patients daughter Jonette states she called [facility] today and states they have not received the patients oxygen order yet but Comer Rouleau did indeed send it. Jonette is kindly requesting this matter to be resolved and to call her after this is completed at [phone] >> Nov 13, 2024  4:07 PM Isabell A wrote: Daughter Jonette - would like a call back once this is sent.   Callback number: [phone]     [facility] can we resend the order that was placed on 12/8 to [facility].

## 2024-11-14 NOTE — Telephone Encounter (Signed)
 Order has been re sent to [facility]. NFN

## 2024-11-14 NOTE — Telephone Encounter (Signed)
 Called to confirm/remind patient of their appointment at the [facility] on 11/14/2024.   Appointment:   [] Confirmed  [x] Left mess   [] No answer/No voice mail  [] VM Full/unable to leave message  [] Phone not in service  Patient reminded to bring all medications and/or complete list.  Confirmed patient has transportation. Gave directions, instructed to utilize valet parking.

## 2024-11-17 ENCOUNTER — Emergency Department (HOSPITAL_COMMUNITY)

## 2024-11-17 ENCOUNTER — Telehealth: Payer: Self-pay

## 2024-11-17 ENCOUNTER — Ambulatory Visit: Payer: Self-pay | Admitting: Nurse Practitioner

## 2024-11-17 ENCOUNTER — Emergency Department (HOSPITAL_COMMUNITY)
Admission: EM | Admit: 2024-11-17 | Discharge: 2024-11-17 | Discharge status: Against Medical Advice | Attending: Emergency Medicine | Admitting: Emergency Medicine

## 2024-11-17 ENCOUNTER — Encounter (HOSPITAL_COMMUNITY): Payer: Self-pay

## 2024-11-17 ENCOUNTER — Telehealth (HOSPITAL_COMMUNITY): Payer: Self-pay | Admitting: *Deleted

## 2024-11-17 ENCOUNTER — Other Ambulatory Visit: Payer: Self-pay

## 2024-11-17 ENCOUNTER — Ambulatory Visit (HOSPITAL_COMMUNITY): Admission: RE | Admit: 2024-11-17 | Discharge: 2024-11-17 | Attending: Cardiology | Admitting: Cardiology

## 2024-11-17 DIAGNOSIS — R0602 Shortness of breath: Secondary | ICD-10-CM | POA: Diagnosis not present

## 2024-11-17 DIAGNOSIS — R197 Diarrhea, unspecified: Secondary | ICD-10-CM | POA: Diagnosis not present

## 2024-11-17 DIAGNOSIS — R600 Localized edema: Secondary | ICD-10-CM | POA: Diagnosis present

## 2024-11-17 DIAGNOSIS — Z5321 Procedure and treatment not carried out due to patient leaving prior to being seen by health care provider: Secondary | ICD-10-CM | POA: Diagnosis not present

## 2024-11-17 LAB — CBC
HCT: 33.7 % — ABNORMAL LOW (ref 39.0–52.0)
Hemoglobin: 9.9 g/dL — ABNORMAL LOW (ref 13.0–17.0)
MCH: 22.4 pg — ABNORMAL LOW (ref 26.0–34.0)
MCHC: 29.4 g/dL — ABNORMAL LOW (ref 30.0–36.0)
MCV: 76.4 fL — ABNORMAL LOW (ref 80.0–100.0)
Platelets: 224 K/uL (ref 150–400)
RBC: 4.41 MIL/uL (ref 4.22–5.81)
RDW: 22 % — ABNORMAL HIGH (ref 11.5–15.5)
WBC: 9.4 K/uL (ref 4.0–10.5)
nRBC: 0 % (ref 0.0–0.2)

## 2024-11-17 LAB — COMPREHENSIVE METABOLIC PANEL WITH GFR
ALT: 13 U/L (ref 0–44)
AST: 20 U/L (ref 15–41)
Albumin: 2.8 g/dL — ABNORMAL LOW (ref 3.5–5.0)
Alkaline Phosphatase: 267 U/L — ABNORMAL HIGH (ref 38–126)
Anion gap: 11 (ref 5–15)
BUN: 21 mg/dL — ABNORMAL HIGH (ref 6–20)
CO2: 30 mmol/L (ref 22–32)
Calcium: 8.3 mg/dL — ABNORMAL LOW (ref 8.9–10.3)
Chloride: 92 mmol/L — ABNORMAL LOW (ref 98–111)
Creatinine, Ser: 1.31 mg/dL — ABNORMAL HIGH (ref 0.61–1.24)
GFR, Estimated: >60 mL/min (ref >60)
Glucose, Bld: 325 mg/dL — ABNORMAL HIGH (ref 70–99)
Potassium: 3.4 mmol/L — ABNORMAL LOW (ref 3.5–5.1)
Sodium: 133 mmol/L — ABNORMAL LOW (ref 135–145)
Total Bilirubin: 1.9 mg/dL — ABNORMAL HIGH (ref 0.0–1.2)
Total Protein: 7.4 g/dL (ref 6.5–8.1)

## 2024-11-17 LAB — RESP PANEL BY RT-PCR (RSV, FLU A&B, COVID)  RVPGX2
Influenza A by PCR: NEGATIVE
Influenza B by PCR: NEGATIVE
Resp Syncytial Virus by PCR: NEGATIVE
SARS Coronavirus 2 by RT PCR: NEGATIVE

## 2024-11-17 LAB — BRAIN NATRIURETIC PEPTIDE: B Natriuretic Peptide: 1251 pg/mL — ABNORMAL HIGH (ref 0.0–100.0)

## 2024-11-17 NOTE — Progress Notes (Incomplete)
° °  ADVANCED HEART FAILURE FOLLOW UP CLINIC NOTE  Referring Physician: [name], MD  Primary Care: [name], MD Primary Cardiologist:  HPI: Ryan [name] is a 58 y.o. male with a PMH of CAD with multiple PCI and CABG, PAD, HLD, LV apical thrombus, carotid artery disease, tobacco use who presents for follow up of chronic systolic heart failure.     Patient has had extensive history of atherosclerotic disease.  She is had CABG in 2021, multiple PCI including PCI to a vein graft to the RCA he also underwent aortobifem bypass in 2014, left carotid to subclavian bypass in 2014, right femoral to popliteal bypass and right femoral endarterectomy, angioplasty to the right CFA, last in 2024, and left femoral endarterectomy in 2024.  Multiple admissions for acute decompensated heart failure. Underwent atrial fibrillation ablation 11/2024.      SUBJECTIVE:  Torsemide  increased to 80mg  BID for three days post discharge.  PMH, current medications, allergies, social history, and family history reviewed in epic.  PHYSICAL EXAM: There were no vitals filed for this visit.   GENERAL: Chronically ill-appearing PULM:  Normal work of breathing, clear to auscultation bilaterally. Respirations are unlabored.  CARDIAC:  JVP: mildly elevated         Tachycardic with irregular rhythm, systolic murmur, 1+ LE edema. Warm and well perfused extremities. ABDOMEN: Soft, non-tender, mildly distended NEUROLOGIC: Patient is oriented x3 with no focal or lateralizing neurologic deficits.    DATA REVIEW  ECG: 03/13/24: Aflutter with variable block    ECHO: TTE 06/21/23: EF 35-40, Gr 2 DD, NL RVSF, mil dLAE, mild MR, mild AI, AV sclerosis, RAP 8, ant-apical AK  TTE 03/02/24: LVEF 25-30%, reigonal WMA, moderately reduced RV function 07/2024:LVEF 45%, grade II DD, low normal RV, mildly thickened aortic valve   CATH: LHC 03/14/2023 ([facility]): LAD proximal/mid stent patent with 50 ISR, LIMA-LAD  atretic, D1 jailed, SVG-D1 patent, LCx distal 100, RCA mid tandem 90, 80, SVG-distal RCA stent with 90 ISR      ASSESSMENT & PLAN:  Chronic systolic heart failure: Ischemic related with recent worsening in the setting of atrial arrhythmia. Multiple admissions for similar complaints. Currently NYHA *** and ***volemic. - Patient not a candidate for advanced therapies given severe PAD - Continue torsemide  40 mg twice daily - Recent labs from admission reviewed - Continue spirionolactone 25mg  daily - Continue entresto  24/26mg  BID - DONE  Atrial flutter: Recurrent, s/p atrial flutter ablation with Dr. Kennyth, eventual fibrillation ablation 11/2024. Loop recorder in place.  - Off amiodarone  after ablation -Recent thyroid labs and LFTs reviewed - Pulmonary appointment for sleep apnea given that he does not qualify for home sleep study by insurance. Also likely has element of COPD - Continue eliquis  5mg  BID  CAD: Currently on triple therapy, extremely high risk but no recent interventions.  Had still been taking aspirin .  Will discontinue today. - Stop aspirin  - Continue plavix , apixaban  - Continue atorvastatin , repatha , LDL at goal  PAD: Extensive history, carotid as well as femoral disease.  - Not a candidate for advanced therapies - Continue eliquis , plavix , aggressive risk factor modification - Smoking cessation encouraged  Follow up in 3 months  Morene Brownie, MD Advanced Heart Failure Mechanical Circulatory Support 11/17/2024

## 2024-11-17 NOTE — Telephone Encounter (Signed)
 FYI Only or Action Required?: Action required by provider: O2 prescription Request.  Patient is followed in Pulmonology for Acute on chronic respiratory failure with hypoxemia , last seen on 11/10/2024 by Malachy Comer GAILS, NP.  Called Nurse Triage reporting New Med Request.    Triage Disposition: Call PCP Now  Patient/caregiver understands and will follow disposition?: Yes      Reason for Disposition  [1] Caller requests to speak ONLY to PCP AND [2] URGENT question  Answer Assessment - Initial Assessment Questions 1. REASON FOR CALL or QUESTION: What is your reason for calling today? or How can I best    Patients daughter called into triage to report patients oxygen tank is out of oxygen. She stated the O2 was delivered previously delivered through [facility]. She spoke with them today and was told they cannot deliver the tanks any longer. She stated she needs a new prescription written and sent to [place] as promised during last office visit. They have not received the prescription yet. She stated the prescription can also be sent to [facility], whichever one is fine.  Please advise.  Protocols used: PCP Call - No Triage-A-AH

## 2024-11-17 NOTE — ED Notes (Signed)
 Informed by NT that pt informed her that he was leaving d/t long wait times.

## 2024-11-17 NOTE — Telephone Encounter (Signed)
 Copied from CRM [id number]. Topic: Clinical - Prescription Issue >> Nov 17, 2024 11:41 AM Corean SAUNDERS wrote: Reason for CRM: Patients daughter Judeth) states the patient does not have any oxygen, and has been trying to resolve this matter since last week as she's been advised by the clinic that the orders have been sent multiple times but [place] they are not receiving it.   Mason would like to speak to a nurse about having an on call provider send this order to the fax number provided by pharmacy,  [facility] FAX # [phone]

## 2024-11-17 NOTE — ED Notes (Addendum)
 Pt wears 2L 02 per Duck at baseline. Pt's abdomen is distended and taut. Pt has 2+ lower extremity bilatx4d.

## 2024-11-17 NOTE — ED Triage Notes (Signed)
 Pt states he is increasingly SOB since dced from recent hospitalization, had cardiac ablation here 4 days ago. Pt said is he taking Lasix  8 a day but he is still fluid overloaded so doctor to him to come here.

## 2024-11-17 NOTE — ED Provider Triage Note (Signed)
 Emergency Medicine Provider Triage Evaluation Note  Ryan [name] , a 58 y.o. male  was evaluated in triage.  Pt complains of this of breath, abdominal distention, bilateral lower extremity edema.  Reports this has been getting worse over the last 4 days.  Reports he has been taking his Lasix  at home and reports he takes 8 pills of 20 mg/day as Dr. Maryann.  Denies any fever. Reports 20lb weight gain.   Review of Systems  Positive: SOB Negative: CP  Physical Exam  BP 135/72 (BP Location: Left Arm)   Pulse 79   Temp 97.7 F (36.5 C) (Oral)   Resp 19   SpO2 100%  Gen:   Awake, no distress   Resp:  Normal effort  MSK:   Moves extremities without difficulty  Other:  SOB  Medical Decision Making  Medically screening exam initiated at 2:42 PM.  Appropriate orders placed.  Ryan [name] was informed that the remainder of the evaluation will be completed by another provider, this initial triage assessment does not replace that evaluation, and the importance of remaining in the ED until their evaluation is complete.     Ryan [name] SAILOR, PA-C 11/17/24 1443

## 2024-11-17 NOTE — Telephone Encounter (Signed)
 Pt called and reported being very swollen and not able to breath when lying down. Pt recently had torsemide  increase and that has not helped per pt. Pt was advised to go to ER to get assessed.

## 2024-11-17 NOTE — Telephone Encounter (Signed)
 Duplicate

## 2024-11-17 NOTE — Telephone Encounter (Signed)
 I printed the order for 2 and Ryan [name] has faxed this to [place] to let her know and there was no answer- LMTCB

## 2024-11-18 ENCOUNTER — Telehealth (HOSPITAL_COMMUNITY): Payer: Self-pay

## 2024-11-18 ENCOUNTER — Telehealth (HOSPITAL_COMMUNITY): Payer: Self-pay | Admitting: *Deleted

## 2024-11-18 NOTE — Telephone Encounter (Signed)
 Copied from CRM #[id number]. Topic: General - Other >> Nov 17, 2024  1:34 PM Devaughn RAMAN wrote: Reason for CRM: Zea with [facility] was calling in regards to needing pt insurance and Date of birth. She stated the pt was referred by Izetta Rouleau and the order was entered by Wilford Fireman but it was missing the DOB and pt insurance info. Please f/u with her at [phone] regarding this.   Routing to [facility] as I believe this is in regards to the referral sent.

## 2024-11-18 NOTE — Telephone Encounter (Signed)
 Called to confirm/remind patient of their appointment at the [facility] on 11/19/24.   Appointment:   [] Confirmed  [x] Left mess   [] No answer/No voice mail  [] VM Full/unable to leave message  [] Phone not in service  And  to bring in all medications and/or complete list.

## 2024-11-18 NOTE — Telephone Encounter (Signed)
 Spoke with Zea at [facility]-she states she has spoken with the patient's daughter and received the insurance information and DOB

## 2024-11-18 NOTE — Telephone Encounter (Signed)
 Received mess from afib clinic, they spoke w/pt yesterday and advised he report to ER for worsening SOB/edema, however pt left AMA due to wait times, pt was also sch to see Dr Zenaida yesterday and cancelled b/c he was going to ER.  Attempted to call pt to discuss symptoms and resch appt, Left message to call back

## 2024-11-19 ENCOUNTER — Ambulatory Visit (HOSPITAL_COMMUNITY): Admission: RE | Admit: 2024-11-19 | Discharge: 2024-11-19 | Attending: Family Medicine

## 2024-11-19 ENCOUNTER — Encounter (HOSPITAL_COMMUNITY): Payer: Self-pay

## 2024-11-19 VITALS — BP 126/68 | HR 88 | Wt 216.2 lb

## 2024-11-19 DIAGNOSIS — I4891 Unspecified atrial fibrillation: Secondary | ICD-10-CM | POA: Diagnosis not present

## 2024-11-19 DIAGNOSIS — I2510 Atherosclerotic heart disease of native coronary artery without angina pectoris: Secondary | ICD-10-CM | POA: Diagnosis not present

## 2024-11-19 DIAGNOSIS — R002 Palpitations: Secondary | ICD-10-CM | POA: Insufficient documentation

## 2024-11-19 DIAGNOSIS — I483 Typical atrial flutter: Secondary | ICD-10-CM | POA: Diagnosis not present

## 2024-11-19 DIAGNOSIS — Z9981 Dependence on supplemental oxygen: Secondary | ICD-10-CM | POA: Diagnosis not present

## 2024-11-19 DIAGNOSIS — I739 Peripheral vascular disease, unspecified: Secondary | ICD-10-CM | POA: Diagnosis not present

## 2024-11-19 DIAGNOSIS — Z79899 Other long term (current) drug therapy: Secondary | ICD-10-CM | POA: Insufficient documentation

## 2024-11-19 DIAGNOSIS — Z7902 Long term (current) use of antithrombotics/antiplatelets: Secondary | ICD-10-CM | POA: Insufficient documentation

## 2024-11-19 DIAGNOSIS — I4892 Unspecified atrial flutter: Secondary | ICD-10-CM | POA: Diagnosis not present

## 2024-11-19 DIAGNOSIS — E118 Type 2 diabetes mellitus with unspecified complications: Secondary | ICD-10-CM | POA: Diagnosis not present

## 2024-11-19 DIAGNOSIS — E785 Hyperlipidemia, unspecified: Secondary | ICD-10-CM | POA: Diagnosis not present

## 2024-11-19 DIAGNOSIS — Z955 Presence of coronary angioplasty implant and graft: Secondary | ICD-10-CM | POA: Insufficient documentation

## 2024-11-19 DIAGNOSIS — I779 Disorder of arteries and arterioles, unspecified: Secondary | ICD-10-CM | POA: Diagnosis not present

## 2024-11-19 DIAGNOSIS — Z794 Long term (current) use of insulin: Secondary | ICD-10-CM | POA: Diagnosis not present

## 2024-11-19 DIAGNOSIS — D509 Iron deficiency anemia, unspecified: Secondary | ICD-10-CM | POA: Insufficient documentation

## 2024-11-19 DIAGNOSIS — F17210 Nicotine dependence, cigarettes, uncomplicated: Secondary | ICD-10-CM | POA: Insufficient documentation

## 2024-11-19 DIAGNOSIS — E1165 Type 2 diabetes mellitus with hyperglycemia: Secondary | ICD-10-CM | POA: Diagnosis not present

## 2024-11-19 DIAGNOSIS — Z7901 Long term (current) use of anticoagulants: Secondary | ICD-10-CM | POA: Insufficient documentation

## 2024-11-19 DIAGNOSIS — E1151 Type 2 diabetes mellitus with diabetic peripheral angiopathy without gangrene: Secondary | ICD-10-CM | POA: Diagnosis not present

## 2024-11-19 DIAGNOSIS — I5023 Acute on chronic systolic (congestive) heart failure: Secondary | ICD-10-CM | POA: Insufficient documentation

## 2024-11-19 DIAGNOSIS — Z951 Presence of aortocoronary bypass graft: Secondary | ICD-10-CM | POA: Diagnosis not present

## 2024-11-19 NOTE — Addendum Note (Signed)
 Encounter addended by: Tika Hannis M, RN on: 11/19/2024 3:17 PM  Actions taken: Clinical Note Signed

## 2024-11-19 NOTE — Addendum Note (Signed)
 Encounter addended by: Trew Sunde M, RN on: 11/19/2024 3:26 PM  Actions taken: Clinical Note Signed

## 2024-11-19 NOTE — Progress Notes (Signed)
 ADVANCED HEART FAILURE FOLLOW UP CLINIC NOTE  Referring Physician: [name], MD  Primary Care: [name], MD Primary Cardiologist: Dr. Zenaida  HPI: Ryan [name] is a 58 y.o. male with a PMH of CAD with multiple PCI and CABG, PAD, HLD, LV apical thrombus, carotid artery disease, tobacco use who presents for follow up of recent hospitalization for cardiogenic shock and afib.     Patient has had extensive history of atherosclerotic disease.  She is had CABG in 2021, multiple PCI including PCI to a vein graft to the RCA he also underwent aortobifem bypass in 2014, left carotid to subclavian bypass in 2014, right femoral to popliteal bypass and right femoral endarterectomy, angioplasty to the right CFA, last in 2024, and left femoral endarterectomy in 2024.  Admitted 8/25 with a/c HF with massive volume overload. Diuresed with IV lasix , however threatened to leave AMA 2x and was discharged before fully diuresed.   S/p AF ablation 11/12/24. Volume overloaded and instructed to increase torsemide  to 80 bid x 3 days, then back to 40 bid.  Seen in ED 11/17/24 with volume overload. LWBS due to wait times.    SUBJECTIVE: Today he returns for HF follow up with friend. Overall feeling fair. Breathing worse recently, he is SOB walking short distances on flat ground. Wears 2L oxygen. Feels palpitations. Denies abnormal bleeding, CP, dizziness, edema, or PND/Orthopnea. Appetite ok. Weight at home 212 pounds. Taking all medications, has not had metolazone  recently. Smokes 1/2 ppd. Lives with daughter who is a [employer] and manages his medications.  PMH, current medications, allergies, social history, and family history reviewed in epic.  Wt Readings from Last 3 Encounters:  11/19/24 98.1 kg (216 lb 3.2 oz)  11/12/24 94.6 kg (208 lb 8 oz)  11/10/24 95 kg (209 lb 6.4 oz)   BP 126/68   Pulse 88   Wt 98.1 kg (216 lb 3.2 oz)   SpO2 90%   BMI 31.93 kg/m   PHYSICAL EXAM: General:  NAD. No resp  difficulty, walked into clinic on 2L oxygen HEENT: Normal Neck: Supple. JVP to ear Cor: Regular rate & rhythm. No rubs, gallops or murmurs. Lungs: Crackles RLL Abdomen: Distended, obese.  Extremities: No cyanosis, clubbing, rash, 2-3+ BLE pre-tibial edema Neuro: Alert & oriented x 3, moves all 4 extremities w/o difficulty. Affect pleasant.   DATA REVIEW  ECG: 03/13/24: Aflutter with variable block   11/18/24: NSR 79 bpm (personally reviewed from ED visit)  ECHO: - 06/21/23: EF 35-40, Gr 2 DD, NL RVSF, mil dLAE, mild MR, mild AI, AV sclerosis, RAP 8, ant-apical AK  - 03/02/24: LVEF 25-30%, reigonal WMA, moderately reduced RV function  - 8/25: EF 45%, Akinesis of distal lateral and apical walls. LV with GHK, GIIDD, RV low normal, LA mildly dilated, trivial MR  CATH: - 03/14/2023 ([facility]): LAD proximal/mid stent patent with 50 ISR, LIMA-LAD atretic, D1 jailed, SVG-D1 patent, LCx distal 100, RCA mid tandem 90, 80, SVG-distal RCA stent with 90 ISR    ASSESSMENT & PLAN: Acute on Chronic Systolic Heart failure: Ischemic related with recent worsening in the setting of atrial flutter.  - Echo 8/25: EF 45%, Akinesis of distal lateral and apical walls. LV with GHK, GIIDD, RV low normal, LA mildly dilated, trivial MR - S/p flutter ablation, though having rare episodes of fib.   - NYHA III-IIIb, he is volume overloaded on exam, weight up. - Use Furoscix  daily + 40 KCL daily x 3 days, hold torsemide  while using  Furoscix  - After 3 days, resume torsemide  40 mg bid + 40 KCL daily. - Stop digoxin  with resumption of NSR and improved EF - Continue spironolactone  25 mg daily. - No SGLT2i with A1C of 12.3 - Add back Entresto  vs losartan next visit - Patient not a candidate for advanced therapies given severe PAD - Labs reviewed from ED 11/17/24, K 3.4, SCr 1.31  AFL/AFib: Recurrent, s/p atrial flutter ablation with Dr. Kennyth 6/25. Loop recorder implanted to evaluate for atrial fibrillation. -  s/p AF ablation 12/25 - Now off amiodarone  - Continue Eliquis  5 mg bid - Pulmonary appointment for sleep apnea given that he does not qualify for home sleep study by insurance. Also likely has element of COPD. (Scheduled for 07/31/24)  CAD: No chest pain - Continue Plavix  & apixaban  - Continue atorvastatin , repatha , LDL at goal  PAD: Extensive history, carotid as well as femoral disease.  - Not a candidate for advanced therapies - Continue eliquis , plavix , aggressive risk factor modification - Smoking cessation encouraged  IDA:  - Noted Hgb down trending during last admission - TSat 7% 8/25, has received IV iron - Re-check iron panel next visit, last hgb 9.9 - CBC today, may need referral to GI if it continues to downtrend  DM: - Uncontrolled.  - Last A1c 12.5 8/25 - He is on insulin  - Off SGLT2i for now  Follow up in 1 week with APP (will need EKG, BMET, Mag, CBC and iron panel). He is high risk for re-admission.  I spoke with Zakkary's daughter via phone during visit and discussed plan of care. She is agreeable and able to help with Furoscix .  [name] [facility] FNP-BC Advanced Heart Failure 11/19/2024

## 2024-11-19 NOTE — Progress Notes (Signed)
 Medication Samples have been provided to the patient.  Drug name: furoscix        Strength: 80 mg        Qty: 2  LOT: [id number]  Exp.Date: 04/02/2026  Dosing instructions: as directed by heart failure clinic  The patient has been instructed regarding the correct time, dose, and frequency of taking this medication, including desired effects and most common side effects.   Ryan [name] Ryan [name] 3:15 PM 11/19/2024

## 2024-11-19 NOTE — Patient Instructions (Addendum)
 Good to see you today!   STOP Digoxin   Your provider has order Furoscix  for you. This is an on-body infuser that gives you a dose of Furosemide .   It will be shipped to your home from [facility], they will call you before shipping  Ensure you write down the time you start your infusion so that if there is a problem you will know how long the infusion lasted  Use Furoscix  only AS DIRECTED by our office  Dosing Directions:   Day 1= Wednesday 1 furoscix  kit with additional 40 meq potassium  Day 2= Thursday 1 furoscix  kit with additional 40 meq potassium  Day 3= Friday 1 furoscix  kit with additional 40 meq potassium  HOLD torsemide  dose while using furoscix   On Saturday start back on torsemide  40 mg and 40 meq potassium daily(Home dose)  Your physician recommends that you schedule a follow-up appointment as scheduled.,   If you have any questions or concerns before your next appointment please send us  a message through [place] or call our office at [phone].    TO LEAVE A MESSAGE FOR THE NURSE SELECT OPTION 2, PLEASE LEAVE A MESSAGE INCLUDING: YOUR NAME DATE OF BIRTH CALL BACK NUMBER REASON FOR CALL**this is important as we prioritize the call backs  YOU WILL RECEIVE A CALL BACK THE SAME DAY AS LONG AS YOU CALL BEFORE 4:00 PM At the Advanced Heart Failure Clinic, you and your health needs are our priority. As part of our continuing mission to provide you with exceptional heart care, we have created designated Provider Care Teams. These Care Teams include your primary Cardiologist (physician) and Advanced Practice Providers (APPs- Physician Assistants and Nurse Practitioners) who all work together to provide you with the care you need, when you need it.   You may see any of the following providers on your designated Care Team at your next follow up: Dr Toribio Fuel Dr Ezra Shuck Dr. Morene Brownie Greig Mosses, NP Caffie Shed, [place] [facility], [place] Beckey Coe, NP Jordan Lee, NP Ellouise Class, NP Tinnie Redman, PharmD Jaun Bash, PharmD   Please be sure to bring in all your medications bottles to every appointment.    Thank you for choosing [facility]

## 2024-11-19 NOTE — Progress Notes (Signed)
 Provided patient education on Furoscix using demo kits and Furoscix video, QR code provided on AVS for further viewing.

## 2024-11-20 ENCOUNTER — Other Ambulatory Visit: Payer: Self-pay

## 2024-11-20 ENCOUNTER — Encounter

## 2024-11-20 ENCOUNTER — Telehealth (HOSPITAL_COMMUNITY): Payer: Self-pay

## 2024-11-20 ENCOUNTER — Other Ambulatory Visit (HOSPITAL_COMMUNITY): Payer: Self-pay

## 2024-11-20 ENCOUNTER — Encounter (HOSPITAL_COMMUNITY): Payer: Self-pay

## 2024-11-20 DIAGNOSIS — J9611 Chronic respiratory failure with hypoxia: Secondary | ICD-10-CM

## 2024-11-20 MED ORDER — FUROSCIX 80 MG/10ML ~~LOC~~ CTKT
80.0000 mg | CARTRIDGE | SUBCUTANEOUS | 0 refills | Status: DC
  Filled 2024-11-20: qty 5, 5d supply, fill #0

## 2024-11-20 NOTE — Telephone Encounter (Signed)
 Spoke to patient regarding cost of furoscix  medication. It is $4.Patient is able to pick up medication tomorrow at the pharmacy. Patient only started taking furoscix  this am.

## 2024-11-20 NOTE — Progress Notes (Signed)
 Specialty Pharmacy Initial Fill Coordination Note  Arthor Gorter is a 58 y.o. male contacted today regarding initial fill of specialty medication(s) Furosemide  (Furoscix )   Patient requested [facility] at [facility] at [place] date: 11/20/24   Medication will be filled on: 11/20/24    Patient is aware of $4 copayment.

## 2024-11-21 NOTE — Telephone Encounter (Signed)
 ATC x2.  No answer.  LVM for patient to return call.

## 2024-11-28 ENCOUNTER — Ambulatory Visit (HOSPITAL_COMMUNITY): Payer: Self-pay | Admitting: Adult Health

## 2024-11-28 ENCOUNTER — Ambulatory Visit
Admission: RE | Admit: 2024-11-28 | Discharge: 2024-11-28 | Disposition: A | Discharge status: Home / Self Care | Source: Ambulatory Visit | Attending: Adult Health | Admitting: Adult Health

## 2024-11-28 ENCOUNTER — Ambulatory Visit

## 2024-11-28 ENCOUNTER — Other Ambulatory Visit (HOSPITAL_COMMUNITY): Payer: Self-pay

## 2024-11-28 VITALS — BP 122/74 | HR 78 | Wt 220.0 lb

## 2024-11-28 DIAGNOSIS — Z9981 Dependence on supplemental oxygen: Secondary | ICD-10-CM | POA: Insufficient documentation

## 2024-11-28 DIAGNOSIS — I5042 Chronic combined systolic (congestive) and diastolic (congestive) heart failure: Secondary | ICD-10-CM | POA: Diagnosis not present

## 2024-11-28 DIAGNOSIS — Z955 Presence of coronary angioplasty implant and graft: Secondary | ICD-10-CM | POA: Diagnosis not present

## 2024-11-28 DIAGNOSIS — J9611 Chronic respiratory failure with hypoxia: Secondary | ICD-10-CM | POA: Diagnosis not present

## 2024-11-28 DIAGNOSIS — I5020 Unspecified systolic (congestive) heart failure: Secondary | ICD-10-CM

## 2024-11-28 DIAGNOSIS — Z7901 Long term (current) use of anticoagulants: Secondary | ICD-10-CM | POA: Insufficient documentation

## 2024-11-28 DIAGNOSIS — E1165 Type 2 diabetes mellitus with hyperglycemia: Secondary | ICD-10-CM | POA: Diagnosis not present

## 2024-11-28 DIAGNOSIS — I2510 Atherosclerotic heart disease of native coronary artery without angina pectoris: Secondary | ICD-10-CM | POA: Insufficient documentation

## 2024-11-28 DIAGNOSIS — D509 Iron deficiency anemia, unspecified: Secondary | ICD-10-CM | POA: Diagnosis not present

## 2024-11-28 DIAGNOSIS — Z79899 Other long term (current) drug therapy: Secondary | ICD-10-CM | POA: Diagnosis not present

## 2024-11-28 DIAGNOSIS — I4892 Unspecified atrial flutter: Secondary | ICD-10-CM | POA: Insufficient documentation

## 2024-11-28 DIAGNOSIS — E785 Hyperlipidemia, unspecified: Secondary | ICD-10-CM | POA: Diagnosis not present

## 2024-11-28 DIAGNOSIS — I5022 Chronic systolic (congestive) heart failure: Secondary | ICD-10-CM | POA: Diagnosis not present

## 2024-11-28 DIAGNOSIS — I739 Peripheral vascular disease, unspecified: Secondary | ICD-10-CM | POA: Diagnosis not present

## 2024-11-28 DIAGNOSIS — I513 Intracardiac thrombosis, not elsewhere classified: Secondary | ICD-10-CM | POA: Diagnosis not present

## 2024-11-28 DIAGNOSIS — Z951 Presence of aortocoronary bypass graft: Secondary | ICD-10-CM | POA: Diagnosis not present

## 2024-11-28 DIAGNOSIS — Z7902 Long term (current) use of antithrombotics/antiplatelets: Secondary | ICD-10-CM | POA: Diagnosis not present

## 2024-11-28 DIAGNOSIS — J9612 Chronic respiratory failure with hypercapnia: Secondary | ICD-10-CM | POA: Diagnosis not present

## 2024-11-28 DIAGNOSIS — Z95820 Peripheral vascular angioplasty status with implants and grafts: Secondary | ICD-10-CM | POA: Diagnosis not present

## 2024-11-28 LAB — BASIC METABOLIC PANEL WITH GFR
Anion gap: 11 (ref 5–15)
BUN: 21 mg/dL — ABNORMAL HIGH (ref 6–20)
CO2: 25 mmol/L (ref 22–32)
Calcium: 8.9 mg/dL (ref 8.9–10.3)
Chloride: 95 mmol/L — ABNORMAL LOW (ref 98–111)
Creatinine, Ser: 1.29 mg/dL — ABNORMAL HIGH (ref 0.61–1.24)
GFR, Estimated: >60 mL/min
Glucose, Bld: 252 mg/dL — ABNORMAL HIGH (ref 70–99)
Potassium: 4.2 mmol/L (ref 3.5–5.1)
Sodium: 130 mmol/L — ABNORMAL LOW (ref 135–145)

## 2024-11-28 MED ORDER — POTASSIUM CHLORIDE CRYS ER 20 MEQ PO TBCR
20.0000 meq | EXTENDED_RELEASE_TABLET | Freq: Two times a day (BID) | ORAL | 3 refills | Status: DC
  Filled 2024-11-28: qty 70, 30d supply, fill #0

## 2024-11-28 MED ORDER — METOLAZONE 5 MG PO TABS
5.0000 mg | ORAL_TABLET | ORAL | 0 refills | Status: DC
  Filled 2024-11-28: qty 5, 5d supply, fill #0

## 2024-11-28 MED ORDER — TORSEMIDE 20 MG PO TABS
80.0000 mg | ORAL_TABLET | Freq: Two times a day (BID) | ORAL | 3 refills | Status: DC
  Filled 2024-11-28: qty 240, 30d supply, fill #0

## 2024-11-28 NOTE — Patient Instructions (Addendum)
 Medication Changes:  INCREASE Torsemide  to 80 mg (4 tabs) Twice daily   Take Metolazone  5 mg daily Fri 12/26, Sat 12/27, and Sun 12/28  Take extra Potassium 40 meq (2 tabs) with Metolazone  on Fri, Sat, and Sunday  Lab Work:  Labs done today, your results will be available in MyChart, we will contact you for abnormal readings.   Special Instructions // Education:  Do the following things EVERYDAY: Weigh yourself in the morning before breakfast. Write it down and keep it in a log. Take your medicines as prescribed Eat low salt foods--Limit salt (sodium) to 2000 mg per day.  Stay as active as you can everyday Limit all fluids for the day to less than 2 liters   Follow-Up in: Monday 12/29 at 11 am   At the Advanced Heart Failure Clinic, you and your health needs are our priority. We have a designated team specialized in the treatment of Heart Failure. This Care Team includes your primary Heart Failure Specialized Cardiologist (physician), Advanced Practice Providers (APPs- Physician Assistants and Nurse Practitioners), and Pharmacist who all work together to provide you with the care you need, when you need it.   You may see any of the following providers on your designated Care Team at your next follow up:  Dr. Toribio Fuel Dr. Ezra Shuck Dr. Odis Brownie Greig Mosses, NP Caffie Shed, [place] [facility], [place] Beckey Coe, NP Jordan Lee, NP Tinnie Redman, PharmD   Please be sure to bring in all your medications bottles to every appointment.   Need to Contact Us :  If you have any questions or concerns before your next appointment please send us  a message through [place] or call our office at [phone].    TO LEAVE A MESSAGE FOR THE NURSE SELECT OPTION 2, PLEASE LEAVE A MESSAGE INCLUDING: YOUR NAME DATE OF BIRTH CALL BACK NUMBER REASON FOR CALL**this is important as we prioritize the call backs  YOU WILL RECEIVE A CALL BACK THE SAME DAY AS LONG AS  YOU CALL BEFORE 4:00 PM

## 2024-11-28 NOTE — Progress Notes (Signed)
 Pt states he is almost out of home O2 and has not been able to get new supplies.   Per chart at OV 11/10/24 with Comer Rouleau, NP:   Walk test today with desaturations on room air; resolved with 3 lpm supplemental O2 continuous O2. Able to maintain saturations 4 lpm POC. Goal >88-90%. Orders placed to transfer DME companies and add humidity to O2.  Patient Instructions  Continue Albuterol  inhaler 2 puffs every 6 hours as needed for shortness of breath or wheezing. Notify if symptoms persist despite rescue inhaler/neb use.  Continue Stiolto 2 puffs daily  Continue your torsemide  as prescribed. Take an extra dose as directed by your heart doctor for increased swelling or weight gain of 2-3 lb overnight, or 5 lb in a week  Continue supplemental oxygen 2 lpm at night and 3 lpm continuous or 4 lpm POC during the day. Goal >88-90%  I have sent orders to change your medical supply company to [employer] and provide humidity    I called and spoke w/[facility], they state they did receive O2 orders from pulm and need the order form completed and signed, it was faxed to Comer Rouleau, NP and they are waiting to get it back. Staff message sent to Comer Rouleau, NP and [facility] pulm staff to address, pt is aware  Powell Latino, RN, BSN, [facility] Specialty Coordinator Advanced Heart Failure Clinic

## 2024-11-28 NOTE — Progress Notes (Signed)
 "  ADVANCED HEART FAILURE FOLLOW UP CLINIC NOTE  Referring Physician: [name], MD  Primary Care: [name], MD Primary Cardiologist: Dr. Zenaida  HPI: Ryan [name] is a 58 y.o. male with a PMH of CAD with multiple PCI and CABG, PAD, HLD, LV apical thrombus, carotid artery disease, tobacco use who presents for follow up of recent hospitalization for cardiogenic shock and afib.     Patient has had extensive history of atherosclerotic disease.  She is had CABG in 2021, multiple PCI including PCI to a vein graft to the RCA he also underwent aortobifem bypass in 2014, left carotid to subclavian bypass in 2014, right femoral to popliteal bypass and right femoral endarterectomy, angioplasty to the right CFA, last in 2024, and left femoral endarterectomy in 2024.  Admitted 8/25 with a/c HF with massive volume overload. Diuresed with IV lasix , however threatened to leave AMA 2x and was discharged before fully diuresed.   S/P AF ablation 11/12/24. Volume overloaded and instructed to increase torsemide  to 80 bid x 3 days, then back to 40 bid.  Seen in ED 11/17/24 with volume overload. He left due to long weight time.  Creatinine 1.33   11/19/24- Volume overloaded. He was prescribed fursocix given for 3 days ut he 5 days then back to torsemide  40 mg twice a day.  He reports poor urine output. Weight at that time 212 pounds.    SUBJECTIVE: Today he returns for HF follow up with his ex-wife.Complaining of fatigue and dizziness. Running out of his oxygen. Has been trying to get concentrator. Gets SOB with exertion. Denies PND/Orthopnea. Drinks lots of water and eats ice. Appetite ok. No fever or chills. Weight at home 212-216 pounds. Taking all medications. His daughter has been helping him with his medications.   PMH, current medications, allergies, social history, and family history reviewed in epic.  Wt Readings from Last 3 Encounters:  11/28/24 99.8 kg (220 lb)  11/19/24 98.1 kg (216 lb  3.2 oz)  11/12/24 94.6 kg (208 lb 8 oz)   BP 122/74   Pulse 78   Wt 99.8 kg (220 lb)   SpO2 96% Comment: 2L O2  BMI 32.49 kg/m   PHYSICAL EXAM: General:   No resp difficulty. Arrived  in a wheelchair.  Neck: to jaw  Cor: Regular rate & rhythm.  Lungs: Crackles in the bases on 3 liters [place] Abdomen: soft, nontender, distended.  Extremities: R and LLE 2+  edema Neuro: alert & oriented x3 ReDs reading: 51 %, abnormal     DATA REVIEW ECG: 03/13/24: Aflutter with variable block   11/18/24: NSR 79 bpm (personally reviewed from ED visit)  ECHO: - 06/21/23: EF 35-40, Gr 2 DD, NL RVSF, mil dLAE, mild MR, mild AI, AV sclerosis, RAP 8, ant-apical AK  - 03/02/24: LVEF 25-30%, reigonal WMA, moderately reduced RV function  - 8/25: EF 45%, Akinesis of distal lateral and apical walls. LV with GHK, GIIDD, RV low normal, LA mildly dilated, trivial MR  CATH: - 03/14/2023 ([facility]): LAD proximal/mid stent patent with 50 ISR, LIMA-LAD atretic, D1 jailed, SVG-D1 patent, LCx distal 100, RCA mid tandem 90, 80, SVG-distal RCA stent with 90 ISR    ASSESSMENT & PLAN:  Chronic Systolic Heart failure: Ischemic related with recent worsening in the setting of atrial flutter.  - Echo 8/25: EF 45%, Akinesis of distal lateral and apical walls. LV with GHK, GIIDD, RV low normal, LA mildly dilated, trivial MR - S/p flutter ablation, though having  rare episodes of fib.   - NYHA III confounded by emphysema.  Reds Reading 51%. On exam he is volume overloaded.  He has had 5 Furoscix  Kits with over the last week. His last dow of Furoscix  11/27/24. Reports poor response. -Will try to double torsemide  and try 5 mg metolazone  for the next 3 days. He will also need to take another 40 meq of potassium for the next 3 days.  - Continue spironolactone  25 mg daily. - No SGLT2i with A1C of 12.3 - Hold on ARB for now.  - Patient not a candidate for advanced therapies given severe PAD - Check BMET and BNP today.  -  Discussed limiting fluid intake - Follow up next week. If no improvement will need RHC to further assess and possible hospital admit.   AFL/AFib: Recurrent, s/p atrial flutter ablation with Dr. Kennyth 6/25. Loop recorder implanted to evaluate for atrial fibrillation. - s/p AF ablation 12/25 - Now off amiodarone  - Continue Eliquis  5 mg bid - Pulmonary appointment for sleep apnea given that he does not qualify for home sleep study by insurance. Also likely has element of COPD. (Scheduled for 07/31/24)  CAD:  - No chest pain.  - Continue Plavix  & apixaban  - Continue atorvastatin , repatha , LDL at goal  PAD: Extensive history, carotid as well as femoral disease.  - Not a candidate for advanced therapies - Continue eliquis , plavix , aggressive risk factor modification - Smoking cessation encouraged  IDA:  - Noted Hgb down trending during last admission - TSat 7% 8/25, has received IV iron - Re-check iron panel next visit, last hgb 9.9 - Check CBC next week.   DM: - Uncontrolled.  - Last A1c 12.5 8/25 - He is on insulin  - Off SGLT2i  Chronic Hypoxic Respiratory Failure  Discharged on home oxygen 07/2024. He is running out of oxygen and needs concentrator + additional tanks.  Our office contacted the Pulmonary Office and [employer].    Follow up Monday to reassess volume status and check BMET/CBC.  I spent 40 minutes reviewing records, interviewing/examining patient, and managing orders.     Adolph Clutter NP-C  Advanced Heart Failure 11/28/2024 "

## 2024-11-28 NOTE — Progress Notes (Signed)
"   ReDS Vest / Clip - 11/28/24 1100       ReDS Vest / Clip   Station Marker C    Ruler Value 34    ReDS Value Range High volume overload    ReDS Actual Value 51          "

## 2024-12-01 ENCOUNTER — Other Ambulatory Visit (HOSPITAL_COMMUNITY): Payer: Self-pay

## 2024-12-01 ENCOUNTER — Telehealth: Payer: Self-pay

## 2024-12-01 ENCOUNTER — Ambulatory Visit (HOSPITAL_COMMUNITY)
Admission: RE | Admit: 2024-12-01 | Discharge: 2024-12-01 | Disposition: A | Discharge status: Home / Self Care | Source: Ambulatory Visit | Attending: Family Medicine | Admitting: Family Medicine

## 2024-12-01 ENCOUNTER — Ambulatory Visit (HOSPITAL_COMMUNITY): Payer: Self-pay | Admitting: Family Medicine

## 2024-12-01 ENCOUNTER — Telehealth (HOSPITAL_COMMUNITY): Payer: Self-pay | Admitting: Pharmacist

## 2024-12-01 ENCOUNTER — Encounter (HOSPITAL_COMMUNITY): Payer: Self-pay

## 2024-12-01 ENCOUNTER — Other Ambulatory Visit: Payer: Self-pay | Admitting: Pulmonary Disease

## 2024-12-01 ENCOUNTER — Other Ambulatory Visit (HOSPITAL_COMMUNITY): Payer: Self-pay | Admitting: Family Medicine

## 2024-12-01 VITALS — BP 104/58 | HR 93 | Wt 202.4 lb

## 2024-12-01 DIAGNOSIS — I483 Typical atrial flutter: Secondary | ICD-10-CM

## 2024-12-01 DIAGNOSIS — G4730 Sleep apnea, unspecified: Secondary | ICD-10-CM | POA: Insufficient documentation

## 2024-12-01 DIAGNOSIS — D509 Iron deficiency anemia, unspecified: Secondary | ICD-10-CM

## 2024-12-01 DIAGNOSIS — Z79899 Other long term (current) drug therapy: Secondary | ICD-10-CM | POA: Diagnosis not present

## 2024-12-01 DIAGNOSIS — E118 Type 2 diabetes mellitus with unspecified complications: Secondary | ICD-10-CM | POA: Diagnosis not present

## 2024-12-01 DIAGNOSIS — Z794 Long term (current) use of insulin: Secondary | ICD-10-CM | POA: Diagnosis not present

## 2024-12-01 DIAGNOSIS — I739 Peripheral vascular disease, unspecified: Secondary | ICD-10-CM | POA: Diagnosis not present

## 2024-12-01 DIAGNOSIS — E785 Hyperlipidemia, unspecified: Secondary | ICD-10-CM | POA: Diagnosis not present

## 2024-12-01 DIAGNOSIS — Z955 Presence of coronary angioplasty implant and graft: Secondary | ICD-10-CM | POA: Insufficient documentation

## 2024-12-01 DIAGNOSIS — F17210 Nicotine dependence, cigarettes, uncomplicated: Secondary | ICD-10-CM | POA: Insufficient documentation

## 2024-12-01 DIAGNOSIS — I4891 Unspecified atrial fibrillation: Secondary | ICD-10-CM | POA: Diagnosis not present

## 2024-12-01 DIAGNOSIS — I5022 Chronic systolic (congestive) heart failure: Secondary | ICD-10-CM

## 2024-12-01 DIAGNOSIS — Z7902 Long term (current) use of antithrombotics/antiplatelets: Secondary | ICD-10-CM | POA: Insufficient documentation

## 2024-12-01 DIAGNOSIS — Z7901 Long term (current) use of anticoagulants: Secondary | ICD-10-CM | POA: Insufficient documentation

## 2024-12-01 DIAGNOSIS — Z951 Presence of aortocoronary bypass graft: Secondary | ICD-10-CM | POA: Insufficient documentation

## 2024-12-01 DIAGNOSIS — E1165 Type 2 diabetes mellitus with hyperglycemia: Secondary | ICD-10-CM | POA: Insufficient documentation

## 2024-12-01 DIAGNOSIS — I2510 Atherosclerotic heart disease of native coronary artery without angina pectoris: Secondary | ICD-10-CM | POA: Diagnosis not present

## 2024-12-01 LAB — BASIC METABOLIC PANEL WITH GFR
Anion gap: 13 (ref 5–15)
BUN: 28 mg/dL — ABNORMAL HIGH (ref 6–20)
CO2: 36 mmol/L — ABNORMAL HIGH (ref 22–32)
Calcium: 9.3 mg/dL (ref 8.9–10.3)
Chloride: 84 mmol/L — ABNORMAL LOW (ref 98–111)
Creatinine, Ser: 1.61 mg/dL — ABNORMAL HIGH (ref 0.61–1.24)
GFR, Estimated: 49 mL/min — ABNORMAL LOW
Glucose, Bld: 270 mg/dL — ABNORMAL HIGH (ref 70–99)
Potassium: 2.8 mmol/L — ABNORMAL LOW (ref 3.5–5.1)
Sodium: 133 mmol/L — ABNORMAL LOW (ref 135–145)

## 2024-12-01 LAB — IRON AND TIBC
Iron: 22 ug/dL — ABNORMAL LOW (ref 45–182)
Saturation Ratios: 5 % — ABNORMAL LOW (ref 17.9–39.5)
TIBC: 455 ug/dL — ABNORMAL HIGH (ref 250–450)
UIBC: 433 ug/dL

## 2024-12-01 LAB — MAGNESIUM: Magnesium: 1.8 mg/dL (ref 1.7–2.4)

## 2024-12-01 LAB — FERRITIN: Ferritin: 101 ng/mL (ref 24–336)

## 2024-12-01 MED ORDER — POTASSIUM CHLORIDE CRYS ER 20 MEQ PO TBCR: 60.0000 meq | EXTENDED_RELEASE_TABLET | Freq: Two times a day (BID) | ORAL | 3 refills | Status: DC

## 2024-12-01 MED ORDER — METOLAZONE 5 MG PO TABS: 5.0000 mg | ORAL_TABLET | ORAL | 3 refills | Status: DC

## 2024-12-01 MED ORDER — POTASSIUM CHLORIDE CRYS ER 20 MEQ PO TBCR: 20.0000 meq | EXTENDED_RELEASE_TABLET | Freq: Two times a day (BID) | ORAL | 3 refills | Status: DC

## 2024-12-01 NOTE — Telephone Encounter (Signed)
 CRM has been signed by DOD - and  faxed to [employer]

## 2024-12-01 NOTE — Progress Notes (Signed)
"   ReDS Vest / Clip - 12/01/24 1022       ReDS Vest / Clip   Station Marker C    Ruler Value 33    ReDS Value Range High volume overload    ReDS Actual Value 56          "

## 2024-12-01 NOTE — Telephone Encounter (Signed)
" °  Spoke with [facility] they sent/ sending  CRM that just needs to be signed and faxed back everything else was already received     Copied from CRM #[id number]. Topic: General - Other >> Nov 17, 2024  1:34 PM Devaughn RAMAN wrote: Reason for CRM: Zea with [facility] was calling in regards to needing pt insurance and Date of birth. She stated the pt was referred by Izetta Rouleau and the order was entered by Wilford Fireman but it was missing the DOB and pt insurance info. Please f/u with her at [phone] regarding this. >> Nov 28, 2024 10:05 AM Rozanna MATSU wrote: Pt daughter called stated she spoke with [facility] stated they are missing the chart notes from the face to face visit. Stated pt still has no oxygen to get out and about for his appts and etc.  "

## 2024-12-01 NOTE — Patient Instructions (Signed)
 CHANGE Metolazone  to 5 mg every Monday. Take and extra 40 mEq ( 2 tabs) of Potassium with it.  Labs done today, your results will be available in MyChart, we will contact you for abnormal readings.  Your physician recommends that you schedule a follow-up appointment in: as scheduled.  If you have any questions or concerns before your next appointment please send us  a message through [place] or call our office at [phone].    TO LEAVE A MESSAGE FOR THE NURSE SELECT OPTION 2, PLEASE LEAVE A MESSAGE INCLUDING: YOUR NAME DATE OF BIRTH CALL BACK NUMBER REASON FOR CALL**this is important as we prioritize the call backs  YOU WILL RECEIVE A CALL BACK THE SAME DAY AS LONG AS YOU CALL BEFORE 4:00 PM  At the Advanced [facility] Failure Clinic, you and your health needs are our priority. As part of our continuing mission to provide you with exceptional heart care, we have created designated Provider Care Teams. These Care Teams include your primary Cardiologist (physician) and Advanced Practice Providers (APPs- Physician Assistants and Nurse Practitioners) who all work together to provide you with the care you need, when you need it.   You may see any of the following providers on your designated Care Team at your next follow up: Dr Toribio Fuel Dr Ezra Shuck Dr. Morene Brownie Greig Mosses, NP Caffie Shed, [place] [facility], [place] Beckey Coe, NP Jordan Lee, NP Ellouise Class, NP Tinnie Redman, PharmD Jaun Bash, PharmD   Please be sure to bring in all your medications bottles to every appointment.    Thank you for choosing [facility]

## 2024-12-01 NOTE — Telephone Encounter (Addendum)
 Pt aware, agreeable, and verbalized understanding  Med list updated. Labs ordered for [facility]  Iron referral sent   ----- Message from Harlene CHRISTELLA Gainer sent at 12/01/2024  2:26 PM EST ----- Hold off on any further metolazone  use. Will re-address at next visit

## 2024-12-01 NOTE — Progress Notes (Signed)
 "  ADVANCED HEART FAILURE FOLLOW UP CLINIC NOTE  Primary Care: [name], MD HF Cardiologist: Dr. Zenaida  HPI: Ryan [name] is a 58 y.o. male with a PMH of CAD with multiple PCI and CABG, PAD, HLD, LV apical thrombus, carotid artery disease, tobacco use.   Patient has had extensive history of atherosclerotic disease.  She is had CABG in 2021, multiple PCI including PCI to a vein graft to the RCA he also underwent aortobifem bypass in 2014, left carotid to subclavian bypass in 2014, right femoral to popliteal bypass and right femoral endarterectomy, angioplasty to the right CFA, last in 2024, and left femoral endarterectomy in 2024.  Admitted 8/25 with a/c HF with massive volume overload. Diuresed with IV lasix , however threatened to leave AMA 2x and was discharged before fully diuresed.   S/p AF ablation 11/12/24. Volume overloaded and instructed to increase torsemide  to 80 bid x 3 days, then back to 40 bid.  Seen in ED 11/17/24 with volume overload. LWBS due to wait times.  Follow up 11/28/24, massive volume overload with REDs 51% and weight 220 lbs. Responded poorly to previous Furoscix  doses, so torsemide  dose doubled and added metolazone  5 mg x 3 days.     SUBJECTIVE: Today he returns for HF follow up. Overall feeling better. Breathing has improved. Continues on 2L oxygen. He has SOB walking further distances on flat ground. Swelling has improved. Denies palpitations, abnormal bleeding, CP, dizziness, or PND/Orthopnea. Appetite ok. Weight at home 202 pounds. Taking all medications. Smokes 1/2 ppd. Lives with daughter who is an [employer] and manages his medications.  PMH, current medications, allergies, social history, and family history reviewed in epic.  Wt Readings from Last 3 Encounters:  12/01/24 91.8 kg (202 lb 6.4 oz)  11/28/24 99.8 kg (220 lb)  11/19/24 98.1 kg (216 lb 3.2 oz)   BP (!) 104/58   Pulse 93   Wt 91.8 kg (202 lb 6.4 oz)   SpO2 98% Comment: 2 l n/c  BMI 29.89  kg/m   PHYSICAL EXAM: General:  NAD. No resp difficulty, walked into clinic on 2L oxygen HEENT: Normal Neck: Supple. JVP 10-12 Cor: Regular rate & rhythm. No rubs, gallops or murmurs. Lungs: Clear Abdomen: Soft, nontender, nondistended.  Extremities: No cyanosis, clubbing, rash, edema Neuro: Alert & oriented x 3, moves all 4 extremities w/o difficulty. Affect pleasant.  ReDs reading: 56 %, abnormal  DATA REVIEW  ECG: 03/13/24: Aflutter with variable block   11/18/24: NSR 79 bpm    ECHO: 06/21/23: EF 35-40, Gr 2 DD, NL RVSF, mil dLAE, mild MR, mild AI, AV sclerosis, RAP 8, ant-apical AK  03/02/24: LVEF 25-30%, reigonal WMA, moderately reduced RV function  8/25: EF 45%, Akinesis of distal lateral and apical walls. LV with GHK, GIIDD, RV low normal, LA mildly dilated, trivial MR  CATH: 03/14/2023 ([facility]): LAD proximal/mid stent patent with 50 ISR, LIMA-LAD atretic, D1 jailed, SVG-D1 patent, LCx distal 100, RCA mid tandem 90, 80, SVG-distal RCA stent with 90 ISR    ASSESSMENT & PLAN: Chronic Systolic Heart failure: Ischemic related with recent worsening in the setting of atrial flutter.  - Echo 8/25: EF 45%, Akinesis of distal lateral and apical walls. LV with GHK, GIIDD, RV low normal, LA mildly dilated, trivial MR - S/p flutter ablation, though having rare episodes of fib.   - Improved NYHA IIb-early III, volume better today, weight down 18 lbs, REDS 56%  - Add metolazone  5 mg + extra 40 KCL  on Mondays. - Continue torsemide  80 mg bid + 20 KCL bid. - Continue spironolactone  25 mg daily. - Off digoxin  with resumption of NSR and improved EF - No SGLT2i with A1C of 12.3 - Add back Entresto  vs losartan next visit - Patient not a candidate for advanced therapies given severe PAD - Labs today.  AFL/AFib: Recurrent, s/p atrial flutter ablation with Dr. Kennyth 6/25. Loop recorder implanted to evaluate for atrial fibrillation. - s/p AF ablation 12/25. - Now off amiodarone  -  Continue Eliquis  5 mg bid. No bleeding issues. - He has Pulmonary appointment for sleep apnea given that he does not qualify for home sleep study by insurance. Also likely has element of COPD.    CAD: No chest pain - Continue Plavix  & apixaban  - Continue atorvastatin , repatha , LDL at goal.  PAD: Extensive history, carotid as well as femoral disease.  - Not a candidate for advanced therapies - Continue eliquis , plavix , aggressive risk factor modification - Smoking cessation encouraged  IDA:  - Noted Hgb down trending during last admission - TSat 7% 8/25, has received IV iron - Re-check iron panel today.  DM: - Uncontrolled.  - Last A1c 12.5 8/25 - He is on insulin  - Off SGLT2i for now  Follow up in 3 weeks with APP.  [name] [facility] FNP-BC Advanced Heart Failure 12/01/2024 "

## 2024-12-01 NOTE — Telephone Encounter (Signed)
 Patient referred to infusion pharmacy team for ambulatory infusion of IV iron.  Insurance - [employer] Site of care - Site of care: [facility] Dx code - I50.22/D50.9 IV Iron Therapy - Venofer 300mg  x 3 Infusion appointments - Scheduling team will schedule patient as soon as possible.    Sherry Pennant, PharmD, MPH, BCPS, CPP Clinical Pharmacist

## 2024-12-02 ENCOUNTER — Telehealth: Payer: Self-pay

## 2024-12-02 NOTE — Telephone Encounter (Signed)
 Auth Submission: NO AUTH NEEDED Site of care: Site of care: [facility] Payer: [place] MEDICAID HE[place] BLUE  Medication & CPT/J Code(s) submitted: Venofer (Iron Sucrose) J1756 Diagnosis Code:  Route of submission (phone, fax, portal): phone Phone # Fax # Auth type: Buy/Bill HB Units/visits requested: 300mg  x 3 doses Reference number:  Approval from: 12/04/24 to 12/03/25

## 2024-12-05 LAB — CUP PACEART REMOTE DEVICE CHECK
Date Time Interrogation Session: 20260101112400
Implantable Pulse Generator Implant Date: 20250605
Pulse Gen Serial Number: 148873

## 2024-12-06 ENCOUNTER — Ambulatory Visit: Payer: Self-pay | Admitting: Cardiology

## 2024-12-06 LAB — BASIC METABOLIC PANEL WITH GFR
BUN/Creatinine Ratio: 20 (ref 9–20)
BUN: 35 mg/dL — ABNORMAL HIGH (ref 6–24)
CO2: 34 mmol/L — ABNORMAL HIGH (ref 20–29)
Calcium: 9.6 mg/dL (ref 8.7–10.2)
Chloride: 81 mmol/L — ABNORMAL LOW (ref 96–106)
Creatinine, Ser: 1.78 mg/dL — ABNORMAL HIGH (ref 0.76–1.27)
Glucose: 60 mg/dL — ABNORMAL LOW (ref 70–99)
Potassium: 3.5 mmol/L (ref 3.5–5.2)
Sodium: 133 mmol/L — ABNORMAL LOW (ref 134–144)
eGFR: 44 mL/min/1.73 — ABNORMAL LOW

## 2024-12-08 ENCOUNTER — Ambulatory Visit (HOSPITAL_COMMUNITY): Payer: Self-pay | Admitting: Family Medicine

## 2024-12-08 ENCOUNTER — Encounter (HOSPITAL_COMMUNITY): Payer: Self-pay

## 2024-12-08 MED ORDER — METOLAZONE 5 MG PO TABS: 5.0000 mg | ORAL_TABLET | Freq: Every day | ORAL | 0 refills | Status: DC

## 2024-12-08 NOTE — Progress Notes (Signed)
 Remote Loop Recorder Transmission

## 2024-12-09 ENCOUNTER — Encounter: Payer: Self-pay | Admitting: Nurse Practitioner

## 2024-12-09 NOTE — Progress Notes (Signed)
 "  Primary Care Physician: [name], MD Primary Cardiologist: [name], MD Electrophysiologist: Fonda Kitty, MD  Referring Physician: Fonda Kitty, MD  I connected with Ryan [name] on a video enabled telemedicine application and verified that I am speaking with the correct person using two identifiers.  Ryan [name] is a 59 y.o. male with a history of CAD s/p STEMI with multiple MIs and three-vessel CABG 05/2020  (LIMA to LAD, SVG to Diagonal and SVG to RCA), COPD, DM, paroxysmal AF, HFrEF, HLD, tobacco abuse, PVD who presents for follow up in the [facility].    Mr. Ryan [name] has an extensive coronary history dating back to 2013 with cardiac arrest and multiple MIs with PCI's as well as peripheral artery disease. He was diagnosed with atrial fibrillation following CABG in 2021 and was treated with amiodarone  and was also treated with Eliquis  for LV thrombus. He was seen in the ED on 03/01/2024 with intermittent chest pain described as pressure. He was noted to be slightly hypoxic with volume overload and EKG showed atrial flutter. He was evaluated by Dr. Kitty who recommended IV amiodarone  bolus and infusion. He was admitted for IV diuresis with plan to attempt cardioversion if not chemically converted on amiodarone . He did not achieve conversion with amiodarone  alone and underwent DCCV on 03/31/2024.   He was seen in follow-up by Dr. Kitty and continued amiodarone  as a bridge to flutter ablation which took place on 05/05/2024. He also underwent a [employer] loop recorder implant that detected episodes of atrial fibrillation with heart pounding racing and skipping. Echo 8/25: EF 45%, Akinesis of distal lateral and apical walls. LV with GHK, GIIDD, RV low normal, LA mildly dilated, trivial MR. He was continued on amiodarone  200 mg once daily and underwent pulse field ablation completed on 11/12/2024 with successful mapping and PVI completed.  He had  amiodarone  discontinued postprocedure.  He contacted our office on 11/17/2024 with complaint of shortness of breath and inability to lay flat.He was instructed to go to the ED for evaluation He presented to the ED however left AMA and followed up with the advanced heart failure clinic and was seen on 11/19/2024. During visit he was started on Furoscix  daily x 3 days with instructions to go back to torsemide  40 mg twice daily afterwards.  He also had digoxin  discontinued while maintaining sinus rhythm and improved EF.  He was seen again for follow-up on 11/28/2024 and continued to be volume overloaded on exam with complaints of shortness of breath with exertion.  He had torsemide  increased and also metolazone  was added x 3 days with plan to see him back in follow-up and if no improvement we will pursue right heart cath and possible hospital admission.  He was seen in follow-up 12/01/2024 with improved volume and weight down 18 pounds.  Mr. Ryan [name] presents today via virtual visit for his 4-week post ablation follow-up.  He reports since his ablation that he has not experienced any return of tachycardia or palpitations.  He has been compliant with his Eliquis  and denies any missed doses. He reports no complications from his procedural groin sites and is currently being followed by the advanced heart failure clinic for volume management. During our visit we discussed the importance of compliance of his anticoagulant during the blanking period as well as the possibility of returning to episodes of palpitations at this time.  He had all questions answered to his satisfaction during today's visit.  He was advised to  contact our office if he has any further questions concerning his ablation.  Atrial Fibrillation Management history: Completed sleep study with no evidence of sleep apnea noted. Previous antiarrhythmic drugs: Amiodarone  Previous cardioversions:/28/25 Previous ablations: Flutter ablation 05/05/2024 and  post failed ablation 11/12/2024 Anticoagulation history: Eliquis   ROS- All systems are reviewed and negative except as per the HPI above.  Past Medical History:  Diagnosis Date   Acute myocardial infarction of other anterior wall, subsequent episode of care 12/12/2011   Anal fissure    Arthritis    back   Atherosclerosis of native artery of both lower extremities with intermittent claudication 04/01/2018   Bruises easily    d/t being on Effient    CAD (coronary artery disease)    A. Acute Ant STEMI 12/12/2011    Cardiomyopathy secondary 12/26/2011   Chronic systolic CHF (congestive heart failure) (HCC)    ischemic CM // Echo 6/21: EF 30-35  //  Echocardiogram 10/21: LV apical thrombus resolved, EF 50-55, ant and apical, ant-lat HK, trivial MR, trivial AI   Chronic total occlusion of artery of the extremities 02/26/2012   Claudication 12/26/2011   Coronary atherosclerosis of native coronary artery    a. ant STEMI with cardiac arrest 2013 s/p DES to mLAD. b. [place]  10/2016 s/p PTCA to mLAD.   Diabetes mellitus without complication (HCC)    Essential hypertension 10/23/2016   GERD (gastroesophageal reflux disease)    takes tums   History of blood transfusion    no abnormal reaction noted   History of kidney stones    Hypertension    Ischemic cardiomyopathy    a. EF 35% in 02/2012 at time of acute MI, improved to normal on subsequent imaging.   MI (myocardial infarction) (HCC)    AMI 1/13 - complicated by VT/Tosades   Mixed hyperlipidemia    Numbness and tingling of right arm 03/31/2013   Occlusion and stenosis of carotid artery without mention of cerebral infarction 08/26/2012   PAD (peripheral artery disease)    followed by Vascular - aortobifem bypass 01/2013 & left carotid-subclavian artery bypass 04/2013   Peripheral vascular disease, unspecified 12/16/2012   PVD (peripheral vascular disease) 03/31/2013   Rectal polyp    Subclavian steal syndrome 08/11/2013   Uncontrolled diabetes mellitus  10/23/2016   Wears partial dentures    upper    Current Outpatient Medications  Medication Sig Dispense Refill   albuterol  (VENTOLIN  HFA) 108 (90 Base) MCG/ACT inhaler Inhale 1-2 puffs into the lungs every 6 (six) hours as needed for wheezing or shortness of breath.     cetirizine (ZYRTEC) 10 MG tablet Take 10 mg by mouth daily as needed for allergies.     clopidogrel  (PLAVIX ) 75 MG tablet TAKE 1 TABLET BY MOUTH EVERY DAY 15 tablet 0   diazepam  (VALIUM ) 10 MG tablet Take 10 mg by mouth at bedtime.     ELIQUIS  5 MG TABS tablet TAKE 1 TABLET BY MOUTH 2 TIMES A DAY 180 tablet 1   ezetimibe  (ZETIA ) 10 MG tablet TAKE 1 TABLET BY MOUTH EACH DAY 90 tablet 5   Furosemide  (FUROSCIX ) 80 MG/10ML CTKT Inject 80 mg into the skin once daily as directed. (Patient not taking: Reported on 12/01/2024) 5 each 0   gabapentin  (NEURONTIN ) 300 MG capsule Take 300 mg by mouth 2 (two) times daily.     icosapent  Ethyl (VASCEPA ) 1 g capsule TAKE 2 CAPSULES BY MOUTH TWICE DAILY 360 capsule 3   insulin  glargine (LANTUS ) 100 UNIT/ML Solostar Pen Inject  50 Units into the skin 2 (two) times daily.     insulin  lispro (HUMALOG ) 100 UNIT/ML injection Inject 5-15 Units into the skin 3 (three) times daily as needed (blood sugar over 150).     methocarbamol  (ROBAXIN ) 500 MG tablet Take 500 mg by mouth every 6 (six) hours as needed for muscle spasms.     metolazone  (ZAROXOLYN ) 5 MG tablet Take 1 tablet (5 mg total) by mouth daily. 1 tablet 0   nitroGLYCERIN  (NITROSTAT ) 0.4 MG SL tablet Place 0.4 mg under the tongue every 5 (five) minutes as needed for chest pain.     oxyCODONE -acetaminophen  (PERCOCET) 10-325 MG tablet Take 1 tablet by mouth 4 (four) times daily.     OXYGEN Inhale 2 L into the lungs as needed (excessive movement).     pantoprazole  (PROTONIX ) 40 MG tablet Take 40 mg by mouth daily.     potassium chloride  SA (KLOR-CON  M) 20 MEQ tablet Take 3 tablets (60 mEq total) by mouth 2 (two) times daily. Take an extra 2 tablets  (40 meq total) when you take Metolazone . 180 tablet 3   ramelteon  (ROZEREM ) 8 MG tablet TAKE 1 TABLET (8 MG TOTAL) BY MOUTH AT BEDTIME. 30 tablet 2   REPATHA  SURECLICK 140 MG/ML SOAJ INJECT 140 MG INTO THE SKIN EVERY 14 DAYS AS DIRECTED 6 mL 3   Tiotropium Bromide-Olodaterol (STIOLTO RESPIMAT ) 2.5-2.5 MCG/ACT AERS INHALE 2 PUFFS INTO THE LUNGS DAILY. 4 g 11   torsemide  (DEMADEX ) 20 MG tablet Take 4 tablets (80 mg total) by mouth 2 (two) times daily. 240 tablet 3   traZODone  (DESYREL ) 100 MG tablet Take 100 mg by mouth at bedtime.     TRINTELLIX  20 MG TABS tablet Take 20 mg by mouth daily in the afternoon.     Vitamin D, Ergocalciferol, (DRISDOL) 1.25 MG (50000 UNIT) CAPS capsule Take 50,000 Units by mouth every Saturday.     No current facility-administered medications for this visit.    Physical Exam: There were no vitals taken for this visit.   Echo Completed 07/18/2024: 1. Difficult acoustic windows Hypokinesis/ akinesis of the distal lateral  and apical walls.. Left ventricular ejection fraction, by estimation, is  45%. The left ventricle has mildly decreased function. The left ventricle  demonstrates global hypokinesis.   Left ventricular diastolic parameters are consistent with Grade II  diastolic dysfunction (pseudonormalization).   2. Right ventricular systolic function is low normal. The right  ventricular size is normal.   3. Left atrial size was mildly dilated.   4. Trivial mitral valve regurgitation.   5. AV is thickened, calcified with minimally restricted motion Mean  gradeint through the valve is 13 mm Hg. . The aortic valve is tricuspid.  Aortic valve regurgitation is not visualized. Mild aortic valve stenosis.   6. The inferior vena cava is normal in size with greater than 50%  respiratory variability, suggesting right atrial pressure of 3 mmHg.    CHA2DS2-VASc Score = 4  The patient's score is based upon: CHF History: 1 HTN History: 1 Diabetes History: 1 Stroke  History: 0 Vascular Disease History: 1 Age Score: 0 Gender Score: 0       ASSESSMENT AND PLAN: Paroxysmal Atrial Fibrillation (ICD10:  I48.0) The patient's CHA2DS2-VASc score is 4, indicating a 4.8% annual risk of stroke.    -s/p CTI ablation with ILR and A-fib noted with PVI ablation completed on 11/13/2024.  Patient had amiodarone  discontinued post procedure. - Today patient reports no episodes of palpitations or  groin complications or abnormalities since his ablation procedure. -He has been compliant with his Eliquis  and denies any missed doses. -He was advised regarding the importance of compliance and possible return of palpitations during the post 62-month blanking period. - Continue Eliquis  5 mg twice daily  Chronic combined CHF: - Continue current GDMT regimen per advanced heart failure clinic. - Patient has follow-up scheduled on 12/18/2024  History of CAD: -s/p multiple MIs and CABG completed in 2021 with no reports of chest pain or angina during today's visit.  Secondary Hypercoagulable State (ICD10:  D68.69) The patient is at significant risk for stroke/thromboembolism based upon his CHA2DS2-VASc Score of 4.  Continue Apixaban  (Eliquis ).    Signed,  Wyn Raddle, Jackee Shove, NP    12/09/2024 5:10 PM    Follow up with the AF Clinic as needed  I provided 10: Minutes and 12 seconds of non-face-to-face time during this encounter  "

## 2024-12-10 ENCOUNTER — Encounter (HOSPITAL_COMMUNITY): Payer: Self-pay | Admitting: Nurse Practitioner

## 2024-12-10 ENCOUNTER — Ambulatory Visit (HOSPITAL_COMMUNITY)
Admission: RE | Admit: 2024-12-10 | Discharge: 2024-12-10 | Disposition: A | Discharge status: Home / Self Care | Source: Ambulatory Visit | Attending: Nurse Practitioner | Admitting: Nurse Practitioner

## 2024-12-10 VITALS — Ht 69.0 in

## 2024-12-10 DIAGNOSIS — I480 Paroxysmal atrial fibrillation: Secondary | ICD-10-CM | POA: Diagnosis not present

## 2024-12-10 DIAGNOSIS — Z7901 Long term (current) use of anticoagulants: Secondary | ICD-10-CM

## 2024-12-10 DIAGNOSIS — I5020 Unspecified systolic (congestive) heart failure: Secondary | ICD-10-CM | POA: Insufficient documentation

## 2024-12-10 DIAGNOSIS — Z8679 Personal history of other diseases of the circulatory system: Secondary | ICD-10-CM | POA: Diagnosis not present

## 2024-12-10 DIAGNOSIS — I2510 Atherosclerotic heart disease of native coronary artery without angina pectoris: Secondary | ICD-10-CM

## 2024-12-10 DIAGNOSIS — D6859 Other primary thrombophilia: Secondary | ICD-10-CM | POA: Diagnosis present

## 2024-12-10 DIAGNOSIS — I5042 Chronic combined systolic (congestive) and diastolic (congestive) heart failure: Secondary | ICD-10-CM | POA: Diagnosis not present

## 2024-12-10 NOTE — Patient Instructions (Signed)
-  No new orders for visit - Patient advised to follow-up as scheduled in March

## 2024-12-11 ENCOUNTER — Encounter: Attending: Family Medicine | Admitting: *Deleted

## 2024-12-11 VITALS — BP 103/65 | HR 83

## 2024-12-11 DIAGNOSIS — D509 Iron deficiency anemia, unspecified: Secondary | ICD-10-CM | POA: Diagnosis present

## 2024-12-11 DIAGNOSIS — I5023 Acute on chronic systolic (congestive) heart failure: Secondary | ICD-10-CM | POA: Diagnosis not present

## 2024-12-11 MED ORDER — DIPHENHYDRAMINE HCL 25 MG PO CAPS
25.0000 mg | ORAL_CAPSULE | Freq: Once | ORAL | Status: AC
  Administered 2024-12-11: 25 mg via ORAL

## 2024-12-11 MED ORDER — ACETAMINOPHEN 325 MG PO TABS
650.0000 mg | ORAL_TABLET | Freq: Once | ORAL | Status: AC
  Administered 2024-12-11: 650 mg via ORAL

## 2024-12-11 MED ORDER — SODIUM CHLORIDE 0.9 % IV SOLN
300.0000 mg | Freq: Once | INTRAVENOUS | Status: AC
  Administered 2024-12-11: 300 mg via INTRAVENOUS
  Filled 2024-12-11: qty 5

## 2024-12-11 NOTE — Progress Notes (Signed)
 Diagnosis: Iron  Deficiency Anemia  Provider:  Harlene Ganong, FNP  Procedure: IV Infusion  IV Type: Peripheral, IV Location: L Antecubital  Venofer  (Iron  Sucrose), Dose: 300 mg  Infusion Start Time: 1000  Infusion Stop Time: 1145  Post Infusion IV Care: Observation period completed and Peripheral IV Discontinued  Discharge: Condition: Good, Destination: Home . AVS Provided  Performed by:  Baldwin Darice Helling, RN

## 2024-12-12 ENCOUNTER — Encounter

## 2024-12-15 ENCOUNTER — Other Ambulatory Visit (HOSPITAL_COMMUNITY): Payer: Self-pay

## 2024-12-15 ENCOUNTER — Other Ambulatory Visit: Payer: Self-pay

## 2024-12-15 ENCOUNTER — Other Ambulatory Visit (HOSPITAL_BASED_OUTPATIENT_CLINIC_OR_DEPARTMENT_OTHER): Payer: Self-pay

## 2024-12-15 ENCOUNTER — Encounter

## 2024-12-15 MED ORDER — METOLAZONE 5 MG PO TABS
5.0000 mg | ORAL_TABLET | Freq: Every day | ORAL | 0 refills | Status: DC
  Filled 2024-12-15 (×2): qty 4, 4d supply, fill #0

## 2024-12-15 MED ORDER — METOLAZONE 5 MG PO TABS: 5.0000 mg | ORAL_TABLET | Freq: Every day | ORAL | 0 refills | Status: DC

## 2024-12-15 MED ORDER — FUROSCIX 80 MG/10ML ~~LOC~~ CTKT
80.0000 mg | CARTRIDGE | SUBCUTANEOUS | 0 refills | Status: DC
  Filled 2024-12-15 (×3): qty 5, 5d supply, fill #0

## 2024-12-15 NOTE — Progress Notes (Signed)
 Specialty Pharmacy Refill Coordination Note  Ryan [name] is a 59 y.o. male contacted today regarding refills of specialty medication(s) Furosemide  (Furoscix )   Patient requested Delivery   Delivery date: 12/16/24   Verified address: [address] [place] [address]   Medication will be filled on: 12/15/24

## 2024-12-16 ENCOUNTER — Other Ambulatory Visit (HOSPITAL_COMMUNITY): Payer: Self-pay

## 2024-12-17 ENCOUNTER — Telehealth (HOSPITAL_COMMUNITY): Payer: Self-pay
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
## 2024-12-17 NOTE — Telephone Encounter (Signed)
 Called to confirm/remind patient of their appointment at the Advanced Heart Failure Clinic on 12/18/24 11:30.   Appointment:   [x] Confirmed  [] Left mess   [] No answer/No voice mail  [] VM Full/unable to leave message  [] Phone not in service  Patient reminded to bring all medications and/or complete list.  Confirmed patient has transportation. Gave directions, instructed to utilize valet parking.

## 2024-12-18 ENCOUNTER — Ambulatory Visit: Admitting: Primary Care

## 2024-12-18 ENCOUNTER — Other Ambulatory Visit: Payer: Self-pay

## 2024-12-18 ENCOUNTER — Ambulatory Visit

## 2024-12-18 ENCOUNTER — Ambulatory Visit (HOSPITAL_BASED_OUTPATIENT_CLINIC_OR_DEPARTMENT_OTHER)
Admission: RE | Admit: 2024-12-18 | Discharge: 2024-12-18 | Disposition: A | Discharge status: Home / Self Care | Source: Ambulatory Visit | Attending: Internal Medicine | Admitting: Internal Medicine

## 2024-12-18 ENCOUNTER — Telehealth: Payer: Self-pay | Admitting: Primary Care

## 2024-12-18 ENCOUNTER — Encounter (HOSPITAL_COMMUNITY): Payer: Self-pay

## 2024-12-18 ENCOUNTER — Other Ambulatory Visit (HOSPITAL_COMMUNITY): Payer: Self-pay

## 2024-12-18 ENCOUNTER — Emergency Department (HOSPITAL_COMMUNITY)

## 2024-12-18 ENCOUNTER — Inpatient Hospital Stay (HOSPITAL_COMMUNITY)
Admission: EM | Admit: 2024-12-18 | Discharge: 2025-01-04 | DRG: 291 | Disposition: E | Attending: Pulmonary Disease | Admitting: Pulmonary Disease

## 2024-12-18 ENCOUNTER — Encounter: Payer: Self-pay | Admitting: Primary Care

## 2024-12-18 ENCOUNTER — Inpatient Hospital Stay (HOSPITAL_COMMUNITY)

## 2024-12-18 ENCOUNTER — Encounter: Payer: Self-pay | Admitting: Family Medicine

## 2024-12-18 VITALS — BP 118/62 | HR 81 | Temp 97.6°F | Ht 69.0 in | Wt 223.2 lb

## 2024-12-18 VITALS — BP 96/64 | HR 93 | Ht 69.0 in | Wt 225.0 lb

## 2024-12-18 DIAGNOSIS — I462 Cardiac arrest due to underlying cardiac condition: Secondary | ICD-10-CM | POA: Diagnosis not present

## 2024-12-18 DIAGNOSIS — E1165 Type 2 diabetes mellitus with hyperglycemia: Secondary | ICD-10-CM | POA: Insufficient documentation

## 2024-12-18 DIAGNOSIS — Z66 Do not resuscitate: Secondary | ICD-10-CM | POA: Diagnosis not present

## 2024-12-18 DIAGNOSIS — R34 Anuria and oliguria: Secondary | ICD-10-CM | POA: Diagnosis not present

## 2024-12-18 DIAGNOSIS — J690 Pneumonitis due to inhalation of food and vomit: Secondary | ICD-10-CM | POA: Diagnosis not present

## 2024-12-18 DIAGNOSIS — G40401 Other generalized epilepsy and epileptic syndromes, not intractable, with status epilepticus: Secondary | ICD-10-CM | POA: Diagnosis not present

## 2024-12-18 DIAGNOSIS — I130 Hypertensive heart and chronic kidney disease with heart failure and stage 1 through stage 4 chronic kidney disease, or unspecified chronic kidney disease: Principal | ICD-10-CM | POA: Diagnosis present

## 2024-12-18 DIAGNOSIS — Z833 Family history of diabetes mellitus: Secondary | ICD-10-CM

## 2024-12-18 DIAGNOSIS — I5043 Acute on chronic combined systolic (congestive) and diastolic (congestive) heart failure: Secondary | ICD-10-CM | POA: Diagnosis present

## 2024-12-18 DIAGNOSIS — I469 Cardiac arrest, cause unspecified: Secondary | ICD-10-CM

## 2024-12-18 DIAGNOSIS — G931 Anoxic brain damage, not elsewhere classified: Secondary | ICD-10-CM | POA: Diagnosis not present

## 2024-12-18 DIAGNOSIS — Z515 Encounter for palliative care: Secondary | ICD-10-CM

## 2024-12-18 DIAGNOSIS — I739 Peripheral vascular disease, unspecified: Secondary | ICD-10-CM

## 2024-12-18 DIAGNOSIS — Z7901 Long term (current) use of anticoagulants: Secondary | ICD-10-CM | POA: Insufficient documentation

## 2024-12-18 DIAGNOSIS — R0902 Hypoxemia: Secondary | ICD-10-CM | POA: Diagnosis not present

## 2024-12-18 DIAGNOSIS — E66811 Obesity, class 1: Secondary | ICD-10-CM | POA: Diagnosis present

## 2024-12-18 DIAGNOSIS — G40909 Epilepsy, unspecified, not intractable, without status epilepticus: Secondary | ICD-10-CM | POA: Diagnosis present

## 2024-12-18 DIAGNOSIS — I472 Ventricular tachycardia, unspecified: Secondary | ICD-10-CM | POA: Diagnosis present

## 2024-12-18 DIAGNOSIS — E876 Hypokalemia: Secondary | ICD-10-CM | POA: Diagnosis present

## 2024-12-18 DIAGNOSIS — I252 Old myocardial infarction: Secondary | ICD-10-CM

## 2024-12-18 DIAGNOSIS — F32A Depression, unspecified: Secondary | ICD-10-CM | POA: Diagnosis present

## 2024-12-18 DIAGNOSIS — F17210 Nicotine dependence, cigarettes, uncomplicated: Secondary | ICD-10-CM | POA: Diagnosis present

## 2024-12-18 DIAGNOSIS — I5082 Biventricular heart failure: Secondary | ICD-10-CM | POA: Diagnosis present

## 2024-12-18 DIAGNOSIS — I509 Heart failure, unspecified: Secondary | ICD-10-CM

## 2024-12-18 DIAGNOSIS — Z716 Tobacco abuse counseling: Secondary | ICD-10-CM

## 2024-12-18 DIAGNOSIS — J9611 Chronic respiratory failure with hypoxia: Secondary | ICD-10-CM

## 2024-12-18 DIAGNOSIS — I4892 Unspecified atrial flutter: Secondary | ICD-10-CM | POA: Insufficient documentation

## 2024-12-18 DIAGNOSIS — Z6833 Body mass index (BMI) 33.0-33.9, adult: Secondary | ICD-10-CM

## 2024-12-18 DIAGNOSIS — R569 Unspecified convulsions: Secondary | ICD-10-CM | POA: Diagnosis not present

## 2024-12-18 DIAGNOSIS — Z888 Allergy status to other drugs, medicaments and biological substances status: Secondary | ICD-10-CM

## 2024-12-18 DIAGNOSIS — I513 Intracardiac thrombosis, not elsewhere classified: Secondary | ICD-10-CM | POA: Insufficient documentation

## 2024-12-18 DIAGNOSIS — Z951 Presence of aortocoronary bypass graft: Secondary | ICD-10-CM | POA: Insufficient documentation

## 2024-12-18 DIAGNOSIS — Z1152 Encounter for screening for COVID-19: Secondary | ICD-10-CM

## 2024-12-18 DIAGNOSIS — J9621 Acute and chronic respiratory failure with hypoxia: Secondary | ICD-10-CM | POA: Diagnosis not present

## 2024-12-18 DIAGNOSIS — I5084 End stage heart failure: Secondary | ICD-10-CM | POA: Diagnosis present

## 2024-12-18 DIAGNOSIS — W1830XA Fall on same level, unspecified, initial encounter: Secondary | ICD-10-CM | POA: Diagnosis not present

## 2024-12-18 DIAGNOSIS — E1151 Type 2 diabetes mellitus with diabetic peripheral angiopathy without gangrene: Secondary | ICD-10-CM | POA: Insufficient documentation

## 2024-12-18 DIAGNOSIS — R570 Cardiogenic shock: Secondary | ICD-10-CM | POA: Diagnosis not present

## 2024-12-18 DIAGNOSIS — I480 Paroxysmal atrial fibrillation: Secondary | ICD-10-CM | POA: Diagnosis present

## 2024-12-18 DIAGNOSIS — I2510 Atherosclerotic heart disease of native coronary artery without angina pectoris: Secondary | ICD-10-CM | POA: Diagnosis present

## 2024-12-18 DIAGNOSIS — Z79899 Other long term (current) drug therapy: Secondary | ICD-10-CM

## 2024-12-18 DIAGNOSIS — E782 Mixed hyperlipidemia: Secondary | ICD-10-CM | POA: Diagnosis present

## 2024-12-18 DIAGNOSIS — I5023 Acute on chronic systolic (congestive) heart failure: Secondary | ICD-10-CM | POA: Insufficient documentation

## 2024-12-18 DIAGNOSIS — I779 Disorder of arteries and arterioles, unspecified: Secondary | ICD-10-CM | POA: Diagnosis present

## 2024-12-18 DIAGNOSIS — G4700 Insomnia, unspecified: Secondary | ICD-10-CM

## 2024-12-18 DIAGNOSIS — E440 Moderate protein-calorie malnutrition: Secondary | ICD-10-CM | POA: Diagnosis present

## 2024-12-18 DIAGNOSIS — E118 Type 2 diabetes mellitus with unspecified complications: Secondary | ICD-10-CM | POA: Diagnosis not present

## 2024-12-18 DIAGNOSIS — J432 Centrilobular emphysema: Secondary | ICD-10-CM

## 2024-12-18 DIAGNOSIS — E119 Type 2 diabetes mellitus without complications: Secondary | ICD-10-CM | POA: Diagnosis not present

## 2024-12-18 DIAGNOSIS — I10 Essential (primary) hypertension: Secondary | ICD-10-CM | POA: Diagnosis not present

## 2024-12-18 DIAGNOSIS — E871 Hypo-osmolality and hyponatremia: Secondary | ICD-10-CM | POA: Diagnosis not present

## 2024-12-18 DIAGNOSIS — Y92231 Patient bathroom in hospital as the place of occurrence of the external cause: Secondary | ICD-10-CM | POA: Diagnosis not present

## 2024-12-18 DIAGNOSIS — D72829 Elevated white blood cell count, unspecified: Secondary | ICD-10-CM | POA: Diagnosis not present

## 2024-12-18 DIAGNOSIS — Z7902 Long term (current) use of antithrombotics/antiplatelets: Secondary | ICD-10-CM | POA: Diagnosis not present

## 2024-12-18 DIAGNOSIS — Z87442 Personal history of urinary calculi: Secondary | ICD-10-CM

## 2024-12-18 DIAGNOSIS — D509 Iron deficiency anemia, unspecified: Secondary | ICD-10-CM | POA: Diagnosis present

## 2024-12-18 DIAGNOSIS — I4891 Unspecified atrial fibrillation: Secondary | ICD-10-CM | POA: Diagnosis not present

## 2024-12-18 DIAGNOSIS — Z794 Long term (current) use of insulin: Secondary | ICD-10-CM

## 2024-12-18 DIAGNOSIS — I5021 Acute systolic (congestive) heart failure: Secondary | ICD-10-CM | POA: Diagnosis not present

## 2024-12-18 DIAGNOSIS — E785 Hyperlipidemia, unspecified: Secondary | ICD-10-CM | POA: Diagnosis not present

## 2024-12-18 DIAGNOSIS — J449 Chronic obstructive pulmonary disease, unspecified: Secondary | ICD-10-CM | POA: Diagnosis not present

## 2024-12-18 DIAGNOSIS — I255 Ischemic cardiomyopathy: Secondary | ICD-10-CM | POA: Diagnosis present

## 2024-12-18 DIAGNOSIS — Z885 Allergy status to narcotic agent status: Secondary | ICD-10-CM

## 2024-12-18 DIAGNOSIS — I493 Ventricular premature depolarization: Secondary | ICD-10-CM | POA: Diagnosis present

## 2024-12-18 DIAGNOSIS — R4182 Altered mental status, unspecified: Secondary | ICD-10-CM | POA: Diagnosis not present

## 2024-12-18 DIAGNOSIS — W010XXA Fall on same level from slipping, tripping and stumbling without subsequent striking against object, initial encounter: Secondary | ICD-10-CM | POA: Diagnosis not present

## 2024-12-18 DIAGNOSIS — E1122 Type 2 diabetes mellitus with diabetic chronic kidney disease: Secondary | ICD-10-CM | POA: Diagnosis present

## 2024-12-18 DIAGNOSIS — R001 Bradycardia, unspecified: Secondary | ICD-10-CM | POA: Diagnosis not present

## 2024-12-18 DIAGNOSIS — Z955 Presence of coronary angioplasty implant and graft: Secondary | ICD-10-CM

## 2024-12-18 DIAGNOSIS — N170 Acute kidney failure with tubular necrosis: Secondary | ICD-10-CM | POA: Diagnosis not present

## 2024-12-18 DIAGNOSIS — N1831 Chronic kidney disease, stage 3a: Secondary | ICD-10-CM | POA: Diagnosis present

## 2024-12-18 DIAGNOSIS — I110 Hypertensive heart disease with heart failure: Secondary | ICD-10-CM | POA: Diagnosis not present

## 2024-12-18 DIAGNOSIS — N1832 Chronic kidney disease, stage 3b: Secondary | ICD-10-CM | POA: Diagnosis present

## 2024-12-18 DIAGNOSIS — G4089 Other seizures: Secondary | ICD-10-CM | POA: Diagnosis not present

## 2024-12-18 DIAGNOSIS — R5383 Other fatigue: Secondary | ICD-10-CM | POA: Diagnosis present

## 2024-12-18 DIAGNOSIS — R0602 Shortness of breath: Secondary | ICD-10-CM

## 2024-12-18 DIAGNOSIS — Z825 Family history of asthma and other chronic lower respiratory diseases: Secondary | ICD-10-CM

## 2024-12-18 HISTORY — DX: Unstable angina: I20.0

## 2024-12-18 LAB — BASIC METABOLIC PANEL WITH GFR
Anion gap: 13 (ref 5–15)
BUN: 51 mg/dL — ABNORMAL HIGH (ref 6–20)
CO2: 35 mmol/L — ABNORMAL HIGH (ref 22–32)
Calcium: 8.6 mg/dL — ABNORMAL LOW (ref 8.9–10.3)
Chloride: 84 mmol/L — ABNORMAL LOW (ref 98–111)
Creatinine, Ser: 1.51 mg/dL — ABNORMAL HIGH (ref 0.61–1.24)
GFR, Estimated: 53 mL/min — ABNORMAL LOW
Glucose, Bld: 174 mg/dL — ABNORMAL HIGH (ref 70–99)
Potassium: 3 mmol/L — ABNORMAL LOW (ref 3.5–5.1)
Sodium: 132 mmol/L — ABNORMAL LOW (ref 135–145)

## 2024-12-18 LAB — MAGNESIUM: Magnesium: 2.5 mg/dL — ABNORMAL HIGH (ref 1.7–2.4)

## 2024-12-18 LAB — CBC
HCT: 31.2 % — ABNORMAL LOW (ref 39.0–52.0)
Hemoglobin: 9.2 g/dL — ABNORMAL LOW (ref 13.0–17.0)
MCH: 22.5 pg — ABNORMAL LOW (ref 26.0–34.0)
MCHC: 29.5 g/dL — ABNORMAL LOW (ref 30.0–36.0)
MCV: 76.5 fL — ABNORMAL LOW (ref 80.0–100.0)
Platelets: 241 K/uL (ref 150–400)
RBC: 4.08 MIL/uL — ABNORMAL LOW (ref 4.22–5.81)
RDW: 25 % — ABNORMAL HIGH (ref 11.5–15.5)
WBC: 8 K/uL (ref 4.0–10.5)
nRBC: 0 % (ref 0.0–0.2)

## 2024-12-18 LAB — RESP PANEL BY RT-PCR (RSV, FLU A&B, COVID)  RVPGX2
Influenza A by PCR: NEGATIVE
Influenza B by PCR: NEGATIVE
Resp Syncytial Virus by PCR: NEGATIVE
SARS Coronavirus 2 by RT PCR: NEGATIVE

## 2024-12-18 LAB — TROPONIN T, HIGH SENSITIVITY
Troponin T High Sensitivity: 49 ng/L — ABNORMAL HIGH (ref 0–19)
Troponin T High Sensitivity: 51 ng/L — ABNORMAL HIGH (ref 0–19)

## 2024-12-18 LAB — GLUCOSE, CAPILLARY: Glucose-Capillary: 244 mg/dL — ABNORMAL HIGH (ref 70–99)

## 2024-12-18 LAB — PRO BRAIN NATRIURETIC PEPTIDE: Pro Brain Natriuretic Peptide: 3670 pg/mL — ABNORMAL HIGH

## 2024-12-18 MED ORDER — ALBUTEROL SULFATE (2.5 MG/3ML) 0.083% IN NEBU: 2.5000 mg | INHALATION_SOLUTION | Freq: Four times a day (QID) | RESPIRATORY_TRACT | Status: DC | PRN

## 2024-12-18 MED ORDER — POTASSIUM CHLORIDE CRYS ER 20 MEQ PO TBCR: 40.0000 meq | EXTENDED_RELEASE_TABLET | ORAL | Status: DC

## 2024-12-18 MED ORDER — INSULIN ASPART 100 UNIT/ML IJ SOLN
0.0000 [IU] | Freq: Three times a day (TID) | INTRAMUSCULAR | Status: DC
  Administered 2024-12-19: 5 [IU] via SUBCUTANEOUS
  Administered 2024-12-19: 3 [IU] via SUBCUTANEOUS
  Administered 2024-12-19 – 2024-12-21 (×4): 2 [IU] via SUBCUTANEOUS
  Filled 2024-12-18: qty 3
  Filled 2024-12-18: qty 5
  Filled 2024-12-18 (×3): qty 2

## 2024-12-18 MED ORDER — RAMELTEON 8 MG PO TABS
8.0000 mg | ORAL_TABLET | Freq: Every day | ORAL | Status: DC
  Administered 2024-12-18 – 2024-12-19 (×2): 8 mg via ORAL
  Filled 2024-12-18 (×3): qty 1

## 2024-12-18 MED ORDER — ACETAZOLAMIDE SODIUM 500 MG IJ SOLR
500.0000 mg | Freq: Four times a day (QID) | INTRAMUSCULAR | Status: AC
  Administered 2024-12-18 (×2): 500 mg via INTRAVENOUS
  Filled 2024-12-18 (×2): qty 500

## 2024-12-18 MED ORDER — POTASSIUM CHLORIDE CRYS ER 20 MEQ PO TBCR: 40.0000 meq | EXTENDED_RELEASE_TABLET | Freq: Once | ORAL | Status: DC

## 2024-12-18 MED ORDER — ICOSAPENT ETHYL 1 G PO CAPS
2.0000 g | ORAL_CAPSULE | Freq: Two times a day (BID) | ORAL | Status: DC
  Administered 2024-12-18 – 2024-12-20 (×4): 2 g via ORAL
  Filled 2024-12-18 (×5): qty 2

## 2024-12-18 MED ORDER — ACETAMINOPHEN 325 MG PO TABS
650.0000 mg | ORAL_TABLET | Freq: Four times a day (QID) | ORAL | Status: DC | PRN
  Filled 2024-12-18: qty 2

## 2024-12-18 MED ORDER — DOBUTAMINE INFUSION FOR EP/ECHO/NUC (1000 MCG/ML)
0.0000 ug/kg/min | INTRAVENOUS | Status: DC
  Filled 2024-12-18: qty 250

## 2024-12-18 MED ORDER — UMECLIDINIUM BROMIDE 62.5 MCG/ACT IN AEPB
1.0000 | INHALATION_SPRAY | Freq: Every day | RESPIRATORY_TRACT | Status: DC
  Administered 2024-12-19 – 2024-12-20 (×2): 1 via RESPIRATORY_TRACT
  Filled 2024-12-18: qty 7

## 2024-12-18 MED ORDER — DOBUTAMINE-DEXTROSE 4-5 MG/ML-% IV SOLN
5.0000 ug/kg/min | INTRAVENOUS | Status: DC
  Filled 2024-12-18: qty 250

## 2024-12-18 MED ORDER — APIXABAN 5 MG PO TABS
5.0000 mg | ORAL_TABLET | Freq: Two times a day (BID) | ORAL | Status: DC
  Administered 2024-12-18 – 2024-12-20 (×4): 5 mg via ORAL
  Filled 2024-12-18 (×4): qty 1

## 2024-12-18 MED ORDER — INSULIN GLARGINE-YFGN 100 UNIT/ML ~~LOC~~ SOLN
25.0000 [IU] | Freq: Two times a day (BID) | SUBCUTANEOUS | Status: DC
  Administered 2024-12-18: 25 [IU] via SUBCUTANEOUS
  Filled 2024-12-18 (×3): qty 0.25

## 2024-12-18 MED ORDER — ARFORMOTEROL TARTRATE 15 MCG/2ML IN NEBU
15.0000 ug | INHALATION_SOLUTION | Freq: Two times a day (BID) | RESPIRATORY_TRACT | Status: DC
  Administered 2024-12-18 – 2024-12-23 (×10): 15 ug via RESPIRATORY_TRACT
  Filled 2024-12-18 (×10): qty 2

## 2024-12-18 MED ORDER — CLOPIDOGREL BISULFATE 75 MG PO TABS
75.0000 mg | ORAL_TABLET | Freq: Every day | ORAL | Status: DC
  Administered 2024-12-19 – 2024-12-21 (×3): 75 mg via ORAL
  Filled 2024-12-18 (×3): qty 1

## 2024-12-18 MED ORDER — POTASSIUM CHLORIDE 10 MEQ/100ML IV SOLN
10.0000 meq | INTRAVENOUS | Status: AC
  Administered 2024-12-18 (×5): 10 meq via INTRAVENOUS
  Filled 2024-12-18 (×5): qty 100

## 2024-12-18 MED ORDER — EZETIMIBE 10 MG PO TABS
10.0000 mg | ORAL_TABLET | Freq: Every day | ORAL | Status: DC
  Administered 2024-12-19 – 2024-12-21 (×3): 10 mg via ORAL
  Filled 2024-12-18 (×3): qty 1

## 2024-12-18 MED ORDER — PANTOPRAZOLE SODIUM 40 MG PO TBEC
40.0000 mg | DELAYED_RELEASE_TABLET | Freq: Every day | ORAL | Status: DC
  Administered 2024-12-19 – 2024-12-20 (×2): 40 mg via ORAL
  Filled 2024-12-18 (×2): qty 1

## 2024-12-18 MED ORDER — POTASSIUM CHLORIDE 10 MEQ/100ML IV SOLN: 10.0000 meq | INTRAVENOUS | Status: DC

## 2024-12-18 MED ORDER — ACETAMINOPHEN 650 MG RE SUPP: 650.0000 mg | Freq: Four times a day (QID) | RECTAL | Status: DC | PRN

## 2024-12-18 MED ORDER — POTASSIUM CHLORIDE CRYS ER 20 MEQ PO TBCR
40.0000 meq | EXTENDED_RELEASE_TABLET | ORAL | Status: AC
  Administered 2024-12-18 – 2024-12-19 (×3): 40 meq via ORAL
  Filled 2024-12-18 (×3): qty 2

## 2024-12-18 MED ORDER — GABAPENTIN 300 MG PO CAPS
300.0000 mg | ORAL_CAPSULE | Freq: Two times a day (BID) | ORAL | Status: DC
  Administered 2024-12-18 – 2024-12-20 (×4): 300 mg via ORAL
  Filled 2024-12-18 (×4): qty 1

## 2024-12-18 MED ORDER — FUROSEMIDE 10 MG/ML IJ SOLN
160.0000 mg | Freq: Once | INTRAVENOUS | Status: DC
  Filled 2024-12-18: qty 16

## 2024-12-18 MED ORDER — SODIUM CHLORIDE 0.9% FLUSH
3.0000 mL | Freq: Two times a day (BID) | INTRAVENOUS | Status: DC
  Administered 2024-12-18 – 2024-12-23 (×8): 3 mL via INTRAVENOUS

## 2024-12-18 MED ORDER — STERILE WATER FOR INJECTION IJ SOLN
INTRAMUSCULAR | Status: AC
  Administered 2024-12-18: 10 mL
  Filled 2024-12-18: qty 10

## 2024-12-18 MED ORDER — OXYCODONE-ACETAMINOPHEN 5-325 MG PO TABS
1.0000 | ORAL_TABLET | Freq: Four times a day (QID) | ORAL | Status: AC | PRN
  Administered 2024-12-18 (×2): 2 via ORAL
  Filled 2024-12-18 (×2): qty 2

## 2024-12-18 MED ORDER — FUROSEMIDE 10 MG/ML IJ SOLN
20.0000 mg/h | INTRAVENOUS | Status: DC
  Administered 2024-12-18 – 2024-12-20 (×5): 20 mg/h via INTRAVENOUS
  Filled 2024-12-18: qty 200
  Filled 2024-12-18: qty 20
  Filled 2024-12-18 (×2): qty 200
  Filled 2024-12-18 (×3): qty 20

## 2024-12-18 MED ORDER — MAGNESIUM SULFATE 2 GM/50ML IV SOLN
2.0000 g | Freq: Once | INTRAVENOUS | Status: AC
  Administered 2024-12-18: 2 g via INTRAVENOUS
  Filled 2024-12-18: qty 50

## 2024-12-18 MED ORDER — DOBUTAMINE-DEXTROSE 4-5 MG/ML-% IV SOLN
5.0000 ug/kg/min | INTRAVENOUS | Status: DC
  Administered 2024-12-18: 5 ug/kg/min via INTRAVENOUS
  Filled 2024-12-18: qty 250

## 2024-12-18 MED ORDER — POLYETHYLENE GLYCOL 3350 17 G PO PACK: 17.0000 g | PACK | Freq: Every day | ORAL | Status: DC | PRN

## 2024-12-18 NOTE — ED Provider Triage Note (Signed)
 Emergency Medicine Provider Triage Evaluation Note  Ryan [name] , a 59 y.o. male  was evaluated in triage.  Pt complains of dib/ fluid overload.  Patient reports that he has been gaining weight over the past few weeks, this despite hearings to his diuretic.  Feels has gained over 25 pounds.  He is on 2 L nasal cannula.  Unable to walk or lay flat without becoming severely dyspneic.  Review of Systems  Positive: dib Negative: fever  Physical Exam  BP (!) 145/115 (BP Location: Left Arm)   Pulse 92   Resp (!) 21   Ht 5' 9 (1.753 m)   Wt 102.1 kg   SpO2 99%   BMI 33.23 kg/m  Gen:   Awake, no distress   Resp:  Normal effort, Rales MSK:   Moves extremities without difficulty  Other:  3+  pitting BLLE, abd distension  Medical Decision Making  Medically screening exam initiated at 12:35 PM.  Appropriate orders placed.  Ryan [name] was informed that the remainder of the evaluation will be completed by another provider, this initial triage assessment does not replace that evaluation, and the importance of remaining in the ED until their evaluation is complete.     Ryan Jayson LABOR, DO 12/18/24 1236

## 2024-12-18 NOTE — ED Notes (Signed)
 CCMD called and pt placed on monitoring.

## 2024-12-18 NOTE — Progress Notes (Signed)
 Discussed chest xray result with PICC RN. Line is malpositioned. PICC team to address on 1/16. RN notified

## 2024-12-18 NOTE — ED Notes (Signed)
 Pt sitting up on side of bed eating burger

## 2024-12-18 NOTE — ED Provider Notes (Signed)
 " [facility] Provider Note   CSN: [id number] Arrival date & time: 12/18/24  1210     Patient presents with: Shortness of Breath   Ryan [name] is a 59 y.o. male.    Shortness of Breath  Patient is a 59 year old male with past medical history significant for CABG in 2021, multiple stents after this, aortobifem bypass in 2014, left subclavian bypass in 2014, multiple other bypasses, has a history of CHF is follow-up with cardiology.  Seems that he has been struggling with more dyspneic.  No chest pain or palpitations and no orthopnea.  He does have swelling up into the abdomen     Prior to Admission medications  Medication Sig Start Date End Date Taking? Authorizing Provider  albuterol  (VENTOLIN  HFA) 108 (90 Base) MCG/ACT inhaler Inhale 1-2 puffs into the lungs every 6 (six) hours as needed for wheezing or shortness of breath.    [provider]  cetirizine (ZYRTEC) 10 MG tablet Take 10 mg by mouth daily as needed for allergies. 06/02/21   [provider]  clopidogrel  (PLAVIX ) 75 MG tablet TAKE 1 TABLET BY MOUTH EVERY DAY 09/01/24   Verlin Lonni BIRCH, MD  diazepam  (VALIUM ) 10 MG tablet Take 10 mg by mouth at bedtime. 03/10/21   [provider]  ELIQUIS  5 MG TABS tablet TAKE 1 TABLET BY MOUTH 2 TIMES A DAY 09/29/24   [name], MD  ezetimibe  (ZETIA ) 10 MG tablet TAKE 1 TABLET BY MOUTH EACH DAY 03/07/24   Weaver, Scott T, PA-C  Furosemide  (FUROSCIX ) 80 MG/10ML CTKT Inject 80 mg into the skin once daily as directed. 12/15/24   Milford, [name], FNP  gabapentin  (NEURONTIN ) 300 MG capsule Take 300 mg by mouth 2 (two) times daily. 03/25/20   [provider]  icosapent  Ethyl (VASCEPA ) 1 g capsule TAKE 2 CAPSULES BY MOUTH TWICE DAILY 05/30/24   Lelon Hamilton T, PA-C  insulin  glargine (LANTUS ) 100 UNIT/ML Solostar Pen Inject 50 Units into the skin 2 (two) times daily.    [provider]  insulin   lispro (HUMALOG ) 100 UNIT/ML injection Inject 5-15 Units into the skin 3 (three) times daily as needed (blood sugar over 150).    [provider]  methocarbamol  (ROBAXIN ) 500 MG tablet Take 500 mg by mouth every 6 (six) hours as needed for muscle spasms. 11/05/24   [provider]  metolazone  (ZAROXOLYN ) 5 MG tablet Take 1 tablet (5 mg total) by mouth daily. 12/15/24   Milford, [name], FNP  nitroGLYCERIN  (NITROSTAT ) 0.4 MG SL tablet Place 0.4 mg under the tongue every 5 (five) minutes as needed for chest pain.    [provider]  oxyCODONE -acetaminophen  (PERCOCET) 10-325 MG tablet Take 1 tablet by mouth 4 (four) times daily.    [provider]  OXYGEN Inhale 2 L into the lungs as needed (excessive movement).    [provider]  pantoprazole  (PROTONIX ) 40 MG tablet Take 40 mg by mouth daily. 03/07/24   [provider]  potassium chloride  SA (KLOR-CON  M) 20 MEQ tablet Take 3 tablets (60 mEq total) by mouth 2 (two) times daily. Take an extra 2 tablets (40 meq total) when you take Metolazone . 12/01/24   Milford, [name], FNP  ramelteon  (ROZEREM ) 8 MG tablet TAKE 1 TABLET (8 MG TOTAL) BY MOUTH AT BEDTIME. 12/01/24   Olalere, Adewale A, MD  REPATHA  SURECLICK 140 MG/ML SOAJ INJECT 140 MG INTO THE SKIN EVERY 14 DAYS AS DIRECTED  05/30/24   [name], MD  Tiotropium Bromide-Olodaterol (STIOLTO RESPIMAT ) 2.5-2.5 MCG/ACT AERS INHALE 2 PUFFS INTO THE LUNGS DAILY. 12/01/24   Olalere, Jennet LABOR, MD  torsemide  (DEMADEX ) 20 MG tablet Take 4 tablets (80 mg total) by mouth 2 (two) times daily. 11/28/24   Clegg, [name], NP  traZODone  (DESYREL ) 100 MG tablet Take 100 mg by mouth at bedtime. Patient not taking: Reported on 12/18/2024    [provider]  TRINTELLIX  20 MG TABS tablet Take 20 mg by mouth daily in the afternoon. 12/29/22   [provider]  Vitamin D, Ergocalciferol, (DRISDOL) 1.25 MG (50000 UNIT) CAPS capsule Take 50,000 Units by mouth  every Saturday. Patient not taking: Reported on 12/18/2024 04/01/24   [provider]    Allergies: Metformin and Hydrocodone     Review of Systems  Respiratory:  Positive for shortness of breath.     Updated Vital Signs BP 99/69 (BP Location: Left Arm)   Pulse 89   Temp 98 F (36.7 C) (Oral)   Resp (!) 28   Ht 5' 9 (1.753 m)   Wt 102.1 kg   SpO2 95%   BMI 33.23 kg/m   Physical Exam Vitals and nursing note reviewed.  Constitutional:      General: He is in acute distress.     Comments: Unwell appearing 59 year old male  HENT:     Head: Normocephalic and atraumatic.     Nose: Nose normal.  Eyes:     General: No scleral icterus. Cardiovascular:     Rate and Rhythm: Normal rate and regular rhythm.     Pulses: Normal pulses.     Heart sounds: Normal heart sounds.  Pulmonary:     Breath sounds: Rales present.     Comments: Tachypnea present Abdominal:     Palpations: Abdomen is soft.     Tenderness: There is no abdominal tenderness. There is no guarding or rebound.     Comments: Abdominal anasarca  Musculoskeletal:     Cervical back: Normal range of motion.     Right lower leg: Edema present.     Left lower leg: Edema present.  Skin:    General: Skin is warm and dry.     Capillary Refill: Capillary refill takes less than 2 seconds.  Neurological:     Mental Status: He is alert. Mental status is at baseline.  Psychiatric:        Mood and Affect: Mood normal.        Behavior: Behavior normal.     (all labs ordered are listed, but only abnormal results are displayed) Labs Reviewed  BASIC METABOLIC PANEL WITH GFR - Abnormal; Notable for the following components:      Result Value   Sodium 132 (*)    Potassium 3.0 (*)    Chloride 84 (*)    CO2 35 (*)    Glucose, Bld 174 (*)    BUN 51 (*)    Creatinine, Ser 1.51 (*)    Calcium  8.6 (*)    GFR, Estimated 53 (*)    All other components within normal limits  CBC - Abnormal; Notable for the following  components:   RBC 4.08 (*)    Hemoglobin 9.2 (*)    HCT 31.2 (*)    MCV 76.5 (*)    MCH 22.5 (*)    MCHC 29.5 (*)    RDW 25.0 (*)    All other components within normal limits  PRO BRAIN NATRIURETIC PEPTIDE -  Abnormal; Notable for the following components:   Pro Brain Natriuretic Peptide 3,670.0 (*)    All other components within normal limits  TROPONIN T, HIGH SENSITIVITY - Abnormal; Notable for the following components:   Troponin T High Sensitivity 49 (*)    All other components within normal limits  RESP PANEL BY RT-PCR (RSV, FLU A&B, COVID)  RVPGX2  TROPONIN T, HIGH SENSITIVITY    EKG: None  Radiology: DG Chest 2 View Result Date: 12/18/2024 EXAM: 2 VIEW(S) XRAY OF THE CHEST 12/18/2024 01:31:00 PM COMPARISON: 11/17/2024 CLINICAL HISTORY: sob FINDINGS: LINES, TUBES AND DEVICES: Left chest loop recorder noted. LUNGS AND PLEURA: Mild pulmonary edema. Moderate right pleural effusion with adjacent right basilar atelectasis. Trace left pleural effusion. No pneumothorax. HEART AND MEDIASTINUM: Stable cardiomediastinal silhouette with post-sternotomy and CABG changes. Similar cardiomegaly. BONES AND SOFT TISSUES: Similar discontinuity of sternotomy wires. IMPRESSION: 1. Mild pulmonary edema with moderate right pleural effusion and adjacent right basilar atelectasis. 2. Trace left pleural effusion. Electronically signed by: Waddell Calk MD 12/18/2024 01:51 PM EST RP Workstation: [id number]     .Critical Care  Performed by: Neldon Hamp RAMAN, PA Authorized by: Neldon Hamp RAMAN, PA   Critical care provider statement:    Critical care time (minutes):  35   Critical care time was exclusive of:  Separately billable procedures and treating other patients and teaching time   Critical care was necessary to treat or prevent imminent or life-threatening deterioration of the following conditions:  Respiratory failure   Critical care was time spent personally by me on the following activities:   Development of treatment plan with patient or surrogate, review of old charts, re-evaluation of patient's condition, pulse oximetry, ordering and review of radiographic studies, ordering and review of laboratory studies, ordering and performing treatments and interventions, obtaining history from patient or surrogate, examination of patient and evaluation of patient's response to treatment   Care discussed with: admitting provider      Medications Ordered in the ED - No data to display                                  Medical Decision Making Amount and/or Complexity of Data Reviewed Labs: ordered. Radiology: ordered.  Risk Prescription drug management. Decision regarding hospitalization.   This patient presents to the ED for concern of SOB, this involves a number of treatment options, and is a complaint that carries with it a high risk of complications and morbidity. A differential diagnosis was considered for the patient's symptoms which is discussed below:   The causes for shortness of breath include but are not limited to Cardiac (AHF, pericardial effusion and tamponade, arrhythmias, ischemia, etc) Respiratory (COPD, asthma, pneumonia, pneumothorax, primary pulmonary hypertension, PE/VQ mismatch) Hematological (anemia) Neuromuscular (ALS, Guillain-Barr, etc)    Co morbidities: Discussed in HPI   Brief History:  Patient is a 59 year old male with past medical history significant for CABG in 2021, multiple stents after this, aortobifem bypass in 2014, left subclavian bypass in 2014, multiple other bypasses, has a history of CHF is follow-up with cardiology.  Seems that he has been struggling with more dyspneic.  No chest pain or palpitations and no orthopnea.  He does have swelling up into the abdomen    EMR reviewed including pt PMHx, past surgical history and past visits to ER.   See HPI for more details   Lab Tests:  I ordered and independently interpreted  labs.  Labs notable for Troponin mildly elevated consistent with CHF, BNP elevated greater than 3.5 thousand, BMP with hypokalemia hyponatremia elevated glucose CBC with anemia  Respiratory panel negative  Imaging Studies:  Abnormal findings. I personally reviewed all imaging studies. Imaging notable for Pulmonary edema   Cardiac Monitoring:  The patient was maintained on a cardiac monitor.  I personally viewed and interpreted the cardiac monitored which showed an underlying rhythm of: NSR EKG non-ischemic   Medicines ordered:  I ordered medication including Lasix  drip, bolus 160 mg Lasix  dose, potassium, dobutamine  for blood pressure support and diuresis Reevaluation of the patient after these medicines showed that the patient improved I have reviewed the patients home medicines and have made adjustments as needed   Critical Interventions:     Consults/Attending Physician   I discussed this case with my attending physician who cosigned this note including patient's presenting symptoms, physical exam, and planned diagnostics and interventions. Attending physician stated agreement with plan or made changes to plan which were implemented.  Discussed with cardiology who will see in consultation, they have already made recommendations  Discussed with neuromas and teaching service who will admit  Reevaluation:  After the interventions noted above I re-evaluated patient and found that they have :stayed the same   Social Determinants of Health:      Problem List / ED Course:  Respiratory failure secondary to CHF.  Has diffuse anasarca up to abdomen and pulmonary edema on chest x-ray crackles and is dyspneic.  Aggressive diuresis ordered cardiology to see admitted to teaching service.   Dispostion:  After consideration of the diagnostic results and the patients response to treatment, I feel that the patent would benefit from admission      Final diagnoses:  None     ED Discharge Orders     None          Neldon Hamp RAMAN, [place] 12/18/24 1610    [name] Sora, MD 12/19/24 [phone]  "

## 2024-12-18 NOTE — Progress Notes (Signed)
 Peripherally Inserted Central Catheter Placement  The IV Nurse has discussed with the patient and/or persons authorized to consent for the patient, the purpose of this procedure and the potential benefits and risks involved with this procedure.  The benefits include less needle sticks, lab draws from the catheter, and the patient may be discharged home with the catheter. Risks include, but not limited to, infection, bleeding, blood clot (thrombus formation), and puncture of an artery; nerve damage and irregular heartbeat and possibility to perform a PICC exchange if needed/ordered by physician.  Alternatives to this procedure were also discussed.  Bard Power PICC patient education guide, fact sheet on infection prevention and patient information card has been provided to patient /or left at bedside.    PICC Placement Documentation  PICC Double Lumen 12/18/24 Right Basilic 43 cm 0 cm (Active)  Indication for Insertion or Continuance of Line Chronic illness with exacerbations (CF, Sickle Cell, etc.) 12/18/24 2020  Exposed Catheter (cm) 0 cm 12/18/24 2020  Site Assessment Clean, Dry, Intact 12/18/24 2020  Lumen #1 Status Saline locked;Blood return noted 12/18/24 2020  Lumen #2 Status Saline locked;Blood return noted 12/18/24 2020  Dressing Type Transparent;Securing device 12/18/24 2020  Dressing Status Antimicrobial disc/dressing in place;Clean, Dry, Intact 12/18/24 2020  Line Care Connections checked and tightened 12/18/24 2020  Line Adjustment (NICU/IV Team Only) No 12/18/24 2020  Dressing Intervention New dressing;Adhesive placed at insertion site (IV team only) 12/18/24 2020  Dressing Change Due 12/25/24 12/18/24 2020       Ryan [name] 12/18/2024, 8:36 PM

## 2024-12-18 NOTE — Patient Instructions (Signed)
 PLEASE REPORT TO TH EMERGENCY ROOM   Follow-Up in: TO BE DETERMINED AFTER HOSPITAL STAY   At the [facility], you and your health needs are our priority. We have a designated team specialized in the treatment of Heart Failure. This Care Team includes your primary Heart Failure Specialized Cardiologist (physician), Advanced Practice Providers (APPs- Physician Assistants and Nurse Practitioners), and Pharmacist who all work together to provide you with the care you need, when you need it.   You may see any of the following providers on your designated Care Team at your next follow up:  Dr. Toribio Fuel Dr. Ezra Shuck Dr. Odis Brownie Greig Mosses, NP Caffie Shed, [place] [facility] [place], [place] Beckey Coe, NP Jordan Lee, NP Tinnie Redman, PharmD   Please be sure to bring in all your medications bottles to every appointment.   Need to Contact Us :  If you have any questions or concerns before your next appointment please send us  a message through [facility] or call our office at [phone].    TO LEAVE A MESSAGE FOR THE NURSE SELECT OPTION 2, PLEASE LEAVE A MESSAGE INCLUDING: YOUR NAME DATE OF BIRTH CALL BACK NUMBER REASON FOR CALL**this is important as we prioritize the call backs  YOU WILL RECEIVE A CALL BACK THE SAME DAY AS LONG AS YOU CALL BEFORE 4:00 PM

## 2024-12-18 NOTE — Telephone Encounter (Signed)
 Please sign this patient's DME order

## 2024-12-18 NOTE — ED Triage Notes (Signed)
 Pt to er, pt states that he is here for fluid overload, states that he is about 25 lbs of water  weight.  Pt states that it started 2-3 months ago, and has tried some fluids pills, pt states that he is more sob with exertion, pt talking in full sentences, pt has edema to abd.

## 2024-12-18 NOTE — Progress Notes (Signed)
"   ReDS Vest / Clip - 12/18/24 1100       ReDS Vest / Clip   Station Marker C    Ruler Value 34    ReDS Value Range High volume overload    ReDS Actual Value 59          "

## 2024-12-18 NOTE — H&P (Signed)
 " History and Physical   Ryan [name] FMW:[id number] DOB: [birth year] DOA: 12/18/2024  PCP: [name], MD   Patient coming from: Advanced heart failure clinic  Chief Complaint: Shortness of breath, edema  HPI: Ryan [name] is a 59 y.o. male with medical history significant of hypertension, hyperlipidemia, diabetes, CKD 3A, atrial fibrillation, CAD status post CABG, carotid artery disease, peripheral artery disease status post intervention, chronic combined systolic and diastolic CHF, obesity, emphysema, anemia, chronic respiratory failure with hypoxia presenting with worsening shortness of breath and edema.  Patient presented to advanced heart failure clinic for follow-up today.  Reported worsening shortness of breath and edema.  Had recently completed Furoscix  and metolazone  with minimal urine output.  Also reporting some dizziness on standing.  Was sent to the ED for admission and IV diuresis with inotrope support.  Patient denies fevers, chills, abdominal pain, constipation, diarrhea, nausea, vomiting.  ED Course: Vital signs in the ED notable for blood pressure in the 80s-140 systolic, respirate in the 20s, requiring 2 to 4 L supplemental oxygen.  Lab workup included BMP with sodium 132, potassium 3.0, chloride 84, bicarb 35, BUN 51, creatinine stable at 1.51, glucose 174, calcium  8.6.  CBC with hemoglobin of 9.2 which is near baseline of 10.  proBNP elevated to 3670.  Troponin mildly elevated at 49, repeat pending.  Respiratory panel for flu COVID and RSV negative.  Exam pending.  Chest x-ray showed pulmonary edema and right sided moderate pleural effusion.  Patient started on Lasix  infusion, dobutamine  infusion, oxycodone .  Received 2 g IV magnesium , 50 mEq IV potassium, 40 mEq p.o. potassium.  Heart failure team is aware of admission and are following.  Review of Systems: As per HPI otherwise all other systems reviewed and are negative.  Past Medical History:  Diagnosis Date    Acute myocardial infarction of other anterior wall, subsequent episode of care 12/12/2011   Anal fissure    Arthritis    back   Atherosclerosis of native artery of both lower extremities with intermittent claudication 04/01/2018   Bruises easily    d/t being on Effient    CAD (coronary artery disease)    A. Acute Ant STEMI 12/12/2011    Cardiomyopathy secondary 12/26/2011   Chronic systolic CHF (congestive heart failure) (HCC)    ischemic CM // Echo 6/21: EF 30-35  //  Echocardiogram 10/21: LV apical thrombus resolved, EF 50-55, ant and apical, ant-lat HK, trivial MR, trivial AI   Chronic total occlusion of artery of the extremities 02/26/2012   Claudication 12/26/2011   Coronary atherosclerosis of native coronary artery    a. ant STEMI with cardiac arrest 2013 s/p DES to mLAD. b. [place]  10/2016 s/p PTCA to mLAD.   Diabetes mellitus without complication (HCC)    Essential hypertension 10/23/2016   GERD (gastroesophageal reflux disease)    takes tums   History of blood transfusion    no abnormal reaction noted   History of kidney stones    Hypertension    Ischemic cardiomyopathy    a. EF 35% in 02/2012 at time of acute MI, improved to normal on subsequent imaging.   MI (myocardial infarction) (HCC)    AMI 1/13 - complicated by VT/Tosades   Mixed hyperlipidemia    NSTEMI (non-ST elevation myocardial infarction) (HCC) 03/26/2023   Numbness and tingling of right arm 03/31/2013   Occlusion and stenosis of carotid artery without mention of cerebral infarction 08/26/2012   PAD (peripheral artery disease)    followed by Vascular - aortobifem  bypass 01/2013 & left carotid-subclavian artery bypass 04/2013   Paroxysmal atrial flutter (HCC) 07/20/2024   Peripheral vascular disease, unspecified 12/16/2012   PVD (peripheral vascular disease) 03/31/2013   Rectal polyp    ST elevation myocardial infarction involving left anterior descending (LAD) coronary artery (HCC) 07/31/2017   Subclavian steal  syndrome 08/11/2013   Syncope 07/20/2024   Uncontrolled diabetes mellitus 10/23/2016   Unstable angina (HCC)    Wears partial dentures    upper    Past Surgical History:  Procedure Laterality Date   A-FLUTTER ABLATION N/A 05/05/2024   Procedure: A-FLUTTER ABLATION;  Surgeon: [name], MD;  Location: [facility] INVASIVE CV LAB;  Service: Cardiovascular;  Laterality: N/A;   ABDOMINAL AORTOGRAM W/LOWER EXTREMITY N/A 06/26/2017   Procedure: Abdominal Aortogram w/Lower Extremity;  Surgeon: Serene Gaile ORN, MD;  Location: [facility] INVASIVE CV LAB;  Service: Cardiovascular;  Laterality: N/A;   ABDOMINAL AORTOGRAM W/LOWER EXTREMITY N/A 06/28/2021   Procedure: ABDOMINAL AORTOGRAM W/LOWER EXTREMITY;  Surgeon: Serene Gaile ORN, MD;  Location: [facility] INVASIVE CV LAB;  Service: Cardiovascular;  Laterality: N/A;   ABDOMINAL AORTOGRAM W/LOWER EXTREMITY N/A 01/02/2023   Procedure: ABDOMINAL AORTOGRAM W/LOWER EXTREMITY;  Surgeon: Serene Gaile ORN, MD;  Location: [facility] INVASIVE CV LAB;  Service: Cardiovascular;  Laterality: N/A;   ANAL FISSURECTOMY     AORTA - BILATERAL FEMORAL ARTERY BYPASS GRAFT N/A 01/16/2013   Procedure: AORTA BIFEMORAL BYPASS GRAFT;  Surgeon: Gaile ORN Serene, MD;  Location: [facility] OR;  Service: Vascular;  Laterality: N/A;   APPENDECTOMY     ATRIAL FIBRILLATION ABLATION N/A 11/12/2024   Procedure: ATRIAL FIBRILLATION ABLATION;  Surgeon: [name], MD;  Location: [facility] INVASIVE CV LAB;  Service: Cardiovascular;  Laterality: N/A;   CARDIAC CATHETERIZATION N/A 10/23/2016   Procedure: Left Heart Cath and Coronary Angiography;  Surgeon: Lonni JONETTA Cash, MD;  Location[facility] INVASIVE CV LAB;  Service: Cardiovascular;  Laterality: N/A;   CARDIAC CATHETERIZATION N/A 10/23/2016   Procedure: Coronary Balloon Angioplasty;  Surgeon: Lonni JONETTA Cash, MD;  Location: [facility] INVASIVE CV LAB;  Service: Cardiovascular;  Laterality: N/A;   CARDIOVERSION N/A 03/31/2024   Procedure: CARDIOVERSION;  Surgeon: Zenaida Morene PARAS, MD;  Location: [facility] INVASIVE CV LAB;  Service: Cardiovascular;  Laterality: N/A;   CAROTID-SUBCLAVIAN BYPASS GRAFT Left 04/03/2013   Procedure: BYPASS GRAFT CAROTID-SUBCLAVIAN;  Surgeon: Gaile ORN Serene, MD;  Location: [facility] OR;  Service: Vascular;  Laterality: Left;   CIRCUMCISION N/A 12/15/2015   Procedure: CIRCUMCISION ADULT;  Surgeon: Belvie LITTIE Clara, MD;  Location: [facility] ORS;  Service: Urology;  Laterality: N/A;   COLONOSCOPY     CORONARY ANGIOPLASTY     stent placed Dec 12, 2011 and 2018   CORONARY ARTERY BYPASS GRAFT N/A 05/05/2020   Procedure: CORONARY ARTERY BYPASS GRAFTING (CABG) x Three, Using Left internal Mammary Artery and Left Leg greater saphenous vein harvested endoscopically;  Surgeon: Army Dallas NOVAK, MD;  Location: [facility] OR;  Service: Open Heart Surgery;  Laterality: N/A;   CORONARY BALLOON ANGIOPLASTY N/A 05/23/2019   Procedure: CORONARY BALLOON ANGIOPLASTY;  Surgeon: Cash Lonni JONETTA, MD;  Location: [facility] INVASIVE CV LAB;  Service: Cardiovascular;  Laterality: N/A;   CORONARY PRESSURE/FFR STUDY N/A 04/22/2020   Procedure: INTRAVASCULAR PRESSURE WIRE/FFR STUDY;  Surgeon: Cash Lonni JONETTA, MD;  Location: [facility] INVASIVE CV LAB;  Service: Cardiovascular;  Laterality: N/A;   DIAGNOSTIC LAPAROSCOPY     ENDARTERECTOMY FEMORAL Right 03/13/2018   Procedure: FEMORAL ENDARTERECTOMY RIGHT;  Surgeon: Serene Gaile ORN, MD;  Location: [facility] OR;  Service: Vascular;  Laterality: Right;   ENDARTERECTOMY FEMORAL Left 01/26/2023   Procedure: REDO LEFT COMMON FEMORAL ENDARTERECTOMY WITH VPA;  Surgeon: Serene Gaile ORN, MD;  Location: [facility] OR;  Service: Vascular;  Laterality: Left;   FEMORAL-POPLITEAL BYPASS GRAFT Right 03/13/2018   Procedure: FEMORAL-POPLITEAL ARTERY BYPASS WITH NON-REVERSE VEIN RIGHT;  Surgeon: Serene Gaile ORN, MD;  Location: [facility] OR;  Service: Vascular;  Laterality: Right;   GROIN DISSECTION Right 03/13/2018   Procedure: RE-DO COMMON FEMORAL ARTERY EXPOSURE;  Surgeon: Serene Gaile ORN, MD;  Location: [facility] OR;  Service: Vascular;  Laterality: Right;   I & D EXTREMITY Left 04/18/2013   Procedure: debridement of left neck lymphocele;  Surgeon: Gaile ORN Serene, MD;  Location: [facility] OR;  Service: Vascular;  Laterality: Left;  I and D of left neck   I & D EXTREMITY Right 11/21/2014   Procedure: IRRIGATION AND DEBRIDEMENT RIGHT HAND;  Surgeon: Elsie Mussel, MD;  Location: [facility] OR;  Service: Orthopedics;  Laterality: Right;   LEFT HEART CATH AND CORONARY ANGIOGRAPHY N/A 05/23/2019   Procedure: LEFT HEART CATH AND CORONARY ANGIOGRAPHY;  Surgeon: Verlin Lonni BIRCH, MD;  [facility]: [facility] INVASIVE CV LAB;  Service: Cardiovascular;  Laterality: N/A;   LEFT HEART CATH AND CORONARY ANGIOGRAPHY N/A 04/22/2020   Procedure: LEFT HEART CATH AND CORONARY ANGIOGRAPHY;  Surgeon: Verlin Lonni BIRCH, MD;  Location: [facility] INVASIVE CV LAB;  Service: Cardiovascular;  Laterality: N/A;   LEFT HEART CATHETERIZATION WITH CORONARY ANGIOGRAM N/A 12/12/2011   Procedure: LEFT HEART CATHETERIZATION WITH CORONARY ANGIOGRAM;  Surgeon: Lonni BIRCH Verlin, MD;  Location: [facility] CATH LAB;  Service: Cardiovascular;  Laterality: N/A;   LOOP RECORDER INSERTION N/A 05/05/2024   Procedure: LOOP RECORDER INSERTION;  Surgeon: [name], MD;  Location: [facility] INVASIVE CV LAB;  Service: Cardiovascular;  Laterality: N/A;   LOWER EXTREMITY ANGIOGRAM N/A 01/31/2012   Procedure: LOWER EXTREMITY ANGIOGRAM;  Surgeon: Lonni BIRCH Verlin, MD;  Location: [facility] CATH LAB;  Service: Cardiovascular;  Laterality: N/A;   LOWER EXTREMITY ANGIOGRAPHY N/A 04/23/2018   Procedure: LOWER EXTREMITY ANGIOGRAPHY;  Surgeon: Serene Gaile ORN, MD;  [facility] INVAS[facility] CV LAB;  Service: Cardiovascular;  Laterality: N/A;   PATCH ANGIOPLASTY Left 01/26/2023   Procedure: PATCH ANGIOPLASTY USING HEMASHIELD PLATINUM FINESSE;  Surgeon: Serene Gaile ORN, MD;  Location: [facility] OR;  Service: Vascular;  Laterality: Left;   PERCUTANEOUS CORONARY STENT INTERVENTION  (PCI-S) Right 12/12/2011   Procedure: PERCUTANEOUS CORONARY STENT INTERVENTION (PCI-S);  Surgeon: Lonni BIRCH Verlin, MD;  Location: [facility] CATH LAB;  Service: Cardiovascular;  Laterality: Right;   PERIPHERAL VASCULAR BALLOON ANGIOPLASTY Right 06/28/2021   Procedure: PERIPHERAL VASCULAR BALLOON ANGIOPLASTY;  Surgeon: Serene Gaile ORN, MD;  Location: [facility] INVASIVE CV LAB;  Service: Cardiovascular;  Laterality: Right;  common femoral (graft)   PERIPHERAL VASCULAR BALLOON ANGIOPLASTY  01/02/2023   Procedure: PERIPHERAL VASCULAR BALLOON ANGIOPLASTY;  Surgeon: Serene Gaile ORN, MD;  Location: [facility] INVASIVE CV LAB;  Service: Cardiovascular;;   PERIPHERAL VASCULAR INTERVENTION Left 04/23/2018   Procedure: PERIPHERAL VASCULAR INTERVENTION;  Surgeon: Serene Gaile ORN, MD;  Location: [facility] INVASIVE CV LAB;  Service: Cardiovascular;  Laterality: Left;  superficial femoral   REPAIR EXTENSOR TENDON Right 11/21/2014   Procedure: WITH REPAIR/RECONSTRUCTION OF EXTENSOR TENDONS AS NEEDED;  Surgeon: Elsie Mussel, MD;  Location: [facility] OR;  Service: Orthopedics;  Laterality: Right;   TEE WITHOUT CARDIOVERSION N/A 05/05/2020   Procedure: TRANSESOPHAGEAL ECHOCARDIOGRAM (TEE);  Surgeon: Army Dallas NOVAK, MD;  Location: [facility] OR;  Service: Open Heart Surgery;  Laterality: N/A;    Social History  reports that he has quit smoking. His smoking use included cigarettes. He has a 6.3 pack-year smoking history. He has never used smokeless tobacco. He reports current alcohol use of about 2.0 standard drinks of alcohol per week. He reports that he does not use drugs.  Allergies[1]  Family History  Problem Relation Age of Onset   Cancer Father        Lung   Diabetes Mother    COPD Mother    Cancer Maternal Uncle        Colon  Reviewed on admission  Prior to Admission medications  Medication Sig Start Date End Date Taking? Authorizing Provider  ACCU-CHEK GUIDE TEST test strip  12/01/24  Yes [provider]  ADVOCATE INSULIN   SYRINGE 31G X 5/16 1 ML MISC  12/01/24  Yes [provider]  GNP PEN NEEDLES 32G X 4 MM MISC  12/01/24  Yes [provider]  insulin  lispro (HUMALOG ) 100 UNIT/ML injection  12/01/24  Yes [provider]  albuterol  (VENTOLIN  HFA) 108 (90 Base) MCG/ACT inhaler Inhale 1-2 puffs into the lungs every 6 (six) hours as needed for wheezing or shortness of breath.    [provider]  cetirizine (ZYRTEC) 10 MG tablet Take 10 mg by mouth daily as needed for allergies. 06/02/21   [provider]  clopidogrel  (PLAVIX ) 75 MG tablet TAKE 1 TABLET BY MOUTH EVERY DAY 09/01/24   Verlin Lonni BIRCH, MD  diazepam  (VALIUM ) 10 MG tablet Take 10 mg by mouth at bedtime. 03/10/21   [provider]  ELIQUIS  5 MG TABS tablet TAKE 1 TABLET BY MOUTH 2 TIMES A DAY 09/29/24   [name], MD  ezetimibe  (ZETIA ) 10 MG tablet TAKE 1 TABLET BY MOUTH EACH DAY 03/07/24   Lelon Hamilton T, PA-C  Furosemide  (FUROSCIX ) 80 MG/10ML CTKT Inject 80 mg into the skin once daily as directed. 12/15/24   Milford, [name], FNP  gabapentin  (NEURONTIN ) 300 MG capsule Take 300 mg by mouth 2 (two) times daily. 03/25/20   [provider]  icosapent  Ethyl (VASCEPA ) 1 g capsule TAKE 2 CAPSULES BY MOUTH TWICE DAILY 05/30/24   Lelon Hamilton T, PA-C  insulin  glargine (LANTUS ) 100 UNIT/ML Solostar Pen Inject 50 Units into the skin 2 (two) times daily.    [provider]  insulin  lispro (HUMALOG ) 100 UNIT/ML injection Inject 5-15 Units into the skin 3 (three) times daily as needed (blood sugar over 150).    [provider]  methocarbamol  (ROBAXIN ) 500 MG tablet Take 500 mg by mouth every 6 (six) hours as needed for muscle spasms. 11/05/24   [provider]  metolazone  (ZAROXOLYN ) 5 MG tablet Take 1 tablet (5 mg total) by mouth daily. 12/15/24   Milford, [name], FNP  nitroGLYCERIN  (NITROSTAT ) 0.4 MG SL tablet Place 0.4 mg under the tongue every 5 (five) minutes as needed  for chest pain.    [provider]  oxyCODONE -acetaminophen  (PERCOCET) 10-325 MG tablet Take 1 tablet by mouth 4 (four) times daily.    [provider]  OXYGEN Inhale 2 L into the lungs as needed (excessive movement).    [provider]  pantoprazole  (PROTONIX ) 40 MG tablet Take 40 mg by mouth daily. 03/07/24   [provider]  potassium chloride  SA (KLOR-CON  M) 20 MEQ tablet Take 3 tablets (60 mEq total) by mouth 2 (two) times daily. Take an extra 2 tablets (40 meq total) when you take Metolazone .  12/01/24   Glena [name], FNP  ramelteon  (ROZEREM ) 8 MG tablet TAKE 1 TABLET (8 MG TOTAL) BY MOUTH AT BEDTIME. 12/01/24   Olalere, [name], MD  REPATHA  SURECLICK 140 MG/ML SOAJ INJECT 140 MG INTO THE SKIN EVERY 14 DAYS AS DIRECTED 05/30/24   [name], MD  Tiotropium Bromide-Olodaterol (STIOLTO RESPIMAT ) 2.5-2.5 MCG/ACT AERS INHALE 2 PUFFS INTO THE LUNGS DAILY. 12/01/24   Olalere, Jennet LABOR, MD  torsemide  (DEMADEX ) 20 MG tablet Take 4 tablets (80 mg total) by mouth 2 (two) times daily. 11/28/24   Clegg, [name], NP  traZODone  (DESYREL ) 100 MG tablet Take 100 mg by mouth at bedtime. Patient not taking: Reported on 12/18/2024    [provider]  TRINTELLIX  20 MG TABS tablet Take 20 mg by mouth daily in the afternoon. 12/29/22   [provider]  Vitamin D, Ergocalciferol, (DRISDOL) 1.25 MG (50000 UNIT) CAPS capsule Take 50,000 Units by mouth every Saturday. Patient not taking: Reported on 12/18/2024 04/01/24   [provider]    Physical Exam: Vitals:   12/18/24 1300 12/18/24 1415 12/18/24 1445 12/18/24 1556  BP:  (!) 82/59 99/69   Pulse:  85 89   Resp:  (!) 28 (!) 28   Temp: 98 F (36.7 C)     TempSrc: Oral     SpO2:  99% 95% 97%  Weight:      Height:        Physical Exam Constitutional:      General: He is not in acute distress.    Appearance: Normal appearance. He is obese.  HENT:     Head: Normocephalic and atraumatic.      Mouth/Throat:     Mouth: Mucous membranes are moist.     Pharynx: Oropharynx is clear.  Eyes:     Extraocular Movements: Extraocular movements intact.     Pupils: Pupils are equal, round, and reactive to light.  Cardiovascular:     Rate and Rhythm: Normal rate and regular rhythm.     Pulses: Normal pulses.     Heart sounds: Normal heart sounds.  Pulmonary:     Effort: Pulmonary effort is normal. No respiratory distress.     Breath sounds: Rales present.  Abdominal:     General: Bowel sounds are normal. There is no distension.     Palpations: Abdomen is soft.     Tenderness: There is no abdominal tenderness.  Musculoskeletal:        General: No swelling or deformity.     Right lower leg: Edema present.     Left lower leg: Edema present.  Skin:    General: Skin is warm and dry.  Neurological:     General: No focal deficit present.     Mental Status: Mental status is at baseline.    Labs on Admission: I have personally reviewed following labs and imaging studies  CBC: Recent Labs  Lab 12/18/24 1237  WBC 8.0  HGB 9.2*  HCT 31.2*  MCV 76.5*  PLT 241    Basic Metabolic Panel: Recent Labs  Lab 12/18/24 1237  NA 132*  K 3.0*  CL 84*  CO2 35*  GLUCOSE 174*  BUN 51*  CREATININE 1.51*  CALCIUM  8.6*    GFR: Estimated Creatinine Clearance: 62.8 mL/min (A) (by C-G formula based on SCr of 1.51 mg/dL (H)).  Liver Function Tests: No results for input(s): AST, ALT, ALKPHOS, BILITOT, PROT, ALBUMIN  in the last 168 hours.  Urine analysis:    Component  Value Date/Time   COLORURINE YELLOW 03/01/2024 0625   APPEARANCEUR CLEAR 03/01/2024 0625   LABSPEC 1.009 03/01/2024 0625   PHURINE 6.0 03/01/2024 0625   GLUCOSEU >=500 (A) 03/01/2024 0625   HGBUR NEGATIVE 03/01/2024 0625   BILIRUBINUR NEGATIVE 03/01/2024 0625   KETONESUR NEGATIVE 03/01/2024 0625   PROTEINUR 100 (A) 03/01/2024 0625   UROBILINOGEN 0.2 04/04/2013 0520   NITRITE NEGATIVE 03/01/2024 0625    LEUKOCYTESUR NEGATIVE 03/01/2024 0625    Radiological Exams on Admission: DG Chest 2 View Result Date: 12/18/2024 EXAM: 2 VIEW(S) XRAY OF THE CHEST 12/18/2024 01:31:00 PM COMPARISON: 11/17/2024 CLINICAL HISTORY: sob FINDINGS: LINES, TUBES AND DEVICES: Left chest loop recorder noted. LUNGS AND PLEURA: Mild pulmonary edema. Moderate right pleural effusion with adjacent right basilar atelectasis. Trace left pleural effusion. No pneumothorax. HEART AND MEDIASTINUM: Stable cardiomediastinal silhouette with post-sternotomy and CABG changes. Similar cardiomegaly. BONES AND SOFT TISSUES: Similar discontinuity of sternotomy wires. IMPRESSION: 1. Mild pulmonary edema with moderate right pleural effusion and adjacent right basilar atelectasis. 2. Trace left pleural effusion. Electronically signed by: Waddell Calk MD 12/18/2024 01:51 PM EST RP Workstation: [id number]   EKG: Independently reviewed.  Sinus rhythm at 92 bpm.  Nonspecific T wave changes.  Significant baseline wander limiting interpretation.  Assessment/Plan Principal Problem:   Acute on chronic combined systolic and diastolic CHF (congestive heart failure) (HCC) Active Problems:   Paroxysmal atrial fibrillation (HCC)   CAD (coronary artery disease)   Chronic kidney disease, stage 3a (HCC)   Essential hypertension   Type 2 diabetes mellitus with complication, with long-term current use of insulin  (HCC)   PAD (peripheral artery disease)   Centrilobular emphysema (HCC)   Obesity, class 1   Carotid artery disease   PVD (peripheral vascular disease)   S/P CABG x 3   Iron  deficiency anemia   Chronic respiratory failure with hypoxia (HCC)   Hyperlipidemia   Acute on chronic combined systolic and diastolic CHF > Last echo was in August with EF 45%, G2 DD, global hypokinesis, low normal RV systolic function. > Follows with advanced heart failure team and recently has had continued shortness of breath and edema with minimal urine output despite  Furoscix  and metolazone . > Sent from advanced heart failure clinic for IV diuresis with inotrope support. > Advanced heart failure team aware of patient's admission and will be following.  Patient started on Lasix  infusion and dobutamine  infusion in the ED.  Advanced heart failure team confirms dobutamine  will be able to be given on progressive unit. - Monitor on progressive unit - Appreciate advanced heart failure team recommendations and assistance - Continue with Lasix  infusion - Continue with dobutamine  infusion (per heart failure will hold until coox) - Place PICC, trend Coox - Trend magnesium  and potassium, replete as needed - Trend renal function - Strict I's and O's, daily weights - Repeat echocardiogram - Continue with acetazolamide   Chronic respiratory failure with hypoxia > On 2 to 2-1/2 L at home.  On 2 to 3 L in the ED. - Continue supplemental oxygen  Emphysema - Replace home Stiolto with formulary Brovana  and Incruse - Continue as needed albuterol   Hypertension - Continue diuresis as above  Hyperlipidemia - Continue home Zetia  and Vascepa  - Takes Repatha  outpatient  CAD: Status post CABG PAD: Status post aortobifem bypass and left subclavian bypass Carotid artery disease - Continue home Eliquis , Plavix  - Continue Vascepa , Zetia  - Takes Repatha  outpatient  Atrial fibrillation - Continue home Eliquis   Diabetes > 50 units long-acting insulin  twice daily  and SSI outpatient - 25 units twice daily long-acting insulin  - SSI   Obesity - Noted  Anemia > Hemoglobin at 9.2 was near baseline of 10.  In the setting of volume overload likely delusional. - Trend CBC  DVT prophylaxis: Eliquis  Code Status:   Full Family Communication:  None on admission  Disposition Plan:   Patient is from:  Home/advanced heart failure clinic  Anticipated DC to:  Home  Anticipated DC date:  3 to 7 days  Anticipated DC barriers: None  Consults called:  Advanced heart  failure Admission status:  Inpatient, progressive  Severity of Illness: The appropriate patient status for this patient is INPATIENT. Inpatient status is judged to be reasonable and necessary in order to provide the required intensity of service to ensure the patient's safety. The patient's presenting symptoms, physical exam findings, and initial radiographic and laboratory data in the context of their chronic comorbidities is felt to place them at high risk for further clinical deterioration. Furthermore, it is not anticipated that the patient will be medically stable for discharge from the hospital within 2 midnights of admission.   * I certify that at the point of admission it is my clinical judgment that the patient will require inpatient hospital care spanning beyond 2 midnights from the point of admission due to high intensity of service, high risk for further deterioration and high frequency of surveillance required.Ryan Marsa KATHEE Seena MD [facility]  How to contact the [facility] Attending or Consulting provider 7A - 7P or covering provider during after hours 7P -7A, for this patient?   Check the care team in [facility] and look for a) attending/consulting [facility] provider listed and b) the [facility] team listed Log into [email or web address] and use [place]'s universal password to access. If you do not have the password, please contact the hospital operator. Locate the [facility] provider you are looking for under [facility] and page to a number that you can be directly reached. If you still have difficulty reaching the provider, please page the [facility] (Director on Call) for the Hospitalists listed on amion for assistance.  12/18/2024, 4:09 PM       [1]  Allergies Allergen Reactions   Metformin Anaphylaxis    Water  blisters between fingers   Hydrocodone  Hives and Nausea And Vomiting   "
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
## 2024-12-18 NOTE — Progress Notes (Signed)
 Orthopedic Tech Progress Note Patient Details:  Jayquon Theiler 1966/04/07 [id number] Went to apply UB but sterile procedure was in progress. Note on the door stated not to open at this time.  Patient ID: Silus Lanzo, male   DOB: 1966-09-29, 58 y.o.   MRN: [id number]  Giovanni LITTIE Lukes 12/18/2024, 7:35 PM

## 2024-12-18 NOTE — ED Notes (Signed)
 Seena MD contacted via secure chat for irregularity and pvcs on monitor.

## 2024-12-18 NOTE — Patient Instructions (Addendum)
" °  VISIT SUMMARY: During your follow-up visit, we discussed your ongoing issues with fluid retention, sleep difficulties, and respiratory health. We reviewed your current medications and oxygen therapy, and we have made some adjustments to help manage your symptoms better.  YOUR PLAN: -CHRONIC RESPIRATORY FAILURE WITH HYPOXIA: Chronic respiratory failure with hypoxia means your body is not getting enough oxygen due to multiple factors including heart failure, atrial fibrillation, and emphysema. Continue using oxygen therapy at 2-3 liters, adjusting based on tubing length and oxygen saturation. We have ordered a chest x-ray to check for fluid around your lungs and will consider a procedure to remove the fluid if necessary. Keep using your Stiolto and albuterol  inhalers and monitor your oxygen levels with a pulse oximeter, aiming for 90-92%. Adjust the oxygen flow if you are using multiple tubing connections.  -CENTRILOBULAR EMPHYSEMA: Centrilobular emphysema is a type of chronic lung disease that damages the air sacs in your lungs. Continue using your Stiolto and albuterol  inhalers as prescribed. We will review the results of your pulmonary function tests scheduled for March to assess your lung function further.  -INSOMNIA: Insomnia is difficulty falling or staying asleep. You have tried trazodone  and Rozerem  without significant improvement. We have discontinued trazodone  due to its side effects and will continue Rozerem  until further evaluation. We have consulted with a sleep specialist to consider alternative sleep aids like belsomra or doxepin. Elevate your head while sleeping to improve sleep quality.  INSTRUCTIONS: Please follow up with the chest x-ray as ordered. Continue monitoring your oxygen saturation at home and adjust the oxygen flow as needed. We will review the results of your pulmonary function tests in March. If your sleep issues persist, we will explore alternative sleep aids as  discussed.  Orders: CXR today New script for oxygen sent to [facility] 2-3L continuous and humidification   Follow-up:  Dr. Neda in 4-6 weeks   "

## 2024-12-18 NOTE — Progress Notes (Signed)
 "  @Patient  ID: Ryan [name], male    DOB: 10-Oct-1966, 59 y.o.   MRN: [id number]  Chief Complaint  Patient presents with   Shortness of Breath    Increased SOB w/exertion, hard time even getting to bathroom,cough-tan, wheezing, has passed out with coughing at times, cough worse at bedtime, nose blisters on left nostril from smoking with oxygen on 1 wk. ago    Referring provider: [name], MD  HPI: 59 year old male, active smoker followed for emphysema and chronic respiratory failure. Past medical history significant for STEMI, PVD, PAF, HFrEF, cardiomyopathy, CAD s/p CABG, DM, CKD, ventricular apical thrombus. Patient of Dr. Neda.  TEST/EVENTS:  07/17/2024 CXR: cardiomegaly with small b/l effusions, similar to cardiac CT in May 10/23/2024 PSG: negative sleep study; AHI 3.9, SpO2 low 87% >> continue 2 lpm supplemental O2  07/31/2024: OV with Dr. Neda. Sleep disordered breathing, insomnia. Difficulty falling asleep. Active smoker. Recent hospitalizations for hypoxemic respiratory failure and decompensated HF. 4-5 night awakenings. Activity limitation about 2 blocks or less. Recently started on supplemental oxygen in the hospital. On diuretics, off Entresto , off SGLT for CHF. Trial rozerem  for sleep. Schedule for in lab study. Emphysema on CT chest. Schedule PFT, consider pulmonary rehab. Stiolto ordered. Smoking cessation advised.   12/8/2025[name], NP Discussed the use of AI scribe software for clinical note transcription with the patient, who gave verbal consent to proceed.  History of Present Illness Ryan [name] is a 59 year old male with chronic respiratory failure and emphysema who presents for follow up.   He has been on oxygen for a few months now. He's been using it consistently. It does dry out his nose so he is wanting a humidifier. He feels better with the oxygen. Currently using 2lpm but sometimes still has oxygen levels in the 70's on this when he checks at home. He's  wanting to switch DME companies and is also needing something more portable.   He is scheduled for an ablation procedure to address his atrial fibrillation Wednesday, 12/10. He has been feeling a little more swollen recently, especially in his mid section. His legs don't feel that swollen. He denies orthopnea, PND, palpitations, CP. He is currently taking diuretics, one in the morning and one at night. He was told he should take an extra pill if he's having swelling or weight gain, but he hasn't done this the last few days.   In terms of respiratory symptoms, he has a cough with minimal sputum production, which is chronic. He reports that his breathing has been about the same as it has been over the last few months. No increased shortness of breath. His sleep study did not show significant sleep apnea, and he continues to use oxygen therapy at night; was well managed on 2 lpm at night. No hemoptysis, weight changes, anorexia, chest congestion, wheezing.     12/18/2024- Interim hx  Discussed the use of AI scribe software for clinical note transcription with the patient, who gave verbal consent to proceed.  History of Present Illness Ryan [name] is a 59 year old male with emphysema, chronic respiratory failure, and heart failure who presents for a follow-up visit.  He has a history of emphysema, chronic respiratory failure, paroxysmal atrial fibrillation, and heart failure. He underwent an ablation for atrial fibrillation in December. Currently, he is experiencing issues with fluid retention, leading to frequent adjustments in his diuretic regimen with heart failure clinic. He was on a three-day course of subcutaneous Lasix  (80  mg) and is now back on torsemide  (80 mg twice a day). His potassium supplementation was increased to 40 mEq daily during the Lasix  course. Despite these adjustments, he has experienced weight gain.  He is on Stiolto and albuterol  inhalers for his emphysema and uses Stiolto  daily. He experiences a dry cough, particularly at night, and has difficulty sleeping. He uses two liters of oxygen and has recently invested in a hospital bed to help with sleep. He reports moderate to severe snoring and poor sleep efficiency during sleep.  He underwent a sleep study on November 20th, which was negative for sleep-disordered breathing, with an apnea-hypopnea index of 3.9. He reports poor sleep quality and has tried trazodone  and Rozerem  without significant improvement. He has stopped trazodone  due to adverse effects and continues Rozerem , but reports that it does not help his sleep.  He experiences episodes of dizziness and falls, particularly when not using oxygen. He uses a long oxygen cord at home and increases the oxygen flow to three liters when using extended tubing. He has a shower chair and uses a long cord for oxygen while showering.  He reports a cough rated as 3 out of 5, with no phlegm production. He is unable to walk up hills. He is limited in his activities at home and has confidence issues leaving the house due to his lung condition. He rates his energy level as low, feeling fine when sitting but fatigued with activity.   Allergies[1]  Immunization History  Administered Date(s) Administered   Influenza Split 12/13/2011   Pneumococcal Polysaccharide-23 12/14/2011, 05/11/2020    Past Medical History:  Diagnosis Date   Acute myocardial infarction of other anterior wall, subsequent episode of care 12/12/2011   Anal fissure    Arthritis    back   Atherosclerosis of native artery of both lower extremities with intermittent claudication 04/01/2018   Bruises easily    d/t being on Effient    CAD (coronary artery disease)    A. Acute Ant STEMI 12/12/2011    Cardiomyopathy secondary 12/26/2011   Chronic systolic CHF (congestive heart failure) (HCC)    ischemic CM // Echo 6/21: EF 30-35  //  Echocardiogram 10/21: LV apical thrombus resolved, EF 50-55, ant and apical,  ant-lat HK, trivial MR, trivial AI   Chronic total occlusion of artery of the extremities 02/26/2012   Claudication 12/26/2011   Coronary atherosclerosis of native coronary artery    a. ant STEMI with cardiac arrest 2013 s/p DES to mLAD. b. [place]  10/2016 s/p PTCA to mLAD.   Diabetes mellitus without complication (HCC)    Essential hypertension 10/23/2016   GERD (gastroesophageal reflux disease)    takes tums   History of blood transfusion    no abnormal reaction noted   History of kidney stones    Hypertension    Ischemic cardiomyopathy    a. EF 35% in 02/2012 at time of acute MI, improved to normal on subsequent imaging.   MI (myocardial infarction) (HCC)    AMI 1/13 - complicated by VT/Tosades   Mixed hyperlipidemia    Numbness and tingling of right arm 03/31/2013   Occlusion and stenosis of carotid artery without mention of cerebral infarction 08/26/2012   PAD (peripheral artery disease)    followed by Vascular - aortobifem bypass 01/2013 & left carotid-subclavian artery bypass 04/2013   Peripheral vascular disease, unspecified 12/16/2012   PVD (peripheral vascular disease) 03/31/2013   Rectal polyp    Subclavian steal syndrome 08/11/2013   Uncontrolled diabetes  mellitus 10/23/2016   Wears partial dentures    upper    Tobacco History: Tobacco Use History[2] Ready to quit: Not Answered Counseling given: Not Answered Tobacco comments: About 1 a day 11/10/2024 KRD   Outpatient Medications Prior to Visit  Medication Sig Dispense Refill   albuterol  (VENTOLIN  HFA) 108 (90 Base) MCG/ACT inhaler Inhale 1-2 puffs into the lungs every 6 (six) hours as needed for wheezing or shortness of breath.     cetirizine (ZYRTEC) 10 MG tablet Take 10 mg by mouth daily as needed for allergies.     clopidogrel  (PLAVIX ) 75 MG tablet TAKE 1 TABLET BY MOUTH EVERY DAY 15 tablet 0   diazepam  (VALIUM ) 10 MG tablet Take 10 mg by mouth at bedtime.     ELIQUIS  5 MG TABS tablet TAKE 1 TABLET BY MOUTH 2 TIMES A DAY  180 tablet 1   ezetimibe  (ZETIA ) 10 MG tablet TAKE 1 TABLET BY MOUTH EACH DAY 90 tablet 5   gabapentin  (NEURONTIN ) 300 MG capsule Take 300 mg by mouth 2 (two) times daily.     icosapent  Ethyl (VASCEPA ) 1 g capsule TAKE 2 CAPSULES BY MOUTH TWICE DAILY 360 capsule 3   insulin  glargine (LANTUS ) 100 UNIT/ML Solostar Pen Inject 50 Units into the skin 2 (two) times daily.     insulin  lispro (HUMALOG ) 100 UNIT/ML injection Inject 5-15 Units into the skin 3 (three) times daily as needed (blood sugar over 150).     methocarbamol  (ROBAXIN ) 500 MG tablet Take 500 mg by mouth every 6 (six) hours as needed for muscle spasms.     metolazone  (ZAROXOLYN ) 5 MG tablet Take 1 tablet (5 mg total) by mouth daily. 4 tablet 0   nitroGLYCERIN  (NITROSTAT ) 0.4 MG SL tablet Place 0.4 mg under the tongue every 5 (five) minutes as needed for chest pain.     oxyCODONE -acetaminophen  (PERCOCET) 10-325 MG tablet Take 1 tablet by mouth 4 (four) times daily.     OXYGEN Inhale 2 L into the lungs as needed (excessive movement).     pantoprazole  (PROTONIX ) 40 MG tablet Take 40 mg by mouth daily.     potassium chloride  SA (KLOR-CON  M) 20 MEQ tablet Take 3 tablets (60 mEq total) by mouth 2 (two) times daily. Take an extra 2 tablets (40 meq total) when you take Metolazone . 180 tablet 3   ramelteon  (ROZEREM ) 8 MG tablet TAKE 1 TABLET (8 MG TOTAL) BY MOUTH AT BEDTIME. 30 tablet 2   REPATHA  SURECLICK 140 MG/ML SOAJ INJECT 140 MG INTO THE SKIN EVERY 14 DAYS AS DIRECTED 6 mL 3   Tiotropium Bromide-Olodaterol (STIOLTO RESPIMAT ) 2.5-2.5 MCG/ACT AERS INHALE 2 PUFFS INTO THE LUNGS DAILY. 4 g 11   torsemide  (DEMADEX ) 20 MG tablet Take 4 tablets (80 mg total) by mouth 2 (two) times daily. 240 tablet 3   traZODone  (DESYREL ) 100 MG tablet Take 100 mg by mouth at bedtime.     TRINTELLIX  20 MG TABS tablet Take 20 mg by mouth daily in the afternoon.     Furosemide  (FUROSCIX ) 80 MG/10ML CTKT Inject 80 mg into the skin once daily as directed. (Patient not  taking: Reported on 12/18/2024) 5 each 0   Vitamin D, Ergocalciferol, (DRISDOL) 1.25 MG (50000 UNIT) CAPS capsule Take 50,000 Units by mouth every Saturday. (Patient not taking: Reported on 12/18/2024)     No facility-administered medications prior to visit.    Review of Systems  Review of Systems  Constitutional: Negative.   Respiratory: Negative.  Physical Exam  BP 118/62   Pulse 81   Temp 97.6 F (36.4 C) (Oral)   Ht 5' 9 (1.753 m)   Wt 223 lb 3.2 oz (101.2 kg)   SpO2 95% Comment: 2L cont.  BMI 32.96 kg/m  Physical Exam Constitutional:      Appearance: He is well-developed.  HENT:     Head: Normocephalic and atraumatic.  Cardiovascular:     Rate and Rhythm: Rhythm irregular.  Pulmonary:     Effort: Pulmonary effort is normal.     Breath sounds: Examination of the right-middle field reveals rales. Examination of the left-middle field reveals rales. Examination of the right-lower field reveals rales. Examination of the left-lower field reveals rales. Rales present.  Musculoskeletal:        General: Normal range of motion.  Skin:    General: Skin is warm.  Neurological:     General: No focal deficit present.     Mental Status: He is alert and oriented to person, place, and time.  Psychiatric:        Mood and Affect: Mood normal.        Behavior: Behavior normal.      Lab Results:  CBC    Component Value Date/Time   WBC 9.4 11/17/2024 1436   RBC 4.41 11/17/2024 1436   HGB 9.9 (L) 11/17/2024 1436   HGB 10.2 (L) 10/24/2024 1147   HCT 33.7 (L) 11/17/2024 1436   HCT 36.8 (L) 10/24/2024 1147   PLT 224 11/17/2024 1436   PLT 225 10/24/2024 1147   MCV 76.4 (L) 11/17/2024 1436   MCV 81 10/24/2024 1147   MCH 22.4 (L) 11/17/2024 1436   MCHC 29.4 (L) 11/17/2024 1436   RDW 22.0 (H) 11/17/2024 1436   RDW 18.3 (H) 10/24/2024 1147   LYMPHSABS 2.0 03/29/2022 1746   MONOABS 1.0 03/29/2022 1746   EOSABS 0.2 03/29/2022 1746   BASOSABS 0.1 03/29/2022 1746    BMET     Component Value Date/Time   NA 133 (L) 12/05/2024 1106   K 3.5 12/05/2024 1106   CL 81 (L) 12/05/2024 1106   CO2 34 (H) 12/05/2024 1106   GLUCOSE 60 (L) 12/05/2024 1106   GLUCOSE 270 (H) 12/01/2024 1121   BUN 35 (H) 12/05/2024 1106   CREATININE 1.78 (H) 12/05/2024 1106   CREATININE 0.98 10/11/2016 0941   CALCIUM  9.6 12/05/2024 1106   GFRNONAA 49 (L) 12/01/2024 1121   GFRAA 87 06/18/2020 0945    BNP    Component Value Date/Time   BNP 1,251.0 (H) 11/17/2024 1436    ProBNP No results found for: PROBNP  Imaging: CUP PACEART REMOTE DEVICE CHECK Result Date: 12/05/2024 ILR summary report received. Battery status OK. Normal device function. No new tachy, brady, or pause episodes. No new AF episodes. Monthly summary reports and ROV/PRN 1 symptom activation, no associated arrhythmia, SR with PVC's LA, CVRS    Assessment & Plan:   No problem-specific Assessment & Plan notes found for this encounter.   1. Chronic respiratory failure with hypoxia (HCC) (Primary)  2. Centrilobular emphysema (HCC)   Assessment and Plan Assessment & Plan Chronic respiratory failure with hypoxia Chronic respiratory failure is multifactorial, attributed to heart failure, cardiomyopathy, paroxysmal atrial fibrillation, and emphysema. He is on 2 liters of oxygen, with potential need to increase to 3 liters when using longer tubing. Recent in-lab sleep study in November was negative for sleep disordered breathing, but moderate to severe snoring and poor sleep efficiency were noted. Cough is  likely related to fluid retention from heart failure rather than COPD exacerbation. Crackles in lungs suggest fluid retention, but no wheezing or colored mucus production, indicating no COPD exacerbation. - Continue oxygen therapy at 2-3 liters, adjust based on tubing length and oxygen saturation. - Ordered chest x-ray to assess for pleural effusion. - Will consider thoracentesis if moderate-large pleural effusion is  present. - Continue current inhaler regimen with Stiolto and albuterol . - Monitor oxygen saturation with pulse oximeter, aiming for 90-92%. - Adjust oxygen flow if using multiple tubing connections.  Centrilobular emphysema Significant emphysema on imaging. No current exacerbation as indicated by lack of wheezing and colored mucus production. Pulmonary function tests (PFTs) are scheduled for March. - Continue current inhaler regimen with Stiolto and albuterol . - Await results of scheduled pulmonary function tests in March.  Insomnia Chronic insomnia with poor sleep efficiency. Previous trials of trazodone  and Rozerem  were ineffective. Trazodone  caused adverse effects, including grogginess. Sleep study showed moderate to severe snoring and poor sleep efficiency. He is hesitant to use sleep aids due to respiratory failure concerns. - Discontinued trazodone  due to adverse effects. - Continue Rozerem  until further evaluation. - Considering  trial of either belsomra or doxepin which would not worsen underlying sleep apnea  - Advised sleeping with head elevated to improve sleep quality.  I personally spent a total of 40 minutes in the care of the patient today including performing a medically appropriate exam/evaluation, counseling and educating, placing orders, documenting clinical information in the EHR, independently interpreting results, and coordinating care.   Almarie LELON Ferrari, NP 12/18/2024     [1]  Allergies Allergen Reactions   Metformin Anaphylaxis    Water  blisters between fingers   Hydrocodone  Hives and Nausea And Vomiting  [2]  Social History Tobacco Use  Smoking Status Some Days   Current packs/day: 0.25   Average packs/day: 0.3 packs/day for 25.0 years (6.3 ttl pk-yrs)   Types: Cigarettes  Smokeless Tobacco Never  Tobacco Comments   About 1 a day 11/10/2024 KRD   "

## 2024-12-18 NOTE — ED Notes (Signed)
 Patient transported to X-ray

## 2024-12-19 ENCOUNTER — Ambulatory Visit

## 2024-12-19 ENCOUNTER — Inpatient Hospital Stay (HOSPITAL_COMMUNITY)

## 2024-12-19 DIAGNOSIS — I5021 Acute systolic (congestive) heart failure: Secondary | ICD-10-CM | POA: Diagnosis not present

## 2024-12-19 DIAGNOSIS — I5043 Acute on chronic combined systolic (congestive) and diastolic (congestive) heart failure: Secondary | ICD-10-CM | POA: Diagnosis not present

## 2024-12-19 LAB — BASIC METABOLIC PANEL WITH GFR
Anion gap: 11 (ref 5–15)
BUN: 44 mg/dL — ABNORMAL HIGH (ref 6–20)
CO2: 30 mmol/L (ref 22–32)
Calcium: 8.4 mg/dL — ABNORMAL LOW (ref 8.9–10.3)
Chloride: 87 mmol/L — ABNORMAL LOW (ref 98–111)
Creatinine, Ser: 1.61 mg/dL — ABNORMAL HIGH (ref 0.61–1.24)
GFR, Estimated: 49 mL/min — ABNORMAL LOW
Glucose, Bld: 177 mg/dL — ABNORMAL HIGH (ref 70–99)
Potassium: 3.7 mmol/L (ref 3.5–5.1)
Sodium: 127 mmol/L — ABNORMAL LOW (ref 135–145)

## 2024-12-19 LAB — COMPREHENSIVE METABOLIC PANEL WITH GFR
ALT: 15 U/L (ref 0–44)
AST: 26 U/L (ref 15–41)
Albumin: 3.4 g/dL — ABNORMAL LOW (ref 3.5–5.0)
Alkaline Phosphatase: 379 U/L — ABNORMAL HIGH (ref 38–126)
Anion gap: 12 (ref 5–15)
BUN: 48 mg/dL — ABNORMAL HIGH (ref 6–20)
CO2: 33 mmol/L — ABNORMAL HIGH (ref 22–32)
Calcium: 8.4 mg/dL — ABNORMAL LOW (ref 8.9–10.3)
Chloride: 87 mmol/L — ABNORMAL LOW (ref 98–111)
Creatinine, Ser: 1.51 mg/dL — ABNORMAL HIGH (ref 0.61–1.24)
GFR, Estimated: 53 mL/min — ABNORMAL LOW
Glucose, Bld: 225 mg/dL — ABNORMAL HIGH (ref 70–99)
Potassium: 2.9 mmol/L — ABNORMAL LOW (ref 3.5–5.1)
Sodium: 131 mmol/L — ABNORMAL LOW (ref 135–145)
Total Bilirubin: 1.2 mg/dL (ref 0.0–1.2)
Total Protein: 7.2 g/dL (ref 6.5–8.1)

## 2024-12-19 LAB — GLUCOSE, CAPILLARY
Glucose-Capillary: 205 mg/dL — ABNORMAL HIGH (ref 70–99)
Glucose-Capillary: 154 mg/dL — ABNORMAL HIGH (ref 70–99)
Glucose-Capillary: 128 mg/dL — ABNORMAL HIGH (ref 70–99)
Glucose-Capillary: 113 mg/dL — ABNORMAL HIGH (ref 70–99)

## 2024-12-19 LAB — ECHOCARDIOGRAM COMPLETE
AR max vel: 1.42 cm2
AV Area VTI: 1.49 cm2
AV Area mean vel: 1.43 cm2
AV Mean grad: 5.5 mmHg
AV Peak grad: 10.2 mmHg
Ao pk vel: 1.6 m/s
Area-P 1/2: 6.71 cm2
Height: 69 in
MV M vel: 1.76 m/s
MV Peak grad: 12.3 mmHg
MV VTI: 1.39 cm2
S' Lateral: 4 cm
Weight: 3615.54 [oz_av]

## 2024-12-19 LAB — COOXEMETRY PANEL
Carboxyhemoglobin: 2.1 % — ABNORMAL HIGH (ref 0.5–1.5)
Methemoglobin: 1 % (ref 0.0–1.5)
O2 Saturation: 41.3 %
Total hemoglobin: 8 g/dL — ABNORMAL LOW (ref 12.0–16.0)

## 2024-12-19 LAB — RENAL FUNCTION PANEL
Albumin: 3.4 g/dL — ABNORMAL LOW (ref 3.5–5.0)
Anion gap: 12 (ref 5–15)
BUN: 48 mg/dL — ABNORMAL HIGH (ref 6–20)
CO2: 33 mmol/L — ABNORMAL HIGH (ref 22–32)
Calcium: 8.5 mg/dL — ABNORMAL LOW (ref 8.9–10.3)
Chloride: 87 mmol/L — ABNORMAL LOW (ref 98–111)
Creatinine, Ser: 1.5 mg/dL — ABNORMAL HIGH (ref 0.61–1.24)
GFR, Estimated: 54 mL/min — ABNORMAL LOW
Glucose, Bld: 223 mg/dL — ABNORMAL HIGH (ref 70–99)
Phosphorus: 4.3 mg/dL (ref 2.5–4.6)
Potassium: 2.9 mmol/L — ABNORMAL LOW (ref 3.5–5.1)
Sodium: 132 mmol/L — ABNORMAL LOW (ref 135–145)

## 2024-12-19 LAB — CBC
HCT: 28.6 % — ABNORMAL LOW (ref 39.0–52.0)
Hemoglobin: 8.3 g/dL — ABNORMAL LOW (ref 13.0–17.0)
MCH: 22.5 pg — ABNORMAL LOW (ref 26.0–34.0)
MCHC: 29 g/dL — ABNORMAL LOW (ref 30.0–36.0)
MCV: 77.5 fL — ABNORMAL LOW (ref 80.0–100.0)
Platelets: 215 K/uL (ref 150–400)
RBC: 3.69 MIL/uL — ABNORMAL LOW (ref 4.22–5.81)
RDW: 24.8 % — ABNORMAL HIGH (ref 11.5–15.5)
WBC: 7.3 K/uL (ref 4.0–10.5)
nRBC: 0 % (ref 0.0–0.2)

## 2024-12-19 LAB — IRON AND TIBC
Iron: 24 ug/dL — ABNORMAL LOW (ref 45–182)
Saturation Ratios: 6 % — ABNORMAL LOW (ref 17.9–39.5)
TIBC: 406 ug/dL (ref 250–450)
UIBC: 382 ug/dL

## 2024-12-19 LAB — FERRITIN: Ferritin: 179 ng/mL (ref 24–336)

## 2024-12-19 LAB — MAGNESIUM: Magnesium: 2.7 mg/dL — ABNORMAL HIGH (ref 1.7–2.4)

## 2024-12-19 MED ORDER — SODIUM CHLORIDE 0.9% FLUSH
10.0000 mL | INTRAVENOUS | Status: DC | PRN
  Administered 2024-12-19: 10 mL

## 2024-12-19 MED ORDER — INSULIN ASPART 100 UNIT/ML IJ SOLN
3.0000 [IU] | Freq: Three times a day (TID) | INTRAMUSCULAR | Status: DC
  Administered 2024-12-19 – 2024-12-21 (×6): 3 [IU] via SUBCUTANEOUS
  Filled 2024-12-19 (×6): qty 3

## 2024-12-19 MED ORDER — POTASSIUM CHLORIDE 10 MEQ/100ML IV SOLN
10.0000 meq | INTRAVENOUS | Status: DC
  Filled 2024-12-19 (×5): qty 100

## 2024-12-19 MED ORDER — POTASSIUM CHLORIDE 20 MEQ PO PACK
40.0000 meq | PACK | Freq: Once | ORAL | Status: AC
  Administered 2024-12-19: 40 meq via ORAL
  Filled 2024-12-19: qty 2

## 2024-12-19 MED ORDER — POTASSIUM CHLORIDE CRYS ER 20 MEQ PO TBCR
40.0000 meq | EXTENDED_RELEASE_TABLET | ORAL | Status: AC
  Administered 2024-12-19 (×2): 40 meq via ORAL
  Filled 2024-12-19 (×2): qty 2

## 2024-12-19 MED ORDER — POTASSIUM CHLORIDE 10 MEQ/50ML IV SOLN
10.0000 meq | INTRAVENOUS | Status: DC
  Administered 2024-12-19 (×2): 10 meq via INTRAVENOUS
  Filled 2024-12-19 (×4): qty 50

## 2024-12-19 MED ORDER — OXYCODONE HCL 5 MG PO TABS: 5.0000 mg | ORAL_TABLET | Freq: Four times a day (QID) | ORAL | Status: DC | PRN

## 2024-12-19 MED ORDER — ACETAZOLAMIDE SODIUM 500 MG IJ SOLR
500.0000 mg | Freq: Four times a day (QID) | INTRAMUSCULAR | Status: AC
  Administered 2024-12-19 (×2): 500 mg via INTRAVENOUS
  Filled 2024-12-19 (×2): qty 500

## 2024-12-19 MED ORDER — AMIODARONE HCL 200 MG PO TABS
200.0000 mg | ORAL_TABLET | Freq: Two times a day (BID) | ORAL | Status: DC
  Administered 2024-12-19 – 2024-12-20 (×3): 200 mg via ORAL
  Filled 2024-12-19 (×3): qty 1

## 2024-12-19 MED ORDER — INSULIN GLARGINE 100 UNIT/ML ~~LOC~~ SOLN
30.0000 [IU] | Freq: Two times a day (BID) | SUBCUTANEOUS | Status: DC
  Administered 2024-12-19 – 2024-12-20 (×4): 30 [IU] via SUBCUTANEOUS
  Filled 2024-12-19 (×8): qty 0.3

## 2024-12-19 MED ORDER — POTASSIUM CHLORIDE 20 MEQ PO PACK
40.0000 meq | PACK | ORAL | Status: DC
  Administered 2024-12-19: 40 meq via ORAL
  Filled 2024-12-19: qty 2

## 2024-12-19 MED ORDER — OXYCODONE-ACETAMINOPHEN 5-325 MG PO TABS
1.0000 | ORAL_TABLET | Freq: Four times a day (QID) | ORAL | Status: DC | PRN
  Administered 2024-12-19 – 2024-12-20 (×5): 1 via ORAL
  Filled 2024-12-19 (×6): qty 1

## 2024-12-19 MED ORDER — OXYCODONE-ACETAMINOPHEN 5-325 MG PO TABS: 1.0000 | ORAL_TABLET | Freq: Four times a day (QID) | ORAL | Status: DC | PRN

## 2024-12-19 MED ORDER — CHLORHEXIDINE GLUCONATE CLOTH 2 % EX PADS
6.0000 | MEDICATED_PAD | Freq: Every day | CUTANEOUS | Status: DC
  Administered 2024-12-19 – 2024-12-23 (×5): 6 via TOPICAL

## 2024-12-19 MED ORDER — SPIRONOLACTONE 12.5 MG HALF TABLET
12.5000 mg | ORAL_TABLET | Freq: Every day | ORAL | Status: DC
  Administered 2024-12-19 – 2024-12-20 (×2): 12.5 mg via ORAL
  Filled 2024-12-19 (×2): qty 1

## 2024-12-19 MED ORDER — AQUAPHOR EX OINT: 1.0000 | TOPICAL_OINTMENT | Freq: Every day | CUTANEOUS | Status: DC | PRN

## 2024-12-19 MED ORDER — POTASSIUM CHLORIDE CRYS ER 20 MEQ PO TBCR
40.0000 meq | EXTENDED_RELEASE_TABLET | Freq: Once | ORAL | Status: AC
  Administered 2024-12-19: 40 meq via ORAL
  Filled 2024-12-19: qty 2

## 2024-12-19 MED ORDER — SODIUM CHLORIDE 0.9 % IV SOLN
500.0000 mg | Freq: Once | INTRAVENOUS | Status: AC
  Administered 2024-12-19: 500 mg via INTRAVENOUS
  Filled 2024-12-19: qty 25

## 2024-12-19 MED ORDER — OXYCODONE HCL 5 MG PO TABS
5.0000 mg | ORAL_TABLET | Freq: Four times a day (QID) | ORAL | Status: DC | PRN
  Administered 2024-12-19 – 2024-12-20 (×5): 5 mg via ORAL
  Filled 2024-12-19 (×5): qty 1

## 2024-12-19 MED ORDER — POTASSIUM CHLORIDE 10 MEQ/100ML IV SOLN
10.0000 meq | INTRAVENOUS | Status: AC
  Administered 2024-12-19 (×2): 10 meq via INTRAVENOUS
  Filled 2024-12-19 (×3): qty 100

## 2024-12-19 MED ORDER — MILRINONE LACTATE IN DEXTROSE 20-5 MG/100ML-% IV SOLN
0.2500 ug/kg/min | INTRAVENOUS | Status: DC
  Administered 2024-12-19 – 2024-12-20 (×2): 0.25 ug/kg/min via INTRAVENOUS
  Filled 2024-12-19 (×3): qty 100

## 2024-12-19 NOTE — Progress Notes (Signed)
 Orthopedic Tech Progress Note Patient Details:  Ryan [name] [birth year] [id number]  Patient ID: [name], male   DOB: 1966/06/13, 59 y.o.   MRN: [id number] Order received for unna boot application on 1/15 with pt in procedure at time ortho tech reached out for application.  Nursing notified this am via secure chat for application today and await their input.    Charmagne Buhl OTR/L 12/19/2024, 1:49 PM

## 2024-12-19 NOTE — Evaluation (Signed)
 Physical Therapy Evaluation Patient Details Name: Ryan [name] MRN: [id number] DOB: 29-Oct-1966 Today's Date: 12/19/2024  History of Present Illness  Pt is a 59 y.o. M presenting to [facility] on 12/18/24 with SOB and edema. Pt admitted with acute on chronic CHF. Chest XRAY shows pulmonary edema and R moderate pleural effusion. PMH is significant for A-fib, CAD, CKD, HTN, T2DM, obesity, PVD, s/p CABG x3, HLD.   Clinical Impression  Prior to admittance, pt was mobilizing independently and was independent with ADLs. Pt presents to evaluation with deficits in mobility, activity tolerance, and balance. Pt was able to ambulate short distances without AD and no physical assistance, but required several standing rest breaks due to worsening SOB. SpO2 dropped from 90% on 4L to 82% while ambulating, titrated up to 6L with SpO2 continuing to remain low. Encouraged pt to perform PLB with pt reporting difficulty secondary to recently burning his nostrils smoking thus he has been breathing through his mouth more. Pt may benefit from trialing a rollator for activity pacing. PT will continue to treat pt while he is admitted. Recommending HHPT at discharge to address remaining mobility deficits and optimize return to PLOF.       If plan is discharge home, recommend the following: A little help with walking and/or transfers;A little help with bathing/dressing/bathroom;Assistance with cooking/housework;Assist for transportation;Help with stairs or ramp for entrance   Can travel by private vehicle        Equipment Recommendations None recommended by PT  Recommendations for Other Services  OT consult    Functional Status Assessment Patient has had a recent decline in their functional status and demonstrates the ability to make significant improvements in function in a reasonable and predictable amount of time.     Precautions / Restrictions Precautions Precautions: Fall Recall of Precautions/Restrictions:  Intact Restrictions Weight Bearing Restrictions Per Provider Order: No      Mobility  Bed Mobility Overal bed mobility: Modified Independent             General bed mobility comments: Pt completed supine to sit on R side of bed with HOB elevated. Increased time to complete.    Transfers Overall transfer level: Needs assistance Equipment used: None Transfers: Sit to/from Stand Sit to Stand: Supervision           General transfer comment: STS from EOB without AD and no physical assistance. Increased time to complete.    Ambulation/Gait Ambulation/Gait assistance: Contact guard assist Gait Distance (Feet): 50 Feet Assistive device: None Gait Pattern/deviations: Step-through pattern, Decreased stride length, Wide base of support Gait velocity: reduced Gait velocity interpretation: <1.8 ft/sec, indicate of risk for recurrent falls   General Gait Details: Pt demonstrates reciprocal gait pattern with bilateral LEs in ER, displaying increased step width and BOS.  Stairs            Wheelchair Mobility     Tilt Bed    Modified Rankin (Stroke Patients Only)       Balance Overall balance assessment: Needs assistance Sitting-balance support: No upper extremity supported, Feet supported Sitting balance-Leahy Scale: Good Sitting balance - Comments: seated EOB for MMT with no LOB or trunk lean   Standing balance support: No upper extremity supported, During functional activity Standing balance-Leahy Scale: Good Standing balance comment: able to shift weight throughout gait without LOBs                             Pertinent Vitals/Pain Pain  Assessment Pain Assessment: No/denies pain Breathing: occasional labored breathing, short period of hyperventilation    Home Living Family/patient expects to be discharged to:: Private residence Living Arrangements: Children (lives with daughter) Available Help at Discharge: Family;Friend(s);Available  PRN/intermittently (pt reports his ex brother in law could help him if needed) Type of Home: Mobile home Home Access: Stairs to enter Entrance Stairs-Rails: Right;Left;Can reach both Entrance Stairs-Number of Steps: 3   Home Layout: One level Home Equipment: Rollator (4 wheels);Shower seat;Cane - single point      Prior Function Prior Level of Function : Independent/Modified Independent;Driving;History of Falls (last six months) (Pt reports multiple falls, does not provide specific number, within the past 6 months. He typically feels like he is about to pass out, rushes to get to a chair, and then falls.)             Mobility Comments: indep without AD ADLs Comments: indep     Extremity/Trunk Assessment   Upper Extremity Assessment Upper Extremity Assessment: Defer to OT evaluation    Lower Extremity Assessment Lower Extremity Assessment: Overall WFL for tasks assessed    Cervical / Trunk Assessment Cervical / Trunk Assessment: Kyphotic  Communication   Communication Communication: No apparent difficulties    Cognition Arousal: Alert Behavior During Therapy: WFL for tasks assessed/performed   PT - Cognitive impairments: Safety/Judgement                         Following commands: Intact       Cueing Cueing Techniques: Verbal cues, Gestural cues     General Comments General comments (skin integrity, edema, etc.): Supine BP forearm: 139/108, HR: 84 bpm, SpO2: 90% on 4L at rest    Exercises     Assessment/Plan    PT Assessment Patient needs continued PT services  PT Problem List Decreased activity tolerance;Decreased balance;Decreased mobility;Decreased knowledge of use of DME;Cardiopulmonary status limiting activity       PT Treatment Interventions DME instruction;Gait training;Stair training;Functional mobility training;Therapeutic activities;Therapeutic exercise;Balance training;Patient/family education;Manual techniques;Modalities    PT  Goals (Current goals can be found in the Care Plan section)  Acute Rehab PT Goals Patient Stated Goal: to get more fluid off and be able to breathe better PT Goal Formulation: With patient Time For Goal Achievement: 01/02/25 Potential to Achieve Goals: Good    Frequency Min 2X/week     Co-evaluation               AM-PAC PT 6 Clicks Mobility  Outcome Measure Help needed turning from your back to your side while in a flat bed without using bedrails?: None Help needed moving from lying on your back to sitting on the side of a flat bed without using bedrails?: None Help needed moving to and from a bed to a chair (including a wheelchair)?: A Little Help needed standing up from a chair using your arms (e.g., wheelchair or bedside chair)?: A Little Help needed to walk in hospital room?: A Little Help needed climbing 3-5 steps with a railing? : Total 6 Click Score: 18    End of Session Equipment Utilized During Treatment: Gait belt;Oxygen Activity Tolerance: Patient limited by fatigue Patient left: in bed;with call bell/phone within reach Nurse Communication: Mobility status;Other (comment) (vitals) PT Visit Diagnosis: Other abnormalities of gait and mobility (R26.89);Muscle weakness (generalized) (M62.81);History of falling (Z91.81);Repeated falls (R29.6);Difficulty in walking, not elsewhere classified (R26.2)    Time: 8776-8747 PT Time Calculation (min) (ACUTE ONLY): 29 min  Charges:   PT Evaluation $PT Eval Moderate Complexity: 1 Mod   PT General Charges $$ ACUTE PT VISIT: 1 Visit         Leontine Hilt DPT Acute Rehab Services [phone] Prefer contact via chat   Leontine NOVAK Diego Delancey 12/19/2024, 1:22 PM

## 2024-12-19 NOTE — Progress Notes (Signed)
 Consulted to check established PICC line. Per radiologist reading of cxr PICC line is malpositioned. Discussed findings with primary RN; removal vs waiting for PICC team to exchange PICC tomorrow. Primary RN discussed options with MD who advised that PICC line should be removed immediately. PICC line was successfully removed. Manual pressure held for 2 min at site. Site covered with vaseline gauze, gauze and tegaderm. No bleeding, redness or swelling at site. Instructed pt to lay flat for 30 min and leave dressing in place for 24 hrs. Pt states understanding and tolerated procedure well. Unit RN advised as well. Pt has no complaints and denies any changes in condition after PICC line removal.

## 2024-12-19 NOTE — Progress Notes (Signed)
 Orthopedic Tech Progress Note Patient Details:  Ryan [name] 06-Aug-1966 [id number]  Ortho Devices Type of Ortho Device: Radio broadcast assistant Ortho Device/Splint Location: BLE Ortho Device/Splint Interventions: Ordered, Application, Adjustment   Post Interventions Patient Tolerated: Well Instructions Provided: Adjustment of device, Care of device  Ryan [name] 12/19/2024, 6:44 PM

## 2024-12-19 NOTE — TOC Progression Note (Signed)
 Transition of Care [facility]) - Progression Note    Patient Details  Name: Ryan [name] MRN: [id number] Date of Birth: 1966-04-01  Transition [facility] CM/SW Contact  Waddell Barnie Rama, RN Phone Number: 12/19/2024, 4:51 PM  Clinical Narrative:    NCM offered choice for rolling walker and HHPT, patient states he has no preference, [employer] to supply the rolling walker.  Sent referral out to portal for HHPT, check to see who accepted referral for HHPT.    Expected Discharge Plan: Home/Self Care Barriers to Discharge: Continued Medical Work up               Expected Discharge Plan and Services       Living arrangements for the past 2 months: Single Family Home                                       Social Drivers of Health (SDOH) Interventions SDOH Screenings   Food Insecurity: No Food Insecurity (12/18/2024)  Housing: Low Risk (12/18/2024)  Transportation Needs: No Transportation Needs (12/18/2024)  Utilities: Not At Risk (12/18/2024)  Alcohol  Screen: Low Risk (03/30/2022)  Depression (PHQ2-9): Low Risk (12/11/2024)  Financial Resource Strain: Low Risk (03/30/2022)  Social Connections: Unknown (12/18/2024)  Tobacco Use: Medium Risk (12/18/2024)    Readmission Risk Interventions     No data to display

## 2024-12-19 NOTE — Progress Notes (Signed)
 Peripherally Inserted Central Catheter Placement  The IV Nurse has discussed with the patient and/or persons authorized to consent for the patient, the purpose of this procedure and the potential benefits and risks involved with this procedure.  The benefits include less needle sticks, lab draws from the catheter, and the patient may be discharged home with the catheter. Risks include, but not limited to, infection, bleeding, blood clot (thrombus formation), and puncture of an artery; nerve damage and irregular heartbeat and possibility to perform a PICC exchange if needed/ordered by physician.  Alternatives to this procedure were also discussed.  Bard Power PICC patient education guide, fact sheet on infection prevention and patient information card has been provided to patient /or left at bedside.    PICC Placement Documentation  PICC Double Lumen 12/19/24 Left Brachial 45 cm 0 cm (Active)  Indication for Insertion or Continuance of Line Vasoactive infusions 12/19/24 0920  Exposed Catheter (cm) 0 cm 12/19/24 0920  Site Assessment Clean, Dry, Intact 12/19/24 0920  Lumen #1 Status Flushed;Saline locked;Blood return noted 12/19/24 0920  Lumen #2 Status Flushed;Saline locked;Blood return noted 12/19/24 0920  Dressing Type Transparent;Securing device 12/19/24 0920  Dressing Status Antimicrobial disc/dressing in place;Clean, Dry, Intact 12/19/24 0920  Line Care Connections checked and tightened 12/19/24 0920  Line Adjustment (NICU/IV Team Only) No 12/19/24 0920  Dressing Intervention New dressing;Adhesive placed at insertion site (IV team only) 12/19/24 0920  Dressing Change Due 12/26/24 12/19/24 0920       Jolee Na 12/19/2024, 9:21 AM

## 2024-12-19 NOTE — TOC Initial Note (Signed)
 Transition of Care [facility]) - Initial/Assessment Note    Patient Details  Name: Ryan [name] MRN: [id number] Date of Birth: [birth year]  Transition of Care [facility]) CM/SW Contact:    Arlana JINNY Nicholaus ISRAEL Phone Number: [phone] 12/19/2024, 12:48 PM  Clinical Narrative:   HF CSW met with patient at bedside. Patient stated that he lives with his daughter and SIL. Patient stated that he has no history of HH services. Patient stated that he uses O2 at home and requested a walker. Patient stated that he has scale at home. Patient stated that he has a PCP. CSW explained that a hospital follow up appointment is typically scheduled closer towards dc. Patient is agreeable.   HF CSW/CM will continue to follow and monitor for dc readiness.                 Expected Discharge Plan: Home/Self Care Barriers to Discharge: Continued Medical Work up   Patient Goals and CMS Choice Patient states their goals for this hospitalization and ongoing recovery are:: return home CMS Medicare.gov Compare Post Acute Care list provided to:: Patient Choice offered to / list presented to : Patient [place] ownership interest in [facility].provided to:: Patient    Expected Discharge Plan and Services       Living arrangements for the past 2 months: Single Family Home                                      Prior Living Arrangements/Services Living arrangements for the past 2 months: Single Family Home Lives with:: Adult Children, Relatives Patient language and need for interpreter reviewed:: Yes Do you feel safe going back to the place where you live?: Yes      Need for Family Participation in Patient Care: Yes (Comment) Care giver support system in place?: Yes (comment)   Criminal Activity/Legal Involvement Pertinent to Current Situation/Hospitalization: No - Comment as needed  Activities of Daily Living   ADL Screening (condition at time of admission) Independently performs ADLs?:  No Does the patient have a NEW difficulty with bathing/dressing/toileting/self-feeding that is expected to last >3 days?: No Does the patient have a NEW difficulty with getting in/out of bed, walking, or climbing stairs that is expected to last >3 days?: Yes (Initiates electronic notice to provider for possible PT consult) Does the patient have a NEW difficulty with communication that is expected to last >3 days?: No Is the patient deaf or have difficulty hearing?: No Does the patient have difficulty seeing, even when wearing glasses/contacts?: No Does the patient have difficulty concentrating, remembering, or making decisions?: No  Permission Sought/Granted Permission sought to share information with : Family Supports, PCP Permission granted to share information with : Yes, Verbal Permission Granted              Emotional Assessment Appearance:: Appears stated age Attitude/Demeanor/Rapport: Engaged Affect (typically observed): Appropriate Orientation: : Oriented to Self, Oriented to Place, Oriented to  Time, Oriented to Situation Alcohol  / Substance Use: Not Applicable Psych Involvement: No (comment)  Admission diagnosis:  Acute on chronic combined systolic and diastolic CHF (congestive heart failure) (HCC) [I50.43] Acute on chronic congestive heart failure, unspecified heart failure type [facility]) [I50.9] Patient Active Problem List   Diagnosis Date Noted   Acute on chronic combined systolic and diastolic CHF (congestive heart failure) (HCC) 12/18/2024   Non-restorative sleep 10/23/2024   Snoring 10/23/2024   Hypokalemia  07/20/2024   Iron  deficiency anemia 07/20/2024   Chronic respiratory failure with hypoxia (HCC) 07/20/2024   Hyperlipidemia 07/20/2024   Chronic combined systolic and diastolic CHF (congestive heart failure) (HCC) 03/01/2024   Chronic kidney disease, stage 3a (HCC) 03/01/2024   Obesity, class 1 03/01/2024   Left ventricular apical thrombus 03/26/2023   Pure  hypercholesterolemia 03/26/2023   Paroxysmal atrial fibrillation (HCC) 03/31/2022   S/P CABG x 3 05/05/2020   Atherosclerosis of native artery of both lower extremities with intermittent claudication 04/01/2018   Centrilobular emphysema (HCC) 07/31/2017   Hyponatremia 07/31/2017   Non-compliance 07/31/2017   Transaminitis 07/31/2017   Essential hypertension 10/23/2016   Subclavian steal syndrome 08/11/2013   PVD (peripheral vascular disease) 03/31/2013   Numbness and tingling of right arm 03/31/2013   Pain in joint, upper arm 03/31/2013   Carotid artery disease 08/26/2012   Chronic total occlusion of artery of the extremities 02/26/2012   PAD (peripheral artery disease) 01/24/2012   Ischemic cardiomyopathy 01/24/2012   Cardiomyopathy, secondary (HCC) 12/26/2011   Type 2 diabetes mellitus with complication, with long-term current use of insulin  (HCC) 12/14/2011   Tobacco abuse    CAD (coronary artery disease)    PCP:  [name], MD Pharmacy:   [facility] [place], [place] - [address] [place] [address] Phone: [phone] Fax: [phone]  [place] [facility] [address] [place] [address] Phone: [phone] Fax: [phone]  [place] [facility] [address] [place] [address] Phone: [phone] Fax: [phone]     Social Drivers of Health (SDOH) Social History: SDOH Screenings   Food Insecurity: No Food Insecurity (12/18/2024)  Housing: Low Risk (12/18/2024)  Transportation Needs: No Transportation Needs (12/18/2024)  Utilities: Not At Risk (12/18/2024)  Alcohol  Screen: Low Risk (03/30/2022)  Depression (PHQ2-9): Low Risk (12/11/2024)  Financial Resource Strain: Low Risk (03/30/2022)  Social Connections: Unknown (12/18/2024)  Tobacco Use: Medium Risk (12/18/2024)   SDOH Interventions:     Readmission Risk Interventions     No data to display

## 2024-12-19 NOTE — Plan of Care (Signed)
" °  Problem: Coping: Goal: Ability to adjust to condition or change in health will improve Outcome: Progressing   Problem: Fluid Volume: Goal: Ability to maintain a balanced intake and output will improve Outcome: Progressing   Problem: Health Behavior/Discharge Planning: Goal: Ability to manage health-related needs will improve Outcome: Progressing   Problem: Metabolic: Goal: Ability to maintain appropriate glucose levels will improve Outcome: Progressing   Problem: Nutritional: Goal: Maintenance of adequate nutrition will improve Outcome: Progressing   Problem: Health Behavior/Discharge Planning: Goal: Ability to manage health-related needs will improve Outcome: Progressing   Problem: Clinical Measurements: Goal: Ability to maintain clinical measurements within normal limits will improve Outcome: Progressing Goal: Respiratory complications will improve Outcome: Progressing   "

## 2024-12-19 NOTE — Progress Notes (Addendum)
 "    Advanced Heart Failure Rounding Note  Cardiologist: [name], MD  [facility] Cardiologist: Dr. Zenaida Chief Complaint: a/c systolic heart failure  Patient Profile   Ryan [name] is a 59 y.o. male with a PMH of CAD s/p multiple PCIs and CABG, chronic systolic heart failure d/t ischemic CM, AFL s/p AFL ablation, PAD, HLD, LV apical thrombus, carotid artery disease and tobacco use, admitted w/ a/c CHF in the setting of dietary indiscretion. Failed attempts at outpatient diuresis.   Significant events:   1/15: placed lasix  gtt @20 /hr + Diamox   Subjective:    Reports good UOP, 2L charted. C/w marked edema. No CP.   Awaiting PICC placement   SCr 1.5 c/w baseline, K 2.9, Mg 2.7, CO2 33    Objective:   Weight Range: 102.5 kg Body mass index is 33.37 kg/m.   Vital Signs:   Temp:  [97.3 F (36.3 C)-98 F (36.7 C)] 97.8 F (36.6 C) (01/16 0300) Pulse Rate:  [85-105] 88 (01/16 0300) Resp:  [19-28] 19 (01/16 0300) BP: (82-145)/(59-115) 111/84 (01/16 0300) SpO2:  [90 %-99 %] 96 % (01/16 0300) FiO2 (%):  [36 %] 36 % (01/15 2040) Weight:  [102.1 kg-102.5 kg] 102.5 kg (01/16 0300) Last BM Date : 12/17/24  Weight change: Filed Weights   12/18/24 1223 12/19/24 0300  Weight: 102.1 kg 102.5 kg    Intake/Output:   Intake/Output Summary (Last 24 hours) at 12/19/2024 0711 Last data filed at 12/19/2024 0342 Gross per 24 hour  Intake 474.26 ml  Output 2000 ml  Net -1525.74 ml     Physical Exam   General:  fatigued appearing.   Cor: Regular rate & rhythm. No murmurs. JVD elevated to jaw  Lungs: crackles at bases bilaterally R>L  Extremities: 2+ b/l LEE    Telemetry   NSR 80s w/ occasional PVCs, 4 beat run of NSVT, personally reviewed   Labs   CBC Recent Labs    12/18/24 1237 12/19/24 0259  WBC 8.0 7.3  HGB 9.2* 8.3*  HCT 31.2* 28.6*  MCV 76.5* 77.5*  PLT 241 215   Basic Metabolic Panel Recent Labs    98/84/73 1515 12/19/24 0259 12/19/24 0301  NA  --   131* 132*  K  --  2.9* 2.9*  CL  --  87* 87*  CO2  --  33* 33*  GLUCOSE  --  225* 223*  BUN  --  48* 48*  CREATININE  --  1.51* 1.50*  CALCIUM   --  8.4* 8.5*  MG 2.5* 2.7*  --   PHOS  --   --  4.3   Liver Function Tests Recent Labs    12/19/24 0259 12/19/24 0301  AST 26  --   ALT 15  --   ALKPHOS 379*  --   BILITOT 1.2  --   PROT 7.2  --   ALBUMIN  3.4* 3.4*   No results for input(s): LIPASE, AMYLASE in the last 72 hours. Cardiac Enzymes No results for input(s): CKTOTAL, CKMB, CKMBINDEX, TROPONINI in the last 72 hours.  BNP: BNP (last 3 results) Recent Labs    07/29/24 0856 11/13/24 1242 11/17/24 1436  BNP 1,679.4* 1,211.2* 1,251.0*    ProBNP (last 3 results) Recent Labs    12/18/24 1237  PROBNP 3,670.0*     D-Dimer No results for input(s): DDIMER in the last 72 hours. Hemoglobin A1C No results for input(s): HGBA1C in the last 72 hours. Fasting Lipid Panel No results for input(s): CHOL, HDL, LDLCALC,  TRIG, CHOLHDL, LDLDIRECT in the last 72 hours. Medications:   Scheduled Medications:  apixaban   5 mg Oral BID   arformoterol   15 mcg Nebulization BID   And   umeclidinium bromide   1 puff Inhalation Daily   clopidogrel   75 mg Oral Daily   ezetimibe   10 mg Oral Daily   gabapentin   300 mg Oral BID   icosapent  Ethyl  2 g Oral BID   insulin  aspart  0-15 Units Subcutaneous TID WC   insulin  glargine-yfgn  25 Units Subcutaneous BID   pantoprazole   40 mg Oral Daily   potassium chloride   40 mEq Oral Q4H   ramelteon   8 mg Oral QHS   sodium chloride  flush  3 mL Intravenous Q12H    Infusions:  furosemide  Stopped (12/18/24 1727)   furosemide  (LASIX ) 200 mg in dextrose  5 % 100 mL (2 mg/mL) infusion 20 mg/hr (12/19/24 0342)    PRN Medications: acetaminophen  **OR** acetaminophen , albuterol , polyethylene glycol  Assessment/Plan   Acute on chronic Systolic Heart failure: Ischemic related with recent worsening in the setting of atrial  flutter.  - Echo 8/25: EF 45%, Akinesis of distal lateral and apical walls. LV with GHK, GIIDD, RV low normal, LA mildly dilated, trivial MR - S/p flutter ablation, though having rare episodes of fib.   - NYHA IV. Has failed outpatient diuretic titration.  - Diuresing on Lasix  gtt, remains markedly volume overloaded, continue Lasix  gtt at 20/hr - Continue diamox  500 mg x 2 today - Place PICC, follow CVP and co-ox - start spiro 12.5 mg daily  - BP too soft for ARB  - Off digoxin  with resumption of NSR and improved EF - No SGLT2i with A1C of 12.3 - Update echo - Patient not a candidate for advanced therapies given severe PAD   AFL/AFib: Recurrent, s/p atrial flutter ablation with Dr. Kennyth 6/25. Loop recorder implanted to evaluate for atrial fibrillation. - s/p AF ablation 12/25. - Now off amiodarone  - in NSR  - Continue Eliquis  5 mg bid. No bleeding issues. - He has Pulmonary appointment for sleep apnea given that he does not qualify for home sleep study by insurance. Also likely has element of COPD.     CAD: No chest pain - Continue Plavix  & apixaban  - Continue atorvastatin , repatha , LDL at goal.   PAD: Extensive history, carotid as well as femoral disease.  - Not a candidate for advanced therapies - Continue eliquis , plavix , aggressive risk factor modification - Smoking cessation encouraged   IDA:  - Noted Hgb down trending during last admission - TSat 7% 8/25, s/p IV iron  - Repeat iron  panels low, plan for further IV iron .    DM: - Uncontrolled.  - Last A1c 12.5 8/25 - He is on insulin  - Off SGLT2i for now  Hypokalemia: - K 2.9 w/ diuresis  - aggressive K supp  - add spiro 12.5 mg   Length of Stay: 1  Brittainy Simmons, PA-C  12/19/2024, 7:11 AM  Advanced Heart Failure Team Pager [phone] (M-F; 7a - 5p)   Please visit [email or web address]: For overnight coverage please call cardiology fellow first. If fellow not available call Shock/ECMO MD on call.  For ECMO /  Mechanical Support (Impella, IABP, LVAD) issues call Shock / ECMO MD on call.   Patient seen with PA, I formulated the plan and agree with the above note.   Echo assessed at bedside, EF in 35% range with restrictive diastolic function, moderate RV dysfunction/moderate RV dilation, dilated IVC.  PICC  placed, co-ox 41%. CVP not set up yet.  Creatinine stable at 1.5.   Transferrin saturation 6%.   General: NAD Neck: JVP 14-16 cm, no thyromegaly or thyroid nodule.  Lungs: Clear to auscultation bilaterally with normal respiratory effort. CV: Nondisplaced PMI.  Heart regular S1/S2, no S3/S4, no murmur.  2+ edema to knees.   Abdomen: Soft, nontender, no hepatosplenomegaly, no distention.  Skin: Intact without lesions or rashes.  Neurologic: Alert and oriented x 3.  Psych: Normal affect. Extremities: No clubbing or cyanosis.  HEENT: Normal.   Biventricular failure with volume overload and low output.   He remains volume overloaded on exam though diuresing well on current regimen. Creatinine stable at 1.5.  - Continue Lasix  gtt 20 mg/hr and will give acetazolamide  500 mg bid.   - Start milrinone  0.25 mcg/kg/min with co-ox 41%.  - Follow CVP.  - Add spironolactone  12.5 daily.   He will get IV Fe today.   Patient is still smoking at home, has COPD and wears home oxygen. Burned his nose recently due to smoking at same time as oxygen was running.   Ryan [name] 12/19/2024 12:31 PM  "

## 2024-12-19 NOTE — Progress Notes (Signed)
 Pt unable to have a CVP line due to PICC line malposition on the xray. PICC line was removed per MD Verbal order through secure chat. Pt is stable with no complications and no bleeding from the site.

## 2024-12-19 NOTE — Progress Notes (Signed)
 " PROGRESS NOTE    Ryan [name]  FMW:[id number] DOB: [birth year] DOA: 12/18/2024 PCP: [name], MD  58/M with history of chronic combined CHF, emphysema, chronic respiratory failure, paroxysmal A-fib, hypertension dyslipidemia, CAD/CABG, PAD, carotid artery disease presented to the ED with worsening shortness of breath and edema, sent to the ER from heart failure clinic, recently treated with Furoscix  and metolazone  with minimal response.  In the ED sodium 132, potassium 3.0, creatinine 1.5, hemoglobin 9.2, proBNP 3670, troponin 49, chest x-ray with pulmonary edema and right-sided pleural effusion   Subjective: -Starting to feel better  Assessment and Plan:  Acute on chronic combined systolic and diastolic CHF - Last echo 8/25 noted EF 45%, G2 DD, global hypokinesis, low normal RV systolic function. -Sent from heart failure clinic with CHF exacerbation -CHF team following, continue Lasix  gtt., significantly volume overloaded, PICC line pending for today -Started on Aldactone , continue Diamox  -Follow-up repeat echo  Paroxysmal A-fib/flutter, recurrent -Prior ablation 6/25 -Currently in sinus rhythm -Continue Eliquis    COPD/chronic respiratory failure with hypoxia -On 2 to 2-1/2 L at home.   - Continue Brovana  and Incruse, PRN albuterol    Hypertension - Continue diuresis as above   Hyperlipidemia - Continue home Zetia  and Vascepa  - Takes Repatha  outpatient   CAD/CABG PAD: Status post aortobifem bypass and left subclavian bypass Carotid artery disease - Continue home Eliquis , Plavix  - Continue Vascepa , Zetia  - Takes Repatha  outpatient   DM2 > 50 units long-acting insulin  twice daily and SSI outpatient - CBGs poorly controlled, increase insulin  dose   Obesity - Noted   Iron  deficiency anemia -Iron  sat down to 7%, give IV iron   Tobacco abuse Counseled   DVT prophylaxis:      Eliquis  Code Status:              Full Family Communication:       None on admission   Disposition Plan: Home pending improvement in volume status  Consultants: Cards   Procedures:   Antimicrobials:    Objective: Vitals:   12/18/24 2103 12/18/24 2300 12/19/24 0300 12/19/24 0820  BP:  102/65 111/84 116/87  Pulse:  87 88 89  Resp: 20 20 19 20   Temp: 97.7 F (36.5 C) (!) 97.3 F (36.3 C) 97.8 F (36.6 C) (!) 97.3 F (36.3 C)  TempSrc: Oral Oral Oral Oral  SpO2:  96% 96% 96%  Weight:   102.5 kg   Height:   5' 9 (1.753 m)     Intake/Output Summary (Last 24 hours) at 12/19/2024 0926 Last data filed at 12/19/2024 0700 Gross per 24 hour  Intake 594.26 ml  Output 2500 ml  Net -1905.74 ml   Filed Weights   12/18/24 1223 12/19/24 0300  Weight: 102.1 kg 102.5 kg    Examination:  General exam: Chronically ill-appearing, AAO x 3 HEENT: Positive JVD CVS: S1-S2, regular rhythm Lungs: Bilateral crackles Abdomen: Distended, nontender, abdominal wall edema Extremities: 2+ edema    Data Reviewed:   CBC: Recent Labs  Lab 12/18/24 1237 12/19/24 0259  WBC 8.0 7.3  HGB 9.2* 8.3*  HCT 31.2* 28.6*  MCV 76.5* 77.5*  PLT 241 215   Basic Metabolic Panel: Recent Labs  Lab 12/18/24 1237 12/18/24 1515 12/19/24 0259 12/19/24 0301  NA 132*  --  131* 132*  K 3.0*  --  2.9* 2.9*  CL 84*  --  87* 87*  CO2 35*  --  33* 33*  GLUCOSE 174*  --  225* 223*  BUN 51*  --  48* 48*  CREATININE 1.51*  --  1.51* 1.50*  CALCIUM  8.6*  --  8.4* 8.5*  MG  --  2.5* 2.7*  --   PHOS  --   --   --  4.3   GFR: Estimated Creatinine Clearance: 63.3 mL/min (A) (by C-G formula based on SCr of 1.5 mg/dL (H)). Liver Function Tests: Recent Labs  Lab 12/19/24 0259 12/19/24 0301  AST 26  --   ALT 15  --   ALKPHOS 379*  --   BILITOT 1.2  --   PROT 7.2  --   ALBUMIN  3.4* 3.4*   No results for input(s): LIPASE, AMYLASE in the last 168 hours. No results for input(s): AMMONIA in the last 168 hours. Coagulation Profile: No results for input(s): INR, PROTIME in the  last 168 hours. Cardiac Enzymes: No results for input(s): CKTOTAL, CKMB, CKMBINDEX, TROPONINI in the last 168 hours. BNP (last 3 results) Recent Labs    12/18/24 1237  PROBNP 3,670.0*   HbA1C: No results for input(s): HGBA1C in the last 72 hours. CBG: Recent Labs  Lab 12/18/24 2122 12/19/24 0602  GLUCAP 244* 205*   Lipid Profile: No results for input(s): CHOL, HDL, LDLCALC, TRIG, CHOLHDL, LDLDIRECT in the last 72 hours. Thyroid Function Tests: No results for input(s): TSH, T4TOTAL, FREET4, T3FREE, THYROIDAB in the last 72 hours. Anemia Panel: Recent Labs    12/19/24 0259  FERRITIN 179  TIBC 406  IRON  24*   Urine analysis:    Component Value Date/Time   COLORURINE YELLOW 03/01/2024 0625   APPEARANCEUR CLEAR 03/01/2024 0625   LABSPEC 1.009 03/01/2024 0625   PHURINE 6.0 03/01/2024 0625   GLUCOSEU >=500 (A) 03/01/2024 0625   HGBUR NEGATIVE 03/01/2024 0625   BILIRUBINUR NEGATIVE 03/01/2024 0625   KETONESUR NEGATIVE 03/01/2024 0625   PROTEINUR 100 (A) 03/01/2024 0625   UROBILINOGEN 0.2 04/04/2013 0520   NITRITE NEGATIVE 03/01/2024 0625   LEUKOCYTESUR NEGATIVE 03/01/2024 0625   Sepsis Labs: @LABRCNTIP (procalcitonin:4,lacticidven:4)  ) Recent Results (from the past 240 hours)  Resp panel by RT-PCR (RSV, Flu A&B, Covid) Anterior Nasal Swab     Status: None   Collection Time: 12/18/24 12:35 PM   Specimen: Anterior Nasal Swab  Result Value Ref Range Status   SARS Coronavirus 2 by RT PCR NEGATIVE NEGATIVE Final   Influenza A by PCR NEGATIVE NEGATIVE Final   Influenza B by PCR NEGATIVE NEGATIVE Final    Comment: (NOTE) The Xpert Xpress SARS-CoV-2/FLU/RSV plus assay is intended as an aid in the diagnosis of influenza from Nasopharyngeal swab specimens and should not be used as a sole basis for treatment. Nasal washings and aspirates are unacceptable for Xpert Xpress SARS-CoV-2/FLU/RSV testing.  Fact Sheet for  Patients: bloggercourse.com  Fact Sheet for Healthcare Providers: seriousbroker.it  This test is not yet approved or cleared by the [place]  FDA and has been authorized for detection and/or diagnosis of SARS-CoV-2 by FDA under an Emergency Use Authorization (EUA). This EUA will remain in effect (meaning this test can be used) for the duration of the COVID-19 declaration under Section 564(b)(1) of the Act, 21 U.S.C. section 360bbb-3(b)(1), unless the authorization is terminated or revoked.     Resp Syncytial Virus by PCR NEGATIVE NEGATIVE Final    Comment: (NOTE) Fact Sheet for Patients: bloggercourse.com  Fact Sheet for Healthcare Providers: seriousbroker.it  This test is not yet approved or cleared by the [place]  FDA and has been authorized for detection and/or diagnosis of SARS-CoV-2 by FDA under an  Emergency Use Authorization (EUA). This EUA will remain in effect (meaning this test can be used) for the duration of the COVID-19 declaration under Section 564(b)(1) of the Act, 21 U.S.C. section 360bbb-3(b)(1), unless the authorization is terminated or revoked.  Performed at [facility], [address] [place], [place] [address]      Radiology Studies: DG Chest 2 View Result Date: 12/18/2024 CLINICAL DATA:  Shortness of breath EXAM: DG CHEST 2V COMPARISON:  11/17/2024 FINDINGS: Sternotomy and left-sided recording device. Cardiomegaly with small left and moderate right pleural effusions. Airspace disease at right base. Diffuse interstitial and ground-glass opacity in the lungs, probably due to pulmonary edema IMPRESSION: Cardiomegaly with small left and moderate right pleural effusions. Diffuse interstitial and ground-glass opacity in the lungs, probably due to pulmonary edema. Electronically Signed   By: Luke Bun M.D.   On: 12/18/2024 21:02   DG CHEST PORT 1  VIEW Result Date: 12/18/2024 CLINICAL DATA:  Line EXAM: PORTABLE CHEST 1 VIEW COMPARISON:  12/18/2024, 11/17/2024, 03/13/2023 FINDINGS: Post sternotomy changes with chronic sternal wire fractures. Electronic recording device over the chest wall. Cardiomegaly with moderate right-sided slightly loculated effusion and small left effusion. Diffuse increased interstitial and ground-glass opacity, likely due to mild edema. Consolidation at the right lung base. Right upper extremity central venous catheter courses cranially over the right neck, the tip is non included on the image. IMPRESSION: 1. Right upper extremity central venous catheter courses cranially over the right neck, the tip is non included on the image. Recommend repositioning. 2. Cardiomegaly with moderate right and small left effusions. Diffuse increased interstitial and ground-glass opacity, likely due to mild edema. Consolidation at the right lung base. These results will be called to the ordering clinician or representative by the Radiologist Assistant, and communication documented in the PACS or [employer]. Electronically Signed   By: Luke Bun M.D.   On: 12/18/2024 21:01   US  EKG SITE RITE Result Date: 12/18/2024 If Site Rite image not attached, placement could not be confirmed due to current cardiac rhythm.  DG Chest 2 View Result Date: 12/18/2024 EXAM: 2 VIEW(S) XRAY OF THE CHEST 12/18/2024 01:31:00 PM COMPARISON: 11/17/2024 CLINICAL HISTORY: sob FINDINGS: LINES, TUBES AND DEVICES: Left chest loop recorder noted. LUNGS AND PLEURA: Mild pulmonary edema. Moderate right pleural effusion with adjacent right basilar atelectasis. Trace left pleural effusion. No pneumothorax. HEART AND MEDIASTINUM: Stable cardiomediastinal silhouette with post-sternotomy and CABG changes. Similar cardiomegaly. BONES AND SOFT TISSUES: Similar discontinuity of sternotomy wires. IMPRESSION: 1. Mild pulmonary edema with moderate right pleural effusion and  adjacent right basilar atelectasis. 2. Trace left pleural effusion. Electronically signed by: Waddell Calk MD 12/18/2024 01:51 PM EST RP Workstation: [id number]     Scheduled Meds:  acetaZOLAMIDE   500 mg Intravenous Q6H   apixaban   5 mg Oral BID   arformoterol   15 mcg Nebulization BID   And   umeclidinium bromide   1 puff Inhalation Daily   Chlorhexidine  Gluconate Cloth  6 each Topical Daily   clopidogrel   75 mg Oral Daily   ezetimibe   10 mg Oral Daily   gabapentin   300 mg Oral BID   icosapent  Ethyl  2 g Oral BID   insulin  aspart  0-15 Units Subcutaneous TID WC   insulin  aspart  3 Units Subcutaneous TID WC   insulin  glargine-yfgn  25 Units Subcutaneous BID   pantoprazole   40 mg Oral Daily   potassium chloride   40 mEq Oral Once   ramelteon   8 mg Oral  QHS   sodium chloride  flush  3 mL Intravenous Q12H   spironolactone   12.5 mg Oral Daily   Continuous Infusions:  furosemide  Stopped (12/18/24 1727)   furosemide  (LASIX ) 200 mg in dextrose  5 % 100 mL (2 mg/mL) infusion 20 mg/hr (12/19/24 0342)   iron  sucrose (VENOFER ) 500 mg in sodium chloride  0.9 % 250 mL IVPB     potassium chloride  10 mEq (12/19/24 0755)     LOS: 1 day    Time spent:    Sigurd Pac, MD [facility]   12/19/2024, 9:26 AM    "

## 2024-12-19 NOTE — Inpatient Diabetes Management (Addendum)
 Inpatient Diabetes Program Recommendations  AACE/ADA: New Consensus Statement on Inpatient Glycemic Control (2015)  Target Ranges:  Prepandial:   less than 140 mg/dL      Peak postprandial:   less than 180 mg/dL (1-2 hours)      Critically ill patients:  140 - 180 mg/dL   Lab Results  Component Value Date   GLUCAP 154 (H) 12/19/2024   HGBA1C 12.3 (H) 07/18/2024    Latest Reference Range & Units 12/18/24 21:22 12/19/24 06:02 12/19/24 11:27  Glucose-Capillary 70 - 99 mg/dL 755 (H) 794 (H) 845 (H)   Review of Glycemic Control  Diabetes history: DM2  Outpatient Diabetes medications:  Lantus  50 units BID  Humalog  0-15 units TID   Current orders for Inpatient glycemic control:  Lantus  30 units BID Novolog  0-15 units TID  Novolog  3 units TID   Inpatient Diabetes Program Recommendations:   Briefly spoke with patient at bedside about diabetes and home regimen for diabetes control. Patient reports being followed by Dr Shelda for diabetes management and currently taking Lantus  50 units BID and Humalog  0-15 units TID outpatient for diabetes control.  Patient reports taking DM medications as prescribed. Inquired about prior A1C and patient reports not being able to recall last A1C value. Discussed A1C results 12.3% and explained that current A1C indicates an average glucose of 306 mg/dl over the past 2-3 months.  Discussed glucose and A1C goals. Discussed importance of checking CBGs and maintaining good CBG control to prevent long-term and short-term complications. MD came into room to speak with patient. Patient was very appreciative of information provided.   Diabetes Coordinator will continue to follow.   Thanks,  Lavanda Search, RN, MSN, [facility]  Inpatient Diabetes Coordinator  Pager [phone] (8a-5p)

## 2024-12-20 ENCOUNTER — Encounter (HOSPITAL_COMMUNITY): Payer: Self-pay | Admitting: Anesthesiology

## 2024-12-20 ENCOUNTER — Inpatient Hospital Stay (HOSPITAL_COMMUNITY)

## 2024-12-20 DIAGNOSIS — I469 Cardiac arrest, cause unspecified: Secondary | ICD-10-CM | POA: Diagnosis not present

## 2024-12-20 DIAGNOSIS — I480 Paroxysmal atrial fibrillation: Secondary | ICD-10-CM | POA: Diagnosis not present

## 2024-12-20 DIAGNOSIS — I5043 Acute on chronic combined systolic (congestive) and diastolic (congestive) heart failure: Secondary | ICD-10-CM | POA: Diagnosis not present

## 2024-12-20 DIAGNOSIS — W010XXA Fall on same level from slipping, tripping and stumbling without subsequent striking against object, initial encounter: Secondary | ICD-10-CM

## 2024-12-20 DIAGNOSIS — I5021 Acute systolic (congestive) heart failure: Secondary | ICD-10-CM | POA: Diagnosis not present

## 2024-12-20 DIAGNOSIS — E119 Type 2 diabetes mellitus without complications: Secondary | ICD-10-CM | POA: Diagnosis not present

## 2024-12-20 DIAGNOSIS — R570 Cardiogenic shock: Secondary | ICD-10-CM

## 2024-12-20 LAB — RENAL FUNCTION PANEL
Albumin: 3.5 g/dL (ref 3.5–5.0)
Anion gap: 11 (ref 5–15)
BUN: 44 mg/dL — ABNORMAL HIGH (ref 6–20)
CO2: 30 mmol/L (ref 22–32)
Calcium: 8.5 mg/dL — ABNORMAL LOW (ref 8.9–10.3)
Chloride: 86 mmol/L — ABNORMAL LOW (ref 98–111)
Creatinine, Ser: 1.61 mg/dL — ABNORMAL HIGH (ref 0.61–1.24)
GFR, Estimated: 49 mL/min — ABNORMAL LOW
Glucose, Bld: 126 mg/dL — ABNORMAL HIGH (ref 70–99)
Phosphorus: 4.2 mg/dL (ref 2.5–4.6)
Potassium: 3.7 mmol/L (ref 3.5–5.1)
Sodium: 127 mmol/L — ABNORMAL LOW (ref 135–145)

## 2024-12-20 LAB — BASIC METABOLIC PANEL WITH GFR
Anion gap: 11 (ref 5–15)
BUN: 43 mg/dL — ABNORMAL HIGH (ref 6–20)
CO2: 30 mmol/L (ref 22–32)
Calcium: 8.4 mg/dL — ABNORMAL LOW (ref 8.9–10.3)
Chloride: 86 mmol/L — ABNORMAL LOW (ref 98–111)
Creatinine, Ser: 1.63 mg/dL — ABNORMAL HIGH (ref 0.61–1.24)
GFR, Estimated: 49 mL/min — ABNORMAL LOW
Glucose, Bld: 127 mg/dL — ABNORMAL HIGH (ref 70–99)
Potassium: 3.7 mmol/L (ref 3.5–5.1)
Sodium: 127 mmol/L — ABNORMAL LOW (ref 135–145)

## 2024-12-20 LAB — COMPREHENSIVE METABOLIC PANEL WITH GFR
ALT: 10 U/L (ref 0–44)
ALT: 14 U/L (ref 0–44)
AST: 29 U/L (ref 15–41)
AST: 28 U/L (ref 15–41)
Albumin: 2.6 g/dL — ABNORMAL LOW (ref 3.5–5.0)
Albumin: 3.3 g/dL — ABNORMAL LOW (ref 3.5–5.0)
Alkaline Phosphatase: 220 U/L — ABNORMAL HIGH (ref 38–126)
Alkaline Phosphatase: 278 U/L — ABNORMAL HIGH (ref 38–126)
Anion gap: 15 (ref 5–15)
BUN: 33 mg/dL — ABNORMAL HIGH (ref 6–20)
BUN: 43 mg/dL — ABNORMAL HIGH (ref 6–20)
CO2: 15 mmol/L — ABNORMAL LOW (ref 22–32)
CO2: 26 mmol/L (ref 22–32)
Calcium: 6.9 mg/dL — ABNORMAL LOW (ref 8.9–10.3)
Calcium: 8.7 mg/dL — ABNORMAL LOW (ref 8.9–10.3)
Chloride: <65 mmol/L — CL (ref 98–111)
Chloride: 88 mmol/L — ABNORMAL LOW (ref 98–111)
Creatinine, Ser: 1.53 mg/dL — ABNORMAL HIGH (ref 0.61–1.24)
Creatinine, Ser: 1.82 mg/dL — ABNORMAL HIGH (ref 0.61–1.24)
GFR, Estimated: 52 mL/min — ABNORMAL LOW
GFR, Estimated: 43 mL/min — ABNORMAL LOW
Glucose, Bld: 1117 mg/dL (ref 70–99)
Glucose, Bld: 77 mg/dL (ref 70–99)
Potassium: 2.7 mmol/L — CL (ref 3.5–5.1)
Potassium: 3.4 mmol/L — ABNORMAL LOW (ref 3.5–5.1)
Sodium: 108 mmol/L — CL (ref 135–145)
Sodium: 129 mmol/L — ABNORMAL LOW (ref 135–145)
Total Bilirubin: 1 mg/dL (ref 0.0–1.2)
Total Bilirubin: 1.5 mg/dL — ABNORMAL HIGH (ref 0.0–1.2)
Total Protein: 5.5 g/dL — ABNORMAL LOW (ref 6.5–8.1)
Total Protein: 6.9 g/dL (ref 6.5–8.1)

## 2024-12-20 LAB — CBC
HCT: 28.3 % — ABNORMAL LOW (ref 39.0–52.0)
HCT: 26.9 % — ABNORMAL LOW (ref 39.0–52.0)
HCT: 27.8 % — ABNORMAL LOW (ref 39.0–52.0)
Hemoglobin: 8.3 g/dL — ABNORMAL LOW (ref 13.0–17.0)
Hemoglobin: 6.5 g/dL — CL (ref 13.0–17.0)
Hemoglobin: 8 g/dL — ABNORMAL LOW (ref 13.0–17.0)
MCH: 22.3 pg — ABNORMAL LOW (ref 26.0–34.0)
MCH: 22.4 pg — ABNORMAL LOW (ref 26.0–34.0)
MCH: 22.5 pg — ABNORMAL LOW (ref 26.0–34.0)
MCHC: 29.3 g/dL — ABNORMAL LOW (ref 30.0–36.0)
MCHC: 24.2 g/dL — ABNORMAL LOW (ref 30.0–36.0)
MCHC: 28.8 g/dL — ABNORMAL LOW (ref 30.0–36.0)
MCV: 75.9 fL — ABNORMAL LOW (ref 80.0–100.0)
MCV: 92.8 fL (ref 80.0–100.0)
MCV: 78.1 fL — ABNORMAL LOW (ref 80.0–100.0)
Platelets: 228 K/uL (ref 150–400)
Platelets: 224 K/uL (ref 150–400)
Platelets: 274 K/uL (ref 150–400)
RBC: 3.73 MIL/uL — ABNORMAL LOW (ref 4.22–5.81)
RBC: 2.9 MIL/uL — ABNORMAL LOW (ref 4.22–5.81)
RBC: 3.56 MIL/uL — ABNORMAL LOW (ref 4.22–5.81)
RDW: 24.3 % — ABNORMAL HIGH (ref 11.5–15.5)
RDW: 24.3 % — ABNORMAL HIGH (ref 11.5–15.5)
RDW: 24.4 % — ABNORMAL HIGH (ref 11.5–15.5)
WBC: 10.3 K/uL (ref 4.0–10.5)
WBC: 13.6 K/uL — ABNORMAL HIGH (ref 4.0–10.5)
WBC: 22.6 K/uL — ABNORMAL HIGH (ref 4.0–10.5)
nRBC: 0.2 % (ref 0.0–0.2)
nRBC: 1 % — ABNORMAL HIGH (ref 0.0–0.2)
nRBC: 0.4 % — ABNORMAL HIGH (ref 0.0–0.2)

## 2024-12-20 LAB — CG4 I-STAT (LACTIC ACID): Lactic Acid, Venous: 5.6 mmol/L (ref 0.5–1.9)

## 2024-12-20 LAB — ECHOCARDIOGRAM LIMITED
Height: 69 in
S' Lateral: 3.75 cm
Single Plane A4C EF: 38.2 %
Weight: 3703.73 [oz_av]

## 2024-12-20 LAB — POCT I-STAT 7, (LYTES, BLD GAS, ICA,H+H)
Acid-base deficit: 1 mmol/L (ref 0.0–2.0)
Bicarbonate: 24.5 mmol/L (ref 20.0–28.0)
Calcium, Ion: 1.13 mmol/L — ABNORMAL LOW (ref 1.15–1.40)
HCT: 29 % — ABNORMAL LOW (ref 39.0–52.0)
Hemoglobin: 9.9 g/dL — ABNORMAL LOW (ref 13.0–17.0)
O2 Saturation: 100 %
Potassium: 3.2 mmol/L — ABNORMAL LOW (ref 3.5–5.1)
Sodium: 128 mmol/L — ABNORMAL LOW (ref 135–145)
TCO2: 26 mmol/L (ref 22–32)
pCO2 arterial: 44.4 mmHg (ref 32–48)
pH, Arterial: 7.35 (ref 7.35–7.45)
pO2, Arterial: 281 mmHg — ABNORMAL HIGH (ref 83–108)

## 2024-12-20 LAB — GLUCOSE, CAPILLARY
Glucose-Capillary: 123 mg/dL — ABNORMAL HIGH (ref 70–99)
Glucose-Capillary: 135 mg/dL — ABNORMAL HIGH (ref 70–99)
Glucose-Capillary: 76 mg/dL (ref 70–99)
Glucose-Capillary: 86 mg/dL (ref 70–99)
Glucose-Capillary: 98 mg/dL (ref 70–99)

## 2024-12-20 LAB — COOXEMETRY PANEL
Carboxyhemoglobin: 2.3 % — ABNORMAL HIGH (ref 0.5–1.5)
Carboxyhemoglobin: 2.3 % — ABNORMAL HIGH (ref 0.5–1.5)
Methemoglobin: <0.7 % (ref 0.0–1.5)
Methemoglobin: <0.7 % (ref 0.0–1.5)
O2 Saturation: 50.9 %
O2 Saturation: 58 %
Total hemoglobin: 8.4 g/dL — ABNORMAL LOW (ref 12.0–16.0)
Total hemoglobin: 8.1 g/dL — ABNORMAL LOW (ref 12.0–16.0)

## 2024-12-20 LAB — PROTIME-INR
INR: 3.6 — ABNORMAL HIGH (ref 0.8–1.2)
Prothrombin Time: 37.8 s — ABNORMAL HIGH (ref 11.4–15.2)

## 2024-12-20 LAB — MAGNESIUM: Magnesium: 2.6 mg/dL — ABNORMAL HIGH (ref 1.7–2.4)

## 2024-12-20 LAB — LACTIC ACID, PLASMA: Lactic Acid, Venous: 3.7 mmol/L (ref 0.5–1.9)

## 2024-12-20 MED ORDER — MIDAZOLAM HCL (PF) 2 MG/2ML IJ SOLN
2.0000 mg | INTRAMUSCULAR | Status: DC | PRN
  Administered 2024-12-21 (×2): 2 mg via INTRAVENOUS
  Filled 2024-12-20: qty 2

## 2024-12-20 MED ORDER — NOREPINEPHRINE 4 MG/250ML-% IV SOLN
0.0000 ug/min | INTRAVENOUS | Status: DC
  Administered 2024-12-20: 5 ug/min via INTRAVENOUS

## 2024-12-20 MED ORDER — POTASSIUM CHLORIDE 10 MEQ/100ML IV SOLN
10.0000 meq | INTRAVENOUS | Status: AC
  Administered 2024-12-20 – 2024-12-21 (×5): 10 meq via INTRAVENOUS
  Filled 2024-12-20 (×3): qty 100

## 2024-12-20 MED ORDER — METOLAZONE 5 MG PO TABS
5.0000 mg | ORAL_TABLET | Freq: Once | ORAL | Status: AC
  Administered 2024-12-20: 5 mg via ORAL
  Filled 2024-12-20: qty 1

## 2024-12-20 MED ORDER — FENTANYL CITRATE (PF) 50 MCG/ML IJ SOSY
50.0000 ug | PREFILLED_SYRINGE | INTRAMUSCULAR | Status: DC | PRN
  Administered 2024-12-21 – 2024-12-22 (×3): 50 ug via INTRAVENOUS

## 2024-12-20 MED ORDER — POTASSIUM CHLORIDE CRYS ER 20 MEQ PO TBCR
40.0000 meq | EXTENDED_RELEASE_TABLET | Freq: Two times a day (BID) | ORAL | Status: DC
  Administered 2024-12-20: 40 meq via ORAL
  Filled 2024-12-20: qty 2

## 2024-12-20 MED ORDER — AMIODARONE IV BOLUS ONLY 150 MG/100ML
150.0000 mg | Freq: Once | INTRAVENOUS | Status: AC
  Administered 2024-12-20: 150 mg via INTRAVENOUS
  Filled 2024-12-20: qty 100

## 2024-12-20 MED ORDER — AMIODARONE HCL IN DEXTROSE 360-4.14 MG/200ML-% IV SOLN
60.0000 mg/h | INTRAVENOUS | Status: AC
  Administered 2024-12-21: 60 mg/h via INTRAVENOUS
  Filled 2024-12-20 (×2): qty 200

## 2024-12-20 MED ORDER — PERFLUTREN LIPID MICROSPHERE
1.0000 mL | INTRAVENOUS | Status: AC | PRN
  Administered 2024-12-20: 2 mL via INTRAVENOUS

## 2024-12-20 MED ORDER — OXYMETAZOLINE HCL 0.05 % NA SOLN
1.0000 | Freq: Two times a day (BID) | NASAL | Status: AC
  Administered 2024-12-20 – 2024-12-21 (×2): 1 via NASAL
  Filled 2024-12-20 (×2): qty 30

## 2024-12-20 MED ORDER — PANTOPRAZOLE SODIUM 40 MG IV SOLR
40.0000 mg | Freq: Every day | INTRAVENOUS | Status: DC
  Administered 2024-12-20 – 2024-12-22 (×3): 40 mg via INTRAVENOUS
  Filled 2024-12-20 (×3): qty 10

## 2024-12-20 MED ORDER — MAGNESIUM SULFATE 2 GM/50ML IV SOLN
2.0000 g | Freq: Once | INTRAVENOUS | Status: AC
  Administered 2024-12-20: 2 g via INTRAVENOUS
  Filled 2024-12-20: qty 50

## 2024-12-20 MED ORDER — FUROSEMIDE 10 MG/ML IJ SOLN
200.0000 mg | Freq: Once | INTRAVENOUS | Status: AC
  Administered 2024-12-20: 200 mg via INTRAVENOUS
  Filled 2024-12-20: qty 20

## 2024-12-20 MED ORDER — FENTANYL 2500MCG IN NS 250ML (10MCG/ML) PREMIX INFUSION
0.0000 ug/h | INTRAVENOUS | Status: DC
  Administered 2024-12-20: 50 ug/h via INTRAVENOUS
  Administered 2024-12-22: 25 ug/h via INTRAVENOUS
  Filled 2024-12-20 (×2): qty 250

## 2024-12-20 MED ORDER — EPINEPHRINE HCL 5 MG/250ML IV SOLN IN NS
0.5000 ug/min | INTRAVENOUS | Status: DC
  Administered 2024-12-20: 0.5 ug/min via INTRAVENOUS
  Administered 2024-12-21: 10 ug/min via INTRAVENOUS
  Administered 2024-12-21: 8 ug/min via INTRAVENOUS
  Administered 2024-12-22: 9 ug/min via INTRAVENOUS
  Administered 2024-12-22 – 2024-12-23 (×2): 7 ug/min via INTRAVENOUS
  Filled 2024-12-20 (×7): qty 250

## 2024-12-20 MED ORDER — AMIODARONE HCL IN DEXTROSE 360-4.14 MG/200ML-% IV SOLN
30.0000 mg/h | INTRAVENOUS | Status: AC
  Administered 2024-12-21 (×2): 30 mg/h via INTRAVENOUS
  Filled 2024-12-20 (×3): qty 200

## 2024-12-20 NOTE — Procedures (Signed)
 Intubation Procedure Note  Ryan [name]  [id number]  1966/09/22  Date:12/20/24  Time:6:33 PM   Provider Performing:Yoon Barca, VEVA    Procedure: Intubation (31500)  Indication(s) Respiratory Failure  Consent Unable to obtain consent due to emergent nature of procedure.   Anesthesia None   Time Out Verified patient identification, verified procedure, site/side was marked, verified correct patient position, special equipment/implants available, medications/allergies/relevant history reviewed, required imaging and test results available.   Sterile Technique Usual hand hygeine, masks, and gloves were used   Procedure Description Patient positioned in bed supine.  Sedation given as noted above.  Patient was intubated with endotracheal tube using Glidescope.  View was Grade 1 full glottis .  Number of attempts was 1.  Colorimetric CO2 detector was consistent with tracheal placement.   Complications/Tolerance None; patient tolerated the procedure well. Chest X-ray is ordered to verify placement.   EBL Minimal   Specimen(s) None

## 2024-12-20 NOTE — Significant Event (Signed)
 Rapid Response Event Note   Reason for Call :  Code Blue paged [phone]  Initial Focused Assessment:  Patient on floor at doorway of bathroom, compressions ongoing with zollpads in place. PEA. Per report, CCMD called with reports of bradycardia in 20s. Looking at vitals, pt began bradying 1740. ACLS ongoing, see code sheet. Intubated. OG placed to attempt decompressing abdomen. ROSC 1803. 151/89. HR 102. Safely moved to bed. Transferred to 2H25 with CCM at bedside.    Tonna Chiquita POUR, RN

## 2024-12-20 NOTE — Consult Note (Signed)
 "  NAME:  Ryan [name], MRN:  [id number], DOB:  Dec 13, 1965, LOS: 2 ADMISSION DATE:  12/18/2024, CONSULTATION DATE:  12/20/24 REFERRING MD: [facility], CHIEF COMPLAINT: Cardiac arrest  History of Present Illness:  Ryan [name] is a 58 year old male with a past medical history significant for combined CHF, emphysema, chronic respiratory failure, paroxysmal A-fib, HTN, HLD, prior CABG with CAD, PAD, and carotid artery disease who initially presented to the ED at [facility] 1/15 for worsening shortness of breath and edema.  Admission workup consistent with CHF exacerbation for which patient was admitted to [facility]  Late afternoon 1/17 patient suffered in-hospital cardiac arrest with estimated downtime of 16 minutes.  PCCM consulted for further assistance postarrest  Pertinent  Medical History  CHF, emphysema, chronic respiratory failure, paroxysmal A-fib, HTN, HLD, prior CABG with CAD, PAD, and carotid artery disease   Significant Hospital Events: Including procedures, antibiotic start and stop dates in addition to other pertinent events   1/15 presented with increased work of breathing and edema admitted for acute exacerbation CHF 1/17 suffered an hospital bradycardic to PEA cardiac arrest, estimated downtime 16 minutes  Interim History / Subjective:  Unresponsive postarrest  Objective    Blood pressure (!) 104/59, pulse 88, temperature 98.7 F (37.1 C), temperature source Oral, resp. rate 18, height 5' 9 (1.753 m), weight 105 kg, SpO2 100%. CVP:  [9 mmHg-32 mmHg] 9 mmHg  Vent Mode: PRVC FiO2 (%):  [36 %-100 %] 100 % Set Rate:  [22 bmp] 22 bmp Vt Set:  [565 mL] 565 mL PEEP:  [5 cmH20] 5 cmH20   Intake/Output Summary (Last 24 hours) at 12/20/2024 1834 Last data filed at 12/20/2024 1608 Gross per 24 hour  Intake 1129.05 ml  Output 2250 ml  Net -1120.95 ml   Filed Weights   12/18/24 1223 12/19/24 0300 12/20/24 0440  Weight: 102.1 kg 102.5 kg 105 kg    Examination: General: Acute on chronic  ill-appearing middle-age male lying in bed on mechanical ventilation postarrest HEENT: ETT, MM pink/moist, PERRL,  Neuro: Unresponsive  CV: s1s2 regular rate and rhythm, no murmur, rubs, or gallops,  PULM: Agonal respirations on ventilator GI: soft, bowel sounds active in all 4 quadrants, non-tender, non-distended Extremities: warm/dry, no edema  Skin: no rashes or lesions   Resolved problem list   Assessment and Plan  In-hospital cardiac arrest -Initially found down in bathroom, was notified by central jelly for developing bradycardia prior to being found on the ground.  Estimated downtime 16 minutes P: Ongoing workup for underlying cause for arrest Repeat labs to review electrolyte Continuous telemetry Obtain central access  Acute on chronic combined systolic congestive heart failure - Echo 1/17 with EF 35 to 40%, no observed apical thrombus on this echo there was concern for 1 day prior, RV function severely reduced CAD s/p CABG P: Primary management per heart failure Continuous telemetry  Strict intake and output  Daily weight to assess volume status Daily assessment for need to diurese  Closely monitor renal function and electrolytes  Vent support as above Ensure hemodynamic control GDMT limited due to poor hemodynamics  Acute on chronic hypoxic respiratory failure in the setting of cardiac arrest History of COPD -Intubated during arrest  -Utilizes 2 to 2-1/2 L of oxygen P: Continue ventilator support with lung protective strategies  Wean PEEP and FiO2 for sats greater than 90%. Head of bed elevated 30 degrees. Plateau pressures less than 30 cm H20.  Follow intermittent chest x-ray and ABG.   SAT/SBT as tolerated,  mentation preclude extubation  Ensure adequate pulmonary hygiene  Follow cultures  VAP bundle in place  PAD protocol  Atrial fibs/atrial fibrillation -S/p flutter ablation 12/25 P: Optimize electrolytes Continuous telemetry Hold Eliquis  until we  can confirm no traumas related to fall during arrest  PAD with carotid and femoral disease P: Hold anticoagulation until head CT as above  Type 2 diabetes P: Continue SSI CBG goal 1 4180 CBG checks every 4  Hyponatremia P: Trend  Hypertension Hyperlipidemia P: Hold home medications postarrest   Labs   CBC: Recent Labs  Lab 12/18/24 1237 12/19/24 0259 12/20/24 0229  WBC 8.0 7.3 10.3  HGB 9.2* 8.3* 8.3*  HCT 31.2* 28.6* 28.3*  MCV 76.5* 77.5* 75.9*  PLT 241 215 228    Basic Metabolic Panel: Recent Labs  Lab 12/18/24 1515 12/19/24 0259 12/19/24 0301 12/19/24 1434 12/20/24 0229 12/20/24 0230  NA  --  131* 132* 127* 127* 127*  K  --  2.9* 2.9* 3.7 3.7 3.7  CL  --  87* 87* 87* 86* 86*  CO2  --  33* 33* 30 30 30   GLUCOSE  --  225* 223* 177* 127* 126*  BUN  --  48* 48* 44* 43* 44*  CREATININE  --  1.51* 1.50* 1.61* 1.63* 1.61*  CALCIUM   --  8.4* 8.5* 8.4* 8.4* 8.5*  MG 2.5* 2.7*  --   --  2.6*  --   PHOS  --   --  4.3  --   --  4.2   GFR: Estimated Creatinine Clearance: 59.7 mL/min (A) (by C-G formula based on SCr of 1.61 mg/dL (H)). Recent Labs  Lab 12/18/24 1237 12/19/24 0259 12/20/24 0229  WBC 8.0 7.3 10.3    Liver Function Tests: Recent Labs  Lab 12/19/24 0259 12/19/24 0301 12/20/24 0230  AST 26  --   --   ALT 15  --   --   ALKPHOS 379*  --   --   BILITOT 1.2  --   --   PROT 7.2  --   --   ALBUMIN  3.4* 3.4* 3.5   No results for input(s): LIPASE, AMYLASE in the last 168 hours. No results for input(s): AMMONIA in the last 168 hours.  ABG    Component Value Date/Time   PHART 7.373 05/05/2020 2144   PCO2ART 40.7 05/05/2020 2144   PO2ART 69 (L) 05/05/2020 2144   HCO3 24.3 03/01/2024 0637   TCO2 23 03/01/2024 0649   ACIDBASEDEF 2.0 05/05/2020 2144   O2SAT 50.9 12/20/2024 0415     Coagulation Profile: No results for input(s): INR, PROTIME in the last 168 hours.  Cardiac Enzymes: No results for input(s): CKTOTAL,  CKMB, CKMBINDEX, TROPONINI in the last 168 hours.  HbA1C: Hgb A1c MFr Bld  Date/Time Value Ref Range Status  07/18/2024 06:18 AM 12.3 (H) 4.8 - 5.6 % Final    Comment:    (NOTE) Diagnosis of Diabetes The following HbA1c ranges recommended by the American Diabetes Association (ADA) may be used as an aid in the diagnosis of diabetes mellitus.  Hemoglobin             Suggested A1C NGSP%              Diagnosis  <5.7                   Non Diabetic  5.7-6.4                Pre-Diabetic  >6.4  Diabetic  <7.0                   Glycemic control for                       adults with diabetes.    03/01/2024 10:37 AM 8.9 (H) 4.8 - 5.6 % Final    Comment:    (NOTE) Pre diabetes:          5.7%-6.4%  Diabetes:              >6.4%  Glycemic control for   <7.0% adults with diabetes     CBG: Recent Labs  Lab 12/19/24 1631 12/19/24 2123 12/20/24 0638 12/20/24 1216 12/20/24 1600  GLUCAP 128* 113* 135* 123* 98    Review of Systems:   Unable to assess   Past Medical History:  He,  has a past medical history of Acute myocardial infarction of other anterior wall, subsequent episode of care (12/12/2011), Anal fissure, Arthritis, Atherosclerosis of native artery of both lower extremities with intermittent claudication (04/01/2018), Bruises easily, CAD (coronary artery disease), Cardiomyopathy secondary (12/26/2011), Chronic systolic CHF (congestive heart failure) (HCC), Chronic total occlusion of artery of the extremities (02/26/2012), Claudication (12/26/2011), Coronary atherosclerosis of native coronary artery, Diabetes mellitus without complication (HCC), Essential hypertension (10/23/2016), GERD (gastroesophageal reflux disease), History of blood transfusion, History of kidney stones, Hypertension, Ischemic cardiomyopathy, MI (myocardial infarction) (HCC), Mixed hyperlipidemia, NSTEMI (non-ST elevation myocardial infarction) (HCC) (03/26/2023), Numbness and  tingling of right arm (03/31/2013), Occlusion and stenosis of carotid artery without mention of cerebral infarction (08/26/2012), PAD (peripheral artery disease), Paroxysmal atrial flutter (HCC) (07/20/2024), Peripheral vascular disease, unspecified (12/16/2012), PVD (peripheral vascular disease) (03/31/2013), Rectal polyp, ST elevation myocardial infarction involving left anterior descending (LAD) coronary artery (HCC) (07/31/2017), Subclavian steal syndrome (08/11/2013), Syncope (07/20/2024), Uncontrolled diabetes mellitus (10/23/2016), Unstable angina (HCC), and Wears partial dentures.   Surgical History:   Past Surgical History:  Procedure Laterality Date   A-FLUTTER ABLATION N/A 05/05/2024   Procedure: A-FLUTTER ABLATION;  Surgeon: [name], MD;  Location[facility] INVASIVE CV LAB;  Service: Cardiovascular;  Laterality: N/A;   ABDOMINAL AORTOGRAM W/LOWER EXTREMITY N/A 06/26/2017   Procedure: Abdominal Aortogram w/Lower Extremity;  Surgeon: Serene Gaile ORN, MD;  Location: [facility] INVASIVE CV LAB;  Service: Cardiovascular;  Laterality: N/A;   ABDOMINAL AORTOGRAM W/LOWER EXTREMITY N/A 06/28/2021   Procedure: ABDOMINAL AORTOGRAM W/LOWER EXTREMITY;  Surgeon: Serene Gaile ORN, MD;  Location: [facility] INVASIVE CV LAB;  Service: Cardiovascular;  Laterality: N/A;   ABDOMINAL AORTOGRAM W/LOWER EXTREMITY N/A 01/02/2023   Procedure: ABDOMINAL AORTOGRAM W/LOWER EXTREMITY;  Surgeon: Serene Gaile ORN, MD;  Location: [facility] INVASIVE CV LAB;  Service: Cardiovascular;  Laterality: N/A;   ANAL FISSURECTOMY     AORTA - BILATERAL FEMORAL ARTERY BYPASS GRAFT N/A 01/16/2013   Procedure: AORTA BIFEMORAL BYPASS GRAFT;  Surgeon: Gaile ORN Serene, MD;  Location: [facility] OR;  Service: Vascular;  Laterality: N/A;   APPENDECTOMY     ATRIAL FIBRILLATION ABLATION N/A 11/12/2024   Procedure: ATRIAL FIBRILLATION ABLATION;  Surgeon: [name], MD;  Location: [facility] INVASIVE CV LAB;  Service: Cardiovascular;  Laterality: N/A;   CARDIAC CATHETERIZATION  N/A 10/23/2016   Procedure: Left Heart Cath and Coronary Angiography;  Surgeon: Lonni JONETTA Cash, MD;  Location: [facility] INVASIVE CV LAB;  Service: Cardiovascular;  Laterality: N/A;   CARDIAC CATHETERIZATION N/A 10/23/2016   Procedure: Coronary Balloon Angioplasty;  Surgeon: Lonni JONETTA Cash, MD;  Location: [facility] INVASIVE CV LAB;  Service: Cardiovascular;  Laterality: N/A;   CARDIOVERSION N/A 03/31/2024   Procedure: CARDIOVERSION;  Surgeon: Zenaida Morene PARAS, MD;  Location: [facility] INVASIVE CV LAB;  Service: Cardiovascular;  Laterality: N/A;   CAROTID-SUBCLAVIAN BYPASS GRAFT Left 04/03/2013   Procedure: BYPASS GRAFT CAROTID-SUBCLAVIAN;  Surgeon: Gaile LELON New, MD;  Location: [facility] OR;  Service: Vascular;  Laterality: Left;   CIRCUMCISION N/A 12/15/2015   Procedure: CIRCUMCISION ADULT;  Surgeon: Belvie LITTIE Clara, MD;  Location: [facility] ORS;  Service: Urology;  Laterality: N/A;   COLONOSCOPY     CORONARY ANGIOPLASTY     stent placed Dec 12, 2011 and 2018   CORONARY ARTERY BYPASS GRAFT N/A 05/05/2020   Procedure: CORONARY ARTERY BYPASS GRAFTING (CABG) x Three, Using Left internal Mammary Artery and Left Leg greater saphenous vein harvested endoscopically;  Surgeon: Army Dallas NOVAK, MD;  Location: [facility] OR;  Service: Open Heart Surgery;  Laterality: N/A;   CORONARY BALLOON ANGIOPLASTY N/A 05/23/2019   Procedure: CORONARY BALLOON ANGIOPLASTY;  Surgeon: Verlin Lonni BIRCH, MD;  Location: [facility] INVASIVE CV LAB;  Service: Cardiovascular;  Laterality: N/A;   CORONARY PRESSURE/FFR STUDY N/A 04/22/2020   Procedure: INTRAVASCULAR PRESSURE WIRE/FFR STUDY;  Surgeon: Verlin Lonni BIRCH, MD;  Location: [facility] INVASIVE CV LAB;  Service: Cardiovascular;  Laterality: N/A;   DIAGNOSTIC LAPAROSCOPY     ENDARTERECTOMY FEMORAL Right 03/13/2018   Procedure: FEMORAL ENDARTERECTOMY RIGHT;  Surgeon: New Gaile LELON, MD;  Location: [facility] OR;  Service: Vascular;  Laterality: Right;   ENDARTERECTOMY FEMORAL Left 01/26/2023   Procedure:  REDO LEFT COMMON FEMORAL ENDARTERECTOMY WITH VPA;  Surgeon: New Gaile LELON, MD;  Location: [facility] OR;  Service: Vascular;  Laterality: Left;   FEMORAL-POPLITEAL BYPASS GRAFT Right 03/13/2018   Procedure: FEMORAL-POPLITEAL ARTERY BYPASS WITH NON-REVERSE VEIN RIGHT;  Surgeon: New Gaile LELON, MD;  Location: [facility] OR;  Service: Vascular;  Laterality: Right;   GROIN DISSECTION Right 03/13/2018   Procedure: RE-DO COMMON FEMORAL ARTERY EXPOSURE;  Surgeon: New Gaile LELON, MD;  Location: [facility] OR;  Service: Vascular;  Laterality: Right;   I & D EXTREMITY Left 04/18/2013   Procedure: debridement of left neck lymphocele;  Surgeon: Gaile LELON New, MD;  Location: [facility] OR;  Service: Vascular;  Laterality: Left;  I and D of left neck   I & D EXTREMITY Right 11/21/2014   Procedure: IRRIGATION AND DEBRIDEMENT RIGHT HAND;  Surgeon: Elsie Mussel, MD;  Location: [facility] OR;  Service: Orthopedics;  Laterality: Right;   LEFT HEART CATH AND CORONARY ANGIOGRAPHY N/A 05/23/2019   Procedure: LEFT HEART CATH AND CORONARY ANGIOGRAPHY;  Surgeon: Verlin Lonni BIRCH, MD;  Location: [facility] INVASIVE CV LAB;  Service: Cardiovascular;  Laterality: N/A;   LEFT HEART CATH AND CORONARY ANGIOGRAPHY N/A 04/22/2020   Procedure: LEFT HEART CATH AND CORONARY ANGIOGRAPHY;  Surgeon: Verlin Lonni BIRCH, MD;  Location: [facility] INVASIVE CV LAB;  Service: Cardiovascular;  Laterality: N/A;   LEFT HEART CATHETERIZATION WITH CORONARY ANGIOGRAM N/A 12/12/2011   Procedure: LEFT HEART CATHETERIZATION WITH CORONARY ANGIOGRAM;  Surgeon: Lonni BIRCH Verlin, MD;  Location: [facility] CATH LAB;  Service: Cardiovascular;  Laterality: N/A;   LOOP RECORDER INSERTION N/A 05/05/2024   Procedure: LOOP RECORDER INSERTION;  Surgeon: [name], MD;  Location: [facility] INVASIVE CV LAB;  Service: Cardiovascular;  Laterality: N/A;   LOWER EXTREMITY ANGIOGRAM N/A 01/31/2012   Procedure: LOWER EXTREMITY ANGIOGRAM;  Surgeon: Lonni BIRCH Verlin, MD;  Location: [facility] CATH LAB;  Service:  Cardiovascular;  Laterality: N/A;   LOWER EXTREMITY ANGIOGRAPHY N/A 04/23/2018   Procedure:  LOWER EXTREMITY ANGIOGRAPHY;  Surgeon: Serene Gaile ORN, MD;  Location: [facility] INVASIVE CV LAB;  Service: Cardiovascular;  Laterality: N/A;   PATCH ANGIOPLASTY Left 01/26/2023   Procedure: PATCH ANGIOPLASTY USING HEMASHIELD PLATINUM FINESSE;  Surgeon: Serene Gaile ORN, MD;  Location: [facility] OR;  Service: Vascular;  Laterality: Left;   PERCUTANEOUS CORONARY STENT INTERVENTION (PCI-S) Right 12/12/2011   Procedure: PERCUTANEOUS CORONARY STENT INTERVENTION (PCI-S);  Surgeon: Lonni JONETTA Cash, MD;  Location: [facility] CATH LAB;  Service: Cardiovascular;  Laterality: Right;   PERIPHERAL VASCULAR BALLOON ANGIOPLASTY Right 06/28/2021   Procedure: PERIPHERAL VASCULAR BALLOON ANGIOPLASTY;  Surgeon: Serene Gaile ORN, MD;  Location: [facility] INVASIVE CV LAB;  Service: Cardiovascular;  Laterality: Right;  common femoral (graft)   PERIPHERAL VASCULAR BALLOON ANGIOPLASTY  01/02/2023   Procedure: PERIPHERAL VASCULAR BALLOON ANGIOPLASTY;  Surgeon: Serene Gaile ORN, MD;  Location: [facility] INVASIVE CV LAB;  Service: Cardiovascular;;   PERIPHERAL VASCULAR INTERVENTION Left 04/23/2018   Procedure: PERIPHERAL VASCULAR INTERVENTION;  Surgeon: Serene Gaile ORN, MD;  Location: [facility] INVASIVE CV LAB;  Service: Cardiovascular;  Laterality: Left;  superficial femoral   REPAIR EXTENSOR TENDON Right 11/21/2014   Procedure: WITH REPAIR/RECONSTRUCTION OF EXTENSOR TENDONS AS NEEDED;  Surgeon: Elsie Mussel, MD;  Location: [facility] OR;  Service: Orthopedics;  Laterality: Right;   TEE WITHOUT CARDIOVERSION N/A 05/05/2020   Procedure: TRANSESOPHAGEAL ECHOCARDIOGRAM (TEE);  Surgeon: Army Dallas NOVAK, MD;  Location: [facility] OR;  Service: Open Heart Surgery;  Laterality: N/A;     Social History:   reports that he has quit smoking. His smoking use included cigarettes. He has a 6.3 pack-year smoking history. He has never used smokeless tobacco. He reports current alcohol  use of about  2.0 standard drinks of alcohol  per week. He reports that he does not use drugs.   Family History:  His family history includes COPD in his mother; Cancer in his father and maternal uncle; Diabetes in his mother.   Allergies Allergies[1]   Home Medications  Prior to Admission medications  Medication Sig Start Date End Date Taking? Authorizing Provider  albuterol  (VENTOLIN  HFA) 108 (90 Base) MCG/ACT inhaler Inhale 1-2 puffs into the lungs every 6 (six) hours as needed for wheezing or shortness of breath.   Yes [provider]  cetirizine (ZYRTEC) 10 MG tablet Take 10 mg by mouth daily as needed for allergies. 06/02/21  Yes [provider]  clopidogrel  (PLAVIX ) 75 MG tablet TAKE 1 TABLET BY MOUTH EVERY DAY 09/01/24  Yes Cash Lonni JONETTA, MD  diazepam  (VALIUM ) 10 MG tablet Take 10 mg by mouth at bedtime. 03/10/21  Yes [provider]  ELIQUIS  5 MG TABS tablet TAKE 1 TABLET BY MOUTH 2 TIMES A DAY 09/29/24  Yes [name], MD  ezetimibe  (ZETIA ) 10 MG tablet TAKE 1 TABLET BY MOUTH EACH DAY 03/07/24  Yes Weaver, Scott T, PA-C  Furosemide  (FUROSCIX ) 80 MG/10ML CTKT Inject 80 mg into the skin once daily as directed. 12/15/24  Yes Milford, [name], FNP  gabapentin  (NEURONTIN ) 300 MG capsule Take 300 mg by mouth 3 (three) times daily. 03/25/20  Yes [provider]  icosapent  Ethyl (VASCEPA ) 1 g capsule TAKE 2 CAPSULES BY MOUTH TWICE DAILY 05/30/24  Yes Weaver, Scott T, PA-C  insulin  glargine (LANTUS ) 100 UNIT/ML Solostar Pen Inject 50 Units into the skin 2 (two) times daily.   Yes [provider]  insulin  lispro (HUMALOG ) 100 UNIT/ML injection Inject 0-15 Units into the skin 3 (three) times daily as needed for high blood sugar.  Yes [provider]  methocarbamol  (ROBAXIN ) 500 MG tablet Take 500 mg by mouth every 6 (six) hours as needed for muscle spasms. 11/05/24  Yes [provider]  nitroGLYCERIN  (NITROSTAT ) 0.4 MG SL tablet Place 0.4  mg under the tongue every 5 (five) minutes as needed for chest pain.   Yes [provider]  oxyCODONE -acetaminophen  (PERCOCET) 10-325 MG tablet Take 1 tablet by mouth 4 (four) times daily.   Yes [provider]  pantoprazole  (PROTONIX ) 40 MG tablet Take 40 mg by mouth daily. 03/07/24  Yes [provider]  potassium chloride  SA (KLOR-CON  M) 20 MEQ tablet Take 3 tablets (60 mEq total) by mouth 2 (two) times daily. Take an extra 2 tablets (40 meq total) when you take Metolazone . 12/01/24  Yes Milford, [name], FNP  pseudoephedrine (SUDAFED) 30 MG tablet Take 30 mg by mouth every 4 (four) hours as needed for congestion.   Yes [provider]  ramelteon  (ROZEREM ) 8 MG tablet TAKE 1 TABLET (8 MG TOTAL) BY MOUTH AT BEDTIME. 12/01/24  Yes Olalere, Adewale A, MD  REPATHA  SURECLICK 140 MG/ML SOAJ INJECT 140 MG INTO THE SKIN EVERY 14 DAYS AS DIRECTED 05/30/24  Yes [name], MD  Tiotropium Bromide-Olodaterol (STIOLTO RESPIMAT ) 2.5-2.5 MCG/ACT AERS INHALE 2 PUFFS INTO THE LUNGS DAILY. 12/01/24  Yes Olalere, Adewale A, MD  torsemide  (DEMADEX ) 20 MG tablet Take 4 tablets (80 mg total) by mouth 2 (two) times daily. 11/28/24  Yes Clegg, [name], NP  traZODone  (DESYREL ) 100 MG tablet Take 100 mg by mouth at bedtime.   Yes [provider]  TRINTELLIX  20 MG TABS tablet Take 20 mg by mouth daily in the afternoon. 12/29/22  Yes [provider]  ACCU-CHEK GUIDE TEST test strip  12/01/24   [provider]  ADVOCATE INSULIN  SYRINGE 31G X 5/16 1 ML MISC  12/01/24   [provider]  GNP PEN NEEDLES 32G X 4 MM MISC  12/01/24   [provider]  metolazone  (ZAROXOLYN ) 5 MG tablet Take 1 tablet (5 mg total) by mouth daily. Patient not taking: Reported on 12/18/2024 12/15/24   Glena [name], FNP  OXYGEN Inhale 2 L into the lungs as needed (excessive movement).    [provider]     Critical care time:   CRITICAL CARE Performed by:  Kayleena Eke D. Harris   Total critical care time: 45 minutes  Critical care time was exclusive of separately billable procedures and treating other patients.  Critical care was necessary to treat or prevent imminent or life-threatening deterioration.  Critical care was time spent personally by me on the following activities: development of treatment plan with patient and/or surrogate as well as nursing, discussions with consultants, evaluation of patient's response to treatment, examination of patient, obtaining history from patient or surrogate, ordering and performing treatments and interventions, ordering and review of laboratory studies, ordering and review of radiographic studies, pulse oximetry and re-evaluation of patient's condition.  Katniss Weedman D. Harris, NP-C [facility] Personal contact information can be found on Amion  If no contact or response made please call [phone] 12/20/2024, 7:05 PM             [1]  Allergies Allergen Reactions   Metformin Anaphylaxis    Water  blisters between fingers   Hydrocodone  Hives and Nausea And Vomiting   "

## 2024-12-20 NOTE — Progress Notes (Signed)
 " PROGRESS NOTE    Ryan [name]  FMW:[id number] DOB: 09-17-1966 DOA: 12/18/2024 PCP: [name], MD  59/M with history of chronic combined CHF, emphysema, chronic respiratory failure, paroxysmal A-fib, hypertension dyslipidemia, CAD/CABG, PAD, carotid artery disease presented to the ED with worsening shortness of breath and edema, sent to the ER from heart failure clinic, recently treated with Furoscix  and metolazone  with minimal response.  In the ED sodium 132, potassium 3.0, creatinine 1.5, hemoglobin 9.2, proBNP 3670, troponin 49, chest x-ray with pulmonary edema and right-sided pleural effusion - Admitted, CHF team following, started on milrinone  yesterday, Lasix  gtt.  Subjective: -Starting to feel better  Assessment and Plan:  Acute on chronic combined systolic and diastolic CHF - Last echo 8/25 noted EF 45%, G2 DD, global hypokinesis, low normal RV systolic function. -Repeat echo with EF 40-45%, grade 2 DD, moderately reduced RV, cannot exclude LV thrombus, recommended limited echo with Definity , will order -CHF team following, now on Lasix  gtt. and milrinone  -Limited response thus far, add metolazone  X1 -Also check limited abdominal ultrasound for ascites - Continue Aldactone   Paroxysmal A-fib/flutter, recurrent -Prior ablation 6/25 -Currently in sinus rhythm -Continue Eliquis    COPD/chronic respiratory failure with hypoxia -On 2 to 2-1/2 L at home.   - Continue Brovana  and Incruse, PRN albuterol    Hyperlipidemia - Continue home Zetia  and Vascepa  - Takes Repatha  outpatient   CAD/CABG PAD: Status post aortobifem bypass and left subclavian bypass Carotid artery disease - Continue home Eliquis , Plavix  - Continue Vascepa , Zetia  - Takes Repatha  outpatient   DM2 -CBGs better controlled on glargine and meal coverage insulin    Obesity - Noted   Iron  deficiency anemia -Iron  sat down to 7%, given IV iron   Tobacco abuse Counseled   DVT prophylaxis:       Eliquis  Code Status:              Full Family Communication:       None on admission  Disposition Plan: Home pending improvement in volume status  Consultants: Cards   Procedures:   Antimicrobials:    Objective: Vitals:   12/20/24 0700 12/20/24 0747 12/20/24 0757 12/20/24 1021  BP:   (!) 103/53 110/60  Pulse:   98 91  Resp:   (!) 22 17  Temp:   98 F (36.7 C) (!) 97.1 F (36.2 C)  TempSrc:   Oral Oral  SpO2: 93% 100% 93% 90%  Weight:      Height:        Intake/Output Summary (Last 24 hours) at 12/20/2024 1112 Last data filed at 12/20/2024 0739 Gross per 24 hour  Intake 1424.81 ml  Output 2150 ml  Net -725.19 ml   Filed Weights   12/18/24 1223 12/19/24 0300 12/20/24 0440  Weight: 102.1 kg 102.5 kg 105 kg    Examination:  General exam: Chronically ill-appearing, AAO x 3 HEENT: Positive JVD CVS: S1-S2, regular rhythm Lungs: Bilateral crackles Abdomen: Distended, nontender, abdominal wall edema Extremities: 2+ edema    Data Reviewed:   CBC: Recent Labs  Lab 12/18/24 1237 12/19/24 0259 12/20/24 0229  WBC 8.0 7.3 10.3  HGB 9.2* 8.3* 8.3*  HCT 31.2* 28.6* 28.3*  MCV 76.5* 77.5* 75.9*  PLT 241 215 228   Basic Metabolic Panel: Recent Labs  Lab 12/18/24 1515 12/19/24 0259 12/19/24 0301 12/19/24 1434 12/20/24 0229 12/20/24 0230  NA  --  131* 132* 127* 127* 127*  K  --  2.9* 2.9* 3.7 3.7 3.7  CL  --  87* 87*  87* 86* 86*  CO2  --  33* 33* 30 30 30   GLUCOSE  --  225* 223* 177* 127* 126*  BUN  --  48* 48* 44* 43* 44*  CREATININE  --  1.51* 1.50* 1.61* 1.63* 1.61*  CALCIUM   --  8.4* 8.5* 8.4* 8.4* 8.5*  MG 2.5* 2.7*  --   --  2.6*  --   PHOS  --   --  4.3  --   --  4.2   GFR: Estimated Creatinine Clearance: 59.7 mL/min (A) (by C-G formula based on SCr of 1.61 mg/dL (H)). Liver Function Tests: Recent Labs  Lab 12/19/24 0259 12/19/24 0301 12/20/24 0230  AST 26  --   --   ALT 15  --   --   ALKPHOS 379*  --   --   BILITOT 1.2  --   --   PROT  7.2  --   --   ALBUMIN  3.4* 3.4* 3.5   No results for input(s): LIPASE, AMYLASE in the last 168 hours. No results for input(s): AMMONIA in the last 168 hours. Coagulation Profile: No results for input(s): INR, PROTIME in the last 168 hours. Cardiac Enzymes: No results for input(s): CKTOTAL, CKMB, CKMBINDEX, TROPONINI in the last 168 hours. BNP (last 3 results) Recent Labs    12/18/24 1237  PROBNP 3,670.0*   HbA1C: No results for input(s): HGBA1C in the last 72 hours. CBG: Recent Labs  Lab 12/19/24 0602 12/19/24 1127 12/19/24 1631 12/19/24 2123 12/20/24 0638  GLUCAP 205* 154* 128* 113* 135*   Lipid Profile: No results for input(s): CHOL, HDL, LDLCALC, TRIG, CHOLHDL, LDLDIRECT in the last 72 hours. Thyroid Function Tests: No results for input(s): TSH, T4TOTAL, FREET4, T3FREE, THYROIDAB in the last 72 hours. Anemia Panel: Recent Labs    12/19/24 0259  FERRITIN 179  TIBC 406  IRON  24*   Urine analysis:    Component Value Date/Time   COLORURINE YELLOW 03/01/2024 0625   APPEARANCEUR CLEAR 03/01/2024 0625   LABSPEC 1.009 03/01/2024 0625   PHURINE 6.0 03/01/2024 0625   GLUCOSEU >=500 (A) 03/01/2024 0625   HGBUR NEGATIVE 03/01/2024 0625   BILIRUBINUR NEGATIVE 03/01/2024 0625   KETONESUR NEGATIVE 03/01/2024 0625   PROTEINUR 100 (A) 03/01/2024 0625   UROBILINOGEN 0.2 04/04/2013 0520   NITRITE NEGATIVE 03/01/2024 0625   LEUKOCYTESUR NEGATIVE 03/01/2024 0625   Sepsis Labs: @LABRCNTIP (procalcitonin:4,lacticidven:4)  ) Recent Results (from the past 240 hours)  Resp panel by RT-PCR (RSV, Flu A&B, Covid) Anterior Nasal Swab     Status: None   Collection Time: 12/18/24 12:35 PM   Specimen: Anterior Nasal Swab  Result Value Ref Range Status   SARS Coronavirus 2 by RT PCR NEGATIVE NEGATIVE Final   Influenza A by PCR NEGATIVE NEGATIVE Final   Influenza B by PCR NEGATIVE NEGATIVE Final    Comment: (NOTE) The Xpert Xpress  SARS-CoV-2/FLU/RSV plus assay is intended as an aid in the diagnosis of influenza from Nasopharyngeal swab specimens and should not be used as a sole basis for treatment. Nasal washings and aspirates are unacceptable for Xpert Xpress SARS-CoV-2/FLU/RSV testing.  Fact Sheet for Patients: bloggercourse.com  Fact Sheet for Healthcare Providers: seriousbroker.it  This test is not yet approved or cleared by the [place]  FDA and has been authorized for detection and/or diagnosis of SARS-CoV-2 by FDA under an Emergency Use Authorization (EUA). This EUA will remain in effect (meaning this test can be used) for the duration of the COVID-19 declaration under Section 564(b)(1) of  the Act, 21 U.S.C. section 360bbb-3(b)(1), unless the authorization is terminated or revoked.     Resp Syncytial Virus by PCR NEGATIVE NEGATIVE Final    Comment: (NOTE) Fact Sheet for Patients: bloggercourse.com  Fact Sheet for Healthcare Providers: seriousbroker.it  This test is not yet approved or cleared by the [place]  FDA and has been authorized for detection and/or diagnosis of SARS-CoV-2 by FDA under an Emergency Use Authorization (EUA). This EUA will remain in effect (meaning this test can be used) for the duration of the COVID-19 declaration under Section 564(b)(1) of the Act, 21 U.S.C. section 360bbb-3(b)(1), unless the authorization is terminated or revoked.  Performed at [facility], [address], [place], [place] [address]      Radiology Studies: ECHOCARDIOGRAM COMPLETE Result Date: 12/19/2024    ECHOCARDIOGRAM REPORT   Patient Name:   TALLAN SANDOZ Date of Exam: 12/19/2024 Medical Rec #:  [id number]    Height:       69.0 in Accession #:    [id number]   Weight:       226.0 lb Date of Birth:  [birth year]   BSA:          2.176 m Patient Age:    58 years     BP:           116/87 mmHg  Patient Gender: M            HR:           85 bpm. Exam Location:  Inpatient Procedure: 2D Echo, Cardiac Doppler and Color Doppler (Both Spectral and Color            Flow Doppler were utilized during procedure). Indications:    CHF- Acute Systolic  History:        Patient has prior history of Echocardiogram examinations, most                 recent 07/18/2024. CHF and Cardiomyopathy, CAD; Risk                 Factors:Hypertension, Diabetes and Dyslipidemia.  Sonographer:    Sherlean Dubin Referring Phys: [id number] ALEXANDER B MELVIN IMPRESSIONS  1. Pooor acoustic windows limit study There is hypokinesis/akinesis of the distal and apical segments of LV Cannot exclude apical thrombus (frame 69) Would recommend limited echo to Definity  to further define. . Left ventricular ejection fraction, by estimation, is 40 to 45%. The left ventricle has mildly decreased function. Left ventricular diastolic parameters are consistent with Grade II diastolic dysfunction (pseudonormalization).  2. Right ventricular systolic function is moderately reduced. The right ventricular size is mildly enlarged.  3. Right atrial size was mildly dilated.  4. Mild mitral valve regurgitation.  5. The aortic valve is tricuspid. Aortic valve regurgitation is mild. Aortic valve sclerosis/calcification is present, without any evidence of aortic stenosis.  6. The inferior vena cava is dilated in size with <50% respiratory variability, suggesting right atrial pressure of 15 mmHg. FINDINGS  Left Ventricle: Pooor acoustic windows limit study There is hypokinesis/akinesis of the distal and apical segments of LV Cannot exclude apical thrombus (frame 69) Would recommend limited echo to Definity  to further define. Left ventricular ejection fraction, by estimation, is 40 to 45%. The left ventricle has mildly decreased function. The left ventricular internal cavity size was normal in size. There is no left ventricular hypertrophy. Left ventricular diastolic  parameters are consistent with Grade II diastolic dysfunction (pseudonormalization). Right Ventricle: The right ventricular size is mildly enlarged. Right  vetricular wall thickness was not assessed. Right ventricular systolic function is moderately reduced. Left Atrium: Left atrial size was normal in size. Right Atrium: Right atrial size was mildly dilated. Pericardium: Trivial pericardial effusion is present. Mitral Valve: There is mild thickening of the mitral valve leaflet(s). Mild mitral annular calcification. Mild mitral valve regurgitation. MV peak gradient, 4.5 mmHg. The mean mitral valve gradient is 1.0 mmHg. Tricuspid Valve: The tricuspid valve is normal in structure. Tricuspid valve regurgitation is mild. Aortic Valve: The aortic valve is tricuspid. Aortic valve regurgitation is mild. Aortic valve sclerosis/calcification is present, without any evidence of aortic stenosis. Aortic valve mean gradient measures 5.5 mmHg. Aortic valve peak gradient measures 10.2 mmHg. Aortic valve area, by VTI measures 1.49 cm. Pulmonic Valve: The pulmonic valve was normal in structure. Pulmonic valve regurgitation is mild. Aorta: The aortic root and ascending aorta are structurally normal, with no evidence of dilitation. Venous: The inferior vena cava is dilated in size with less than 50% respiratory variability, suggesting right atrial pressure of 15 mmHg. IAS/Shunts: No atrial level shunt detected by color flow Doppler.  LEFT VENTRICLE PLAX 2D LVIDd:         5.30 cm   Diastology LVIDs:         4.00 cm   LV e' medial:    5.37 cm/s LV PW:         0.90 cm   LV E/e' medial:  18.5 LV IVS:        1.00 cm   LV e' lateral:   9.38 cm/s LVOT diam:     1.80 cm   LV E/e' lateral: 10.6 LV SV:         42 LV SV Index:   19 LVOT Area:     2.54 cm  RIGHT VENTRICLE            IVC RV Basal diam:  4.00 cm    IVC diam: 2.30 cm RV Mid diam:    3.60 cm RV S prime:     4.56 cm/s TAPSE (M-mode): 1.2 cm LEFT ATRIUM             Index        RIGHT  ATRIUM           Index LA diam:        4.60 cm 2.11 cm/m   RA Area:     17.30 cm LA Vol (A2C):   69.1 ml 31.76 ml/m  RA Volume:   45.80 ml  21.05 ml/m LA Vol (A4C):   47.7 ml 21.92 ml/m LA Biplane Vol: 57.1 ml 26.24 ml/m  AORTIC VALVE AV Area (Vmax):    1.42 cm AV Area (Vmean):   1.43 cm AV Area (VTI):     1.49 cm AV Vmax:           160.00 cm/s AV Vmean:          105.000 cm/s AV VTI:            0.284 m AV Peak Grad:      10.2 mmHg AV Mean Grad:      5.5 mmHg LVOT Vmax:         89.10 cm/s LVOT Vmean:        58.950 cm/s LVOT VTI:          0.166 m LVOT/AV VTI ratio: 0.59  AORTA Ao Root diam: 2.70 cm Ao Asc diam:  3.00 cm MITRAL VALVE  TRICUSPID VALVE MV Area (PHT): 6.71 cm    TR Peak grad:   25.8 mmHg MV Area VTI:   1.39 cm    TR Vmax:        254.00 cm/s MV Peak grad:  4.5 mmHg MV Mean grad:  1.0 mmHg    SHUNTS MV Vmax:       1.06 m/s    Systemic VTI:  0.17 m MV Vmean:      54.9 cm/s   Systemic Diam: 1.80 cm MV Decel Time: 113 msec MR Peak grad: 12.3 mmHg MR Vmax:      175.50 cm/s MV E velocity: 99.50 cm/s MV A velocity: 35.10 cm/s MV E/A ratio:  2.83 Vina Gull MD Electronically signed by Vina Gull MD Signature Date/Time: 12/19/2024/5:19:54 PM    Final    DG CHEST PORT 1 VIEW Result Date: 12/18/2024 CLINICAL DATA:  Line EXAM: PORTABLE CHEST 1 VIEW COMPARISON:  12/18/2024, 11/17/2024, 03/13/2023 FINDINGS: Post sternotomy changes with chronic sternal wire fractures. Electronic recording device over the chest wall. Cardiomegaly with moderate right-sided slightly loculated effusion and small left effusion. Diffuse increased interstitial and ground-glass opacity, likely due to mild edema. Consolidation at the right lung base. Right upper extremity central venous catheter courses cranially over the right neck, the tip is non included on the image. IMPRESSION: 1. Right upper extremity central venous catheter courses cranially over the right neck, the tip is non included on the image. Recommend  repositioning. 2. Cardiomegaly with moderate right and small left effusions. Diffuse increased interstitial and ground-glass opacity, likely due to mild edema. Consolidation at the right lung base. These results will be called to the ordering clinician or representative by the Radiologist Assistant, and communication documented in the PACS or [employer]. Electronically Signed   By: Luke Bun M.D.   On: 12/18/2024 21:01   US  EKG SITE RITE Result Date: 12/18/2024 If Site Rite image not attached, placement could not be confirmed due to current cardiac rhythm.  DG Chest 2 View Result Date: 12/18/2024 EXAM: 2 VIEW(S) XRAY OF THE CHEST 12/18/2024 01:31:00 PM COMPARISON: 11/17/2024 CLINICAL HISTORY: sob FINDINGS: LINES, TUBES AND DEVICES: Left chest loop recorder noted. LUNGS AND PLEURA: Mild pulmonary edema. Moderate right pleural effusion with adjacent right basilar atelectasis. Trace left pleural effusion. No pneumothorax. HEART AND MEDIASTINUM: Stable cardiomediastinal silhouette with post-sternotomy and CABG changes. Similar cardiomegaly. BONES AND SOFT TISSUES: Similar discontinuity of sternotomy wires. IMPRESSION: 1. Mild pulmonary edema with moderate right pleural effusion and adjacent right basilar atelectasis. 2. Trace left pleural effusion. Electronically signed by: Taylor Stroud MD 12/18/2024 01:51 PM EST RP Workstation: [id number]     Scheduled Meds:  amiodarone   200 mg Oral BID   apixaban   5 mg Oral BID   arformoterol   15 mcg Nebulization BID   And   umeclidinium bromide   1 puff Inhalation Daily   Chlorhexidine  Gluconate Cloth  6 each Topical Daily   clopidogrel   75 mg Oral Daily   ezetimibe   10 mg Oral Daily   gabapentin   300 mg Oral BID   icosapent  Ethyl  2 g Oral BID   insulin  aspart  0-15 Units Subcutaneous TID WC   insulin  aspart  3 Units Subcutaneous TID WC   insulin  glargine  30 Units Subcutaneous BID   oxymetazoline   1 spray Each Nare BID   pantoprazole   40 mg Oral  Daily   potassium chloride   40 mEq Oral BID   ramelteon   8 mg Oral QHS  sodium chloride  flush  3 mL Intravenous Q12H   spironolactone   12.5 mg Oral Daily   Continuous Infusions:  furosemide  Stopped (12/18/24 1727)   furosemide  (LASIX ) 200 mg in dextrose  5 % 100 mL (2 mg/mL) infusion 20 mg/hr (12/20/24 1016)   milrinone  0.25 mcg/kg/min (12/20/24 0739)     LOS: 2 days    Time spent:    Sigurd Pac, MD [facility]   12/20/2024, 11:12 AM    "

## 2024-12-20 NOTE — Procedures (Signed)
 Arterial Catheter Insertion Procedure Note  Hajime Asfaw  [id number]  27-Jun-1966  Date:12/20/24  Time:7:49 PM    Provider Performing: [name] Jerika Wales    Procedure: Insertion of Arterial Line (63379) with US  guidance (23062)   Indication(s) Blood pressure monitoring and/or need for frequent ABGs  Consent Unable to obtain consent due to emergent nature of procedure.  Anesthesia None   Time Out Verified patient identification, verified procedure, site/side was marked, verified correct patient position, special equipment/implants available, medications/allergies/relevant history reviewed, required imaging and test results available.   Sterile Technique Maximal sterile technique including full sterile barrier drape, hand hygiene, sterile gown, sterile gloves, mask, hair covering, sterile ultrasound probe cover (if used).   Procedure Description Area of catheter insertion was cleaned with chlorhexidine  and draped in sterile fashion. With real-time ultrasound guidance an arterial catheter was placed into the right axillary artery.  Appropriate arterial tracings confirmed on monitor.     Complications/Tolerance None; patient tolerated the procedure well.   EBL Minimal   Specimen(s) None   [name] Tyteanna Ost, PA-C

## 2024-12-20 NOTE — Progress Notes (Signed)
 Transported off unit to ECHO lab at this time.  Escorted by: Patient Transporter and RN  Transport method: Bed

## 2024-12-20 NOTE — Procedures (Signed)
 I attempted to get a femoral arterial and central in the R groin. When I tried to stick the vein, pulsatile blood was flowing. The wire was difficult to advance. Given hx of fem pop bypass on R side on 03/13/2018, we decided to not place any lines in the groin. The introduce guide wire was removed and pressure was held for < 5 minutes. There was no bleeding. Will pursue arterial axillary line instead.

## 2024-12-20 NOTE — Progress Notes (Signed)
 MEWS Progress Note  Patient Details Name: Jimmylee Ratterree MRN: [id number] DOB: 10/08/1966 Today's Date: 12/20/2024   MEWS Flowsheet Documentation:  Assess: MEWS Score Temp: 98 F (36.7 C) BP: (!) 103/53 MAP (mmHg): 65 Pulse Rate: 98 ECG Heart Rate: (!) 102 Resp: (!) 22 Level of Consciousness: Alert SpO2: 93 % O2 Device: Nasal Cannula O2 Flow Rate (L/min): 4 L/min FiO2 (%): 36 % Assess: MEWS Score MEWS Temp: 0 MEWS Systolic: 0 MEWS Pulse: 1 MEWS RR: 1 MEWS LOC: 0 MEWS Score: 2 MEWS Score Color: Yellow Assess: SIRS CRITERIA SIRS Temperature : 0 SIRS Respirations : 1 SIRS Pulse: 1 SIRS WBC: 0 SIRS Score Sum : 2 Assess: if the MEWS score is Yellow or Red Were vital signs accurate and taken at a resting state?: Yes Does the patient meet 2 or more of the SIRS criteria?: Yes Does the patient have a confirmed or suspected source of infection?: No MEWS guidelines implemented : Yes, yellow Treat MEWS Interventions: Considered administering scheduled or prn medications/treatments as ordered Take Vital Signs Increase Vital Sign Frequency : Yellow: Q2hr x1, continue Q4hrs until patient remains green for 12hrs Escalate MEWS: Escalate: Yellow: Discuss with charge nurse and consider notifying provider and/or RRT Notify: Charge Nurse/RN Name of Charge Nurse/RN Notified: Devere PEAK      Niel Porter ORN 12/20/2024, 8:05 AM

## 2024-12-20 NOTE — Progress Notes (Signed)
" °   12/20/24 1847  Spiritual Encounters  Type of Visit Initial  Care provided to: Pt not available  Conversation partners present during encounter Nurse;Physician  Referral source Code page  Reason for visit Code  OnCall Visit Yes   Chaplain responded to a code blue. No family is present at this time. Per MD, family was able to be reached and they are on their way. Spiritual care services available as needed.   Juliene CHRISTELLA Das, Chaplain 12/20/24  "

## 2024-12-20 NOTE — Progress Notes (Signed)
 RT and RN transported pt from 2H25 to CT and back to 2H25 with no complications.

## 2024-12-20 NOTE — Progress Notes (Signed)
 Transported back to unit from ECHO lab after cardiac ultrasound.  Transport method: Bed Escorted by: Patient Transporter and RN

## 2024-12-20 NOTE — Plan of Care (Signed)
" °  Problem: Fluid Volume: Goal: Ability to maintain a balanced intake and output will improve Outcome: Progressing   Problem: Metabolic: Goal: Ability to maintain appropriate glucose levels will improve Outcome: Progressing   Problem: Nutritional: Goal: Maintenance of adequate nutrition will improve Outcome: Progressing   Problem: Tissue Perfusion: Goal: Adequacy of tissue perfusion will improve Outcome: Progressing   Problem: Education: Goal: Knowledge of General Education information will improve Description: Including pain rating scale, medication(s)/side effects and non-pharmacologic comfort measures Outcome: Progressing   Problem: Health Behavior/Discharge Planning: Goal: Ability to manage health-related needs will improve Outcome: Progressing   Problem: Fluid Volume: Goal: Ability to maintain a balanced intake and output will improve Outcome: Progressing   Problem: Metabolic: Goal: Ability to maintain appropriate glucose levels will improve Outcome: Progressing   Problem: Nutritional: Goal: Maintenance of adequate nutrition will improve Outcome: Progressing   Problem: Tissue Perfusion: Goal: Adequacy of tissue perfusion will improve Outcome: Progressing   Problem: Education: Goal: Knowledge of General Education information will improve Description: Including pain rating scale, medication(s)/side effects and non-pharmacologic comfort measures Outcome: Progressing   Problem: Health Behavior/Discharge Planning: Goal: Ability to manage health-related needs will improve Outcome: Progressing   "

## 2024-12-20 NOTE — Progress Notes (Signed)
 CCMD placed call to blue emergency phone.  1744 Patient found in bathroom, unresponsive on the floor. No pulse noted, compressions initiated and code blue called.  Code blue team at bedside.

## 2024-12-20 NOTE — Code Documentation (Addendum)
" °  Patient Name: Ryan [name]   MRN: [id number]   Date of Birth/ Sex: Mar 16, 1966 , male      Admission Date: 12/18/2024  Attending Provider: Fairy Frames, MD  Primary Diagnosis: Acute on chronic combined systolic and diastolic CHF (congestive heart failure) (HCC)   Indication: Pt was in his usual state of health until this PM, when he was noted to be found down in the bathroom. Code blue was subsequently called. At the time of arrival on scene, ACLS protocol was underway.   Technical Description:  - CPR performance duration:  19 minutes  - Was defibrillation or cardioversion used? No   - Was external pacer placed? No  - Was patient intubated pre/post CPR? Yes   Medications Administered: Y = Yes; Blank = No Amiodarone     Atropine     Calcium   Y  Epinephrine   Y  Lidocaine     Magnesium     Norepinephrine     Phenylephrine     Sodium bicarbonate   Y  Vasopressin      Post CPR evaluation:  - Final Status - Was patient successfully resuscitated ? Yes - What is current rhythm? Atrial fibrillation - What is current hemodynamic status? fair  Miscellaneous Information:  - Labs sent, including: CBC, BMP, PT/INR, Lactic Acid  - Primary team notified?  Yes  - Family Notified? Yes  - Additional notes/ transfer status: CTH non-con ordered and pending; sent to [facility] ICU     Waymond Cart, MD  12/20/2024, 6:26 PM  "

## 2024-12-20 NOTE — Progress Notes (Signed)
 Echocardiogram 2D Echocardiogram has been performed.  Koleen KANDICE Popper, RDCS 12/20/2024, 12:11 PM

## 2024-12-20 NOTE — Progress Notes (Signed)
 Called by Teaching resident during a Code, Pt was found down in the bathroom, unresponsive, pulseless. Code was called,PEA arrest, CPR performed for and intubated followed by ROSC. Intubated and transferred to 2H25 now. CCM team at bedside I called and updated Daughter Jonette Sigurd Pac, MD

## 2024-12-21 ENCOUNTER — Inpatient Hospital Stay (HOSPITAL_COMMUNITY)

## 2024-12-21 DIAGNOSIS — R569 Unspecified convulsions: Secondary | ICD-10-CM

## 2024-12-21 DIAGNOSIS — E785 Hyperlipidemia, unspecified: Secondary | ICD-10-CM | POA: Diagnosis not present

## 2024-12-21 DIAGNOSIS — E871 Hypo-osmolality and hyponatremia: Secondary | ICD-10-CM | POA: Diagnosis not present

## 2024-12-21 DIAGNOSIS — J9621 Acute and chronic respiratory failure with hypoxia: Secondary | ICD-10-CM

## 2024-12-21 DIAGNOSIS — I2510 Atherosclerotic heart disease of native coronary artery without angina pectoris: Secondary | ICD-10-CM | POA: Diagnosis not present

## 2024-12-21 DIAGNOSIS — I779 Disorder of arteries and arterioles, unspecified: Secondary | ICD-10-CM | POA: Diagnosis not present

## 2024-12-21 DIAGNOSIS — I110 Hypertensive heart disease with heart failure: Secondary | ICD-10-CM

## 2024-12-21 DIAGNOSIS — R4182 Altered mental status, unspecified: Secondary | ICD-10-CM | POA: Diagnosis not present

## 2024-12-21 DIAGNOSIS — J449 Chronic obstructive pulmonary disease, unspecified: Secondary | ICD-10-CM | POA: Diagnosis not present

## 2024-12-21 DIAGNOSIS — I5023 Acute on chronic systolic (congestive) heart failure: Secondary | ICD-10-CM

## 2024-12-21 DIAGNOSIS — I469 Cardiac arrest, cause unspecified: Secondary | ICD-10-CM | POA: Diagnosis not present

## 2024-12-21 DIAGNOSIS — I4891 Unspecified atrial fibrillation: Secondary | ICD-10-CM | POA: Diagnosis not present

## 2024-12-21 DIAGNOSIS — D72829 Elevated white blood cell count, unspecified: Secondary | ICD-10-CM | POA: Diagnosis not present

## 2024-12-21 DIAGNOSIS — E1151 Type 2 diabetes mellitus with diabetic peripheral angiopathy without gangrene: Secondary | ICD-10-CM

## 2024-12-21 DIAGNOSIS — I5043 Acute on chronic combined systolic (congestive) and diastolic (congestive) heart failure: Secondary | ICD-10-CM | POA: Diagnosis not present

## 2024-12-21 LAB — POCT I-STAT 7, (LYTES, BLD GAS, ICA,H+H)
Acid-base deficit: 1 mmol/L (ref 0.0–2.0)
Acid-base deficit: 2 mmol/L (ref 0.0–2.0)
Acid-base deficit: 2 mmol/L (ref 0.0–2.0)
Bicarbonate: 22.5 mmol/L (ref 20.0–28.0)
Bicarbonate: 22.9 mmol/L (ref 20.0–28.0)
Bicarbonate: 23.4 mmol/L (ref 20.0–28.0)
Calcium, Ion: 1.08 mmol/L — ABNORMAL LOW (ref 1.15–1.40)
Calcium, Ion: 1.09 mmol/L — ABNORMAL LOW (ref 1.15–1.40)
Calcium, Ion: 1.12 mmol/L — ABNORMAL LOW (ref 1.15–1.40)
HCT: 31 % — ABNORMAL LOW (ref 39.0–52.0)
HCT: 32 % — ABNORMAL LOW (ref 39.0–52.0)
HCT: 32 % — ABNORMAL LOW (ref 39.0–52.0)
Hemoglobin: 10.5 g/dL — ABNORMAL LOW (ref 13.0–17.0)
Hemoglobin: 10.9 g/dL — ABNORMAL LOW (ref 13.0–17.0)
Hemoglobin: 10.9 g/dL — ABNORMAL LOW (ref 13.0–17.0)
O2 Saturation: 98 %
O2 Saturation: 98 %
O2 Saturation: 99 %
Patient temperature: 35.2
Patient temperature: 35.6
Patient temperature: 36.2
Potassium: 3.7 mmol/L (ref 3.5–5.1)
Potassium: 4.1 mmol/L (ref 3.5–5.1)
Potassium: 4.6 mmol/L (ref 3.5–5.1)
Sodium: 126 mmol/L — ABNORMAL LOW (ref 135–145)
Sodium: 127 mmol/L — ABNORMAL LOW (ref 135–145)
Sodium: 128 mmol/L — ABNORMAL LOW (ref 135–145)
TCO2: 24 mmol/L (ref 22–32)
TCO2: 24 mmol/L (ref 22–32)
TCO2: 24 mmol/L (ref 22–32)
pCO2 arterial: 33.5 mmHg (ref 32–48)
pCO2 arterial: 35.5 mmHg (ref 32–48)
pCO2 arterial: 38.2 mmHg (ref 32–48)
pH, Arterial: 7.379 (ref 7.35–7.45)
pH, Arterial: 7.406 (ref 7.35–7.45)
pH, Arterial: 7.443 (ref 7.35–7.45)
pO2, Arterial: 125 mmHg — ABNORMAL HIGH (ref 83–108)
pO2, Arterial: 92 mmHg (ref 83–108)
pO2, Arterial: 95 mmHg (ref 83–108)

## 2024-12-21 LAB — RENAL FUNCTION PANEL
Albumin: 3.4 g/dL — ABNORMAL LOW (ref 3.5–5.0)
Albumin: 3.5 g/dL (ref 3.5–5.0)
Anion gap: 17 — ABNORMAL HIGH (ref 5–15)
Anion gap: 14 (ref 5–15)
BUN: 47 mg/dL — ABNORMAL HIGH (ref 6–20)
BUN: 39 mg/dL — ABNORMAL HIGH (ref 6–20)
CO2: 24 mmol/L (ref 22–32)
CO2: 25 mmol/L (ref 22–32)
Calcium: 8.9 mg/dL (ref 8.9–10.3)
Calcium: 8.6 mg/dL — ABNORMAL LOW (ref 8.9–10.3)
Chloride: 86 mmol/L — ABNORMAL LOW (ref 98–111)
Chloride: 90 mmol/L — ABNORMAL LOW (ref 98–111)
Creatinine, Ser: 2 mg/dL — ABNORMAL HIGH (ref 0.61–1.24)
Creatinine, Ser: 1.77 mg/dL — ABNORMAL HIGH (ref 0.61–1.24)
GFR, Estimated: 38 mL/min — ABNORMAL LOW
GFR, Estimated: 44 mL/min — ABNORMAL LOW
Glucose, Bld: 131 mg/dL — ABNORMAL HIGH (ref 70–99)
Glucose, Bld: 111 mg/dL — ABNORMAL HIGH (ref 70–99)
Phosphorus: 5.3 mg/dL — ABNORMAL HIGH (ref 2.5–4.6)
Phosphorus: 4.5 mg/dL (ref 2.5–4.6)
Potassium: 4.6 mmol/L (ref 3.5–5.1)
Potassium: 4.7 mmol/L (ref 3.5–5.1)
Sodium: 126 mmol/L — ABNORMAL LOW (ref 135–145)
Sodium: 129 mmol/L — ABNORMAL LOW (ref 135–145)

## 2024-12-21 LAB — COOXEMETRY PANEL
Carboxyhemoglobin: 1.1 % (ref 0.5–1.5)
Carboxyhemoglobin: 2 % — ABNORMAL HIGH (ref 0.5–1.5)
Methemoglobin: <0.7 % (ref 0.0–1.5)
Methemoglobin: <0.7 % (ref 0.0–1.5)
O2 Saturation: 49 %
O2 Saturation: 57.1 %
Total hemoglobin: 8.6 g/dL — ABNORMAL LOW (ref 12.0–16.0)
Total hemoglobin: 8.5 g/dL — ABNORMAL LOW (ref 12.0–16.0)

## 2024-12-21 LAB — CG4 I-STAT (LACTIC ACID)
Lactic Acid, Venous: 1.3 mmol/L (ref 0.5–1.9)
Lactic Acid, Venous: 1.5 mmol/L (ref 0.5–1.9)
Lactic Acid, Venous: 2.7 mmol/L (ref 0.5–1.9)

## 2024-12-21 LAB — BASIC METABOLIC PANEL WITH GFR
Anion gap: 16 — ABNORMAL HIGH (ref 5–15)
BUN: 47 mg/dL — ABNORMAL HIGH (ref 6–20)
CO2: 24 mmol/L (ref 22–32)
Calcium: 8.7 mg/dL — ABNORMAL LOW (ref 8.9–10.3)
Chloride: 86 mmol/L — ABNORMAL LOW (ref 98–111)
Creatinine, Ser: 1.96 mg/dL — ABNORMAL HIGH (ref 0.61–1.24)
GFR, Estimated: 39 mL/min — ABNORMAL LOW
Glucose, Bld: 120 mg/dL — ABNORMAL HIGH (ref 70–99)
Potassium: 4.1 mmol/L (ref 3.5–5.1)
Sodium: 126 mmol/L — ABNORMAL LOW (ref 135–145)

## 2024-12-21 LAB — GLUCOSE, CAPILLARY
Glucose-Capillary: 97 mg/dL (ref 70–99)
Glucose-Capillary: 133 mg/dL — ABNORMAL HIGH (ref 70–99)
Glucose-Capillary: 89 mg/dL (ref 70–99)
Glucose-Capillary: 119 mg/dL — ABNORMAL HIGH (ref 70–99)

## 2024-12-21 LAB — MAGNESIUM: Magnesium: 3.5 mg/dL — ABNORMAL HIGH (ref 1.7–2.4)

## 2024-12-21 LAB — CBC
HCT: 29.4 % — ABNORMAL LOW (ref 39.0–52.0)
Hemoglobin: 8.6 g/dL — ABNORMAL LOW (ref 13.0–17.0)
MCH: 22.6 pg — ABNORMAL LOW (ref 26.0–34.0)
MCHC: 29.3 g/dL — ABNORMAL LOW (ref 30.0–36.0)
MCV: 77.2 fL — ABNORMAL LOW (ref 80.0–100.0)
Platelets: 326 K/uL (ref 150–400)
RBC: 3.81 MIL/uL — ABNORMAL LOW (ref 4.22–5.81)
RDW: 24.8 % — ABNORMAL HIGH (ref 11.5–15.5)
WBC: 23.7 K/uL — ABNORMAL HIGH (ref 4.0–10.5)
nRBC: 0.4 % — ABNORMAL HIGH (ref 0.0–0.2)

## 2024-12-21 LAB — APTT: aPTT: 64 s — ABNORMAL HIGH (ref 24–36)

## 2024-12-21 LAB — LACTIC ACID, PLASMA: Lactic Acid, Venous: 6 mmol/L (ref 0.5–1.9)

## 2024-12-21 LAB — HEPARIN LEVEL (UNFRACTIONATED): Heparin Unfractionated: >1.1 [IU]/mL — ABNORMAL HIGH (ref 0.30–0.70)

## 2024-12-21 MED ORDER — HEPARIN SODIUM (PORCINE) 1000 UNIT/ML DIALYSIS
1000.0000 [IU] | INTRAMUSCULAR | Status: DC | PRN
  Filled 2024-12-21: qty 6

## 2024-12-21 MED ORDER — INSULIN GLARGINE 100 UNIT/ML ~~LOC~~ SOLN
20.0000 [IU] | Freq: Two times a day (BID) | SUBCUTANEOUS | Status: DC
  Administered 2024-12-21: 20 [IU] via SUBCUTANEOUS
  Filled 2024-12-21 (×2): qty 0.2

## 2024-12-21 MED ORDER — DEXTROSE 5 % IV SOLN
30.0000 mg/h | INTRAVENOUS | Status: DC
  Administered 2024-12-22: 30 mg/h via INTRAVENOUS
  Filled 2024-12-21: qty 450

## 2024-12-21 MED ORDER — ORAL CARE MOUTH RINSE: 15.0000 mL | OROMUCOSAL | Status: DC | PRN

## 2024-12-21 MED ORDER — ORAL CARE MOUTH RINSE
15.0000 mL | OROMUCOSAL | Status: DC
  Administered 2024-12-21 – 2024-12-23 (×23): 15 mL via OROMUCOSAL

## 2024-12-21 MED ORDER — HEPARIN (PORCINE) 25000 UT/250ML-% IV SOLN
1400.0000 [IU]/h | INTRAVENOUS | Status: DC
  Administered 2024-12-21: 1350 [IU]/h via INTRAVENOUS
  Administered 2024-12-22 – 2024-12-23 (×2): 1400 [IU]/h via INTRAVENOUS
  Filled 2024-12-21 (×3): qty 250

## 2024-12-21 MED ORDER — PRISMASOL BGK 4/2.5 32-4-2.5 MEQ/L EC SOLN: Status: DC

## 2024-12-21 MED ORDER — INSULIN ASPART 100 UNIT/ML IJ SOLN: 0.0000 [IU] | INTRAMUSCULAR | Status: DC

## 2024-12-21 MED ORDER — CALCIUM GLUCONATE-NACL 1-0.675 GM/50ML-% IV SOLN
1.0000 g | Freq: Once | INTRAVENOUS | Status: AC
  Administered 2024-12-21: 1000 mg via INTRAVENOUS
  Filled 2024-12-21: qty 50

## 2024-12-21 MED ORDER — PROPOFOL 1000 MG/100ML IV EMUL
0.0000 ug/kg/min | INTRAVENOUS | Status: DC
  Administered 2024-12-21: 30 ug/kg/min via INTRAVENOUS
  Administered 2024-12-21: 10 ug/kg/min via INTRAVENOUS
  Administered 2024-12-21: 20 ug/kg/min via INTRAVENOUS
  Administered 2024-12-21: 30 ug/kg/min via INTRAVENOUS
  Administered 2024-12-21: 20 ug/kg/min via INTRAVENOUS
  Administered 2024-12-22: 30 ug/kg/min via INTRAVENOUS
  Administered 2024-12-22: 25 ug/kg/min via INTRAVENOUS
  Administered 2024-12-22 – 2024-12-23 (×4): 30 ug/kg/min via INTRAVENOUS
  Filled 2024-12-21 (×9): qty 100
  Filled 2024-12-21: qty 200
  Filled 2024-12-21 (×3): qty 100

## 2024-12-21 MED ORDER — INSULIN GLARGINE 100 UNIT/ML ~~LOC~~ SOLN
20.0000 [IU] | Freq: Every day | SUBCUTANEOUS | Status: DC
  Filled 2024-12-21 (×2): qty 0.2

## 2024-12-21 MED ORDER — SODIUM CHLORIDE 0.9 % FOR CRRT: INTRAVENOUS_CENTRAL | Status: DC | PRN

## 2024-12-21 MED ORDER — MIDAZOLAM HCL (PF) 2 MG/2ML IJ SOLN
2.0000 mg | Freq: Once | INTRAMUSCULAR | Status: DC
  Filled 2024-12-21: qty 2

## 2024-12-21 NOTE — Progress Notes (Addendum)
 Initial Nutrition Assessment  DOCUMENTATION CODES:  Not applicable  INTERVENTION:  When clinical status allows, initiate tube feeding via OGT: Vital 1.5 at 55 ml/h (1320 ml per day) Prosource TF20 60 ml TID Provides 2220 kcal, 149 gm protein, 1008 ml free water  daily  NUTRITION DIAGNOSIS:  Increased nutrient needs related to acute illness (renal faliure requiring CRRT) as evidenced by estimated needs.  GOAL:  Patient will meet greater than or equal to 90% of their needs  MONITOR:  Vent status, Labs, I & O's  REASON FOR ASSESSMENT:  Consult Assessment of nutrition requirement/status (CRRT)  ASSESSMENT:  59 yo male admitted with acute on chronic CHF with SOB and increased edema. PMH includes HLD, PAD, GERD, HTN, CAD, DM, kidney stones, ischemic cardiomyopathy, PVD, CHF, former smoker, anal fissure, rectal polyp.  Patient coded 21-Dec-2024 and required intubation and transfer to ICU. Now with cardiogenic shock. Not ready to start TF today per discussion with MD.  Patient is currently intubated on ventilator support, currently requiring 1 pressor. MV: 13.6 L/min Temp (24hrs), Avg:95.9 F (35.5 C), Min:94.8 F (34.9 C), Max:98.7 F (37.1 C)  Propofol : 12.6 ml/hr providing 333 kcals daily.  CRRT initiated today. UOP 1,035 ml x last 24 h; 2,150 ml the previous 24 h  Admit weight: 102.1 kg (1/15) Current weight: 90.7 kg (1/18)  Average Meal Intake: 1/16-1/17: 100% intake x 5 recorded meals (Heart healthy diet with 1500 ml fluid restriction)  Nutritionally Relevant Medications: Scheduled Meds:  arformoterol   15 mcg Nebulization BID   And   umeclidinium bromide   1 puff Inhalation Daily   Chlorhexidine  Gluconate Cloth  6 each Topical Daily   clopidogrel   75 mg Oral Daily   ezetimibe   10 mg Oral Daily   insulin  aspart  0-15 Units Subcutaneous TID WC   insulin  aspart  3 Units Subcutaneous TID WC   insulin  glargine  20 Units Subcutaneous BID   midazolam  PF  2 mg Intravenous Once    mouth rinse  15 mL Mouth Rinse Q2H   pantoprazole  (PROTONIX ) IV  40 mg Intravenous QHS   sodium chloride  flush  3 mL Intravenous Q12H   Continuous Infusions:  amiodarone  30 mg/hr (12/21/24 1302)   epinephrine  10 mcg/min (12/21/24 1200)   fentaNYL  infusion INTRAVENOUS 75 mcg/hr (12/21/24 1200)   furosemide  Stopped (12/18/24 1727)   prismasol  BGK 4/2.5 400 mL/hr at 12/21/24 1012   prismasol  BGK 4/2.5 400 mL/hr at 12/21/24 1012   prismasol  BGK 4/2.5 1,500 mL/hr at 12/21/24 1012   propofol  (DIPRIVAN ) infusion 30 mcg/kg/min (12/21/24 1200)   PRN Meds:.acetaminophen  **OR** acetaminophen , albuterol , fentaNYL  (SUBLIMAZE ) injection, heparin , midazolam  PF, mineral oil-hydrophilic petrolatum , mouth rinse, oxyCODONE -acetaminophen  **AND** oxyCODONE , polyethylene glycol, sodium chloride , sodium chloride  flush  Labs Reviewed: Na 126 BUN 47 Creatinine 2 Phosphorus 4.3-->4.2-->5.3 CBG ranges from 76-133 mg/dL over the last 24 hours HgbA1c 12.3 (07/18/24)   NUTRITION - FOCUSED PHYSICAL EXAM: Unable to complete, RD working remotely  Diet Order:   Diet Order             Diet NPO time specified  Diet effective now                  EDUCATION NEEDS:  Not appropriate for education at this time  Skin:  Skin Assessment: Reviewed RN Assessment  Last BM:  1/14  Height:  Ht Readings from Last 1 Encounters:  Dec 21, 2024 5' 9 (1.753 m)   Weight:  Wt Readings from Last 1 Encounters:  12/21/24 90.7 kg  Ideal Body Weight:  72.7 kg  BMI:  Body mass index is 29.53 kg/m.  Estimated Nutritional Needs:  Kcal:  2100-2300 Protein:  135-180 gm Fluid:  2 L   Suzen HUNT RD, LDN, CNSC Contact via secure chat. If unavailable, use group chat RD Inpatient.

## 2024-12-21 NOTE — Procedures (Signed)
 Central Venous Catheter Insertion Procedure Note  Ryan [name]  [id number]  03/05/1966  Date:12/21/24  Time:7:44 AM   Provider Performing:Adamarie Izzo R Milee Qualls   Procedure: Insertion of Non-tunneled Central Venous Catheter(36556)with US  guidance (23062)    Indication(s) Hemodialysis  Consent Risks of the procedure as well as the alternatives and risks of each were explained to the patient and/or caregiver.  Consent for the procedure was obtained and is signed in the bedside chart  Anesthesia Topical only with 1% lidocaine    Timeout Verified patient identification, verified procedure, site/side was marked, verified correct patient position, special equipment/implants available, medications/allergies/relevant history reviewed, required imaging and test results available.  Sterile Technique Maximal sterile technique including full sterile barrier drape, hand hygiene, sterile gown, sterile gloves, mask, hair covering, sterile ultrasound probe cover (if used).  Procedure Description Area of catheter insertion was cleaned with chlorhexidine  and draped in sterile fashion.   With real-time ultrasound guidance a HD catheter was placed into the right internal jugular vein.  Nonpulsatile blood flow and easy flushing noted in all ports.  The catheter was sutured in place and sterile dressing applied.  Complications/Tolerance None; patient tolerated the procedure well. Chest X-ray is ordered to verify placement for internal jugular or subclavian cannulation.  Chest x-ray is not ordered for femoral cannulation.  EBL Minimal  Specimen(s) None   Ryan [name] [place], PA-C

## 2024-12-21 NOTE — Progress Notes (Signed)
 VENTILATOR WEAN NOTE 12/21/2024  Start Mode: PRVC  Wean Mode: Pressure Support  Duration before failure: n/a  Reason for failure: Other:  Inadequate cough with suctioning.  Notes: RT did not attempt vent wean due to inadequate cough with suctioning and FIO2 > 40%.

## 2024-12-21 NOTE — Progress Notes (Signed)
 "    Advanced Heart Failure Rounding Note  Cardiologist: [name], MD  [facility] Cardiologist: Dr. Zenaida Chief Complaint: a/c systolic heart failure  Patient Profile   Ryan [name] is a 59 y.o. male with a PMH of CAD s/p multiple PCIs and CABG, chronic systolic heart failure d/t ischemic CM, AFL s/p AFL ablation, PAD, HLD, LV apical thrombus, carotid artery disease and tobacco use, admitted w/ a/c CHF in the setting of dietary indiscretion. Failed attempts at outpatient diuresis.   Significant events:   1/15: placed lasix  gtt @20 /hr + Diamox  1/17: PEA arrest with CPR. Moved to ICU 1/18: CRRT started  Subjective:    Had PEA arrest yesterday with 19 mins CPR (fell in bathroom and hit head - CT negative for bleed)  Now intubated/sedated on CRRT.   On epi 10   Lactic 6.0 -> 2.7    Objective:   Weight Range: 90.7 kg Body mass index is 29.53 kg/m.   Vital Signs:   Temp:  [94.8 F (34.9 C)-98.7 F (37.1 C)] 97.8 F (36.6 C) (01/18 1138) Pulse Rate:  [59-139] 70 (01/18 1219) Resp:  [13-26] 24 (01/18 1219) BP: (85-151)/(51-89) 106/57 (01/18 1219) SpO2:  [60 %-100 %] 100 % (01/18 1219) Arterial Line BP: (73-117)/(42-62) 105/57 (01/18 1219) FiO2 (%):  [40 %-100 %] 40 % (01/18 1219) Weight:  [90.7 kg] 90.7 kg (01/18 0500) Last BM Date : 12/17/24  Weight change: Filed Weights   12/19/24 0300 12/20/24 0440 12/21/24 0500  Weight: 102.5 kg 105 kg 90.7 kg    Intake/Output:   Intake/Output Summary (Last 24 hours) at 12/21/2024 1308 Last data filed at 12/21/2024 1200 Gross per 24 hour  Intake 2084.25 ml  Output 834 ml  Net 1250.25 ml     Physical Exam   General:  Ill appearing. Intubated/sedated  HEENT: normal + ETT Neck: supple. + internal jugular HD cath Cor: Regular rate & rhythm. No rubs, gallops or murmurs. Lungs: clear Abdomen: soft, nontender, nondistended.Good bowel sounds. Extremities: no cyanosis, clubbing, rash, edema Neuro: sedated on vent    Telemetry   NSR 80s w/ occasional PVCs, 4 beat run of NSVT, personally reviewed   Labs   CBC Recent Labs    12/20/24 2015 12/21/24 0001 12/21/24 0434 12/21/24 0657  WBC 22.6*  --  23.7*  --   HGB 8.0*   < > 8.6* 10.9*  HCT 27.8*   < > 29.4* 32.0*  MCV 78.1*  --  77.2*  --   PLT 274  --  326  --    < > = values in this interval not displayed.   Basic Metabolic Panel Recent Labs    98/82/73 0229 12/20/24 0230 12/20/24 1844 12/21/24 0142 12/21/24 0348 12/21/24 0434 12/21/24 0657  NA 127* 127*   < > 126*   < > 126* 126*  K 3.7 3.7   < > 4.1   < > 4.6 4.6  CL 86* 86*   < > 86*  --  86*  --   CO2 30 30   < > 24  --  24  --   GLUCOSE 127* 126*   < > 120*  --  131*  --   BUN 43* 44*   < > 47*  --  47*  --   CREATININE 1.63* 1.61*   < > 1.96*  --  2.00*  --   CALCIUM  8.4* 8.5*   < > 8.7*  --  8.9  --  MG 2.6*  --   --   --   --  3.5*  --   PHOS  --  4.2  --   --   --  5.3*  --    < > = values in this interval not displayed.   Liver Function Tests Recent Labs    12/20/24 1844 12/20/24 2015 12/21/24 0434  AST 29 28  --   ALT 10 14  --   ALKPHOS 220* 278*  --   BILITOT 1.0 1.5*  --   PROT 5.5* 6.9  --   ALBUMIN  2.6* 3.3* 3.4*   No results for input(s): LIPASE, AMYLASE in the last 72 hours. Cardiac Enzymes No results for input(s): CKTOTAL, CKMB, CKMBINDEX, TROPONINI in the last 72 hours.  BNP: BNP (last 3 results) Recent Labs    07/29/24 0856 11/13/24 1242 11/17/24 1436  BNP 1,679.4* 1,211.2* 1,251.0*    ProBNP (last 3 results) Recent Labs    12/18/24 1237  PROBNP 3,670.0*     D-Dimer No results for input(s): DDIMER in the last 72 hours. Hemoglobin A1C No results for input(s): HGBA1C in the last 72 hours. Fasting Lipid Panel No results for input(s): CHOL, HDL, LDLCALC, TRIG, CHOLHDL, LDLDIRECT in the last 72 hours. Medications:   Scheduled Medications:  arformoterol   15 mcg Nebulization BID   And   umeclidinium  bromide  1 puff Inhalation Daily   Chlorhexidine  Gluconate Cloth  6 each Topical Daily   clopidogrel   75 mg Oral Daily   ezetimibe   10 mg Oral Daily   insulin  aspart  0-15 Units Subcutaneous TID WC   insulin  aspart  3 Units Subcutaneous TID WC   insulin  glargine  20 Units Subcutaneous BID   midazolam  PF  2 mg Intravenous Once   mouth rinse  15 mL Mouth Rinse Q2H   pantoprazole  (PROTONIX ) IV  40 mg Intravenous QHS   sodium chloride  flush  3 mL Intravenous Q12H    Infusions:  amiodarone  30 mg/hr (12/21/24 1302)   epinephrine  10 mcg/min (12/21/24 1200)   fentaNYL  infusion INTRAVENOUS 75 mcg/hr (12/21/24 1200)   furosemide  Stopped (12/18/24 1727)   prismasol  BGK 4/2.5 400 mL/hr at 12/21/24 1012   prismasol  BGK 4/2.5 400 mL/hr at 12/21/24 1012   prismasol  BGK 4/2.5 1,500 mL/hr at 12/21/24 1012   propofol  (DIPRIVAN ) infusion 30 mcg/kg/min (12/21/24 1200)    PRN Medications: acetaminophen  **OR** acetaminophen , albuterol , fentaNYL  (SUBLIMAZE ) injection, heparin , midazolam  PF, mineral oil-hydrophilic petrolatum , mouth rinse, oxyCODONE -acetaminophen  **AND** oxyCODONE , polyethylene glycol, sodium chloride , sodium chloride  flush  Assessment/Plan   PEA arrest - on 1/17 - now intubated/sedated  - on Epi  - With progressive RV dysfunction - concern for potential PE but was on Eliquis  chronically prior to even so less likely   Acute on chronic Systolic Heart failure with biventricular failure and cardiogenic shock: Ischemic related with recent worsening in the setting of atrial flutter.  - Echo 8/25: EF 45%, Akinesis of distal lateral and apical walls. LV with GHK, GIIDD, RV low normal, LA mildly dilated, trivial MR - S/p flutter ablation, though having rare episodes of fib.   - NYHA IV. Has failed outpatient diuretic titration. Admitted with marked volume overload - Echo 1/17 EF 35-40% RV severely reduced  - Now on epi for BP support - Last co-ox 49% Will repeat - Volume management with  CRRT - Not candidate for mechanical support or advanced therapies  AFL/AFib: Recurrent, s/p atrial flutter ablation with Dr. Kennyth 6/25. Loop recorder implanted  to evaluate for atrial fibrillation. - s/p AF ablation 12/25. - Now off amiodarone  - in NSR  - Eliquis  has been stopped post code. Would start heparin    AKI - due to ATN from shock/cardiac arrest - now on CRRT  CAD: - Continue Plavix  & apixaban  - Continue atorvastatin , repatha , LDL at goal. - No s/s angina   PAD: Extensive history, carotid as well as femoral disease.  - Not a candidate for advanced therapies - Continue eliquis , plavix , aggressive risk factor modification - Smoking cessation encouraged   DM: - Uncontrolled.  - Last A1c 12.5 8/25 - He is on insulin  - Off SGLT2i for now  Hypokalemia: - supp as needed  CRITICAL CARE Performed by: Nuvia Hileman  Total critical care time: 45 minutes  Critical care time was exclusive of separately billable procedures and treating other patients.  Critical care was necessary to treat or prevent imminent or life-threatening deterioration.  Critical care was time spent personally by me (independent of midlevel providers or residents) on the following activities: development of treatment plan with patient and/or surrogate as well as nursing, discussions with consultants, evaluation of patient's response to treatment, examination of patient, obtaining history from patient or surrogate, ordering and performing treatments and interventions, ordering and review of laboratory studies, ordering and review of radiographic studies, pulse oximetry and re-evaluation of patient's condition.   Length of Stay: 3  Toribio Fuel, MD  12/21/2024, 1:08 PM  Advanced Heart Failure Team Pager [phone] (M-F; 7a - 5p)   Please visit [email or web address]: For overnight coverage please call cardiology fellow first. If fellow not available call Shock/ECMO MD on call.  For ECMO / Mechanical  Support (Impella, IABP, LVAD) issues call Shock / ECMO MD on call.    "

## 2024-12-21 NOTE — Consult Note (Signed)
 Ryan [name] Admit Date: 12/18/2024 12/21/2024 Ryan [name] Requesting Physician:  Stretch MD  Reason for Consult:  Anuric AKI after cardiac arrest HPI:  31M PMH including biventricular HFrEF, A-fib, hypertension, hyperlipidemia, CAD with a history of CABG, PVD with history of femoropopliteal bypass admitted 1/15 for acute on chronic HFrEF exacerbation and suffered bradycardic/PEA cardiac arrest yesterday.  Now in 2H, VDRF, on epi gtt, and with diminishing UOP despite aggressive Lasix  bolus/drip.    This morning creatinine up to 2.0.  K4.6, BUN 47, sodium 126.  Foley catheter present.  UOP only 0.135 L in the past 12 hours.  Family at bedside.  Updated on current status.  He states that patient had expressed wishes to have trial of aggressive life support but for this not to be prolonged if not improving.   Creat (mg/dL)  Date Value  88/91/7982 0.98   Creatinine, Ser (mg/dL)  Date Value  98/81/7973 2.00 (H)  12/21/2024 1.96 (H)  12/20/2024 1.82 (H)  12/20/2024 1.53 (H)  12/20/2024 1.61 (H)  12/20/2024 1.63 (H)  12/19/2024 1.61 (H)  12/19/2024 1.50 (H)  12/19/2024 1.51 (H)  12/18/2024 1.51 (H)  ] I/Os: I/O last 3 completed shifts: In: 2528.8 [P.O.:600; I.V.:1164.7; IV Piggyback:764.1] Out: 1985 [Urine:1985]   ROS  Balance of 12 systems is negative w/ exceptions as above  PMH  Past Medical History:  Diagnosis Date   Acute myocardial infarction of other anterior wall, subsequent episode of care 12/12/2011   Anal fissure    Arthritis    back   Atherosclerosis of native artery of both lower extremities with intermittent claudication 04/01/2018   Bruises easily    d/t being on Effient    CAD (coronary artery disease)    A. Acute Ant STEMI 12/12/2011    Cardiomyopathy secondary 12/26/2011   Chronic systolic CHF (congestive heart failure) (HCC)    ischemic CM // Echo 6/21: EF 30-35  //  Echocardiogram 10/21: LV apical thrombus resolved, EF 50-55, ant and apical, ant-lat  HK, trivial MR, trivial AI   Chronic total occlusion of artery of the extremities 02/26/2012   Claudication 12/26/2011   Coronary atherosclerosis of native coronary artery    a. ant STEMI with cardiac arrest 2013 s/p DES to mLAD. b. [place]  10/2016 s/p PTCA to mLAD.   Diabetes mellitus without complication (HCC)    Essential hypertension 10/23/2016   GERD (gastroesophageal reflux disease)    takes tums   History of blood transfusion    no abnormal reaction noted   History of kidney stones    Hypertension    Ischemic cardiomyopathy    a. EF 35% in 02/2012 at time of acute MI, improved to normal on subsequent imaging.   MI (myocardial infarction) (HCC)    AMI 1/13 - complicated by VT/Tosades   Mixed hyperlipidemia    NSTEMI (non-ST elevation myocardial infarction) (HCC) 03/26/2023   Numbness and tingling of right arm 03/31/2013   Occlusion and stenosis of carotid artery without mention of cerebral infarction 08/26/2012   PAD (peripheral artery disease)    followed by Vascular - aortobifem bypass 01/2013 & left carotid-subclavian artery bypass 04/2013   Paroxysmal atrial flutter (HCC) 07/20/2024   Peripheral vascular disease, unspecified 12/16/2012   PVD (peripheral vascular disease) 03/31/2013   Rectal polyp    ST elevation myocardial infarction involving left anterior descending (LAD) coronary artery (HCC) 07/31/2017   Subclavian steal syndrome 08/11/2013   Syncope 07/20/2024   Uncontrolled diabetes mellitus 10/23/2016   Unstable angina (HCC)  Wears partial dentures    upper   PSH  Past Surgical History:  Procedure Laterality Date   A-FLUTTER ABLATION N/A 05/05/2024   Procedure: A-FLUTTER ABLATION;  Surgeon: [name], MD;  Location: [facility] INVASIVE CV LAB;  Service: Cardiovascular;  Laterality: N/A;   ABDOMINAL AORTOGRAM W/LOWER EXTREMITY N/A 06/26/2017   Procedure: Abdominal Aortogram w/Lower Extremity;  Surgeon: Serene Gaile ORN, MD;  Location: [facility] INVASIVE CV LAB;  Service:  Cardiovascular;  Laterality: N/A;   ABDOMINAL AORTOGRAM W/LOWER EXTREMITY N/A 06/28/2021   Procedure: ABDOMINAL AORTOGRAM W/LOWER EXTREMITY;  Surgeon: Serene Gaile ORN, MD;  Location: [facility] INVASIVE CV LAB;  Service: Cardiovascular;  Laterality: N/A;   ABDOMINAL AORTOGRAM W/LOWER EXTREMITY N/A 01/02/2023   Procedure: ABDOMINAL AORTOGRAM W/LOWER EXTREMITY;  Surgeon: Serene Gaile ORN, MD;  Location: [facility] INVASIVE CV LAB;  Service: Cardiovascular;  Laterality: N/A;   ANAL FISSURECTOMY     AORTA - BILATERAL FEMORAL ARTERY BYPASS GRAFT N/A 01/16/2013   Procedure: AORTA BIFEMORAL BYPASS GRAFT;  Surgeon: Gaile ORN Serene, MD;  Location: [facility] OR;  Service: Vascular;  Laterality: N/A;   APPENDECTOMY     ATRIAL FIBRILLATION ABLATION N/A 11/12/2024   Procedure: ATRIAL FIBRILLATION ABLATION;  Surgeon: [name], MD;  Location: [facility] INVASIVE CV LAB;  Service: Cardiovascular;  Laterality: N/A;   CARDIAC CATHETERIZATION N/A 10/23/2016   Procedure: Left Heart Cath and Coronary Angiography;  Surgeon: Lonni JONETTA Cash, MD;  Location: [facility] INVASIVE CV LAB;  Service: Cardiovascular;  Laterality: N/A;   CARDIAC CATHETERIZATION N/A 10/23/2016   Procedure: Coronary Balloon Angioplasty;  Surgeon: Lonni JONETTA Cash, MD;  Location: [facility] INVASIVE CV LAB;  Service: Cardiovascular;  Laterality: N/A;   CARDIOVERSION N/A 03/31/2024   Procedure: CARDIOVERSION;  Surgeon: Zenaida Morene PARAS, MD;  Location: [facility] INVASIVE CV LAB;  Service: Cardiovascular;  Laterality: N/A;   CAROTID-SUBCLAVIAN BYPASS GRAFT Left 04/03/2013   Procedure: BYPASS GRAFT CAROTID-SUBCLAVIAN;  Surgeon: Gaile ORN Serene, MD;  Location: [facility] OR;  Service: Vascular;  Laterality: Left;   CIRCUMCISION N/A 12/15/2015   Procedure: CIRCUMCISION ADULT;  Surgeon: Belvie LITTIE Clara, MD;  Location: [facility] ORS;  Service: Urology;  Laterality: N/A;   COLONOSCOPY     CORONARY ANGIOPLASTY     stent placed Dec 12, 2011 and 2018   CORONARY ARTERY BYPASS GRAFT N/A 05/05/2020    Procedure: CORONARY ARTERY BYPASS GRAFTING (CABG) x Three, Using Left internal Mammary Artery and Left Leg greater saphenous vein harvested endoscopically;  Surgeon: Army Dallas NOVAK, MD;  Location: [facility] OR;  Service: Open Heart Surgery;  Laterality: N/A;   CORONARY BALLOON ANGIOPLASTY N/A 05/23/2019   Procedure: CORONARY BALLOON ANGIOPLASTY;  Surgeon: Cash Lonni JONETTA, MD;  Location: [facility] INVASIVE CV LAB;  Service: Cardiovascular;  Laterality: N/A;   CORONARY PRESSURE/FFR STUDY N/A 04/22/2020   Procedure: INTRAVASCULAR PRESSURE WIRE/FFR STUDY;  Surgeon: Cash Lonni JONETTA, MD;  Location: [facility] INVASIVE CV LAB;  Service: Cardiovascular;  Laterality: N/A;   DIAGNOSTIC LAPAROSCOPY     ENDARTERECTOMY FEMORAL Right 03/13/2018   Procedure: FEMORAL ENDARTERECTOMY RIGHT;  Surgeon: Serene Gaile ORN, MD;  Location: [facility] OR;  Service: Vascular;  Laterality: Right;   ENDARTERECTOMY FEMORAL Left 01/26/2023   Procedure: REDO LEFT COMMON FEMORAL ENDARTERECTOMY WITH VPA;  Surgeon: Serene Gaile ORN, MD;  Location: [facility] OR;  Service: Vascular;  Laterality: Left;   FEMORAL-POPLITEAL BYPASS GRAFT Right 03/13/2018   Procedure: FEMORAL-POPLITEAL ARTERY BYPASS WITH NON-REVERSE VEIN RIGHT;  Surgeon: Serene Gaile ORN, MD;  Location: [facility] OR;  Service: Vascular;  Laterality: Right;  GROIN DISSECTION Right 03/13/2018   Procedure: RE-DO COMMON FEMORAL ARTERY EXPOSURE;  Surgeon: Serene Gaile ORN, MD;  Location: [facility] OR;  Service: Vascular;  Laterality: Right;   I & D EXTREMITY Left 04/18/2013   Procedure: debridement of left neck lymphocele;  Surgeon: Gaile ORN Serene, MD;  Location: [facility] OR;  Service: Vascular;  Laterality: Left;  I and D of left neck   I & D EXTREMITY Right 11/21/2014   Procedure: IRRIGATION AND DEBRIDEMENT RIGHT HAND;  Surgeon: Elsie Mussel, MD;  Location: [facility] OR;  Service: Orthopedics;  Laterality: Right;   LEFT HEART CATH AND CORONARY ANGIOGRAPHY N/A 05/23/2019   Procedure: LEFT HEART CATH AND CORONARY  ANGIOGRAPHY;  Surgeon: Verlin Lonni BIRCH, MD;  Location: [facility] INVASIVE CV LAB;  Service: Cardiovascular;  Laterality: N/A;   LEFT HEART CATH AND CORONARY ANGIOGRAPHY N/A 04/22/2020   Procedure: LEFT HEART CATH AND CORONARY ANGIOGRAPHY;  Surgeon: Verlin Lonni BIRCH, MD;  Location: [facility] INVASIVE CV LAB;  Service: Cardiovascular;  Laterality: N/A;   LEFT HEART CATHETERIZATION WITH CORONARY ANGIOGRAM N/A 12/12/2011   Procedure: LEFT HEART CATHETERIZATION WITH CORONARY ANGIOGRAM;  Surgeon: Lonni BIRCH Verlin, MD;  Location: [facility] CATH LAB;  Service: Cardiovascular;  Laterality: N/A;   LOOP RECORDER INSERTION N/A 05/05/2024   Procedure: LOOP RECORDER INSERTION;  Surgeon: [name], MD;  Location: [facility] INVASIVE CV LAB;  Service: Cardiovascular;  Laterality: N/A;   LOWER EXTREMITY ANGIOGRAM N/A 01/31/2012   Procedure: LOWER EXTREMITY ANGIOGRAM;  Surgeon: Lonni BIRCH Verlin, MD;  Location: [facility] CATH LAB;  Service: Cardiovascular;  Laterality: N/A;   LOWER EXTREMITY ANGIOGRAPHY N/A 04/23/2018   Procedure: LOWER EXTREMITY ANGIOGRAPHY;  Surgeon: Serene Gaile ORN, MD;  [facility]: [facility] INVASIVE CV LAB;  Service: Cardiovascular;  Laterality: N/A;   PATCH ANGIOPLASTY Left 01/26/2023   Procedure: PATCH ANGIOPLASTY USING HEMASHIELD PLATINUM FINESSE;  Surgeon: Serene Gaile ORN, MD;  Location: [facility] OR;  Service: Vascular;  Laterality: Left;   PERCUTANEOUS CORONARY STENT INTERVENTION (PCI-S) Right 12/12/2011   Procedure: PERCUTANEOUS CORONARY STENT INTERVENTION (PCI-S);  Surgeon: Lonni BIRCH Verlin, MD;  Location: [facility] CATH LAB;  Service: Cardiovascular;  Laterality: Right;   PERIPHERAL VASCULAR BALLOON ANGIOPLASTY Right 06/28/2021   Procedure: PERIPHERAL VASCULAR BALLOON ANGIOPLASTY;  Surgeon: Serene Gaile ORN, MD;  Location: [facility] INVASIVE CV LAB;  Service: Cardiovascular;  Laterality: Right;  common femoral (graft)   PERIPHERAL VASCULAR BALLOON ANGIOPLASTY  01/02/2023   Procedure: PERIPHERAL VASCULAR BALLOON ANGIOPLASTY;   Surgeon: Serene Gaile ORN, MD;  Location: [facility] INVASIVE CV LAB;  Service: Cardiovascular;;   PERIPHERAL VASCULAR INTERVENTION Left 04/23/2018   Procedure: PERIPHERAL VASCULAR INTERVENTION;  Surgeon: Serene Gaile ORN, MD;  Location: [facility] INVASIVE CV LAB;  Service: Cardiovascular;  Laterality: Left;  superficial femoral   REPAIR EXTENSOR TENDON Right 11/21/2014   Procedure: WITH REPAIR/RECONSTRUCTION OF EXTENSOR TENDONS AS NEEDED;  Surgeon: Elsie Mussel, MD;  Location: [facility] OR;  Service: Orthopedics;  Laterality: Right;   TEE WITHOUT CARDIOVERSION N/A 05/05/2020   Procedure: TRANSESOPHAGEAL ECHOCARDIOGRAM (TEE);  Surgeon: Army Dallas NOVAK, MD;  Location: [facility] OR;  Service: Open Heart Surgery;  Laterality: N/A;   FH  Family History  Problem Relation Age of Onset   Cancer Father        Lung   Diabetes Mother    COPD Mother    Cancer Maternal Uncle        Colon   SH  reports that he has quit smoking. His smoking use included cigarettes. He has a 6.3 pack-year smoking history. He  has never used smokeless tobacco. He reports current alcohol  use of about 2.0 standard drinks of alcohol  per week. He reports that he does not use drugs. Allergies Allergies[1] Home medications Prior to Admission medications  Medication Sig Start Date End Date Taking? Authorizing Provider  albuterol  (VENTOLIN  HFA) 108 (90 Base) MCG/ACT inhaler Inhale 1-2 puffs into the lungs every 6 (six) hours as needed for wheezing or shortness of breath.   Yes [provider]  cetirizine (ZYRTEC) 10 MG tablet Take 10 mg by mouth daily as needed for allergies. 06/02/21  Yes [provider]  clopidogrel  (PLAVIX ) 75 MG tablet TAKE 1 TABLET BY MOUTH EVERY DAY 09/01/24  Yes Verlin Lonni BIRCH, MD  diazepam  (VALIUM ) 10 MG tablet Take 10 mg by mouth at bedtime. 03/10/21  Yes [provider]  ELIQUIS  5 MG TABS tablet TAKE 1 TABLET BY MOUTH 2 TIMES A DAY 09/29/24  Yes [name], MD  ezetimibe  (ZETIA ) 10 MG tablet  TAKE 1 TABLET BY MOUTH EACH DAY 03/07/24  Yes Weaver, Scott T, PA-C  Furosemide  (FUROSCIX ) 80 MG/10ML CTKT Inject 80 mg into the skin once daily as directed. 12/15/24  Yes Milford, [name], FNP  gabapentin  (NEURONTIN ) 300 MG capsule Take 300 mg by mouth 3 (three) times daily. 03/25/20  Yes [provider]  icosapent  Ethyl (VASCEPA ) 1 g capsule TAKE 2 CAPSULES BY MOUTH TWICE DAILY 05/30/24  Yes Lelon Hamilton T, PA-C  insulin  glargine (LANTUS ) 100 UNIT/ML Solostar Pen Inject 50 Units into the skin 2 (two) times daily.   Yes [provider]  insulin  lispro (HUMALOG ) 100 UNIT/ML injection Inject 0-15 Units into the skin 3 (three) times daily as needed for high blood sugar.   Yes [provider]  methocarbamol  (ROBAXIN ) 500 MG tablet Take 500 mg by mouth every 6 (six) hours as needed for muscle spasms. 11/05/24  Yes [provider]  nitroGLYCERIN  (NITROSTAT ) 0.4 MG SL tablet Place 0.4 mg under the tongue every 5 (five) minutes as needed for chest pain.   Yes [provider]  oxyCODONE -acetaminophen  (PERCOCET) 10-325 MG tablet Take 1 tablet by mouth 4 (four) times daily.   Yes [provider]  pantoprazole  (PROTONIX ) 40 MG tablet Take 40 mg by mouth daily. 03/07/24  Yes [provider]  potassium chloride  SA (KLOR-CON  M) 20 MEQ tablet Take 3 tablets (60 mEq total) by mouth 2 (two) times daily. Take an extra 2 tablets (40 meq total) when you take Metolazone . 12/01/24  Yes Milford, [name], FNP  pseudoephedrine (SUDAFED) 30 MG tablet Take 30 mg by mouth every 4 (four) hours as needed for congestion.   Yes [provider]  ramelteon  (ROZEREM ) 8 MG tablet TAKE 1 TABLET (8 MG TOTAL) BY MOUTH AT BEDTIME. 12/01/24  Yes Olalere, Adewale A, MD  REPATHA  SURECLICK 140 MG/ML SOAJ INJECT 140 MG INTO THE SKIN EVERY 14 DAYS AS DIRECTED 05/30/24  Yes [name], MD  Tiotropium Bromide-Olodaterol (STIOLTO RESPIMAT ) 2.5-2.5 MCG/ACT AERS INHALE 2 PUFFS INTO  THE LUNGS DAILY. 12/01/24  Yes Olalere, Adewale A, MD  torsemide  (DEMADEX ) 20 MG tablet Take 4 tablets (80 mg total) by mouth 2 (two) times daily. 11/28/24  Yes Clegg, [name], NP  traZODone  (DESYREL ) 100 MG tablet Take 100 mg by mouth at bedtime.   Yes [provider]  TRINTELLIX  20 MG TABS tablet Take 20 mg by mouth daily in the afternoon. 12/29/22  Yes [provider]  ACCU-CHEK GUIDE TEST test strip  12/01/24  [provider]  ADVOCATE INSULIN  SYRINGE 31G X 5/16 1 ML MISC  12/01/24   [provider]  GNP PEN NEEDLES 32G X 4 MM MISC  12/01/24   [provider]  metolazone  (ZAROXOLYN ) 5 MG tablet Take 1 tablet (5 mg total) by mouth daily. Patient not taking: Reported on 12/18/2024 12/15/24   Glena [name], FNP  OXYGEN Inhale 2 L into the lungs as needed (excessive movement).    [provider]    Current Medications Scheduled Meds:  arformoterol   15 mcg Nebulization BID   And   umeclidinium bromide   1 puff Inhalation Daily   Chlorhexidine  Gluconate Cloth  6 each Topical Daily   clopidogrel   75 mg Oral Daily   ezetimibe   10 mg Oral Daily   insulin  aspart  0-15 Units Subcutaneous TID WC   insulin  aspart  3 Units Subcutaneous TID WC   insulin  glargine  20 Units Subcutaneous BID   midazolam  PF  2 mg Intravenous Once   mouth rinse  15 mL Mouth Rinse Q2H   pantoprazole  (PROTONIX ) IV  40 mg Intravenous QHS   sodium chloride  flush  3 mL Intravenous Q12H   Continuous Infusions:  amiodarone  30 mg/hr (12/21/24 1000)   epinephrine  9 mcg/min (12/21/24 1000)   fentaNYL  infusion INTRAVENOUS 50 mcg/hr (12/21/24 1000)   furosemide  Stopped (12/18/24 1727)   prismasol  BGK 4/2.5 400 mL/hr at 12/21/24 1012   prismasol  BGK 4/2.5 400 mL/hr at 12/21/24 1012   prismasol  BGK 4/2.5 1,500 mL/hr at 12/21/24 1012   propofol  (DIPRIVAN ) infusion 20 mcg/kg/min (12/21/24 1000)   PRN Meds:.acetaminophen  **OR** acetaminophen , albuterol , fentaNYL  (SUBLIMAZE )  injection, heparin , midazolam  PF, mineral oil-hydrophilic petrolatum , mouth rinse, oxyCODONE -acetaminophen  **AND** oxyCODONE , polyethylene glycol, sodium chloride , sodium chloride  flush  CBC Recent Labs  Lab 12/20/24 1844 12/20/24 1940 12/20/24 2015 12/21/24 0001 12/21/24 0348 12/21/24 0434 12/21/24 0657  WBC 13.6*  --  22.6*  --   --  23.7*  --   HGB 6.5*   < > 8.0*   < > 10.9* 8.6* 10.9*  HCT 26.9*   < > 27.8*   < > 32.0* 29.4* 32.0*  MCV 92.8  --  78.1*  --   --  77.2*  --   PLT 224  --  274  --   --  326  --    < > = values in this interval not displayed.   Basic Metabolic Panel Recent Labs  Lab 12/19/24 0301 12/19/24 1434 12/20/24 0229 12/20/24 0230 12/20/24 1844 12/20/24 1940 12/20/24 2015 12/21/24 0001 12/21/24 0142 12/21/24 0348 12/21/24 0434 12/21/24 0657  NA 132* 127* 127* 127* 108* 128* 129* 128* 126* 127* 126* 126*  K 2.9* 3.7 3.7 3.7 2.7* 3.2* 3.4* 3.7 4.1 4.1 4.6 4.6  CL 87* 87* 86* 86* <65*  --  88*  --  86*  --  86*  --   CO2 33* 30 30 30  15*  --  26  --  24  --  24  --   GLUCOSE 223* 177* 127* 126* 1,117*  --  77  --  120*  --  131*  --   BUN 48* 44* 43* 44* 33*  --  43*  --  47*  --  47*  --   CREATININE 1.50* 1.61* 1.63* 1.61* 1.53*  --  1.82*  --  1.96*  --  2.00*  --   CALCIUM  8.5* 8.4* 8.4* 8.5* 6.9*  --  8.7*  --  8.7*  --  8.9  --   PHOS 4.3  --   --  4.2  --   --   --   --   --   --  5.3*  --     Physical Exam  Blood pressure (!) 94/51, pulse 64, temperature (!) 97.2 F (36.2 C), resp. rate 19, height 5' 9 (1.753 m), weight 90.7 kg, SpO2 98%. GEN: Intubated, sedated ENT: ET tube in place CV: Regular, no murmur or rub PULM: Coarse breath sounds bilaterally ABD: Soft throughout SKIN: No rashes or lesions GU: Foley in place EXT: Trace edema  Assessment 58M with progressive anuric AKI following cardiac arrest 1/17 after presenting with acute on chronic biventricular HFrEF exacerbation.  Anuric AKI with volume overload and failing  diuretics.  Foley catheter present. Cardiac arrest PEA/bradycardic 1/17 with 60 minutes before ROSC concern for anoxic injury Acute on chronic biventricular HFrEF exacerbation with shock on epinephrine  VDRF following #2 Hyponatremia, sodium 126 this morning CAD with history of CABG; PVD with history of femoral bypass DM2 Anemia, most recent hemoglobin 10.9  Plan Initiate CRRT, to support while we prognosticate neurological recovery.  All 4K bath.  No heparin .  50 to 100 mL/h net negative. Family very clear the patient wanted a limited trial of life-sustaining care and if not improving to transition to comfort measures. Sodium will slowly improve with CRRT.   Ryan [name]  12/21/2024, 10:38 AM        [1]  Allergies Allergen Reactions   Metformin Anaphylaxis    Water  blisters between fingers   Hydrocodone  Hives and Nausea And Vomiting

## 2024-12-21 NOTE — Progress Notes (Signed)
 PHARMACY - ANTICOAGULATION CONSULT NOTE  Pharmacy Consult for heparin  Indication: atrial fibrillation  Allergies[1]  Patient Measurements: Height: 5' 9 (175.3 cm) Weight: 90.7 kg (199 lb 15.3 oz) IBW/kg (Calculated) : 70.7 HEPARIN  DW (KG): 93.4  Vital Signs: Temp: 99.7 F (37.6 C) (01/18 2015) Temp Source: Esophageal (01/18 2000) BP: 102/53 (01/18 1646) Pulse Rate: 70 (01/18 2015)  Labs: Recent Labs    12/20/24 1844 12/20/24 1940 12/20/24 2015 12/21/24 0001 12/21/24 0142 12/21/24 0348 12/21/24 0434 12/21/24 0657 12/21/24 1600 12/21/24 1958  HGB 6.5*   < > 8.0*   < >  --  10.9* 8.6* 10.9*  --   --   HCT 26.9*   < > 27.8*   < >  --  32.0* 29.4* 32.0*  --   --   PLT 224  --  274  --   --   --  326  --   --   --   APTT  --   --   --   --   --   --   --   --   --  64*  LABPROT 37.8*  --   --   --   --   --   --   --   --   --   INR 3.6*  --   --   --   --   --   --   --   --   --   HEPARINUNFRC  --   --   --   --   --   --   --   --   --  >1.10*  CREATININE 1.53*  --  1.82*  --  1.96*  --  2.00*  --  1.77*  --    < > = values in this interval not displayed.    Estimated Creatinine Clearance: 50.6 mL/min (A) (by C-G formula based on SCr of 1.77 mg/dL (H)).   Medical History: Past Medical History:  Diagnosis Date   Acute myocardial infarction of other anterior wall, subsequent episode of care 12/12/2011   Anal fissure    Arthritis    back   Atherosclerosis of native artery of both lower extremities with intermittent claudication 04/01/2018   Bruises easily    d/t being on Effient    CAD (coronary artery disease)    A. Acute Ant STEMI 12/12/2011    Cardiomyopathy secondary 12/26/2011   Chronic systolic CHF (congestive heart failure) (HCC)    ischemic CM // Echo 6/21: EF 30-35  //  Echocardiogram 10/21: LV apical thrombus resolved, EF 50-55, ant and apical, ant-lat HK, trivial MR, trivial AI   Chronic total occlusion of artery of the extremities 02/26/2012    Claudication 12/26/2011   Coronary atherosclerosis of native coronary artery    a. ant STEMI with cardiac arrest 2013 s/p DES to mLAD. b. [place]  10/2016 s/p PTCA to mLAD.   Diabetes mellitus without complication (HCC)    Essential hypertension 10/23/2016   GERD (gastroesophageal reflux disease)    takes tums   History of blood transfusion    no abnormal reaction noted   History of kidney stones    Hypertension    Ischemic cardiomyopathy    a. EF 35% in 02/2012 at time of acute MI, improved to normal on subsequent imaging.   MI (myocardial infarction) (HCC)    AMI 1/13 - complicated by VT/Tosades   Mixed hyperlipidemia    NSTEMI (non-ST elevation myocardial infarction) (HCC) 03/26/2023  Numbness and tingling of right arm 03/31/2013   Occlusion and stenosis of carotid artery without mention of cerebral infarction 08/26/2012   PAD (peripheral artery disease)    followed by Vascular - aortobifem bypass 01/2013 & left carotid-subclavian artery bypass 04/2013   Paroxysmal atrial flutter (HCC) 07/20/2024   Peripheral vascular disease, unspecified 12/16/2012   PVD (peripheral vascular disease) 03/31/2013   Rectal polyp    ST elevation myocardial infarction involving left anterior descending (LAD) coronary artery (HCC) 07/31/2017   Subclavian steal syndrome 08/11/2013   Syncope 07/20/2024   Uncontrolled diabetes mellitus 10/23/2016   Unstable angina (HCC)    Wears partial dentures    upper    Medications:  Scheduled:   arformoterol   15 mcg Nebulization BID   And   umeclidinium bromide   1 puff Inhalation Daily   Chlorhexidine  Gluconate Cloth  6 each Topical Daily   clopidogrel   75 mg Oral Daily   ezetimibe   10 mg Oral Daily   insulin  aspart  0-15 Units Subcutaneous Q4H   [START ON 12/22/2024] insulin  glargine  20 Units Subcutaneous Daily   midazolam  PF  2 mg Intravenous Once   mouth rinse  15 mL Mouth Rinse Q2H   pantoprazole  (PROTONIX ) IV  40 mg Intravenous QHS   sodium chloride   flush  3 mL Intravenous Q12H    Assessment: 58 YOM admitted on 12/19/23 now s/p PEA arrest. Patient fell in bathroom, head CT unremarkable. Patient on Eliquis  PTA for history of atrial flutter s/p ablation. Patient started on Eliquis  earlier this admission, last dose administered on 12/20/24 at 1030. Pharmacy consulted for heparin  dosing.   Heparin  drip 1350 uts/hr with aptt 64sec slightly < goal.  Heparin  level as expected >1.1 - affected by recent apixaban  administration,  will monitor aPTT and heparin  levels until correlating.   CBC stable (Hgb 8.6, PLT 326).  Goal of Therapy:  Heparin  level 66-102 units/ml aPTT 0.3-0.5 seconds Monitor platelets by anticoagulation protocol: Yes   Plan:  Increase heparin  1400 units/hr Monitor aPTT and anti-Xa level daily until correlating Monitor CBC and s/sx of bleeding daily   Olam Chalk Pharm.D. CPP, BCPS Clinical Pharmacist [phone] 12/21/2024 8:43 PM       [1]  Allergies Allergen Reactions   Metformin Anaphylaxis    Water  blisters between fingers   Hydrocodone  Hives and Nausea And Vomiting

## 2024-12-21 NOTE — Progress Notes (Cosign Needed Addendum)
 Routine EEG complete. Results pending.

## 2024-12-21 NOTE — Plan of Care (Signed)
" °  Problem: Education: Goal: Individualized Educational Video(s) Outcome: Not Progressing   Problem: Coping: Goal: Ability to adjust to condition or change in health will improve Outcome: Not Progressing   Problem: Fluid Volume: Goal: Ability to maintain a balanced intake and output will improve Outcome: Not Progressing   Problem: Health Behavior/Discharge Planning: Goal: Ability to manage health-related needs will improve Outcome: Not Progressing   "

## 2024-12-21 NOTE — Consult Note (Signed)
 "  NAME:  Ryan [name], MRN:  [id number], DOB:  11-Nov-1966, LOS: 3 ADMISSION DATE:  12/18/2024, CONSULTATION DATE:  12/20/24 REFERRING MD: [facility], CHIEF COMPLAINT: Cardiac arrest  History of Present Illness:  Ryan [name] is a 59 year old male with a past medical history significant for combined CHF, emphysema, chronic respiratory failure, paroxysmal A-fib, HTN, HLD, prior CABG with CAD, PAD, and carotid artery disease who initially presented to the ED at [facility] 1/15 for worsening shortness of breath and edema.  Admission workup consistent with CHF exacerbation for which patient was admitted to [facility]  Late afternoon 1/17 patient suffered in-hospital cardiac arrest with estimated downtime of 16 minutes.  PCCM consulted for further assistance postarrest  Pertinent  Medical History  CHF, emphysema, chronic respiratory failure, paroxysmal A-fib, HTN, HLD, prior CABG with CAD, PAD, and carotid artery disease   Significant Hospital Events: Including procedures, antibiotic start and stop dates in addition to other pertinent events   1/15 presented with increased work of breathing and edema admitted for acute exacerbation CHF 1/17 suffered an hospital bradycardic to PEA cardiac arrest, estimated downtime 16 minutes 1/18 started CRRT for volume removal after unsuccessful diuretic challenge  Interim History / Subjective:  Unresponsive postarrest. When sedation was weaned, RN noted some jerking movements of his torso felt possibly c/w myoclonic seizures.  Objective    Blood pressure (!) 102/53, pulse 71, temperature 99.9 F (37.7 C), resp. rate (!) 22, height 5' 9 (1.753 m), weight 90.7 kg, SpO2 96%. CVP:  [18 mmHg-28 mmHg] 18 mmHg  Vent Mode: PRVC FiO2 (%):  [40 %-60 %] 40 % Set Rate:  [22 bmp] 22 bmp Vt Set:  [560 mL] 560 mL PEEP:  [5 cmH20] 5 cmH20 Plateau Pressure:  [20 cmH20-27 cmH20] 20 cmH20   Intake/Output Summary (Last 24 hours) at 12/21/2024 1856 Last data filed at 12/21/2024 1800 Gross  per 24 hour  Intake 2409.4 ml  Output 3026 ml  Net -616.6 ml   Filed Weights   12/19/24 0300 12/20/24 0440 12/21/24 0500  Weight: 102.5 kg 105 kg 90.7 kg    Examination: General: Acute on chronic ill-appearing middle-age male lying in bed on mechanical ventilation postarrest HEENT: ETT, MM pink/moist, PERRL,  Neuro: Unresponsive, ? Myoclonic jerks noted during sedation wean -> resedated CV: s1s2 regular rate and rhythm, no murmur, rubs, or gallops,  PULM: Mechanical breath sounds b/l GI: soft, bowel sounds active in all 4 quadrants, non-tender, non-distended Extremities: warm/dry, edematous  Skin: no rashes or lesions   Resolved problem list   Assessment and Plan  In-hospital cardiac arrest PEA arrest. Estimated duration of CPR: 16 minutes. NCHCT without acute abnormality post-arrest. P: - cvEEG ordered. Once in place, wean sedation while monitoring clinically for e/o myoclonic seizure activity. - MRI and neuro c/s at 72+ hrs post-arrest if no recovery of mental status prior to then - Prevent fever, hypoxemia in post-arrest setting. - Treat underlying cause of arrest (severe biventricular failure, esp RV failure, in setting of massive volume overload).  Acute on chronic combined systolic congestive heart failure - Echo 1/17 with EF 35 to 40%, no observed apical thrombus on this echo there was concern for 1 day prior, RV function severely reduced CAD s/p CABG P: Primary management per heart failure CRRT for massive ultrafiltration / volume removal.  Acute on chronic hypoxic respiratory failure in the setting of cardiac arrest History of COPD -Intubated during arrest  P: Continue ventilator support with lung protective strategies  Wean PEEP and FiO2 for  sats greater than 90%. Head of bed elevated 30 degrees. Plateau pressures less than 30 cm H20.  Follow intermittent chest x-ray and ABG.   SAT/SBT as tolerated, mentation preclude extubation  Ensure adequate pulmonary  hygiene  Follow cultures  VAP bundle in place  PAD protocol  Atrial fibs/atrial fibrillation -S/p flutter ablation 12/25 P: Optimize electrolytes Continuous telemetry Heparin  drip in place of Eliquis  for now.  PAD with carotid and femoral disease P: Hold anticoagulation until head CT as above  Type 2 diabetes P: Continue SSI CBG checks every 4  Hyponatremia P: Trend  Hypertension Hyperlipidemia P: Hold home medications postarrest   Labs   CBC: Recent Labs  Lab 12/19/24 0259 12/20/24 0229 12/20/24 1844 12/20/24 1940 12/20/24 2015 12/21/24 0001 12/21/24 0348 12/21/24 0434 12/21/24 0657  WBC 7.3 10.3 13.6*  --  22.6*  --   --  23.7*  --   HGB 8.3* 8.3* 6.5*   < > 8.0* 10.5* 10.9* 8.6* 10.9*  HCT 28.6* 28.3* 26.9*   < > 27.8* 31.0* 32.0* 29.4* 32.0*  MCV 77.5* 75.9* 92.8  --  78.1*  --   --  77.2*  --   PLT 215 228 224  --  274  --   --  326  --    < > = values in this interval not displayed.    Basic Metabolic Panel: Recent Labs  Lab 12/18/24 1515 12/19/24 0259 12/19/24 0301 12/19/24 1434 12/20/24 0229 12/20/24 0230 12/20/24 1844 12/20/24 1940 12/20/24 2015 12/21/24 0001 12/21/24 0142 12/21/24 0348 12/21/24 0434 12/21/24 0657 12/21/24 1600  NA  --  131* 132*   < > 127* 127* 108*   < > 129*   < > 126* 127* 126* 126* 129*  K  --  2.9* 2.9*   < > 3.7 3.7 2.7*   < > 3.4*   < > 4.1 4.1 4.6 4.6 4.7  CL  --  87* 87*   < > 86* 86* <65*  --  88*  --  86*  --  86*  --  90*  CO2  --  33* 33*   < > 30 30 15*  --  26  --  24  --  24  --  25  GLUCOSE  --  225* 223*   < > 127* 126* 1,117*  --  77  --  120*  --  131*  --  111*  BUN  --  48* 48*   < > 43* 44* 33*  --  43*  --  47*  --  47*  --  39*  CREATININE  --  1.51* 1.50*   < > 1.63* 1.61* 1.53*  --  1.82*  --  1.96*  --  2.00*  --  1.77*  CALCIUM   --  8.4* 8.5*   < > 8.4* 8.5* 6.9*  --  8.7*  --  8.7*  --  8.9  --  8.6*  MG 2.5* 2.7*  --   --  2.6*  --   --   --   --   --   --   --  3.5*  --   --    PHOS  --   --  4.3  --   --  4.2  --   --   --   --   --   --  5.3*  --  4.5   < > = values in this interval not displayed.  GFR: Estimated Creatinine Clearance: 50.6 mL/min (A) (by C-G formula based on SCr of 1.77 mg/dL (H)). Recent Labs  Lab 12/20/24 0229 12/20/24 1843 12/20/24 1844 12/20/24 2015 12/20/24 2024 12/21/24 0434 12/21/24 0522 12/21/24 1202 12/21/24 1722  WBC 10.3  --  13.6* 22.6*  --  23.7*  --   --   --   LATICACIDVEN  --    < >  --   --  3.7*  --  6.0* 2.7* 1.5   < > = values in this interval not displayed.    Liver Function Tests: Recent Labs  Lab 12/19/24 0259 12/19/24 0301 12/20/24 0230 12/20/24 1844 12/20/24 2015 12/21/24 0434 12/21/24 1600  AST 26  --   --  29 28  --   --   ALT 15  --   --  10 14  --   --   ALKPHOS 379*  --   --  220* 278*  --   --   BILITOT 1.2  --   --  1.0 1.5*  --   --   PROT 7.2  --   --  5.5* 6.9  --   --   ALBUMIN  3.4*   < > 3.5 2.6* 3.3* 3.4* 3.5   < > = values in this interval not displayed.   No results for input(s): LIPASE, AMYLASE in the last 168 hours. No results for input(s): AMMONIA in the last 168 hours.  ABG    Component Value Date/Time   PHART 7.406 12/21/2024 0657   PCO2ART 35.5 12/21/2024 0657   PO2ART 95 12/21/2024 0657   HCO3 22.5 12/21/2024 0657   TCO2 24 12/21/2024 0657   ACIDBASEDEF 2.0 12/21/2024 0657   O2SAT 57.1 12/21/2024 1410     Coagulation Profile: Recent Labs  Lab 12/20/24 1844  INR 3.6*    Cardiac Enzymes: No results for input(s): CKTOTAL, CKMB, CKMBINDEX, TROPONINI in the last 168 hours.  HbA1C: Hgb A1c MFr Bld  Date/Time Value Ref Range Status  07/18/2024 06:18 AM 12.3 (H) 4.8 - 5.6 % Final    Comment:    (NOTE) Diagnosis of Diabetes The following HbA1c ranges recommended by the American Diabetes Association (ADA) may be used as an aid in the diagnosis of diabetes mellitus.  Hemoglobin             Suggested A1C NGSP%              Diagnosis  <5.7                    Non Diabetic  5.7-6.4                Pre-Diabetic  >6.4                   Diabetic  <7.0                   Glycemic control for                       adults with diabetes.    03/01/2024 10:37 AM 8.9 (H) 4.8 - 5.6 % Final    Comment:    (NOTE) Pre diabetes:          5.7%-6.4%  Diabetes:              >6.4%  Glycemic control for   <7.0% adults with diabetes     CBG: Recent Labs  Lab 12/20/24 2349 12/21/24  9683 12/21/24 0819 12/21/24 1158 12/21/24 1557  GLUCAP 86 97 133* 89 119*    Review of Systems:   Unable to assess   Past Medical History:  He,  has a past medical history of Acute myocardial infarction of other anterior wall, subsequent episode of care (12/12/2011), Anal fissure, Arthritis, Atherosclerosis of native artery of both lower extremities with intermittent claudication (04/01/2018), Bruises easily, CAD (coronary artery disease), Cardiomyopathy secondary (12/26/2011), Chronic systolic CHF (congestive heart failure) (HCC), Chronic total occlusion of artery of the extremities (02/26/2012), Claudication (12/26/2011), Coronary atherosclerosis of native coronary artery, Diabetes mellitus without complication (HCC), Essential hypertension (10/23/2016), GERD (gastroesophageal reflux disease), History of blood transfusion, History of kidney stones, Hypertension, Ischemic cardiomyopathy, MI (myocardial infarction) (HCC), Mixed hyperlipidemia, NSTEMI (non-ST elevation myocardial infarction) (HCC) (03/26/2023), Numbness and tingling of right arm (03/31/2013), Occlusion and stenosis of carotid artery without mention of cerebral infarction (08/26/2012), PAD (peripheral artery disease), Paroxysmal atrial flutter (HCC) (07/20/2024), Peripheral vascular disease, unspecified (12/16/2012), PVD (peripheral vascular disease) (03/31/2013), Rectal polyp, ST elevation myocardial infarction involving left anterior descending (LAD) coronary artery (HCC) (07/31/2017), Subclavian steal  syndrome (08/11/2013), Syncope (07/20/2024), Uncontrolled diabetes mellitus (10/23/2016), Unstable angina (HCC), and Wears partial dentures.   Surgical History:   Past Surgical History:  Procedure Laterality Date   A-FLUTTER ABLATION N/A 05/05/2024   Procedure: A-FLUTTER ABLATION;  Surgeon: [name], MD;  Location: [facility] INVASIVE CV LAB;  Service: Cardiovascular;  Laterality: N/A;   ABDOMINAL AORTOGRAM W/LOWER EXTREMITY N/A 06/26/2017   Procedure: Abdominal Aortogram w/Lower Extremity;  Surgeon: Serene Gaile ORN, MD;  Location: [facility] INVASIVE CV LAB;  Service: Cardiovascular;  Laterality: N/A;   ABDOMINAL AORTOGRAM W/LOWER EXTREMITY N/A 06/28/2021   Procedure: ABDOMINAL AORTOGRAM W/LOWER EXTREMITY;  Surgeon: Serene Gaile ORN, MD;  Location: [facility] INVASIVE CV LAB;  Service: Cardiovascular;  Laterality: N/A;   ABDOMINAL AORTOGRAM W/LOWER EXTREMITY N/A 01/02/2023   Procedure: ABDOMINAL AORTOGRAM W/LOWER EXTREMITY;  Surgeon: Serene Gaile ORN, MD;  Location: [facility] INVASIVE CV LAB;  Service: Cardiovascular;  Laterality: N/A;   ANAL FISSURECTOMY     AORTA - BILATERAL FEMORAL ARTERY BYPASS GRAFT N/A 01/16/2013   Procedure: AORTA BIFEMORAL BYPASS GRAFT;  Surgeon: Gaile ORN Serene, MD;  Location: [facility] OR;  Service: Vascular;  Laterality: N/A;   APPENDECTOMY     ATRIAL FIBRILLATION ABLATION N/A 11/12/2024   Procedure: ATRIAL FIBRILLATION ABLATION;  Surgeon: [name], MD;  Location: [facility] INVASIVE CV LAB;  Service: Cardiovascular;  Laterality: N/A;   CARDIAC CATHETERIZATION N/A 10/23/2016   Procedure: Left Heart Cath and Coronary Angiography;  Surgeon: Lonni JONETTA Cash, MD;  Location: [facility] INVASIVE CV LAB;  Service: Cardiovascular;  Laterality: N/A;   CARDIAC CATHETERIZATION N/A 10/23/2016   Procedure: Coronary Balloon Angioplasty;  Surgeon: Lonni JONETTA Cash, MD;  Location: [facility] INVASIVE CV LAB;  Service: Cardiovascular;  Laterality: N/A;   CARDIOVERSION N/A 03/31/2024   Procedure: CARDIOVERSION;  Surgeon:  Zenaida Morene PARAS, MD;  Location: [facility] INVASIVE CV LAB;  Service: Cardiovascular;  Laterality: N/A;   CAROTID-SUBCLAVIAN BYPASS GRAFT Left 04/03/2013   Procedure: BYPASS GRAFT CAROTID-SUBCLAVIAN;  Surgeon: Gaile ORN Serene, MD;  Location: [facility] OR;  Service: Vascular;  Laterality: Left;   CIRCUMCISION N/A 12/15/2015   Procedure: CIRCUMCISION ADULT;  Surgeon: Belvie LITTIE Clara, MD;  Location: [facility] ORS;  Service: Urology;  Laterality: N/A;   COLONOSCOPY     CORONARY ANGIOPLASTY     stent placed Dec 12, 2011 and 2018   CORONARY ARTERY BYPASS GRAFT N/A 05/05/2020   Procedure: CORONARY ARTERY  BYPASS GRAFTING (CABG) x Three, Using Left internal Mammary Artery and Left Leg greater saphenous vein harvested endoscopically;  Surgeon: Army Dallas NOVAK, MD;  Location: [facility] OR;  Service: Open Heart Surgery;  Laterality: N/A;   CORONARY BALLOON ANGIOPLASTY N/A 05/23/2019   Procedure: CORONARY BALLOON ANGIOPLASTY;  Surgeon: Verlin Lonni BIRCH, MD;  Location: [facility] INVASIVE CV LAB;  Service: Cardiovascular;  Laterality: N/A;   CORONARY PRESSURE/FFR STUDY N/A 04/22/2020   Procedure: INTRAVASCULAR PRESSURE WIRE/FFR STUDY;  Surgeon: Verlin Lonni BIRCH, MD;  Location: [facility] INVASIVE CV LAB;  Service: Cardiovascular;  Laterality: N/A;   DIAGNOSTIC LAPAROSCOPY     ENDARTERECTOMY FEMORAL Right 03/13/2018   Procedure: FEMORAL ENDARTERECTOMY RIGHT;  Surgeon: Serene Gaile ORN, MD;  Location: [facility] OR;  Service: Vascular;  Laterality: Right;   ENDARTERECTOMY FEMORAL Left 01/26/2023   Procedure: REDO LEFT COMMON FEMORAL ENDARTERECTOMY WITH VPA;  Surgeon: Serene Gaile ORN, MD;  Location: [facility] OR;  Service: Vascular;  Laterality: Left;   FEMORAL-POPLITEAL BYPASS GRAFT Right 03/13/2018   Procedure: FEMORAL-POPLITEAL ARTERY BYPASS WITH NON-REVERSE VEIN RIGHT;  Surgeon: Serene Gaile ORN, MD;  Location: [facility] OR;  Service: Vascular;  Laterality: Right;   GROIN DISSECTION Right 03/13/2018   Procedure: RE-DO COMMON FEMORAL ARTERY EXPOSURE;  Surgeon:  Serene Gaile ORN, MD;  Location: [facility] OR;  Service: Vascular;  Laterality: Right;   I & D EXTREMITY Left 04/18/2013   Procedure: debridement of left neck lymphocele;  Surgeon: Gaile ORN Serene, MD;  Location: [facility] OR;  Service: Vascular;  Laterality: Left;  I and D of left neck   I & D EXTREMITY Right 11/21/2014   Procedure: IRRIGATION AND DEBRIDEMENT RIGHT HAND;  Surgeon: Elsie Mussel, MD;  Location: [facility] OR;  Service: Orthopedics;  Laterality: Right;   LEFT HEART CATH AND CORONARY ANGIOGRAPHY N/A 05/23/2019   Procedure: LEFT HEART CATH AND CORONARY ANGIOGRAPHY;  Surgeon: Verlin Lonni BIRCH, MD;  [facility] INVASIVE CV LAB;  Service: Cardiovascular;  Laterality: N/A;   LEFT HEART CATH AND CORONARY ANGIOGRAPHY N/A 04/22/2020   Procedure: LEFT HEART CATH AND CORONARY ANGIOGRAPHY;  Surgeon: Verlin Lonni BIRCH, MD;  Location: [facility] INVASIVE CV LAB;  Service: Cardiovascular;  Laterality: N/A;   LEFT HEART CATHETERIZATION WITH CORONARY ANGIOGRAM N/A 12/12/2011   Procedure: LEFT HEART CATHETERIZATION WITH CORONARY ANGIOGRAM;  Surgeon: Lonni BIRCH Verlin, MD;  Location: [facility] CATH LAB;  Service: Cardiovascular;  Laterality: N/A;   LOOP RECORDER INSERTION N/A 05/05/2024   Procedure: LOOP RECORDER INSERTION;  Surgeon: [name], MD;  Location: [facility] INVASIVE CV LAB;  Service: Cardiovascular;  Laterality: N/A;   LOWER EXTREMITY ANGIOGRAM N/A 01/31/2012   Procedure: LOWER EXTREMITY ANGIOGRAM;  Surgeon: Lonni BIRCH Verlin, MD;  Location: [facility] CATH LAB;  Service: Cardiovascular;  Laterality: N/A;   LOWER EXTREMITY ANGIOGRAPHY N/A 04/23/2018   Procedure: LOWER EXTREMITY ANGIOGRAPHY;  Surgeon: Serene Gaile ORN, MD;  [facility] INVASIVE CV LAB;  Service: Cardiovascular;  Laterality: N/A;   PATCH ANGIOPLASTY Left 01/26/2023   Procedure: PATCH ANGIOPLASTY USING HEMASHIELD PLATINUM FINESSE;  Surgeon: Serene Gaile ORN, MD;  Location[facility] OR;  Service: Vascular;  Laterality: Left;   PERCUTANEOUS CORONARY STENT  INTERVENTION (PCI-S) Right 12/12/2011   Procedure: PERCUTANEOUS CORONARY STENT INTERVENTION (PCI-S);  Surgeon: Lonni BIRCH Verlin, MD;  Location: [facility] CATH LAB;  Service: Cardiovascular;  Laterality: Right;   PERIPHERAL VASCULAR BALLOON ANGIOPLASTY Right 06/28/2021   Procedure: PERIPHERAL VASCULAR BALLOON ANGIOPLASTY;  Surgeon: Serene Gaile ORN, MD;  [facility] INVASIVE CV LAB;  Service: Cardiovascular;  Laterality: Right;  common femoral (graft)   PERIPHERAL VASCULAR BALLOON ANGIOPLASTY  01/02/2023   Procedure: PERIPHERAL VASCULAR BALLOON ANGIOPLASTY;  Surgeon: Serene Gaile ORN, MD;  Location: [facility] INVASIVE CV LAB;  Service: Cardiovascular;;   PERIPHERAL VASCULAR INTERVENTION Left 04/23/2018   Procedure: PERIPHERAL VASCULAR INTERVENTION;  Surgeon: Serene Gaile ORN, MD;  [facility] INVASIVE CV LAB;  Service: Cardiovascular;  Laterality: Left;  superficial femoral   REPAIR EXTENSOR TENDON Right 11/21/2014   Procedure: WITH REPAIR/RECONSTRUCTION OF EXTENSOR TENDONS AS NEEDED;  Surgeon: Elsie Mussel, MD;  Location: [facility] OR;  Service: Orthopedics;  Laterality: Right;   TEE WITHOUT CARDIOVERSION N/A 05/05/2020   Procedure: TRANSESOPHAGEAL ECHOCARDIOGRAM (TEE);  Surgeon: Army Dallas NOVAK, MD;  [facility];  Service: Open Heart Surgery;  Laterality: N/A;     Social History:   reports that he has quit smoking. His smoking use included cigarettes. He has a 6.3 pack-year smoking history. He has never used smokeless tobacco. He reports current alcohol  use of about 2.0 standard drinks of alcohol  per week. He reports that he does not use drugs.   Family History:  His family history includes COPD in his mother; Cancer in his father and maternal uncle; Diabetes in his mother.   Allergies Allergies[1]   Home Medications  Prior to Admission medications  Medication Sig Start Date End Date Taking? Authorizing Provider  albuterol  (VENTOLIN  HFA) 108 (90 Base) MCG/ACT inhaler Inhale 1-2 puffs into the lungs  every 6 (six) hours as needed for wheezing or shortness of breath.   Yes [provider]  cetirizine (ZYRTEC) 10 MG tablet Take 10 mg by mouth daily as needed for allergies. 06/02/21  Yes [provider]  clopidogrel  (PLAVIX ) 75 MG tablet TAKE 1 TABLET BY MOUTH EVERY DAY 09/01/24  Yes Verlin Lonni BIRCH, MD  diazepam  (VALIUM ) 10 MG tablet Take 10 mg by mouth at bedtime. 03/10/21  Yes [provider]  ELIQUIS  5 MG TABS tablet TAKE 1 TABLET BY MOUTH 2 TIMES A DAY 09/29/24  Yes [name], MD  ezetimibe  (ZETIA ) 10 MG tablet TAKE 1 TABLET BY MOUTH EACH DAY 03/07/24  Yes Weaver, Scott T, PA-C  Furosemide  (FUROSCIX ) 80 MG/10ML CTKT Inject 80 mg into the skin once daily as directed. 12/15/24  Yes Milford, [name], FNP  gabapentin  (NEURONTIN ) 300 MG capsule Take 300 mg by mouth 3 (three) times daily. 03/25/20  Yes [provider]  icosapent  Ethyl (VASCEPA ) 1 g capsule TAKE 2 CAPSULES BY MOUTH TWICE DAILY 05/30/24  Yes Lelon Hamilton T, PA-C  insulin  glargine (LANTUS ) 100 UNIT/ML Solostar Pen Inject 50 Units into the skin 2 (two) times daily.   Yes [provider]  insulin  lispro (HUMALOG ) 100 UNIT/ML injection Inject 0-15 Units into the skin 3 (three) times daily as needed for high blood sugar.   Yes [provider]  methocarbamol  (ROBAXIN ) 500 MG tablet Take 500 mg by mouth every 6 (six) hours as needed for muscle spasms. 11/05/24  Yes [provider]  nitroGLYCERIN  (NITROSTAT ) 0.4 MG SL tablet Place 0.4 mg under the tongue every 5 (five) minutes as needed for chest pain.   Yes [provider]  oxyCODONE -acetaminophen  (PERCOCET) 10-325 MG tablet Take 1 tablet by mouth 4 (four) times daily.   Yes [provider]  pantoprazole  (PROTONIX ) 40 MG tablet Take 40 mg by mouth daily. 03/07/24  Yes [provider]  potassium chloride  SA (KLOR-CON  M) 20 MEQ tablet Take 3 tablets (60 mEq total) by mouth 2 (two) times daily. Take  an  extra 2 tablets (40 meq total) when you take Metolazone . 12/01/24  Yes Milford, [name], FNP  pseudoephedrine (SUDAFED) 30 MG tablet Take 30 mg by mouth every 4 (four) hours as needed for congestion.   Yes [provider]  ramelteon  (ROZEREM ) 8 MG tablet TAKE 1 TABLET (8 MG TOTAL) BY MOUTH AT BEDTIME. 12/01/24  Yes Olalere, Adewale A, MD  REPATHA  SURECLICK 140 MG/ML SOAJ INJECT 140 MG INTO THE SKIN EVERY 14 DAYS AS DIRECTED 05/30/24  Yes [name], MD  Tiotropium Bromide-Olodaterol (STIOLTO RESPIMAT ) 2.5-2.5 MCG/ACT AERS INHALE 2 PUFFS INTO THE LUNGS DAILY. 12/01/24  Yes Olalere, Adewale A, MD  torsemide  (DEMADEX ) 20 MG tablet Take 4 tablets (80 mg total) by mouth 2 (two) times daily. 11/28/24  Yes Clegg, [name], NP  traZODone  (DESYREL ) 100 MG tablet Take 100 mg by mouth at bedtime.   Yes [provider]  TRINTELLIX  20 MG TABS tablet Take 20 mg by mouth daily in the afternoon. 12/29/22  Yes [provider]  ACCU-CHEK GUIDE TEST test strip  12/01/24   [provider]  ADVOCATE INSULIN  SYRINGE 31G X 5/16 1 ML MISC  12/01/24   [provider]  GNP PEN NEEDLES 32G X 4 MM MISC  12/01/24   [provider]  metolazone  (ZAROXOLYN ) 5 MG tablet Take 1 tablet (5 mg total) by mouth daily. Patient not taking: Reported on 12/18/2024 12/15/24   Glena [name], FNP  OXYGEN Inhale 2 L into the lungs as needed (excessive movement).    [provider]     Critical care time:   CRITICAL CARE Performed by: Lamar PARAS Marcellius Montagna   Total critical care time: 43 minutes  Critical care time was exclusive of separately billable procedures and treating other patients.  Critical care was necessary to treat or prevent imminent or life-threatening deterioration.  Critical care was time spent personally by me on the following activities: development of treatment plan with patient and/or surrogate as well as nursing, discussions with consultants, evaluation  of patient's response to treatment, examination of patient, obtaining history from patient or surrogate, ordering and performing treatments and interventions, ordering and review of laboratory studies, ordering and review of radiographic studies, pulse oximetry and re-evaluation of patient's condition.  Whitney D. Harris, NP-[facility] Personal contact information can be found on Amion  If no contact or response made please call [phone] 12/21/2024, 6:56 PM             [1]  Allergies Allergen Reactions   Metformin Anaphylaxis    Water  blisters between fingers   Hydrocodone  Hives and Nausea And Vomiting   "

## 2024-12-21 NOTE — Progress Notes (Signed)
 PHARMACY - ANTICOAGULATION CONSULT NOTE  Pharmacy Consult for heparin  Indication: atrial fibrillation  Allergies[1]  Patient Measurements: Height: 5' 9 (175.3 cm) Weight: 90.7 kg (199 lb 15.3 oz) IBW/kg (Calculated) : 70.7 HEPARIN  DW (KG): 93.4  Vital Signs: Temp: 97.8 F (36.6 C) (01/18 1138) Temp Source: Axillary (01/18 1138) BP: 106/57 (01/18 1219) Pulse Rate: 70 (01/18 1219)  Labs: Recent Labs    12/20/24 1844 12/20/24 1940 12/20/24 2015 12/21/24 0001 12/21/24 0142 12/21/24 0348 12/21/24 0434 12/21/24 0657  HGB 6.5*   < > 8.0*   < >  --  10.9* 8.6* 10.9*  HCT 26.9*   < > 27.8*   < >  --  32.0* 29.4* 32.0*  PLT 224  --  274  --   --   --  326  --   LABPROT 37.8*  --   --   --   --   --   --   --   INR 3.6*  --   --   --   --   --   --   --   CREATININE 1.53*  --  1.82*  --  1.96*  --  2.00*  --    < > = values in this interval not displayed.    Estimated Creatinine Clearance: 44.8 mL/min (A) (by C-G formula based on SCr of 2 mg/dL (H)).   Medical History: Past Medical History:  Diagnosis Date   Acute myocardial infarction of other anterior wall, subsequent episode of care 12/12/2011   Anal fissure    Arthritis    back   Atherosclerosis of native artery of both lower extremities with intermittent claudication 04/01/2018   Bruises easily    d/t being on Effient    CAD (coronary artery disease)    A. Acute Ant STEMI 12/12/2011    Cardiomyopathy secondary 12/26/2011   Chronic systolic CHF (congestive heart failure) (HCC)    ischemic CM // Echo 6/21: EF 30-35  //  Echocardiogram 10/21: LV apical thrombus resolved, EF 50-55, ant and apical, ant-lat HK, trivial MR, trivial AI   Chronic total occlusion of artery of the extremities 02/26/2012   Claudication 12/26/2011   Coronary atherosclerosis of native coronary artery    a. ant STEMI with cardiac arrest 2013 s/p DES to mLAD. b. [place]  10/2016 s/p PTCA to mLAD.   Diabetes mellitus without complication (HCC)     Essential hypertension 10/23/2016   GERD (gastroesophageal reflux disease)    takes tums   History of blood transfusion    no abnormal reaction noted   History of kidney stones    Hypertension    Ischemic cardiomyopathy    a. EF 35% in 02/2012 at time of acute MI, improved to normal on subsequent imaging.   MI (myocardial infarction) (HCC)    AMI 1/13 - complicated by VT/Tosades   Mixed hyperlipidemia    NSTEMI (non-ST elevation myocardial infarction) (HCC) 03/26/2023   Numbness and tingling of right arm 03/31/2013   Occlusion and stenosis of carotid artery without mention of cerebral infarction 08/26/2012   PAD (peripheral artery disease)    followed by Vascular - aortobifem bypass 01/2013 & left carotid-subclavian artery bypass 04/2013   Paroxysmal atrial flutter (HCC) 07/20/2024   Peripheral vascular disease, unspecified 12/16/2012   PVD (peripheral vascular disease) 03/31/2013   Rectal polyp    ST elevation myocardial infarction involving left anterior descending (LAD) coronary artery (HCC) 07/31/2017   Subclavian steal syndrome 08/11/2013   Syncope 07/20/2024  Uncontrolled diabetes mellitus 10/23/2016   Unstable angina (HCC)    Wears partial dentures    upper    Medications:  Scheduled:   arformoterol   15 mcg Nebulization BID   And   umeclidinium bromide   1 puff Inhalation Daily   Chlorhexidine  Gluconate Cloth  6 each Topical Daily   clopidogrel   75 mg Oral Daily   ezetimibe   10 mg Oral Daily   insulin  aspart  0-15 Units Subcutaneous TID WC   insulin  aspart  3 Units Subcutaneous TID WC   insulin  glargine  20 Units Subcutaneous BID   midazolam  PF  2 mg Intravenous Once   mouth rinse  15 mL Mouth Rinse Q2H   pantoprazole  (PROTONIX ) IV  40 mg Intravenous QHS   sodium chloride  flush  3 mL Intravenous Q12H    Assessment: 58 YOM admitted on 12/19/23 now s/p PEA arrest. Patient fell in bathroom, CT unremarkable. Patient on Eliquis  PTA for history of atrial flutter s/p  ablation. Patient started on Eliquis  earlier this admission, last dose administered on 12/20/24 at 1030. Pharmacy consulted for heparin  dosing.   12/21/24: CBC stable (Hgb 8.6, PLT 326). With recent DOAC administration will monitor aPTT and heparin  levels until correlating.    Goal of Therapy:  Heparin  level 66-102 units/ml aPTT 0.3-0.5 seconds Monitor platelets by anticoagulation protocol: Yes   Plan:  Start heparin  1350 units/hr Check aPTT in 6 hours Monitor aPTT and anti-Xa level daily until correlating Monitor CBC and s/sx of bleeding daily F/u ability to restart DOAC  Daquane Aguilar, PharmD, BCPS PGY2 Cardiology Pharmacy Resident 12/21/2024 1:15 PM    [1]  Allergies Allergen Reactions   Metformin Anaphylaxis    Water  blisters between fingers   Hydrocodone  Hives and Nausea And Vomiting

## 2024-12-22 ENCOUNTER — Ambulatory Visit (HOSPITAL_COMMUNITY)

## 2024-12-22 ENCOUNTER — Encounter

## 2024-12-22 ENCOUNTER — Inpatient Hospital Stay (HOSPITAL_COMMUNITY)

## 2024-12-22 DIAGNOSIS — G4089 Other seizures: Secondary | ICD-10-CM

## 2024-12-22 DIAGNOSIS — I5023 Acute on chronic systolic (congestive) heart failure: Secondary | ICD-10-CM | POA: Diagnosis not present

## 2024-12-22 DIAGNOSIS — I110 Hypertensive heart disease with heart failure: Secondary | ICD-10-CM | POA: Diagnosis not present

## 2024-12-22 DIAGNOSIS — I5043 Acute on chronic combined systolic (congestive) and diastolic (congestive) heart failure: Secondary | ICD-10-CM | POA: Diagnosis not present

## 2024-12-22 DIAGNOSIS — R0902 Hypoxemia: Secondary | ICD-10-CM | POA: Diagnosis not present

## 2024-12-22 DIAGNOSIS — I469 Cardiac arrest, cause unspecified: Secondary | ICD-10-CM | POA: Diagnosis not present

## 2024-12-22 DIAGNOSIS — D72829 Elevated white blood cell count, unspecified: Secondary | ICD-10-CM

## 2024-12-22 DIAGNOSIS — I2510 Atherosclerotic heart disease of native coronary artery without angina pectoris: Secondary | ICD-10-CM | POA: Diagnosis not present

## 2024-12-22 DIAGNOSIS — E440 Moderate protein-calorie malnutrition: Secondary | ICD-10-CM | POA: Insufficient documentation

## 2024-12-22 LAB — GLUCOSE, CAPILLARY
Glucose-Capillary: 83 mg/dL (ref 70–99)
Glucose-Capillary: 91 mg/dL (ref 70–99)
Glucose-Capillary: 91 mg/dL (ref 70–99)
Glucose-Capillary: 96 mg/dL (ref 70–99)
Glucose-Capillary: 49 mg/dL — ABNORMAL LOW (ref 70–99)
Glucose-Capillary: 61 mg/dL — ABNORMAL LOW (ref 70–99)
Glucose-Capillary: 116 mg/dL — ABNORMAL HIGH (ref 70–99)
Glucose-Capillary: 109 mg/dL — ABNORMAL HIGH (ref 70–99)
Glucose-Capillary: 37 mg/dL — CL (ref 70–99)
Glucose-Capillary: 63 mg/dL — ABNORMAL LOW (ref 70–99)
Glucose-Capillary: 87 mg/dL (ref 70–99)
Glucose-Capillary: 118 mg/dL — ABNORMAL HIGH (ref 70–99)
Glucose-Capillary: 105 mg/dL — ABNORMAL HIGH (ref 70–99)

## 2024-12-22 LAB — CBC
HCT: 29.8 % — ABNORMAL LOW (ref 39.0–52.0)
Hemoglobin: 8.6 g/dL — ABNORMAL LOW (ref 13.0–17.0)
MCH: 22.5 pg — ABNORMAL LOW (ref 26.0–34.0)
MCHC: 28.9 g/dL — ABNORMAL LOW (ref 30.0–36.0)
MCV: 78 fL — ABNORMAL LOW (ref 80.0–100.0)
Platelets: 350 K/uL (ref 150–400)
RBC: 3.82 MIL/uL — ABNORMAL LOW (ref 4.22–5.81)
RDW: 25.7 % — ABNORMAL HIGH (ref 11.5–15.5)
WBC: 17.9 K/uL — ABNORMAL HIGH (ref 4.0–10.5)
nRBC: 0.3 % — ABNORMAL HIGH (ref 0.0–0.2)

## 2024-12-22 LAB — RENAL FUNCTION PANEL
Albumin: 3.3 g/dL — ABNORMAL LOW (ref 3.5–5.0)
Albumin: 3.3 g/dL — ABNORMAL LOW (ref 3.5–5.0)
Anion gap: 12 (ref 5–15)
Anion gap: 10 (ref 5–15)
BUN: 30 mg/dL — ABNORMAL HIGH (ref 6–20)
BUN: 23 mg/dL — ABNORMAL HIGH (ref 6–20)
CO2: 25 mmol/L (ref 22–32)
CO2: 26 mmol/L (ref 22–32)
Calcium: 8.5 mg/dL — ABNORMAL LOW (ref 8.9–10.3)
Calcium: 8.4 mg/dL — ABNORMAL LOW (ref 8.9–10.3)
Chloride: 94 mmol/L — ABNORMAL LOW (ref 98–111)
Chloride: 97 mmol/L — ABNORMAL LOW (ref 98–111)
Creatinine, Ser: 1.42 mg/dL — ABNORMAL HIGH (ref 0.61–1.24)
Creatinine, Ser: 1.21 mg/dL (ref 0.61–1.24)
GFR, Estimated: 57 mL/min — ABNORMAL LOW
GFR, Estimated: >60 mL/min
Glucose, Bld: 78 mg/dL (ref 70–99)
Glucose, Bld: 75 mg/dL (ref 70–99)
Phosphorus: 3.3 mg/dL (ref 2.5–4.6)
Phosphorus: 3.2 mg/dL (ref 2.5–4.6)
Potassium: 4.1 mmol/L (ref 3.5–5.1)
Potassium: 4.4 mmol/L (ref 3.5–5.1)
Sodium: 131 mmol/L — ABNORMAL LOW (ref 135–145)
Sodium: 133 mmol/L — ABNORMAL LOW (ref 135–145)

## 2024-12-22 LAB — TRIGLYCERIDES: Triglycerides: 75 mg/dL

## 2024-12-22 LAB — COOXEMETRY PANEL
Carboxyhemoglobin: 2.2 % — ABNORMAL HIGH (ref 0.5–1.5)
Methemoglobin: <0.7 % (ref 0.0–1.5)
O2 Saturation: 64.1 %
Total hemoglobin: 8.7 g/dL — ABNORMAL LOW (ref 12.0–16.0)

## 2024-12-22 LAB — HEPARIN LEVEL (UNFRACTIONATED): Heparin Unfractionated: >1.1 [IU]/mL — ABNORMAL HIGH (ref 0.30–0.70)

## 2024-12-22 LAB — MRSA NEXT GEN BY PCR, NASAL: MRSA by PCR Next Gen: NOT DETECTED

## 2024-12-22 LAB — MAGNESIUM: Magnesium: 2.6 mg/dL — ABNORMAL HIGH (ref 1.7–2.4)

## 2024-12-22 LAB — APTT
aPTT: 96 s — ABNORMAL HIGH (ref 24–36)
aPTT: 89 s — ABNORMAL HIGH (ref 24–36)

## 2024-12-22 LAB — CG4 I-STAT (LACTIC ACID): Lactic Acid, Venous: 1.1 mmol/L (ref 0.5–1.9)

## 2024-12-22 MED ORDER — PROSOURCE TF20 ENFIT COMPATIBL EN LIQD
60.0000 mL | Freq: Three times a day (TID) | ENTERAL | Status: DC
  Administered 2024-12-22 (×3): 60 mL
  Filled 2024-12-22 (×4): qty 60

## 2024-12-22 MED ORDER — POLYETHYLENE GLYCOL 3350 17 G PO PACK
17.0000 g | PACK | Freq: Two times a day (BID) | ORAL | Status: DC
  Administered 2024-12-22 (×2): 17 g
  Filled 2024-12-22 (×3): qty 1

## 2024-12-22 MED ORDER — DEXTROSE 50 % IV SOLN
12.5000 g | INTRAVENOUS | Status: AC
  Administered 2024-12-22: 12.5 g via INTRAVENOUS

## 2024-12-22 MED ORDER — SODIUM CHLORIDE 3 % IN NEBU
4.0000 mL | INHALATION_SOLUTION | Freq: Four times a day (QID) | RESPIRATORY_TRACT | Status: DC
  Administered 2024-12-22 – 2024-12-23 (×4): 4 mL via RESPIRATORY_TRACT
  Filled 2024-12-22 (×7): qty 4

## 2024-12-22 MED ORDER — DEXTROSE 50 % IV SOLN
INTRAVENOUS | Status: AC
  Filled 2024-12-22: qty 50

## 2024-12-22 MED ORDER — AMIODARONE HCL 200 MG PO TABS
400.0000 mg | ORAL_TABLET | Freq: Every day | ORAL | Status: DC
  Administered 2024-12-22 – 2024-12-23 (×2): 400 mg
  Filled 2024-12-22 (×2): qty 2

## 2024-12-22 MED ORDER — PIPERACILLIN-TAZOBACTAM 3.375 G IVPB 30 MIN
3.3750 g | Freq: Four times a day (QID) | INTRAVENOUS | Status: DC
  Administered 2024-12-22 – 2024-12-23 (×3): 3.375 g via INTRAVENOUS
  Filled 2024-12-22 (×3): qty 50

## 2024-12-22 MED ORDER — RENA-VITE PO TABS
1.0000 | ORAL_TABLET | Freq: Every day | ORAL | Status: DC
  Administered 2024-12-22: 1
  Filled 2024-12-22: qty 1

## 2024-12-22 MED ORDER — LEVETIRACETAM (KEPPRA) 500 MG/5 ML ADULT IV PUSH
3000.0000 mg | INTRAVENOUS | Status: AC
  Administered 2024-12-22: 3000 mg via INTRAVENOUS
  Filled 2024-12-22: qty 30

## 2024-12-22 MED ORDER — VITAL 1.5 CAL PO LIQD: 1000.0000 mL | ORAL | Status: DC

## 2024-12-22 MED ORDER — DOCUSATE SODIUM 50 MG/5ML PO LIQD
100.0000 mg | Freq: Every day | ORAL | Status: DC
  Administered 2024-12-22: 100 mg
  Filled 2024-12-22 (×2): qty 10

## 2024-12-22 MED ORDER — PROSOURCE PLUS PO LIQD: 30.0000 mL | Freq: Three times a day (TID) | ORAL | Status: DC

## 2024-12-22 MED ORDER — LEVETIRACETAM (KEPPRA) 500 MG/5 ML ADULT IV PUSH
1000.0000 mg | Freq: Two times a day (BID) | INTRAVENOUS | Status: DC
  Administered 2024-12-22 – 2024-12-23 (×2): 1000 mg via INTRAVENOUS
  Filled 2024-12-22 (×2): qty 10

## 2024-12-22 MED ORDER — VITAL 1.5 CAL PO LIQD
1000.0000 mL | ORAL | Status: DC
  Administered 2024-12-22: 1000 mL

## 2024-12-22 MED ORDER — THIAMINE MONONITRATE 100 MG PO TABS
100.0000 mg | ORAL_TABLET | Freq: Every day | ORAL | Status: DC
  Administered 2024-12-22: 100 mg
  Filled 2024-12-22 (×2): qty 1

## 2024-12-22 MED ORDER — EZETIMIBE 10 MG PO TABS
10.0000 mg | ORAL_TABLET | Freq: Every day | ORAL | Status: DC
  Administered 2024-12-22 – 2024-12-23 (×2): 10 mg
  Filled 2024-12-22 (×2): qty 1

## 2024-12-22 MED ORDER — LEVETIRACETAM (KEPPRA) 500 MG/5 ML ADULT IV PUSH: 1000.0000 mg | Freq: Two times a day (BID) | INTRAVENOUS | Status: DC

## 2024-12-22 MED ORDER — CLOPIDOGREL BISULFATE 75 MG PO TABS
75.0000 mg | ORAL_TABLET | Freq: Every day | ORAL | Status: DC
  Administered 2024-12-22 – 2024-12-23 (×2): 75 mg
  Filled 2024-12-22 (×2): qty 1

## 2024-12-22 NOTE — Progress Notes (Addendum)
 error

## 2024-12-22 NOTE — Progress Notes (Signed)
 "  NAME:  Ryan [name], MRN:  [id number], DOB:  [birth year], LOS: 4 ADMISSION DATE:  12/18/2024, CONSULTATION DATE:  12/20/24 REFERRING MD: [facility], CHIEF COMPLAINT: Cardiac arrest  History of Present Illness:  Ryan [name] is a 58 year old male with a past medical history significant for combined CHF, emphysema, chronic respiratory failure, paroxysmal A-fib, HTN, HLD, prior CABG with CAD, PAD, and carotid artery disease who initially presented to the ED at [facility] 1/15 for worsening shortness of breath and edema.  Admission workup consistent with CHF exacerbation for which patient was admitted to [facility]  Late afternoon 1/17 patient suffered in-hospital cardiac arrest with estimated downtime of 16 minutes.  PCCM consulted for further assistance postarrest  Pertinent  Medical History  CHF, emphysema, chronic respiratory failure, paroxysmal A-fib, HTN, HLD, prior CABG with CAD, PAD, and carotid artery disease   Significant Hospital Events: Including procedures, antibiotic start and stop dates in addition to other pertinent events   1/15 presented with increased work of breathing and edema admitted for acute exacerbation CHF 1/17 suffered an hospital bradycardic to PEA cardiac arrest, estimated downtime 16 minutes 1/18 started CRRT for volume removal after unsuccessful diuretic challenge  Interim History / Subjective:  cvEEG in place this AM with plan for sedation wean to assess for seizure activity in light of myoclonic-like activity clinically observed y/day when sedation weaned.  Objective    Blood pressure (!) 104/51, pulse 68, temperature 99.1 F (37.3 C), resp. rate (!) 22, height 5' 9 (1.753 m), weight 77.9 kg, SpO2 100%. CVP:  [16 mmHg-23 mmHg] 19 mmHg  Vent Mode: PRVC FiO2 (%):  [40 %] 40 % Set Rate:  [22 bmp] 22 bmp Vt Set:  [560 mL] 560 mL PEEP:  [5 cmH20] 5 cmH20 Plateau Pressure:  [20 cmH20-27 cmH20] 23 cmH20   Intake/Output Summary (Last 24 hours) at 12/22/2024 9061 Last data  filed at 12/22/2024 0910 Gross per 24 hour  Intake 1914.59 ml  Output 6620.7 ml  Net -4706.11 ml   Filed Weights   12/20/24 0440 12/21/24 0500 12/22/24 0500  Weight: 105 kg 90.7 kg 77.9 kg    Examination: General: Acute on chronic ill-appearing middle-age male lying in bed on mechanical ventilation HEENT: ETT, MM pink/moist, PERRL,  Neuro: Unresponsive, sedated, in this setting neuro exam to be interpreted cautiously but cough+, pupils 2 mm sluggishly reactive bilaterally (performed on propofol  @ 30 and fentanyl  infusion). CV: s1s2 regular rate and rhythm, no murmur, rubs, or gallops,  PULM: Mechanical breath sounds b/l GI: soft albeit moderately distended, bowel sounds active in all 4 quadrants, non-tender Extremities: warm/dry, 2+ pitting edema up to level of sacrum. Minimal to no UE edema.  Skin: no rashes or lesions   Resolved problem list   Assessment and Plan  In-hospital cardiac arrest PEA arrest. Estimated duration of CPR: 16 minutes. NCHCT without acute abnormality post-arrest. P: - cvEEG in place. Wean sedation while monitoring clinically for e/o myoclonic seizure activity. - MRI and neuro c/s at 72+ hrs post-arrest (any time from 01-Jan-2025 ~ 7 pm onwards) if no recovery of mental status prior to then - Prevent fever, hypoxemia in post-arrest setting. - Treat underlying cause of arrest (severe biventricular failure, esp RV failure, in setting of massive volume overload).  Acute on chronic combined systolic congestive heart failure - Echo 1/17 with EF 35 to 40%, no observed apical thrombus on this echo there was concern for 1 day prior, RV function severely reduced CAD s/p CABG P: Primary management per heart  failure CRRT for extensive ultrafiltration / volume removal. Maximize UF rate as tolerated until CVP ~ 12, at which point will scale back rate of UF  Acute on chronic hypoxic respiratory failure in the setting of cardiac arrest History of COPD -Intubated during  arrest  P: Continue ventilator support with lung protective strategies  Wean PEEP and FiO2 for sats greater than 90%. Head of bed elevated 30 degrees. Plateau pressures less than 30 cm H20.  Follow intermittent chest x-ray and ABG.   SAT/SBT when able Ensure adequate pulmonary hygiene  Follow cultures  VAP bundle in place  PAD protocol  Leukocytosis, Temp 99 No fever, no localizing sign of infection, no growth in cultures from 1/18 We are pursuing a goal of fever prevention for at least 48-72 hours post-arrest CTM closely. Leukocytosis most likely reactive as it is coming down today without antimicrobials  Atrial fibs/atrial fibrillation -S/p flutter ablation 12/25 P: Optimize electrolytes Continuous telemetry Heparin  drip in place of Eliquis  for now  PAD with carotid and femoral disease P: On Eliquis  at home. Heparin  drip here for now.  Type 2 diabetes P: Continue SSI CBG checks every 4  Hyponatremia P: Trend. Improving with UF/CRRT.  Hypertension Hyperlipidemia P: Hold home medications postarrest   Labs   CBC: Recent Labs  Lab 12/20/24 0229 12/20/24 1844 12/20/24 1940 12/20/24 2015 12/21/24 0001 12/21/24 0348 12/21/24 0434 12/21/24 0657 12/22/24 0420  WBC 10.3 13.6*  --  22.6*  --   --  23.7*  --  17.9*  HGB 8.3* 6.5*   < > 8.0* 10.5* 10.9* 8.6* 10.9* 8.6*  HCT 28.3* 26.9*   < > 27.8* 31.0* 32.0* 29.4* 32.0* 29.8*  MCV 75.9* 92.8  --  78.1*  --   --  77.2*  --  78.0*  PLT 228 224  --  274  --   --  326  --  350   < > = values in this interval not displayed.    Basic Metabolic Panel: Recent Labs  Lab 12/18/24 1515 12/19/24 0259 12/19/24 0301 12/19/24 1434 12/20/24 0229 12/20/24 0230 12/20/24 1844 12/20/24 2015 12/21/24 0001 12/21/24 0142 12/21/24 0348 12/21/24 0434 12/21/24 0657 12/21/24 1600 12/22/24 0420  NA  --  131* 132*   < > 127* 127*   < > 129*   < > 126* 127* 126* 126* 129* 131*  K  --  2.9* 2.9*   < > 3.7 3.7   < > 3.4*    < > 4.1 4.1 4.6 4.6 4.7 4.1  CL  --  87* 87*   < > 86* 86*   < > 88*  --  86*  --  86*  --  90* 94*  CO2  --  33* 33*   < > 30 30   < > 26  --  24  --  24  --  25 25  GLUCOSE  --  225* 223*   < > 127* 126*   < > 77  --  120*  --  131*  --  111* 78  BUN  --  48* 48*   < > 43* 44*   < > 43*  --  47*  --  47*  --  39* 30*  CREATININE  --  1.51* 1.50*   < > 1.63* 1.61*   < > 1.82*  --  1.96*  --  2.00*  --  1.77* 1.42*  CALCIUM   --  8.4* 8.5*   < >  8.4* 8.5*   < > 8.7*  --  8.7*  --  8.9  --  8.6* 8.5*  MG 2.5* 2.7*  --   --  2.6*  --   --   --   --   --   --  3.5*  --   --  2.6*  PHOS  --   --  4.3  --   --  4.2  --   --   --   --   --  5.3*  --  4.5 3.3   < > = values in this interval not displayed.   GFR: Estimated Creatinine Clearance: 56.7 mL/min (A) (by C-G formula based on SCr of 1.42 mg/dL (H)). Recent Labs  Lab 12/20/24 1844 12/20/24 2015 12/20/24 2024 12/21/24 0434 12/21/24 0522 12/21/24 1202 12/21/24 1722 12/21/24 2335 12/22/24 0420 12/22/24 0510  WBC 13.6* 22.6*  --  23.7*  --   --   --   --  17.9*  --   LATICACIDVEN  --   --    < >  --    < > 2.7* 1.5 1.3  --  1.1   < > = values in this interval not displayed.    Liver Function Tests: Recent Labs  Lab 12/19/24 0259 12/19/24 0301 12/20/24 1844 12/20/24 2015 12/21/24 0434 12/21/24 1600 12/22/24 0420  AST 26  --  29 28  --   --   --   ALT 15  --  10 14  --   --   --   ALKPHOS 379*  --  220* 278*  --   --   --   BILITOT 1.2  --  1.0 1.5*  --   --   --   PROT 7.2  --  5.5* 6.9  --   --   --   ALBUMIN  3.4*   < > 2.6* 3.3* 3.4* 3.5 3.3*   < > = values in this interval not displayed.   No results for input(s): LIPASE, AMYLASE in the last 168 hours. No results for input(s): AMMONIA in the last 168 hours.  ABG    Component Value Date/Time   PHART 7.406 12/21/2024 0657   PCO2ART 35.5 12/21/2024 0657   PO2ART 95 12/21/2024 0657   HCO3 22.5 12/21/2024 0657   TCO2 24 12/21/2024 0657   ACIDBASEDEF 2.0  12/21/2024 0657   O2SAT 64.1 12/22/2024 0420     Coagulation Profile: Recent Labs  Lab 12/20/24 1844  INR 3.6*    Cardiac Enzymes: No results for input(s): CKTOTAL, CKMB, CKMBINDEX, TROPONINI in the last 168 hours.  HbA1C: Hgb A1c MFr Bld  Date/Time Value Ref Range Status  07/18/2024 06:18 AM 12.3 (H) 4.8 - 5.6 % Final    Comment:    (NOTE) Diagnosis of Diabetes The following HbA1c ranges recommended by the American Diabetes Association (ADA) may be used as an aid in the diagnosis of diabetes mellitus.  Hemoglobin             Suggested A1C NGSP%              Diagnosis  <5.7                   Non Diabetic  5.7-6.4                Pre-Diabetic  >6.4                   Diabetic  <7.0  Glycemic control for                       adults with diabetes.    03/01/2024 10:37 AM 8.9 (H) 4.8 - 5.6 % Final    Comment:    (NOTE) Pre diabetes:          5.7%-6.4%  Diabetes:              >6.4%  Glycemic control for   <7.0% adults with diabetes     CBG: Recent Labs  Lab 12/21/24 2333 12/22/24 0412 12/22/24 0819 12/22/24 0822 12/22/24 0842  GLUCAP 91 83 49* 61* 116*    Review of Systems:   Unable to assess   Past Medical History:  He,  has a past medical history of Acute myocardial infarction of other anterior wall, subsequent episode of care (12/12/2011), Anal fissure, Arthritis, Atherosclerosis of native artery of both lower extremities with intermittent claudication (04/01/2018), Bruises easily, CAD (coronary artery disease), Cardiomyopathy secondary (12/26/2011), Chronic systolic CHF (congestive heart failure) (HCC), Chronic total occlusion of artery of the extremities (02/26/2012), Claudication (12/26/2011), Coronary atherosclerosis of native coronary artery, Diabetes mellitus without complication (HCC), Essential hypertension (10/23/2016), GERD (gastroesophageal reflux disease), History of blood transfusion, History of kidney stones,  Hypertension, Ischemic cardiomyopathy, MI (myocardial infarction) (HCC), Mixed hyperlipidemia, NSTEMI (non-ST elevation myocardial infarction) (HCC) (03/26/2023), Numbness and tingling of right arm (03/31/2013), Occlusion and stenosis of carotid artery without mention of cerebral infarction (08/26/2012), PAD (peripheral artery disease), Paroxysmal atrial flutter (HCC) (07/20/2024), Peripheral vascular disease, unspecified (12/16/2012), PVD (peripheral vascular disease) (03/31/2013), Rectal polyp, ST elevation myocardial infarction involving left anterior descending (LAD) coronary artery (HCC) (07/31/2017), Subclavian steal syndrome (08/11/2013), Syncope (07/20/2024), Uncontrolled diabetes mellitus (10/23/2016), Unstable angina (HCC), and Wears partial dentures.   Surgical History:   Past Surgical History:  Procedure Laterality Date   A-FLUTTER ABLATION N/A 05/05/2024   Procedure: A-FLUTTER ABLATION;  Surgeon: [name], MD;  Location: [facility] INVASIVE CV LAB;  Service: Cardiovascular;  Laterality: N/A;   ABDOMINAL AORTOGRAM W/LOWER EXTREMITY N/A 06/26/2017   Procedure: Abdominal Aortogram w/Lower Extremity;  Surgeon: Serene Gaile ORN, MD;  Location: [facility] INVASIVE CV LAB;  Service: Cardiovascular;  Laterality: N/A;   ABDOMINAL AORTOGRAM W/LOWER EXTREMITY N/A 06/28/2021   Procedure: ABDOMINAL AORTOGRAM W/LOWER EXTREMITY;  Surgeon: Serene Gaile ORN, MD;  Location: [facility] INVASIVE CV LAB;  Service: Cardiovascular;  Laterality: N/A;   ABDOMINAL AORTOGRAM W/LOWER EXTREMITY N/A 01/02/2023   Procedure: ABDOMINAL AORTOGRAM W/LOWER EXTREMITY;  Surgeon: Serene Gaile ORN, MD;  Location: [facility] INVASIVE CV LAB;  Service: Cardiovascular;  Laterality: N/A;   ANAL FISSURECTOMY     AORTA - BILATERAL FEMORAL ARTERY BYPASS GRAFT N/A 01/16/2013   Procedure: AORTA BIFEMORAL BYPASS GRAFT;  Surgeon: Gaile ORN Serene, MD;  Location: [facility] OR;  Service: Vascular;  Laterality: N/A;   APPENDECTOMY     ATRIAL FIBRILLATION ABLATION N/A 11/12/2024    Procedure: ATRIAL FIBRILLATION ABLATION;  Surgeon: [name], MD;  Location: [facility] INVASIVE CV LAB;  Service: Cardiovascular;  Laterality: N/A;   CARDIAC CATHETERIZATION N/A 10/23/2016   Procedure: Left Heart Cath and Coronary Angiography;  Surgeon: Lonni JONETTA Cash, MD;  Location: [facility] INVASIVE CV LAB;  Service: Cardiovascular;  Laterality: N/A;   CARDIAC CATHETERIZATION N/A 10/23/2016   Procedure: Coronary Balloon Angioplasty;  Surgeon: Lonni JONETTA Cash, MD;  Location: [facility] INVASIVE CV LAB;  Service: Cardiovascular;  Laterality: N/A;   CARDIOVERSION N/A 03/31/2024   Procedure: CARDIOVERSION;  Surgeon: Zenaida Morene PARAS, MD;  Location: [facility] INVASIVE CV LAB;  Service: Cardiovascular;  Laterality: N/A;   CAROTID-SUBCLAVIAN BYPASS GRAFT Left 04/03/2013   Procedure: BYPASS GRAFT CAROTID-SUBCLAVIAN;  Surgeon: Gaile LELON New, MD;  Location: [facility] OR;  Service: Vascular;  Laterality: Left;   CIRCUMCISION N/A 12/15/2015   Procedure: CIRCUMCISION ADULT;  Surgeon: Belvie LITTIE Clara, MD;  Location: [facility] ORS;  Service: Urology;  Laterality: N/A;   COLONOSCOPY     CORONARY ANGIOPLASTY     stent placed Dec 12, 2011 and 2018   CORONARY ARTERY BYPASS GRAFT N/A 05/05/2020   Procedure: CORONARY ARTERY BYPASS GRAFTING (CABG) x Three, Using Left internal Mammary Artery and Left Leg greater saphenous vein harvested endoscopically;  Surgeon: Army Dallas NOVAK, MD;  Location: [facility] OR;  Service: Open Heart Surgery;  Laterality: N/A;   CORONARY BALLOON ANGIOPLASTY N/A 05/23/2019   Procedure: CORONARY BALLOON ANGIOPLASTY;  Surgeon: Verlin Lonni BIRCH, MD;  Location: [facility] INVASIVE CV LAB;  Service: Cardiovascular;  Laterality: N/A;   CORONARY PRESSURE/FFR STUDY N/A 04/22/2020   Procedure: INTRAVASCULAR PRESSURE WIRE/FFR STUDY;  Surgeon: Verlin Lonni BIRCH, MD;  Location: [facility] INVASIVE CV LAB;  Service: Cardiovascular;  Laterality: N/A;   DIAGNOSTIC LAPAROSCOPY     ENDARTERECTOMY FEMORAL Right 03/13/2018   Procedure:  FEMORAL ENDARTERECTOMY RIGHT;  Surgeon: New Gaile LELON, MD;  Location: [facility] OR;  Service: Vascular;  Laterality: Right;   ENDARTERECTOMY FEMORAL Left 01/26/2023   Procedure: REDO LEFT COMMON FEMORAL ENDARTERECTOMY WITH VPA;  Surgeon: New Gaile LELON, MD;  Location: [facility] OR;  Service: Vascular;  Laterality: Left;   FEMORAL-POPLITEAL BYPASS GRAFT Right 03/13/2018   Procedure: FEMORAL-POPLITEAL ARTERY BYPASS WITH NON-REVERSE VEIN RIGHT;  Surgeon: New Gaile LELON, MD;  Location: [facility] OR;  Service: Vascular;  Laterality: Right;   GROIN DISSECTION Right 03/13/2018   Procedure: RE-DO COMMON FEMORAL ARTERY EXPOSURE;  Surgeon: New Gaile LELON, MD;  Location: [facility] OR;  Service: Vascular;  Laterality: Right;   I & D EXTREMITY Left 04/18/2013   Procedure: debridement of left neck lymphocele;  Surgeon: Gaile LELON New, MD;  Location: [facility] OR;  Service: Vascular;  Laterality: Left;  I and D of left neck   I & D EXTREMITY Right 11/21/2014   Procedure: IRRIGATION AND DEBRIDEMENT RIGHT HAND;  Surgeon: Elsie Mussel, MD;  Location: [facility] OR;  Service: Orthopedics;  Laterality: Right;   LEFT HEART CATH AND CORONARY ANGIOGRAPHY N/A 05/23/2019   Procedure: LEFT HEART CATH AND CORONARY ANGIOGRAPHY;  Surgeon: Verlin Lonni BIRCH, MD;  Location: [facility] INVASIVE CV LAB;  Service: Cardiovascular;  Laterality: N/A;   LEFT HEART CATH AND CORONARY ANGIOGRAPHY N/A 04/22/2020   Procedure: LEFT HEART CATH AND CORONARY ANGIOGRAPHY;  Surgeon: Verlin Lonni BIRCH, MD;  Location: [facility] INVASIVE CV LAB;  Service: Cardiovascular;  Laterality: N/A;   LEFT HEART CATHETERIZATION WITH CORONARY ANGIOGRAM N/A 12/12/2011   Procedure: LEFT HEART CATHETERIZATION WITH CORONARY ANGIOGRAM;  Surgeon: Lonni BIRCH Verlin, MD;  Location: [facility] CATH LAB;  Service: Cardiovascular;  Laterality: N/A;   LOOP RECORDER INSERTION N/A 05/05/2024   Procedure: LOOP RECORDER INSERTION;  Surgeon: [name], MD;  Location: [facility] INVASIVE CV LAB;  Service: Cardiovascular;   Laterality: N/A;   LOWER EXTREMITY ANGIOGRAM N/A 01/31/2012   Procedure: LOWER EXTREMITY ANGIOGRAM;  Surgeon: Lonni BIRCH Verlin, MD;  Location: [facility] CATH LAB;  Service: Cardiovascular;  Laterality: N/A;   LOWER EXTREMITY ANGIOGRAPHY N/A 04/23/2018   Procedure: LOWER EXTREMITY ANGIOGRAPHY;  Surgeon: New Gaile LELON, MD;  [facility] INVASIVE CV LAB;  Service: Cardiovascular;  Laterality: N/A;  PATCH ANGIOPLASTY Left 01/26/2023   Procedure: PATCH ANGIOPLASTY USING HEMASHIELD PLATINUM FINESSE;  Surgeon: Serene Gaile ORN, MD;  Location: [facility] OR;  Service: Vascular;  Laterality: Left;   PERCUTANEOUS CORONARY STENT INTERVENTION (PCI-S) Right 12/12/2011   Procedure: PERCUTANEOUS CORONARY STENT INTERVENTION (PCI-S);  Surgeon: Lonni JONETTA Cash, MD;  Location: [facility] CATH LAB;  Service: Cardiovascular;  Laterality: Right;   PERIPHERAL VASCULAR BALLOON ANGIOPLASTY Right 06/28/2021   Procedure: PERIPHERAL VASCULAR BALLOON ANGIOPLASTY;  Surgeon: Serene Gaile ORN, MD;  [facility]: [facility] INVASIVE CV LAB;  Service: Cardiovascular;  Laterality: Right;  common femoral (graft)   PERIPHERAL VASCULAR BALLOON ANGIOPLASTY  01/02/2023   Procedure: PERIPHERAL VASCULAR BALLOON ANGIOPLASTY;  Surgeon: Serene Gaile ORN, MD;  Location: [facility] INVASIVE CV LAB;  Service: Cardiovascular;;   PERIPHERAL VASCULAR INTERVENTION Left 04/23/2018   Procedure: PERIPHERAL VASCULAR INTERVENTION;  Surgeon: Serene Gaile ORN, MD;  Location: [facility] INVASIVE CV LAB;  Service: Cardiovascular;  Laterality: Left;  superficial femoral   REPAIR EXTENSOR TENDON Right 11/21/2014   Procedure: WITH REPAIR/RECONSTRUCTION OF EXTENSOR TENDONS AS NEEDED;  Surgeon: Elsie Mussel, MD;  Location: [facility] OR;  Service: Orthopedics;  Laterality: Right;   TEE WITHOUT CARDIOVERSION N/A 05/05/2020   Procedure: TRANSESOPHAGEAL ECHOCARDIOGRAM (TEE);  Surgeon: Army Dallas NOVAK, MD;  Location: [facility] OR;  Service: Open Heart Surgery;  Laterality: N/A;     Social History:   reports that he  has quit smoking. His smoking use included cigarettes. He has a 6.3 pack-year smoking history. He has never used smokeless tobacco. He reports current alcohol  use of about 2.0 standard drinks of alcohol  per week. He reports that he does not use drugs.   Family History:  His family history includes COPD in his mother; Cancer in his father and maternal uncle; Diabetes in his mother.   Allergies Allergies[1]   Home Medications  Prior to Admission medications  Medication Sig Start Date End Date Taking? Authorizing Provider  albuterol  (VENTOLIN  HFA) 108 (90 Base) MCG/ACT inhaler Inhale 1-2 puffs into the lungs every 6 (six) hours as needed for wheezing or shortness of breath.   Yes [provider]  cetirizine (ZYRTEC) 10 MG tablet Take 10 mg by mouth daily as needed for allergies. 06/02/21  Yes [provider]  clopidogrel  (PLAVIX ) 75 MG tablet TAKE 1 TABLET BY MOUTH EVERY DAY 09/01/24  Yes Cash Lonni JONETTA, MD  diazepam  (VALIUM ) 10 MG tablet Take 10 mg by mouth at bedtime. 03/10/21  Yes [provider]  ELIQUIS  5 MG TABS tablet TAKE 1 TABLET BY MOUTH 2 TIMES A DAY 09/29/24  Yes [name], MD  ezetimibe  (ZETIA ) 10 MG tablet TAKE 1 TABLET BY MOUTH EACH DAY 03/07/24  Yes Weaver, Scott T, PA-C  Furosemide  (FUROSCIX ) 80 MG/10ML CTKT Inject 80 mg into the skin once daily as directed. 12/15/24  Yes Milford, [name], FNP  gabapentin  (NEURONTIN ) 300 MG capsule Take 300 mg by mouth 3 (three) times daily. 03/25/20  Yes [provider]  icosapent  Ethyl (VASCEPA ) 1 g capsule TAKE 2 CAPSULES BY MOUTH TWICE DAILY 05/30/24  Yes Lelon Hamilton T, PA-C  insulin  glargine (LANTUS ) 100 UNIT/ML Solostar Pen Inject 50 Units into the skin 2 (two) times daily.   Yes [provider]  insulin  lispro (HUMALOG ) 100 UNIT/ML injection Inject 0-15 Units into the skin 3 (three) times daily as needed for high blood sugar.   Yes [provider]  methocarbamol  (ROBAXIN ) 500 MG  tablet Take 500 mg by mouth every 6 (six) hours as needed  for muscle spasms. 11/05/24  Yes [provider]  nitroGLYCERIN  (NITROSTAT ) 0.4 MG SL tablet Place 0.4 mg under the tongue every 5 (five) minutes as needed for chest pain.   Yes [provider]  oxyCODONE -acetaminophen  (PERCOCET) 10-325 MG tablet Take 1 tablet by mouth 4 (four) times daily.   Yes [provider]  pantoprazole  (PROTONIX ) 40 MG tablet Take 40 mg by mouth daily. 03/07/24  Yes [provider]  potassium chloride  SA (KLOR-CON  M) 20 MEQ tablet Take 3 tablets (60 mEq total) by mouth 2 (two) times daily. Take an extra 2 tablets (40 meq total) when you take Metolazone . 12/01/24  Yes Milford, [name], FNP  pseudoephedrine (SUDAFED) 30 MG tablet Take 30 mg by mouth every 4 (four) hours as needed for congestion.   Yes [provider]  ramelteon  (ROZEREM ) 8 MG tablet TAKE 1 TABLET (8 MG TOTAL) BY MOUTH AT BEDTIME. 12/01/24  Yes Olalere, Adewale A, MD  REPATHA  SURECLICK 140 MG/ML SOAJ INJECT 140 MG INTO THE SKIN EVERY 14 DAYS AS DIRECTED 05/30/24  Yes [name], MD  Tiotropium Bromide-Olodaterol (STIOLTO RESPIMAT ) 2.5-2.5 MCG/ACT AERS INHALE 2 PUFFS INTO THE LUNGS DAILY. 12/01/24  Yes Olalere, Adewale A, MD  torsemide  (DEMADEX ) 20 MG tablet Take 4 tablets (80 mg total) by mouth 2 (two) times daily. 11/28/24  Yes Clegg, [name], NP  traZODone  (DESYREL ) 100 MG tablet Take 100 mg by mouth at bedtime.   Yes [provider]  TRINTELLIX  20 MG TABS tablet Take 20 mg by mouth daily in the afternoon. 12/29/22  Yes [provider]  ACCU-CHEK GUIDE TEST test strip  12/01/24   [provider]  ADVOCATE INSULIN  SYRINGE 31G X 5/16 1 ML MISC  12/01/24   [provider]  GNP PEN NEEDLES 32G X 4 MM MISC  12/01/24   [provider]  metolazone  (ZAROXOLYN ) 5 MG tablet Take 1 tablet (5 mg total) by mouth daily. Patient not taking: Reported on 12/18/2024 12/15/24   Glena [name], FNP  OXYGEN Inhale 2 L into the lungs as needed (excessive movement).    [provider]     Critical care time:   CRITICAL CARE Performed by: Lamar PARAS Keimya Briddell  Total critical care time: 40 minutes  Critical care time was exclusive of separately billable procedures and treating other patients.  Critical care was necessary to treat or prevent imminent or life-threatening deterioration.  Critical care was time spent personally by me on the following activities: development of treatment plan with patient and/or surrogate as well as nursing, discussions with consultants, evaluation of patient's response to treatment, examination of patient, obtaining history from patient or surrogate, ordering and performing treatments and interventions, ordering and review of laboratory studies, ordering and review of radiographic studies, pulse oximetry and re-evaluation of patient's condition.  Whitney D. Harris, NP-C [facility] Personal contact information can be found on Amion  If no contact or response made please call [phone] 12/22/2024, 9:38 AM             [1]  Allergies Allergen Reactions   Metformin Anaphylaxis    Water  blisters between fingers   Hydrocodone  Hives and Nausea And Vomiting   "

## 2024-12-22 NOTE — Consult Note (Signed)
 Neurology Consultation Reason for Consult: Seizure Referring Physician: Dr. Lamar stretch  CC: Postcardiac arrest seizure  History is obtained from: Chart review as patient is intubated and sedated, no family at bedside.  HPI: Ryan [name] is a 59 y.o. male with past medical history of hypertension, lipidemia, diabetes, chronic kidney disease stage IIIa, LV thrombus, coronary artery disease status post CABG, congestive heart failure, chronic respiratory failure with hypoxia who initially presented on 12/19/2023 due to shortness of breath and edema.  He was seen at advanced heart failure clinic and recommended coming to hospital for IV diuresis and inotropes.  While inpatient on 1/70/2026, patient was found on the floor at doorway of bathroom with PEA.  CPR was performed, about 19 minutes for ROSC.  Routine EEG done yesterday showed burst suppression pattern.  However as sedation was weaned, patient was noted abdominal jerking concerning for myoclonic seizures.  Therefore long-term EEG was obtained today which showed highly epileptiform bursts.  Therefore neurology was consulted for further recommendations.   ROS: Unable to obtain due to altered mental status.   Past Medical History:  Diagnosis Date   Acute myocardial infarction of other anterior wall, subsequent episode of care 12/12/2011   Anal fissure    Arthritis    back   Atherosclerosis of native artery of both lower extremities with intermittent claudication 04/01/2018   Bruises easily    d/t being on Effient    CAD (coronary artery disease)    A. Acute Ant STEMI 12/12/2011    Cardiomyopathy secondary 12/26/2011   Chronic systolic CHF (congestive heart failure) (HCC)    ischemic CM // Echo 6/21: EF 30-35  //  Echocardiogram 10/21: LV apical thrombus resolved, EF 50-55, ant and apical, ant-lat HK, trivial MR, trivial AI   Chronic total occlusion of artery of the extremities 02/26/2012   Claudication 12/26/2011   Coronary  atherosclerosis of native coronary artery    a. ant STEMI with cardiac arrest 2013 s/p DES to mLAD. b. [place]  10/2016 s/p PTCA to mLAD.   Diabetes mellitus without complication (HCC)    Essential hypertension 10/23/2016   GERD (gastroesophageal reflux disease)    takes tums   History of blood transfusion    no abnormal reaction noted   History of kidney stones    Hypertension    Ischemic cardiomyopathy    a. EF 35% in 02/2012 at time of acute MI, improved to normal on subsequent imaging.   MI (myocardial infarction) (HCC)    AMI 1/13 - complicated by VT/Tosades   Mixed hyperlipidemia    NSTEMI (non-ST elevation myocardial infarction) (HCC) 03/26/2023   Numbness and tingling of right arm 03/31/2013   Occlusion and stenosis of carotid artery without mention of cerebral infarction 08/26/2012   PAD (peripheral artery disease)    followed by Vascular - aortobifem bypass 01/2013 & left carotid-subclavian artery bypass 04/2013   Paroxysmal atrial flutter (HCC) 07/20/2024   Peripheral vascular disease, unspecified 12/16/2012   PVD (peripheral vascular disease) 03/31/2013   Rectal polyp    ST elevation myocardial infarction involving left anterior descending (LAD) coronary artery (HCC) 07/31/2017   Subclavian steal syndrome 08/11/2013   Syncope 07/20/2024   Uncontrolled diabetes mellitus 10/23/2016   Unstable angina (HCC)    Wears partial dentures    upper    Family History  Problem Relation Age of Onset   Cancer Father        Lung   Diabetes Mother    COPD Mother    Cancer Maternal  Uncle        Colon    Social History:  reports that he has quit smoking. His smoking use included cigarettes. He has a 6.3 pack-year smoking history. He has never used smokeless tobacco. He reports current alcohol  use of about 2.0 standard drinks of alcohol  per week. He reports that he does not use drugs.   Medications Prior to Admission  Medication Sig Dispense Refill Last Dose/Taking   albuterol   (VENTOLIN  HFA) 108 (90 Base) MCG/ACT inhaler Inhale 1-2 puffs into the lungs every 6 (six) hours as needed for wheezing or shortness of breath.   12/17/2024   cetirizine (ZYRTEC) 10 MG tablet Take 10 mg by mouth daily as needed for allergies.   Unknown   clopidogrel  (PLAVIX ) 75 MG tablet TAKE 1 TABLET BY MOUTH EVERY DAY 15 tablet 0 12/18/2024   diazepam  (VALIUM ) 10 MG tablet Take 10 mg by mouth at bedtime.   12/17/2024   ELIQUIS  5 MG TABS tablet TAKE 1 TABLET BY MOUTH 2 TIMES A DAY 180 tablet 1 12/18/2024 at  7:00 AM   ezetimibe  (ZETIA ) 10 MG tablet TAKE 1 TABLET BY MOUTH EACH DAY 90 tablet 5 12/18/2024   Furosemide  (FUROSCIX ) 80 MG/10ML CTKT Inject 80 mg into the skin once daily as directed. 5 each 0 12/17/2024   gabapentin  (NEURONTIN ) 300 MG capsule Take 300 mg by mouth 3 (three) times daily.   12/17/2024   icosapent  Ethyl (VASCEPA ) 1 g capsule TAKE 2 CAPSULES BY MOUTH TWICE DAILY 360 capsule 3 12/18/2024   insulin  glargine (LANTUS ) 100 UNIT/ML Solostar Pen Inject 50 Units into the skin 2 (two) times daily.   12/18/2024   insulin  lispro (HUMALOG ) 100 UNIT/ML injection Inject 0-15 Units into the skin 3 (three) times daily as needed for high blood sugar.   12/17/2024   methocarbamol  (ROBAXIN ) 500 MG tablet Take 500 mg by mouth every 6 (six) hours as needed for muscle spasms.   Past Week   nitroGLYCERIN  (NITROSTAT ) 0.4 MG SL tablet Place 0.4 mg under the tongue every 5 (five) minutes as needed for chest pain.   Unknown   oxyCODONE -acetaminophen  (PERCOCET) 10-325 MG tablet Take 1 tablet by mouth 4 (four) times daily.   12/18/2024   pantoprazole  (PROTONIX ) 40 MG tablet Take 40 mg by mouth daily.   12/18/2024   potassium chloride  SA (KLOR-CON  M) 20 MEQ tablet Take 3 tablets (60 mEq total) by mouth 2 (two) times daily. Take an extra 2 tablets (40 meq total) when you take Metolazone . 180 tablet 3 12/18/2024   pseudoephedrine (SUDAFED) 30 MG tablet Take 30 mg by mouth every 4 (four) hours as needed for congestion.    12/17/2024   ramelteon  (ROZEREM ) 8 MG tablet TAKE 1 TABLET (8 MG TOTAL) BY MOUTH AT BEDTIME. 30 tablet 2 12/17/2024   REPATHA  SURECLICK 140 MG/ML SOAJ INJECT 140 MG INTO THE SKIN EVERY 14 DAYS AS DIRECTED 6 mL 3 12/13/2024   Tiotropium Bromide-Olodaterol (STIOLTO RESPIMAT ) 2.5-2.5 MCG/ACT AERS INHALE 2 PUFFS INTO THE LUNGS DAILY. 4 g 11 12/17/2024   torsemide  (DEMADEX ) 20 MG tablet Take 4 tablets (80 mg total) by mouth 2 (two) times daily. 240 tablet 3 12/18/2024   traZODone  (DESYREL ) 100 MG tablet Take 100 mg by mouth at bedtime.   12/17/2024   TRINTELLIX  20 MG TABS tablet Take 20 mg by mouth daily in the afternoon.   12/18/2024   ACCU-CHEK GUIDE TEST test strip       ADVOCATE INSULIN  SYRINGE 31G X 5/16  1 ML MISC       GNP PEN NEEDLES 32G X 4 MM MISC       metolazone  (ZAROXOLYN ) 5 MG tablet Take 1 tablet (5 mg total) by mouth daily. (Patient not taking: Reported on 12/18/2024) 4 tablet 0 Not Taking   OXYGEN Inhale 2 L into the lungs as needed (excessive movement).         Exam: Current vital signs: BP 101/60   Pulse 71   Temp 99.3 F (37.4 C) (Bladder)   Resp 20   Ht 5' 9 (1.753 m)   Wt 77.9 kg   SpO2 100%   BMI 25.36 kg/m  Vital signs in last 24 hours: Temp:  [98.6 F (37 C)-99.9 F (37.7 C)] 99.3 F (37.4 C) (01/19 1200) Pulse Rate:  [66-77] 71 (01/19 1200) Resp:  [0-25] 20 (01/19 1200) BP: (101-104)/(51-60) 101/60 (01/19 1133) SpO2:  [86 %-100 %] 100 % (01/19 1200) Arterial Line BP: (83-122)/(41-69) 105/52 (01/19 1200) FiO2 (%):  [40 %] 40 % (01/19 1200) Weight:  [77.9 kg] 77.9 kg (01/19 0500)   Physical Exam  Constitutional: Laying in bed, intubated Neuro: On propofol  at 15, pupils equally round and reactive to light but slowly, corneal reflex absent, reflex present, does not withdraw to noxious stimuli in all 4 extremities  I have reviewed labs in epic and the results pertinent to this consultation are: CBC:  Recent Labs  Lab 12/21/24 0434 12/21/24 0657  12/22/24 0420  WBC 23.7*  --  17.9*  HGB 8.6* 10.9* 8.6*  HCT 29.4* 32.0* 29.8*  MCV 77.2*  --  78.0*  PLT 326  --  350    Basic Metabolic Panel:  Lab Results  Component Value Date   NA 131 (L) 12/22/2024   K 4.1 12/22/2024   CO2 25 12/22/2024   GLUCOSE 78 12/22/2024   BUN 30 (H) 12/22/2024   CREATININE 1.42 (H) 12/22/2024   CALCIUM  8.5 (L) 12/22/2024   GFRNONAA 57 (L) 12/22/2024   GFRAA 87 06/18/2020   Lipid Panel:  Lab Results  Component Value Date   LDLCALC 25 03/02/2024   HgbA1c:  Lab Results  Component Value Date   HGBA1C 12.3 (H) 07/18/2024   Urine Drug Screen: No results found for: LABOPIA, COCAINSCRNUR, LABBENZ, AMPHETMU, THCU, LABBARB  Alcohol  Level No results found for: ETH   I have reviewed the images obtained: CT head without contrast on 12/20/24 did not show any acute abnormality.  ASSESSMENT/PLAN: 59 year old male with multiple medical comorbidities as noted in HPI.  Had a hospital cardiac arrest on 1/70/2026.  Now noted to have seizure-like activity with EEG showing highly epileptiform bursts.   Cardiac arrest Postanoxic myoclonic seizure-like activity -Will load with Keppra  3000 mg once and start 1000 mg twice daily -Increase propofol  to 25mcg/kg/hr for now with plan to increase further if needed to achieve better suppression -Continue video-EEG monitoring while we are adjusting medications -Given prolonged downtime, EEG findings and multiple medical comorbidities, I am concerned patient has sustained significant potentially irreversible neurologic injury - Would recommend MRI brain without contrast at 72 hours (tomorrow evening) for further evaluation of anoxic/hypoxic brain injury - Goals of care discussion with family - Discussed plan with ICU team via secure chat   Thank you for allowing us  to participate in the care of this patient. If you have any further questions, please contact  me or neurohospitalist.   CRITICAL  CARE Performed by: Arlin MALVA Krebs   Total critical care time: 35 minutes  Critical care time was exclusive of separately billable procedures and treating other patients.  Critical care was necessary to treat or prevent imminent or life-threatening deterioration.  Critical care was time spent personally by me on the following activities: development of treatment plan with patient and/or surrogate as well as nursing, discussions with consultants, evaluation of patient's response to treatment, examination of patient, obtaining history from patient or surrogate, ordering and performing treatments and interventions, ordering and review of laboratory studies, ordering and review of radiographic studies, pulse oximetry and re-evaluation of patient's condition.   Arlin Krebs Epilepsy [facility]

## 2024-12-22 NOTE — Progress Notes (Signed)
 Patient ID: Ryan [name], male   DOB: [birth year], 59 y.o.   MRN: [id number]  [facility] Progress Note   Assessment/ Plan:   1. Acute kidney Injury: Oliguric overnight with 900 cc urine output charted.  Etiology suspected to be ATN in the setting of cardiac arrest/bradycardia.  Started on CRRT overnight with -4.1 L fluid balance overnight.  Labs reviewed from this morning and the plan is to continue CRRT at the current prescription with net ultrafiltration of about 100 cc/h.  With urine output picking up, optimism raised for potential renal recovery. 2.  Hyponatremia: Secondary to acute kidney injury/impaired free water  handling.  Monitor with CRRT. 3.  Status post PEA cardiac arrest/bradycardia: With prolonged downtime before ROSC. 4.  Ventilator dependent respiratory failure: Status postcardiac arrest, ventilator adjustments per CCM.  Subjective:   Without acute events overnight.  Continues to tolerate CRRT without problems.   Objective:   BP (!) 104/51   Pulse 69   Temp 99.1 F (37.3 C) (Bladder)   Resp 15   Ht 5' 9 (1.753 m)   Wt 77.9 kg   SpO2 99%   BMI 25.36 kg/m   Intake/Output Summary (Last 24 hours) at 12/22/2024 0821 Last data filed at 12/22/2024 0800 Gross per 24 hour  Intake 1892.51 ml  Output 6386.7 ml  Net -4494.19 ml   Weight change: -12.8 kg  Physical Exam: Gen: Intubated, unresponsive CVS: Pulse regular rhythm, normal rate, S1 and S2 normal Resp: Clear to auscultation bilaterally, no rales/rhonchi.  On ventilator Abd: Soft, obese, nontender, bowel sounds normal Ext: No lower extremity edema noted.  Imaging: EEG adult Result Date: 12/21/2024 Matthews Elida HERO, MD     12/22/2024  5:14 AM Routine EEG Report Ryan [name] is a 59 y.o. male with a history of recent cardiac arrest and now altered mental status who is undergoing an EEG to evaluate for seizures. Report: This EEG was acquired with electrodes placed according to the International 10-20  electrode system (including Fp1, Fp2, F3, F4, C3, C4, P3, P4, O1, O2, T3, T4, T5, T6, A1, A2, Fz, Cz, Pz). The following electrodes were missing or displaced: none. This EEG was performed while patient was sedated on propofol  and fentanyl . The background was a burst suppression pattern, >90% suppressed, with very brief bursts <0.5 seconds consisting of low amplitude sharp waves. This background is not clearly reactive to stimulation. There was no clear waking rhythm nor sleep architecture. There was no focal slowing. There were no interictal epileptiform discharges outside of the bursts described above. There were no electrographic seizures identified. Impression and clinical correlation: This EEG was obtained comatose and sedated on propofol  and fentanyl  and is abnormal due to a burst suppression pattern indicating global cerebral dysfunction, medication effect, or both. No electrographic seizures were seen during this recording. If there is high clinical concern for seizures, recommend continuous video EEG monitoring. Elida Matthews, MD [facility] [phone] If 7pm- 7am, please page neurology on call as listed in AMION.   DG CHEST PORT 1 VIEW Result Date: 12/21/2024 EXAM: 1 VIEW XRAY OF THE CHEST 12/21/2024 08:23:00 AM COMPARISON: 12/20/2024 CLINICAL HISTORY: Encounter for central line placement [id number] FINDINGS: LINES, TUBES AND DEVICES: Right IJ central venous catheter in place with tip in mid SVC. Left PICC in place with tip at superior cavoatrial junction. Stable ETT and enteric tube in place. Right upper extremity PICC in place with tip in axillary vein. Loop recorder device in left chest. LUNGS AND PLEURA: Stable diffuse interstitial opacities.  Small bilateral pleural effusions right greater than left. Bibasilar atelectasis. SABRA No pneumothorax. HEART AND MEDIASTINUM: Post-CABG changes. Stable cardiomegaly. BONES AND SOFT TISSUES: Unchanged  fractured  median sternotomy wires. No acute osseous  abnormality. IMPRESSION: 1. Interval placement of right IJ catheter with tip in the projection of the SVC. No pneumothorax after catheter placement. 2. No change in bilateral pleural effusions and interstitial edema. Electronically signed by: Waddell Calk MD 12/21/2024 08:34 AM EST RP Workstation: [id number]   US  Abdomen Limited Result Date: 12/21/2024 EXAM: LIMITED ABDOMINAL ULTRASOUND FOR ASCITES EVALUATION TECHNIQUE: Limited real-time sonography of all 4 quadrants of the abdomen was performed for evaluation of ascites. COMPARISON: None available. CLINICAL HISTORY: Ascites. FINDINGS: RIGHT UPPER QUADRANT: Ascites is visualized. LEFT UPPER QUADRANT: Ascites is visualized. RIGHT LOWER QUADRANT: Ascites is visualized. LEFT LOWER QUADRANT: Ascites is visualized. The largest accumulation of fluid is noted in the left lower quadrant. OTHER: The contour of the liver appears slightly nodular. Normal parenchymal echogenicity without focal lesion. Incidental note made of a right pleural effusion. The findings of ascites and a nodular liver are most consistent with cirrhosis. Further evaluation of the liver and the cause of ascites is recommended. IMPRESSION: 1. Ascites, largest accumulation in the left lower quadrant. If there is known cirrhosis, then these findings may reflect underlying portal venous hypertension. Alternative etiologies include chf and renal failure. 2. Slightly nodular liver contour, suggestive of cirrhosis. If there is a clinical concern for cirrhosis and portal venous hypertension, dedicated liver imaging is recommended including liver ultrasound with hepatic elastography or liver protocol MRI with contrast material. Electronically signed by: Waddell Calk MD 12/21/2024 05:43 AM EST RP Workstation: [id number]   CT HEAD WO CONTRAST ( ) Result Date: 12/20/2024 EXAM: CT HEAD WITHOUT CONTRAST 12/20/2024 11:29:04 PM TECHNIQUE: CT of the head was performed without the administration of intravenous  contrast. Automated exposure control, iterative reconstruction, and/or weight based adjustment of the mA/kV was utilized to reduce the radiation dose to as low as reasonably achievable. COMPARISON: 11/13/2024 CLINICAL HISTORY: AMS, recent CODE, now ROSC AMS, recent CODE, now ROSC AMS, recent CODE, now ROSC FINDINGS: BRAIN AND VENTRICLES: No acute hemorrhage. No evidence of acute infarct. No hydrocephalus. No extra-axial collection. No mass effect or midline shift. Atherosclerosis of skullbase vasculature. ORBITS: No acute abnormality. SINUSES: Remote nasal bone fractures, unchanged. SOFT TISSUES AND SKULL: Endotracheal and orogastric tubes partially included. No acute soft tissue abnormality. No skull fracture. IMPRESSION: 1. No acute intracranial abnormality. Electronically signed by: Franky Stanford MD 12/20/2024 11:35 PM EST RP Workstation: [id number]   DG CHEST PORT 1 VIEW Result Date: 12/20/2024 EXAM: 1 VIEW(S) XRAY OF THE CHEST 12/20/2024 08:20:00 PM COMPARISON: 12/18/2024 CLINICAL HISTORY: Hypoxia. FINDINGS: LINES, TUBES AND DEVICES: Loop recorder device over left chest. Left upper PICC in place with distal tip at superior junction level. Right central venous catheter is noted over the mid subclavian vein withdrawn when compared with the prior exam. ET tube in place 3.0 cm above carina. Enteric tube in place coursing below diaphragm with distal tip beyond inferior film margin. LUNGS AND PLEURA: Stable pulmonary edema is noted. Stable moderate right pleural effusion with right basilar opacity. Small left pleural effusion. No pneumothorax. HEART AND MEDIASTINUM: Post-surgical changes from prior CABG. Aortic atherosclerosis. Stable cardiomegaly. BONES AND SOFT TISSUES: No acute osseous abnormality. IMPRESSION: 1. Stable pulmonary edema. 2. Stable moderate right pleural effusion with right basilar opacity. 3. Small left pleural effusion. Electronically signed by: Oneil Devonshire MD 12/20/2024 08:32 PM EST RP  Workstation: [id number]  ECHOCARDIOGRAM LIMITED Result Date: 12/20/2024    ECHOCARDIOGRAM LIMITED REPORT   Patient Name:   Ryan [name] Date of Exam: 12/20/2024 Medical Rec #:  [id number]    Height:       69.0 in Accession #:    [id number]   Weight:       231.5 lb Date of Birth:  12/28/1965   BSA:          2.198 m Patient Age:    58 years     BP:           110/60 mmHg Patient Gender: M            HR:           77 bpm. Exam Location:  Inpatient Procedure: Limited Echo, Intracardiac Opacification Agent and Color Doppler            (Both Spectral and Color Flow Doppler were utilized during            procedure). Indications:    CHF- Acute Systolic I50.21  History:        Patient has prior history of Echocardiogram examinations, most                 recent 12/20/2023. CHF and LV Thrombus, CAD; Risk                 Factors:Diabetes, Hypertension and Dyslipidemia.  Sonographer:    Koleen Popper RDCS Referring Phys: FAIRY FRAMES  Sonographer Comments: Image acquisition challenging due to patient body habitus. IMPRESSIONS  1. No apical thrombus on contrast imaging. Left ventricular ejection fraction, by estimation, is 35 to 40%. The left ventricle has moderately decreased function. The left ventricle demonstrates regional wall motion abnormalities (see scoring diagram/findings for description).  2. Right ventricular systolic function is severely reduced. The right ventricular size is severely enlarged.  3. The aortic valve was not well visualized. Aortic valve regurgitation is mild.  4. The inferior vena cava is dilated in size with <50% respiratory variability, suggesting right atrial pressure of 15 mmHg. FINDINGS  Left Ventricle: No apical thrombus on contrast imaging. Left ventricular ejection fraction, by estimation, is 35 to 40%. The left ventricle has moderately decreased function. The left ventricle demonstrates regional wall motion abnormalities. Definity  contrast agent was given IV to delineate the left  ventricular endocardial borders. The left ventricular internal cavity size was normal in size. There is no left ventricular hypertrophy.  LV Wall Scoring: The apical septal segment and apex are akinetic. Right Ventricle: The right ventricular size is severely enlarged. No increase in right ventricular wall thickness. Right ventricular systolic function is severely reduced. Aortic Valve: The aortic valve was not well visualized. Aortic valve regurgitation is mild. Venous: The inferior vena cava is dilated in size with less than 50% respiratory variability, suggesting right atrial pressure of 15 mmHg. Additional Comments: Color Doppler performed.  LEFT VENTRICLE PLAX 2D LVIDd:         5.30 cm LVIDs:         3.75 cm LV PW:         1.00 cm LV IVS:        0.90 cm  LV Volumes (MOD) LV vol d, MOD A4C: 125.0 ml LV vol s, MOD A4C: 77.3 ml LV SV MOD A4C:     125.0 ml IVC IVC diam: 2.60 cm LEFT ATRIUM         Index LA diam:    5.10 cm 2.32 cm/m Darryle Decent  MD Electronically signed by Darryle Decent MD Signature Date/Time: 12/20/2024/12:54:58 PM    Final     Labs: BMET Recent Labs  Lab 12/19/24 0301 12/19/24 1434 12/20/24 0230 12/20/24 1844 12/20/24 1940 12/20/24 2015 12/21/24 0001 12/21/24 0142 12/21/24 0348 12/21/24 0434 12/21/24 0657 12/21/24 1600 12/22/24 0420  NA 132*   < > 127* 108*   < > 129* 128* 126* 127* 126* 126* 129* 131*  K 2.9*   < > 3.7 2.7*   < > 3.4* 3.7 4.1 4.1 4.6 4.6 4.7 4.1  CL 87*   < > 86* <65*  --  88*  --  86*  --  86*  --  90* 94*  CO2 33*   < > 30 15*  --  26  --  24  --  24  --  25 25  GLUCOSE 223*   < > 126* 1,117*  --  77  --  120*  --  131*  --  111* 78  BUN 48*   < > 44* 33*  --  43*  --  47*  --  47*  --  39* 30*  CREATININE 1.50*   < > 1.61* 1.53*  --  1.82*  --  1.96*  --  2.00*  --  1.77* 1.42*  CALCIUM  8.5*   < > 8.5* 6.9*  --  8.7*  --  8.7*  --  8.9  --  8.6* 8.5*  PHOS 4.3  --  4.2  --   --   --   --   --   --  5.3*  --  4.5 3.3   < > = values in this  interval not displayed.   CBC Recent Labs  Lab 12/20/24 1844 12/20/24 1940 12/20/24 2015 12/21/24 0001 12/21/24 0348 12/21/24 0434 12/21/24 0657 12/22/24 0420  WBC 13.6*  --  22.6*  --   --  23.7*  --  17.9*  HGB 6.5*   < > 8.0*   < > 10.9* 8.6* 10.9* 8.6*  HCT 26.9*   < > 27.8*   < > 32.0* 29.4* 32.0* 29.8*  MCV 92.8  --  78.1*  --   --  77.2*  --  78.0*  PLT 224  --  274  --   --  326  --  350   < > = values in this interval not displayed.    Medications:     arformoterol   15 mcg Nebulization BID   And   umeclidinium bromide   1 puff Inhalation Daily   Chlorhexidine  Gluconate Cloth  6 each Topical Daily   clopidogrel   75 mg Per Tube Daily   docusate  100 mg Per Tube Daily   ezetimibe   10 mg Per Tube Daily   insulin  aspart  0-15 Units Subcutaneous Q4H   insulin  glargine  20 Units Subcutaneous Daily   mouth rinse  15 mL Mouth Rinse Q2H   pantoprazole  (PROTONIX ) IV  40 mg Intravenous QHS   polyethylene glycol  17 g Per Tube BID   sodium chloride  flush  3 mL Intravenous Q12H    Gordy Blanch, MD 12/22/2024, 8:21 AM

## 2024-12-22 NOTE — Inpatient Diabetes Management (Signed)
 Inpatient Diabetes Program Recommendations  AACE/ADA: New Consensus Statement on Inpatient Glycemic Control (2015)  Target Ranges:  Prepandial:   less than 140 mg/dL      Peak postprandial:   less than 180 mg/dL (1-2 hours)      Critically ill patients:  140 - 180 mg/dL   Lab Results  Component Value Date   GLUCAP 109 (H) 12/22/2024   HGBA1C 12.3 (H) 07/18/2024    Latest Reference Range & Units 12/21/24 22:08 12/21/24 23:33 12/22/24 04:12 12/22/24 08:19 12/22/24 08:22 12/22/24 08:42 12/22/24 11:29 12/22/24 11:31 12/22/24 11:55  Glucose-Capillary 70 - 99 mg/dL 91 91 83 49 (L) 61 (L) 116 (H) 37 (LL) 63 (L) 109 (H)  (LL): Data is critically low (L): Data is abnormally low (H): Data is abnormally high   Outpatient Diabetes medications:  Lantus  50 units BID  Humalog  0-15 units TID  Current orders for Inpatient glycemic control: Lantus  20 units daily Novolog  0-15 units q 4 hrs.   Inpatient Diabetes Program Recommendations:   Patient received Lantus  20 units yesterday and fasting this am was 49. Please consider: -Decrease Lantus  to 15 units daily -Decrease Novolog  correction to 0-6 units q 4 hrs.  Thank you, Maeven Mcdougall E. Tamico Mundo, RN, MSN, CNS, CDCES  Diabetes Coordinator Inpatient Glycemic Control Team Team Pager [phone] (8am-5pm) 12/22/2024 12:05 PM

## 2024-12-22 NOTE — Progress Notes (Signed)
 Nutrition Follow-up  DOCUMENTATION CODES:   Non-severe (moderate) malnutrition in context of chronic illness  INTERVENTION:   Tube Feeding via OG:  Vital 1.5 at 55 ml/h (1320 ml per day) Begin TF at rate of 10 ml/hr with slow titration to goal rate, orders per MD Prosource TF20 60 ml TID Provides 2220 kcal, 149 gm protein, 1008 ml free water  daily  Add Rena-Vite daily Add Thiamine  100 mg daily x 7 days  NUTRITION DIAGNOSIS:   Moderate Malnutrition related to chronic illness as evidenced by mild muscle depletion, mild fat depletion, edema.  Continues but being addressed  GOAL:   Patient will meet greater than or equal to 90% of their needs  Progressing  MONITOR:   Vent status, Labs, Weight trends, TF tolerance  REASON FOR ASSESSMENT:   Consult Assessment of nutrition requirement/status (CRRT)  ASSESSMENT:   59 yo male admitted with acute on chronic CHF with SOB and increased edema. PMH includes HLD, PAD, GERD, HTN, CAD, DM, kidney stones, ischemic cardiomyopathy, PVD, CHF, former smoker, anal fissure, rectal polyp.  1/15 Admitted 1/17 PEA arrest with CPR x 19 mins 1/18 CRRT initiated  Pt remains on vent support, CRRT Epinephrine  at 9, CVP high at 19. Pitting edema noted in BLE (especially in thigh, hip) in addition to abd/thoracic region  Hypoglycemic this AM, CBGs 49-69  OG tube with 850 mL in 24 hours No BM since admission, abdomen obese but soft, BS present. Noted scheduled bowel regimen started today  Admit weight: 102.1 kg (1/15) Current weight: 77.9 kg (1/18), weight yesterday 90.7 kg, 105 kg the day before   Net negative 5-6 L since admission.  UOP 900 mL in 24 hours, >4L removed with CRRT   LABS: Sodium 131 (L) Potassium 4.1 (wdl) BUN 30/Creatinine 1.42 Phosphorus 3.3 (wdl) Magnesium  2.6 (H)  MEDS: Colace daily SS novolog  Miralax   NUTRITION - FOCUSED PHYSICAL EXAM:  Flowsheet Row Most Recent Value  Orbital Region Mild depletion   Upper Arm Region Moderate depletion  Thoracic and Lumbar Region Unable to assess  Buccal Region Unable to assess  Temple Region Mild depletion  Clavicle Bone Region Mild depletion  Clavicle and Acromion Bone Region Mild depletion  Scapular Bone Region Mild depletion  Dorsal Hand Unable to assess  Patellar Region Unable to assess  Anterior Thigh Region Unable to assess  Posterior Calf Region Unable to assess  Edema (RD Assessment) Severe  [BLE, abdomen/thoracic region, hands]  Hair Reviewed  Eyes Unable to assess  Mouth Unable to assess  Skin Reviewed  Nails Reviewed    Diet Order:   Diet Order             Diet NPO time specified  Diet effective now                   EDUCATION NEEDS:   Not appropriate for education at this time  Skin:  Skin Assessment: Reviewed RN Assessment  Last BM:  1/14  Height:   Ht Readings from Last 1 Encounters:  12/20/24 5' 9 (1.753 m)    Weight:   Wt Readings from Last 1 Encounters:  12/22/24 77.9 kg     BMI:  Body mass index is 25.36 kg/m.  Estimated Nutritional Needs:   Kcal:  2100-2300  Protein:  125-155 g  Fluid:  1.8 L  Betsey Finger MS, RDN, LDN, CNSC Registered Dietitian 3 Clinical Nutrition RD Inpatient Contact Info in Amion

## 2024-12-22 NOTE — Progress Notes (Signed)
 PHARMACY - ANTICOAGULATION CONSULT NOTE  Pharmacy Consult for heparin  Indication: atrial fibrillation  Allergies[1]  Patient Measurements: Height: 5' 9 (175.3 cm) Weight: 77.9 kg (171 lb 11.8 oz) IBW/kg (Calculated) : 70.7 HEPARIN  DW (KG): 93.4  Vital Signs: Temp: 99.3 F (37.4 C) (01/19 1200) Temp Source: Bladder (01/19 1200) BP: 101/60 (01/19 1133) Pulse Rate: 71 (01/19 1200)  Labs: Recent Labs    12/20/24 1844 12/20/24 1940 12/20/24 2015 12/21/24 0001 12/21/24 0434 12/21/24 0657 12/21/24 1600 12/21/24 1958 12/22/24 0420 12/22/24 1126  HGB 6.5*   < > 8.0*   < > 8.6* 10.9*  --   --  8.6*  --   HCT 26.9*   < > 27.8*   < > 29.4* 32.0*  --   --  29.8*  --   PLT 224  --  274  --  326  --   --   --  350  --   APTT  --   --   --   --   --   --   --  64* 96* 89*  LABPROT 37.8*  --   --   --   --   --   --   --   --   --   INR 3.6*  --   --   --   --   --   --   --   --   --   HEPARINUNFRC  --   --   --   --   --   --   --  >1.10* >1.10*  --   CREATININE 1.53*  --  1.82*   < > 2.00*  --  1.77*  --  1.42*  --    < > = values in this interval not displayed.    Estimated Creatinine Clearance: 56.7 mL/min (A) (by C-G formula based on SCr of 1.42 mg/dL (H)).   Medical History: Past Medical History:  Diagnosis Date   Acute myocardial infarction of other anterior wall, subsequent episode of care 12/12/2011   Anal fissure    Arthritis    back   Atherosclerosis of native artery of both lower extremities with intermittent claudication 04/01/2018   Bruises easily    d/t being on Effient    CAD (coronary artery disease)    A. Acute Ant STEMI 12/12/2011    Cardiomyopathy secondary 12/26/2011   Chronic systolic CHF (congestive heart failure) (HCC)    ischemic CM // Echo 6/21: EF 30-35  //  Echocardiogram 10/21: LV apical thrombus resolved, EF 50-55, ant and apical, ant-lat HK, trivial MR, trivial AI   Chronic total occlusion of artery of the extremities 02/26/2012    Claudication 12/26/2011   Coronary atherosclerosis of native coronary artery    a. ant STEMI with cardiac arrest 2013 s/p DES to mLAD. b. [place]  10/2016 s/p PTCA to mLAD.   Diabetes mellitus without complication (HCC)    Essential hypertension 10/23/2016   GERD (gastroesophageal reflux disease)    takes tums   History of blood transfusion    no abnormal reaction noted   History of kidney stones    Hypertension    Ischemic cardiomyopathy    a. EF 35% in 02/2012 at time of acute MI, improved to normal on subsequent imaging.   MI (myocardial infarction) (HCC)    AMI 1/13 - complicated by VT/Tosades   Mixed hyperlipidemia    NSTEMI (non-ST elevation myocardial infarction) (HCC) 03/26/2023   Numbness and tingling of right  arm 03/31/2013   Occlusion and stenosis of carotid artery without mention of cerebral infarction 08/26/2012   PAD (peripheral artery disease)    followed by Vascular - aortobifem bypass 01/2013 & left carotid-subclavian artery bypass 04/2013   Paroxysmal atrial flutter (HCC) 07/20/2024   Peripheral vascular disease, unspecified 12/16/2012   PVD (peripheral vascular disease) 03/31/2013   Rectal polyp    ST elevation myocardial infarction involving left anterior descending (LAD) coronary artery (HCC) 07/31/2017   Subclavian steal syndrome 08/11/2013   Syncope 07/20/2024   Uncontrolled diabetes mellitus 10/23/2016   Unstable angina (HCC)    Wears partial dentures    upper    Medications:  Scheduled:   amiodarone   400 mg Per Tube Daily   arformoterol   15 mcg Nebulization BID   And   umeclidinium bromide   1 puff Inhalation Daily   Chlorhexidine  Gluconate Cloth  6 each Topical Daily   clopidogrel   75 mg Per Tube Daily   docusate  100 mg Per Tube Daily   ezetimibe   10 mg Per Tube Daily   feeding supplement (PROSource TF20)  60 mL Per Tube TID   insulin  aspart  0-15 Units Subcutaneous Q4H   insulin  glargine  20 Units Subcutaneous Daily   levETIRAcetam   1,000 mg  Intravenous Q12H   multivitamin  1 tablet Per Tube QHS   mouth rinse  15 mL Mouth Rinse Q2H   pantoprazole  (PROTONIX ) IV  40 mg Intravenous QHS   polyethylene glycol  17 g Per Tube BID   sodium chloride  flush  3 mL Intravenous Q12H   thiamine   100 mg Per Tube Daily    Assessment: 58 YOM admitted on 12/19/23 now s/p PEA arrest. Patient fell in bathroom, head CT unremarkable. Patient on Eliquis  PTA for history of atrial flutter s/p ablation. Patient started on Eliquis  earlier this admission, last dose administered on 12/20/24 at 1030. Pharmacy consulted for heparin  dosing.   6 hour confirmatory aPTT 89 sec, therapeutic at heparin  1400 units/hr. HL falsely elevated >1.10 due to recent DOAC administration. Will monitor aPTT and heparin  levels until correlating.   CBC stable (Hgb 8.6, PLT 350). Per RN, no issues with the heparin  gtt overnight, but some IV sites appear with increased oozing this morning. No major bleeding noted.   Goal of Therapy:  Heparin  level 66-102 units/ml aPTT 0.3-0.5 seconds Monitor platelets by anticoagulation protocol: Yes   Plan:  Continue heparin  1400 units/hr Monitor aPTT and anti-Xa level daily until correlating Monitor CBC and s/sx of bleeding daily F/u transition to Eliquis  when/if able   Vermell Mccallum, PharmD         [1]  Allergies Allergen Reactions   Metformin Anaphylaxis    Water  blisters between fingers   Hydrocodone  Hives and Nausea And Vomiting

## 2024-12-22 NOTE — Progress Notes (Signed)
 LTM EEG hooked up and running - no initial skin breakdown - push button tested - [facility] monitoring. MRI compatible leads used.

## 2024-12-22 NOTE — Progress Notes (Signed)
 VENTILATOR WEAN NOTE 12/22/2024  Start Mode: PRVC  Wean Mode: Pressure Support  Duration before failure: 0  Reason for failure: Apnea  Notes: RT attempted vent wean PS/CPAP 20/5. Patient failed due to apnea.

## 2024-12-22 NOTE — Progress Notes (Signed)
 PHARMACY - ANTICOAGULATION CONSULT NOTE  Pharmacy Consult for heparin  Indication: atrial fibrillation  Allergies[1]  Patient Measurements: Height: 5' 9 (175.3 cm) Weight: 77.9 kg (171 lb 11.8 oz) IBW/kg (Calculated) : 70.7 HEPARIN  DW (KG): 93.4  Vital Signs: Temp: 99 F (37.2 C) (01/19 0717) Temp Source: Bladder (01/19 0400) BP: 104/51 (01/19 0717) Pulse Rate: 69 (01/19 0717)  Labs: Recent Labs    12/20/24 1844 12/20/24 1940 12/20/24 2015 12/21/24 0001 12/21/24 0434 12/21/24 0657 12/21/24 1600 12/21/24 1958 12/22/24 0420  HGB 6.5*   < > 8.0*   < > 8.6* 10.9*  --   --  8.6*  HCT 26.9*   < > 27.8*   < > 29.4* 32.0*  --   --  29.8*  PLT 224  --  274  --  326  --   --   --  350  APTT  --   --   --   --   --   --   --  64* 96*  LABPROT 37.8*  --   --   --   --   --   --   --   --   INR 3.6*  --   --   --   --   --   --   --   --   HEPARINUNFRC  --   --   --   --   --   --   --  >1.10* >1.10*  CREATININE 1.53*  --  1.82*   < > 2.00*  --  1.77*  --  1.42*   < > = values in this interval not displayed.    Estimated Creatinine Clearance: 56.7 mL/min (A) (by C-G formula based on SCr of 1.42 mg/dL (H)).   Medical History: Past Medical History:  Diagnosis Date   Acute myocardial infarction of other anterior wall, subsequent episode of care 12/12/2011   Anal fissure    Arthritis    back   Atherosclerosis of native artery of both lower extremities with intermittent claudication 04/01/2018   Bruises easily    d/t being on Effient    CAD (coronary artery disease)    A. Acute Ant STEMI 12/12/2011    Cardiomyopathy secondary 12/26/2011   Chronic systolic CHF (congestive heart failure) (HCC)    ischemic CM // Echo 6/21: EF 30-35  //  Echocardiogram 10/21: LV apical thrombus resolved, EF 50-55, ant and apical, ant-lat HK, trivial MR, trivial AI   Chronic total occlusion of artery of the extremities 02/26/2012   Claudication 12/26/2011   Coronary atherosclerosis of native  coronary artery    a. ant STEMI with cardiac arrest 2013 s/p DES to mLAD. b. [place]  10/2016 s/p PTCA to mLAD.   Diabetes mellitus without complication (HCC)    Essential hypertension 10/23/2016   GERD (gastroesophageal reflux disease)    takes tums   History of blood transfusion    no abnormal reaction noted   History of kidney stones    Hypertension    Ischemic cardiomyopathy    a. EF 35% in 02/2012 at time of acute MI, improved to normal on subsequent imaging.   MI (myocardial infarction) (HCC)    AMI 1/13 - complicated by VT/Tosades   Mixed hyperlipidemia    NSTEMI (non-ST elevation myocardial infarction) (HCC) 03/26/2023   Numbness and tingling of right arm 03/31/2013   Occlusion and stenosis of carotid artery without mention of cerebral infarction 08/26/2012   PAD (peripheral artery disease)  followed by Vascular - aortobifem bypass 01/2013 & left carotid-subclavian artery bypass 04/2013   Paroxysmal atrial flutter (HCC) 07/20/2024   Peripheral vascular disease, unspecified 12/16/2012   PVD (peripheral vascular disease) 03/31/2013   Rectal polyp    ST elevation myocardial infarction involving left anterior descending (LAD) coronary artery (HCC) 07/31/2017   Subclavian steal syndrome 08/11/2013   Syncope 07/20/2024   Uncontrolled diabetes mellitus 10/23/2016   Unstable angina (HCC)    Wears partial dentures    upper    Medications:  Scheduled:   arformoterol   15 mcg Nebulization BID   And   umeclidinium bromide   1 puff Inhalation Daily   Chlorhexidine  Gluconate Cloth  6 each Topical Daily   clopidogrel   75 mg Per Tube Daily   ezetimibe   10 mg Per Tube Daily   insulin  aspart  0-15 Units Subcutaneous Q4H   insulin  glargine  20 Units Subcutaneous Daily   mouth rinse  15 mL Mouth Rinse Q2H   pantoprazole  (PROTONIX ) IV  40 mg Intravenous QHS   sodium chloride  flush  3 mL Intravenous Q12H    Assessment: 58 YOM admitted on 12/19/23 now s/p PEA arrest. Patient fell in  bathroom, head CT unremarkable. Patient on Eliquis  PTA for history of atrial flutter s/p ablation. Patient started on Eliquis  earlier this admission, last dose administered on 12/20/24 at 1030. Pharmacy consulted for heparin  dosing.   aPTT 96 sec, therapeutic at heparin  1400 units/hr. HL falsely elevated >1.10 due to recent DOAC administration. Will monitor aPTT and heparin  levels until correlating.   CBC stable (Hgb 8.6, PLT 350). Per RN, no issues with the heparin  gtt overnight, but some IV sites appear with increased oozing this morning. No major bleeding noted.   Goal of Therapy:  Heparin  level 66-102 units/ml aPTT 0.3-0.5 seconds Monitor platelets by anticoagulation protocol: Yes   Plan:  Continue heparin  1400 units/hr Confirmatory aptt in 8 hours  Monitor aPTT and anti-Xa level daily until correlating Monitor CBC and s/sx of bleeding daily F/u transition to Eliquis  if able   Vermell Mccallum, PharmD        [1]  Allergies Allergen Reactions   Metformin Anaphylaxis    Water  blisters between fingers   Hydrocodone  Hives and Nausea And Vomiting

## 2024-12-22 NOTE — Progress Notes (Signed)
 "    Advanced Heart Failure Rounding Note  Cardiologist: [name], MD  [facility] Cardiologist: Dr. Zenaida Chief Complaint: a/c systolic heart failure  Patient Profile   Ryan [name] is a 59 y.o. male with a PMH of CAD s/p multiple PCIs and CABG, chronic systolic heart failure d/t ischemic CM, AFL s/p AFL ablation, PAD, HLD, LV apical thrombus, carotid artery disease and tobacco use, admitted w/ a/c CHF in the setting of dietary indiscretion. Failed attempts at outpatient diuresis.   Significant events:   1/15: placed lasix  gtt @20 /hr + Diamox  1/17: PEA arrest with CPR. Moved to ICU 1/18: CRRT started  Subjective:    PEA arrest 1/17, CT head negative for anoxic injury.  Now intubated/sedated on CRRT.   Remains on Epi, now down to 8. Pulling fluid well, CVP still around 19.   Objective:   Weight Range: 77.9 kg Body mass index is 25.36 kg/m.   Vital Signs:   Temp:  [97.8 F (36.6 C)-99.9 F (37.7 C)] 99.1 F (37.3 C) (01/19 1100) Pulse Rate:  [65-77] 69 (01/19 1045) Resp:  [0-25] 23 (01/19 1100) BP: (102-106)/(51-57) 104/51 (01/19 0717) SpO2:  [86 %-100 %] 100 % (01/19 1045) Arterial Line BP: (83-122)/(41-69) 105/52 (01/19 1100) FiO2 (%):  [40 %] 40 % (01/19 0800) Weight:  [77.9 kg] 77.9 kg (01/19 0500) Last BM Date : 12/17/24  Weight change: Filed Weights   12/20/24 0440 12/21/24 0500 12/22/24 0500  Weight: 105 kg 90.7 kg 77.9 kg    Intake/Output:   Intake/Output Summary (Last 24 hours) at 12/22/2024 1121 Last data filed at 12/22/2024 1100 Gross per 24 hour  Intake 2071.64 ml  Output 6781.7 ml  Net -4710.06 ml     Physical Exam   GENERAL: Intubated PULM:  Ventilated breath sounds CARDIAC:  JVP: flat         Regular rate with regular rhythm. Systolic extremities. ABDOMEN: Soft, non-tender, non-distended. NEUROLOGIC: Sedated   Telemetry   NSR 80s w/ occasional PVCs, 4 beat run of NSVT, personally reviewed   Labs   CBC Recent Labs     12/21/24 0434 12/21/24 0657 12/22/24 0420  WBC 23.7*  --  17.9*  HGB 8.6* 10.9* 8.6*  HCT 29.4* 32.0* 29.8*  MCV 77.2*  --  78.0*  PLT 326  --  350   Basic Metabolic Panel Recent Labs    98/81/73 0434 12/21/24 0657 12/21/24 1600 12/22/24 0420  NA 126*   < > 129* 131*  K 4.6   < > 4.7 4.1  CL 86*  --  90* 94*  CO2 24  --  25 25  GLUCOSE 131*  --  111* 78  BUN 47*  --  39* 30*  CREATININE 2.00*  --  1.77* 1.42*  CALCIUM  8.9  --  8.6* 8.5*  MG 3.5*  --   --  2.6*  PHOS 5.3*  --  4.5 3.3   < > = values in this interval not displayed.   Liver Function Tests Recent Labs    12/20/24 1844 12/20/24 2015 12/21/24 0434 12/21/24 1600 12/22/24 0420  AST 29 28  --   --   --   ALT 10 14  --   --   --   ALKPHOS 220* 278*  --   --   --   BILITOT 1.0 1.5*  --   --   --   PROT 5.5* 6.9  --   --   --   ALBUMIN  2.6*  3.3*   < > 3.5 3.3*   < > = values in this interval not displayed.   No results for input(s): LIPASE, AMYLASE in the last 72 hours. Cardiac Enzymes No results for input(s): CKTOTAL, CKMB, CKMBINDEX, TROPONINI in the last 72 hours.  BNP: BNP (last 3 results) Recent Labs    07/29/24 0856 11/13/24 1242 11/17/24 1436  BNP 1,679.4* 1,211.2* 1,251.0*    ProBNP (last 3 results) Recent Labs    12/18/24 1237  PROBNP 3,670.0*     D-Dimer No results for input(s): DDIMER in the last 72 hours. Hemoglobin A1C No results for input(s): HGBA1C in the last 72 hours. Fasting Lipid Panel Recent Labs    12/22/24 0420  TRIG 75   Medications:   Scheduled Medications:  arformoterol   15 mcg Nebulization BID   And   umeclidinium bromide   1 puff Inhalation Daily   Chlorhexidine  Gluconate Cloth  6 each Topical Daily   clopidogrel   75 mg Per Tube Daily   docusate  100 mg Per Tube Daily   ezetimibe   10 mg Per Tube Daily   feeding supplement (PROSource TF20)  60 mL Per Tube TID   insulin  aspart  0-15 Units Subcutaneous Q4H   insulin  glargine  20 Units  Subcutaneous Daily   multivitamin  1 tablet Per Tube QHS   mouth rinse  15 mL Mouth Rinse Q2H   pantoprazole  (PROTONIX ) IV  40 mg Intravenous QHS   polyethylene glycol  17 g Per Tube BID   sodium chloride  flush  3 mL Intravenous Q12H   thiamine   100 mg Per Tube Daily    Infusions:  amiodarone  30 mg/hr (12/22/24 1100)   epinephrine  8 mcg/min (12/22/24 1100)   feeding supplement (VITAL 1.5 CAL) 10 mL/hr at 12/22/24 1100   fentaNYL  infusion INTRAVENOUS 25 mcg/hr (12/22/24 1100)   heparin  1,400 Units/hr (12/22/24 1100)   prismasol  BGK 4/2.5 400 mL/hr at 12/21/24 2233   prismasol  BGK 4/2.5 400 mL/hr at 12/21/24 2234   prismasol  BGK 4/2.5 1,500 mL/hr at 12/22/24 9062   propofol  (DIPRIVAN ) infusion 15 mcg/kg/min (12/22/24 1100)    PRN Medications: acetaminophen  **OR** acetaminophen , albuterol , fentaNYL  (SUBLIMAZE ) injection, heparin , midazolam  PF, mineral oil-hydrophilic petrolatum , mouth rinse, oxyCODONE -acetaminophen  **AND** oxyCODONE , sodium chloride , sodium chloride  flush  Assessment/Plan   PEA arrest - on 1/17 - now intubated/sedated  - on Epi  - With progressive RV dysfunction, suspect due to volume overload, acute on chronic ACC/AHA stage D cardiomyopathy  Acute on chronic Systolic Heart failure with biventricular failure and cardiogenic shock: Ischemic related with recent worsening in the setting of atrial flutter.  - S/p flutter ablation, though having rare episodes of fib.   - NYHA IV. Has failed outpatient diuretic titration. Admitted with marked volume overload. End stage disease - Echo 1/17 EF 35-40% RV severely reduced  - Now on epi for BP support -Coox up to 64 this morning, continue to wean epi slowly - Volume management with CRRT - Not candidate for mechanical support or advanced therapies - Pending neuro prognostication may need palliative care eval  AFL/AFib: Recurrent, s/p atrial flutter ablation with Dr. Kennyth 6/25. Loop recorder implanted to evaluate for  atrial fibrillation. - s/p AF ablation 12/25. - Stop IV amiodarone , start oral amiodarone  400mg  daily - in NSR with PVCs - Continue heparin  gtt  AKI - due to ATN from shock/cardiac arrest - now on CRRT  CAD: - Continue Plavix  & apixaban  - Continue atorvastatin , repatha , LDL at goal. - No s/s angina  PAD: Extensive history, carotid as well as femoral disease.  - Not a candidate for advanced therapies - Continue eliquis , plavix , aggressive risk factor modification - Smoking cessation encouraged   DM: - Uncontrolled.  - Last A1c 12.5 8/25 - He is on insulin  - Off SGLT2i for now  Hypokalemia: - supp as needed CRITICAL CARE Performed by: Morene JINNY Brownie   Total critical care time: 32 minutes  Critical care time was exclusive of separately billable procedures and treating other patients.  Critical care was necessary to treat or prevent imminent or life-threatening deterioration.  Critical care was time spent personally by me on the following activities: development of treatment plan with patient and/or surrogate as well as nursing, discussions with consultants, evaluation of patient's response to treatment, examination of patient, obtaining history from patient or surrogate, ordering and performing treatments and interventions, ordering and review of laboratory studies, ordering and review of radiographic studies, pulse oximetry and re-evaluation of patient's condition. \  Length of Stay: 4  Morene JINNY Brownie, MD  12/22/2024, 11:21 AM  Advanced Heart Failure Team Pager [phone] (M-F; 7a - 5p)   Please visit [email or web address]: For overnight coverage please call cardiology fellow first. If fellow not available call Shock/ECMO MD on call.  For ECMO / Mechanical Support (Impella, IABP, LVAD) issues call Shock / ECMO MD on call.    "

## 2024-12-22 NOTE — Plan of Care (Signed)
   Problem: Elimination: Goal: Will not experience complications related to urinary retention Outcome: Progressing   Problem: Safety: Goal: Ability to remain free from injury will improve Outcome: Progressing

## 2024-12-22 NOTE — Procedures (Signed)
 Routine EEG Report  Ryu Cerreta is a 59 y.o. male with a history of recent cardiac arrest and now altered mental status who is undergoing an EEG to evaluate for seizures.  Report: This EEG was acquired with electrodes placed according to the International 10-20 electrode system (including Fp1, Fp2, F3, F4, C3, C4, P3, P4, O1, O2, T3, T4, T5, T6, A1, A2, Fz, Cz, Pz). The following electrodes were missing or displaced: none.  This EEG was performed while patient was sedated on propofol  and fentanyl . The background was a burst suppression pattern, >90% suppressed, with very brief bursts <0.5 seconds consisting of low amplitude sharp waves. This background is not clearly reactive to stimulation. There was no clear waking rhythm nor sleep architecture. There was no focal slowing. There were no interictal epileptiform discharges outside of the bursts described above. There were no electrographic seizures identified.   Impression and clinical correlation: This EEG was obtained comatose and sedated on propofol  and fentanyl  and is abnormal due to a burst suppression pattern indicating global cerebral dysfunction, medication effect, or both.  No electrographic seizures were seen during this recording. If there is high clinical concern for seizures, recommend continuous video EEG monitoring.   Elida Ross, MD [facility] [phone]  If 7pm- 7am, please page neurology on call as listed in [facility].

## 2024-12-23 ENCOUNTER — Ambulatory Visit: Payer: Self-pay | Admitting: Primary Care

## 2024-12-23 ENCOUNTER — Inpatient Hospital Stay (HOSPITAL_COMMUNITY)

## 2024-12-23 DIAGNOSIS — G4089 Other seizures: Secondary | ICD-10-CM | POA: Diagnosis not present

## 2024-12-23 DIAGNOSIS — G931 Anoxic brain damage, not elsewhere classified: Secondary | ICD-10-CM | POA: Diagnosis not present

## 2024-12-23 DIAGNOSIS — I110 Hypertensive heart disease with heart failure: Secondary | ICD-10-CM | POA: Diagnosis not present

## 2024-12-23 DIAGNOSIS — I469 Cardiac arrest, cause unspecified: Secondary | ICD-10-CM

## 2024-12-23 DIAGNOSIS — I5043 Acute on chronic combined systolic (congestive) and diastolic (congestive) heart failure: Secondary | ICD-10-CM | POA: Diagnosis not present

## 2024-12-23 DIAGNOSIS — G40401 Other generalized epilepsy and epileptic syndromes, not intractable, with status epilepticus: Secondary | ICD-10-CM | POA: Diagnosis not present

## 2024-12-23 DIAGNOSIS — I2510 Atherosclerotic heart disease of native coronary artery without angina pectoris: Secondary | ICD-10-CM | POA: Diagnosis not present

## 2024-12-23 DIAGNOSIS — I5023 Acute on chronic systolic (congestive) heart failure: Secondary | ICD-10-CM | POA: Diagnosis not present

## 2024-12-23 LAB — CBC
HCT: 29.8 % — ABNORMAL LOW (ref 39.0–52.0)
Hemoglobin: 8.5 g/dL — ABNORMAL LOW (ref 13.0–17.0)
MCH: 22.9 pg — ABNORMAL LOW (ref 26.0–34.0)
MCHC: 28.5 g/dL — ABNORMAL LOW (ref 30.0–36.0)
MCV: 80.3 fL (ref 80.0–100.0)
Platelets: 299 K/uL (ref 150–400)
RBC: 3.71 MIL/uL — ABNORMAL LOW (ref 4.22–5.81)
RDW: 27.2 % — ABNORMAL HIGH (ref 11.5–15.5)
WBC: 18 K/uL — ABNORMAL HIGH (ref 4.0–10.5)
nRBC: 0.1 % (ref 0.0–0.2)

## 2024-12-23 LAB — RENAL FUNCTION PANEL
Albumin: 3.1 g/dL — ABNORMAL LOW (ref 3.5–5.0)
Anion gap: 11 (ref 5–15)
BUN: 20 mg/dL (ref 6–20)
CO2: 25 mmol/L (ref 22–32)
Calcium: 8.4 mg/dL — ABNORMAL LOW (ref 8.9–10.3)
Chloride: 98 mmol/L (ref 98–111)
Creatinine, Ser: 1.05 mg/dL (ref 0.61–1.24)
GFR, Estimated: >60 mL/min
Glucose, Bld: 90 mg/dL (ref 70–99)
Phosphorus: 2.5 mg/dL (ref 2.5–4.6)
Potassium: 3.8 mmol/L (ref 3.5–5.1)
Sodium: 134 mmol/L — ABNORMAL LOW (ref 135–145)

## 2024-12-23 LAB — COOXEMETRY PANEL
Carboxyhemoglobin: 2.7 % — ABNORMAL HIGH (ref 0.5–1.5)
Methemoglobin: <0.7 % (ref 0.0–1.5)
O2 Saturation: 75 %
Total hemoglobin: 8.7 g/dL — ABNORMAL LOW (ref 12.0–16.0)

## 2024-12-23 LAB — HEPARIN LEVEL (UNFRACTIONATED): Heparin Unfractionated: >1.1 [IU]/mL — ABNORMAL HIGH (ref 0.30–0.70)

## 2024-12-23 LAB — APTT: aPTT: 68 s — ABNORMAL HIGH (ref 24–36)

## 2024-12-23 LAB — MAGNESIUM: Magnesium: 2.5 mg/dL — ABNORMAL HIGH (ref 1.7–2.4)

## 2024-12-23 LAB — GLUCOSE, CAPILLARY
Glucose-Capillary: 97 mg/dL (ref 70–99)
Glucose-Capillary: 93 mg/dL (ref 70–99)
Glucose-Capillary: 86 mg/dL (ref 70–99)

## 2024-12-23 MED ORDER — MIDAZOLAM-SODIUM CHLORIDE 100-0.9 MG/100ML-% IV SOLN
INTRAVENOUS | Status: AC
  Administered 2024-12-23: 2 mg/h via INTRAVENOUS
  Filled 2024-12-23: qty 100

## 2024-12-23 MED ORDER — GLYCOPYRROLATE 0.2 MG/ML IJ SOLN
0.2000 mg | INTRAMUSCULAR | Status: DC | PRN
  Administered 2024-12-23 (×2): 0.2 mg via INTRAVENOUS
  Filled 2024-12-23 (×2): qty 1

## 2024-12-23 MED ORDER — GLYCOPYRROLATE 0.2 MG/ML IJ SOLN: 0.2000 mg | INTRAMUSCULAR | Status: DC | PRN

## 2024-12-23 MED ORDER — LACTULOSE 10 GM/15ML PO SOLN: 20.0000 g | Freq: Three times a day (TID) | ORAL | Status: DC

## 2024-12-23 MED ORDER — MIDAZOLAM-SODIUM CHLORIDE 100-0.9 MG/100ML-% IV SOLN
0.0000 mg/h | INTRAVENOUS | Status: DC
  Administered 2024-12-23: 2 mg/h via INTRAVENOUS

## 2024-12-23 MED ORDER — ACETAMINOPHEN 160 MG/5ML PO SOLN: 650.0000 mg | Freq: Four times a day (QID) | ORAL | Status: DC | PRN

## 2024-12-23 MED ORDER — SODIUM CHLORIDE 0.9 % IV SOLN: INTRAVENOUS | Status: DC

## 2024-12-23 MED ORDER — BISACODYL 10 MG RE SUPP
10.0000 mg | Freq: Once | RECTAL | Status: DC
  Filled 2024-12-23: qty 1

## 2024-12-23 MED ORDER — MORPHINE SULFATE (PF) 2 MG/ML IV SOLN
2.0000 mg | INTRAVENOUS | Status: DC | PRN
  Administered 2024-12-23 (×2): 4 mg via INTRAVENOUS
  Administered 2024-12-23 (×2): 2 mg via INTRAVENOUS
  Filled 2024-12-23 (×3): qty 2

## 2024-12-23 MED ORDER — MIDAZOLAM BOLUS VIA INFUSION (WITHDRAWAL LIFE SUSTAINING TX): 2.0000 mg | INTRAVENOUS | Status: DC | PRN

## 2024-12-23 MED ORDER — MIDAZOLAM HCL (PF) 2 MG/2ML IJ SOLN: 1.0000 mg | INTRAMUSCULAR | Status: DC | PRN

## 2024-12-23 MED ORDER — GLYCOPYRROLATE 1 MG PO TABS: 1.0000 mg | ORAL_TABLET | ORAL | Status: DC | PRN

## 2024-12-23 MED ORDER — POLYVINYL ALCOHOL 1.4 % OP SOLN: 1.0000 [drp] | Freq: Four times a day (QID) | OPHTHALMIC | Status: DC | PRN

## 2024-12-23 MED ORDER — ACETAMINOPHEN 650 MG RE SUPP: 650.0000 mg | Freq: Four times a day (QID) | RECTAL | Status: DC | PRN

## 2024-12-23 MED ORDER — SENNA 8.6 MG PO TABS
2.0000 | ORAL_TABLET | Freq: Every day | ORAL | Status: DC
  Filled 2024-12-23: qty 2

## 2024-12-24 NOTE — Progress Notes (Signed)
" °   12/20/24 1806  What Happened  Was fall witnessed? No  Was patient injured? Unsure  Patient found on floor;in bathroom  Found by Staff-comment Billye NT Porter RN)  Stated prior activity bathroom-unassisted  Provider Notification  Provider Name/Title Dr. Sigurd Pac  Date Provider Notified 12/24/24  Time Provider Notified 1806  Method of Notification Face-to-face (met on 2H after patient coded)  Notification Reason Fall  Provider response At bedside  Date of Provider Response 12/24/24  Time of Provider Response 1806  Follow Up  Family notified Yes - comment (Critical care MD/APP notified family of code)  Additional tests Yes-comment (CT head)  Progress note created (see row info) Yes  Adult Fall Risk Assessment  Risk Factor Category (scoring not indicated) Fall has occurred during this admission (document High fall risk)  Patient Fall Risk Level High fall risk  Adult Fall Risk Interventions  Required Bundle Interventions *See Row Information* High fall risk  Vitals  BP (!) 151/89  Pulse Rate (!) 110    "

## 2024-12-26 ENCOUNTER — Telehealth: Payer: Self-pay

## 2024-12-26 LAB — CULTURE, BLOOD (ROUTINE X 2)
Culture: NO GROWTH
Culture: NO GROWTH

## 2024-12-26 NOTE — Telephone Encounter (Signed)
 Pt is deceased. NFN

## 2024-12-29 ENCOUNTER — Ambulatory Visit

## 2025-01-04 NOTE — Progress Notes (Signed)
 "  NAME:  Ryan [name], MRN:  [id number], DOB:  10-Feb-1966, LOS: 5 ADMISSION DATE:  12/18/2024, CONSULTATION DATE:  12/20/24 REFERRING MD: [facility], CHIEF COMPLAINT: Cardiac arrest  History of Present Illness:  Ryan [name] is a 58 year old male with a past medical history significant for combined CHF, emphysema, chronic respiratory failure, paroxysmal A-fib, HTN, HLD, prior CABG with CAD, PAD, and carotid artery disease who initially presented to the ED at [facility] 1/15 for worsening shortness of breath and edema.  Admission workup consistent with CHF exacerbation for which patient was admitted to [facility]  Late afternoon 1/17 patient suffered in-hospital cardiac arrest with estimated downtime of 16 minutes.  PCCM consulted for further assistance postarrest  Pertinent  Medical History  CHF, emphysema, chronic respiratory failure, paroxysmal A-fib, HTN, HLD, prior CABG with CAD, PAD, and carotid artery disease   Significant Hospital Events: Including procedures, antibiotic start and stop dates in addition to other pertinent events   1/15 presented with increased work of breathing and edema admitted for acute exacerbation CHF 1/17 suffered an hospital bradycardic to PEA cardiac arrest, estimated downtime 16 minutes 1/18 started CRRT for volume removal after unsuccessful diuretic challenge 1/19: cvEEG confirmed myoclonic seizures when sedation weaned. Specifically, abdominal jerking movements were seen in association with highly epileptiform bursts on cvEEG. Keppra  loaded per recommendations of neurology. GOC rediscussed with daughter in light of these findings. Plan to gather family on 01-16-2025. CTX started for thick ETT secretions c/f aspiration pneumonia i/s/o recent cardiac arrest.  Interim History / Subjective:  Propofol  was restarted overnight due to clinical signs of seizures as witnessed by staff and family. cvEEG appears largely unchanged.  Objective    Blood pressure (!) 106/50, pulse 74,  temperature 98.8 F (37.1 C), resp. rate (!) 22, height 5' 9 (1.753 m), weight 79.8 kg, SpO2 100%. CVP:  [9 mmHg-28 mmHg] 21 mmHg  Vent Mode: PRVC FiO2 (%):  [40 %] 40 % Set Rate:  [22 bmp] 22 bmp Vt Set:  [560 mL] 560 mL PEEP:  [5 cmH20] 5 cmH20 Plateau Pressure:  [17 cmH20-23 cmH20] 23 cmH20   Intake/Output Summary (Last 24 hours) at January 16, 2025 1056 Last data filed at 2025/01/16 1000 Gross per 24 hour  Intake 1999.76 ml  Output 5876 ml  Net -3876.24 ml   Filed Weights   12/21/24 0500 12/22/24 0500 01/16/25 0533  Weight: 90.7 kg 77.9 kg 79.8 kg    Examination: General: Acute on chronic ill-appearing middle-age male lying in bed on mechanical ventilation HEENT: ETT, MM pink/moist, PERRL,  Neuro: Unresponsive, +cough, sluggishly reactive pupils -- currently on propofol  but exam has been same when propofol  is off, with exception that clinical seizure activity is more prominent. Stereotyped movements of jaw and b/l lower extremities, and ongoing abdominal jerks albeit perhaps somewhat less pronounced than prior to Keppra  administration. CV: s1s2 regular rate and rhythm, no murmur, rubs, or gallops,  PULM: Mechanical breath sounds b/l GI: soft albeit moderately distended Extremities: warm/dry, 2+ pitting edema up to level of sacrum. Minimal to no UE edema.  Skin: no rashes or lesions   Resolved problem list   Assessment and Plan  In-hospital cardiac arrest Post-arrest Myoclonic Seizures Presumed Anoxic Brain Injury PEA arrest. Estimated duration of CPR: 16 minutes. NCHCT without acute abnormality post-arrest but now with no recovery of mental status post-arrest and with ongoing post-arrest myoclonic seizures. P: - cvEEG ongoing. Keppra . Neuro following and agree that myoclonic seizures suggest / portend a worse prognosis, however full neuro-prognostication, if c/w  patient's goal of care, would necessitate a long duration of life support and observation. Family has indicated that  they do not think this is what the patient would want. This is, in part, informed by his daughter's prior conversations with him about life support, and her understanding regarding the severity of his underlying medical/cardiac conditions that caused his arrest (which in and of themselves continue to be critical, life-threatening and chronic in nature). - I have explained to the patient's daughter that, if helpful in terms of them making a decision about his care, an MRI at 72+ hrs post-arrest (any time from 08-Jan-2025 ~ 7 pm onwards) can be obtained. However, I also explained that an MRI only informs prognosis (in a negative manner) when it shows significant abnormalities. A normal MRI is prognostically neutral (rather than reassuring). - Prevent fever, hypoxemia in post-arrest setting. - Treat underlying cause of arrest (severe biventricular failure, esp RV failure, in setting of massive volume overload).  Acute on chronic combined systolic congestive heart failure - Echo 1/17 with EF 35 to 40%, no observed apical thrombus on this echo there was concern for 1 day prior, RV function severely reduced CAD s/p CABG P: Primary management per heart failure CRRT for extensive ultrafiltration / volume removal. Maximize UF rate as tolerated until CVP ~ 12, at which point will scale back rate of UF  Acute on chronic hypoxic respiratory failure in the setting of cardiac arrest Aspiration Pneumonia History of COPD -Intubated during arrest  P: Continue ventilator support with lung protective strategies  Wean PEEP and FiO2 for sats greater than 90%. Head of bed elevated 30 degrees. Plateau pressures less than 30 cm H20.  Ensure adequate pulmonary hygiene  Follow cultures  VAP bundle in place  PAD protocol Ceftriaxone (D1=1/19) for aspiration pneumonia  Leukocytosis, Temp 99 No fever, no localizing sign of infection, no growth in cultures from 1/18 We are pursuing a goal of fever prevention for at least  48-72 hours post-arrest CTM closely. Leukocytosis most likely reactive as it is coming down today without antimicrobials  Atrial fibs/atrial fibrillation -S/p flutter ablation 12/25 P: Optimize electrolytes Continuous telemetry Heparin  drip in place of Eliquis  for now  PAD with carotid and femoral disease P: On Eliquis  at home. Heparin  drip here for now.  Type 2 diabetes P: Continue SSI CBG checks every 4  Hypertension Hyperlipidemia P: Hold home medications postarrest   Labs   CBC: Recent Labs  Lab 12/20/24 1844 12/20/24 1940 12/20/24 2015 12/21/24 0001 12/21/24 0348 12/21/24 0434 12/21/24 0657 12/22/24 0420 2025/01/08 0351  WBC 13.6*  --  22.6*  --   --  23.7*  --  17.9* 18.0*  HGB 6.5*   < > 8.0*   < > 10.9* 8.6* 10.9* 8.6* 8.5*  HCT 26.9*   < > 27.8*   < > 32.0* 29.4* 32.0* 29.8* 29.8*  MCV 92.8  --  78.1*  --   --  77.2*  --  78.0* 80.3  PLT 224  --  274  --   --  326  --  350 299   < > = values in this interval not displayed.    Basic Metabolic Panel: Recent Labs  Lab 12/19/24 0259 12/19/24 0301 12/20/24 0229 12/20/24 0230 12/21/24 0434 12/21/24 0657 12/21/24 1600 12/22/24 0420 12/22/24 1527 01/08/25 0351  NA 131*   < > 127*   < > 126* 126* 129* 131* 133* 134*  K 2.9*   < > 3.7   < >  4.6 4.6 4.7 4.1 4.4 3.8  CL 87*   < > 86*   < > 86*  --  90* 94* 97* 98  CO2 33*   < > 30   < > 24  --  25 25 26 25   GLUCOSE 225*   < > 127*   < > 131*  --  111* 78 75 90  BUN 48*   < > 43*   < > 47*  --  39* 30* 23* 20  CREATININE 1.51*   < > 1.63*   < > 2.00*  --  1.77* 1.42* 1.21 1.05  CALCIUM  8.4*   < > 8.4*   < > 8.9  --  8.6* 8.5* 8.4* 8.4*  MG 2.7*  --  2.6*  --  3.5*  --   --  2.6*  --  2.5*  PHOS  --    < >  --    < > 5.3*  --  4.5 3.3 3.2 2.5   < > = values in this interval not displayed.   GFR: Estimated Creatinine Clearance: 76.7 mL/min (by C-G formula based on SCr of 1.05 mg/dL). Recent Labs  Lab 12/20/24 2015 12/20/24 2024 12/21/24 0434  12/21/24 0522 12/21/24 1202 12/21/24 1722 12/21/24 2335 12/22/24 0420 12/22/24 0510 01/10/2025 0351  WBC 22.6*  --  23.7*  --   --   --   --  17.9*  --  18.0*  LATICACIDVEN  --    < >  --    < > 2.7* 1.5 1.3  --  1.1  --    < > = values in this interval not displayed.    Liver Function Tests: Recent Labs  Lab 12/19/24 0259 12/19/24 0301 12/20/24 1844 12/20/24 2015 12/21/24 0434 12/21/24 1600 12/22/24 0420 12/22/24 1527 01-10-25 0351  AST 26  --  29 28  --   --   --   --   --   ALT 15  --  10 14  --   --   --   --   --   ALKPHOS 379*  --  220* 278*  --   --   --   --   --   BILITOT 1.2  --  1.0 1.5*  --   --   --   --   --   PROT 7.2  --  5.5* 6.9  --   --   --   --   --   ALBUMIN  3.4*   < > 2.6* 3.3* 3.4* 3.5 3.3* 3.3* 3.1*   < > = values in this interval not displayed.   No results for input(s): LIPASE, AMYLASE in the last 168 hours. No results for input(s): AMMONIA in the last 168 hours.  ABG    Component Value Date/Time   PHART 7.406 12/21/2024 0657   PCO2ART 35.5 12/21/2024 0657   PO2ART 95 12/21/2024 0657   HCO3 22.5 12/21/2024 0657   TCO2 24 12/21/2024 0657   ACIDBASEDEF 2.0 12/21/2024 0657   O2SAT 75 January 10, 2025 0351     Coagulation Profile: Recent Labs  Lab 12/20/24 1844  INR 3.6*    Cardiac Enzymes: No results for input(s): CKTOTAL, CKMB, CKMBINDEX, TROPONINI in the last 168 hours.  HbA1C: Hgb A1c MFr Bld  Date/Time Value Ref Range Status  07/18/2024 06:18 AM 12.3 (H) 4.8 - 5.6 % Final    Comment:    (NOTE) Diagnosis of Diabetes The following HbA1c ranges recommended by  the American Diabetes Association (ADA) may be used as an aid in the diagnosis of diabetes mellitus.  Hemoglobin             Suggested A1C NGSP%              Diagnosis  <5.7                   Non Diabetic  5.7-6.4                Pre-Diabetic  >6.4                   Diabetic  <7.0                   Glycemic control for                       adults with  diabetes.    03/01/2024 10:37 AM 8.9 (H) 4.8 - 5.6 % Final    Comment:    (NOTE) Pre diabetes:          5.7%-6.4%  Diabetes:              >6.4%  Glycemic control for   <7.0% adults with diabetes     CBG: Recent Labs  Lab 12/22/24 2002 12/22/24 2303 01/19/25 0107 01/19/25 0324 2025/01/19 0737  GLUCAP 118* 105* 97 93 86    Review of Systems:   Unable to assess   Past Medical History:  He,  has a past medical history of Acute myocardial infarction of other anterior wall, subsequent episode of care (12/12/2011), Anal fissure, Arthritis, Atherosclerosis of native artery of both lower extremities with intermittent claudication (04/01/2018), Bruises easily, CAD (coronary artery disease), Cardiomyopathy secondary (12/26/2011), Chronic systolic CHF (congestive heart failure) (HCC), Chronic total occlusion of artery of the extremities (02/26/2012), Claudication (12/26/2011), Coronary atherosclerosis of native coronary artery, Diabetes mellitus without complication (HCC), Essential hypertension (10/23/2016), GERD (gastroesophageal reflux disease), History of blood transfusion, History of kidney stones, Hypertension, Ischemic cardiomyopathy, MI (myocardial infarction) (HCC), Mixed hyperlipidemia, NSTEMI (non-ST elevation myocardial infarction) (HCC) (03/26/2023), Numbness and tingling of right arm (03/31/2013), Occlusion and stenosis of carotid artery without mention of cerebral infarction (08/26/2012), PAD (peripheral artery disease), Paroxysmal atrial flutter (HCC) (07/20/2024), Peripheral vascular disease, unspecified (12/16/2012), PVD (peripheral vascular disease) (03/31/2013), Rectal polyp, ST elevation myocardial infarction involving left anterior descending (LAD) coronary artery (HCC) (07/31/2017), Subclavian steal syndrome (08/11/2013), Syncope (07/20/2024), Uncontrolled diabetes mellitus (10/23/2016), Unstable angina (HCC), and Wears partial dentures.   Surgical History:   Past Surgical  History:  Procedure Laterality Date   A-FLUTTER ABLATION N/A 05/05/2024   Procedure: A-FLUTTER ABLATION;  Surgeon: [name], MD;  Location: [facility] INVASIVE CV LAB;  Service: Cardiovascular;  Laterality: N/A;   ABDOMINAL AORTOGRAM W/LOWER EXTREMITY N/A 06/26/2017   Procedure: Abdominal Aortogram w/Lower Extremity;  Surgeon: Serene Gaile ORN, MD;  Location: [facility] INVASIVE CV LAB;  Service: Cardiovascular;  Laterality: N/A;   ABDOMINAL AORTOGRAM W/LOWER EXTREMITY N/A 06/28/2021   Procedure: ABDOMINAL AORTOGRAM W/LOWER EXTREMITY;  Surgeon: Serene Gaile ORN, MD;  Location: [facility] INVASIVE CV LAB;  Service: Cardiovascular;  Laterality: N/A;   ABDOMINAL AORTOGRAM W/LOWER EXTREMITY N/A 01/02/2023   Procedure: ABDOMINAL AORTOGRAM W/LOWER EXTREMITY;  Surgeon: Serene Gaile ORN, MD;  Location: [facility] INVASIVE CV LAB;  Service: Cardiovascular;  Laterality: N/A;   ANAL FISSURECTOMY     AORTA - BILATERAL FEMORAL ARTERY BYPASS GRAFT N/A 01/16/2013   Procedure: AORTA BIFEMORAL BYPASS GRAFT;  Surgeon: Gaile LELON New, MD;  Location: [facility] OR;  Service: Vascular;  Laterality: N/A;   APPENDECTOMY     ATRIAL FIBRILLATION ABLATION N/A 11/12/2024   Procedure: ATRIAL FIBRILLATION ABLATION;  Surgeon: [name], MD;  Location: [facility] INVASIVE CV LAB;  Service: Cardiovascular;  Laterality: N/A;   CARDIAC CATHETERIZATION N/A 10/23/2016   Procedure: Left Heart Cath and Coronary Angiography;  Surgeon: Lonni JONETTA Cash, MD;  Location: [facility] INVASIVE CV LAB;  Service: Cardiovascular;  Laterality: N/A;   CARDIAC CATHETERIZATION N/A 10/23/2016   Procedure: Coronary Balloon Angioplasty;  Surgeon: Lonni JONETTA Cash, MD;  Location: [facility] INVASIVE CV LAB;  Service: Cardiovascular;  Laterality: N/A;   CARDIOVERSION N/A 03/31/2024   Procedure: CARDIOVERSION;  Surgeon: Zenaida Morene PARAS, MD;  Location: [facility] INVASIVE CV LAB;  Service: Cardiovascular;  Laterality: N/A;   CAROTID-SUBCLAVIAN BYPASS GRAFT Left 04/03/2013   Procedure: BYPASS GRAFT  CAROTID-SUBCLAVIAN;  Surgeon: Gaile LELON New, MD;  Location: [facility] OR;  Service: Vascular;  Laterality: Left;   CIRCUMCISION N/A 12/15/2015   Procedure: CIRCUMCISION ADULT;  Surgeon: Belvie LITTIE Clara, MD;  Location: [facility] ORS;  Service: Urology;  Laterality: N/A;   COLONOSCOPY     CORONARY ANGIOPLASTY     stent placed Dec 12, 2011 and 2018   CORONARY ARTERY BYPASS GRAFT N/A 05/05/2020   Procedure: CORONARY ARTERY BYPASS GRAFTING (CABG) x Three, Using Left internal Mammary Artery and Left Leg greater saphenous vein harvested endoscopically;  Surgeon: Army Dallas NOVAK, MD;  Location: [facility] OR;  Service: Open Heart Surgery;  Laterality: N/A;   CORONARY BALLOON ANGIOPLASTY N/A 05/23/2019   Procedure: CORONARY BALLOON ANGIOPLASTY;  Surgeon: Cash Lonni JONETTA, MD;  Location: [facility] INVASIVE CV LAB;  Service: Cardiovascular;  Laterality: N/A;   CORONARY PRESSURE/FFR STUDY N/A 04/22/2020   Procedure: INTRAVASCULAR PRESSURE WIRE/FFR STUDY;  Surgeon: Cash Lonni JONETTA, MD;  Location: [facility] INVASIVE CV LAB;  Service: Cardiovascular;  Laterality: N/A;   DIAGNOSTIC LAPAROSCOPY     ENDARTERECTOMY FEMORAL Right 03/13/2018   Procedure: FEMORAL ENDARTERECTOMY RIGHT;  Surgeon: New Gaile LELON, MD;  Location: [facility] OR;  Service: Vascular;  Laterality: Right;   ENDARTERECTOMY FEMORAL Left 01/26/2023   Procedure: REDO LEFT COMMON FEMORAL ENDARTERECTOMY WITH VPA;  Surgeon: New Gaile LELON, MD;  Location: [facility] OR;  Service: Vascular;  Laterality: Left;   FEMORAL-POPLITEAL BYPASS GRAFT Right 03/13/2018   Procedure: FEMORAL-POPLITEAL ARTERY BYPASS WITH NON-REVERSE VEIN RIGHT;  Surgeon: New Gaile LELON, MD;  Location: [facility] OR;  Service: Vascular;  Laterality: Right;   GROIN DISSECTION Right 03/13/2018   Procedure: RE-DO COMMON FEMORAL ARTERY EXPOSURE;  Surgeon: New Gaile LELON, MD;  Location: [facility] OR;  Service: Vascular;  Laterality: Right;   I & D EXTREMITY Left 04/18/2013   Procedure: debridement of left neck lymphocele;  Surgeon:  Gaile LELON New, MD;  Location: [facility] OR;  Service: Vascular;  Laterality: Left;  I and D of left neck   I & D EXTREMITY Right 11/21/2014   Procedure: IRRIGATION AND DEBRIDEMENT RIGHT HAND;  Surgeon: Elsie Mussel, MD;  Location: [facility] OR;  Service: Orthopedics;  Laterality: Right;   LEFT HEART CATH AND CORONARY ANGIOGRAPHY N/A 05/23/2019   Procedure: LEFT HEART CATH AND CORONARY ANGIOGRAPHY;  Surgeon: Cash Lonni JONETTA, MD;  Location: [facility] INVASIVE CV LAB;  Service: Cardiovascular;  Laterality: N/A;   LEFT HEART CATH AND CORONARY ANGIOGRAPHY N/A 04/22/2020   Procedure: LEFT HEART CATH AND CORONARY ANGIOGRAPHY;  Surgeon: Cash Lonni JONETTA, MD;  Location: [facility] INVASIVE CV LAB;  Service: Cardiovascular;  Laterality: N/A;   LEFT HEART CATHETERIZATION WITH CORONARY ANGIOGRAM N/A 12/12/2011   Procedure: LEFT HEART CATHETERIZATION WITH CORONARY ANGIOGRAM;  Surgeon: Lonni JONETTA Cash, MD;  Location[facility] CATH LAB;  Service: Cardiovascular;  Laterality: N/A;   LOOP RECORDER INSERTION N/A 05/05/2024   Procedure: LOOP RECORDER INSERTION;  Surgeon: [name], MD;  Location: [facility] INVASIVE CV LAB;  Service: Cardiovascular;  Laterality: N/A;   LOWER EXTREMITY ANGIOGRAM N/A 01/31/2012   Procedure: LOWER EXTREMITY ANGIOGRAM;  Surgeon: Lonni JONETTA Cash, MD;  Location: [facility] CATH LAB;  Service: Cardiovascular;  Laterality: N/A;   LOWER EXTREMITY ANGIOGRAPHY N/A 04/23/2018   Procedure: LOWER EXTREMITY ANGIOGRAPHY;  Surgeon: Serene Gaile ORN, MD;  [facility] INVASIVE CV LAB;  Service: Cardiovascular;  Laterality: N/A;   PATCH ANGIOPLASTY Left 01/26/2023   Procedure: PATCH ANGIOPLASTY USING HEMASHIELD PLATINUM FINESSE;  Surgeon: Serene Gaile ORN, MD;  Location: [facility] OR;  Service: Vascular;  Laterality: Left;   PERCUTANEOUS CORONARY STENT INTERVENTION (PCI-S) Right 12/12/2011   Procedure: PERCUTANEOUS CORONARY STENT INTERVENTION (PCI-S);  Surgeon: Lonni JONETTA Cash, MD;  Location: [facility] CATH LAB;  Service:  Cardiovascular;  Laterality: Right;   PERIPHERAL VASCULAR BALLOON ANGIOPLASTY Right 06/28/2021   Procedure: PERIPHERAL VASCULAR BALLOON ANGIOPLASTY;  Surgeon: Serene Gaile ORN, MD;  [facility] INVASIVE CV LAB;  Service: Cardiovascular;  Laterality: Right;  common femoral (graft)   PERIPHERAL VASCULAR BALLOON ANGIOPLASTY  01/02/2023   Procedure: PERIPHERAL VASCULAR BALLOON ANGIOPLASTY;  Surgeon: Serene Gaile ORN, MD;  Location: [facility] INVASIVE CV LAB;  Service: Cardiovascular;;   PERIPHERAL VASCULAR INTERVENTION Left 04/23/2018   Procedure: PERIPHERAL VASCULAR INTERVENTION;  Surgeon: Serene Gaile ORN, MD;  Location: [facility] INVASIVE CV LAB;  Service: Cardiovascular;  Laterality: Left;  superficial femoral   REPAIR EXTENSOR TENDON Right 11/21/2014   Procedure: WITH REPAIR/RECONSTRUCTION OF EXTENSOR TENDONS AS NEEDED;  Surgeon: Elsie Mussel, MD;  Location: [facility] OR;  Service: Orthopedics;  Laterality: Right;   TEE WITHOUT CARDIOVERSION N/A 05/05/2020   Procedure: TRANSESOPHAGEAL ECHOCARDIOGRAM (TEE);  Surgeon: Army Dallas NOVAK, MD;  Location: [facility] OR;  Service: Open Heart Surgery;  Laterality: N/A;     Social History:   reports that he has quit smoking. His smoking use included cigarettes. He has a 6.3 pack-year smoking history. He has never used smokeless tobacco. He reports current alcohol  use of about 2.0 standard drinks of alcohol  per week. He reports that he does not use drugs.   Family History:  His family history includes COPD in his mother; Cancer in his father and maternal uncle; Diabetes in his mother.   Allergies Allergies[1]   Home Medications  Prior to Admission medications  Medication Sig Start Date End Date Taking? Authorizing Provider  albuterol  (VENTOLIN  HFA) 108 (90 Base) MCG/ACT inhaler Inhale 1-2 puffs into the lungs every 6 (six) hours as needed for wheezing or shortness of breath.   Yes [provider]  cetirizine (ZYRTEC) 10 MG tablet Take 10 mg by mouth daily as needed for  allergies. 06/02/21  Yes [provider]  clopidogrel  (PLAVIX ) 75 MG tablet TAKE 1 TABLET BY MOUTH EVERY DAY 09/01/24  Yes Cash Lonni JONETTA, MD  diazepam  (VALIUM ) 10 MG tablet Take 10 mg by mouth at bedtime. 03/10/21  Yes [provider]  ELIQUIS  5 MG TABS tablet TAKE 1 TABLET BY MOUTH 2 TIMES A DAY 09/29/24  Yes [name], MD  ezetimibe  (ZETIA ) 10 MG tablet TAKE 1 TABLET BY MOUTH EACH DAY 03/07/24  Yes Weaver, Scott T, PA-C  Furosemide  (FUROSCIX ) 80  MG/10ML CTKT Inject 80 mg into the skin once daily as directed. 12/15/24  Yes Milford, [name], FNP  gabapentin  (NEURONTIN ) 300 MG capsule Take 300 mg by mouth 3 (three) times daily. 03/25/20  Yes [provider]  icosapent  Ethyl (VASCEPA ) 1 g capsule TAKE 2 CAPSULES BY MOUTH TWICE DAILY 05/30/24  Yes Lelon Hamilton T, PA-C  insulin  glargine (LANTUS ) 100 UNIT/ML Solostar Pen Inject 50 Units into the skin 2 (two) times daily.   Yes [provider]  insulin  lispro (HUMALOG ) 100 UNIT/ML injection Inject 0-15 Units into the skin 3 (three) times daily as needed for high blood sugar.   Yes [provider]  methocarbamol  (ROBAXIN ) 500 MG tablet Take 500 mg by mouth every 6 (six) hours as needed for muscle spasms. 11/05/24  Yes [provider]  nitroGLYCERIN  (NITROSTAT ) 0.4 MG SL tablet Place 0.4 mg under the tongue every 5 (five) minutes as needed for chest pain.   Yes [provider]  oxyCODONE -acetaminophen  (PERCOCET) 10-325 MG tablet Take 1 tablet by mouth 4 (four) times daily.   Yes [provider]  pantoprazole  (PROTONIX ) 40 MG tablet Take 40 mg by mouth daily. 03/07/24  Yes [provider]  potassium chloride  SA (KLOR-CON  M) 20 MEQ tablet Take 3 tablets (60 mEq total) by mouth 2 (two) times daily. Take an extra 2 tablets (40 meq total) when you take Metolazone . 12/01/24  Yes Milford, [name], FNP  pseudoephedrine (SUDAFED) 30 MG tablet Take 30 mg by mouth every 4 (four)  hours as needed for congestion.   Yes [provider]  ramelteon  (ROZEREM ) 8 MG tablet TAKE 1 TABLET (8 MG TOTAL) BY MOUTH AT BEDTIME. 12/01/24  Yes Olalere, Adewale A, MD  REPATHA  SURECLICK 140 MG/ML SOAJ INJECT 140 MG INTO THE SKIN EVERY 14 DAYS AS DIRECTED 05/30/24  Yes [name], MD  Tiotropium Bromide-Olodaterol (STIOLTO RESPIMAT ) 2.5-2.5 MCG/ACT AERS INHALE 2 PUFFS INTO THE LUNGS DAILY. 12/01/24  Yes Olalere, Adewale A, MD  torsemide  (DEMADEX ) 20 MG tablet Take 4 tablets (80 mg total) by mouth 2 (two) times daily. 11/28/24  Yes Clegg, [name], NP  traZODone  (DESYREL ) 100 MG tablet Take 100 mg by mouth at bedtime.   Yes [provider]  TRINTELLIX  20 MG TABS tablet Take 20 mg by mouth daily in the afternoon. 12/29/22  Yes [provider]  ACCU-CHEK GUIDE TEST test strip  12/01/24   [provider]  ADVOCATE INSULIN  SYRINGE 31G X 5/16 1 ML MISC  12/01/24   [provider]  GNP PEN NEEDLES 32G X 4 MM MISC  12/01/24   [provider]  metolazone  (ZAROXOLYN ) 5 MG tablet Take 1 tablet (5 mg total) by mouth daily. Patient not taking: Reported on 12/18/2024 12/15/24   Glena [name], FNP  OXYGEN Inhale 2 L into the lungs as needed (excessive movement).    [provider]     Critical care time:    Total critical care time spent by me: 45 minutes   Critical care time was exclusive of separately billable procedures and the treatment of any other patient.   Critical care was necessary to treat or prevent imminent or life-threatening deterioration.  Critical care was time spent personally by me on the following activities: development of treatment plan with patient and/or surrogate as well as nursing, discussions with consultants, re-evaluation of the patient's condition and their response to treatment, examination of patient, obtaining history from patient or surrogate, ordering and performing treatments  and interventions, ordering  and review of laboratory studies, ordering and review of radiographic studies, and participation in multidisciplinary rounds.  Lamar Dales, MD Pulmonary, Critical Care & Sleep Medicine [facility]  7a-7p: For contact information, see AMION. If no response to pager, please call PCCM 2-H APP. After 7p: Please call PCCM APP on-call for 2-H.          [1]  Allergies Allergen Reactions   Metformin Anaphylaxis    Water  blisters between fingers   Hydrocodone  Hives and Nausea And Vomiting   "

## 2025-01-04 NOTE — Discharge Summary (Signed)
 Physician Discharge Summary  Patient ID: Ryan [name] MRN: [id number] DOB/AGE: 01/15/1966 59 y.o.  Admit date: 12/18/2024 Discharge date: 12/25/2024  Admission Diagnoses: Acute on Chronic Decompensated Systolic Heart Failure   Discharge Diagnoses:  Principal Problem:   Acute on chronic combined systolic and diastolic CHF (congestive heart failure) (HCC) Active Problems:   CAD (coronary artery disease)   Type 2 diabetes mellitus with complication, with long-term current use of insulin  (HCC)   PAD (peripheral artery disease)   Carotid artery disease   PVD (peripheral vascular disease)   Essential hypertension   S/P CABG x 3   Centrilobular emphysema (HCC)   Paroxysmal atrial fibrillation (HCC)   Chronic kidney disease, stage 3a (HCC)   Obesity, class 1   Iron  deficiency anemia   Chronic respiratory failure with hypoxia (HCC)   Hyperlipidemia   Cardiac arrest (HCC)   Malnutrition of moderate degree    Discharged Condition: Expired  Hospital Course: 38 M with severe biventricular systolic heart failure who was admitted 1/15 with acute on chronic systolic heart failure and severe volume overload. He had a PEA arrest on 1/17 -> 19 mins of CPR. Did not regain consciousness following ROSC. Did not respond to diuretic challenge post-ROSC so he was started on CRRT on 1/18. Observed to have myoclonic seizures clinically (and confirmed on cvEEG). Did not respond to Keppra  loading. Following goals of care conversation with the patient's daughter (next-of-kin), the patient was transitioned to comfort measures on 12/30/24 with daughter and other family at bedside.  Consults: cardiology, nephrology, and neurology  Significant Diagnostic Studies: N/A  Discharge Exam: Blood pressure (!) 106/50, pulse (!) 25, temperature 100 F (37.8 C), resp. rate (!) 39, height 5' 9 (1.753 m), weight 79.8 kg, SpO2 (!) 40%.   Disposition: Expired   Signed: Lamar JINNY Dales 12/25/2024, 11:09 PM

## 2025-01-04 NOTE — Plan of Care (Signed)
 Plan to withdraw on pt this afternoon & initiate comfort measures  Problem: Education: Goal: Ability to describe self-care measures that may prevent or decrease complications (Diabetes Survival Skills Education) will improve Outcome: Not Applicable Goal: Individualized Educational Video(s) Outcome: Not Applicable   Problem: Coping: Goal: Ability to adjust to condition or change in health will improve Outcome: Not Applicable   Problem: Fluid Volume: Goal: Ability to maintain a balanced intake and output will improve Outcome: Not Applicable   Problem: Health Behavior/Discharge Planning: Goal: Ability to identify and utilize available resources and services will improve Outcome: Not Applicable Goal: Ability to manage health-related needs will improve Outcome: Not Applicable   Problem: Metabolic: Goal: Ability to maintain appropriate glucose levels will improve Outcome: Not Applicable   Problem: Nutritional: Goal: Maintenance of adequate nutrition will improve Outcome: Not Applicable Goal: Progress toward achieving an optimal weight will improve Outcome: Not Applicable   Problem: Skin Integrity: Goal: Risk for impaired skin integrity will decrease Outcome: Not Applicable   Problem: Tissue Perfusion: Goal: Adequacy of tissue perfusion will improve Outcome: Not Applicable   Problem: Education: Goal: Knowledge of General Education information will improve Description: Including pain rating scale, medication(s)/side effects and non-pharmacologic comfort measures Outcome: Not Applicable   Problem: Health Behavior/Discharge Planning: Goal: Ability to manage health-related needs will improve Outcome: Not Applicable   Problem: Clinical Measurements: Goal: Ability to maintain clinical measurements within normal limits will improve Outcome: Not Applicable Goal: Will remain free from infection Outcome: Not Applicable Goal: Diagnostic test results will improve Outcome: Not  Applicable Goal: Respiratory complications will improve Outcome: Not Applicable Goal: Cardiovascular complication will be avoided Outcome: Not Applicable   Problem: Activity: Goal: Risk for activity intolerance will decrease Outcome: Not Applicable   Problem: Nutrition: Goal: Adequate nutrition will be maintained Outcome: Not Applicable   Problem: Coping: Goal: Level of anxiety will decrease Outcome: Not Applicable   Problem: Elimination: Goal: Will not experience complications related to bowel motility Outcome: Not Applicable Goal: Will not experience complications related to urinary retention Outcome: Not Applicable   Problem: Pain Managment: Goal: General experience of comfort will improve and/or be controlled Outcome: Not Applicable   Problem: Safety: Goal: Ability to remain free from injury will improve Outcome: Not Applicable   Problem: Skin Integrity: Goal: Risk for impaired skin integrity will decrease Outcome: Not Applicable   Problem: Education: Goal: Ability to demonstrate management of disease process will improve Outcome: Not Applicable Goal: Ability to verbalize understanding of medication therapies will improve Outcome: Not Applicable Goal: Individualized Educational Video(s) Outcome: Not Applicable   Problem: Activity: Goal: Capacity to carry out activities will improve Outcome: Not Applicable   Problem: Education: Goal: Knowledge of General Education information will improve Description: Including pain rating scale, medication(s)/side effects and non-pharmacologic comfort measures Outcome: Not Applicable   Problem: Health Behavior/Discharge Planning: Goal: Ability to manage health-related needs will improve Outcome: Not Applicable   Problem: Clinical Measurements: Goal: Ability to maintain clinical measurements within normal limits will improve Outcome: Not Applicable Goal: Will remain free from infection Outcome: Not Applicable Goal:  Diagnostic test results will improve Outcome: Not Applicable Goal: Respiratory complications will improve Outcome: Not Applicable Goal: Cardiovascular complication will be avoided Outcome: Not Applicable   Problem: Activity: Goal: Risk for activity intolerance will decrease Outcome: Not Applicable   Problem: Nutrition: Goal: Adequate nutrition will be maintained Outcome: Not Applicable   Problem: Coping: Goal: Level of anxiety will decrease Outcome: Not Applicable   Problem: Elimination: Goal: Will not experience complications  related to bowel motility Outcome: Not Applicable Goal: Will not experience complications related to urinary retention Outcome: Not Applicable   Problem: Pain Managment: Goal: General experience of comfort will improve and/or be controlled Outcome: Not Applicable   Problem: Safety: Goal: Ability to remain free from injury will improve Outcome: Not Applicable   Problem: Skin Integrity: Goal: Risk for impaired skin integrity will decrease Outcome: Not Applicable

## 2025-01-04 NOTE — Progress Notes (Signed)
 "    Advanced Heart Failure Rounding Note  Cardiologist: [name], MD  [facility] Cardiologist: Dr. Zenaida Chief Complaint: a/c systolic heart failure  Patient Profile   Ryan [name] is a 59 y.o. male with a PMH of CAD s/p multiple PCIs and CABG, chronic systolic heart failure d/t ischemic CM, AFL s/p AFL ablation, PAD, HLD, LV apical thrombus, carotid artery disease and tobacco use, admitted w/ a/c CHF in the setting of dietary indiscretion. Failed attempts at outpatient diuresis.   Significant events:   1/15: placed lasix  gtt @20 /hr + Diamox  1/17: PEA arrest with CPR. Moved to ICU 1/18: CRRT started 1/19: LT EEG with highly epileptiform bursts. Keppra  started.   Subjective:    Intubated/sedated on CRRT.   Remains on Epi @ 8.   Plan to transition to comfort care today.   Objective:   Weight Range: 79.8 kg Body mass index is 25.98 kg/m.   Vital Signs:   Temp:  [97.5 F (36.4 C)-99.9 F (37.7 C)] 97.9 F (36.6 C) January 03, 2025 0700) Pulse Rate:  [60-88] 66 January 03, 2025 0700) Resp:  [0-27] 22 Jan 03, 2025 0700) BP: (101-109)/(50-60) 106/50 (01/19 1810) SpO2:  [95 %-100 %] 100 % 03-Jan-2025 0700) Arterial Line BP: (90-121)/(46-59) 100/48 2025/01/03 0700) FiO2 (%):  [40 %] 40 % 01-03-2025 0533) Weight:  [79.8 kg] 79.8 kg January 03, 2025 0533) Last BM Date : 12/17/24  Weight change: Filed Weights   12/21/24 0500 12/22/24 0500 2025-01-03 0533  Weight: 90.7 kg 77.9 kg 79.8 kg    Intake/Output:   Intake/Output Summary (Last 24 hours) at 01-03-2025 0756 Last data filed at 01/03/25 0700 Gross per 24 hour  Intake 2160.28 ml  Output 6214 ml  Net -4053.72 ml     Physical Exam   General:  intubated and sedated Neck: JVD UTA.  Cor: Regular rate & rhythm. No murmurs. Lungs: diminished Extremities: +2 BLE edema   Telemetry   NSR 70s 2-4 PVCs (Personally reviewed)    Labs   CBC Recent Labs    12/22/24 0420 Jan 03, 2025 0351  WBC 17.9* 18.0*  HGB 8.6* 8.5*  HCT 29.8* 29.8*  MCV 78.0* 80.3   PLT 350 299   Basic Metabolic Panel Recent Labs    98/80/73 0420 12/22/24 1527 01/03/25 0351  NA 131* 133* 134*  K 4.1 4.4 3.8  CL 94* 97* 98  CO2 25 26 25   GLUCOSE 78 75 90  BUN 30* 23* 20  CREATININE 1.42* 1.21 1.05  CALCIUM  8.5* 8.4* 8.4*  MG 2.6*  --  2.5*  PHOS 3.3 3.2 2.5   Liver Function Tests Recent Labs    12/20/24 1844 12/20/24 2015 12/21/24 0434 12/22/24 1527 2025/01/03 0351  AST 29 28  --   --   --   ALT 10 14  --   --   --   ALKPHOS 220* 278*  --   --   --   BILITOT 1.0 1.5*  --   --   --   PROT 5.5* 6.9  --   --   --   ALBUMIN  2.6* 3.3*   < > 3.3* 3.1*   < > = values in this interval not displayed.   No results for input(s): LIPASE, AMYLASE in the last 72 hours. Cardiac Enzymes No results for input(s): CKTOTAL, CKMB, CKMBINDEX, TROPONINI in the last 72 hours.  BNP: BNP (last 3 results) Recent Labs    07/29/24 0856 11/13/24 1242 11/17/24 1436  BNP 1,679.4* 1,211.2* 1,251.0*    ProBNP (last 3  results) Recent Labs    12/18/24 1237  PROBNP 3,670.0*     D-Dimer No results for input(s): DDIMER in the last 72 hours. Hemoglobin A1C No results for input(s): HGBA1C in the last 72 hours. Fasting Lipid Panel Recent Labs    12/22/24 0420  TRIG 75   Medications:   Scheduled Medications:  amiodarone   400 mg Per Tube Daily   arformoterol   15 mcg Nebulization BID   And   umeclidinium bromide   1 puff Inhalation Daily   bisacodyl   10 mg Rectal Once   Chlorhexidine  Gluconate Cloth  6 each Topical Daily   clopidogrel   75 mg Per Tube Daily   docusate  100 mg Per Tube Daily   ezetimibe   10 mg Per Tube Daily   feeding supplement (PROSource TF20)  60 mL Per Tube TID   insulin  aspart  0-15 Units Subcutaneous Q4H   lactulose   20 g Per Tube TID   levETIRAcetam   1,000 mg Intravenous Q12H   multivitamin  1 tablet Per Tube QHS   mouth rinse  15 mL Mouth Rinse Q2H   pantoprazole  (PROTONIX ) IV  40 mg Intravenous QHS   polyethylene  glycol  17 g Per Tube BID   senna  2 tablet Per Tube Daily   sodium chloride  flush  3 mL Intravenous Q12H   sodium chloride  HYPERTONIC  4 mL Nebulization Q6H   thiamine   100 mg Per Tube Daily    Infusions:  epinephrine  8 mcg/min (01/17/2025 0700)   feeding supplement (VITAL 1.5 CAL) Stopped (12/22/24 2030)   fentaNYL  infusion INTRAVENOUS 25 mcg/hr (Jan 17, 2025 0700)   heparin  1,400 Units/hr (01-17-2025 0700)   piperacillin -tazobactam Stopped (01/17/2025 0533)   prismasol  BGK 4/2.5 400 mL/hr at 12/22/24 2307   prismasol  BGK 4/2.5 400 mL/hr at 12/22/24 2307   prismasol  BGK 4/2.5 1,500 mL/hr at 2025/01/17 9493   propofol  (DIPRIVAN ) infusion 25 mcg/kg/min (2025/01/17 0700)    PRN Medications: acetaminophen  **OR** acetaminophen , albuterol , fentaNYL  (SUBLIMAZE ) injection, heparin , midazolam  PF, mineral oil-hydrophilic petrolatum , mouth rinse, oxyCODONE -acetaminophen  **AND** oxyCODONE , sodium chloride , sodium chloride  flush  Assessment/Plan   PEA arrest - on 1/17 - now intubated/sedated  - on Epi  - With progressive RV dysfunction, suspect due to volume overload, acute on chronic ACC/AHA stage D cardiomyopathy - Neurology following for seizure like activity. Initial spot EEG with no seizures noted. Persistent abd jerking noted on sedation wean so long-term EEG placed. Now with highly epileptiform bursts. Has been loaded with Keppra . - Plan for comfort measures today.   Acute on chronic Systolic Heart failure with biventricular failure and cardiogenic shock: Ischemic related with recent worsening in the setting of atrial flutter.  - S/p flutter ablation, though having rare episodes of fib.   - NYHA IV. Has failed outpatient diuretic titration. Admitted with marked volume overload. End stage disease - Echo 1/17 EF 35-40% RV severely reduced  - Now on epi for BP support - Coox up to 75 this morning, continue to wean epi  - Volume management with CRRT - Not candidate for mechanical support or advanced  therapies - Neuro prognostication poor, plan for comfort measures later today  AFL/AFib: Recurrent, s/p atrial flutter ablation with Dr. Kennyth 6/25. Loop recorder implanted to evaluate for atrial fibrillation. - s/p AF ablation 12/25. - Continue amiodarone  400mg  daily - in NSR with PVCs - Continue heparin  gtt  AKI - due to ATN from shock/cardiac arrest - now on CRRT  CAD: - Continue Plavix  & apixaban  - Continue atorvastatin , repatha ,  LDL at goal.   PAD: Extensive history, carotid as well as femoral disease.  - Not a candidate for advanced therapies - Continue eliquis , plavix , aggressive risk factor modification   DM: - Uncontrolled.  - Last A1c 12.5 8/25 - He is on insulin  - Off SGLT2i  Hypokalemia: - supp as needed  Plan for comfort measures today.   CRITICAL CARE Performed by: Beckey LITTIE Coe   Total critical care time: 10 minutes  Critical care time was exclusive of separately billable procedures and treating other patients.  Critical care was necessary to treat or prevent imminent or life-threatening deterioration.  Critical care was time spent personally by me on the following activities: development of treatment plan with patient and/or surrogate as well as nursing, discussions with consultants, evaluation of patient's response to treatment, examination of patient, obtaining history from patient or surrogate, ordering and performing treatments and interventions, ordering and review of laboratory studies, ordering and review of radiographic studies, pulse oximetry and re-evaluation of patient's condition.  Length of Stay: 5  Beckey LITTIE Coe, NP  2025/01/14, 7:56 AM  Advanced Heart Failure Team Pager [phone] (M-F; 7a - 5p)   Please visit [email or web address]: For overnight coverage please call cardiology fellow first. If fellow not available call Shock/ECMO MD on call.  For ECMO / Mechanical Support (Impella, IABP, LVAD) issues call Shock / ECMO MD on call.   "

## 2025-01-04 NOTE — Progress Notes (Signed)
 Event Note  Family has gathered at bedside. Daughter (next-of-kin) is present and expresses readiness to transition to comfort-measures only at this time.  She understands this will include removal of all life-sustaining measures including removal of the patient's endotracheal tube.  I expressed that it is difficult to predict how long he may live following terminal extubation and removal of life support, though a range of minutes to several hours is most likely.  Code status changed to DNI / DNR / comfort measures only.  Lamar JINNY Dales, MD

## 2025-01-04 NOTE — Progress Notes (Signed)
 PHARMACY - ANTICOAGULATION CONSULT NOTE  Pharmacy Consult for heparin  Indication: atrial fibrillation  Allergies[1]  Patient Measurements: Height: 5' 9 (175.3 cm) Weight: 79.8 kg (175 lb 14.8 oz) IBW/kg (Calculated) : 70.7 HEPARIN  DW (KG): 79.8  Vital Signs: Temp: 97.9 F (36.6 C) 01-21-2025 0700) Temp Source: Bladder (01/19 2000) Pulse Rate: 66 Jan 21, 2025 0700)  Labs: Recent Labs    12/20/24 1844 12/20/24 1940 12/21/24 0434 12/21/24 0657 12/21/24 1600 12/21/24 1958 12/22/24 0420 12/22/24 1126 12/22/24 1527 Jan 21, 2025 0349 2025/01/21 0351  HGB 6.5*   < > 8.6* 10.9*  --   --  8.6*  --   --   --  8.5*  HCT 26.9*   < > 29.4* 32.0*  --   --  29.8*  --   --   --  29.8*  PLT 224   < > 326  --   --   --  350  --   --   --  299  APTT  --   --   --   --    < > 64* 96* 89*  --   --  68*  LABPROT 37.8*  --   --   --   --   --   --   --   --   --   --   INR 3.6*  --   --   --   --   --   --   --   --   --   --   HEPARINUNFRC  --   --   --   --   --  >1.10* >1.10*  --   --  >1.10*  --   CREATININE 1.53*   < > 2.00*  --    < >  --  1.42*  --  1.21  --  1.05   < > = values in this interval not displayed.    Estimated Creatinine Clearance: 76.7 mL/min (by C-G formula based on SCr of 1.05 mg/dL).   Medical History: Past Medical History:  Diagnosis Date   Acute myocardial infarction of other anterior wall, subsequent episode of care 12/12/2011   Anal fissure    Arthritis    back   Atherosclerosis of native artery of both lower extremities with intermittent claudication 04/01/2018   Bruises easily    d/t being on Effient    CAD (coronary artery disease)    A. Acute Ant STEMI 12/12/2011    Cardiomyopathy secondary 12/26/2011   Chronic systolic CHF (congestive heart failure) (HCC)    ischemic CM // Echo 6/21: EF 30-35  //  Echocardiogram 10/21: LV apical thrombus resolved, EF 50-55, ant and apical, ant-lat HK, trivial MR, trivial AI   Chronic total occlusion of artery of the extremities  02/26/2012   Claudication 12/26/2011   Coronary atherosclerosis of native coronary artery    a. ant STEMI with cardiac arrest 2013 s/p DES to mLAD. b. [place]  10/2016 s/p PTCA to mLAD.   Diabetes mellitus without complication (HCC)    Essential hypertension 10/23/2016   GERD (gastroesophageal reflux disease)    takes tums   History of blood transfusion    no abnormal reaction noted   History of kidney stones    Hypertension    Ischemic cardiomyopathy    a. EF 35% in 02/2012 at time of acute MI, improved to normal on subsequent imaging.   MI (myocardial infarction) (HCC)    AMI 1/13 - complicated by VT/Tosades   Mixed hyperlipidemia  NSTEMI (non-ST elevation myocardial infarction) (HCC) 03/26/2023   Numbness and tingling of right arm 03/31/2013   Occlusion and stenosis of carotid artery without mention of cerebral infarction 08/26/2012   PAD (peripheral artery disease)    followed by Vascular - aortobifem bypass 01/2013 & left carotid-subclavian artery bypass 04/2013   Paroxysmal atrial flutter (HCC) 07/20/2024   Peripheral vascular disease, unspecified 12/16/2012   PVD (peripheral vascular disease) 03/31/2013   Rectal polyp    ST elevation myocardial infarction involving left anterior descending (LAD) coronary artery (HCC) 07/31/2017   Subclavian steal syndrome 08/11/2013   Syncope 07/20/2024   Uncontrolled diabetes mellitus 10/23/2016   Unstable angina (HCC)    Wears partial dentures    upper    Medications:  Scheduled:   amiodarone   400 mg Per Tube Daily   arformoterol   15 mcg Nebulization BID   And   umeclidinium bromide   1 puff Inhalation Daily   Chlorhexidine  Gluconate Cloth  6 each Topical Daily   clopidogrel   75 mg Per Tube Daily   docusate  100 mg Per Tube Daily   ezetimibe   10 mg Per Tube Daily   feeding supplement (PROSource TF20)  60 mL Per Tube TID   insulin  aspart  0-15 Units Subcutaneous Q4H   levETIRAcetam   1,000 mg Intravenous Q12H   multivitamin  1 tablet  Per Tube QHS   mouth rinse  15 mL Mouth Rinse Q2H   pantoprazole  (PROTONIX ) IV  40 mg Intravenous QHS   polyethylene glycol  17 g Per Tube BID   sodium chloride  flush  3 mL Intravenous Q12H   sodium chloride  HYPERTONIC  4 mL Nebulization Q6H   thiamine   100 mg Per Tube Daily    Assessment: 58 YOM admitted on 12/19/23 now s/p PEA arrest. Patient fell in bathroom, head CT unremarkable. Patient on Eliquis  PTA for history of atrial flutter s/p ablation. Patient started on Eliquis  earlier this admission, last dose administered on 12/20/24 at 1030. Pharmacy consulted for heparin  dosing.   aPTT 68 sec, therapeutic at heparin  1400 units/hr. HL still falsely elevated >1.10 due to recent DOAC administration. Will monitor aPTT and heparin  levels until correlating.   CBC stable (Hgb 8.5, PLT decreased 299). Per RN, no issues with the heparin  gtt overnight and no concern for bleeding.    Goal of Therapy:  Heparin  level 66-102 units/ml aPTT 0.3-0.5 seconds Monitor platelets by anticoagulation protocol: Yes   Plan:  Continue heparin  1400 units/hr Monitor aPTT and anti-Xa level daily until correlating Monitor CBC and s/sx of bleeding daily F/u transition to Eliquis  when/if able   Vermell Mccallum, PharmD          [1]  Allergies Allergen Reactions   Metformin Anaphylaxis    Water  blisters between fingers   Hydrocodone  Hives and Nausea And Vomiting

## 2025-01-04 NOTE — Plan of Care (Signed)
 Patient comfort measures only. Will discontinue LTM and be available as needed.

## 2025-01-04 NOTE — Procedures (Signed)
 Patient Name: Ryan [name]  MRN: [id number]  Epilepsy Attending: Arlin MALVA Krebs  Referring Physician/Provider: Olena Lamar PARAS, MD  Duration: 12/22/2024 0935 to 01/22/25 0935  Patient history: 60yo M s/p cardiac arrest. EEG to evaluate for seizure.  Level of alertness: comatose  AEDs during EEG study: Propofol , LEV  Technical aspects: This EEG study was done with scalp electrodes positioned according to the 10-20 International system of electrode placement. Electrical activity was reviewed with band pass filter of 1-70Hz , sensitivity of 7 uV/mm, display speed of 26mm/sec with a 60Hz  notched filter applied as appropriate. EEG data were recorded continuously and digitally stored.  Video monitoring was available and reviewed as appropriate.  Description: At the beginning of the study, EEG showed burst attenuation pattern with epileptiform bursts alternating with 0.5 to 3 seconds of generalized EEG attenuation.  Gradually as propofol  was increased and Keppra  was added, bursts evolved into generalized periodic epileptiform discharges at 1 to 2 Hz admixed with 3 to 5 Hz theta-delta slowing.  Brief 1 to 2 seconds of generalized EEG attenuation was also noted.  Hyperventilation and photic stimulation were not performed.   Event button was pressed on 12/22/2024 at 1056 for jerking movements.  Concomitant EEG showed concomitant highly epileptiform bursts consistent with myoclonic seizures  ABNORMALITY - Myoclonic seizure, generalized - Burst attenuation with highly epileptiform bursts, generalized  IMPRESSION: This study showed evidence of epileptogenicity with generalized onset and increased risk of seizure recurrence.  Additionally there is evidence of severe to profound diffuse encephalopathy.  Event button was placed on 12/12/2024 at 1056 with jerking movements consistent with myoclonic seizures.  With history of cardiac arrest and the EEG pattern, this is concerning for anoxic/hypoxic brain  injury.   Lakyn Alsteen O Shariff Lasky

## 2025-01-04 NOTE — Progress Notes (Signed)
 Patient ID: Ryan [name], male   DOB: 1966-09-09, 59 y.o.   MRN: [id number] [facility] Progress Note   Assessment/ Plan:   1. Acute kidney Injury: Oliguric again overnight with 1.1 L urine output.  Etiology of acute injury is likely ischemic ATN in the setting of cardiac arrest/bradycardia.  Continue CRRT at current prescription with adjustment of UF goal to keep even with what appears to be evidence of possible renal recovery/escalating urine output.  If labs permit, may be able to discontinue CRRT in the next 24 hours. 2.  Hyponatremia: Secondary to acute kidney injury/impaired free water  handling.  Improving on CRRT/volume unloading. 3.  Status post PEA cardiac arrest/bradycardia: With prolonged downtime before ROSC with high likelihood of significant hypoxic brain injury based on neurology assessment.  Plans for MRI later today. 4.  Ventilator dependent respiratory failure: Status postcardiac arrest, ventilator adjustments per CCM.  Subjective:   Tolerating CRRT without problems.  Improving urine output noted.   Objective:   BP (!) 106/50   Pulse 66   Temp 97.9 F (36.6 C)   Resp (!) 22   Ht 5' 9 (1.753 m)   Wt 79.8 kg   SpO2 100%   BMI 25.98 kg/m   Intake/Output Summary (Last 24 hours) at Jan 16, 2025 0757 Last data filed at 01-16-25 0700 Gross per 24 hour  Intake 2160.28 ml  Output 6214 ml  Net -4053.72 ml   Weight change: 1.9 kg  Physical Exam: Gen: Intubated, unresponsive.  Family at bedside CVS: Pulse regular rhythm, normal rate, S1 and S2 normal Resp: Clear to auscultation bilaterally, no rales/rhonchi.  On ventilator Abd: Soft, obese, nontender, bowel sounds normal Ext: No lower extremity edema noted.  Imaging: EEG adult Result Date: 12/21/2024 Matthews Elida HERO, MD     12/22/2024  5:14 AM Routine EEG Report Ryan [name] is a 59 y.o. male with a history of recent cardiac arrest and now altered mental status who is undergoing an EEG to evaluate for  seizures. Report: This EEG was acquired with electrodes placed according to the International 10-20 electrode system (including Fp1, Fp2, F3, F4, C3, C4, P3, P4, O1, O2, T3, T4, T5, T6, A1, A2, Fz, Cz, Pz). The following electrodes were missing or displaced: none. This EEG was performed while patient was sedated on propofol  and fentanyl . The background was a burst suppression pattern, >90% suppressed, with very brief bursts <0.5 seconds consisting of low amplitude sharp waves. This background is not clearly reactive to stimulation. There was no clear waking rhythm nor sleep architecture. There was no focal slowing. There were no interictal epileptiform discharges outside of the bursts described above. There were no electrographic seizures identified. Impression and clinical correlation: This EEG was obtained comatose and sedated on propofol  and fentanyl  and is abnormal due to a burst suppression pattern indicating global cerebral dysfunction, medication effect, or both. No electrographic seizures were seen during this recording. If there is high clinical concern for seizures, recommend continuous video EEG monitoring. Elida Matthews, MD [facility] [phone] If 7pm- 7am, please page neurology on call as listed in AMION.   DG CHEST PORT 1 VIEW Result Date: 12/21/2024 EXAM: 1 VIEW XRAY OF THE CHEST 12/21/2024 08:23:00 AM COMPARISON: 12/20/2024 CLINICAL HISTORY: Encounter for central line placement [id number] FINDINGS: LINES, TUBES AND DEVICES: Right IJ central venous catheter in place with tip in mid SVC. Left PICC in place with tip at superior cavoatrial junction. Stable ETT and enteric tube in place. Right upper extremity PICC in place  with tip in axillary vein. Loop recorder device in left chest. LUNGS AND PLEURA: Stable diffuse interstitial opacities. Small bilateral pleural effusions right greater than left. Bibasilar atelectasis. SABRA No pneumothorax. HEART AND MEDIASTINUM: Post-CABG changes. Stable  cardiomegaly. BONES AND SOFT TISSUES: Unchanged  fractured  median sternotomy wires. No acute osseous abnormality. IMPRESSION: 1. Interval placement of right IJ catheter with tip in the projection of the SVC. No pneumothorax after catheter placement. 2. No change in bilateral pleural effusions and interstitial edema. Electronically signed by: Waddell Calk MD 12/21/2024 08:34 AM EST RP Workstation: [id number]    Labs: BMET Recent Labs  Lab 12/19/24 0301 12/19/24 1434 12/20/24 0230 12/20/24 1844 12/20/24 2015 12/21/24 0001 12/21/24 0142 12/21/24 0348 12/21/24 0434 12/21/24 0657 12/21/24 1600 12/22/24 0420 12/22/24 1527 01-05-25 0351  NA 132*   < > 127*   < > 129*   < > 126* 127* 126* 126* 129* 131* 133* 134*  K 2.9*   < > 3.7   < > 3.4*   < > 4.1 4.1 4.6 4.6 4.7 4.1 4.4 3.8  CL 87*   < > 86*   < > 88*  --  86*  --  86*  --  90* 94* 97* 98  CO2 33*   < > 30   < > 26  --  24  --  24  --  25 25 26 25   GLUCOSE 223*   < > 126*   < > 77  --  120*  --  131*  --  111* 78 75 90  BUN 48*   < > 44*   < > 43*  --  47*  --  47*  --  39* 30* 23* 20  CREATININE 1.50*   < > 1.61*   < > 1.82*  --  1.96*  --  2.00*  --  1.77* 1.42* 1.21 1.05  CALCIUM  8.5*   < > 8.5*   < > 8.7*  --  8.7*  --  8.9  --  8.6* 8.5* 8.4* 8.4*  PHOS 4.3  --  4.2  --   --   --   --   --  5.3*  --  4.5 3.3 3.2 2.5   < > = values in this interval not displayed.   CBC Recent Labs  Lab 12/20/24 2015 12/21/24 0001 12/21/24 0434 12/21/24 0657 12/22/24 0420 01-05-2025 0351  WBC 22.6*  --  23.7*  --  17.9* 18.0*  HGB 8.0*   < > 8.6* 10.9* 8.6* 8.5*  HCT 27.8*   < > 29.4* 32.0* 29.8* 29.8*  MCV 78.1*  --  77.2*  --  78.0* 80.3  PLT 274  --  326  --  350 299   < > = values in this interval not displayed.    Medications:     amiodarone   400 mg Per Tube Daily   arformoterol   15 mcg Nebulization BID   And   umeclidinium bromide   1 puff Inhalation Daily   bisacodyl   10 mg Rectal Once   Chlorhexidine  Gluconate Cloth  6  each Topical Daily   clopidogrel   75 mg Per Tube Daily   docusate  100 mg Per Tube Daily   ezetimibe   10 mg Per Tube Daily   feeding supplement (PROSource TF20)  60 mL Per Tube TID   insulin  aspart  0-15 Units Subcutaneous Q4H   lactulose   20 g Per Tube TID   levETIRAcetam   1,000 mg Intravenous  Q12H   multivitamin  1 tablet Per Tube QHS   mouth rinse  15 mL Mouth Rinse Q2H   pantoprazole  (PROTONIX ) IV  40 mg Intravenous QHS   polyethylene glycol  17 g Per Tube BID   senna  2 tablet Per Tube Daily   sodium chloride  flush  3 mL Intravenous Q12H   sodium chloride  HYPERTONIC  4 mL Nebulization Q6H   thiamine   100 mg Per Tube Daily    Gordy Blanch, MD 2025/01/01, 7:57 AM

## 2025-01-04 NOTE — Progress Notes (Signed)
 Patient extubated to comfort care per order. RN and family at bedside.

## 2025-01-04 NOTE — Progress Notes (Signed)
 NEUROLOGY CONSULT FOLLOW UP NOTE   Date of service: 2025/01/04 Patient Name: Ryan [name] MRN:  [id number] DOB:  03-03-1966  Interval Hx/subjective  Patient seen and examined. Daughter at bedside Continues on propofol  Off of propofol  has eyelid fluttering and a myoclonic pattern  Vitals   Vitals:   04-Jan-2025 0830 Jan 04, 2025 0845 04-Jan-2025 0900 Jan 04, 2025 0915  BP:      Pulse:  74 77 81  Resp: (!) 22 (!) 25 18 20   Temp: 97.9 F (36.6 C) 97.9 F (36.6 C) 98.1 F (36.7 C) 98.2 F (36.8 C)  TempSrc:      SpO2:  100% 100% 100%  Weight:      Height:         Body mass index is 25.98 kg/m.  Physical Exam  General: Sedated intubated-sedation held for exam HEENT: Normocephalic atraumatic Lungs: Vented Neurological exam Sedated intubated with sedation held for exam In about 5 minutes after holding propofol , he had rhythmic eyelid fluttering. No other spontaneous movements noted Does not open eyes to voice Does not open eyes to noxious stimulation Cranial nerves: Pupils are sluggishly reactive, equal bilaterally, corneal reflexes are extremely weak in the right eye, unable to elicit in the left eye.  Face appears grossly symmetric Motor examination: No spontaneous movement.  No movement to extremities Sensory exam as above  Medications Current Medications[1]  Labs and Diagnostic Imaging   CBC:  Recent Labs  Lab 12/22/24 0420 01-04-2025 0351  WBC 17.9* 18.0*  HGB 8.6* 8.5*  HCT 29.8* 29.8*  MCV 78.0* 80.3  PLT 350 299    Basic Metabolic Panel:  Lab Results  Component Value Date   NA 134 (L) 2025/01/04   K 3.8 01/04/25   CO2 25 2025-01-04   GLUCOSE 90 01/04/2025   BUN 20 01/04/25   CREATININE 1.05 01-04-25   CALCIUM  8.4 (L) January 04, 2025   GFRNONAA >60 01-04-2025   GFRAA 87 06/18/2020   Lipid Panel:  Lab Results  Component Value Date   LDLCALC 25 03/02/2024   HgbA1c:  Lab Results  Component Value Date   HGBA1C 12.3 (H) 07/18/2024    INR  Lab  Results  Component Value Date   INR 3.6 (H) 12/20/2024   APTT  Lab Results  Component Value Date   APTT 68 (H) 01/04/2025    Personally reviewed-CT head 12/20/2024-no acute findings  Overnight EEG with myoclonic seizures multiple times.  Assessment   Ryan [name] is a 59 y.o. male with past history of hypertension, hyperlipidemia, diabetes, CKD stage IIIa now requiring CRRT, LV thrombus, coronary artery disease status post CABG, congestive heart failure, chronic respiratory failure with hypoxia who is being evaluated for shortness of breath and edema and while inpatient on 12/20/2024, went into cardiac arrest.  Approximately 19 minutes of downtime prior to ROSC.  With lowering of sedation, started having myoclonic appearing movement of the abdominal muscles and abdominal jerking concerning for myoclonic seizures.  LTM was obtained that showed highly epileptiform bursts.  Neurology was consulted for further recommendations. Exam concerning for postanoxic injury and postanoxic myoclonic seizures/magnetic status epilepticus which was resolved with sedation.  Discussed with the daughter in detail the possibility that he has sustained hypoxic/anoxic brain injury given the clinical findings, EEG findings and the prolonged timeframe history.  The daughter is very clear that the patient does not want any life-prolonging measures if there is no chance of neurologically meaningful recovery.  In someone with the comorbidities that he has prior to the cardiac  arrest, and then a prolonged downtime cardiac arrest, with the clinical exam and EEG findings/this does not portend him good chances of neurologically meaningful recovery although formal prognostication can only be done in 72 hours. The daughter expressed desire to have more discussions with the primary team and likely withdrawal of care if there is no scope for neurologically meaningful recovery which I think is not unreasonable.  Impression:  Hypoxic/anoxic brain injury status postcardiac arrest, myoclonic seizures/status epilepticus  Recommendations  I have discussed the case in detail with Dr. Olena, PCCM attending.  He will have a conversation with the family shortly. Continue Keppra  1000 twice daily Hold propofol  and restart if he starts to have clinical seizures. Strong suspicion for potentially irreversible neurological injury and at this time, with him having expressed a strong desire not to be kept alive only on machines, his premorbid comorbidities and the family being on board with that, goals of care conversations and possible withdrawal of care may not be unreasonable although usually neuro prognostication is reserved after 72 hours.   Answered all of the questions that the daughter had to the best of my ability. Discussed the case in detail with Dr. Olena ______________________________________________________________________   Signed, Eligio Lav, MD [facility]   CRITICAL CARE ATTESTATION Performed by: Eligio Lav, MD Total critical care time: 38 minutes Critical care time was exclusive of separately billable procedures and treating other patients and/or supervising APPs/Residents/Students Critical care was necessary to treat or prevent imminent or life-threatening deterioration. This patient is critically ill and at significant risk for neurological worsening and/or death and care requires constant monitoring. Critical care was time spent personally by me on the following activities: development of treatment plan with patient and/or surrogate as well as nursing, discussions with consultants, evaluation of patient's response to treatment, examination of patient, obtaining history from patient or surrogate, ordering and performing treatments and interventions, ordering and review of laboratory studies, ordering and review of radiographic studies, pulse oximetry, re-evaluation of patient's condition,  participation in multidisciplinary rounds and medical decision making of high complexity in the care of this patient.     [1]  Current Facility-Administered Medications:    acetaminophen  (TYLENOL ) tablet 650 mg, 650 mg, Oral, Q6H PRN **OR** acetaminophen  (TYLENOL ) suppository 650 mg, 650 mg, Rectal, Q6H PRN, Melvin, Alexander B, MD   albuterol  (PROVENTIL ) (2.5 MG/3ML) 0.083% nebulizer solution 2.5 mg, 2.5 mg, Inhalation, Q6H PRN, Melvin, Alexander B, MD   amiodarone  (PACERONE ) tablet 400 mg, 400 mg, Per Tube, Daily, Zenaida Morene PARAS, MD, 400 mg at 01/06/25 9042   arformoterol  (BROVANA ) nebulizer solution 15 mcg, 15 mcg, Nebulization, BID, 15 mcg at 01/06/2025 0754 **AND** umeclidinium bromide  (INCRUSE ELLIPTA ) 62.5 MCG/ACT 1 puff, 1 puff, Inhalation, Daily, Melvin, Alexander B, MD, 1 puff at 12/20/24 9257   bisacodyl  (DULCOLAX) suppository 10 mg, 10 mg, Rectal, Once, Hayes Lander L, NP   Chlorhexidine  Gluconate Cloth 2 % PADS 6 each, 6 each, Topical, Daily, Fairy Frames, MD, 6 each at 2025-01-06 0949   clopidogrel  (PLAVIX ) tablet 75 mg, 75 mg, Per Tube, Daily, Alghanim, Fahid, MD, 75 mg at January 06, 2025 0957   docusate (COLACE) 50 MG/5ML liquid 100 mg, 100 mg, Per Tube, Daily, Alghanim, Fahid, MD, 100 mg at 12/22/24 9047   EPINEPHrine  (ADRENALIN ) 5 mg in NS 250 mL (0.02 mg/mL) premix infusion, 0.5-20 mcg/min, Intravenous, Titrated, Stretch, Lamar PARAS, MD, Last Rate: 24 mL/hr at 01-06-2025 0900, 8 mcg/min at 2025/01/06 0900   ezetimibe  (ZETIA ) tablet 10 mg, 10 mg, Per  Tube, Daily, Alghanim, Fahid, MD, 10 mg at 07-Jan-2025 0957   feeding supplement (PROSource TF20) liquid 60 mL, 60 mL, Per Tube, TID, Stretch, Lamar PARAS, MD, 60 mL at 12/22/24 2158   feeding supplement (VITAL 1.5 CAL) liquid 1,000 mL, 1,000 mL, Per Tube, Continuous, Stretch, Lamar PARAS, MD, Stopped at 12/22/24 2030   fentaNYL  (SUBLIMAZE ) injection 50 mcg, 50 mcg, Intravenous, Q15 min PRN, Stretch, Lamar PARAS, MD, 50 mcg at 12/22/24 2115   fentaNYL   in NS (63mcg/ml) infusion-PREMIX, 0-400 mcg/hr, Intravenous, Continuous, Gleason, [name], PA-C, Last Rate: 2.5 mL/hr at 07-Jan-2025 0900, 25 mcg/hr at 01/07/2025 0900   heparin  ADULT infusion 100 units/mL (25000 units/250mL), 1,400 Units/hr, Intravenous, Continuous, Bensimhon, Toribio SAUNDERS, MD, Last Rate: 14 mL/hr at 01-07-2025 0900, 1,400 Units/hr at 01-07-2025 0900   heparin  injection 1,000-6,000 Units, 1,000-6,000 Units, CRRT, PRN, Marlee Bernardino NOVAK, MD   insulin  aspart (novoLOG ) injection 0-15 Units, 0-15 Units, Subcutaneous, Q4H, Stretch, Lamar PARAS, MD   lactulose  (CHRONULAC ) 10 GM/15ML solution 20 g, 20 g, Per Tube, TID, Hayes Lander L, NP   levETIRAcetam  (KEPPRA ) undiluted injection 1,000 mg, 1,000 mg, Intravenous, Q12H, Shelton, Priyanka O, MD, 1,000 mg at January 07, 2025 0957   midazolam  PF (VERSED ) injection 2 mg, 2 mg, Intravenous, Q15 min PRN, Stretch, Lamar PARAS, MD, 2 mg at 12/21/24 0506   mineral oil-hydrophilic petrolatum  (AQUAPHOR) ointment 1 Application, 1 Application, Topical, Daily PRN, Marcine, Brittainy M, PA-C   multivitamin (RENA-VIT) tablet 1 tablet, 1 tablet, Per Tube, QHS, Stretch, Lamar PARAS, MD, 1 tablet at 12/22/24 2158   Oral care mouth rinse, 15 mL, Mouth Rinse, Q2H, Stretch, Lamar PARAS, MD, 15 mL at 01/07/2025 9048   Oral care mouth rinse, 15 mL, Mouth Rinse, PRN, Stretch, Lamar PARAS, MD   oxyCODONE -acetaminophen  (PERCOCET/ROXICET) 5-325 MG per tablet 1 tablet, 1 tablet, Oral, Q6H PRN, 1 tablet at 12/20/24 1042 **AND** oxyCODONE  (Oxy IR/ROXICODONE ) immediate release tablet 5 mg, 5 mg, Oral, Q6H PRN, Fairy Frames, MD, 5 mg at 12/20/24 1042   pantoprazole  (PROTONIX ) injection 40 mg, 40 mg, Intravenous, QHS, Bensimhon, Daniel R, MD, 40 mg at 12/22/24 2158   piperacillin -tazobactam (ZOSYN ) IVPB 3.375 g, 3.375 g, Intravenous, Q6H, Claudene Chew C, NP, Stopped at 2025/01/07 0533   polyethylene glycol (MIRALAX  / GLYCOLAX ) packet 17 g, 17 g, Per Tube, BID, Alghanim, Fahid, MD, 17 g at 12/22/24  2158   prismasol  BGK 4/2.5 infusion, , CRRT, Continuous, Marlee Bernardino B, MD, Last Rate: 400 mL/hr at 12/22/24 2307, New Bag at 12/22/24 2307   prismasol  BGK 4/2.5 infusion, , CRRT, Continuous, Marlee Bernardino B, MD, Last Rate: 400 mL/hr at 12/22/24 2307, New Bag at 12/22/24 2307   prismasol  BGK 4/2.5 infusion, , CRRT, Continuous, Marlee Bernardino B, MD, Last Rate: 1,500 mL/hr at 2025/01/07 0506, New Bag at 01/07/25 0506   propofol  (DIPRIVAN ) 1000 MG/100ML infusion, 0-80 mcg/kg/min, Intravenous, Titrated, Gleason, [name], PA-C, Last Rate: 18.9 mL/hr at 01-07-25 0943, 30 mcg/kg/min at 01-07-25 0943   senna (SENOKOT) tablet 17.2 mg, 2 tablet, Per Tube, Daily, Hayes Lander L, NP   sodium chloride  0.9 % primer fluid for CRRT, , CRRT, PRN, Marlee Bernardino B, MD   sodium chloride  flush (NS) 0.9 % injection 10-40 mL, 10-40 mL, Intracatheter, PRN, Fairy Frames, MD, 10 mL at 12/19/24 2204   sodium chloride  flush (NS) 0.9 % injection 3 mL, 3 mL, Intravenous, Q12H, Melvin, Alexander B, MD, 3 mL at 2025/01/07 0950   sodium chloride  HYPERTONIC 3 % nebulizer solution 4  mL, 4 mL, Nebulization, Q6H, Stretch, Lamar PARAS, MD, 4 mL at January 11, 2025 0754   thiamine  (VITAMIN B1) tablet 100 mg, 100 mg, Per Tube, Daily, Stretch, Lamar PARAS, MD, 100 mg at 12/22/24 1059

## 2025-01-04 NOTE — Procedures (Signed)
 Patient Name: Ruston Fedora  MRN: [id number]  Epilepsy Attending: Arlin MALVA Krebs  Referring Physician/Provider: Olena Lamar PARAS, MD  Duration: 12/27/24 0935 to 27-Dec-2024 1208   Patient history: 59yo M s/p cardiac arrest. EEG to evaluate for seizure.   Level of alertness: comatose   AEDs during EEG study: Propofol , LEV   Technical aspects: This EEG study was done with scalp electrodes positioned according to the 10-20 International system of electrode placement. Electrical activity was reviewed with band pass filter of 1-70Hz , sensitivity of 7 uV/mm, display speed of 98mm/sec with a 60Hz  notched filter applied as appropriate. EEG data were recorded continuously and digitally stored.  Video monitoring was available and reviewed as appropriate.   Description: EEG showed generalized periodic epileptiform discharges at 1 to 2 Hz admixed with 3 to 5 Hz theta-delta slowing.  Brief 1 to 2 seconds of generalized EEG attenuation was also noted.  Hyperventilation and photic stimulation were not performed.    ABNORMALITY - Periodic epileptiform discharges, generalized - Background attenuation, generalized   IMPRESSION: This study showed evidence of epileptogenicity with generalized onset and increased risk of seizure recurrence. Additionally there is evidence of severe to profound diffuse encephalopathy.    With history of cardiac arrest and the EEG pattern, this is concerning for anoxic/hypoxic brain injury.     Penni Penado O Creig Landin

## 2025-01-04 NOTE — Progress Notes (Signed)
 Orthopedic Tech Progress Note Patient Details:  Ryan [name] 21-Feb-1966 [id number]  We are currently out of UNNA PASTE  Patient ID: Ryan [name], male   DOB: June 24, 1966, 59 y.o.   MRN: [id number]  Delanna LITTIE Pac 12/25/24, 7:59 AM

## 2025-01-04 NOTE — Progress Notes (Signed)
 PT Cancellation Note  Patient Details Name: Ryan [name] MRN: [id number] DOB: [birth year]   Cancelled Treatment:    Reason Eval/Treat Not Completed: Patient not medically ready or appropriate at this time. After discussion with RN will sign off at this time and await new consult as appropriate.   Izetta Call, PT, DPT   Acute Rehabilitation Department Office [phone] Secure Chat Communication Preferred   [name] 2024/12/30, 8:59 AM

## 2025-01-04 NOTE — Progress Notes (Signed)
 Nutrition Brief Note  Chart reviewed. Pt now transitioning to comfort care.  No further nutrition interventions planned at this time.  Please re-consult as needed.   Romelle Starcher MS, RDN, LDN, CNSC Registered Dietitian 3 Clinical Nutrition RD Inpatient Contact Info in Amion

## 2025-01-04 NOTE — Death Summary Note (Addendum)
 "  DEATH SUMMARY   Patient Details  Name: Ryan [name] MRN: [id number] DOB: Apr 22, 1966 ERE:Jcalzmz, Aliene, MD  Admission/Discharge Information   Admit Date:  Dec 27, 2024  Date of Death: Date of Death: 01/01/2025  Time of Death: Time of Death: 01-15-02  Length of Stay: 5   Principle Cause of death: Acute on Chronic Systolic Heart Failure  Hospital Diagnoses: Principal Problem:   Acute on chronic combined systolic and diastolic CHF (congestive heart failure) (HCC) Active Problems:   CAD (coronary artery disease)   Type 2 diabetes mellitus with complication, with long-term current use of insulin  (HCC)   PAD (peripheral artery disease)   Carotid artery disease   PVD (peripheral vascular disease)   Essential hypertension   S/P CABG x 3   Centrilobular emphysema (HCC)   Paroxysmal atrial fibrillation (HCC)   Chronic kidney disease, stage 3a (HCC)   Obesity, class 1   Iron  deficiency anemia   Chronic respiratory failure with hypoxia (HCC)   Hyperlipidemia   Cardiac arrest (HCC)   Malnutrition of moderate degree   Hospital Course: 65 M with severe biventricular systolic heart failure who was admitted 27-Dec-2024 with acute on chronic systolic heart failure and severe volume overload. He had a PEA arrest on 1/17 -> 19 mins of CPR. Did not regain consciousness following ROSC. Did not respond to diuretic challenge post-ROSC so he was started on CRRT on 1/18. Observed to have myoclonic seizures clinically (and confirmed on cvEEG). Did not respond to Keppra  loading. Following goals of care conversation with the patient's daughter (next-of-kin), the patient was transitioned to comfort measures on 01-01-2025 with daughter and other family at bedside.       Procedures:  Endotracheal intubation (12/20/2024) Arterial line insertion (12/20/2024) Central line insertion (12/20/2024) Hemodialysis catheter insertion (12/21/2024)   Consultations: Nephrology; Advanced Heart Failure, Critical Care  Medicine   The results of significant diagnostics from this hospitalization (including imaging, microbiology, ancillary and laboratory) are listed below for reference.   Significant Diagnostic Studies: Overnight EEG with video Result Date: 01-01-2025 Shelton Arlin KIDD, MD     2025/01/01 11:18 AM Patient Name: Ryan [name] MRN: [id number] Epilepsy Attending: Arlin KIDD Shelton Referring Physician/Provider: Olena Lamar PARAS, MD Duration: 12/22/2024 0935 to January 01, 2025 0935 Patient history: 59yo M s/p cardiac arrest. EEG to evaluate for seizure. Level of alertness: comatose AEDs during EEG study: Propofol , LEV Technical aspects: This EEG study was done with scalp electrodes positioned according to the 10-20 International system of electrode placement. Electrical activity was reviewed with band pass filter of 1-70Hz , sensitivity of 7 uV/mm, display speed of 51mm/sec with a 60Hz  notched filter applied as appropriate. EEG data were recorded continuously and digitally stored.  Video monitoring was available and reviewed as appropriate. Description: At the beginning of the study, EEG showed burst attenuation pattern with epileptiform bursts alternating with 0.5 to 3 seconds of generalized EEG attenuation.  Gradually as propofol  was increased and Keppra  was added, bursts evolved into generalized periodic epileptiform discharges at 1 to 2 Hz admixed with 3 to 5 Hz theta-delta slowing.  Brief 1 to 2 seconds of generalized EEG attenuation was also noted.  Hyperventilation and photic stimulation were not performed. Event button was pressed on 12/22/2024 at 1056 for jerking movements.  Concomitant EEG showed concomitant highly epileptiform bursts consistent with myoclonic seizures ABNORMALITY - Myoclonic seizure, generalized - Burst attenuation with highly epileptiform bursts, generalized IMPRESSION: This study showed evidence of epileptogenicity with generalized onset and increased risk of seizure recurrence.  Additionally there  is evidence of severe to profound diffuse encephalopathy. Event button was placed on 12/12/2024 at 1056 with jerking movements consistent with myoclonic seizures. With history of cardiac arrest and the EEG pattern, this is concerning for anoxic/hypoxic brain injury. Arlin MALVA Krebs   EEG adult Result Date: 12/21/2024 Matthews Elida HERO, MD     12/22/2024  5:14 AM Routine EEG Report Ryan [name] is a 59 y.o. male with a history of recent cardiac arrest and now altered mental status who is undergoing an EEG to evaluate for seizures. Report: This EEG was acquired with electrodes placed according to the International 10-20 electrode system (including Fp1, Fp2, F3, F4, C3, C4, P3, P4, O1, O2, T3, T4, T5, T6, A1, A2, Fz, Cz, Pz). The following electrodes were missing or displaced: none. This EEG was performed while patient was sedated on propofol  and fentanyl . The background was a burst suppression pattern, >90% suppressed, with very brief bursts <0.5 seconds consisting of low amplitude sharp waves. This background is not clearly reactive to stimulation. There was no clear waking rhythm nor sleep architecture. There was no focal slowing. There were no interictal epileptiform discharges outside of the bursts described above. There were no electrographic seizures identified. Impression and clinical correlation: This EEG was obtained comatose and sedated on propofol  and fentanyl  and is abnormal due to a burst suppression pattern indicating global cerebral dysfunction, medication effect, or both. No electrographic seizures were seen during this recording. If there is high clinical concern for seizures, recommend continuous video EEG monitoring. Elida Matthews, MD [facility] [phone] If 7pm- 7am, please page neurology on call as listed in AMION.   DG CHEST PORT 1 VIEW Result Date: 12/21/2024 EXAM: 1 VIEW XRAY OF THE CHEST 12/21/2024 08:23:00 AM COMPARISON: 12/20/2024 CLINICAL HISTORY: Encounter for central  line placement [id number] FINDINGS: LINES, TUBES AND DEVICES: Right IJ central venous catheter in place with tip in mid SVC. Left PICC in place with tip at superior cavoatrial junction. Stable ETT and enteric tube in place. Right upper extremity PICC in place with tip in axillary vein. Loop recorder device in left chest. LUNGS AND PLEURA: Stable diffuse interstitial opacities. Small bilateral pleural effusions right greater than left. Bibasilar atelectasis. SABRA No pneumothorax. HEART AND MEDIASTINUM: Post-CABG changes. Stable cardiomegaly. BONES AND SOFT TISSUES: Unchanged  fractured  median sternotomy wires. No acute osseous abnormality. IMPRESSION: 1. Interval placement of right IJ catheter with tip in the projection of the SVC. No pneumothorax after catheter placement. 2. No change in bilateral pleural effusions and interstitial edema. Electronically signed by: Waddell Calk MD 12/21/2024 08:34 AM EST RP Workstation: [id number]   US  Abdomen Limited Result Date: 12/21/2024 EXAM: LIMITED ABDOMINAL ULTRASOUND FOR ASCITES EVALUATION TECHNIQUE: Limited real-time sonography of all 4 quadrants of the abdomen was performed for evaluation of ascites. COMPARISON: None available. CLINICAL HISTORY: Ascites. FINDINGS: RIGHT UPPER QUADRANT: Ascites is visualized. LEFT UPPER QUADRANT: Ascites is visualized. RIGHT LOWER QUADRANT: Ascites is visualized. LEFT LOWER QUADRANT: Ascites is visualized. The largest accumulation of fluid is noted in the left lower quadrant. OTHER: The contour of the liver appears slightly nodular. Normal parenchymal echogenicity without focal lesion. Incidental note made of a right pleural effusion. The findings of ascites and a nodular liver are most consistent with cirrhosis. Further evaluation of the liver and the cause of ascites is recommended. IMPRESSION: 1. Ascites, largest accumulation in the left lower quadrant. If there is known cirrhosis, then these findings may reflect underlying portal venous  hypertension. Alternative etiologies include chf and  renal failure. 2. Slightly nodular liver contour, suggestive of cirrhosis. If there is a clinical concern for cirrhosis and portal venous hypertension, dedicated liver imaging is recommended including liver ultrasound with hepatic elastography or liver protocol MRI with contrast material. Electronically signed by: Waddell Calk MD 12/21/2024 05:43 AM EST RP Workstation: [id number]   CT HEAD WO CONTRAST ( ) Result Date: 12/20/2024 EXAM: CT HEAD WITHOUT CONTRAST 12/20/2024 11:29:04 PM TECHNIQUE: CT of the head was performed without the administration of intravenous contrast. Automated exposure control, iterative reconstruction, and/or weight based adjustment of the mA/kV was utilized to reduce the radiation dose to as low as reasonably achievable. COMPARISON: 11/13/2024 CLINICAL HISTORY: AMS, recent CODE, now ROSC AMS, recent CODE, now ROSC AMS, recent CODE, now ROSC FINDINGS: BRAIN AND VENTRICLES: No acute hemorrhage. No evidence of acute infarct. No hydrocephalus. No extra-axial collection. No mass effect or midline shift. Atherosclerosis of skullbase vasculature. ORBITS: No acute abnormality. SINUSES: Remote nasal bone fractures, unchanged. SOFT TISSUES AND SKULL: Endotracheal and orogastric tubes partially included. No acute soft tissue abnormality. No skull fracture. IMPRESSION: 1. No acute intracranial abnormality. Electronically signed by: Franky Stanford MD 12/20/2024 11:35 PM EST RP Workstation: [id number]   DG CHEST PORT 1 VIEW Result Date: 12/20/2024 EXAM: 1 VIEW(S) XRAY OF THE CHEST 12/20/2024 08:20:00 PM COMPARISON: 12/18/2024 CLINICAL HISTORY: Hypoxia. FINDINGS: LINES, TUBES AND DEVICES: Loop recorder device over left chest. Left upper PICC in place with distal tip at superior junction level. Right central venous catheter is noted over the mid subclavian vein withdrawn when compared with the prior exam. ET tube in place 3.0 cm above carina.  Enteric tube in place coursing below diaphragm with distal tip beyond inferior film margin. LUNGS AND PLEURA: Stable pulmonary edema is noted. Stable moderate right pleural effusion with right basilar opacity. Small left pleural effusion. No pneumothorax. HEART AND MEDIASTINUM: Post-surgical changes from prior CABG. Aortic atherosclerosis. Stable cardiomegaly. BONES AND SOFT TISSUES: No acute osseous abnormality. IMPRESSION: 1. Stable pulmonary edema. 2. Stable moderate right pleural effusion with right basilar opacity. 3. Small left pleural effusion. Electronically signed by: Oneil Devonshire MD 12/20/2024 08:32 PM EST RP Workstation: [id number]   ECHOCARDIOGRAM LIMITED Result Date: 12/20/2024    ECHOCARDIOGRAM LIMITED REPORT   Patient Name:   Ryan [name] Date of Exam: 12/20/2024 Medical Rec #:  [id number]    Height:       69.0 in Accession #:    [id number]   Weight:       231.5 lb Date of Birth:  Sep 13, 1966   BSA:          2.198 m Patient Age:    58 years     BP:           110/60 mmHg Patient Gender: M            HR:           77 bpm. Exam Location:  Inpatient Procedure: Limited Echo, Intracardiac Opacification Agent and Color Doppler            (Both Spectral and Color Flow Doppler were utilized during            procedure). Indications:    CHF- Acute Systolic I50.21  History:        Patient has prior history of Echocardiogram examinations, most                 recent 12/20/2023. CHF and LV Thrombus, CAD; Risk  Factors:Diabetes, Hypertension and Dyslipidemia.  Sonographer:    Koleen Popper RDCS Referring Phys: FAIRY FRAMES  Sonographer Comments: Image acquisition challenging due to patient body habitus. IMPRESSIONS  1. No apical thrombus on contrast imaging. Left ventricular ejection fraction, by estimation, is 35 to 40%. The left ventricle has moderately decreased function. The left ventricle demonstrates regional wall motion abnormalities (see scoring diagram/findings for description).  2. Right  ventricular systolic function is severely reduced. The right ventricular size is severely enlarged.  3. The aortic valve was not well visualized. Aortic valve regurgitation is mild.  4. The inferior vena cava is dilated in size with <50% respiratory variability, suggesting right atrial pressure of 15 mmHg. FINDINGS  Left Ventricle: No apical thrombus on contrast imaging. Left ventricular ejection fraction, by estimation, is 35 to 40%. The left ventricle has moderately decreased function. The left ventricle demonstrates regional wall motion abnormalities. Definity  contrast agent was given IV to delineate the left ventricular endocardial borders. The left ventricular internal cavity size was normal in size. There is no left ventricular hypertrophy.  LV Wall Scoring: The apical septal segment and apex are akinetic. Right Ventricle: The right ventricular size is severely enlarged. No increase in right ventricular wall thickness. Right ventricular systolic function is severely reduced. Aortic Valve: The aortic valve was not well visualized. Aortic valve regurgitation is mild. Venous: The inferior vena cava is dilated in size with less than 50% respiratory variability, suggesting right atrial pressure of 15 mmHg. Additional Comments: Color Doppler performed.  LEFT VENTRICLE PLAX 2D LVIDd:         5.30 cm LVIDs:         3.75 cm LV PW:         1.00 cm LV IVS:        0.90 cm  LV Volumes (MOD) LV vol d, MOD A4C: 125.0 ml LV vol s, MOD A4C: 77.3 ml LV SV MOD A4C:     125.0 ml IVC IVC diam: 2.60 cm LEFT ATRIUM         Index LA diam:    5.10 cm 2.32 cm/m Darryle Decent MD Electronically signed by Darryle Decent MD Signature Date/Time: 12/20/2024/12:54:58 PM    Final    ECHOCARDIOGRAM COMPLETE Result Date: 12/19/2024    ECHOCARDIOGRAM REPORT   Patient Name:   Ryan [name] Date of Exam: 12/19/2024 Medical Rec #:  [id number]    Height:       69.0 in Accession #:    [id number]   Weight:       226.0 lb Date of Birth:  26-Feb-1966    BSA:          2.176 m Patient Age:    58 years     BP:           116/87 mmHg Patient Gender: M            HR:           85 bpm. Exam Location:  Inpatient Procedure: 2D Echo, Cardiac Doppler and Color Doppler (Both Spectral and Color            Flow Doppler were utilized during procedure). Indications:    CHF- Acute Systolic  History:        Patient has prior history of Echocardiogram examinations, most                 recent 07/18/2024. CHF and Cardiomyopathy, CAD; Risk  Factors:Hypertension, Diabetes and Dyslipidemia.  Sonographer:    Sherlean Dubin Referring Phys: [id number] ALEXANDER B MELVIN IMPRESSIONS  1. [place] acoustic windows limit study There is hypokinesis/akinesis of the distal and apical segments of LV Cannot exclude apical thrombus (frame 69) Would recommend limited echo to Definity  to further define. . Left ventricular ejection fraction, by estimation, is 40 to 45%. The left ventricle has mildly decreased function. Left ventricular diastolic parameters are consistent with Grade II diastolic dysfunction (pseudonormalization).  2. Right ventricular systolic function is moderately reduced. The right ventricular size is mildly enlarged.  3. Right atrial size was mildly dilated.  4. Mild mitral valve regurgitation.  5. The aortic valve is tricuspid. Aortic valve regurgitation is mild. Aortic valve sclerosis/calcification is present, without any evidence of aortic stenosis.  6. The inferior vena cava is dilated in size with <50% respiratory variability, suggesting right atrial pressure of 15 mmHg. FINDINGS  Left Ventricle: [place] acoustic windows limit study There is hypokinesis/akinesis of the distal and apical segments of LV Cannot exclude apical thrombus (frame 69) Would recommend limited echo to Definity  to further define. Left ventricular ejection fraction, by estimation, is 40 to 45%. The left ventricle has mildly decreased function. The left ventricular internal cavity size was normal in  size. There is no left ventricular hypertrophy. Left ventricular diastolic parameters are consistent with Grade II diastolic dysfunction (pseudonormalization). Right Ventricle: The right ventricular size is mildly enlarged. Right vetricular wall thickness was not assessed. Right ventricular systolic function is moderately reduced. Left Atrium: Left atrial size was normal in size. Right Atrium: Right atrial size was mildly dilated. Pericardium: Trivial pericardial effusion is present. Mitral Valve: There is mild thickening of the mitral valve leaflet(s). Mild mitral annular calcification. Mild mitral valve regurgitation. MV peak gradient, 4.5 mmHg. The mean mitral valve gradient is 1.0 mmHg. Tricuspid Valve: The tricuspid valve is normal in structure. Tricuspid valve regurgitation is mild. Aortic Valve: The aortic valve is tricuspid. Aortic valve regurgitation is mild. Aortic valve sclerosis/calcification is present, without any evidence of aortic stenosis. Aortic valve mean gradient measures 5.5 mmHg. Aortic valve peak gradient measures 10.2 mmHg. Aortic valve area, by VTI measures 1.49 cm. Pulmonic Valve: The pulmonic valve was normal in structure. Pulmonic valve regurgitation is mild. Aorta: The aortic root and ascending aorta are structurally normal, with no evidence of dilitation. Venous: The inferior vena cava is dilated in size with less than 50% respiratory variability, suggesting right atrial pressure of 15 mmHg. IAS/Shunts: No atrial level shunt detected by color flow Doppler.  LEFT VENTRICLE PLAX 2D LVIDd:         5.30 cm   Diastology LVIDs:         4.00 cm   LV e' medial:    5.37 cm/s LV PW:         0.90 cm   LV E/e' medial:  18.5 LV IVS:        1.00 cm   LV e' lateral:   9.38 cm/s LVOT diam:     1.80 cm   LV E/e' lateral: 10.6 LV SV:         42 LV SV Index:   19 LVOT Area:     2.54 cm  RIGHT VENTRICLE            IVC RV Basal diam:  4.00 cm    IVC diam: 2.30 cm RV Mid diam:    3.60 cm RV S prime:      4.56 cm/s TAPSE (M-mode):  1.2 cm LEFT ATRIUM             Index        RIGHT ATRIUM           Index LA diam:        4.60 cm 2.11 cm/m   RA Area:     17.30 cm LA Vol (A2C):   69.1 ml 31.76 ml/m  RA Volume:   45.80 ml  21.05 ml/m LA Vol (A4C):   47.7 ml 21.92 ml/m LA Biplane Vol: 57.1 ml 26.24 ml/m  AORTIC VALVE AV Area (Vmax):    1.42 cm AV Area (Vmean):   1.43 cm AV Area (VTI):     1.49 cm AV Vmax:           160.00 cm/s AV Vmean:          105.000 cm/s AV VTI:            0.284 m AV Peak Grad:      10.2 mmHg AV Mean Grad:      5.5 mmHg LVOT Vmax:         89.10 cm/s LVOT Vmean:        58.950 cm/s LVOT VTI:          0.166 m LVOT/AV VTI ratio: 0.59  AORTA Ao Root diam: 2.70 cm Ao Asc diam:  3.00 cm MITRAL VALVE               TRICUSPID VALVE MV Area (PHT): 6.71 cm    TR Peak grad:   25.8 mmHg MV Area VTI:   1.39 cm    TR Vmax:        254.00 cm/s MV Peak grad:  4.5 mmHg MV Mean grad:  1.0 mmHg    SHUNTS MV Vmax:       1.06 m/s    Systemic VTI:  0.17 m MV Vmean:      54.9 cm/s   Systemic Diam: 1.80 cm MV Decel Time: 113 msec MR Peak grad: 12.3 mmHg MR Vmax:      175.50 cm/s MV E velocity: 99.50 cm/s MV A velocity: 35.10 cm/s MV E/A ratio:  2.83 Vina Gull MD Electronically signed by Vina Gull MD Signature Date/Time: 12/19/2024/5:19:54 PM    Final    DG Chest 2 View Result Date: 12/18/2024 CLINICAL DATA:  Shortness of breath EXAM: DG CHEST 2V COMPARISON:  11/17/2024 FINDINGS: Sternotomy and left-sided recording device. Cardiomegaly with small left and moderate right pleural effusions. Airspace disease at right base. Diffuse interstitial and ground-glass opacity in the lungs, probably due to pulmonary edema IMPRESSION: Cardiomegaly with small left and moderate right pleural effusions. Diffuse interstitial and ground-glass opacity in the lungs, probably due to pulmonary edema. Electronically Signed   By: Luke Bun M.D.   On: 12/18/2024 21:02   DG CHEST PORT 1 VIEW Result Date: 12/18/2024 CLINICAL DATA:   Line EXAM: PORTABLE CHEST 1 VIEW COMPARISON:  12/18/2024, 11/17/2024, 03/13/2023 FINDINGS: Post sternotomy changes with chronic sternal wire fractures. Electronic recording device over the chest wall. Cardiomegaly with moderate right-sided slightly loculated effusion and small left effusion. Diffuse increased interstitial and ground-glass opacity, likely due to mild edema. Consolidation at the right lung base. Right upper extremity central venous catheter courses cranially over the right neck, the tip is non included on the image. IMPRESSION: 1. Right upper extremity central venous catheter courses cranially over the right neck, the tip is non included on the image. Recommend repositioning. 2. Cardiomegaly with  moderate right and small left effusions. Diffuse increased interstitial and ground-glass opacity, likely due to mild edema. Consolidation at the right lung base. These results will be called to the ordering clinician or representative by the Radiologist Assistant, and communication documented in the PACS or [employer]. Electronically Signed   By: Luke Bun M.D.   On: 12/18/2024 21:01   US  EKG SITE RITE Result Date: 12/18/2024 If Site Rite image not attached, placement could not be confirmed due to current cardiac rhythm.  DG Chest 2 View Result Date: 12/18/2024 EXAM: 2 VIEW(S) XRAY OF THE CHEST 12/18/2024 01:31:00 PM COMPARISON: 11/17/2024 CLINICAL HISTORY: sob FINDINGS: LINES, TUBES AND DEVICES: Left chest loop recorder noted. LUNGS AND PLEURA: Mild pulmonary edema. Moderate right pleural effusion with adjacent right basilar atelectasis. Trace left pleural effusion. No pneumothorax. HEART AND MEDIASTINUM: Stable cardiomediastinal silhouette with post-sternotomy and CABG changes. Similar cardiomegaly. BONES AND SOFT TISSUES: Similar discontinuity of sternotomy wires. IMPRESSION: 1. Mild pulmonary edema with moderate right pleural effusion and adjacent right basilar atelectasis. 2. Trace left  pleural effusion. Electronically signed by: Waddell Calk MD 12/18/2024 01:51 PM EST RP Workstation: [id number]   CUP PACEART REMOTE DEVICE CHECK Result Date: 12/05/2024 ILR summary report received. Battery status OK. Normal device function. No new tachy, brady, or pause episodes. No new AF episodes. Monthly summary reports and ROV/PRN 1 symptom activation, no associated arrhythmia, SR with PVC's LA, CVRS   Microbiology: Recent Results (from the past 240 hours)  Resp panel by RT-PCR (RSV, Flu A&B, Covid) Anterior Nasal Swab     Status: None   Collection Time: 12/18/24 12:35 PM   Specimen: Anterior Nasal Swab  Result Value Ref Range Status   SARS Coronavirus 2 by RT PCR NEGATIVE NEGATIVE Final   Influenza A by PCR NEGATIVE NEGATIVE Final   Influenza B by PCR NEGATIVE NEGATIVE Final    Comment: (NOTE) The Xpert Xpress SARS-CoV-2/FLU/RSV plus assay is intended as an aid in the diagnosis of influenza from Nasopharyngeal swab specimens and should not be used as a sole basis for treatment. Nasal washings and aspirates are unacceptable for Xpert Xpress SARS-CoV-2/FLU/RSV testing.  Fact Sheet for Patients: bloggercourse.com  Fact Sheet for Healthcare Providers: seriousbroker.it  This test is not yet approved or cleared by the [place]  FDA and has been authorized for detection and/or diagnosis of SARS-CoV-2 by FDA under an Emergency Use Authorization (EUA). This EUA will remain in effect (meaning this test can be used) for the duration of the COVID-19 declaration under Section 564(b)(1) of the Act, 21 U.S.C. section 360bbb-3(b)(1), unless the authorization is terminated or revoked.     Resp Syncytial Virus by PCR NEGATIVE NEGATIVE Final    Comment: (NOTE) Fact Sheet for Patients: bloggercourse.com  Fact Sheet for Healthcare Providers: seriousbroker.it  This test is not yet  approved or cleared by the [place]  FDA and has been authorized for detection and/or diagnosis of SARS-CoV-2 by FDA under an Emergency Use Authorization (EUA). This EUA will remain in effect (meaning this test can be used) for the duration of the COVID-19 declaration under Section 564(b)(1) of the Act, 21 U.S.C. section 360bbb-3(b)(1), unless the authorization is terminated or revoked.  Performed at [facility], [address], [place], [place] [address]   Culture, blood (Routine X 2) w Reflex to ID Panel     Status: None   Collection Time: 12/21/24  5:22 AM   Specimen: BLOOD  Result Value Ref Range Status   Specimen Description BLOOD  SITE NOT SPECIFIED  Final   Special Requests   Final    BOTTLES DRAWN AEROBIC AND ANAEROBIC Blood Culture results may not be optimal due to an inadequate volume of blood received in culture bottles   Culture   Final    NO GROWTH 5 DAYS Performed at [facility], [address], [place], [place] [address]    Report Status 12/26/2024 FINAL  Final  Culture, blood (Routine X 2) w Reflex to ID Panel     Status: None   Collection Time: 12/21/24  5:24 AM   Specimen: BLOOD  Result Value Ref Range Status   Specimen Description BLOOD SITE NOT SPECIFIED  Final   Special Requests   Final    BOTTLES DRAWN AEROBIC AND ANAEROBIC Blood Culture results may not be optimal due to an inadequate volume of blood received in culture bottles   Culture   Final    NO GROWTH 5 DAYS Performed at [facility], [address] [place], [place] [address]    Report Status 12/26/2024 FINAL  Final  MRSA Next Gen by PCR, Nasal     Status: None   Collection Time: 12/22/24  1:02 AM   Specimen: Nasal Mucosa; Nasal Swab  Result Value Ref Range Status   MRSA by PCR Next Gen NOT DETECTED NOT DETECTED Final    Comment: (NOTE) The GeneXpert MRSA Assay (FDA approved for NASAL specimens only), is one component of a comprehensive MRSA colonization  surveillance program. It is not intended to diagnose MRSA infection nor to guide or monitor treatment for MRSA infections. Test performance is not FDA approved in patients less than 82 years old. Performed at [facility], [address] [place], [place] [address]     Signed: Lamar JINNY Dales, MD 12/27/24   "

## 2025-01-04 NOTE — Progress Notes (Signed)
 RN called and they are withdrawing care. Request ASAP DC LTM EEG.  LTM EEG discontinued - no skin breakdown at [facility].

## 2025-01-04 DEATH — deceased

## 2025-01-12 ENCOUNTER — Encounter

## 2025-01-15 ENCOUNTER — Encounter

## 2025-01-29 ENCOUNTER — Ambulatory Visit

## 2025-02-10 ENCOUNTER — Ambulatory Visit: Admitting: Pulmonary Disease

## 2025-02-24 ENCOUNTER — Encounter

## 2025-02-24 ENCOUNTER — Ambulatory Visit: Admitting: Pulmonary Disease
